# Patient Record
Sex: Female | Born: 1990 | Race: Black or African American | Hispanic: No | Marital: Single | Smoking: Current some day smoker
Health system: Southern US, Community
[De-identification: ages and names within clinical notes are randomized; demographics above are authoritative.]

## PROBLEM LIST (undated history)

## (undated) ENCOUNTER — Inpatient Hospital Stay (HOSPITAL_COMMUNITY): Payer: Self-pay

## (undated) DIAGNOSIS — N186 End stage renal disease: Secondary | ICD-10-CM

## (undated) DIAGNOSIS — E109 Type 1 diabetes mellitus without complications: Secondary | ICD-10-CM

## (undated) DIAGNOSIS — E7800 Pure hypercholesterolemia, unspecified: Secondary | ICD-10-CM

## (undated) DIAGNOSIS — B009 Herpesviral infection, unspecified: Secondary | ICD-10-CM

## (undated) DIAGNOSIS — G629 Polyneuropathy, unspecified: Secondary | ICD-10-CM

## (undated) DIAGNOSIS — O1490 Unspecified pre-eclampsia, unspecified trimester: Secondary | ICD-10-CM

## (undated) DIAGNOSIS — A64 Unspecified sexually transmitted disease: Secondary | ICD-10-CM

## (undated) HISTORY — DX: Herpesviral infection, unspecified: B00.9

## (undated) HISTORY — PX: WISDOM TOOTH EXTRACTION: SHX21

---
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
## 2010-12-20 ENCOUNTER — Encounter: Payer: Self-pay | Admitting: Emergency Medicine

## 2010-12-20 ENCOUNTER — Emergency Department (HOSPITAL_BASED_OUTPATIENT_CLINIC_OR_DEPARTMENT_OTHER)
Admission: EM | Admit: 2010-12-20 | Discharge: 2010-12-20 | Disposition: A | Discharge status: Other Acute Inpatient Hospital | Payer: Medicaid Other | Attending: Emergency Medicine | Admitting: Emergency Medicine

## 2010-12-20 ENCOUNTER — Inpatient Hospital Stay (HOSPITAL_COMMUNITY)
Admission: AD | Admit: 2010-12-20 | Discharge: 2010-12-22 | DRG: 639 | Disposition: A | Discharge status: Home / Self Care | Payer: Medicaid Other | Source: Other Acute Inpatient Hospital | Attending: Internal Medicine | Admitting: Internal Medicine

## 2010-12-20 DIAGNOSIS — Z5987 Material hardship due to limited financial resources, not elsewhere classified: Secondary | ICD-10-CM

## 2010-12-20 DIAGNOSIS — Z9119 Patient's noncompliance with other medical treatment and regimen: Secondary | ICD-10-CM

## 2010-12-20 DIAGNOSIS — E1010 Type 1 diabetes mellitus with ketoacidosis without coma: Principal | ICD-10-CM | POA: Diagnosis present

## 2010-12-20 DIAGNOSIS — Z91199 Patient's noncompliance with other medical treatment and regimen due to unspecified reason: Secondary | ICD-10-CM

## 2010-12-20 DIAGNOSIS — Z7901 Long term (current) use of anticoagulants: Secondary | ICD-10-CM

## 2010-12-20 DIAGNOSIS — R7401 Elevation of levels of liver transaminase levels: Secondary | ICD-10-CM | POA: Diagnosis present

## 2010-12-20 DIAGNOSIS — R110 Nausea: Secondary | ICD-10-CM | POA: Insufficient documentation

## 2010-12-20 DIAGNOSIS — E876 Hypokalemia: Secondary | ICD-10-CM | POA: Diagnosis not present

## 2010-12-20 DIAGNOSIS — E1110 Type 2 diabetes mellitus with ketoacidosis without coma: Secondary | ICD-10-CM | POA: Insufficient documentation

## 2010-12-20 DIAGNOSIS — R197 Diarrhea, unspecified: Secondary | ICD-10-CM | POA: Insufficient documentation

## 2010-12-20 DIAGNOSIS — Z598 Other problems related to housing and economic circumstances: Secondary | ICD-10-CM

## 2010-12-20 DIAGNOSIS — R7402 Elevation of levels of lactic acid dehydrogenase (LDH): Secondary | ICD-10-CM | POA: Diagnosis present

## 2010-12-20 DIAGNOSIS — R109 Unspecified abdominal pain: Secondary | ICD-10-CM | POA: Insufficient documentation

## 2010-12-20 HISTORY — DX: Type 1 diabetes mellitus without complications: E10.9

## 2010-12-20 LAB — URINE MICROSCOPIC-ADD ON

## 2010-12-20 LAB — BASIC METABOLIC PANEL
BUN: 18 mg/dL (ref 6–23)
BUN: 15 mg/dL (ref 6–23)
CO2: 15 mEq/L — ABNORMAL LOW (ref 19–32)
Calcium: 10.2 mg/dL (ref 8.4–10.5)
Calcium: 9.5 mg/dL (ref 8.4–10.5)
Calcium: 9 mg/dL (ref 8.4–10.5)
Creatinine, Ser: 0.9 mg/dL (ref 0.50–1.10)
GFR calc Af Amer: >60 mL/min (ref >60)
GFR calc Af Amer: >60 mL/min (ref >60)
GFR calc non Af Amer: >60 mL/min (ref >60)
GFR calc non Af Amer: >60 mL/min (ref >60)
GFR calc non Af Amer: >60 mL/min (ref >60)
Glucose, Bld: 137 mg/dL — ABNORMAL HIGH (ref 70–99)
Glucose, Bld: 214 mg/dL — ABNORMAL HIGH (ref 70–99)
Glucose, Bld: 183 mg/dL — ABNORMAL HIGH (ref 70–99)
Potassium: 4.3 mEq/L (ref 3.5–5.1)
Potassium: 3.5 mEq/L (ref 3.5–5.1)
Sodium: 132 mEq/L — ABNORMAL LOW (ref 135–145)
Sodium: 131 mEq/L — ABNORMAL LOW (ref 135–145)

## 2010-12-20 LAB — COMPREHENSIVE METABOLIC PANEL
ALT: 56 U/L — ABNORMAL HIGH (ref 0–35)
AST: 108 U/L — ABNORMAL HIGH (ref 0–37)
Albumin: 4.2 g/dL (ref 3.5–5.2)
CO2: 14 mEq/L — ABNORMAL LOW (ref 19–32)
Calcium: 10.8 mg/dL — ABNORMAL HIGH (ref 8.4–10.5)
Chloride: 92 mEq/L — ABNORMAL LOW (ref 96–112)
Creatinine, Ser: 1.1 mg/dL (ref 0.50–1.10)
GFR calc non Af Amer: >60 mL/min (ref >60)
Sodium: 134 mEq/L — ABNORMAL LOW (ref 135–145)

## 2010-12-20 LAB — URINALYSIS, ROUTINE W REFLEX MICROSCOPIC
Glucose, UA: >1000 mg/dL — AB
Ketones, ur: >80 mg/dL — AB
Nitrite: NEGATIVE
Specific Gravity, Urine: 1.029 (ref 1.005–1.030)
pH: 5 (ref 5.0–8.0)

## 2010-12-20 LAB — CBC
HCT: 45.9 % (ref 36.0–46.0)
MCH: 32.1 pg (ref 26.0–34.0)
MCV: 87.3 fL (ref 78.0–100.0)
Platelets: 344 10*3/uL (ref 150–400)
RBC: 5.26 MIL/uL — ABNORMAL HIGH (ref 3.87–5.11)
WBC: 7.9 10*3/uL (ref 4.0–10.5)

## 2010-12-20 LAB — MAGNESIUM: Magnesium: 1.7 mg/dL (ref 1.5–2.5)

## 2010-12-20 LAB — PHOSPHORUS
Phosphorus: 3 mg/dL (ref 2.3–4.6)
Phosphorus: 2.4 mg/dL (ref 2.3–4.6)

## 2010-12-20 LAB — GLUCOSE, CAPILLARY
Glucose-Capillary: 216 mg/dL — ABNORMAL HIGH (ref 70–99)
Glucose-Capillary: 196 mg/dL — ABNORMAL HIGH (ref 70–99)

## 2010-12-20 MED ORDER — SODIUM CHLORIDE 0.9 % IV BOLUS (SEPSIS)
1000.0000 mL | Freq: Once | INTRAVENOUS | Status: AC
  Administered 2010-12-20: 1000 mL via INTRAVENOUS

## 2010-12-20 MED ORDER — INSULIN REGULAR HUMAN 100 UNIT/ML IJ SOLN
INTRAMUSCULAR | Status: DC
  Filled 2010-12-20: qty 3

## 2010-12-20 NOTE — H&P (Signed)
Ann Robertson, Ann Robertson             ACCOUNT NO.:  [id number]  MEDICAL RECORD NO.:  [id number]  LOCATION:  [id number]                         FACILITY:  [facility]  PHYSICIAN:  Duncan Dull, MD     DATE OF BIRTH:  February 21, 1991  DATE OF ADMISSION:  12/20/2010 DATE OF DISCHARGE:                             HISTORY & PHYSICAL   CHIEF COMPLAINT:  Diabetic ketoacidosis.  HISTORY OF PRESENT ILLNESS:  This is a 20 year old African American woman with a history of diabetes diagnosed 12 years ago who presents today with elevated glucose and acidosis.  She says that due to financial issues, she is unable to consistently afford her insulin so she does not take it exactly as prescribed and her last dose had been at 2 in the afternoon yesterday, at which point she had not eaten anything afterwards.  She did miss her basal dose at night.  She woke up this morning with 10/10 pain in her stomach, feeling very sleepy.  She checked her blood sugar at that time, it was 599.  She is not sure what it tends to run because she does not check it very often.  She took 20 units of insulin and it came down to 445.  At the urging of her family member, she came into the hospital for further evaluation.  She does report a history of hospitalizations for DKA in the past although she said generally she is not this ill.  She says that she does think she might have a yeast infection, but that otherwise she has been feeling well prior to yesterday and denies any sick contacts.  When she came into the emergency room at [facility], the patient was started on a glucose stabilizer and given 2 L of normal saline.  Her tachycardia did improve and blood sugar came down in the less than 200 range.  The glucose stabilizer was stopped before she was transferred over.  It has risen up to about 257 on admission.  REVIEW OF SYSTEMS:  System review otherwise is negative except for that it hurts her stomach when she breathes  deeply.  PAST MEDICAL HISTORY:  Type 1 diabetes.  A C-section.  SOCIAL HISTORY:  The patient denies any drug or alcohol or tobacco use. She lives with her son.  She is not currently working.  FAMILY HISTORY:  Her mother has hypertension, diabetes, high cholesterol and has suffered an MI, stroke and aneurysm.  She has a brother with type 1 diabetes.  ALLERGIES:  She is allergic to some pain medication, that she cannot remember the name of, that gives her hives.  MEDICATIONS:  40 units of Lantus at bedtime, 12 units of Humalog at breakfast, 15 at lunch, and 15 at dinner, and as needed she takes a sliding scale of 0 and 7.  PHYSICAL EXAM:  VITAL SIGNS:  Not recorded at the outside hospital.  On presentation, she had a temperature of 98.5, pulse 146, blood pressure 138/94, respiratory rate 20, saturating 100% on room air. GENERAL:  This is a thin but well-nourished, well-developed, overall well-appearing young woman in no acute distress. HEENT:  Pupils equal, round, reactive to light.  Extraocular movements are intact.  She  has no conjunctivitis or scleral icterus.  She has moist mucous membranes.  No thrush. LUNGS:  Clear to auscultation bilaterally with only moderate respiratory effort. CARDIOVASCULAR:  Regular rate and rhythm, mildly tachycardic.  No murmurs, rubs or gallops. ABDOMEN:  Soft, nontender, nondistended.  Normal bowel sounds. EXTREMITIES:  Warm, well-perfused.  No cyanosis, clubbing or edema.  She has a bilateral calf tenderness which she says is chronic. NEUROLOGIC:  Grossly intact.  No focal deficits. SKIN:  No rash or skin breakdown.  LABORATORY DATA:  Most recent sodium 134, potassium 4.3, glucose 137, bicarb 19, BUN 18, creatinine 0.9 and calcium 10.2.  When she was admitted values of note were glucose 190, creatinine 1.1, and a calcium of 10.8.  CBC reveals white count 7.9, hemoglobin 16.9, hematocrit 45.9, platelets 344, phosphorus is 3.  UA was notable for  greater than a 1000 urine glucose, greater than 80 ketones, otherwise unremarkable so was her microscopic exam.  She was not pregnant.  Lipase was normal.  ASSESSMENT:  This is a 20 year old woman with a history of type 1 diabetes who presents with diabetic ketoacidosis in the context of noncompliance with her insulin regimen.  PLAN:  The patient will be back on a glucose stabilizer.  She still has an acidosis based on her bicarb of 19 and her glucose continues to rise off of the insulin pump.  We will check hemoglobin A1c to evaluate how her glucose management has been recently.  We will also check a VBG to make sure she is not profoundly acidotic.  She will continue on IV fluids.  Given that she is hungry and looks well and is not vomiting, she will be allowed to eat a carbohydrate-restricted diet.  We will arrange for her to meet with Social Work and the Diabetes Coordinator and hopefully something can be worked out so that she can afford to take her medication as she needs to as an outpatient.     Duncan Dull, MD     NB/MEDQ  D:  12/20/2010  T:  12/20/2010  Job:  [id number]  Electronically Signed by Duncan Dull M.D. on 12/20/2010 11:10:08 PM

## 2010-12-20 NOTE — ED Notes (Signed)
Pt states she has high blood sugar, 500's this am.  Pt is non-compliant with insulin regimen and diet.  Pt states she is trying to do better, but states she doesn't like taking her insulin.

## 2010-12-20 NOTE — ED Provider Notes (Signed)
History     Chief Complaint  Patient presents with  . Hyperglycemia  . Abdominal Pain  . Nausea   Patient is a 20 y.o. female presenting with abdominal pain. The history is provided by the patient.  Abdominal Pain The primary symptoms of the illness include abdominal pain and diarrhea. The primary symptoms of the illness do not include fever, shortness of breath, vomiting, dysuria, vaginal discharge or vaginal bleeding. The current episode started yesterday. The problem has not changed since onset. Symptoms associated with the illness do not include back pain. Significant associated medical issues include diabetes.  pt states hx iddm. In past day few diarrhea stools and occasional epigastric and mid abd cramping. No constant/focal abd pain. No fever or chills. States her blood sugars have been high. Did not take insulin yesterday. No vomiting. No cough or uri c/o. No headache. No dysuria or flank pain. No vaginal discharge or bleeding.   Past Medical History  Diagnosis Date  . Diabetes mellitus   . Juvenile diabetes mellitus   . Juvenile diabetes 2000    No past surgical history on file.  No family history on file.  History  Substance Use Topics  . Smoking status: Not on file  . Smokeless tobacco: Not on file  . Alcohol Use:     OB History    Grav Para Term Preterm Abortions TAB SAB Ect Mult Living                  Review of Systems  Constitutional: Negative for fever.  HENT: Negative for neck pain.   Eyes: Negative for redness.  Respiratory: Negative for cough and shortness of breath.   Cardiovascular: Negative for chest pain.  Gastrointestinal: Positive for abdominal pain and diarrhea. Negative for vomiting.  Genitourinary: Negative for dysuria, flank pain, vaginal bleeding and vaginal discharge.  Musculoskeletal: Negative for back pain.  Skin: Negative for rash.  Neurological: Negative for headaches.  Hematological: Does not bruise/bleed easily.    Psychiatric/Behavioral: Negative for behavioral problems.    Physical Exam  BP 138/94  Pulse 146  Temp(Src) 98.5 F (36.9 C) (Oral)  Resp 20  SpO2 100%  Physical Exam  Nursing note and vitals reviewed. Constitutional: She is oriented to person, place, and time.  HENT:  Head: Atraumatic.  Nose: Nose normal.  Mouth/Throat: Oropharynx is clear and moist.  Eyes: Conjunctivae are normal. Pupils are equal, round, and reactive to light. No scleral icterus.  Neck: Neck supple. No tracheal deviation present.       No stiffness or rigidiity  Cardiovascular: Intact distal pulses.  Exam reveals no gallop and no friction rub.   No murmur heard.      tachycardic  Pulmonary/Chest: Effort normal and breath sounds normal. No respiratory distress. She has no rales.  Abdominal: Soft. Normal appearance and bowel sounds are normal. She exhibits no distension and no mass. There is no tenderness. There is no rebound and no guarding.  Musculoskeletal: She exhibits no edema and no tenderness.  Neurological: She is alert and oriented to person, place, and time.  Skin: Skin is warm and dry. No rash noted. She is not diaphoretic.  Psychiatric: She has a normal mood and affect.    ED Course  Procedures  MDM Iv ns bolus. Labs.  Additional ivf. hco3 14. Glucose stabilizer/insulin gtt.  Recheck pt improved.  Discussed admit w pt. Pt agreeable, offered [facility]. Pt states lives in [place] and prefers [facility]. Discussed w [facility] at [facility] (  pts pcp w [facility], so unassigned in [place] system) - dr Deon Pilling accepts in transfer, requests step down bed.      Mirna Mires, MD 12/20/10 1245

## 2010-12-21 LAB — GLUCOSE, CAPILLARY
Glucose-Capillary: 324 mg/dL — ABNORMAL HIGH (ref 70–99)
Glucose-Capillary: 171 mg/dL — ABNORMAL HIGH (ref 70–99)
Glucose-Capillary: 179 mg/dL — ABNORMAL HIGH (ref 70–99)
Glucose-Capillary: 168 mg/dL — ABNORMAL HIGH (ref 70–99)
Glucose-Capillary: 148 mg/dL — ABNORMAL HIGH (ref 70–99)
Glucose-Capillary: 228 mg/dL — ABNORMAL HIGH (ref 70–99)
Glucose-Capillary: 114 mg/dL — ABNORMAL HIGH (ref 70–99)

## 2010-12-21 LAB — BASIC METABOLIC PANEL
BUN: 16 mg/dL (ref 6–23)
Chloride: 99 mEq/L (ref 96–112)
GFR calc Af Amer: >60 mL/min (ref >60)
GFR calc non Af Amer: >60 mL/min (ref >60)
Glucose, Bld: 124 mg/dL — ABNORMAL HIGH (ref 70–99)
Potassium: 3.6 mEq/L (ref 3.5–5.1)
Sodium: 132 mEq/L — ABNORMAL LOW (ref 135–145)

## 2010-12-22 LAB — CBC
HCT: 35.7 % — ABNORMAL LOW (ref 36.0–46.0)
Hemoglobin: 12.2 g/dL (ref 12.0–15.0)
MCH: 31.6 pg (ref 26.0–34.0)
MCHC: 34.2 g/dL (ref 30.0–36.0)
MCV: 92.5 fL (ref 78.0–100.0)
RBC: 3.86 MIL/uL — ABNORMAL LOW (ref 3.87–5.11)

## 2010-12-22 LAB — GLUCOSE, CAPILLARY
Glucose-Capillary: 45 mg/dL — ABNORMAL LOW (ref 70–99)
Glucose-Capillary: 195 mg/dL — ABNORMAL HIGH (ref 70–99)

## 2010-12-22 LAB — BASIC METABOLIC PANEL
BUN: 15 mg/dL (ref 6–23)
CO2: 23 mEq/L (ref 19–32)
Calcium: 8.6 mg/dL (ref 8.4–10.5)
Glucose, Bld: 118 mg/dL — ABNORMAL HIGH (ref 70–99)
Sodium: 138 mEq/L (ref 135–145)

## 2011-01-15 NOTE — Discharge Summary (Signed)
Ann Robertson, Ann Robertson NO.:  [id number]  MEDICAL RECORD NO.:  [id number]  LOCATION:  [id number]                         FACILITY:  [facility]  PHYSICIAN:  Julieta Bellini, MDDATE OF BIRTH:  June 09, 1990  DATE OF ADMISSION:  12/20/2010 DATE OF DISCHARGE:  12/22/2010                              DISCHARGE SUMMARY   ENDOCRINOLOGIST:  Dr. Peri Jefferson.  DISCHARGE DIAGNOSES: 1. Diabetes ketoacidosis with poor control, type 1 diabetes,     hemoglobin A1c 12.6. 2. Hypokalemia. 3. History of cesarean section. 4. Transaminitis.  DISCHARGE MEDICATIONS: 1. Multivitamins 1 tablet by mouth daily. 2. Humalog to use 15 three times a day as a meal coverage plus a     sliding scale insulin q.a.c. q.h.s., moderate same, no units if the     blood sugar is 7 to 120, 2 units if the blood sugar is 121 to 150,     3 units 151 to 200, 5 units 201 to 250, 8 units 251 to 300, 11     units 301 to 350 and 15 units  351 to 400 and above 400, she will     need to get in touch with Dr. Ronnald Ramp in order to receive further     recommendation and adjustment. 3. Lantus 35 units subcutaneously daily at bedtime.  DISPOSITION AND FOLLOWUP:  The patient had been discharged in stable and improved condition.  Currently not having any nausea, vomiting or abdominal pain.  She is tolerating p.o.'s pretty well and her blood sugars have been now stable.  The patient received instructions to follow a low carbohydrates and to take her medication as prescribed. She also had been now switched to [facility] in order to try to avoid not being able to get her medication due to financial issues and will follow with Dr. Ronnald Ramp on Thursday, July 26, at 8:40 a.m.  It will be important at that time to follow the patient's electrolytes to make sure that the potassium specifically continued to be in normal range after been repleted throughout this hospitalization and to have her blood sugar check and her insulin adjusted.   No procedures were performed during this hospitalization.  No consultations were made.  BRIEF HISTORY OF PRESENT ILLNESS:  The patient is a 20 year old African- American female with a history of diabetes type 1, diagnosed when she was 20 years old.  The patient came into the hospital with an elevated blood sugar acidosis and having some abdominal pain and nausea.  She reports 2 episodes of vomiting the day prior to admission and endorses that she had not been able to take her insulin due to financial problems.  For more details, please refer to dictation done by Dr. Duncan Dull on December 20, 2010.  LABORATORY DATA:  Pertinent laboratory data throughout this hospitalization include a CBC with differential on admission showing white blood cells of 7.9, hemoglobin 16.9, platelets 344,000.  A comprehensive metabolic panel with a sodium of 134, potassium 4.2, chloride 92, bicarb was 14.  Blood sugar was above 500 prior to being placed on glucose stabilizer.  Creatinine was 1.1, BUN 20.  Liver function tests demonstrated mild elevation of AST and ALT at 108 and  56, respectively.  Bilirubin 0.9, calcium 10.8, albumin 4.2, total protein 9.1.  Lipase was 11, phosphorus 3.0.  Urinary pregnancy test was negative.  Urinalysis and urine microscopy negative for infections. Magnesium 1.7.  MRSA PCR screening was negative.  Hemoglobin A1c 12.6. Multiple basic metabolic panels were done throughout her hospitalization while she was on a glucose stabilizer in order to adjust her blood sugar and electrolytes at discharge.  BMET demonstrated a sodium of 138, potassium 3.7, chloride 103, bicarb 23, glucose 118, BUN 15, creatinine 0.72, calcium 8.6.  CBC, white blood cells 4.0, hemoglobin 12.2, platelets 216,000.  HOSPITAL COURSE BY PROBLEM: 1. DKA due to uncontrolled diabetes secondary to noncompliance.  The     patient was admitted, placed on DKA protocol, following glucose     stabilizer and then  switched to long-acting insulin sliding scale     plus meal coverage.  Now that her blood sugar is better controlled,     she is going to be discharged with instructions to be compliant     with her medications with switch in pharmacy to try to help with     the financial issues, specifically her co-pay for the medications.     She had received a month supply of her insulin and she is going to     follow with Dr. Ronnald Ramp, endocrinologist, for further adjustment as     an outpatient.  She is no longer having any nausea, any vomiting.     She is tolerating p.o.'s without problem. 2. Hypokalemia secondary to the regimen of her DKA and also treatment.     She received repletion throughout this hospitalization and at     discharge, potassium was in the normal range. 3. Transaminitis.  Mild elevation of her liver function tests, most     likely also secondary to the DKA process.  The patient received     fluid resuscitation, not having any signs or symptoms that will     suggest any liver disease.  She will require LFTs to be checked up     as an outpatient. 4. Financial difficulties.  Avoiding medication compliance.  Case     manager/social worker got together with the patient and has switch     her to [facility] that can waive co-pay in order to try to make her     medication compliance better.  The patient has Medicaid and also     the prescription plan from Medicaid which made her not illegible     for indigent funds at the hospital.  At discharge, temperature     demonstrated to be 97.6, heart rate 84, respiratory rate 20, blood     pressure 120/79, oxygen saturation 99% on room air.  In general,     the patient in no acute distress.  Respiratory system is clear to     auscultation bilaterally.  Heart is regular rate and rhythm.  No     murmurs, gallops or rubs.  Abdomen is soft, nontender with positive     bowel sounds.  No distention.  Extremities without edema.  There is     a tattoo on  her right lower extremity.  Neurologic exam was     nonfocal.     Julieta Bellini, MD     CEM/MEDQ  D:  12/22/2010  T:  12/22/2010  Job:  [id number]  cc:   Dr. Peri Jefferson  Electronically Signed by Barton Dubois MD on 01/15/2011 05:42:55 PM

## 2011-05-20 ENCOUNTER — Encounter (HOSPITAL_BASED_OUTPATIENT_CLINIC_OR_DEPARTMENT_OTHER): Payer: Self-pay

## 2011-05-20 ENCOUNTER — Emergency Department (HOSPITAL_BASED_OUTPATIENT_CLINIC_OR_DEPARTMENT_OTHER)
Admission: EM | Admit: 2011-05-20 | Discharge: 2011-05-20 | Disposition: A | Discharge status: Home / Self Care | Payer: Medicaid Other | Attending: Emergency Medicine | Admitting: Emergency Medicine

## 2011-05-20 DIAGNOSIS — R21 Rash and other nonspecific skin eruption: Secondary | ICD-10-CM | POA: Insufficient documentation

## 2011-05-20 DIAGNOSIS — E119 Type 2 diabetes mellitus without complications: Secondary | ICD-10-CM | POA: Insufficient documentation

## 2011-05-20 DIAGNOSIS — N644 Mastodynia: Secondary | ICD-10-CM

## 2011-05-20 MED ORDER — OXYCODONE-ACETAMINOPHEN 5-325 MG PO TABS: 2.0000 | ORAL_TABLET | ORAL | Status: AC | PRN

## 2011-05-20 NOTE — ED Provider Notes (Signed)
History     CSN: [id number]  Arrival date & time 05/20/11  1702   First MD Initiated Contact with Patient 05/20/11 1757      Chief Complaint  Patient presents with  . Rash    (Consider location/radiation/quality/duration/timing/severity/associated sxs/prior treatment) Patient is a 20 y.o. female presenting with rash. The history is provided by the patient.  Rash  This is a new problem.   patient has had breast pain for last 3-4 months. She states that her breasts are also larger. She states it is not associated with her period. She states she now has a rash below the right breast. She states there goes like there is a fullness in the right breast. She states she saw her primary care Dr. who she says blew her off. No fevers. No nipple discharge. No weight loss. No cough. She states she can't be pregnant because she is having periods.  Past Medical History  Diagnosis Date  . Diabetes mellitus   . Juvenile diabetes mellitus   . Juvenile diabetes 2000    Past Surgical History  Procedure Date  . Cesarean section     No family history on file.  History  Substance Use Topics  . Smoking status: Never Smoker   . Smokeless tobacco: Not on file  . Alcohol Use: No    OB History    Grav Para Term Preterm Abortions TAB SAB Ect Mult Living                  Review of Systems  Respiratory: Negative for shortness of breath.   Cardiovascular: Negative for chest pain.  Genitourinary: Negative for pelvic pain.  Skin: Positive for rash.    Allergies  Review of patient's allergies indicates no known allergies.  Home Medications   Current Outpatient Rx  Name Route Sig Dispense Refill  . ACETAMINOPHEN 500 MG PO TABS Oral Take 1,000 mg by mouth every 6 (six) hours as needed. For pain     . INSULIN GLARGINE 100 UNIT/ML [place] SOLN Subcutaneous Inject 40 Units into the skin at bedtime.     . INSULIN LISPRO (HUMAN) 100 UNIT/ML  SOLN Subcutaneous Inject 3-13 Units into the skin 3  (three) times daily before meals. According to sliding scale: if blood sugar is 70-90, 3 units; 91-130, 5 units; 131-150, 6 units; 151-200, 7 units; 201-300, 9 units; 301-350, 10 units; 351-400, 11 units; 401-450, 12 units; greater than 450, 13 units.    . OXYCODONE-ACETAMINOPHEN 5-325 MG PO TABS Oral Take 2 tablets by mouth every 4 (four) hours as needed for pain. 15 tablet 0    BP 130/85  Pulse 83  Temp(Src) 99.4 F (37.4 C) (Oral)  Resp 16  Ht 5\' 5"  (1.651 m)  Wt 133 lb (60.328 kg)  BMI 22.13 kg/m2  SpO2 100%  LMP 05/19/2011  Physical Exam  Constitutional: She appears well-developed and well-nourished.  HENT:  Head: Normocephalic.  Eyes: Pupils are equal, round, and reactive to light.  Cardiovascular: Normal rate and regular rhythm.   Pulmonary/Chest: Effort normal and breath sounds normal.       Bilateral breast tenderness. Skin the left breast appears normal. No clear masses, although there is somewhat dense breast tissue. No nipple discharge. Skin on the right breast shows a couple small approximately 3 mm red areas inferiorly. There is fullness in the breast, worse on the right side. Mild tenderness in the right axilla, but no lymphadenopathy palpated.    ED Course  Procedures (including  critical care time)   Labs Reviewed  PREGNANCY, URINE   No results found.   1. Breast pain in female       MDM  Patient had breast pain for last 3-4 months. She states it hurts on both sides. There is somewhat of a fullness in the right breast. Little rash inferiorly. She states she saw her primary care doctor but wouldn't do anything and she was someone different. She's given information for the doctors in this building. She'll be discharged home for them to arrange for mammogram. She was given pain medications        Jasper Riling. Alvino Chapel, MD 05/20/11 [phone]

## 2011-05-20 NOTE — ED Notes (Signed)
Breast pain, lump to right breast x 3-4 months-rash to area today

## 2011-06-21 ENCOUNTER — Encounter (HOSPITAL_BASED_OUTPATIENT_CLINIC_OR_DEPARTMENT_OTHER): Payer: Self-pay | Admitting: Family Medicine

## 2011-06-21 ENCOUNTER — Emergency Department (HOSPITAL_BASED_OUTPATIENT_CLINIC_OR_DEPARTMENT_OTHER)
Admission: EM | Admit: 2011-06-21 | Discharge: 2011-06-21 | Disposition: A | Discharge status: Home / Self Care | Payer: Medicaid Other | Attending: Emergency Medicine | Admitting: Emergency Medicine

## 2011-06-21 DIAGNOSIS — F3289 Other specified depressive episodes: Secondary | ICD-10-CM | POA: Insufficient documentation

## 2011-06-21 DIAGNOSIS — E7800 Pure hypercholesterolemia, unspecified: Secondary | ICD-10-CM | POA: Insufficient documentation

## 2011-06-21 DIAGNOSIS — F329 Major depressive disorder, single episode, unspecified: Secondary | ICD-10-CM

## 2011-06-21 DIAGNOSIS — E119 Type 2 diabetes mellitus without complications: Secondary | ICD-10-CM | POA: Insufficient documentation

## 2011-06-21 HISTORY — DX: Pure hypercholesterolemia, unspecified: E78.00

## 2011-06-21 HISTORY — DX: Polyneuropathy, unspecified: G62.9

## 2011-06-21 LAB — CBC
HCT: 37.1 % (ref 36.0–46.0)
MCH: 30.4 pg (ref 26.0–34.0)
MCHC: 34.8 g/dL (ref 30.0–36.0)
MCV: 87.3 fL (ref 78.0–100.0)
Platelets: 312 10*3/uL (ref 150–400)
RDW: 11.2 % — ABNORMAL LOW (ref 11.5–15.5)
WBC: 7.4 10*3/uL (ref 4.0–10.5)

## 2011-06-21 LAB — DIFFERENTIAL
Basophils Absolute: 0 10*3/uL (ref 0.0–0.1)
Basophils Relative: 0 % (ref 0–1)
Eosinophils Absolute: 0.3 10*3/uL (ref 0.0–0.7)
Eosinophils Relative: 4 % (ref 0–5)
Lymphocytes Relative: 31 % (ref 12–46)
Monocytes Absolute: 0.4 10*3/uL (ref 0.1–1.0)

## 2011-06-21 LAB — URINALYSIS, ROUTINE W REFLEX MICROSCOPIC
Hgb urine dipstick: NEGATIVE
Nitrite: NEGATIVE
Specific Gravity, Urine: 1.04 — ABNORMAL HIGH (ref 1.005–1.030)
Urobilinogen, UA: 1 mg/dL (ref 0.0–1.0)
pH: 6 (ref 5.0–8.0)

## 2011-06-21 LAB — RAPID URINE DRUG SCREEN, HOSP PERFORMED
Benzodiazepines: NOT DETECTED
Opiates: NOT DETECTED

## 2011-06-21 LAB — PREGNANCY, URINE: Preg Test, Ur: NEGATIVE

## 2011-06-21 LAB — COMPREHENSIVE METABOLIC PANEL
ALT: 6 U/L (ref 0–35)
AST: 11 U/L (ref 0–37)
CO2: 27 mEq/L (ref 19–32)
Calcium: 9.1 mg/dL (ref 8.4–10.5)
Creatinine, Ser: 0.7 mg/dL (ref 0.50–1.10)
GFR calc non Af Amer: >90 mL/min (ref >90)
Sodium: 135 mEq/L (ref 135–145)
Total Protein: 7.3 g/dL (ref 6.0–8.3)

## 2011-06-21 LAB — ETHANOL: Alcohol, Ethyl (B): <11 mg/dL (ref 0–11)

## 2011-06-21 LAB — URINE MICROSCOPIC-ADD ON

## 2011-06-21 MED ORDER — IBUPROFEN 800 MG PO TABS
800.0000 mg | ORAL_TABLET | Freq: Once | ORAL | Status: AC
  Administered 2011-06-21: 800 mg via ORAL
  Filled 2011-06-21: qty 1

## 2011-06-21 MED ORDER — FLUOXETINE HCL 20 MG PO TABS: 20.0000 mg | ORAL_TABLET | Freq: Every day | ORAL | Status: DC

## 2011-06-21 NOTE — ED Notes (Signed)
Pt tearful and sts she has been feeling depressed 2-3 wks. Pt denies SI, HI and denies h/o same.

## 2011-06-21 NOTE — ED Provider Notes (Signed)
History   This chart was scribed for Ezequiel Essex, MD by Malen Gauze. The patient was seen in room MH05/MH05 and the patient's care was started at 7:00PM.    CSN: [id number]  Arrival date & time 06/21/11  1831   First MD Initiated Contact with Patient 06/21/11 1854      Chief Complaint  Patient presents with  . Depression    (Consider location/radiation/quality/duration/timing/severity/associated sxs/prior treatment) HPI Ann Robertson is a 21 y.o. female who presents to the Emergency Department complaining of persistent, severe depression with an onset 3 weeks ago. Female guardian from pt's church brought her in with pt's consent. Guardian states that pt called her crying and saying she was hopeless and depressed. Today, when guardian suggested going to the ED, pt immediately said yes to the idea, as long as she was not admitted and could go home afterwards. Pt has a nml appetite but has not slept or perform in nml daily activities. However, there is no suicidal or homicidal ideation, alcohol abuse, or meds taken for depression. Pt has a HA from crying. Pt is also diabetic and takes insulin shots. She is on a new medication (gabapentin) which pt claims is making her more depressed. Blood sugar levels are nml. Pt also has neuropathy, but no other pertinent medical problems.  Past Medical History  Diagnosis Date  . Diabetes mellitus   . Juvenile diabetes mellitus   . Juvenile diabetes 2000  . Neuropathy   . High cholesterol     Past Surgical History  Procedure Date  . Cesarean section     No family history on file.  History  Substance Use Topics  . Smoking status: Never Smoker   . Smokeless tobacco: Not on file  . Alcohol Use: No    Pt has a son in pre-school.  OB History    Grav Para Term Preterm Abortions TAB SAB Ect Mult Living                  Review of Systems 10 Systems reviewed and are negative for acute change except as noted in the HPI.  Allergies    Review of patient's allergies indicates no known allergies.  Home Medications   Current Outpatient Rx  Name Route Sig Dispense Refill  . ACETAMINOPHEN 500 MG PO TABS Oral Take 1,000 mg by mouth every 6 (six) hours as needed. For pain     . GABAPENTIN 300 MG PO CAPS Oral Take 300 mg by mouth 3 (three) times daily.    . INSULIN GLARGINE 100 UNIT/ML [place] SOLN Subcutaneous Inject 19 Units into the skin daily.     . INSULIN LISPRO (HUMAN) 100 UNIT/ML [place] SOLN Subcutaneous Inject 3-13 Units into the skin 3 (three) times daily before meals. According to sliding scale: if blood sugar is 70-90, 3 units; 91-130, 5 units; 131-150, 6 units; 151-200, 7 units; 201-300, 9 units; 301-350, 10 units; 351-400, 11 units; 401-450, 12 units; greater than 450, 13 units.    Marland Kitchen PRAVASTATIN SODIUM 20 MG PO TABS Oral Take 20 mg by mouth at bedtime.      BP 118/80  Pulse 93  Temp(Src) 98 F (36.7 C) (Oral)  Resp 16  SpO2 100%  LMP 06/21/2011  Physical Exam  Constitutional: She is oriented to person, place, and time. She appears well-developed and well-nourished.  HENT:  Head: Normocephalic and atraumatic.  Right Ear: External ear normal.  Left Ear: External ear normal.  Eyes: Conjunctivae and EOM are normal. Pupils  are equal, round, and reactive to light. No scleral icterus.  Neck: Normal range of motion. Neck supple. No thyromegaly present.  Cardiovascular: Normal rate, regular rhythm and normal heart sounds.  Exam reveals no gallop and no friction rub.   No murmur heard. Pulmonary/Chest: Effort normal and breath sounds normal. No stridor. She has no wheezes. She has no rales. She exhibits no tenderness.  Abdominal: Soft. She exhibits no distension. There is no tenderness. There is no rebound.  Musculoskeletal: Normal range of motion. She exhibits no edema.  Lymphadenopathy:    She has no cervical adenopathy.  Neurological: She is alert and oriented to person, place, and time. Coordination normal.  Skin:  Skin is warm and dry. No rash noted. No erythema.  Psychiatric: She is withdrawn. She exhibits a depressed mood. She expresses no homicidal and no suicidal ideation. She expresses no suicidal plans and no homicidal plans.    ED Course  Procedures (including critical care time) DIAGNOSTIC STUDIES: Oxygen Saturation is 100% on room air, normal by my interpretation.    COORDINATION OF CARE:  9:08PM - recheck; checking on status of pt  Results for orders placed during the hospital encounter of 06/21/11  CBC      Component Value Range   WBC 7.4  4.0 - 10.5 (K/uL)   RBC 4.25  3.87 - 5.11 (MIL/uL)   Hemoglobin 12.9  12.0 - 15.0 (g/dL)   HCT 37.1  36.0 - 46.0 (%)   MCV 87.3  78.0 - 100.0 (fL)   MCH 30.4  26.0 - 34.0 (pg)   MCHC 34.8  30.0 - 36.0 (g/dL)   RDW 11.2 (*) 11.5 - 15.5 (%)   Platelets 312  150 - 400 (K/uL)  DIFFERENTIAL      Component Value Range   Neutrophils Relative 59  43 - 77 (%)   Neutro Abs 4.4  1.7 - 7.7 (K/uL)   Lymphocytes Relative 31  12 - 46 (%)   Lymphs Abs 2.3  0.7 - 4.0 (K/uL)   Monocytes Relative 5  3 - 12 (%)   Monocytes Absolute 0.4  0.1 - 1.0 (K/uL)   Eosinophils Relative 4  0 - 5 (%)   Eosinophils Absolute 0.3  0.0 - 0.7 (K/uL)   Basophils Relative 0  0 - 1 (%)   Basophils Absolute 0.0  0.0 - 0.1 (K/uL)  COMPREHENSIVE METABOLIC PANEL      Component Value Range   Sodium 135  135 - 145 (mEq/L)   Potassium 4.5  3.5 - 5.1 (mEq/L)   Chloride 100  96 - 112 (mEq/L)   CO2 27  19 - 32 (mEq/L)   Glucose, Bld 342 (*) 70 - 99 (mg/dL)   BUN 12  6 - 23 (mg/dL)   Creatinine, Ser 0.70  0.50 - 1.10 (mg/dL)   Calcium 9.1  8.4 - 10.5 (mg/dL)   Total Protein 7.3  6.0 - 8.3 (g/dL)   Albumin 3.8  3.5 - 5.2 (g/dL)   AST 11  0 - 37 (U/L)   ALT 6  0 - 35 (U/L)   Alkaline Phosphatase 78  39 - 117 (U/L)   Total Bilirubin 0.4  0.3 - 1.2 (mg/dL)   GFR calc non Af Amer >90  >90 (mL/min)   GFR calc Af Amer >90  >90 (mL/min)  URINALYSIS, ROUTINE W REFLEX MICROSCOPIC       Component Value Range   Color, Urine YELLOW  YELLOW    APPearance CLEAR  CLEAR  Specific Gravity, Urine 1.040 (*) 1.005 - 1.030    pH 6.0  5.0 - 8.0    Glucose, UA >1000 (*) NEGATIVE (mg/dL)   Hgb urine dipstick NEGATIVE  NEGATIVE    Bilirubin Urine NEGATIVE  NEGATIVE    Ketones, ur NEGATIVE  NEGATIVE (mg/dL)   Protein, ur NEGATIVE  NEGATIVE (mg/dL)   Urobilinogen, UA 1.0  0.0 - 1.0 (mg/dL)   Nitrite NEGATIVE  NEGATIVE    Leukocytes, UA NEGATIVE  NEGATIVE   PREGNANCY, URINE      Component Value Range   Preg Test, Ur NEGATIVE    ETHANOL      Component Value Range   Alcohol, Ethyl (B) <11  0 - 11 (mg/dL)  URINE RAPID DRUG SCREEN (HOSP PERFORMED)      Component Value Range   Opiates NONE DETECTED  NONE DETECTED    Cocaine NONE DETECTED  NONE DETECTED    Benzodiazepines NONE DETECTED  NONE DETECTED    Amphetamines NONE DETECTED  NONE DETECTED    Tetrahydrocannabinol NONE DETECTED  NONE DETECTED    Barbiturates NONE DETECTED  NONE DETECTED   URINE MICROSCOPIC-ADD ON      Component Value Range   Squamous Epithelial / LPF RARE  RARE    Bacteria, UA RARE  RARE       No results found.   No diagnosis found.    MDM  3 weeks of depression, tearfulness, hopelessness, decreased appetite and decreased sleep. Not currently treated for depression. Denies suicidal or homicidal ideation.  Gradual onset headache "from crying".  Patient seen and evaluated by mobile crisis team. She signed a safety contract and is safe to go home with her family. She has no suicidal thoughts at this time. We'll start her on an antidepressant. Resources given to family.  I personally performed the services described in this documentation, which was scribed in my presence.  The recorded information has been reviewed and considered.       Ezequiel Essex, MD 06/21/11 2131

## 2012-10-24 ENCOUNTER — Inpatient Hospital Stay (HOSPITAL_COMMUNITY)
Admission: AD | Admit: 2012-10-24 | Discharge: 2012-10-24 | Disposition: A | Discharge status: Home / Self Care | Payer: Medicaid Other | Source: Ambulatory Visit | Attending: Obstetrics & Gynecology | Admitting: Obstetrics & Gynecology

## 2012-10-24 ENCOUNTER — Inpatient Hospital Stay (HOSPITAL_COMMUNITY): Payer: Medicaid Other

## 2012-10-24 ENCOUNTER — Encounter (HOSPITAL_COMMUNITY): Payer: Self-pay | Admitting: *Deleted

## 2012-10-24 DIAGNOSIS — O209 Hemorrhage in early pregnancy, unspecified: Secondary | ICD-10-CM | POA: Insufficient documentation

## 2012-10-24 LAB — CBC
Hemoglobin: 12 g/dL (ref 12.0–15.0)
Platelets: 300 10*3/uL (ref 150–400)
RBC: 3.92 MIL/uL (ref 3.87–5.11)
WBC: 5.6 10*3/uL (ref 4.0–10.5)

## 2012-10-24 LAB — WET PREP, GENITAL
Trich, Wet Prep: NONE SEEN
Yeast Wet Prep HPF POC: NONE SEEN

## 2012-10-24 LAB — ABO/RH: ABO/RH(D): O POS

## 2012-10-24 NOTE — MAU Note (Signed)
Pt denies pain, pt states she had bleeding earlier today spotting when she wiped

## 2012-10-24 NOTE — MAU Note (Signed)
Pt tearful, stated if I lose this pregnancy, I am going to feel like I killed my baby.  Explained to pt that there is nothing she could do to make this happen or to save a pregnancy, it is completely beyond her control, happens 1 in 4 pregnancies.  Plan discussed.  Need for blood work explained.  People can have bleeding in a preg and still be pregnant.

## 2012-10-24 NOTE — MAU Provider Note (Signed)
History     CSN: [id number]  Arrival date and time: 10/24/12 1621   First Provider Initiated Contact with Patient 10/24/12 1853      Chief Complaint  Patient presents with  . Threatened Miscarriage   HPI Ann Robertson is a 22 y.o. G2P0 at [redacted]w[redacted]d who presents to MAU today with complaint of vaginal bleeding. The patient was seen at Urgent care earlier today after passing a "large clot" yesterday. She states that she had +UPT there and was told to come for further evaluation. The patient denies abdominal pain, vaginal discharge, N/V, UTI symptoms, fever or dizziness.   OB History   Grav Para Term Preterm Abortions TAB SAB Ect Mult Living   2               Past Medical History  Diagnosis Date  . Diabetes mellitus   . Juvenile diabetes mellitus   . Juvenile diabetes 2000  . Neuropathy   . High cholesterol     Past Surgical History  Procedure Laterality Date  . Cesarean section      Family History  Problem Relation Age of Onset  . Stroke Mother   . Hypertension Mother   . Heart disease Mother   . Kidney disease Mother   . Hyperlipidemia Father   . Diabetes Father     History  Substance Use Topics  . Smoking status: Never Smoker   . Smokeless tobacco: Not on file  . Alcohol Use: No    Allergies: No Known Allergies  No prescriptions prior to admission    Review of Systems  Constitutional: Negative for fever and malaise/fatigue.  Gastrointestinal: Negative for nausea, vomiting and abdominal pain.  Genitourinary: Negative for dysuria, urgency and frequency.       + vaginal bleeding Neg - vaginal discharge  Neurological: Negative for dizziness and loss of consciousness.   Physical Exam   Blood pressure 125/88, pulse 90, temperature 98.5 F (36.9 C), temperature source Oral, resp. rate 16, height 5\' 3"  (1.6 m), weight 135 lb (61.236 kg), last menstrual period 09/02/2012.  Physical Exam  Constitutional: She is oriented to person, place, and time. She  appears well-developed and well-nourished. No distress.  HENT:  Head: Normocephalic and atraumatic.  Cardiovascular: Normal rate.   Respiratory: Effort normal.  GI: Soft. She exhibits no distension and no mass. There is no tenderness. There is no rebound and no guarding.  Genitourinary: Uterus is enlarged. Uterus is not tender. Cervix exhibits no motion tenderness, no discharge and no friability. Right adnexum displays no mass and no tenderness. Left adnexum displays no mass and no tenderness. There is bleeding (dark brown bleeding in the vagina) around the vagina. Vaginal discharge (small amount of brown discharge noted) found.  Neurological: She is alert and oriented to person, place, and time.  Skin: Skin is warm and dry. No erythema.  Psychiatric: She has a normal mood and affect.   Results for orders placed during the hospital encounter of 10/24/12 (from the past 24 hour(s))  HCG, QUANTITATIVE, PREGNANCY     Status: Abnormal   Collection Time    10/24/12  5:10 PM      Result Value Range   hCG, Beta Chain, Quant, S 472 (*) <5 mIU/mL  CBC     Status: Abnormal   Collection Time    10/24/12  5:10 PM      Result Value Range   WBC 5.6  4.0 - 10.5 K/uL   RBC 3.92  3.87 -  5.11 MIL/uL   Hemoglobin 12.0  12.0 - 15.0 g/dL   HCT 34.4 (*) 36.0 - 46.0 %   MCV 87.8  78.0 - 100.0 fL   MCH 30.6  26.0 - 34.0 pg   MCHC 34.9  30.0 - 36.0 g/dL   RDW 11.9  11.5 - 15.5 %   Platelets 300  150 - 400 K/uL  ABO/RH     Status: None   Collection Time    10/24/12  5:10 PM      Result Value Range   ABO/RH(D) O POS    WET PREP, GENITAL     Status: Abnormal   Collection Time    10/24/12  6:55 PM      Result Value Range   Yeast Wet Prep HPF POC NONE SEEN  NONE SEEN   Trich, Wet Prep NONE SEEN  NONE SEEN   Clue Cells Wet Prep HPF POC NONE SEEN  NONE SEEN   WBC, Wet Prep HPF POC FEW (*) NONE SEEN   US Ob Comp Less 14 Wks  10/24/2012   *RADIOLOGY REPORT*  Clinical Data: Bleeding for 2 days.  Pregnant.   Beta HCG equal 472  OBSTETRIC <14 WK [place] AND TRANSVAGINAL OB US  Technique:  Both transabdominal and transvaginal ultrasound examinations were performed for complete evaluation of the gestation as well as the maternal uterus, adnexal regions, and pelvic cul-de-sac.  Transvaginal technique was performed to assess early pregnancy.  Comparison:  None.  Intrauterine gestational sac:  Not identified Yolk sac: Not identified Embryo: Not identified  Maternal uterus/adnexae: The ovaries and uterus are normal.  Small free fluid in the posterior cul-de-sac.  IMPRESSION:  1.  No intrauterine gestational sac identified.  2.  Small amount free fluid. 3.  Differential considerations include early intrauterine pregnancy , ectopic pregnancy, and spontaneous abortion.   Original Report Authenticated By: Suzy Bouchard, M.D.   US Ob Transvaginal  10/24/2012   *RADIOLOGY REPORT*  Clinical Data: Bleeding for 2 days.  Pregnant.  Beta HCG equal 472  OBSTETRIC <14 WK [place] AND TRANSVAGINAL OB US  Technique:  Both transabdominal and transvaginal ultrasound examinations were performed for complete evaluation of the gestation as well as the maternal uterus, adnexal regions, and pelvic cul-de-sac.  Transvaginal technique was performed to assess early pregnancy.  Comparison:  None.  Intrauterine gestational sac:  Not identified Yolk sac: Not identified Embryo: Not identified  Maternal uterus/adnexae: The ovaries and uterus are normal.  Small free fluid in the posterior cul-de-sac.  IMPRESSION:  1.  No intrauterine gestational sac identified.  2.  Small amount free fluid. 3.  Differential considerations include early intrauterine pregnancy , ectopic pregnancy, and spontaneous abortion.   Original Report Authenticated By: Suzy Bouchard, M.D.    MAU Course  Procedures None  MDM Wet prep, GC/Chlamydia, quant hCG, ABO/Rh, CBC and [place] today  Assessment and Plan  A: Positive pregnancy test Bleeding in early pregnancy  P: Discharge  home Patient to return to MAU in 48 hours for repeat quant hCG Bleeding precautions discussed Patient to return to MAU sooner as needed  Farris Has, PA-C  10/24/2012, 8:38 PM

## 2012-10-24 NOTE — MAU Note (Addendum)
Was at urgent care today, found out pregnant.  Passed a blood clot - kind of tissue like. (has it with her).  Has been bleeding the past 3 days.

## 2012-10-25 LAB — GC/CHLAMYDIA PROBE AMP: GC Probe RNA: NEGATIVE

## 2012-10-26 ENCOUNTER — Encounter (HOSPITAL_COMMUNITY): Payer: Self-pay

## 2012-10-26 ENCOUNTER — Inpatient Hospital Stay (HOSPITAL_COMMUNITY)
Admission: AD | Admit: 2012-10-26 | Discharge: 2012-10-26 | Disposition: A | Discharge status: Home / Self Care | Payer: Medicaid Other | Source: Ambulatory Visit | Attending: Family Medicine | Admitting: Family Medicine

## 2012-10-26 DIAGNOSIS — O209 Hemorrhage in early pregnancy, unspecified: Secondary | ICD-10-CM | POA: Insufficient documentation

## 2012-10-26 DIAGNOSIS — O039 Complete or unspecified spontaneous abortion without complication: Secondary | ICD-10-CM

## 2012-10-26 NOTE — MAU Provider Note (Signed)
Chart reviewed and agree with management and plan.  

## 2012-10-26 NOTE — MAU Note (Signed)
Patient to MAU for repeat BHCG. Patient denies any pain but has a small amount of dark blood today.

## 2012-10-26 NOTE — MAU Provider Note (Signed)
  History     CSN: [id number]  Arrival date and time: 10/26/12 1542   None     Chief Complaint  Patient presents with  . Follow-up   HPI Pt is here for f/u BetaHCG.  She was initially seen 10/24/2012 with bleeding in pregnancy.  Her ultrasound showed no gestation.  Her HCG was 472.  Pt has some continued bleeding today.  Her HCG has dropped to 191. Pt is O positive   Past Medical History  Diagnosis Date  . Diabetes mellitus   . Juvenile diabetes mellitus   . Juvenile diabetes 2000  . Neuropathy   . High cholesterol     Past Surgical History  Procedure Laterality Date  . Cesarean section      Family History  Problem Relation Age of Onset  . Stroke Mother   . Hypertension Mother   . Heart disease Mother   . Kidney disease Mother   . Hyperlipidemia Father   . Diabetes Father     History  Substance Use Topics  . Smoking status: Never Smoker   . Smokeless tobacco: Not on file  . Alcohol Use: No    Allergies: No Known Allergies  Prescriptions prior to admission  Medication Sig Dispense Refill  . insulin glargine (LANTUS) 100 UNIT/ML injection Inject 23 Units into the skin at bedtime.       . insulin lispro (HUMALOG) 100 UNIT/ML injection Inject 3-13 Units into the skin 3 (three) times daily before meals. According to sliding scale: Breakfast: if blood sugar is 70-90, 4 units; 91-130, 6 units; 131-150, 7 units; 151-200, 8 units; 201-300, 10 units; 301-350, 11 units; 351-400, 12 units; 401-450, 13 units; greater than 450, 14 units. Lunch: 1-9 units.  Supper: 4-15 units based on blood sugars.  Nighttime: 0-5 units.        Review of Systems  Gastrointestinal: Positive for abdominal pain. Negative for nausea, vomiting, diarrhea and constipation.       Mild intermittent abdominal pain, much less than previous   Physical Exam   Blood pressure 119/80, pulse 100, temperature 99.3 F (37.4 C), temperature source Oral, resp. rate 16, last menstrual period 09/02/2012, SpO2  100.00%.  Physical Exam  Vitals reviewed. Constitutional: She is oriented to person, place, and time. She appears well-developed and well-nourished.  HENT:  Head: Normocephalic.  Eyes: Pupils are equal, round, and reactive to light.  Neck: Normal range of motion. Neck supple.  Cardiovascular: Normal rate.   Respiratory: Effort normal.  GI: Soft.  Musculoskeletal: Normal range of motion.  Neurological: She is alert and oriented to person, place, and time.  Skin: Skin is warm and dry.  Psychiatric: She has a normal mood and affect.    MAU Course  Procedures Results for orders placed during the hospital encounter of 10/26/12 (from the past 24 hour(s))  HCG, QUANTITATIVE, PREGNANCY     Status: Abnormal   Collection Time    10/26/12  3:59 PM      Result Value Range   hCG, Beta Chain, Quant, S 191 (*) <5 mIU/mL    Assessment and Plan  Anembryonic pregnancy F/u in 1 week for repeat Beta HCG  Ann Robertson 10/26/2012, 5:38 PM

## 2012-12-18 ENCOUNTER — Inpatient Hospital Stay (HOSPITAL_COMMUNITY)
Admission: AD | Admit: 2012-12-18 | Discharge: 2012-12-18 | Disposition: A | Discharge status: Home / Self Care | Payer: Medicaid Other | Source: Ambulatory Visit | Attending: Obstetrics & Gynecology | Admitting: Obstetrics & Gynecology

## 2012-12-18 ENCOUNTER — Inpatient Hospital Stay (HOSPITAL_COMMUNITY): Payer: Medicaid Other

## 2012-12-18 ENCOUNTER — Encounter (HOSPITAL_COMMUNITY): Payer: Self-pay | Admitting: Medical

## 2012-12-18 DIAGNOSIS — Z3201 Encounter for pregnancy test, result positive: Secondary | ICD-10-CM | POA: Insufficient documentation

## 2012-12-18 DIAGNOSIS — Z3490 Encounter for supervision of normal pregnancy, unspecified, unspecified trimester: Secondary | ICD-10-CM

## 2012-12-18 MED ORDER — PRENATAL COMPLETE 14-0.4 MG PO TABS: 1.0000 | ORAL_TABLET | Freq: Every day | ORAL | Status: DC

## 2012-12-18 NOTE — MAU Provider Note (Signed)
[name] is a 22 y.o. G2P0 at Unknown who presents to MAU today for UPT. The patient was diagnosed with blighted ovum on 10/26/12 and did not follow-up as instructed to follow quant hCGs to normal. Patient was seen at PCP this week and told that her UPT was positive and that she had a quant hCG of 300 per patient.   BP 112/66  Pulse 96  Temp(Src) 97.2 F (36.2 C) (Oral)  Resp 16  Ht 5' 4.25" (1.632 m)  Wt 130 lb 4 oz (59.081 kg)  BMI 22.18 kg/m2 GENERAL: Well-developed, well-nourished female in no acute distress.  HEENT: Normocephalic, atraumatic.   LUNGS: Effort normal HEART: Regular rate  SKIN: Warm, dry and without erythema PSYCH: Normal mood and affect  Results for orders placed during the hospital encounter of 12/18/12 (from the past 24 hour(s))  POCT PREGNANCY, URINE     Status: Abnormal   Collection Time    12/18/12 12:40 PM      Result Value Range   Preg Test, Ur POSITIVE (*) NEGATIVE  HCG, QUANTITATIVE, PREGNANCY     Status: Abnormal   Collection Time    12/18/12  1:07 PM      Result Value Range   hCG, Beta Chain, Quant, S 35233 (*) <5 mIU/mL      A: IUP at 7w 1d with normal FHR  P: Discharge home Pregnancy confirmation letter given Patient referred to [facility] for HR OB clinic because of DM First trimester warning signs reviewed Patient may return to MAU as needed  Farris Has, PA-C 12/18/2012 2:38 PM

## 2012-12-18 NOTE — MAU Note (Signed)
Pt states was seen here and told she had blighted ovum. States was seen here 11/12/2012. RN told pt we have no records past 10/26/2012.

## 2012-12-18 NOTE — MAU Note (Signed)
Pt went to her primary MD office and was told she was still pregnant, and that her bhcg was in the 300's. Was seen at [facility] in [place]. Denies pain or bleeding. Is here to determine is this is same pregnancy or if this is a new pregnancy.

## 2012-12-21 LAB — OB RESULTS CONSOLE GC/CHLAMYDIA
Chlamydia: NEGATIVE
Gonorrhea: NEGATIVE

## 2012-12-21 LAB — OB RESULTS CONSOLE ANTIBODY SCREEN: Antibody Screen: NEGATIVE

## 2012-12-21 LAB — OB RESULTS CONSOLE HGB/HCT, BLOOD: Hemoglobin: 13.9 g/dL

## 2012-12-21 LAB — OB RESULTS CONSOLE RPR: RPR: NONREACTIVE

## 2012-12-29 ENCOUNTER — Encounter: Payer: Self-pay | Admitting: *Deleted

## 2012-12-29 DIAGNOSIS — O24919 Unspecified diabetes mellitus in pregnancy, unspecified trimester: Secondary | ICD-10-CM

## 2013-01-01 ENCOUNTER — Other Ambulatory Visit: Payer: Medicaid Other

## 2013-01-01 ENCOUNTER — Encounter: Payer: Medicaid Other | Attending: Obstetrics & Gynecology | Admitting: *Deleted

## 2013-01-01 ENCOUNTER — Encounter: Payer: Self-pay | Admitting: *Deleted

## 2013-01-01 DIAGNOSIS — Z713 Dietary counseling and surveillance: Secondary | ICD-10-CM | POA: Insufficient documentation

## 2013-01-01 DIAGNOSIS — E119 Type 2 diabetes mellitus without complications: Secondary | ICD-10-CM | POA: Insufficient documentation

## 2013-01-01 DIAGNOSIS — O24919 Unspecified diabetes mellitus in pregnancy, unspecified trimester: Secondary | ICD-10-CM | POA: Insufficient documentation

## 2013-01-01 NOTE — Progress Notes (Unsigned)
Patient with history of type I DM comes for insulin adjustment. Presently on Lantus and Novolog. Discontinued Lantus, provided new regimen of NPH and Novolog. New Regimen: 7uNPH in morning and HS. Insulin:carb ration 1:11, sensitivity factor 30. Correction initiated at  90-120 -1u, correction to be given fasting AM and 2hpp breakfast, lunch, dinner. Patient stated that she was knowledgable about carbohydrate counting and insulin action. However she is accustommed to eating at will with correction. She is hesitant to modify her eating behavior.

## 2013-01-02 ENCOUNTER — Encounter: Payer: Self-pay | Admitting: *Deleted

## 2013-01-08 ENCOUNTER — Ambulatory Visit (INDEPENDENT_AMBULATORY_CARE_PROVIDER_SITE_OTHER): Payer: Medicaid Other | Admitting: Obstetrics & Gynecology

## 2013-01-08 ENCOUNTER — Encounter: Payer: Self-pay | Admitting: *Deleted

## 2013-01-08 ENCOUNTER — Encounter: Payer: Medicaid Other | Admitting: Obstetrics & Gynecology

## 2013-01-08 ENCOUNTER — Encounter: Payer: Self-pay | Admitting: Obstetrics & Gynecology

## 2013-01-08 ENCOUNTER — Encounter: Payer: Medicaid Other | Admitting: *Deleted

## 2013-01-08 VITALS — BP 121/76 | Temp 97.2°F | Wt 131.5 lb

## 2013-01-08 DIAGNOSIS — O09891 Supervision of other high risk pregnancies, first trimester: Secondary | ICD-10-CM

## 2013-01-08 DIAGNOSIS — E1042 Type 1 diabetes mellitus with diabetic polyneuropathy: Secondary | ICD-10-CM

## 2013-01-08 DIAGNOSIS — O24919 Unspecified diabetes mellitus in pregnancy, unspecified trimester: Secondary | ICD-10-CM

## 2013-01-08 DIAGNOSIS — E1049 Type 1 diabetes mellitus with other diabetic neurological complication: Secondary | ICD-10-CM

## 2013-01-08 DIAGNOSIS — O09219 Supervision of pregnancy with history of pre-term labor, unspecified trimester: Secondary | ICD-10-CM

## 2013-01-08 DIAGNOSIS — G909 Disorder of the autonomic nervous system, unspecified: Secondary | ICD-10-CM

## 2013-01-08 LAB — POCT URINALYSIS DIP (DEVICE)
Glucose, UA: 100 mg/dL — AB
Hgb urine dipstick: NEGATIVE
Protein, ur: NEGATIVE mg/dL
Specific Gravity, Urine: 1.025 (ref 1.005–1.030)
Urobilinogen, UA: 1 mg/dL (ref 0.0–1.0)
pH: 6.5 (ref 5.0–8.0)

## 2013-01-08 LAB — COMPREHENSIVE METABOLIC PANEL
AST: 13 U/L (ref 0–37)
Alkaline Phosphatase: 54 U/L (ref 39–117)
BUN: 8 mg/dL (ref 6–23)
Creat: 0.6 mg/dL (ref 0.50–1.10)

## 2013-01-08 LAB — HEMOGLOBIN A1C: Hgb A1c MFr Bld: 10 % — ABNORMAL HIGH (ref <5.7)

## 2013-01-08 LAB — HIV ANTIBODY (ROUTINE TESTING W REFLEX): HIV: NONREACTIVE

## 2013-01-08 NOTE — Progress Notes (Signed)
Maternal Fetal Medicine appointment scheduled for 1st screen on 01/25/13 at 1pm.

## 2013-01-08 NOTE — Progress Notes (Signed)
   Subjective:    Ann Robertson is a [id number] [redacted]w[redacted]d being seen today for her first obstetrical visit.  Her obstetrical history is significant for type 1 diabetes, poorly controlled.. Patient does intend to breast feed. Pregnancy history fully reviewed.  Patient reports pain in abdomen and "booty".  Filed Vitals:   01/08/13 0814  BP: 121/76  Temp: 97.2 F (36.2 C)  Weight: 131 lb 8 oz (59.648 kg)    HISTORY: OB History   Grav Para Term Preterm Abortions TAB SAB Ect Mult Living   3 1 0 1 1 0 1 0 0 1      # Outc Date GA Lbr Len/2nd Wgt Sex Del Anes PTL Lv   1 PRE 11/07 [redacted]w[redacted]d 14:00 5lb14oz(2.665kg) M CS Spinal Yes Yes   Comments: failure to progess; type 1 diabetes   2 SAB 2014           3 CUR              Past Medical History  Diagnosis Date  . Diabetes mellitus   . Juvenile diabetes mellitus   . Juvenile diabetes 2000  . Neuropathy   . High cholesterol   . HSV-2 infection    Past Surgical History  Procedure Laterality Date  . Cesarean section     Family History  Problem Relation Age of Onset  . Stroke Mother   . Hypertension Mother   . Heart disease Mother   . Kidney disease Mother   . Hyperlipidemia Father   . Diabetes Father      Exam    Uterus:     Pelvic Exam:    Perineum: No Hemorrhoids   Vulva: normal   Vagina:  normal mucosa, normal discharge   pH: N/a    Cervix: no lesions   Adnexa: normal adnexa   Bony Pelvis: average  System: Breast:  normal appearance, no masses or tenderness   Skin: normal coloration and turgor, no rashes    Neurologic: oriented, normal mood   Extremities: normal strength, tone, and muscle mass   HEENT sclera clear, anicteric and oropharynx clear, no lesions   Mouth/Teeth mucous membranes moist, pharynx normal without lesions   Neck supple and no masses   Cardiovascular: regular rate and rhythm   Respiratory:  appears well, vitals normal, no respiratory distress, acyanotic, normal RR, chest clear, no wheezing,  crepitations, rhonchi, normal symmetric air entry   Abdomen: soft, NT, ND   Urinary: urethral meatus normal      Assessment:    Pregnancy: [id number] Patient Active Problem List   Diagnosis Date Noted  . Type 1 DM currently pregnant, antepartum 12/29/2012        Plan:     Initial labs drawn. Prenatal vitamins. Problem list reviewed and updated. Genetic Screening discussed First Screen: ordered.  Ultrasound discussed; fetal survey: requested.  Follow up in 1 weeks. 24 hour urine, TSH, prenatal labs Diabetic education Pt dropping at night, will chang NPH (Wee Anne's note) Need to get FH by [place] Need ROI for pap smear. History of preterm delivery--17P at 16 weeks.   LEGGETT,KELLY H. 01/08/2013

## 2013-01-08 NOTE — Progress Notes (Signed)
Reviewed cbg schedule and correction to be given for fasting am and 2 hrs post-prandial.  Pt states she did not have her NPH called in to her pharmacy, therefore pt decided not to take it until she could get her regular MD to call it in this past Friday.  Glucose values in the 200's and 300's until that time. (Encouraged pt to call clinic when she does not have what she needs, insulin, test strips, etc)  Husband recorded instructions regarding NPH and correction and meal coverage. Reviewed pt's cbg's per her meter, but the meter's time was incorrect. Husband able to reset. Pt has very short attention span, but husband seemed very attentive and exhibited understanding. Thank you, Rosita Kea, RN, Diabetes CNS [phone])

## 2013-01-08 NOTE — Progress Notes (Signed)
Pulse- 88 Vaginal discharge- white with odor Pt reports constipation New ob packet given

## 2013-01-09 LAB — OBSTETRIC PANEL
Eosinophils Absolute: 0.1 10*3/uL (ref 0.0–0.7)
Eosinophils Relative: 2 % (ref 0–5)
Lymphs Abs: 1.8 10*3/uL (ref 0.7–4.0)
MCH: 30.5 pg (ref 26.0–34.0)
MCHC: 34.1 g/dL (ref 30.0–36.0)
MCV: 89.6 fL (ref 78.0–100.0)
Platelets: 350 10*3/uL (ref 150–400)
RBC: 4.03 MIL/uL (ref 3.87–5.11)
Rh Type: POSITIVE

## 2013-01-09 LAB — GC/CHLAMYDIA PROBE AMP: GC Probe RNA: NEGATIVE

## 2013-01-10 ENCOUNTER — Encounter: Payer: Medicaid Other | Admitting: Family Medicine

## 2013-01-10 LAB — HEMOGLOBINOPATHY EVALUATION
Hemoglobin Other: 0 %
Hgb A: 97.4 % (ref 96.8–97.8)

## 2013-01-11 ENCOUNTER — Encounter: Payer: Self-pay | Admitting: Obstetrics & Gynecology

## 2013-01-11 ENCOUNTER — Telehealth: Payer: Self-pay | Admitting: Obstetrics and Gynecology

## 2013-01-11 DIAGNOSIS — A749 Chlamydial infection, unspecified: Secondary | ICD-10-CM

## 2013-01-11 DIAGNOSIS — O98811 Other maternal infectious and parasitic diseases complicating pregnancy, first trimester: Secondary | ICD-10-CM | POA: Insufficient documentation

## 2013-01-11 LAB — CULTURE, OB URINE: Colony Count: 40000

## 2013-01-11 MED ORDER — AZITHROMYCIN 500 MG PO TABS: 1000.0000 mg | ORAL_TABLET | Freq: Every day | ORAL | Status: DC

## 2013-01-11 NOTE — Telephone Encounter (Signed)
Pt returned call and I informed her that she is + for chlamydia and that a Rx has been sent to her [facility] [address].  Her and her partner(s) she get treated at the [facility].  Pt stated understanding and did not have any further questions.

## 2013-01-11 NOTE — Telephone Encounter (Signed)
Called patient to give result of positive chlamydia (from note sent by Dr. Gala Romney) and that Rx sent to pharmacy. NO ANSWER. Left message to call us back for important message. Please advice to refer partner to health department.

## 2013-01-14 ENCOUNTER — Other Ambulatory Visit: Payer: Self-pay | Admitting: Obstetrics & Gynecology

## 2013-01-14 DIAGNOSIS — O2341 Unspecified infection of urinary tract in pregnancy, first trimester: Secondary | ICD-10-CM | POA: Insufficient documentation

## 2013-01-14 MED ORDER — CEPHALEXIN 500 MG PO CAPS: 500.0000 mg | ORAL_CAPSULE | Freq: Four times a day (QID) | ORAL | Status: DC

## 2013-01-14 NOTE — Progress Notes (Signed)
Pt has klebsiella and e coli uti both sensitive to keflex.  Rx e prescribed and RN to call pt to inform her of infection and to pick up Rx.

## 2013-01-15 ENCOUNTER — Ambulatory Visit (INDEPENDENT_AMBULATORY_CARE_PROVIDER_SITE_OTHER): Payer: Medicaid Other | Admitting: *Deleted

## 2013-01-15 ENCOUNTER — Encounter: Payer: Medicaid Other | Admitting: Family Medicine

## 2013-01-15 DIAGNOSIS — O24919 Unspecified diabetes mellitus in pregnancy, unspecified trimester: Secondary | ICD-10-CM

## 2013-01-15 NOTE — Progress Notes (Signed)
Pt here today with log book revealing glucose in mid-upper 100's fasting and in 200's-400's after meals. Pt states she does not want her glucose to drop down to goal levels of 60-120 mg/dL as she states she feels terrible at these levels. If pt is less than 150 mg/dL, she feels she is continuing to drop when she checks and then will treat until she feels better, which by that time, she is back into the 300's to 400's. I advised this patient that she will eventually get used to normal glucose values and that the potential for fetal complications is higher with glucose out of control. However, the patient at first refused any change in her insulin doses, but then agreed to increase a little because the amount of correction needed seemed so high to her. I am at a complete loss as to how to make this pt understand the need for glucose control.  She stated her last HgbA1C was at 11.9% and then dropped to 11.7% which she felt was  Very good and states her private MD stated she was doing so much better. I reviewed her regimen with her as well as her meal plan, and she voiced understanding. This patient does not give me her full attention when we are talking/instructing.  She questions, then states she is agreeable, then again questions as to why, how etc. Pt is totally in denial of her glucose levels being out of control.  Asked her to try and follow the regimen and to return for evaluation. Thank you, Rosita Kea, RN, Diabetes CNS [phone])

## 2013-01-17 ENCOUNTER — Telehealth: Payer: Self-pay | Admitting: *Deleted

## 2013-01-17 ENCOUNTER — Emergency Department (HOSPITAL_BASED_OUTPATIENT_CLINIC_OR_DEPARTMENT_OTHER)
Admission: EM | Admit: 2013-01-17 | Discharge: 2013-01-17 | Disposition: A | Discharge status: Home / Self Care | Payer: Medicaid Other | Attending: Emergency Medicine | Admitting: Emergency Medicine

## 2013-01-17 ENCOUNTER — Encounter (HOSPITAL_BASED_OUTPATIENT_CLINIC_OR_DEPARTMENT_OTHER): Payer: Self-pay | Admitting: *Deleted

## 2013-01-17 DIAGNOSIS — Z8619 Personal history of other infectious and parasitic diseases: Secondary | ICD-10-CM | POA: Insufficient documentation

## 2013-01-17 DIAGNOSIS — Z8669 Personal history of other diseases of the nervous system and sense organs: Secondary | ICD-10-CM | POA: Insufficient documentation

## 2013-01-17 DIAGNOSIS — E109 Type 1 diabetes mellitus without complications: Secondary | ICD-10-CM | POA: Insufficient documentation

## 2013-01-17 DIAGNOSIS — A749 Chlamydial infection, unspecified: Secondary | ICD-10-CM | POA: Insufficient documentation

## 2013-01-17 DIAGNOSIS — Z862 Personal history of diseases of the blood and blood-forming organs and certain disorders involving the immune mechanism: Secondary | ICD-10-CM | POA: Insufficient documentation

## 2013-01-17 DIAGNOSIS — Z8639 Personal history of other endocrine, nutritional and metabolic disease: Secondary | ICD-10-CM | POA: Insufficient documentation

## 2013-01-17 DIAGNOSIS — Z794 Long term (current) use of insulin: Secondary | ICD-10-CM | POA: Insufficient documentation

## 2013-01-17 MED ORDER — AZITHROMYCIN 250 MG PO TABS
1000.0000 mg | ORAL_TABLET | Freq: Once | ORAL | Status: AC
  Administered 2013-01-17: 1000 mg via ORAL
  Filled 2013-01-17: qty 2

## 2013-01-17 MED ORDER — ONDANSETRON 4 MG PO TBDP
4.0000 mg | ORAL_TABLET | Freq: Once | ORAL | Status: AC
  Administered 2013-01-17: 4 mg via ORAL
  Filled 2013-01-17: qty 1

## 2013-01-17 NOTE — Progress Notes (Signed)
She said she forgot about it. She will collect it and bring to her appt on Monday if she still comes here. She is trying to transfer her care to her previous obgyn, She stated that she will call and let us know.

## 2013-01-17 NOTE — ED Notes (Signed)
Pt reports she dropped her meds given to here for  DX chlamydia x 1 day ago , pt here for prescriptions of med, pt is 11 weeks preg

## 2013-01-17 NOTE — Telephone Encounter (Signed)
Message copied by Mitchell Heir on Wed Jan 17, 2013 10:15 AM ------      Message from: Guss Bunde      Created: Sun Jan 14, 2013  1:58 PM       See progress note.  Pt needs to pick up antibiotics. ------

## 2013-01-17 NOTE — Telephone Encounter (Signed)
I spoke with patient and informed her that she had abx at pharmacy for her uti. Also asked her about the 24 hr urine. She states that she forgot it and will collect it if she comes back here. She is considering transferring her care and is in the process of finding out if her previous obgyn will take her on. She will call to let us know.

## 2013-01-17 NOTE — Telephone Encounter (Signed)
Message copied by Mitchell Heir on Wed Jan 17, 2013 10:16 AM ------      Message from: Guss Bunde      Created: Mon Jan 15, 2013  8:44 AM       Can you call pt and see what is going on with her 24 hour urine.                  ----- Message -----         From: SYSTEM         Sent: 01/13/2013  12:07 AM           To: Guss Bunde, MD                   ------

## 2013-01-17 NOTE — ED Provider Notes (Signed)
CSN: [id number]     Arrival date & time 01/17/13  1833 History     First MD Initiated Contact with Patient 01/17/13 1912     Chief Complaint  Patient presents with  . Exposure to STD   (Consider location/radiation/quality/duration/timing/severity/associated sxs/prior Treatment) Patient is a 22 y.o. female presenting with STD exposure. The history is provided by the patient. No language interpreter was used.  Exposure to STD This is a new problem. The current episode started 1 to 4 weeks ago. The problem occurs constantly. The problem has been unchanged. Pertinent negatives include no abdominal pain or fever. Nothing aggravates the symptoms. She has tried nothing for the symptoms. The treatment provided no relief.  Pt requesting zithromax for treatment of chlamydia.  Pt was given RX for but she dropped the medication.  Pt denies any other complaints.  Pt reports [facility] but they could not get her doctor  Past Medical History  Diagnosis Date  . Diabetes mellitus   . Juvenile diabetes mellitus   . Juvenile diabetes 2000  . Neuropathy   . High cholesterol   . HSV-2 infection    Past Surgical History  Procedure Laterality Date  . Cesarean section     Family History  Problem Relation Age of Onset  . Stroke Mother   . Hypertension Mother   . Heart disease Mother   . Kidney disease Mother   . Hyperlipidemia Father   . Diabetes Father    History  Substance Use Topics  . Smoking status: Never Smoker   . Smokeless tobacco: Not on file  . Alcohol Use: No   OB History   Grav Para Term Preterm Abortions TAB SAB Ect Mult Living   3 1 0 1 1 0 1 0 0 1      Review of Systems  Constitutional: Negative for fever.  Gastrointestinal: Negative for abdominal pain.  All other systems reviewed and are negative.    Allergies  Hydromorphone hcl  Home Medications   Current Outpatient Rx  Name  Route  Sig  Dispense  Refill  . azithromycin (ZITHROMAX) 500 MG tablet   Oral    Take 2 tablets (1,000 mg total) by mouth daily.   1 tablet   0     Dispense to equal 1Gram PO 1 dose to treat chlamyd ...   . cephALEXin (KEFLEX) 500 MG capsule   Oral   Take 1 capsule (500 mg total) by mouth 4 (four) times daily.   28 capsule   0   . insulin aspart (NOVOLOG) 100 UNIT/ML injection   Subcutaneous   Inject 1-10 Units into the skin 3 (three) times daily with meals. 1 unit per 10 grams carbohydrate  Breakfast- (15-30 grams)--1-3 units  Lunch and supper (45-60 grams)--4-6 units         . insulin lispro (HUMALOG) 100 UNIT/ML injection   Subcutaneous   Inject 2-14 Units into the skin 4 (four) times daily. Correction scale: fasting and 2 hrs after first bite of each meal: Fasting @ 8am and 2 hrs after the start of each meal 121-160--2 units 161-200--4 units 201-240--6 units 241-280--8 units 281-320--10 units 321-360--12 units 361-420--14 units (call MD if symptomatic-headache, frequent urination, excessive thirst         . insulin lispro (HUMALOG) 100 UNIT/ML injection   Subcutaneous   Inject into the skin 3 (three) times daily before meals.         . insulin NPH (HUMULIN N,NOVOLIN N) 100 UNIT/ML injection  Subcutaneous   Inject 8 Units into the skin 2 (two) times daily at 8 am and 10 pm.          . Prenatal Vit-Fe Fumarate-FA (PRENATAL COMPLETE) 14-0.4 MG TABS   Oral   Take 1 tablet by mouth daily.   60 each   2    BP 131/70  Pulse 100  Temp(Src) 98.2 F (36.8 C) (Oral)  Resp 16  Ht 5\' 4"  (1.626 m)  Wt 134 lb (60.782 kg)  BMI 22.99 kg/m2  SpO2 100%  LMP 10/28/2012 Physical Exam  Nursing note and vitals reviewed. Constitutional: She is oriented to person, place, and time. She appears well-developed and well-nourished.  HENT:  Head: Normocephalic.  Eyes: Pupils are equal, round, and reactive to light.  Cardiovascular: Normal rate.   Pulmonary/Chest: Effort normal.  Abdominal: Soft.  Musculoskeletal: Normal range of motion.   Neurological: She is alert and oriented to person, place, and time. She has normal reflexes.  Skin: Skin is warm.  Psychiatric: She has a normal mood and affect.    ED Course   Procedures (including critical care time)  Labs Reviewed - No data to display No results found. 1. Chlamydia     MDM  fht's 140.   Pt given zofran and zithromax  Fransico Meadow, PA-C 01/17/13 [place], [place] 01/17/13 1956

## 2013-01-18 ENCOUNTER — Encounter (HOSPITAL_COMMUNITY): Payer: Self-pay | Admitting: Obstetrics & Gynecology

## 2013-01-18 NOTE — ED Provider Notes (Signed)
History/physical exam/procedure(s) were performed by non-physician practitioner and as supervising physician I was immediately available for consultation/collaboration. I have reviewed all notes and am in agreement with care and plan.   Shaune Pollack, MD 01/18/13 [phone]

## 2013-01-22 ENCOUNTER — Encounter: Payer: Medicaid Other | Admitting: Family Medicine

## 2013-01-22 ENCOUNTER — Ambulatory Visit (INDEPENDENT_AMBULATORY_CARE_PROVIDER_SITE_OTHER): Payer: Medicaid Other | Admitting: Obstetrics & Gynecology

## 2013-01-22 VITALS — BP 108/76 | Temp 97.7°F | Wt 134.8 lb

## 2013-01-22 DIAGNOSIS — O24919 Unspecified diabetes mellitus in pregnancy, unspecified trimester: Secondary | ICD-10-CM

## 2013-01-22 DIAGNOSIS — O98319 Other infections with a predominantly sexual mode of transmission complicating pregnancy, unspecified trimester: Secondary | ICD-10-CM

## 2013-01-22 DIAGNOSIS — A749 Chlamydial infection, unspecified: Secondary | ICD-10-CM

## 2013-01-22 DIAGNOSIS — O24911 Unspecified diabetes mellitus in pregnancy, first trimester: Secondary | ICD-10-CM

## 2013-01-22 MED ORDER — AZITHROMYCIN 500 MG PO TABS: 1000.0000 mg | ORAL_TABLET | Freq: Every day | ORAL | Status: DC

## 2013-01-22 NOTE — Progress Notes (Signed)
Pulse- 102 Patient still has not received treatment for chlamydia, needs treatment today

## 2013-01-22 NOTE — Progress Notes (Signed)
Will not transfer care. Bring her [facility] records next. Appt [facility]  In 2 days

## 2013-01-22 NOTE — Patient Instructions (Addendum)
Vaginal Birth After Cesarean Delivery Vaginal birth after Cesarean delivery (VBAC) is giving birth vaginally after previously delivering a baby by a cesarean. In the past, if a woman had a Cesarean delivery, all births afterwards would be done by Cesarean delivery. This is no longer true. It can be safe for the mother to try a vaginal delivery after having a Cesarean. The final decision to have a VBAC or repeat Cesarean delivery should be between the patient and her caregiver. The risks and benefits can be discussed relative to the reason for, and the type of the previous Cesarean delivery. WOMEN WHO PLAN TO HAVE A VBAC SHOULD CHECK WITH THEIR DOCTOR TO BE SURE THAT:  The previous Cesarean was done with a low transverse uterine incision (not a vertical classical incision).  The birth canal is big enough for the baby.  There were no other operations on the uterus.  They will have an electronic fetal monitor (EFM) on at all times during labor.  An operating room would be available and ready in case an emergency Cesarean is needed.  A doctor and surgical nursing staff would be available at all times during labor to be ready to do an emergency Cesarean if necessary.  An anesthesiologist would be present in case an emergency Cesarean is needed.  The nursery is prepared and has adequate personnel and necessary equipment available to care for the baby in case of an emergency Cesarean. BENEFITS OF VBAC:  Shorter stay in the hospital.  Lower delivery, nursery and hospital costs.  Less blood loss and need for blood transfusions.  Less fever and discomfort from major surgery.  Lower risk of blood clots.  Lower risk of infection.  Shorter recovery after going home.  Lower risk of other surgical complications, such as opening of the incision or hernia in the incision.  Decreased risk of injury to other organs.  Decreased risk for having to remove the uterus (hysterectomy).  Decreased risk  for the placenta to completely or partially cover the opening of the uterus (placenta previa) with a future pregnancy.  Ability to have a larger family if desired. RISKS OF A VBAC:  Rupture of the uterus.  Having to remove the uterus (hysterectomy) if it ruptures.  All the complications of major surgery and/or injury to other organs.  Excessive bleeding, blood clots and infection.  Lower Apgar scores (method to evaluate the newborn based on appearance, pulse, grimace, activity, and respiration) and more risks to the baby.  There is a higher risk of uterine rupture if you induce or augment labor.  There is a higher risk of uterine rupture if you use medications to ripen the cervix. VBAC SHOULD NOT BE DONE IF:  The previous Cesarean was done with a vertical (classical) or T-shaped incision, or you do not know what kind of an incision was made.  You had a ruptured uterus.  You had surgery on your uterus.  You have medical or obstetrical problems.  There are problems with the baby.  There were two previous Cesarean deliveries and no vaginal deliveries. OTHER FACTS TO KNOW ABOUT VBAC:  It is safe to have an epidural anesthetic with VBAC.  It is safe to turn the baby from a breech position (attempt an external cephalic version).  It is safe to try a VBAC with twins.  Pregnancies later than 40 weeks have not been successful with VBAC.  There is an increased failure rate of a VBAC in obese pregnant women.  There is  an increased failure rate with VABC if the baby weighs 8.8 pounds (4000 grams) or more.  There is an increased failure rate if the time between the Cesarean and VBAC is less than 19 months.  There is an increased failure rate if pre-eclampsia is present (high blood pressure, protein in the urine and swelling of face and extremities).  VBAC is very successful if there was a previous vaginal birth.  VBAC is very successful when the labor starts spontaneously before  the due date.  Delivery of VBAC is similar to having a normal spontaneous vaginal delivery. It is important to discuss VBAC with your caregiver early in the pregnancy so you can understand the risks, benefits and options. It will give you time to decide what is best in your particular case relevant to the reason for your previous Cesarean delivery. It should be understood that medical changes in the mother or pregnancy may occur during the pregnancy, which make it necessary to change you or your caregiver's initial decision. The counseling, concerns and decisions should be documented in the medical record and signed by all parties. Document Released: 11/07/2006 Document Revised: 08/09/2011 Document Reviewed: 06/28/2008 [facility] Patient Information 2014 [place], [place]. Type 1 or Type 2 Diabetes Mellitus During Pregnancy Diabetes mellitus, often simply referred to as diabetes, is a long-term (chronic) disease. Type 1 diabetes occurs when the islet cells in the pancreas that make insulin (a hormone) are destroyed and can no longer make insulin. Type 2 diabetes occurs when the pancreas does not make enough insulin, the cells are less responsive to the insulin that is made (insulin resistance), or both. Insulin is needed to move sugars from food into the tissue cells. The tissue cells use the sugars for energy. The lack of insulin or the lack of normal response to insulin causes excess sugars to build up in the blood instead of going into the tissue cells. As a result, high blood sugar (hyperglycemia) develops.  If blood glucose levels are kept in the normal range both before and during pregnancy, women can have a healthy pregnancy. If your blood glucose levels are not well controlled, there may be risks to you, your unborn baby (fetus), your labor and delivery, or your newborn baby.  RISK FACTORS  You are predisposed to developing type 1 diabetes if someone in your family has diabetes and you are exposed  to certain environmental triggers.  You have an increased chance of developing type 2 diabetes if you have a family history of diabetes and also have one or more of the following risk factors:  Being overweight.  Having an inactive lifestyle.  Having a history of consistently eating high-calorie foods. SYMPTOMS The symptoms of diabetes include:  Increased thirst (polydipsia).  Increased urination (polyuria).  Increased urination during the night (nocturia).  Weight loss. This weight loss may be rapid.  Frequent, recurring infections.  Tiredness (fatigue).  Weakness.  Vision changes, such as blurred vision.  Fruity smell to your breath.  Abdominal pain.  Nausea or vomiting. DIAGNOSIS  Diabetes is diagnosed when blood glucose levels are increased. Your blood glucose level may be checked by one or more of the following blood tests:  A fasting blood glucose test. You will not be allowed to eat for at least 8 hours before a blood sample is taken.  A random blood glucose test. Your blood glucose is checked at any time of the day regardless of when you ate.  A hemoglobin A1c blood glucose test. A hemoglobin  A1c test provides information about blood glucose control over the previous 3 months.  An oral glucose tolerance test (OGTT). Your blood glucose is measured after you have not eaten (fasted) for 1 3 hours and then after you drink a glucose-containing beverage. An OGTT is usually performed during weeks 24 28 of your pregnancy. TREATMENT   You will need to take diabetes medicine or insulin daily to keep blood glucose levels in the desired range.  You will need to match insulin dosing with exercise and healthy food choices. The treatment goal is to maintain the before-meal (preprandial), bedtime, and overnight blood glucose level at 60 99 mg/dL during pregnancy. The treatment goal is to further maintain the peak after-meal blood sugar (postprandial glucose) level at 100  140 mg/dL.  HOME CARE INSTRUCTIONS   Have your hemoglobin A1c level checked twice a year.  Perform daily blood glucose monitoring as directed by your caregiver. It is common to perform frequent blood glucose monitoring.  Monitor urine ketones when you are ill and as directed by your caregiver.  Take your diabetes medicine and insulin as directed by your caregiver to maintain your blood glucose level in the desired range.  Never run out of diabetes medicine or insulin. It is needed every day.  Adjust insulin based on your intake of carbohydrates. Carbohydrates can raise blood glucose levels but need to be included in your diet. Carbohydrates provide vitamins, minerals, and fiber, which are an essential part of a healthy diet. Carbohydrates are found in fruits, vegetables, whole grains, dairy products, legumes, and foods containing added sugars.  Eat healthy foods. Alternate 3 meals with 3 snacks.  Maintain a healthy weight gain. The usual total expected weight gain varies according to your prepregnancy body mass index (BMI).  Carry a medical alert card or wear medical alert jewelry.  Carry a 15 gram carbohydrate snack with you at all times to treat low blood sugar (hypoglycemia). Some examples of 15 gram carbohydrate snacks include:  Glucose tablets, 3 or 4.  Glucose gel, 15 gram tube.  Raisins, 2 tablespoons (24 grams).  Jelly beans, 6.  Animal crackers, 8.  Fruit juice, regular soda, or low fat milk, 4 ounces (120 mL).  Gummy treats, 9.  Recognize hypoglycemia. Hypoglycemia during pregnancy occurs with blood glucose levels of 60 mg/dL and below. The risk for hypoglycemia increases when fasting or skipping meals, during or after intense exercise, and during sleep. Hypoglycemia symptoms can include:  Tremors or shakes.  Decreased ability to concentrate.  Sweating.  Increased heart rate.  Headache.  Dry mouth.  Hunger.  Irritability.  Anxiety.  Restless  sleep.  Altered speech or coordination.  Confusion.  Treat hypoglycemia promptly. If you are alert and able to safely swallow, follow the 15:15 rule:  Take 15 20 grams of rapid-acting glucose or carbohydrate. Rapid-acting options include glucose gel, glucose tablets, or 4 ounces (120 mL) of fruit juice, regular soda, or low-fat milk.  Check your blood glucose level 15 minutes after taking the glucose.  Take 15 20 grams more of glucose if the repeat blood glucose level is still 70 mg/dL or below.  Eat a meal or snack within 1 hour once blood glucose levels return to normal.  Engage in at least 30 minutes of physical activity a day or as directed by your caregiver. Ten minutes of physical activity timed 30 minutes after each meal is encouraged to control postprandial blood glucose levels.   Be alert to polyuria and polydipsia, which are early  signs of hyperglycemia. An early awareness of hyperglycemia allows for prompt treatment. Treat hyperglycemia as directed by your caregiver.  Adjust your insulin dosing and food intake as needed if you start a new exercise or sport.  Follow your sick day plan at any time you are unable to eat or drink as usual.  Avoid tobacco and alcohol use.  Follow up with your caregiver regularly.  Follow the advice of your caregiver regarding your prenatal and post-delivery (postpartum) appointments, meal planning, exercise, medicines, vitamins, blood tests, other medical tests, and physical activities.  Continue daily skin and foot care. Examine your skin and feet daily for cuts, bruises, redness, nail problems, bleeding, blisters, or sores. A foot exam by a caregiver should be done annually.  Brush your teeth and gums at least twice a day and floss at least once a day. Follow up with your dentist regularly.  Schedule an eye exam during the first trimester of your pregnancy or as directed by your caregiver.  Share your diabetes management plan with your  workplace or school.  Stay up-to-date with immunizations.  Learn to manage stress.  Obtain ongoing diabetes education and support as needed. SEEK MEDICAL CARE IF:   You are unable to eat food or drink fluids for more than 6 hours.  You have nausea and vomiting for more than 6 hours.  You have a blood glucose level of 200 mg/dL and you have ketones in your urine.  There is a change in mental status.  You develop vision problems.  You have a persistent headache.  You have upper abdominal pain or discomfort.  You develop an additional serious illness.  You have diarrhea for more than 6 hours.  You have been sick or have had a fever for a couple of days and are not getting better. SEEK IMMEDIATE MEDICAL CARE IF:  You have difficulty breathing.  You no longer feel the baby moving.  You are bleeding or have discharge from your vagina.  You start having premature contractions or labor. MAKE SURE YOU:  Understand these instructions.  Will watch your condition.  Will get help right away if you are not doing well or get worse. Document Released: 02/09/2012 Document Revised: 05/03/2012 Document Reviewed: 02/09/2012 [facility] Patient Information 2014 [place].

## 2013-01-23 ENCOUNTER — Other Ambulatory Visit: Payer: Self-pay | Admitting: Family Medicine

## 2013-01-23 DIAGNOSIS — Z3682 Encounter for antenatal screening for nuchal translucency: Secondary | ICD-10-CM

## 2013-01-25 ENCOUNTER — Other Ambulatory Visit: Payer: Self-pay

## 2013-01-25 ENCOUNTER — Encounter (HOSPITAL_COMMUNITY): Payer: Self-pay

## 2013-01-25 ENCOUNTER — Ambulatory Visit (HOSPITAL_COMMUNITY)
Admission: RE | Admit: 2013-01-25 | Discharge: 2013-01-25 | Disposition: A | Discharge status: Home / Self Care | Payer: Medicaid Other | Source: Ambulatory Visit | Attending: Obstetrics & Gynecology | Admitting: Obstetrics & Gynecology

## 2013-01-25 ENCOUNTER — Ambulatory Visit (HOSPITAL_COMMUNITY)
Admission: RE | Admit: 2013-01-25 | Discharge: 2013-01-25 | Disposition: A | Discharge status: Home / Self Care | Payer: Medicaid Other | Source: Ambulatory Visit | Attending: Family Medicine | Admitting: Family Medicine

## 2013-01-25 VITALS — BP 120/73 | HR 92 | Wt 135.5 lb

## 2013-01-25 DIAGNOSIS — O351XX0 Maternal care for (suspected) chromosomal abnormality in fetus, not applicable or unspecified: Secondary | ICD-10-CM | POA: Insufficient documentation

## 2013-01-25 DIAGNOSIS — O34219 Maternal care for unspecified type scar from previous cesarean delivery: Secondary | ICD-10-CM | POA: Insufficient documentation

## 2013-01-25 DIAGNOSIS — O24919 Unspecified diabetes mellitus in pregnancy, unspecified trimester: Secondary | ICD-10-CM | POA: Insufficient documentation

## 2013-01-25 DIAGNOSIS — O24912 Unspecified diabetes mellitus in pregnancy, second trimester: Secondary | ICD-10-CM

## 2013-01-25 DIAGNOSIS — Z3682 Encounter for antenatal screening for nuchal translucency: Secondary | ICD-10-CM

## 2013-01-25 DIAGNOSIS — Z3689 Encounter for other specified antenatal screening: Secondary | ICD-10-CM | POA: Insufficient documentation

## 2013-01-25 DIAGNOSIS — O3510X0 Maternal care for (suspected) chromosomal abnormality in fetus, unspecified, not applicable or unspecified: Secondary | ICD-10-CM | POA: Insufficient documentation

## 2013-01-25 NOTE — Progress Notes (Signed)
Ann Robertson  was seen today for an ultrasound appointment.  See full report in [facility].  Impression: Single IUP at 12 4/7 weeks Class C diabetes Normal NT (1.1 mm).  Nasal bone visualized. First trimester aneuploidy screen performed as noted above.   Recommendations: Please do not draw triple/quad screen, though patient should be offered MSAFP for neural tube defect screening.  Recommend follow up for detailed ultrasound at 18 weeks, fetal Echo at 20-22 weeks due to pregestational diabetes  Benjaman Lobe, MD

## 2013-01-31 ENCOUNTER — Encounter: Payer: Self-pay | Admitting: *Deleted

## 2013-01-31 ENCOUNTER — Telehealth (HOSPITAL_COMMUNITY): Payer: Self-pay

## 2013-01-31 DIAGNOSIS — O24919 Unspecified diabetes mellitus in pregnancy, unspecified trimester: Secondary | ICD-10-CM

## 2013-01-31 NOTE — Telephone Encounter (Signed)
Called Ms. Posthumus to review the results of her first trimester screen.  Patient identified by name and DOB.  Discussed that the results showed an increased risk for Down syndrome, 1 in 91 (1.1%).  Reviewed that her age related risk for Down syndrome is 1 in 42.  Discussed the option of genetic counseling to review the results and other available testing options in detail.  Patient agreed and requested appt for tomorrow.  She is scheduled for genetic counseling at 9:30 am.  All questions answered.  Sharyne Richters, MS Certified Genetic Counselor

## 2013-02-01 ENCOUNTER — Ambulatory Visit (HOSPITAL_COMMUNITY)
Admission: RE | Admit: 2013-02-01 | Discharge: 2013-02-01 | Disposition: A | Discharge status: Home / Self Care | Payer: Medicaid Other | Source: Ambulatory Visit | Attending: Obstetrics & Gynecology | Admitting: Obstetrics & Gynecology

## 2013-02-01 ENCOUNTER — Other Ambulatory Visit: Payer: Self-pay

## 2013-02-01 DIAGNOSIS — O3510X0 Maternal care for (suspected) chromosomal abnormality in fetus, unspecified, not applicable or unspecified: Secondary | ICD-10-CM | POA: Insufficient documentation

## 2013-02-01 DIAGNOSIS — O351XX0 Maternal care for (suspected) chromosomal abnormality in fetus, not applicable or unspecified: Secondary | ICD-10-CM | POA: Insufficient documentation

## 2013-02-01 DIAGNOSIS — IMO0002 Reserved for concepts with insufficient information to code with codable children: Secondary | ICD-10-CM | POA: Insufficient documentation

## 2013-02-01 NOTE — Progress Notes (Signed)
Genetic Counseling  High-Risk Gestation Note  Appointment Date:  02/01/2013 Referred By: Guss Bunde, MD Date of Birth:  02-20-1991 Partner:  Mason Jim     Pregnancy HistoryFR:[id number] Estimated Date of Delivery: 08/05/13 Estimated Gestational Age: [redacted]w[redacted]d Attending: Benjaman Lobe, MD   [name] and her partner, Mr. Mason Jim, were seen for genetic counseling because of an increased risk for fetal Down syndrome based on first trimester screening performed through [facility].  They were counseled regarding the First trimester screen result and the associated 1 in 91 risk for fetal Down syndrome.  We reviewed chromosomes, nondisjunction, and the common features and variable prognosis of Down syndrome.  In addition, we reviewed the screen adjusted reduction in risks for trisomy 18/13 (1 in 1,849 to < 1 in 10,000).  We also discussed other explanations for a screen positive result including: differences in maternal metabolism, and normal variation. They understand that this screening is not diagnostic for Down syndrome but provides a risk assessment. We specifically discussed that the level of one of the proteins analyzed on the screen, PAPP-A, was very low (0.23 MoM, which is the 1%tile).  This has been associated with an increased risk for growth restriction or poor pregnancy outcome later in pregnancy; therefore, we would recommend offering a follow up ultrasound for fetal growth in the third trimester.  We reviewed available screening options including noninvasive prenatal screening (NIPS)/cell free fetal DNA (cffDNA) testing and detailed ultrasound.  They were counseled that screening tests are used to modify a patient's a priori risk for aneuploidy, typically based on age. This estimate provides a pregnancy specific risk assessment. We reviewed the benefits and limitations of each option. Specifically, we discussed the conditions for which each test screens, the  detection rates, and false positive rates of each. They were also counseled regarding diagnostic testing via amniocentesis. We reviewed the approximate 1 in [id number] risk for complications for amniocentesis, including spontaneous pregnancy loss.   After consideration of all the options, she elected to proceed with NIPS ([facility]) at the time of today's visit.  The patient declined diagnostic testing at this time. Detailed ultrasound is scheduled for 03/08/13. They understand that screening tests cannot rule out all birth defects or genetic syndromes. The patient was advised of this limitation and states she still does not want additional testing at this time.   Ms. Trinnie Caruso was provided with written information regarding sickle cell anemia (SCA) including the carrier frequency and incidence in the African-American population, the availability of carrier testing and prenatal diagnosis if indicated.  In addition, we discussed that hemoglobinopathies are routinely screened for as part of the [facility] newborn screening panel.  Hemoglobin electrophoresis was previously performed through her OB provider, which revealed the presence of normal adult hemoglobin. Thus, the patient does not appear to have sickle cell trait or other hemoglobin variants.    Both family histories were reviewed and found to be noncontributory for birth defects, intellectual disability, recurrent pregnancy loss, or known genetic conditions. Without further information regarding the provided family history, an accurate genetic risk cannot be calculated. Further genetic counseling is warranted if more information is obtained.  Ms. Nyashia Hirschy denied exposure to environmental toxins or chemical agents. She denied the use of alcohol, tobacco or street drugs. She denied significant viral illnesses during the course of her pregnancy. Her medical and surgical histories were contributory for diabetes, for which she reported she takes insulin.  The patient reported that she is seen  regularly by an endocrinologist and has met with diabetes educator during the pregnancy. We reviewed the importance of monitoring and controlling blood glucose levels during a pregnancy complicated with maternal diabetes, as this condition has been associated with an increased risk for both fetal and maternal complications.  There appears to be an increased risk of congenital birth defects that is 2-5 times higher than the risk of the general population, including congenital heart defects and neural tube defects such as spina bifida.  However, the risk to the baby greatly depends on the diabetic control of the mother. We reviewed that given this history, detailed ultrasound and fetal echocardiogram will be available in the current pregnancy.   I counseled this couple for approximately 45 minutes regarding the above risks and available options.   Chipper Oman, MS,  Certified Genetic Counselor 02/01/2013

## 2013-02-05 ENCOUNTER — Ambulatory Visit (INDEPENDENT_AMBULATORY_CARE_PROVIDER_SITE_OTHER): Payer: Medicaid Other | Admitting: Family Medicine

## 2013-02-05 VITALS — BP 113/69 | Temp 97.2°F | Wt 138.0 lb

## 2013-02-05 DIAGNOSIS — A749 Chlamydial infection, unspecified: Secondary | ICD-10-CM

## 2013-02-05 DIAGNOSIS — O24919 Unspecified diabetes mellitus in pregnancy, unspecified trimester: Secondary | ICD-10-CM

## 2013-02-05 DIAGNOSIS — O98319 Other infections with a predominantly sexual mode of transmission complicating pregnancy, unspecified trimester: Secondary | ICD-10-CM

## 2013-02-05 DIAGNOSIS — A568 Sexually transmitted chlamydial infection of other sites: Secondary | ICD-10-CM

## 2013-02-05 DIAGNOSIS — O24912 Unspecified diabetes mellitus in pregnancy, second trimester: Secondary | ICD-10-CM

## 2013-02-05 DIAGNOSIS — O34219 Maternal care for unspecified type scar from previous cesarean delivery: Secondary | ICD-10-CM

## 2013-02-05 LAB — POCT URINALYSIS DIP (DEVICE)
Leukocytes, UA: NEGATIVE
Protein, ur: NEGATIVE mg/dL
Specific Gravity, Urine: 1.025 (ref 1.005–1.030)
Urobilinogen, UA: 0.2 mg/dL (ref 0.0–1.0)

## 2013-02-05 NOTE — Progress Notes (Signed)
Pulse 98 C/o of vaginal odor.  C/o intermittent feeling of deep pain in left side of chest.

## 2013-02-05 NOTE — Progress Notes (Signed)
BS are from 54-446.  Usually extreme highs or lows.-has endocrinology--consider pump Has vaginal discharge with odor. Check wet prep and GC/Chlam C/o chest pain left sided deep, no radiation, no SOB, no diaphoresis.-ask Endocrine about. Needs quad next visit

## 2013-02-05 NOTE — Patient Instructions (Signed)
Pregnancy - Second Trimester The second trimester of pregnancy (3 to 6 months) is a period of rapid growth for you and your baby. At the end of the sixth month, your baby is about 9 inches long and weighs 1 1/2 pounds. You will begin to feel the baby move between 18 and 20 weeks of the pregnancy. This is called quickening. Weight gain is faster. A clear fluid (colostrum) may leak out of your breasts. You may feel small contractions of the womb (uterus). This is known as false labor or Braxton-Hicks contractions. This is like a practice for labor when the baby is ready to be born. Usually, the problems with morning sickness have usually passed by the end of your first trimester. Some women develop small dark blotches (called cholasma, mask of pregnancy) on their face that usually goes away after the baby is born. Exposure to the sun makes the blotches worse. Acne may also develop in some pregnant women and pregnant women who have acne, may find that it goes away. PRENATAL EXAMS  Blood work may continue to be done during prenatal exams. These tests are done to check on your health and the probable health of your baby. Blood work is used to follow your blood levels (hemoglobin). Anemia (low hemoglobin) is common during pregnancy. Iron and vitamins are given to help prevent this. You will also be checked for diabetes between 24 and 28 weeks of the pregnancy. Some of the previous blood tests may be repeated.  The size of the uterus is measured during each visit. This is to make sure that the baby is continuing to grow properly according to the dates of the pregnancy.  Your blood pressure is checked every prenatal visit. This is to make sure you are not getting toxemia.  Your urine is checked to make sure you do not have an infection, diabetes or protein in the urine.  Your weight is checked often to make sure gains are happening at the suggested rate. This is to ensure that both you and your baby are  growing normally.  Sometimes, an ultrasound is performed to confirm the proper growth and development of the baby. This is a test which bounces harmless sound waves off the baby so your caregiver can more accurately determine due dates. Sometimes, a test is done on the amniotic fluid surrounding the baby. This test is called an amniocentesis. The amniotic fluid is obtained by sticking a needle into the belly (abdomen). This is done to check the chromosomes in instances where there is a concern about possible genetic problems with the baby. It is also sometimes done near the end of pregnancy if an early delivery is required. In this case, it is done to help make sure the baby's lungs are mature enough for the baby to live outside of the womb. CHANGES OCCURING IN THE SECOND TRIMESTER OF PREGNANCY Your body goes through many changes during pregnancy. They vary from person to person. Talk to your caregiver about changes you notice that you are concerned about.  During the second trimester, you will likely have an increase in your appetite. It is normal to have cravings for certain foods. This varies from person to person and pregnancy to pregnancy.  Your lower abdomen will begin to bulge.  You may have to urinate more often because the uterus and baby are pressing on your bladder. It is also common to get more bladder infections during pregnancy. You can help this by drinking lots of fluids   and emptying your bladder before and after intercourse.  You may begin to get stretch marks on your hips, abdomen, and breasts. These are normal changes in the body during pregnancy. There are no exercises or medicines to take that prevent this change.  You may begin to develop swollen and bulging veins (varicose veins) in your legs. Wearing support hose, elevating your feet for 15 minutes, 3 to 4 times a day and limiting salt in your diet helps lessen the problem.  Heartburn may develop as the uterus grows and  pushes up against the stomach. Antacids recommended by your caregiver helps with this problem. Also, eating smaller meals 4 to 5 times a day helps.  Constipation can be treated with a stool softener or adding bulk to your diet. Drinking lots of fluids, and eating vegetables, fruits, and whole grains are helpful.  Exercising is also helpful. If you have been very active up until your pregnancy, most of these activities can be continued during your pregnancy. If you have been less active, it is helpful to start an exercise program such as walking.  Hemorrhoids may develop at the end of the second trimester. Warm sitz baths and hemorrhoid cream recommended by your caregiver helps hemorrhoid problems.  Backaches may develop during this time of your pregnancy. Avoid heavy lifting, wear low heal shoes, and practice good posture to help with backache problems.  Some pregnant women develop tingling and numbness of their hand and fingers because of swelling and tightening of ligaments in the wrist (carpel tunnel syndrome). This goes away after the baby is born.  As your breasts enlarge, you may have to get a bigger bra. Get a comfortable, cotton, support bra. Do not get a nursing bra until the last month of the pregnancy if you will be nursing the baby.  You may get a dark line from your belly button to the pubic area called the linea nigra.  You may develop rosy cheeks because of increase blood flow to the face.  You may develop spider looking lines of the face, neck, arms, and chest. These go away after the baby is born. HOME CARE INSTRUCTIONS   It is extremely important to avoid all smoking, herbs, alcohol, and unprescribed drugs during your pregnancy. These chemicals affect the formation and growth of the baby. Avoid these chemicals throughout the pregnancy to ensure the delivery of a healthy infant.  Most of your home care instructions are the same as suggested for the first trimester of your  pregnancy. Keep your caregiver's appointments. Follow your caregiver's instructions regarding medicine use, exercise, and diet.  During pregnancy, you are providing food for you and your baby. Continue to eat regular, well-balanced meals. Choose foods such as meat, fish, milk and other low fat dairy products, vegetables, fruits, and whole-grain breads and cereals. Your caregiver will tell you of the ideal weight gain.  A physical sexual relationship may be continued up until near the end of pregnancy if there are no other problems. Problems could include early (premature) leaking of amniotic fluid from the membranes, vaginal bleeding, abdominal pain, or other medical or pregnancy problems.  Exercise regularly if there are no restrictions. Check with your caregiver if you are unsure of the safety of some of your exercises. The greatest weight gain will occur in the last 2 trimesters of pregnancy. Exercise will help you:  Control your weight.  Get you in shape for labor and delivery.  Lose weight after you have the baby.  Wear   a good support or jogging bra for breast tenderness during pregnancy. This may help if worn during sleep. Pads or tissues may be used in the bra if you are leaking colostrum.  Do not use hot tubs, steam rooms or saunas throughout the pregnancy.  Wear your seat belt at all times when driving. This protects you and your baby if you are in an accident.  Avoid raw meat, uncooked cheese, cat litter boxes, and soil used by cats. These carry germs that can cause birth defects in the baby.  The second trimester is also a good time to visit your dentist for your dental health if this has not been done yet. Getting your teeth cleaned is okay. Use a soft toothbrush. Brush gently during pregnancy.  It is easier to leak urine during pregnancy. Tightening up and strengthening the pelvic muscles will help with this problem. Practice stopping your urination while you are going to the  bathroom. These are the same muscles you need to strengthen. It is also the muscles you would use as if you were trying to stop from passing gas. You can practice tightening these muscles up 10 times a set and repeating this about 3 times per day. Once you know what muscles to tighten up, do not perform these exercises during urination. It is more likely to contribute to an infection by backing up the urine.  Ask for help if you have financial, counseling, or nutritional needs during pregnancy. Your caregiver will be able to offer counseling for these needs as well as refer you for other special needs.  Your skin may become oily. If so, wash your face with mild soap, use non-greasy moisturizer and oil or cream based makeup. MEDICINES AND DRUG USE IN PREGNANCY  Take prenatal vitamins as directed. The vitamin should contain 1 milligram of folic acid. Keep all vitamins out of reach of children. Only a couple vitamins or tablets containing iron may be fatal to a baby or young child when ingested.  Avoid use of all medicines, including herbs, over-the-counter medicines, not prescribed or suggested by your caregiver. Only take over-the-counter or prescription medicines for pain, discomfort, or fever as directed by your caregiver. Do not use aspirin.  Let your caregiver also know about herbs you may be using.  Alcohol is related to a number of birth defects. This includes fetal alcohol syndrome. All alcohol, in any form, should be avoided completely. Smoking will cause low birth rate and premature babies.  Street or illegal drugs are very harmful to the baby. They are absolutely forbidden. A baby born to an addicted mother will be addicted at birth. The baby will go through the same withdrawal an adult does. SEEK MEDICAL CARE IF:  You have any concerns or worries during your pregnancy. It is better to call with your questions if you feel they cannot wait, rather than worry about them. SEEK IMMEDIATE  MEDICAL CARE IF:   An unexplained oral temperature above 102 F (38.9 C) develops, or as your caregiver suggests.  You have leaking of fluid from the vagina (birth canal). If leaking membranes are suspected, take your temperature and tell your caregiver of this when you call.  There is vaginal spotting, bleeding, or passing clots. Tell your caregiver of the amount and how many pads are used. Light spotting in pregnancy is common, especially following intercourse.  You develop a bad smelling vaginal discharge with a change in the color from clear to white.  You continue to feel   sick to your stomach (nauseated) and have no relief from remedies suggested. You vomit blood or coffee ground-like materials.  You lose more than 2 pounds of weight or gain more than 2 pounds of weight over 1 week, or as suggested by your caregiver.  You notice swelling of your face, hands, feet, or legs.  You get exposed to [place] measles and have never had them.  You are exposed to fifth disease or chickenpox.  You develop belly (abdominal) pain. Round ligament discomfort is a common non-cancerous (benign) cause of abdominal pain in pregnancy. Your caregiver still must evaluate you.  You develop a bad headache that does not go away.  You develop fever, diarrhea, pain with urination, or shortness of breath.  You develop visual problems, blurry, or double vision.  You fall or are in a car accident or any kind of trauma.  There is mental or physical violence at home. Document Released: 05/11/2001 Document Revised: 02/09/2012 Document Reviewed: 11/13/2008 [facility] Patient Information 2014 [place].  Breastfeeding A change in hormones during your pregnancy causes growth of your breast tissue and an increase in number and size of milk ducts. The hormone prolactin allows proteins, sugars, and fats from your blood supply to make breast milk in your milk-producing glands. The hormone progesterone prevents  breast milk from being released before the birth of your baby. After the birth of your baby, your progesterone level decreases allowing breast milk to be released. Thoughts of your baby, as well as his or her sucking or crying, can stimulate the release of milk from the milk-producing glands. Deciding to breastfeed (nurse) is one of the best choices you can make for you and your baby. The information that follows gives a brief review of the benefits, as well as other important skills to know about breastfeeding. BENEFITS OF BREASTFEEDING For your baby  The first milk (colostrum) helps your baby's digestive system function better.   There are antibodies in your milk that help your baby fight off infections.   Your baby has a lower incidence of asthma, allergies, and sudden infant death syndrome (SIDS).   The nutrients in breast milk are better for your baby than infant formulas.  Breast milk improves your baby's brain development.   Your baby will have less gas, colic, and constipation.  Your baby is less likely to develop other conditions, such as childhood obesity, asthma, or diabetes mellitus. For you  Breastfeeding helps develop a very special bond between you and your baby.   Breastfeeding is convenient, always available at the correct temperature, and costs nothing.   Breastfeeding helps to burn calories and helps you lose the weight gained during pregnancy.   Breastfeeding makes your uterus contract back down to normal size faster and slows bleeding following delivery.   Breastfeeding mothers have a lower risk of developing osteoporosis or breast or ovarian cancer later in life.  BREASTFEEDING FREQUENCY  A healthy, full-term baby may breastfeed as often as every hour or space his or her feedings to every 3 hours. Breastfeeding frequency will vary from baby to baby.   Newborns should be fed no less than every 2 3 hours during the day and every 4 5 hours during the  night. You should breastfeed a minimum of 8 feedings in a 24 hour period.  Awaken your baby to breastfeed if it has been 3 4 hours since the last feeding.  Breastfeed when you feel the need to reduce the fullness of your breasts or when  your newborn shows signs of hunger. Signs that your baby may be hungry include:  Increased alertness or activity.  Stretching.  Movement of the head from side to side.  Movement of the head and opening of the mouth when the corner of the mouth or cheek is stroked (rooting).  Increased sucking sounds, smacking lips, cooing, sighing, or squeaking.  Hand-to-mouth movements.  Increased sucking of fingers or hands.  Fussing.  Intermittent crying.  Signs of extreme hunger will require calming and consoling before you try to feed your baby. Signs of extreme hunger may include:  Restlessness.  A loud, strong cry.  Screaming.  Frequent feeding will help you make more milk and will help prevent problems, such as sore nipples and engorgement of the breasts.  BREASTFEEDING   Whether lying down or sitting, be sure that the baby's abdomen is facing your abdomen.   Support your breast with 4 fingers under your breast and your thumb above your nipple. Make sure your fingers are well away from your nipple and your baby's mouth.   Stroke your baby's lips gently with your finger or nipple.   When your baby's mouth is open wide enough, place all of your nipple and as much of the colored area around your nipple (areola) as possible into your baby's mouth.  More areola should be visible above his or her upper lip than below his or her lower lip.  Your baby's tongue should be between his or her lower gum and your breast.  Ensure that your baby's mouth is correctly positioned around the nipple (latched). Your baby's lips should create a seal on your breast.  Signs that your baby has effectively latched onto your nipple include:  Tugging or sucking  without pain.  Swallowing heard between sucks.  Absent click or smacking sound.  Muscle movement above and in front of his or her ears with sucking.  Your baby must suck about 2 3 minutes in order to get your milk. Allow your baby to feed on each breast as long as he or she wants. Nurse your baby until he or she unlatches or falls asleep at the first breast, then offer the second breast.  Signs that your baby is full and satisfied include:  A gradual decrease in the number of sucks or complete cessation of sucking.  Falling asleep.  Extension or relaxation of his or her body.  Retention of a small amount of milk in his or her mouth.  Letting go of your breast by himself or herself.  Signs of effective breastfeeding in you include:  Breasts that have increased firmness, weight, and size prior to feeding.  Breasts that are softer after nursing.  Increased milk volume, as well as a change in milk consistency and color by the 5th day of breastfeeding.  Breast fullness relieved by breastfeeding.  Nipples are not sore, cracked, or bleeding.  If needed, break the suction by putting your finger into the corner of your baby's mouth and sliding your finger between his or her gums. Then, remove your breast from his or her mouth.  It is common for babies to spit up a small amount after a feeding.  Babies often swallow air during feeding. This can make babies fussy. Burping your baby between breasts can help with this.  Vitamin D supplements are recommended for babies who get only breast milk.  Avoid using a pacifier during your baby's first 4 6 weeks.  Avoid supplemental feedings of water, formula, or  juice in place of breastfeeding. Breast milk is all the food your baby needs. It is not necessary for your baby to have water or formula. Your breasts will make more milk if supplemental feedings are avoided during the early weeks. HOW TO TELL WHETHER YOUR BABY IS GETTING ENOUGH BREAST  MILK Wondering whether or not your baby is getting enough milk is a common concern among mothers. You can be assured that your baby is getting enough milk if:   Your baby is actively sucking and you hear swallowing.   Your baby seems relaxed and satisfied after a feeding.   Your baby nurses at least 8 12 times in a 24 hour time period.  During the first 56 35 days of age:  Your baby is wetting at least 3 5 diapers in a 24 hour period. The urine should be clear and pale yellow.  Your baby is having at least 3 4 stools in a 24 hour period. The stool should be soft and yellow.  At 80 109 days of age, your baby is having at least 3 6 stools in a 24 hour period. The stool should be seedy and yellow by 21 days of age.  Your baby has a weight loss less than 7 10% during the first 37 days of age.  Your baby does not lose weight after 7 26 days of age.  Your baby gains 4 7 ounces each week after he or she is 56 days of age.  Your baby gains weight by 91 days of age and is back to birth weight within 2 weeks. ENGORGEMENT In the first week after your baby is born, you may experience extremely full breasts (engorgement). When engorged, your breasts may feel heavy, warm, or tender to the touch. Engorgement peaks within 24 48 hours after delivery of your baby.  Engorgement may be reduced by:  Continuing to breastfeed.  Increasing the frequency of breastfeeding.  Taking warm showers or applying warm, moist heat to your breasts just before each feeding. This increases circulation and helps the milk flow.   Gently massaging your breast before and during the feedings. With your fingertips, massage from your chest wall towards your nipple in a circular motion.   Ensuring that your baby empties at least one breast at every feeding. It also helps to start the next feeding on the opposite breast.   Expressing breast milk by hand or by using a breast pump to empty the breasts if your baby is sleepy, or  not nursing well. You may also want to express milk if you are returning to work oryou feel you are getting engorged.  Ensuring your baby is latched on and positioned properly while breastfeeding. If you follow these suggestions, your engorgement should improve in 24 48 hours. If you are still experiencing difficulty, call your lactation consultant or caregiver.  CARING FOR YOURSELF Take care of your breasts.  Bathe or shower daily.   Avoid using soap on your nipples.   Wear a supportive bra. Avoid wearing underwire style bras.  Air dry your nipples for a 3 28minutes after each feeding.   Use only cotton bra pads to absorb breast milk leakage. Leaking of breast milk between feedings is normal.   Use only pure lanolin on your nipples after nursing. You do not need to wash it off before feeding your baby again. Another option is to express a few drops of breast milk and gently massage that milk into your nipples.  Continue  breast self-awareness checks. Take care of yourself.  Eat healthy foods. Alternate 3 meals with 3 snacks.  Avoid foods that you notice affect your baby in a bad way.  Drink milk, fruit juice, and water to satisfy your thirst (about 8 glasses a day).   Rest often, relax, and take your prenatal vitamins to prevent fatigue, stress, and anemia.  Avoid chewing and smoking tobacco.  Avoid alcohol and drug use.  Take over-the-counter and prescribed medicine only as directed by your caregiver or pharmacist. You should always check with your caregiver or pharmacist before taking any new medicine, vitamin, or herbal supplement.  Know that pregnancy is possible while breastfeeding. If desired, talk to your caregiver about family planning and safe birth control methods that may be used while breastfeeding. SEEK MEDICAL CARE IF:   You feel like you want to stop breastfeeding or have become frustrated with breastfeeding.  You have painful breasts or nipples.  Your  nipples are cracked or bleeding.  Your breasts are red, tender, or warm.  You have a swollen area on either breast.  You have a fever or chills.  You have nausea or vomiting.  You have drainage from your nipples.  Your breasts do not become full before feedings by the 5th day after delivery.  You feel sad and depressed.  Your baby is too sleepy to eat well.  Your baby is having trouble sleeping.   Your baby is wetting less than 3 diapers in a 24 hour period.  Your baby has less than 3 stools in a 24 hour period.  Your baby's skin or the white part of his or her eyes becomes more yellow.   Your baby is not gaining weight by 58 days of age. MAKE SURE YOU:   Understand these instructions.  Will watch your condition.  Will get help right away if you are not doing well or get worse. Document Released: 05/17/2005 Document Revised: 02/09/2012 Document Reviewed: 12/22/2011 [facility] Patient Information 2014 [place].

## 2013-02-06 LAB — GC/CHLAMYDIA PROBE AMP: GC Probe RNA: NEGATIVE

## 2013-02-06 LAB — WET PREP, GENITAL
Clue Cells Wet Prep HPF POC: NONE SEEN
WBC, Wet Prep HPF POC: NONE SEEN
Yeast Wet Prep HPF POC: NONE SEEN

## 2013-02-07 ENCOUNTER — Telehealth (HOSPITAL_COMMUNITY): Payer: Self-pay | Admitting: MS"

## 2013-02-07 ENCOUNTER — Encounter: Payer: Self-pay | Admitting: *Deleted

## 2013-02-07 NOTE — Telephone Encounter (Signed)
Called Ann Robertson to discuss her cell free fetal DNA test results.  [name] had Panorama testing through [facility].  Testing was offered because of positive Down syndrome risk from first trimester screening.   The patient was identified by name and DOB.  We reviewed that these are within normal limits, showing a less than 1 in 10,000 risk for trisomies 21, 18 and 13, and monosomy X (Turner syndrome).  In addition, the risk for triploidy/vanishing twin and sex chromosome trisomies (47,XXX and 47,XXY) was also low risk.  We reviewed that this testing identifies > 99% of pregnancies with trisomy 61, trisomy 32, trisomy 72, sex chromosome trisomies (47,XXX and 47,XXY), and triploidy.  The detection rate for monosomy X is ~92%.  The false positive rate is <0.1% for all conditions. Testing was also consistent with female gender, which was disclosed to the patient and her partner, per their request.  She understands that this testing does not identify all genetic conditions.  All questions were answered to her satisfaction, she was encouraged to call with additional questions or concerns.  Chipper Oman, MS Certified Genetic Counselor 02/07/2013 4:43 PM

## 2013-02-07 NOTE — Telephone Encounter (Signed)
Left message for patient to return call.   Santiago Glad Mare Ludtke 02/07/2013 2:10 PM

## 2013-02-08 ENCOUNTER — Telehealth: Payer: Self-pay

## 2013-02-08 MED ORDER — AZITHROMYCIN 500 MG PO TABS: 1000.0000 mg | ORAL_TABLET | Freq: Once | ORAL | Status: DC

## 2013-02-08 NOTE — Telephone Encounter (Signed)
Called pt and informed pt of results and informed her that we would send an Rx for tx to her [place] off [address].  I advised pt to make sure that she has her partner(s) treated as well.  And we will do a TOC in three weeks. Pt stated understanding and did not have any other questions.

## 2013-02-08 NOTE — Telephone Encounter (Signed)
Message copied by Michel Harrow on Thu Feb 08, 2013  1:05 PM ------      Message from: Donnamae Jude      Created: Tue Feb 06, 2013  1:53 PM       POs for chlam---needs rx with Zithromax 1 gm po x 1.  Partner needs treatment.  Needs TOC 3 wks. ------

## 2013-02-13 ENCOUNTER — Encounter: Payer: Self-pay | Admitting: *Deleted

## 2013-02-13 DIAGNOSIS — O24919 Unspecified diabetes mellitus in pregnancy, unspecified trimester: Secondary | ICD-10-CM

## 2013-02-16 ENCOUNTER — Emergency Department (HOSPITAL_BASED_OUTPATIENT_CLINIC_OR_DEPARTMENT_OTHER)
Admission: EM | Admit: 2013-02-16 | Discharge: 2013-02-16 | Disposition: A | Discharge status: Home / Self Care | Payer: Medicaid Other | Attending: Emergency Medicine | Admitting: Emergency Medicine

## 2013-02-16 ENCOUNTER — Encounter (HOSPITAL_BASED_OUTPATIENT_CLINIC_OR_DEPARTMENT_OTHER): Payer: Self-pay

## 2013-02-16 DIAGNOSIS — Z8669 Personal history of other diseases of the nervous system and sense organs: Secondary | ICD-10-CM | POA: Insufficient documentation

## 2013-02-16 DIAGNOSIS — E109 Type 1 diabetes mellitus without complications: Secondary | ICD-10-CM | POA: Insufficient documentation

## 2013-02-16 DIAGNOSIS — Z794 Long term (current) use of insulin: Secondary | ICD-10-CM | POA: Insufficient documentation

## 2013-02-16 DIAGNOSIS — Z79899 Other long term (current) drug therapy: Secondary | ICD-10-CM | POA: Insufficient documentation

## 2013-02-16 DIAGNOSIS — A749 Chlamydial infection, unspecified: Secondary | ICD-10-CM | POA: Insufficient documentation

## 2013-02-16 HISTORY — DX: Unspecified sexually transmitted disease: A64

## 2013-02-16 LAB — URINALYSIS, ROUTINE W REFLEX MICROSCOPIC
Glucose, UA: 500 mg/dL — AB
Leukocytes, UA: NEGATIVE
Protein, ur: NEGATIVE mg/dL
Specific Gravity, Urine: 1.025 (ref 1.005–1.030)
Urobilinogen, UA: 1 mg/dL (ref 0.0–1.0)

## 2013-02-16 MED ORDER — LIDOCAINE HCL (PF) 1 % IJ SOLN
INTRAMUSCULAR | Status: AC
  Administered 2013-02-16: 5 mL
  Filled 2013-02-16: qty 5

## 2013-02-16 MED ORDER — AZITHROMYCIN 250 MG PO TABS
1000.0000 mg | ORAL_TABLET | Freq: Once | ORAL | Status: AC
  Administered 2013-02-16: 1000 mg via ORAL
  Filled 2013-02-16: qty 4

## 2013-02-16 MED ORDER — CEFTRIAXONE SODIUM 250 MG IJ SOLR
250.0000 mg | Freq: Once | INTRAMUSCULAR | Status: AC
  Administered 2013-02-16: 250 mg via INTRAMUSCULAR
  Filled 2013-02-16: qty 250

## 2013-02-16 NOTE — ED Provider Notes (Signed)
CSN: [id number]     Arrival date & time 02/16/13  1047 History   First MD Initiated Contact with Patient 02/16/13 1132     Chief Complaint  Patient presents with  . Exposure to STD   (Consider location/radiation/quality/duration/timing/severity/associated sxs/prior Treatment) HPI Patient was treated for an STD one month ago. Her culture came back positive for Chlamydia. Patient states that her symptoms of vaginal discharge had initially improved. She then had unprotected sex with a partner who had not  been treated. She states her vaginal discharge has returned. She complains of no pain, fevers chills, vaginal bleeding, urinary symptoms. She is requesting repeat dosing of her antibiotics. Past Medical History  Diagnosis Date  . Diabetes mellitus   . Juvenile diabetes mellitus   . Juvenile diabetes 2000  . Neuropathy   . High cholesterol   . HSV-2 infection   . STD (sexually transmitted disease)    Past Surgical History  Procedure Laterality Date  . Cesarean section     Family History  Problem Relation Age of Onset  . Stroke Mother   . Hypertension Mother   . Heart disease Mother   . Kidney disease Mother   . Hyperlipidemia Father   . Diabetes Father    History  Substance Use Topics  . Smoking status: Never Smoker   . Smokeless tobacco: Not on file  . Alcohol Use: No   OB History   Grav Para Term Preterm Abortions TAB SAB Ect Mult Living   3 1 0 1 1 0 1 0 0 1      Review of Systems  Constitutional: Negative for fever and chills.  Gastrointestinal: Negative for nausea, vomiting and abdominal pain.  Genitourinary: Positive for vaginal discharge. Negative for dysuria, urgency, frequency, hematuria, flank pain, vaginal bleeding, difficulty urinating, vaginal pain and pelvic pain.  Musculoskeletal: Negative for back pain.  All other systems reviewed and are negative.    Allergies  Hydromorphone hcl  Home Medications   Current Outpatient Rx  Name  Route  Sig   Dispense  Refill  . azithromycin (ZITHROMAX) 500 MG tablet   Oral   Take 2 tablets (1,000 mg total) by mouth once.   2 tablet   0   . insulin lispro (HUMALOG) 100 UNIT/ML injection   Subcutaneous   Inject 2-14 Units into the skin 4 (four) times daily. Correction scale: fasting and 2 hrs after first bite of each meal: Fasting @ 8am and 2 hrs after the start of each meal 121-160--2 units 161-200--4 units 201-240--6 units 241-280--8 units 281-320--10 units 321-360--12 units 361-420--14 units (call MD if symptomatic-headache, frequent urination, excessive thirst         . insulin lispro (HUMALOG) 100 UNIT/ML injection   Subcutaneous   Inject into the skin 3 (three) times daily before meals.         . insulin NPH (HUMULIN N,NOVOLIN N) 100 UNIT/ML injection   Subcutaneous   Inject 8 Units into the skin 2 (two) times daily at 8 am and 10 pm.          . Prenatal Vit-Fe Fumarate-FA (PRENATAL COMPLETE) 14-0.4 MG TABS   Oral   Take 1 tablet by mouth daily.   60 each   2    BP 125/83  Pulse 99  Temp(Src) 98 F (36.7 C) (Oral)  Resp 18  SpO2 99%  LMP 10/28/2012 Physical Exam  Nursing note and vitals reviewed. Constitutional: She is oriented to person, place, and time. She appears well-developed and  well-nourished. No distress.  HENT:  Head: Normocephalic and atraumatic.  Mouth/Throat: Oropharynx is clear and moist.  Eyes: EOM are normal. Pupils are equal, round, and reactive to light.  Neck: Normal range of motion. Neck supple.  Cardiovascular: Normal rate and regular rhythm.   Pulmonary/Chest: Effort normal and breath sounds normal. No respiratory distress. She has no wheezes. She has no rales.  Abdominal: Soft. Bowel sounds are normal. She exhibits no distension and no mass. There is no tenderness. There is no rebound and no guarding.  Genitourinary:  Pelvic exam deferred due to recent exam and patient preference  Musculoskeletal: Normal range of motion. She exhibits  no edema and no tenderness.  Neurological: She is alert and oriented to person, place, and time.  Skin: Skin is warm and dry. No rash noted. No erythema.  Psychiatric: She has a normal mood and affect. Her behavior is normal.    ED Course  Procedures (including critical care time) Labs Review Labs Reviewed  URINALYSIS, ROUTINE W REFLEX MICROSCOPIC - Abnormal; Notable for the following:    APPearance CLOUDY (*)    Glucose, UA 500 (*)    All other components within normal limits   Imaging Review No results found.  MDM   1. Chlamydia    Patient was treated in the emergency department. She is advised to have all her sexual partners treated. Patient urged to return to the emergency apartment for abdominal pain, fevers, chills, nausea, vomiting, vaginal bleeding or any concerns.    Julianne Rice, MD 02/16/13 [id number]

## 2013-02-16 NOTE — ED Notes (Signed)
Pt here for STD treatment.  Pt was seen 1 month ago for same but did not have RX filled.

## 2013-02-20 ENCOUNTER — Telehealth: Payer: Self-pay | Admitting: *Deleted

## 2013-02-20 ENCOUNTER — Encounter: Payer: Self-pay | Admitting: *Deleted

## 2013-02-20 NOTE — Telephone Encounter (Signed)
Pt left message requesting information as to when she will receive her shot. She stated that she is 16 wks now.  I returned pt's call and left message that we will complete a form to obtain her medication. She will need to come to the clinic to sign the form. She may come in today by 2pm or any time tomorrow from 0800-1600. We should have her medication by next week. We will schedule Ob fu appt on 9/29.   **Note:  appt scheduled 02/26/13 @ 1015     Pt's mother called back at 79 and stated that Ann Robertson will come to the clinic today.

## 2013-02-26 ENCOUNTER — Encounter: Payer: Self-pay | Admitting: Obstetrics and Gynecology

## 2013-02-26 ENCOUNTER — Ambulatory Visit (INDEPENDENT_AMBULATORY_CARE_PROVIDER_SITE_OTHER): Payer: Medicaid Other | Admitting: Obstetrics and Gynecology

## 2013-02-26 VITALS — BP 127/76 | Temp 97.1°F | Wt 142.0 lb

## 2013-02-26 DIAGNOSIS — O34219 Maternal care for unspecified type scar from previous cesarean delivery: Secondary | ICD-10-CM

## 2013-02-26 DIAGNOSIS — E1042 Type 1 diabetes mellitus with diabetic polyneuropathy: Secondary | ICD-10-CM

## 2013-02-26 DIAGNOSIS — A568 Sexually transmitted chlamydial infection of other sites: Secondary | ICD-10-CM

## 2013-02-26 DIAGNOSIS — O98319 Other infections with a predominantly sexual mode of transmission complicating pregnancy, unspecified trimester: Secondary | ICD-10-CM

## 2013-02-26 DIAGNOSIS — G909 Disorder of the autonomic nervous system, unspecified: Secondary | ICD-10-CM

## 2013-02-26 DIAGNOSIS — E1049 Type 1 diabetes mellitus with other diabetic neurological complication: Secondary | ICD-10-CM

## 2013-02-26 DIAGNOSIS — O24912 Unspecified diabetes mellitus in pregnancy, second trimester: Secondary | ICD-10-CM

## 2013-02-26 DIAGNOSIS — A749 Chlamydial infection, unspecified: Secondary | ICD-10-CM

## 2013-02-26 DIAGNOSIS — O24919 Unspecified diabetes mellitus in pregnancy, unspecified trimester: Secondary | ICD-10-CM

## 2013-02-26 DIAGNOSIS — O09219 Supervision of pregnancy with history of pre-term labor, unspecified trimester: Secondary | ICD-10-CM

## 2013-02-26 LAB — POCT URINALYSIS DIP (DEVICE)
Bilirubin Urine: NEGATIVE
Glucose, UA: 500 mg/dL — AB
Hgb urine dipstick: NEGATIVE
Specific Gravity, Urine: 1.025 (ref 1.005–1.030)
Urobilinogen, UA: 0.2 mg/dL (ref 0.0–1.0)
pH: 7 (ref 5.0–8.0)

## 2013-02-26 MED ORDER — HYDROXYPROGESTERONE CAPROATE 250 MG/ML IM OIL
250.0000 mg | TOPICAL_OIL | INTRAMUSCULAR | Status: DC
  Administered 2013-02-26 – 2013-07-02 (×14): 250 mg via INTRAMUSCULAR

## 2013-02-26 NOTE — Progress Notes (Signed)
Patient doing well without complaints. Patient did not bring CBG log but reports high values in the 400 range. Patient missed her endocrinology appointment 3 days ago and plans to reschedule. Reviewed importance of maintaining good glycemic control during pregnancy to avoid complications such as fetal death. Patient verbalized understanding. Patient declined to have AFP drawn today and desires to have it done at next visit.

## 2013-02-26 NOTE — Addendum Note (Signed)
Addended by: Christiana Pellant A on: 02/26/2013 11:38 AM   Modules accepted: Orders

## 2013-02-26 NOTE — Progress Notes (Signed)
P-90 

## 2013-03-01 ENCOUNTER — Encounter: Payer: Self-pay | Admitting: *Deleted

## 2013-03-01 DIAGNOSIS — O24919 Unspecified diabetes mellitus in pregnancy, unspecified trimester: Secondary | ICD-10-CM

## 2013-03-05 ENCOUNTER — Ambulatory Visit (INDEPENDENT_AMBULATORY_CARE_PROVIDER_SITE_OTHER): Payer: Medicaid Other | Admitting: *Deleted

## 2013-03-05 VITALS — Wt 142.0 lb

## 2013-03-05 DIAGNOSIS — O24919 Unspecified diabetes mellitus in pregnancy, unspecified trimester: Secondary | ICD-10-CM

## 2013-03-05 DIAGNOSIS — O24912 Unspecified diabetes mellitus in pregnancy, second trimester: Secondary | ICD-10-CM

## 2013-03-06 LAB — AFP, QUAD SCREEN
AFP: 30.9 IU/mL
Age Alone: 1:1060 {titer}
Curr Gest Age: 18.1 wks.days
Down Syndrome Scr Risk Est: 1:3390 {titer}
HCG, Total: 32677 m[IU]/mL
Interpretation-AFP: NEGATIVE
MoM for AFP: 0.85
MoM for INH: 0.8
MoM for hCG: 1.65
Open Spina bifida: NEGATIVE
Tri 18 Scr Risk Est: NEGATIVE
uE3 Mom: 0.92

## 2013-03-08 ENCOUNTER — Ambulatory Visit (HOSPITAL_COMMUNITY)
Admission: RE | Admit: 2013-03-08 | Discharge: 2013-03-08 | Disposition: A | Discharge status: Home / Self Care | Payer: Medicaid Other | Source: Ambulatory Visit | Attending: Obstetrics and Gynecology | Admitting: Obstetrics and Gynecology

## 2013-03-08 ENCOUNTER — Encounter: Payer: Self-pay | Admitting: Obstetrics and Gynecology

## 2013-03-08 VITALS — BP 120/73 | HR 106 | Wt 144.0 lb

## 2013-03-08 DIAGNOSIS — O34219 Maternal care for unspecified type scar from previous cesarean delivery: Secondary | ICD-10-CM | POA: Insufficient documentation

## 2013-03-08 DIAGNOSIS — O289 Unspecified abnormal findings on antenatal screening of mother: Secondary | ICD-10-CM | POA: Insufficient documentation

## 2013-03-08 DIAGNOSIS — O24912 Unspecified diabetes mellitus in pregnancy, second trimester: Secondary | ICD-10-CM

## 2013-03-08 DIAGNOSIS — O24919 Unspecified diabetes mellitus in pregnancy, unspecified trimester: Secondary | ICD-10-CM | POA: Insufficient documentation

## 2013-03-08 NOTE — Progress Notes (Signed)
Ann Robertson  was seen today for an ultrasound appointment.  See full report in [facility].  Impression: Single IUP at 18 4/7 weeks Pregestational diabetes (Class C) - followed by endocrinologist First trimester screen with 1:91 DSR, normal NIPS (low PAPP-A) Normal detailed fetal anatomy; somewhat limited views of the fetal heart obtained No markers associated with aneuploidy noted Normal amniotic fluid volume  Recommendations: Fetal echo (scheduled) Recommend follow up in 4 weeks for interval growth and anatomy.  Benjaman Lobe, MD

## 2013-03-12 ENCOUNTER — Ambulatory Visit (INDEPENDENT_AMBULATORY_CARE_PROVIDER_SITE_OTHER): Payer: Medicaid Other | Admitting: Obstetrics and Gynecology

## 2013-03-12 VITALS — BP 118/83 | Temp 97.0°F | Wt 145.4 lb

## 2013-03-12 DIAGNOSIS — O09219 Supervision of pregnancy with history of pre-term labor, unspecified trimester: Secondary | ICD-10-CM

## 2013-03-12 DIAGNOSIS — O24919 Unspecified diabetes mellitus in pregnancy, unspecified trimester: Secondary | ICD-10-CM

## 2013-03-12 DIAGNOSIS — IMO0002 Reserved for concepts with insufficient information to code with codable children: Secondary | ICD-10-CM

## 2013-03-12 LAB — POCT URINALYSIS DIP (DEVICE)
Hgb urine dipstick: NEGATIVE
Ketones, ur: NEGATIVE mg/dL
Protein, ur: NEGATIVE mg/dL
Specific Gravity, Urine: 1.025 (ref 1.005–1.030)
Urobilinogen, UA: 0.2 mg/dL (ref 0.0–1.0)
pH: 7 (ref 5.0–8.0)

## 2013-03-12 NOTE — Progress Notes (Signed)
P= 104 Pt. C/o of lower abdominal/pelvis pressure.  Refused flu vaccine today; states maybe next time.

## 2013-03-12 NOTE — Patient Instructions (Signed)

## 2013-03-12 NOTE — Progress Notes (Signed)
Labile Type 1 Class C. Saw endocrinologist (who adjusts insulin) last week. No log book but reports A1c down to 9 from 14 and highest CBG 400, with lows down to 37 @4  am. Has appointment with endocrine in 3 wks. Declines to see DM counselor or get CBG here today. 10/9 had [place] with MFM and fetal echo scheduled. Early genetic screen and AFP normal.  Pelvic and groin pressure with walking. For 17-P today and weekly. Flu shot today> decliined.

## 2013-03-12 NOTE — Progress Notes (Signed)
Patient had an anatomy U/S with MFM on 03/08/13. They scheduled a Feta Echo with [facility] on 03/29/13 at 10 am. Patient will make an appointment with her own opthamologist for a diabetic eye exam. Fax and Tel. # given to patient to have them send her report.

## 2013-03-19 ENCOUNTER — Ambulatory Visit (INDEPENDENT_AMBULATORY_CARE_PROVIDER_SITE_OTHER): Payer: Medicaid Other | Admitting: Obstetrics and Gynecology

## 2013-03-19 VITALS — BP 125/83 | HR 89

## 2013-03-19 DIAGNOSIS — O09212 Supervision of pregnancy with history of pre-term labor, second trimester: Secondary | ICD-10-CM

## 2013-03-19 DIAGNOSIS — O09219 Supervision of pregnancy with history of pre-term labor, unspecified trimester: Secondary | ICD-10-CM

## 2013-03-26 ENCOUNTER — Ambulatory Visit (INDEPENDENT_AMBULATORY_CARE_PROVIDER_SITE_OTHER): Payer: Medicaid Other

## 2013-03-26 VITALS — BP 135/78 | HR 97 | Wt 150.6 lb

## 2013-03-26 DIAGNOSIS — O09219 Supervision of pregnancy with history of pre-term labor, unspecified trimester: Secondary | ICD-10-CM

## 2013-04-02 ENCOUNTER — Ambulatory Visit (INDEPENDENT_AMBULATORY_CARE_PROVIDER_SITE_OTHER): Payer: Medicaid Other | Admitting: Obstetrics & Gynecology

## 2013-04-02 VITALS — BP 138/82 | Wt 153.0 lb

## 2013-04-02 DIAGNOSIS — O24919 Unspecified diabetes mellitus in pregnancy, unspecified trimester: Secondary | ICD-10-CM

## 2013-04-02 DIAGNOSIS — O34219 Maternal care for unspecified type scar from previous cesarean delivery: Secondary | ICD-10-CM

## 2013-04-02 DIAGNOSIS — E1042 Type 1 diabetes mellitus with diabetic polyneuropathy: Secondary | ICD-10-CM

## 2013-04-02 DIAGNOSIS — G909 Disorder of the autonomic nervous system, unspecified: Secondary | ICD-10-CM

## 2013-04-02 DIAGNOSIS — O9989 Other specified diseases and conditions complicating pregnancy, childbirth and the puerperium: Secondary | ICD-10-CM

## 2013-04-02 DIAGNOSIS — E1049 Type 1 diabetes mellitus with other diabetic neurological complication: Secondary | ICD-10-CM

## 2013-04-02 DIAGNOSIS — O09219 Supervision of pregnancy with history of pre-term labor, unspecified trimester: Secondary | ICD-10-CM

## 2013-04-02 DIAGNOSIS — O24912 Unspecified diabetes mellitus in pregnancy, second trimester: Secondary | ICD-10-CM

## 2013-04-02 DIAGNOSIS — N898 Other specified noninflammatory disorders of vagina: Secondary | ICD-10-CM

## 2013-04-02 LAB — POCT URINALYSIS DIP (DEVICE)
Glucose, UA: 500 mg/dL — AB
Hgb urine dipstick: NEGATIVE
Nitrite: NEGATIVE
Protein, ur: NEGATIVE mg/dL
Urobilinogen, UA: 0.2 mg/dL (ref 0.0–1.0)
pH: 7 (ref 5.0–8.0)

## 2013-04-02 NOTE — Progress Notes (Signed)
Pulse:  99 Has a discharge that has odor.

## 2013-04-02 NOTE — Progress Notes (Signed)
Reports that ECHO was normal, will await records. Follow up scan next week.  CBGs are haphazard from 70s to 300s, recommended admission but patient declines saying that her endocrinologist will manage her sugars. Very resistant to our involvement in her DM care. I asked if her endocrinologist was considering insulin pump, she said " We talked about it".  Discussed increased maternal-fetal morbidity and mortality with inadequate glycemic control.   Continue weekly 17P.  No other complaints or concerns.  Routine obstetric precautions reviewed.

## 2013-04-02 NOTE — Patient Instructions (Signed)
Return to clinic for any obstetric concerns or go to [facility] for evaluation  

## 2013-04-03 LAB — WET PREP, GENITAL: Yeast Wet Prep HPF POC: NONE SEEN

## 2013-04-09 ENCOUNTER — Ambulatory Visit (INDEPENDENT_AMBULATORY_CARE_PROVIDER_SITE_OTHER): Payer: Medicaid Other

## 2013-04-09 DIAGNOSIS — O09219 Supervision of pregnancy with history of pre-term labor, unspecified trimester: Secondary | ICD-10-CM

## 2013-04-11 ENCOUNTER — Ambulatory Visit (HOSPITAL_COMMUNITY)
Admission: RE | Admit: 2013-04-11 | Discharge: 2013-04-11 | Disposition: A | Discharge status: Home / Self Care | Payer: Medicaid Other | Source: Ambulatory Visit | Attending: Obstetrics & Gynecology | Admitting: Obstetrics & Gynecology

## 2013-04-11 ENCOUNTER — Encounter (HOSPITAL_COMMUNITY): Payer: Self-pay

## 2013-04-11 VITALS — BP 118/74 | HR 95 | Wt 154.0 lb

## 2013-04-11 DIAGNOSIS — O289 Unspecified abnormal findings on antenatal screening of mother: Secondary | ICD-10-CM | POA: Insufficient documentation

## 2013-04-11 DIAGNOSIS — O2341 Unspecified infection of urinary tract in pregnancy, first trimester: Secondary | ICD-10-CM

## 2013-04-11 DIAGNOSIS — O24919 Unspecified diabetes mellitus in pregnancy, unspecified trimester: Secondary | ICD-10-CM | POA: Insufficient documentation

## 2013-04-11 DIAGNOSIS — O34219 Maternal care for unspecified type scar from previous cesarean delivery: Secondary | ICD-10-CM | POA: Insufficient documentation

## 2013-04-11 DIAGNOSIS — O24912 Unspecified diabetes mellitus in pregnancy, second trimester: Secondary | ICD-10-CM

## 2013-04-11 DIAGNOSIS — A749 Chlamydial infection, unspecified: Secondary | ICD-10-CM

## 2013-04-11 DIAGNOSIS — E1042 Type 1 diabetes mellitus with diabetic polyneuropathy: Secondary | ICD-10-CM

## 2013-04-16 ENCOUNTER — Ambulatory Visit (INDEPENDENT_AMBULATORY_CARE_PROVIDER_SITE_OTHER): Payer: Medicaid Other | Admitting: General Practice

## 2013-04-16 VITALS — BP 118/83 | HR 104 | Temp 98.1°F | Ht 64.0 in | Wt 156.4 lb

## 2013-04-16 DIAGNOSIS — O09219 Supervision of pregnancy with history of pre-term labor, unspecified trimester: Secondary | ICD-10-CM

## 2013-04-23 ENCOUNTER — Ambulatory Visit (INDEPENDENT_AMBULATORY_CARE_PROVIDER_SITE_OTHER): Payer: Medicaid Other | Admitting: Obstetrics & Gynecology

## 2013-04-23 VITALS — BP 124/85 | Temp 97.9°F | Wt 157.6 lb

## 2013-04-23 DIAGNOSIS — E1042 Type 1 diabetes mellitus with diabetic polyneuropathy: Secondary | ICD-10-CM

## 2013-04-23 DIAGNOSIS — O09219 Supervision of pregnancy with history of pre-term labor, unspecified trimester: Secondary | ICD-10-CM

## 2013-04-23 DIAGNOSIS — O34219 Maternal care for unspecified type scar from previous cesarean delivery: Secondary | ICD-10-CM

## 2013-04-23 DIAGNOSIS — G909 Disorder of the autonomic nervous system, unspecified: Secondary | ICD-10-CM

## 2013-04-23 DIAGNOSIS — E1049 Type 1 diabetes mellitus with other diabetic neurological complication: Secondary | ICD-10-CM

## 2013-04-23 DIAGNOSIS — O24912 Unspecified diabetes mellitus in pregnancy, second trimester: Secondary | ICD-10-CM

## 2013-04-23 DIAGNOSIS — O24919 Unspecified diabetes mellitus in pregnancy, unspecified trimester: Secondary | ICD-10-CM

## 2013-04-23 LAB — POCT URINALYSIS DIP (DEVICE)
Hgb urine dipstick: NEGATIVE
Protein, ur: 30 mg/dL — AB
Specific Gravity, Urine: >=1.03 (ref 1.005–1.030)
Urobilinogen, UA: 0.2 mg/dL (ref 0.0–1.0)
pH: 5.5 (ref 5.0–8.0)

## 2013-04-23 NOTE — Patient Instructions (Signed)
Return to clinic for any obstetric concerns or go to [facility] for evaluation Tetanus, Diphtheria, Pertussis (Tdap) Vaccine What You Need to Know WHY GET VACCINATED? Tetanus, diphtheria and pertussis can be very serious diseases, even for adolescents and adults. Tdap vaccine can protect [place] from these diseases. TETANUS (Lockjaw) causes painful muscle tightening and stiffness, usually all over the body.  It can lead to tightening of muscles in the head and neck so you can't open your mouth, swallow, or sometimes even breathe. Tetanus kills about 1 out of 5 people who are infected. DIPHTHERIA can cause a thick coating to form in the back of the throat.  It can lead to breathing problems, paralysis, heart failure, and death. PERTUSSIS (Whooping Cough) causes severe coughing spells, which can cause difficulty breathing, vomiting and disturbed sleep.  It can also lead to weight loss, incontinence, and rib fractures. Up to 2 in 100 adolescents and 5 in 100 adults with pertussis are hospitalized or have complications, which could include pneumonia and death. These diseases are caused by bacteria. Diphtheria and pertussis are spread from person to person through coughing or sneezing. Tetanus enters the body through cuts, scratches, or wounds. Before vaccines, the [place] saw as many as 200,000 cases a year of diphtheria and pertussis, and hundreds of cases of tetanus. Since vaccination began, tetanus and diphtheria have dropped by about 99% and pertussis by about 80%. TDAP VACCINE Tdap vaccine can protect adolescents and adults from tetanus, diphtheria, and pertussis. One dose of Tdap is routinely given at age 11 or 41. People who did not get Tdap at that age should get it as soon as possible. Tdap is especially important for health care professionals and anyone having close contact with a baby younger than 12 months. Pregnant women should get a dose of Tdap during every pregnancy, to protect the newborn  from pertussis. Infants are most at risk for severe, life-threatening complications from pertussis. A similar vaccine, called Td, protects from tetanus and diphtheria, but not pertussis. A Td booster should be given every 10 years. Tdap may be given as one of these boosters if you have not already gotten a dose. Tdap may also be given after a severe cut or burn to prevent tetanus infection. Your doctor can give you more information. Tdap may safely be given at the same time as other vaccines. SOME PEOPLE SHOULD NOT GET THIS VACCINE  If you ever had a life-threatening allergic reaction after a dose of any tetanus, diphtheria, or pertussis containing vaccine, OR if you have a severe allergy to any part of this vaccine, you should not get Tdap. Tell your doctor if you have any severe allergies.  If you had a coma, or long or multiple seizures within 7 days after a childhood dose of DTP or DTaP, you should not get Tdap, unless a cause other than the vaccine was found. You can still get Td.  Talk to your doctor if you:  have epilepsy or another nervous system problem,  had severe pain or swelling after any vaccine containing diphtheria, tetanus or pertussis,  ever had Guillain-Barr Syndrome (GBS),  aren't feeling well on the day the shot is scheduled. RISKS OF A VACCINE REACTION With any medicine, including vaccines, there is a chance of side effects. These are usually mild and go away on their own, but serious reactions are also possible. Brief fainting spells can follow a vaccination, leading to injuries from falling. Sitting or lying down for about 15 minutes can  help prevent these. Tell your doctor if you feel dizzy or light-headed, or have vision changes or ringing in the ears. Mild problems following Tdap (Did not interfere with activities)  Pain where the shot was given (about 3 in 4 adolescents or 2 in 3 adults)  Redness or swelling where the shot was given (about 1 person in 5)  Mild  fever of at least 100.84F (up to about 1 in 25 adolescents or 1 in 100 adults)  Headache (about 3 or 4 people in 10)  Tiredness (about 1 person in 3 or 4)  Nausea, vomiting, diarrhea, stomach ache (up to 1 in 4 adolescents or 1 in 10 adults)  Chills, body aches, sore joints, rash, swollen glands (uncommon) Moderate problems following Tdap (Interfered with activities, but did not require medical attention)  Pain where the shot was given (about 1 in 5 adolescents or 1 in 100 adults)  Redness or swelling where the shot was given (up to about 1 in 16 adolescents or 1 in 25 adults)  Fever over 102F (about 1 in 100 adolescents or 1 in 250 adults)  Headache (about 3 in 20 adolescents or 1 in 10 adults)  Nausea, vomiting, diarrhea, stomach ache (up to 1 or 3 people in 100)  Swelling of the entire arm where the shot was given (up to about 3 in 100). Severe problems following Tdap (Unable to perform usual activities, required medical attention)  Swelling, severe pain, bleeding and redness in the arm where the shot was given (rare). A severe allergic reaction could occur after any vaccine (estimated less than 1 in a million doses). WHAT IF THERE IS A SERIOUS REACTION? What should I look for?  Look for anything that concerns you, such as signs of a severe allergic reaction, very high fever, or behavior changes. Signs of a severe allergic reaction can include hives, swelling of the face and throat, difficulty breathing, a fast heartbeat, dizziness, and weakness. These would start a few minutes to a few hours after the vaccination. What should I do?  If you think it is a severe allergic reaction or other emergency that can't wait, call 9-1-1 or get the person to the nearest hospital. Otherwise, call your doctor.  Afterward, the reaction should be reported to the "Vaccine Adverse Event Reporting System" (VAERS). Your doctor might file this report, or you can do it yourself through the VAERS web  site at www.vaers.SamedayNews.es, or by calling 224 765 9380. VAERS is only for reporting reactions. They do not give medical advice.  THE NATIONAL VACCINE INJURY COMPENSATION PROGRAM The National Vaccine Injury Compensation Program (VICP) is a federal program that was created to compensate people who may have been injured by certain vaccines. Persons who believe they may have been injured by a vaccine can learn about the program and about filing a claim by calling 801 192 4950 or visiting the [place] website at GoldCloset.com.ee. HOW CAN I LEARN MORE?  Ask your doctor.  Call your local or state health department.  Contact the Centers for Disease Control and Prevention (CDC):  Call 214-002-7700 or visit CDC's website at http://hunter.com/. CDC Tdap Vaccine VIS (10/07/11) Document Released: 11/16/2011 Document Revised: 09/11/2012 Document Reviewed: 09/06/2012 [facility] Patient Information 2014 [place], [place].

## 2013-04-23 NOTE — Progress Notes (Signed)
Endocrinologist managing blood sugars, printout of blood sugars evaluated.Several blood sugars are in 200-300 range, some in 400 range, even fasting ones!  Recommended tighter control; recommended admission given the level of abnormalities but patient refuses.  She reports that her "doctor feels that her sugars are much better!".  She was asked what the normal range should be be in pregnancy; she said <120 for all values.  She was told her values are way above this limit, patient maintained that we do not manage her blood sugars, her endocrinologist does.  Her MFM consult appointment is on 05/09/13; will hope MFM has better luck in getting through to her.  Emphasized increased maternal-fetal morbidity with uncontrolled DM.  Continue weekly 17P.  No other complaints or concerns.  Fetal movement and labor precautions reviewed.

## 2013-04-23 NOTE — Progress Notes (Signed)
Pulse- 91 Patient reports right sided pain, is interested in abd binder; reports intense stomach pain last night after eating

## 2013-04-30 ENCOUNTER — Ambulatory Visit: Payer: Medicaid Other

## 2013-05-07 ENCOUNTER — Ambulatory Visit (INDEPENDENT_AMBULATORY_CARE_PROVIDER_SITE_OTHER): Payer: Medicaid Other | Admitting: Family Medicine

## 2013-05-07 ENCOUNTER — Encounter: Payer: Self-pay | Admitting: Family Medicine

## 2013-05-07 VITALS — BP 124/85 | Temp 98.1°F | Wt 158.3 lb

## 2013-05-07 DIAGNOSIS — G909 Disorder of the autonomic nervous system, unspecified: Secondary | ICD-10-CM

## 2013-05-07 DIAGNOSIS — E1049 Type 1 diabetes mellitus with other diabetic neurological complication: Secondary | ICD-10-CM

## 2013-05-07 DIAGNOSIS — Z23 Encounter for immunization: Secondary | ICD-10-CM

## 2013-05-07 DIAGNOSIS — E1042 Type 1 diabetes mellitus with diabetic polyneuropathy: Secondary | ICD-10-CM

## 2013-05-07 DIAGNOSIS — O24913 Unspecified diabetes mellitus in pregnancy, third trimester: Secondary | ICD-10-CM

## 2013-05-07 DIAGNOSIS — O34219 Maternal care for unspecified type scar from previous cesarean delivery: Secondary | ICD-10-CM

## 2013-05-07 DIAGNOSIS — O24919 Unspecified diabetes mellitus in pregnancy, unspecified trimester: Secondary | ICD-10-CM

## 2013-05-07 DIAGNOSIS — O09219 Supervision of pregnancy with history of pre-term labor, unspecified trimester: Secondary | ICD-10-CM

## 2013-05-07 LAB — CBC
MCH: 30.8 pg (ref 26.0–34.0)
MCHC: 34.8 g/dL (ref 30.0–36.0)
MCV: 88.6 fL (ref 78.0–100.0)
Platelets: 270 10*3/uL (ref 150–400)
RBC: 3.96 MIL/uL (ref 3.87–5.11)
RDW: 12.7 % (ref 11.5–15.5)

## 2013-05-07 LAB — POCT URINALYSIS DIP (DEVICE)
Glucose, UA: 500 mg/dL — AB
Hgb urine dipstick: NEGATIVE
Nitrite: NEGATIVE
Protein, ur: NEGATIVE mg/dL
Specific Gravity, Urine: >=1.03 (ref 1.005–1.030)
Urobilinogen, UA: 0.2 mg/dL (ref 0.0–1.0)
pH: 6.5 (ref 5.0–8.0)

## 2013-05-07 LAB — HEMOGLOBIN A1C
Hgb A1c MFr Bld: 8.7 % — ABNORMAL HIGH (ref <5.7)
Mean Plasma Glucose: 203 mg/dL — ABNORMAL HIGH (ref <117)

## 2013-05-07 MED ORDER — TETANUS-DIPHTH-ACELL PERTUSSIS 5-2.5-18.5 LF-MCG/0.5 IM SUSP: 0.5000 mL | Freq: Once | INTRAMUSCULAR | Status: DC

## 2013-05-07 NOTE — Patient Instructions (Addendum)
Having a circumcision done in the hospital costs approximately $510.  This will have to be paid in full prior to circumcision being performed.  There are places to have circumcision done as an outpatient which are cheaper.    Circumcisions      Provider   Phone    Price     ------------------------------------------------------------------------------   [facility]  [phone]  $510 by 4 wks     [facility]   [phone]  $317.20 by 4 wks     [facility]   [phone]  $175 by 2 wks    [facility]   [id number]  $250 by 7 days  Gestational Diabetes Mellitus Gestational diabetes mellitus, often simply referred to as gestational diabetes, is a type of diabetes that some women develop during pregnancy. In gestational diabetes, the pancreas does not make enough insulin (a hormone), the cells are less responsive to the insulin that is made (insulin resistance), or both.Normally, insulin moves sugars from food into the tissue cells. The tissue cells use the sugars for energy. The lack of insulin or the lack of normal response to insulin causes excess sugars to build up in the blood instead of going into the tissue cells. As a result, high blood sugar (hyperglycemia) develops. The effect of high sugar (glucose) levels can cause many complications.  RISK FACTORS You have an increased chance of developing gestational diabetes if you have a family history of diabetes and also have one or more of the following risk factors:  A body mass index over 30 (obesity).  A previous pregnancy with gestational diabetes.  An older age at the time of pregnancy. If blood glucose levels are kept in the normal range during pregnancy, women can have a healthy pregnancy. If your blood glucose levels are not well controlled, there may be risks to you, your unborn baby (fetus), your labor and delivery, or your newborn baby.  SYMPTOMS  If symptoms are experienced, they are much like symptoms you would normally expect during pregnancy. The  symptoms of gestational diabetes include:   Increased thirst (polydipsia).  Increased urination (polyuria).  Increased urination during the night (nocturia).  Weight loss. This weight loss may be rapid.  Frequent, recurring infections.  Tiredness (fatigue).  Weakness.  Vision changes, such as blurred vision.  Fruity smell to your breath.  Abdominal pain. DIAGNOSIS Diabetes is diagnosed when blood glucose levels are increased. Your blood glucose level may be checked by one or more of the following blood tests:  A fasting blood glucose test. You will not be allowed to eat for at least 8 hours before a blood sample is taken.  A random blood glucose test. Your blood glucose is checked at any time of the day regardless of when you ate.  A hemoglobin A1c blood glucose test. A hemoglobin A1c test provides information about blood glucose control over the previous 3 months.  An oral glucose tolerance test (OGTT). Your blood glucose is measured after you have not eaten (fasted) for 1 3 hours and then after you drink a glucose-containing beverage. Since the hormones that cause insulin resistance are highest at about 24 28 weeks of a pregnancy, an OGTT is usually performed during that time. If you have risk factors for gestational diabetes, your caregiver may test you for gestational diabetes earlier than 24 weeks of pregnancy. TREATMENT   You will need to take diabetes medicine or insulin daily to keep blood glucose levels in the desired range.  You will need to match  insulin dosing with exercise and healthy food choices. The treatment goal is to maintain the before meal (preprandial), bedtime, and overnight blood glucose level at 60 99 mg/dL during pregnancy. The treatment goal is to further maintain peak after meal blood sugar (postprandial glucose) level at 100 140 mg/dL. HOME CARE INSTRUCTIONS   Have your hemoglobin A1c level checked twice a year.  Perform daily blood glucose  monitoring as directed by your caregiver. It is common to perform frequent blood glucose monitoring.  Monitor urine ketones when you are ill and as directed by your caregiver.  Take your diabetes medicine and insulin as directed by your caregiver to maintain your blood glucose level in the desired range.  Never run out of diabetes medicine or insulin. It is needed every day.  Adjust insulin based on your intake of carbohydrates. Carbohydrates can raise blood glucose levels but need to be included in your diet. Carbohydrates provide vitamins, minerals, and fiber which are an essential part of a healthy diet. Carbohydrates are found in fruits, vegetables, whole grains, dairy products, legumes, and foods containing added sugars.    Eat healthy foods. Alternate 3 meals with 3 snacks.  Maintain a healthy weight gain. The usual total expected weight gain varies according to your prepregnancy body mass index (BMI).  Carry a medical alert card or wear your medical alert jewelry.  Carry a 15 gram carbohydrate snack with you at all times to treat low blood glucose (hypoglycemia). Some examples of 15 gram carbohydrate snacks include:  Glucose tablets, 3 or 4   Glucose gel, 15 gram tube  Raisins, 2 tablespoons (24 g)  Jelly beans, 6  Animal crackers, 8  Fruit juice, regular soda, or low fat milk, 4 ounces (120 mL)  Gummy treats, 9    Recognize hypoglycemia. Hypoglycemia during pregnancy occurs with blood glucose levels of 60 mg/dL and below. The risk for hypoglycemia increases when fasting or skipping meals, during or after intense exercise, and during sleep. Hypoglycemia symptoms can include:  Tremors or shakes.  Decreased ability to concentrate.  Sweating.  Increased heart rate.  Headache.  Dry mouth.  Hunger.  Irritability.  Anxiety.  Restless sleep.  Altered speech or coordination.  Confusion.  Treat hypoglycemia promptly. If you are alert and able to safely  swallow, follow the 15:15 rule:  Take 15 20 grams of rapid-acting glucose or carbohydrate. Rapid-acting options include glucose gel, glucose tablets, or 4 ounces (120 mL) of fruit juice, regular soda, or low fat milk.  Check your blood glucose level 15 minutes after taking the glucose.   Take 15 20 grams more of glucose if the repeat blood glucose level is still 70 mg/dL or below.  Eat a meal or snack within 1 hour once blood glucose levels return to normal.  Be alert to polyuria and polydipsia which are early signs of hyperglycemia. An early awareness of hyperglycemia allows for prompt treatment. Treat hyperglycemia as directed by your caregiver.  Engage in at least 30 minutes of physical activity a day or as directed by your caregiver. Ten minutes of physical activity timed 30 minutes after each meal is encouraged to control postprandial blood glucose levels.  Adjust your insulin dosing and food intake as needed if you start a new exercise or sport.  Follow your sick day plan at any time you are unable to eat or drink as usual.  Avoid tobacco and alcohol use.  Follow up with your caregiver regularly.  Follow the advice of your caregiver regarding  your prenatal and post-delivery (postpartum) appointments, meal planning, exercise, medicines, vitamins, blood tests, other medical tests, and physical activities.  Perform daily skin and foot care. Examine your skin and feet daily for cuts, bruises, redness, nail problems, bleeding, blisters, or sores.  Brush your teeth and gums at least twice a day and floss at least once a day. Follow up with your dentist regularly.  Schedule an eye exam during the first trimester of your pregnancy or as directed by your caregiver.  Share your diabetes management plan with your workplace or school.  Stay up-to-date with immunizations.  Learn to manage stress.  Obtain ongoing diabetes education and support as needed. SEEK MEDICAL CARE IF:   You  are unable to eat food or drink fluids for more than 6 hours.  You have nausea and vomiting for more than 6 hours.  You have a blood glucose level of 200 mg/dL and you have ketones in your urine.  There is a change in mental status.  You develop vision problems.  You have a persistent headache.  You have upper abdominal pain or discomfort.  You develop an additional serious illness.  You have diarrhea for more than 6 hours.  You have been sick or have had a fever for a couple of days and are not getting better. SEEK IMMEDIATE MEDICAL CARE IF:   You have difficulty breathing.  You no longer feel the baby moving.  You are bleeding or have discharge from your vagina.  You start having premature contractions or labor. MAKE SURE YOU:  Understand these instructions.  Will watch your condition.  Will get help right away if you are not doing well or get worse. Document Released: 08/23/2000 Document Revised: 09/11/2012 Document Reviewed: 12/14/2011 [facility] Patient Information 2014 [place], [place].  Third Trimester of Pregnancy The third trimester is from week 29 through week 42, months 7 through 9. The third trimester is a time when the fetus is growing rapidly. At the end of the ninth month, the fetus is about 20 inches in length and weighs 6 10 pounds.  BODY CHANGES Your body goes through many changes during pregnancy. The changes vary from woman to woman.   Your weight will continue to increase. You can expect to gain 25 35 pounds (11 16 kg) by the end of the pregnancy.  You may begin to get stretch marks on your hips, abdomen, and breasts.  You may urinate more often because the fetus is moving lower into your pelvis and pressing on your bladder.  You may develop or continue to have heartburn as a result of your pregnancy.  You may develop constipation because certain hormones are causing the muscles that push waste through your intestines to slow down.  You may  develop hemorrhoids or swollen, bulging veins (varicose veins).  You may have pelvic pain because of the weight gain and pregnancy hormones relaxing your joints between the bones in your pelvis. Back aches may result from over exertion of the muscles supporting your posture.  Your breasts will continue to grow and be tender. A yellow discharge may leak from your breasts called colostrum.  Your belly button may stick out.  You may feel short of breath because of your expanding uterus.  You may notice the fetus "dropping," or moving lower in your abdomen.  You may have a bloody mucus discharge. This usually occurs a few days to a week before labor begins.  Your cervix becomes thin and soft (effaced) near your due  date. WHAT TO EXPECT AT [place] will have prenatal exams every 2 weeks until week 36. Then, you will have weekly prenatal exams. During a routine prenatal visit:  You will be weighed to make sure you and the fetus are growing normally.  Your blood pressure is taken.  Your abdomen will be measured to track your baby's growth.  The fetal heartbeat will be listened to.  Any test results from the previous visit will be discussed.  You may have a cervical check near your due date to see if you have effaced. At around 36 weeks, your caregiver will check your cervix. At the same time, your caregiver will also perform a test on the secretions of the vaginal tissue. This test is to determine if a type of bacteria, Group B streptococcus, is present. Your caregiver will explain this further. Your caregiver may ask you:  What your birth plan is.  How you are feeling.  If you are feeling the baby move.  If you have had any abnormal symptoms, such as leaking fluid, bleeding, severe headaches, or abdominal cramping.  If you have any questions. Other tests or screenings that may be performed during your third trimester include:  Blood tests that check for low iron  levels (anemia).  Fetal testing to check the health, activity level, and growth of the fetus. Testing is done if you have certain medical conditions or if there are problems during the pregnancy. FALSE LABOR You may feel small, irregular contractions that eventually go away. These are called Braxton Hicks contractions, or false labor. Contractions may last for hours, days, or even weeks before true labor sets in. If contractions come at regular intervals, intensify, or become painful, it is best to be seen by your caregiver.  SIGNS OF LABOR   Menstrual-like cramps.  Contractions that are 5 minutes apart or less.  Contractions that start on the top of the uterus and spread down to the lower abdomen and back.  A sense of increased pelvic pressure or back pain.  A watery or bloody mucus discharge that comes from the vagina. If you have any of these signs before the 37th week of pregnancy, call your caregiver right away. You need to go to the hospital to get checked immediately. HOME CARE INSTRUCTIONS   Avoid all smoking, herbs, alcohol, and unprescribed drugs. These chemicals affect the formation and growth of the baby.  Follow your caregiver's instructions regarding medicine use. There are medicines that are either safe or unsafe to take during pregnancy.  Exercise only as directed by your caregiver. Experiencing uterine cramps is a good sign to stop exercising.  Continue to eat regular, healthy meals.  Wear a good support bra for breast tenderness.  Do not use hot tubs, steam rooms, or saunas.  Wear your seat belt at all times when driving.  Avoid raw meat, uncooked cheese, cat litter boxes, and soil used by cats. These carry germs that can cause birth defects in the baby.  Take your prenatal vitamins.  Try taking a stool softener (if your caregiver approves) if you develop constipation. Eat more high-fiber foods, such as fresh vegetables or fruit and whole grains. Drink plenty of  fluids to keep your urine clear or pale yellow.  Take warm sitz baths to soothe any pain or discomfort caused by hemorrhoids. Use hemorrhoid cream if your caregiver approves.  If you develop varicose veins, wear support hose. Elevate your feet for 15 minutes, 3 4 times  a day. Limit salt in your diet.  Avoid heavy lifting, wear low heal shoes, and practice good posture.  Rest a lot with your legs elevated if you have leg cramps or low back pain.  Visit your dentist if you have not gone during your pregnancy. Use a soft toothbrush to brush your teeth and be gentle when you floss.  A sexual relationship may be continued unless your caregiver directs you otherwise.  Do not travel far distances unless it is absolutely necessary and only with the approval of your caregiver.  Take prenatal classes to understand, practice, and ask questions about the labor and delivery.  Make a trial run to the hospital.  Pack your hospital bag.  Prepare the baby's nursery.  Continue to go to all your prenatal visits as directed by your caregiver. SEEK MEDICAL CARE IF:  You are unsure if you are in labor or if your water has broken.  You have dizziness.  You have mild pelvic cramps, pelvic pressure, or nagging pain in your abdominal area.  You have persistent nausea, vomiting, or diarrhea.  You have a bad smelling vaginal discharge.  You have pain with urination. SEEK IMMEDIATE MEDICAL CARE IF:   You have a fever.  You are leaking fluid from your vagina.  You have spotting or bleeding from your vagina.  You have severe abdominal cramping or pain.  You have rapid weight loss or gain.  You have shortness of breath with chest pain.  You notice sudden or extreme swelling of your face, hands, ankles, feet, or legs.  You have not felt your baby move in over an hour.  You have severe headaches that do not go away with medicine.  You have vision changes. Document Released: 05/11/2001  Document Revised: 01/17/2013 Document Reviewed: 07/18/2012 [facility] Patient Information 2014 [place].  Breastfeeding Deciding to breastfeed is one of the best choices you can make for you and your baby. A change in hormones during pregnancy causes your breast tissue to grow and increases the number and size of your milk ducts. These hormones also allow proteins, sugars, and fats from your blood supply to make breast milk in your milk-producing glands. Hormones prevent breast milk from being released before your baby is born as well as prompt milk flow after birth. Once breastfeeding has begun, thoughts of your baby, as well as his or her sucking or crying, can stimulate the release of milk from your milk-producing glands.  BENEFITS OF BREASTFEEDING For Your Baby  Your first milk (colostrum) helps your baby's digestive system function better.   There are antibodies in your milk that help your baby fight off infections.   Your baby has a lower incidence of asthma, allergies, and sudden infant death syndrome.   The nutrients in breast milk are better for your baby than infant formulas and are designed uniquely for your baby's needs.   Breast milk improves your baby's brain development.   Your baby is less likely to develop other conditions, such as childhood obesity, asthma, or type 2 diabetes mellitus.  For You   Breastfeeding helps to create a very special bond between you and your baby.   Breastfeeding is convenient. Breast milk is always available at the correct temperature and costs nothing.   Breastfeeding helps to burn calories and helps you lose the weight gained during pregnancy.   Breastfeeding makes your uterus contract to its prepregnancy size faster and slows bleeding (lochia) after you give birth.   Breastfeeding helps  to lower your risk of developing type 2 diabetes mellitus, osteoporosis, and breast or ovarian cancer later in life. SIGNS THAT YOUR BABY IS  HUNGRY Early Signs of Hunger  Increased alertness or activity.  Stretching.  Movement of the head from side to side.  Movement of the head and opening of the mouth when the corner of the mouth or cheek is stroked (rooting).  Increased sucking sounds, smacking lips, cooing, sighing, or squeaking.  Hand-to-mouth movements.  Increased sucking of fingers or hands. Late Signs of Hunger  Fussing.  Intermittent crying. Extreme Signs of Hunger Signs of extreme hunger will require calming and consoling before your baby will be able to breastfeed successfully. Do not wait for the following signs of extreme hunger to occur before you initiate breastfeeding:   Restlessness.  A loud, strong cry.   Screaming. BREASTFEEDING BASICS Breastfeeding Initiation  Find a comfortable place to sit or lie down, with your neck and back well supported.  Place a pillow or rolled up blanket under your baby to bring him or her to the level of your breast (if you are seated). Nursing pillows are specially designed to help support your arms and your baby while you breastfeed.  Make sure that your baby's abdomen is facing your abdomen.   Gently massage your breast. With your fingertips, massage from your chest wall toward your nipple in a circular motion. This encourages milk flow. You may need to continue this action during the feeding if your milk flows slowly.  Support your breast with 4 fingers underneath and your thumb above your nipple. Make sure your fingers are well away from your nipple and your baby's mouth.   Stroke your baby's lips gently with your finger or nipple.   When your baby's mouth is open wide enough, quickly bring your baby to your breast, placing your entire nipple and as much of the colored area around your nipple (areola) as possible into your baby's mouth.   More areola should be visible above your baby's upper lip than below the lower lip.   Your baby's tongue should  be between his or her lower gum and your breast.   Ensure that your baby's mouth is correctly positioned around your nipple (latched). Your baby's lips should create a seal on your breast and be turned out (everted).  It is common for your baby to suck about 2 3 minutes in order to start the flow of breast milk. Latching Teaching your baby how to latch on to your breast properly is very important. An improper latch can cause nipple pain and decreased milk supply for you and poor weight gain in your baby. Also, if your baby is not latched onto your nipple properly, he or she may swallow some air during feeding. This can make your baby fussy. Burping your baby when you switch breasts during the feeding can help to get rid of the air. However, teaching your baby to latch on properly is still the best way to prevent fussiness from swallowing air while breastfeeding. Signs that your baby has successfully latched on to your nipple:    Silent tugging or silent sucking, without causing you pain.   Swallowing heard between every 3 4 sucks.    Muscle movement above and in front of his or her ears while sucking.  Signs that your baby has not successfully latched on to nipple:   Sucking sounds or smacking sounds from your baby while breastfeeding.  Nipple pain. If you think  your baby has not latched on correctly, slip your finger into the corner of your baby's mouth to break the suction and place it between your baby's gums. Attempt breastfeeding initiation again. Signs of Successful Breastfeeding Signs from your baby:   A gradual decrease in the number of sucks or complete cessation of sucking.   Falling asleep.   Relaxation of his or her body.   Retention of a small amount of milk in his or her mouth.   Letting go of your breast by himself or herself. Signs from you:  Breasts that have increased in firmness, weight, and size 1 3 hours after feeding.   Breasts that are softer  immediately after breastfeeding.  Increased milk volume, as well as a change in milk consistency and color by the 5th day of breastfeeding.   Nipples that are not sore, cracked, or bleeding. Signs That Your [employer] is Getting Enough Milk  Wetting at least 3 diapers in a 24-hour period. The urine should be clear and pale yellow by age 38 days.  At least 3 stools in a 24-hour period by age 38 days. The stool should be soft and yellow.  At least 3 stools in a 24-hour period by age 53 days. The stool should be seedy and yellow.  No loss of weight greater than 10% of birth weight during the first 36 days of age.  Average weight gain of 4 7 ounces (120 210 mL) per week after age 61 days.  Consistent daily weight gain by age 381 days, without weight loss after the age of 2 weeks. After a feeding, your baby may spit up a small amount. This is common. BREASTFEEDING FREQUENCY AND DURATION Frequent feeding will help you make more milk and can prevent sore nipples and breast engorgement. Breastfeed when you feel the need to reduce the fullness of your breasts or when your baby shows signs of hunger. This is called "breastfeeding on demand." Avoid introducing a pacifier to your baby while you are working to establish breastfeeding (the first 4 6 weeks after your baby is born). After this time you may choose to use a pacifier. Research has shown that pacifier use during the first year of a baby's life decreases the risk of sudden infant death syndrome (SIDS). Allow your baby to feed on each breast as long as he or she wants. Breastfeed until your baby is finished feeding. When your baby unlatches or falls asleep while feeding from the first breast, offer the second breast. Because newborns are often sleepy in the first few weeks of life, you may need to awaken your baby to get him or her to feed. Breastfeeding times will vary from baby to baby. However, the following rules can serve as a guide to help you ensure that  your baby is properly fed:  Newborns (babies 30 weeks of age or younger) may breastfeed every 1 3 hours.  Newborns should not go longer than 3 hours during the day or 5 hours during the night without breastfeeding.  You should breastfeed your baby a minimum of 8 times in a 24-hour period until you begin to introduce solid foods to your baby at around 65 months of age. BREAST MILK PUMPING Pumping and storing breast milk allows you to ensure that your baby is exclusively fed your breast milk, even at times when you are unable to breastfeed. This is especially important if you are going back to work while you are still breastfeeding or when you are  not able to be present during feedings. Your lactation consultant can give you guidelines on how long it is safe to store breast milk.  A breast pump is a machine that allows you to pump milk from your breast into a sterile bottle. The pumped breast milk can then be stored in a refrigerator or freezer. Some breast pumps are operated by hand, while others use electricity. Ask your lactation consultant which type will work best for you. Breast pumps can be purchased, but some hospitals and breastfeeding support groups lease breast pumps on a monthly basis. A lactation consultant can teach you how to hand express breast milk, if you prefer not to use a pump.  CARING FOR YOUR BREASTS WHILE YOU BREASTFEED Nipples can become dry, cracked, and sore while breastfeeding. The following recommendations can help keep your breasts moisturized and healthy:  Avoid using soap on your nipples.   Wear a supportive bra. Although not required, special nursing bras and tank tops are designed to allow access to your breasts for breastfeeding without taking off your entire bra or top. Avoid wearing underwire style bras or extremely tight bras.  Air dry your nipples for 3 67minutes after each feeding.   Use only cotton bra pads to absorb leaked breast milk. Leaking of breast milk  between feedings is normal.   Use lanolin on your nipples after breastfeeding. Lanolin helps to maintain your skin's normal moisture barrier. If you use pure lanolin you do not need to wash it off before feeding your baby again. Pure lanolin is not toxic to your baby. You may also hand express a few drops of breast milk and gently massage that milk into your nipples and allow the milk to air dry. In the first few weeks after giving birth, some women experience extremely full breasts (engorgement). Engorgement can make your breasts feel heavy, warm, and tender to the touch. Engorgement peaks within 3 5 days after you give birth. The following recommendations can help ease engorgement:  Completely empty your breasts while breastfeeding or pumping. You may want to start by applying warm, moist heat (in the shower or with warm water-soaked hand towels) just before feeding or pumping. This increases circulation and helps the milk flow. If your baby does not completely empty your breasts while breastfeeding, pump any extra milk after he or she is finished.  Wear a snug bra (nursing or regular) or tank top for 1 2 days to signal your body to slightly decrease milk production.  Apply ice packs to your breasts, unless this is too uncomfortable for you.  Make sure that your baby is latched on and positioned properly while breastfeeding. If engorgement persists after 48 hours of following these recommendations, contact your health care provider or a Science writer. OVERALL HEALTH CARE RECOMMENDATIONS WHILE BREASTFEEDING  Eat healthy foods. Alternate between meals and snacks, eating 3 of each per day. Because what you eat affects your breast milk, some of the foods may make your baby more irritable than usual. Avoid eating these foods if you are sure that they are negatively affecting your baby.  Drink milk, fruit juice, and water to satisfy your thirst (about 10 glasses a day).   Rest often, relax,  and continue to take your prenatal vitamins to prevent fatigue, stress, and anemia.  Continue breast self-awareness checks.  Avoid chewing and smoking tobacco.  Avoid alcohol and drug use. Some medicines that may be harmful to your baby can pass through breast milk. It is important  to ask your health care provider before taking any medicine, including all over-the-counter and prescription medicine as well as vitamin and herbal supplements. It is possible to become pregnant while breastfeeding. If birth control is desired, ask your health care provider about options that will be safe for your baby. SEEK MEDICAL CARE IF:   You feel like you want to stop breastfeeding or have become frustrated with breastfeeding.  You have painful breasts or nipples.  Your nipples are cracked or bleeding.  Your breasts are red, tender, or warm.  You have a swollen area on either breast.  You have a fever or chills.  You have nausea or vomiting.  You have drainage other than breast milk from your nipples.  Your breasts do not become full before feedings by the 5th day after you give birth.  You feel sad and depressed.  Your baby is too sleepy to eat well.  Your baby is having trouble sleeping.   Your baby is wetting less than 3 diapers in a 24-hour period.  Your baby has less than 3 stools in a 24-hour period.  Your baby's skin or the white part of his or her eyes becomes yellow.   Your baby is not gaining weight by 37 days of age. SEEK IMMEDIATE MEDICAL CARE IF:   Your baby is overly tired (lethargic) and does not want to wake up and feed.  Your baby develops an unexplained fever. Document Released: 05/17/2005 Document Revised: 01/17/2013 Document Reviewed: 11/08/2012 [facility] Patient Information 2014 [place].

## 2013-05-07 NOTE — Progress Notes (Signed)
FBS [id number]-- non in range 2 hour pp [phone]--4 of 15 in range--most out. Pt understands risks of hyperglycemia and declines admission again.  She continues to state that her BS are improving because her HgbA1C was last at 8.  Discussed need for OB to manage BS during pregnancy, goals for pregnancy, benefits of insulin pump, risk of fetal demise, DM # 1 cause of still birth, potential long-term sequelae of poor insulin control.  Diabetes is # 1 cause of blindness, amputation and dialysis.  Pt. Continued to verbalize that "God would take care of her" and that she does not respond well, when people tell her her baby might die.  Had diabetes last pregnancy and all went well. Is seeing Endocrine who manage her insulin q 2 wks.  She reports having optho exam in last year and that it was ok.  She has not provided records.  Declines flu shot because "she might have had one at my "real" doctor's office. Will accept TDaP today. 28 wk labs Desires RCS. Continue 17P

## 2013-05-07 NOTE — Progress Notes (Signed)
P=   Requests tests for GC/Chlam because of partner's symptoms.

## 2013-05-07 NOTE — Addendum Note (Signed)
Addended by: Ernie Avena on: 05/07/2013 04:02 PM   Modules accepted: Orders

## 2013-05-08 ENCOUNTER — Other Ambulatory Visit: Payer: Self-pay | Admitting: Advanced Practice Midwife

## 2013-05-08 DIAGNOSIS — O24019 Pre-existing diabetes mellitus, type 1, in pregnancy, unspecified trimester: Secondary | ICD-10-CM

## 2013-05-08 LAB — RPR

## 2013-05-09 ENCOUNTER — Encounter: Payer: Self-pay | Admitting: Advanced Practice Midwife

## 2013-05-09 ENCOUNTER — Ambulatory Visit (HOSPITAL_COMMUNITY)
Admission: RE | Admit: 2013-05-09 | Discharge: 2013-05-09 | Disposition: A | Discharge status: Home / Self Care | Payer: Medicaid Other | Source: Ambulatory Visit | Attending: Advanced Practice Midwife | Admitting: Advanced Practice Midwife

## 2013-05-09 VITALS — BP 132/86 | HR 100 | Wt 160.0 lb

## 2013-05-09 DIAGNOSIS — O289 Unspecified abnormal findings on antenatal screening of mother: Secondary | ICD-10-CM | POA: Insufficient documentation

## 2013-05-09 DIAGNOSIS — O24019 Pre-existing diabetes mellitus, type 1, in pregnancy, unspecified trimester: Secondary | ICD-10-CM

## 2013-05-09 DIAGNOSIS — Z8751 Personal history of pre-term labor: Secondary | ICD-10-CM | POA: Insufficient documentation

## 2013-05-09 DIAGNOSIS — O09899 Supervision of other high risk pregnancies, unspecified trimester: Secondary | ICD-10-CM | POA: Insufficient documentation

## 2013-05-09 DIAGNOSIS — O2341 Unspecified infection of urinary tract in pregnancy, first trimester: Secondary | ICD-10-CM

## 2013-05-09 DIAGNOSIS — O285 Abnormal chromosomal and genetic finding on antenatal screening of mother: Secondary | ICD-10-CM | POA: Insufficient documentation

## 2013-05-09 DIAGNOSIS — E1042 Type 1 diabetes mellitus with diabetic polyneuropathy: Secondary | ICD-10-CM

## 2013-05-09 DIAGNOSIS — A749 Chlamydial infection, unspecified: Secondary | ICD-10-CM

## 2013-05-09 DIAGNOSIS — O24919 Unspecified diabetes mellitus in pregnancy, unspecified trimester: Secondary | ICD-10-CM | POA: Insufficient documentation

## 2013-05-09 DIAGNOSIS — O34219 Maternal care for unspecified type scar from previous cesarean delivery: Secondary | ICD-10-CM | POA: Insufficient documentation

## 2013-05-09 LAB — CULTURE, OB URINE

## 2013-05-09 NOTE — ED Notes (Signed)
Patient states that she is having fundal pressure and pain. This is relieved with pressure and moving uterus.

## 2013-05-14 ENCOUNTER — Ambulatory Visit (INDEPENDENT_AMBULATORY_CARE_PROVIDER_SITE_OTHER): Payer: Medicaid Other | Admitting: Advanced Practice Midwife

## 2013-05-14 VITALS — BP 118/81 | Temp 98.6°F | Wt 159.4 lb

## 2013-05-14 DIAGNOSIS — O98319 Other infections with a predominantly sexual mode of transmission complicating pregnancy, unspecified trimester: Secondary | ICD-10-CM

## 2013-05-14 DIAGNOSIS — O09219 Supervision of pregnancy with history of pre-term labor, unspecified trimester: Secondary | ICD-10-CM

## 2013-05-14 DIAGNOSIS — O09892 Supervision of other high risk pregnancies, second trimester: Secondary | ICD-10-CM

## 2013-05-14 DIAGNOSIS — A749 Chlamydial infection, unspecified: Secondary | ICD-10-CM

## 2013-05-14 DIAGNOSIS — A568 Sexually transmitted chlamydial infection of other sites: Secondary | ICD-10-CM

## 2013-05-14 DIAGNOSIS — Z8619 Personal history of other infectious and parasitic diseases: Secondary | ICD-10-CM

## 2013-05-14 LAB — POCT URINALYSIS DIP (DEVICE)
Bilirubin Urine: NEGATIVE
Glucose, UA: 500 mg/dL — AB
Ketones, ur: NEGATIVE mg/dL
Leukocytes, UA: NEGATIVE
Nitrite: NEGATIVE
Protein, ur: 30 mg/dL — AB
Urobilinogen, UA: 0.2 mg/dL (ref 0.0–1.0)

## 2013-05-14 NOTE — Progress Notes (Signed)
p=103

## 2013-05-14 NOTE — Progress Notes (Signed)
Does not have CBG print-out from endocrinologist because she missed endocrynology appt. Has no idea what CBGs have been. Encouraged to keep appts and write down CBGs. Refuses. Asking what Chlamydia testing result was from last visit. Neg 11/3. States she thought it was retested last week. Will send GC/CT urine today. Practice safe sex.

## 2013-05-14 NOTE — Patient Instructions (Signed)
Third Trimester of Pregnancy °The third trimester is from week 29 through week 42, months 7 through 9. The third trimester is a time when the fetus is growing rapidly. At the end of the ninth month, the fetus is about 20 inches in length and weighs 6 10 pounds.  °BODY CHANGES °Your body goes through many changes during pregnancy. The changes vary from woman to woman.  °· Your weight will continue to increase. You can expect to gain 25 35 pounds (11 16 kg) by the end of the pregnancy. °· You may begin to get stretch marks on your hips, abdomen, and breasts. °· You may urinate more often because the fetus is moving lower into your pelvis and pressing on your bladder. °· You may develop or continue to have heartburn as a result of your pregnancy. °· You may develop constipation because certain hormones are causing the muscles that push waste through your intestines to slow down. °· You may develop hemorrhoids or swollen, bulging veins (varicose veins). °· You may have pelvic pain because of the weight gain and pregnancy hormones relaxing your joints between the bones in your pelvis. Back aches may result from over exertion of the muscles supporting your posture. °· Your breasts will continue to grow and be tender. A yellow discharge may leak from your breasts called colostrum. °· Your belly button may stick out. °· You may feel short of breath because of your expanding uterus. °· You may notice the fetus "dropping," or moving lower in your abdomen. °· You may have a bloody mucus discharge. This usually occurs a few days to a week before labor begins. °· Your cervix becomes thin and soft (effaced) near your due date. °WHAT TO EXPECT AT YOUR PRENATAL EXAMS  °You will have prenatal exams every 2 weeks until week 36. Then, you will have weekly prenatal exams. During a routine prenatal visit: °· You will be weighed to make sure you and the fetus are growing normally. °· Your blood pressure is taken. °· Your abdomen will be  measured to track your baby's growth. °· The fetal heartbeat will be listened to. °· Any test results from the previous visit will be discussed. °· You may have a cervical check near your due date to see if you have effaced. °At around 36 weeks, your caregiver will check your cervix. At the same time, your caregiver will also perform a test on the secretions of the vaginal tissue. This test is to determine if a type of bacteria, Group B streptococcus, is present. Your caregiver will explain this further. °Your caregiver may ask you: °· What your birth plan is. °· How you are feeling. °· If you are feeling the baby move. °· If you have had any abnormal symptoms, such as leaking fluid, bleeding, severe headaches, or abdominal cramping. °· If you have any questions. °Other tests or screenings that may be performed during your third trimester include: °· Blood tests that check for low iron levels (anemia). °· Fetal testing to check the health, activity level, and growth of the fetus. Testing is done if you have certain medical conditions or if there are problems during the pregnancy. °FALSE LABOR °You may feel small, irregular contractions that eventually go away. These are called Braxton Hicks contractions, or false labor. Contractions may last for hours, days, or even weeks before true labor sets in. If contractions come at regular intervals, intensify, or become painful, it is best to be seen by your caregiver.  °  SIGNS OF LABOR  °· Menstrual-like cramps. °· Contractions that are 5 minutes apart or less. °· Contractions that start on the top of the uterus and spread down to the lower abdomen and back. °· A sense of increased pelvic pressure or back pain. °· A watery or bloody mucus discharge that comes from the vagina. °If you have any of these signs before the 37th week of pregnancy, call your caregiver right away. You need to go to the hospital to get checked immediately. °HOME CARE INSTRUCTIONS  °· Avoid all  smoking, herbs, alcohol, and unprescribed drugs. These chemicals affect the formation and growth of the baby. °· Follow your caregiver's instructions regarding medicine use. There are medicines that are either safe or unsafe to take during pregnancy. °· Exercise only as directed by your caregiver. Experiencing uterine cramps is a good sign to stop exercising. °· Continue to eat regular, healthy meals. °· Wear a good support bra for breast tenderness. °· Do not use hot tubs, steam rooms, or saunas. °· Wear your seat belt at all times when driving. °· Avoid raw meat, uncooked cheese, cat litter boxes, and soil used by cats. These carry germs that can cause birth defects in the baby. °· Take your prenatal vitamins. °· Try taking a stool softener (if your caregiver approves) if you develop constipation. Eat more high-fiber foods, such as fresh vegetables or fruit and whole grains. Drink plenty of fluids to keep your urine clear or pale yellow. °· Take warm sitz baths to soothe any pain or discomfort caused by hemorrhoids. Use hemorrhoid cream if your caregiver approves. °· If you develop varicose veins, wear support hose. Elevate your feet for 15 minutes, 3 4 times a day. Limit salt in your diet. °· Avoid heavy lifting, wear low heal shoes, and practice good posture. °· Rest a lot with your legs elevated if you have leg cramps or low back pain. °· Visit your dentist if you have not gone during your pregnancy. Use a soft toothbrush to brush your teeth and be gentle when you floss. °· A sexual relationship may be continued unless your caregiver directs you otherwise. °· Do not travel far distances unless it is absolutely necessary and only with the approval of your caregiver. °· Take prenatal classes to understand, practice, and ask questions about the labor and delivery. °· Make a trial run to the hospital. °· Pack your hospital bag. °· Prepare the baby's nursery. °· Continue to go to all your prenatal visits as directed  by your caregiver. °SEEK MEDICAL CARE IF: °· You are unsure if you are in labor or if your water has broken. °· You have dizziness. °· You have mild pelvic cramps, pelvic pressure, or nagging pain in your abdominal area. °· You have persistent nausea, vomiting, or diarrhea. °· You have a bad smelling vaginal discharge. °· You have pain with urination. °SEEK IMMEDIATE MEDICAL CARE IF:  °· You have a fever. °· You are leaking fluid from your vagina. °· You have spotting or bleeding from your vagina. °· You have severe abdominal cramping or pain. °· You have rapid weight loss or gain. °· You have shortness of breath with chest pain. °· You notice sudden or extreme swelling of your face, hands, ankles, feet, or legs. °· You have not felt your baby move in over an hour. °· You have severe headaches that do not go away with medicine. °· You have vision changes. °Document Released: 05/11/2001 Document Revised: 01/17/2013 Document Reviewed:   07/18/2012 °[employer]® Patient Information ©2014 [employer]. °Fetal Movement Counts °Patient Name: __________________________________________________ Patient Due Date: ____________________ °Performing a fetal movement count is highly recommended in high-risk pregnancies, but it is good for every pregnant woman to do. Your caregiver may ask you to start counting fetal movements at 28 weeks of the pregnancy. Fetal movements often increase: °· After eating a full meal. °· After physical activity. °· After eating or drinking something sweet or cold. °· At rest. °Pay attention to when you feel the baby is most active. This will help you notice a pattern of your baby's sleep and wake cycles and what factors contribute to an increase in fetal movement. It is important to perform a fetal movement count at the same time each day when your baby is normally most active.  °HOW TO COUNT FETAL MOVEMENTS °1. Find a quiet and comfortable area to sit or lie down on your left side. Lying on your left  side provides the best blood and oxygen circulation to your baby. °2. Write down the day and time on a sheet of paper or in a journal. °3. Start counting kicks, flutters, swishes, rolls, or jabs in a 2 hour period. You should feel at least 10 movements within 2 hours. °4. If you do not feel 10 movements in 2 hours, wait 2 3 hours and count again. Look for a change in the pattern or not enough counts in 2 hours. °SEEK MEDICAL CARE IF: °· You feel less than 10 counts in 2 hours, tried twice. °· There is no movement in over an hour. °· The pattern is changing or taking longer each day to reach 10 counts in 2 hours. °· You feel the baby is not moving as he or she usually does. °Date: ____________ Movements: ____________ Start time: ____________ Finish time: ____________  °Date: ____________ Movements: ____________ Start time: ____________ Finish time: ____________ °Date: ____________ Movements: ____________ Start time: ____________ Finish time: ____________ °Date: ____________ Movements: ____________ Start time: ____________ Finish time: ____________ °Date: ____________ Movements: ____________ Start time: ____________ Finish time: ____________ °Date: ____________ Movements: ____________ Start time: ____________ Finish time: ____________ °Date: ____________ Movements: ____________ Start time: ____________ Finish time: ____________ °Date: ____________ Movements: ____________ Start time: ____________ Finish time: ____________  °Date: ____________ Movements: ____________ Start time: ____________ Finish time: ____________ °Date: ____________ Movements: ____________ Start time: ____________ Finish time: ____________ °Date: ____________ Movements: ____________ Start time: ____________ Finish time: ____________ °Date: ____________ Movements: ____________ Start time: ____________ Finish time: ____________ °Date: ____________ Movements: ____________ Start time: ____________ Finish time: ____________ °Date: ____________ Movements:  ____________ Start time: ____________ Finish time: ____________ °Date: ____________ Movements: ____________ Start time: ____________ Finish time: ____________  °Date: ____________ Movements: ____________ Start time: ____________ Finish time: ____________ °Date: ____________ Movements: ____________ Start time: ____________ Finish time: ____________ °Date: ____________ Movements: ____________ Start time: ____________ Finish time: ____________ °Date: ____________ Movements: ____________ Start time: ____________ Finish time: ____________ °Date: ____________ Movements: ____________ Start time: ____________ Finish time: ____________ °Date: ____________ Movements: ____________ Start time: ____________ Finish time: ____________ °Date: ____________ Movements: ____________ Start time: ____________ Finish time: ____________  °Date: ____________ Movements: ____________ Start time: ____________ Finish time: ____________ °Date: ____________ Movements: ____________ Start time: ____________ Finish time: ____________ °Date: ____________ Movements: ____________ Start time: ____________ Finish time: ____________ °Date: ____________ Movements: ____________ Start time: ____________ Finish time: ____________ °Date: ____________ Movements: ____________ Start time: ____________ Finish time: ____________ °Date: ____________ Movements: ____________ Start time: ____________ Finish time: ____________ °Date: ____________ Movements: ____________ Start time: ____________ Finish time: ____________  °Date:   ____________ Movements: ____________ Start time: ____________ Finish time: ____________ °Date: ____________ Movements: ____________ Start time: ____________ Finish time: ____________ °Date: ____________ Movements: ____________ Start time: ____________ Finish time: ____________ °Date: ____________ Movements: ____________ Start time: ____________ Finish time: ____________ °Date: ____________ Movements: ____________ Start time: ____________ Finish  time: ____________ °Date: ____________ Movements: ____________ Start time: ____________ Finish time: ____________ °Date: ____________ Movements: ____________ Start time: ____________ Finish time: ____________  °Date: ____________ Movements: ____________ Start time: ____________ Finish time: ____________ °Date: ____________ Movements: ____________ Start time: ____________ Finish time: ____________ °Date: ____________ Movements: ____________ Start time: ____________ Finish time: ____________ °Date: ____________ Movements: ____________ Start time: ____________ Finish time: ____________ °Date: ____________ Movements: ____________ Start time: ____________ Finish time: ____________ °Date: ____________ Movements: ____________ Start time: ____________ Finish time: ____________ °Date: ____________ Movements: ____________ Start time: ____________ Finish time: ____________  °Date: ____________ Movements: ____________ Start time: ____________ Finish time: ____________ °Date: ____________ Movements: ____________ Start time: ____________ Finish time: ____________ °Date: ____________ Movements: ____________ Start time: ____________ Finish time: ____________ °Date: ____________ Movements: ____________ Start time: ____________ Finish time: ____________ °Date: ____________ Movements: ____________ Start time: ____________ Finish time: ____________ °Date: ____________ Movements: ____________ Start time: ____________ Finish time: ____________ °Date: ____________ Movements: ____________ Start time: ____________ Finish time: ____________  °Date: ____________ Movements: ____________ Start time: ____________ Finish time: ____________ °Date: ____________ Movements: ____________ Start time: ____________ Finish time: ____________ °Date: ____________ Movements: ____________ Start time: ____________ Finish time: ____________ °Date: ____________ Movements: ____________ Start time: ____________ Finish time: ____________ °Date: ____________  Movements: ____________ Start time: ____________ Finish time: ____________ °Date: ____________ Movements: ____________ Start time: ____________ Finish time: ____________ °Document Released: 06/16/2006 Document Revised: 05/03/2012 Document Reviewed: 03/13/2012 °[employer]® Patient Information ©2014 [employer]. ° °

## 2013-05-15 LAB — GC/CHLAMYDIA PROBE AMP
CT Probe RNA: POSITIVE — AB
GC Probe RNA: NEGATIVE

## 2013-05-16 ENCOUNTER — Telehealth: Payer: Self-pay | Admitting: *Deleted

## 2013-05-16 NOTE — Telephone Encounter (Addendum)
Message copied by Langston Reusing on Wed May 16, 2013  8:28 AM ------      Message from: [place], [place]      Created: Wed May 16, 2013  2:32 AM       CT pos. Please notify pt. Tx w/ Azithromycin and report per protocol. Partner needs Tx.   ------  Called pt and left message to call back and indicate whether we may leave detailed information on her voice mail.

## 2013-05-16 NOTE — Telephone Encounter (Signed)
Pt returned call and stated that we can leave detailed information on her VM.  I left message indicating that she has treatment for positive chlamydia at her [place] off [address] to please pick and take as instructed and to make sure that she has her partner or partners treated as well. STD card faxed

## 2013-05-18 ENCOUNTER — Encounter (HOSPITAL_COMMUNITY): Payer: Self-pay | Admitting: *Deleted

## 2013-05-19 ENCOUNTER — Inpatient Hospital Stay (HOSPITAL_COMMUNITY)
Admission: AD | Admit: 2013-05-19 | Discharge: 2013-05-21 | DRG: 781 | Disposition: A | Discharge status: Home / Self Care | Payer: Medicaid Other | Source: Ambulatory Visit | Attending: Family Medicine | Admitting: Family Medicine

## 2013-05-19 ENCOUNTER — Inpatient Hospital Stay (HOSPITAL_COMMUNITY): Payer: Medicaid Other

## 2013-05-19 ENCOUNTER — Encounter (HOSPITAL_COMMUNITY): Payer: Self-pay | Admitting: Family

## 2013-05-19 DIAGNOSIS — J189 Pneumonia, unspecified organism: Secondary | ICD-10-CM | POA: Diagnosis present

## 2013-05-19 DIAGNOSIS — O285 Abnormal chromosomal and genetic finding on antenatal screening of mother: Secondary | ICD-10-CM

## 2013-05-19 DIAGNOSIS — E1042 Type 1 diabetes mellitus with diabetic polyneuropathy: Secondary | ICD-10-CM

## 2013-05-19 DIAGNOSIS — O34219 Maternal care for unspecified type scar from previous cesarean delivery: Secondary | ICD-10-CM | POA: Diagnosis present

## 2013-05-19 DIAGNOSIS — E109 Type 1 diabetes mellitus without complications: Secondary | ICD-10-CM | POA: Diagnosis present

## 2013-05-19 DIAGNOSIS — J1100 Influenza due to unidentified influenza virus with unspecified type of pneumonia: Secondary | ICD-10-CM

## 2013-05-19 DIAGNOSIS — A749 Chlamydial infection, unspecified: Secondary | ICD-10-CM

## 2013-05-19 DIAGNOSIS — O368390 Maternal care for abnormalities of the fetal heart rate or rhythm, unspecified trimester, not applicable or unspecified: Secondary | ICD-10-CM | POA: Diagnosis present

## 2013-05-19 DIAGNOSIS — Z794 Long term (current) use of insulin: Secondary | ICD-10-CM

## 2013-05-19 DIAGNOSIS — O2341 Unspecified infection of urinary tract in pregnancy, first trimester: Secondary | ICD-10-CM

## 2013-05-19 DIAGNOSIS — O99891 Other specified diseases and conditions complicating pregnancy: Secondary | ICD-10-CM | POA: Diagnosis present

## 2013-05-19 DIAGNOSIS — O212 Late vomiting of pregnancy: Secondary | ICD-10-CM | POA: Diagnosis present

## 2013-05-19 DIAGNOSIS — O24919 Unspecified diabetes mellitus in pregnancy, unspecified trimester: Secondary | ICD-10-CM

## 2013-05-19 LAB — URINALYSIS, ROUTINE W REFLEX MICROSCOPIC
Bilirubin Urine: NEGATIVE
Ketones, ur: NEGATIVE mg/dL
Leukocytes, UA: NEGATIVE
Nitrite: NEGATIVE
Protein, ur: 30 mg/dL — AB
Specific Gravity, Urine: 1.02 (ref 1.005–1.030)
Urobilinogen, UA: 1 mg/dL (ref 0.0–1.0)
pH: 8.5 — ABNORMAL HIGH (ref 5.0–8.0)

## 2013-05-19 LAB — INFLUENZA PANEL BY PCR (TYPE A & B)
H1N1 flu by pcr: NOT DETECTED
Influenza A By PCR: POSITIVE — AB

## 2013-05-19 LAB — CBC
HCT: 33.4 % — ABNORMAL LOW (ref 36.0–46.0)
MCHC: 34.4 g/dL (ref 30.0–36.0)
MCV: 90 fL (ref 78.0–100.0)
RDW: 12 % (ref 11.5–15.5)
WBC: 6.9 10*3/uL (ref 4.0–10.5)

## 2013-05-19 LAB — TYPE AND SCREEN
ABO/RH(D): O POS
Antibody Screen: NEGATIVE

## 2013-05-19 LAB — GLUCOSE, CAPILLARY
Glucose-Capillary: 160 mg/dL — ABNORMAL HIGH (ref 70–99)
Glucose-Capillary: 98 mg/dL (ref 70–99)

## 2013-05-19 LAB — URINE MICROSCOPIC-ADD ON

## 2013-05-19 MED ORDER — INSULIN ASPART 100 UNIT/ML ~~LOC~~ SOLN
1.0000 [IU] | Freq: Three times a day (TID) | SUBCUTANEOUS | Status: DC
  Administered 2013-05-19: 12 [IU] via SUBCUTANEOUS
  Administered 2013-05-20: 13 [IU] via SUBCUTANEOUS
  Administered 2013-05-20: 12 [IU] via SUBCUTANEOUS
  Administered 2013-05-20: 4 [IU] via SUBCUTANEOUS
  Administered 2013-05-21: 14 [IU] via SUBCUTANEOUS
  Administered 2013-05-21: 7 [IU] via SUBCUTANEOUS

## 2013-05-19 MED ORDER — DOCUSATE SODIUM 100 MG PO CAPS
100.0000 mg | ORAL_CAPSULE | Freq: Every day | ORAL | Status: DC
  Administered 2013-05-20 – 2013-05-21 (×2): 100 mg via ORAL
  Filled 2013-05-19 (×4): qty 1

## 2013-05-19 MED ORDER — INSULIN NPH (HUMAN) (ISOPHANE) 100 UNIT/ML ~~LOC~~ SUSP
10.0000 [IU] | Freq: Two times a day (BID) | SUBCUTANEOUS | Status: DC
  Administered 2013-05-19: 10 [IU] via SUBCUTANEOUS
  Filled 2013-05-19: qty 10

## 2013-05-19 MED ORDER — SALINE SPRAY 0.65 % NA SOLN
1.0000 | NASAL | Status: DC | PRN
  Administered 2013-05-19: 1 via NASAL
  Filled 2013-05-19: qty 44

## 2013-05-19 MED ORDER — HYDROXYPROGESTERONE CAPROATE 250 MG/ML IM OIL
250.0000 mg | TOPICAL_OIL | INTRAMUSCULAR | Status: DC
  Administered 2013-05-21: 250 mg via INTRAMUSCULAR
  Filled 2013-05-19: qty 1

## 2013-05-19 MED ORDER — OSELTAMIVIR PHOSPHATE 75 MG PO CAPS
75.0000 mg | ORAL_CAPSULE | Freq: Two times a day (BID) | ORAL | Status: DC
  Administered 2013-05-19 – 2013-05-21 (×4): 75 mg via ORAL
  Filled 2013-05-19 (×6): qty 1

## 2013-05-19 MED ORDER — DEXTROSE 5 % IV SOLN
1.0000 g | Freq: Two times a day (BID) | INTRAVENOUS | Status: DC
  Administered 2013-05-19 – 2013-05-21 (×4): 1 g via INTRAVENOUS
  Filled 2013-05-19 (×6): qty 10

## 2013-05-19 MED ORDER — GUAIFENESIN 100 MG/5ML PO SOLN
200.0000 mg | Freq: Once | ORAL | Status: AC
  Administered 2013-05-19: 200 mg via ORAL
  Filled 2013-05-19: qty 10

## 2013-05-19 MED ORDER — ACETAMINOPHEN 500 MG PO TABS
1000.0000 mg | ORAL_TABLET | Freq: Once | ORAL | Status: AC
  Administered 2013-05-19: 1000 mg via ORAL
  Filled 2013-05-19: qty 2

## 2013-05-19 MED ORDER — ACETAMINOPHEN 325 MG PO TABS
650.0000 mg | ORAL_TABLET | ORAL | Status: DC | PRN
  Administered 2013-05-19: 650 mg via ORAL
  Filled 2013-05-19: qty 2

## 2013-05-19 MED ORDER — DEXTROSE 5 % IV SOLN
500.0000 mg | INTRAVENOUS | Status: DC
  Administered 2013-05-19 – 2013-05-20 (×2): 500 mg via INTRAVENOUS
  Filled 2013-05-19 (×3): qty 500

## 2013-05-19 MED ORDER — PRENATAL MULTIVITAMIN CH
1.0000 | ORAL_TABLET | Freq: Every day | ORAL | Status: DC
  Filled 2013-05-19 (×4): qty 1

## 2013-05-19 MED ORDER — INSULIN ASPART 100 UNIT/ML ~~LOC~~ SOLN
2.0000 [IU] | Freq: Three times a day (TID) | SUBCUTANEOUS | Status: DC
  Administered 2013-05-19 – 2013-05-21 (×2): 2 [IU] via SUBCUTANEOUS

## 2013-05-19 MED ORDER — ZOLPIDEM TARTRATE 5 MG PO TABS: 5.0000 mg | ORAL_TABLET | Freq: Every evening | ORAL | Status: DC | PRN

## 2013-05-19 MED ORDER — OXYCODONE-ACETAMINOPHEN 5-325 MG PO TABS
1.0000 | ORAL_TABLET | ORAL | Status: DC | PRN
  Administered 2013-05-19 – 2013-05-20 (×4): 1 via ORAL
  Filled 2013-05-19 (×4): qty 1

## 2013-05-19 MED ORDER — LACTATED RINGERS IV SOLN
INTRAVENOUS | Status: DC
  Administered 2013-05-19: 15:00:00 via INTRAVENOUS

## 2013-05-19 MED ORDER — SODIUM CHLORIDE 0.9 % IV SOLN
INTRAVENOUS | Status: DC
  Administered 2013-05-19 – 2013-05-21 (×5): via INTRAVENOUS

## 2013-05-19 MED ORDER — ONDANSETRON HCL 4 MG/2ML IJ SOLN
4.0000 mg | Freq: Once | INTRAMUSCULAR | Status: AC
  Administered 2013-05-19: 4 mg via INTRAVENOUS
  Filled 2013-05-19: qty 2

## 2013-05-19 MED ORDER — CALCIUM CARBONATE ANTACID 500 MG PO CHEW
2.0000 | CHEWABLE_TABLET | ORAL | Status: DC | PRN
  Filled 2013-05-19: qty 2

## 2013-05-19 MED ORDER — LACTATED RINGERS IV BOLUS (SEPSIS)
1000.0000 mL | Freq: Once | INTRAVENOUS | Status: AC
  Administered 2013-05-19: 1000 mL via INTRAVENOUS

## 2013-05-19 NOTE — [facility] Note (Signed)
22 yo, G3P1 at [redacted]w[redacted]d, presents to [facility] via EMS for productive cough since Monday, reports HA, nasal congestion, chills and body aches for 2-3 days; no flu shot.  Patient is T1DM - waiting on insulin pump (endocronologist Peri Jefferson, MD at [facility]). Last insulin today at 0700.  Repoorts +fm. Denies VB, contractions.

## 2013-05-19 NOTE — MAU Provider Note (Signed)
History     CSN: [id number]  Arrival date and time: 05/19/13 1042   None     Chief Complaint  Patient presents with  . Cough  . Nasal Congestion  . Generalized Body Aches   HPI  Ann Robertson is a 22 y.o. female [phone] at [redacted]w[redacted]d who presents via EMS with cough, headache, nasal congestion, generalized body aches, and hemoptysis. She has been coughing since Monday, symptoms became worse last night. Patient has been vomiting several times in the last 24 hour hours; at times when she coughs hard she notices blood in her sputum. Pt has a history of a preterm delivery at 34 weeks and juvenile diabetes.   OB History   Grav Para Term Preterm Abortions TAB SAB Ect Mult Living   3 1 0 1 1 0 1 0 0 1       Past Medical History  Diagnosis Date  . Diabetes mellitus   . Juvenile diabetes mellitus   . Juvenile diabetes 2000  . Neuropathy   . High cholesterol   . HSV-2 infection   . STD (sexually transmitted disease)     Past Surgical History  Procedure Laterality Date  . Cesarean section      Family History  Problem Relation Age of Onset  . Stroke Mother   . Hypertension Mother   . Heart disease Mother   . Kidney disease Mother   . Hyperlipidemia Father   . Diabetes Father     History  Substance Use Topics  . Smoking status: Never Smoker   . Smokeless tobacco: Not on file  . Alcohol Use: No    Allergies:  Allergies  Allergen Reactions  . Hydromorphone Hcl Rash    Facility-administered medications prior to admission  Medication Dose Route Frequency Provider Last Rate Last Dose  . hydroxyprogesterone caproate (DELALUTIN) 250 mg/mL injection 250 mg  250 mg Intramuscular Weekly Peggy Constant, MD   250 mg at 05/14/13 1157   Prescriptions prior to admission  Medication Sig Dispense Refill  . acetaminophen (TYLENOL) 500 MG tablet Take 1,000 mg by mouth.      . hydroxyprogesterone caproate (DELALUTIN) 250 mg/mL OIL injection Inject 250 mg into the muscle once a  week. First dose was 02/26/13. For 19 doses.      Marland Kitchen insulin lispro (HUMALOG) 100 UNIT/ML injection Inject 2-14 Units into the skin 4 (four) times daily. Correction scale: fasting and 2 hrs after first bite of each meal: Fasting @ 8am and 2 hrs after the start of each meal 121-160--2 units 161-200--4 units 201-240--6 units 241-280--8 units 281-320--10 units 321-360--12 units 361-420--14 units (call MD if symptomatic-headache, frequent urination, excessive thirst      . insulin NPH (HUMULIN N,NOVOLIN N) 100 UNIT/ML injection Inject 10 Units into the skin 2 (two) times daily at 8 am and 10 pm.       . Prenatal Vit-Fe Fumarate-FA (PRENATAL MULTIVITAMIN) TABS tablet Take 1 tablet by mouth daily at 12 noon.       Results for orders placed during the hospital encounter of 05/19/13 (from the past 24 hour(s))  CBC     Status: Abnormal   Collection Time    05/19/13 11:19 AM      Result Value Range   WBC 6.9  4.0 - 10.5 K/uL   RBC 3.71 (*) 3.87 - 5.11 MIL/uL   Hemoglobin 11.5 (*) 12.0 - 15.0 g/dL   HCT 33.4 (*) 36.0 - 46.0 %   MCV 90.0  78.0 -  100.0 fL   MCH 31.0  26.0 - 34.0 pg   MCHC 34.4  30.0 - 36.0 g/dL   RDW 12.0  11.5 - 15.5 %   Platelets 252  150 - 400 K/uL  URINALYSIS, ROUTINE W REFLEX MICROSCOPIC     Status: Abnormal   Collection Time    05/19/13 12:08 PM      Result Value Range   Color, Urine YELLOW  YELLOW   APPearance CLEAR  CLEAR   Specific Gravity, Urine 1.020  1.005 - 1.030   pH 8.5 (*) 5.0 - 8.0   Glucose, UA NEGATIVE  NEGATIVE mg/dL   Hgb urine dipstick NEGATIVE  NEGATIVE   Bilirubin Urine NEGATIVE  NEGATIVE   Ketones, ur NEGATIVE  NEGATIVE mg/dL   Protein, ur 30 (*) NEGATIVE mg/dL   Urobilinogen, UA 1.0  0.0 - 1.0 mg/dL   Nitrite NEGATIVE  NEGATIVE   Leukocytes, UA NEGATIVE  NEGATIVE  URINE MICROSCOPIC-ADD ON     Status: Abnormal   Collection Time    05/19/13 12:08 PM      Result Value Range   Squamous Epithelial / LPF FEW (*) RARE   RBC / HPF 0-2  <3 RBC/hpf    Bacteria, UA RARE  RARE   Urine-Other MUCOUS PRESENT    GLUCOSE, CAPILLARY     Status: Abnormal   Collection Time    05/19/13 12:34 PM      Result Value Range   Glucose-Capillary 113 (*) 70 - 99 mg/dL   Dg Chest 2 View  05/19/2013   CLINICAL DATA:  Cough and chest congestion.  EXAM: CHEST  2 VIEW  COMPARISON:  None.  FINDINGS: There are coarse lung markings in the right lower lobe posteriorly. A few coarse lung markings in the left lower lobe posteriorly are suspected as well. The cardio pericardial silhouette is normal in size. The pulmonary vascularity is not engorged. The mediastinum is normal in width. The observed portions of the bony thorax appear normal.  IMPRESSION: There is atelectasis versus early pneumonia in the right lower lobe. There may be similar involvement of the left lower lobe. There is no pleural effusion or evidence of CHF.   Electronically Signed   By: David  Martinique   On: 05/19/2013 13:28    Review of Systems  Constitutional: Positive for fever, chills, malaise/fatigue and diaphoresis.  HENT: Positive for congestion and sore throat.   Respiratory: Positive for cough, hemoptysis and sputum production. Negative for shortness of breath and wheezing.   Cardiovascular: Negative for chest pain.  Gastrointestinal: Positive for nausea and vomiting. Negative for abdominal pain, diarrhea and constipation.  Genitourinary: Negative for dysuria, urgency, frequency and hematuria.  Musculoskeletal: Positive for back pain.  Neurological: Positive for headaches.   Physical Exam   Blood pressure 134/68, pulse 120, temperature 99.4 F (37.4 C), temperature source Oral, resp. rate 18, SpO2 98.00%.  Physical Exam  Constitutional: She is oriented to person, place, and time. She is cooperative.  Non-toxic appearance. She has a sickly appearance. She appears ill. No distress.  HENT:  Head: Normocephalic.  Mouth/Throat: Mucous membranes are dry.  Eyes: Lids are normal.   Cardiovascular: Tachycardia present.   Respiratory: Effort normal. No respiratory distress. She has decreased breath sounds in the right lower field and the left lower field. She has rhonchi in the right upper field, the right lower field, the left upper field and the left lower field.  GI: Soft.  Neurological: She is alert and oriented to person,  place, and time.    Fetal Tracing: Baseline: 145 bpm Variability: Moderate  Accelerations: 10x10 Decelerations: variables Toco: UI  MAU Course  Procedures None  MDM Chest xray CBC CBG LR bolus Zofran Robitussin  Tylenol  Consulted with Dr.Pratt; plan to admit patient for pneumonia.   Assessment and Plan   Pneumonia in pregnancy  Fever Influenza swab pending results  Admit to Antenatal per Dr. Jesse Fall, NP 05/19/2013, 2:19 PM

## 2013-05-19 NOTE — Progress Notes (Signed)
Pt. Refused fetal monitoring at this time because her stomach hurts

## 2013-05-19 NOTE — H&P (Signed)
Chief Complaint   Patient presents with   .  Cough   .  Nasal Congestion   .  Generalized Body Aches    HPI [name] is a 22 y.o. female [phone] at [redacted]w[redacted]d who presents via EMS with cough, headache, nasal congestion, generalized body aches, and hemoptysis. She has been coughing since Monday, symptoms became worse last night. Patient has been vomiting several times in the last 24 hour hours; at times when she coughs hard she notices blood in her sputum. Pt has a history of a preterm delivery at 34 weeks is on 17 P, followed in [facility] and juvenile diabetes, class C. The patient has been receiving 17P, but is non-complaint with DM.  BS range between 30-300.  She has been unwilling to try insulin pump in the past, but states she is now ready to pursue. Has sick contacts.  Has positive influenza A on test.  Declined flu shot previously.    OB History     Grav  Para  Term  Preterm  Abortions  TAB  SAB  Ect  Mult  Living         3      1   0   1     1          0       1       0     0        1      Past Medical History   Diagnosis  Date   .  Diabetes mellitus     .  Juvenile diabetes mellitus     .  Juvenile diabetes  2000   .  Neuropathy     .  High cholesterol     .  HSV-2 infection     .  STD (sexually transmitted disease)       Past Surgical History   Procedure  Laterality  Date   .  Cesarean section         Family History   Problem  Relation  Age of Onset   .  Stroke  Mother     .  Hypertension  Mother     .  Heart disease  Mother     .  Kidney disease  Mother     .  Hyperlipidemia  Father     .  Diabetes  Father       History   Substance Use Topics   .  Smoking status:  Never Smoker    .  Smokeless tobacco:  Not on file   .  Alcohol Use:  No    Allergies:    Allergen     Reactions   .  Hydromorphone Hcl   Rash    Medications:   Facility-administered medications prior to admission   Medication  Dose  Route  Frequency  Provider  Last Rate  Last Dose   .   hydroxyprogesterone caproate (DELALUTIN) 250 mg/mL injection 250 mg   250 mg  Intramuscular  Weekly  Peggy Constant, MD     250 mg at 05/14/13 1157     Prescriptions prior to admission   Medication    Sig   .  acetaminophen (TYLENOL) 500 MG tablet  Take 1,000 mg by mouth.         .  hydroxyprogesterone caproate (DELALUTIN) 250 mg/mL OIL injection  Inject 250 mg into the muscle once a week. First  dose was 02/26/13. For 19 doses.         Marland Kitchen  insulin lispro (HUMALOG) 100 UNIT/ML injection  Inject 2-14 Units into the skin 4 (four) times daily. Correction scale: fasting and 2 hrs after first bite of each meal: Fasting @ 8am and 2 hrs after the start of each meal 121-160--2 units 161-200--4 units 201-240--6 units 241-280--8 units 281-320--10 units 321-360--12 units 361-420--14 units (call MD if symptomatic-headache, frequent urination, excessive thirst         .  insulin NPH (HUMULIN N,NOVOLIN N) 100 UNIT/ML injection  Inject 10 Units into the skin 2 (two) times daily at 8 am and 10 pm.          .  Prenatal Vit-Fe Fumarate-FA (PRENATAL MULTIVITAMIN) TABS tablet  Take 1 tablet by mouth daily at 12 noon.          Review of Systems  Constitutional: Positive for fever, chills, malaise/fatigue and diaphoresis.  HENT: Positive for congestion and sore throat.   Respiratory: Positive for cough, hemoptysis and sputum production. Negative for shortness of breath and wheezing.   Cardiovascular: Negative for chest pain.  Gastrointestinal: Positive for nausea and vomiting. Negative for abdominal pain, diarrhea and constipation.  Genitourinary: Negative for dysuria, urgency, frequency and hematuria.  Musculoskeletal: Positive for back pain.  Neurological: Positive for headaches.   Physical Exam   Blood pressure 134/68, pulse 120, temperature 99.4 F (37.4 C), temperature source Oral, resp. rate 18, SpO2 98.00%. Constitutional: She is oriented to person, place, and time. She is cooperative.  Non-toxic  appearance. She has a sickly appearance. She appears ill. No distress.  HENT:   Head: Normocephalic.   Mouth/Throat: Mucous membranes are dry.  Eyes: Lids are normal.  Cardiovascular: Tachycardia present.   Respiratory: Effort normal. No respiratory distress. She has decreased breath sounds in the right lower field and the left lower field. She has rhonchi in the right upper field, the right lower field, the left upper field and the left lower field. There is dullness to percussion in both bases. GI: Soft, gravid, NT Neurological: She is alert and oriented to person, place, and time.    Fetal Tracing: Baseline: 145 bpm Variability: Moderate   Accelerations: 10x10 Decelerations: variables Toco: UI     Results CBC     Status: Abnormal     Collection Time      05/19/13 11:19 AM       Result  Value  Range     WBC  6.9   4.0 - 10.5 K/uL     RBC  3.71 (*)  3.87 - 5.11 MIL/uL     Hemoglobin  11.5 (*)  12.0 - 15.0 g/dL     HCT  33.4 (*)  36.0 - 46.0 %     MCV  90.0   78.0 - 100.0 fL     MCH  31.0   26.0 - 34.0 pg     MCHC  34.4   30.0 - 36.0 g/dL     RDW  12.0   11.5 - 15.5 %     Platelets  252   150 - 400 K/uL    URINALYSIS, ROUTINE W REFLEX MICROSCOPIC     Status: Abnormal     Collection Time      05/19/13 12:08 PM       Result  Value     Range     Color, Urine  YELLOW     YELLOW     APPearance  CLEAR     CLEAR     Specific Gravity, Urine  1.020    1.005 - 1.030     pH  8.5 (*)  5.0 - 8.0     Glucose, UA  NEGATIVE   NEGATIVE mg/dL     Hgb urine dipstick  NEGATIVE    NEGATIVE     Bilirubin Urine  NEGATIVE     NEGATIVE     Ketones, ur  NEGATIVE    NEGATIVE mg/dL     Protein, ur  30 (*)     NEGATIVE mg/dL     Urobilinogen, UA  1.0     0.0 - 1.0 mg/dL     Nitrite  NEGATIVE      NEGATIVE     Leukocytes, UA  NEGATIVE    NEGATIVE   URINE MICROSCOPIC-ADD ON         Squamous Epithelial / LPF  FEW (*)  RARE     RBC / HPF  0-2   <3 RBC/hpf     Bacteria, UA  RARE   RARE      Urine-Other  MUCOUS PRESENT       GLUCOSE, CAPILLARY       Collection Time      05/19/13 12:34 PM        Result   Value    Range     Glucose-Capillary   113 (*)   70 - 99 mg/dL    Dg Chest 2 View 05/19/2013   CLINICAL DATA:  Cough and chest congestion.  EXAM: CHEST  2 VIEW  COMPARISON:  None.  FINDINGS: There are coarse lung markings in the right lower lobe posteriorly. A few coarse lung markings in the left lower lobe posteriorly are suspected as well. The cardio pericardial silhouette is normal in size. The pulmonary vascularity is not engorged. The mediastinum is normal in width. The observed portions of the bony thorax appear normal.  IMPRESSION: There is atelectasis versus early pneumonia in the right lower lobe. There may be similar involvement of the left lower lobe. There is no pleural effusion or evidence of CHF.   Electronically Signed   By: David  Martinique   On: 05/19/2013 13:28    Rapid Flu + for Influenza A    Assessment:  Patient Active Problem List   Diagnosis Date Noted  . Type 1 DM currently pregnant, antepartum 12/29/2012    Priority: High  . Previous cesarean delivery, antepartum condition or complication [id number]    Priority: Medium  . UTI (urinary tract infection) in pregnancy in first trimester 01/14/2013    Priority: Medium  . History of preterm delivery, currently pregnant in first trimester 01/08/2013    Priority: Medium  . Diabetic peripheral neuropathy associated with type 1 diabetes mellitus 01/08/2013    Priority: Medium  . Pneumonia 05/19/2013  . Influenza with pneumonia 05/19/2013  . Abnormal genetic test in pregnancy 05/09/2013  . Supervision of other high-risk pregnancy 05/09/2013  . Chlamydia infection complicating pregnancy in first trimester 01/11/2013   Influenza A with superimposed pneumonia to be treated for bacterial pneumonia and will start Tamiflu Strict BS control.

## 2013-05-20 DIAGNOSIS — J189 Pneumonia, unspecified organism: Secondary | ICD-10-CM

## 2013-05-20 LAB — GLUCOSE, CAPILLARY
Glucose-Capillary: 114 mg/dL — ABNORMAL HIGH (ref 70–99)
Glucose-Capillary: 83 mg/dL (ref 70–99)

## 2013-05-20 MED ORDER — GUAIFENESIN 100 MG/5ML PO SYRP
200.0000 mg | ORAL_SOLUTION | ORAL | Status: DC | PRN
  Administered 2013-05-20 – 2013-05-21 (×6): 200 mg via ORAL
  Administered 2013-05-21: 11:00:00 via ORAL
  Filled 2013-05-20 (×8): qty 10

## 2013-05-20 MED ORDER — INSULIN NPH (HUMAN) (ISOPHANE) 100 UNIT/ML ~~LOC~~ SUSP
12.0000 [IU] | Freq: Every day | SUBCUTANEOUS | Status: DC
  Administered 2013-05-20 – 2013-05-21 (×2): 12 [IU] via SUBCUTANEOUS

## 2013-05-20 MED ORDER — INSULIN NPH (HUMAN) (ISOPHANE) 100 UNIT/ML ~~LOC~~ SUSP
10.0000 [IU] | Freq: Every day | SUBCUTANEOUS | Status: DC
  Administered 2013-05-20: 10 [IU] via SUBCUTANEOUS

## 2013-05-20 NOTE — Progress Notes (Signed)
Patient refused fetal monitoring

## 2013-05-20 NOTE — Progress Notes (Signed)
Patient refused to wear monitors. She is refusing NST at this time.

## 2013-05-20 NOTE — Progress Notes (Signed)
Patient ID: Ann Robertson, female   DOB: 14-Jun-1990, 22 y.o.   MRN: [id number] [place]) NOTE  Ann Robertson is a 22 y.o. [phone] at [redacted]w[redacted]d by early ultrasound who is admitted for flu with pneumonia.   Fetal presentation is unsure. Length of Stay:  1  Days  Subjective: Feels bad.  Is achy. Has headache Patient reports the fetal movement as active. Patient reports uterine contraction  activity as none. Patient reports  vaginal bleeding as none. Patient describes fluid per vagina as None.  Vitals:  Blood pressure 125/83, pulse 115, temperature 98.5 F (36.9 C), temperature source Oral, resp. rate 24, height 5\' 4"  (1.626 m), weight 160 lb (72.576 kg), SpO2 97.00%. Physical Examination:  General appearance - alert, ill appearing, and in no distress Chest - rales noted bases bilaterally, decreased air entry noted at bases, dullness to percussion at bases Abdomen - soft, nontender, nondistended, gravid Extremities: extremities normal, atraumatic, no cyanosis or edema  Fetal Monitoring:  Baseline: 130 bpm, Variability: Good {> 6 bpm), Accelerations: Reactive and Decelerations: Absent  Labs:  Results for orders placed during the hospital encounter of 05/19/13 (from the past 24 hour(s))  INFLUENZA PANEL BY PCR   Collection Time    05/19/13 11:10 AM      Result Value Range   Influenza A By PCR POSITIVE (*) NEGATIVE   Influenza B By PCR NEGATIVE  NEGATIVE   H1N1 flu by pcr NOT DETECTED  NOT DETECTED  CBC   Collection Time    05/19/13 11:19 AM      Result Value Range   WBC 6.9  4.0 - 10.5 K/uL   RBC 3.71 (*) 3.87 - 5.11 MIL/uL   Hemoglobin 11.5 (*) 12.0 - 15.0 g/dL   HCT 33.4 (*) 36.0 - 46.0 %   MCV 90.0  78.0 - 100.0 fL   MCH 31.0  26.0 - 34.0 pg   MCHC 34.4  30.0 - 36.0 g/dL   RDW 12.0  11.5 - 15.5 %   Platelets 252  150 - 400 K/uL  URINALYSIS, ROUTINE W REFLEX MICROSCOPIC   Collection Time    05/19/13 12:08 PM      Result Value Range   Color,  Urine YELLOW  YELLOW   APPearance CLEAR  CLEAR   Specific Gravity, Urine 1.020  1.005 - 1.030   pH 8.5 (*) 5.0 - 8.0   Glucose, UA NEGATIVE  NEGATIVE mg/dL   Hgb urine dipstick NEGATIVE  NEGATIVE   Bilirubin Urine NEGATIVE  NEGATIVE   Ketones, ur NEGATIVE  NEGATIVE mg/dL   Protein, ur 30 (*) NEGATIVE mg/dL   Urobilinogen, UA 1.0  0.0 - 1.0 mg/dL   Nitrite NEGATIVE  NEGATIVE   Leukocytes, UA NEGATIVE  NEGATIVE  URINE MICROSCOPIC-ADD ON   Collection Time    05/19/13 12:08 PM      Result Value Range   Squamous Epithelial / LPF FEW (*) RARE   RBC / HPF 0-2  <3 RBC/hpf   Bacteria, UA RARE  RARE   Urine-Other MUCOUS PRESENT    GLUCOSE, CAPILLARY   Collection Time    05/19/13 12:34 PM      Result Value Range   Glucose-Capillary 113 (*) 70 - 99 mg/dL  GLUCOSE, CAPILLARY   Collection Time    05/19/13  4:15 PM      Result Value Range   Glucose-Capillary 156 (*) 70 - 99 mg/dL  TYPE AND SCREEN   Collection Time    05/19/13  6:35  PM      Result Value Range   ABO/RH(D) O POS     Antibody Screen NEG     Sample Expiration 05/22/2013    GLUCOSE, CAPILLARY   Collection Time    05/19/13  7:33 PM      Result Value Range   Glucose-Capillary 160 (*) 70 - 99 mg/dL  GLUCOSE, CAPILLARY   Collection Time    05/19/13  9:13 PM      Result Value Range   Glucose-Capillary 98  70 - 99 mg/dL     Medications:  Scheduled . azithromycin  500 mg Intravenous Q24H  . cefTRIAXone (ROCEPHIN)  IV  1 g Intravenous Q12H  . docusate sodium  100 mg Oral Daily  . [START ON 05/21/2013] hydroxyprogesterone caproate  250 mg Intramuscular Weekly  . insulin aspart  1-10 Units Subcutaneous TID WC  . insulin aspart  2-14 Units Subcutaneous TID BM  . insulin NPH  10 Units Subcutaneous BID AC & HS  . oseltamivir  75 mg Oral BID  . prenatal multivitamin  1 tablet Oral Q1200   I have reviewed the patient's current medications.  ASSESSMENT: Patient Active Problem List   Diagnosis Date Noted  . Type 1 DM  currently pregnant, antepartum 12/29/2012    Priority: High  . Previous cesarean delivery, antepartum condition or complication [id number]    Priority: Medium  . UTI (urinary tract infection) in pregnancy in first trimester 01/14/2013    Priority: Medium  . History of preterm delivery, currently pregnant in first trimester 01/08/2013    Priority: Medium  . Diabetic peripheral neuropathy associated with type 1 diabetes mellitus 01/08/2013    Priority: Medium  . Pneumonia 05/19/2013  . Influenza with pneumonia 05/19/2013  . Abnormal genetic test in pregnancy 05/09/2013  . Supervision of other high-risk pregnancy 05/09/2013  . Chlamydia infection complicating pregnancy in first trimester 01/11/2013    PLAN: Inpt. Treatment for now.  Closely monitor CBG's. Continue Rocephin and Zithromax, continue Tamiflu.  Ann Robertson S 05/20/2013,7:57 AM

## 2013-05-21 ENCOUNTER — Ambulatory Visit: Payer: Medicaid Other

## 2013-05-21 ENCOUNTER — Encounter: Payer: Self-pay | Admitting: *Deleted

## 2013-05-21 LAB — GLUCOSE, CAPILLARY
Glucose-Capillary: 154 mg/dL — ABNORMAL HIGH (ref 70–99)
Glucose-Capillary: 176 mg/dL — ABNORMAL HIGH (ref 70–99)
Glucose-Capillary: 34 mg/dL — CL (ref 70–99)
Glucose-Capillary: 75 mg/dL (ref 70–99)

## 2013-05-21 MED ORDER — AZITHROMYCIN 250 MG PO TABS: ORAL_TABLET | ORAL | Status: DC

## 2013-05-21 MED ORDER — OSELTAMIVIR PHOSPHATE 75 MG PO CAPS: 75.0000 mg | ORAL_CAPSULE | Freq: Two times a day (BID) | ORAL | Status: DC

## 2013-05-21 MED ORDER — OXYCODONE-ACETAMINOPHEN 5-325 MG PO TABS: 1.0000 | ORAL_TABLET | ORAL | Status: DC | PRN

## 2013-05-21 MED ORDER — INSULIN ASPART 100 UNIT/ML ~~LOC~~ SOLN: 1.0000 [IU] | Freq: Three times a day (TID) | SUBCUTANEOUS | Status: DC

## 2013-05-21 MED ORDER — GUAIFENESIN 100 MG/5ML PO SYRP: 200.0000 mg | ORAL_SOLUTION | ORAL | Status: DC | PRN

## 2013-05-21 NOTE — Discharge Instructions (Signed)
Influenza A (H1N1) in Pregnancy H1N1 formerly called "swine flu" is a new influenza virus causing sickness in people. The H1N1 virus is different from seasonal influenza viruses. However, the H1N1 symptoms are similar to seasonal influenza, and it is easily spread from person to person.  Pregnancy weakens the immune system, making it easier to catch infections and the H1N1 virus. Also, as the baby grows, the mother has less lung function. When there is less lung function, the mother is more likely to suffer from pneumonia, have kidney failure or a blood clot to the lung if they catch the flu. Currently, a vaccine is being produced to protect people from getting the H1N1 flu. It is safe for the mother and fetus. Pregnant women should not take the nasal mist vaccine because the spray contains the live virus, even though it's strength is weakened (attenuated). If you are pregnant and think you have H1N1, call your caregiver right away. The CDC and the [facility] are following reported cases around the world.  CAUSES  The flu is thought to spread mainly person-to-person through coughing or sneezing of infected people.  A person may become infected by touching something with the virus on it and then touching their mouth or nose. SYMPTOMS   Fever.  Headache.  Tiredness.  Cough.  Sore throat.  Runny or stuffy nose.  Body aches.  Diarrhea and vomiting These symptoms are referred to as "flu-like symptoms." A lot of different illnesses, including the common cold, may have similar symptoms. DIAGNOSIS   There are tests that can tell if you have the H1N1 virus.  Confirmed cases of H1N1 will be reported to the state or local health department.  A doctor's exam may be needed to tell whether you have an infection that is a complication of the flu. TREATMENT   Start treatment as soon as possible when symptoms occur.  Get a lot of sleep and rest.  Drink a lot of fluids.  Eat a  balanced diet.  Take your vitamins and mineral supplements as recommended.  Only take over-the-counter or prescription medicines as directed by your caregiver.  Take medication, Tamiflu or Relenza that help fight the flu with the permission of your caregiver. They are safe to take when pregnant. PREVENTION   Cover your nose and mouth with a tissue or your arm when you cough or sneeze. Throw the tissue away.  Wash your hands often with soap and warm water, especially after you cough or sneeze. Alcohol-based cleaners are also effective against germs.  Avoid touching your eyes, nose or mouth. This is one way germs spread.  Try to avoid contact with sick people. Follow public health advice regarding school closures. Avoid crowds.  Stay home if you get sick. Limit contact with others to keep from infecting them. People infected with the H1N1 virus may be able to infect others anywhere from 1 day before feeling sick to 5-7 days after getting flu symptoms.  An H1N1 vaccine is available to help protect against the virus. In addition to the H1N1 vaccine, you will need to be vaccinated for seasonal influenza. The H1N1 and seasonal vaccines may be given on the same day. FACEMASKS In community and home settings, the use of facemasks and N95 respirators are not normally recommended. In certain circumstances, a facemask or N95 respirator may be used for persons at increased risk of severe illness from influenza. Your caregiver can give additional recommendations for facemask use. [place]   Stay  informed. Visit the [facility] website for current recommendations. Visit [employer]. You may also call 1-800-CDC-INFO 470 329 3679).  If you are pregnant, talk to your caregiver as soon as you develop flu-like symptoms.  If you get the flu, get plenty of rest, drink enough water and fluids to keep your urine clear or pale yellow, and avoid using alcohol or tobacco.  You may take  over-the-counter medicine to relieve the symptoms of the flu if your caregiver approves.  Avoid mingling in large crowds and crowed places.  Wash your hands with soap and warm water especially after coughing or sneezing.  Cover you face when coughing or sneezing.  Wear a mask and stay at a good distance if someone in your family has the swine flu.  Avoid touching your eyes, nose and mouth.  Avoid people who are sick.  Stay home from work or school if you are getting sick. SEEK MEDICAL CARE IF:   You feel like you are getting the flu, see the symptoms stated above.  If you develop a temperature a fever with or without chills.  You think someone in your family is getting the flu.  You have come in contact with someone who has the swine flu.  You want advice on what medications are safe to take or you need a prescription for medication. SEEK IMMEDIATE MEDICAL CARE IF:   Your flu-like symptoms improve but return with fever and worse cough.  You develop a temperature of 102 F (38.9 C) or higher.  You are short of breath or have a hard time breathing.  You have vaginal bleeding.  You do not feel the baby moving or the baby is moving less than usual.  You develop uterine contractions.  You have leaking or a gush of fluid from the vagina.  You develop severe or persistent vomiting.  You feel dizzy, confused or turn bluish in color.  You have pain or pressure in the chest or abdomen. Document Released: 03/19/2008 Document Revised: 08/09/2011 Document Reviewed: 03/19/2008 [facility] Patient Information 2014 [place], [place].

## 2013-05-21 NOTE — Progress Notes (Signed)
CBG 34. Snack given. Will recheck in 30 min.

## 2013-05-21 NOTE — Discharge Summary (Signed)
Physician Discharge Summary  Patient ID: Ann Robertson MRN: [id number] DOB/AGE: [birth year] 24 y.o.  Admit date: 05/19/2013 Discharge date: 05/21/2013  Admission Diagnoses:Pregnancy 29 weeks with flu, pneumonia. Class C diabetes  Discharge Diagnoses: Same Principal Problem:   Pneumonia Active Problems:   Type 1 DM currently pregnant, antepartum   History of preterm delivery, currently pregnant in first trimester   Previous cesarean delivery, antepartum condition or complication   Influenza with pneumonia   Presented 05/19/13 with cough, fever, flu symptoms [redacted] weeks gestation, h/o class C diabetes and preterm delivery. Discharged Condition: fair  Hospital Course: treated with IV zithromax and rocephin and Tamiflu, improved  Consults: None  Significant Diagnostic Studies: labs: influenza a positive  Treatments: IV hydration, antibiotics: ceftriaxone, azithromycin and Tamiflu and analgesia: Percocet  Discharge Exam: Blood pressure 108/62, pulse 98, temperature 98.4 F (36.9 C), temperature source Oral, resp. rate 18, height 5\' 4"  (1.626 m), weight 160 lb (72.576 kg), SpO2 95.00%. General appearance: alert, cooperative and mild distress Resp: rales posterior - left Extremities: extremities normal, atraumatic, no cyanosis or edema CBC    Component Value Date/Time   WBC 6.9 05/19/2013 1119   RBC 3.71* 05/19/2013 1119   HGB 11.5* 05/19/2013 1119   HGB 13.9 12/21/2012   HCT 33.4* 05/19/2013 1119   HCT 42 12/21/2012   PLT 252 05/19/2013 1119   PLT 360 12/21/2012   MCV 90.0 05/19/2013 1119   MCH 31.0 05/19/2013 1119   MCHC 34.4 05/19/2013 1119   RDW 12.0 05/19/2013 1119   LYMPHSABS 1.8 01/08/2013 1013   MONOABS 0.3 01/08/2013 1013   EOSABS 0.1 01/08/2013 1013   BASOSABS 0.0 01/08/2013 1013     Disposition: 01-Home or Self Care   Future Appointments Provider Department Dept Phone   05/21/2013 10:30 AM [facility] [phone]   05/28/2013 10:30  AM Mora Bellman, MD [facility] [phone]   06/04/2013 10:30 AM [facility] [place] [phone]       Medication List    STOP taking these medications       acetaminophen 500 MG tablet  Commonly known as:  TYLENOL      TAKE these medications       azithromycin 250 MG tablet  Commonly known as:  ZITHROMAX  1 po daily 3 days to finish 5 day course     guaifenesin 100 MG/5ML syrup  Commonly known as:  ROBITUSSIN  Take 10 mLs (200 mg total) by mouth every 4 (four) hours as needed for congestion.     hydroxyprogesterone caproate 250 mg/mL Oil injection  Commonly known as:  DELALUTIN  Inject 250 mg into the muscle once a week. First dose was 02/26/13. For 19 doses.     insulin aspart 100 UNIT/ML injection  Commonly known as:  novoLOG  Inject 1-10 Units into the skin 3 (three) times daily with meals.     insulin lispro 100 UNIT/ML injection  Commonly known as:  HUMALOG  - Inject 2-14 Units into the skin 4 (four) times daily. Correction scale: fasting and 2 hrs after first bite of each meal:  - Fasting @ 8am and 2 hrs after the start of each meal  - 121-160--2 units  - 161-200--4 units  - 201-240--6 units  - 241-280--8 units  - 281-320--10 units  - 321-360--12 units  - 361-420--14 units (call MD if symptomatic-headache, frequent urination, excessive thirst     insulin NPH 100 UNIT/ML injection  Commonly known as:  HUMULIN N,NOVOLIN N  Inject 10 Units into the skin 2 (two) times daily at 8 am and 10 pm.     oseltamivir 75 MG capsule  Commonly known as:  TAMIFLU  Take 1 capsule (75 mg total) by mouth 2 (two) times daily.     oxyCODONE-acetaminophen 5-325 MG per tablet  Commonly known as:  PERCOCET/ROXICET  Take 1-2 tablets by mouth every 4 (four) hours as needed for severe pain.     prenatal multivitamin Tabs tablet  Take 1 tablet by mouth daily at 12 noon.           Follow-up Information   Follow up  with [facility] In 1 week.      Signed: Daveyon Kitchings 05/21/2013, 8:01 AM

## 2013-05-24 ENCOUNTER — Encounter: Payer: Self-pay | Admitting: Advanced Practice Midwife

## 2013-05-28 ENCOUNTER — Encounter: Payer: Medicaid Other | Admitting: Obstetrics and Gynecology

## 2013-06-01 ENCOUNTER — Other Ambulatory Visit: Payer: Self-pay | Admitting: Obstetrics and Gynecology

## 2013-06-01 DIAGNOSIS — O24019 Pre-existing diabetes mellitus, type 1, in pregnancy, unspecified trimester: Secondary | ICD-10-CM

## 2013-06-04 ENCOUNTER — Ambulatory Visit (INDEPENDENT_AMBULATORY_CARE_PROVIDER_SITE_OTHER): Payer: Medicaid Other | Admitting: *Deleted

## 2013-06-04 ENCOUNTER — Ambulatory Visit (HOSPITAL_COMMUNITY)
Admission: RE | Admit: 2013-06-04 | Discharge: 2013-06-04 | Disposition: A | Discharge status: Home / Self Care | Payer: Medicaid Other | Source: Ambulatory Visit | Attending: Obstetrics and Gynecology | Admitting: Obstetrics and Gynecology

## 2013-06-04 VITALS — BP 129/88 | HR 102 | Temp 97.5°F | Wt 160.5 lb

## 2013-06-04 DIAGNOSIS — Z8751 Personal history of pre-term labor: Secondary | ICD-10-CM | POA: Insufficient documentation

## 2013-06-04 DIAGNOSIS — O34219 Maternal care for unspecified type scar from previous cesarean delivery: Secondary | ICD-10-CM | POA: Insufficient documentation

## 2013-06-04 DIAGNOSIS — O24019 Pre-existing diabetes mellitus, type 1, in pregnancy, unspecified trimester: Secondary | ICD-10-CM

## 2013-06-04 DIAGNOSIS — O09891 Supervision of other high risk pregnancies, first trimester: Secondary | ICD-10-CM

## 2013-06-04 DIAGNOSIS — O289 Unspecified abnormal findings on antenatal screening of mother: Secondary | ICD-10-CM | POA: Insufficient documentation

## 2013-06-04 DIAGNOSIS — O09219 Supervision of pregnancy with history of pre-term labor, unspecified trimester: Secondary | ICD-10-CM

## 2013-06-04 DIAGNOSIS — O09211 Supervision of pregnancy with history of pre-term labor, first trimester: Principal | ICD-10-CM

## 2013-06-04 DIAGNOSIS — O24919 Unspecified diabetes mellitus in pregnancy, unspecified trimester: Secondary | ICD-10-CM | POA: Insufficient documentation

## 2013-06-11 ENCOUNTER — Encounter: Payer: Self-pay | Admitting: Obstetrics and Gynecology

## 2013-06-11 ENCOUNTER — Ambulatory Visit (INDEPENDENT_AMBULATORY_CARE_PROVIDER_SITE_OTHER): Payer: Medicaid Other | Admitting: Obstetrics and Gynecology

## 2013-06-11 VITALS — BP 122/83 | Temp 98.6°F | Wt 162.3 lb

## 2013-06-11 DIAGNOSIS — O98319 Other infections with a predominantly sexual mode of transmission complicating pregnancy, unspecified trimester: Secondary | ICD-10-CM

## 2013-06-11 DIAGNOSIS — O34219 Maternal care for unspecified type scar from previous cesarean delivery: Secondary | ICD-10-CM

## 2013-06-11 DIAGNOSIS — O289 Unspecified abnormal findings on antenatal screening of mother: Secondary | ICD-10-CM

## 2013-06-11 DIAGNOSIS — O285 Abnormal chromosomal and genetic finding on antenatal screening of mother: Secondary | ICD-10-CM

## 2013-06-11 DIAGNOSIS — Z23 Encounter for immunization: Secondary | ICD-10-CM

## 2013-06-11 DIAGNOSIS — E1042 Type 1 diabetes mellitus with diabetic polyneuropathy: Secondary | ICD-10-CM

## 2013-06-11 DIAGNOSIS — O24913 Unspecified diabetes mellitus in pregnancy, third trimester: Secondary | ICD-10-CM

## 2013-06-11 DIAGNOSIS — O98811 Other maternal infectious and parasitic diseases complicating pregnancy, first trimester: Secondary | ICD-10-CM

## 2013-06-11 DIAGNOSIS — O09893 Supervision of other high risk pregnancies, third trimester: Secondary | ICD-10-CM

## 2013-06-11 DIAGNOSIS — O24919 Unspecified diabetes mellitus in pregnancy, unspecified trimester: Secondary | ICD-10-CM

## 2013-06-11 DIAGNOSIS — A749 Chlamydial infection, unspecified: Secondary | ICD-10-CM

## 2013-06-11 DIAGNOSIS — E1049 Type 1 diabetes mellitus with other diabetic neurological complication: Secondary | ICD-10-CM

## 2013-06-11 DIAGNOSIS — A568 Sexually transmitted chlamydial infection of other sites: Secondary | ICD-10-CM

## 2013-06-11 DIAGNOSIS — G909 Disorder of the autonomic nervous system, unspecified: Secondary | ICD-10-CM

## 2013-06-11 DIAGNOSIS — O09899 Supervision of other high risk pregnancies, unspecified trimester: Secondary | ICD-10-CM

## 2013-06-11 LAB — POCT URINALYSIS DIP (DEVICE)
Bilirubin Urine: NEGATIVE
Glucose, UA: NEGATIVE mg/dL
Hgb urine dipstick: NEGATIVE
Ketones, ur: NEGATIVE mg/dL
Nitrite: NEGATIVE
Protein, ur: 100 mg/dL — AB
Specific Gravity, Urine: 1.025 (ref 1.005–1.030)
Urobilinogen, UA: 1 mg/dL (ref 0.0–1.0)
pH: 7 (ref 5.0–8.0)

## 2013-06-11 LAB — US OB FOLLOW UP

## 2013-06-11 NOTE — Progress Notes (Signed)
Patient is doing well, describing third trimester discomfort. EFW 1976 gm (74%tile) at 31 weeks. FM/PTL precautions reviewed. Patient strongly desires repeat cesarean section and would like to pick the date. Informed patient that due to her diabetes she will be delivered at 39 weeks and not prior (unless spontaneous onset of labor or PROM). Also explained to patient that 39 weeks falls on a Sunday and most likely delivery date will be on Monday at [redacted]w[redacted]d. Patient became very upset and desires the delivery to be at [redacted]w[redacted]d. Explained again to the patient reasoning behind 39 week delivery plan- will try to schedule on Sunday. CBGs all out of range with highest fasting in 200 range and highest pp 400 range. Discussed with patient glycemic goal during pregnancy and need to increase Insulin. Patient refused and states that insulin was increased on Thursday by endocrinologist and she is afraid of running too low. She is scheduled to see endocrinologist this week and is still waiting for insulin pump. Discussed fetal impact of poorly controlled diabetes and patient responded with "I have had diabetes since the age of 13 and did not have any complications with my son. [facility] and things are getting too complicated" Patient request Rx percocet refill for lower back pain and headaches as it was given to her while she was being treated for flu and pneumonia. Discussed that we do not prescribe percocet in pregnancy for 3rd trimester discomfort and she may benefit with a referral to headache specialist. Patient declined referral and states she will get her percocet from her PCP. Discussed risks of NAS with narcotic use in pregnancy.  Patient concerned about preterm delivery and desires cervical check given her low back pain- cervix FT/thick/long/post. Reassurance provided. Stretching exercises recommended Continue weekly 17-P  NST reviewed and reactive

## 2013-06-11 NOTE — Progress Notes (Signed)
Pulse- 100 Patient reports pelvic, abdominal, and back pain/pressure

## 2013-06-15 ENCOUNTER — Ambulatory Visit (INDEPENDENT_AMBULATORY_CARE_PROVIDER_SITE_OTHER): Payer: Medicaid Other | Admitting: General Practice

## 2013-06-15 VITALS — BP 124/82 | Wt 159.9 lb

## 2013-06-15 DIAGNOSIS — O24919 Unspecified diabetes mellitus in pregnancy, unspecified trimester: Secondary | ICD-10-CM

## 2013-06-15 DIAGNOSIS — E119 Type 2 diabetes mellitus without complications: Secondary | ICD-10-CM

## 2013-06-15 NOTE — Progress Notes (Signed)
NST performed today was reviewed and was found to be reactive.  Continue recommended antenatal testing and prenatal care.  

## 2013-06-15 NOTE — Progress Notes (Signed)
Pulse: 99 

## 2013-06-18 ENCOUNTER — Other Ambulatory Visit: Payer: Medicaid Other

## 2013-06-19 ENCOUNTER — Other Ambulatory Visit: Payer: Medicaid Other

## 2013-06-20 ENCOUNTER — Encounter: Payer: Self-pay | Admitting: *Deleted

## 2013-06-22 ENCOUNTER — Ambulatory Visit (INDEPENDENT_AMBULATORY_CARE_PROVIDER_SITE_OTHER): Payer: Medicaid Other | Admitting: *Deleted

## 2013-06-22 VITALS — BP 118/80

## 2013-06-22 DIAGNOSIS — O24919 Unspecified diabetes mellitus in pregnancy, unspecified trimester: Secondary | ICD-10-CM

## 2013-06-22 NOTE — Progress Notes (Signed)
P = 98

## 2013-06-23 NOTE — Progress Notes (Signed)
NST 06/22/13 reactive

## 2013-06-25 ENCOUNTER — Inpatient Hospital Stay (HOSPITAL_COMMUNITY)
Admission: RE | Admit: 2013-06-25 | Discharge: 2013-06-25 | Disposition: A | Discharge status: Home / Self Care | Payer: Medicaid Other | Source: Ambulatory Visit | Attending: Obstetrics & Gynecology | Admitting: Obstetrics & Gynecology

## 2013-06-25 ENCOUNTER — Encounter (HOSPITAL_COMMUNITY): Payer: Self-pay | Admitting: *Deleted

## 2013-06-25 ENCOUNTER — Ambulatory Visit (INDEPENDENT_AMBULATORY_CARE_PROVIDER_SITE_OTHER): Payer: Medicaid Other | Admitting: Obstetrics & Gynecology

## 2013-06-25 VITALS — BP 136/88 | Wt 161.1 lb

## 2013-06-25 DIAGNOSIS — O34219 Maternal care for unspecified type scar from previous cesarean delivery: Secondary | ICD-10-CM

## 2013-06-25 DIAGNOSIS — O09219 Supervision of pregnancy with history of pre-term labor, unspecified trimester: Secondary | ICD-10-CM

## 2013-06-25 DIAGNOSIS — E1142 Type 2 diabetes mellitus with diabetic polyneuropathy: Secondary | ICD-10-CM

## 2013-06-25 DIAGNOSIS — O24919 Unspecified diabetes mellitus in pregnancy, unspecified trimester: Secondary | ICD-10-CM

## 2013-06-25 DIAGNOSIS — O98811 Other maternal infectious and parasitic diseases complicating pregnancy, first trimester: Secondary | ICD-10-CM

## 2013-06-25 DIAGNOSIS — O09891 Supervision of other high risk pregnancies, first trimester: Secondary | ICD-10-CM

## 2013-06-25 DIAGNOSIS — N898 Other specified noninflammatory disorders of vagina: Secondary | ICD-10-CM

## 2013-06-25 DIAGNOSIS — E1042 Type 1 diabetes mellitus with diabetic polyneuropathy: Secondary | ICD-10-CM

## 2013-06-25 DIAGNOSIS — A749 Chlamydial infection, unspecified: Secondary | ICD-10-CM

## 2013-06-25 DIAGNOSIS — O09899 Supervision of other high risk pregnancies, unspecified trimester: Secondary | ICD-10-CM

## 2013-06-25 DIAGNOSIS — E1049 Type 1 diabetes mellitus with other diabetic neurological complication: Secondary | ICD-10-CM

## 2013-06-25 DIAGNOSIS — O99891 Other specified diseases and conditions complicating pregnancy: Secondary | ICD-10-CM | POA: Insufficient documentation

## 2013-06-25 DIAGNOSIS — O2341 Unspecified infection of urinary tract in pregnancy, first trimester: Secondary | ICD-10-CM

## 2013-06-25 DIAGNOSIS — O289 Unspecified abnormal findings on antenatal screening of mother: Secondary | ICD-10-CM

## 2013-06-25 DIAGNOSIS — O09211 Supervision of pregnancy with history of pre-term labor, first trimester: Secondary | ICD-10-CM

## 2013-06-25 DIAGNOSIS — Z87891 Personal history of nicotine dependence: Secondary | ICD-10-CM | POA: Insufficient documentation

## 2013-06-25 DIAGNOSIS — O285 Abnormal chromosomal and genetic finding on antenatal screening of mother: Secondary | ICD-10-CM

## 2013-06-25 DIAGNOSIS — O26899 Other specified pregnancy related conditions, unspecified trimester: Secondary | ICD-10-CM

## 2013-06-25 DIAGNOSIS — O98819 Other maternal infectious and parasitic diseases complicating pregnancy, unspecified trimester: Secondary | ICD-10-CM

## 2013-06-25 DIAGNOSIS — O9989 Other specified diseases and conditions complicating pregnancy, childbirth and the puerperium: Principal | ICD-10-CM

## 2013-06-25 LAB — US OB FOLLOW UP

## 2013-06-25 LAB — AMNISURE RUPTURE OF MEMBRANE (ROM) NOT AT ARMC: AMNISURE: NEGATIVE

## 2013-06-25 LAB — POCT URINALYSIS DIP (DEVICE)
Bilirubin Urine: NEGATIVE
GLUCOSE, UA: 250 mg/dL — AB
Ketones, ur: NEGATIVE mg/dL
Leukocytes, UA: NEGATIVE
NITRITE: NEGATIVE
Protein, ur: 100 mg/dL — AB
SPECIFIC GRAVITY, URINE: 1.025 (ref 1.005–1.030)
UROBILINOGEN UA: 1 mg/dL (ref 0.0–1.0)
pH: 7 (ref 5.0–8.0)

## 2013-06-25 LAB — WET PREP, GENITAL
Clue Cells Wet Prep HPF POC: NONE SEEN
Trich, Wet Prep: NONE SEEN
YEAST WET PREP: NONE SEEN

## 2013-06-25 NOTE — Progress Notes (Signed)
Pulse: 107

## 2013-06-25 NOTE — Patient Instructions (Signed)
Return to clinic for any obstetric concerns or go to [facility] for evaluation  

## 2013-06-25 NOTE — Progress Notes (Signed)
NST performed today was reviewed and was found to be reactive. AFI normal at 16.6 cm.  Continue recommended antenatal testing and prenatal care. No other complaints or concerns.  Fetal movement and labor precautions reviewed.

## 2013-06-25 NOTE — Discharge Instructions (Signed)
Premature Rupture and Preterm Premature Rupture of Membranes Premature rupture of membranes (PROM) is when the membranes (amniotic sac) break open before contractions or labor starts. Rupture of membranes is commonly referred to as your water breaking. If PROM occurs before 37 weeks of pregnancy, it is called preterm premature rupture of membranes (PPROM). The amniotic sac holds the fetus, keeps infection out, and performs other important functions. Having the amniotic sac rupture before 37 weeks of pregnancy can lead to serious problems and requires immediate attention by your health care provider. CAUSES  PROM near the end of the pregnancy may be caused by natural weakening of the membranes. PPROM is often due to an infection. Other factors that may be associated with PROM include:  Stretching of the amniotic sac because of carrying multiples or having too much amniotic fluid.  Trauma.  Smoking during pregnancy.  Poor nutrition.  Previous preterm birth.  Vaginal bleeding.  Little to no prenatal care.  Problems with the placenta, such as placenta previa or placental abruption. RISKS OF PROM AND PPROM  Delivering a premature baby.  Getting a serious infection of the placental tissues (chorioamnionitis).  Early detachment of the placenta from the uterus (placental abruption).  Compression of the umbilical cord.  Needing a cesarean birth.  Developing a serious infection after delivery. SIGNS OF PROM OR PPROM   A sudden gush or slow leaking of fluid from the vagina.  Constant wet underwear. Sometimes, women mistake the leaking or wetness for urine, especially if the leak is slow and not a gush of fluid. If there is constant leaking or your underwear continues to get wet, your membranes have likely ruptured. WHAT TO DO IF YOU THINK YOUR MEMBRANES HAVE RUPTURED Call your health care provider right away. You will need to go to the hospital to get checked immediately. WHAT HAPPENS  IF YOU ARE DIAGNOSED WITH PROM OR PPROM? Once you arrive at the hospital, you will have tests done. A cervical exam will be performed to check if the cervix has softened or started to open (dilate). If you are diagnosed with PROM, you may be induced within 24 hours if you are not having contractions. If you are diagnosed with PPROM and are not having contractions, you may be induced depending on your trimester.  If you have PPROM, you:  And your baby will be monitored closely for signs of infection or other complications.  May be given an antibiotic medicine to lower the chances of an infection developing.  May be given a steroid medicine to help mature the baby's lungs faster.  May be given a medicine to stop preterm labor.  May be ordered to be on bed rest at home or in the hospital.  May be induced if complications arise for you or the baby. Your treatment will depend on many factors, such as how far along you are, the development of the baby, and other complications that may arise. Document Released: 05/17/2005 Document Revised: 03/07/2013 Document Reviewed: 09/05/2012 [facility] Patient Information 2014 [place].

## 2013-06-25 NOTE — MAU Note (Signed)
Has been having leaking starting around 2 pm.  Braxton hicks contractions on and off

## 2013-06-25 NOTE — MAU Provider Note (Signed)
History     CSN: [id number]  Arrival date and time: 06/25/13 2027   First Provider Initiated Contact with Patient 06/25/13 2108      Chief Complaint  Patient presents with  . Rupture of Membranes   HPI Pt is a 23 y.o. [id number] at [redacted]w[redacted]d who presents with leaking fluids starting around 2pm. She reports she was lying in bed when she noticed that her underwear were wet, but there was no wet spot in the bed or anywhere else. She then took a bath and laid back down and her underwear again became very wet. She endorses some increase in her contractions today, but they have still not been very regular. She denies bleeding and reports normal fetal movement. 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  OB History   Grav Para Term Preterm Abortions TAB SAB Ect Mult Living   3 1 0 1 1 0 1 0 0 1       Past Medical History  Diagnosis Date  . Diabetes mellitus   . Juvenile diabetes mellitus   . Juvenile diabetes 2000  . Neuropathy   . High cholesterol   . HSV-2 infection   . STD (sexually transmitted disease)     Past Surgical History  Procedure Laterality Date  . Cesarean section      Family History  Problem Relation Age of Onset  . Stroke Mother   . Hypertension Mother   . Heart disease Mother   . Kidney disease Mother   . Hyperlipidemia Father   . Diabetes Father     History  Substance Use Topics  . Smoking status: Former Research scientist (life sciences)  . Smokeless tobacco: Not on file  . Alcohol Use: No    Allergies:  Allergies  Allergen Reactions  . Hydromorphone Hcl Rash    Facility-administered medications prior to admission  Medication Dose Route Frequency Provider Last Rate Last Dose  . hydroxyprogesterone caproate (DELALUTIN) 250 mg/mL injection 250 mg  250 mg Intramuscular Weekly Peggy Constant, MD   250 mg at 06/11/13 1154   Prescriptions prior to admission  Medication Sig Dispense Refill  .  hydroxyprogesterone caproate (DELALUTIN) 250 mg/mL OIL injection Inject 250 mg into the muscle once a week. First dose was 02/26/13. For 19 doses.      Marland Kitchen insulin lispro (HUMALOG) 100 UNIT/ML injection Inject 2-14 Units into the skin 4 (four) times daily. Correction scale: fasting and 2 hrs after first bite of each meal: Fasting @ 8am and 2 hrs after the start of each meal 121-160--2 units 161-200--4 units 201-240--6 units 241-280--8 units 281-320--10 units 321-360--12 units 361-420--14 units (call MD if symptomatic-headache, frequent urination, excessive thirst      . insulin NPH (HUMULIN N,NOVOLIN N) 100 UNIT/ML injection Inject 12 Units into the skin 2 (two) times daily at 8 am and 10 pm.       . Prenatal Vit-Fe Fumarate-FA (PRENATAL MULTIVITAMIN) TABS tablet Take 1 tablet by mouth daily at 12 noon.        ROS Physical Exam   Blood pressure 125/87, pulse 108, temperature 98.2 F (36.8 C), temperature source Oral, resp. rate 18, height 5\' 4"  (1.626 m), weight 73.029 kg (161 lb).  Physical Exam  Nursing note and vitals reviewed. Constitutional: She is oriented to person, place, and time. She appears well-developed and well-nourished. No distress.  HENT:  Head: Normocephalic and atraumatic.  Eyes: Conjunctivae are normal. Right eye  exhibits no discharge. Left eye exhibits no discharge. No scleral icterus.  Neck: Normal range of motion.  Cardiovascular: Normal rate.   Respiratory: Effort normal.  GI: Soft. There is no tenderness.  Genitourinary: Uterus normal. Vaginal discharge found.  Musculoskeletal: Normal range of motion. She exhibits no edema and no tenderness.  Neurological: She is alert and oriented to person, place, and time.  Skin: Skin is warm and dry. No rash noted. She is not diaphoretic.  Psychiatric: She has a normal mood and affect. Her behavior is normal.    MAU Course  Procedures  MDM Toto: contracting every 5-10 minutes FWB: bl 130/mod var/+accels/-  decels  Assessment and Plan  Pt is a 23 y.o. [id number] at [redacted]w[redacted]d who presents with leaking fluids. Ferning and pooling negative. Amnisure negative. Wet prep negative. Likely normal pregnancy-related discharge vs. Slight urinary incontinence. Discharge home with instructions to return for more fluid soaking through clothes, regular contractions, decreased fetal movement, fever.  Ann Robertson 06/25/2013, 9:28 PM   Fetal status reassuring and reactive but ended with a return to baseline vs variable, consistent with moderate variability. Baseline fluctuates while on monitor from 150s to 130s baseline.  I spoke with and examined patient and agree with resident's note and plan of care.  Fredrik Rigger, MD OB Fellow 06/25/2013 10:11 PM

## 2013-06-28 ENCOUNTER — Other Ambulatory Visit: Payer: Self-pay | Admitting: Family Medicine

## 2013-06-28 DIAGNOSIS — E109 Type 1 diabetes mellitus without complications: Secondary | ICD-10-CM

## 2013-06-29 ENCOUNTER — Ambulatory Visit (HOSPITAL_COMMUNITY)
Admission: RE | Admit: 2013-06-29 | Discharge: 2013-06-29 | Disposition: A | Discharge status: Home / Self Care | Payer: Medicaid Other | Source: Ambulatory Visit | Attending: Obstetrics & Gynecology | Admitting: Obstetrics & Gynecology

## 2013-06-29 ENCOUNTER — Ambulatory Visit (INDEPENDENT_AMBULATORY_CARE_PROVIDER_SITE_OTHER): Payer: Medicaid Other | Admitting: *Deleted

## 2013-06-29 VITALS — BP 122/79

## 2013-06-29 DIAGNOSIS — O24919 Unspecified diabetes mellitus in pregnancy, unspecified trimester: Secondary | ICD-10-CM | POA: Insufficient documentation

## 2013-06-29 DIAGNOSIS — O34219 Maternal care for unspecified type scar from previous cesarean delivery: Secondary | ICD-10-CM | POA: Insufficient documentation

## 2013-06-29 DIAGNOSIS — Z8751 Personal history of pre-term labor: Secondary | ICD-10-CM | POA: Insufficient documentation

## 2013-06-29 NOTE — Progress Notes (Signed)
P = 102    [place] for growth scheduled on 2/2

## 2013-06-29 NOTE — Progress Notes (Signed)
NST on 06/29/13--difficult to determine baseline.  Sent for BPP which is 8/8.

## 2013-07-02 ENCOUNTER — Ambulatory Visit (INDEPENDENT_AMBULATORY_CARE_PROVIDER_SITE_OTHER): Payer: Medicaid Other | Admitting: Obstetrics & Gynecology

## 2013-07-02 ENCOUNTER — Ambulatory Visit (HOSPITAL_COMMUNITY)
Admission: RE | Admit: 2013-07-02 | Discharge: 2013-07-02 | Disposition: A | Discharge status: Home / Self Care | Payer: Medicaid Other | Source: Ambulatory Visit | Attending: Obstetrics & Gynecology | Admitting: Obstetrics & Gynecology

## 2013-07-02 VITALS — BP 118/79 | Wt 165.2 lb

## 2013-07-02 DIAGNOSIS — O24919 Unspecified diabetes mellitus in pregnancy, unspecified trimester: Secondary | ICD-10-CM | POA: Insufficient documentation

## 2013-07-02 DIAGNOSIS — O98812 Other maternal infectious and parasitic diseases complicating pregnancy, second trimester: Secondary | ICD-10-CM

## 2013-07-02 DIAGNOSIS — O289 Unspecified abnormal findings on antenatal screening of mother: Secondary | ICD-10-CM | POA: Insufficient documentation

## 2013-07-02 DIAGNOSIS — O09213 Supervision of pregnancy with history of pre-term labor, third trimester: Secondary | ICD-10-CM

## 2013-07-02 DIAGNOSIS — O09893 Supervision of other high risk pregnancies, third trimester: Secondary | ICD-10-CM

## 2013-07-02 DIAGNOSIS — O34219 Maternal care for unspecified type scar from previous cesarean delivery: Secondary | ICD-10-CM | POA: Insufficient documentation

## 2013-07-02 DIAGNOSIS — O3660X0 Maternal care for excessive fetal growth, unspecified trimester, not applicable or unspecified: Secondary | ICD-10-CM

## 2013-07-02 DIAGNOSIS — A749 Chlamydial infection, unspecified: Secondary | ICD-10-CM

## 2013-07-02 DIAGNOSIS — O3663X0 Maternal care for excessive fetal growth, third trimester, not applicable or unspecified: Secondary | ICD-10-CM | POA: Insufficient documentation

## 2013-07-02 DIAGNOSIS — Z8751 Personal history of pre-term labor: Secondary | ICD-10-CM | POA: Insufficient documentation

## 2013-07-02 DIAGNOSIS — E109 Type 1 diabetes mellitus without complications: Secondary | ICD-10-CM

## 2013-07-02 DIAGNOSIS — O2390 Unspecified genitourinary tract infection in pregnancy, unspecified trimester: Secondary | ICD-10-CM

## 2013-07-02 DIAGNOSIS — N390 Urinary tract infection, site not specified: Secondary | ICD-10-CM

## 2013-07-02 DIAGNOSIS — O09219 Supervision of pregnancy with history of pre-term labor, unspecified trimester: Secondary | ICD-10-CM

## 2013-07-02 DIAGNOSIS — O98819 Other maternal infectious and parasitic diseases complicating pregnancy, unspecified trimester: Secondary | ICD-10-CM

## 2013-07-02 DIAGNOSIS — O2341 Unspecified infection of urinary tract in pregnancy, first trimester: Secondary | ICD-10-CM

## 2013-07-02 LAB — POCT URINALYSIS DIP (DEVICE)
Bilirubin Urine: NEGATIVE
Glucose, UA: NEGATIVE mg/dL
Hgb urine dipstick: NEGATIVE
Ketones, ur: NEGATIVE mg/dL
Nitrite: NEGATIVE
PROTEIN: 30 mg/dL — AB
Specific Gravity, Urine: 1.02 (ref 1.005–1.030)
Urobilinogen, UA: 1 mg/dL (ref 0.0–1.0)
pH: 7 (ref 5.0–8.0)

## 2013-07-02 LAB — FETAL NONSTRESS TEST

## 2013-07-02 NOTE — Progress Notes (Signed)
P = 93     [place] for growth @ MFM today

## 2013-07-02 NOTE — Progress Notes (Signed)
NST reactive in beginning then hard to tell baseline for next 20 minutes.  Pt getting grwoth [place] today so will add on a BPP.  Pt did not bring sugar log.  She is being managaed by endocrinologist.  She is well aware that we think her control should be tighter for maternal and child well being.  Pt refuses our intervention again today.  17-P today.

## 2013-07-03 LAB — GC/CHLAMYDIA PROBE AMP
CT PROBE, AMP APTIMA: NEGATIVE
GC Probe RNA: NEGATIVE

## 2013-07-05 ENCOUNTER — Encounter (HOSPITAL_COMMUNITY): Payer: Self-pay | Admitting: *Deleted

## 2013-07-05 ENCOUNTER — Ambulatory Visit (HOSPITAL_COMMUNITY)
Admission: RE | Admit: 2013-07-05 | Discharge: 2013-07-05 | Disposition: A | Discharge status: Home / Self Care | Payer: Medicaid Other | Source: Ambulatory Visit | Attending: Family Medicine | Admitting: Family Medicine

## 2013-07-05 ENCOUNTER — Inpatient Hospital Stay (HOSPITAL_COMMUNITY): Payer: Medicaid Other | Admitting: Anesthesiology

## 2013-07-05 ENCOUNTER — Inpatient Hospital Stay (HOSPITAL_COMMUNITY)
Admission: AD | Admit: 2013-07-05 | Discharge: 2013-07-08 | DRG: 765 | Disposition: A | Discharge status: Home / Self Care | Payer: Medicaid Other | Source: Ambulatory Visit | Attending: Obstetrics & Gynecology | Admitting: Obstetrics & Gynecology

## 2013-07-05 ENCOUNTER — Other Ambulatory Visit: Payer: Self-pay | Admitting: Family Medicine

## 2013-07-05 ENCOUNTER — Encounter (HOSPITAL_COMMUNITY)
Admission: AD | Disposition: A | Discharge status: Home / Self Care | Payer: Self-pay | Source: Ambulatory Visit | Attending: Obstetrics & Gynecology

## 2013-07-05 ENCOUNTER — Encounter (HOSPITAL_COMMUNITY): Payer: Medicaid Other | Admitting: Anesthesiology

## 2013-07-05 ENCOUNTER — Ambulatory Visit (INDEPENDENT_AMBULATORY_CARE_PROVIDER_SITE_OTHER): Payer: Medicaid Other | Admitting: *Deleted

## 2013-07-05 VITALS — BP 123/89 | Wt 164.8 lb

## 2013-07-05 DIAGNOSIS — A749 Chlamydial infection, unspecified: Secondary | ICD-10-CM

## 2013-07-05 DIAGNOSIS — O09899 Supervision of other high risk pregnancies, unspecified trimester: Secondary | ICD-10-CM

## 2013-07-05 DIAGNOSIS — O34219 Maternal care for unspecified type scar from previous cesarean delivery: Secondary | ICD-10-CM

## 2013-07-05 DIAGNOSIS — E1042 Type 1 diabetes mellitus with diabetic polyneuropathy: Secondary | ICD-10-CM

## 2013-07-05 DIAGNOSIS — E109 Type 1 diabetes mellitus without complications: Secondary | ICD-10-CM

## 2013-07-05 DIAGNOSIS — O24919 Unspecified diabetes mellitus in pregnancy, unspecified trimester: Secondary | ICD-10-CM

## 2013-07-05 DIAGNOSIS — Z87891 Personal history of nicotine dependence: Secondary | ICD-10-CM

## 2013-07-05 DIAGNOSIS — O289 Unspecified abnormal findings on antenatal screening of mother: Secondary | ICD-10-CM

## 2013-07-05 DIAGNOSIS — O285 Abnormal chromosomal and genetic finding on antenatal screening of mother: Secondary | ICD-10-CM

## 2013-07-05 DIAGNOSIS — E108 Type 1 diabetes mellitus with unspecified complications: Secondary | ICD-10-CM

## 2013-07-05 DIAGNOSIS — E1065 Type 1 diabetes mellitus with hyperglycemia: Secondary | ICD-10-CM | POA: Diagnosis present

## 2013-07-05 DIAGNOSIS — O3663X0 Maternal care for excessive fetal growth, third trimester, not applicable or unspecified: Secondary | ICD-10-CM

## 2013-07-05 DIAGNOSIS — O09219 Supervision of pregnancy with history of pre-term labor, unspecified trimester: Secondary | ICD-10-CM

## 2013-07-05 DIAGNOSIS — O99810 Abnormal glucose complicating pregnancy: Secondary | ICD-10-CM

## 2013-07-05 DIAGNOSIS — IMO0002 Reserved for concepts with insufficient information to code with codable children: Secondary | ICD-10-CM | POA: Diagnosis present

## 2013-07-05 DIAGNOSIS — Z9889 Other specified postprocedural states: Secondary | ICD-10-CM

## 2013-07-05 DIAGNOSIS — O09211 Supervision of pregnancy with history of pre-term labor, first trimester: Secondary | ICD-10-CM

## 2013-07-05 DIAGNOSIS — O98811 Other maternal infectious and parasitic diseases complicating pregnancy, first trimester: Secondary | ICD-10-CM

## 2013-07-05 DIAGNOSIS — Z794 Long term (current) use of insulin: Secondary | ICD-10-CM

## 2013-07-05 DIAGNOSIS — O24419 Gestational diabetes mellitus in pregnancy, unspecified control: Secondary | ICD-10-CM

## 2013-07-05 DIAGNOSIS — O09891 Supervision of other high risk pregnancies, first trimester: Secondary | ICD-10-CM

## 2013-07-05 DIAGNOSIS — O288 Other abnormal findings on antenatal screening of mother: Secondary | ICD-10-CM

## 2013-07-05 DIAGNOSIS — O2341 Unspecified infection of urinary tract in pregnancy, first trimester: Secondary | ICD-10-CM

## 2013-07-05 LAB — GLUCOSE, CAPILLARY
GLUCOSE-CAPILLARY: 301 mg/dL — AB (ref 70–99)
GLUCOSE-CAPILLARY: 259 mg/dL — AB (ref 70–99)

## 2013-07-05 LAB — URINALYSIS, ROUTINE W REFLEX MICROSCOPIC
Bilirubin Urine: NEGATIVE
Glucose, UA: 250 mg/dL — AB
HGB URINE DIPSTICK: NEGATIVE
Leukocytes, UA: NEGATIVE
Nitrite: NEGATIVE
PROTEIN: 30 mg/dL — AB
Specific Gravity, Urine: 1.02 (ref 1.005–1.030)
UROBILINOGEN UA: 1 mg/dL (ref 0.0–1.0)
pH: 6 (ref 5.0–8.0)

## 2013-07-05 LAB — TYPE AND SCREEN
ABO/RH(D): O POS
Antibody Screen: NEGATIVE

## 2013-07-05 LAB — URINE MICROSCOPIC-ADD ON

## 2013-07-05 LAB — CBC
HCT: 39 % (ref 36.0–46.0)
HEMOGLOBIN: 13.8 g/dL (ref 12.0–15.0)
MCH: 31.7 pg (ref 26.0–34.0)
MCHC: 35.4 g/dL (ref 30.0–36.0)
MCV: 89.4 fL (ref 78.0–100.0)
Platelets: 207 10*3/uL (ref 150–400)
RBC: 4.36 MIL/uL (ref 3.87–5.11)
RDW: 13 % (ref 11.5–15.5)
WBC: 11 10*3/uL — ABNORMAL HIGH (ref 4.0–10.5)

## 2013-07-05 LAB — RPR: RPR: NONREACTIVE

## 2013-07-05 SURGERY — Surgical Case
Anesthesia: Spinal | Site: Abdomen

## 2013-07-05 MED ORDER — BUPIVACAINE IN DEXTROSE 0.75-8.25 % IT SOLN
INTRATHECAL | Status: DC | PRN
  Administered 2013-07-05: 1.5 mL via INTRATHECAL

## 2013-07-05 MED ORDER — MORPHINE SULFATE (PF) 0.5 MG/ML IJ SOLN
INTRAMUSCULAR | Status: DC | PRN
  Administered 2013-07-05: .15 mg via INTRATHECAL

## 2013-07-05 MED ORDER — INSULIN ASPART 100 UNIT/ML ~~LOC~~ SOLN: 0.0000 [IU] | Freq: Three times a day (TID) | SUBCUTANEOUS | Status: DC

## 2013-07-05 MED ORDER — OXYCODONE-ACETAMINOPHEN 5-325 MG PO TABS
1.0000 | ORAL_TABLET | ORAL | Status: DC | PRN
  Administered 2013-07-06 – 2013-07-07 (×5): 1 via ORAL
  Administered 2013-07-07: 2 via ORAL
  Administered 2013-07-07 (×2): 1 via ORAL
  Administered 2013-07-08 (×3): 2 via ORAL
  Filled 2013-07-05 (×4): qty 1
  Filled 2013-07-05 (×3): qty 2
  Filled 2013-07-05 (×2): qty 1
  Filled 2013-07-05: qty 2
  Filled 2013-07-05: qty 1

## 2013-07-05 MED ORDER — IBUPROFEN 600 MG PO TABS: 600.0000 mg | ORAL_TABLET | Freq: Four times a day (QID) | ORAL | Status: DC | PRN

## 2013-07-05 MED ORDER — NALOXONE HCL 1 MG/ML IJ SOLN: 1.0000 ug/kg/h | INTRAVENOUS | Status: DC | PRN

## 2013-07-05 MED ORDER — ONDANSETRON HCL 4 MG/2ML IJ SOLN
INTRAMUSCULAR | Status: DC | PRN
  Administered 2013-07-05: 4 mg via INTRAVENOUS

## 2013-07-05 MED ORDER — LACTATED RINGERS IV SOLN
INTRAVENOUS | Status: DC | PRN
  Administered 2013-07-05 (×2): via INTRAVENOUS

## 2013-07-05 MED ORDER — DIPHENHYDRAMINE HCL 25 MG PO CAPS
25.0000 mg | ORAL_CAPSULE | ORAL | Status: DC | PRN
  Administered 2013-07-06: 25 mg via ORAL
  Filled 2013-07-05: qty 1

## 2013-07-05 MED ORDER — PRENATAL MULTIVITAMIN CH
1.0000 | ORAL_TABLET | Freq: Every day | ORAL | Status: DC
  Administered 2013-07-06 – 2013-07-07 (×2): 1 via ORAL
  Filled 2013-07-05 (×2): qty 1

## 2013-07-05 MED ORDER — KETOROLAC TROMETHAMINE 30 MG/ML IJ SOLN
INTRAMUSCULAR | Status: AC
  Administered 2013-07-05: 30 mg via INTRAMUSCULAR
  Filled 2013-07-05: qty 1

## 2013-07-05 MED ORDER — INSULIN NPH (HUMAN) (ISOPHANE) 100 UNIT/ML ~~LOC~~ SUSP
6.0000 [IU] | Freq: Two times a day (BID) | SUBCUTANEOUS | Status: DC
  Administered 2013-07-05: 6 [IU] via SUBCUTANEOUS

## 2013-07-05 MED ORDER — MENTHOL 3 MG MT LOZG: 1.0000 | LOZENGE | OROMUCOSAL | Status: DC | PRN

## 2013-07-05 MED ORDER — INSULIN ASPART 100 UNIT/ML ~~LOC~~ SOLN: 4.0000 [IU] | Freq: Three times a day (TID) | SUBCUTANEOUS | Status: DC

## 2013-07-05 MED ORDER — SENNOSIDES-DOCUSATE SODIUM 8.6-50 MG PO TABS
2.0000 | ORAL_TABLET | ORAL | Status: DC
  Administered 2013-07-05 – 2013-07-07 (×3): 2 via ORAL
  Filled 2013-07-05 (×3): qty 2

## 2013-07-05 MED ORDER — FENTANYL CITRATE 0.05 MG/ML IJ SOLN
INTRAMUSCULAR | Status: AC
  Filled 2013-07-05: qty 2

## 2013-07-05 MED ORDER — FENTANYL CITRATE 0.05 MG/ML IJ SOLN
INTRAMUSCULAR | Status: DC | PRN
  Administered 2013-07-05: 25 ug via INTRATHECAL

## 2013-07-05 MED ORDER — LACTATED RINGERS IV SOLN
INTRAVENOUS | Status: DC
  Administered 2013-07-05: 15:00:00 via INTRAVENOUS

## 2013-07-05 MED ORDER — PROMETHAZINE HCL 25 MG/ML IJ SOLN: 6.2500 mg | INTRAMUSCULAR | Status: DC | PRN

## 2013-07-05 MED ORDER — ONDANSETRON HCL 4 MG/2ML IJ SOLN: 4.0000 mg | Freq: Three times a day (TID) | INTRAMUSCULAR | Status: DC | PRN

## 2013-07-05 MED ORDER — PNEUMOCOCCAL VAC POLYVALENT 25 MCG/0.5ML IJ INJ
0.5000 mL | INJECTION | INTRAMUSCULAR | Status: DC
  Filled 2013-07-05: qty 0.5

## 2013-07-05 MED ORDER — MORPHINE SULFATE 0.5 MG/ML IJ SOLN
INTRAMUSCULAR | Status: AC
  Filled 2013-07-05: qty 10

## 2013-07-05 MED ORDER — KETOROLAC TROMETHAMINE 30 MG/ML IJ SOLN
30.0000 mg | Freq: Four times a day (QID) | INTRAMUSCULAR | Status: AC | PRN
  Administered 2013-07-05: 30 mg via INTRAMUSCULAR

## 2013-07-05 MED ORDER — SODIUM CHLORIDE 0.9 % IJ SOLN: 3.0000 mL | INTRAMUSCULAR | Status: DC | PRN

## 2013-07-05 MED ORDER — SCOPOLAMINE 1 MG/3DAYS TD PT72
MEDICATED_PATCH | TRANSDERMAL | Status: AC
  Filled 2013-07-05: qty 1

## 2013-07-05 MED ORDER — SIMETHICONE 80 MG PO CHEW
80.0000 mg | CHEWABLE_TABLET | Freq: Three times a day (TID) | ORAL | Status: DC
  Administered 2013-07-06 – 2013-07-08 (×4): 80 mg via ORAL
  Filled 2013-07-05 (×6): qty 1

## 2013-07-05 MED ORDER — ONDANSETRON HCL 4 MG/2ML IJ SOLN
INTRAMUSCULAR | Status: AC
  Filled 2013-07-05: qty 2

## 2013-07-05 MED ORDER — NALBUPHINE HCL 10 MG/ML IJ SOLN
5.0000 mg | INTRAMUSCULAR | Status: DC | PRN
  Administered 2013-07-05: 10 mg via SUBCUTANEOUS
  Administered 2013-07-06: 5 mg via SUBCUTANEOUS

## 2013-07-05 MED ORDER — SIMETHICONE 80 MG PO CHEW
80.0000 mg | CHEWABLE_TABLET | ORAL | Status: DC | PRN
  Administered 2013-07-07: 80 mg via ORAL

## 2013-07-05 MED ORDER — SIMETHICONE 80 MG PO CHEW
80.0000 mg | CHEWABLE_TABLET | ORAL | Status: DC
Start: 2013-07-06 — End: 2013-07-08
  Administered 2013-07-05 – 2013-07-07 (×3): 80 mg via ORAL
  Filled 2013-07-05 (×3): qty 1

## 2013-07-05 MED ORDER — LIDOCAINE HCL (PF) 1 % IJ SOLN
INTRAMUSCULAR | Status: AC
  Filled 2013-07-05: qty 5

## 2013-07-05 MED ORDER — INSULIN ASPART 100 UNIT/ML ~~LOC~~ SOLN
4.0000 [IU] | Freq: Three times a day (TID) | SUBCUTANEOUS | Status: DC
  Filled 2013-07-05: qty 0.04

## 2013-07-05 MED ORDER — ZOLPIDEM TARTRATE 5 MG PO TABS: 5.0000 mg | ORAL_TABLET | Freq: Every evening | ORAL | Status: DC | PRN

## 2013-07-05 MED ORDER — BUPIVACAINE HCL (PF) 0.5 % IJ SOLN
INTRAMUSCULAR | Status: DC | PRN
  Administered 2013-07-05: 30 mL

## 2013-07-05 MED ORDER — KETOROLAC TROMETHAMINE 30 MG/ML IJ SOLN: 30.0000 mg | Freq: Four times a day (QID) | INTRAMUSCULAR | Status: AC | PRN

## 2013-07-05 MED ORDER — OXYTOCIN 40 UNITS IN LACTATED RINGERS INFUSION - SIMPLE MED: 62.5000 mL/h | INTRAVENOUS | Status: AC

## 2013-07-05 MED ORDER — SCOPOLAMINE 1 MG/3DAYS TD PT72
1.0000 | MEDICATED_PATCH | Freq: Once | TRANSDERMAL | Status: AC
  Administered 2013-07-05: 1.5 mg via TRANSDERMAL

## 2013-07-05 MED ORDER — PHENYLEPHRINE 40 MCG/ML (10ML) SYRINGE FOR IV PUSH (FOR BLOOD PRESSURE SUPPORT)
PREFILLED_SYRINGE | INTRAVENOUS | Status: AC
  Filled 2013-07-05: qty 5

## 2013-07-05 MED ORDER — IBUPROFEN 600 MG PO TABS
600.0000 mg | ORAL_TABLET | Freq: Four times a day (QID) | ORAL | Status: DC
  Administered 2013-07-05 – 2013-07-08 (×11): 600 mg via ORAL
  Filled 2013-07-05 (×12): qty 1

## 2013-07-05 MED ORDER — DIPHENHYDRAMINE HCL 50 MG/ML IJ SOLN: 25.0000 mg | INTRAMUSCULAR | Status: DC | PRN

## 2013-07-05 MED ORDER — ONDANSETRON HCL 4 MG/2ML IJ SOLN: 4.0000 mg | INTRAMUSCULAR | Status: DC | PRN

## 2013-07-05 MED ORDER — TETANUS-DIPHTH-ACELL PERTUSSIS 5-2.5-18.5 LF-MCG/0.5 IM SUSP: 0.5000 mL | Freq: Once | INTRAMUSCULAR | Status: DC

## 2013-07-05 MED ORDER — MIDAZOLAM HCL 2 MG/2ML IJ SOLN: 0.5000 mg | Freq: Once | INTRAMUSCULAR | Status: DC | PRN

## 2013-07-05 MED ORDER — INSULIN NPH (HUMAN) (ISOPHANE) 100 UNIT/ML ~~LOC~~ SUSP
8.0000 [IU] | Freq: Two times a day (BID) | SUBCUTANEOUS | Status: DC
  Filled 2013-07-05: qty 10

## 2013-07-05 MED ORDER — OXYTOCIN 40 UNITS IN LACTATED RINGERS INFUSION - SIMPLE MED
INTRAVENOUS | Status: DC | PRN
  Administered 2013-07-05: 40 [IU] via INTRAVENOUS

## 2013-07-05 MED ORDER — PHENYLEPHRINE 8 MG IN D5W 100 ML (0.08MG/ML) PREMIX OPTIME
INJECTION | INTRAVENOUS | Status: AC
  Filled 2013-07-05: qty 100

## 2013-07-05 MED ORDER — NALBUPHINE HCL 10 MG/ML IJ SOLN
5.0000 mg | INTRAMUSCULAR | Status: DC | PRN
Start: 2013-07-05 — End: 2013-07-08
  Filled 2013-07-05: qty 1

## 2013-07-05 MED ORDER — LANOLIN HYDROUS EX OINT: 1.0000 "application " | TOPICAL_OINTMENT | CUTANEOUS | Status: DC | PRN

## 2013-07-05 MED ORDER — NALBUPHINE HCL 10 MG/ML IJ SOLN
INTRAMUSCULAR | Status: AC
  Administered 2013-07-05: 10 mg via SUBCUTANEOUS
  Filled 2013-07-05: qty 1

## 2013-07-05 MED ORDER — MEPERIDINE HCL 25 MG/ML IJ SOLN: 6.2500 mg | INTRAMUSCULAR | Status: DC | PRN

## 2013-07-05 MED ORDER — INSULIN ASPART 100 UNIT/ML ~~LOC~~ SOLN
0.0000 [IU] | Freq: Three times a day (TID) | SUBCUTANEOUS | Status: DC
  Administered 2013-07-05: 5 [IU] via SUBCUTANEOUS
  Administered 2013-07-06: 8.9 [IU] via SUBCUTANEOUS
  Administered 2013-07-06: 5 [IU] via SUBCUTANEOUS
  Administered 2013-07-07: 2 [IU] via SUBCUTANEOUS
  Administered 2013-07-07: 1 [IU] via SUBCUTANEOUS

## 2013-07-05 MED ORDER — INSULIN NPH (HUMAN) (ISOPHANE) 100 UNIT/ML ~~LOC~~ SUSP: 6.0000 [IU] | Freq: Two times a day (BID) | SUBCUTANEOUS | Status: DC

## 2013-07-05 MED ORDER — ONDANSETRON HCL 4 MG PO TABS: 4.0000 mg | ORAL_TABLET | ORAL | Status: DC | PRN

## 2013-07-05 MED ORDER — FAMOTIDINE IN NACL 20-0.9 MG/50ML-% IV SOLN
20.0000 mg | Freq: Once | INTRAVENOUS | Status: AC
  Administered 2013-07-05 (×2): 20 mg via INTRAVENOUS
  Filled 2013-07-05: qty 50

## 2013-07-05 MED ORDER — LACTATED RINGERS IV SOLN
INTRAVENOUS | Status: DC
  Administered 2013-07-06: 02:00:00 via INTRAVENOUS

## 2013-07-05 MED ORDER — WITCH HAZEL-GLYCERIN EX PADS: 1.0000 "application " | MEDICATED_PAD | CUTANEOUS | Status: DC | PRN

## 2013-07-05 MED ORDER — BUPIVACAINE HCL (PF) 0.5 % IJ SOLN
INTRAMUSCULAR | Status: AC
  Filled 2013-07-05: qty 30

## 2013-07-05 MED ORDER — TERBUTALINE SULFATE 1 MG/ML IJ SOLN
0.2500 mg | Freq: Once | INTRAMUSCULAR | Status: AC
  Administered 2013-07-05: 0.25 mg via SUBCUTANEOUS
  Filled 2013-07-05: qty 1

## 2013-07-05 MED ORDER — METOCLOPRAMIDE HCL 5 MG/ML IJ SOLN: 10.0000 mg | Freq: Three times a day (TID) | INTRAMUSCULAR | Status: DC | PRN

## 2013-07-05 MED ORDER — DIPHENHYDRAMINE HCL 50 MG/ML IJ SOLN: 12.5000 mg | INTRAMUSCULAR | Status: DC | PRN

## 2013-07-05 MED ORDER — CITRIC ACID-SODIUM CITRATE 334-500 MG/5ML PO SOLN
30.0000 mL | Freq: Once | ORAL | Status: AC
  Administered 2013-07-05: 30 mL via ORAL
  Filled 2013-07-05: qty 15

## 2013-07-05 MED ORDER — INSULIN ASPART 100 UNIT/ML ~~LOC~~ SOLN
SUBCUTANEOUS | Status: AC
  Administered 2013-07-05: 4 [IU] via SUBCUTANEOUS
  Filled 2013-07-05: qty 1

## 2013-07-05 MED ORDER — OXYTOCIN 10 UNIT/ML IJ SOLN
INTRAMUSCULAR | Status: AC
  Filled 2013-07-05: qty 4

## 2013-07-05 MED ORDER — DIBUCAINE 1 % RE OINT: 1.0000 "application " | TOPICAL_OINTMENT | RECTAL | Status: DC | PRN

## 2013-07-05 MED ORDER — PHENYLEPHRINE 8 MG IN D5W 100 ML (0.08MG/ML) PREMIX OPTIME
INJECTION | INTRAVENOUS | Status: DC | PRN
  Administered 2013-07-05: 60 ug/min via INTRAVENOUS

## 2013-07-05 MED ORDER — MEPERIDINE HCL 25 MG/ML IJ SOLN
6.2500 mg | INTRAMUSCULAR | Status: DC | PRN
Start: 2013-07-05 — End: 2013-07-05

## 2013-07-05 MED ORDER — INSULIN ASPART 100 UNIT/ML ~~LOC~~ SOLN
4.0000 [IU] | Freq: Once | SUBCUTANEOUS | Status: AC
  Administered 2013-07-05: 4 [IU] via SUBCUTANEOUS

## 2013-07-05 MED ORDER — NALOXONE HCL 0.4 MG/ML IJ SOLN: 0.4000 mg | INTRAMUSCULAR | Status: DC | PRN

## 2013-07-05 MED ORDER — INSULIN ASPART PROT & ASPART (70-30 MIX) 100 UNIT/ML ~~LOC~~ SUSP: 4.0000 [IU] | Freq: Once | SUBCUTANEOUS | Status: DC

## 2013-07-05 MED ORDER — ACETAMINOPHEN 500 MG PO TABS
1000.0000 mg | ORAL_TABLET | Freq: Four times a day (QID) | ORAL | Status: AC
  Administered 2013-07-05 – 2013-07-06 (×2): 1000 mg via ORAL
  Filled 2013-07-05 (×2): qty 2

## 2013-07-05 MED ORDER — CEFAZOLIN SODIUM-DEXTROSE 2-3 GM-% IV SOLR
2.0000 g | INTRAVENOUS | Status: AC
  Administered 2013-07-05: 2 g via INTRAVENOUS
  Filled 2013-07-05: qty 50

## 2013-07-05 MED ORDER — FENTANYL CITRATE 0.05 MG/ML IJ SOLN: 25.0000 ug | INTRAMUSCULAR | Status: DC | PRN

## 2013-07-05 MED ORDER — DIPHENHYDRAMINE HCL 25 MG PO CAPS
25.0000 mg | ORAL_CAPSULE | Freq: Four times a day (QID) | ORAL | Status: DC | PRN
  Administered 2013-07-06 (×2): 25 mg via ORAL
  Filled 2013-07-05 (×2): qty 1

## 2013-07-05 SURGICAL SUPPLY — 41 items
APL SKNCLS STERI-STRIP NONHPOA (GAUZE/BANDAGES/DRESSINGS) ×1
BARRIER ADHS 3X4 INTERCEED (GAUZE/BANDAGES/DRESSINGS) IMPLANT
BENZOIN TINCTURE PRP APPL 2/3 (GAUZE/BANDAGES/DRESSINGS) ×2 IMPLANT
BRR ADH 4X3 ABS CNTRL BYND (GAUZE/BANDAGES/DRESSINGS)
CLAMP CORD UMBIL (MISCELLANEOUS) IMPLANT
CLOSURE WOUND 1/2 X4 (GAUZE/BANDAGES/DRESSINGS) ×1
CLOTH BEACON ORANGE TIMEOUT ST (SAFETY) ×3 IMPLANT
CONTAINER PREFILL 10% NBF 15ML (MISCELLANEOUS) IMPLANT
DRAPE LG THREE QUARTER DISP (DRAPES) IMPLANT
DRSG OPSITE POSTOP 4X10 (GAUZE/BANDAGES/DRESSINGS) ×3 IMPLANT
DURAPREP 26ML APPLICATOR (WOUND CARE) ×3 IMPLANT
ELECT REM PT RETURN 9FT ADLT (ELECTROSURGICAL) ×3
ELECTRODE REM PT RTRN 9FT ADLT (ELECTROSURGICAL) ×1 IMPLANT
EXTRACTOR VACUUM KIWI (MISCELLANEOUS) IMPLANT
GLOVE BIO SURGEON STRL SZ 6.5 (GLOVE) ×2 IMPLANT
GLOVE BIO SURGEONS STRL SZ 6.5 (GLOVE) ×1
GOWN STRL REUS W/TWL LRG LVL3 (GOWN DISPOSABLE) ×6 IMPLANT
KIT ABG SYR 3ML LUER SLIP (SYRINGE) IMPLANT
NDL HYPO 25X5/8 SAFETYGLIDE (NEEDLE) IMPLANT
NDL SPNL 18GX3.5 QUINCKE PK (NEEDLE) ×1 IMPLANT
NEEDLE HYPO 25X5/8 SAFETYGLIDE (NEEDLE) IMPLANT
NEEDLE SPNL 18GX3.5 QUINCKE PK (NEEDLE) ×3 IMPLANT
NS IRRIG 1000ML POUR BTL (IV SOLUTION) ×3 IMPLANT
PACK C SECTION WH (CUSTOM PROCEDURE TRAY) ×3 IMPLANT
PAD OB MATERNITY 4.3X12.25 (PERSONAL CARE ITEMS) ×3 IMPLANT
STRIP CLOSURE SKIN 1/2X4 (GAUZE/BANDAGES/DRESSINGS) ×1 IMPLANT
SUT PDS AB 0 CTX 60 (SUTURE) IMPLANT
SUT VIC AB 0 CT1 27 (SUTURE)
SUT VIC AB 0 CT1 27XBRD ANBCTR (SUTURE) IMPLANT
SUT VIC AB 0 CT1 36 (SUTURE) IMPLANT
SUT VIC AB 2-0 CT1 27 (SUTURE) ×3
SUT VIC AB 2-0 CT1 TAPERPNT 27 (SUTURE) ×1 IMPLANT
SUT VIC AB 2-0 CTX 36 (SUTURE) ×6 IMPLANT
SUT VIC AB 3-0 CT1 27 (SUTURE) ×3
SUT VIC AB 3-0 CT1 TAPERPNT 27 (SUTURE) ×1 IMPLANT
SUT VIC AB 3-0 SH 27 (SUTURE)
SUT VIC AB 3-0 SH 27X BRD (SUTURE) IMPLANT
SYR 30ML LL (SYRINGE) ×3 IMPLANT
TOWEL OR 17X24 6PK STRL BLUE (TOWEL DISPOSABLE) ×3 IMPLANT
TRAY FOLEY CATH 14FR (SET/KITS/TRAYS/PACK) ×3 IMPLANT
WATER STERILE IRR 1000ML POUR (IV SOLUTION) ×1 IMPLANT

## 2013-07-05 NOTE — Anesthesia Preprocedure Evaluation (Signed)
Anesthesia Evaluation  Patient identified by MRN, date of birth, ID band Patient awake    Reviewed: Allergy & Precautions, H&P , NPO status , Patient's Chart, lab work & pertinent test results  Airway Mallampati: II      Dental   Pulmonary former smoker,  breath sounds clear to auscultation        Cardiovascular Exercise Tolerance: Good Rhythm:regular Rate:Normal     Neuro/Psych  Neuromuscular disease    GI/Hepatic   Endo/Other  diabetes  Renal/GU      Musculoskeletal   Abdominal   Peds  Hematology   Anesthesia Other Findings   Reproductive/Obstetrics (+) Pregnancy                           Anesthesia Physical Anesthesia Plan  ASA: III and emergent  Anesthesia Plan: Spinal   Post-op Pain Management:    Induction:   Airway Management Planned:   Additional Equipment:   Intra-op Plan:   Post-operative Plan:   Informed Consent: I have reviewed the patients History and Physical, chart, labs and discussed the procedure including the risks, benefits and alternatives for the proposed anesthesia with the patient or authorized representative who has indicated his/her understanding and acceptance.     Plan Discussed with: Anesthesiologist, CRNA and Surgeon  Anesthesia Plan Comments:         Anesthesia Quick Evaluation

## 2013-07-05 NOTE — Progress Notes (Signed)
It would be in the best interest of the patient to proceed with the repeat cesearan section even if the type and screen is not yet available.

## 2013-07-05 NOTE — MAU Note (Signed)
Patient states she is having contractions every 6-7 minutes. Denies bleeding or leaking. Reports good fetal movement. Patient states she is scheduled for a repeat cesarean section on 3-2.

## 2013-07-05 NOTE — Transfer of Care (Signed)
Immediate Anesthesia Transfer of Care Note  Patient: Ann Robertson  Procedure(s) Performed: Procedure(s): CESAREAN SECTION (N/A)  Patient Location: PACU  Anesthesia Type:Spinal  Level of Consciousness: awake, alert  and oriented  Airway & Oxygen Therapy: Patient Spontanous Breathing  Post-op Assessment: Report given to PACU RN and Post -op Vital signs reviewed and stable  Post vital signs: Reviewed and stable  Complications: No apparent anesthesia complications

## 2013-07-05 NOTE — Progress Notes (Signed)
P=101  Pt reports pressure since last pm, is not sure if they are contractions.

## 2013-07-05 NOTE — Lactation Note (Signed)
This note was copied from the chart of [place]. Lactation Consultation Note Initial visit at 6 hours of age. 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  Mom breast fed baby a few times prior to admit.  Mom is anxious with itching, wanting to see baby and being tired and hungry.  Offered emotional support.  Discussed need to use DEBP, set up and began pumping.  Mom to pump every 3 hours for 15 minutes.  NICU LC to follow tomorrow.  Mom is currently on [facility].   Patient Name: Ann Robertson [id number] Date: 07/05/2013 Reason for consult: Initial assessment   Maternal Data Formula Feeding for Exclusion: Yes Reason for exclusion: Mother's choice to formula and breast feed on admission Has patient been taught Hand Expression?: Yes Does the patient have breastfeeding experience prior to this delivery?: Yes  Feeding Feeding Type: Formula Length of feed: 10 min  LATCH Score/Interventions Latch: Repeated attempts needed to sustain latch, nipple held in mouth throughout feeding, stimulation needed to elicit sucking reflex. Intervention(s): Adjust position;Assist with latch;Breast massage;Breast compression  Audible Swallowing: None Intervention(s): Skin to skin;Hand expression  Type of Nipple: Everted at rest and after stimulation  Comfort (Breast/Nipple): Soft / non-tender     Hold (Positioning): No assistance needed to correctly position infant at breast. Intervention(s): Breastfeeding basics reviewed;Support Pillows;Position options;Skin to skin  LATCH Score: 7  Lactation Tools Discussed/Used Pump Review: Setup, frequency, and cleaning Initiated by:: js Date initiated:: 07/06/13   Consult Status Consult Status: Follow-up Date: 07/06/13 Follow-up type: In-patient    Ann Robertson, Justine Null 07/05/2013, 9:45 PM

## 2013-07-05 NOTE — H&P (Signed)
Ann Robertson is a 23 y.o. female [phone] at [redacted]w[redacted]d presenting for Regular painful Uterine contractions that started last night. Since that time pt reports they have become more intense and closer together. Pt had an emergency c-section with her first child and desires repeat. On exam this morning she was closed, and on repeat she was dialated to 2 cm.   Normal fetal movement, no LOF, no vb.  Maternal Medical History:  Reason for admission: Contractions.   Contractions: Frequency: regular.   Duration is approximately 50 seconds.   Perceived severity is strong.    Fetal activity: Perceived fetal activity is normal.    Prenatal complications: Preterm labor.     OB History   Grav Para Term Preterm Abortions TAB SAB Ect Mult Living   3 1 0 1 1 0 1 0 0 1      Past Medical History  Diagnosis Date  . Diabetes mellitus   . Juvenile diabetes mellitus   . Juvenile diabetes 2000  . Neuropathy   . High cholesterol   . HSV-2 infection   . STD (sexually transmitted disease)    Past Surgical History  Procedure Laterality Date  . Cesarean section    . Wisdom tooth extraction     Family History: family history includes Diabetes in her father; Heart disease in her mother; Hyperlipidemia in her father; Hypertension in her mother; Kidney disease in her mother; Stroke in her mother. Social History:  reports that she has quit smoking. She does not have any smokeless tobacco history on file. She reports that she does not drink alcohol or use illicit drugs.   Prenatal Transfer Tool  Maternal Diabetes: Yes:  Diabetes Type:  Pre-pregnancy, Insulin/Medication controlled Genetic Screening: Normal Maternal Ultrasounds/Referrals: Normal Fetal Ultrasounds or other Referrals:  None Maternal Substance Abuse:  No Significant Maternal Medications:  None Significant Maternal Lab Results:  None Other Comments:  Preterm labor  Review of Systems  Constitutional: Negative.   Eyes: Negative.    Respiratory: Negative.   Cardiovascular: Negative.   Gastrointestinal: Positive for abdominal pain.  Genitourinary: Negative.     Blood pressure 139/91, pulse 79, temperature 98.3 F (36.8 C), temperature source Oral, resp. rate 20, height 5\' 5"  (1.651 m), weight 74.39 kg (164 lb). Maternal Exam:  Uterine Assessment: Contraction strength is moderate.  Contraction duration is 50 seconds. Contraction frequency is regular.   Abdomen: not evaluated.  Introitus: not evaluated.     Physical Exam  Constitutional: She appears well-developed and well-nourished.  Cardiovascular: Normal heart sounds.   Respiratory: Breath sounds normal.  Gravid, leopold 2800g No c/c/e   Dilation: 2.5 Effacement (%): 80 Station: -3 Exam by:: Dr. Sherril Cong  FHT: 150s, mod varibility, multiple accels, decels x2 early placed. Toco q2-60min  Prenatal labs: ABO, Rh: --/--/O POS (12/20 1835) Antibody: NEG (12/20 1835) Rubella: 1.62 (08/11 1013) RPR: NON REAC (12/08 1120)  HBsAg: NEGATIVE (08/11 1013)  HIV: NON REACTIVE (12/08 1120)  GBS:      Assessment/Plan: Ann Robertson is a 23 y.o. [id number] at [redacted]w[redacted]d admitted for preterm labor, pt is for a repeat Caesarean Section #Labor:Pt is in early labor with cervical change, pt is >34 wks, and desires a repeat c-section. Reviewed risks and benefits of RLTCS and pt desires. Pt consented. Will give 2g ancef, and move to section. #FWB: Cat II, 8/8 BPP today, 83%tile - 2942g #MOF: Breast #MOC: Condoms #Diabetes: Poorly controlled-  current regimen with SSI with novolin 12u bid. Will continue after section. FSG now.  This procedure has been fully reviewed with the patient and written informed consent has been obtained.  Fredrik Rigger, MD OB Fellow

## 2013-07-05 NOTE — Progress Notes (Signed)
Inpatient Diabetes Program Recommendations  AACE/ADA: New Consensus Statement on Inpatient Glycemic Control (2013)  Target Ranges:  Prepandial:   less than 140 mg/dL      Peak postprandial:   less than 180 mg/dL (1-2 hours)      Critically ill patients:  140 - 180 mg/dL   Reason for Assessment: Received a page from [employer], RN, to review glycemic control orders  Diabetes history: Type 1 Outpatient Diabetes medications: NPH 10-12 units bid and Humalog 2-14 units tidwc and hs for correction Current orders for Inpatient glycemic control: NPH 6 units bid and Novolog 2-14 units tidwc  Paged at 8 pm tonight regarding 23 year old Type 1 DM [redacted]w[redacted]d weeks gestation s/p c-section. CBG of 301 at 1610 in PACU and given 4 units Novolog. At approx 2000 pt c/o feeling faint and felt like her blood sugar was dropping. Per RN, blood sugar was checked (209 mg/dL) and pt was given orange juice.  Immediately pt felt better. MD has ordered Diabetic Pregnant Patient correction tidwc. Recommend correction insulin be changed to the Glycemic Control order set for Non-Pregnant Adults  Inpatient Diabetes Program Recommendations Insulin - Basal: NPH 6 units bid Correction (SSI): Novolog sensitive tidwc and hs Insulin - Meal Coverage: Add Novolog 4 units tidwc if pt eats >50% meal Diet: When diet advanced, CHO mod medium  Note: Only 1 CBG in CHL which is 301 mg/dL. Requested RN to dock meter to have accurate blood sugars in doc flowsheets. RN to call Teaching Service MD with above recommendations.  Will f/u in am. Thank you. Lorenda Peck, RD, LDN, CDE Inpatient Diabetes Coordinator [phone]

## 2013-07-05 NOTE — Progress Notes (Signed)
Variable decels down to 40-60 after spinal in OR.  Strip viewed by Dr. Hulan Fray.

## 2013-07-05 NOTE — Op Note (Signed)
Ann Robertson PROCEDURE DATE: 07/05/2013  PREOPERATIVE DIAGNOSES: Intrauterine pregnancy at  [redacted]w[redacted]d weeks gestation; previous uterine incision kronig with preterm labor  POSTOPERATIVE DIAGNOSES: The same  PROCEDURE: Repeat Low Transverse Cesarean Section  SURGEON:  Dr. Hulan Fray  ASSISTANT:  Dr. Leslie Andrea  INDICATIONS: Ann Robertson is a 23 y.o. [phone] at [redacted]w[redacted]d here for cesarean section secondary to the indications listed under preoperative diagnoses; please see preoperative note for further details.  The risks of cesarean section were discussed with the patient including but were not limited to: bleeding which may require transfusion or reoperation; infection which may require antibiotics; injury to bowel, bladder, ureters or other surrounding organs; injury to the fetus; need for additional procedures including hysterectomy in the event of a life-threatening hemorrhage; placental abnormalities wth subsequent pregnancies, incisional problems, thromboembolic phenomenon and other postoperative/anesthesia complications.   The patient concurred with the proposed plan, giving informed written consent for the procedure.    FINDINGS:  Viable female infant in cephalic presentation.  Apgars 8 and 9.  Clear amniotic fluid.  Intact placenta, three vessel cord.  Normal uterus, fallopian tubes and ovaries bilaterally.  ANESTHESIA: Spinal INTRAVENOUS FLUIDS: 1000 ml ESTIMATED BLOOD LOSS: 700 ml URINE OUTPUT:  100 ml SPECIMENS: Placenta sent to pathology COMPLICATIONS: None immediate  PROCEDURE IN DETAIL:  The patient preoperatively received intravenous antibiotics and had sequential compression devices applied to her lower extremities.  She was then taken to the operating room where spinal anesthesia was administered and was found to be adequate. She was then placed in a dorsal supine position with a leftward tilt, and prepped and draped in a sterile manner.  A foley catheter was placed into her bladder and  attached to constant gravity.  After an adequate timeout was performed, 30 ml of 0.5% Marcaine was injected subcutaneously around the prior scar, and a Pfannenstiel skin incision was made with scalpel and carried through to the underlying layer of fascia. The fascia was incised in the midline, and this incision was extended bilaterally using the Mayo scissors.  The rectus muscles were transected with bovie and the peritoneum was entered bluntly. Attention was turned to the lower uterine segment where a low transverse hysterotomy was made with a scalpel and extended bilaterally bluntly.  The infant was successfully delivered, the cord was clamped and cut and the infant was handed over to awaiting neonatology team. Uterine massage was then administered, and the placenta delivered intact with a three-vessel cord. The uterus was then cleared of clot and debris.  The hysterotomy was closed with 0 Vicryl in a running locked fashion, and an imbricating layer was also placed with 0 Vicryl. The pelvis was cleared of all clot and debris. Hemostasis was confirmed on all surfaces. Adenexa evaluated and appeared normal. The fascia was then closed using 0 PDS in a running fashion. The subcutaneous layer was irrigated. The skin was closed with a 4-0 Vicryl subcuticular stitch. The patient tolerated the procedure well. Sponge, lap, instrument and needle counts were correct x 2.  She was taken to the recovery room in stable condition.   Fredrik Rigger, MD OB Fellow

## 2013-07-05 NOTE — Progress Notes (Signed)
Came to see pt regarding visitation issue, she is quite pale and says "I feel faint". Very pale CBG = 209. Very shaky. Oriented x 4. Within 5-7 mins of sipping juice, pt's skin color improved and she is more appropriate in affect. Had been falling asleep during talk. Dr. (resident at [phone]) called.

## 2013-07-05 NOTE — Anesthesia Postprocedure Evaluation (Signed)
  Anesthesia Post Note  Patient: Ann Robertson  Procedure(s) Performed: Procedure(s) (LRB): CESAREAN SECTION (N/A)  Anesthesia type: Spinal  Patient location: PACU  Post pain: Pain level controlled  Post assessment: Post-op Vital signs reviewed  Last Vitals:  Filed Vitals:   07/05/13 1227  BP: 139/91  Pulse: 79  Temp: 36.8 C  Resp: 20    Post vital signs: Reviewed  Level of consciousness: awake  Complications: No apparent anesthesia complications

## 2013-07-05 NOTE — Anesthesia Procedure Notes (Signed)
Spinal  Patient location during procedure: OR Start time: 07/05/2013 3:05 PM Staffing Anesthesiologist: Rudean Curt Performed by: anesthesiologist  Preanesthetic Checklist Completed: patient identified, site marked, surgical consent, pre-op evaluation, timeout performed, IV checked, risks and benefits discussed and monitors and equipment checked Spinal Block Patient position: sitting Prep: DuraPrep Patient monitoring: heart rate, cardiac monitor, continuous pulse ox and blood pressure Approach: midline Location: L3-4 Injection technique: single-shot Needle Needle type: Sprotte  Needle gauge: 24 G Needle length: 9 cm Assessment Sensory level: T4 Additional Notes Patient identified.  Risk benefits discussed including failed block, incomplete pain control, headache, nerve damage, paralysis, blood pressure changes, nausea, vomiting, reactions to medication both toxic or allergic, and postpartum back pain.  Patient expressed understanding and wished to proceed.  All questions were answered.  Sterile technique used throughout procedure.  CSF was clear.  No parasthesia or other complications.  Please see nursing notes for vital signs.

## 2013-07-06 ENCOUNTER — Encounter (HOSPITAL_COMMUNITY): Payer: Self-pay | Admitting: Obstetrics & Gynecology

## 2013-07-06 LAB — CBC
HEMATOCRIT: 30.1 % — AB (ref 36.0–46.0)
Hemoglobin: 10.6 g/dL — ABNORMAL LOW (ref 12.0–15.0)
MCH: 31.4 pg (ref 26.0–34.0)
MCHC: 35.2 g/dL (ref 30.0–36.0)
MCV: 89.1 fL (ref 78.0–100.0)
Platelets: 156 10*3/uL (ref 150–400)
RBC: 3.38 MIL/uL — ABNORMAL LOW (ref 3.87–5.11)
RDW: 13 % (ref 11.5–15.5)
WBC: 11.8 10*3/uL — ABNORMAL HIGH (ref 4.0–10.5)

## 2013-07-06 LAB — GLUCOSE, CAPILLARY
GLUCOSE-CAPILLARY: 275 mg/dL — AB (ref 70–99)
GLUCOSE-CAPILLARY: 105 mg/dL — AB (ref 70–99)
GLUCOSE-CAPILLARY: 112 mg/dL — AB (ref 70–99)
Glucose-Capillary: 209 mg/dL — ABNORMAL HIGH (ref 70–99)
Glucose-Capillary: 69 mg/dL — ABNORMAL LOW (ref 70–99)
Glucose-Capillary: 61 mg/dL — ABNORMAL LOW (ref 70–99)
Glucose-Capillary: 71 mg/dL (ref 70–99)

## 2013-07-06 MED ORDER — INSULIN ASPART 100 UNIT/ML ~~LOC~~ SOLN
1.0000 [IU] | Freq: Three times a day (TID) | SUBCUTANEOUS | Status: DC
  Administered 2013-07-06: 7 [IU] via SUBCUTANEOUS
  Administered 2013-07-07: 5 [IU] via SUBCUTANEOUS

## 2013-07-06 MED ORDER — INSULIN NPH (HUMAN) (ISOPHANE) 100 UNIT/ML ~~LOC~~ SUSP
12.0000 [IU] | Freq: Two times a day (BID) | SUBCUTANEOUS | Status: DC
  Administered 2013-07-06: 12 [IU] via SUBCUTANEOUS
  Filled 2013-07-06: qty 10

## 2013-07-06 MED ORDER — INSULIN GLARGINE 100 UNIT/ML ~~LOC~~ SOLN
20.0000 [IU] | Freq: Every day | SUBCUTANEOUS | Status: DC
  Administered 2013-07-06 – 2013-07-07 (×2): 20 [IU] via SUBCUTANEOUS
  Filled 2013-07-06 (×3): qty 0.2

## 2013-07-06 NOTE — Progress Notes (Signed)
Spoke with diabetic coordinator regarding Pt insulin orders. Notified MD on recommendations for new insulin orders. MD to follow up. MD also notified about patient honeycomb dressing being saturated with minimum to moderate amount of new drainage. Drainage over Marked areas x3. MD to come see patient with plan to do a pressure dressing.

## 2013-07-06 NOTE — Progress Notes (Signed)
Reviewed diabetic nurse note. Will change tx to lanuts 20 qhs with 1u/10g carbs with SSI. Suspect this will be inadequate and may require adjustment as patient equilibrates to PP insulin needs. Agree with Diabetic education PP. Fredrik Rigger, MD OB Fellow

## 2013-07-06 NOTE — Progress Notes (Signed)
Inpatient Diabetes Program Recommendations  AACE/ADA: New Consensus Statement on Inpatient Glycemic Control (2013)  Target Ranges:  Prepandial:   less than 140 mg/dL      Peak postprandial:   less than 180 mg/dL (1-2 hours)      Critically ill patients:  140 - 180 mg/dL   Reason for Assessment: Type 1 DM   Spoke to pt briefly on phone regarding her home meds prior to pregnancy. States she was taking Lantus 22 units QHS and Humalog S/S. States blood sugars usually ran between 300-400s and occasionally in the 500s. Did state she also had some lows at night.   Results for ARNECIA, COLONE (MRN [id number]) as of 07/06/2013 12:10  Ref. Range 07/05/2013 16:10 07/05/2013 19:43 07/05/2013 22:08 07/06/2013 08:35  Glucose-Capillary Latest Range: 70-99 mg/dL 301 (H) 209 (H) 259 (H) 275 (H)  Results for GALYN, SCHULTZE (MRN [id number]) as of 07/06/2013 12:10  Ref. Range 05/07/2013 11:20  Hemoglobin A1C Latest Range: <5.7 % 8.7 (H)   Needs continued diabetes education regarding importance of keeping blood sugars <180 mg/dL and improving HgbA1C to prevent long-term complications. At this point, I would recommend the following:  D/C NPH and begin Lantus 20 units QHS. While inpatient, Novolog sensitive tidwc and hs. For CHO coverage - Novolog 1 unit for every 10 grams of CHO. Strongly recommend OP Diabetes Education for uncontrolled blood sugars.  Thank you. Lorenda Peck, RD, LDN, CDE Inpatient Diabetes Coordinator [phone]

## 2013-07-06 NOTE — Progress Notes (Signed)
Pt ate 75% of dinner and 1/2 cup apple juice, CBG 15 min recheck done with result of 71.

## 2013-07-06 NOTE — Progress Notes (Signed)
Pt checked Blood sugar with her own machine and result was 75. Pt already eating before I could check with our machine. Result was 69. Pt wasn't having any symptoms at the time and stated " It don't feel low". Notified Diabetic coordinator and was told not to give dinner sliding scale nor carb coverage but ok to give Lantus at HS. Angela Adam the Resident on call notified also and agreed with Diabetic coordinator suggestion. Also want to recheck Blood sugar with in 15-30 minutes. Pt encouraged to continue eating and also given apple juice. Will recheck blood sugar. No need to activate hypoglycemic protocol at this time unless next blood sugar is still less than 70 per Angela Adam the Resident on call.

## 2013-07-06 NOTE — Progress Notes (Signed)
UR completed 

## 2013-07-06 NOTE — Progress Notes (Signed)
Patient ID: Ann Robertson, female   DOB: [birth year], 23 y.o.   MRN: [id number] Subjective: Postpartum Day 1: Cesarean Delivery Pt is uncomfortable with itching, reaction to hydromorphone Hcl. Received benadryl Patient reports incisional pain, tolerating PO and + flatus.  Moderate bleeding Hasn't voided yet(just had catheter removed) No bowel movt.  Objective: Vital signs in last 24 hours: Temp:  [97.2 F (36.2 C)-98.8 F (37.1 C)] 98.1 F (36.7 C) (02/06 0625) Pulse Rate:  [76-117] 76 (02/06 0625) Resp:  [15-21] 18 (02/06 0625) BP: (78-145)/(44-91) 113/77 mmHg (02/06 0625) SpO2:  [95 %-100 %] 97 % (02/06 0625) Weight:  [74.39 kg (164 lb)-74.753 kg (164 lb 12.8 oz)] 74.39 kg (164 lb) (02/05 1227)  Physical Exam:  General: alert, cooperative and no distress Lochia: appropriate Uterine Fundus: firm Incision: no significant drainage, no significant erythema DVT Evaluation: No evidence of DVT seen on physical exam. Negative Homan's sign.   Recent Labs  07/05/13 1350 07/06/13 0646  HGB 13.8 10.6*  HCT 39.0 30.1*    Assessment/Plan: Status post Cesarean section. Doing well postoperatively.  Blood glucose levels have been elevated, Insulin dosing adjusted. Now 12NPH BID with carb counting and SSI for meals Continue current care.  Cherlyn Roberts 07/06/2013, 8:54 AM  I spoke with and examined patient and agree with medical student's note and plan of care.  Fredrik Rigger, MD OB Fellow 07/06/2013 11:50 AM

## 2013-07-06 NOTE — Lactation Note (Signed)
This note was copied from the chart of [facility]. Lactation Consultation Note    Follow up consutl with this mom of a NICU baby, 23 hours post partum. He is 35 4/7 weeks corredted gestation, with hypoglycemia. I briefly reviewed with mom how to set the premie setting, and for her to pump every 3 hours , 8 times a day, to protect her milk supply. Mom responded by saying she was trying to rest, so I did not stay to do more teaching at this time. I will follow this baby in the NICU. Mom does not have a DEP at home, so she will probably have to either rent or loan, if she is active with [facility].  Patient Name: Ann Robertson [id number] Date: 07/06/2013     Maternal Data    Feeding Feeding Type: Formula Length of feed: 30 min  LATCH Score/Interventions                      Lactation Tools Discussed/Used     Consult Status      Tonna Corner 07/06/2013, 7:50 PM

## 2013-07-06 NOTE — Progress Notes (Signed)
Recheck BP with result of 61. Pt did not eat or drink like encouraged to. Pt in restroom at time. Strongly encouraged her to eat her dinner and drink juice. Support person in room and states he will help her to eat and drink. Still no symptoms at this time. Angela Adam the Resident notified. Hypoglycemic protocol activated. Will recheck Blood sugar in 15 minutes.

## 2013-07-06 NOTE — Progress Notes (Signed)
NICU RN called to inform me that patient felt her "blood sugar was low".  Asked RN to have FOB bring patient downstairs so we could check her CBG.  Patient stated she was not leaving her baby.  Went to NICU and brought patient down to Rm 130.  CBG was 105.  Instructed patient to call for lunch while I called MD to get Insulin order.  She stated "I'm going back to my baby.  I don't care if I die, I need to make sure he is taken care of".  I reassured her that the RN's in NICU were taking great care of him and that I needed to take care of her.  Patient is very noncompliant with her food orders and counting her carbohydrates.  Will continue to monitor patient's CBG's and follow Insulin orders of Dr. Leslie Andrea. Cox, Pablo Mathurin M

## 2013-07-07 LAB — GLUCOSE, CAPILLARY
GLUCOSE-CAPILLARY: 74 mg/dL (ref 70–99)
GLUCOSE-CAPILLARY: 195 mg/dL — AB (ref 70–99)
GLUCOSE-CAPILLARY: 134 mg/dL — AB (ref 70–99)
GLUCOSE-CAPILLARY: 147 mg/dL — AB (ref 70–99)
Glucose-Capillary: 89 mg/dL (ref 70–99)
Glucose-Capillary: 48 mg/dL — ABNORMAL LOW (ref 70–99)

## 2013-07-07 MED ORDER — INSULIN ASPART 100 UNIT/ML ~~LOC~~ SOLN
1.0000 [IU] | Freq: Three times a day (TID) | SUBCUTANEOUS | Status: DC
  Administered 2013-07-07: 2 [IU] via SUBCUTANEOUS

## 2013-07-07 NOTE — Progress Notes (Signed)
Post Partum Day 2, repeat cesarean section Subjective: up ad lib, voiding, tolerating PO and + flatus.   Low blood sugar overnight, but patient reports she did not want to drink juice to correct it and has not been drinking much because it hurts to get up out of bed and she does not want to have to get up to urinate.    Denies HA, Dizziness, lightheadedness, blurry vision. VB is decreasing.   Objective: Blood pressure 146/83, pulse 73, temperature 98.2 F (36.8 C), temperature source Oral, resp. rate 18, height 5\' 5"  (1.651 m), weight 74.39 kg (164 lb), SpO2 97.00%, unknown if currently breastfeeding.  Physical Exam:  General: alert, cooperative, appears stated age and no distress Uterine Fundus: Not palpated secondary to abd distension and tenderness.  Abd: Soft, appropriately tender.  Incision: no significant drainage, no significant erythema DVT Evaluation: No evidence of DVT seen on physical exam. No cords or calf tenderness.   Recent Labs  07/05/13 1350 07/06/13 0646  HGB 13.8 10.6*  HCT 39.0 30.1*    Assessment/Plan: Plan for discharge tomorrow. Baby in NICU.  # Preterm Delivery, Repeat C-section - Doing well - Good UOP,+flatus  # DM type 1 - Hypoglycemic overnight - continue Lantus 20. d/c novolog ss and carb counting per DM RN recs. - Continue CBGs  - Recommend outpatient DM education.  # MOF - breast, pumping # MOC - none   LOS: 2 days   Wilnette Kales 07/07/2013, 7:30 AM

## 2013-07-07 NOTE — Progress Notes (Signed)
Given additional 15 carbs now due to 5 units of Novolog given. Pt. Took additional 15 carbs with her to see infant in NICU.

## 2013-07-07 NOTE — Anesthesia Postprocedure Evaluation (Signed)
Anesthesia Post Note  Patient: Ann Robertson  Procedure(s) Performed: Procedure(s) (LRB): CESAREAN SECTION (N/A)  Anesthesia type: Spinal  Patient location: Mother/Baby  Post pain: Pain level controlled  Post assessment: Post-op Vital signs reviewed  Last Vitals:  Filed Vitals:   07/07/13 0613  BP: 146/83  Pulse: 73  Temp: 36.8 C  Resp: 18    Post vital signs: Reviewed  Level of consciousness: awake  Complications: No apparent anesthesia complications

## 2013-07-07 NOTE — Progress Notes (Signed)
I have seen and examined this patient and I agree with the above. Serita Grammes 8:12 AM 07/07/2013

## 2013-07-07 NOTE — Progress Notes (Signed)
Clinical Social Work Department PSYCHOSOCIAL ASSESSMENT - MATERNAL/CHILD 07/07/2013  Patient:  Ann Robertson, Ann Robertson  Account Number:  [id number]  [place] Date:  07/05/2013  Ardine Eng Name:   Ann Robertson    Clinical Social Worker:  Kariah Loredo, LCSW   Date/Time:  07/07/2013 11:00 AM  Date Referred:  07/06/2013      Referred reason  Psychosocial assessment   Other referral source:    I:  FAMILY / [place] legal guardian:  PARENT  Guardian - Name Guardian - Age Guardian - Address  [place] [place]. Eliane Decree, [place]  Glennon Mac, [place]     Other household support members/support persons Other support:    II  PSYCHOSOCIAL DATA Information Source:    Occupational hygienist Employment:   Museum/gallery curator resources:  [employer] If [place] / Grade:   Maternity Care Coordinator / Child Services Coordination / Early Interventions:  Cultural issues impacting care:    III  STRENGTHS Strengths  Home prepared for Child (including basic supplies)   Strength comment:    IV  RISK FACTORS AND CURRENT PROBLEMS Current Problem:       V  SOCIAL WORK ASSESSMENT Acknowledged order for Social Work consult regarding mother's hx of non-compliance with managing her diabetes. Met with mother who was pleasant and receptive to social work intervention.  She is a single parents with one other dependent age 25.   Mother seems worried about having to be discharged and newborn not being able to go home with her. Mother states that she is compliant with managing her diabetes.  She has an Musician working with her in the community.  Mother spoke about how important her children are to her.   Mother notes that she does not have a lot of support at home, but feels confident in her ability to adequately provide for her children.  She denies any hx of substance abuse.  She reports hx of being treated  for depression about 3 years ago.  Informed that she took medication for a few months, but then stop taking them because she was unsure of whether it made a difference. She denies any current symptoms of depression or anxiety. Discussed signs/symptoms of PP depression.  Mother was very receptive to the information.    Provided her with information and treatment resources if needed.  She is reportedly well prepared for newborn at home.   She reports possible transportation needs but states that she is reaching out to friends and family to assist with this. Informed her of CSW availability.      VI SOCIAL WORK PLAN  Type of pt/family education:   If child protective services report - county:   If child protective services report - date:   Information/referral to community resources comment:   Other social work plan:   Pediatrician:  Reports plans to use [employer] [facility]

## 2013-07-07 NOTE — Progress Notes (Signed)
Rechecked patients CBG @ 1147. Pt. Stated she didn't consume extra carbs given to her to take in NICU. CBG 48. Given apple juice & crackers equalling 26 carbs. Recheck @ 1115- 30. Lunch was due to arrive at any time.

## 2013-07-08 LAB — GLUCOSE, CAPILLARY
GLUCOSE-CAPILLARY: 99 mg/dL (ref 70–99)
Glucose-Capillary: 68 mg/dL — ABNORMAL LOW (ref 70–99)
Glucose-Capillary: 97 mg/dL (ref 70–99)
Glucose-Capillary: 100 mg/dL — ABNORMAL HIGH (ref 70–99)
Glucose-Capillary: 81 mg/dL (ref 70–99)

## 2013-07-08 MED ORDER — OXYCODONE-ACETAMINOPHEN 5-325 MG PO TABS: 1.0000 | ORAL_TABLET | ORAL | Status: DC | PRN

## 2013-07-08 NOTE — Discharge Summary (Signed)
Obstetric Discharge Summary Reason for Admission: onset of labor and cesarean section, PTL Prenatal Procedures: none Intrapartum Procedures: cesarean: low cervical, transverse Postpartum Procedures: none Complications-Operative and Postpartum: uncontrolled blood glucose/ hypoglycemia Hemoglobin  Date Value Range Status  07/06/2013 10.6* 12.0 - 15.0 g/dL Final     REPEATED TO VERIFY     DELTA CHECK NOTED  12/21/2012 13.9   Final     HCT  Date Value Range Status  07/06/2013 30.1* 36.0 - 46.0 % Final  12/21/2012 42   Final    Physical Exam:  Filed Vitals:   07/07/13 1913  BP: 130/90  Pulse: 98  Temp: 97.9 F (36.6 C)  Resp: 18   CBG (last 3)   Recent Labs  07/08/13 0539 07/08/13 0842 07/08/13 1410  GLUCAP 100* 81 99     General: alert, cooperative, appears stated age and no distress Lochia: appropriate Uterine Fundus: firm Incision: healing well, no significant drainage, no dehiscence, no significant erythema DVT Evaluation: No evidence of DVT seen on physical exam. Negative Homan's sign. No cords or calf tenderness. No significant calf/ankle edema.  Discharge Diagnoses: Term Pregnancy-delivered, status post C-section  Discharge Information: Date: 07/08/2013 Activity: pelvic rest Diet: routine Medications: PNV and Percocet Condition: stable Instructions: refer to practice specific booklet Discharge to: home Follow-up Information   Follow up with [facility]. Schedule an appointment as soon as possible for a visit in 2 weeks.   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  FWB Category II tracing.  Patient underwent cesarean section. Baby to NICU.  Patient's blood glucose has been difficult to control inpatient and postpartum.  Currently stable. Asymptomatic and doing well.   Newborn  Data: Live born female  Birth Weight: 6 lb 7.9 oz (2945 g) APGAR: 8, 9 Baby will remain in NICU.  Wilnette Kales 07/08/2013, 9:11 AM  I examined pt and agree with documentation above and resident plan of care. [facility]

## 2013-07-08 NOTE — Discharge Instructions (Signed)
Cesarean Delivery Care After Refer to this sheet in the next few weeks. These instructions provide you with information on caring for yourself after your procedure. Your health care provider may also give you specific instructions. Your treatment has been planned according to current medical practices, but problems sometimes occur. Call your health care provider if you have any problems or questions after you go home. HOME CARE INSTRUCTIONS  Only take over-the-counter or prescription medications as directed by your health care provider.  Do not drink alcohol, especially if you are breastfeeding or taking medication to relieve pain.  Do not chew or smoke tobacco.  Continue to use good perineal care. Good perineal care includes:  Wiping your perineum from front to back.  Keeping your perineum clean.  Check your surgical cut (incision) daily for increased redness, drainage, swelling, or separation of skin.  Clean your incision gently with soap and water every day, and then pat it dry. If your health care provider says it is OK, leave the incision uncovered. Use a bandage (dressing) if the incision is draining fluid or appears irritated. If the adhesive strips across the incision do not fall off within 7 days, carefully peel them off.  Hug a pillow when coughing or sneezing until your incision is healed. This helps to relieve pain.  Do not use tampons or douche until your health care provider says it is okay.  Shower, wash your hair, and take tub baths as directed by your health care provider.  Wear a well-fitting bra that provides breast support.  Limit wearing support panties or control-top hose.  Drink enough fluids to keep your urine clear or pale yellow.  Eat high-fiber foods such as whole grain cereals and breads, brown rice, beans, and fresh fruits and vegetables every day. These foods may help prevent or relieve constipation.  Resume activities such as climbing stairs,  driving, lifting, exercising, or traveling as directed by your health care provider.  Talk to your health care provider about resuming sexual activities. This is dependent upon your risk of infection, your rate of healing, and your comfort and desire to resume sexual activity.  Try to have someone help you with your household activities and your newborn for at least a few days after you leave the hospital.  Rest as much as possible. Try to rest or take a nap when your newborn is sleeping.  Increase your activities gradually.  Keep all of your scheduled postpartum appointments. It is very important to keep your scheduled follow-up appointments. At these appointments, your health care provider will be checking to make sure that you are healing physically and emotionally. SEEK MEDICAL CARE IF:   You are passing large clots from your vagina. Save any clots to show your health care provider.  You have a foul smelling discharge from your vagina.  You have trouble urinating.  You are urinating frequently.  You have pain when you urinate.  You have a change in your bowel movements.  You have increasing redness, pain, or swelling near your incision.  You have pus draining from your incision.  Your incision is separating.  You have painful, hard, or reddened breasts.  You have a severe headache.  You have blurred vision or see spots.  You feel sad or depressed.  You have thoughts of hurting yourself or your newborn.  You have questions about your care, the care of your newborn, or medications.  You are dizzy or lightheaded.  You have a rash.  You  have pain, redness, or swelling at the site of the removed intravenous access (IV) tube.  You have nausea or vomiting.  You stopped breastfeeding and have not had a menstrual period within 12 weeks of stopping.  You are not breastfeeding and have not had a menstrual period within 12 weeks of delivery.  You have a fever. SEEK  IMMEDIATE MEDICAL CARE IF:  You have persistent pain.  You have chest pain.  You have shortness of breath.  You faint.  You have leg pain.  You have stomach pain.  Your vaginal bleeding saturates 2 or more sanitary pads in 1 hour. MAKE SURE YOU:   Understand these instructions.  Will watch your condition.  Will get help right away if you are not doing well or get worse. Document Released: 02/06/2002 Document Revised: 01/17/2013 Document Reviewed: 01/12/2012 [facility] Patient Information 2014 [place].

## 2013-07-09 ENCOUNTER — Other Ambulatory Visit: Payer: Medicaid Other

## 2013-07-09 ENCOUNTER — Ambulatory Visit: Payer: Self-pay

## 2013-07-09 ENCOUNTER — Ambulatory Visit (HOSPITAL_COMMUNITY): Payer: Medicaid Other

## 2013-07-09 NOTE — Lactation Note (Signed)
This note was copied from the chart of [place]. Lactation Consultation Note    Follow up consult with this mom of a NICU baby, now almost 60 days old, and 36 1/7 weeks corrected gestation. The baby is being discharged to home with parents today, and they are rooming in with  baby at the present. Mom has been pumping, but not being very compliant. I asked her what her plan was, and she told me her milk "just came in yesterday" . Mom did not have the caps for the DEP, and did not want to purchase new ones, so was given a a hand pump to use. She did not pump at all last night. I reviewed with her supply and demand, and tried to show her how to use the hand pump, but mom was not paying attention. I explained to dad how the pump worked, gave mom the parts she needed for the DEP, and suggested she pump while she  can with DEP. Mom is active with WIC, but has not yet gone for a pump. I advised mom to do so, since she will need to supplement her baby with a bottle, and can use EBM.   Dad was bottle feeding formula when I was with them. I reviewed how a late pre term baby is normally very sleepy, and if  Breast fed,will need to be supplemented with a bottle after each breast feeding. I also told mom that in order to maintain a milk supply, she would need to pump every 3 hours, 8 times a day. I told mom and dad to call for any questions/concerns,prior to their discharge to home today.  Patient Name: Ann Robertson [id number] Date: 07/09/2013 Reason for consult: Follow-up assessment   Maternal Data    Feeding Feeding Type: Formula Nipple Type: Slow - flow  LATCH Score/Interventions                      Lactation Tools Discussed/Used     Consult Status Consult Status: Complete Follow-up type: Call as needed    Tonna Corner 07/09/2013, 10:20 AM

## 2013-07-10 ENCOUNTER — Telehealth: Payer: Self-pay | Admitting: *Deleted

## 2013-07-10 NOTE — Telephone Encounter (Signed)
Called pt and left message that I was checking on her due to missed clinic appt yesterday. Her next scheduled appt is 2/12 @ 1030. She may call our office if she has questions or problems or go to MAU if after clinic hours and she is having an urgent medical need.

## 2013-07-12 ENCOUNTER — Other Ambulatory Visit: Payer: Medicaid Other

## 2013-07-13 ENCOUNTER — Encounter: Payer: Self-pay | Admitting: Nurse Practitioner

## 2013-07-20 ENCOUNTER — Inpatient Hospital Stay (HOSPITAL_COMMUNITY)
Admission: AD | Admit: 2013-07-20 | Discharge: 2013-07-20 | Disposition: A | Discharge status: Home / Self Care | Payer: Medicaid Other | Source: Ambulatory Visit | Attending: Family Medicine | Admitting: Family Medicine

## 2013-07-20 ENCOUNTER — Telehealth: Payer: Self-pay | Admitting: *Deleted

## 2013-07-20 ENCOUNTER — Encounter (HOSPITAL_COMMUNITY): Payer: Self-pay | Admitting: General Practice

## 2013-07-20 DIAGNOSIS — E109 Type 1 diabetes mellitus without complications: Secondary | ICD-10-CM | POA: Insufficient documentation

## 2013-07-20 DIAGNOSIS — Z87891 Personal history of nicotine dependence: Secondary | ICD-10-CM | POA: Insufficient documentation

## 2013-07-20 DIAGNOSIS — O99893 Other specified diseases and conditions complicating puerperium: Secondary | ICD-10-CM | POA: Insufficient documentation

## 2013-07-20 DIAGNOSIS — O9989 Other specified diseases and conditions complicating pregnancy, childbirth and the puerperium: Principal | ICD-10-CM

## 2013-07-20 DIAGNOSIS — R109 Unspecified abdominal pain: Secondary | ICD-10-CM | POA: Insufficient documentation

## 2013-07-20 DIAGNOSIS — G8918 Other acute postprocedural pain: Secondary | ICD-10-CM

## 2013-07-20 DIAGNOSIS — Z794 Long term (current) use of insulin: Secondary | ICD-10-CM | POA: Insufficient documentation

## 2013-07-20 DIAGNOSIS — R42 Dizziness and giddiness: Secondary | ICD-10-CM | POA: Insufficient documentation

## 2013-07-20 LAB — CBC
HCT: 34 % — ABNORMAL LOW (ref 36.0–46.0)
Hemoglobin: 11.7 g/dL — ABNORMAL LOW (ref 12.0–15.0)
MCH: 30.6 pg (ref 26.0–34.0)
MCHC: 34.4 g/dL (ref 30.0–36.0)
MCV: 89 fL (ref 78.0–100.0)
PLATELETS: 402 10*3/uL — AB (ref 150–400)
RBC: 3.82 MIL/uL — AB (ref 3.87–5.11)
RDW: 12.2 % (ref 11.5–15.5)
WBC: 10.2 10*3/uL (ref 4.0–10.5)

## 2013-07-20 LAB — GLUCOSE, CAPILLARY
GLUCOSE-CAPILLARY: 101 mg/dL — AB (ref 70–99)
Glucose-Capillary: 58 mg/dL — ABNORMAL LOW (ref 70–99)

## 2013-07-20 MED ORDER — KETOROLAC TROMETHAMINE 10 MG PO TABS: 10.0000 mg | ORAL_TABLET | Freq: Four times a day (QID) | ORAL | Status: DC | PRN

## 2013-07-20 MED ORDER — METHYLERGONOVINE MALEATE 0.2 MG PO TABS: 0.2000 mg | ORAL_TABLET | Freq: Three times a day (TID) | ORAL | Status: DC

## 2013-07-20 MED ORDER — IBUPROFEN 400 MG PO TABS
400.0000 mg | ORAL_TABLET | ORAL | Status: DC | PRN
  Administered 2013-07-20: 400 mg via ORAL
  Filled 2013-07-20: qty 1

## 2013-07-20 MED ORDER — OXYCODONE-ACETAMINOPHEN 5-325 MG PO TABS
2.0000 | ORAL_TABLET | Freq: Once | ORAL | Status: AC
  Administered 2013-07-20: 2 via ORAL
  Filled 2013-07-20: qty 2

## 2013-07-20 NOTE — MAU Provider Note (Signed)
History     CSN: [id number]  Arrival date and time: 07/20/13 1311   None     Chief Complaint  Patient presents with  . Abdominal Pain  . Vaginal Bleeding   HPI Comments: Complains of continued bleeding and abdominal pain since C-section on 07/05/13. She reports that bleeding stopped Wednesday, but then started again Thursday after being active with doing errands. Passing some small clots, and using ~ 2 pads in last 24hrs. She also reports feeling lightheaded and dizziness starting this morning, but admits to not eating breakfast after taking her insulin. She rates her abdominal pain as 8/10, and "feeling like her stomach is empty." She had been taking Percocet, but reports running out this morning (Husband says she ran out 2 days ago). She denies any fevers, chills, N/V/D, or dysuria.    Past Medical History  Diagnosis Date  . Diabetes mellitus   . Juvenile diabetes mellitus   . Juvenile diabetes 2000  . Neuropathy   . High cholesterol   . HSV-2 infection   . STD (sexually transmitted disease)     Past Surgical History  Procedure Laterality Date  . Cesarean section    . Wisdom tooth extraction    . Cesarean section N/A 07/05/2013    Procedure: CESAREAN SECTION;  Surgeon: Emily Filbert, MD;  Location: [facility];  Service: Obstetrics;  Laterality: N/A;    Family History  Problem Relation Age of Onset  . Stroke Mother   . Hypertension Mother   . Heart disease Mother   . Kidney disease Mother   . Hyperlipidemia Father   . Diabetes Father     History  Substance Use Topics  . Smoking status: Former Research scientist (life sciences)  . Smokeless tobacco: Not on file  . Alcohol Use: No    Allergies:  Allergies  Allergen Reactions  . Hydromorphone Hcl Rash    Prescriptions prior to admission  Medication Sig Dispense Refill  . insulin lispro (HUMALOG) 100 UNIT/ML injection Inject 2-14 Units into the skin 4 (four) times daily. Correction scale: fasting and 2 hrs after first bite of each meal: Fasting  @ 8am and 2 hrs after the start of each meal 121-160--2 units 161-200--4 units 201-240--6 units 241-280--8 units 281-320--10 units 321-360--12 units 361-420--14 units (call MD if symptomatic-headache, frequent urination, excessive thirst      . insulin NPH Human (HUMULIN N,NOVOLIN N) 100 UNIT/ML injection Inject 12 Units into the skin 2 (two) times daily.      Marland Kitchen oxyCODONE-acetaminophen (PERCOCET/ROXICET) 5-325 MG per tablet Take 1-2 tablets by mouth every 4 (four) hours as needed for severe pain (moderate - severe pain).  20 tablet  0    Review of Systems  Constitutional: Negative for fever and chills.  Eyes: Negative for blurred vision.  Respiratory: Negative for cough.   Cardiovascular: Negative for chest pain.  Gastrointestinal: Positive for abdominal pain. Negative for nausea, vomiting, diarrhea and constipation.  Genitourinary: Negative for dysuria, frequency and flank pain.  Skin: Negative for rash.  Neurological: Positive for dizziness. Negative for headaches.   Physical Exam   Blood pressure 128/85, pulse 139, temperature 97.9 F (36.6 C), temperature source Oral, resp. rate 18, height 5' 4.5" (1.638 m), weight 63.957 kg (141 lb), unknown if currently breastfeeding.  Physical Exam  Constitutional: She appears well-developed and well-nourished.  Eyes: EOM are normal. Pupils are equal, round, and reactive to light.  Cardiovascular: Regular rhythm.   No murmur heard. Tachycardic   Respiratory: Effort normal and breath sounds  normal. No respiratory distress.  GI: Soft. There is no rebound and no guarding.  Mild Lower abdominal tenderness. Uterus: firm; Low transverse incision is clean, approximated and healing well.   Musculoskeletal: She exhibits no edema.    MAU Course  Procedures CBC, Orthostatic CBGs Assessment and Plan   # Abdominal pain & bleeding - Csection incision healing well - Hgb 11.7, Increased from 10.6 2 wks prior - Orthostatic Neg - No sign of  infection: WBC wnl, Afebrile - Given Percocet while in MAU - Rx for Toradol for pain; Methergine for bleeding   # Hypoglycemia: Resolved  - likely due to insulin administration w/o eating - improved with eating  Discussed pain with Dr Hulan Fray who evaluated pt and agreed with plan   Phill Myron 07/20/2013, 2:29 PM   I have seen the patient with the resident/student and agree with the above.  Mathis Bud

## 2013-07-20 NOTE — Discharge Instructions (Signed)
Postpartum Care After Cesarean Delivery °After you deliver your newborn (postpartum period), the usual stay in the hospital is 24 72 hours. If there were problems with your labor or delivery, or if you have other medical problems, you might be in the hospital longer.  °While you are in the hospital, you will receive help and instructions on how to care for yourself and your newborn during the postpartum period.  °While you are in the hospital: °· It is normal for you to have pain or discomfort from the incision in your abdomen. Be sure to tell your nurses when you are having pain, where the pain is located, and what makes the pain worse. °· If you are breastfeeding, you may feel uncomfortable contractions of your uterus for a couple of weeks. This is normal. The contractions help your uterus get back to normal size. °· It is normal to have some bleeding after delivery. °· For the first 1 3 days after delivery, the flow is red and the amount may be similar to a period. °· It is common for the flow to start and stop. °· In the first few days, you may pass some small clots. Let your nurses know if you begin to pass large clots or your flow increases. °· Do not  flush blood clots down the toilet before having the nurse look at them. °· During the next 3 10 days after delivery, your flow should become more watery and pink or brown-tinged in color. °· Ten to fourteen days after delivery, your flow should be a small amount of yellowish-white discharge. °· The amount of your flow will decrease over the first few weeks after delivery. Your flow may stop in 6 8 weeks. Most women have had their flow stop by 12 weeks after delivery. °· You should change your sanitary pads frequently. °· Wash your hands thoroughly with soap and water for at least 20 seconds after changing pads, using the toilet, or before holding or feeding your newborn. °· Your intravenous (IV) tubing will be removed when you are drinking enough fluids. °· The  urine drainage tube (urinary catheter) that was inserted before delivery may be removed within 6 8 hours after delivery or when feeling returns to your legs. You should feel like you need to empty your bladder within the first 6 8 hours after the catheter has been removed. °· In case you become weak, lightheaded, or faint, call your nurse before you get out of bed for the first time and before you take a shower for the first time. °· Within the first few days after delivery, your breasts may begin to feel tender and full. This is called engorgement. Breast tenderness usually goes away within 48 72 hours after engorgement occurs. You may also notice milk leaking from your breasts. If you are not breastfeeding, do not stimulate your breasts. Breast stimulation can make your breasts produce more milk. °· Spending as much time as possible with your newborn is very important. During this time, you and your newborn can feel close and get to know each other. Having your newborn stay in your room (rooming in) will help to strengthen the bond with your newborn. It will give you time to get to know your newborn and become comfortable caring for your newborn. °· Your hormones change after delivery. Sometimes the hormone changes can temporarily cause you to feel sad or tearful. These feelings should not last more than a few days. If these feelings last longer   than that, you should talk to your caregiver. °· If desired, talk to your caregiver about methods of family planning or contraception. °· Talk to your caregiver about immunizations. Your caregiver may want you to have the following immunizations before leaving the hospital: °· Tetanus, diphtheria, and pertussis (Tdap) or tetanus and diphtheria (Td) immunization. It is very important that you and your family (including grandparents) or others caring for your newborn are up-to-date with the Tdap or Td immunizations. The Tdap or Td immunization can help protect your newborn  from getting ill. °· Rubella immunization. °· Varicella (chickenpox) immunization. °· Influenza immunization. You should receive this annual immunization if you did not receive the immunization during your pregnancy. °Document Released: 02/09/2012 Document Reviewed: 02/09/2012 °[employer]® Patient Information ©2014 [employer]. ° °

## 2013-07-20 NOTE — Telephone Encounter (Signed)
Patient left a message that she needs more meds. She states that it is okay to leave a detailed message.

## 2013-07-20 NOTE — MAU Note (Signed)
Pt presents to MAU with c/o abdominal pain and vaginal bleeding. Pt describes her feeling as feeling empty in her stomach and feeling faint. She is requesting further percocet for pain.

## 2013-07-23 NOTE — Telephone Encounter (Signed)
Patient went to MAU over the weekend for pain/bleeding and was given prescriptions. Called patient, no answer- left message stating we are trying to return your phone call, if you are still needing assistance please call us back at the clinics

## 2013-07-30 ENCOUNTER — Inpatient Hospital Stay (HOSPITAL_COMMUNITY): Admit: 2013-07-30 | Payer: Medicaid Other | Admitting: Obstetrics & Gynecology

## 2013-07-30 ENCOUNTER — Encounter (HOSPITAL_COMMUNITY): Payer: Self-pay

## 2013-07-30 SURGERY — Surgical Case
Anesthesia: Regional | Site: Abdomen

## 2013-08-09 ENCOUNTER — Other Ambulatory Visit (HOSPITAL_COMMUNITY): Payer: Self-pay | Admitting: Advanced Practice Midwife

## 2013-08-10 ENCOUNTER — Ambulatory Visit: Payer: Medicaid Other | Admitting: Nurse Practitioner

## 2013-08-12 ENCOUNTER — Encounter: Payer: Self-pay | Admitting: *Deleted

## 2013-08-13 ENCOUNTER — Encounter: Payer: Self-pay | Admitting: Obstetrics & Gynecology

## 2013-08-13 ENCOUNTER — Ambulatory Visit (INDEPENDENT_AMBULATORY_CARE_PROVIDER_SITE_OTHER): Payer: Medicaid Other | Admitting: Obstetrics & Gynecology

## 2013-08-13 MED ORDER — INSULIN LISPRO 100 UNIT/ML ~~LOC~~ SOLN: 2.0000 [IU] | Freq: Four times a day (QID) | SUBCUTANEOUS | Status: DC

## 2013-08-13 MED ORDER — "INSULIN SYRINGE-NEEDLE U-100 31G X 5/16"" 1 ML MISC": Status: DC

## 2013-08-13 MED ORDER — INSULIN GLARGINE 100 UNIT/ML ~~LOC~~ SOLN: 20.0000 [IU] | Freq: Every day | SUBCUTANEOUS | Status: DC

## 2013-08-13 NOTE — Progress Notes (Signed)
  Subjective:     Ann Robertson is a 23 y.o. female who presents for a postpartum visit. She is 6 weeks postpartum following a low cervical transverse Cesarean section. I have fully reviewed the prenatal and intrapartum course. The delivery was at [place] gestational weeks. Outcome: primary cesarean section, low transverse incision. Anesthesia: spinal. Postpartum course has been uncomplicated. Baby's course has been uncomplicated. Baby is feeding by bottle - Similac with Iron. Bleeding no bleeding. Bowel function is some constipation. Bladder function is normal. Patient is not sexually active. Contraception method is none. Postpartum depression screening: negative.     Review of Systems A comprehensive review of systems was negative.   Objective:    BP 116/80  Pulse 116  Temp(Src) 98.8 F (37.1 C) (Oral)  Ht 5\' 4"  (1.626 m)  Wt 137 lb 3.2 oz (62.234 kg)  BMI 23.54 kg/m2  Breastfeeding? No  General:  alert, cooperative and no distress   Breasts:  inspection negative, no nipple discharge or bleeding, no masses or nodularity palpable  Lungs: clear to auscultation bilaterally  Heart:  regular rate and rhythm  Abdomen: soft, non tender, decreased bowel sounds, non distended. incision healed completely, 2 mm scab on right aspect of incision   Vulva:  normal  Vagina: normal vagina, no discharge, exudate, lesion, or erythema  Cervix:  multiparous appearance  Corpus: normal size, contour, position, consistency, mobility, non-tender  Adnexa:  no mass, fullness, tenderness  Rectal Exam: Not performed.        Assessment:     Normal postpartum exam. Pap smear not done at today's visit.   Plan:    1. Contraception: condoms 2. Advised patient to restart pre-pregnancy insulin regimen of Lantus as she is still using NPH and reports CBG in 300-400 range. lantus was re-ordered Advised patient to follow up with endocrinologist to ensure adequate glycemic treatment and to evaluate her for possible  gastroparesis. Discussed the need to decrease using percocet as it may cause constipation and worsen her abdominal pain. Patient seems to be in denial regarding the severity of her poorly controlled diabetes. She becomes easily argumentative when elevated CBGs are discussed 3. Follow up in: July 2015  for pap smear or as needed.

## 2013-08-20 NOTE — Progress Notes (Signed)
NST reviewed and reactive.  

## 2013-12-04 ENCOUNTER — Encounter: Payer: Self-pay | Admitting: Endocrinology

## 2013-12-04 ENCOUNTER — Ambulatory Visit (INDEPENDENT_AMBULATORY_CARE_PROVIDER_SITE_OTHER): Payer: Medicaid Other | Admitting: Endocrinology

## 2013-12-04 VITALS — BP 120/82 | HR 105 | Temp 98.2°F | Ht 64.0 in | Wt 136.0 lb

## 2013-12-04 DIAGNOSIS — E1049 Type 1 diabetes mellitus with other diabetic neurological complication: Secondary | ICD-10-CM

## 2013-12-04 MED ORDER — INSULIN LISPRO PROT & LISPRO (75-25 MIX) 100 UNIT/ML ~~LOC~~ SUSP: SUBCUTANEOUS | Status: DC

## 2013-12-04 NOTE — Patient Instructions (Addendum)
good diet and exercise habits significanly improve the control of your diabetes.  please let me know if you wish to be referred to a dietician.  high blood sugar is very risky to your health.  you should see an eye doctor and dentist every year.  You are at higher than average risk for pneumonia and hepatitis-B.  You should be vaccinated against both.    controlling your blood pressure and cholesterol drastically reduces the damage diabetes does to your body.  this also applies to quitting smoking.  please discuss these with your doctor.   check your blood sugar 4 times a day: before the 3 meals, and at bedtime.  also check if you have symptoms of your blood sugar being too high or too low.  please keep a record of the readings and bring it to your next appointment here.  You can write it on any piece of paper.  please call us sooner if your blood sugar goes below 70, or if you have a lot of readings over 200.   For now, please change both insulins to humalog 75/25 (see below).  Please come back for a follow-up appointment in 2 weeks.

## 2013-12-04 NOTE — Progress Notes (Signed)
Subjective:    Patient ID: Ann Robertson, female    DOB: [birth year], 23 y.o.   MRN: [id number]  HPI pt states DM was dx'ed in 1999; she has mild neuropathy of the lower extremities; she is unaware of any associated chronic complications.  he has been on insulin since dx.  She took pump rx only during the 2014-2015 pregnancy, and she does not wish to resume now.  pt says her diet and exercise are "fair." she has never had pancreatitis; her last episode of severe hypoglycemia was approx 2012; last episode of DKA was in 2008, and was mild.  She says cbg's are variable.  She takes NPH bid, and  qac humalog (averages units 0-14 units per meal, according to cbg + 1 unit/6 grams CHO).  She says she forgets 1-2 injections per day.   Past Medical History  Diagnosis Date  . Diabetes mellitus   . Juvenile diabetes mellitus   . Juvenile diabetes 2000  . Neuropathy   . High cholesterol   . HSV-2 infection   . STD (sexually transmitted disease)     Past Surgical History  Procedure Laterality Date  . Cesarean section    . Wisdom tooth extraction    . Cesarean section N/A 07/05/2013    Procedure: CESAREAN SECTION;  Surgeon: Emily Filbert, MD;  Location: [facility];  Service: Obstetrics;  Laterality: N/A;    History   Social History  . Marital Status: Single    Spouse Name: N/A    Number of Children: N/A  . Years of Education: N/A   Occupational History  . Not on file.   Social History Main Topics  . Smoking status: Former Research scientist (life sciences)  . Smokeless tobacco: Not on file  . Alcohol Use: No  . Drug Use: No  . Sexual Activity: Yes    Birth Control/ Protection: None   Other Topics Concern  . Not on file   Social History Narrative  . No narrative on file    Current Outpatient Prescriptions on File Prior to Visit  Medication Sig Dispense Refill  . Insulin Syringe-Needle U-100 (ADVOCATE INSULIN SYRINGE) 31G X 5/16" 1 ML MISC Use to administer insulin  100 each  0  . oxyCODONE-acetaminophen  (PERCOCET/ROXICET) 5-325 MG per tablet Take 1-2 tablets by mouth every 4 (four) hours as needed for severe pain (moderate - severe pain).  20 tablet  0   No current facility-administered medications on file prior to visit.    Allergies  Allergen Reactions  . Hydromorphone Hcl Rash    Family History  Problem Relation Age of Onset  . Stroke Mother   . Hypertension Mother   . Heart disease Mother   . Kidney disease Mother   . Hyperlipidemia Father   . Diabetes Father     BP 120/82  Pulse 105  Temp(Src) 98.2 F (36.8 C) (Oral)  Ht 5\' 4"  (1.626 m)  Wt 136 lb (61.689 kg)  BMI 23.33 kg/m2  SpO2 98%     Review of Systems denies weight loss, blurry vision, headache, chest pain, sob, n/v, muscle cramps, excessive diaphoresis, memory loss, depression, cold intolerance, and rhinorrhea.  She has polyuria and easy bruising.      Objective:   Physical Exam VS: see vs page GEN: no distress HEAD: head: no deformity eyes: no periorbital swelling, no proptosis.   external nose and ears are normal.   mouth: no lesion seen NECK: supple, thyroid is slightly and diffusely enlarged.   CHEST  WALL: no deformity LUNGS:  Clear to auscultation.  CV: reg rate and rhythm, no murmur ABD: abdomen is soft, nontender.  no hepatosplenomegaly.  not distended.  no hernia MUSCULOSKELETAL: muscle bulk and strength are grossly normal.  no obvious joint swelling.  gait is normal and steady EXTEMITIES: no deformity.  no ulcer on the feet.  feet are of normal color and temp.  no edema PULSES: dorsalis pedis intact bilat.  no carotid bruit NEURO:  cn 2-12 grossly intact.   readily moves all 4's.  sensation is intact to touch on the feet SKIN:  Normal texture and temperature.  No rash or suspicious lesion is visible.   NODES:  None palpable at the neck PSYCH: alert, well-oriented.  Does not appear anxious nor depressed.  Lab Results  Component Value Date   HGBA1C 8.7* 05/07/2013  i have reviewed the  following outside records: office notes  i reviewed electrocardiogram from 2012 (rhythm strips only)    Assessment & Plan:  DM: moderate exacerbation.   Noncompliance with cbg recording and insulin dosing, new to me: pt says she wants to take a simpler regimen. Side-effect of treatment (severe hypoglycemia).  She halst has h/o DKA.  In this context, she is unlikely to safely achieve an a1c of 7%.      Patient is advised the following: Patient Instructions  good diet and exercise habits significanly improve the control of your diabetes.  please let me know if you wish to be referred to a dietician.  high blood sugar is very risky to your health.  you should see an eye doctor and dentist every year.  You are at higher than average risk for pneumonia and hepatitis-B.  You should be vaccinated against both.    controlling your blood pressure and cholesterol drastically reduces the damage diabetes does to your body.  this also applies to quitting smoking.  please discuss these with your doctor.   check your blood sugar 4 times a day: before the 3 meals, and at bedtime.  also check if you have symptoms of your blood sugar being too high or too low.  please keep a record of the readings and bring it to your next appointment here.  You can write it on any piece of paper.  please call us sooner if your blood sugar goes below 70, or if you have a lot of readings over 200.   For now, please change both insulins to humalog 75/25 (see below).  Please come back for a follow-up appointment in 2 weeks.

## 2013-12-06 DIAGNOSIS — E1049 Type 1 diabetes mellitus with other diabetic neurological complication: Secondary | ICD-10-CM | POA: Insufficient documentation

## 2013-12-18 ENCOUNTER — Ambulatory Visit (INDEPENDENT_AMBULATORY_CARE_PROVIDER_SITE_OTHER): Payer: Medicaid Other | Admitting: Endocrinology

## 2013-12-18 ENCOUNTER — Encounter: Payer: Self-pay | Admitting: Endocrinology

## 2013-12-18 VITALS — BP 126/84 | HR 101 | Temp 98.4°F | Ht 64.0 in | Wt 138.0 lb

## 2013-12-18 DIAGNOSIS — E1049 Type 1 diabetes mellitus with other diabetic neurological complication: Secondary | ICD-10-CM

## 2013-12-18 NOTE — Progress Notes (Signed)
Subjective:    Patient ID: Ann Robertson, female    DOB: May 28, 1991, 23 y.o.   MRN: [id number]  HPI pt returns for f/u of type 1 DM (dx'ed 1999; she has mild neuropathy of the lower extremities; she is unaware of any associated chronic complications; she has been on insulin since dx; she took pump rx only during the 2014-2015 pregnancy, and she does not wish to resume now; she has never had pancreatitis; her last episode of severe hypoglycemia was approx 2012; last episode of DKA was in 2008, and was mild). no cbg record, but states cbg's are much better.  It varies from 34-341.  It was lowest after she takes insulin in am, but does not eat breakfast.  It is mildly low in the middle of the night.  Past Medical History  Diagnosis Date  . Diabetes mellitus   . Juvenile diabetes mellitus   . Juvenile diabetes 2000  . Neuropathy   . High cholesterol   . HSV-2 infection   . STD (sexually transmitted disease)     Past Surgical History  Procedure Laterality Date  . Cesarean section    . Wisdom tooth extraction    . Cesarean section N/A 07/05/2013    Procedure: CESAREAN SECTION;  Surgeon: Emily Filbert, MD;  Location: [facility];  Service: Obstetrics;  Laterality: N/A;    History   Social History  . Marital Status: Single    Spouse Name: N/A    Number of Children: N/A  . Years of Education: N/A   Occupational History  . Not on file.   Social History Main Topics  . Smoking status: Former Research scientist (life sciences)  . Smokeless tobacco: Not on file  . Alcohol Use: No  . Drug Use: No  . Sexual Activity: Yes    Birth Control/ Protection: None   Other Topics Concern  . Not on file   Social History Narrative  . No narrative on file    Current Outpatient Prescriptions on File Prior to Visit  Medication Sig Dispense Refill  . Insulin Syringe-Needle U-100 (ADVOCATE INSULIN SYRINGE) 31G X 5/16" 1 ML MISC Use to administer insulin  100 each  0  . oxyCODONE-acetaminophen (PERCOCET/ROXICET) 5-325 MG per  tablet Take 1-2 tablets by mouth every 4 (four) hours as needed for severe pain (moderate - severe pain).  20 tablet  0   No current facility-administered medications on file prior to visit.    Allergies  Allergen Reactions  . Hydromorphone Hcl Rash    Family History  Problem Relation Age of Onset  . Stroke Mother   . Hypertension Mother   . Heart disease Mother   . Kidney disease Mother   . Hyperlipidemia Father   . Diabetes Father     BP 126/84  Pulse 101  Temp(Src) 98.4 F (36.9 C) (Oral)  Ht 5\' 4"  (1.626 m)  Wt 138 lb (62.596 kg)  BMI 23.68 kg/m2  SpO2 98%  Review of Systems Denies LOC.  She has gained a few lbs.      Objective:   Physical Exam VITAL SIGNS:  See vs page GENERAL: no distress. PSYCH: Alert and well-oriented.  Does not appear anxious nor depressed.     Lab Results  Component Value Date   HGBA1C 8.7* 05/07/2013      Assessment & Plan:  DM: mild exacerbation Noncompliance with cbg recording: I'll work around this as best I can.  She may need further simplification of her insulin regimen, but we'll  continue bid for now.   Patient is advised the following: Patient Instructions  check your blood sugar 4 times a day: before the 3 meals, and at bedtime.  also check if you have symptoms of your blood sugar being too high or too low.  please keep a record of the readings and bring it to your next appointment here.  You can write it on any piece of paper.  please call us sooner if your blood sugar goes below 70, or if you have a lot of readings over 200.   Please reduce the same insulin to 35 units with breakfast, and 18 units with the evening meal.   Please come back for a follow-up appointment in 4 weeks.   It is important to take this insulin right before you eat, and only when you eat.

## 2013-12-18 NOTE — Patient Instructions (Addendum)
check your blood sugar 4 times a day: before the 3 meals, and at bedtime.  also check if you have symptoms of your blood sugar being too high or too low.  please keep a record of the readings and bring it to your next appointment here.  You can write it on any piece of paper.  please call us sooner if your blood sugar goes below 70, or if you have a lot of readings over 200.   Please reduce the same insulin to 35 units with breakfast, and 18 units with the evening meal.   Please come back for a follow-up appointment in 4 weeks.   It is important to take this insulin right before you eat, and only when you eat.

## 2014-01-22 ENCOUNTER — Ambulatory Visit: Payer: Medicaid Other | Admitting: Endocrinology

## 2014-04-03 ENCOUNTER — Ambulatory Visit: Payer: Medicaid Other | Admitting: Endocrinology

## 2014-04-05 ENCOUNTER — Ambulatory Visit: Payer: Medicaid Other | Admitting: Endocrinology

## 2014-04-11 ENCOUNTER — Encounter: Payer: Self-pay | Admitting: Endocrinology

## 2014-04-19 ENCOUNTER — Ambulatory Visit: Payer: Medicaid Other | Admitting: Endocrinology

## 2014-04-19 LAB — PREGNANCY, URINE: Preg Test, Ur: POSITIVE

## 2014-04-22 ENCOUNTER — Encounter (HOSPITAL_COMMUNITY): Payer: Self-pay | Admitting: *Deleted

## 2014-04-22 ENCOUNTER — Inpatient Hospital Stay (HOSPITAL_COMMUNITY)
Admission: AD | Admit: 2014-04-22 | Discharge: 2014-04-22 | Disposition: A | Discharge status: Home / Self Care | Payer: Medicaid Other | Source: Ambulatory Visit | Attending: Family Medicine | Admitting: Family Medicine

## 2014-04-22 DIAGNOSIS — O09892 Supervision of other high risk pregnancies, second trimester: Secondary | ICD-10-CM

## 2014-04-22 DIAGNOSIS — N912 Amenorrhea, unspecified: Secondary | ICD-10-CM

## 2014-04-22 DIAGNOSIS — Z3201 Encounter for pregnancy test, result positive: Secondary | ICD-10-CM

## 2014-04-22 LAB — URINE MICROSCOPIC-ADD ON

## 2014-04-22 LAB — URINALYSIS, ROUTINE W REFLEX MICROSCOPIC
BILIRUBIN URINE: NEGATIVE
Glucose, UA: >1000 mg/dL — AB
KETONES UR: NEGATIVE mg/dL
Leukocytes, UA: NEGATIVE
Nitrite: NEGATIVE
PH: 6 (ref 5.0–8.0)
PROTEIN: 30 mg/dL — AB
Specific Gravity, Urine: 1.025 (ref 1.005–1.030)
Urobilinogen, UA: 0.2 mg/dL (ref 0.0–1.0)

## 2014-04-22 NOTE — MAU Provider Note (Signed)
Subjective:  [name] is a 23 y.o. female [phone] of unknown gestation who presents to MAU for pregnancy verification. She had a baby in February and does not recall when her LMP was. She has starting feeling fetal kicks; she has not started prenatal care. She desires to go back to the clinic downstairs for care; they delivered her last baby.  Unsure of last menstrual cycle.    Objective: GENERAL: Well-developed, well-nourished female in no acute distress.  HEENT: Normocephalic, atraumatic.   LUNGS: Effort normal SKIN: Warm, dry and without erythema ABDOMEN: Soft, non tender. Fundus just below umbilicus likely [id number] weeks gestation.  PSYCH: Normal mood and affect  Filed Vitals:   04/22/14 0954  BP: 124/82  Pulse: 97  Temp: 98.8 F (37.1 C)  Resp: 16    MDM: + fetal heart tones    Assessment: Positive pregnancy test Unknown LMP  Plan: Discharge home in stable condition Follow up with [facility]; referral sent Out patient [place] for dating; they will call to schedule Warning signs discussed Return to MAU for emergencies only Prenatal vitamins daily  Darrelyn Hillock Rasch, NP 04/22/2014 10:21 AM

## 2014-04-22 NOTE — MAU Note (Signed)
Patient presents to determine whether or not she is pregnant and with complaint of abdominal pain X 2 weeks.

## 2014-04-23 ENCOUNTER — Ambulatory Visit (HOSPITAL_COMMUNITY)
Admission: RE | Admit: 2014-04-23 | Discharge: 2014-04-23 | Disposition: A | Discharge status: Home / Self Care | Payer: Medicaid Other | Source: Ambulatory Visit | Attending: Obstetrics and Gynecology | Admitting: Obstetrics and Gynecology

## 2014-04-23 DIAGNOSIS — Z1389 Encounter for screening for other disorder: Secondary | ICD-10-CM | POA: Insufficient documentation

## 2014-04-23 DIAGNOSIS — Z3A22 22 weeks gestation of pregnancy: Secondary | ICD-10-CM | POA: Diagnosis not present

## 2014-04-23 DIAGNOSIS — O0930 Supervision of pregnancy with insufficient antenatal care, unspecified trimester: Secondary | ICD-10-CM | POA: Diagnosis present

## 2014-04-23 DIAGNOSIS — N912 Amenorrhea, unspecified: Secondary | ICD-10-CM

## 2014-04-23 DIAGNOSIS — O3421 Maternal care for scar from previous cesarean delivery: Secondary | ICD-10-CM | POA: Diagnosis present

## 2014-04-23 DIAGNOSIS — O24012 Pre-existing diabetes mellitus, type 1, in pregnancy, second trimester: Secondary | ICD-10-CM | POA: Insufficient documentation

## 2014-04-23 DIAGNOSIS — Z3201 Encounter for pregnancy test, result positive: Secondary | ICD-10-CM

## 2014-04-23 DIAGNOSIS — O26849 Uterine size-date discrepancy, unspecified trimester: Secondary | ICD-10-CM | POA: Diagnosis present

## 2014-04-23 DIAGNOSIS — O24019 Pre-existing diabetes mellitus, type 1, in pregnancy, unspecified trimester: Secondary | ICD-10-CM | POA: Insufficient documentation

## 2014-04-23 DIAGNOSIS — Z8751 Personal history of pre-term labor: Secondary | ICD-10-CM | POA: Diagnosis not present

## 2014-04-23 DIAGNOSIS — Z36 Encounter for antenatal screening of mother: Secondary | ICD-10-CM | POA: Diagnosis not present

## 2014-04-23 DIAGNOSIS — O09892 Supervision of other high risk pregnancies, second trimester: Secondary | ICD-10-CM

## 2014-04-25 ENCOUNTER — Encounter (HOSPITAL_COMMUNITY): Payer: Self-pay | Admitting: Obstetrics and Gynecology

## 2014-04-29 ENCOUNTER — Ambulatory Visit: Payer: Medicaid Other | Admitting: Family Medicine

## 2014-04-29 ENCOUNTER — Encounter: Payer: Medicaid Other | Attending: Obstetrics & Gynecology | Admitting: *Deleted

## 2014-04-29 VITALS — Wt 147.0 lb

## 2014-04-29 DIAGNOSIS — O24012 Pre-existing diabetes mellitus, type 1, in pregnancy, second trimester: Secondary | ICD-10-CM | POA: Insufficient documentation

## 2014-04-29 DIAGNOSIS — Z794 Long term (current) use of insulin: Secondary | ICD-10-CM | POA: Diagnosis not present

## 2014-04-29 DIAGNOSIS — Z713 Dietary counseling and surveillance: Secondary | ICD-10-CM | POA: Diagnosis not present

## 2014-04-29 MED ORDER — PRENATAL VITAMINS PLUS 27-1 MG PO TABS: 1.0000 | ORAL_TABLET | Freq: Every day | ORAL | Status: DC

## 2014-04-29 NOTE — Progress Notes (Signed)
T1DM since the age of 23yo. She was treated here with her last pregnancy. She States that she Had an appointment with Endocrinologist  Dr. Renato Shin last week with but missed it  due to appt conflict.  Shamyiah states that due to Medicaid she would prefer to have her Endocrinologist manage her diabetes rather than [place]. I made her aware that some of the Endocrinologists choose not to manage DM during pregnancy. She is presently using 75/25 TID at varying doses. Her FBS runs approximately 200mg /dl and her pp between 100-400mg /dl. I emphasized the need for good glucose control in relationship to the health of the baby. Her dietary intake is at will. She will meet with the dietitian today. I have encouraged Elain to contact Dr. Cordelia Pen office ASAP for an appointment. She is presently using the Accu-Chek Nano. I advised her that Dr. Loanne Drilling can order the testing supplies and they are covered by Medicaid.

## 2014-04-29 NOTE — Progress Notes (Signed)
Nutrition note: DM diet education Pt has Type 1 DM. Pt reports her fasting BS are in the 200s. Pt reports eating 3-4x/d. Pt is not taking a PNV yet. Pt reports no N/V or heartburn. NKFA. Pt reports walking most days. Pt received verbal & written education on DM diet during pregnancy. Discussed importance of tight BS control for pregnancy as well as BF. Encouraged PNV (nurse to send Rx to pharmacy). Discussed wt gain goals of ~1#/wk. Pt agrees to try to follow suggested eating plan with 3 meals & 1-3 snacks/d with proper CHO/ protein combination. Pt does not have [place] yet. Pt plans to BF. F/u in 2-4 wks Vladimir Faster, MS, RD, LDN, [facility]

## 2014-05-01 ENCOUNTER — Encounter: Payer: Self-pay | Admitting: *Deleted

## 2014-05-02 ENCOUNTER — Encounter: Payer: Self-pay | Admitting: Obstetrics & Gynecology

## 2014-05-06 ENCOUNTER — Encounter: Payer: Self-pay | Admitting: Obstetrics and Gynecology

## 2014-05-06 ENCOUNTER — Other Ambulatory Visit: Payer: Self-pay | Admitting: Obstetrics and Gynecology

## 2014-05-06 ENCOUNTER — Ambulatory Visit (INDEPENDENT_AMBULATORY_CARE_PROVIDER_SITE_OTHER): Payer: Medicaid Other | Admitting: Obstetrics and Gynecology

## 2014-05-06 ENCOUNTER — Other Ambulatory Visit (HOSPITAL_COMMUNITY)
Admission: RE | Admit: 2014-05-06 | Discharge: 2014-05-06 | Disposition: A | Discharge status: Home / Self Care | Payer: Medicaid Other | Source: Ambulatory Visit | Attending: Obstetrics and Gynecology | Admitting: Obstetrics and Gynecology

## 2014-05-06 VITALS — BP 128/79 | HR 88 | Wt 150.5 lb

## 2014-05-06 DIAGNOSIS — O09219 Supervision of pregnancy with history of pre-term labor, unspecified trimester: Secondary | ICD-10-CM

## 2014-05-06 DIAGNOSIS — O0992 Supervision of high risk pregnancy, unspecified, second trimester: Secondary | ICD-10-CM

## 2014-05-06 DIAGNOSIS — O0990 Supervision of high risk pregnancy, unspecified, unspecified trimester: Secondary | ICD-10-CM | POA: Insufficient documentation

## 2014-05-06 DIAGNOSIS — O0930 Supervision of pregnancy with insufficient antenatal care, unspecified trimester: Secondary | ICD-10-CM | POA: Insufficient documentation

## 2014-05-06 DIAGNOSIS — O0932 Supervision of pregnancy with insufficient antenatal care, second trimester: Secondary | ICD-10-CM

## 2014-05-06 DIAGNOSIS — Z124 Encounter for screening for malignant neoplasm of cervix: Secondary | ICD-10-CM

## 2014-05-06 DIAGNOSIS — O09892 Supervision of other high risk pregnancies, second trimester: Secondary | ICD-10-CM

## 2014-05-06 DIAGNOSIS — O3421 Maternal care for scar from previous cesarean delivery: Secondary | ICD-10-CM

## 2014-05-06 DIAGNOSIS — Z113 Encounter for screening for infections with a predominantly sexual mode of transmission: Secondary | ICD-10-CM

## 2014-05-06 DIAGNOSIS — Z1151 Encounter for screening for human papillomavirus (HPV): Secondary | ICD-10-CM

## 2014-05-06 DIAGNOSIS — O09899 Supervision of other high risk pregnancies, unspecified trimester: Secondary | ICD-10-CM | POA: Insufficient documentation

## 2014-05-06 DIAGNOSIS — O34219 Maternal care for unspecified type scar from previous cesarean delivery: Secondary | ICD-10-CM | POA: Insufficient documentation

## 2014-05-06 DIAGNOSIS — Z01419 Encounter for gynecological examination (general) (routine) without abnormal findings: Secondary | ICD-10-CM | POA: Diagnosis not present

## 2014-05-06 DIAGNOSIS — O09212 Supervision of pregnancy with history of pre-term labor, second trimester: Secondary | ICD-10-CM

## 2014-05-06 DIAGNOSIS — Z118 Encounter for screening for other infectious and parasitic diseases: Secondary | ICD-10-CM

## 2014-05-06 DIAGNOSIS — E1040 Type 1 diabetes mellitus with diabetic neuropathy, unspecified: Secondary | ICD-10-CM

## 2014-05-06 DIAGNOSIS — O24012 Pre-existing diabetes mellitus, type 1, in pregnancy, second trimester: Secondary | ICD-10-CM

## 2014-05-06 LAB — POCT URINALYSIS DIP (DEVICE)
Bilirubin Urine: NEGATIVE
KETONES UR: NEGATIVE mg/dL
Leukocytes, UA: NEGATIVE
Nitrite: NEGATIVE
Protein, ur: 30 mg/dL — AB
SPECIFIC GRAVITY, URINE: 1.02 (ref 1.005–1.030)
UROBILINOGEN UA: 0.2 mg/dL (ref 0.0–1.0)
pH: 7 (ref 5.0–8.0)

## 2014-05-06 LAB — HEMOGLOBIN A1C
Hgb A1c MFr Bld: 11.6 % — ABNORMAL HIGH
Mean Plasma Glucose: 286 mg/dL — ABNORMAL HIGH

## 2014-05-06 NOTE — Progress Notes (Signed)
Last pap was July 2014

## 2014-05-06 NOTE — Progress Notes (Signed)
Fetal echo with Dr. Filbert Schilder 06/03/14 @ 830a.  Growth U/S with Radiology 06/03/14 @ 1045a.

## 2014-05-06 NOTE — Patient Instructions (Signed)
Second Trimester of Pregnancy The second trimester is from week 13 through week 28, months 4 through 6. The second trimester is often a time when you feel your best. Your body has also adjusted to being pregnant, and you begin to feel better physically. Usually, morning sickness has lessened or quit completely, you may have more energy, and you may have an increase in appetite. The second trimester is also a time when the fetus is growing rapidly. At the end of the sixth month, the fetus is about 9 inches long and weighs about 1 pounds. You will likely begin to feel the baby move (quickening) between 18 and 20 weeks of the pregnancy. BODY CHANGES Your body goes through many changes during pregnancy. The changes vary from woman to woman.   Your weight will continue to increase. You will notice your lower abdomen bulging out.  You may begin to get stretch marks on your hips, abdomen, and breasts.  You may develop headaches that can be relieved by medicines approved by your health care provider.  You may urinate more often because the fetus is pressing on your bladder.  You may develop or continue to have heartburn as a result of your pregnancy.  You may develop constipation because certain hormones are causing the muscles that push waste through your intestines to slow down.  You may develop hemorrhoids or swollen, bulging veins (varicose veins).  You may have back pain because of the weight gain and pregnancy hormones relaxing your joints between the bones in your pelvis and as a result of a shift in weight and the muscles that support your balance.  Your breasts will continue to grow and be tender.  Your gums may bleed and may be sensitive to brushing and flossing.  Dark spots or blotches (chloasma, mask of pregnancy) may develop on your face. This will likely fade after the baby is born.  A dark line from your belly button to the pubic area (linea nigra) may appear. This will likely  fade after the baby is born.  You may have changes in your hair. These can include thickening of your hair, rapid growth, and changes in texture. Some women also have hair loss during or after pregnancy, or hair that feels dry or thin. Your hair will most likely return to normal after your baby is born. WHAT TO EXPECT AT YOUR PRENATAL VISITS During a routine prenatal visit:  You will be weighed to make sure you and the fetus are growing normally.  Your blood pressure will be taken.  Your abdomen will be measured to track your baby's growth.  The fetal heartbeat will be listened to.  Any test results from the previous visit will be discussed. Your health care provider may ask you:  How you are feeling.  If you are feeling the baby move.  If you have had any abnormal symptoms, such as leaking fluid, bleeding, severe headaches, or abdominal cramping.  If you have any questions. Other tests that may be performed during your second trimester include:  Blood tests that check for:  Low iron levels (anemia).  Gestational diabetes (between 24 and 28 weeks).  Rh antibodies.  Urine tests to check for infections, diabetes, or protein in the urine.  An ultrasound to confirm the proper growth and development of the baby.  An amniocentesis to check for possible genetic problems.  Fetal screens for spina bifida and Down syndrome. HOME CARE INSTRUCTIONS   Avoid all smoking, herbs, alcohol, and unprescribed   drugs. These chemicals affect the formation and growth of the baby.  Follow your health care provider's instructions regarding medicine use. There are medicines that are either safe or unsafe to take during pregnancy.  Exercise only as directed by your health care provider. Experiencing uterine cramps is a good sign to stop exercising.  Continue to eat regular, healthy meals.  Wear a good support bra for breast tenderness.  Do not use hot tubs, steam rooms, or saunas.  Wear  your seat belt at all times when driving.  Avoid raw meat, uncooked cheese, cat litter boxes, and soil used by cats. These carry germs that can cause birth defects in the baby.  Take your prenatal vitamins.  Try taking a stool softener (if your health care provider approves) if you develop constipation. Eat more high-fiber foods, such as fresh vegetables or fruit and whole grains. Drink plenty of fluids to keep your urine clear or pale yellow.  Take warm sitz baths to soothe any pain or discomfort caused by hemorrhoids. Use hemorrhoid cream if your health care provider approves.  If you develop varicose veins, wear support hose. Elevate your feet for 15 minutes, 3-4 times a day. Limit salt in your diet.  Avoid heavy lifting, wear low heel shoes, and practice good posture.  Rest with your legs elevated if you have leg cramps or low back pain.  Visit your dentist if you have not gone yet during your pregnancy. Use a soft toothbrush to brush your teeth and be gentle when you floss.  A sexual relationship may be continued unless your health care provider directs you otherwise.  Continue to go to all your prenatal visits as directed by your health care provider. SEEK MEDICAL CARE IF:   You have dizziness.  You have mild pelvic cramps, pelvic pressure, or nagging pain in the abdominal area.  You have persistent nausea, vomiting, or diarrhea.  You have a bad smelling vaginal discharge.  You have pain with urination. SEEK IMMEDIATE MEDICAL CARE IF:   You have a fever.  You are leaking fluid from your vagina.  You have spotting or bleeding from your vagina.  You have severe abdominal cramping or pain.  You have rapid weight gain or loss.  You have shortness of breath with chest pain.  You notice sudden or extreme swelling of your face, hands, ankles, feet, or legs.  You have not felt your baby move in over an hour.  You have severe headaches that do not go away with  medicine.  You have vision changes. Document Released: 05/11/2001 Document Revised: 05/22/2013 Document Reviewed: 07/18/2012 [facility] Patient Information 2015 [place], [place]. This information is not intended to replace advice given to you by your health care provider. Make sure you discuss any questions you have with your health care provider.  Contraception Choices Contraception (birth control) is the use of any methods or devices to prevent pregnancy. Below are some methods to help avoid pregnancy. HORMONAL METHODS   Contraceptive implant. This is a thin, plastic tube containing progesterone hormone. It does not contain estrogen hormone. Your health care provider inserts the tube in the inner part of the upper arm. The tube can remain in place for up to 3 years. After 3 years, the implant must be removed. The implant prevents the ovaries from releasing an egg (ovulation), thickens the cervical mucus to prevent sperm from entering the uterus, and thins the lining of the inside of the uterus.  Progesterone-only injections. These injections are given  every 3 months by your health care provider to prevent pregnancy. This synthetic progesterone hormone stops the ovaries from releasing eggs. It also thickens cervical mucus and changes the uterine lining. This makes it harder for sperm to survive in the uterus.  Birth control pills. These pills contain estrogen and progesterone hormone. They work by preventing the ovaries from releasing eggs (ovulation). They also cause the cervical mucus to thicken, preventing the sperm from entering the uterus. Birth control pills are prescribed by a health care provider.Birth control pills can also be used to treat heavy periods.  Minipill. This type of birth control pill contains only the progesterone hormone. They are taken every day of each month and must be prescribed by your health care provider.  Birth control patch. The patch contains hormones similar to  those in birth control pills. It must be changed once a week and is prescribed by a health care provider.  Vaginal ring. The ring contains hormones similar to those in birth control pills. It is left in the vagina for 3 weeks, removed for 1 week, and then a new one is put back in place. The patient must be comfortable inserting and removing the ring from the vagina.A health care provider's prescription is necessary.  Emergency contraception. Emergency contraceptives prevent pregnancy after unprotected sexual intercourse. This pill can be taken right after sex or up to 5 days after unprotected sex. It is most effective the sooner you take the pills after having sexual intercourse. Most emergency contraceptive pills are available without a prescription. Check with your pharmacist. Do not use emergency contraception as your only form of birth control. BARRIER METHODS   Female condom. This is a thin sheath (latex or rubber) that is worn over the penis during sexual intercourse. It can be used with spermicide to increase effectiveness.  Female condom. This is a soft, loose-fitting sheath that is put into the vagina before sexual intercourse.  Diaphragm. This is a soft, latex, dome-shaped barrier that must be fitted by a health care provider. It is inserted into the vagina, along with a spermicidal jelly. It is inserted before intercourse. The diaphragm should be left in the vagina for 6 to 8 hours after intercourse.  Cervical cap. This is a round, soft, latex or plastic cup that fits over the cervix and must be fitted by a health care provider. The cap can be left in place for up to 48 hours after intercourse.  Sponge. This is a soft, circular piece of polyurethane foam. The sponge has spermicide in it. It is inserted into the vagina after wetting it and before sexual intercourse.  Spermicides. These are chemicals that kill or block sperm from entering the cervix and uterus. They come in the form of  creams, jellies, suppositories, foam, or tablets. They do not require a prescription. They are inserted into the vagina with an applicator before having sexual intercourse. The process must be repeated every time you have sexual intercourse. INTRAUTERINE CONTRACEPTION  Intrauterine device (IUD). This is a T-shaped device that is put in a woman's uterus during a menstrual period to prevent pregnancy. There are 2 types:  Copper IUD. This type of IUD is wrapped in copper wire and is placed inside the uterus. Copper makes the uterus and fallopian tubes produce a fluid that kills sperm. It can stay in place for 10 years.  Hormone IUD. This type of IUD contains the hormone progestin (synthetic progesterone). The hormone thickens the cervical mucus and prevents sperm from  entering the uterus, and it also thins the uterine lining to prevent implantation of a fertilized egg. The hormone can weaken or kill the sperm that get into the uterus. It can stay in place for 3-5 years, depending on which type of IUD is used. PERMANENT METHODS OF CONTRACEPTION  Female tubal ligation. This is when the woman's fallopian tubes are surgically sealed, tied, or blocked to prevent the egg from traveling to the uterus.  Hysteroscopic sterilization. This involves placing a small coil or insert into each fallopian tube. Your doctor uses a technique called hysteroscopy to do the procedure. The device causes scar tissue to form. This results in permanent blockage of the fallopian tubes, so the sperm cannot fertilize the egg. It takes about 3 months after the procedure for the tubes to become blocked. You must use another form of birth control for these 3 months.  Female sterilization. This is when the female has the tubes that carry sperm tied off (vasectomy).This blocks sperm from entering the vagina during sexual intercourse. After the procedure, the man can still ejaculate fluid (semen). NATURAL PLANNING METHODS  Natural family  planning. This is not having sexual intercourse or using a barrier method (condom, diaphragm, cervical cap) on days the woman could become pregnant.  Calendar method. This is keeping track of the length of each menstrual cycle and identifying when you are fertile.  Ovulation method. This is avoiding sexual intercourse during ovulation.  Symptothermal method. This is avoiding sexual intercourse during ovulation, using a thermometer and ovulation symptoms.  Post-ovulation method. This is timing sexual intercourse after you have ovulated. Regardless of which type or method of contraception you choose, it is important that you use condoms to protect against the transmission of sexually transmitted infections (STIs). Talk with your health care provider about which form of contraception is most appropriate for you. Document Released: 05/17/2005 Document Revised: 05/22/2013 Document Reviewed: 11/09/2012 [facility] Patient Information 2015 [place], [place]. This information is not intended to replace advice given to you by your health care provider. Make sure you discuss any questions you have with your health care provider.  Breastfeeding Deciding to breastfeed is one of the best choices you can make for you and your baby. A change in hormones during pregnancy causes your breast tissue to grow and increases the number and size of your milk ducts. These hormones also allow proteins, sugars, and fats from your blood supply to make breast milk in your milk-producing glands. Hormones prevent breast milk from being released before your baby is born as well as prompt milk flow after birth. Once breastfeeding has begun, thoughts of your baby, as well as his or her sucking or crying, can stimulate the release of milk from your milk-producing glands.  BENEFITS OF BREASTFEEDING For Your Baby  Your first milk (colostrum) helps your baby's digestive system function better.   There are antibodies in your milk that  help your baby fight off infections.   Your baby has a lower incidence of asthma, allergies, and sudden infant death syndrome.   The nutrients in breast milk are better for your baby than infant formulas and are designed uniquely for your baby's needs.   Breast milk improves your baby's brain development.   Your baby is less likely to develop other conditions, such as childhood obesity, asthma, or type 2 diabetes mellitus.  For You   Breastfeeding helps to create a very special bond between you and your baby.   Breastfeeding is convenient. Breast milk is  always available at the correct temperature and costs nothing.   Breastfeeding helps to burn calories and helps you lose the weight gained during pregnancy.   Breastfeeding makes your uterus contract to its prepregnancy size faster and slows bleeding (lochia) after you give birth.   Breastfeeding helps to lower your risk of developing type 2 diabetes mellitus, osteoporosis, and breast or ovarian cancer later in life. SIGNS THAT YOUR BABY IS HUNGRY Early Signs of Hunger  Increased alertness or activity.  Stretching.  Movement of the head from side to side.  Movement of the head and opening of the mouth when the corner of the mouth or cheek is stroked (rooting).  Increased sucking sounds, smacking lips, cooing, sighing, or squeaking.  Hand-to-mouth movements.  Increased sucking of fingers or hands. Late Signs of Hunger  Fussing.  Intermittent crying. Extreme Signs of Hunger Signs of extreme hunger will require calming and consoling before your baby will be able to breastfeed successfully. Do not wait for the following signs of extreme hunger to occur before you initiate breastfeeding:   Restlessness.  A loud, strong cry.   Screaming. BREASTFEEDING BASICS Breastfeeding Initiation  Find a comfortable place to sit or lie down, with your neck and back well supported.  Place a pillow or rolled up blanket  under your baby to bring him or her to the level of your breast (if you are seated). Nursing pillows are specially designed to help support your arms and your baby while you breastfeed.  Make sure that your baby's abdomen is facing your abdomen.   Gently massage your breast. With your fingertips, massage from your chest wall toward your nipple in a circular motion. This encourages milk flow. You may need to continue this action during the feeding if your milk flows slowly.  Support your breast with 4 fingers underneath and your thumb above your nipple. Make sure your fingers are well away from your nipple and your baby's mouth.   Stroke your baby's lips gently with your finger or nipple.   When your baby's mouth is open wide enough, quickly bring your baby to your breast, placing your entire nipple and as much of the colored area around your nipple (areola) as possible into your baby's mouth.   More areola should be visible above your baby's upper lip than below the lower lip.   Your baby's tongue should be between his or her lower gum and your breast.   Ensure that your baby's mouth is correctly positioned around your nipple (latched). Your baby's lips should create a seal on your breast and be turned out (everted).  It is common for your baby to suck about 2-3 minutes in order to start the flow of breast milk. Latching Teaching your baby how to latch on to your breast properly is very important. An improper latch can cause nipple pain and decreased milk supply for you and poor weight gain in your baby. Also, if your baby is not latched onto your nipple properly, he or she may swallow some air during feeding. This can make your baby fussy. Burping your baby when you switch breasts during the feeding can help to get rid of the air. However, teaching your baby to latch on properly is still the best way to prevent fussiness from swallowing air while breastfeeding. Signs that your baby has  successfully latched on to your nipple:    Silent tugging or silent sucking, without causing you pain.   Swallowing heard between every 3-4  sucks.    Muscle movement above and in front of his or her ears while sucking.  Signs that your baby has not successfully latched on to nipple:   Sucking sounds or smacking sounds from your baby while breastfeeding.  Nipple pain. If you think your baby has not latched on correctly, slip your finger into the corner of your baby's mouth to break the suction and place it between your baby's gums. Attempt breastfeeding initiation again. Signs of Successful Breastfeeding Signs from your baby:   A gradual decrease in the number of sucks or complete cessation of sucking.   Falling asleep.   Relaxation of his or her body.   Retention of a small amount of milk in his or her mouth.   Letting go of your breast by himself or herself. Signs from you:  Breasts that have increased in firmness, weight, and size 1-3 hours after feeding.   Breasts that are softer immediately after breastfeeding.  Increased milk volume, as well as a change in milk consistency and color by the fifth day of breastfeeding.   Nipples that are not sore, cracked, or bleeding. Signs That Your [employer] is Getting Enough Milk  Wetting at least 3 diapers in a 24-hour period. The urine should be clear and pale yellow by age 63 days.  At least 3 stools in a 24-hour period by age 63 days. The stool should be soft and yellow.  At least 3 stools in a 24-hour period by age 34 days. The stool should be seedy and yellow.  No loss of weight greater than 10% of birth weight during the first 12 days of age.  Average weight gain of 4-7 ounces (113-198 g) per week after age 83 days.  Consistent daily weight gain by age 1 days, without weight loss after the age of 2 weeks. After a feeding, your baby may spit up a small amount. This is common. BREASTFEEDING FREQUENCY AND DURATION Frequent  feeding will help you make more milk and can prevent sore nipples and breast engorgement. Breastfeed when you feel the need to reduce the fullness of your breasts or when your baby shows signs of hunger. This is called "breastfeeding on demand." Avoid introducing a pacifier to your baby while you are working to establish breastfeeding (the first 4-6 weeks after your baby is born). After this time you may choose to use a pacifier. Research has shown that pacifier use during the first year of a baby's life decreases the risk of sudden infant death syndrome (SIDS). Allow your baby to feed on each breast as long as he or she wants. Breastfeed until your baby is finished feeding. When your baby unlatches or falls asleep while feeding from the first breast, offer the second breast. Because newborns are often sleepy in the first few weeks of life, you may need to awaken your baby to get him or her to feed. Breastfeeding times will vary from baby to baby. However, the following rules can serve as a guide to help you ensure that your baby is properly fed:  Newborns (babies 27 weeks of age or younger) may breastfeed every 1-3 hours.  Newborns should not go longer than 3 hours during the day or 5 hours during the night without breastfeeding.  You should breastfeed your baby a minimum of 8 times in a 24-hour period until you begin to introduce solid foods to your baby at around 61 months of age. BREAST MILK PUMPING Pumping and storing breast milk allows  you to ensure that your baby is exclusively fed your breast milk, even at times when you are unable to breastfeed. This is especially important if you are going back to work while you are still breastfeeding or when you are not able to be present during feedings. Your lactation consultant can give you guidelines on how long it is safe to store breast milk.  A breast pump is a machine that allows you to pump milk from your breast into a sterile bottle. The pumped breast  milk can then be stored in a refrigerator or freezer. Some breast pumps are operated by hand, while others use electricity. Ask your lactation consultant which type will work best for you. Breast pumps can be purchased, but some hospitals and breastfeeding support groups lease breast pumps on a monthly basis. A lactation consultant can teach you how to hand express breast milk, if you prefer not to use a pump.  CARING FOR YOUR BREASTS WHILE YOU BREASTFEED Nipples can become dry, cracked, and sore while breastfeeding. The following recommendations can help keep your breasts moisturized and healthy:  Avoid using soap on your nipples.   Wear a supportive bra. Although not required, special nursing bras and tank tops are designed to allow access to your breasts for breastfeeding without taking off your entire bra or top. Avoid wearing underwire-style bras or extremely tight bras.  Air dry your nipples for 3-56minutes after each feeding.   Use only cotton bra pads to absorb leaked breast milk. Leaking of breast milk between feedings is normal.   Use lanolin on your nipples after breastfeeding. Lanolin helps to maintain your skin's normal moisture barrier. If you use pure lanolin, you do not need to wash it off before feeding your baby again. Pure lanolin is not toxic to your baby. You may also hand express a few drops of breast milk and gently massage that milk into your nipples and allow the milk to air dry. In the first few weeks after giving birth, some women experience extremely full breasts (engorgement). Engorgement can make your breasts feel heavy, warm, and tender to the touch. Engorgement peaks within 3-5 days after you give birth. The following recommendations can help ease engorgement:  Completely empty your breasts while breastfeeding or pumping. You may want to start by applying warm, moist heat (in the shower or with warm water-soaked hand towels) just before feeding or pumping. This  increases circulation and helps the milk flow. If your baby does not completely empty your breasts while breastfeeding, pump any extra milk after he or she is finished.  Wear a snug bra (nursing or regular) or tank top for 1-2 days to signal your body to slightly decrease milk production.  Apply ice packs to your breasts, unless this is too uncomfortable for you.  Make sure that your baby is latched on and positioned properly while breastfeeding. If engorgement persists after 48 hours of following these recommendations, contact your health care provider or a Science writer. OVERALL HEALTH CARE RECOMMENDATIONS WHILE BREASTFEEDING  Eat healthy foods. Alternate between meals and snacks, eating 3 of each per day. Because what you eat affects your breast milk, some of the foods may make your baby more irritable than usual. Avoid eating these foods if you are sure that they are negatively affecting your baby.  Drink milk, fruit juice, and water to satisfy your thirst (about 10 glasses a day).   Rest often, relax, and continue to take your prenatal vitamins to prevent fatigue,  stress, and anemia.  Continue breast self-awareness checks.  Avoid chewing and smoking tobacco.  Avoid alcohol and drug use. Some medicines that may be harmful to your baby can pass through breast milk. It is important to ask your health care provider before taking any medicine, including all over-the-counter and prescription medicine as well as vitamin and herbal supplements. It is possible to become pregnant while breastfeeding. If birth control is desired, ask your health care provider about options that will be safe for your baby. SEEK MEDICAL CARE IF:   You feel like you want to stop breastfeeding or have become frustrated with breastfeeding.  You have painful breasts or nipples.  Your nipples are cracked or bleeding.  Your breasts are red, tender, or warm.  You have a swollen area on either breast.  You  have a fever or chills.  You have nausea or vomiting.  You have drainage other than breast milk from your nipples.  Your breasts do not become full before feedings by the fifth day after you give birth.  You feel sad and depressed.  Your baby is too sleepy to eat well.  Your baby is having trouble sleeping.   Your baby is wetting less than 3 diapers in a 24-hour period.  Your baby has less than 3 stools in a 24-hour period.  Your baby's skin or the white part of his or her eyes becomes yellow.   Your baby is not gaining weight by 58 days of age. SEEK IMMEDIATE MEDICAL CARE IF:   Your baby is overly tired (lethargic) and does not want to wake up and feed.  Your baby develops an unexplained fever. Document Released: 05/17/2005 Document Revised: 05/22/2013 Document Reviewed: 11/08/2012 [facility] Patient Information 2015 [place], [place]. This information is not intended to replace advice given to you by your health care provider. Make sure you discuss any questions you have with your health care provider.

## 2014-05-06 NOTE — Progress Notes (Signed)
Subjective:    Ann Robertson is a [id number] [redacted]w[redacted]d being seen today for her first obstetrical visit.  Her obstetrical history is significant for history of preterm deliver, history of 2 previous cesarean section, late onset to prenatal care, type 1 DM, short interval between pregnancy (RCS in 07/2013). Patient does not intend to breast feed. Pregnancy history fully reviewed.  Patient reports no complaints.  Filed Vitals:   05/06/14 1001  BP: 128/79  Pulse: 88  Weight: 150 lb 8 oz (68.266 kg)    HISTORY: OB History  Gravida Para Term Preterm AB SAB TAB Ectopic Multiple Living  4 2 0 2 1 1 0 0 0 2     # Outcome Date GA Lbr Len/2nd Weight Sex Delivery Anes PTL Lv  4 Current           3 Preterm 07/05/13 [redacted]w[redacted]d  6 lb 7.9 oz (2.945 kg) M CS-Vac Spinal  Y  2 SAB 2014          1 Preterm 04/17/06 [redacted]w[redacted]d 14:00 5 lb 14 oz (2.665 kg) M CS-Unspec Spinal Y Y     Comments: failure to progess; type 1 diabetes     Past Medical History  Diagnosis Date  . Diabetes mellitus   . Juvenile diabetes mellitus   . Juvenile diabetes 2000  . Neuropathy   . High cholesterol   . HSV-2 infection   . STD (sexually transmitted disease)    Past Surgical History  Procedure Laterality Date  . Cesarean section    . Wisdom tooth extraction    . Cesarean section N/A 07/05/2013    Procedure: CESAREAN SECTION;  Surgeon: Emily Filbert, MD;  Location: [facility];  Service: Obstetrics;  Laterality: N/A;   Family History  Problem Relation Age of Onset  . Stroke Mother   . Hypertension Mother   . Heart disease Mother   . Kidney disease Mother   . Hyperlipidemia Father   . Diabetes Father      Exam    Uterus:     Pelvic Exam:    Perineum: No Hemorrhoids   Vulva: normal   Vagina:  normal mucosa, normal discharge   pH:    Cervix: multiparous appearance   Adnexa: not evaluated   Bony Pelvis: gynecoid  System: Breast:  normal appearance, no masses or tenderness   Skin: normal coloration and turgor, no  rashes    Neurologic: oriented, no focal deficits   Extremities: normal strength, tone, and muscle mass   HEENT extra ocular movement intact   Mouth/Teeth mucous membranes moist, pharynx normal without lesions and dental hygiene good   Neck supple and no masses   Cardiovascular: regular rate and rhythm   Respiratory:  chest clear, no wheezing, crepitations, rhonchi, normal symmetric air entry   Abdomen: soft, gravid, NT   Urinary:       Assessment:    Pregnancy: [id number] Patient Active Problem List   Diagnosis Date Noted  . Supervision of high risk pregnancy, antepartum 05/06/2014    Priority: Medium  . Short interval between pregnancies affecting pregnancy, antepartum 05/06/2014    Priority: Medium  . Previous cesarean section complicating pregnancy, antepartum condition or complication [id number]    Priority: Medium  . History of preterm delivery, currently pregnant 05/06/2014    Priority: Medium  . Late prenatal care 05/06/2014    Priority: Medium  . Type 1 diabetes mellitus complicating pregnancy, antepartum     Priority: Medium  . Encounter for routine screening for  malformation using ultrasonics   . [redacted] weeks gestation of pregnancy   . Type 1 diabetes mellitus with neurological manifestations 12/06/2013  . Post-operative state 07/05/2013  . BORDERLINE macrosomia affecting management of mother in third trimester 07/02/2013  . Pneumonia 05/19/2013  . Influenza with pneumonia 05/19/2013  . Abnormal genetic test in pregnancy 05/09/2013  . Supervision of other high-risk pregnancy 05/09/2013  . Previous cesarean delivery, antepartum condition or complication [id number]  . UTI (urinary tract infection) in pregnancy in first trimester 01/14/2013  . Chlamydia infection complicating pregnancy in first trimester 01/11/2013  . History of preterm delivery, currently pregnant in first trimester 01/08/2013  . Diabetic peripheral neuropathy associated with type 1 diabetes mellitus  01/08/2013        Plan:     Initial labs drawn. Prenatal vitamins. Problem list reviewed and updated. Genetic Screening discussed Quad Screen: ordered.  Ultrasound discussed; fetal survey: results reviewed. Discussed initiation of 17-P- Patient desires Will schedule fetal echo Will obtain baseline labs Patient undecided on contraception- considering depo-provera Patient is scheduled to see endocrinologist tomorrow. She has not been checking CBGs. Reminded patient of glycemic goals during pregnancy. Patient to bring meter at every visit  Follow up in 1 weeks. 50% of 30 min visit spent on counseling and coordination of care.     Ryer Asato 05/06/2014

## 2014-05-07 ENCOUNTER — Encounter: Payer: Self-pay | Admitting: Obstetrics and Gynecology

## 2014-05-07 LAB — PRENATAL PROFILE (SOLSTAS)
Antibody Screen: NEGATIVE
BASOS ABS: 0 10*3/uL (ref 0.0–0.1)
BASOS PCT: 0 % (ref 0–1)
Eosinophils Absolute: 0.2 10*3/uL (ref 0.0–0.7)
Eosinophils Relative: 2 % (ref 0–5)
HEMATOCRIT: 34.3 % — AB (ref 36.0–46.0)
HEP B S AG: NEGATIVE
HIV: NONREACTIVE
Hemoglobin: 12.2 g/dL (ref 12.0–15.0)
Lymphocytes Relative: 24 % (ref 12–46)
Lymphs Abs: 1.9 10*3/uL (ref 0.7–4.0)
MCH: 31.1 pg (ref 26.0–34.0)
MCHC: 35.6 g/dL (ref 30.0–36.0)
MCV: 87.5 fL (ref 78.0–100.0)
MPV: 10 fL (ref 9.4–12.4)
Monocytes Absolute: 0.5 10*3/uL (ref 0.1–1.0)
Monocytes Relative: 6 % (ref 3–12)
NEUTROS ABS: 5.4 10*3/uL (ref 1.7–7.7)
Neutrophils Relative %: 68 % (ref 43–77)
PLATELETS: 294 10*3/uL (ref 150–400)
RBC: 3.92 MIL/uL (ref 3.87–5.11)
RDW: 13.2 % (ref 11.5–15.5)
Rh Type: POSITIVE
Rubella: 1.51 Index — ABNORMAL HIGH (ref <0.90)
WBC: 7.9 10*3/uL (ref 4.0–10.5)

## 2014-05-08 LAB — PRESCRIPTION MONITORING PROFILE (19 PANEL)
AMPHETAMINE/METH: NEGATIVE ng/mL
BARBITURATE SCREEN, URINE: NEGATIVE ng/mL
BUPRENORPHINE, URINE: NEGATIVE ng/mL
Benzodiazepine Screen, Urine: NEGATIVE ng/mL
Cannabinoid Scrn, Ur: NEGATIVE ng/mL
Carisoprodol, Urine: NEGATIVE ng/mL
Cocaine Metabolites: NEGATIVE ng/mL
Creatinine, Urine: 99.91 mg/dL (ref >20.0)
Fentanyl, Ur: NEGATIVE ng/mL
MDMA URINE: NEGATIVE ng/mL
MEPERIDINE UR: NEGATIVE ng/mL
Methadone Screen, Urine: NEGATIVE ng/mL
Methaqualone: NEGATIVE ng/mL
NITRITES URINE, INITIAL: NEGATIVE ug/mL
OPIATE SCREEN, URINE: NEGATIVE ng/mL
Oxycodone Screen, Ur: NEGATIVE ng/mL
PH URINE, INITIAL: 7.2 pH (ref 4.5–8.9)
PROPOXYPHENE: NEGATIVE ng/mL
Phencyclidine, Ur: NEGATIVE ng/mL
TAPENTADOLUR: NEGATIVE ng/mL
TRAMADOL UR: NEGATIVE ng/mL
ZOLPIDEM, URINE: NEGATIVE ng/mL

## 2014-05-08 LAB — COMPREHENSIVE METABOLIC PANEL
ALT: 8 U/L (ref 0–35)
AST: 13 U/L (ref 0–37)
Albumin: 3.2 g/dL — ABNORMAL LOW (ref 3.5–5.2)
Alkaline Phosphatase: 55 U/L (ref 39–117)
BILIRUBIN TOTAL: 0.5 mg/dL (ref 0.2–1.2)
BUN: 6 mg/dL (ref 6–23)
CO2: 19 meq/L (ref 19–32)
Calcium: 8.4 mg/dL (ref 8.4–10.5)
Chloride: 101 mEq/L (ref 96–112)
Creat: 0.66 mg/dL (ref 0.50–1.10)
Glucose, Bld: 155 mg/dL — ABNORMAL HIGH (ref 70–99)
Potassium: 3.9 mEq/L (ref 3.5–5.3)
SODIUM: 134 meq/L — AB (ref 135–145)
TOTAL PROTEIN: 6.4 g/dL (ref 6.0–8.3)

## 2014-05-08 LAB — CYTOLOGY - PAP

## 2014-05-08 LAB — CULTURE, OB URINE
COLONY COUNT: NO GROWTH
ORGANISM ID, BACTERIA: NO GROWTH

## 2014-05-09 ENCOUNTER — Ambulatory Visit: Payer: Medicaid Other | Admitting: Endocrinology

## 2014-05-09 LAB — PROTEIN, URINE, 24 HOUR
PROTEIN 24H UR: 658 mg/d — AB (ref <150)
PROTEIN, URINE: 47 mg/dL — AB (ref 5–24)

## 2014-05-13 ENCOUNTER — Encounter: Payer: Self-pay | Admitting: *Deleted

## 2014-05-13 ENCOUNTER — Ambulatory Visit (INDEPENDENT_AMBULATORY_CARE_PROVIDER_SITE_OTHER): Payer: Medicaid Other | Admitting: Family Medicine

## 2014-05-13 ENCOUNTER — Telehealth: Payer: Self-pay | Admitting: *Deleted

## 2014-05-13 ENCOUNTER — Encounter: Payer: Medicaid Other | Admitting: Obstetrics & Gynecology

## 2014-05-13 ENCOUNTER — Telehealth: Payer: Self-pay

## 2014-05-13 ENCOUNTER — Encounter: Payer: Self-pay | Admitting: Family Medicine

## 2014-05-13 VITALS — BP 124/86 | HR 100 | Temp 98.3°F | Wt 151.7 lb

## 2014-05-13 DIAGNOSIS — O09212 Supervision of pregnancy with history of pre-term labor, second trimester: Secondary | ICD-10-CM

## 2014-05-13 DIAGNOSIS — O09892 Supervision of other high risk pregnancies, second trimester: Secondary | ICD-10-CM

## 2014-05-13 DIAGNOSIS — O24012 Pre-existing diabetes mellitus, type 1, in pregnancy, second trimester: Secondary | ICD-10-CM

## 2014-05-13 DIAGNOSIS — O09219 Supervision of pregnancy with history of pre-term labor, unspecified trimester: Secondary | ICD-10-CM

## 2014-05-13 DIAGNOSIS — E1021 Type 1 diabetes mellitus with diabetic nephropathy: Secondary | ICD-10-CM | POA: Insufficient documentation

## 2014-05-13 DIAGNOSIS — O24019 Pre-existing diabetes mellitus, type 1, in pregnancy, unspecified trimester: Secondary | ICD-10-CM

## 2014-05-13 DIAGNOSIS — O0992 Supervision of high risk pregnancy, unspecified, second trimester: Secondary | ICD-10-CM

## 2014-05-13 DIAGNOSIS — O3421 Maternal care for scar from previous cesarean delivery: Secondary | ICD-10-CM

## 2014-05-13 LAB — POCT URINALYSIS DIP (DEVICE)
BILIRUBIN URINE: NEGATIVE
Glucose, UA: 500 mg/dL — AB
Ketones, ur: NEGATIVE mg/dL
Leukocytes, UA: NEGATIVE
Nitrite: NEGATIVE
Protein, ur: NEGATIVE mg/dL
Specific Gravity, Urine: 1.01 (ref 1.005–1.030)
Urobilinogen, UA: 0.2 mg/dL (ref 0.0–1.0)
pH: 7 (ref 5.0–8.0)

## 2014-05-13 MED ORDER — ASPIRIN 81 MG PO CHEW: 81.0000 mg | CHEWABLE_TABLET | Freq: Every day | ORAL | Status: DC

## 2014-05-13 NOTE — Patient Instructions (Addendum)
Breastfeeding Deciding to breastfeed is one of the best choices you can make for you and your baby. A change in hormones during pregnancy causes your breast tissue to grow and increases the number and size of your milk ducts. These hormones also allow proteins, sugars, and fats from your blood supply to make breast milk in your milk-producing glands. Hormones prevent breast milk from being released before your baby is born as well as prompt milk flow after birth. Once breastfeeding has begun, thoughts of your baby, as well as his or her sucking or crying, can stimulate the release of milk from your milk-producing glands.  BENEFITS OF BREASTFEEDING For Your Baby  Your first milk (colostrum) helps your baby's digestive system function better.   There are antibodies in your milk that help your baby fight off infections.   Your baby has a lower incidence of asthma, allergies, and sudden infant death syndrome.   The nutrients in breast milk are better for your baby than infant formulas and are designed uniquely for your baby's needs.   Breast milk improves your baby's brain development.   Your baby is less likely to develop other conditions, such as childhood obesity, asthma, or type 2 diabetes mellitus.  For You   Breastfeeding helps to create a very special bond between you and your baby.   Breastfeeding is convenient. Breast milk is always available at the correct temperature and costs nothing.   Breastfeeding helps to burn calories and helps you lose the weight gained during pregnancy.   Breastfeeding makes your uterus contract to its prepregnancy size faster and slows bleeding (lochia) after you give birth.   Breastfeeding helps to lower your risk of developing type 2 diabetes mellitus, osteoporosis, and breast or ovarian cancer later in life. SIGNS THAT YOUR BABY IS HUNGRY Early Signs of Hunger  Increased alertness or activity.  Stretching.  Movement of the head from  side to side.  Movement of the head and opening of the mouth when the corner of the mouth or cheek is stroked (rooting).  Increased sucking sounds, smacking lips, cooing, sighing, or squeaking.  Hand-to-mouth movements.  Increased sucking of fingers or hands. Late Signs of Hunger  Fussing.  Intermittent crying. Extreme Signs of Hunger Signs of extreme hunger will require calming and consoling before your baby will be able to breastfeed successfully. Do not wait for the following signs of extreme hunger to occur before you initiate breastfeeding:   Restlessness.  A loud, strong cry.   Screaming. BREASTFEEDING BASICS Breastfeeding Initiation  Find a comfortable place to sit or lie down, with your neck and back well supported.  Place a pillow or rolled up blanket under your baby to bring him or her to the level of your breast (if you are seated). Nursing pillows are specially designed to help support your arms and your baby while you breastfeed.  Make sure that your baby's abdomen is facing your abdomen.   Gently massage your breast. With your fingertips, massage from your chest wall toward your nipple in a circular motion. This encourages milk flow. You may need to continue this action during the feeding if your milk flows slowly.  Support your breast with 4 fingers underneath and your thumb above your nipple. Make sure your fingers are well away from your nipple and your baby's mouth.   Stroke your baby's lips gently with your finger or nipple.   When your baby's mouth is open wide enough, quickly bring your baby to your  breast, placing your entire nipple and as much of the colored area around your nipple (areola) as possible into your baby's mouth.   More areola should be visible above your baby's upper lip than below the lower lip.   Your baby's tongue should be between his or her lower gum and your breast.   Ensure that your baby's mouth is correctly positioned  around your nipple (latched). Your baby's lips should create a seal on your breast and be turned out (everted).  It is common for your baby to suck about 2-3 minutes in order to start the flow of breast milk. Latching Teaching your baby how to latch on to your breast properly is very important. An improper latch can cause nipple pain and decreased milk supply for you and poor weight gain in your baby. Also, if your baby is not latched onto your nipple properly, he or she may swallow some air during feeding. This can make your baby fussy. Burping your baby when you switch breasts during the feeding can help to get rid of the air. However, teaching your baby to latch on properly is still the best way to prevent fussiness from swallowing air while breastfeeding. Signs that your baby has successfully latched on to your nipple:    Silent tugging or silent sucking, without causing you pain.   Swallowing heard between every 3-4 sucks.    Muscle movement above and in front of his or her ears while sucking.  Signs that your baby has not successfully latched on to nipple:   Sucking sounds or smacking sounds from your baby while breastfeeding.  Nipple pain. If you think your baby has not latched on correctly, slip your finger into the corner of your baby's mouth to break the suction and place it between your baby's gums. Attempt breastfeeding initiation again. Signs of Successful Breastfeeding Signs from your baby:   A gradual decrease in the number of sucks or complete cessation of sucking.   Falling asleep.   Relaxation of his or her body.   Retention of a small amount of milk in his or her mouth.   Letting go of your breast by himself or herself. Signs from you:  Breasts that have increased in firmness, weight, and size 1-3 hours after feeding.   Breasts that are softer immediately after breastfeeding.  Increased milk volume, as well as a change in milk consistency and color by  the fifth day of breastfeeding.   Nipples that are not sore, cracked, or bleeding. Signs That Your [employer] is Getting Enough Milk  Wetting at least 3 diapers in a 24-hour period. The urine should be clear and pale yellow by age 11 days.  At least 3 stools in a 24-hour period by age 11 days. The stool should be soft and yellow.  At least 3 stools in a 24-hour period by age 18 days. The stool should be seedy and yellow.  No loss of weight greater than 10% of birth weight during the first 55 days of age.  Average weight gain of 4-7 ounces (113-198 g) per week after age 36 days.  Consistent daily weight gain by age 9 days, without weight loss after the age of 2 weeks. After a feeding, your baby may spit up a small amount. This is common. BREASTFEEDING FREQUENCY AND DURATION Frequent feeding will help you make more milk and can prevent sore nipples and breast engorgement. Breastfeed when you feel the need to reduce the fullness of your breasts  or when your baby shows signs of hunger. This is called "breastfeeding on demand." Avoid introducing a pacifier to your baby while you are working to establish breastfeeding (the first 4-6 weeks after your baby is born). After this time you may choose to use a pacifier. Research has shown that pacifier use during the first year of a baby's life decreases the risk of sudden infant death syndrome (SIDS). Allow your baby to feed on each breast as long as he or she wants. Breastfeed until your baby is finished feeding. When your baby unlatches or falls asleep while feeding from the first breast, offer the second breast. Because newborns are often sleepy in the first few weeks of life, you may need to awaken your baby to get him or her to feed. Breastfeeding times will vary from baby to baby. However, the following rules can serve as a guide to help you ensure that your baby is properly fed:  Newborns (babies 5 weeks of age or younger) may breastfeed every 1-3  hours.  Newborns should not go longer than 3 hours during the day or 5 hours during the night without breastfeeding.  You should breastfeed your baby a minimum of 8 times in a 24-hour period until you begin to introduce solid foods to your baby at around 54 months of age. BREAST MILK PUMPING Pumping and storing breast milk allows you to ensure that your baby is exclusively fed your breast milk, even at times when you are unable to breastfeed. This is especially important if you are going back to work while you are still breastfeeding or when you are not able to be present during feedings. Your lactation consultant can give you guidelines on how long it is safe to store breast milk.  A breast pump is a machine that allows you to pump milk from your breast into a sterile bottle. The pumped breast milk can then be stored in a refrigerator or freezer. Some breast pumps are operated by hand, while others use electricity. Ask your lactation consultant which type will work best for you. Breast pumps can be purchased, but some hospitals and breastfeeding support groups lease breast pumps on a monthly basis. A lactation consultant can teach you how to hand express breast milk, if you prefer not to use a pump.  CARING FOR YOUR BREASTS WHILE YOU BREASTFEED Nipples can become dry, cracked, and sore while breastfeeding. The following recommendations can help keep your breasts moisturized and healthy:  Avoid using soap on your nipples.   Wear a supportive bra. Although not required, special nursing bras and tank tops are designed to allow access to your breasts for breastfeeding without taking off your entire bra or top. Avoid wearing underwire-style bras or extremely tight bras.  Air dry your nipples for 3-98minutes after each feeding.   Use only cotton bra pads to absorb leaked breast milk. Leaking of breast milk between feedings is normal.   Use lanolin on your nipples after breastfeeding. Lanolin helps to  maintain your skin's normal moisture barrier. If you use pure lanolin, you do not need to wash it off before feeding your baby again. Pure lanolin is not toxic to your baby. You may also hand express a few drops of breast milk and gently massage that milk into your nipples and allow the milk to air dry. In the first few weeks after giving birth, some women experience extremely full breasts (engorgement). Engorgement can make your breasts feel heavy, warm, and tender to the  touch. Engorgement peaks within 3-5 days after you give birth. The following recommendations can help ease engorgement:  Completely empty your breasts while breastfeeding or pumping. You may want to start by applying warm, moist heat (in the shower or with warm water-soaked hand towels) just before feeding or pumping. This increases circulation and helps the milk flow. If your baby does not completely empty your breasts while breastfeeding, pump any extra milk after he or she is finished.  Wear a snug bra (nursing or regular) or tank top for 1-2 days to signal your body to slightly decrease milk production.  Apply ice packs to your breasts, unless this is too uncomfortable for you.  Make sure that your baby is latched on and positioned properly while breastfeeding. If engorgement persists after 48 hours of following these recommendations, contact your health care provider or a Science writer. OVERALL HEALTH CARE RECOMMENDATIONS WHILE BREASTFEEDING  Eat healthy foods. Alternate between meals and snacks, eating 3 of each per day. Because what you eat affects your breast milk, some of the foods may make your baby more irritable than usual. Avoid eating these foods if you are sure that they are negatively affecting your baby.  Drink milk, fruit juice, and water to satisfy your thirst (about 10 glasses a day).   Rest often, relax, and continue to take your prenatal vitamins to prevent fatigue, stress, and anemia.  Continue  breast self-awareness checks.  Avoid chewing and smoking tobacco.  Avoid alcohol and drug use. Some medicines that may be harmful to your baby can pass through breast milk. It is important to ask your health care provider before taking any medicine, including all over-the-counter and prescription medicine as well as vitamin and herbal supplements. It is possible to become pregnant while breastfeeding. If birth control is desired, ask your health care provider about options that will be safe for your baby. SEEK MEDICAL CARE IF:   You feel like you want to stop breastfeeding or have become frustrated with breastfeeding.  You have painful breasts or nipples.  Your nipples are cracked or bleeding.  Your breasts are red, tender, or warm.  You have a swollen area on either breast.  You have a fever or chills.  You have nausea or vomiting.  You have drainage other than breast milk from your nipples.  Your breasts do not become full before feedings by the fifth day after you give birth.  You feel sad and depressed.  Your baby is too sleepy to eat well.  Your baby is having trouble sleeping.   Your baby is wetting less than 3 diapers in a 24-hour period.  Your baby has less than 3 stools in a 24-hour period.  Your baby's skin or the white part of his or her eyes becomes yellow.   Your baby is not gaining weight by 58 days of age. SEEK IMMEDIATE MEDICAL CARE IF:   Your baby is overly tired (lethargic) and does not want to wake up and feed.  Your baby develops an unexplained fever. Document Released: 05/17/2005 Document Revised: 05/22/2013 Document Reviewed: 11/08/2012 [facility] Patient Information 2015 [place], [place]. This information is not intended to replace advice given to you by your health care provider. Make sure you discuss any questions you have with your health care provider.  Gestational Diabetes Mellitus Gestational diabetes mellitus, often simply referred to as  gestational diabetes, is a type of diabetes that some women develop during pregnancy. In gestational diabetes, the pancreas does not make enough  insulin (a hormone), the cells are less responsive to the insulin that is made (insulin resistance), or both.Normally, insulin moves sugars from food into the tissue cells. The tissue cells use the sugars for energy. The lack of insulin or the lack of normal response to insulin causes excess sugars to build up in the blood instead of going into the tissue cells. As a result, high blood sugar (hyperglycemia) develops. The effect of high sugar (glucose) levels can cause many problems.  RISK FACTORS You have an increased chance of developing gestational diabetes if you have a family history of diabetes and also have one or more of the following risk factors:  A body mass index over 30 (obesity).  A previous pregnancy with gestational diabetes.  An older age at the time of pregnancy. If blood glucose levels are kept in the normal range during pregnancy, women can have a healthy pregnancy. If your blood glucose levels are not well controlled, there may be risks to you, your unborn baby (fetus), your labor and delivery, or your newborn baby.  SYMPTOMS  If symptoms are experienced, they are much like symptoms you would normally expect during pregnancy. The symptoms of gestational diabetes include:   Increased thirst (polydipsia).  Increased urination (polyuria).  Increased urination during the night (nocturia).  Weight loss. This weight loss may be rapid.  Frequent, recurring infections.  Tiredness (fatigue).  Weakness.  Vision changes, such as blurred vision.  Fruity smell to your breath.  Abdominal pain. DIAGNOSIS Diabetes is diagnosed when blood glucose levels are increased. Your blood glucose level may be checked by one or more of the following blood tests:  A fasting blood glucose test. You will not be allowed to eat for at least 8 hours  before a blood sample is taken.  A random blood glucose test. Your blood glucose is checked at any time of the day regardless of when you ate.  A hemoglobin A1c blood glucose test. A hemoglobin A1c test provides information about blood glucose control over the previous 3 months.  An oral glucose tolerance test (OGTT). Your blood glucose is measured after you have not eaten (fasted) for 1-3 hours and then after you drink a glucose-containing beverage. Since the hormones that cause insulin resistance are highest at about 24-28 weeks of a pregnancy, an OGTT is usually performed during that time. If you have risk factors for gestational diabetes, your health care provider may test you for gestational diabetes earlier than 24 weeks of pregnancy. TREATMENT   You will need to take diabetes medicine or insulin daily to keep blood glucose levels in the desired range.  You will need to match insulin dosing with exercise and healthy food choices. The treatment goal is to maintain the before-meal (preprandial), bedtime, and overnight blood glucose level at 60-99 mg/dL during pregnancy. The treatment goal is to further maintain peak after-meal blood sugar (postprandial glucose) level at 100-140 mg/dL. HOME CARE INSTRUCTIONS   Have your hemoglobin A1c level checked twice a year.  Perform daily blood glucose monitoring as directed by your health care provider. It is common to perform frequent blood glucose monitoring.  Monitor urine ketones when you are ill and as directed by your health care provider.  Take your diabetes medicine and insulin as directed by your health care provider to maintain your blood glucose level in the desired range.  Never run out of diabetes medicine or insulin. It is needed every day.  Adjust insulin based on your intake of carbohydrates.  Carbohydrates can raise blood glucose levels but need to be included in your diet. Carbohydrates provide vitamins, minerals, and fiber which  are an essential part of a healthy diet. Carbohydrates are found in fruits, vegetables, whole grains, dairy products, legumes, and foods containing added sugars.  Eat healthy foods. Alternate 3 meals with 3 snacks.  Maintain a healthy weight gain. The usual total expected weight gain varies according to your prepregnancy body mass index (BMI).  Carry a medical alert card or wear your medical alert jewelry.  Carry a 15-gram carbohydrate snack with you at all times to treat low blood glucose (hypoglycemia). Some examples of 15-gram carbohydrate snacks include:  Glucose tablets, 3 or 4.  Glucose gel, 15-gram tube.  Raisins, 2 tablespoons (24 g).  Jelly beans, 6.  Animal crackers, 8.  Fruit juice, regular soda, or low-fat milk, 4 ounces (120 mL).  Gummy treats, 9.  Recognize hypoglycemia. Hypoglycemia during pregnancy occurs with blood glucose levels of 60 mg/dL and below. The risk for hypoglycemia increases when fasting or skipping meals, during or after intense exercise, and during sleep. Hypoglycemia symptoms can include:  Tremors or shakes.  Decreased ability to concentrate.  Sweating.  Increased heart rate.  Headache.  Dry mouth.  Hunger.  Irritability.  Anxiety.  Restless sleep.  Altered speech or coordination.  Confusion.  Treat hypoglycemia promptly. If you are alert and able to safely swallow, follow the 15:15 rule:  Take 15-20 grams of rapid-acting glucose or carbohydrate. Rapid-acting options include glucose gel, glucose tablets, or 4 ounces (120 mL) of fruit juice, regular soda, or low-fat milk.  Check your blood glucose level 15 minutes after taking the glucose.  Take 15-20 grams more of glucose if the repeat blood glucose level is still 70 mg/dL or below.  Eat a meal or snack within 1 hour once blood glucose levels return to normal.  Be alert to polyuria (excess urination) and polydipsia (excess thirst) which are early signs of hyperglycemia. An  early awareness of hyperglycemia allows for prompt treatment. Treat hyperglycemia as directed by your health care provider.  Engage in at least 30 minutes of physical activity a day or as directed by your health care provider. Ten minutes of physical activity timed 30 minutes after each meal is encouraged to control postprandial blood glucose levels.  Adjust your insulin dosing and food intake as needed if you start a new exercise or sport.  Follow your sick-day plan at any time you are unable to eat or drink as usual.  Avoid tobacco and alcohol use.  Keep all follow-up visits as directed by your health care provider.  Follow the advice of your health care provider regarding your prenatal and post-delivery (postpartum) appointments, meal planning, exercise, medicines, vitamins, blood tests, other medical tests, and physical activities.  Perform daily skin and foot care. Examine your skin and feet daily for cuts, bruises, redness, nail problems, bleeding, blisters, or sores.  Brush your teeth and gums at least twice a day and floss at least once a day. Follow up with your dentist regularly.  Schedule an eye exam during the first trimester of your pregnancy or as directed by your health care provider.  Share your diabetes management plan with your workplace or school.  Stay up-to-date with immunizations.  Learn to manage stress.  Obtain ongoing diabetes education and support as needed.  Learn about and consider breastfeeding your baby.  You should have your blood sugar level checked 6-12 weeks after delivery. This is done with  an oral glucose tolerance test (OGTT). SEEK MEDICAL CARE IF:   You are unable to eat food or drink fluids for more than 6 hours.  You have nausea and vomiting for more than 6 hours.  You have a blood glucose level of 200 mg/dL and you have ketones in your urine.  There is a change in mental status.  You develop vision problems.  You have a persistent  headache.  You have upper abdominal pain or discomfort.  You develop an additional serious illness.  You have diarrhea for more than 6 hours.  You have been sick or have had a fever for a couple of days and are not getting better. SEEK IMMEDIATE MEDICAL CARE IF:   You have difficulty breathing.  You no longer feel the baby moving.  You are bleeding or have discharge from your vagina.  You start having premature contractions or labor. MAKE SURE YOU:  Understand these instructions.  Will watch your condition.  Will get help right away if you are not doing well or get worse. Document Released: 08/23/2000 Document Revised: 10/01/2013 Document Reviewed: 12/14/2011 [facility] Patient Information 2015 [place], [place]. This information is not intended to replace advice given to you by your health care provider. Make sure you discuss any questions you have with your health care provider.  Second Trimester of Pregnancy The second trimester is from week 13 through week 28, months 4 through 6. The second trimester is often a time when you feel your best. Your body has also adjusted to being pregnant, and you begin to feel better physically. Usually, morning sickness has lessened or quit completely, you may have more energy, and you may have an increase in appetite. The second trimester is also a time when the fetus is growing rapidly. At the end of the sixth month, the fetus is about 9 inches long and weighs about 1 pounds. You will likely begin to feel the baby move (quickening) between 18 and 20 weeks of the pregnancy. BODY CHANGES Your body goes through many changes during pregnancy. The changes vary from woman to woman.   Your weight will continue to increase. You will notice your lower abdomen bulging out.  You may begin to get stretch marks on your hips, abdomen, and breasts.  You may develop headaches that can be relieved by medicines approved by your health care provider.  You may  urinate more often because the fetus is pressing on your bladder.  You may develop or continue to have heartburn as a result of your pregnancy.  You may develop constipation because certain hormones are causing the muscles that push waste through your intestines to slow down.  You may develop hemorrhoids or swollen, bulging veins (varicose veins).  You may have back pain because of the weight gain and pregnancy hormones relaxing your joints between the bones in your pelvis and as a result of a shift in weight and the muscles that support your balance.  Your breasts will continue to grow and be tender.  Your gums may bleed and may be sensitive to brushing and flossing.  Dark spots or blotches (chloasma, mask of pregnancy) may develop on your face. This will likely fade after the baby is born.  A dark line from your belly button to the pubic area (linea nigra) may appear. This will likely fade after the baby is born.  You may have changes in your hair. These can include thickening of your hair, rapid growth, and changes in texture.  Some women also have hair loss during or after pregnancy, or hair that feels dry or thin. Your hair will most likely return to normal after your baby is born. WHAT TO EXPECT AT YOUR PRENATAL VISITS During a routine prenatal visit:  You will be weighed to make sure you and the fetus are growing normally.  Your blood pressure will be taken.  Your abdomen will be measured to track your baby's growth.  The fetal heartbeat will be listened to.  Any test results from the previous visit will be discussed. Your health care provider may ask you:  How you are feeling.  If you are feeling the baby move.  If you have had any abnormal symptoms, such as leaking fluid, bleeding, severe headaches, or abdominal cramping.  If you have any questions. Other tests that may be performed during your second trimester include:  Blood tests that check for:  Low iron levels  (anemia).  Gestational diabetes (between 24 and 28 weeks).  Rh antibodies.  Urine tests to check for infections, diabetes, or protein in the urine.  An ultrasound to confirm the proper growth and development of the baby.  An amniocentesis to check for possible genetic problems.  Fetal screens for spina bifida and Down syndrome. HOME CARE INSTRUCTIONS   Avoid all smoking, herbs, alcohol, and unprescribed drugs. These chemicals affect the formation and growth of the baby.  Follow your health care provider's instructions regarding medicine use. There are medicines that are either safe or unsafe to take during pregnancy.  Exercise only as directed by your health care provider. Experiencing uterine cramps is a good sign to stop exercising.  Continue to eat regular, healthy meals.  Wear a good support bra for breast tenderness.  Do not use hot tubs, steam rooms, or saunas.  Wear your seat belt at all times when driving.  Avoid raw meat, uncooked cheese, cat litter boxes, and soil used by cats. These carry germs that can cause birth defects in the baby.  Take your prenatal vitamins.  Try taking a stool softener (if your health care provider approves) if you develop constipation. Eat more high-fiber foods, such as fresh vegetables or fruit and whole grains. Drink plenty of fluids to keep your urine clear or pale yellow.  Take warm sitz baths to soothe any pain or discomfort caused by hemorrhoids. Use hemorrhoid cream if your health care provider approves.  If you develop varicose veins, wear support hose. Elevate your feet for 15 minutes, 3-4 times a day. Limit salt in your diet.  Avoid heavy lifting, wear low heel shoes, and practice good posture.  Rest with your legs elevated if you have leg cramps or low back pain.  Visit your dentist if you have not gone yet during your pregnancy. Use a soft toothbrush to brush your teeth and be gentle when you floss.  A sexual relationship  may be continued unless your health care provider directs you otherwise.  Continue to go to all your prenatal visits as directed by your health care provider. SEEK MEDICAL CARE IF:   You have dizziness.  You have mild pelvic cramps, pelvic pressure, or nagging pain in the abdominal area.  You have persistent nausea, vomiting, or diarrhea.  You have a bad smelling vaginal discharge.  You have pain with urination. SEEK IMMEDIATE MEDICAL CARE IF:   You have a fever.  You are leaking fluid from your vagina.  You have spotting or bleeding from your vagina.  You have severe  abdominal cramping or pain.  You have rapid weight gain or loss.  You have shortness of breath with chest pain.  You notice sudden or extreme swelling of your face, hands, ankles, feet, or legs.  You have not felt your baby move in over an hour.  You have severe headaches that do not go away with medicine.  You have vision changes. Document Released: 05/11/2001 Document Revised: 05/22/2013 Document Reviewed: 07/18/2012 [facility] Patient Information 2015 [place], [place]. This information is not intended to replace advice given to you by your health care provider. Make sure you discuss any questions you have with your health care provider.

## 2014-05-13 NOTE — Progress Notes (Signed)
States she has already had flu shot this year--declines flu shot today---She did something similar last year and was subsequently admitted with pneumonia and flu.  Reports continuing to take insulin as directed by Endocrinology on 75/25--has appointment with new Endocrinology this week.  Reports a mix up in last appointment.--typically most diabetic patients are managed by OB's during pregnancy, but she will not allow this. We prefer NPH and Novolog for tighter control during pregnancy--goals are FBS < 90, 2 hour pp < 120--during last pregnancy, she refused this switch and was followed by [facility] Endocrinology and would only allow them to adjust insulin but never had good control and told us that her A1C was good because it was 9 and had been 14.  That is not anywhere near where her glycemic control should be in pregnancy. Advised and offered admission which she refused. Reviewed her labs, discussed A1C of > 11 and her proteinuria--which is new, although she never completed a 24 hour urine last pregnancy. We reviewed carb counting and pre-prandial BS to assess need for meal coverage, she is not doing this. She has no book today.  Again reviewed long term risks of diabetes, poor control, damage to small vessels, including kidney, eyes, nerves, heart. Also discussed fetal risks, she has previously not had a significantly bad outcome related to her DM.  She is very upset that she is being given different due dates, states her papers said 3/30 and we have 3/29--I have reviewed u/s [facility] [place] and other results and printed out result for patient.  Needs to start 17P--it has been ordered--offered her to come tomorrow to begin shots as she is already 25 wks, she states she has no transportation until next week.  Seems very concerned about her fetus during the visit because of her diabetes--Reviewed glycemic control and risk of still birth.  Needs fetal echo and optho  Serial U/S for growth  Start baby ASA to  prevent pre-eclampsia.

## 2014-05-13 NOTE — Progress Notes (Signed)
Fetal Echo 06/03/14 @ 830a.  U/S with Radiology 05/20/14 @ 1245p.  Info given for [facility], will f/u to schedule appt.

## 2014-05-13 NOTE — Telephone Encounter (Signed)
Patient here today for OBFU and 17P. Medication not yet received. [place] and spoke to Jefferson who directed me to call [facility] at [phone]. Called pharmacy and spoke to Sheldon who transferred me to [facility]. [place] informed me they have not yet received the RX but stated she could take a verbal over the phone. 17P called into her. She states she will get it shipped out today and we should receive it in office tomorrow.

## 2014-05-13 NOTE — Progress Notes (Signed)
States was taking percocet and ibuprofen earlier in pregnancy until she found out she was pregnant. Has not taken ibuprofen since 04/23/14.

## 2014-05-13 NOTE — Telephone Encounter (Signed)
Opened in error

## 2014-05-14 ENCOUNTER — Ambulatory Visit (INDEPENDENT_AMBULATORY_CARE_PROVIDER_SITE_OTHER): Payer: Medicaid Other | Admitting: Endocrinology

## 2014-05-14 ENCOUNTER — Telehealth: Payer: Self-pay

## 2014-05-14 ENCOUNTER — Encounter: Payer: Self-pay | Admitting: Endocrinology

## 2014-05-14 ENCOUNTER — Encounter: Payer: Medicaid Other | Attending: Obstetrics & Gynecology | Admitting: Nutrition

## 2014-05-14 VITALS — BP 130/90 | HR 96 | Temp 98.3°F | Ht 65.0 in | Wt 150.0 lb

## 2014-05-14 DIAGNOSIS — Z713 Dietary counseling and surveillance: Secondary | ICD-10-CM | POA: Diagnosis not present

## 2014-05-14 DIAGNOSIS — O24012 Pre-existing diabetes mellitus, type 1, in pregnancy, second trimester: Secondary | ICD-10-CM | POA: Insufficient documentation

## 2014-05-14 DIAGNOSIS — Z794 Long term (current) use of insulin: Secondary | ICD-10-CM | POA: Diagnosis not present

## 2014-05-14 DIAGNOSIS — E1021 Type 1 diabetes mellitus with diabetic nephropathy: Secondary | ICD-10-CM

## 2014-05-14 LAB — TSH: TSH: 1.581 u[IU]/mL (ref 0.350–4.500)

## 2014-05-14 MED ORDER — V-GO 20 KIT: PACK | Status: DC

## 2014-05-14 MED ORDER — INSULIN ASPART 100 UNIT/ML ~~LOC~~ SOLN: SUBCUTANEOUS | Status: DC

## 2014-05-14 MED ORDER — V-GO 20 KIT: 1.0000 | PACK | Freq: Every day | Status: DC

## 2014-05-14 NOTE — Progress Notes (Signed)
Subjective:    Patient ID: Ann Robertson, female    DOB: 06-08-1990, 23 y.o.   MRN: [id number]  HPI  Pt returns for f/u of diabetes mellitus: DM type: 1 Dx'ed: [id number] Complications: polyneuropathy and nephropathy. Therapy: insulin since dx DKA: last episode was in 2008 Severe hypoglycemia: last episode was approx 2012.   Pancreatitis: never.   Other: she took pump rx only during the 2014-2015 pregnancy Interval history: she is overdue for f/u.  Pt is now [redacted] weeks pregnant.  Pt says she never misses the insulin injections.  no cbg record, but states cbg's vary from 98-400.  It is in general higher as the day goes on.  pt states she feels well in general.   Past Medical History  Diagnosis Date  . Diabetes mellitus   . Juvenile diabetes mellitus   . Juvenile diabetes 2000  . Neuropathy   . High cholesterol   . HSV-2 infection   . STD (sexually transmitted disease)     Past Surgical History  Procedure Laterality Date  . Cesarean section    . Wisdom tooth extraction    . Cesarean section N/A 07/05/2013    Procedure: CESAREAN SECTION;  Surgeon: Ann Filbert, MD;  Location: [facility];  Service: Obstetrics;  Laterality: N/A;    History   Social History  . Marital Status: Single    Spouse Name: N/A    Number of Children: N/A  . Years of Education: N/A   Occupational History  . Not on file.   Social History Main Topics  . Smoking status: Former Research scientist (life sciences)  . Smokeless tobacco: Never Used  . Alcohol Use: No  . Drug Use: No  . Sexual Activity: Yes    Birth Control/ Protection: None   Other Topics Concern  . Not on file   Social History Narrative    Current Outpatient Prescriptions on File Prior to Visit  Medication Sig Dispense Refill  . aspirin 81 MG chewable tablet Chew 1 tablet (81 mg total) by mouth daily. 60 tablet 3  . ibuprofen (ADVIL,MOTRIN) 800 MG tablet Take 800 mg by mouth 3 (three) times daily as needed.  0  . Insulin Syringe-Needle U-100 (ADVOCATE INSULIN  SYRINGE) 31G X 5/16" 1 ML MISC Use to administer insulin 100 each 0  . Prenatal Vit-Fe Fumarate-FA (PRENATAL VITAMINS PLUS) 27-1 MG TABS Take 1 tablet by mouth daily. 30 tablet 0   No current facility-administered medications on file prior to visit.    Allergies  Allergen Reactions  . Hydromorphone Hcl Rash    Family History  Problem Relation Age of Onset  . Stroke Mother   . Hypertension Mother   . Heart disease Mother   . Kidney disease Mother   . Hyperlipidemia Father   . Diabetes Father     BP 130/90 mmHg  Pulse 96  Temp(Src) 98.3 F (36.8 C) (Oral)  Ht 5\' 5"  (1.651 m)  Wt 150 lb (68.04 kg)  BMI 24.96 kg/m2  SpO2 95%  LMP  (LMP Unknown)    Review of Systems She denies hypoglycemia. she has gained 20 lbs over the past 5 months.      Objective:   Physical Exam VITAL SIGNS:  See vs page GENERAL: no distress Pulses: dorsalis pedis intact bilat.   Feet: no deformity.  no edema Skin:  no ulcer on the feet.  normal color and temp. Neuro: sensation is intact to touch on the feet   Lab Results  Component Value Date  HGBA1C 11.6* 05/06/2014   i have reviewed the following old records: Office note from Dr Kennon Rounds: pt was noted to have severely elevated a1c and proteinuria.       Assessment & Plan:  DM: severe exacerbation.  We discussed options.  i also discussed with Leonia Reader, RN. Pregnancy: new to me.  Patient is advised the following: Patient Instructions  check your blood sugar 6 times a day.  vary the time of day when you check, between before the 3 meals, after meals on a rotating basis, and at bedtime.  also check if you have symptoms of your blood sugar being too high or too low.  please keep a record of the readings and bring it to your next appointment here.  You can write it on any piece of paper.  please call us sooner if your blood sugar goes below 70, or if you have a lot of readings over 200. Please start taking the V-GO-20.  This gives you 20  units per day basal.  Also please give yourself 2 units 3 times a day (just before each meal), to start. Please come back for a follow-up appointment in 2 days.

## 2014-05-14 NOTE — Patient Instructions (Signed)
check your blood sugar 6 times a day.  vary the time of day when you check, between before the 3 meals, after meals on a rotating basis, and at bedtime.  also check if you have symptoms of your blood sugar being too high or too low.  please keep a record of the readings and bring it to your next appointment here.  You can write it on any piece of paper.  please call us sooner if your blood sugar goes below 70, or if you have a lot of readings over 200. Please start taking the V-GO-20.  This gives you 20 units per day basal.  Also please give yourself 2 units 3 times a day (just before each meal), to start. Please come back for a follow-up appointment in 2 days.

## 2014-05-14 NOTE — Progress Notes (Signed)
This patient and her husband were her to learn about the V-go  She was shown this, and wanted to try this. She then talked to [facility] customer care to start the paperwork needed to get this through Medicaid. She took her 70/30 insulin this AM, and again right before coming here (11AM) She was told that she will no longer take this insulin and use on novolog insulin.  She reported good understanding of this.   We reviewed how to fill apply and use the V-go She was instructed to take 1 set of button presses for every meal, and 1 extra button press when pre-meal blood sugars are over 200, and 2 extra  Button presses when blood sugars are over 300.  She reported good understanding of this. She has been on an insulin pump, but said she did not like it.  She is familiar with carb servings, and counting carbs.  She said that she did not wish more information on this. She was told to test blood sugars before meals, and at bedtime.  She said that she had no strips, and was given some strips to use in her Nano meter. They had no final questions.

## 2014-05-14 NOTE — Patient Instructions (Signed)
Take no other insulin except the V-go Test blood sugars before meals and at bedtime. Call [facility] customer care if questions about how to use the V-go

## 2014-05-14 NOTE — Telephone Encounter (Signed)
Received paper work for pump. Medical supply company is requesting medical necessity letter. Letter needs to state  the pt is pregnant and that the VGO pump would aid in lowering her blood sugar.   Please advise, Thanks!

## 2014-05-14 NOTE — Telephone Encounter (Signed)
i printed 

## 2014-05-15 ENCOUNTER — Telehealth: Payer: Self-pay | Admitting: Nutrition

## 2014-05-15 ENCOUNTER — Telehealth: Payer: Self-pay

## 2014-05-15 NOTE — Telephone Encounter (Signed)
Copy of medical necessity letter, demographic sheet and Rx for V-GO pump sent to [place]. Waiting on response.

## 2014-05-15 NOTE — Telephone Encounter (Signed)
Error

## 2014-05-15 NOTE — Telephone Encounter (Signed)
Please continue the same settings for now. Ov tomorrow am as scheduled.  Please bring cbg record.

## 2014-05-15 NOTE — Telephone Encounter (Signed)
please call patient: Please call us in 1-2 days, to tell us how your blood sugar is doing.

## 2014-05-15 NOTE — Telephone Encounter (Signed)
Pt. Said that blood sugar last night at HS was 237.  She woke up at 11 PM and ate something without bolusing, and blood sugar was 308 at 12:30AM this morning.  She took 1 click (2u) and FBS today was 107.   2 hr. pcB blood sugar was 399.  She took 1 click and  2hr. Later: 288.,  AcL: [id number] (took 2 clicks),  2hr. PcL: 241.

## 2014-05-15 NOTE — Telephone Encounter (Signed)
Contacted pt and advised of note below. Pt stated that she wanted to be taken off the schedule because she said she had something come up. Pt was offered an appointment at a earlier time or a later time. Pt was advised that Dr. Loanne Drilling would be out of town for 2 weeks. Pt declined to make appointment. Pt removed from schedule.

## 2014-05-16 ENCOUNTER — Telehealth: Payer: Self-pay | Admitting: Endocrinology

## 2014-05-16 ENCOUNTER — Ambulatory Visit: Payer: Medicaid Other | Admitting: Endocrinology

## 2014-05-16 NOTE — Telephone Encounter (Signed)
Today's blood sugar is 730 am 88, 1030 am 270, 204 PM 336  between  730 and 1030 she ate an egg, sausage and cake.  [id number] she has had 2 bowls of cinnamon toast crunch  She has taken 6 clicks so far today  Please advise

## 2014-05-16 NOTE — Telephone Encounter (Signed)
See below and please advise during Dr. Cordelia Pen absence Pt has just recently started her VGO pump. Thanks!

## 2014-05-16 NOTE — Telephone Encounter (Signed)
Patient states that you do not need to tell her what to do, Dr. Loanne Drilling is her Dr. And she just wanted to know whether he is going to up her from the V-go 20 to V-go 30. Patient was very irate that another Dr. Lynnda Child tell her what to do.

## 2014-05-16 NOTE — Progress Notes (Signed)
Optho appt with [facility] 06/11/14 @ 1030a.  U/S with Radiology r/s 05/20/14 @ 1130a.

## 2014-05-16 NOTE — Telephone Encounter (Signed)
Pt advised of note below and she stated she will contact our office with her blood sugar readings.

## 2014-05-16 NOTE — Telephone Encounter (Signed)
She needs to stop eating cereals completely She will need to take at least additional 2 more clicks for her meals compared to what she is doing now.  Need to confirm what dose she is taking before eating

## 2014-05-17 NOTE — Telephone Encounter (Signed)
Called pt and advised that we would have to wait until Monday to discuss With [name] about changing to the V-GO 30. PT voiced understanding. Explained to pt that Dr. Loanne Drilling is out of the office and since she did not want to comply with note below we would have to wait until Monday. Pt stated she had enough of the V-GO 20 sample to last her through the weekend.

## 2014-05-17 NOTE — Telephone Encounter (Signed)
[name] called pt to see what she needed. Patient is just asking if Dr. Loanne Drilling is going to change her vgo from 20 to 43. Please advise her. [name] please be the one to call her back. I have forwarded this to [facility] as well she says [name] may be aware of this.

## 2014-05-17 NOTE — Telephone Encounter (Signed)
Pt stated medicaid had called and advised her the V-Go would be covered through her insurance.

## 2014-05-17 NOTE — Telephone Encounter (Signed)
please call patient: I am out of town, and available on an intermittent basis only.  Please let the other drs help you. The V-GO 20 ca give up to a total of 56 units per day (20 units of basal + 18 clicks).  If you need more, we will increase the the 30 or 40.  For now, please increase the clicks to 4 per meal.  Call us Monday to report progress.  Please call sooner if necessary over the weekend. i hope you feel well.

## 2014-05-17 NOTE — Telephone Encounter (Signed)
Pt advised of note below and she states she will call on Monday to report progress.

## 2014-05-17 NOTE — Telephone Encounter (Signed)
Called pt and advised that we would have to wait until Monday to discuss With Ann Robertson about changing to the V-GO 30. PT voiced understanding. Explained to pt that Dr. Loanne Drilling is out of the office and since she did not want to comply with note below we would have to wait until Monday. Pt stated she had enough of the V-GO 20 sample to last her through the weekend.

## 2014-05-20 ENCOUNTER — Ambulatory Visit (HOSPITAL_COMMUNITY)
Admission: RE | Admit: 2014-05-20 | Discharge: 2014-05-20 | Disposition: A | Discharge status: Home / Self Care | Payer: Medicaid Other | Source: Ambulatory Visit | Attending: Family Medicine | Admitting: Family Medicine

## 2014-05-20 ENCOUNTER — Other Ambulatory Visit: Payer: Self-pay | Admitting: Obstetrics and Gynecology

## 2014-05-20 ENCOUNTER — Ambulatory Visit (INDEPENDENT_AMBULATORY_CARE_PROVIDER_SITE_OTHER): Payer: Medicaid Other | Admitting: Obstetrics and Gynecology

## 2014-05-20 ENCOUNTER — Telehealth: Payer: Self-pay

## 2014-05-20 VITALS — BP 132/79 | HR 104 | Wt 153.2 lb

## 2014-05-20 DIAGNOSIS — O24012 Pre-existing diabetes mellitus, type 1, in pregnancy, second trimester: Secondary | ICD-10-CM

## 2014-05-20 DIAGNOSIS — Z3A25 25 weeks gestation of pregnancy: Secondary | ICD-10-CM | POA: Insufficient documentation

## 2014-05-20 DIAGNOSIS — E1021 Type 1 diabetes mellitus with diabetic nephropathy: Secondary | ICD-10-CM

## 2014-05-20 DIAGNOSIS — O3421 Maternal care for scar from previous cesarean delivery: Secondary | ICD-10-CM | POA: Diagnosis present

## 2014-05-20 DIAGNOSIS — O09892 Supervision of other high risk pregnancies, second trimester: Secondary | ICD-10-CM

## 2014-05-20 DIAGNOSIS — E109 Type 1 diabetes mellitus without complications: Secondary | ICD-10-CM | POA: Diagnosis not present

## 2014-05-20 DIAGNOSIS — O09292 Supervision of pregnancy with other poor reproductive or obstetric history, second trimester: Secondary | ICD-10-CM | POA: Insufficient documentation

## 2014-05-20 DIAGNOSIS — O09899 Supervision of other high risk pregnancies, unspecified trimester: Secondary | ICD-10-CM

## 2014-05-20 DIAGNOSIS — O09212 Supervision of pregnancy with history of pre-term labor, second trimester: Secondary | ICD-10-CM

## 2014-05-20 DIAGNOSIS — O0992 Supervision of high risk pregnancy, unspecified, second trimester: Secondary | ICD-10-CM

## 2014-05-20 DIAGNOSIS — O34219 Maternal care for unspecified type scar from previous cesarean delivery: Secondary | ICD-10-CM

## 2014-05-20 DIAGNOSIS — N898 Other specified noninflammatory disorders of vagina: Secondary | ICD-10-CM

## 2014-05-20 MED ORDER — VALACYCLOVIR HCL 500 MG PO TABS: 500.0000 mg | ORAL_TABLET | Freq: Two times a day (BID) | ORAL | Status: DC

## 2014-05-20 MED ORDER — HYDROXYPROGESTERONE CAPROATE 250 MG/ML IM OIL
250.0000 mg | TOPICAL_OIL | INTRAMUSCULAR | Status: DC
  Administered 2014-05-20 – 2014-07-23 (×8): 250 mg via INTRAMUSCULAR

## 2014-05-20 NOTE — Telephone Encounter (Signed)
Received a phone call from the [facility]. They have received the pt's information and will be shipping V-GO 20 to the pt today or tomorrow.

## 2014-05-20 NOTE — Progress Notes (Signed)
23 y.o. [id number] at [redacted]w[redacted]d. Painful vaginal rash today.  1. Type I Diabetes with nephropathy. Continues to be followed by Endocrinology and is not interested in [place] managing her diabetes in pregnancy. Blood sugars have been above goal, between 100-300. Medications changed last week and blood sugars improved. Reviewed risks including still birth.  2. History of preterm birth. 17P today.  3. Herpes outbreak. Rx valtrex 500 mg BID x 3 days.  4. Routine PNC. FM/PTL precautions reviewed.

## 2014-05-20 NOTE — Progress Notes (Signed)
Pt states that she has some bumps in her vaginal area that hurt.

## 2014-05-20 NOTE — Addendum Note (Signed)
Addended by: Rutherford Nail E on: 05/20/2014 11:04 AM   Modules accepted: Orders

## 2014-05-20 NOTE — Telephone Encounter (Signed)
Pt came to office at 345pm stating she needed another V-Go sample. Pt stated she had used her last sample earlier this morning. Pt has not heard anything from the medical supplying company about the V-Go. Pt was given another V-Go 20 sample. I contacted [place] about V-Go. Was advised that I would be receiving a call back from Darnelle Bos on the order. Waiting on call back.

## 2014-05-20 NOTE — Addendum Note (Signed)
Addended by: Shelbie Hutching A on: 05/20/2014 11:12 AM   Modules accepted: Orders

## 2014-05-21 ENCOUNTER — Ambulatory Visit (HOSPITAL_COMMUNITY): Payer: Medicaid Other

## 2014-05-21 DIAGNOSIS — Z3A25 25 weeks gestation of pregnancy: Secondary | ICD-10-CM | POA: Insufficient documentation

## 2014-05-27 ENCOUNTER — Ambulatory Visit (INDEPENDENT_AMBULATORY_CARE_PROVIDER_SITE_OTHER): Payer: Medicaid Other | Admitting: Family Medicine

## 2014-05-27 VITALS — BP 122/81 | HR 101 | Temp 98.5°F | Wt 154.5 lb

## 2014-05-27 DIAGNOSIS — O09892 Supervision of other high risk pregnancies, second trimester: Secondary | ICD-10-CM

## 2014-05-27 DIAGNOSIS — O09212 Supervision of pregnancy with history of pre-term labor, second trimester: Secondary | ICD-10-CM

## 2014-05-27 DIAGNOSIS — O24012 Pre-existing diabetes mellitus, type 1, in pregnancy, second trimester: Secondary | ICD-10-CM

## 2014-05-27 LAB — GC/CHLAMYDIA PROBE AMP
CT PROBE, AMP APTIMA: NEGATIVE
GC PROBE AMP APTIMA: NEGATIVE

## 2014-05-27 NOTE — Progress Notes (Signed)
Patient is 23 y.o. [id number] [redacted]w[redacted]d.  +FM, denies LOF, VB, contractions, vaginal discharge - FSB: 79-338, does not have log, nor does she check glucose regularly, few FBS on monitor  => declines admission for mgmt of insulin, declines any changes to medication, requests 3D ultrasound

## 2014-06-03 ENCOUNTER — Ambulatory Visit (HOSPITAL_COMMUNITY): Payer: Medicaid Other

## 2014-06-03 ENCOUNTER — Ambulatory Visit (INDEPENDENT_AMBULATORY_CARE_PROVIDER_SITE_OTHER): Payer: Medicaid Other | Admitting: Obstetrics and Gynecology

## 2014-06-03 VITALS — BP 120/75 | HR 96 | Temp 97.8°F | Wt 155.0 lb

## 2014-06-03 DIAGNOSIS — O24013 Pre-existing diabetes mellitus, type 1, in pregnancy, third trimester: Secondary | ICD-10-CM

## 2014-06-03 LAB — POCT URINALYSIS DIP (DEVICE)
Bilirubin Urine: NEGATIVE
Glucose, UA: 500 mg/dL — AB
KETONES UR: NEGATIVE mg/dL
Leukocytes, UA: NEGATIVE
Nitrite: NEGATIVE
PH: 6 (ref 5.0–8.0)
Protein, ur: 100 mg/dL — AB
Specific Gravity, Urine: 1.025 (ref 1.005–1.030)
Urobilinogen, UA: 0.2 mg/dL (ref 0.0–1.0)

## 2014-06-03 NOTE — Progress Notes (Signed)
Follow up to Endocrinologist- Dr. Loanne Drilling as ordered were unsuccessful. Patient could not agree with any of the time available. She opted to call herself to see if there are any cancellations for the times she wanted. Advised patient of importance and she agrees.

## 2014-06-03 NOTE — Progress Notes (Signed)
  Patient is 23 y.o. [id number] [redacted]w[redacted]d. +FM, denies LOF, VB, contractions, vaginal discharge  - FSB: Does not have blood sugars with her today, does not have meter either. Does not have log, nor does she check glucose regularly. States that over the past week her sugars have been "great." States last night sugar was 421, but she says it is because where she put in her pump. She states majority has been 130 - 160, one or 2 > 230.  => declines admission for mgmt of insulin, declines any changes to medication because she has endocrinologist (last saw before holidays, she will call to schedule appt), requests 3D ultrasound  - Received fetal echo this AM  - give 17 p today.

## 2014-06-11 ENCOUNTER — Encounter: Payer: Medicaid Other | Admitting: Advanced Practice Midwife

## 2014-06-11 ENCOUNTER — Telehealth: Payer: Self-pay | Admitting: Advanced Practice Midwife

## 2014-06-11 ENCOUNTER — Encounter: Payer: Self-pay | Admitting: Advanced Practice Midwife

## 2014-06-11 NOTE — Telephone Encounter (Signed)
Called patient left message for her to return call to clinic. Mailing certified letter per missed OB follow appointment.

## 2014-06-12 ENCOUNTER — Ambulatory Visit: Payer: Medicaid Other | Admitting: Endocrinology

## 2014-06-17 ENCOUNTER — Ambulatory Visit (INDEPENDENT_AMBULATORY_CARE_PROVIDER_SITE_OTHER): Payer: Medicaid Other | Admitting: Obstetrics and Gynecology

## 2014-06-17 ENCOUNTER — Ambulatory Visit (HOSPITAL_COMMUNITY)
Admission: RE | Admit: 2014-06-17 | Discharge: 2014-06-17 | Disposition: A | Discharge status: Home / Self Care | Payer: Medicaid Other | Source: Ambulatory Visit | Attending: Obstetrics and Gynecology | Admitting: Obstetrics and Gynecology

## 2014-06-17 VITALS — BP 128/79 | HR 98 | Temp 98.0°F | Wt 158.2 lb

## 2014-06-17 DIAGNOSIS — Z3A29 29 weeks gestation of pregnancy: Secondary | ICD-10-CM | POA: Insufficient documentation

## 2014-06-17 DIAGNOSIS — E109 Type 1 diabetes mellitus without complications: Secondary | ICD-10-CM | POA: Diagnosis not present

## 2014-06-17 DIAGNOSIS — O09213 Supervision of pregnancy with history of pre-term labor, third trimester: Secondary | ICD-10-CM | POA: Insufficient documentation

## 2014-06-17 DIAGNOSIS — Z23 Encounter for immunization: Secondary | ICD-10-CM

## 2014-06-17 DIAGNOSIS — O24013 Pre-existing diabetes mellitus, type 1, in pregnancy, third trimester: Secondary | ICD-10-CM

## 2014-06-17 DIAGNOSIS — O0993 Supervision of high risk pregnancy, unspecified, third trimester: Secondary | ICD-10-CM

## 2014-06-17 DIAGNOSIS — O3421 Maternal care for scar from previous cesarean delivery: Secondary | ICD-10-CM | POA: Insufficient documentation

## 2014-06-17 DIAGNOSIS — O0992 Supervision of high risk pregnancy, unspecified, second trimester: Secondary | ICD-10-CM

## 2014-06-17 LAB — CBC
HCT: 35 % — ABNORMAL LOW (ref 36.0–46.0)
Hemoglobin: 11.9 g/dL — ABNORMAL LOW (ref 12.0–15.0)
MCH: 30.7 pg (ref 26.0–34.0)
MCHC: 34 g/dL (ref 30.0–36.0)
MCV: 90.2 fL (ref 78.0–100.0)
MPV: 10.2 fL (ref 8.6–12.4)
PLATELETS: 250 10*3/uL (ref 150–400)
RBC: 3.88 MIL/uL (ref 3.87–5.11)
RDW: 13.3 % (ref 11.5–15.5)
WBC: 7.4 10*3/uL (ref 4.0–10.5)

## 2014-06-17 MED ORDER — TETANUS-DIPHTH-ACELL PERTUSSIS 5-2.5-18.5 LF-MCG/0.5 IM SUSP
0.5000 mL | Freq: Once | INTRAMUSCULAR | Status: AC
  Administered 2014-06-17: 0.5 mL via INTRAMUSCULAR

## 2014-06-17 NOTE — Progress Notes (Signed)
C/o of lower back pain.  C/o of white vaginal discharge causing itching.  CBC, HIV, RPR, Tdap and 17P today.

## 2014-06-17 NOTE — Progress Notes (Signed)
Patient is 24 y.o. [id number] [redacted]w[redacted]d +FM, denies LOF, VB, contractions, vaginal discharge Does endorse some back pains   - FSB: Still has not met with Dr. ELoanne Drilling Last visit could not get in with scheduler. Does not have blood sugars with her today. Does not have log, nor does she check glucose regularly.  Does bring her meter, but states they have been "awful." Has been out of town, but did not have pump supplies over the past week. Now has a "vigo" pump, but has not been wearing for past week.  - Fasting: 122, 221 - 2 hr pp: 316, 203, 447, 451, 219, 239, 376, 119, 120, 209, 423  For past week, sugars grossly uncontrolled, in 400s.   => Offered admission and declined admission for mgmt of insulin, declines any changes to medication because she has endocrinologist (last saw before holidays, she will call to schedule appt)   - Received fetal echo, stated all WNL, but do not have results - got eye exam, states no findings of DM in eye - Got growth UKoreathis AM, pending.   - give 17 p and Tdap today. - Will get CBC, HIV, RPR   RTC 3-4 days for NST + HROB appt given Uncontrolled type I DM Sent back to aid with scheduling appt with endocrine as pt states she has no phone to help schedule.

## 2014-06-18 ENCOUNTER — Encounter: Payer: Self-pay | Admitting: General Practice

## 2014-06-18 LAB — WET PREP, GENITAL
CLUE CELLS WET PREP: NONE SEEN
TRICH WET PREP: NONE SEEN
Yeast Wet Prep HPF POC: NONE SEEN

## 2014-06-18 LAB — HIV ANTIBODY (ROUTINE TESTING W REFLEX): HIV 1&2 Ab, 4th Generation: NONREACTIVE

## 2014-06-18 LAB — RPR

## 2014-06-20 ENCOUNTER — Ambulatory Visit (INDEPENDENT_AMBULATORY_CARE_PROVIDER_SITE_OTHER): Payer: Medicaid Other | Admitting: Family Medicine

## 2014-06-20 VITALS — BP 132/83 | HR 86

## 2014-06-20 DIAGNOSIS — O24013 Pre-existing diabetes mellitus, type 1, in pregnancy, third trimester: Secondary | ICD-10-CM

## 2014-06-20 NOTE — Progress Notes (Signed)
Per fetal testing guideline pt will have BPP on 1/25 and 2/1, then begin 2x/wk NST and weekly AFI on 2/8.

## 2014-06-20 NOTE — Progress Notes (Signed)
Did not bring meter or log book.  NST - had 1 brief variable decel, but 25 minutes of reactive strip afterwards with at least 5 accels. F/u monday

## 2014-06-24 ENCOUNTER — Ambulatory Visit (HOSPITAL_COMMUNITY): Admission: RE | Admit: 2014-06-24 | Payer: Medicaid Other | Source: Ambulatory Visit

## 2014-06-24 ENCOUNTER — Encounter: Payer: Medicaid Other | Admitting: Obstetrics and Gynecology

## 2014-06-24 ENCOUNTER — Other Ambulatory Visit: Payer: Medicaid Other | Admitting: Family Medicine

## 2014-06-25 ENCOUNTER — Ambulatory Visit: Payer: Medicaid Other | Admitting: Endocrinology

## 2014-06-26 ENCOUNTER — Telehealth: Payer: Self-pay | Admitting: Endocrinology

## 2014-06-26 NOTE — Telephone Encounter (Signed)
[facility] requesting pt call back and schedule appointment. No show letter sent.

## 2014-06-26 NOTE — Telephone Encounter (Signed)
Follow up advised. Contact patient and schedule visit in 2 weeks. 

## 2014-06-26 NOTE — Telephone Encounter (Signed)
Patient no showed today's appt. Please advise on how to follow up. °A. No follow up necessary. °B. Follow up urgent. Contact patient immediately. °C. Follow up necessary. Contact patient and schedule visit in ___ days. °D. Follow up advised. Contact patient and schedule visit in ____weeks. ° °

## 2014-06-27 ENCOUNTER — Other Ambulatory Visit: Payer: Medicaid Other

## 2014-07-01 ENCOUNTER — Ambulatory Visit (HOSPITAL_COMMUNITY)
Admission: RE | Admit: 2014-07-01 | Discharge: 2014-07-01 | Disposition: A | Discharge status: Home / Self Care | Payer: Medicaid Other | Source: Ambulatory Visit | Attending: Family Medicine | Admitting: Family Medicine

## 2014-07-01 ENCOUNTER — Ambulatory Visit (INDEPENDENT_AMBULATORY_CARE_PROVIDER_SITE_OTHER): Payer: Medicaid Other | Admitting: Family Medicine

## 2014-07-01 VITALS — BP 117/73 | HR 92 | Temp 98.4°F | Wt 160.9 lb

## 2014-07-01 DIAGNOSIS — Z36 Encounter for antenatal screening of mother: Secondary | ICD-10-CM | POA: Insufficient documentation

## 2014-07-01 DIAGNOSIS — O0933 Supervision of pregnancy with insufficient antenatal care, third trimester: Secondary | ICD-10-CM | POA: Insufficient documentation

## 2014-07-01 DIAGNOSIS — O09213 Supervision of pregnancy with history of pre-term labor, third trimester: Secondary | ICD-10-CM | POA: Diagnosis not present

## 2014-07-01 DIAGNOSIS — O24013 Pre-existing diabetes mellitus, type 1, in pregnancy, third trimester: Secondary | ICD-10-CM

## 2014-07-01 DIAGNOSIS — O34219 Maternal care for unspecified type scar from previous cesarean delivery: Secondary | ICD-10-CM

## 2014-07-01 DIAGNOSIS — O3421 Maternal care for scar from previous cesarean delivery: Secondary | ICD-10-CM

## 2014-07-01 DIAGNOSIS — Z3A31 31 weeks gestation of pregnancy: Secondary | ICD-10-CM | POA: Insufficient documentation

## 2014-07-01 DIAGNOSIS — O409XX0 Polyhydramnios, unspecified trimester, not applicable or unspecified: Secondary | ICD-10-CM | POA: Insufficient documentation

## 2014-07-01 DIAGNOSIS — O0993 Supervision of high risk pregnancy, unspecified, third trimester: Secondary | ICD-10-CM

## 2014-07-01 DIAGNOSIS — O24012 Pre-existing diabetes mellitus, type 1, in pregnancy, second trimester: Secondary | ICD-10-CM

## 2014-07-01 LAB — POCT URINALYSIS DIP (DEVICE)
Bilirubin Urine: NEGATIVE
Glucose, UA: NEGATIVE mg/dL
Ketones, ur: NEGATIVE mg/dL
Nitrite: NEGATIVE
PH: 6.5 (ref 5.0–8.0)
Protein, ur: 100 mg/dL — AB
Specific Gravity, Urine: 1.025 (ref 1.005–1.030)
UROBILINOGEN UA: 1 mg/dL (ref 0.0–1.0)

## 2014-07-01 MED ORDER — PRENATAL VITAMINS PLUS 27-1 MG PO TABS: 1.0000 | ORAL_TABLET | Freq: Every day | ORAL | Status: DC

## 2014-07-01 NOTE — Progress Notes (Addendum)
Blood sugars remain out of control - range from 86 - 488.  Has appt with endocrine tomorrow. Still using insulin pump.  Patient insistent upon management of blood sugars by endocrine BPP today. Labor and fetal movement precautions given.

## 2014-07-02 ENCOUNTER — Ambulatory Visit (INDEPENDENT_AMBULATORY_CARE_PROVIDER_SITE_OTHER): Payer: Medicaid Other | Admitting: Endocrinology

## 2014-07-02 ENCOUNTER — Encounter: Payer: Medicaid Other | Attending: Obstetrics & Gynecology | Admitting: Nutrition

## 2014-07-02 VITALS — BP 126/82 | HR 97 | Temp 98.2°F | Ht 65.0 in | Wt 160.0 lb

## 2014-07-02 DIAGNOSIS — O24012 Pre-existing diabetes mellitus, type 1, in pregnancy, second trimester: Secondary | ICD-10-CM | POA: Diagnosis present

## 2014-07-02 DIAGNOSIS — Z713 Dietary counseling and surveillance: Secondary | ICD-10-CM | POA: Diagnosis not present

## 2014-07-02 DIAGNOSIS — E1021 Type 1 diabetes mellitus with diabetic nephropathy: Secondary | ICD-10-CM

## 2014-07-02 DIAGNOSIS — Z794 Long term (current) use of insulin: Secondary | ICD-10-CM | POA: Diagnosis not present

## 2014-07-02 DIAGNOSIS — O24013 Pre-existing diabetes mellitus, type 1, in pregnancy, third trimester: Secondary | ICD-10-CM

## 2014-07-02 NOTE — Patient Instructions (Signed)
check your blood sugar 6 times a day.  vary the time of day when you check, between before the 3 meals, after meals on a rotating basis, and at bedtime.  also check if you have symptoms of your blood sugar being too high or too low.  please keep a record of the readings and bring it to your next appointment here.  You can write it on any piece of paper.  please call us sooner if your blood sugar goes below 70, or if you have a lot of readings over 200. Please start taking the V-GO-20.  This gives you 20 units per day basal.  Also please give yourself 2 clicks (6 units) 3 times a day (just before each meal). Also, take 1 extra click for any blood sugar over 150, and 2 if over 250. Please come back for a follow-up appointment in 1-2 weeks.

## 2014-07-02 NOTE — Progress Notes (Signed)
Subjective:    Patient ID: Ann Robertson, female    DOB: [birth year], 24 y.o.   MRN: [id number]  HPI Pt returns for f/u of diabetes mellitus: DM type: 1 Dx'ed: 4098 Complications: polyneuropathy and nephropathy. Therapy: insulin since dx DKA: last episode was in 2008 Severe hypoglycemia: last episode was approx 2012.   Pancreatitis: never.   Other: she took pump rx only during the 2014-2015 pregnancy Interval history: she is again overdue for f/u.  Pt is now [redacted] weeks pregnant.  cbg meter is downloaded, and scanned into the record.   Past Medical History  Diagnosis Date  . Diabetes mellitus   . Juvenile diabetes mellitus   . Juvenile diabetes 2000  . Neuropathy   . High cholesterol   . HSV-2 infection   . STD (sexually transmitted disease)     Past Surgical History  Procedure Laterality Date  . Cesarean section    . Wisdom tooth extraction    . Cesarean section N/A 07/05/2013    Procedure: CESAREAN SECTION;  Surgeon: Emily Filbert, MD;  Location: [facility];  Service: Obstetrics;  Laterality: N/A;    History   Social History  . Marital Status: Single    Spouse Name: N/A    Number of Children: N/A  . Years of Education: N/A   Occupational History  . Not on file.   Social History Main Topics  . Smoking status: Former Research scientist (life sciences)  . Smokeless tobacco: Never Used  . Alcohol Use: No  . Drug Use: No  . Sexual Activity: Yes    Birth Control/ Protection: None   Other Topics Concern  . Not on file   Social History Narrative    Current Outpatient Prescriptions on File Prior to Visit  Medication Sig Dispense Refill  . aspirin 81 MG chewable tablet Chew 1 tablet (81 mg total) by mouth daily. 60 tablet 3  . insulin aspart (NOVOLOG) 100 UNIT/ML injection For use in pump, total of 50 units per day 2 vial PRN  . Insulin Disposable Pump (V-GO 20) KIT 1 Device per day (30 units per month) DX CODE: O24.012 30 kit 12  . Insulin Syringe-Needle U-100 (ADVOCATE INSULIN SYRINGE) 31G X  5/16" 1 ML MISC Use to administer insulin 100 each 0  . Prenatal Vit-Fe Fumarate-FA (PRENATAL VITAMINS PLUS) 27-1 MG TABS Take 1 tablet by mouth daily. 30 tablet 5  . valACYclovir (VALTREX) 500 MG tablet Take 1 tablet (500 mg total) by mouth 2 (two) times daily. 6 tablet 0   Current Facility-Administered Medications on File Prior to Visit  Medication Dose Route Frequency Provider Last Rate Last Dose  . hydroxyprogesterone caproate (DELALUTIN) 250 mg/mL injection 250 mg  250 mg Intramuscular Weekly Shelbie Hutching, MD   250 mg at 07/01/14 1109    Allergies  Allergen Reactions  . Hydromorphone Hcl Rash    Family History  Problem Relation Age of Onset  . Stroke Mother   . Hypertension Mother   . Heart disease Mother   . Kidney disease Mother   . Hyperlipidemia Father   . Diabetes Father     BP 126/82 mmHg  Pulse 97  Temp(Src) 98.2 F (36.8 C) (Oral)  Ht 5' 5"  (1.651 m)  Wt 160 lb (72.576 kg)  BMI 26.63 kg/m2  SpO2 98%  LMP  (LMP Unknown)    Review of Systems Denies LOC    Objective:   Physical Exam VITAL SIGNS:  See vs page GENERAL: no distress PSYCH: Alert and well-oriented.  Does not appear anxious nor depressed.       Assessment & Plan:  DM: poor control, apparently to difficulty using V-GO device.   Patient is advised the following: Patient Instructions  check your blood sugar 6 times a day.  vary the time of day when you check, between before the 3 meals, after meals on a rotating basis, and at bedtime.  also check if you have symptoms of your blood sugar being too high or too low.  please keep a record of the readings and bring it to your next appointment here.  You can write it on any piece of paper.  please call us sooner if your blood sugar goes below 70, or if you have a lot of readings over 200. Please start taking the V-GO-20.  This gives you 20 units per day basal.  Also please give yourself 2 clicks (6 units) 3 times a day (just before each  meal). Also, take 1 extra click for any blood sugar over 150, and 2 if over 250. Please come back for a follow-up appointment in 1-2 weeks.

## 2014-07-03 NOTE — Progress Notes (Signed)
Patient reports that she is "really not bolusing for the meals eaten, but just to get the blood sugars down.  Reviewed the importance of this, and the need to take the premeal boluses to prevent the blood sugars from going high, and not to correct them after they are already high.  She agreed to this, and said she will take 3 clicks at breakfast (she eats 45-70 carbs), and 1 extra if blood sugar is over 150.  She will take 1 click at lunch time, even if not eating, and 3 clicks if eating lunch plus the correction.  Supper is also 3 clicks with a correction if needed.  She promised to call me on Monday with blood sugar readings.    She did not think the pump was giving her insulin, because she had some remaining in the v-go at the endo of the day.  She was told that it holds 28 extra units that she can use to bolus her meals each day.  If she does not use them, she must throw it away.  She reported good understanding of this.

## 2014-07-03 NOTE — Patient Instructions (Signed)
Attach 1 V-go every day at the same time Give 3 clicks if eating a meal, and 1 extra click if blood sugar is over [id number], and 2 extra clicks if blood sugar is over 250. If not eating lunch, give 1 click

## 2014-07-08 ENCOUNTER — Ambulatory Visit (INDEPENDENT_AMBULATORY_CARE_PROVIDER_SITE_OTHER): Payer: Medicaid Other | Admitting: Obstetrics and Gynecology

## 2014-07-08 ENCOUNTER — Ambulatory Visit (HOSPITAL_COMMUNITY)
Admission: RE | Admit: 2014-07-08 | Discharge: 2014-07-08 | Disposition: A | Discharge status: Home / Self Care | Payer: Medicaid Other | Source: Ambulatory Visit | Attending: Obstetrics and Gynecology | Admitting: Obstetrics and Gynecology

## 2014-07-08 VITALS — BP 126/77 | HR 93 | Temp 97.8°F | Wt 164.5 lb

## 2014-07-08 DIAGNOSIS — O24012 Pre-existing diabetes mellitus, type 1, in pregnancy, second trimester: Secondary | ICD-10-CM | POA: Insufficient documentation

## 2014-07-08 DIAGNOSIS — O403XX0 Polyhydramnios, third trimester, not applicable or unspecified: Secondary | ICD-10-CM

## 2014-07-08 DIAGNOSIS — O09893 Supervision of other high risk pregnancies, third trimester: Secondary | ICD-10-CM

## 2014-07-08 DIAGNOSIS — O0993 Supervision of high risk pregnancy, unspecified, third trimester: Secondary | ICD-10-CM

## 2014-07-08 DIAGNOSIS — O0933 Supervision of pregnancy with insufficient antenatal care, third trimester: Secondary | ICD-10-CM | POA: Insufficient documentation

## 2014-07-08 DIAGNOSIS — Z3A34 34 weeks gestation of pregnancy: Secondary | ICD-10-CM | POA: Insufficient documentation

## 2014-07-08 DIAGNOSIS — O34219 Maternal care for unspecified type scar from previous cesarean delivery: Secondary | ICD-10-CM

## 2014-07-08 DIAGNOSIS — O3421 Maternal care for scar from previous cesarean delivery: Secondary | ICD-10-CM | POA: Insufficient documentation

## 2014-07-08 DIAGNOSIS — O24013 Pre-existing diabetes mellitus, type 1, in pregnancy, third trimester: Secondary | ICD-10-CM

## 2014-07-08 DIAGNOSIS — O09213 Supervision of pregnancy with history of pre-term labor, third trimester: Secondary | ICD-10-CM

## 2014-07-08 DIAGNOSIS — O09899 Supervision of other high risk pregnancies, unspecified trimester: Secondary | ICD-10-CM

## 2014-07-08 NOTE — Progress Notes (Signed)
Growth U/S 08/05/14 @ 930a with Sims.

## 2014-07-08 NOTE — Addendum Note (Signed)
Addended by: Shelbie Hutching A on: 07/08/2014 11:10 AM   Modules accepted: Orders

## 2014-07-08 NOTE — Progress Notes (Signed)
Doing well today.  1. Type I DM. Blood sugars are out of range, although improved per patient report. She continues to follow with endocrine and prefers that they manage her diabetes. Using insulin pump. BPP 8/8 today. EFW 2587 gm (87%ile). Start twice weekly NST starting on Thursday with weekly AFI.  2. History of Preterm delivery. 17 P today.  3. Mild polyhydramnios. Continue twice weekly NST with weekly AFI as above.  4. Routine prenatal care. FM/VB/PTL precautions given.

## 2014-07-08 NOTE — Addendum Note (Signed)
Addended by: Langston Reusing on: 07/08/2014 10:36 AM   Modules accepted: Level of Service

## 2014-07-08 NOTE — Progress Notes (Signed)
17P injection/ OBF

## 2014-07-12 ENCOUNTER — Ambulatory Visit (INDEPENDENT_AMBULATORY_CARE_PROVIDER_SITE_OTHER): Payer: Medicaid Other | Admitting: *Deleted

## 2014-07-12 VITALS — BP 134/86 | HR 112

## 2014-07-12 DIAGNOSIS — O24013 Pre-existing diabetes mellitus, type 1, in pregnancy, third trimester: Secondary | ICD-10-CM

## 2014-07-12 NOTE — Progress Notes (Addendum)
Reactive NST 

## 2014-07-15 ENCOUNTER — Other Ambulatory Visit: Payer: Medicaid Other

## 2014-07-16 ENCOUNTER — Ambulatory Visit (INDEPENDENT_AMBULATORY_CARE_PROVIDER_SITE_OTHER): Payer: Medicaid Other

## 2014-07-16 VITALS — BP 123/97 | HR 105 | Temp 98.3°F | Wt 165.0 lb

## 2014-07-16 DIAGNOSIS — O09893 Supervision of other high risk pregnancies, third trimester: Secondary | ICD-10-CM

## 2014-07-16 DIAGNOSIS — O09213 Supervision of pregnancy with history of pre-term labor, third trimester: Secondary | ICD-10-CM

## 2014-07-16 NOTE — Progress Notes (Signed)
Patient here today for weekly makena injection. 68ml 17P administered into RUO quadrant of buttocks. Patient tolerated well. C/o of gum discomfort and intermittent pain. Does not see dentist. Gave patient [facility] information [phone]) and advised she call if she have a problem getting an appointment. Patient verbalized understanding and gratitude. No questions or concerns.

## 2014-07-18 ENCOUNTER — Ambulatory Visit (INDEPENDENT_AMBULATORY_CARE_PROVIDER_SITE_OTHER): Payer: Medicaid Other | Admitting: *Deleted

## 2014-07-18 VITALS — BP 129/81 | HR 100

## 2014-07-18 DIAGNOSIS — O24013 Pre-existing diabetes mellitus, type 1, in pregnancy, third trimester: Secondary | ICD-10-CM

## 2014-07-18 DIAGNOSIS — O403XX1 Polyhydramnios, third trimester, fetus 1: Secondary | ICD-10-CM

## 2014-07-18 DIAGNOSIS — O403XX0 Polyhydramnios, third trimester, not applicable or unspecified: Secondary | ICD-10-CM

## 2014-07-18 NOTE — Progress Notes (Signed)
NST reviewed and reactive.  Ambriel Gorelick L. Harraway-Smith, M.D., FACOG    

## 2014-07-19 ENCOUNTER — Other Ambulatory Visit: Payer: Medicaid Other

## 2014-07-23 ENCOUNTER — Encounter: Payer: Self-pay | Admitting: Advanced Practice Midwife

## 2014-07-23 ENCOUNTER — Ambulatory Visit (INDEPENDENT_AMBULATORY_CARE_PROVIDER_SITE_OTHER): Payer: Medicaid Other | Admitting: Advanced Practice Midwife

## 2014-07-23 ENCOUNTER — Inpatient Hospital Stay (HOSPITAL_COMMUNITY)
Admission: AD | Admit: 2014-07-23 | Discharge: 2014-07-23 | Disposition: A | Discharge status: Home / Self Care | Payer: Medicaid Other | Source: Ambulatory Visit | Attending: Family Medicine | Admitting: Family Medicine

## 2014-07-23 ENCOUNTER — Encounter (HOSPITAL_COMMUNITY): Payer: Self-pay | Admitting: *Deleted

## 2014-07-23 VITALS — BP 147/91 | HR 92 | Wt 169.7 lb

## 2014-07-23 DIAGNOSIS — Z87891 Personal history of nicotine dependence: Secondary | ICD-10-CM | POA: Diagnosis not present

## 2014-07-23 DIAGNOSIS — O09893 Supervision of other high risk pregnancies, third trimester: Secondary | ICD-10-CM

## 2014-07-23 DIAGNOSIS — O1213 Gestational proteinuria, third trimester: Secondary | ICD-10-CM

## 2014-07-23 DIAGNOSIS — O133 Gestational [pregnancy-induced] hypertension without significant proteinuria, third trimester: Secondary | ICD-10-CM | POA: Diagnosis not present

## 2014-07-23 DIAGNOSIS — Z794 Long term (current) use of insulin: Secondary | ICD-10-CM | POA: Diagnosis not present

## 2014-07-23 DIAGNOSIS — O24113 Pre-existing diabetes mellitus, type 2, in pregnancy, third trimester: Secondary | ICD-10-CM | POA: Diagnosis not present

## 2014-07-23 DIAGNOSIS — O403XX1 Polyhydramnios, third trimester, fetus 1: Secondary | ICD-10-CM

## 2014-07-23 DIAGNOSIS — E119 Type 2 diabetes mellitus without complications: Secondary | ICD-10-CM | POA: Insufficient documentation

## 2014-07-23 DIAGNOSIS — O24013 Pre-existing diabetes mellitus, type 1, in pregnancy, third trimester: Secondary | ICD-10-CM

## 2014-07-23 DIAGNOSIS — R030 Elevated blood-pressure reading, without diagnosis of hypertension: Secondary | ICD-10-CM | POA: Diagnosis present

## 2014-07-23 DIAGNOSIS — Z3A35 35 weeks gestation of pregnancy: Secondary | ICD-10-CM | POA: Diagnosis not present

## 2014-07-23 DIAGNOSIS — O163 Unspecified maternal hypertension, third trimester: Secondary | ICD-10-CM

## 2014-07-23 DIAGNOSIS — O1414 Severe pre-eclampsia complicating childbirth: Secondary | ICD-10-CM | POA: Insufficient documentation

## 2014-07-23 DIAGNOSIS — O09213 Supervision of pregnancy with history of pre-term labor, third trimester: Secondary | ICD-10-CM

## 2014-07-23 LAB — COMPREHENSIVE METABOLIC PANEL
ALBUMIN: 2.7 g/dL — AB (ref 3.5–5.2)
ALK PHOS: 58 U/L (ref 39–117)
ALT: 11 U/L (ref 0–35)
AST: 19 U/L (ref 0–37)
Anion gap: 3 — ABNORMAL LOW (ref 5–15)
BUN: 10 mg/dL (ref 6–23)
CO2: 23 mmol/L (ref 19–32)
Calcium: 8.7 mg/dL (ref 8.4–10.5)
Chloride: 108 mmol/L (ref 96–112)
Creatinine, Ser: 0.66 mg/dL (ref 0.50–1.10)
GFR calc Af Amer: >90 mL/min (ref >90)
GFR calc non Af Amer: >90 mL/min (ref >90)
Glucose, Bld: 134 mg/dL — ABNORMAL HIGH (ref 70–99)
Potassium: 4 mmol/L (ref 3.5–5.1)
Sodium: 134 mmol/L — ABNORMAL LOW (ref 135–145)
TOTAL PROTEIN: 7.2 g/dL (ref 6.0–8.3)
Total Bilirubin: 0.5 mg/dL (ref 0.3–1.2)

## 2014-07-23 LAB — PROTEIN / CREATININE RATIO, URINE
Creatinine, Urine: 203 mg/dL
Protein Creatinine Ratio: 0.73 — ABNORMAL HIGH (ref 0.00–0.15)
TOTAL PROTEIN, URINE: 148 mg/dL

## 2014-07-23 LAB — POCT URINALYSIS DIP (DEVICE)
BILIRUBIN URINE: NEGATIVE
Ketones, ur: NEGATIVE mg/dL
Leukocytes, UA: NEGATIVE
Nitrite: NEGATIVE
Protein, ur: 100 mg/dL — AB
SPECIFIC GRAVITY, URINE: 1.025 (ref 1.005–1.030)
Urobilinogen, UA: 4 mg/dL — ABNORMAL HIGH (ref 0.0–1.0)
pH: 6 (ref 5.0–8.0)

## 2014-07-23 LAB — CBC
HCT: 35.5 % — ABNORMAL LOW (ref 36.0–46.0)
Hemoglobin: 12 g/dL (ref 12.0–15.0)
MCH: 30.2 pg (ref 26.0–34.0)
MCHC: 33.8 g/dL (ref 30.0–36.0)
MCV: 89.4 fL (ref 78.0–100.0)
PLATELETS: 233 10*3/uL (ref 150–400)
RBC: 3.97 MIL/uL (ref 3.87–5.11)
RDW: 12.8 % (ref 11.5–15.5)
WBC: 7.9 10*3/uL (ref 4.0–10.5)

## 2014-07-23 LAB — URINALYSIS, ROUTINE W REFLEX MICROSCOPIC
BILIRUBIN URINE: NEGATIVE
Glucose, UA: >1000 mg/dL — AB
Ketones, ur: NEGATIVE mg/dL
Leukocytes, UA: NEGATIVE
Nitrite: NEGATIVE
Protein, ur: 100 mg/dL — AB
Specific Gravity, Urine: >1.03 — ABNORMAL HIGH (ref 1.005–1.030)
UROBILINOGEN UA: 2 mg/dL — AB (ref 0.0–1.0)
pH: 5.5 (ref 5.0–8.0)

## 2014-07-23 LAB — URINE MICROSCOPIC-ADD ON

## 2014-07-23 LAB — US OB FOLLOW UP

## 2014-07-23 LAB — GLUCOSE, CAPILLARY: Glucose-Capillary: 123 mg/dL — ABNORMAL HIGH (ref 70–99)

## 2014-07-23 MED ORDER — VALACYCLOVIR HCL 500 MG PO TABS: 500.0000 mg | ORAL_TABLET | Freq: Two times a day (BID) | ORAL | Status: DC

## 2014-07-23 MED ORDER — VALACYCLOVIR HCL 500 MG PO TABS
500.0000 mg | ORAL_TABLET | Freq: Two times a day (BID) | ORAL | Status: DC
Start: 2014-07-23 — End: 2015-10-23

## 2014-07-23 NOTE — Patient Instructions (Signed)

## 2014-07-23 NOTE — Progress Notes (Signed)
Discussed signs and sxs of preeclampsia. Hand out and instructions given. Given supplies for 24 hour urine. Discussed procedure for collection.

## 2014-07-23 NOTE — Discharge Instructions (Signed)
Hypertension During Pregnancy Hypertension is also called high blood pressure. Blood pressure moves blood in your body. Sometimes, the force that moves the blood becomes too strong. When you are pregnant, this condition should be watched carefully. It can cause problems for you and your baby. HOME CARE   Make and keep all of your doctor visits.  Take medicine as told by your doctor. Tell your doctor about all medicines you take.  Eat very little salt.  Exercise regularly.  Do not drink alcohol.  Do not smoke.  Do not have drinks with caffeine.  Lie on your left side when resting.  Your health care provider may ask you to take one low-dose aspirin (81mg ) each day. GET HELP RIGHT AWAY IF:  You have bad belly (abdominal) pain.  You have sudden puffiness (swelling) in the hands, ankles, or face.  You gain 4 pounds (1.8 kilograms) or more in 1 week.  You throw up (vomit) repeatedly.  You have bleeding from the vagina.  You do not feel the baby moving as much.  You have a headache.  You have blurred or double vision.  You have muscle twitching or spasms.  You have shortness of breath.  You have blue fingernails and lips.  You have blood in your pee (urine). MAKE SURE YOU:  Understand these instructions.  Will watch your condition.  Will get help right away if you are not doing well or get worse. Document Released: 06/19/2010 Document Revised: 10/01/2013 Document Reviewed: 12/14/2012 [facility] Patient Information 2015 [place], [place]. This information is not intended to replace advice given to you by your health care provider. Make sure you discuss any questions you have with your health care provider.   Preeclampsia and Eclampsia Preeclampsia is a serious condition that develops only during pregnancy. It is also called toxemia of pregnancy. This condition causes high blood pressure along with other symptoms, such as swelling and headaches. These may develop as the  condition gets worse. Preeclampsia may occur 20 weeks or later into your pregnancy.  Diagnosing and treating preeclampsia early is very important. If not treated early, it can cause serious problems for you and your baby. One problem it can lead to is eclampsia, which is a condition that causes muscle jerking or shaking (convulsions) in the mother. Delivering your baby is the best treatment for preeclampsia or eclampsia.  RISK FACTORS The cause of preeclampsia is not known. You may be more likely to develop preeclampsia if you have certain risk factors. These include:   Being pregnant for the first time.  Having preeclampsia in a past pregnancy.  Having a family history of preeclampsia.  Having high blood pressure.  Being pregnant with twins or triplets.  Being 56 or older.  Being African American.  Having kidney disease or diabetes.  Having medical conditions such as lupus or blood diseases.  Being very overweight (obese). SIGNS AND SYMPTOMS  The earliest signs of preeclampsia are:  High blood pressure.  Increased protein in your urine. Your health care provider will check for this at every prenatal visit. Other symptoms that can develop include:   Severe headaches.  Sudden weight gain.  Swelling of your hands, face, legs, and feet.  Feeling sick to your stomach (nauseous) and throwing up (vomiting).  Vision problems (blurred or double vision).  Numbness in your face, arms, legs, and feet.  Dizziness.  Slurred speech.  Sensitivity to bright lights.  Abdominal pain. DIAGNOSIS  There are no screening tests for preeclampsia. Your health  care provider will ask you about symptoms and check for signs of preeclampsia during your prenatal visits. You may also have tests, including:  Urine testing.  Blood testing.  Checking your baby's heart rate.  Checking the health of your baby and your placenta using images created with sound waves (ultrasound). TREATMENT    You can work out the best treatment approach together with your health care provider. It is very important to keep all prenatal appointments. If you have an increased risk of preeclampsia, you may need more frequent prenatal exams.  Your health care provider may prescribe bed rest.  You may have to eat as little salt as possible.  You may need to take medicine to lower your blood pressure if the condition does not respond to more conservative measures.  You may need to stay in the hospital if your condition is severe. There, treatment will focus on controlling your blood pressure and fluid retention. You may also need to take medicine to prevent seizures.  If the condition gets worse, your baby may need to be delivered early to protect you and the baby. You may have your labor started with medicine (be induced), or you may have a cesarean delivery.  Preeclampsia usually goes away after the baby is born. HOME CARE INSTRUCTIONS   Only take over-the-counter or prescription medicines as directed by your health care provider.  Lie on your left side while resting. This keeps pressure off your baby.  Elevate your feet while resting.  Get regular exercise. Ask your health care provider what type of exercise is safe for you.  Avoid caffeine and alcohol.  Do not smoke.  Drink 6-8 glasses of water every day.  Eat a balanced diet that is low in salt. Do not add salt to your food.  Avoid stressful situations as much as possible.  Get plenty of rest and sleep.  Keep all prenatal appointments and tests as scheduled. SEEK MEDICAL CARE IF:  You are gaining more weight than expected.  You have any headaches, abdominal pain, or nausea.  You are bruising more than usual.  You feel dizzy or light-headed. SEEK IMMEDIATE MEDICAL CARE IF:   You develop sudden or severe swelling anywhere in your body. This usually happens in the legs.  You gain 5 lb (2.3 kg) or more in a week.  You have a  severe headache, dizziness, problems with your vision, or confusion.  You have severe abdominal pain.  You have lasting nausea or vomiting.  You have a seizure.  You have trouble moving any part of your body.  You develop numbness in your body.  You have trouble speaking.  You have any abnormal bleeding.  You develop a stiff neck.  You pass out. MAKE SURE YOU:   Understand these instructions.  Will watch your condition.  Will get help right away if you are not doing well or get worse. Document Released: 05/14/2000 Document Revised: 05/22/2013 Document Reviewed: 03/09/2013 [facility] Patient Information 2015 [place], [place]. This information is not intended to replace advice given to you by your health care provider. Make sure you discuss any questions you have with your health care provider.

## 2014-07-23 NOTE — Progress Notes (Signed)
Pt states she ran out of ASA but will obtain more today. She also did not fill Rx for Valtrex and needs new Rx - provided. Pt states has been having mucous d/c x3 days. Weight gain of 5 lb since 2/8 noted.  Pt has not had timely visit w/PCP for blood sugar follow up- states she had no minutes on her phone to call and schedule appt. Pt was offered to have appt scheduled for her while she was here and declined stating she will call for appt later today. Pt taken to MAU for evaluation due to elevated BP.

## 2014-07-23 NOTE — MAU Note (Signed)
Urine in lab 

## 2014-07-23 NOTE — Progress Notes (Signed)
Seen during NST.  NST reactive and irregular contractions seen. States has had some brown mucous for a "while".  Not sure if she feels contractions.  New Onset Hypertension today with proteinuria, mild edema and headache. Will send to MAU for Preeclampsia evaluation Diabetes:  Missed last appt with Dr Loanne Drilling. Is on pump, but admits to several days last week where she "forgot to get my pump restarted and didn't have my insulin with me, so maybe it was off for a couple of hours" States does not write down sugars. States Fastings are "all good, under 200"  States had one value of 50, but most are "under 150" .  Other sugars during the day "are pretty good" but would not tell me values.  Will add CBG and A1C to labs today.  Has not gotten Valtrex filled yet.

## 2014-07-23 NOTE — Progress Notes (Signed)
Dr Nehemiah Settle notified of pt's lab results

## 2014-07-23 NOTE — MAU Note (Signed)
Sent from clinic for further evaluation of elevated BP

## 2014-07-23 NOTE — MAU Provider Note (Signed)
History     CSN: [id number]  Arrival date and time: 07/23/14 1219   First Provider Initiated Contact with Patient 07/23/14 1316      Chief Complaint  Patient presents with  . Hypertension   HPI Mrs. Mckneely is a 24yo female presenting today from clinic for possible Pre-eclampsia. Denies history of pre-eclampsia with previous pregnancies. Notes back pain. Complains of one contraction, but denies any regular contractions. Denies loss of fluid. Continues to note fetal movement. Denies vaginal bleeding. Denies headache, blurred vision, dizziness. Denies any other complaints.   OB History    Gravida Para Term Preterm AB TAB SAB Ectopic Multiple Living   _0      Past Medical History  Diagnosis Date  . Diabetes mellitus   . Juvenile diabetes mellitus   . Juvenile diabetes 2000  . Neuropathy   . High cholesterol   . HSV-2 infection   . STD (sexually transmitted disease)     Past Surgical History  Procedure Laterality Date  . Cesarean section    . Wisdom tooth extraction    . Cesarean section N/A 07/05/2013    Procedure: CESAREAN SECTION;  Surgeon: Emily Filbert, MD;  Location: [facility];  Service: Obstetrics;  Laterality: N/A;    Family History  Problem Relation Age of Onset  . Stroke Mother   . Hypertension Mother   . Heart disease Mother   . Kidney disease Mother   . Hyperlipidemia Father   . Diabetes Father     History  Substance Use Topics  . Smoking status: Former Research scientist (life sciences)  . Smokeless tobacco: Never Used  . Alcohol Use: No    Allergies:  Allergies  Allergen Reactions  . Hydromorphone Hcl Rash    Facility-administered medications prior to admission  Medication Dose Route Frequency Provider Last Rate Last Dose  . hydroxyprogesterone caproate (DELALUTIN) 250 mg/mL injection 250 mg  250 mg Intramuscular Weekly Shelbie Hutching, MD   250 mg at 07/23/14 1211   Prescriptions prior to admission  Medication Sig Dispense Refill Last Dose  . aspirin  81 MG chewable tablet Chew 1 tablet (81 mg total) by mouth daily. (Patient not taking: Reported on 07/23/2014) 60 tablet 3 Not Taking  . insulin aspart (NOVOLOG) 100 UNIT/ML injection For use in pump, total of 50 units per day 2 vial PRN Taking  . Insulin Disposable Pump (V-GO 20) KIT 1 Device per day (30 units per month) DX CODE: O24.012 30 kit 12 Taking  . Insulin Syringe-Needle U-100 (ADVOCATE INSULIN SYRINGE) 31G X 5/16" 1 ML MISC Use to administer insulin 100 each 0 Taking  . Prenatal Vit-Fe Fumarate-FA (PRENATAL VITAMINS PLUS) 27-1 MG TABS Take 1 tablet by mouth daily. (Patient not taking: Reported on 07/23/2014) 30 tablet 5 Not Taking  . valACYclovir (VALTREX) 500 MG tablet Take 1 tablet (500 mg total) by mouth 2 (two) times daily. 6 tablet 0     ROS Physical Exam   Blood pressure 138/93, pulse 93, not currently breastfeeding.  Physical Exam  Constitutional: She is oriented to person, place, and time. She appears well-developed and well-nourished.  Cardiovascular: Normal rate.   Respiratory: Effort normal.  GI: Soft. Bowel sounds are normal. She exhibits no distension and no mass. There is no tenderness. There is no rebound and no guarding.  Neurological: She is alert and oriented to person, place, and time.  Skin: Skin is warm and dry. No rash noted. No erythema. No pallor.  Psychiatric: She has a normal mood and affect. Her behavior is normal. Judgment and thought content normal.   Fetal Monitor: Baseline 135 bpm, reactive  Results for orders placed or performed during the hospital encounter of 07/23/14 (from the past 24 hour(s))  Protein / creatinine ratio, urine     Status: Abnormal   Collection Time: 07/23/14 12:30 PM  Result Value Ref Range   Creatinine, Urine 203.00 mg/dL   Total Protein, Urine 148 mg/dL   Protein Creatinine Ratio 0.73 (H) 0.00 - 0.15  Urinalysis, Routine w reflex microscopic     Status: Abnormal   Collection Time: 07/23/14 12:30 PM  Result Value Ref  Range   Color, Urine YELLOW YELLOW   APPearance CLEAR CLEAR   Specific Gravity, Urine >1.030 (H) 1.005 - 1.030   pH 5.5 5.0 - 8.0   Glucose, UA >1000 (A) NEGATIVE mg/dL   Hgb urine dipstick SMALL (A) NEGATIVE   Bilirubin Urine NEGATIVE NEGATIVE   Ketones, ur NEGATIVE NEGATIVE mg/dL   Protein, ur 100 (A) NEGATIVE mg/dL   Urobilinogen, UA 2.0 (H) 0.0 - 1.0 mg/dL   Nitrite NEGATIVE NEGATIVE   Leukocytes, UA NEGATIVE NEGATIVE  Urine microscopic-add on     Status: Abnormal   Collection Time: 07/23/14 12:30 PM  Result Value Ref Range   Squamous Epithelial / LPF FEW (A) RARE   WBC, UA 7-10 <3 WBC/hpf   RBC / HPF 7-10 <3 RBC/hpf   Bacteria, UA FEW (A) RARE   Urine-Other MUCOUS PRESENT   CBC     Status: Abnormal   Collection Time: 07/23/14 12:40 PM  Result Value Ref Range   WBC 7.9 4.0 - 10.5 K/uL   RBC 3.97 3.87 - 5.11 MIL/uL   Hemoglobin 12.0 12.0 - 15.0 g/dL   HCT 35.5 (L) 36.0 - 46.0 %   MCV 89.4 78.0 - 100.0 fL   MCH 30.2 26.0 - 34.0 pg   MCHC 33.8 30.0 - 36.0 g/dL   RDW 12.8 11.5 - 15.5 %   Platelets 233 150 - 400 K/uL  Comprehensive metabolic panel     Status: Abnormal   Collection Time: 07/23/14 12:40 PM  Result Value Ref Range   Sodium 134 (L) 135 - 145 mmol/L   Potassium 4.0 3.5 - 5.1 mmol/L   Chloride 108 96 - 112 mmol/L   CO2 23 19 - 32 mmol/L   Glucose, Bld 134 (H) 70 - 99 mg/dL   BUN 10 6 - 23 mg/dL   Creatinine, Ser 0.66 0.50 - 1.10 mg/dL   Calcium 8.7 8.4 - 10.5 mg/dL   Total Protein 7.2 6.0 - 8.3 g/dL   Albumin 2.7 (L) 3.5 - 5.2 g/dL   AST 19 0 - 37 U/L   ALT 11 0 - 35 U/L   Alkaline Phosphatase 58 39 - 117 U/L   Total Bilirubin 0.5 0.3 - 1.2 mg/dL   GFR calc non Af Amer >90 >90 mL/min   GFR calc Af Amer >90 >90 mL/min   Anion gap 3 (L) 5 - 15  Glucose, capillary     Status: Abnormal   Collection Time: 07/23/14 12:43 PM  Result Value Ref Range   Glucose-Capillary 123 (H) 70 - 99 mg/dL    MAU Course  Procedures  MDM Patient preformed 24 hr urine  for protein 05/08/14 showing >615m of protein.  Patient asymptomatic for severe features - no headache, vision changes, etc.     Assessment and Plan   Problem List Items Addressed  This Visit    Hypertension in pregnancy, antepartum - Primary   Relevant Orders   Discharge patient     1.  Hypertension in pregnancy. 2.  Reactive NST    Will obtain 24 hr urine.  As the P:C ratio today does not suggest worsening proteinuria and without severe feature, I am okay with this being managed outpatient.  Preeclampsia signs and symptoms discussed and patient instructed to return with any headache, vision changes, abdominal pain, or nausea.  Additionally, labor and fetal movement precautions given.  Truett Mainland, DO 07/23/2014 2:50 PM

## 2014-07-24 LAB — HEMOGLOBIN A1C
Hgb A1c MFr Bld: 9.6 % — ABNORMAL HIGH (ref 4.8–5.6)
MEAN PLASMA GLUCOSE: 229 mg/dL

## 2014-07-25 ENCOUNTER — Inpatient Hospital Stay (HOSPITAL_COMMUNITY)
Admission: AD | Admit: 2014-07-25 | Discharge: 2014-07-28 | DRG: 765 | Disposition: A | Discharge status: Home / Self Care | Payer: Medicaid Other | Source: Ambulatory Visit | Attending: Family Medicine | Admitting: Family Medicine

## 2014-07-25 ENCOUNTER — Telehealth: Payer: Self-pay | Admitting: Endocrinology

## 2014-07-25 ENCOUNTER — Encounter (HOSPITAL_COMMUNITY): Payer: Self-pay | Admitting: General Practice

## 2014-07-25 ENCOUNTER — Encounter (HOSPITAL_COMMUNITY): Payer: Self-pay | Admitting: Anesthesiology

## 2014-07-25 ENCOUNTER — Encounter (HOSPITAL_COMMUNITY)
Admission: AD | Disposition: A | Discharge status: Home / Self Care | Payer: Self-pay | Source: Ambulatory Visit | Attending: Family Medicine

## 2014-07-25 ENCOUNTER — Inpatient Hospital Stay (HOSPITAL_COMMUNITY): Payer: Medicaid Other | Admitting: Anesthesiology

## 2014-07-25 DIAGNOSIS — A6000 Herpesviral infection of urogenital system, unspecified: Secondary | ICD-10-CM | POA: Diagnosis present

## 2014-07-25 DIAGNOSIS — O0933 Supervision of pregnancy with insufficient antenatal care, third trimester: Secondary | ICD-10-CM

## 2014-07-25 DIAGNOSIS — Z794 Long term (current) use of insulin: Secondary | ICD-10-CM

## 2014-07-25 DIAGNOSIS — O09893 Supervision of other high risk pregnancies, third trimester: Secondary | ICD-10-CM

## 2014-07-25 DIAGNOSIS — N858 Other specified noninflammatory disorders of uterus: Secondary | ICD-10-CM | POA: Diagnosis present

## 2014-07-25 DIAGNOSIS — O34219 Maternal care for unspecified type scar from previous cesarean delivery: Secondary | ICD-10-CM

## 2014-07-25 DIAGNOSIS — Z3A35 35 weeks gestation of pregnancy: Secondary | ICD-10-CM | POA: Diagnosis present

## 2014-07-25 DIAGNOSIS — O9832 Other infections with a predominantly sexual mode of transmission complicating childbirth: Secondary | ICD-10-CM | POA: Diagnosis present

## 2014-07-25 DIAGNOSIS — O409XX0 Polyhydramnios, unspecified trimester, not applicable or unspecified: Secondary | ICD-10-CM | POA: Diagnosis present

## 2014-07-25 DIAGNOSIS — Z885 Allergy status to narcotic agent status: Secondary | ICD-10-CM | POA: Diagnosis not present

## 2014-07-25 DIAGNOSIS — O3421 Maternal care for scar from previous cesarean delivery: Secondary | ICD-10-CM | POA: Diagnosis present

## 2014-07-25 DIAGNOSIS — O09899 Supervision of other high risk pregnancies, unspecified trimester: Secondary | ICD-10-CM

## 2014-07-25 DIAGNOSIS — O24013 Pre-existing diabetes mellitus, type 1, in pregnancy, third trimester: Secondary | ICD-10-CM

## 2014-07-25 DIAGNOSIS — O09213 Supervision of pregnancy with history of pre-term labor, third trimester: Secondary | ICD-10-CM

## 2014-07-25 DIAGNOSIS — Z7982 Long term (current) use of aspirin: Secondary | ICD-10-CM

## 2014-07-25 DIAGNOSIS — O0993 Supervision of high risk pregnancy, unspecified, third trimester: Secondary | ICD-10-CM

## 2014-07-25 DIAGNOSIS — O2402 Pre-existing diabetes mellitus, type 1, in childbirth: Secondary | ICD-10-CM | POA: Diagnosis present

## 2014-07-25 DIAGNOSIS — O1413 Severe pre-eclampsia, third trimester: Secondary | ICD-10-CM | POA: Diagnosis present

## 2014-07-25 DIAGNOSIS — O42913 Preterm premature rupture of membranes, unspecified as to length of time between rupture and onset of labor, third trimester: Principal | ICD-10-CM | POA: Diagnosis present

## 2014-07-25 DIAGNOSIS — O1403 Mild to moderate pre-eclampsia, third trimester: Secondary | ICD-10-CM | POA: Diagnosis not present

## 2014-07-25 DIAGNOSIS — O42919 Preterm premature rupture of membranes, unspecified as to length of time between rupture and onset of labor, unspecified trimester: Secondary | ICD-10-CM | POA: Diagnosis present

## 2014-07-25 DIAGNOSIS — Z87891 Personal history of nicotine dependence: Secondary | ICD-10-CM

## 2014-07-25 DIAGNOSIS — E109 Type 1 diabetes mellitus without complications: Secondary | ICD-10-CM | POA: Diagnosis present

## 2014-07-25 DIAGNOSIS — O163 Unspecified maternal hypertension, third trimester: Secondary | ICD-10-CM

## 2014-07-25 DIAGNOSIS — O24019 Pre-existing diabetes mellitus, type 1, in pregnancy, unspecified trimester: Secondary | ICD-10-CM | POA: Diagnosis present

## 2014-07-25 DIAGNOSIS — O1414 Severe pre-eclampsia complicating childbirth: Secondary | ICD-10-CM | POA: Diagnosis present

## 2014-07-25 DIAGNOSIS — O4290 Premature rupture of membranes, unspecified as to length of time between rupture and onset of labor, unspecified weeks of gestation: Secondary | ICD-10-CM | POA: Diagnosis present

## 2014-07-25 LAB — COMPREHENSIVE METABOLIC PANEL
ALBUMIN: 2.8 g/dL — AB (ref 3.5–5.2)
ALT: 10 U/L (ref 0–35)
ANION GAP: 4 — AB (ref 5–15)
AST: 18 U/L (ref 0–37)
Alkaline Phosphatase: 57 U/L (ref 39–117)
BUN: 9 mg/dL (ref 6–23)
CO2: 25 mmol/L (ref 19–32)
Calcium: 9.1 mg/dL (ref 8.4–10.5)
Chloride: 104 mmol/L (ref 96–112)
Creatinine, Ser: 0.69 mg/dL (ref 0.50–1.10)
GFR calc non Af Amer: >90 mL/min (ref >90)
GLUCOSE: 109 mg/dL — AB (ref 70–99)
Potassium: 3.9 mmol/L (ref 3.5–5.1)
Sodium: 133 mmol/L — ABNORMAL LOW (ref 135–145)
TOTAL PROTEIN: 7.6 g/dL (ref 6.0–8.3)
Total Bilirubin: 0.6 mg/dL (ref 0.3–1.2)

## 2014-07-25 LAB — GLUCOSE, CAPILLARY
GLUCOSE-CAPILLARY: 70 mg/dL (ref 70–99)
GLUCOSE-CAPILLARY: 85 mg/dL (ref 70–99)
GLUCOSE-CAPILLARY: 180 mg/dL — AB (ref 70–99)
Glucose-Capillary: 82 mg/dL (ref 70–99)
Glucose-Capillary: 310 mg/dL — ABNORMAL HIGH (ref 70–99)
Glucose-Capillary: 173 mg/dL — ABNORMAL HIGH (ref 70–99)
Glucose-Capillary: 164 mg/dL — ABNORMAL HIGH (ref 70–99)
Glucose-Capillary: 152 mg/dL — ABNORMAL HIGH (ref 70–99)
Glucose-Capillary: 124 mg/dL — ABNORMAL HIGH (ref 70–99)
Glucose-Capillary: 90 mg/dL (ref 70–99)

## 2014-07-25 LAB — TYPE AND SCREEN
ABO/RH(D): O POS
ANTIBODY SCREEN: NEGATIVE

## 2014-07-25 LAB — CBC
HCT: 37 % (ref 36.0–46.0)
Hemoglobin: 12.6 g/dL (ref 12.0–15.0)
MCH: 30.3 pg (ref 26.0–34.0)
MCHC: 34.1 g/dL (ref 30.0–36.0)
MCV: 88.9 fL (ref 78.0–100.0)
PLATELETS: 260 10*3/uL (ref 150–400)
RBC: 4.16 MIL/uL (ref 3.87–5.11)
RDW: 12.8 % (ref 11.5–15.5)
WBC: 8.2 10*3/uL (ref 4.0–10.5)

## 2014-07-25 LAB — GROUP B STREP BY PCR: Group B strep by PCR: NEGATIVE

## 2014-07-25 SURGERY — Surgical Case
Anesthesia: Spinal | Site: Abdomen

## 2014-07-25 MED ORDER — FENTANYL CITRATE 0.05 MG/ML IJ SOLN
25.0000 ug | INTRAMUSCULAR | Status: DC | PRN
  Administered 2014-07-25 (×2): 25 ug via INTRAVENOUS

## 2014-07-25 MED ORDER — OXYTOCIN 40 UNITS IN LACTATED RINGERS INFUSION - SIMPLE MED: 62.5000 mL/h | INTRAVENOUS | Status: AC

## 2014-07-25 MED ORDER — SIMETHICONE 80 MG PO CHEW
80.0000 mg | CHEWABLE_TABLET | Freq: Three times a day (TID) | ORAL | Status: DC
  Administered 2014-07-26 – 2014-07-27 (×7): 80 mg via ORAL

## 2014-07-25 MED ORDER — SCOPOLAMINE 1 MG/3DAYS TD PT72: 1.0000 | MEDICATED_PATCH | Freq: Once | TRANSDERMAL | Status: DC

## 2014-07-25 MED ORDER — IBUPROFEN 600 MG PO TABS
600.0000 mg | ORAL_TABLET | Freq: Four times a day (QID) | ORAL | Status: DC
  Administered 2014-07-25 – 2014-07-28 (×11): 600 mg via ORAL
  Filled 2014-07-25 (×11): qty 1

## 2014-07-25 MED ORDER — VALACYCLOVIR HCL 500 MG PO TABS
500.0000 mg | ORAL_TABLET | Freq: Two times a day (BID) | ORAL | Status: DC
  Administered 2014-07-25 – 2014-07-27 (×5): 500 mg via ORAL
  Filled 2014-07-25 (×8): qty 1

## 2014-07-25 MED ORDER — CEFAZOLIN SODIUM-DEXTROSE 2-3 GM-% IV SOLR
INTRAVENOUS | Status: AC
  Filled 2014-07-25: qty 50

## 2014-07-25 MED ORDER — MEPERIDINE HCL 25 MG/ML IJ SOLN: 6.2500 mg | INTRAMUSCULAR | Status: DC | PRN

## 2014-07-25 MED ORDER — SODIUM CHLORIDE 0.9 % IV SOLN
INTRAVENOUS | Status: DC
  Administered 2014-07-25: 1 [IU]/h via INTRAVENOUS
  Administered 2014-07-25: 0.9 [IU]/h via INTRAVENOUS
  Administered 2014-07-26: 09:00:00 via INTRAVENOUS
  Filled 2014-07-25: qty 2.5

## 2014-07-25 MED ORDER — SENNOSIDES-DOCUSATE SODIUM 8.6-50 MG PO TABS
2.0000 | ORAL_TABLET | ORAL | Status: DC
  Administered 2014-07-25 – 2014-07-27 (×3): 2 via ORAL
  Filled 2014-07-25 (×3): qty 2

## 2014-07-25 MED ORDER — PHENYLEPHRINE 8 MG IN D5W 100 ML (0.08MG/ML) PREMIX OPTIME
INJECTION | INTRAVENOUS | Status: DC | PRN
  Administered 2014-07-25: 60 ug/min via INTRAVENOUS

## 2014-07-25 MED ORDER — DEXTROSE 5 % IV SOLN
INTRAVENOUS | Status: DC | PRN
  Administered 2014-07-25: 15:00:00 via INTRAVENOUS

## 2014-07-25 MED ORDER — NALOXONE HCL 1 MG/ML IJ SOLN: 1.0000 ug/kg/h | INTRAVENOUS | Status: DC | PRN

## 2014-07-25 MED ORDER — ONDANSETRON HCL 4 MG/2ML IJ SOLN
INTRAMUSCULAR | Status: DC | PRN
  Administered 2014-07-25: 4 mg via INTRAVENOUS

## 2014-07-25 MED ORDER — ACETAMINOPHEN 325 MG PO TABS: 325.0000 mg | ORAL_TABLET | ORAL | Status: DC | PRN

## 2014-07-25 MED ORDER — MEASLES, MUMPS & RUBELLA VAC ~~LOC~~ INJ: 0.5000 mL | INJECTION | Freq: Once | SUBCUTANEOUS | Status: DC

## 2014-07-25 MED ORDER — ONDANSETRON HCL 4 MG/2ML IJ SOLN
4.0000 mg | Freq: Three times a day (TID) | INTRAMUSCULAR | Status: DC | PRN
Start: 2014-07-25 — End: 2014-07-29

## 2014-07-25 MED ORDER — PENICILLIN G POTASSIUM 5000000 UNITS IJ SOLR: 2.5000 10*6.[IU] | INTRAVENOUS | Status: DC

## 2014-07-25 MED ORDER — OXYCODONE-ACETAMINOPHEN 5-325 MG PO TABS
2.0000 | ORAL_TABLET | ORAL | Status: DC | PRN
  Administered 2014-07-27: 2 via ORAL
  Filled 2014-07-25: qty 2

## 2014-07-25 MED ORDER — BUPIVACAINE HCL (PF) 0.25 % IJ SOLN
INTRAMUSCULAR | Status: DC | PRN
  Administered 2014-07-25: 30 mL

## 2014-07-25 MED ORDER — SIMETHICONE 80 MG PO CHEW
80.0000 mg | CHEWABLE_TABLET | ORAL | Status: DC
  Administered 2014-07-25 – 2014-07-27 (×2): 80 mg via ORAL
  Filled 2014-07-25 (×4): qty 1

## 2014-07-25 MED ORDER — DIPHENHYDRAMINE HCL 25 MG PO CAPS
25.0000 mg | ORAL_CAPSULE | ORAL | Status: DC | PRN
  Administered 2014-07-26 (×2): 25 mg via ORAL
  Filled 2014-07-25: qty 1

## 2014-07-25 MED ORDER — IBUPROFEN 600 MG PO TABS
600.0000 mg | ORAL_TABLET | Freq: Four times a day (QID) | ORAL | Status: DC | PRN
Start: 2014-07-25 — End: 2014-07-25

## 2014-07-25 MED ORDER — FENTANYL CITRATE 0.05 MG/ML IJ SOLN
INTRAMUSCULAR | Status: AC
  Filled 2014-07-25: qty 2

## 2014-07-25 MED ORDER — ONDANSETRON HCL 4 MG PO TABS: 4.0000 mg | ORAL_TABLET | ORAL | Status: DC | PRN

## 2014-07-25 MED ORDER — NALOXONE HCL 0.4 MG/ML IJ SOLN: 0.4000 mg | INTRAMUSCULAR | Status: DC | PRN

## 2014-07-25 MED ORDER — MAGNESIUM SULFATE BOLUS VIA INFUSION
4.0000 g | Freq: Once | INTRAVENOUS | Status: AC
  Administered 2014-07-25: 4 g via INTRAVENOUS
  Filled 2014-07-25: qty 500

## 2014-07-25 MED ORDER — PHENYLEPHRINE 8 MG IN D5W 100 ML (0.08MG/ML) PREMIX OPTIME
INJECTION | INTRAVENOUS | Status: AC
  Filled 2014-07-25: qty 100

## 2014-07-25 MED ORDER — FENTANYL CITRATE 0.05 MG/ML IJ SOLN
INTRAMUSCULAR | Status: DC | PRN
  Administered 2014-07-25: 50 ug via INTRAVENOUS
  Administered 2014-07-25: 25 ug via INTRATHECAL

## 2014-07-25 MED ORDER — DEXTROSE 50 % IV SOLN: 25.0000 mL | INTRAVENOUS | Status: DC | PRN

## 2014-07-25 MED ORDER — WITCH HAZEL-GLYCERIN EX PADS: 1.0000 "application " | MEDICATED_PAD | CUTANEOUS | Status: DC | PRN

## 2014-07-25 MED ORDER — NALBUPHINE HCL 10 MG/ML IJ SOLN: 5.0000 mg | Freq: Once | INTRAMUSCULAR | Status: AC | PRN

## 2014-07-25 MED ORDER — DIPHENHYDRAMINE HCL 50 MG/ML IJ SOLN
INTRAMUSCULAR | Status: DC | PRN
  Administered 2014-07-25: 25 mg via INTRAVENOUS

## 2014-07-25 MED ORDER — ACETAMINOPHEN 160 MG/5ML PO SOLN: 325.0000 mg | ORAL | Status: DC | PRN

## 2014-07-25 MED ORDER — BUPIVACAINE IN DEXTROSE 0.75-8.25 % IT SOLN
INTRATHECAL | Status: DC | PRN
  Administered 2014-07-25: 12 mg via INTRATHECAL

## 2014-07-25 MED ORDER — DIBUCAINE 1 % RE OINT: 1.0000 "application " | TOPICAL_OINTMENT | RECTAL | Status: DC | PRN

## 2014-07-25 MED ORDER — MAGNESIUM SULFATE 40 G IN LACTATED RINGERS - SIMPLE
2.0000 g/h | INTRAVENOUS | Status: AC
  Administered 2014-07-25 – 2014-07-26 (×2): 2 g/h via INTRAVENOUS
  Filled 2014-07-25 (×2): qty 500

## 2014-07-25 MED ORDER — MENTHOL 3 MG MT LOZG: 1.0000 | LOZENGE | OROMUCOSAL | Status: DC | PRN

## 2014-07-25 MED ORDER — NALBUPHINE HCL 10 MG/ML IJ SOLN
5.0000 mg | INTRAMUSCULAR | Status: DC | PRN
  Administered 2014-07-25 – 2014-07-26 (×3): 5 mg via INTRAVENOUS
  Filled 2014-07-25 (×4): qty 1

## 2014-07-25 MED ORDER — OXYTOCIN 10 UNIT/ML IJ SOLN
INTRAMUSCULAR | Status: AC
  Filled 2014-07-25: qty 4

## 2014-07-25 MED ORDER — NALBUPHINE HCL 10 MG/ML IJ SOLN
INTRAMUSCULAR | Status: AC
  Filled 2014-07-25: qty 1

## 2014-07-25 MED ORDER — SODIUM CHLORIDE 0.9 % IJ SOLN: 3.0000 mL | INTRAMUSCULAR | Status: DC | PRN

## 2014-07-25 MED ORDER — LANOLIN HYDROUS EX OINT: 1.0000 "application " | TOPICAL_OINTMENT | CUTANEOUS | Status: DC | PRN

## 2014-07-25 MED ORDER — MIDAZOLAM HCL 2 MG/2ML IJ SOLN: 0.5000 mg | Freq: Once | INTRAMUSCULAR | Status: DC | PRN

## 2014-07-25 MED ORDER — DIPHENHYDRAMINE HCL 25 MG PO CAPS
25.0000 mg | ORAL_CAPSULE | Freq: Four times a day (QID) | ORAL | Status: DC | PRN
  Administered 2014-07-27: 25 mg via ORAL
  Filled 2014-07-25 (×3): qty 1

## 2014-07-25 MED ORDER — KETOROLAC TROMETHAMINE 30 MG/ML IJ SOLN
30.0000 mg | Freq: Four times a day (QID) | INTRAMUSCULAR | Status: DC | PRN
Start: 2014-07-25 — End: 2014-07-25

## 2014-07-25 MED ORDER — LACTATED RINGERS IV SOLN
INTRAVENOUS | Status: DC
  Administered 2014-07-25 (×2): via INTRAVENOUS

## 2014-07-25 MED ORDER — PROMETHAZINE HCL 25 MG/ML IJ SOLN: 6.2500 mg | INTRAMUSCULAR | Status: DC | PRN

## 2014-07-25 MED ORDER — ACETAMINOPHEN 500 MG PO TABS
1000.0000 mg | ORAL_TABLET | Freq: Four times a day (QID) | ORAL | Status: AC
  Administered 2014-07-25 – 2014-07-26 (×2): 1000 mg via ORAL
  Filled 2014-07-25 (×2): qty 2

## 2014-07-25 MED ORDER — KETOROLAC TROMETHAMINE 30 MG/ML IJ SOLN: 30.0000 mg | Freq: Once | INTRAMUSCULAR | Status: DC | PRN

## 2014-07-25 MED ORDER — OXYCODONE-ACETAMINOPHEN 5-325 MG PO TABS
1.0000 | ORAL_TABLET | ORAL | Status: DC | PRN
  Administered 2014-07-26 – 2014-07-28 (×6): 1 via ORAL
  Filled 2014-07-25 (×7): qty 1

## 2014-07-25 MED ORDER — TETANUS-DIPHTH-ACELL PERTUSSIS 5-2.5-18.5 LF-MCG/0.5 IM SUSP: 0.5000 mL | Freq: Once | INTRAMUSCULAR | Status: DC

## 2014-07-25 MED ORDER — CEFAZOLIN SODIUM-DEXTROSE 2-3 GM-% IV SOLR
2.0000 g | Freq: Once | INTRAVENOUS | Status: AC
  Administered 2014-07-25: 2 g via INTRAVENOUS

## 2014-07-25 MED ORDER — ZOLPIDEM TARTRATE 5 MG PO TABS: 5.0000 mg | ORAL_TABLET | Freq: Every evening | ORAL | Status: DC | PRN

## 2014-07-25 MED ORDER — ONDANSETRON HCL 4 MG/2ML IJ SOLN
INTRAMUSCULAR | Status: AC
  Filled 2014-07-25: qty 2

## 2014-07-25 MED ORDER — OXYTOCIN 10 UNIT/ML IJ SOLN
40.0000 [IU] | INTRAVENOUS | Status: DC | PRN
  Administered 2014-07-25: 40 [IU] via INTRAVENOUS

## 2014-07-25 MED ORDER — NALBUPHINE HCL 10 MG/ML IJ SOLN
5.0000 mg | INTRAMUSCULAR | Status: DC | PRN
  Administered 2014-07-25: 5 mg via SUBCUTANEOUS

## 2014-07-25 MED ORDER — MORPHINE SULFATE (PF) 0.5 MG/ML IJ SOLN
INTRAMUSCULAR | Status: DC | PRN
  Administered 2014-07-25: .15 mg via INTRATHECAL

## 2014-07-25 MED ORDER — DIPHENHYDRAMINE HCL 50 MG/ML IJ SOLN
INTRAMUSCULAR | Status: AC
  Filled 2014-07-25: qty 1

## 2014-07-25 MED ORDER — PRENATAL MULTIVITAMIN CH
1.0000 | ORAL_TABLET | Freq: Every day | ORAL | Status: DC
  Administered 2014-07-26 – 2014-07-27 (×2): 1 via ORAL
  Filled 2014-07-25 (×2): qty 1

## 2014-07-25 MED ORDER — 0.9 % SODIUM CHLORIDE (POUR BTL) OPTIME
TOPICAL | Status: DC | PRN
  Administered 2014-07-25: 1000 mL

## 2014-07-25 MED ORDER — MORPHINE SULFATE 0.5 MG/ML IJ SOLN
INTRAMUSCULAR | Status: AC
Start: 2014-07-25 — End: 2014-07-25
  Filled 2014-07-25: qty 10

## 2014-07-25 MED ORDER — KETOROLAC TROMETHAMINE 30 MG/ML IJ SOLN: 30.0000 mg | Freq: Four times a day (QID) | INTRAMUSCULAR | Status: DC | PRN

## 2014-07-25 MED ORDER — ONDANSETRON HCL 4 MG/2ML IJ SOLN: 4.0000 mg | INTRAMUSCULAR | Status: DC | PRN

## 2014-07-25 MED ORDER — BUPIVACAINE HCL (PF) 0.25 % IJ SOLN
INTRAMUSCULAR | Status: AC
  Filled 2014-07-25: qty 30

## 2014-07-25 MED ORDER — SODIUM CHLORIDE 0.9 % IV SOLN
INTRAVENOUS | Status: DC
  Administered 2014-07-25 – 2014-07-26 (×2): via INTRAVENOUS

## 2014-07-25 MED ORDER — DIPHENHYDRAMINE HCL 50 MG/ML IJ SOLN
12.5000 mg | INTRAMUSCULAR | Status: DC | PRN
  Administered 2014-07-25: 12.5 mg via INTRAVENOUS
  Filled 2014-07-25: qty 1

## 2014-07-25 MED ORDER — SIMETHICONE 80 MG PO CHEW
80.0000 mg | CHEWABLE_TABLET | ORAL | Status: DC | PRN
  Filled 2014-07-25 (×5): qty 1

## 2014-07-25 MED ORDER — TETANUS-DIPHTH-ACELL PERTUSSIS 5-2.5-18.5 LF-MCG/0.5 IM SUSP
0.5000 mL | Freq: Once | INTRAMUSCULAR | Status: DC
Start: 2014-07-26 — End: 2014-07-28

## 2014-07-25 MED ORDER — INSULIN REGULAR BOLUS VIA INFUSION
0.0000 [IU] | Freq: Three times a day (TID) | INTRAVENOUS | Status: DC
  Administered 2014-07-26: 7 [IU] via INTRAVENOUS
  Administered 2014-07-26: 6 [IU] via INTRAVENOUS
  Filled 2014-07-25: qty 10

## 2014-07-25 MED ORDER — PENICILLIN G POTASSIUM 5000000 UNITS IJ SOLR
5.0000 10*6.[IU] | Freq: Once | INTRAVENOUS | Status: DC
  Filled 2014-07-25: qty 5

## 2014-07-25 SURGICAL SUPPLY — 34 items
APL SKNCLS STERI-STRIP NONHPOA (GAUZE/BANDAGES/DRESSINGS) ×1
BENZOIN TINCTURE PRP APPL 2/3 (GAUZE/BANDAGES/DRESSINGS) ×2 IMPLANT
CLAMP CORD UMBIL (MISCELLANEOUS) IMPLANT
CLOSURE WOUND 1/2 X4 (GAUZE/BANDAGES/DRESSINGS) ×1
CLOTH BEACON ORANGE TIMEOUT ST (SAFETY) ×3 IMPLANT
DRAPE SHEET LG 3/4 BI-LAMINATE (DRAPES) IMPLANT
DRSG OPSITE POSTOP 4X10 (GAUZE/BANDAGES/DRESSINGS) ×3 IMPLANT
DURAPREP 26ML APPLICATOR (WOUND CARE) ×3 IMPLANT
ELECT REM PT RETURN 9FT ADLT (ELECTROSURGICAL) ×3
ELECTRODE REM PT RTRN 9FT ADLT (ELECTROSURGICAL) ×1 IMPLANT
EXTRACTOR VACUUM M CUP 4 TUBE (SUCTIONS) IMPLANT
EXTRACTOR VACUUM M CUP 4' TUBE (SUCTIONS)
GLOVE BIOGEL PI IND STRL 7.0 (GLOVE) ×1 IMPLANT
GLOVE BIOGEL PI INDICATOR 7.0 (GLOVE) ×2
GLOVE ECLIPSE 7.0 STRL STRAW (GLOVE) ×6 IMPLANT
GOWN STRL REUS W/TWL LRG LVL3 (GOWN DISPOSABLE) ×6 IMPLANT
KIT ABG SYR 3ML LUER SLIP (SYRINGE) IMPLANT
NDL HYPO 25X5/8 SAFETYGLIDE (NEEDLE) IMPLANT
NEEDLE HYPO 22GX1.5 SAFETY (NEEDLE) ×3 IMPLANT
NEEDLE HYPO 25X5/8 SAFETYGLIDE (NEEDLE) IMPLANT
NS IRRIG 1000ML POUR BTL (IV SOLUTION) ×3 IMPLANT
PACK C SECTION WH (CUSTOM PROCEDURE TRAY) ×3 IMPLANT
PAD ABD 8X7 1/2 STERILE (GAUZE/BANDAGES/DRESSINGS) ×2 IMPLANT
PAD OB MATERNITY 4.3X12.25 (PERSONAL CARE ITEMS) ×3 IMPLANT
RTRCTR C-SECT PINK 25CM LRG (MISCELLANEOUS) ×3 IMPLANT
SPONGE GAUZE 4X4 12PLY STER LF (GAUZE/BANDAGES/DRESSINGS) ×4 IMPLANT
STAPLER VISISTAT 35W (STAPLE) IMPLANT
STRIP CLOSURE SKIN 1/2X4 (GAUZE/BANDAGES/DRESSINGS) ×1 IMPLANT
SUT VIC AB 0 CTX 36 (SUTURE) ×9
SUT VIC AB 0 CTX36XBRD ANBCTRL (SUTURE) ×3 IMPLANT
SUT VIC AB 4-0 KS 27 (SUTURE) ×4 IMPLANT
SYR 30ML LL (SYRINGE) ×3 IMPLANT
TOWEL OR 17X24 6PK STRL BLUE (TOWEL DISPOSABLE) ×3 IMPLANT
TRAY FOLEY CATH 14FR (SET/KITS/TRAYS/PACK) ×3 IMPLANT

## 2014-07-25 NOTE — Anesthesia Postprocedure Evaluation (Signed)
  Anesthesia Post Note  Patient: Ann Robertson  Procedure(s) Performed: Procedure(s) (LRB): CESAREAN SECTION (N/A)  Anesthesia type: Spinal  Patient location: PACU  Post pain: Pain level controlled  Post assessment: Post-op Vital signs reviewed  Last Vitals:  Filed Vitals:   07/25/14 1405  BP: 137/96  Pulse: 102  Temp:   Resp:     Post vital signs: Reviewed  Level of consciousness: awake  Complications: No apparent anesthesia complications

## 2014-07-25 NOTE — Transfer of Care (Signed)
Immediate Anesthesia Transfer of Care Note  Patient: Ann Robertson  Procedure(s) Performed: Procedure(s): CESAREAN SECTION (N/A)  Patient Location: PACU  Anesthesia Type:Spinal  Level of Consciousness: awake, alert  and oriented  Airway & Oxygen Therapy: Patient Spontanous Breathing  Post-op Assessment: Report given to RN and Post -op Vital signs reviewed and stable  Post vital signs: Reviewed and stable  Last Vitals:  Filed Vitals:   07/25/14 1405  BP: 137/96  Pulse: 102  Temp:   Resp:     Complications: No apparent anesthesia complications

## 2014-07-25 NOTE — Progress Notes (Addendum)
Inpatient Diabetes Program Recommendations  AACE/ADA: New Consensus Statement on Inpatient Glycemic Control (2013)  Target Ranges:  Prepandial:   less than 140 mg/dL      Peak postprandial:   less than 180 mg/dL (1-2 hours)      Critically ill patients:  140 - 180 mg/dL     Results for AHMIYA, BAUDER (MRN [id number]) as of 07/25/2014 16:47  Ref. Range 07/23/2014 12:43 07/25/2014 13:47 07/25/2014 14:31 07/25/2014 14:44 07/25/2014 15:54  Glucose-Capillary Latest Range: 70-99 mg/dL 123 (H) 82 70 310 (H) 173 (H)     Chief Complaint: C-section this afternoon  History: Type 1 Diabetes diagnosed 1999  Home DM Meds: V-Go 20 insulin pump (20 units basal per 24 hour period, each "click" delivers 2 unit bolus- pt was using 1 click if no meal eaten, 3 clicks if meal eaten + 1 extra click if CBG [id number] mg/dl or 2 extra clicks if CBG [id number] mg/dl)  Current DM Orders: IV insulin drip    **Paged by RN several times about this patient.  Per RN, patient underwent C-Section today and normally uses insulin pump at home prior to delivery.  **Note Dr. Loanne Drilling spoke with Dr. Kennon Rounds today via phone and recommended we start Lantus 20 units daily + Novolog 5 units tidwc once patient is out of the OR from her C-section.  **CBG at 1444 was 310 mg/dl- Per RN patient was given glucose in the OR for CBG of 70 mg/dl and that is likely the reason glucose rose to 310 mg/dl.  CBG down to 173 mg/dl by 1554pm.  **RN noted that plan now is to manage patient on IV insulin drip b/c patient told OBGYN MD that she does not plan to eat tonight.  Asked RN to please call pharmacy to make sure lactated ringers IVF contains glucose.  Patient will need glucose in IVF while she is being managed on the IV insulin drip.  If lactated ringers does not contain glucose, advised RN to call MD to switch IVF to D5 or D5 1/2 NS at 125 cc/hour.  **I assume the plan is to manage the patient on the IV insulin drip tonight and try to transition patient  to SQ insulin tomorrow AM (02/27).  When patient is ready to transition off the IV insulin drip, she will need basal insulin + Novolog correction scale (SSI).  Note that Dr. Loanne Drilling recommended 20 units Lantus and Novolog 5 units tidwc.  Not sure if patient will need that much basal insulin as patients are usually very sensitive to insulin after delivery and require lower doses of basal insulin after delivery (patient was getting 20 units basal insulin on her V-Go pump prior to delivery).  **Please have OBGYN MD contact Dr. Loanne Drilling for further recommendations if needed.  DM Coordinator will also be glad to assist in blood glucose management post-partum as well.    Will follow Wyn Quaker RN, MSN, CDE Diabetes Coordinator Inpatient Diabetes Program Team Pager: [phone] (8a-10p)

## 2014-07-25 NOTE — Telephone Encounter (Signed)
Could you see note below and please advise, Thanks!

## 2014-07-25 NOTE — H&P (Signed)
LABOR ADMISSION HISTORY AND PHYSICAL  Ann Robertson is a 24 y.o. female [phone] with IUP at 63w2dby second trimester UKoreapresenting for rupture of membranes. She reports +FMs, no VB, no blurry vision, headaches. Unsure if having contractions, but reports abdominal pressure, "pinching" in vaginal area, and RUQ pain.  States at 10:00am today she experienced a small trickle of fluid and at first thought it was urine; when the feeling continued, she decided to come to the hospital to get further evaluation.   Was recently seen on 2/23 for evaluation of possible pre-eclampsia and was sent home for 24hr urine. Reports history of proteinurea prior to pregnancy associated with history of diabetes. Has history of two previous C-sections. Last meal consisted of a salad at 10am. She plans on bottle feeding. She requests Depo-provera for birth control at this time, but is still unsure.  Prenatal History/Complications:  Past Medical History: Past Medical History  Diagnosis Date  . Diabetes mellitus   . Juvenile diabetes mellitus   . Juvenile diabetes 2000  . Neuropathy   . High cholesterol   . HSV-2 infection   . STD (sexually transmitted disease)     Past Surgical History: Past Surgical History  Procedure Laterality Date  . Cesarean section    . Wisdom tooth extraction    . Cesarean section N/A 07/05/2013    Procedure: CESAREAN SECTION;  Surgeon: MEmily Filbert MD;  [facility];  Service: Obstetrics;  Laterality: N/A;    Obstetrical History: OB History    Gravida Para Term Preterm AB TAB SAB Ectopic Multiple Living   4 2 0 2 1 0 1 0 0 2       Social History: History   Social History  . Marital Status: Single    Spouse Name: N/A  . Number of Children: N/A  . Years of Education: N/A   Social History Main Topics  . Smoking status: Former SResearch scientist (life sciences) . Smokeless tobacco: Never Used  . Alcohol Use: No  . Drug Use: No  . Sexual Activity: Yes    Birth Control/ Protection: None   Other  Topics Concern  . None   Social History Narrative    Family History: Family History  Problem Relation Age of Onset  . Stroke Mother   . Hypertension Mother   . Heart disease Mother   . Kidney disease Mother   . Hyperlipidemia Father   . Diabetes Father     Allergies: Allergies  Allergen Reactions  . Hydromorphone Hcl Rash    Facility-administered medications prior to admission  Medication Dose Route Frequency Provider Last Rate Last Dose  . hydroxyprogesterone caproate (DELALUTIN) 250 mg/mL injection 250 mg  250 mg Intramuscular Weekly MShelbie Hutching MD   250 mg at 07/23/14 1211   Prescriptions prior to admission  Medication Sig Dispense Refill Last Dose  . aspirin 81 MG chewable tablet Chew 1 tablet (81 mg total) by mouth daily. (Patient not taking: Reported on 07/23/2014) 60 tablet 3 Not Taking  . insulin aspart (NOVOLOG) 100 UNIT/ML injection For use in pump, total of 50 units per day 2 vial PRN Taking  . Insulin Disposable Pump (V-GO 20) KIT 1 Device per day (30 units per month) DX CODE: O24.012 30 kit 12 Taking  . Insulin Syringe-Needle U-100 (ADVOCATE INSULIN SYRINGE) 31G X 5/16" 1 ML MISC Use to administer insulin 100 each 0 Taking  . Prenatal Vit-Fe Fumarate-FA (PRENATAL VITAMINS PLUS) 27-1 MG TABS Take 1 tablet by mouth daily. (Patient not  taking: Reported on 07/23/2014) 30 tablet 5 Not Taking  . valACYclovir (VALTREX) 500 MG tablet Take 1 tablet (500 mg total) by mouth 2 (two) times daily. 6 tablet 0    Review of Systems   All systems reviewed and negative except as stated in HPI  Blood pressure 141/98, pulse 100, temperature 98 F (36.7 C), temperature source Oral, resp. rate 18, height 5' 5"  (1.651 m), weight 76.975 kg (169 lb 11.2 oz), not currently breastfeeding. General appearance: alert, cooperative and no distress Lungs: clear to auscultation bilaterally Heart: regular rate and rhythm Abdomen: soft, non-tender; bowel sounds normal Extremities: Homans  sign is negative, no sign of DVT Presentation: cephalic Fetal monitoringBaseline: 140 bpm, Variability: Good {> 6 bpm), Accelerations: Reactive and Decelerations: Absent Uterine activityNone Dilation: 2.5 Effacement (%): 90 Station: -2, Ballotable Exam by:: cheryl motte, rn   Prenatal labs: ABO, Rh: O/POS/-- (12/07 1147) Antibody: NEG (12/07 1147) Rubella:   RPR: NON REAC (01/18 1555)  HBsAg: NEGATIVE (12/07 1147)  HIV: NONREACTIVE (01/18 1555)  GBS:   Unknown 1 hr Glucola: History of Type 1 Diabetes Genetic screening: Too Late Anatomy US: Normal Female  Clinic  [facility] Prenatal Labs  Dating  second trimester ultrasound Blood type: --/--/O POS (02/05 1350)  Genetic Screen Quad:  Too late   NIPS: Antibody:NEG (02/05 1350)  Anatomic [place]  Normal - girl Rubella:    GTT  type I RPR: NON REACTIVE (02/05 1350)   TDaP vaccine 06/17/14 HBsAg:   Neg  Flu vaccine  HIV: NON REACTIVE (12/08 1120)   GBS  GBS:   Contraception  depo-provera? Pap:  Baby Food  bottle   Circumcision girl   Pediatrician    Support Person      No results found for this or any previous visit (from the past 24 hour(s)).  Patient Active Problem List   Diagnosis Date Noted  . Hypertension in pregnancy, antepartum 07/23/2014  . [redacted] weeks gestation of pregnancy   . Polyhydramnios   . [redacted] weeks gestation of pregnancy   . Type 1 diabetes mellitus affecting pregnancy in third trimester, antepartum   . [redacted] weeks gestation of pregnancy   . 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 Supervision of high risk pregnancy, antepartum 05/06/2014  . Short interval between pregnancies affecting pregnancy, antepartum 05/06/2014  . Previous cesarean section complicating pregnancy, antepartum condition or complication 38/75/6433  . History of preterm delivery, currently pregnant 05/06/2014  . Late prenatal care 05/06/2014  . Type 1 diabetes mellitus complicating pregnancy, antepartum   . Type 1 diabetes  mellitus with neurological manifestations 12/06/2013   Assessment: Ann Robertson is a 24 y.o. [id number] at 80w2dhere for ROM. - Schedule C-section. - NPO - GBS Unknown. PCN. - Follow up Pre-eclampsia labs - Monitor blood sugar. Has insulin pump. - Circumcision not indicated - Plans to bottle feed - Plans for Depo after discharge, but unsure  RLorna Few2/25/2016, 11:51 AM

## 2014-07-25 NOTE — Telephone Encounter (Signed)
Pt has been admitted and will be having her baby today-Ann Robertson Rounds is needing to know what to do about the pt;s pump and the dosing etc. Please call asap [phone] tanya pratt

## 2014-07-25 NOTE — Telephone Encounter (Signed)
i called Dr Kennon Rounds, and we discussed.  I advised d/c V-GO and start with lantus 20 units qd, and novolog 5 units 3 times a day (just before each meal).  Call if questions.

## 2014-07-25 NOTE — Op Note (Signed)
Preoperative Diagnosis:  IUP @ [redacted]w[redacted]d, PPROM, Class F DM, pre-eclampsia with severe features, previous C-section x 2  Postoperative Diagnosis:  Same  Procedure: repeat low transverse cesarean section  Surgeon: Darron Doom, M.D.  Assistant: None  Findings: Viable female infant, APGAR (1 MIN): 8   APGAR (5 MINS): 8   APGAR (10 MINS): 9, vertex presentation, OP position  Estimated blood loss: 123XX123 cc  Complications: None known  Specimens: Placenta to labor and delivery  Reason for procedure: Briefly, the patient is a 24 y.o. [place]:[id number] [redacted]w[redacted]d who presents for PPROM with other above diagnoses.  Found to be pre-eclamptic with severe features based on BP. Actively laboring and brought for repeat C-section.  Procedure: Patient is a to the OR where spinal analgesia was administered. She was then placed in a supine position with left lateral tilt. She received 2 g of Ancef and SCDs were in place. A timeout was performed. She was prepped and draped in the usual sterile fashion. A Foley catheter was placed in the bladder. A knife was then used to make a Pfannenstiel incision through an old scar. This incision was carried out to underlying fascia which was divided in the midline with the electrocautery. The incision was extended laterally, sharply.  The superior edge of the fascia was dissected off underlying rectus with the electrocautery. The rectus was divided in the midline bilaterally with electrocautery.  The peritoneal cavity was entered sharply. A portion of omentum and uterus were attached the anterior abdominal wall and were taken down with the electrocautery. Alexis retractor was placed inside the incision.  A knife was used to make a low transverse incision on the uterus. This incision was carried down to the amniotic cavity was entered. Fetus was in LOP position and was brought up out of the incision without difficulty. Delayed cord  Clamping x 1 minute and then was clamped x 2 and cut. Infant  taken to waiting pediatrician.  Cord blood was obtained. Placenta was delivered from the uterus.  Uterus was cleaned with dry lap pads. Uterine incision closed with 0 Vicryl suture in a locked running fashion. A second layer of 0 Vicryl in an imbricating fashion was used to achieve hemostasis.Pressure and electrocautery were used to aide in hemostasis from the bladder edges and around uterine scar. Alexis retractor was removed from the abdomen. Peritoneal closure was done with 0 Vicryl suture. Rectus re-approximated due to small bleeders on edges. Fascia is closed with 0 Vicryl suture in a running fashion. Subcutaneous tissue infused with 30cc 0.25% Marcaine. Skin closed using 3-0 Vicryl on a Keith needle.  Steri strips applied, followed by pressure dressing.  All instrument, needle and lap counts were correct x 2.  Patient was awake and taken to PACU stable.  Infant to NICU, stable.   PRATT,TANYA SMD 07/25/2014 3:41 PM

## 2014-07-25 NOTE — Anesthesia Preprocedure Evaluation (Signed)
Anesthesia Evaluation  Patient identified by MRN, date of birth, ID band Patient awake    Reviewed: Allergy & Precautions, H&P , NPO status , Patient's Chart, lab work & pertinent test results  Airway Mallampati: II       Dental   Pulmonary former smoker,  breath sounds clear to auscultation        Cardiovascular Exercise Tolerance: Good hypertension, Rhythm:regular Rate:Normal     Neuro/Psych    GI/Hepatic   Endo/Other  diabetes  Renal/GU      Musculoskeletal   Abdominal   Peds  Hematology   Anesthesia Other Findings   Reproductive/Obstetrics (+) Pregnancy                             Anesthesia Physical  Anesthesia Plan  ASA: III and emergent  Anesthesia Plan: Spinal   Post-op Pain Management:    Induction:   Airway Management Planned:   Additional Equipment:   Intra-op Plan:   Post-operative Plan:   Informed Consent: I have reviewed the patients History and Physical, chart, labs and discussed the procedure including the risks, benefits and alternatives for the proposed anesthesia with the patient or authorized representative who has indicated his/her understanding and acceptance.     Plan Discussed with: Anesthesiologist, CRNA and Surgeon  Anesthesia Plan Comments:         Anesthesia Quick Evaluation

## 2014-07-25 NOTE — Anesthesia Procedure Notes (Signed)
Spinal Patient location during procedure: OR Start time: 07/25/2014 2:29 PM Staffing Anesthesiologist: Rudean Curt Performed by: anesthesiologist  Preanesthetic Checklist Completed: patient identified, site marked, surgical consent, pre-op evaluation, timeout performed, IV checked, risks and benefits discussed and monitors and equipment checked Spinal Block Patient position: sitting Prep: DuraPrep Patient monitoring: heart rate, cardiac monitor, continuous pulse ox and blood pressure Approach: midline Location: L3-4 Injection technique: single-shot Needle Needle type: Sprotte  Needle gauge: 24 G Needle length: 9 cm Assessment Sensory level: T4 Additional Notes Patient identified.  Risk benefits discussed including failed block, incomplete pain control, headache, nerve damage, paralysis, blood pressure changes, nausea, vomiting, reactions to medication both toxic or allergic, and postpartum back pain.  Patient expressed understanding and wished to proceed.  All questions were answered.  Sterile technique used throughout procedure.  CSF was clear.  No parasthesia or other complications.  Please see nursing notes for vital signs.

## 2014-07-25 NOTE — MAU Note (Signed)
Pt reprots she thinks she is leaking. Noticed at 1030 wetness in her panties. Denies any ctx or cramping. Has has 2 preterm babies 76, 37 weeks.

## 2014-07-25 NOTE — Anesthesia Preprocedure Evaluation (Signed)
Anesthesia Evaluation  Patient identified by MRN, date of birth, ID band Patient awake    Reviewed: Allergy & Precautions, H&P , NPO status , Patient's Chart, lab work & pertinent test results  Airway Mallampati: II       Dental   Pulmonary former smoker,  breath sounds clear to auscultation        Cardiovascular Exercise Tolerance: Good hypertension, Rhythm:regular Rate:Normal     Neuro/Psych    GI/Hepatic   Endo/Other  diabetes  Renal/GU      Musculoskeletal   Abdominal   Peds  Hematology   Anesthesia Other Findings   Reproductive/Obstetrics (+) Pregnancy                             Anesthesia Physical Anesthesia Plan  ASA: III and emergent  Anesthesia Plan: Spinal   Post-op Pain Management:    Induction:   Airway Management Planned:   Additional Equipment:   Intra-op Plan:   Post-operative Plan:   Informed Consent: I have reviewed the patients History and Physical, chart, labs and discussed the procedure including the risks, benefits and alternatives for the proposed anesthesia with the patient or authorized representative who has indicated his/her understanding and acceptance.     Plan Discussed with: Anesthesiologist, CRNA and Surgeon  Anesthesia Plan Comments:         Anesthesia Quick Evaluation

## 2014-07-26 ENCOUNTER — Encounter (HOSPITAL_COMMUNITY): Payer: Self-pay | Admitting: Family Medicine

## 2014-07-26 ENCOUNTER — Other Ambulatory Visit: Payer: Medicaid Other

## 2014-07-26 LAB — COMPREHENSIVE METABOLIC PANEL
ALBUMIN: 2.3 g/dL — AB (ref 3.5–5.2)
ALT: 9 U/L (ref 0–35)
ANION GAP: 1 — AB (ref 5–15)
AST: 26 U/L (ref 0–37)
Alkaline Phosphatase: 55 U/L (ref 39–117)
BILIRUBIN TOTAL: 0.5 mg/dL (ref 0.3–1.2)
BUN: 8 mg/dL (ref 6–23)
CHLORIDE: 105 mmol/L (ref 96–112)
CO2: 29 mmol/L (ref 19–32)
Calcium: 7.4 mg/dL — ABNORMAL LOW (ref 8.4–10.5)
Creatinine, Ser: 0.76 mg/dL (ref 0.50–1.10)
GFR calc non Af Amer: >90 mL/min (ref >90)
Glucose, Bld: 107 mg/dL — ABNORMAL HIGH (ref 70–99)
Potassium: 4 mmol/L (ref 3.5–5.1)
SODIUM: 135 mmol/L (ref 135–145)
Total Protein: 6 g/dL (ref 6.0–8.3)

## 2014-07-26 LAB — GLUCOSE, CAPILLARY
GLUCOSE-CAPILLARY: 165 mg/dL — AB (ref 70–99)
GLUCOSE-CAPILLARY: 163 mg/dL — AB (ref 70–99)
GLUCOSE-CAPILLARY: 131 mg/dL — AB (ref 70–99)
GLUCOSE-CAPILLARY: 172 mg/dL — AB (ref 70–99)
GLUCOSE-CAPILLARY: 238 mg/dL — AB (ref 70–99)
GLUCOSE-CAPILLARY: 100 mg/dL — AB (ref 70–99)
Glucose-Capillary: 113 mg/dL — ABNORMAL HIGH (ref 70–99)
Glucose-Capillary: 114 mg/dL — ABNORMAL HIGH (ref 70–99)
Glucose-Capillary: 149 mg/dL — ABNORMAL HIGH (ref 70–99)
Glucose-Capillary: 162 mg/dL — ABNORMAL HIGH (ref 70–99)
Glucose-Capillary: 164 mg/dL — ABNORMAL HIGH (ref 70–99)
Glucose-Capillary: 222 mg/dL — ABNORMAL HIGH (ref 70–99)
Glucose-Capillary: 228 mg/dL — ABNORMAL HIGH (ref 70–99)
Glucose-Capillary: 235 mg/dL — ABNORMAL HIGH (ref 70–99)
Glucose-Capillary: 259 mg/dL — ABNORMAL HIGH (ref 70–99)
Glucose-Capillary: 270 mg/dL — ABNORMAL HIGH (ref 70–99)

## 2014-07-26 LAB — CBC
HCT: 32.1 % — ABNORMAL LOW (ref 36.0–46.0)
Hemoglobin: 10.9 g/dL — ABNORMAL LOW (ref 12.0–15.0)
MCH: 30 pg (ref 26.0–34.0)
MCHC: 34 g/dL (ref 30.0–36.0)
MCV: 88.4 fL (ref 78.0–100.0)
PLATELETS: 232 10*3/uL (ref 150–400)
RBC: 3.63 MIL/uL — AB (ref 3.87–5.11)
RDW: 12.6 % (ref 11.5–15.5)
WBC: 13.4 10*3/uL — ABNORMAL HIGH (ref 4.0–10.5)

## 2014-07-26 MED ORDER — INSULIN GLARGINE 100 UNIT/ML ~~LOC~~ SOLN
10.0000 [IU] | Freq: Every day | SUBCUTANEOUS | Status: DC
  Administered 2014-07-26: 10 [IU] via SUBCUTANEOUS
  Filled 2014-07-26 (×2): qty 0.1

## 2014-07-26 MED ORDER — INSULIN ASPART 100 UNIT/ML ~~LOC~~ SOLN
0.0000 [IU] | Freq: Three times a day (TID) | SUBCUTANEOUS | Status: DC
  Administered 2014-07-26 (×2): 5 [IU] via SUBCUTANEOUS

## 2014-07-26 MED ORDER — INSULIN ASPART 100 UNIT/ML ~~LOC~~ SOLN: 0.0000 [IU] | Freq: Three times a day (TID) | SUBCUTANEOUS | Status: DC

## 2014-07-26 MED ORDER — LACTATED RINGERS IV SOLN
INTRAVENOUS | Status: DC
  Administered 2014-07-26: via INTRAVENOUS

## 2014-07-26 MED ORDER — INSULIN ASPART 100 UNIT/ML ~~LOC~~ SOLN: 0.0000 [IU] | Freq: Every day | SUBCUTANEOUS | Status: DC

## 2014-07-26 MED ORDER — INSULIN ASPART 100 UNIT/ML ~~LOC~~ SOLN
5.0000 [IU] | Freq: Three times a day (TID) | SUBCUTANEOUS | Status: DC
  Administered 2014-07-26: 5 [IU] via SUBCUTANEOUS

## 2014-07-26 NOTE — Progress Notes (Signed)
UR chart review completed.  

## 2014-07-26 NOTE — Progress Notes (Signed)
Subjective: Postpartum Day 1: Cesarean Delivery Patient reports tolerating PO.  She reports adequate pain control.  She was able to go to the nursery last night.  She reports that she is able to connect her insulin pump but, it is at home and she will have someone to bring it in. Glucose well controlled overnight.  Pt currently on glucose stabilizer. Pt denies HA or visual changes. Objective: Vital signs in last 24 hours: Temp:  [97.6 F (36.4 C)-97.8 F (36.6 C)] 97.6 F (36.4 C) (02/26 1120) Pulse Rate:  [81-109] 104 (02/26 0900) Resp:  [10-20] 20 (02/26 1125) BP: (98-160)/(57-120) 136/94 mmHg (02/26 0727) SpO2:  [93 %-100 %] 100 % (02/26 0708) Weight:  [150 lb 9.6 oz (68.312 kg)] 150 lb 9.6 oz (68.312 kg) (02/26 0501)  Physical Exam:  General: alert and no distress Lochia: appropriate Uterine Fundus: firm Incision: dressing dry DVT Evaluation: No evidence of DVT seen on physical exam.   Recent Labs  07/25/14 1315 07/26/14 0517  HGB 12.6 10.9*  HCT 37.0 32.1*   I/O last 3 completed shifts: In: 4979.8 [P.O.:720; I.V.:4259.8] Out: [id number] [Urine:5450; Blood:700] Total I/O In: 474.7 [I.V.:474.7] Out: 300 [Urine:300]   Assessment/Plan: Status post Cesarean section. Doing well postoperatively.  Discontinue glucose stabilizer.   Per endocrinology will begin Lantus 10units daily and Novalog 5 units with meals. Pt to have her insulin pump brought and will have the nurse call the physician at the time so we can coordinate her pump starting with the current insulin being stopped.  Stop Magnesium sulfate after 24 hours  HARRAWAY-SMITH, Colter Magowan 07/26/2014, 11:51 AM

## 2014-07-26 NOTE — Progress Notes (Signed)
Inpatient Diabetes Program Recommendations  AACE/ADA: New Consensus Statement on Inpatient Glycemic Control (2013)  Target Ranges:  Prepandial:   less than 140 mg/dL      Peak postprandial:   less than 180 mg/dL (1-2 hours)      Critically ill patients:  140 - 180 mg/dL   Results for Ann Robertson, Ann Robertson (MRN [id number]) as of 07/26/2014 08:21  Ref. Range 07/25/2014 20:39 07/25/2014 21:35 07/25/2014 22:51 07/25/2014 23:55 07/26/2014 00:56 07/26/2014 01:59 07/26/2014 03:02 07/26/2014 04:03 07/26/2014 04:59 07/26/2014 06:01 07/26/2014 07:01  Glucose-Capillary Latest Range: 70-99 mg/dL 90 85 180 (H) 164 (H) 235 (H) 228 (H) 165 (H) 163 (H) 113 (H) 114 (H) 131 (H)   Current orders for Inpatient glycemic control: Novolin R insulin drip pr GlucoStabilizer  Inpatient Diabetes Program Recommendations Insulin - Basal: At time of transition off IV insulin drip, please consider ordering Lantus 10 units daily at 8am (starting now). Correction (SSI): At time of transition off IV insulin drip, please consider ordering CBGs with Novolog correction ACHS (using regular Glycemic Control order set). Insulin - Meal Coverage: At time of transition off IV insulin drip, please consider ordering Novolog 4 units TID with meals for meal coverage.  Thanks, Barnie Alderman, RN, MSN, CCRN, CDE Diabetes Coordinator Inpatient Diabetes Program [phone] (Team Pager) [phone] ([facility] office) [phone] [facility] office)

## 2014-07-26 NOTE — Lactation Note (Signed)
This note was copied from the chart of Ann Robertson. Lactation Consultation Note  Patient Name: Ann Robertson [id number] Date: 07/26/2014 Reason for consult: Initial assessment   With this mom of a NICU baby, 35 3/7 weeks CGA, and 28 hours old. Mom is in [place], and was shown how to pump in premie setting, with DEP, and to follow with hand expression. Tiny drop of colostrum expressed from left breast only. Mom is on magnesium and insulin drip. i reviewed lactation services with mom and the NICU booklet on providing EBm for your baby. Mom is eager to breast feed this baby. i told her that her milk will no transition in for 2-3 days, but to keep pumping despite this, to stimulat her production. Mom knows to call for questions/concerns.    Maternal Data Formula Feeding for Exclusion: Yes (mom in [place] and baby in NICU) Reason for exclusion: Admission to Intensive Care Unit (ICU) post-partum  Feeding    LATCH Score/Interventions                      Lactation Tools Discussed/Used WIC Program: Yes (fax sent to [facility] for DEP) Pump Review: Setup, frequency, and cleaning;Milk Storage;Other (comment) (hand expression, premie setting) Initiated by:: clee RN LC at 20 hours po9st partum Date initiated:: 07/26/14   Consult Status Consult Status: Follow-up Date: 07/27/14 Follow-up type: In-patient    Tonna Corner 07/26/2014, 1:21 PM

## 2014-07-27 LAB — GLUCOSE, CAPILLARY
GLUCOSE-CAPILLARY: 120 mg/dL — AB (ref 70–99)
GLUCOSE-CAPILLARY: 61 mg/dL — AB (ref 70–99)
GLUCOSE-CAPILLARY: 51 mg/dL — AB (ref 70–99)
GLUCOSE-CAPILLARY: 76 mg/dL (ref 70–99)
Glucose-Capillary: 249 mg/dL — ABNORMAL HIGH (ref 70–99)
Glucose-Capillary: 92 mg/dL (ref 70–99)
Glucose-Capillary: 114 mg/dL — ABNORMAL HIGH (ref 70–99)
Glucose-Capillary: 60 mg/dL — ABNORMAL LOW (ref 70–99)

## 2014-07-27 MED FILL — Insulin Aspart Inj 100 Unit/ML: SUBCUTANEOUS | Qty: 10 | Status: AC

## 2014-07-27 NOTE — Progress Notes (Signed)
Hypoglycemic Event  CBG: 60  Treatment : 235 ml spite.21 g peanut butter,14 g graham crackers Symptoms : NONE  Follow-up CBG: Time: 2200 CBG Result: 51, ate cheese sandwich , 235 ml sprite     Patient was in NICU and was asked to come down for CBG check     CBG =  76 @ 2315   Possible Reasons for Event : In adequate meal intake  Comments/MD notified:  Will continue to monitor.     Sedalia Muta Arell  Remember to initiate Hypoglycemia Order Set & complete

## 2014-07-27 NOTE — Lactation Note (Signed)
This note was copied from the chart of Ann Lanyah Partington. Lactation Consultation Note  Mom has been trying to latch baby but baby isn't latching.  I reviewed late preterm behavior with her and gave encouragement.  Mom is also discouraged because she is not seeing any milk yet.  I explained to her the timetable for milk production.  We reviewed hand expression but did not see any colostrum.  I encouraged her to continue pumping per protocol and that production would increase.  She has increased vascularity to the area and feels like her breasts are changing.  Follow-up planned. Patient Name: Ann Robertson [id number] Date: 07/27/2014     Maternal Data    Feeding Feeding Type: Formula Nipple Type: Slow - flow Length of feed: 10 min  LATCH Score/Interventions                      Lactation Tools Discussed/Used     Consult Status      Van Clines 07/27/2014, 5:26 PM

## 2014-07-27 NOTE — Progress Notes (Signed)
Subjective: Postpartum Day 2: Cesarean Delivery Patient reports incisional pain, tolerating PO, + flatus and no problems voiding.  She reports adequate pain control. Glucose well controlled overnight. Pt denies HA or visual changes.  Objective: Vital signs in last 24 hours: Temp:  [97.4 F (36.3 C)-98.3 F (36.8 C)] 98.3 F (36.8 C) (02/27 0626) Pulse Rate:  [83-104] 83 (02/27 0626) Resp:  [18-20] 18 (02/27 0626) BP: (121-133)/(75-93) 132/75 mmHg (02/27 0626) SpO2:  [98 %-100 %] 98 % (02/27 0626)  Physical Exam:  General: alert and no distress Lochia: appropriate Uterine Fundus: firm Incision: dressing dry DVT Evaluation: No evidence of DVT seen on physical exam.   Recent Labs  07/25/14 1315 07/26/14 0517  HGB 12.6 10.9*  HCT 37.0 32.1*   I/O last 3 completed shifts: In: 4964.6 [P.O.:1920; I.V.:3044.6] Out: [id number] [Urine:6875]    Assessment/Plan: Status post Cesarean section. Doing well postoperatively.  Restart insulin pump today, stop other insulin orders. Continue glucose checks qachs. Discharge tomorrow given baby in NICU.  Beverlyn Roux 07/27/2014, 7:41 AM  Pt seen; chart reviewed.  Glc yesterday all >230's.  Pt placed pump on this am.  The pump allow pt to SELF ADMINISTER her Novalog insulin.  She gives herself coverage for meals and has a schedule for coverage based on her FSG results.  Dr. Kennon Rounds spoke with pts endocrinologist and confirmed this.    I have explained to pt that her glc levels now are too hight for her post op state.  She is worried about falling too low.  After a discussion pts nurse will contact me with her next glc level and what the pt is eating  and we will decide if she needs added coverage.  I suspect that pt will need more instruction on carb counting and a carb modified diet at some point.      No other post op concerns.  She is interested in an abd binder.  Jennae Hakeem L. Harraway-Smith, M.D., Cherlynn June

## 2014-07-27 NOTE — Progress Notes (Signed)
BS-107 @ 1300. Called MD and LM with OR for Dr. Ihor Dow with BS results. No new orders given. Will continue to monitor.

## 2014-07-27 NOTE — Progress Notes (Signed)
Pt applied independently her V-go Insulin kit/syringe  to her right anterior upper arm. AC BS 249 per pt she will give herself Novolog 2 units and additional 2 units to cover her meal. Will continue to monitor.

## 2014-07-27 NOTE — Progress Notes (Signed)
Called to pt room c/o weakness and shaky. States my BS is low. BS 61. Pt requesting graham crackers/peanut butter (1 pkg) and a regular sprite 7.5 oz). Will continue to monitor.

## 2014-07-27 NOTE — Anesthesia Postprocedure Evaluation (Signed)
Anesthesia Post Note  Patient: Ann Robertson  Procedure(s) Performed: * No procedures listed *  Anesthesia type: Spinal  Patient location: Mother/Baby  Post pain: Pain level controlled  Post assessment: Post-op Vital signs reviewed  Last Vitals:  Filed Vitals:   07/27/14 0626  BP: 132/75  Pulse: 83  Temp: 36.8 C  Resp: 18    Post vital signs: Reviewed  Level of consciousness: awake  Complications: No apparent anesthesia complications

## 2014-07-27 NOTE — Progress Notes (Signed)
BS 92. Pt reports feeling better. Encouraged pt to order dinner. Will continue to monitor.

## 2014-07-27 NOTE — Progress Notes (Signed)
Called by NICU to pick up PT in NICU c/o weakness and hot flashes. Transported back to room via [place], oriented x4, pale and states she feels very weak able to stand and pivot back to bed. Rates pain 2 out 10. VS checked see flow sheet. BS 120. Pt states she will order her lunch. Will continue to monitor.

## 2014-07-28 LAB — GLUCOSE, CAPILLARY
GLUCOSE-CAPILLARY: 69 mg/dL — AB (ref 70–99)
GLUCOSE-CAPILLARY: 66 mg/dL — AB (ref 70–99)
Glucose-Capillary: 60 mg/dL — ABNORMAL LOW (ref 70–99)
Glucose-Capillary: 76 mg/dL (ref 70–99)
Glucose-Capillary: 93 mg/dL (ref 70–99)

## 2014-07-28 MED ORDER — OXYCODONE-ACETAMINOPHEN 5-325 MG PO TABS: 1.0000 | ORAL_TABLET | ORAL | Status: DC | PRN

## 2014-07-28 MED ORDER — IBUPROFEN 600 MG PO TABS: 600.0000 mg | ORAL_TABLET | Freq: Four times a day (QID) | ORAL | Status: DC

## 2014-07-28 NOTE — Progress Notes (Signed)

## 2014-07-28 NOTE — Discharge Summary (Addendum)
Obstetric Discharge Summary Reason for Admission: onset of labor Prenatal Procedures: NST Intrapartum Procedures: cesarean: low cervical, transverse Postpartum Procedures: none Complications-Operative and Postpartum: none HEMOGLOBIN  Date Value Ref Range Status  07/26/2014 10.9* 12.0 - 15.0 g/dL Final  12/21/2012 13.9 g/dL Final   HCT  Date Value Ref Range Status  07/26/2014 32.1* 36.0 - 46.0 % Final  12/21/2012 42 % Final   Pt with no complaints today.  She is tolerating a regular diet and passed flatus and had a BM.  She denies N/V.  She connected her insulin pump and has been managing it with her prev insulin regimen. Her glc levels have all been between 69-120's.  She adequate pain control.  She is interested in using Depo Provera for contraception.  She is voiding without difficulty.  Physical Exam:  General: alert and no distress Lochia: appropriate Uterine Fundus: firm Incision: dressing clean and dry  DVT Evaluation: No evidence of DVT seen on physical exam.  Discharge Diagnoses: Term Pregnancy-delivered and post op cesarean section  Discharge Information: Date: 07/28/2014 Activity: pelvic rest Diet: Carb modified diet Medications: Ibuprofen, Percocet and Insulin pump Condition: stable Instructions: refer to practice specific booklet Discharge to: home Follow-up Information    Follow up with [facility] In 2 weeks.   Specialty:  Obstetrics and Gynecology   Contact information:   [id number] [place] [phone]    Pt to make f/u appt with endocrinologist.  She was also asked to call him to notify of current glc reading upon discharge   Newborn Data: Live born female  Birth Weight: 7 lb 3.7 oz (3280 g) APGAR: 8, 8  Infant in [place] 07/28/2014, 8:01 AM

## 2014-07-28 NOTE — Progress Notes (Signed)
Hypoglycemic Event  CBG: 60  Treatment: 2 graham crackers with peanut butter + sprite 200cc  Symptoms: None  Follow-up CBG: Time: 0520 CBG Result: 76  Possible Reasons for Event: unknown  Comments/MD notified: will continue to monitor    Ann Robertson  Remember to initiate Hypoglycemia Order Set & complete

## 2014-07-28 NOTE — Discharge Instructions (Signed)

## 2014-07-28 NOTE — Progress Notes (Signed)
Clinical Social Work Department PSYCHOSOCIAL ASSESSMENT - MATERNAL/CHILD 07/28/2014  Patient:  Ann Robertson, Ann Robertson  Account Number:  [id number]  [place] Date:  07/25/2014  Ardine Eng Name:   Sherral Hammers    Clinical Social Worker:  Kelsei Defino, LCSW   Date/Time:  07/28/2014 08:45 AM  Date Referred:  07/27/2014   Referral source  NICU     Referred reason  NICU   Other referral source:    I:  FAMILY / [place] legal guardian:  PARENT  Guardian - Name Guardian - Age [place] [address]  [place], [place] [address]  [place]     Other household support members/support persons Other support:    II  PSYCHOSOCIAL DATA Information Source:    Occupational hygienist Employment:   FOB is employed   Museum/gallery curator resources:  [employer] [place]:   Other  [place] / Grade:   Maternity Care Coordinator / Child Services Coordination / Early Interventions:  Cultural issues impacting care:    III  STRENGTHS Strengths  Supportive family/friends  Home prepared for Child (including basic supplies)  Adequate Resources  Understanding of illness   Strength comment:    IV  RISK FACTORS AND CURRENT PROBLEMS Current Problem:       V  SOCIAL WORK ASSESSMENT Met with mother who was pleasant and receptive to social work intervention.  She is a single parent with 2 other dependents ages 66 and 33.  She reportedly lives alone with her children.  FOB is reportedly employed and very supportive.   Mother reports hx of depression and anxiety. Informed that she has not taken any psychiatric medication for the past 2-3 years.  Informed that she stop taking the medication because it was no longer effective.   Mother states that she started [facility] late because she did not become aware of the pregnancy until she was almost 6 months pregnant.  Mother communicate hopes that newborn will be  discharged today.  Informed that she is diabetic and newborn was admitted to NICU for Hypoglycemia.  Informed that newborn is doing well and she hopes she will be discharged from NICU today.  She denies any hx of substance abuse.  Mother reports no transportation issues.  No acute social concerns related at this time.  CSW will follow PRN.      VI SOCIAL WORK PLAN Social Work Plan  No Further Intervention Required / No Barriers to Discharge   Type of pt/family education:   PP Depression information and resources   If child protective services report - county:   If child protective services report - date:   Information/referral to community resources comment:   Other social work plan:

## 2014-07-29 ENCOUNTER — Other Ambulatory Visit: Payer: Medicaid Other

## 2014-07-29 LAB — GLUCOSE, CAPILLARY: Glucose-Capillary: 107 mg/dL — ABNORMAL HIGH (ref 70–99)

## 2014-07-30 ENCOUNTER — Encounter (HOSPITAL_COMMUNITY): Payer: Self-pay | Admitting: General Practice

## 2014-07-30 ENCOUNTER — Inpatient Hospital Stay (HOSPITAL_COMMUNITY)
Admission: AD | Admit: 2014-07-30 | Discharge: 2014-07-30 | Disposition: A | Discharge status: Home / Self Care | Payer: Medicaid Other | Source: Ambulatory Visit | Attending: Family Medicine | Admitting: Family Medicine

## 2014-07-30 DIAGNOSIS — O9989 Other specified diseases and conditions complicating pregnancy, childbirth and the puerperium: Secondary | ICD-10-CM

## 2014-07-30 DIAGNOSIS — L299 Pruritus, unspecified: Secondary | ICD-10-CM | POA: Insufficient documentation

## 2014-07-30 DIAGNOSIS — Z87891 Personal history of nicotine dependence: Secondary | ICD-10-CM | POA: Insufficient documentation

## 2014-07-30 DIAGNOSIS — Z3A35 35 weeks gestation of pregnancy: Secondary | ICD-10-CM

## 2014-07-30 DIAGNOSIS — O9089 Other complications of the puerperium, not elsewhere classified: Secondary | ICD-10-CM | POA: Diagnosis not present

## 2014-07-30 MED ORDER — HYDROXYZINE PAMOATE 25 MG PO CAPS: 25.0000 mg | ORAL_CAPSULE | Freq: Three times a day (TID) | ORAL | Status: DC | PRN

## 2014-07-30 MED ORDER — HYDROXYZINE HCL 25 MG PO TABS
25.0000 mg | ORAL_TABLET | Freq: Once | ORAL | Status: AC
  Administered 2014-07-30: 25 mg via ORAL
  Filled 2014-07-30: qty 1

## 2014-07-30 NOTE — MAU Note (Signed)
Pt presents to MAU with c/o incisional itching and wound check to make sure her incision does not become infected. She states extreme itching and wants medicine to help stop the itching.

## 2014-07-30 NOTE — MAU Note (Signed)
Pt had C/S on 2/25, baby in NICU.  Pt C/O extreme itching @C /S incision.  Pt states she is afraid incision will become infected because she is very tempted to scratch.  Abdominal dressing in place.

## 2014-07-30 NOTE — MAU Provider Note (Signed)
History     CSN: [id number]  Arrival date and time: 07/30/14 0810   None     Chief Complaint  Patient presents with  . Wound Check  . Pruritis   HPI  45 [id number].[id number] 4 days postpartum  Pt presents to MAU with c/o incisional itching and wound check to make sure her incision does not become infected. She states extreme itching and wants medicine to help stop the itching.             Past Medical History  Diagnosis Date  . Diabetes mellitus   . Juvenile diabetes mellitus   . Juvenile diabetes 2000  . Neuropathy   . High cholesterol   . HSV-2 infection   . STD (sexually transmitted disease)     Past Surgical History  Procedure Laterality Date  . Cesarean section    . Wisdom tooth extraction    . Cesarean section N/A 07/05/2013    Procedure: CESAREAN SECTION;  Surgeon: Emily Filbert, MD;  Location: [facility];  Service: Obstetrics;  Laterality: N/A;  . Cesarean section N/A 07/25/2014    Procedure: CESAREAN SECTION;  Surgeon: Donnamae Jude, MD;  Location: [facility];  Service: Obstetrics;  Laterality: N/A;    Family History  Problem Relation Age of Onset  . Stroke Mother   . Hypertension Mother   . Heart disease Mother   . Kidney disease Mother   . Hyperlipidemia Father   . Diabetes Father     History  Substance Use Topics  . Smoking status: Former Research scientist (life sciences)  . Smokeless tobacco: Never Used  . Alcohol Use: No    Allergies:  Allergies  Allergen Reactions  . Hydromorphone Hcl Itching and Rash    Prescriptions prior to admission  Medication Sig Dispense Refill Last Dose  . ibuprofen (ADVIL,MOTRIN) 600 MG tablet Take 1 tablet (600 mg total) by mouth every 6 (six) hours. 30 tablet 0   . insulin aspart (NOVOLOG) 100 UNIT/ML injection For use in pump, total of 50 units per day 2 vial PRN in pump  . Insulin Syringe-Needle U-100 (ADVOCATE INSULIN SYRINGE) 31G X 5/16" 1 ML MISC Use to administer insulin 100 each 0 Taking  . oxyCODONE-acetaminophen (PERCOCET/ROXICET) 5-325  MG per tablet Take 1 tablet by mouth every 4 (four) hours as needed (for pain scale less than 7). 30 tablet 0   . Prenatal Vit-Fe Fumarate-FA (PRENATAL VITAMINS PLUS) 27-1 MG TABS Take 1 tablet by mouth daily. (Patient not taking: Reported on 07/23/2014) 30 tablet 5 Not Taking  . valACYclovir (VALTREX) 500 MG tablet Take 1 tablet (500 mg total) by mouth 2 (two) times daily. (Patient not taking: Reported on 07/25/2014) 6 tablet 0     Review of Systems  Constitutional: Negative for fever.  Eyes: Negative for blurred vision and double vision.  Respiratory: Negative for cough and shortness of breath.   Cardiovascular: Negative for chest pain and leg swelling.  Gastrointestinal: Negative for nausea, vomiting and abdominal pain.  Genitourinary: Negative for dysuria, urgency and frequency.  Skin: Positive for itching. Negative for rash.  Neurological: Negative.  Negative for headaches.  Endo/Heme/Allergies: Negative.   Psychiatric/Behavioral: Negative.    Physical Exam   Blood pressure 142/95, pulse 102, temperature 98.7 F (37.1 C), temperature source Oral, resp. rate 20, unknown if currently breastfeeding.  Physical Exam  Nursing note and vitals reviewed. Constitutional: She is oriented to person, place, and time. She appears well-developed and well-nourished.  HENT:  Head: Normocephalic and atraumatic.  Neck:  Normal range of motion.  Cardiovascular: Normal rate.   Respiratory: Effort normal. No respiratory distress.  GI: Soft. She exhibits no distension and no mass. There is tenderness. There is no rebound and no guarding.  Incision slightly tender; Honeycomb dressing intact. No redness. Edema drainage noted  Musculoskeletal: Normal range of motion.  Neurological: She is oriented to person, place, and time.  Skin: Skin is warm and dry.  Psychiatric: She has a normal mood and affect. Her behavior is normal. Judgment and thought content normal.    MAU Course  Procedures  Vistaril 25  mg po   Assessment and Plan  Pruritis  Vistaril 25mg  PO RX Benadryl OTC prn RTC for scheduled incision check  Willona Phariss Grissett 07/30/2014, 8:57 AM

## 2014-07-30 NOTE — Discharge Instructions (Signed)
Incision Care °An incision is when a surgeon cuts into your body tissues. After surgery, the incision needs to be cared for properly to prevent infection.  °HOME CARE INSTRUCTIONS  °· Take all medicine as directed by your caregiver. Only take over-the-counter or prescription medicines for pain, discomfort, or fever as directed by your caregiver. °· Do not remove your bandage (dressing) or get your incision wet until your surgeon gives you permission. In the event that your dressing becomes wet, dirty, or starts to smell, change the dressing and call your surgeon for instructions as soon as possible. °· Take showers. Do not take tub baths, swim, or do anything that may soak the wound until it is healed. °· Resume your normal diet and activities as directed or allowed. °· Avoid lifting any weight until you are instructed otherwise. °· Use anti-itch antihistamine medicine as directed by your caregiver. The wound may itch when it is healing. Do not pick or scratch at the wound. °· Follow up with your caregiver for stitch (suture) or staple removal as directed. °· Drink enough fluids to keep your urine clear or pale yellow. °SEEK MEDICAL CARE IF:  °· You have redness, swelling, or increasing pain in the wound that is not controlled with medicine. °· You have drainage, blood, or pus coming from the wound that lasts longer than 1 day. °· You develop muscle aches, chills, or a general ill feeling. °· You notice a bad smell coming from the wound or dressing. °· Your wound edges separate after the sutures, staples, or skin adhesive strips have been removed. °· You develop persistent nausea or vomiting. °SEEK IMMEDIATE MEDICAL CARE IF:  °· You have a fever. °· You develop a rash. °· You develop dizzy episodes or faint while standing. °· You have difficulty breathing. °· You develop any reaction or side effects to medicine given. °MAKE SURE YOU:  °· Understand these instructions. °· Will watch your condition. °· Will get help  right away if you are not doing well or get worse. °Document Released: 12/04/2004 Document Revised: 08/09/2011 Document Reviewed: 07/11/2013 °ExitCare® Patient Information ©2015 [employer]. This information is not intended to replace advice given to you by your health care provider. Make sure you discuss any questions you have with your health care provider. ° ° °

## 2014-08-02 ENCOUNTER — Encounter: Payer: Self-pay | Admitting: General Practice

## 2014-08-05 ENCOUNTER — Other Ambulatory Visit: Payer: Medicaid Other

## 2014-08-05 ENCOUNTER — Ambulatory Visit (HOSPITAL_COMMUNITY): Payer: Medicaid Other

## 2014-08-13 ENCOUNTER — Ambulatory Visit: Payer: Medicaid Other | Admitting: Endocrinology

## 2014-09-05 ENCOUNTER — Ambulatory Visit: Payer: Medicaid Other | Admitting: Physician Assistant

## 2014-09-05 ENCOUNTER — Ambulatory Visit: Payer: Medicaid Other | Admitting: Endocrinology

## 2014-09-06 ENCOUNTER — Encounter: Payer: Self-pay | Admitting: Obstetrics & Gynecology

## 2014-09-06 ENCOUNTER — Ambulatory Visit (INDEPENDENT_AMBULATORY_CARE_PROVIDER_SITE_OTHER): Payer: Medicaid Other | Admitting: Medical

## 2014-09-06 VITALS — BP 119/89 | HR 104 | Temp 99.1°F | Ht 65.0 in | Wt 146.3 lb

## 2014-09-06 DIAGNOSIS — O3421 Maternal care for scar from previous cesarean delivery: Secondary | ICD-10-CM | POA: Diagnosis not present

## 2014-09-06 DIAGNOSIS — E108 Type 1 diabetes mellitus with unspecified complications: Secondary | ICD-10-CM

## 2014-09-06 DIAGNOSIS — Z8751 Personal history of pre-term labor: Secondary | ICD-10-CM

## 2014-09-06 DIAGNOSIS — G8918 Other acute postprocedural pain: Secondary | ICD-10-CM

## 2014-09-06 DIAGNOSIS — Z8759 Personal history of other complications of pregnancy, childbirth and the puerperium: Secondary | ICD-10-CM

## 2014-09-06 DIAGNOSIS — O34219 Maternal care for unspecified type scar from previous cesarean delivery: Secondary | ICD-10-CM | POA: Insufficient documentation

## 2014-09-06 MED ORDER — BLOOD GLUC METER DISP-STRIPS DEVI: 1.0000 | Freq: Four times a day (QID) | Status: DC

## 2014-09-06 MED ORDER — OXYCODONE-ACETAMINOPHEN 5-325 MG PO TABS: 1.0000 | ORAL_TABLET | Freq: Four times a day (QID) | ORAL | Status: DC | PRN

## 2014-09-06 NOTE — Progress Notes (Signed)
Here for postpartum visit. C/o incision still hurting- wants percocet prescription. States got birth control pills prescribed by her pcp, but hasn't started  Yet .

## 2014-09-06 NOTE — Progress Notes (Signed)
Patient ID: Ann Robertson, female   DOB: November 26, 1990, 24 y.o.   MRN: [id number] Subjective:     Ann Robertson is a 24 y.o. female who presents for a postpartum visit. She is 6 weeks postpartum following a low cervical transverse Cesarean section. I have fully reviewed the prenatal and intrapartum course. The delivery was at 35.2 gestational weeks. Outcome: repeat cesarean section, low transverse incision. Anesthesia: spinal. Postpartum course has been normal, only complicated by incisional pain. Baby's course has been normal aside from thrush, which is resolving. Patient states that pediatrician told her to get Rx for antibiotics for breast infection because baby had thrush. Baby is feeding by bottle - Similac stage 1. Bleeding staining only. Bowel function is normal. Bladder function is normal. Patient is not sexually active. Contraception method is OCP (estrogen/progesterone). Postpartum depression screening: negative.  The following portions of the patient's history were reviewed and updated as appropriate: allergies, current medications, past family history, past medical history, past social history, past surgical history and problem list.  Review of Systems Pertinent items are noted in HPI.   Objective:    BP 119/89 mmHg  Pulse 104  Temp(Src) 99.1 F (37.3 C)  Ht 5\' 5"  (1.651 m)  Wt 146 lb 4.8 oz (66.361 kg)  BMI 24.35 kg/m2  Breastfeeding? No  General:  alert and cooperative   Breasts:  inspection negative, no nipple discharge or bleeding, no masses or nodularity palpable  Lungs: normal effort  Heart:  regular rate and rhythm, S1, S2 normal, no murmur, click, rub or gallop  Abdomen: soft, moderate tenderness to palpation peri-incisionally, incision is healing well without edema or surrounding erythema   Vulva:  not evaluated  Vagina: not evaluated  Cervix:  not evaluated  Corpus: not examined  Adnexa:  not examined  Rectal Exam: Not performed.        Assessment:     Normal  postpartum exam. Pap smear not done at today's visit.  Last pap smear was 04/2014 and negative  Plan:    1. Contraception: OCP (estrogen/progesterone) patient given Rx by PCP, plans to pick up today 2. Rx for Percocet given for incision pain. Patient advised that from now on she will not receive Rx for pain from [facility] and will need to follow-up with PCP 3. Patient encouraged to follow-up with endocrinology as scheduled for management of DM type 1. Refill for glucometer strips sent to patient's pharmacy at patient's request.  4. Patient given instructions for Nystatin OTC to be used for breast itching. 5. Follow up in: 1 year for annual exam or as needed.

## 2014-09-06 NOTE — Patient Instructions (Signed)
Nystatin cream is available over-the-counter and can be used on the breast twice daily as needed for itching  Oral Contraception Information Oral contraceptive pills (OCPs) are medicines taken to prevent pregnancy. OCPs work by preventing the ovaries from releasing eggs. The hormones in OCPs also cause the cervical mucus to thicken, preventing the sperm from entering the uterus. The hormones also cause the uterine lining to become thin, not allowing a fertilized egg to attach to the inside of the uterus. OCPs are highly effective when taken exactly as prescribed. However, OCPs do not prevent sexually transmitted diseases (STDs). Safe sex practices, such as using condoms along with the pill, can help prevent STDs.  Before taking the pill, you may have a physical exam and Pap test. Your health care provider may order blood tests. The health care provider will make sure you are a good candidate for oral contraception. Discuss with your health care provider the possible side effects of the OCP you may be prescribed. When starting an OCP, it can take 2 to 3 months for the body to adjust to the changes in hormone levels in your body.  TYPES OF ORAL CONTRACEPTION  The combination pill--This pill contains estrogen and progestin (synthetic progesterone) hormones. The combination pill comes in 21-day, 28-day, or 91-day packs. Some types of combination pills are meant to be taken continuously (365-day pills). With 21-day packs, you do not take pills for 7 days after the last pill. With 28-day packs, the pill is taken every day. The last 7 pills are without hormones. Certain types of pills have more than 21 hormone-containing pills. With 91-day packs, the first 84 pills contain both hormones, and the last 7 pills contain no hormones or contain estrogen only.  The minipill--This pill contains the progesterone hormone only. The pill is taken every day continuously. It is very important to take the pill at the same time  each day. The minipill comes in packs of 28 pills. All 28 pills contain the hormone.  ADVANTAGES OF ORAL CONTRACEPTIVE PILLS  Decreases premenstrual symptoms.   Treats menstrual period cramps.   Regulates the menstrual cycle.   Decreases a heavy menstrual flow.   May treatacne, depending on the type of pill.   Treats abnormal uterine bleeding.   Treats polycystic ovarian syndrome.   Treats endometriosis.   Can be used as emergency contraception.  THINGS THAT CAN MAKE ORAL CONTRACEPTIVE PILLS LESS EFFECTIVE OCPs can be less effective if:   You forget to take the pill at the same time every day.   You have a stomach or intestinal disease that lessens the absorption of the pill.   You take OCPs with other medicines that make OCPs less effective, such as antibiotics, certain HIV medicines, and some seizure medicines.   You take expired OCPs.   You forget to restart the pill on day 7, when using the packs of 21 pills.  RISKS ASSOCIATED WITH ORAL CONTRACEPTIVE PILLS  Oral contraceptive pills can sometimes cause side effects, such as:  Headache.  Nausea.  Breast tenderness.  Irregular bleeding or spotting. Combination pills are also associated with a small increased risk of:  Blood clots.  Heart attack.  Stroke. Document Released: 08/07/2002 Document Revised: 03/07/2013 Document Reviewed: 11/05/2012 [facility] Patient Information 2015 [place], [place]. This information is not intended to replace advice given to you by your health care provider. Make sure you discuss any questions you have with your health care provider. Cesarean Delivery, Care After Refer to this sheet in  the next few weeks. These instructions provide you with information on caring for yourself after your procedure. Your health care provider may also give you specific instructions. Your treatment has been planned according to current medical practices, but problems sometimes occur. Call your  health care provider if you have any problems or questions after you go home. HOME CARE INSTRUCTIONS  Only take over-the-counter or prescription medications as directed by your health care provider.  Do not drink alcohol, especially if you are breastfeeding or taking medication to relieve pain.  Do not chew or smoke tobacco.  Continue to use good perineal care. Good perineal care includes:  Wiping your perineum from front to back.  Keeping your perineum clean.  Check your surgical cut (incision) daily for increased redness, drainage, swelling, or separation of skin.  Clean your incision gently with soap and water every day, and then pat it dry. If your health care provider says it is okay, leave the incision uncovered. Use a bandage (dressing) if the incision is draining fluid or appears irritated. If the adhesive strips across the incision do not fall off within 7 days, carefully peel them off.  Hug a pillow when coughing or sneezing until your incision is healed. This helps to relieve pain.  Do not use tampons or douche until your health care provider says it is okay.  Shower, wash your hair, and take tub baths as directed by your health care provider.  Wear a well-fitting bra that provides breast support.  Limit wearing support panties or control-top hose.  Drink enough fluids to keep your urine clear or pale yellow.  Eat high-fiber foods such as whole grain cereals and breads, brown rice, beans, and fresh fruits and vegetables every day. These foods may help prevent or relieve constipation.  Resume activities such as climbing stairs, driving, lifting, exercising, or traveling as directed by your health care provider.  Talk to your health care provider about resuming sexual activities. This is dependent upon your risk of infection, your rate of healing, and your comfort and desire to resume sexual activity.  Try to have someone help you with your household activities and your  newborn for at least a few days after you leave the hospital.  Rest as much as possible. Try to rest or take a nap when your newborn is sleeping.  Increase your activities gradually.  Keep all of your scheduled postpartum appointments. It is very important to keep your scheduled follow-up appointments. At these appointments, your health care provider will be checking to make sure that you are healing physically and emotionally. SEEK MEDICAL CARE IF:   You are passing large clots from your vagina. Save any clots to show your health care provider.  You have a foul smelling discharge from your vagina.  You have trouble urinating.  You are urinating frequently.  You have pain when you urinate.  You have a change in your bowel movements.  You have increasing redness, pain, or swelling near your incision.  You have pus draining from your incision.  Your incision is separating.  You have painful, hard, or reddened breasts.  You have a severe headache.  You have blurred vision or see spots.  You feel sad or depressed.  You have thoughts of hurting yourself or your newborn.  You have questions about your care, the care of your newborn, or medications.  You are dizzy or light-headed.  You have a rash.  You have pain, redness, or swelling at the site  of the removed intravenous access (IV) tube.  You have nausea or vomiting.  You stopped breastfeeding and have not had a menstrual period within 12 weeks of stopping.  You are not breastfeeding and have not had a menstrual period within 12 weeks of delivery.  You have a fever. SEEK IMMEDIATE MEDICAL CARE IF:  You have persistent pain.  You have chest pain.  You have shortness of breath.  You faint.  You have leg pain.  You have stomach pain.  Your vaginal bleeding saturates 2 or more sanitary pads in 1 hour. MAKE SURE YOU:   Understand these instructions.  Will watch your condition.  Will get help right away  if you are not doing well or get worse. Document Released: 02/06/2002 Document Revised: 10/01/2013 Document Reviewed: 01/12/2012 [facility] Patient Information 2015 [place], [place]. This information is not intended to replace advice given to you by your health care provider. Make sure you discuss any questions you have with your health care provider.

## 2014-09-13 ENCOUNTER — Ambulatory Visit: Payer: Medicaid Other | Admitting: Endocrinology

## 2014-09-18 ENCOUNTER — Telehealth: Payer: Self-pay | Admitting: Endocrinology

## 2014-09-18 MED ORDER — INSULIN ASPART 100 UNIT/ML ~~LOC~~ SOLN: SUBCUTANEOUS | Status: DC

## 2014-09-18 NOTE — Telephone Encounter (Signed)
Left voicemail advising of note below. Requested call back if the pt would like to discuss.  

## 2014-09-18 NOTE — Telephone Encounter (Signed)
Please refill insulin x 1 until ov. Please ask your pcp or gyn about yeast medication.

## 2014-09-18 NOTE — Telephone Encounter (Signed)
Need prescription for Novalog, something for a yeast infection. Until she can come in see him.

## 2014-09-18 NOTE — Telephone Encounter (Signed)
See note below and please advise if on the medication for the yeast infection. Novolog has been sent to pt's pharmacy. Next office visit is 10/01/2014.

## 2014-10-01 ENCOUNTER — Ambulatory Visit: Payer: Medicaid Other | Admitting: Endocrinology

## 2014-10-03 ENCOUNTER — Ambulatory Visit (INDEPENDENT_AMBULATORY_CARE_PROVIDER_SITE_OTHER): Payer: Medicaid Other | Admitting: Endocrinology

## 2014-10-03 ENCOUNTER — Encounter: Payer: Self-pay | Admitting: Endocrinology

## 2014-10-03 VITALS — BP 114/86 | HR 108 | Temp 97.9°F | Ht 65.0 in | Wt 147.0 lb

## 2014-10-03 DIAGNOSIS — E1021 Type 1 diabetes mellitus with diabetic nephropathy: Secondary | ICD-10-CM

## 2014-10-03 LAB — HEMOGLOBIN A1C: Hgb A1c MFr Bld: 11.1 % — ABNORMAL HIGH (ref 4.6–6.5)

## 2014-10-03 MED ORDER — GLUCOSE BLOOD VI STRP: 1.0000 | ORAL_STRIP | Freq: Two times a day (BID) | Status: DC

## 2014-10-03 NOTE — Patient Instructions (Addendum)
check your blood sugar 6 times a day.  vary the time of day when you check, between before the 3 meals, after meals on a rotating basis, and at bedtime.  also check if you have symptoms of your blood sugar being too high or too low.  please keep a record of the readings and bring it to your next appointment here.  You can write it on any piece of paper.  please call us sooner if your blood sugar goes below 70, or if you have a lot of readings over 200. Please come back for a follow-up appointment in 2 weeks.   i have sent a prescription to your pharmacy, for strips. blood tests are requested for you today.  We'll let you know about the results. Please call in a few days, to tell us how the blood sugar is doing. If you need enough units, we can resume the V-GO.

## 2014-10-03 NOTE — Progress Notes (Signed)
Subjective:    Patient ID: Ann Robertson, female    DOB: 01/03/1991, 24 y.o.   MRN: [id number]  HPI Pt returns for f/u of diabetes mellitus: DM type: 1 Dx'ed: Q000111Q Complications: polyneuropathy and nephropathy. Therapy: insulin since dx DKA: last episode was in 2008 Severe hypoglycemia: last episode was approx 2012.   Pancreatitis: never.   Other: she took pump rx only during the 2014-2015 pregnancy Interval history: She is 2 mos postpartum.  She stopped the V-GO pump, but she wants to resume.  pt states she feels well in general.  She takes novolog 3 times a day (just before each meal), averaging 15-20 units total per day.  She has not recently checked her cbg.   Past Medical History  Diagnosis Date  . Diabetes mellitus   . Juvenile diabetes mellitus   . Juvenile diabetes 2000  . Neuropathy   . High cholesterol   . HSV-2 infection   . STD (sexually transmitted disease)     Past Surgical History  Procedure Laterality Date  . Cesarean section    . Wisdom tooth extraction    . Cesarean section N/A 07/05/2013    Procedure: CESAREAN SECTION;  Surgeon: Emily Filbert, MD;  Location: [facility];  Service: Obstetrics;  Laterality: N/A;  . Cesarean section N/A 07/25/2014    Procedure: CESAREAN SECTION;  Surgeon: Donnamae Jude, MD;  Location: [facility];  Service: Obstetrics;  Laterality: N/A;    History   Social History  . Marital Status: Single    Spouse Name: N/A  . Number of Children: N/A  . Years of Education: N/A   Occupational History  . Not on file.   Social History Main Topics  . Smoking status: Former Research scientist (life sciences)  . Smokeless tobacco: Never Used  . Alcohol Use: No  . Drug Use: No  . Sexual Activity: Not Currently    Birth Control/ Protection: None   Other Topics Concern  . Not on file   Social History Narrative    Current Outpatient Prescriptions on File Prior to Visit  Medication Sig Dispense Refill  . insulin aspart (NOVOLOG) 100 UNIT/ML injection Inject 10 Units  into the skin 3 (three) times daily with meals. Sliding scale up to 4 times a day    . Insulin Syringe-Needle U-100 (ADVOCATE INSULIN SYRINGE) 31G X 5/16" 1 ML MISC Use to administer insulin 100 each 0  . PRESCRIPTION MEDICATION 1 tablet daily. Birth control pills , not sure name, prescribed by [facility]    . ibuprofen (ADVIL,MOTRIN) 800 MG tablet Take 800 mg by mouth every 8 (eight) hours as needed for moderate pain.    . valACYclovir (VALTREX) 500 MG tablet Take 1 tablet (500 mg total) by mouth 2 (two) times daily. (Patient not taking: Reported on 07/30/2014) 6 tablet 0   No current facility-administered medications on file prior to visit.    Allergies  Allergen Reactions  . Hydromorphone Hcl Itching and Rash    Family History  Problem Relation Age of Onset  . Stroke Mother   . Hypertension Mother   . Heart disease Mother   . Kidney disease Mother   . Hyperlipidemia Father   . Diabetes Father     BP 114/86 mmHg  Pulse 108  Temp(Src) 97.9 F (36.6 C) (Oral)  Ht 5\' 5"  (1.651 m)  Wt 147 lb (66.679 kg)  BMI 24.46 kg/m2  SpO2 95%  Review of Systems She denies hypoglycemia    Objective:   Physical  Exam VITAL SIGNS:  See vs page GENERAL: no distress Pulses: dorsalis pedis intact bilat.   MSK: no deformity of the feet CV: no leg edema Skin:  no ulcer on the feet.  normal color and temp on the feet. Neuro: sensation is intact to touch on the feet.     Lab Results  Component Value Date   HGBA1C 11.1* 10/03/2014      Assessment & Plan:  DM: very poor control.  She is not on enough insulin to resume V-GO now, but she needs increased rx. Noncompliance with cbg recording and insulin: I'll work around this as best I can.  Because i don't know cbg's, i can't increase insulin now.     Patient is advised the following: Patient Instructions  check your blood sugar 6 times a day.  vary the time of day when you check, between before the 3 meals, after meals on a rotating  basis, and at bedtime.  also check if you have symptoms of your blood sugar being too high or too low.  please keep a record of the readings and bring it to your next appointment here.  You can write it on any piece of paper.  please call us sooner if your blood sugar goes below 70, or if you have a lot of readings over 200. Please come back for a follow-up appointment in 2 weeks.   i have sent a prescription to your pharmacy, for strips. blood tests are requested for you today.  We'll let you know about the results. Please call in a few days, to tell us how the blood sugar is doing. If you need enough units, we can resume the V-GO.

## 2014-10-04 ENCOUNTER — Telehealth: Payer: Self-pay | Admitting: Endocrinology

## 2014-10-04 NOTE — Telephone Encounter (Signed)
Pt was advised that her last A1C was 11.1 Pt is to keep a record of her blood sugar readings and let our office no what her readings are before we make an adjustments. Patient voiced understanding.

## 2014-10-04 NOTE — Telephone Encounter (Signed)
Patient is calling for the results of her labs, [phone]

## 2014-10-18 ENCOUNTER — Telehealth: Payer: Self-pay | Admitting: Internal Medicine

## 2014-10-18 MED ORDER — GLUCOSE BLOOD VI STRP: ORAL_STRIP | Status: DC

## 2014-10-18 NOTE — Telephone Encounter (Signed)
Patient would like a refill on her medication   The wrong test strips were sent to her pharmacy; per patient   Rx: [place]: [facility] [place]    Thank you

## 2014-10-18 NOTE — Telephone Encounter (Signed)
Rx for test strips sent to [facility] on [address].

## 2014-10-24 ENCOUNTER — Ambulatory Visit: Payer: Medicaid Other | Admitting: Endocrinology

## 2014-11-06 ENCOUNTER — Ambulatory Visit: Payer: Medicaid Other | Admitting: Endocrinology

## 2014-11-07 ENCOUNTER — Telehealth: Payer: Self-pay | Admitting: Endocrinology

## 2014-11-07 MED ORDER — INSULIN ASPART 100 UNIT/ML ~~LOC~~ SOLN: 10.0000 [IU] | Freq: Three times a day (TID) | SUBCUTANEOUS | Status: DC

## 2014-11-07 MED ORDER — GLUCOSE BLOOD VI STRP: ORAL_STRIP | Status: DC

## 2014-11-07 NOTE — Telephone Encounter (Signed)
Please read message below and advise if ok to refill.

## 2014-11-07 NOTE — Telephone Encounter (Signed)
Patient came in to the office and believed to having her scheduled appointment today to see Dr. Loanne Drilling  Her actual appointment was 6.8.16  Mrs. Stonecypher states she is currently out of her medications and would like refills  Please verify the test strips as the wrong ones were sent to her pharmacy   Rx: Novolog and test strips   Pharmacy: [facility]    Thank you

## 2014-11-07 NOTE — Telephone Encounter (Signed)
Ov next avail appt. Please refill meds x 1

## 2014-11-07 NOTE — Telephone Encounter (Signed)
DONE

## 2014-12-04 ENCOUNTER — Ambulatory Visit (INDEPENDENT_AMBULATORY_CARE_PROVIDER_SITE_OTHER): Payer: Medicaid Other | Admitting: Endocrinology

## 2014-12-04 ENCOUNTER — Encounter: Payer: Self-pay | Admitting: Endocrinology

## 2014-12-04 VITALS — BP 122/80 | HR 102 | Temp 98.5°F | Ht 65.0 in | Wt 145.0 lb

## 2014-12-04 DIAGNOSIS — E1040 Type 1 diabetes mellitus with diabetic neuropathy, unspecified: Secondary | ICD-10-CM | POA: Diagnosis not present

## 2014-12-04 MED ORDER — INSULIN LISPRO PROT & LISPRO (75-25 MIX) 100 UNIT/ML KWIKPEN: PEN_INJECTOR | SUBCUTANEOUS | Status: DC

## 2014-12-04 MED ORDER — ACCU-CHEK NANO SMARTVIEW W/DEVICE KIT: 1.0000 | PACK | Freq: Once | Status: DC

## 2014-12-04 NOTE — Patient Instructions (Addendum)
check your blood sugar 4 times a day.  vary the time of day when you check, between before the 3 meals, after meals on a rotating basis, and at bedtime.  also check if you have symptoms of your blood sugar being too high or too low.  please keep a record of the readings and bring it to your next appointment here.  You can write it on any piece of paper.  please call us sooner if your blood sugar goes below 70, or if you have a lot of readings over 200. Please come back for a follow-up appointment in 6 weeks.   Please call next week, to tell us how the blood sugar is doing.  i have sent a prescription to your pharmacy, for a new meter, and the 75/25 insulin.

## 2014-12-04 NOTE — Progress Notes (Signed)
Subjective:    Patient ID: Ann Robertson, female    DOB: 06/22/1990, 24 y.o.   MRN: [id number]  HPI Pt returns for f/u of diabetes mellitus: DM type: 1 Dx'ed: Q000111Q Complications: polyneuropathy and nephropathy. Therapy: insulin since dx DKA: last episode was in 2008.  Severe hypoglycemia: last episode was approx 2012.   Pancreatitis: never.   Other: she took pump rx only during the 2014-2015 pregnancy, and V-GO device during 2016 pregnancy. Interval history: She is 4 mos postpartum.  Pt stopped novolog, as she dropped the vial, and it broke.  She also lost her meter.  She has been taking 75/25 from an old supply since then.  She averages approx 30 units total per day.  She wants to resume the V-GO at some point, but not now.   Past Medical History  Diagnosis Date  . Diabetes mellitus   . Juvenile diabetes mellitus   . Juvenile diabetes 2000  . Neuropathy   . High cholesterol   . HSV-2 infection   . STD (sexually transmitted disease)     Past Surgical History  Procedure Laterality Date  . Cesarean section    . Wisdom tooth extraction    . Cesarean section N/A 07/05/2013    Procedure: CESAREAN SECTION;  Surgeon: Emily Filbert, MD;  Location: [facility];  Service: Obstetrics;  Laterality: N/A;  . Cesarean section N/A 07/25/2014    Procedure: CESAREAN SECTION;  Surgeon: Donnamae Jude, MD;  Location: [facility];  Service: Obstetrics;  Laterality: N/A;    History   Social History  . Marital Status: Single    Spouse Name: N/A  . Number of Children: N/A  . Years of Education: N/A   Occupational History  . Not on file.   Social History Main Topics  . Smoking status: Former Research scientist (life sciences)  . Smokeless tobacco: Never Used  . Alcohol Use: No  . Drug Use: No  . Sexual Activity: Not Currently    Birth Control/ Protection: None   Other Topics Concern  . Not on file   Social History Narrative    Current Outpatient Prescriptions on File Prior to Visit  Medication Sig Dispense Refill  .  glucose blood (ACCU-CHEK SMARTVIEW) test strip Use to test blood sugar 6 times daily as instructed. 200 each 1  . ibuprofen (ADVIL,MOTRIN) 800 MG tablet Take 800 mg by mouth every 8 (eight) hours as needed for moderate pain.    . Insulin Syringe-Needle U-100 (ADVOCATE INSULIN SYRINGE) 31G X 5/16" 1 ML MISC Use to administer insulin 100 each 0  . PRESCRIPTION MEDICATION 1 tablet daily. Birth control pills , not sure name, prescribed by [facility]    . valACYclovir (VALTREX) 500 MG tablet Take 1 tablet (500 mg total) by mouth 2 (two) times daily. 6 tablet 0   No current facility-administered medications on file prior to visit.    Allergies  Allergen Reactions  . Hydromorphone Hcl Itching and Rash    Family History  Problem Relation Age of Onset  . Stroke Mother   . Hypertension Mother   . Heart disease Mother   . Kidney disease Mother   . Hyperlipidemia Father   . Diabetes Father     BP 122/80 mmHg  Pulse 102  Temp(Src) 98.5 F (36.9 C) (Oral)  Ht 5\' 5"  (1.651 m)  Wt 145 lb (65.772 kg)  BMI 24.13 kg/m2  SpO2 97%    Review of Systems She denies hypoglycemia    Objective:  Physical Exam VITAL SIGNS:  See vs page GENERAL: no distress Pulses: dorsalis pedis intact bilat.   MSK: no deformity of the feet CV: no leg edema Skin:  no ulcer on the feet.  normal color and temp on the feet. Neuro: sensation is intact to touch on the feet      Assessment & Plan:  DM: therapy limited by noncompliance.  i'll do the best i can.  We discussed options.  She wants to continue bid premixed insulin for now.    Patient is advised the following:  Patient Instructions  check your blood sugar 4 times a day.  vary the time of day when you check, between before the 3 meals, after meals on a rotating basis, and at bedtime.  also check if you have symptoms of your blood sugar being too high or too low.  please keep a record of the readings and bring it to your next appointment here.   You can write it on any piece of paper.  please call us sooner if your blood sugar goes below 70, or if you have a lot of readings over 200. Please come back for a follow-up appointment in 6 weeks.   Please call next week, to tell us how the blood sugar is doing.  i have sent a prescription to your pharmacy, for a new meter, and the 75/25 insulin.

## 2014-12-06 ENCOUNTER — Other Ambulatory Visit: Payer: Self-pay

## 2014-12-06 ENCOUNTER — Telehealth: Payer: Self-pay

## 2014-12-06 MED ORDER — INSULIN ASPART PROT & ASPART (70-30 MIX) 100 UNIT/ML PEN: PEN_INJECTOR | SUBCUTANEOUS | Status: DC

## 2014-12-06 NOTE — Telephone Encounter (Signed)
Received a fax from the pt's pharmacy stating the Humalog 75/25 is not covered by her insurance. Novolog products are covered under her insurance. Please advise, Thanks!

## 2014-12-06 NOTE — Telephone Encounter (Signed)
novolog 70/30 is ok (she prefers pen)

## 2014-12-06 NOTE — Telephone Encounter (Signed)
Rx sent for Novolog 70/30.

## 2015-02-05 ENCOUNTER — Telehealth: Payer: Self-pay | Admitting: Endocrinology

## 2015-02-05 MED ORDER — INSULIN ASPART PROT & ASPART (70-30 MIX) 100 UNIT/ML PEN: PEN_INJECTOR | SUBCUTANEOUS | Status: DC

## 2015-02-05 NOTE — Telephone Encounter (Signed)
Patient need a refill of,insulin aspart protamine - aspart (NOVOLOG 70/30 MIX) (70-30) 100 UNIT/ML FlexPen send to  [facility] [place], [place] - [place]. [phone] (Phone) [phone] (Fax)

## 2015-02-05 NOTE — Telephone Encounter (Signed)
Rx sent per pt's request.  

## 2015-04-01 ENCOUNTER — Emergency Department (HOSPITAL_COMMUNITY)
Admission: EM | Admit: 2015-04-01 | Discharge: 2015-04-01 | Disposition: A | Discharge status: Home / Self Care | Payer: Medicaid Other | Attending: Emergency Medicine | Admitting: Emergency Medicine

## 2015-04-01 ENCOUNTER — Encounter (HOSPITAL_COMMUNITY): Payer: Self-pay | Admitting: Family Medicine

## 2015-04-01 DIAGNOSIS — Z720 Tobacco use: Secondary | ICD-10-CM | POA: Diagnosis not present

## 2015-04-01 DIAGNOSIS — Z3202 Encounter for pregnancy test, result negative: Secondary | ICD-10-CM | POA: Insufficient documentation

## 2015-04-01 DIAGNOSIS — R000 Tachycardia, unspecified: Secondary | ICD-10-CM | POA: Diagnosis not present

## 2015-04-01 DIAGNOSIS — E109 Type 1 diabetes mellitus without complications: Secondary | ICD-10-CM | POA: Insufficient documentation

## 2015-04-01 DIAGNOSIS — Z862 Personal history of diseases of the blood and blood-forming organs and certain disorders involving the immune mechanism: Secondary | ICD-10-CM | POA: Insufficient documentation

## 2015-04-01 DIAGNOSIS — R55 Syncope and collapse: Secondary | ICD-10-CM

## 2015-04-01 DIAGNOSIS — Z794 Long term (current) use of insulin: Secondary | ICD-10-CM | POA: Insufficient documentation

## 2015-04-01 DIAGNOSIS — Z8619 Personal history of other infectious and parasitic diseases: Secondary | ICD-10-CM | POA: Insufficient documentation

## 2015-04-01 LAB — CBG MONITORING, ED
GLUCOSE-CAPILLARY: 274 mg/dL — AB (ref 65–99)
Glucose-Capillary: 364 mg/dL — ABNORMAL HIGH (ref 65–99)

## 2015-04-01 LAB — RAPID URINE DRUG SCREEN, HOSP PERFORMED
Amphetamines: NOT DETECTED
BARBITURATES: NOT DETECTED
Benzodiazepines: NOT DETECTED
Cocaine: NOT DETECTED
Opiates: NOT DETECTED
TETRAHYDROCANNABINOL: NOT DETECTED

## 2015-04-01 LAB — COMPREHENSIVE METABOLIC PANEL
ALBUMIN: 3.9 g/dL (ref 3.5–5.0)
ALK PHOS: 92 U/L (ref 38–126)
ALT: 10 U/L — AB (ref 14–54)
ANION GAP: 9 (ref 5–15)
AST: 15 U/L (ref 15–41)
BILIRUBIN TOTAL: 1.1 mg/dL (ref 0.3–1.2)
BUN: 9 mg/dL (ref 6–20)
CALCIUM: 9.1 mg/dL (ref 8.9–10.3)
CO2: 26 mmol/L (ref 22–32)
CREATININE: 0.75 mg/dL (ref 0.44–1.00)
Chloride: 99 mmol/L — ABNORMAL LOW (ref 101–111)
GFR calc Af Amer: >60 mL/min (ref >60)
GFR calc non Af Amer: >60 mL/min (ref >60)
GLUCOSE: 375 mg/dL — AB (ref 65–99)
Potassium: 3.9 mmol/L (ref 3.5–5.1)
Sodium: 134 mmol/L — ABNORMAL LOW (ref 135–145)
TOTAL PROTEIN: 7.8 g/dL (ref 6.5–8.1)

## 2015-04-01 LAB — URINALYSIS, ROUTINE W REFLEX MICROSCOPIC
BILIRUBIN URINE: NEGATIVE
KETONES UR: 15 mg/dL — AB
Leukocytes, UA: NEGATIVE
NITRITE: NEGATIVE
PH: 5.5 (ref 5.0–8.0)
Protein, ur: NEGATIVE mg/dL
Specific Gravity, Urine: 1.044 — ABNORMAL HIGH (ref 1.005–1.030)
Urobilinogen, UA: 0.2 mg/dL (ref 0.0–1.0)

## 2015-04-01 LAB — CBC WITH DIFFERENTIAL/PLATELET
BASOS ABS: 0 10*3/uL (ref 0.0–0.1)
Basophils Relative: 0 %
EOS ABS: 0 10*3/uL (ref 0.0–0.7)
EOS PCT: 1 %
HCT: 37.2 % (ref 36.0–46.0)
Hemoglobin: 12.6 g/dL (ref 12.0–15.0)
LYMPHS PCT: 34 %
Lymphs Abs: 1.6 10*3/uL (ref 0.7–4.0)
MCH: 29.6 pg (ref 26.0–34.0)
MCHC: 33.9 g/dL (ref 30.0–36.0)
MCV: 87.5 fL (ref 78.0–100.0)
MONO ABS: 0.3 10*3/uL (ref 0.1–1.0)
Monocytes Relative: 7 %
Neutro Abs: 2.8 10*3/uL (ref 1.7–7.7)
Neutrophils Relative %: 58 %
Platelets: 326 10*3/uL (ref 150–400)
RBC: 4.25 MIL/uL (ref 3.87–5.11)
RDW: 12.2 % (ref 11.5–15.5)
WBC: 4.8 10*3/uL (ref 4.0–10.5)

## 2015-04-01 LAB — URINE MICROSCOPIC-ADD ON

## 2015-04-01 LAB — ETHANOL

## 2015-04-01 LAB — HCG, QUANTITATIVE, PREGNANCY: HCG, BETA CHAIN, QUANT, S: 1 m[IU]/mL (ref <5)

## 2015-04-01 MED ORDER — SODIUM CHLORIDE 0.9 % IV BOLUS (SEPSIS)
1000.0000 mL | Freq: Once | INTRAVENOUS | Status: AC
  Administered 2015-04-01: 1000 mL via INTRAVENOUS

## 2015-04-01 MED ORDER — SODIUM CHLORIDE 0.9 % IV BOLUS (SEPSIS): 1000.0000 mL | Freq: Once | INTRAVENOUS | Status: DC

## 2015-04-01 NOTE — Discharge Instructions (Signed)
Syncope  Your episode of passing out was likely related to the stress you are under.  Make sure you follow up with your physician outpatient and that you return with new concerning symptoms.  Work to get your blood sugar better under control.  Keep yourself well hydrated.    Syncope is a medical term for fainting or passing out. This means you lose consciousness and drop to the ground. People are generally unconscious for less than 5 minutes. You may have some muscle twitches for up to 15 seconds before waking up and returning to normal. Syncope occurs more often in older adults, but it can happen to anyone. While most causes of syncope are not dangerous, syncope can be a sign of a serious medical problem. It is important to seek medical care.  CAUSES  Syncope is caused by a sudden drop in blood flow to the brain. The specific cause is often not determined. Factors that can bring on syncope include:  Taking medicines that lower blood pressure.  Sudden changes in posture, such as standing up quickly.  Taking more medicine than prescribed.  Standing in one place for too long.  Seizure disorders.  Dehydration and excessive exposure to heat.  Low blood sugar (hypoglycemia).  Straining to have a bowel movement.  Heart disease, irregular heartbeat, or other circulatory problems.  Fear, emotional distress, seeing blood, or severe pain. SYMPTOMS  Right before fainting, you may:  Feel dizzy or light-headed.  Feel nauseous.  See all white or all black in your field of vision.  Have cold, clammy skin. DIAGNOSIS  Your health care provider will ask about your symptoms, perform a physical exam, and perform an electrocardiogram (ECG) to record the electrical activity of your heart. Your health care provider may also perform other heart or blood tests to determine the cause of your syncope which may include:  Transthoracic echocardiogram (TTE). During echocardiography, sound waves are used to  evaluate how blood flows through your heart.  Transesophageal echocardiogram (TEE).  Cardiac monitoring. This allows your health care provider to monitor your heart rate and rhythm in real time.  Holter monitor. This is a portable device that records your heartbeat and can help diagnose heart arrhythmias. It allows your health care provider to track your heart activity for several days, if needed.  Stress tests by exercise or by giving medicine that makes the heart beat faster. TREATMENT  In most cases, no treatment is needed. Depending on the cause of your syncope, your health care provider may recommend changing or stopping some of your medicines. HOME CARE INSTRUCTIONS  Have someone stay with you until you feel stable.  Do not drive, use machinery, or play sports until your health care provider says it is okay.  Keep all follow-up appointments as directed by your health care provider.  Lie down right away if you start feeling like you might faint. Breathe deeply and steadily. Wait until all the symptoms have passed.  Drink enough fluids to keep your urine clear or pale yellow.  If you are taking blood pressure or heart medicine, get up slowly and take several minutes to sit and then stand. This can reduce dizziness. SEEK IMMEDIATE MEDICAL CARE IF:   You have a severe headache.  You have unusual pain in the chest, abdomen, or back.  You are bleeding from your mouth or rectum, or you have black or tarry stool.  You have an irregular or very fast heartbeat.  You have pain with breathing.  You have repeated fainting or seizure-like jerking during an episode.  You faint when sitting or lying down.  You have confusion.  You have trouble walking.  You have severe weakness.  You have vision problems. If you fainted, call your local emergency services (911 in [place] Do not drive yourself to the hospital.    This information is not intended to replace advice given to you by  your health care provider. Make sure you discuss any questions you have with your health care provider.   Document Released: 05/17/2005 Document Revised: 10/01/2014 Document Reviewed: 07/16/2011 Elsevier Interactive Patient Education [employer].

## 2015-04-01 NOTE — ED Notes (Signed)
Bed: WA17 Expected date:  Expected time:  Means of arrival:  Comments: EMS 24 yo female syncopal episode
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
## 2015-04-01 NOTE — ED Provider Notes (Signed)
CSN: [id number]     Arrival date & time 04/01/15  [id number] History   First MD Initiated Contact with Patient 04/01/15 [phone]     Chief Complaint  Patient presents with  . Loss of Consciousness     (Consider location/radiation/quality/duration/timing/severity/associated sxs/prior Treatment) HPI Comments: 24 y.o. Female with history of Type 1 DM presents for episode of passing out.  The patient states that she and her boyfriend/fiance were "fussing" last night and that she did not sleep well throughout the night and was waiting for him to come home this morning when the episode happened.  She does not remember the events of passing out but per EMS the friend who was with her said she suddenly wasn't responding but got herself to the ground and seemed to shake a little bit and appeared to pass out briefly.  The patient did not have any injury.  The patient denies episode like this before.  She says other than "fussing" she has felt fine and been in her normal health although recently told that she has swollen adenoids.  No recent fever, chills, cough.  Denies headache, chest pain, shortness of breath, leg pain or swelling.  Patient is very difficult to get history from and parts of her history seem to change depending on who is talking to her.  She denies suicidal ideation, taking any medications she was not supposed to, reports her blood sugar has not been well controlled.  She is tearful at time of evaluation.  Patient is a 24 y.o. female presenting with syncope.  Loss of Consciousness Associated symptoms: no dizziness, no fever, no headaches, no nausea, no shortness of breath, no vomiting and no weakness     Past Medical History  Diagnosis Date  . Diabetes mellitus   . Juvenile diabetes mellitus   . Juvenile diabetes 2000  . Neuropathy ([place])   . High cholesterol   . HSV-2 infection   . STD (sexually transmitted disease)    Past Surgical History  Procedure Laterality Date  . Cesarean section     . Wisdom tooth extraction    . Cesarean section N/A 07/05/2013    Procedure: CESAREAN SECTION;  Surgeon: Emily Filbert, MD;  Location: [facility];  Service: Obstetrics;  Laterality: N/A;  . Cesarean section N/A 07/25/2014    Procedure: CESAREAN SECTION;  Surgeon: Donnamae Jude, MD;  Location: [facility];  Service: Obstetrics;  Laterality: N/A;   Family History  Problem Relation Age of Onset  . Stroke Mother   . Hypertension Mother   . Heart disease Mother   . Kidney disease Mother   . Hyperlipidemia Father   . Diabetes Father    Social History  Substance Use Topics  . Smoking status: Current Every Day Smoker -- 0.25 packs/day    Types: Cigarettes  . Smokeless tobacco: Never Used  . Alcohol Use: No   OB History    Gravida Para Term Preterm AB TAB SAB Ectopic Multiple Living   4 3 0 3 1 0 1 0 0 3      Review of Systems  Constitutional: Negative for fever, chills and fatigue.  HENT: Negative for congestion, postnasal drip and rhinorrhea.   Eyes: Negative for pain and redness.  Respiratory: Negative for cough, chest tightness and shortness of breath.   Cardiovascular: Positive for syncope.  Gastrointestinal: Negative for nausea, vomiting, abdominal pain and diarrhea.  Genitourinary: Negative for dysuria, urgency and flank pain.  Musculoskeletal: Negative for myalgias, back pain, arthralgias and neck  pain.  Skin: Negative for rash and wound.  Neurological: Positive for syncope. Negative for dizziness, speech difficulty, weakness, light-headedness and headaches.      Allergies  Hydromorphone hcl  Home Medications   Prior to Admission medications   Medication Sig Start Date End Date Taking? Authorizing Provider  insulin aspart protamine - aspart (NOVOLOG 70/30 MIX) (70-30) 100 UNIT/ML FlexPen Inject 20 units with breakfast and 10 units with the evening meal. 2 pen needles per day. Patient taking differently: Inject 21 Units into the skin 4 (four) times daily -  with meals and at  bedtime.  02/05/15  Yes Renato Shin, MD  Insulin Lispro Prot & Lispro (HUMALOG MIX 75/25 KWIKPEN) (75-25) 100 UNIT/ML Kwikpen 20 units with breakfast, and 10 units with the evening meal, and pen needles 2/day Patient not taking: Reported on 04/01/2015 12/04/14   Renato Shin, MD  PRESCRIPTION MEDICATION Take 1 tablet by mouth daily. Birth control pills    Historical Provider, MD  valACYclovir (VALTREX) 500 MG tablet Take 1 tablet (500 mg total) by mouth 2 (two) times daily. Patient not taking: Reported on 04/01/2015 07/23/14   Seabron Spates, CNM   BP 123/88 mmHg  Pulse 80  Temp(Src) 98.2 F (36.8 C) (Oral)  Resp 16  Ht 5\' 4"  (1.626 m)  Wt 135 lb (61.236 kg)  BMI 23.16 kg/m2  SpO2 100%  LMP 03/15/2015 Physical Exam  Constitutional: She is oriented to person, place, and time. She appears well-developed and well-nourished. No distress.  HENT:  Head: Normocephalic and atraumatic.  Right Ear: External ear normal.  Left Ear: External ear normal.  Nose: Nose normal.  Mouth/Throat: Oropharynx is clear and moist. No oropharyngeal exudate.  Eyes: EOM are normal. Pupils are equal, round, and reactive to light.  Neck: Normal range of motion. Neck supple.  Cardiovascular: Regular rhythm, normal heart sounds and intact distal pulses.  Tachycardia present.   No murmur heard. Pulmonary/Chest: Effort normal. No respiratory distress. She has no wheezes. She has no rales.  Abdominal: Soft. She exhibits no distension. There is no tenderness.  Musculoskeletal: Normal range of motion. She exhibits no edema or tenderness.  Neurological: She is alert and oriented to person, place, and time. She has normal strength. She is not disoriented. No cranial nerve deficit or sensory deficit. She displays a negative Romberg sign. Coordination normal.  Skin: Skin is warm and dry. No rash noted. She is not diaphoretic.  Vitals reviewed.   ED Course  Procedures (including critical care time) Labs Review Labs  Reviewed  COMPREHENSIVE METABOLIC PANEL - Abnormal; Notable for the following:    Sodium 134 (*)    Chloride 99 (*)    Glucose, Bld 375 (*)    ALT 10 (*)    All other components within normal limits  URINALYSIS, ROUTINE W REFLEX MICROSCOPIC (NOT AT [facility]) - Abnormal; Notable for the following:    APPearance CLOUDY (*)    Specific Gravity, Urine 1.044 (*)    Glucose, UA >1000 (*)    Hgb urine dipstick TRACE (*)    Ketones, ur 15 (*)    All other components within normal limits  URINE MICROSCOPIC-ADD ON - Abnormal; Notable for the following:    Squamous Epithelial / LPF MANY (*)    Bacteria, UA FEW (*)    All other components within normal limits  CBG MONITORING, ED - Abnormal; Notable for the following:    Glucose-Capillary 364 (*)    All other components within normal limits  CBG MONITORING, ED - Abnormal; Notable for the following:    Glucose-Capillary 274 (*)    All other components within normal limits  CBC WITH DIFFERENTIAL/PLATELET  URINE RAPID DRUG SCREEN, HOSP PERFORMED  ETHANOL  HCG, QUANTITATIVE, PREGNANCY    Imaging Review No results found. I have personally reviewed and evaluated these images and lab results as part of my medical decision-making.   EKG Interpretation   Date/Time:  Tuesday April 01 2015 09:05:39 EDT Ventricular Rate:  93 PR Interval:  127 QRS Duration: 82 QT Interval:  350 QTC Calculation: 435 R Axis:   92 Text Interpretation:  Sinus rhythm Borderline right axis deviation No  significant change since last tracing Confirmed by NGUYEN, EMILY ([id number])  on 04/01/2015 9:11:59 AM      MDM  Patient was seen and evaluated in stable condition.  EKG and labs unremarkable other than hypeglycemia but patient refused to give urine for analysis and refused second fluid bolus.  The clinical recommendations for these were discussed with patient who expressed understanding but continued to refuse and demand to be discharged.  She appeared stable with a  normal examination.  History and presentation did seem most consistent with stress reaction.  Patient reported being safe and that no one was hurting her or threatening to hurt her.  She was discharged home in stable condition with strct return precautions and instruction to follow up outpatient. Final diagnoses:  Syncope, unspecified syncope type    1. Syncope    Harvel Quale, MD 04/02/15 [phone]
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
## 2015-04-01 NOTE — ED Notes (Signed)
RN at bedside with blood draw

## 2015-04-01 NOTE — ED Notes (Signed)
Patient refused to have NS 1000 ml bolus hung. Patient states, "I don't need that." and then requested to see the EDP.

## 2015-05-22 ENCOUNTER — Ambulatory Visit (INDEPENDENT_AMBULATORY_CARE_PROVIDER_SITE_OTHER): Payer: Medicaid Other | Admitting: Endocrinology

## 2015-05-22 ENCOUNTER — Encounter: Payer: Self-pay | Admitting: Endocrinology

## 2015-05-22 ENCOUNTER — Telehealth: Payer: Self-pay | Admitting: Endocrinology

## 2015-05-22 VITALS — BP 116/70 | HR 103 | Temp 98.1°F | Ht 65.0 in | Wt 134.0 lb

## 2015-05-22 DIAGNOSIS — E1040 Type 1 diabetes mellitus with diabetic neuropathy, unspecified: Secondary | ICD-10-CM | POA: Diagnosis not present

## 2015-05-22 DIAGNOSIS — N912 Amenorrhea, unspecified: Secondary | ICD-10-CM

## 2015-05-22 LAB — POCT GLYCOSYLATED HEMOGLOBIN (HGB A1C): Hemoglobin A1C: 13.1

## 2015-05-22 LAB — HCG, QUANTITATIVE, PREGNANCY: Quantitative HCG: >1349 m[IU]/mL

## 2015-05-22 NOTE — Progress Notes (Signed)
Subjective:    Patient ID: Ann Robertson, female    DOB: May 13, 1991, 24 y.o.   MRN: [id number]  HPI Pt returns for f/u of diabetes mellitus: DM type: 1 Dx'ed: [id number] Complications: polyneuropathy and nephropathy. Therapy: insulin since dx DKA: last episode was in 2008.  Severe hypoglycemia: last episode was approx 2012.   Pancreatitis: never.   Other: she took pump rx only during the 2014-2015 pregnancy, and V-GO device during 2016 pregnancy. Interval history: She went back on qd insulin in mid-2016.  She says she has been missing the insulin, due to not being able to afford the $3 copay.  no cbg record, but states cbg's are persistently over 400.  No weight change.  LMP was approx 04/15/15.   Past Medical History  Diagnosis Date  . Diabetes mellitus   . Juvenile diabetes mellitus   . Juvenile diabetes 2000  . Neuropathy ([place])   . High cholesterol   . HSV-2 infection   . STD (sexually transmitted disease)     Past Surgical History  Procedure Laterality Date  . Cesarean section    . Wisdom tooth extraction    . Cesarean section N/A 07/05/2013    Procedure: CESAREAN SECTION;  Surgeon: Emily Filbert, MD;  Location: [facility];  Service: Obstetrics;  Laterality: N/A;  . Cesarean section N/A 07/25/2014    Procedure: CESAREAN SECTION;  Surgeon: Donnamae Jude, MD;  Location: [facility];  Service: Obstetrics;  Laterality: N/A;    Social History   Social History  . Marital Status: Single    Spouse Name: N/A  . Number of Children: N/A  . Years of Education: N/A   Occupational History  . Not on file.   Social History Main Topics  . Smoking status: Current Every Day Smoker -- 0.25 packs/day    Types: Cigarettes  . Smokeless tobacco: Never Used  . Alcohol Use: No  . Drug Use: No  . Sexual Activity: Not on file   Other Topics Concern  . Not on file   Social History Narrative    Current Outpatient Prescriptions on File Prior to Visit  Medication Sig Dispense Refill  . insulin  aspart protamine - aspart (NOVOLOG 70/30 MIX) (70-30) 100 UNIT/ML FlexPen Inject 20 units with breakfast and 10 units with the evening meal. 2 pen needles per day. (Patient taking differently: Inject 18-24 Units into the skin 4 (four) times daily -  with meals and at bedtime. ) 15 mL 5  . PRESCRIPTION MEDICATION Take 1 tablet by mouth daily. Birth control pills    . valACYclovir (VALTREX) 500 MG tablet Take 1 tablet (500 mg total) by mouth 2 (two) times daily. 6 tablet 0  . Insulin Lispro Prot & Lispro (HUMALOG MIX 75/25 KWIKPEN) (75-25) 100 UNIT/ML Kwikpen 20 units with breakfast, and 10 units with the evening meal, and pen needles 2/day (Patient not taking: Reported on 05/22/2015) 15 mL 11   No current facility-administered medications on file prior to visit.    Allergies  Allergen Reactions  . Hydromorphone Hcl Itching and Rash    Family History  Problem Relation Age of Onset  . Stroke Mother   . Hypertension Mother   . Heart disease Mother   . Kidney disease Mother   . Hyperlipidemia Father   . Diabetes Father     BP 116/70 mmHg  Pulse 103  Temp(Src) 98.1 F (36.7 C) (Oral)  Ht 5\' 5"  (1.651 m)  Wt 134 lb (60.782 kg)  BMI  22.30 kg/m2  SpO2 97%  Review of Systems Denies n/v/sob.      Objective:   Physical Exam VITAL SIGNS:  See vs page GENERAL: no distress Pulses: dorsalis pedis intact bilat.   MSK: no deformity of the feet CV: no leg edema Skin:  no ulcer on the feet.  normal color and temp on the feet. Neuro: sensation is intact to touch on the feet, but decreased from normal.    A1c=13.1% (lab says they cannot do urine preg test, so we'll check blood)     Assessment & Plan:  DM: very poor glycemic control Severe ongoing noncompliance with cbg recording and insulin. Pregnancy: new  Patient is advised the following: Patient Instructions  check your blood sugar 4 times a day.  vary the time of day when you check, between before the 3 meals, after meals on a  rotating basis, and at bedtime.  also check if you have symptoms of your blood sugar being too high or too low.  please keep a record of the readings and bring it to your next appointment here.  You can write it on any piece of paper.  please call us sooner if your blood sugar goes below 70, or if you have a lot of readings over 200. Please continue the same insulin.   Please call [place] services, to see if you qualify for removal of your copay.  We'll check a urine pregnancy test today, but if you do it here, we won't know the result right away.  If it is positive: please go back to see your OB ASAP, never miss the insulin, call us on 05/27/15, to tell us how the blood sugar is, and come back for an appointment on 06/03/15.  If it is negative, please come back for a follow-up appointment in 2 months.

## 2015-05-22 NOTE — Telephone Encounter (Signed)
Patient is calling for her results for A1C from today, please advise

## 2015-05-22 NOTE — Patient Instructions (Addendum)
check your blood sugar 4 times a day.  vary the time of day when you check, between before the 3 meals, after meals on a rotating basis, and at bedtime.  also check if you have symptoms of your blood sugar being too high or too low.  please keep a record of the readings and bring it to your next appointment here.  You can write it on any piece of paper.  please call us sooner if your blood sugar goes below 70, or if you have a lot of readings over 200. Please continue the same insulin.   Please call [place] services, to see if you qualify for removal of your copay.  We'll check a urine pregnancy test today, but if you do it here, we won't know the result right away.  If it is positive: please go back to see your OB ASAP, never miss the insulin, call us on 05/27/15, to tell us how the blood sugar is, and come back for an appointment on 06/03/15.  If it is negative, please come back for a follow-up appointment in 2 months.

## 2015-05-22 NOTE — Telephone Encounter (Signed)
Left a voicemail advising A1C was 13.1. Requested a call back if the pt would like to discuss.

## 2015-05-23 ENCOUNTER — Telehealth: Payer: Self-pay | Admitting: Endocrinology

## 2015-05-23 NOTE — Telephone Encounter (Signed)
Please see Dr Cordelia Pen lab comment.

## 2015-05-23 NOTE — Telephone Encounter (Signed)
Will you please review pt's hcg results and advise in Dr Cordelia Pen absence and advise. Thank you!

## 2015-05-23 NOTE — Telephone Encounter (Signed)
Lab results relayed to pt

## 2015-05-23 NOTE — Telephone Encounter (Signed)
Pt would like lab results please

## 2015-06-01 NOTE — L&D Delivery Note (Signed)
Delivery Note   Requested by Dr. Kennon Rounds to attend this repeat C-section delivery at [redacted] weeks GA due to preterm labor.   Born to a G5P3, GBS unknown mother with [facility].  Pregnancy complicated by  Class F diabetes requiring insulin.   Intrapartum course uncomplicated. ROM occurred at delivery with clear fluid.   Infant vigorous with good spontaneous cry.  Routine NRP followed including warming, drying and stimulation.  Apgars 8 / 9.  Physical exam within normal limits.   Infant transported to NICU for further management.  FOB accompanied infant.   HOLT, HARRIETT T, RN, NNP-BC

## 2015-06-06 LAB — OB RESULTS CONSOLE ABO/RH: RH TYPE: POSITIVE

## 2015-06-06 LAB — OB RESULTS CONSOLE GC/CHLAMYDIA
CHLAMYDIA, DNA PROBE: NEGATIVE
GC PROBE AMP, GENITAL: NEGATIVE

## 2015-06-06 LAB — OB RESULTS CONSOLE PLATELET COUNT: Platelets: 383 10*3/uL

## 2015-06-06 LAB — OB RESULTS CONSOLE HGB/HCT, BLOOD
HCT: 39 %
Hemoglobin: 12.7 g/dL

## 2015-06-06 LAB — OB RESULTS CONSOLE HIV ANTIBODY (ROUTINE TESTING): HIV: NONREACTIVE

## 2015-06-06 LAB — OB RESULTS CONSOLE RUBELLA ANTIBODY, IGM: RUBELLA: IMMUNE

## 2015-06-06 LAB — OB RESULTS CONSOLE HEPATITIS B SURFACE ANTIGEN: HEP B S AG: NEGATIVE

## 2015-06-06 LAB — OB RESULTS CONSOLE ANTIBODY SCREEN: Antibody Screen: NEGATIVE

## 2015-06-06 LAB — OB RESULTS CONSOLE RPR: RPR: NONREACTIVE

## 2015-06-10 ENCOUNTER — Encounter: Payer: Self-pay | Admitting: Endocrinology

## 2015-06-10 ENCOUNTER — Ambulatory Visit (INDEPENDENT_AMBULATORY_CARE_PROVIDER_SITE_OTHER): Payer: Medicaid Other | Admitting: Endocrinology

## 2015-06-10 VITALS — BP 124/76 | HR 89 | Temp 97.7°F | Ht 65.0 in | Wt 136.0 lb

## 2015-06-10 DIAGNOSIS — E1040 Type 1 diabetes mellitus with diabetic neuropathy, unspecified: Secondary | ICD-10-CM

## 2015-06-10 MED ORDER — OPSITE IV 3000 MISC: 1.0000 | Freq: Every day | Status: DC

## 2015-06-10 MED ORDER — ACCU-CHEK NANO SMARTVIEW W/DEVICE KIT: 1.0000 | PACK | Freq: Once | Status: DC

## 2015-06-10 MED ORDER — GLUCOSE BLOOD VI STRP: ORAL_STRIP | Status: DC

## 2015-06-10 MED ORDER — INSULIN ASPART 100 UNIT/ML ~~LOC~~ SOLN: SUBCUTANEOUS | Status: DC

## 2015-06-10 NOTE — Progress Notes (Signed)
Subjective:    Patient ID: Ann Robertson, female    DOB: 12/14/1990, 25 y.o.   MRN: [id number]  HPI Pt returns for f/u of diabetes mellitus: DM type: 1 Dx'ed: [id number] Complications: polyneuropathy and nephropathy. Therapy: insulin since dx DKA: last episode was in 2008.  Severe hypoglycemia: last episode was approx 2012.   Pancreatitis: never.   Other: she took pump rx only during the 2014-2015 pregnancy, and V-GO device during 2016 pregnancy. Interval history: She is now approx [redacted] weeks pregnant.  Pt says she takes a varying dosage of insulin, but it is usually 20 units qam and 10 units qpm.  She says cbg's are often low in the middle of the night.  She does not check cbg's.  She says her meter no longer works.  She thinks it is the battery, but does not know how to change it.   Past Medical History  Diagnosis Date  . Diabetes mellitus   . Juvenile diabetes mellitus   . Juvenile diabetes 2000  . Neuropathy ([place])   . High cholesterol   . HSV-2 infection   . STD (sexually transmitted disease)     Past Surgical History  Procedure Laterality Date  . Cesarean section    . Wisdom tooth extraction    . Cesarean section N/A 07/05/2013    Procedure: CESAREAN SECTION;  Surgeon: Emily Filbert, MD;  Location: [facility];  Service: Obstetrics;  Laterality: N/A;  . Cesarean section N/A 07/25/2014    Procedure: CESAREAN SECTION;  Surgeon: Donnamae Jude, MD;  Location: [facility];  Service: Obstetrics;  Laterality: N/A;    Social History   Social History  . Marital Status: Single    Spouse Name: N/A  . Number of Children: N/A  . Years of Education: N/A   Occupational History  . Not on file.   Social History Main Topics  . Smoking status: Current Every Day Smoker -- 0.25 packs/day    Types: Cigarettes  . Smokeless tobacco: Never Used  . Alcohol Use: No  . Drug Use: No  . Sexual Activity: Not on file   Other Topics Concern  . Not on file   Social History Narrative    Current Outpatient  Prescriptions on File Prior to Visit  Medication Sig Dispense Refill  . PRESCRIPTION MEDICATION Take 1 tablet by mouth daily. Birth control pills    . valACYclovir (VALTREX) 500 MG tablet Take 1 tablet (500 mg total) by mouth 2 (two) times daily. 6 tablet 0   No current facility-administered medications on file prior to visit.    Allergies  Allergen Reactions  . Hydromorphone Hcl Itching and Rash    Family History  Problem Relation Age of Onset  . Stroke Mother   . Hypertension Mother   . Heart disease Mother   . Kidney disease Mother   . Hyperlipidemia Father   . Diabetes Father     BP 124/76 mmHg  Pulse 89  Temp(Src) 97.7 F (36.5 C) (Oral)  Ht 5\' 5"  (1.651 m)  Wt 136 lb (61.689 kg)  BMI 22.63 kg/m2  SpO2 99%  Review of Systems Denies LOC.      Objective:   Physical Exam VITAL SIGNS:  See vs page GENERAL: no distress Pulses: dorsalis pedis intact bilat.   MSK: no deformity of the feet CV: no leg edema Skin:  no ulcer on the feet.  normal color and temp on the feet. Neuro: sensation is intact to touch on the feet  (  i discussed with Leonia Reader, DM educator) Lab Results  Component Value Date   HGBA1C 13.1 05/22/2015      Assessment & Plan:  Type 1 DM/pregnancy: she urgently needs more aggressive glycemic control.   Patient is advised the following: check your blood sugar 5 times a day: before the 3 meals, after meals on a rotating basis, and at bedtime.  also check if you have symptoms of your blood sugar being too high or too low.  please keep a record of the readings and bring it to your next appointment here.  You can write it on any piece of paper.  please call us sooner if your blood sugar goes below 70, or if you have a lot of readings over 200. Please resume the V-GO-20 pump.  For now, do not use any clicks--just what it gives you without clicking.   I have sent a prescription to your pharmacy, for the insulin for the pump--it is different from what  you take now.  please call us in 2-3 days to tell us how the blood sugar is.  If it is negative, please come back for a follow-up appointment in 1 week.

## 2015-06-10 NOTE — Patient Instructions (Addendum)
check your blood sugar 5 times a day: before the 3 meals, after meals on a rotating basis, and at bedtime.  also check if you have symptoms of your blood sugar being too high or too low.  please keep a record of the readings and bring it to your next appointment here.  You can write it on any piece of paper.  please call us sooner if your blood sugar goes below 70, or if you have a lot of readings over 200. Please resume the V-GO-20 pump.  For now, do not use any clicks--just what it gives you without clicking.   I have sent a prescription to your pharmacy, for the insulin for the pump--it is different from what you take now. We'll check a urine pregnancy test today, but if you do it here, we won't know the result right away.  If it is positive: please call us in 2-3 days to tell us how the blood sugar is.  If it is negative, please come back for a follow-up appointment in 1 week.

## 2015-06-12 ENCOUNTER — Telehealth: Payer: Self-pay | Admitting: Endocrinology

## 2015-06-12 MED ORDER — ACCU-CHEK AVIVA PLUS W/DEVICE KIT: PACK | Status: DC

## 2015-06-12 MED ORDER — GLUCOSE BLOOD VI STRP: ORAL_STRIP | Status: DC

## 2015-06-12 NOTE — Telephone Encounter (Signed)
Pt states the meter and the test strips are not covered by her insurance anymore, the pharmacy did not tell her what was going to be covered and she is not going try to find out. Asked pt what the pharmacy was and it is [facility] on [address], she did not know the phone number. Ann Robertson called [facility] to see if we can get the substitute it is accu check aviva plus

## 2015-06-12 NOTE — Telephone Encounter (Signed)
Rx submitted for accu-chek aviva plus test strips, lancets and meter.

## 2015-06-16 ENCOUNTER — Telehealth: Payer: Self-pay | Admitting: Endocrinology

## 2015-06-16 NOTE — Telephone Encounter (Signed)
Pt needs sterile pads for the v go to not bother her skin please call into [facility], may need to see if [employer] will cover them

## 2015-06-16 NOTE — Telephone Encounter (Signed)
rx was sent on 06/10/15.  i have resent today.

## 2015-06-16 NOTE — Telephone Encounter (Signed)
See note below and please advise what should be ordered.  Thanks!

## 2015-06-17 NOTE — Telephone Encounter (Signed)
Noted  

## 2015-06-18 ENCOUNTER — Ambulatory Visit: Payer: Medicaid Other | Admitting: Endocrinology

## 2015-06-23 ENCOUNTER — Telehealth: Payer: Self-pay | Admitting: *Deleted

## 2015-06-23 DIAGNOSIS — Z3201 Encounter for pregnancy test, result positive: Secondary | ICD-10-CM

## 2015-06-23 NOTE — Telephone Encounter (Signed)
Pt pregnant with uncertain dates. Ultrasound scheduled for Friday Jan 27 at 9:45. Left patient message informing her of ultrasound appointment.

## 2015-06-27 ENCOUNTER — Ambulatory Visit (HOSPITAL_COMMUNITY)
Admission: RE | Admit: 2015-06-27 | Discharge: 2015-06-27 | Disposition: A | Discharge status: Home / Self Care | Payer: Medicaid Other | Source: Ambulatory Visit | Attending: Family Medicine | Admitting: Family Medicine

## 2015-06-27 ENCOUNTER — Encounter (HOSPITAL_COMMUNITY): Payer: Self-pay

## 2015-06-27 DIAGNOSIS — O3481 Maternal care for other abnormalities of pelvic organs, first trimester: Secondary | ICD-10-CM | POA: Insufficient documentation

## 2015-06-27 DIAGNOSIS — Z3201 Encounter for pregnancy test, result positive: Secondary | ICD-10-CM

## 2015-06-27 DIAGNOSIS — E119 Type 2 diabetes mellitus without complications: Secondary | ICD-10-CM | POA: Insufficient documentation

## 2015-06-27 DIAGNOSIS — N8311 Corpus luteum cyst of right ovary: Secondary | ICD-10-CM | POA: Insufficient documentation

## 2015-06-27 DIAGNOSIS — Z3A10 10 weeks gestation of pregnancy: Secondary | ICD-10-CM | POA: Diagnosis not present

## 2015-07-14 ENCOUNTER — Encounter: Payer: Self-pay | Admitting: Obstetrics and Gynecology

## 2015-07-14 ENCOUNTER — Ambulatory Visit (INDEPENDENT_AMBULATORY_CARE_PROVIDER_SITE_OTHER): Payer: Medicaid Other | Admitting: Obstetrics and Gynecology

## 2015-07-14 VITALS — BP 129/78 | HR 89 | Wt 140.9 lb

## 2015-07-14 DIAGNOSIS — O0992 Supervision of high risk pregnancy, unspecified, second trimester: Secondary | ICD-10-CM

## 2015-07-14 DIAGNOSIS — O09899 Supervision of other high risk pregnancies, unspecified trimester: Secondary | ICD-10-CM | POA: Insufficient documentation

## 2015-07-14 DIAGNOSIS — O0990 Supervision of high risk pregnancy, unspecified, unspecified trimester: Secondary | ICD-10-CM | POA: Insufficient documentation

## 2015-07-14 DIAGNOSIS — O09292 Supervision of pregnancy with other poor reproductive or obstetric history, second trimester: Secondary | ICD-10-CM

## 2015-07-14 DIAGNOSIS — O09219 Supervision of pregnancy with history of pre-term labor, unspecified trimester: Secondary | ICD-10-CM

## 2015-07-14 DIAGNOSIS — O09299 Supervision of pregnancy with other poor reproductive or obstetric history, unspecified trimester: Secondary | ICD-10-CM | POA: Insufficient documentation

## 2015-07-14 DIAGNOSIS — O24019 Pre-existing diabetes mellitus, type 1, in pregnancy, unspecified trimester: Secondary | ICD-10-CM

## 2015-07-14 DIAGNOSIS — O09892 Supervision of other high risk pregnancies, second trimester: Secondary | ICD-10-CM

## 2015-07-14 DIAGNOSIS — O24012 Pre-existing diabetes mellitus, type 1, in pregnancy, second trimester: Secondary | ICD-10-CM | POA: Diagnosis not present

## 2015-07-14 DIAGNOSIS — O09212 Supervision of pregnancy with history of pre-term labor, second trimester: Secondary | ICD-10-CM

## 2015-07-14 DIAGNOSIS — O34219 Maternal care for unspecified type scar from previous cesarean delivery: Secondary | ICD-10-CM

## 2015-07-14 HISTORY — DX: Supervision of other high risk pregnancies, unspecified trimester: O09.899

## 2015-07-14 HISTORY — DX: Supervision of pregnancy with other poor reproductive or obstetric history, unspecified trimester: O09.299

## 2015-07-14 MED ORDER — ASPIRIN EC 81 MG PO TBEC: 81.0000 mg | DELAYED_RELEASE_TABLET | Freq: Every day | ORAL | Status: DC

## 2015-07-14 NOTE — Progress Notes (Addendum)
Pain- ligament area, lower back New ob packet given  Pt reports requesting continuing to see her Endocrinologist, Dr. Loanne Drilling, for management of diabetes.  She has refused to see Izora Gala, Diabetes Educator today due to her wanting to continue care with Dr.Ellison.  Pt states that she has taken herself off the pump and started injecting herself with insulin with providers instruction.  Pt stated that she had received labs from [facility] but unsure what.  Contacted [facility], prenatal labs were drawn.  Faxed request for prenatal labs to [facility].   [facility] application faxed.   BTS Consent signed  First Trimester Screen scheduled for 07/21/15 @ 1100

## 2015-07-14 NOTE — Progress Notes (Signed)
First trimester screen scheduled for 07/21/2015 @ 11:00AM Optho referral faxed to [facility], will call to schedule appointment.

## 2015-07-14 NOTE — Progress Notes (Signed)
Home Medicaid Form Completed  

## 2015-07-14 NOTE — Patient Instructions (Signed)
Second Trimester of Pregnancy The second trimester is from week 13 through week 28, months 4 through 6. The second trimester is often a time when you feel your best. Your body has also adjusted to being pregnant, and you begin to feel better physically. Usually, morning sickness has lessened or quit completely, you may have more energy, and you may have an increase in appetite. The second trimester is also a time when the fetus is growing rapidly. At the end of the sixth month, the fetus is about 9 inches long and weighs about 1 pounds. You will likely begin to feel the baby move (quickening) between 18 and 20 weeks of the pregnancy. BODY CHANGES Your body goes through many changes during pregnancy. The changes vary from woman to woman.   Your weight will continue to increase. You will notice your lower abdomen bulging out.  You may begin to get stretch marks on your hips, abdomen, and breasts.  You may develop headaches that can be relieved by medicines approved by your health care provider.  You may urinate more often because the fetus is pressing on your bladder.  You may develop or continue to have heartburn as a result of your pregnancy.  You may develop constipation because certain hormones are causing the muscles that push waste through your intestines to slow down.  You may develop hemorrhoids or swollen, bulging veins (varicose veins).  You may have back pain because of the weight gain and pregnancy hormones relaxing your joints between the bones in your pelvis and as a result of a shift in weight and the muscles that support your balance.  Your breasts will continue to grow and be tender.  Your gums may bleed and may be sensitive to brushing and flossing.  Dark spots or blotches (chloasma, mask of pregnancy) may develop on your face. This will likely fade after the baby is born.  A dark line from your belly button to the pubic area (linea nigra) may appear. This will likely  fade after the baby is born.  You may have changes in your hair. These can include thickening of your hair, rapid growth, and changes in texture. Some women also have hair loss during or after pregnancy, or hair that feels dry or thin. Your hair will most likely return to normal after your baby is born. WHAT TO EXPECT AT YOUR PRENATAL VISITS During a routine prenatal visit:  You will be weighed to make sure you and the fetus are growing normally.  Your blood pressure will be taken.  Your abdomen will be measured to track your baby's growth.  The fetal heartbeat will be listened to.  Any test results from the previous visit will be discussed. Your health care provider may ask you:  How you are feeling.  If you are feeling the baby move.  If you have had any abnormal symptoms, such as leaking fluid, bleeding, severe headaches, or abdominal cramping.  If you are using any tobacco products, including cigarettes, chewing tobacco, and electronic cigarettes.  If you have any questions. Other tests that may be performed during your second trimester include:  Blood tests that check for:  Low iron levels (anemia).  Gestational diabetes (between 24 and 28 weeks).  Rh antibodies.  Urine tests to check for infections, diabetes, or protein in the urine.  An ultrasound to confirm the proper growth and development of the baby.  An amniocentesis to check for possible genetic problems.  Fetal screens for spina bifida  and Down syndrome.  HIV (human immunodeficiency virus) testing. Routine prenatal testing includes screening for HIV, unless you choose not to have this test. HOME CARE INSTRUCTIONS   Avoid all smoking, herbs, alcohol, and unprescribed drugs. These chemicals affect the formation and growth of the baby.  Do not use any tobacco products, including cigarettes, chewing tobacco, and electronic cigarettes. If you need help quitting, ask your health care provider. You may receive  counseling support and other resources to help you quit.  Follow your health care provider's instructions regarding medicine use. There are medicines that are either safe or unsafe to take during pregnancy.  Exercise only as directed by your health care provider. Experiencing uterine cramps is a good sign to stop exercising.  Continue to eat regular, healthy meals.  Wear a good support bra for breast tenderness.  Do not use hot tubs, steam rooms, or saunas.  Wear your seat belt at all times when driving.  Avoid raw meat, uncooked cheese, cat litter boxes, and soil used by cats. These carry germs that can cause birth defects in the baby.  Take your prenatal vitamins.  Take 1500-2000 mg of calcium daily starting at the 20th week of pregnancy until you deliver your baby.  Try taking a stool softener (if your health care provider approves) if you develop constipation. Eat more high-fiber foods, such as fresh vegetables or fruit and whole grains. Drink plenty of fluids to keep your urine clear or pale yellow.  Take warm sitz baths to soothe any pain or discomfort caused by hemorrhoids. Use hemorrhoid cream if your health care provider approves.  If you develop varicose veins, wear support hose. Elevate your feet for 15 minutes, 3-4 times a day. Limit salt in your diet.  Avoid heavy lifting, wear low heel shoes, and practice good posture.  Rest with your legs elevated if you have leg cramps or low back pain.  Visit your dentist if you have not gone yet during your pregnancy. Use a soft toothbrush to brush your teeth and be gentle when you floss.  A sexual relationship may be continued unless your health care provider directs you otherwise.  Continue to go to all your prenatal visits as directed by your health care provider. SEEK MEDICAL CARE IF:   You have dizziness.  You have mild pelvic cramps, pelvic pressure, or nagging pain in the abdominal area.  You have persistent nausea,  vomiting, or diarrhea.  You have a bad smelling vaginal discharge.  You have pain with urination. SEEK IMMEDIATE MEDICAL CARE IF:   You have a fever.  You are leaking fluid from your vagina.  You have spotting or bleeding from your vagina.  You have severe abdominal cramping or pain.  You have rapid weight gain or loss.  You have shortness of breath with chest pain.  You notice sudden or extreme swelling of your face, hands, ankles, feet, or legs.  You have not felt your baby move in over an hour.  You have severe headaches that do not go away with medicine.  You have vision changes.   This information is not intended to replace advice given to you by your health care provider. Make sure you discuss any questions you have with your health care provider.   Document Released: 05/11/2001 Document Revised: 06/07/2014 Document Reviewed: 07/18/2012 Elsevier Interactive Patient Education [employer].  Contraception Choices Contraception (birth control) is the use of any methods or devices to prevent pregnancy. Below are some methods to  help avoid pregnancy. HORMONAL METHODS   Contraceptive implant. This is a thin, plastic tube containing progesterone hormone. It does not contain estrogen hormone. Your health care provider inserts the tube in the inner part of the upper arm. The tube can remain in place for up to 3 years. After 3 years, the implant must be removed. The implant prevents the ovaries from releasing an egg (ovulation), thickens the cervical mucus to prevent sperm from entering the uterus, and thins the lining of the inside of the uterus.  Progesterone-only injections. These injections are given every 3 months by your health care provider to prevent pregnancy. This synthetic progesterone hormone stops the ovaries from releasing eggs. It also thickens cervical mucus and changes the uterine lining. This makes it harder for sperm to survive in the uterus.  Birth  control pills. These pills contain estrogen and progesterone hormone. They work by preventing the ovaries from releasing eggs (ovulation). They also cause the cervical mucus to thicken, preventing the sperm from entering the uterus. Birth control pills are prescribed by a health care provider.Birth control pills can also be used to treat heavy periods.  Minipill. This type of birth control pill contains only the progesterone hormone. They are taken every day of each month and must be prescribed by your health care provider.  Birth control patch. The patch contains hormones similar to those in birth control pills. It must be changed once a week and is prescribed by a health care provider.  Vaginal ring. The ring contains hormones similar to those in birth control pills. It is left in the vagina for 3 weeks, removed for 1 week, and then a new one is put back in place. The patient must be comfortable inserting and removing the ring from the vagina.A health care provider's prescription is necessary.  Emergency contraception. Emergency contraceptives prevent pregnancy after unprotected sexual intercourse. This pill can be taken right after sex or up to 5 days after unprotected sex. It is most effective the sooner you take the pills after having sexual intercourse. Most emergency contraceptive pills are available without a prescription. Check with your pharmacist. Do not use emergency contraception as your only form of birth control. BARRIER METHODS   Female condom. This is a thin sheath (latex or rubber) that is worn over the penis during sexual intercourse. It can be used with spermicide to increase effectiveness.  Female condom. This is a soft, loose-fitting sheath that is put into the vagina before sexual intercourse.  Diaphragm. This is a soft, latex, dome-shaped barrier that must be fitted by a health care provider. It is inserted into the vagina, along with a spermicidal jelly. It is inserted before  intercourse. The diaphragm should be left in the vagina for 6 to 8 hours after intercourse.  Cervical cap. This is a round, soft, latex or plastic cup that fits over the cervix and must be fitted by a health care provider. The cap can be left in place for up to 48 hours after intercourse.  Sponge. This is a soft, circular piece of polyurethane foam. The sponge has spermicide in it. It is inserted into the vagina after wetting it and before sexual intercourse.  Spermicides. These are chemicals that kill or block sperm from entering the cervix and uterus. They come in the form of creams, jellies, suppositories, foam, or tablets. They do not require a prescription. They are inserted into the vagina with an applicator before having sexual intercourse. The process must be repeated every  time you have sexual intercourse. INTRAUTERINE CONTRACEPTION  Intrauterine device (IUD). This is a T-shaped device that is put in a woman's uterus during a menstrual period to prevent pregnancy. There are 2 types:  Copper IUD. This type of IUD is wrapped in copper wire and is placed inside the uterus. Copper makes the uterus and fallopian tubes produce a fluid that kills sperm. It can stay in place for 10 years.  Hormone IUD. This type of IUD contains the hormone progestin (synthetic progesterone). The hormone thickens the cervical mucus and prevents sperm from entering the uterus, and it also thins the uterine lining to prevent implantation of a fertilized egg. The hormone can weaken or kill the sperm that get into the uterus. It can stay in place for 3-5 years, depending on which type of IUD is used. PERMANENT METHODS OF CONTRACEPTION  Female tubal ligation. This is when the woman's fallopian tubes are surgically sealed, tied, or blocked to prevent the egg from traveling to the uterus.  Hysteroscopic sterilization. This involves placing a small coil or insert into each fallopian tube. Your doctor uses a technique  called hysteroscopy to do the procedure. The device causes scar tissue to form. This results in permanent blockage of the fallopian tubes, so the sperm cannot fertilize the egg. It takes about 3 months after the procedure for the tubes to become blocked. You must use another form of birth control for these 3 months.  Female sterilization. This is when the female has the tubes that carry sperm tied off (vasectomy).This blocks sperm from entering the vagina during sexual intercourse. After the procedure, the man can still ejaculate fluid (semen). NATURAL PLANNING METHODS  Natural family planning. This is not having sexual intercourse or using a barrier method (condom, diaphragm, cervical cap) on days the woman could become pregnant.  Calendar method. This is keeping track of the length of each menstrual cycle and identifying when you are fertile.  Ovulation method. This is avoiding sexual intercourse during ovulation.  Symptothermal method. This is avoiding sexual intercourse during ovulation, using a thermometer and ovulation symptoms.  Post-ovulation method. This is timing sexual intercourse after you have ovulated. Regardless of which type or method of contraception you choose, it is important that you use condoms to protect against the transmission of sexually transmitted infections (STIs). Talk with your health care provider about which form of contraception is most appropriate for you.   This information is not intended to replace advice given to you by your health care provider. Make sure you discuss any questions you have with your health care provider.   Document Released: 05/17/2005 Document Revised: 05/22/2013 Document Reviewed: 11/09/2012 Elsevier Interactive Patient Education [employer].  Breastfeeding Deciding to breastfeed is one of the best choices you can make for you and your baby. A change in hormones during pregnancy causes your breast tissue to grow and increases the  number and size of your milk ducts. These hormones also allow proteins, sugars, and fats from your blood supply to make breast milk in your milk-producing glands. Hormones prevent breast milk from being released before your baby is born as well as prompt milk flow after birth. Once breastfeeding has begun, thoughts of your baby, as well as his or her sucking or crying, can stimulate the release of milk from your milk-producing glands.  BENEFITS OF BREASTFEEDING For Your Baby  Your first milk (colostrum) helps your baby's digestive system function better.  There are antibodies in your milk that help  your baby fight off infections.  Your baby has a lower incidence of asthma, allergies, and sudden infant death syndrome.  The nutrients in breast milk are better for your baby than infant formulas and are designed uniquely for your baby's needs.  Breast milk improves your baby's brain development.  Your baby is less likely to develop other conditions, such as childhood obesity, asthma, or type 2 diabetes mellitus. For You  Breastfeeding helps to create a very special bond between you and your baby.  Breastfeeding is convenient. Breast milk is always available at the correct temperature and costs nothing.  Breastfeeding helps to burn calories and helps you lose the weight gained during pregnancy.  Breastfeeding makes your uterus contract to its prepregnancy size faster and slows bleeding (lochia) after you give birth.   Breastfeeding helps to lower your risk of developing type 2 diabetes mellitus, osteoporosis, and breast or ovarian cancer later in life. SIGNS THAT YOUR BABY IS HUNGRY Early Signs of Hunger  Increased alertness or activity.  Stretching.  Movement of the head from side to side.  Movement of the head and opening of the mouth when the corner of the mouth or cheek is stroked (rooting).  Increased sucking sounds, smacking lips, cooing, sighing, or squeaking.  Hand-to-mouth  movements.  Increased sucking of fingers or hands. Late Signs of Hunger  Fussing.  Intermittent crying. Extreme Signs of Hunger Signs of extreme hunger will require calming and consoling before your baby will be able to breastfeed successfully. Do not wait for the following signs of extreme hunger to occur before you initiate breastfeeding:  Restlessness.  A loud, strong cry.  Screaming. BREASTFEEDING BASICS Breastfeeding Initiation  Find a comfortable place to sit or lie down, with your neck and back well supported.  Place a pillow or rolled up blanket under your baby to bring him or her to the level of your breast (if you are seated). Nursing pillows are specially designed to help support your arms and your baby while you breastfeed.  Make sure that your baby's abdomen is facing your abdomen.  Gently massage your breast. With your fingertips, massage from your chest wall toward your nipple in a circular motion. This encourages milk flow. You may need to continue this action during the feeding if your milk flows slowly.  Support your breast with 4 fingers underneath and your thumb above your nipple. Make sure your fingers are well away from your nipple and your baby's mouth.  Stroke your baby's lips gently with your finger or nipple.  When your baby's mouth is open wide enough, quickly bring your baby to your breast, placing your entire nipple and as much of the colored area around your nipple (areola) as possible into your baby's mouth.  More areola should be visible above your baby's upper lip than below the lower lip.  Your baby's tongue should be between his or her lower gum and your breast.  Ensure that your baby's mouth is correctly positioned around your nipple (latched). Your baby's lips should create a seal on your breast and be turned out (everted).  It is common for your baby to suck about 2-3 minutes in order to start the flow of breast milk. Latching Teaching  your baby how to latch on to your breast properly is very important. An improper latch can cause nipple pain and decreased milk supply for you and poor weight gain in your baby. Also, if your baby is not latched onto your nipple properly, he  or she may swallow some air during feeding. This can make your baby fussy. Burping your baby when you switch breasts during the feeding can help to get rid of the air. However, teaching your baby to latch on properly is still the best way to prevent fussiness from swallowing air while breastfeeding. Signs that your baby has successfully latched on to your nipple:  Silent tugging or silent sucking, without causing you pain.  Swallowing heard between every 3-4 sucks.  Muscle movement above and in front of his or her ears while sucking. Signs that your baby has not successfully latched on to nipple:  Sucking sounds or smacking sounds from your baby while breastfeeding.  Nipple pain. If you think your baby has not latched on correctly, slip your finger into the corner of your baby's mouth to break the suction and place it between your baby's gums. Attempt breastfeeding initiation again. Signs of Successful Breastfeeding Signs from your baby:  A gradual decrease in the number of sucks or complete cessation of sucking.  Falling asleep.  Relaxation of his or her body.  Retention of a small amount of milk in his or her mouth.  Letting go of your breast by himself or herself. Signs from you:  Breasts that have increased in firmness, weight, and size 1-3 hours after feeding.  Breasts that are softer immediately after breastfeeding.  Increased milk volume, as well as a change in milk consistency and color by the fifth day of breastfeeding.  Nipples that are not sore, cracked, or bleeding. Signs That Your [employer] Books is Getting Enough Milk  Wetting at least 3 diapers in a 24-hour period. The urine should be clear and pale yellow by age 677 days.  At least 3  stools in a 24-hour period by age 677 days. The stool should be soft and yellow.  At least 3 stools in a 24-hour period by age 16 days. The stool should be seedy and yellow.  No loss of weight greater than 10% of birth weight during the first 15 days of age.  Average weight gain of 4-7 ounces (113-198 g) per week after age 59 days.  Consistent daily weight gain by age 38 days, without weight loss after the age of 2 weeks. After a feeding, your baby may spit up a small amount. This is common. BREASTFEEDING FREQUENCY AND DURATION Frequent feeding will help you make more milk and can prevent sore nipples and breast engorgement. Breastfeed when you feel the need to reduce the fullness of your breasts or when your baby shows signs of hunger. This is called "breastfeeding on demand." Avoid introducing a pacifier to your baby while you are working to establish breastfeeding (the first 4-6 weeks after your baby is born). After this time you may choose to use a pacifier. Research has shown that pacifier use during the first year of a baby's life decreases the risk of sudden infant death syndrome (SIDS). Allow your baby to feed on each breast as long as he or she wants. Breastfeed until your baby is finished feeding. When your baby unlatches or falls asleep while feeding from the first breast, offer the second breast. Because newborns are often sleepy in the first few weeks of life, you may need to awaken your baby to get him or her to feed. Breastfeeding times will vary from baby to baby. However, the following rules can serve as a guide to help you ensure that your baby is properly fed:  Newborns (babies 4 weeks  of age or younger) may breastfeed every 1-3 hours.  Newborns should not go longer than 3 hours during the day or 5 hours during the night without breastfeeding.  You should breastfeed your baby a minimum of 8 times in a 24-hour period until you begin to introduce solid foods to your baby at around 67  months of age. BREAST MILK PUMPING Pumping and storing breast milk allows you to ensure that your baby is exclusively fed your breast milk, even at times when you are unable to breastfeed. This is especially important if you are going back to work while you are still breastfeeding or when you are not able to be present during feedings. Your lactation consultant can give you guidelines on how long it is safe to store breast milk. A breast pump is a machine that allows you to pump milk from your breast into a sterile bottle. The pumped breast milk can then be stored in a refrigerator or freezer. Some breast pumps are operated by hand, while others use electricity. Ask your lactation consultant which type will work best for you. Breast pumps can be purchased, but some hospitals and breastfeeding support groups lease breast pumps on a monthly basis. A lactation consultant can teach you how to hand express breast milk, if you prefer not to use a pump. CARING FOR YOUR BREASTS WHILE YOU BREASTFEED Nipples can become dry, cracked, and sore while breastfeeding. The following recommendations can help keep your breasts moisturized and healthy:  Avoid using soap on your nipples.  Wear a supportive bra. Although not required, special nursing bras and tank tops are designed to allow access to your breasts for breastfeeding without taking off your entire bra or top. Avoid wearing underwire-style bras or extremely tight bras.  Air dry your nipples for 3-79minutes after each feeding.  Use only cotton bra pads to absorb leaked breast milk. Leaking of breast milk between feedings is normal.  Use lanolin on your nipples after breastfeeding. Lanolin helps to maintain your skin's normal moisture barrier. If you use pure lanolin, you do not need to wash it off before feeding your baby again. Pure lanolin is not toxic to your baby. You may also hand express a few drops of breast milk and gently massage that milk into your  nipples and allow the milk to air dry. In the first few weeks after giving birth, some women experience extremely full breasts (engorgement). Engorgement can make your breasts feel heavy, warm, and tender to the touch. Engorgement peaks within 3-5 days after you give birth. The following recommendations can help ease engorgement:  Completely empty your breasts while breastfeeding or pumping. You may want to start by applying warm, moist heat (in the shower or with warm water-soaked hand towels) just before feeding or pumping. This increases circulation and helps the milk flow. If your baby does not completely empty your breasts while breastfeeding, pump any extra milk after he or she is finished.  Wear a snug bra (nursing or regular) or tank top for 1-2 days to signal your body to slightly decrease milk production.  Apply ice packs to your breasts, unless this is too uncomfortable for you.  Make sure that your baby is latched on and positioned properly while breastfeeding. If engorgement persists after 48 hours of following these recommendations, contact your health care provider or a Science writer. OVERALL HEALTH CARE RECOMMENDATIONS WHILE BREASTFEEDING  Eat healthy foods. Alternate between meals and snacks, eating 3 of each per day. Because what  you eat affects your breast milk, some of the foods may make your baby more irritable than usual. Avoid eating these foods if you are sure that they are negatively affecting your baby.  Drink milk, fruit juice, and water to satisfy your thirst (about 10 glasses a day).  Rest often, relax, and continue to take your prenatal vitamins to prevent fatigue, stress, and anemia.  Continue breast self-awareness checks.  Avoid chewing and smoking tobacco. Chemicals from cigarettes that pass into breast milk and exposure to secondhand smoke may harm your baby.  Avoid alcohol and drug use, including marijuana. Some medicines that may be harmful to your  baby can pass through breast milk. It is important to ask your health care provider before taking any medicine, including all over-the-counter and prescription medicine as well as vitamin and herbal supplements. It is possible to become pregnant while breastfeeding. If birth control is desired, ask your health care provider about options that will be safe for your baby. SEEK MEDICAL CARE IF:  You feel like you want to stop breastfeeding or have become frustrated with breastfeeding.  You have painful breasts or nipples.  Your nipples are cracked or bleeding.  Your breasts are red, tender, or warm.  You have a swollen area on either breast.  You have a fever or chills.  You have nausea or vomiting.  You have drainage other than breast milk from your nipples.  Your breasts do not become full before feedings by the fifth day after you give birth.  You feel sad and depressed.  Your baby is too sleepy to eat well.  Your baby is having trouble sleeping.   Your baby is wetting less than 3 diapers in a 24-hour period.  Your baby has less than 3 stools in a 24-hour period.  Your baby's skin or the white part of his or her eyes becomes yellow.   Your baby is not gaining weight by 37 days of age. SEEK IMMEDIATE MEDICAL CARE IF:  Your baby is overly tired (lethargic) and does not want to wake up and feed.  Your baby develops an unexplained fever.   This information is not intended to replace advice given to you by your health care provider. Make sure you discuss any questions you have with your health care provider.   Document Released: 05/17/2005 Document Revised: 02/05/2015 Document Reviewed: 11/08/2012 Elsevier Interactive Patient Education [employer].

## 2015-07-14 NOTE — Progress Notes (Signed)
Subjective:    Ann Robertson is a [id number] [redacted]w[redacted]d being seen today for her first obstetrical visit.  Her obstetrical history is significant for type I DM class F self- discontinued her insulin pump, h/o pre-eclampsia, previous cesarean section x 3, h/o preterm labor. Patient does intend to breast feed. Pregnancy history fully reviewed.  Patient reports no complaints.  Filed Vitals:   07/14/15 0909  BP: 129/78  Pulse: 89  Weight: 140 lb 14.4 oz (63.912 kg)    HISTORY: OB History  Gravida Para Term Preterm AB SAB TAB Ectopic Multiple Living  5 3 0 3 1 1 0 0 0 3     # Outcome Date GA Lbr Len/2nd Weight Sex Delivery Anes PTL Lv  5 Current           4 Preterm 07/25/14 [redacted]w[redacted]d  7 lb 3.7 oz (3.28 kg) F CS-LTranv Spinal  Y  3 Preterm 07/05/13 [redacted]w[redacted]d  6 lb 7.9 oz (2.945 kg) M CS-Vac Spinal  Y  2 SAB 2014          1 Preterm 04/17/06 [redacted]w[redacted]d 14:00 5 lb 14 oz (2.665 kg) M CS-Unspec Spinal Y Y     Comments: failure to progess; type 1 diabetes     Past Medical History  Diagnosis Date  . Diabetes mellitus   . Juvenile diabetes mellitus   . Juvenile diabetes 2000  . Neuropathy ([place])   . High cholesterol   . HSV-2 infection   . STD (sexually transmitted disease)    Past Surgical History  Procedure Laterality Date  . Cesarean section    . Wisdom tooth extraction    . Cesarean section N/A 07/05/2013    Procedure: CESAREAN SECTION;  Surgeon: Emily Filbert, MD;  Location: [facility];  Service: Obstetrics;  Laterality: N/A;  . Cesarean section N/A 07/25/2014    Procedure: CESAREAN SECTION;  Surgeon: Donnamae Jude, MD;  Location: [facility];  Service: Obstetrics;  Laterality: N/A;   Family History  Problem Relation Age of Onset  . Stroke Mother   . Hypertension Mother   . Heart disease Mother   . Kidney disease Mother   . Hyperlipidemia Father   . Diabetes Father      Exam    Uterus:     Pelvic Exam:    Perineum: Normal Perineum   Vulva: normal   Vagina:  normal mucosa, normal discharge   pH:    Cervix: Closed and long   Adnexa: normal adnexa and no mass, fullness, tenderness   Bony Pelvis: gynecoid  System: Breast:  normal appearance, no masses or tenderness   Skin: normal coloration and turgor, no rashes    Neurologic: oriented, no focal deficits   Extremities: normal strength, tone, and muscle mass   HEENT extra ocular movement intact   Mouth/Teeth mucous membranes moist, pharynx normal without lesions and dental hygiene good   Neck supple and no masses   Cardiovascular: regular rate and rhythm   Respiratory:  chest clear, no wheezing, crepitations, rhonchi, normal symmetric air entry   Abdomen: soft, non-tender; bowel sounds normal; no masses,  no organomegaly   Urinary:       Assessment:    Pregnancy: [id number] Patient Active Problem List   Diagnosis Date Noted  . Type 1 diabetes mellitus complicating pregnancy, antepartum 07/14/2015    Priority: Medium  . Previous cesarean section complicating pregnancy [id number]    Priority: Medium  . Short interval between pregnancies complicating pregnancy, antepartum 07/14/2015  Priority: Medium  . History of preterm delivery, currently pregnant 07/14/2015    Priority: Medium  . Supervision of high risk pregnancy, antepartum 07/14/2015    Priority: Medium  . History of pre-eclampsia in prior pregnancy, currently pregnant 07/14/2015  . Amenorrhea 05/22/2015  . History of preterm delivery 09/06/2014  . Previous cesarean delivery, delivered 09/06/2014  . History of polyhydramnios 09/06/2014  . Postpartum care following cesarean delivery 07/25/2014  . Pre-eclampsia, severe, delivered 07/23/2014  . 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 Prenatal vitamins. Problem list reviewed and updated. Genetic Screening discussed First Screen: ordered.  Ultrasound discussed;  fetal survey: requested. Patient self discontinued pump. She reports CBGs in 400-500 range. She declined nutrition and diabetic counseling. She desires her DM to be managed by her endocrinologist Dr. Loanne Drilling. She is aware that these values are not adequate even outside of pregnancy Patient has had prenatal labs done at [facility]- will obtain records 24 hr urine protein ordered Ophthalmology referral provided Patient is contemplating BTL  Follow up in 3 weeks. 50% of 30 min visit spent on counseling and coordination of care.     Ann Robertson 07/14/2015

## 2015-07-15 ENCOUNTER — Telehealth: Payer: Self-pay | Admitting: Endocrinology

## 2015-07-15 MED ORDER — INSULIN ASPART 100 UNIT/ML ~~LOC~~ SOLN: SUBCUTANEOUS | Status: DC

## 2015-07-15 NOTE — Telephone Encounter (Signed)
Patient need a refill of Novalog, send to  [facility] [place], [place]. [phone] (Phone) [phone] (Fax)

## 2015-07-15 NOTE — Telephone Encounter (Signed)
Rx submitted per pt's request.  

## 2015-07-18 ENCOUNTER — Encounter: Payer: Self-pay | Admitting: *Deleted

## 2015-07-21 ENCOUNTER — Encounter (HOSPITAL_COMMUNITY): Payer: Self-pay

## 2015-07-21 ENCOUNTER — Ambulatory Visit (HOSPITAL_COMMUNITY)
Admission: RE | Admit: 2015-07-21 | Discharge: 2015-07-21 | Disposition: A | Discharge status: Home / Self Care | Payer: Medicaid Other | Source: Ambulatory Visit | Attending: Obstetrics and Gynecology | Admitting: Obstetrics and Gynecology

## 2015-07-21 VITALS — BP 132/89 | HR 95 | Wt 142.6 lb

## 2015-07-21 DIAGNOSIS — Z3A13 13 weeks gestation of pregnancy: Secondary | ICD-10-CM | POA: Insufficient documentation

## 2015-07-21 DIAGNOSIS — O09291 Supervision of pregnancy with other poor reproductive or obstetric history, first trimester: Secondary | ICD-10-CM | POA: Diagnosis present

## 2015-07-21 DIAGNOSIS — O0992 Supervision of high risk pregnancy, unspecified, second trimester: Secondary | ICD-10-CM

## 2015-07-21 DIAGNOSIS — O99331 Smoking (tobacco) complicating pregnancy, first trimester: Secondary | ICD-10-CM | POA: Diagnosis not present

## 2015-07-21 DIAGNOSIS — O24011 Pre-existing diabetes mellitus, type 1, in pregnancy, first trimester: Secondary | ICD-10-CM | POA: Diagnosis not present

## 2015-07-21 DIAGNOSIS — O24012 Pre-existing diabetes mellitus, type 1, in pregnancy, second trimester: Secondary | ICD-10-CM

## 2015-07-21 DIAGNOSIS — O09211 Supervision of pregnancy with history of pre-term labor, first trimester: Secondary | ICD-10-CM | POA: Diagnosis not present

## 2015-07-21 NOTE — ED Notes (Signed)
Pt left before being seen by MFM doctor.  No first trimester screen drawn.

## 2015-07-23 ENCOUNTER — Ambulatory Visit: Payer: Medicaid Other | Admitting: Endocrinology

## 2015-07-23 ENCOUNTER — Telehealth: Payer: Self-pay | Admitting: Endocrinology

## 2015-07-23 NOTE — Telephone Encounter (Signed)
I contacted the pt. She stated she did not have any numbers to report me because she has note been checking her blood sugar readings. Pt was advised to begin taking her blood sugar readings and bring the readings in on 07/26/2015 for Dr. Loanne Drilling to review.

## 2015-07-23 NOTE — Telephone Encounter (Signed)
Pt called and had to reschedule her appt for today because of car trouble but did want to inform you of what has been going on with her sugar levels. CB [id number]

## 2015-08-04 ENCOUNTER — Encounter: Payer: Medicaid Other | Admitting: Family Medicine

## 2015-08-04 ENCOUNTER — Encounter: Payer: Self-pay | Admitting: Family Medicine

## 2015-08-05 ENCOUNTER — Telehealth: Payer: Self-pay | Admitting: Endocrinology

## 2015-08-05 ENCOUNTER — Ambulatory Visit: Payer: Medicaid Other | Admitting: Endocrinology

## 2015-08-05 NOTE — Telephone Encounter (Signed)
I contacted the pt and she stated she had just woke up and was not ready to talk about her insulin at this time Pt stated she would call our office back.

## 2015-08-05 NOTE — Telephone Encounter (Signed)
Pt called and cancelled appt due to not having a ride but she does want to talk to you about her medications and that she is no longer using VGo

## 2015-08-11 ENCOUNTER — Telehealth: Payer: Self-pay

## 2015-08-11 DIAGNOSIS — O24012 Pre-existing diabetes mellitus, type 1, in pregnancy, second trimester: Secondary | ICD-10-CM

## 2015-08-11 NOTE — Telephone Encounter (Signed)
Patient calling wanting to schedule an anatomy u/s, stated she missed her last appointment and did not have a ride due to lack of a car. She wants to schedule an u/s but does not want to go to a doctor's appointment.  I told the patient that she needs to come to regular prenatal checkups and an u/s would be scheduled at that time. Patient became angry and asked to speak to Dr Elly Modena. I told her Dr Elly Modena was not in clinic this afternoon. Then she wanted to talk to any doctor. After putting her on hold and speaking to a colleague, it was determined that an u/s could be scheduled along with a prenatal visit beforehand. I told the patient and scheduled her u/s for 3/23@1pm . The patient was not happy with this and stated she was 18 weeks now and would be more than 19 weeks then. I explained to her that she was only [redacted]w[redacted]d. She began arguing with me about the due date and demanded that a doctor call her and discuss her due date. I told her the front desk would call to schedule her visit and she could speak with the doctor at that time. Understanding voiced.

## 2015-08-21 ENCOUNTER — Ambulatory Visit (INDEPENDENT_AMBULATORY_CARE_PROVIDER_SITE_OTHER): Payer: Medicaid Other | Admitting: Advanced Practice Midwife

## 2015-08-21 ENCOUNTER — Ambulatory Visit (HOSPITAL_COMMUNITY)
Admission: RE | Admit: 2015-08-21 | Discharge: 2015-08-21 | Disposition: A | Discharge status: Home / Self Care | Payer: Medicaid Other | Source: Ambulatory Visit | Attending: Family Medicine | Admitting: Family Medicine

## 2015-08-21 ENCOUNTER — Other Ambulatory Visit: Payer: Self-pay | Admitting: Family Medicine

## 2015-08-21 ENCOUNTER — Encounter: Payer: Medicaid Other | Admitting: Advanced Practice Midwife

## 2015-08-21 ENCOUNTER — Encounter (HOSPITAL_COMMUNITY): Payer: Self-pay

## 2015-08-21 VITALS — BP 142/90 | HR 97 | Temp 97.7°F | Wt 141.9 lb

## 2015-08-21 VITALS — BP 129/85 | HR 103 | Wt 144.0 lb

## 2015-08-21 DIAGNOSIS — Z3A18 18 weeks gestation of pregnancy: Secondary | ICD-10-CM

## 2015-08-21 DIAGNOSIS — O09212 Supervision of pregnancy with history of pre-term labor, second trimester: Secondary | ICD-10-CM | POA: Diagnosis not present

## 2015-08-21 DIAGNOSIS — O09292 Supervision of pregnancy with other poor reproductive or obstetric history, second trimester: Secondary | ICD-10-CM

## 2015-08-21 DIAGNOSIS — O34219 Maternal care for unspecified type scar from previous cesarean delivery: Secondary | ICD-10-CM

## 2015-08-21 DIAGNOSIS — O99332 Smoking (tobacco) complicating pregnancy, second trimester: Secondary | ICD-10-CM

## 2015-08-21 DIAGNOSIS — O24012 Pre-existing diabetes mellitus, type 1, in pregnancy, second trimester: Secondary | ICD-10-CM

## 2015-08-21 DIAGNOSIS — O0992 Supervision of high risk pregnancy, unspecified, second trimester: Secondary | ICD-10-CM

## 2015-08-21 DIAGNOSIS — Z36 Encounter for antenatal screening of mother: Secondary | ICD-10-CM | POA: Diagnosis not present

## 2015-08-21 DIAGNOSIS — Z3689 Encounter for other specified antenatal screening: Secondary | ICD-10-CM

## 2015-08-21 NOTE — Progress Notes (Signed)
Not on pump due to low blood sugars. Went back to insulin injections. Unable to tell me her exact values, but knows that they are high (200s). Plans to follow-up with her endocrinologist Dr. Loanne Drilling to adjust pump settings, but has not been able to get to an appointment with him because her car broke down. Lengthy conversation with patient about importance of excellent blood sugar control in pregnancy. Patient unconcerned about fetal macrosomia because she is planning repeat C-section. Explained that there is still risk of damage to mother and baby in C-section delivery due to fetal macrosomia at there is also increased risk of stillbirth and fetal lung immaturity with uncontrolled blood sugars.  Subjective:  Ann Robertson is a 25 y.o. [id number] at [redacted]w[redacted]d being seen today for ongoing prenatal care.  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  Patient reports no complaints.  Contractions: Not present. Vag. Bleeding: None.  Movement: Present. Denies leaking of fluid.   The following portions of the patient's history were reviewed and updated as appropriate: allergies, current medications, past family history, past medical history, past social history, past surgical history and problem list. Problem list updated.  Objective:   Filed Vitals:   08/21/15 1213  BP: 142/90  Pulse: 97  Temp: 97.7 F (36.5 C)   Weight: 141 lb 14.4 oz (64.365 kg)    Fetal Status: Fetal Heart Rate (bpm): 145 Fundal Height: 18 cm Movement: Present     General:  Alert, oriented and cooperative. Patient is in no acute distress.  Skin: Skin is warm and dry. No rash noted.   Cardiovascular: Normal heart rate noted  Respiratory: Normal respiratory effort, no problems with respiration noted  Abdomen: Soft, gravid, appropriate for gestational age. Pain/Pressure: Present     Pelvic: Vag. Bleeding: None     Cervical exam deferred        Extremities: Normal range of motion.  Edema: None  Mental Status: Normal mood and affect. Normal behavior. Normal judgment and thought content.   Urinalysis:     did not leave specimen  Assessment and Plan:  Pregnancy: [id number] at [redacted]w[redacted]d  1. Type 1 diabetes mellitus complicating pregnancy, antepartum, second trimester - Strongly urged patient to follow-up as soon as possible with endocrinologist and informed her that clinic will need to receive blood sugars from her pump.  Preterm labor symptoms and general obstetric precautions including but not limited to vaginal bleeding, contractions, leaking of fluid and fetal movement were reviewed in detail with the patient. Please refer to After Visit Summary for other counseling recommendations.  Return in about 2 weeks (around 09/04/2015) for [place].   Manya Silvas, CNM

## 2015-08-21 NOTE — Patient Instructions (Addendum)

## 2015-08-21 NOTE — Progress Notes (Signed)
Patient c/o constipation, straining at stools

## 2015-08-24 MED ORDER — PRENATAL VITAMINS 0.8 MG PO TABS: 1.0000 | ORAL_TABLET | Freq: Every day | ORAL | Status: DC

## 2015-09-03 ENCOUNTER — Encounter: Payer: Medicaid Other | Admitting: Advanced Practice Midwife

## 2015-09-08 ENCOUNTER — Telehealth: Payer: Self-pay | Admitting: General Practice

## 2015-09-08 NOTE — Telephone Encounter (Signed)
Patient has been schedule for opthalmology appt on 10/02/2015 at 9:15. I called to give the patient the information she stated she would make her own appointment when she is ready to see her provider.  I have canceled the patient appointment at this time.

## 2015-09-08 NOTE — Telephone Encounter (Signed)
Per Manya Silvas patient needs opthalmology appt due to type 1 diabetes in pregnancy. Patient has already had fetal echo. [facility] and left message for them to call us back to set up an appt.

## 2015-09-15 ENCOUNTER — Encounter: Payer: Medicaid Other | Admitting: Obstetrics and Gynecology

## 2015-09-16 ENCOUNTER — Other Ambulatory Visit (HOSPITAL_COMMUNITY): Payer: Self-pay | Admitting: *Deleted

## 2015-09-16 DIAGNOSIS — O24019 Pre-existing diabetes mellitus, type 1, in pregnancy, unspecified trimester: Secondary | ICD-10-CM

## 2015-09-18 ENCOUNTER — Encounter: Payer: Medicaid Other | Admitting: Obstetrics & Gynecology

## 2015-09-18 ENCOUNTER — Ambulatory Visit (HOSPITAL_COMMUNITY): Admission: RE | Admit: 2015-09-18 | Payer: Medicaid Other | Source: Ambulatory Visit

## 2015-09-20 ENCOUNTER — Encounter (HOSPITAL_BASED_OUTPATIENT_CLINIC_OR_DEPARTMENT_OTHER): Payer: Self-pay | Admitting: Emergency Medicine

## 2015-09-20 ENCOUNTER — Emergency Department (HOSPITAL_BASED_OUTPATIENT_CLINIC_OR_DEPARTMENT_OTHER)
Admission: EM | Admit: 2015-09-20 | Discharge: 2015-09-20 | Disposition: A | Discharge status: Home / Self Care | Payer: Medicaid Other | Attending: Emergency Medicine | Admitting: Emergency Medicine

## 2015-09-20 DIAGNOSIS — H6091 Unspecified otitis externa, right ear: Secondary | ICD-10-CM

## 2015-09-20 DIAGNOSIS — Z79899 Other long term (current) drug therapy: Secondary | ICD-10-CM | POA: Insufficient documentation

## 2015-09-20 DIAGNOSIS — Z7982 Long term (current) use of aspirin: Secondary | ICD-10-CM | POA: Diagnosis not present

## 2015-09-20 DIAGNOSIS — Z794 Long term (current) use of insulin: Secondary | ICD-10-CM | POA: Insufficient documentation

## 2015-09-20 DIAGNOSIS — Z7952 Long term (current) use of systemic steroids: Secondary | ICD-10-CM | POA: Diagnosis not present

## 2015-09-20 DIAGNOSIS — Z8619 Personal history of other infectious and parasitic diseases: Secondary | ICD-10-CM | POA: Insufficient documentation

## 2015-09-20 DIAGNOSIS — H9201 Otalgia, right ear: Secondary | ICD-10-CM | POA: Diagnosis present

## 2015-09-20 DIAGNOSIS — E109 Type 1 diabetes mellitus without complications: Secondary | ICD-10-CM | POA: Insufficient documentation

## 2015-09-20 DIAGNOSIS — F17210 Nicotine dependence, cigarettes, uncomplicated: Secondary | ICD-10-CM | POA: Diagnosis not present

## 2015-09-20 MED ORDER — NEOMYCIN-POLYMYXIN-HC 3.5-10000-1 OT SUSP: 4.0000 [drp] | Freq: Three times a day (TID) | OTIC | Status: DC

## 2015-09-20 NOTE — Discharge Instructions (Signed)

## 2015-09-20 NOTE — ED Notes (Signed)
Per patient blood sugar has been wnl, however she doesn't recall cbg from this am. Pt is in nad sitting in triage room with 4 children.

## 2015-09-20 NOTE — ED Notes (Signed)
Pt with right side ear pain x 2 days, no congestion or cough per patient,

## 2015-09-20 NOTE — ED Provider Notes (Signed)
CSN: [id number]     Arrival date & time 09/20/15  1615 History  By signing my name below, I, Rowan Blase, attest that this documentation has been prepared under the direction and in the presence of non-physician practitioner, Okey Regal, PA-C, . Electronically Signed: Rowan Blase, Scribe. 09/20/2015. 5:09 PM.   Chief Complaint  Patient presents with  . Otalgia   The history is provided by the patient. No language interpreter was used.   HPI Comments:  Ann Robertson is a 25 y.o. female with PMHx of DM who presents to the Emergency Department complaining of stabbing right ear pain and pressure for the past 2-3 days. Pt reports associated pain when pulling down on ear. She has treated with hydrogen peroxide without relief. She notes pain feels similar to past ear infections. Pt is currently 5 months pregnant. Denies fever, congestion, rhinorrhea or sore throat.   G5 P0 313 at approximately 22 weeks. Receiving routine prenatal care with no complaints associated with the pregnancy.  Past Medical History  Diagnosis Date  . Diabetes mellitus   . Juvenile diabetes mellitus   . Juvenile diabetes 2000  . Neuropathy ([place])   . High cholesterol   . HSV-2 infection   . STD (sexually transmitted disease)    Past Surgical History  Procedure Laterality Date  . Cesarean section    . Wisdom tooth extraction    . Cesarean section N/A 07/05/2013    Procedure: CESAREAN SECTION;  Surgeon: Emily Filbert, MD;  Location: [facility];  Service: Obstetrics;  Laterality: N/A;  . Cesarean section N/A 07/25/2014    Procedure: CESAREAN SECTION;  Surgeon: Donnamae Jude, MD;  Location: [facility];  Service: Obstetrics;  Laterality: N/A;   Family History  Problem Relation Age of Onset  . Stroke Mother   . Hypertension Mother   . Heart disease Mother   . Kidney disease Mother   . Hyperlipidemia Father   . Diabetes Father    Social History  Substance Use Topics  . Smoking status: Current Every Day Smoker --  0.25 packs/day    Types: Cigarettes  . Smokeless tobacco: Never Used  . Alcohol Use: No   OB History    Gravida Para Term Preterm AB TAB SAB Ectopic Multiple Living   5 3 0 3 1 0 1 0 0 3      Review of Systems 10 systems reviewed and all are negative for acute change except as noted in the HPI.  Allergies  Hydromorphone hcl  Home Medications   Prior to Admission medications   Medication Sig Start Date End Date Taking? Authorizing Provider  aspirin EC 81 MG tablet Take 1 tablet (81 mg total) by mouth daily. 07/14/15   Peggy Constant, MD  Blood Glucose Monitoring Suppl (ACCU-CHEK AVIVA PLUS) w/Device KIT Use to check blood sugar 5 times per day. 06/12/15   Renato Shin, MD  glucose blood (ACCU-CHEK AVIVA PLUS) test strip Use to check blood sugar 5 times per day. And Lancets 5/day. 06/12/15   Renato Shin, MD  insulin aspart (NOVOLOG) 100 UNIT/ML injection For use in pump, total of 30 units per day 07/15/15   Renato Shin, MD  Insulin Disposable Pump (V-GO 20) KIT 1 Device by Does not apply route daily.    Historical Provider, MD  neomycin-polymyxin-hydrocortisone (CORTISPORIN) 3.5-10000-1 otic suspension Place 4 drops into the right ear 3 (three) times daily. 09/20/15   Okey Regal, PA-C  Prenatal Multivit-Min-Fe-FA (PRENATAL VITAMINS) 0.8 MG tablet Take 1 tablet by  mouth daily. 08/24/15   Manya Silvas, CNM  PRESCRIPTION MEDICATION Take 1 tablet by mouth daily. Reported on 07/14/2015    Historical Provider, MD  Transparent Dressings (OPSITE IV 3000) MISC 1 Device by Does not apply route daily. 06/10/15   Renato Shin, MD  valACYclovir (VALTREX) 500 MG tablet Take 1 tablet (500 mg total) by mouth 2 (two) times daily. Patient not taking: Reported on 07/14/2015 07/23/14   Seabron Spates, CNM   BP 109/86 mmHg  Pulse 106  Temp(Src) 98.3 F (36.8 C) (Oral)  Resp 18  Ht 5' 5"  (1.651 m)  Wt 65.772 kg  BMI 24.13 kg/m2  SpO2 98%  LMP 03/15/2015   Physical Exam  Constitutional: She is  oriented to person, place, and time. She appears well-developed and well-nourished.  HENT:  Head: Normocephalic.  Small amount of erythema to the right external auditory canal, this appears to be a forming papule with no signs of pustule, no surrounding erythema, edema. No tenderness to palpation of the surrounding soft tissue, no mastoid tenderness. No signs of otitis media  Eyes: EOM are normal.  Neck: Normal range of motion.  Pulmonary/Chest: Effort normal.  Abdominal: She exhibits no distension.  Musculoskeletal: Normal range of motion.  Neurological: She is alert and oriented to person, place, and time.  Psychiatric: She has a normal mood and affect.  Nursing note and vitals reviewed.  ED Course  Procedures  DIAGNOSTIC STUDIES:  Oxygen Saturation is 100% on RA, normal by my interpretation.    COORDINATION OF CARE:  5:05 PM Recommended warm compress. Will discharge with topical antibiotic. Discussed treatment plan with pt at bedside and pt agreed to plan.  Labs Review Labs Reviewed - No data to display  Imaging Review No results found. I have personally reviewed and evaluated these images and lab results as part of my medical decision-making.   EKG Interpretation None      MDM   Final diagnoses:  Otitis externa, right    Labs:   Imaging:  Consults:  Therapeutics:  Discharge Meds:   Assessment/Plan: Patient's presentation is most consistent with papule in the external auditory canal. She has no signs of surrounding infection. Patient has been using Q-tips on this which is cause significant inflammation and minor amount of superficial skin trauma. She will be placed on treatment for otitis externa, encouraged follow-up with her primary care provider if symptoms persist, return immediately if they worsen. She was pregnant, she denies any complaints of the pregnancy.   I personally performed the services described in this documentation, which was scribed in my  presence. The recorded information has been reviewed and is accurate.   Okey Regal, PA-C 09/20/15 1735  Leonard Schwartz, MD 09/21/15 1310

## 2015-09-25 ENCOUNTER — Telehealth: Payer: Self-pay | Admitting: Endocrinology

## 2015-09-25 ENCOUNTER — Ambulatory Visit (INDEPENDENT_AMBULATORY_CARE_PROVIDER_SITE_OTHER): Payer: Medicaid Other | Admitting: Family Medicine

## 2015-09-25 VITALS — BP 139/91 | HR 84 | Wt 144.0 lb

## 2015-09-25 DIAGNOSIS — O09212 Supervision of pregnancy with history of pre-term labor, second trimester: Secondary | ICD-10-CM

## 2015-09-25 DIAGNOSIS — O24012 Pre-existing diabetes mellitus, type 1, in pregnancy, second trimester: Secondary | ICD-10-CM | POA: Diagnosis not present

## 2015-09-25 DIAGNOSIS — O0992 Supervision of high risk pregnancy, unspecified, second trimester: Secondary | ICD-10-CM

## 2015-09-25 DIAGNOSIS — Z8751 Personal history of pre-term labor: Secondary | ICD-10-CM

## 2015-09-25 LAB — POCT URINALYSIS DIP (DEVICE)
BILIRUBIN URINE: NEGATIVE
Glucose, UA: 500 mg/dL — AB
KETONES UR: NEGATIVE mg/dL
LEUKOCYTES UA: NEGATIVE
NITRITE: NEGATIVE
Protein, ur: >=300 mg/dL — AB
Specific Gravity, Urine: 1.025 (ref 1.005–1.030)
Urobilinogen, UA: 0.2 mg/dL (ref 0.0–1.0)
pH: 7 (ref 5.0–8.0)

## 2015-09-25 NOTE — Telephone Encounter (Signed)
please call patient: Please continue the same medication Ov next available.

## 2015-09-25 NOTE — Telephone Encounter (Signed)
I contacted the pt. She wanted to report the blood sugar reading.  07/28/2015: 140 pm: 345 550 am: 361 07/26/2015: Fasting: 515 10:45 am: 215 09/22/2015 7:30 am: 401 140 pm: 191 09/21/2015: 1230 am: 101 630 am: 332   Pt stated she is not using the insulin pump at this time and has been taking between 10-12 units of novolog in the morning and evening. Could you please advise and send response to [facility] (I am leaving due to Over time this afternoon)

## 2015-09-25 NOTE — Telephone Encounter (Signed)
Pt calling needs a call back to discuss the info she got from her md today and a possible drug adjustment

## 2015-09-25 NOTE — Patient Instructions (Signed)

## 2015-09-25 NOTE — Progress Notes (Signed)
Subjective:  Ann Robertson is a 25 y.o. [id number] at [redacted]w[redacted]d being seen today for ongoing prenatal care.  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  Patient reports no complaints.  Contractions: Not present. Vag. Bleeding: None.  Movement: Present. Denies leaking of fluid.   Patient reports blood sugars as "high, like 400-500 range".  She takes: Novolog 70/30 - 10-12 units qAM and Novolog 70/30 - 10 units in afternoon.  She reports some lows, but never checks, just eats something to bring her blood sugars up.  Has not been to endocrinologist in 2 months due to transportation problems, but wants them to manage her blood sugars.    The following portions of the patient's history were reviewed and updated as appropriate: allergies, current medications, past family history, past medical history, past social history, past surgical history and problem list. Problem list updated.  Objective:   Filed Vitals:   09/25/15 0923  BP: 139/91  Pulse: 84  Weight: 144 lb (65.318 kg)    Fetal Status: Fetal Heart Rate (bpm): 140   Movement: Present     General:  Alert, oriented and cooperative. Patient is in no acute distress.  Skin: Skin is warm and dry. No rash noted.   Cardiovascular: Normal heart rate noted  Respiratory: Normal respiratory effort, no problems with respiration noted  Abdomen: Soft, gravid, appropriate for  gestational age. Pain/Pressure: Present     Pelvic: Vag. Bleeding: None     Cervical exam deferred        Extremities: Normal range of motion.  Edema: None  Mental Status: Normal mood and affect. Normal behavior. Normal judgment and thought content.   Urinalysis:      Assessment and Plan:  Pregnancy: [id number] at [redacted]w[redacted]d  1. Supervision of high risk pregnancy, antepartum, second trimester FHT and FH normal  2. Type 1 diabetes mellitus complicating pregnancy, antepartum, second trimester I discussed with the patient risks of elevated blood sugar, including still birth.  Patient unwilling to be hospitalized for CBG control.  She will arrange appt with endocrine.  Will follow up in 2 weeks - if she hasn't seen endorine, she will bring CBGs here to have [place] adjust her meds.  F/u US tomorrow.  3. History of preterm delivery    Preterm labor symptoms and general obstetric precautions including but not limited to vaginal bleeding, contractions, leaking of fluid and fetal movement were reviewed in detail with the patient. Please refer to After Visit Summary for other counseling recommendations.  No Follow-up on file.   Truett Mainland, DO

## 2015-09-26 ENCOUNTER — Ambulatory Visit (HOSPITAL_COMMUNITY)
Admission: RE | Admit: 2015-09-26 | Discharge: 2015-09-26 | Disposition: A | Discharge status: Home / Self Care | Payer: Medicaid Other | Source: Ambulatory Visit | Attending: Family Medicine | Admitting: Family Medicine

## 2015-09-26 ENCOUNTER — Encounter (HOSPITAL_COMMUNITY): Payer: Self-pay

## 2015-09-26 VITALS — BP 130/89 | HR 98 | Wt 143.2 lb

## 2015-09-26 DIAGNOSIS — O24012 Pre-existing diabetes mellitus, type 1, in pregnancy, second trimester: Secondary | ICD-10-CM | POA: Diagnosis present

## 2015-09-26 DIAGNOSIS — Z3A23 23 weeks gestation of pregnancy: Secondary | ICD-10-CM | POA: Diagnosis not present

## 2015-09-26 DIAGNOSIS — O09212 Supervision of pregnancy with history of pre-term labor, second trimester: Secondary | ICD-10-CM | POA: Diagnosis not present

## 2015-09-26 DIAGNOSIS — O24019 Pre-existing diabetes mellitus, type 1, in pregnancy, unspecified trimester: Secondary | ICD-10-CM

## 2015-09-26 DIAGNOSIS — O99332 Smoking (tobacco) complicating pregnancy, second trimester: Secondary | ICD-10-CM | POA: Diagnosis not present

## 2015-09-26 DIAGNOSIS — O09292 Supervision of pregnancy with other poor reproductive or obstetric history, second trimester: Secondary | ICD-10-CM | POA: Insufficient documentation

## 2015-09-26 DIAGNOSIS — Z8751 Personal history of pre-term labor: Secondary | ICD-10-CM

## 2015-09-26 DIAGNOSIS — O0992 Supervision of high risk pregnancy, unspecified, second trimester: Secondary | ICD-10-CM

## 2015-09-26 DIAGNOSIS — O34219 Maternal care for unspecified type scar from previous cesarean delivery: Secondary | ICD-10-CM

## 2015-09-26 LAB — PROTEIN / CREATININE RATIO, URINE
Creatinine, Urine: 109 mg/dL (ref 20–320)
Protein Creatinine Ratio: 1853 mg/g creat — ABNORMAL HIGH (ref 21–161)
TOTAL PROTEIN, URINE: 202 mg/dL — AB (ref 5–24)

## 2015-09-26 LAB — GLUCOSE, CAPILLARY: Glucose-Capillary: 149 mg/dL — ABNORMAL HIGH (ref 65–99)

## 2015-09-26 NOTE — Telephone Encounter (Signed)
I contacted the pt and advised of note below. Pt scheduled for 10/01/2015 at 945 am.

## 2015-09-29 ENCOUNTER — Encounter (HOSPITAL_COMMUNITY): Payer: Self-pay | Admitting: *Deleted

## 2015-09-29 ENCOUNTER — Other Ambulatory Visit (HOSPITAL_COMMUNITY): Payer: Self-pay | Admitting: *Deleted

## 2015-09-29 ENCOUNTER — Inpatient Hospital Stay (HOSPITAL_COMMUNITY)
Admission: AD | Admit: 2015-09-29 | Discharge: 2015-09-30 | Disposition: A | Discharge status: Home / Self Care | Payer: Medicaid Other | Source: Ambulatory Visit | Attending: Family Medicine | Admitting: Family Medicine

## 2015-09-29 DIAGNOSIS — Z9119 Patient's noncompliance with other medical treatment and regimen: Secondary | ICD-10-CM | POA: Insufficient documentation

## 2015-09-29 DIAGNOSIS — E109 Type 1 diabetes mellitus without complications: Secondary | ICD-10-CM | POA: Insufficient documentation

## 2015-09-29 DIAGNOSIS — Z3A23 23 weeks gestation of pregnancy: Secondary | ICD-10-CM | POA: Insufficient documentation

## 2015-09-29 DIAGNOSIS — K625 Hemorrhage of anus and rectum: Secondary | ICD-10-CM | POA: Diagnosis not present

## 2015-09-29 DIAGNOSIS — A09 Infectious gastroenteritis and colitis, unspecified: Secondary | ICD-10-CM | POA: Diagnosis not present

## 2015-09-29 DIAGNOSIS — F17210 Nicotine dependence, cigarettes, uncomplicated: Secondary | ICD-10-CM | POA: Diagnosis not present

## 2015-09-29 DIAGNOSIS — O24319 Unspecified pre-existing diabetes mellitus in pregnancy, unspecified trimester: Secondary | ICD-10-CM

## 2015-09-29 DIAGNOSIS — O24012 Pre-existing diabetes mellitus, type 1, in pregnancy, second trimester: Secondary | ICD-10-CM | POA: Diagnosis not present

## 2015-09-29 DIAGNOSIS — O99332 Smoking (tobacco) complicating pregnancy, second trimester: Secondary | ICD-10-CM | POA: Diagnosis not present

## 2015-09-29 DIAGNOSIS — O26892 Other specified pregnancy related conditions, second trimester: Secondary | ICD-10-CM | POA: Diagnosis not present

## 2015-09-29 DIAGNOSIS — Z794 Long term (current) use of insulin: Secondary | ICD-10-CM | POA: Insufficient documentation

## 2015-09-29 DIAGNOSIS — K529 Noninfective gastroenteritis and colitis, unspecified: Secondary | ICD-10-CM

## 2015-09-29 DIAGNOSIS — O0992 Supervision of high risk pregnancy, unspecified, second trimester: Secondary | ICD-10-CM

## 2015-09-29 DIAGNOSIS — O99612 Diseases of the digestive system complicating pregnancy, second trimester: Secondary | ICD-10-CM | POA: Diagnosis not present

## 2015-09-29 LAB — COMPREHENSIVE METABOLIC PANEL
ALT: 10 U/L — ABNORMAL LOW (ref 14–54)
AST: 19 U/L (ref 15–41)
Albumin: 2.6 g/dL — ABNORMAL LOW (ref 3.5–5.0)
Alkaline Phosphatase: 60 U/L (ref 38–126)
Anion gap: 8 (ref 5–15)
BUN: 11 mg/dL (ref 6–20)
CO2: 23 mmol/L (ref 22–32)
Calcium: 8.9 mg/dL (ref 8.9–10.3)
Chloride: 101 mmol/L (ref 101–111)
Creatinine, Ser: 0.65 mg/dL (ref 0.44–1.00)
GFR calc Af Amer: >60 mL/min (ref >60)
GFR calc non Af Amer: >60 mL/min (ref >60)
Glucose, Bld: 246 mg/dL — ABNORMAL HIGH (ref 65–99)
Potassium: 4.6 mmol/L (ref 3.5–5.1)
Sodium: 132 mmol/L — ABNORMAL LOW (ref 135–145)
Total Bilirubin: 0.6 mg/dL (ref 0.3–1.2)
Total Protein: 6.2 g/dL — ABNORMAL LOW (ref 6.5–8.1)

## 2015-09-29 LAB — URINALYSIS, ROUTINE W REFLEX MICROSCOPIC
Bilirubin Urine: NEGATIVE
Glucose, UA: >1000 mg/dL — AB
Ketones, ur: 15 mg/dL — AB
Leukocytes, UA: NEGATIVE
Nitrite: NEGATIVE
Protein, ur: 100 mg/dL — AB
Specific Gravity, Urine: 1.025 (ref 1.005–1.030)
pH: 5.5 (ref 5.0–8.0)

## 2015-09-29 LAB — CBC
HCT: 29.5 % — ABNORMAL LOW (ref 36.0–46.0)
Hemoglobin: 10.3 g/dL — ABNORMAL LOW (ref 12.0–15.0)
MCH: 31.3 pg (ref 26.0–34.0)
MCHC: 34.9 g/dL (ref 30.0–36.0)
MCV: 89.7 fL (ref 78.0–100.0)
Platelets: 278 10*3/uL (ref 150–400)
RBC: 3.29 MIL/uL — ABNORMAL LOW (ref 3.87–5.11)
RDW: 12.3 % (ref 11.5–15.5)
WBC: 10.6 10*3/uL — ABNORMAL HIGH (ref 4.0–10.5)

## 2015-09-29 LAB — URINE MICROSCOPIC-ADD ON

## 2015-09-29 NOTE — MAU Note (Signed)
Pt. States that she is having rectal bleeding  that started at 10:30 this morning. Pt. States that she has been abdominal pain, she doesn't think it is contractions, but is unsure.

## 2015-09-29 NOTE — MAU Provider Note (Signed)
History     CSN: [id number]  Arrival date and time: 09/29/15 2053   First Provider Initiated Contact with Patient 09/29/15 2243      No chief complaint on file.  HPI  Ann Robertson 25 y.o. [id number] @ 51w5dpresents for rectal bleeding and diarrhea x 4 times today starting at 10 am. She also is having abdominal pain. She has been able to eat and hold down food and she is not nauseated.   Past Medical History  Diagnosis Date  . Diabetes mellitus   . Juvenile diabetes mellitus   . Juvenile diabetes 2000  . Neuropathy ([place]   . High cholesterol   . HSV-2 infection   . STD (sexually transmitted disease)     Past Surgical History  Procedure Laterality Date  . Cesarean section    . Wisdom tooth extraction    . Cesarean section N/A 07/05/2013    Procedure: CESAREAN SECTION;  Surgeon: MEmily Filbert MD;  [facility];  Service: Obstetrics;  Laterality: N/A;  . Cesarean section N/A 07/25/2014    Procedure: CESAREAN SECTION;  Surgeon: TDonnamae Jude MD;  [facility];  Service: Obstetrics;  Laterality: N/A;    Family History  Problem Relation Age of Onset  . Stroke Mother   . Hypertension Mother   . Heart disease Mother   . Kidney disease Mother   . Hyperlipidemia Father   . Diabetes Father     Social History  Substance Use Topics  . Smoking status: Current Every Day Smoker -- 0.25 packs/day    Types: Cigarettes  . Smokeless tobacco: Never Used  . Alcohol Use: No    Allergies:  Allergies  Allergen Reactions  . Hydromorphone Hcl Itching and Rash    Prescriptions prior to admission  Medication Sig Dispense Refill Last Dose  . aspirin EC 81 MG tablet Take 1 tablet (81 mg total) by mouth daily. (Patient not taking: Reported on 09/26/2015) 30 tablet 6 Not Taking  . Blood Glucose Monitoring Suppl (ACCU-CHEK AVIVA PLUS) w/Device KIT Use to check blood sugar 5 times per day. 1 kit 2 Taking  . glucose blood (ACCU-CHEK AVIVA PLUS) test strip Use to check blood sugar 5  times per day. And Lancets 5/day. 200 each 11 Taking  . insulin aspart (NOVOLOG) 100 UNIT/ML injection For use in pump, total of 30 units per day 1 vial 5 Taking  . neomycin-polymyxin-hydrocortisone (CORTISPORIN) 3.5-10000-1 otic suspension Place 4 drops into the right ear 3 (three) times daily. 10 mL 0   . Prenatal Multivit-Min-Fe-FA (PRENATAL VITAMINS) 0.8 MG tablet Take 1 tablet by mouth daily. 30 tablet 12 Taking  . valACYclovir (VALTREX) 500 MG tablet Take 1 tablet (500 mg total) by mouth 2 (two) times daily. (Patient not taking: Reported on 07/14/2015) 6 tablet 0 Not Taking    Review of Systems  Constitutional: Negative for fever.  Gastrointestinal: Positive for abdominal pain, diarrhea and blood in stool.  All other systems reviewed and are negative.  Physical Exam   Blood pressure 128/86, pulse 107, temperature 98.1 F (36.7 C), temperature source Oral, last menstrual period 03/15/2015, not currently breastfeeding.  Physical Exam  Nursing note and vitals reviewed. Constitutional: She is oriented to person, place, and time. She appears well-developed and well-nourished. No distress.  HENT:  Head: Normocephalic and atraumatic.  Cardiovascular: Normal rate.   Respiratory: Effort normal. No respiratory distress.  GI: Soft. She exhibits no distension. There is no tenderness.  Genitourinary: Vagina normal and uterus normal.  Musculoskeletal: Normal range of motion.  Neurological: She is alert and oriented to person, place, and time. She has normal reflexes.  Skin: Skin is warm and dry.  Psychiatric: She has a normal mood and affect. Her behavior is normal. Judgment and thought content normal.   Results for orders placed or performed during the hospital encounter of 09/29/15 (from the past 24 hour(s))  CBC     Status: Abnormal   Collection Time: 09/29/15 10:21 PM  Result Value Ref Range   WBC 10.6 (H) 4.0 - 10.5 K/uL   RBC 3.29 (L) 3.87 - 5.11 MIL/uL   Hemoglobin 10.3 (L) 12.0 -  15.0 g/dL   HCT 29.5 (L) 36.0 - 46.0 %   MCV 89.7 78.0 - 100.0 fL   MCH 31.3 26.0 - 34.0 pg   MCHC 34.9 30.0 - 36.0 g/dL   RDW 12.3 11.5 - 15.5 %   Platelets 278 150 - 400 K/uL   Results for orders placed or performed during the hospital encounter of 09/29/15 (from the past 24 hour(s))  CBC     Status: Abnormal   Collection Time: 09/29/15 10:21 PM  Result Value Ref Range   WBC 10.6 (H) 4.0 - 10.5 K/uL   RBC 3.29 (L) 3.87 - 5.11 MIL/uL   Hemoglobin 10.3 (L) 12.0 - 15.0 g/dL   HCT 29.5 (L) 36.0 - 46.0 %   MCV 89.7 78.0 - 100.0 fL   MCH 31.3 26.0 - 34.0 pg   MCHC 34.9 30.0 - 36.0 g/dL   RDW 12.3 11.5 - 15.5 %   Platelets 278 150 - 400 K/uL  Comprehensive metabolic panel     Status: Abnormal   Collection Time: 09/29/15 10:22 PM  Result Value Ref Range   Sodium 132 (L) 135 - 145 mmol/L   Potassium 4.6 3.5 - 5.1 mmol/L   Chloride 101 101 - 111 mmol/L   CO2 23 22 - 32 mmol/L   Glucose, Bld 246 (H) 65 - 99 mg/dL   BUN 11 6 - 20 mg/dL   Creatinine, Ser 0.65 0.44 - 1.00 mg/dL   Calcium 8.9 8.9 - 10.3 mg/dL   Total Protein 6.2 (L) 6.5 - 8.1 g/dL   Albumin 2.6 (L) 3.5 - 5.0 g/dL   AST 19 15 - 41 U/L   ALT 10 (L) 14 - 54 U/L   Alkaline Phosphatase 60 38 - 126 U/L   Total Bilirubin 0.6 0.3 - 1.2 mg/dL   GFR calc non Af Amer >60 >60 mL/min   GFR calc Af Amer >60 >60 mL/min   Anion gap 8 5 - 15  Urinalysis, Routine w reflex microscopic (not at [facility])     Status: Abnormal   Collection Time: 09/29/15 10:30 PM  Result Value Ref Range   Color, Urine YELLOW YELLOW   APPearance CLEAR CLEAR   Specific Gravity, Urine 1.025 1.005 - 1.030   pH 5.5 5.0 - 8.0   Glucose, UA >1000 (A) NEGATIVE mg/dL   Hgb urine dipstick SMALL (A) NEGATIVE   Bilirubin Urine NEGATIVE NEGATIVE   Ketones, ur 15 (A) NEGATIVE mg/dL   Protein, ur 100 (A) NEGATIVE mg/dL   Nitrite NEGATIVE NEGATIVE   Leukocytes, UA NEGATIVE NEGATIVE  Urine microscopic-add on     Status: Abnormal   Collection Time: 09/29/15 10:30 PM   Result Value Ref Range   Squamous Epithelial / LPF 0-5 (A) NONE SEEN   WBC, UA 0-5 0 - 5 WBC/hpf   RBC / HPF 0-5 0 -  5 RBC/hpf   Bacteria, UA RARE (A) NONE SEEN   Casts HYALINE CASTS (A) NEGATIVE  Positive fetal heart tones MAU Course  Procedures  MDM This patient presents with blood glucose levels that are high but she is noncompliant and states her blood glucose stays high 250-300's most of the time. After consulting with Dr. Nehemiah Settle, I will contact on call GI service ([place]) for further recommendation regarding the blood in her stool. Called answering service at 1121pm. Spoke with Dr. Hilarie Fredrickson and he reccommended ordering a GI Pathogen Panel tonight. If the patient is discharged she can follow up at the office for further studies.  Assessment and Plan  Rectal Bleeding in pregnancy  Follow up with [facility] Dr Hilarie Fredrickson Drink plenty of fluids Return to MAU if unable to hold down fluids or prn Discharge to home  [facility] 09/29/2015, 10:52 PM

## 2015-09-30 ENCOUNTER — Telehealth: Payer: Self-pay

## 2015-09-30 DIAGNOSIS — A09 Infectious gastroenteritis and colitis, unspecified: Secondary | ICD-10-CM | POA: Diagnosis not present

## 2015-09-30 DIAGNOSIS — O99612 Diseases of the digestive system complicating pregnancy, second trimester: Secondary | ICD-10-CM

## 2015-09-30 LAB — GLUCOSE, CAPILLARY: Glucose-Capillary: 218 mg/dL — ABNORMAL HIGH (ref 65–99)

## 2015-09-30 NOTE — Discharge Instructions (Signed)
Viral Gastroenteritis  Viral gastroenteritis is also called stomach flu. This illness is caused by a certain type of germ (virus). It can cause sudden watery poop (diarrhea) and throwing up (vomiting). This can cause you to lose body fluids (dehydration). This illness usually lasts for 3 to 8 days. It usually goes away on its own.  HOME CARE   · Drink enough fluids to keep your pee (urine) clear or pale yellow. Drink small amounts of fluids often.  · Ask your doctor how to replace body fluid losses (rehydration).  · Avoid:    Foods high in sugar.    Alcohol.    Bubbly (carbonated) drinks.    Tobacco.    Juice.    Caffeine drinks.    Very hot or cold fluids.    Fatty, greasy foods.    Eating too much at one time.    Dairy products until 24 to 48 hours after your watery poop stops.  · You may eat foods with active cultures (probiotics). They can be found in some yogurts and supplements.  · Wash your hands well to avoid spreading the illness.  · Only take medicines as told by your doctor. Do not give aspirin to children. Do not take medicines for watery poop (antidiarrheals).  · Ask your doctor if you should keep taking your regular medicines.  · Keep all doctor visits as told.  GET HELP RIGHT AWAY IF:   · You cannot keep fluids down.  · You do not pee at least once every 6 to 8 hours.  · You are short of breath.  · You see blood in your poop or throw up. This may look like coffee grounds.  · You have belly (abdominal) pain that gets worse or is just in one small spot (localized).  · You keep throwing up or having watery poop.  · You have a fever.  · The patient is a child younger than 3 months, and he or she has a fever.  · The patient is a child older than 3 months, and he or she has a fever and problems that do not go away.  · The patient is a child older than 3 months, and he or she has a fever and problems that suddenly get worse.  · The patient is a baby, and he or she has no tears when crying.  MAKE SURE YOU:      · Understand these instructions.  · Will watch your condition.  · Will get help right away if you are not doing well or get worse.     This information is not intended to replace advice given to you by your health care provider. Make sure you discuss any questions you have with your health care provider.     Document Released: 11/03/2007 Document Revised: 08/09/2011 Document Reviewed: 03/03/2011  Elsevier Interactive Patient Education ©2016 [employer]

## 2015-09-30 NOTE — Telephone Encounter (Signed)
-----   Message from Jerene Bears, MD sent at 09/30/2015  8:04 AM EDT ----- Regarding: New pt Called by midwife at [facility] last night Pt there in urgent care, 24 weeks preg, with rectal bleeding and diarrhea They are requesting an appt for her. This can be with APP or any available provider Thanks JMP

## 2015-09-30 NOTE — Telephone Encounter (Signed)
Pt scheduled to see Nicoletta Ba PA 10/07/15@11am . Pt aware of appt.

## 2015-10-01 ENCOUNTER — Ambulatory Visit: Payer: Medicaid Other | Admitting: Endocrinology

## 2015-10-01 DIAGNOSIS — Z0289 Encounter for other administrative examinations: Secondary | ICD-10-CM

## 2015-10-07 ENCOUNTER — Ambulatory Visit: Payer: Medicaid Other | Admitting: Physician Assistant

## 2015-10-09 ENCOUNTER — Encounter: Payer: Medicaid Other | Admitting: Family

## 2015-10-22 ENCOUNTER — Emergency Department (HOSPITAL_BASED_OUTPATIENT_CLINIC_OR_DEPARTMENT_OTHER)
Admission: EM | Admit: 2015-10-22 | Discharge: 2015-10-23 | Discharge status: Against Medical Advice | Payer: Medicaid Other | Attending: Emergency Medicine | Admitting: Emergency Medicine

## 2015-10-22 ENCOUNTER — Telehealth: Payer: Self-pay | Admitting: *Deleted

## 2015-10-22 ENCOUNTER — Encounter (HOSPITAL_BASED_OUTPATIENT_CLINIC_OR_DEPARTMENT_OTHER): Payer: Self-pay

## 2015-10-22 DIAGNOSIS — E109 Type 1 diabetes mellitus without complications: Secondary | ICD-10-CM | POA: Insufficient documentation

## 2015-10-22 DIAGNOSIS — R519 Headache, unspecified: Secondary | ICD-10-CM

## 2015-10-22 DIAGNOSIS — Z3A26 26 weeks gestation of pregnancy: Secondary | ICD-10-CM | POA: Insufficient documentation

## 2015-10-22 DIAGNOSIS — Z8759 Personal history of other complications of pregnancy, childbirth and the puerperium: Secondary | ICD-10-CM | POA: Diagnosis not present

## 2015-10-22 DIAGNOSIS — O26892 Other specified pregnancy related conditions, second trimester: Secondary | ICD-10-CM | POA: Insufficient documentation

## 2015-10-22 DIAGNOSIS — R51 Headache: Secondary | ICD-10-CM | POA: Insufficient documentation

## 2015-10-22 DIAGNOSIS — O24012 Pre-existing diabetes mellitus, type 1, in pregnancy, second trimester: Secondary | ICD-10-CM | POA: Insufficient documentation

## 2015-10-22 DIAGNOSIS — F17210 Nicotine dependence, cigarettes, uncomplicated: Secondary | ICD-10-CM | POA: Insufficient documentation

## 2015-10-22 DIAGNOSIS — O99332 Smoking (tobacco) complicating pregnancy, second trimester: Secondary | ICD-10-CM | POA: Insufficient documentation

## 2015-10-22 HISTORY — DX: Unspecified pre-eclampsia, unspecified trimester: O14.90

## 2015-10-22 LAB — COMPREHENSIVE METABOLIC PANEL
ALT: 9 U/L — ABNORMAL LOW (ref 14–54)
AST: 16 U/L (ref 15–41)
Albumin: 2.6 g/dL — ABNORMAL LOW (ref 3.5–5.0)
Alkaline Phosphatase: 61 U/L (ref 38–126)
Anion gap: 8 (ref 5–15)
BUN: 9 mg/dL (ref 6–20)
CO2: 23 mmol/L (ref 22–32)
Calcium: 8.1 mg/dL — ABNORMAL LOW (ref 8.9–10.3)
Chloride: 100 mmol/L — ABNORMAL LOW (ref 101–111)
Creatinine, Ser: 1 mg/dL (ref 0.44–1.00)
GFR calc Af Amer: >60 mL/min (ref >60)
GFR calc non Af Amer: >60 mL/min (ref >60)
Glucose, Bld: 265 mg/dL — ABNORMAL HIGH (ref 65–99)
Potassium: 4 mmol/L (ref 3.5–5.1)
Sodium: 131 mmol/L — ABNORMAL LOW (ref 135–145)
Total Bilirubin: 0.7 mg/dL (ref 0.3–1.2)
Total Protein: 6.3 g/dL — ABNORMAL LOW (ref 6.5–8.1)

## 2015-10-22 LAB — URINALYSIS, ROUTINE W REFLEX MICROSCOPIC
Bilirubin Urine: NEGATIVE
Glucose, UA: >1000 mg/dL — AB
Hgb urine dipstick: NEGATIVE
Ketones, ur: 15 mg/dL — AB
Leukocytes, UA: NEGATIVE
Nitrite: NEGATIVE
Protein, ur: >300 mg/dL — AB
Specific Gravity, Urine: 1.023 (ref 1.005–1.030)
pH: 7.5 (ref 5.0–8.0)

## 2015-10-22 LAB — URINE MICROSCOPIC-ADD ON

## 2015-10-22 LAB — CBC WITH DIFFERENTIAL/PLATELET
Basophils Absolute: 0 10*3/uL (ref 0.0–0.1)
Basophils Relative: 0 %
Eosinophils Absolute: 0.1 10*3/uL (ref 0.0–0.7)
Eosinophils Relative: 1 %
HCT: 30.4 % — ABNORMAL LOW (ref 36.0–46.0)
Hemoglobin: 10.5 g/dL — ABNORMAL LOW (ref 12.0–15.0)
Lymphocytes Relative: 14 %
Lymphs Abs: 1.4 10*3/uL (ref 0.7–4.0)
MCH: 31.6 pg (ref 26.0–34.0)
MCHC: 34.5 g/dL (ref 30.0–36.0)
MCV: 91.6 fL (ref 78.0–100.0)
Monocytes Absolute: 0.7 10*3/uL (ref 0.1–1.0)
Monocytes Relative: 7 %
Neutro Abs: 8.2 10*3/uL — ABNORMAL HIGH (ref 1.7–7.7)
Neutrophils Relative %: 79 %
Platelets: 309 10*3/uL (ref 150–400)
RBC: 3.32 MIL/uL — ABNORMAL LOW (ref 3.87–5.11)
RDW: 11.4 % — ABNORMAL LOW (ref 11.5–15.5)
WBC: 10.4 10*3/uL (ref 4.0–10.5)

## 2015-10-22 MED ORDER — DIPHENHYDRAMINE HCL 25 MG PO CAPS
25.0000 mg | ORAL_CAPSULE | Freq: Once | ORAL | Status: AC
  Administered 2015-10-22: 25 mg via ORAL
  Filled 2015-10-22: qty 1

## 2015-10-22 MED ORDER — ACETAMINOPHEN 325 MG PO TABS
650.0000 mg | ORAL_TABLET | Freq: Once | ORAL | Status: AC
  Administered 2015-10-22: 650 mg via ORAL
  Filled 2015-10-22: qty 2

## 2015-10-22 NOTE — ED Notes (Addendum)
HA-since yesterday-[redacted] weeks pregnant with hx of preeclampsia-last dose tylenol 10am-states she advised OB but did not have ride to office-denies abd pain, vaginal d/c or bleeding

## 2015-10-22 NOTE — Progress Notes (Signed)
Pt to transfer to [facility] for MRI. Discussed with Dr. Elonda Husky. OK to d/c fetal monitoring at this time.

## 2015-10-22 NOTE — Telephone Encounter (Signed)
Patient called and stated that she has had a horrible headache for the last 2 days. She has tried Tylenol ES 2 tabs every 4 hours but it hasn't touched it. She wants to know what else she can do and if she should go to MAU. She denied visual disturbances or epigastric pain. Hx preeclampsia and GDM. Discussed with Janann Colonel, CNM. Patient needs to come in for nurse visit for BP check and then can get with provider to address the headache. Patient is agreeable and will try to find a ride to come in after lunch this afternoon.

## 2015-10-22 NOTE — ED Provider Notes (Signed)
CSN: [id number]     Arrival date & time 10/22/15  1818 History   First MD Initiated Contact with Patient 10/22/15 1909     Chief Complaint  Patient presents with  . Headache   HPI   25 year old female presents today with complaints of headache. Patient is [redacted] week pregnant being followed at the women[facility] clinic. Patient does not have a specific OB/GYN. Patient notes that she woke up yesterday and started having a headache, she describes this as throbbing and diffuse. Patient notes a history of headaches that usually resolve with Tylenol. Patient notes that this morning she took 1000 mg of Tylenol which took the pain away, but she reports 4 hours later the pain returned and was severe in nature. Patient notes is abnormal for her headaches. Patient denies any neck stiffness, neurological deficits, fever, chills. Patient has history of preeclampsia and her last pregnancy. Patient denies any abdominal pain, vaginal discharge or bleeding. No trauma to the head.  Past Medical History  Diagnosis Date  . Diabetes mellitus   . Juvenile diabetes mellitus   . Juvenile diabetes 2000  . Neuropathy ([place])   . High cholesterol   . HSV-2 infection   . STD (sexually transmitted disease)   . Preeclampsia    Past Surgical History  Procedure Laterality Date  . Cesarean section    . Wisdom tooth extraction    . Cesarean section N/A 07/05/2013    Procedure: CESAREAN SECTION;  Surgeon: Emily Filbert, MD;  Location: [facility];  Service: Obstetrics;  Laterality: N/A;  . Cesarean section N/A 07/25/2014    Procedure: CESAREAN SECTION;  Surgeon: Donnamae Jude, MD;  Location: [facility];  Service: Obstetrics;  Laterality: N/A;   Family History  Problem Relation Age of Onset  . Stroke Mother   . Hypertension Mother   . Heart disease Mother   . Kidney disease Mother   . Hyperlipidemia Father   . Diabetes Father    Social History  Substance Use Topics  . Smoking status: Current Every Day Smoker -- 0.25 packs/day    Types:  Cigarettes  . Smokeless tobacco: Never Used  . Alcohol Use: No   OB History    Gravida Para Term Preterm AB TAB SAB Ectopic Multiple Living   _0     Review of Systems  All other systems reviewed and are negative.   Allergies  Hydromorphone hcl  Home Medications   Prior to Admission medications   Medication Sig Start Date End Date Taking? Authorizing Provider  aspirin EC 81 MG tablet Take 1 tablet (81 mg total) by mouth daily. Patient not taking: Reported on 09/26/2015 07/14/15   Mora Bellman, MD  Blood Glucose Monitoring Suppl (ACCU-CHEK AVIVA PLUS) w/Device KIT Use to check blood sugar 5 times per day. 06/12/15   Renato Shin, MD  glucose blood (ACCU-CHEK AVIVA PLUS) test strip Use to check blood sugar 5 times per day. And Lancets 5/day. 06/12/15   Renato Shin, MD  insulin aspart (NOVOLOG) 100 UNIT/ML injection For use in pump, total of 30 units per day 07/15/15   Renato Shin, MD  neomycin-polymyxin-hydrocortisone (CORTISPORIN) 3.5-10000-1 otic suspension Place 4 drops into the right ear 3 (three) times daily. 09/20/15   Okey Regal, PA-C  Prenatal Multivit-Min-Fe-FA (PRENATAL VITAMINS) 0.8 MG tablet Take 1 tablet by mouth daily. 08/24/15   Manya Silvas, CNM  valACYclovir (VALTREX) 500 MG tablet Take 1 tablet (500 mg total) by mouth  2 (two) times daily. Patient not taking: Reported on 07/14/2015 07/23/14   Seabron Spates, CNM   BP 118/91 mmHg  Pulse 123  Temp(Src) 98.4 F (36.9 C) (Oral)  Resp 18  Ht _0  (1.626 m)  Wt 64.592 kg  BMI 24.43 kg/m2  SpO2 99%  LMP 03/15/2015   Physical Exam  Constitutional: She is oriented to person, place, and time. She appears well-developed and well-nourished. No distress.  HENT:  Head: Normocephalic and atraumatic.  Eyes: Conjunctivae and EOM are normal. Pupils are equal, round, and reactive to light. Right eye exhibits no discharge. Left eye exhibits no discharge. No scleral icterus.  Neck: Normal range of motion.  Neck supple. No JVD present. No tracheal deviation present.  Pulmonary/Chest: Effort normal. No stridor.  Abdominal:  Gravid abdomen appropriate for gestational age  Musculoskeletal: Normal range of motion. She exhibits no edema or tenderness.  No significant lower extremity edema  Lymphadenopathy:    She has no cervical adenopathy.  Neurological: She is alert and oriented to person, place, and time. She has normal strength. She displays no atrophy and no tremor. No cranial nerve deficit or sensory deficit. She exhibits normal muscle tone. She displays a negative Romberg sign. She displays no seizure activity. Coordination and gait normal. GCS eye subscore is 4. GCS verbal subscore is 5. GCS motor subscore is 6.  Reflex Scores:      Patellar reflexes are 2+ on the right side and 2+ on the left side. Skin: She is not diaphoretic.  Psychiatric: She has a normal mood and affect. Her behavior is normal. Judgment and thought content normal.  Nursing note and vitals reviewed.  Fetal heart rate ranged from 135-140  ED Course  Procedures (including critical care time) Labs Review Labs Reviewed  URINALYSIS, ROUTINE W REFLEX MICROSCOPIC (NOT AT [facility]) - Abnormal; Notable for the following:    Glucose, UA >1000 (*)    Ketones, ur 15 (*)    Protein, ur >300 (*)    All other components within normal limits  CBC WITH DIFFERENTIAL/PLATELET - Abnormal; Notable for the following:    RBC 3.32 (*)    Hemoglobin 10.5 (*)    HCT 30.4 (*)    RDW 11.4 (*)    Neutro Abs 8.2 (*)    All other components within normal limits  COMPREHENSIVE METABOLIC PANEL - Abnormal; Notable for the following:    Sodium 131 (*)    Chloride 100 (*)    Glucose, Bld 265 (*)    Calcium 8.1 (*)    Total Protein 6.3 (*)    Albumin 2.6 (*)    ALT 9 (*)    All other components within normal limits  URINE MICROSCOPIC-ADD ON - Abnormal; Notable for the following:    Squamous Epithelial / LPF 0-5 (*)    Bacteria, UA FEW (*)     All other components within normal limits    Imaging Review No results found. I have personally reviewed and evaluated these images and lab results as part of my medical decision-making.   EKG Interpretation None      MDM   Final diagnoses:  Acute nonintractable headache, unspecified headache type    Labs: Urinalysis, CBC, CMP- protein greater than 300  Imaging:  Consults: OB/GYN  Therapeutics: Tylenol, Benadryl  Discharge Meds:   Assessment/Plan: 25 year old female presents today with headache. Patient insisting that symptoms are more severe than previous, and is concerned that they will immediately return after the Tylenol wears  off. She has a history of aneurysm in the family and is acutely concerned for this. Patient was noted to have a blood pressure 132/89 with protein in her urine, she has a history of preeclampsia. Patient's blood pressure returned to normal readings at 118/91, patient has previous visits with protein in her urine and even higher elevations in her blood pressure. She has no changes in LFTs. I consult the OB/GYN to verify that this was appropriate per patient and no further evaluation was necessary other than that for the headache. They agreed with my plan. Patient will need MRI study to further evaluate the headache and rule out aneurysm, thrombosis, or any other significant intracranial abnormality. At time of transfer patient's headache has resolved, she is stable and ready for transport. Likely if patient's MRI is negative she will be safely discharged home with OB/GYN follow-up. Patient verbalized understanding and agreement today's plan and had no further questions or concerns.   Fetal heart rate between 135 and 40 throughout ED stay.      Okey Regal, PA-C 10/22/15 2221  Alfonzo Beers, MD 10/22/15 2227

## 2015-10-22 NOTE — ED Notes (Signed)
Pt placed on toca and fetal heart rate monitor and rapid response at womens notified and monitoring patient at this time. Pt continues to deny abdominal pain or discharge, continues to c/o of headache x 2 days

## 2015-10-22 NOTE — ED Notes (Signed)
Family brought back to room per patient's request

## 2015-10-23 ENCOUNTER — Emergency Department (HOSPITAL_COMMUNITY): Payer: Medicaid Other

## 2015-10-23 LAB — CBG MONITORING, ED
Glucose-Capillary: 275 mg/dL — ABNORMAL HIGH (ref 65–99)
Glucose-Capillary: 396 mg/dL — ABNORMAL HIGH (ref 65–99)

## 2015-10-23 MED ORDER — ACETAMINOPHEN 325 MG PO TABS
650.0000 mg | ORAL_TABLET | Freq: Once | ORAL | Status: AC
  Administered 2015-10-23: 650 mg via ORAL
  Filled 2015-10-23: qty 2

## 2015-10-23 MED ORDER — DIPHENHYDRAMINE HCL 25 MG PO TABS: 25.0000 mg | ORAL_TABLET | Freq: Four times a day (QID) | ORAL | Status: DC

## 2015-10-23 MED ORDER — DIPHENHYDRAMINE HCL 25 MG PO CAPS
25.0000 mg | ORAL_CAPSULE | Freq: Once | ORAL | Status: AC
  Administered 2015-10-23: 25 mg via ORAL
  Filled 2015-10-23: qty 1

## 2015-10-23 MED ORDER — INSULIN ASPART 100 UNIT/ML ~~LOC~~ SOLN
12.0000 [IU] | Freq: Once | SUBCUTANEOUS | Status: AC
  Administered 2015-10-23: 12 [IU] via SUBCUTANEOUS
  Filled 2015-10-23: qty 1

## 2015-10-23 MED ORDER — ACETAMINOPHEN 500 MG PO TABS: 500.0000 mg | ORAL_TABLET | Freq: Four times a day (QID) | ORAL | Status: DC | PRN

## 2015-10-23 NOTE — ED Provider Notes (Signed)
I spoke with Dr. Nicole [facility]red- he wants MR angiogram brain wo contrast and MRI brain wo contrast.  0020 [facility] to r/o anuerysm, thrombosis or any other significant intracranial abnormality. Her headache has returned, she states she is a diabetic, has not eaten and feels like her glucose is dropping. Requests insulin Novolog 12 units and sandwich. Also requests Tylenol and Benadryl again for headache. MRI;s have been ordered.  0200 Patient unable to tolerate MRI. She says she did not know she was claustrophobic but opened her eyes and realized she was. She is refusing any anxiety/sedation medication and said shew ill only do it if we put her to sleep. She has a normal neuro exam and normal vitals signs, low risk. Advised we are unable to put her to sleep for this test and she has elected to sign out AMA. Discussed risks of not having scan and a diagnosis. She voices her understanding in front of Seymour Bars, Therapist, sports. Patient was to go home. Benadryl and Tylenol OTC. Advised to return to [facility] ED (where we have MRI) if she changes her mind,.  Filed Vitals:   10/22/15 1949 10/23/15 0022  BP: 118/91 135/100  Pulse:  94  Temp:  98.4 F (36.9 C)  Resp:  [address], PA-C 10/23/15 [id number]  Everlene Balls, MD 10/23/15 [phone]

## 2015-10-23 NOTE — ED Notes (Signed)
Pt would like to speak to [employer]. When pt was transferred, she was under the impression that she would be taken straight to MRI instead of having to come into ED first

## 2015-10-23 NOTE — ED Notes (Signed)
MRI called, pt unable to complete scan. Pt will be brought back to ED.

## 2015-10-23 NOTE — ED Notes (Signed)
Research officer, trade union at bedside

## 2015-10-23 NOTE — Discharge Instructions (Signed)

## 2015-10-23 NOTE — ED Notes (Signed)
Called MRI stated that they would be coming to get her shortly. Spoke with EDP in regards to pt wanting to eat even though CBG 396. Pt still persistent about getting food even though we advised against it.

## 2015-10-23 NOTE — ED Notes (Signed)
Pt called this RN into room, pt had her sister on the phone wanting to talk to me. She wants to know why the pt was not taken directly back to MRI because that is what [facility] told pt what would happen. Told sister that they were misinformed and explained to her the entire process of what happens once pt is here. Sister wanting to know how much longer until pt is DC. Informed sister that it would be several hours before we know what the scans show (which is what the EDP already told the pt) sister was also told that the pt already knew this information about times. Sandwich was given to pt and she told that the EDP was strongly advising against it.

## 2015-10-23 NOTE — ED Notes (Signed)
Pt to MRI

## 2015-10-23 NOTE — ED Notes (Signed)
Attempted to call MRI for ETA with no answer. Explained that patient will have to wait for MRI testing. She acknowledges, declines meat from sandwich at this time after reporting cbg to Tiffany, PA-C with insulin coverage.

## 2015-10-23 NOTE — ED Notes (Signed)
EDP and this RN at bedside talking with pt, explained the benefits of getting scan, pt states that she needs to be put completely "asleep" to get scan done. EDP informed pt that cant put her completely asleep but we would be able to give pt medicine for anxiety. Pt declined this and states she wants to go home. EDP explained to pt that she would be leaving AMA, explained the benefits of staying and risks of leaving. Pt verbalized understanding.

## 2015-10-24 ENCOUNTER — Ambulatory Visit (HOSPITAL_COMMUNITY)
Admission: RE | Admit: 2015-10-24 | Discharge: 2015-10-24 | Disposition: A | Discharge status: Home / Self Care | Payer: Medicaid Other | Source: Ambulatory Visit | Attending: Obstetrics and Gynecology | Admitting: Obstetrics and Gynecology

## 2015-10-24 ENCOUNTER — Encounter (HOSPITAL_COMMUNITY): Payer: Self-pay

## 2015-10-24 DIAGNOSIS — O09212 Supervision of pregnancy with history of pre-term labor, second trimester: Secondary | ICD-10-CM | POA: Insufficient documentation

## 2015-10-24 DIAGNOSIS — O24012 Pre-existing diabetes mellitus, type 1, in pregnancy, second trimester: Secondary | ICD-10-CM | POA: Insufficient documentation

## 2015-10-24 DIAGNOSIS — O09292 Supervision of pregnancy with other poor reproductive or obstetric history, second trimester: Secondary | ICD-10-CM | POA: Diagnosis not present

## 2015-10-24 DIAGNOSIS — O0992 Supervision of high risk pregnancy, unspecified, second trimester: Secondary | ICD-10-CM

## 2015-10-24 DIAGNOSIS — O34219 Maternal care for unspecified type scar from previous cesarean delivery: Secondary | ICD-10-CM | POA: Insufficient documentation

## 2015-10-24 DIAGNOSIS — O24319 Unspecified pre-existing diabetes mellitus in pregnancy, unspecified trimester: Secondary | ICD-10-CM

## 2015-10-24 DIAGNOSIS — Z3A27 27 weeks gestation of pregnancy: Secondary | ICD-10-CM | POA: Diagnosis not present

## 2015-10-24 DIAGNOSIS — O99332 Smoking (tobacco) complicating pregnancy, second trimester: Secondary | ICD-10-CM | POA: Insufficient documentation

## 2015-10-24 DIAGNOSIS — O24019 Pre-existing diabetes mellitus, type 1, in pregnancy, unspecified trimester: Secondary | ICD-10-CM

## 2015-10-24 NOTE — ED Notes (Signed)
Pt did not bring a copy of her glucose log today.

## 2015-11-06 ENCOUNTER — Ambulatory Visit (INDEPENDENT_AMBULATORY_CARE_PROVIDER_SITE_OTHER): Payer: Medicaid Other | Admitting: Obstetrics and Gynecology

## 2015-11-06 ENCOUNTER — Encounter: Payer: Self-pay | Admitting: Obstetrics and Gynecology

## 2015-11-06 VITALS — BP 145/90 | HR 103 | Wt 144.4 lb

## 2015-11-06 DIAGNOSIS — O34219 Maternal care for unspecified type scar from previous cesarean delivery: Secondary | ICD-10-CM

## 2015-11-06 DIAGNOSIS — O10919 Unspecified pre-existing hypertension complicating pregnancy, unspecified trimester: Secondary | ICD-10-CM | POA: Insufficient documentation

## 2015-11-06 DIAGNOSIS — O10913 Unspecified pre-existing hypertension complicating pregnancy, third trimester: Secondary | ICD-10-CM

## 2015-11-06 DIAGNOSIS — O0993 Supervision of high risk pregnancy, unspecified, third trimester: Secondary | ICD-10-CM

## 2015-11-06 DIAGNOSIS — O24013 Pre-existing diabetes mellitus, type 1, in pregnancy, third trimester: Secondary | ICD-10-CM

## 2015-11-06 DIAGNOSIS — O09893 Supervision of other high risk pregnancies, third trimester: Secondary | ICD-10-CM

## 2015-11-06 DIAGNOSIS — O09213 Supervision of pregnancy with history of pre-term labor, third trimester: Secondary | ICD-10-CM | POA: Diagnosis not present

## 2015-11-06 DIAGNOSIS — Z23 Encounter for immunization: Secondary | ICD-10-CM

## 2015-11-06 LAB — COMPREHENSIVE METABOLIC PANEL
ALT: 6 U/L (ref 6–29)
AST: 13 U/L (ref 10–30)
Albumin: 2.8 g/dL — ABNORMAL LOW (ref 3.6–5.1)
Alkaline Phosphatase: 60 U/L (ref 33–115)
BILIRUBIN TOTAL: 0.3 mg/dL (ref 0.2–1.2)
BUN: 7 mg/dL (ref 7–25)
CHLORIDE: 100 mmol/L (ref 98–110)
CO2: 21 mmol/L (ref 20–31)
CREATININE: 0.64 mg/dL (ref 0.50–1.10)
Calcium: 8.1 mg/dL — ABNORMAL LOW (ref 8.6–10.2)
Glucose, Bld: 133 mg/dL — ABNORMAL HIGH (ref 65–99)
Potassium: 4 mmol/L (ref 3.5–5.3)
SODIUM: 134 mmol/L — AB (ref 135–146)
TOTAL PROTEIN: 5.7 g/dL — AB (ref 6.1–8.1)

## 2015-11-06 LAB — POCT URINALYSIS DIP (DEVICE)
BILIRUBIN URINE: NEGATIVE
GLUCOSE, UA: 500 mg/dL — AB
KETONES UR: 15 mg/dL — AB
LEUKOCYTES UA: NEGATIVE
Nitrite: NEGATIVE
Specific Gravity, Urine: 1.015 (ref 1.005–1.030)
Urobilinogen, UA: 0.2 mg/dL (ref 0.0–1.0)
pH: 7 (ref 5.0–8.0)

## 2015-11-06 LAB — CBC
HCT: 34.3 % — ABNORMAL LOW (ref 35.0–45.0)
Hemoglobin: 11.5 g/dL — ABNORMAL LOW (ref 11.7–15.5)
MCH: 31.1 pg (ref 27.0–33.0)
MCHC: 33.5 g/dL (ref 32.0–36.0)
MCV: 92.7 fL (ref 80.0–100.0)
MPV: 10.4 fL (ref 7.5–12.5)
PLATELETS: 258 10*3/uL (ref 140–400)
RBC: 3.7 MIL/uL — AB (ref 3.80–5.10)
RDW: 13.3 % (ref 11.0–15.0)
WBC: 7.1 10*3/uL (ref 3.8–10.8)

## 2015-11-06 MED ORDER — TETANUS-DIPHTH-ACELL PERTUSSIS 5-2.5-18.5 LF-MCG/0.5 IM SUSP
0.5000 mL | Freq: Once | INTRAMUSCULAR | Status: AC
  Administered 2015-11-06: 0.5 mL via INTRAMUSCULAR

## 2015-11-06 NOTE — Progress Notes (Signed)
28 wk labs today  tdap vaccine given  Pt reports not being able to make any appts for endocrinologist and OB visits

## 2015-11-06 NOTE — Progress Notes (Signed)
Subjective:  Ann Robertson is a 25 y.o. [id number] at [redacted]w[redacted]d being seen today for ongoing prenatal care.  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  Patient reports vaginal irritation.  Contractions: Not present. Vag. Bleeding: None.  Movement: Present. Denies leaking of fluid.   The following portions of the patient's history were reviewed and updated as appropriate: allergies, current medications, past family history, past medical history, past social history, past surgical history and problem list. Problem list updated.  Objective:   Filed Vitals:   11/06/15 1045  BP: 145/90  Pulse: 103  Weight: 144 lb 6.4 oz (65.499 kg)    Fetal Status: Fetal Heart Rate (bpm): 134   Movement: Present     General:  Alert, oriented and cooperative. Patient is in no acute distress.  Skin: Skin is warm and dry. No rash noted.   Cardiovascular: Normal heart rate noted  Respiratory: Normal respiratory effort, no problems with respiration noted  Abdomen: Soft, gravid, appropriate for gestational age. Pain/Pressure: Present     Pelvic: Vag. Bleeding: None     Cervical exam deferred        Extremities: Normal range of motion.  Edema: None  Mental Status: Normal mood and affect. Normal behavior. Normal judgment and thought content.   Urinalysis: Urine Protein: 4+ Urine Glucose: 3+  Assessment and Plan:  Pregnancy: [id number] at  [redacted]w[redacted]d  1. History of preterm delivery, currently pregnant, third trimester Reviewed si/sx of preterm labor - HIV antibody (with reflex) - RPR - CBC  2. Type 1 diabetes mellitus complicating pregnancy, antepartum, third trimester CBGs reviewed on meter and all values are in the 300-544 range. Patient reports taking her insulin but not always because it brings her glucose levels too low. Patient refused admission for glycemic control. Patient has not kept her endocrinology appointments due to lack of transportation. She is aware of medicaid transportation but doesn't like to use it AND often forgets to schedule it on time. Offered to schedule transportation for her as we schedule her follow up appointment but patient declined stating "don't treat me like a child"  3. Previous cesarean section complicating pregnancy Will need repeat with BTL  4. Short interval between pregnancies complicating pregnancy, antepartum, third trimester   5. Supervision of high risk pregnancy, antepartum, third trimester Elevated BP today and on previous visits. Likely CHTN as it was present before 20 weeks. Labs today Patient has not been taking ASA and is not planning on starting - Wet prep, genital - Comprehensive metabolic panel - Protein / creatinine ratio, urine  Preterm labor symptoms and general obstetric precautions including but not limited to vaginal bleeding, contractions, leaking of fluid and fetal movement were reviewed in detail with the patient. Please refer to After Visit Summary for other counseling recommendations.  Return in about 2 days (around 11/08/2015).   Mora Bellman, MD

## 2015-11-07 LAB — HIV ANTIBODY (ROUTINE TESTING W REFLEX): HIV: NONREACTIVE

## 2015-11-07 LAB — WET PREP, GENITAL
Trich, Wet Prep: NONE SEEN
Yeast Wet Prep HPF POC: NONE SEEN

## 2015-11-07 LAB — RPR

## 2015-11-09 MED ORDER — METRONIDAZOLE 500 MG PO TABS: 500.0000 mg | ORAL_TABLET | Freq: Two times a day (BID) | ORAL | Status: DC

## 2015-11-09 NOTE — Addendum Note (Signed)
Addended by: Mora Bellman on: 11/09/2015 09:03 AM   Modules accepted: Orders

## 2015-11-11 ENCOUNTER — Telehealth: Payer: Self-pay | Admitting: General Practice

## 2015-11-11 NOTE — Telephone Encounter (Signed)
Left message for patient to call back regarding surgery.

## 2015-11-11 NOTE — Telephone Encounter (Signed)
Patient has BV and flagyl has been sent to pharmacy. Called patient, no answer- phone rang several times then disconnected.

## 2015-11-11 NOTE — Telephone Encounter (Signed)
Pt returned call received from [place] earlier today. I advised her of +BV and that treatment prescription has been sent to her pharmacy. Pt voiced understanding of information given.

## 2015-11-12 ENCOUNTER — Telehealth: Payer: Self-pay | Admitting: Endocrinology

## 2015-11-12 LAB — PROTEIN / CREATININE RATIO, URINE

## 2015-11-12 NOTE — Telephone Encounter (Signed)
Pt has only been seen here for DM, and she is not disabled for that reason

## 2015-11-12 NOTE — Telephone Encounter (Signed)
Pt is asking for letter to let work know she cannot work anymore due to her condition

## 2015-11-12 NOTE — Telephone Encounter (Signed)
Left message for patient to call back regarding results. Pt has been advised of results and not surgery

## 2015-11-13 NOTE — Telephone Encounter (Signed)
I contacted the pt and advised of note below. Pt stated she did not need a letter, pt stated she has a form from the department of social services and would like for Dr. Loanne Drilling to review and possibly sign. Fax number provided to the pt.

## 2015-11-14 ENCOUNTER — Inpatient Hospital Stay (HOSPITAL_COMMUNITY)
Admission: AD | Admit: 2015-11-14 | Discharge: 2015-11-14 | Disposition: A | Discharge status: Home / Self Care | Payer: Medicaid Other | Source: Ambulatory Visit | Attending: Obstetrics & Gynecology | Admitting: Obstetrics & Gynecology

## 2015-11-14 ENCOUNTER — Encounter (HOSPITAL_COMMUNITY): Payer: Self-pay | Admitting: *Deleted

## 2015-11-14 DIAGNOSIS — T1490 Injury, unspecified: Secondary | ICD-10-CM

## 2015-11-14 DIAGNOSIS — O9A213 Injury, poisoning and certain other consequences of external causes complicating pregnancy, third trimester: Secondary | ICD-10-CM | POA: Diagnosis not present

## 2015-11-14 DIAGNOSIS — O26893 Other specified pregnancy related conditions, third trimester: Secondary | ICD-10-CM | POA: Diagnosis present

## 2015-11-14 DIAGNOSIS — O99333 Smoking (tobacco) complicating pregnancy, third trimester: Secondary | ICD-10-CM | POA: Insufficient documentation

## 2015-11-14 DIAGNOSIS — F17210 Nicotine dependence, cigarettes, uncomplicated: Secondary | ICD-10-CM | POA: Diagnosis not present

## 2015-11-14 DIAGNOSIS — O10013 Pre-existing essential hypertension complicating pregnancy, third trimester: Secondary | ICD-10-CM | POA: Insufficient documentation

## 2015-11-14 DIAGNOSIS — O24013 Pre-existing diabetes mellitus, type 1, in pregnancy, third trimester: Secondary | ICD-10-CM | POA: Insufficient documentation

## 2015-11-14 DIAGNOSIS — W19XXXA Unspecified fall, initial encounter: Secondary | ICD-10-CM

## 2015-11-14 DIAGNOSIS — Z794 Long term (current) use of insulin: Secondary | ICD-10-CM | POA: Insufficient documentation

## 2015-11-14 DIAGNOSIS — O10913 Unspecified pre-existing hypertension complicating pregnancy, third trimester: Secondary | ICD-10-CM

## 2015-11-14 DIAGNOSIS — Z3A30 30 weeks gestation of pregnancy: Secondary | ICD-10-CM

## 2015-11-14 DIAGNOSIS — E109 Type 1 diabetes mellitus without complications: Secondary | ICD-10-CM | POA: Insufficient documentation

## 2015-11-14 MED ORDER — ACETAMINOPHEN 500 MG PO TABS
1000.0000 mg | ORAL_TABLET | Freq: Once | ORAL | Status: AC
  Administered 2015-11-14: 1000 mg via ORAL
  Filled 2015-11-14: qty 2

## 2015-11-14 MED ORDER — ACETAMINOPHEN 500 MG PO TABS: 1000.0000 mg | ORAL_TABLET | Freq: Four times a day (QID) | ORAL | Status: DC | PRN

## 2015-11-14 NOTE — MAU Note (Signed)
Pt left AMA did not wait to sign papers

## 2015-11-14 NOTE — MAU Note (Signed)
Pt was wanting to leave before 4 hr monitoring was finished. Having some difficulty getting a ride at this time and  Pt wanted to get some info on a restraint order. Call out to SANE nurse. Pt agreed to stay on monitor while waiting for her ride.
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
## 2015-11-14 NOTE — Discharge Instructions (Signed)
What Do I Need to Know About Injuries During Pregnancy? Injuries can happen during pregnancy. Minor falls and accidents usually do not harm you or your baby. However, any injury should be reported to your doctor. WHAT CAN I DO TO PROTECT MYSELF FROM INJURIES?  Remove rugs and loose objects on the floor.  Wear comfortable shoes that have a good grip. Do not wear high-heeled shoes.  Always wear your seat belt. The lap belt should be below your belly. Always practice safe driving.  Do not ride on a motorcycle.  Do not participate in high-impact activities or sports.  Avoid:  Walking on wet or slippery floors.  Fires.  Starting fires.  Lifting heavy pots of boiling or hot liquids.  Fixing electrical problems.  Only take medicine as told by your doctor.  Know your blood type and the blood type of the baby's father.  Call your local emergency services (911 in the [place] if you are a victim of domestic violence or assault. For help and support, contact the [employer]. WHEN SHOULD I GET HELP RIGHT AWAY?  You fall on your belly or have any high-impact accident or injury.  You have been a victim of domestic violence or any kind of violence.  You have been in a car accident.  You have bleeding from your vagina.  Fluid is leaking from your vagina.  You start to have belly cramping (contractions) or pain.  You feel weak or pass out (faint).  You start to throw up (vomit) after an injury.  You have been burned.  You have a stiff neck or neck pain.  You get a headache or have vision problems after an injury.  You do not feel the baby move or the baby is not moving as much as normal.   This information is not intended to replace advice given to you by your health care provider. Make sure you discuss any questions you have with your health care provider.   Document Released: 06/19/2010 Document Revised: 06/07/2014 Document Reviewed:  02/21/2013 Elsevier Interactive Patient Education 2016 [employer].   Placental Abruption Your placenta is the organ that nourishes your unborn baby (fetus). Your baby gets his or her blood supply and nutrients through your placenta. It is your baby's life support system. It is attached to the inside of your uterus until after your baby is born.  Placental abruption is when the placenta partly or completely separates from the uterus before your baby is born. This is rare, but it can happen any time after 20 weeks of pregnancy. A small separation may not cause problems, but a large separation may be dangerous for you and your baby. CAUSES  Most of the time the cause of a placental abruption is unknown. Though it is rare, a placental abruption can be caused by:   An abdominal injury.   The baby turning from a buttocks-first position (breech presentation) or a sideways position (transverse) to a headfirst position (cephalic).   Delivering the first of multiple babies (twins, triplets, or more).   Sudden loss of amniotic fluid (premature rupture of the membranes).   An abnormally short umbilical cord. RISK FACTORS Some risk factors make a placental abruption more likely, including:  History of placental abruption.  High blood pressure (hypertension).  Smoking.  Alcohol intake.  Blood clotting problems.  Too much amniotic fluid.  Having had multiples (twins or triplets or more).  Seizures and convulsions.  Diabetes mellitus.  Having had more than  four children.  Age 20 years or older.  Illegal drug use.  Injury to your abdomen. SIGNS AND SYMPTOMS  A small placental abruption may not cause symptoms. If you do have symptoms, they may include:  Mild abdominal pain.  Slight vaginal bleeding. Symptoms of severe placental abruption depend on the size of the separation and the stage of pregnancy. Symptoms may include:   Sudden pain in your uterus.  Abdominal  pain.  Vaginal bleeding.  Tender uterus.  Severe abdominal pain with tenderness.  Continual contractions of your uterus.  Back pain.  Weakness, light-headedness. DIAGNOSIS  Placental abruption is suspected when a pregnant woman develops sudden pain in her uterus. The health care provider will check whether the uterus is very tender, hard, and enlarging and whether the baby has an abnormal heart rate or rhythm. Ultrasonography (commonly called an ultrasound) will be done. Blood work will also be done to make sure that there are enough healthy red blood cells and that there are no clotting problems or signs of too much blood loss. TREATMENT  Placental abruption is usually an emergency. It requires treatment right away. Your treatment will depend on:   The amount of bleeding.  Whether you or you baby are in distress.  The stage of your pregnancy.  The maturity of the baby. Treatment for partial separation of the placenta is bed rest and close observation. You also may need a blood transfusion or to receive fluids through an IV tube. Treatment for complete placental separation is delivery of your baby. You may have a cesarean delivery if your baby is in distress. HOME CARE INSTRUCTIONS   Only take medicines as directed by your health care provider.  Arrange for help at home before and after you deliver the baby, especially if you had a cesarean delivery or lost a lot of blood.  Get plenty of rest and sleep.  Do not have sexual intercourse until your health care provider says it is okay.  Do not use tampons or douche unless your health care provider says it is okay. SEEK MEDICAL CARE IF:  You have light vaginal bleeding or spotting.  You have any type of trauma, such as a fall or jolt during an accident.  You are having trouble avoiding drugs, alcohol, or smoking. SEEK IMMEDIATE MEDICAL CARE IF:  You have vaginal bleeding.  You have abdominal pain.  You have continuous  uterine contractions.  You have a hard, tender uterus.  You do not feel the baby move, or the baby moves very little. MAKE SURE YOU:  Understand these instructions.  Will watch your condition.  Will get help right away if you are not doing well or get worse.   This information is not intended to replace advice given to you by your health care provider. Make sure you discuss any questions you have with your health care provider.   Document Released: 05/17/2005 Document Revised: 05/22/2013 Document Reviewed: 03/09/2013 Elsevier Interactive Patient Education [employer].

## 2015-11-15 DIAGNOSIS — Z9119 Patient's noncompliance with other medical treatment and regimen: Secondary | ICD-10-CM | POA: Insufficient documentation

## 2015-11-15 DIAGNOSIS — Z91199 Patient's noncompliance with other medical treatment and regimen due to unspecified reason: Secondary | ICD-10-CM | POA: Insufficient documentation

## 2015-11-16 ENCOUNTER — Inpatient Hospital Stay (HOSPITAL_COMMUNITY)
Admission: AD | Admit: 2015-11-16 | Discharge: 2015-11-16 | DRG: 774 | Disposition: A | Discharge status: Other Acute Inpatient Hospital | Payer: Medicaid Other | Source: Ambulatory Visit | Attending: Obstetrics and Gynecology | Admitting: Obstetrics and Gynecology

## 2015-11-16 ENCOUNTER — Encounter (HOSPITAL_COMMUNITY): Payer: Self-pay

## 2015-11-16 DIAGNOSIS — O24013 Pre-existing diabetes mellitus, type 1, in pregnancy, third trimester: Secondary | ICD-10-CM | POA: Diagnosis not present

## 2015-11-16 DIAGNOSIS — O99333 Smoking (tobacco) complicating pregnancy, third trimester: Secondary | ICD-10-CM | POA: Diagnosis not present

## 2015-11-16 DIAGNOSIS — Z841 Family history of disorders of kidney and ureter: Secondary | ICD-10-CM

## 2015-11-16 DIAGNOSIS — F17210 Nicotine dependence, cigarettes, uncomplicated: Secondary | ICD-10-CM | POA: Diagnosis not present

## 2015-11-16 DIAGNOSIS — Z8249 Family history of ischemic heart disease and other diseases of the circulatory system: Secondary | ICD-10-CM

## 2015-11-16 DIAGNOSIS — Z823 Family history of stroke: Secondary | ICD-10-CM | POA: Diagnosis not present

## 2015-11-16 DIAGNOSIS — O10013 Pre-existing essential hypertension complicating pregnancy, third trimester: Secondary | ICD-10-CM | POA: Diagnosis present

## 2015-11-16 DIAGNOSIS — O10913 Unspecified pre-existing hypertension complicating pregnancy, third trimester: Secondary | ICD-10-CM

## 2015-11-16 DIAGNOSIS — O34211 Maternal care for low transverse scar from previous cesarean delivery: Secondary | ICD-10-CM | POA: Diagnosis present

## 2015-11-16 DIAGNOSIS — Z3A30 30 weeks gestation of pregnancy: Secondary | ICD-10-CM

## 2015-11-16 DIAGNOSIS — Z833 Family history of diabetes mellitus: Secondary | ICD-10-CM | POA: Diagnosis not present

## 2015-11-16 DIAGNOSIS — E1065 Type 1 diabetes mellitus with hyperglycemia: Secondary | ICD-10-CM | POA: Diagnosis not present

## 2015-11-16 DIAGNOSIS — Z794 Long term (current) use of insulin: Secondary | ICD-10-CM

## 2015-11-16 DIAGNOSIS — R109 Unspecified abdominal pain: Secondary | ICD-10-CM | POA: Diagnosis present

## 2015-11-16 LAB — RPR: RPR: NONREACTIVE

## 2015-11-16 LAB — URINALYSIS, ROUTINE W REFLEX MICROSCOPIC
BILIRUBIN URINE: NEGATIVE
KETONES UR: 15 mg/dL — AB
LEUKOCYTES UA: NEGATIVE
NITRITE: NEGATIVE
PROTEIN: 100 mg/dL — AB
Specific Gravity, Urine: 1.01 (ref 1.005–1.030)
pH: 6.5 (ref 5.0–8.0)

## 2015-11-16 LAB — GLUCOSE, CAPILLARY
GLUCOSE-CAPILLARY: 339 mg/dL — AB (ref 65–99)
GLUCOSE-CAPILLARY: 249 mg/dL — AB (ref 65–99)
GLUCOSE-CAPILLARY: 119 mg/dL — AB (ref 65–99)
Glucose-Capillary: 397 mg/dL — ABNORMAL HIGH (ref 65–99)
Glucose-Capillary: 387 mg/dL — ABNORMAL HIGH (ref 65–99)
Glucose-Capillary: 143 mg/dL — ABNORMAL HIGH (ref 65–99)

## 2015-11-16 LAB — COMPREHENSIVE METABOLIC PANEL
ALT: 9 U/L — AB (ref 14–54)
AST: 19 U/L (ref 15–41)
Albumin: 2.7 g/dL — ABNORMAL LOW (ref 3.5–5.0)
Alkaline Phosphatase: 68 U/L (ref 38–126)
Anion gap: 9 (ref 5–15)
BUN: 9 mg/dL (ref 6–20)
CHLORIDE: 98 mmol/L — AB (ref 101–111)
CO2: 23 mmol/L (ref 22–32)
CREATININE: 0.7 mg/dL (ref 0.44–1.00)
Calcium: 8.6 mg/dL — ABNORMAL LOW (ref 8.9–10.3)
GFR calc Af Amer: >60 mL/min (ref >60)
Glucose, Bld: 363 mg/dL — ABNORMAL HIGH (ref 65–99)
Potassium: 4.9 mmol/L (ref 3.5–5.1)
Sodium: 130 mmol/L — ABNORMAL LOW (ref 135–145)
Total Bilirubin: 1.2 mg/dL (ref 0.3–1.2)
Total Protein: 6.5 g/dL (ref 6.5–8.1)

## 2015-11-16 LAB — CBC
HCT: 34.4 % — ABNORMAL LOW (ref 36.0–46.0)
HEMOGLOBIN: 11.8 g/dL — AB (ref 12.0–15.0)
MCH: 31.1 pg (ref 26.0–34.0)
MCHC: 34.3 g/dL (ref 30.0–36.0)
MCV: 90.8 fL (ref 78.0–100.0)
PLATELETS: 323 10*3/uL (ref 150–400)
RBC: 3.79 MIL/uL — AB (ref 3.87–5.11)
RDW: 12.9 % (ref 11.5–15.5)
WBC: 9.8 10*3/uL (ref 4.0–10.5)

## 2015-11-16 LAB — URINE MICROSCOPIC-ADD ON

## 2015-11-16 LAB — PROTEIN / CREATININE RATIO, URINE
Creatinine, Urine: 31 mg/dL
Protein Creatinine Ratio: 4.39 mg/mg{Cre} — ABNORMAL HIGH (ref 0.00–0.15)
Total Protein, Urine: 136 mg/dL

## 2015-11-16 LAB — FETAL FIBRONECTIN: FETAL FIBRONECTIN: NEGATIVE

## 2015-11-16 LAB — RAPID URINE DRUG SCREEN, HOSP PERFORMED
AMPHETAMINES: NOT DETECTED
Barbiturates: NOT DETECTED
Benzodiazepines: NOT DETECTED
Cocaine: NOT DETECTED
Opiates: NOT DETECTED
Tetrahydrocannabinol: NOT DETECTED

## 2015-11-16 LAB — TYPE AND SCREEN
ABO/RH(D): O POS
ANTIBODY SCREEN: NEGATIVE

## 2015-11-16 MED ORDER — ACETAMINOPHEN 325 MG PO TABS
650.0000 mg | ORAL_TABLET | ORAL | Status: DC | PRN
Start: 2015-11-16 — End: 2015-11-16

## 2015-11-16 MED ORDER — OXYTOCIN BOLUS FROM INFUSION: 500.0000 mL | INTRAVENOUS | Status: DC

## 2015-11-16 MED ORDER — TERBUTALINE SULFATE 1 MG/ML IJ SOLN
0.2500 mg | Freq: Once | INTRAMUSCULAR | Status: AC
Start: 2015-11-16 — End: 2015-11-16
  Administered 2015-11-16: 0.25 mg via SUBCUTANEOUS

## 2015-11-16 MED ORDER — DEXTROSE-NACL 5-0.45 % IV SOLN
INTRAVENOUS | Status: DC
  Administered 2015-11-16: 09:00:00 via INTRAVENOUS

## 2015-11-16 MED ORDER — INSULIN REGULAR BOLUS VIA INFUSION
0.0000 [IU] | Freq: Three times a day (TID) | INTRAVENOUS | Status: DC
  Filled 2015-11-16: qty 10

## 2015-11-16 MED ORDER — TERBUTALINE SULFATE 1 MG/ML IJ SOLN
INTRAMUSCULAR | Status: AC
  Filled 2015-11-16: qty 1

## 2015-11-16 MED ORDER — SODIUM CHLORIDE 0.9 % IV SOLN
2.0000 g | Freq: Once | INTRAVENOUS | Status: AC
  Administered 2015-11-16: 2 g via INTRAVENOUS
  Filled 2015-11-16: qty 2000

## 2015-11-16 MED ORDER — NIFEDIPINE 10 MG PO CAPS
10.0000 mg | ORAL_CAPSULE | Freq: Two times a day (BID) | ORAL | Status: DC
  Filled 2015-11-16: qty 1

## 2015-11-16 MED ORDER — SODIUM CHLORIDE 0.9 % IV SOLN
INTRAVENOUS | Status: DC
  Administered 2015-11-16: 06:00:00 via INTRAVENOUS

## 2015-11-16 MED ORDER — SODIUM CHLORIDE 0.9 % IV BOLUS (SEPSIS): 1500.0000 mL | Freq: Once | INTRAVENOUS | Status: DC

## 2015-11-16 MED ORDER — MAGNESIUM SULFATE 50 % IJ SOLN
2.0000 g/h | INTRAVENOUS | Status: DC
  Administered 2015-11-16: 2 g/h via INTRAVENOUS
  Filled 2015-11-16: qty 80

## 2015-11-16 MED ORDER — OXYTOCIN 40 UNITS IN LACTATED RINGERS INFUSION - SIMPLE MED: 2.5000 [IU]/h | INTRAVENOUS | Status: DC

## 2015-11-16 MED ORDER — INSULIN REGULAR HUMAN 100 UNIT/ML IJ SOLN
10.0000 [IU] | Freq: Once | INTRAMUSCULAR | Status: AC
  Administered 2015-11-16: 10 [IU] via SUBCUTANEOUS
  Filled 2015-11-16: qty 1

## 2015-11-16 MED ORDER — NIFEDIPINE 10 MG PO CAPS: 10.0000 mg | ORAL_CAPSULE | Freq: Four times a day (QID) | ORAL | Status: DC

## 2015-11-16 MED ORDER — DEXTROSE 50 % IV SOLN: 25.0000 mL | INTRAVENOUS | Status: DC | PRN

## 2015-11-16 MED ORDER — SOD CITRATE-CITRIC ACID 500-334 MG/5ML PO SOLN: 30.0000 mL | ORAL | Status: DC | PRN

## 2015-11-16 MED ORDER — BETAMETHASONE SOD PHOS & ACET 6 (3-3) MG/ML IJ SUSP
12.0000 mg | Freq: Once | INTRAMUSCULAR | Status: AC
  Administered 2015-11-16: 12 mg via INTRAMUSCULAR
  Filled 2015-11-16: qty 2

## 2015-11-16 MED ORDER — ONDANSETRON HCL 4 MG/2ML IJ SOLN: 4.0000 mg | Freq: Four times a day (QID) | INTRAMUSCULAR | Status: DC | PRN

## 2015-11-16 MED ORDER — NIFEDIPINE 10 MG PO CAPS
10.0000 mg | ORAL_CAPSULE | ORAL | Status: DC | PRN
  Administered 2015-11-16: 10 mg via ORAL

## 2015-11-16 MED ORDER — SODIUM CHLORIDE 0.9 % IV SOLN
INTRAVENOUS | Status: DC
  Administered 2015-11-16: 3.4 [IU]/h via INTRAVENOUS
  Filled 2015-11-16: qty 2.5

## 2015-11-16 MED ORDER — LACTATED RINGERS IV BOLUS (SEPSIS)
1000.0000 mL | Freq: Once | INTRAVENOUS | Status: AC
  Administered 2015-11-16: 1000 mL via INTRAVENOUS

## 2015-11-16 MED ORDER — MAGNESIUM SULFATE BOLUS VIA INFUSION
4.0000 g | Freq: Once | INTRAVENOUS | Status: AC
  Administered 2015-11-16: 4 g via INTRAVENOUS
  Filled 2015-11-16: qty 500

## 2015-11-16 MED ORDER — LIDOCAINE HCL (PF) 1 % IJ SOLN: 30.0000 mL | INTRAMUSCULAR | Status: DC | PRN

## 2015-11-16 NOTE — MAU Note (Addendum)
Contractions since 4 am . No leaking . No bleeding. Baby moving well. Came in by EMS

## 2015-11-16 NOTE — Progress Notes (Signed)
Pt given 3 doses of Procardia 10 mg at 0720, 0740,0800 am.  Unable to document in med rec
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
## 2015-11-16 NOTE — Progress Notes (Signed)
Ann Robertson is a 25 y.o. [id number] at [redacted]w[redacted]d  admitted for Preterm labor, DM type 1, noncompliant, CHTN, PRIOR CESAREAN X 3, DESIRES TUBAL.   Subjective: the patient has reduced discomfort since terbutalene and Mag Sulfate were initiated, and repeat cervical exam by same provider is showing unchanged cervical exam.  Pt desires permanent sterilization and alleges that she has signed tubal requests. Records will be checked for a copy of consent.    The NICU is full here, so transfer is necessary. And Dr Gus Height [phone]  at [facility] has been contacted, and bed availability confirmed, and pt will be accepted in transfer to L & D at [facility].   Pt informed of transfer plans and agrees to transfer.   Objective: BP 147/91 mmHg  Pulse 110  Temp(Src) 97.6 F (36.4 C) (Oral)  Resp 18  Ht 5\' 4"  (1.626 m)  Wt 65.318 kg (144 lb)  BMI 24.71 kg/m2  LMP 03/15/2015   Total I/O In: 443.8 [P.O.:100; I.V.:293.8; IV Piggyback:50] Out: 700 [Urine:700]  FHT:  FHR: 145 bpm, variability: moderate,  accelerations:  Present,  decelerations:  Absent UC:   irregular, every 3-5 minutes SVE:   Dilation: 3 Effacement (%): 60 Exam by:: Ernestina Patches, MD -3 station or higher. Labs: Lab Results  Component Value Date   WBC 9.8 11/16/2015   HGB 11.8* 11/16/2015   HCT 34.4* 11/16/2015   MCV 90.8 11/16/2015   PLT 323 11/16/2015    Assessment / Plan: preterm labor 30w 4 d, Type I DM with poor control,  now on glucose stabilizer, prior cesarean x 3 for repeat cesarean and tubal ligation. Chronic HTN ,   Labor: preterm labor persists, diminished intensity, without cervical change since admit, Preeclampsia:  on Mag for tocolysis and neuroprotection Fetal Wellbeing:  Category I Pain Control:   I/D:   Anticipated MOD:  repeat cesarean and tubal ligation   will complete transfer arrangements, and pt to be transported to [facility].  [name] 11/16/2015, 9:42 AM

## 2015-11-16 NOTE — Progress Notes (Signed)
Patient ID: Layli Swilley, female   DOB: 01-Aug-1990, 25 y.o.   MRN: [id number]  Labor Progress Note Cecile Macadangdang is a 25 y.o. [id number] at [redacted]w[redacted]d presented for preterm labor   S: improved contractions, though still having but less intense.   O:  BP 134/83 mmHg  Pulse 116  Temp(Src) 97.6 F (36.4 C) (Oral)  Resp 20  Ht 5\' 4"  (1.626 m)  Wt 144 lb (65.318 kg)  BMI 24.71 kg/m2  LMP 03/15/2015 EFM: 120/mod/+accels, no decels  CVE: Dilation: 3 Effacement (%): 60 Cervical Position: Posterior Exam by:: Ernestina Patches, MD Same as prior  A&P: 25 y.o. [id number] [redacted]w[redacted]d here with preterm  #Preterm labor: no cervical change in > 2 hours. Magnesium started for ICH prophylaxsis and tocolysis.  #GBS unk- Ampicillin  Caren Macadam, MD 9:17 AM

## 2015-11-16 NOTE — MAU Provider Note (Signed)
History     CSN: [id number]  Arrival date and time: 11/16/15 [id number]   First Provider Initiated Contact with Patient 11/16/15 [phone]        Chief Complaint  Patient presents with  . Contractions   HPI [name] is a 25 y.o. [id number] at [redacted]w[redacted]d who presents to MAU today by EMS with complaint of contractions. The patient denies vaginal bleeding or LOF. She states frequent increasingly painful contractions, although she has not been timing them. Contractions started last night. She is a type 1 DM and has CHTN both uncontrolled. She is a patient of the [facility], but appears to have only had 2 visits for prenatal care since March. At her last visit it was noted that CBGs from home range from 300-544 on average and patient refused admission for glycemic control at that time. She has had 3 previous C/S and all preterm deliveries.   OB History    Gravida Para Term Preterm AB TAB SAB Ectopic Multiple Living   5 3 0 3 1 0 1 0 0 3       Past Medical History  Diagnosis Date  . Diabetes mellitus   . Juvenile diabetes mellitus   . Juvenile diabetes 2000  . Neuropathy ([place])   . High cholesterol   . HSV-2 infection   . STD (sexually transmitted disease)   . Preeclampsia     Past Surgical History  Procedure Laterality Date  . Cesarean section    . Wisdom tooth extraction    . Cesarean section N/A 07/05/2013    Procedure: CESAREAN SECTION;  Surgeon: Emily Filbert, MD;  Location: [facility];  Service: Obstetrics;  Laterality: N/A;  . Cesarean section N/A 07/25/2014    Procedure: CESAREAN SECTION;  Surgeon: Donnamae Jude, MD;  Location: [facility];  Service: Obstetrics;  Laterality: N/A;    Family History  Problem Relation Age of Onset  . Stroke Mother   . Hypertension Mother   . Heart disease Mother   . Kidney disease Mother   . Hyperlipidemia Father   . Diabetes Father     Social History  Substance Use Topics  . Smoking status: Current Every Day Smoker -- 0.25 packs/day    Types: Cigarettes   . Smokeless tobacco: Never Used  . Alcohol Use: No    Allergies:  Allergies  Allergen Reactions  . Hydromorphone Hcl Itching and Rash    Prescriptions prior to admission  Medication Sig Dispense Refill Last Dose  . insulin aspart (NOVOLOG) 100 UNIT/ML injection Inject 12 Units into the skin 2 (two) times daily.   11/15/2015 at Unknown time  . acetaminophen (TYLENOL) 500 MG tablet Take 2 tablets (1,000 mg total) by mouth every 6 (six) hours as needed for moderate pain or headache. 30 tablet 0   . diphenhydrAMINE (BENADRYL) 25 MG tablet Take 1 tablet (25 mg total) by mouth every 6 (six) hours. (Patient taking differently: Take 25 mg by mouth every 4 (four) hours as needed (pain). ) 20 tablet 0 Past Month at Unknown time  . metroNIDAZOLE (FLAGYL) 500 MG tablet Take 1 tablet (500 mg total) by mouth 2 (two) times daily. (Patient not taking: Reported on 11/14/2015) 14 tablet 0     Review of Systems  Constitutional: Negative for fever and malaise/fatigue.  Gastrointestinal: Positive for abdominal pain.  Genitourinary:       Neg -vaginal bleeding, discharge, LOF   Physical Exam   Blood pressure 147/103, pulse 98, temperature 98.1 F (36.7 C),  temperature source Oral, resp. rate 20, last menstrual period 03/15/2015, not currently breastfeeding.  Physical Exam  Nursing note and vitals reviewed. Constitutional: She is oriented to person, place, and time. She appears well-developed and well-nourished. No distress.  HENT:  Head: Normocephalic and atraumatic.  Cardiovascular: Normal rate.   Respiratory: Effort normal.  GI: Soft. She exhibits no distension and no mass. There is no tenderness. There is no rebound and no guarding.  Neurological: She is alert and oriented to person, place, and time.  Skin: Skin is warm and dry. No erythema.  Psychiatric: She has a normal mood and affect.    Results for orders placed or performed during the hospital encounter of 11/16/15 (from the past 24  hour(s))  Urinalysis, Routine w reflex microscopic (not at [facility])     Status: Abnormal   Collection Time: 11/16/15  6:08 AM  Result Value Ref Range   Color, Urine YELLOW YELLOW   APPearance CLEAR CLEAR   Specific Gravity, Urine 1.010 1.005 - 1.030   pH 6.5 5.0 - 8.0   Glucose, UA >1000 (A) NEGATIVE mg/dL   Hgb urine dipstick SMALL (A) NEGATIVE   Bilirubin Urine NEGATIVE NEGATIVE   Ketones, ur 15 (A) NEGATIVE mg/dL   Protein, ur 100 (A) NEGATIVE mg/dL   Nitrite NEGATIVE NEGATIVE   Leukocytes, UA NEGATIVE NEGATIVE  Urine rapid drug screen (hosp performed)     Status: None   Collection Time: 11/16/15  6:08 AM  Result Value Ref Range   Opiates NONE DETECTED NONE DETECTED   Cocaine NONE DETECTED NONE DETECTED   Benzodiazepines NONE DETECTED NONE DETECTED   Amphetamines NONE DETECTED NONE DETECTED   Tetrahydrocannabinol NONE DETECTED NONE DETECTED   Barbiturates NONE DETECTED NONE DETECTED  Protein / creatinine ratio, urine     Status: Abnormal   Collection Time: 11/16/15  6:08 AM  Result Value Ref Range   Creatinine, Urine 31.00 mg/dL   Total Protein, Urine 136 mg/dL   Protein Creatinine Ratio 4.39 (H) 0.00 - 0.15 mg/mg[Cre]  Urine microscopic-add on     Status: Abnormal   Collection Time: 11/16/15  6:08 AM  Result Value Ref Range   Squamous Epithelial / LPF 6-30 (A) NONE SEEN   WBC, UA 6-30 0 - 5 WBC/hpf   RBC / HPF 0-5 0 - 5 RBC/hpf   Bacteria, UA FEW (A) NONE SEEN  Glucose, capillary     Status: Abnormal   Collection Time: 11/16/15  6:15 AM  Result Value Ref Range   Glucose-Capillary 339 (H) 65 - 99 mg/dL  CBC     Status: Abnormal   Collection Time: 11/16/15  6:45 AM  Result Value Ref Range   WBC 9.8 4.0 - 10.5 K/uL   RBC 3.79 (L) 3.87 - 5.11 MIL/uL   Hemoglobin 11.8 (L) 12.0 - 15.0 g/dL   HCT 34.4 (L) 36.0 - 46.0 %   MCV 90.8 78.0 - 100.0 fL   MCH 31.1 26.0 - 34.0 pg   MCHC 34.3 30.0 - 36.0 g/dL   RDW 12.9 11.5 - 15.5 %   Platelets 323 150 - 400 K/uL   Fetal  Monitoring: Baseline: 120 bpm Variability: moderate Accelerations: 15 x 15 Decelerations: variables Contractions: q 2-4 minutes  Patient Vitals for the past 24 hrs:  BP Temp Temp src Pulse Resp  11/16/15 0609 (!) 147/103 mmHg - - 98 -  11/16/15 0559 (!) 150/110 mmHg 98.1 F (36.7 C) Oral 102 20    MAU Course  Procedures None  MDM CBG, CBC, CMP, UA, UDS, Urine protein/creatinine ratio today  Collected FFN and attempted to check cervix as patient appeared very uncomfortable with frequent contractions. Patient did not tolerate exam well and posterior cervix was not reached for adequate exam.  Discussed patient with Dr. Ilda Basset. He recommends 0.25 mg SQ Terbutaline for tocolysis to attempt better pain control and re-attempt cervical exam.  0.25 mg Terbutaline given SQ. Report given to Dr. Ernestina Patches who will resume care and attempt cervical exam.   Luvenia Redden, PA-C  11/16/2015, 7:02 AM  Assessment and Plan  A: SIUP at [redacted]w[redacted]d Type 1 DM, uncontrolled CHTN, uncontrolled Preterm contractions, third trimester

## 2015-11-16 NOTE — H&P (Signed)
Obstetrics Admission History & Physical  11/16/2015 - 7:47 AM Primary OBGYN: [facility]  Chief Complaint: abdominal pain History of Present Illness  25 y.o. [id number] @ [redacted]w[redacted]d, with the above CC. Pregnancy complicated by: poorly controlled and non compliant DM1, cHTN, h/o c-section x 3, close pregnancy 07/2014, h/o PTB x 3  0700 SVE 3cm. She has been seeing Dr. Loanne Drilling but has no show'ed to visits with the last one in Jan 2017  Review of Systems: her 12 point review of systems is negative or as noted in the History of Present Illness.  PMHx:  Past Medical History  Diagnosis Date  . Diabetes mellitus   . Juvenile diabetes mellitus   . Juvenile diabetes 2000  . Neuropathy ([place])   . High cholesterol   . HSV-2 infection   . STD (sexually transmitted disease)   . Preeclampsia    PSHx:  Past Surgical History  Procedure Laterality Date  . Cesarean section    . Wisdom tooth extraction    . Cesarean section N/A 07/05/2013    Procedure: CESAREAN SECTION;  Surgeon: Emily Filbert, MD;  Location: [facility];  Service: Obstetrics;  Laterality: N/A;  . Cesarean section N/A 07/25/2014    Procedure: CESAREAN SECTION;  Surgeon: Donnamae Jude, MD;  Location: [facility];  Service: Obstetrics;  Laterality: N/A;   Medications: Pt is unsure of what she takes bc v-go pump but is unsure how much it gives her daily and when she doesn't wear it she gives herself short acting 12 units bid.    Allergies: is allergic to hydromorphone hcl. OBHx:  OB History  Gravida Para Term Preterm AB SAB TAB Ectopic Multiple Living  5 3 0 3 1 1 0 0 0 3     # Outcome Date GA Lbr Len/2nd Weight Sex Delivery Anes PTL Lv  5 Current           4 Preterm 07/25/14 [redacted]w[redacted]d  7 lb 3.7 oz (3.28 kg) F CS-LTranv Spinal  Y  3 Preterm 07/05/13 [redacted]w[redacted]d  6 lb 7.9 oz (2.945 kg) M CS-Vac Spinal  Y  2 SAB 2014          1 Preterm 04/17/06 [redacted]w[redacted]d 14:00 5 lb 14 oz (2.665 kg) M CS-Unspec Spinal Y Y     Comments: failure to progess; type 1 diabetes         FHx:  Family History  Problem Relation Age of Onset  . Stroke Mother   . Hypertension Mother   . Heart disease Mother   . Kidney disease Mother   . Hyperlipidemia Father   . Diabetes Father    Soc Hx:  Social History   Social History  . Marital Status: Single    Spouse Name: N/A  . Number of Children: N/A  . Years of Education: N/A   Occupational History  . Not on file.   Social History Main Topics  . Smoking status: Current Every Day Smoker -- 0.25 packs/day    Types: Cigarettes  . Smokeless tobacco: Never Used  . Alcohol Use: No  . Drug Use: No  . Sexual Activity: Not on file   Other Topics Concern  . Not on file   Social History Narrative    Objective   Filed Vitals:   11/16/15 0656 11/16/15 0715  BP: 150/97 133/94  Pulse: 93 105  Temp:    Resp:      Current Vital Signs 24h Vital Sign Ranges  T 98.1 F (36.7 C) Temp  Avg: 98.1 F (36.7 C)  Min: 98.1 F (36.7 C)  Max: 98.1 F (36.7 C)  BP 133/94 mmHg BP  Min: 133/94  Max: 150/97  HR 105 Pulse  Avg: 99.8  Min: 93  Max: 105  RR 20 Resp  Avg: 20  Min: 20  Max: 20  SaO2     No Data Recorded       24 Hour I/O Current Shift I/O  Time Ins Outs       EFM: 125 baseline,  +accels, ?variables vs decel vs maternal artificat, mod var Toco: q45m initially and now irregular UCs, +irritability  General: Well nourished, well developed female in no acute distress.  Skin:  Warm and dry.  Cardiovascular: S1, S2 normal, no murmur, rub or gallop, regular rate and rhythm Respiratory:  Clear to auscultation bilateral. Normal respiratory effort Abdomen: gravid, nttp Neuro/Psych:  Normal mood and affect.   3/60 @ 0700  Labs  Negative: UDS   Recent Labs Lab 11/16/15 0645  WBC 9.8  HGB 11.8*  HCT 34.4*  PLT 323     Recent Labs Lab 11/16/15 0645  NA 130*  K 4.9  CL 98*  CO2 23  BUN 9  CREATININE 0.70  CALCIUM 8.6*  PROT 6.5  BILITOT 1.2  ALKPHOS 68  ALT 9*  AST 19  GLUCOSE 363*   Results  for Ann, Robertson (MRN [id number]) as of 11/16/2015 07:57  Ref. Range 11/16/2015 06:08  Appearance Latest Ref Range: CLEAR  CLEAR  Bacteria, UA Latest Ref Range: NONE SEEN  FEW (A)  Bilirubin Urine Latest Ref Range: NEGATIVE  NEGATIVE  Color, Urine Latest Ref Range: YELLOW  YELLOW  Glucose Latest Ref Range: NEGATIVE mg/dL >1000 (A)  Hgb urine dipstick Latest Ref Range: NEGATIVE  SMALL (A)  Ketones, ur Latest Ref Range: NEGATIVE mg/dL 15 (A)  Leukocytes, UA Latest Ref Range: NEGATIVE  NEGATIVE  Nitrite Latest Ref Range: NEGATIVE  NEGATIVE  pH Latest Ref Range: 5.0-8.0  6.5  Protein Latest Ref Range: NEGATIVE mg/dL 100 (A)  RBC / HPF Latest Ref Range: 0-5 RBC/hpf 0-5  Specific Gravity, Urine Latest Ref Range: 1.005-1.030  1.010  Squamous Epithelial / LPF Latest Ref Range: NONE SEEN  6-30 (A)  WBC, UA Latest Ref Range: 0-5 WBC/hpf 6-30   Radiology none  Perinatal info   ABO, Rh: O/Positive/-- (01/06 0000) Antibody: Negative (01/06 0000) Rubella: Immune (01/06 0000) RPR: NON REAC (06/08 1125)  HBsAg: Negative (01/06 0000)  HIV: NONREACTIVE (06/08 1125)  GBS: unknown  Assessment & Plan   25 y.o. [id number] @ [redacted]w[redacted]d with possible PTL and elevated BS *IUP: category I currently. *PTL: pt states UCs have spaced out and less uncomfortable after dose of terbutaline, procardia PO and just started Mg. D/w pt that could be due to poorly controlled sugars (no e/o UTI) so will see how she progresses with BS under control and tocolysis. NICU is close to full so may need to transfer  -BMZ ordered *DM1: IV lines started, NS bolus and doesn't appear to be in DKA and start on insulin gtt after 10units regular ordered.  [facility] consulted and they agree and will see her later in the day *h/o c-section x 3: will need repeat if changes *FEN: NPO, NS bolus and MIVF *GBS: unknown. Started on amp *cHTN: no e/o pre-eclampsia. Labs negative except for expected baseline proteinuria.  *Analgesia:  no needs *Dispo: will need stabilization before consideration of transfer. If laboring, will need section. If  not laboring and BS not controlled, can't transfer but if both are controlled then can consider transfer vs keeping in house.   Durene Romans MD Attending Center for [facility])

## 2015-11-16 NOTE — Progress Notes (Signed)
Patient ID: Ann Robertson, female   DOB: [birth year], 25 y.o.   MRN: [id number] Please refer to info in progress note of 9:42 am re this pt.  Pt accepts transfer.  Dating u/s was in January,  06/27/15  CLINICAL DATA: Uncertain dates. Diabetes mellitus. Quantitative beta HCG on 05/22/2015 was 1,346. Gravida 5 para 3 AB 1.  EXAM: OBSTETRIC <14 WK ULTRASOUND  TECHNIQUE: Transabdominal ultrasound was performed for evaluation of the gestation as well as the maternal uterus and adnexal regions.  COMPARISON: None applicable  FINDINGS: Intrauterine gestational sac: Visualized/normal in shape.  Yolk sac: Present  Embryo: Present  Cardiac Activity: Present  Heart Rate: 164 bpm  CRL: 33.8 Mm 10 w 2 d [place] EDC: 01/21/2016  Subchorionic hemorrhage: None  Maternal uterus/adnexae: Right corpus luteum cyst is 1.9 cm. Left ovary has normal appearance. No free pelvic fluid.  IMPRESSION: 1. Single living intrauterine embryo measuring 10 weeks 2 days. 2. By today's exam, EDC is 01/21/2016.   Electronically Signed  By: Nolon Nations M.D.  On: 06/27/2015 10:28    Pt will be transferred to [facility] for continued tocolysis, betamethasone, glucose stabilization, and , if necessary delivery.

## 2015-11-18 ENCOUNTER — Ambulatory Visit: Payer: Medicaid Other | Admitting: Endocrinology

## 2015-11-18 DIAGNOSIS — Z0289 Encounter for other administrative examinations: Secondary | ICD-10-CM

## 2015-11-19 ENCOUNTER — Telehealth: Payer: Self-pay

## 2015-11-19 ENCOUNTER — Encounter (HOSPITAL_COMMUNITY): Payer: Self-pay

## 2015-11-19 ENCOUNTER — Telehealth: Payer: Self-pay | Admitting: Endocrinology

## 2015-11-19 ENCOUNTER — Inpatient Hospital Stay (EMERGENCY_DEPARTMENT_HOSPITAL)
Admission: AD | Admit: 2015-11-19 | Discharge: 2015-11-20 | Disposition: A | Discharge status: Home / Self Care | Payer: Medicaid Other | Source: Ambulatory Visit | Attending: Obstetrics & Gynecology | Admitting: Obstetrics & Gynecology

## 2015-11-19 DIAGNOSIS — O479 False labor, unspecified: Secondary | ICD-10-CM

## 2015-11-19 LAB — URINALYSIS, ROUTINE W REFLEX MICROSCOPIC
BILIRUBIN URINE: NEGATIVE
Glucose, UA: NEGATIVE mg/dL
KETONES UR: NEGATIVE mg/dL
NITRITE: NEGATIVE
Protein, ur: 30 mg/dL — AB
Specific Gravity, Urine: 1.015 (ref 1.005–1.030)
pH: 6 (ref 5.0–8.0)

## 2015-11-19 LAB — URINE MICROSCOPIC-ADD ON

## 2015-11-19 MED ORDER — NIFEDIPINE 10 MG PO CAPS
10.0000 mg | ORAL_CAPSULE | ORAL | Status: DC | PRN
  Administered 2015-11-19 – 2015-11-20 (×3): 10 mg via ORAL
  Filled 2015-11-19 (×3): qty 1

## 2015-11-19 NOTE — Telephone Encounter (Signed)
Patient asked have you received a fax Form for [employer] yet, she stated she need it filled out and sent in by July 1st, please advised

## 2015-11-19 NOTE — Telephone Encounter (Signed)
I contacted the pt and advised we have received the forms. Pt was advised Dr. Loanne Drilling is currently out of town and will not return until July 3rd. Pt was advised we would have to wait until Dr. Loanne Drilling returned to complete the paper work. Pt stated she would contact her case manager to see what else could be done because she cannot wait until Dr. Loanne Drilling returns to have the paper work completed.

## 2015-11-19 NOTE — Telephone Encounter (Signed)
FMLA Paper has been completed and feel out.

## 2015-11-19 NOTE — MAU Note (Signed)
Pt c/o contractions all day. Denies LOF. Having some bloody show. Was 4cm on Sunday. +FM

## 2015-11-20 ENCOUNTER — Telehealth: Payer: Self-pay

## 2015-11-20 ENCOUNTER — Encounter: Payer: Medicaid Other | Admitting: Obstetrics & Gynecology

## 2015-11-20 DIAGNOSIS — Z3A31 31 weeks gestation of pregnancy: Secondary | ICD-10-CM | POA: Diagnosis not present

## 2015-11-20 DIAGNOSIS — O4703 False labor before 37 completed weeks of gestation, third trimester: Secondary | ICD-10-CM | POA: Diagnosis not present

## 2015-11-20 MED ORDER — INSULIN ASPART 100 UNIT/ML ~~LOC~~ SOLN: SUBCUTANEOUS | Status: DC

## 2015-11-20 MED ORDER — NIFEDIPINE ER OSMOTIC RELEASE 30 MG PO TB24: 30.0000 mg | ORAL_TABLET | Freq: Two times a day (BID) | ORAL | Status: DC

## 2015-11-20 NOTE — Telephone Encounter (Signed)
Rx submitted and pt notified.

## 2015-11-20 NOTE — Telephone Encounter (Signed)
The V-go pump has a 56 units capacity.  Please send the prescription accordingly

## 2015-11-20 NOTE — Telephone Encounter (Signed)
Pt called stating she is completely out of her insulin ( novolog) Pt was recently discharged from the hospital and was put on the v-go 20. Pt could not advise me how may units she is using a day. She stated she use about a whole vial of novolog per day with [id number] extra clicks per day. Pt stated since she is using the extra clicks she is running out of her insulin faster and the pharmacy will not refill without a updated prescription. Please advise during Dr. Cordelia Pen absence. Thanks!

## 2015-11-20 NOTE — MAU Provider Note (Signed)
History     CSN: [id number]  Arrival date and time: 11/19/15 2248   None     Chief Complaint  Patient presents with  . Contractions   HPI Ann Robertson s a 25yo [id number] @ 31.1wks who presents via EMS for eval of contractions that were keeping her from sleeping. Reports sm bloody show since the 18th; denies leaking. Was seen on the 18th in MAU and then tx to [facility] for admission due to preterm labor as our NICU was full. She left AMA on the 19th. States the ctx this evening are not as uncomfortable as the ones she had over the weekend. She is a pt of [facility] but has sporadic care; her preg has been remarkable for 1) Type 1 DM 2) cHTN 3) prev C/S x 3 of all preterm births.  OB History    Gravida Para Term Preterm AB TAB SAB Ectopic Multiple Living   5 3 0 3 1 0 1 0 0 3       Past Medical History  Diagnosis Date  . Diabetes mellitus   . Juvenile diabetes mellitus   . Juvenile diabetes 2000  . Neuropathy ([place])   . High cholesterol   . HSV-2 infection   . STD (sexually transmitted disease)   . Preeclampsia     Past Surgical History  Procedure Laterality Date  . Cesarean section    . Wisdom tooth extraction    . Cesarean section N/A 07/05/2013    Procedure: CESAREAN SECTION;  Surgeon: Emily Filbert, MD;  Location: [facility];  Service: Obstetrics;  Laterality: N/A;  . Cesarean section N/A 07/25/2014    Procedure: CESAREAN SECTION;  Surgeon: Donnamae Jude, MD;  Location: [facility];  Service: Obstetrics;  Laterality: N/A;    Family History  Problem Relation Age of Onset  . Stroke Mother   . Hypertension Mother   . Heart disease Mother   . Kidney disease Mother   . Hyperlipidemia Father   . Diabetes Father     Social History  Substance Use Topics  . Smoking status: Current Every Day Smoker -- 0.25 packs/day    Types: Cigarettes  . Smokeless tobacco: Never Used  . Alcohol Use: No    Allergies:  Allergies  Allergen Reactions  . Hydromorphone Hcl Itching and Rash    Prescriptions  prior to admission  Medication Sig Dispense Refill Last Dose  . acetaminophen (TYLENOL) 500 MG tablet Take 2 tablets (1,000 mg total) by mouth every 6 (six) hours as needed for moderate pain or headache. (Patient not taking: Reported on 11/16/2015) 30 tablet 0   . diphenhydrAMINE (BENADRYL) 25 MG tablet Take 1 tablet (25 mg total) by mouth every 6 (six) hours. (Patient not taking: Reported on 11/16/2015) 20 tablet 0 Past Month at Unknown time  . insulin aspart (NOVOLOG) 100 UNIT/ML injection Inject 12 Units into the skin 2 (two) times daily.   11/15/2015 at Unknown time  . metroNIDAZOLE (FLAGYL) 500 MG tablet Take 1 tablet (500 mg total) by mouth 2 (two) times daily. (Patient not taking: Reported on 11/14/2015) 14 tablet 0     ROS No other pertinents other than what is in HPI Physical Exam   Blood pressure 135/89, pulse 106, temperature 97.9 F (36.6 C), temperature source Oral, resp. rate 18, last menstrual period 03/15/2015, not currently breastfeeding. BPs: 129/87, 138/94, 128/76, 160/96  Physical Exam  Constitutional: She is oriented to person, place, and time. She appears well-developed.  HENT:  Head: Normocephalic.  Neck: Normal range of motion.  Cardiovascular:  Sl tachycardic  Respiratory: Effort normal.  GI:  EFM 130s, +accels, no decels Ctx irreg, but after Procardia only occ irritability  Genitourinary: Vagina normal.  Cx 3/80/-2, unchanged on exams x 2  Musculoskeletal: Normal range of motion.  Neurological: She is alert and oriented to person, place, and time.  Skin: Skin is warm and dry.  Psychiatric: She has a normal mood and affect. Her behavior is normal. Thought content normal.   Urinalysis    Component Value Date/Time   COLORURINE YELLOW 11/19/2015 [place] 11/19/2015 2254   LABSPEC 1.015 11/19/2015 2254   PHURINE 6.0 11/19/2015 2254   GLUCOSEU NEGATIVE 11/19/2015 2254   HGBUR MODERATE* 11/19/2015 2254   BILIRUBINUR NEGATIVE 11/19/2015 2254    KETONESUR NEGATIVE 11/19/2015 2254   PROTEINUR 30* 11/19/2015 2254   UROBILINOGEN 0.2 11/06/2015 1011   NITRITE NEGATIVE 11/19/2015 2254   LEUKOCYTESUR TRACE* 11/19/2015 2254     MAU Course  Procedures  MDM UA ordered Procardia 10mg  x 3 given  Assessment and Plan  IUP@31 .1wks Preterm ctx without cx change Type 1 DM cHTN  D/C home with preterm labor precautions Rx Procardia XL 30mg  BID Has clinic appt in AM which she cannot make; instructed to call clinic in the morning to cancel and reschedule for within 1 week  SHAW, [place] 11/20/2015, 12:39 AM

## 2015-11-20 NOTE — Discharge Instructions (Signed)

## 2015-11-21 ENCOUNTER — Encounter (HOSPITAL_COMMUNITY)
Admission: AD | Disposition: A | Discharge status: Home / Self Care | Payer: Self-pay | Source: Ambulatory Visit | Attending: Family Medicine

## 2015-11-21 ENCOUNTER — Encounter (HOSPITAL_COMMUNITY): Payer: Self-pay | Admitting: *Deleted

## 2015-11-21 ENCOUNTER — Inpatient Hospital Stay (HOSPITAL_COMMUNITY): Payer: Medicaid Other | Admitting: Certified Registered Nurse Anesthetist

## 2015-11-21 ENCOUNTER — Inpatient Hospital Stay (HOSPITAL_COMMUNITY)
Admission: AD | Admit: 2015-11-21 | Discharge: 2015-11-23 | DRG: 765 | Disposition: A | Discharge status: Home / Self Care | Payer: Medicaid Other | Source: Ambulatory Visit | Attending: Family Medicine | Admitting: Family Medicine

## 2015-11-21 DIAGNOSIS — O09299 Supervision of pregnancy with other poor reproductive or obstetric history, unspecified trimester: Secondary | ICD-10-CM

## 2015-11-21 DIAGNOSIS — O09219 Supervision of pregnancy with history of pre-term labor, unspecified trimester: Secondary | ICD-10-CM

## 2015-11-21 DIAGNOSIS — Z8249 Family history of ischemic heart disease and other diseases of the circulatory system: Secondary | ICD-10-CM

## 2015-11-21 DIAGNOSIS — O10913 Unspecified pre-existing hypertension complicating pregnancy, third trimester: Secondary | ICD-10-CM

## 2015-11-21 DIAGNOSIS — Z833 Family history of diabetes mellitus: Secondary | ICD-10-CM

## 2015-11-21 DIAGNOSIS — O34219 Maternal care for unspecified type scar from previous cesarean delivery: Secondary | ICD-10-CM | POA: Diagnosis present

## 2015-11-21 DIAGNOSIS — Z3A31 31 weeks gestation of pregnancy: Secondary | ICD-10-CM

## 2015-11-21 DIAGNOSIS — K660 Peritoneal adhesions (postprocedural) (postinfection): Secondary | ICD-10-CM | POA: Diagnosis present

## 2015-11-21 DIAGNOSIS — Z9889 Other specified postprocedural states: Secondary | ICD-10-CM

## 2015-11-21 DIAGNOSIS — O34211 Maternal care for low transverse scar from previous cesarean delivery: Secondary | ICD-10-CM | POA: Diagnosis present

## 2015-11-21 DIAGNOSIS — O2402 Pre-existing diabetes mellitus, type 1, in childbirth: Principal | ICD-10-CM | POA: Diagnosis present

## 2015-11-21 DIAGNOSIS — O1092 Unspecified pre-existing hypertension complicating childbirth: Secondary | ICD-10-CM | POA: Diagnosis present

## 2015-11-21 DIAGNOSIS — Z302 Encounter for sterilization: Secondary | ICD-10-CM

## 2015-11-21 DIAGNOSIS — Z823 Family history of stroke: Secondary | ICD-10-CM

## 2015-11-21 DIAGNOSIS — O99334 Smoking (tobacco) complicating childbirth: Secondary | ICD-10-CM | POA: Diagnosis present

## 2015-11-21 DIAGNOSIS — Z794 Long term (current) use of insulin: Secondary | ICD-10-CM

## 2015-11-21 DIAGNOSIS — E109 Type 1 diabetes mellitus without complications: Secondary | ICD-10-CM

## 2015-11-21 DIAGNOSIS — F17210 Nicotine dependence, cigarettes, uncomplicated: Secondary | ICD-10-CM | POA: Diagnosis present

## 2015-11-21 DIAGNOSIS — O09899 Supervision of other high risk pregnancies, unspecified trimester: Secondary | ICD-10-CM

## 2015-11-21 DIAGNOSIS — Z79899 Other long term (current) drug therapy: Secondary | ICD-10-CM

## 2015-11-21 DIAGNOSIS — Z841 Family history of disorders of kidney and ureter: Secondary | ICD-10-CM | POA: Diagnosis not present

## 2015-11-21 DIAGNOSIS — Z9119 Patient's noncompliance with other medical treatment and regimen: Secondary | ICD-10-CM | POA: Diagnosis not present

## 2015-11-21 DIAGNOSIS — O24019 Pre-existing diabetes mellitus, type 1, in pregnancy, unspecified trimester: Secondary | ICD-10-CM

## 2015-11-21 DIAGNOSIS — Z885 Allergy status to narcotic agent status: Secondary | ICD-10-CM

## 2015-11-21 DIAGNOSIS — O4703 False labor before 37 completed weeks of gestation, third trimester: Secondary | ICD-10-CM | POA: Diagnosis not present

## 2015-11-21 LAB — GLUCOSE, CAPILLARY
GLUCOSE-CAPILLARY: 119 mg/dL — AB (ref 65–99)
GLUCOSE-CAPILLARY: 128 mg/dL — AB (ref 65–99)
GLUCOSE-CAPILLARY: 158 mg/dL — AB (ref 65–99)
GLUCOSE-CAPILLARY: 104 mg/dL — AB (ref 65–99)
GLUCOSE-CAPILLARY: 75 mg/dL (ref 65–99)
Glucose-Capillary: 73 mg/dL (ref 65–99)
Glucose-Capillary: 73 mg/dL (ref 65–99)
Glucose-Capillary: 106 mg/dL — ABNORMAL HIGH (ref 65–99)
Glucose-Capillary: 107 mg/dL — ABNORMAL HIGH (ref 65–99)
Glucose-Capillary: 163 mg/dL — ABNORMAL HIGH (ref 65–99)
Glucose-Capillary: 89 mg/dL (ref 65–99)
Glucose-Capillary: 100 mg/dL — ABNORMAL HIGH (ref 65–99)

## 2015-11-21 LAB — TYPE AND SCREEN
ABO/RH(D): O POS
ANTIBODY SCREEN: NEGATIVE

## 2015-11-21 LAB — COMPREHENSIVE METABOLIC PANEL
ALK PHOS: 82 U/L (ref 38–126)
ALT: 11 U/L — ABNORMAL LOW (ref 14–54)
ANION GAP: 13 (ref 5–15)
AST: 20 U/L (ref 15–41)
Albumin: 2.6 g/dL — ABNORMAL LOW (ref 3.5–5.0)
BILIRUBIN TOTAL: 0.4 mg/dL (ref 0.3–1.2)
BUN: 11 mg/dL (ref 6–20)
CALCIUM: 8.4 mg/dL — AB (ref 8.9–10.3)
CO2: 21 mmol/L — ABNORMAL LOW (ref 22–32)
Chloride: 99 mmol/L — ABNORMAL LOW (ref 101–111)
Creatinine, Ser: 0.71 mg/dL (ref 0.44–1.00)
Glucose, Bld: 130 mg/dL — ABNORMAL HIGH (ref 65–99)
Potassium: 3.6 mmol/L (ref 3.5–5.1)
SODIUM: 133 mmol/L — AB (ref 135–145)
TOTAL PROTEIN: 6.5 g/dL (ref 6.5–8.1)

## 2015-11-21 LAB — PROTEIN / CREATININE RATIO, URINE
Creatinine, Urine: 54 mg/dL
PROTEIN CREATININE RATIO: 4.13 mg/mg{creat} — AB (ref 0.00–0.15)
TOTAL PROTEIN, URINE: 223 mg/dL

## 2015-11-21 LAB — CBC
HCT: 34.9 % — ABNORMAL LOW (ref 36.0–46.0)
HEMOGLOBIN: 12.1 g/dL (ref 12.0–15.0)
MCH: 31.3 pg (ref 26.0–34.0)
MCHC: 34.7 g/dL (ref 30.0–36.0)
MCV: 90.2 fL (ref 78.0–100.0)
PLATELETS: 334 10*3/uL (ref 150–400)
RBC: 3.87 MIL/uL (ref 3.87–5.11)
RDW: 12.8 % (ref 11.5–15.5)
WBC: 15.7 10*3/uL — ABNORMAL HIGH (ref 4.0–10.5)

## 2015-11-21 SURGERY — Surgical Case
Anesthesia: Spinal

## 2015-11-21 MED ORDER — PHENYLEPHRINE HCL 10 MG/ML IJ SOLN
INTRAMUSCULAR | Status: DC | PRN
  Administered 2015-11-21 (×2): 40 ug via INTRAVENOUS

## 2015-11-21 MED ORDER — MAGNESIUM SULFATE BOLUS VIA INFUSION
6.0000 g | Freq: Once | INTRAVENOUS | Status: AC
  Administered 2015-11-21: 6 g via INTRAVENOUS
  Filled 2015-11-21: qty 500

## 2015-11-21 MED ORDER — SCOPOLAMINE 1 MG/3DAYS TD PT72: 1.0000 | MEDICATED_PATCH | Freq: Once | TRANSDERMAL | Status: DC

## 2015-11-21 MED ORDER — OXYTOCIN 40 UNITS IN LACTATED RINGERS INFUSION - SIMPLE MED: 2.5000 [IU]/h | INTRAVENOUS | Status: DC

## 2015-11-21 MED ORDER — SODIUM CHLORIDE 0.9 % IV SOLN: INTRAVENOUS | Status: DC

## 2015-11-21 MED ORDER — FENTANYL CITRATE (PF) 100 MCG/2ML IJ SOLN
INTRAMUSCULAR | Status: AC
  Filled 2015-11-21: qty 2

## 2015-11-21 MED ORDER — OXYTOCIN 10 UNIT/ML IJ SOLN
40.0000 [IU] | INTRAMUSCULAR | Status: DC | PRN
  Administered 2015-11-21: 40 [IU] via INTRAVENOUS

## 2015-11-21 MED ORDER — SODIUM CHLORIDE 0.9 % IV SOLN
INTRAVENOUS | Status: DC
  Administered 2015-11-21: 1 [IU]/h via INTRAVENOUS
  Administered 2015-11-21: 13:00:00 via INTRAVENOUS
  Filled 2015-11-21: qty 2.5

## 2015-11-21 MED ORDER — ZOLPIDEM TARTRATE 5 MG PO TABS: 5.0000 mg | ORAL_TABLET | Freq: Every evening | ORAL | Status: DC | PRN

## 2015-11-21 MED ORDER — INSULIN ASPART 100 UNIT/ML ~~LOC~~ SOLN: 5.0000 [IU] | Freq: Three times a day (TID) | SUBCUTANEOUS | Status: DC

## 2015-11-21 MED ORDER — LACTATED RINGERS IV SOLN: 500.0000 mL | INTRAVENOUS | Status: DC | PRN

## 2015-11-21 MED ORDER — DIBUCAINE 1 % RE OINT: 1.0000 "application " | TOPICAL_OINTMENT | RECTAL | Status: DC | PRN

## 2015-11-21 MED ORDER — PRENATAL MULTIVITAMIN CH
1.0000 | ORAL_TABLET | Freq: Every day | ORAL | Status: DC
  Administered 2015-11-22: 1 via ORAL
  Filled 2015-11-21: qty 1

## 2015-11-21 MED ORDER — TETANUS-DIPHTH-ACELL PERTUSSIS 5-2.5-18.5 LF-MCG/0.5 IM SUSP: 0.5000 mL | Freq: Once | INTRAMUSCULAR | Status: DC

## 2015-11-21 MED ORDER — DIPHENHYDRAMINE HCL 50 MG/ML IJ SOLN: 12.5000 mg | INTRAMUSCULAR | Status: DC | PRN

## 2015-11-21 MED ORDER — LACTATED RINGERS IV SOLN
INTRAVENOUS | Status: DC | PRN
  Administered 2015-11-21 (×2): via INTRAVENOUS

## 2015-11-21 MED ORDER — OXYTOCIN 40 UNITS IN LACTATED RINGERS INFUSION - SIMPLE MED: 2.5000 [IU]/h | INTRAVENOUS | Status: AC

## 2015-11-21 MED ORDER — ONDANSETRON HCL 4 MG/2ML IJ SOLN
INTRAMUSCULAR | Status: DC | PRN
  Administered 2015-11-21: 4 mg via INTRAVENOUS

## 2015-11-21 MED ORDER — SIMETHICONE 80 MG PO CHEW
80.0000 mg | CHEWABLE_TABLET | Freq: Three times a day (TID) | ORAL | Status: DC
  Administered 2015-11-21 – 2015-11-23 (×6): 80 mg via ORAL
  Filled 2015-11-21 (×7): qty 1

## 2015-11-21 MED ORDER — PHENYLEPHRINE 8 MG IN D5W 100 ML (0.08MG/ML) PREMIX OPTIME
INJECTION | INTRAVENOUS | Status: AC
  Filled 2015-11-21: qty 100

## 2015-11-21 MED ORDER — MEASLES, MUMPS & RUBELLA VAC ~~LOC~~ INJ
0.5000 mL | INJECTION | Freq: Once | SUBCUTANEOUS | Status: DC
  Filled 2015-11-21: qty 0.5

## 2015-11-21 MED ORDER — LIDOCAINE HCL (PF) 1 % IJ SOLN: 30.0000 mL | INTRAMUSCULAR | Status: DC | PRN

## 2015-11-21 MED ORDER — INSULIN GLARGINE 100 UNIT/ML ~~LOC~~ SOLN
10.0000 [IU] | Freq: Every day | SUBCUTANEOUS | Status: DC
  Administered 2015-11-21 – 2015-11-22 (×2): 10 [IU] via SUBCUTANEOUS
  Filled 2015-11-21 (×3): qty 0.1

## 2015-11-21 MED ORDER — NALBUPHINE HCL 10 MG/ML IJ SOLN: 5.0000 mg | Freq: Once | INTRAMUSCULAR | Status: DC | PRN

## 2015-11-21 MED ORDER — FENTANYL CITRATE (PF) 100 MCG/2ML IJ SOLN
100.0000 ug | Freq: Once | INTRAMUSCULAR | Status: AC
  Administered 2015-11-21: 100 ug via INTRAVENOUS

## 2015-11-21 MED ORDER — FENTANYL CITRATE (PF) 100 MCG/2ML IJ SOLN
25.0000 ug | INTRAMUSCULAR | Status: DC | PRN
  Administered 2015-11-21: 50 ug via INTRAVENOUS

## 2015-11-21 MED ORDER — MORPHINE SULFATE (PF) 0.5 MG/ML IJ SOLN
INTRAMUSCULAR | Status: DC | PRN
  Administered 2015-11-21: .1 mg via EPIDURAL

## 2015-11-21 MED ORDER — MENTHOL 3 MG MT LOZG: 1.0000 | LOZENGE | OROMUCOSAL | Status: DC | PRN

## 2015-11-21 MED ORDER — LACTATED RINGERS IV SOLN
INTRAVENOUS | Status: DC | PRN
  Administered 2015-11-21: 15:00:00 via INTRAVENOUS

## 2015-11-21 MED ORDER — OXYTOCIN 10 UNIT/ML IJ SOLN
INTRAMUSCULAR | Status: AC
  Filled 2015-11-21: qty 4

## 2015-11-21 MED ORDER — COCONUT OIL OIL: 1.0000 "application " | TOPICAL_OIL | Status: DC | PRN

## 2015-11-21 MED ORDER — BUPIVACAINE HCL (PF) 0.25 % IJ SOLN
INTRAMUSCULAR | Status: DC | PRN
  Administered 2015-11-21: 30 mL

## 2015-11-21 MED ORDER — MEPERIDINE HCL 25 MG/ML IJ SOLN: 6.2500 mg | INTRAMUSCULAR | Status: DC | PRN

## 2015-11-21 MED ORDER — NALOXONE HCL 2 MG/2ML IJ SOSY
1.0000 ug/kg/h | PREFILLED_SYRINGE | INTRAVENOUS | Status: DC | PRN
  Filled 2015-11-21: qty 2

## 2015-11-21 MED ORDER — DEXTROSE IN LACTATED RINGERS 5 % IV SOLN: INTRAVENOUS | Status: DC

## 2015-11-21 MED ORDER — ACETAMINOPHEN 325 MG PO TABS: 650.0000 mg | ORAL_TABLET | ORAL | Status: DC | PRN

## 2015-11-21 MED ORDER — SODIUM CHLORIDE 0.9 % IV SOLN
2.0000 g | Freq: Once | INTRAVENOUS | Status: DC
  Filled 2015-11-21: qty 2000

## 2015-11-21 MED ORDER — LACTATED RINGERS IV SOLN: INTRAVENOUS | Status: DC

## 2015-11-21 MED ORDER — WITCH HAZEL-GLYCERIN EX PADS: 1.0000 "application " | MEDICATED_PAD | CUTANEOUS | Status: DC | PRN

## 2015-11-21 MED ORDER — PHENYLEPHRINE 40 MCG/ML (10ML) SYRINGE FOR IV PUSH (FOR BLOOD PRESSURE SUPPORT)
PREFILLED_SYRINGE | INTRAVENOUS | Status: AC
  Filled 2015-11-21: qty 10

## 2015-11-21 MED ORDER — PENICILLIN G POTASSIUM 5000000 UNITS IJ SOLR
2.5000 10*6.[IU] | INTRAVENOUS | Status: DC
  Filled 2015-11-21 (×3): qty 2.5

## 2015-11-21 MED ORDER — FENTANYL CITRATE (PF) 100 MCG/2ML IJ SOLN
INTRAMUSCULAR | Status: DC | PRN
  Administered 2015-11-21: 50 ug via INTRAVENOUS

## 2015-11-21 MED ORDER — ONDANSETRON HCL 4 MG/2ML IJ SOLN
INTRAMUSCULAR | Status: AC
  Filled 2015-11-21: qty 2

## 2015-11-21 MED ORDER — NALOXONE HCL 0.4 MG/ML IJ SOLN: 0.4000 mg | INTRAMUSCULAR | Status: DC | PRN

## 2015-11-21 MED ORDER — SENNOSIDES-DOCUSATE SODIUM 8.6-50 MG PO TABS
2.0000 | ORAL_TABLET | ORAL | Status: DC
  Administered 2015-11-21 – 2015-11-22 (×2): 2 via ORAL
  Filled 2015-11-21 (×2): qty 2

## 2015-11-21 MED ORDER — PHENYLEPHRINE 8 MG IN D5W 100 ML (0.08MG/ML) PREMIX OPTIME
INJECTION | INTRAVENOUS | Status: DC | PRN
  Administered 2015-11-21: 50 ug/min via INTRAVENOUS

## 2015-11-21 MED ORDER — NALBUPHINE HCL 10 MG/ML IJ SOLN
5.0000 mg | INTRAMUSCULAR | Status: DC | PRN
  Administered 2015-11-21 – 2015-11-22 (×2): 5 mg via INTRAVENOUS
  Filled 2015-11-21 (×2): qty 1

## 2015-11-21 MED ORDER — DEXTROSE IN LACTATED RINGERS 5 % IV SOLN
INTRAVENOUS | Status: DC
  Administered 2015-11-21: 11:00:00 via INTRAVENOUS

## 2015-11-21 MED ORDER — BUPIVACAINE IN DEXTROSE 0.75-8.25 % IT SOLN
INTRATHECAL | Status: DC | PRN
  Administered 2015-11-21: 1.4 mL via INTRATHECAL

## 2015-11-21 MED ORDER — SODIUM CHLORIDE 0.9% FLUSH: 3.0000 mL | INTRAVENOUS | Status: DC | PRN

## 2015-11-21 MED ORDER — ONDANSETRON HCL 4 MG/2ML IJ SOLN: 4.0000 mg | Freq: Four times a day (QID) | INTRAMUSCULAR | Status: DC | PRN

## 2015-11-21 MED ORDER — DEXTROSE-NACL 5-0.45 % IV SOLN
INTRAVENOUS | Status: DC
  Administered 2015-11-21: 12:00:00 via INTRAVENOUS

## 2015-11-21 MED ORDER — SIMETHICONE 80 MG PO CHEW: 80.0000 mg | CHEWABLE_TABLET | ORAL | Status: DC | PRN

## 2015-11-21 MED ORDER — ONDANSETRON HCL 4 MG/2ML IJ SOLN: 4.0000 mg | Freq: Three times a day (TID) | INTRAMUSCULAR | Status: DC | PRN

## 2015-11-21 MED ORDER — INSULIN GLARGINE 100 UNIT/ML ~~LOC~~ SOLN
10.0000 [IU] | Freq: Every day | SUBCUTANEOUS | Status: DC
  Filled 2015-11-21: qty 0.1

## 2015-11-21 MED ORDER — NALBUPHINE HCL 10 MG/ML IJ SOLN: 5.0000 mg | INTRAMUSCULAR | Status: DC | PRN

## 2015-11-21 MED ORDER — CEFAZOLIN SODIUM-DEXTROSE 2-3 GM-% IV SOLR
INTRAVENOUS | Status: DC | PRN
  Administered 2015-11-21: 2 g via INTRAVENOUS

## 2015-11-21 MED ORDER — SIMETHICONE 80 MG PO CHEW
80.0000 mg | CHEWABLE_TABLET | ORAL | Status: DC
  Administered 2015-11-21 – 2015-11-22 (×2): 80 mg via ORAL
  Filled 2015-11-21 (×2): qty 1

## 2015-11-21 MED ORDER — MORPHINE SULFATE (PF) 0.5 MG/ML IJ SOLN
INTRAMUSCULAR | Status: AC
  Filled 2015-11-21: qty 10

## 2015-11-21 MED ORDER — PENICILLIN G POTASSIUM 5000000 UNITS IJ SOLR
5.0000 10*6.[IU] | Freq: Once | INTRAVENOUS | Status: AC
  Administered 2015-11-21: 5 10*6.[IU] via INTRAVENOUS
  Filled 2015-11-21: qty 5

## 2015-11-21 MED ORDER — DIPHENHYDRAMINE HCL 25 MG PO CAPS
25.0000 mg | ORAL_CAPSULE | ORAL | Status: DC | PRN
  Filled 2015-11-21: qty 1

## 2015-11-21 MED ORDER — ACETAMINOPHEN 500 MG PO TABS: 1000.0000 mg | ORAL_TABLET | Freq: Four times a day (QID) | ORAL | Status: DC

## 2015-11-21 MED ORDER — SOD CITRATE-CITRIC ACID 500-334 MG/5ML PO SOLN
30.0000 mL | ORAL | Status: DC | PRN
  Administered 2015-11-21: 30 mL via ORAL
  Filled 2015-11-21: qty 15

## 2015-11-21 MED ORDER — BUPIVACAINE HCL (PF) 0.25 % IJ SOLN
INTRAMUSCULAR | Status: AC
  Filled 2015-11-21: qty 30

## 2015-11-21 MED ORDER — IBUPROFEN 600 MG PO TABS
600.0000 mg | ORAL_TABLET | Freq: Four times a day (QID) | ORAL | Status: DC
  Administered 2015-11-21 – 2015-11-23 (×7): 600 mg via ORAL
  Filled 2015-11-21 (×7): qty 1

## 2015-11-21 MED ORDER — OXYTOCIN BOLUS FROM INFUSION: 500.0000 mL | INTRAVENOUS | Status: DC

## 2015-11-21 MED ORDER — DIPHENHYDRAMINE HCL 25 MG PO CAPS
25.0000 mg | ORAL_CAPSULE | Freq: Four times a day (QID) | ORAL | Status: DC | PRN
  Administered 2015-11-22 – 2015-11-23 (×3): 25 mg via ORAL
  Filled 2015-11-21 (×4): qty 1

## 2015-11-21 MED ORDER — MAGNESIUM SULFATE 50 % IJ SOLN
2.0000 g/h | INTRAVENOUS | Status: DC
  Filled 2015-11-21: qty 80

## 2015-11-21 SURGICAL SUPPLY — 31 items
APL SKNCLS STERI-STRIP NONHPOA (GAUZE/BANDAGES/DRESSINGS) ×1
BENZOIN TINCTURE PRP APPL 2/3 (GAUZE/BANDAGES/DRESSINGS) ×2 IMPLANT
CLAMP CORD UMBIL (MISCELLANEOUS) IMPLANT
CLIP FILSHIE TUBAL LIGA STRL (Clip) ×2 IMPLANT
CLOTH BEACON ORANGE TIMEOUT ST (SAFETY) ×3 IMPLANT
DRSG OPSITE POSTOP 4X10 (GAUZE/BANDAGES/DRESSINGS) ×3 IMPLANT
DURAPREP 26ML APPLICATOR (WOUND CARE) ×3 IMPLANT
ELECT REM PT RETURN 9FT ADLT (ELECTROSURGICAL) ×3
ELECTRODE REM PT RTRN 9FT ADLT (ELECTROSURGICAL) ×1 IMPLANT
EXTRACTOR VACUUM M CUP 4 TUBE (SUCTIONS) IMPLANT
EXTRACTOR VACUUM M CUP 4' TUBE (SUCTIONS)
GLOVE BIOGEL PI IND STRL 7.0 (GLOVE) ×2 IMPLANT
GLOVE BIOGEL PI INDICATOR 7.0 (GLOVE) ×4
GLOVE ECLIPSE 7.0 STRL STRAW (GLOVE) ×6 IMPLANT
GOWN STRL REUS W/TWL LRG LVL3 (GOWN DISPOSABLE) ×6 IMPLANT
KIT ABG SYR 3ML LUER SLIP (SYRINGE) IMPLANT
NDL HYPO 25X5/8 SAFETYGLIDE (NEEDLE) IMPLANT
NEEDLE HYPO 22GX1.5 SAFETY (NEEDLE) ×3 IMPLANT
NEEDLE HYPO 25X5/8 SAFETYGLIDE (NEEDLE) IMPLANT
NS IRRIG 1000ML POUR BTL (IV SOLUTION) ×3 IMPLANT
PACK C SECTION WH (CUSTOM PROCEDURE TRAY) ×3 IMPLANT
PAD ABD 7.5X8 STRL (GAUZE/BANDAGES/DRESSINGS) ×2 IMPLANT
PAD OB MATERNITY 4.3X12.25 (PERSONAL CARE ITEMS) ×3 IMPLANT
PENCIL SMOKE EVAC W/HOLSTER (ELECTROSURGICAL) ×3 IMPLANT
RTRCTR C-SECT PINK 25CM LRG (MISCELLANEOUS) ×3 IMPLANT
SUT VIC AB 0 CTX 36 (SUTURE) ×12
SUT VIC AB 0 CTX36XBRD ANBCTRL (SUTURE) ×3 IMPLANT
SUT VIC AB 4-0 KS 27 (SUTURE) ×3 IMPLANT
SYR 30ML LL (SYRINGE) ×3 IMPLANT
TOWEL OR 17X24 6PK STRL BLUE (TOWEL DISPOSABLE) ×3 IMPLANT
TRAY FOLEY CATH SILVER 14FR (SET/KITS/TRAYS/PACK) ×3 IMPLANT

## 2015-11-21 NOTE — Anesthesia Procedure Notes (Addendum)
Spinal Patient location during procedure: OR Preanesthetic Checklist Completed: patient identified, site marked, surgical consent, pre-op evaluation, timeout performed, IV checked, risks and benefits discussed and monitors and equipment checked Spinal Block Patient position: sitting Prep: DuraPrep Patient monitoring: heart rate, cardiac monitor, continuous pulse ox and blood pressure Approach: midline Location: L3-4 Injection technique: single-shot Needle Needle type: Sprotte  Needle gauge: 24 G Needle length: 9 cm Assessment Sensory level: T4 Additional Notes Spinal Dosage in OR  Bupivicaine ml       1.4 [id number]   mcg        100

## 2015-11-21 NOTE — H&P (Signed)
LABOR ADMISSION HISTORY AND PHYSICAL  Ann Robertson is a 25 y.o. female [phone] with IUP at [redacted]w[redacted]d by 10w [place], pregnancy complicated by 0000000, cHTN, hx of C-section x3,  presenting for strong frequent contractions since last night. She was brought in by EMS this morning. She denies vaginal bleeding, gush of fluid. Fetal movement present and at baseline. Denies fluid leak or gush, vaginal bleeding,  headaches, blurry vision, RUQ pain or peripheral edema.   Off note, patient was seen here for contraction on 6/18. At that time showed 3 cm dilated with 60% effacement. She was given Procardia, terbutaline, magnesium sulfate and first dose of betamethasone. Repeat cervical exam in 2 hours by the same examiner was unchanged. Since NICU was full at that time she was transferred to [facility], where she was admitted. Per the admission note from [facility], cervical exam changed from 3/60/-3 to 4/75/-3. She was started on Magnesium, Procardia, and Ampicillin. Overnight patient stopped contracting. SVE remained stable. On HD2, she received her 2nd BMZ shot.  Plan notes from outside hospital, patient left AMA although her blood glucose was elevated in high 100's.   Patient was seen in MAU 2 nights ago for contraction. At that time she was 3 cm dilated. She was given Procardia 30 mg XL to be taken twice a day. She reports feeling that prescription the same night and taking the medication as prescribed. He last dose is this morning.   Dating: By 59 w [place] --->  Estimated Date of Delivery: 01/21/16  Sono:    @[redacted]w[redacted]d , CWD, normal anatomy,  950 g, 36% EFW   Prenatal History/Complications: Clinic  [facility] Prenatal Labs  Dating  10 week ultrasound Blood type: --/--/O POS (06/18 0645)   Genetic Screen Quad:     NIPS: Antibody:NEG (06/18 0645)  Anatomic [place]  normal Rubella: Immune (01/06 0000) immune  GTT  type 1  RPR: Non Reactive (06/18 0645) NR  Flu vaccine  declined HBsAg: Negative (01/06 0000) Neg  TDaP vaccine  11/06/2015                                              Rhogam: HIV: NONREACTIVE (06/08 1125) NR  Baby Food  bottle                                             GBS: (For PCN allergy, check sensitivities)  Contraception  undecided, maybe BTL Pap: Normal (05/06/2014)  Circumcision    Pediatrician    Support Person     Past Medical History: Past Medical History  Diagnosis Date  . Diabetes mellitus   . Juvenile diabetes mellitus   . Juvenile diabetes 2000  . Neuropathy ([place])   . High cholesterol   . HSV-2 infection   . STD (sexually transmitted disease)   . Preeclampsia     Past Surgical History: Past Surgical History  Procedure Laterality Date  . Cesarean section    . Wisdom tooth extraction    . Cesarean section N/A 07/05/2013    Procedure: CESAREAN SECTION;  Surgeon: Emily Filbert, MD;  Location: [place] [facility];  Service: Obstetrics;  Laterality: N/A;  . Cesarean section N/A 07/25/2014    Procedure: CESAREAN SECTION;  Surgeon: Donnamae Jude, MD;  Location: [facility]  [facility];  Service: Obstetrics;  Laterality: N/A;    Obstetrical History: OB History    Gravida Para Term Preterm AB TAB SAB Ectopic Multiple Living   5 3 0 3 1 0 1 0 0 3       Social History: Social History   Social History  . Marital Status: Single    Spouse Name: N/A  . Number of Children: N/A  . Years of Education: N/A   Social History Main Topics  . Smoking status: Current Every Day Smoker -- 0.25 packs/day    Types: Cigarettes  . Smokeless tobacco: Never Used  . Alcohol Use: No  . Drug Use: No  . Sexual Activity: Not Asked   Other Topics Concern  . None   Social History Narrative    Family History: Family History  Problem Relation Age of Onset  . Stroke Mother   . Hypertension Mother   . Heart disease Mother   . Kidney disease Mother   . Hyperlipidemia Father   . Diabetes Father     Allergies: Allergies  Allergen Reactions  . Hydromorphone Hcl Itching and Rash    Prescriptions prior to admission  Medication Sig  Dispense Refill Last Dose  . insulin aspart (NOVOLOG) 100 UNIT/ML injection 56 units daily per v-go pump. 20 mL 2   . metroNIDAZOLE (FLAGYL) 500 MG tablet Take 1 tablet (500 mg total) by mouth 2 (two) times daily. (Patient not taking: Reported on 11/14/2015) 14 tablet 0   . NIFEdipine (PROCARDIA XL) 30 MG 24 hr tablet Take 1 tablet (30 mg total) by mouth 2 (two) times daily. 60 tablet 2      Review of Systems  Blood pressure 156/103, pulse 109, resp. rate 20, last menstrual period 03/15/2015, not currently breastfeeding. GEN: appearance: alert, appears stated age and moderate distress from contraction RESP: clear to auscultation bilaterally, no increased WOB CVS:: regular rate and rhythm, no murmurs, no sign of DVT, +2 DP GI: soft, non-tender; bowel sounds normal MSK: WWP, Homans sign is negative,   Pelvic Exam: Cervical exam: Dilation: 6 Effacement (%): 100 Station: -1 Exam by:: Dr Kennon Rounds Presentation: cephalic Uterine activityDate/time of onset: strong and frequent contractions since last night. Has some contractions for days  Fetal monitoringBaseline: 130 bpm, Variability: Good {> 6 bpm), Accelerations: Reactive and Decelerations: Absent  Prenatal labs: ABO, Rh: --/--/O POS (06/18 0645) Antibody: NEG (06/18 0645) Rubella: immune RPR: Non Reactive (06/18 0645)  HBsAg: Negative (01/06 0000)  HIV: NONREACTIVE (06/08 1125)  GBS:   unknown 1 hr Glucola Type 1 Diabete Genetic screening  Not done Anatomy US normal  Prenatal Transfer Tool  Maternal Diabetes: Yes:  Diabetes Type:  Pre-pregnancy Genetic Screening: not done Maternal Ultrasounds/Referrals: Normal Fetal Ultrasounds or other Referrals:  None Maternal Substance Abuse:  Yes:  Type: Smoker Significant Maternal Medications:  Meds include: Other: procardia and insulin Significant Maternal Lab Results: Lab values include: Other: intermittently high blood glucose  Results for orders placed or performed during the hospital  encounter of 11/21/15 (from the past 24 hour(s))  CBC   Collection Time: 11/21/15 10:30 AM  Result Value Ref Range   WBC 15.7 (H) 4.0 - 10.5 K/uL   RBC 3.87 3.87 - 5.11 MIL/uL   Hemoglobin 12.1 12.0 - 15.0 g/dL   HCT 34.9 (L) 36.0 - 46.0 %   MCV 90.2 78.0 - 100.0 fL   MCH 31.3 26.0 - 34.0 pg   MCHC 34.7 30.0 - 36.0 g/dL   RDW 12.8 11.5 -  15.5 %   Platelets 334 150 - 400 K/uL    Patient Active Problem List   Diagnosis Date Noted  . Preterm labor 11/16/2015  . Noncompliance with diabetes treatment 11/15/2015  . Traumatic injury during pregnancy in third trimester 11/14/2015  . Maternal chronic hypertension 11/06/2015  . Type 1 diabetes mellitus complicating pregnancy, antepartum 07/14/2015  . Previous cesarean section complicating pregnancy [id number]  . Short interval between pregnancies complicating pregnancy, antepartum 07/14/2015  . History of preterm delivery, currently pregnant 07/14/2015  . History of pre-eclampsia in prior pregnancy, currently pregnant 07/14/2015  . Supervision of high risk pregnancy, antepartum 07/14/2015  . History of preterm delivery 09/06/2014  . History of polyhydramnios 09/06/2014  . 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 [id number] at 110w2d here for preterm labor actively contracting.   #Preterm Labor: actively contracting. Wants to have repeat c/s. Mag for neuro protection. S/p BMZ x2. #Pain: fentanyl as needed #FWB: CAT-1 #ID: GBS unknown. PCN #MOF: bottle #MOC: BTL (papers signed) #Type 1 DM: glucose stablizer (insulin drip). CBG every 4 hours  Taye T Gonfa 11/21/2015, 11:21 AM

## 2015-11-21 NOTE — MAU Note (Signed)
Pt presents to MAU via EMS with complaints of contractions that started yesterday. Reports previous  C/S x 3.

## 2015-11-21 NOTE — Progress Notes (Signed)
Ann Robertson was tearful as she was brought to labor and delivery.  She was alone and her mother and SO had not yet arrived.  She was worried about her baby, [name], and was overwhelmed by how quickly things were happening.    I stayed with her to offer emotional support until her mother arrived about 45 minutes later.  Her mother reported that her other 3 children were with pt's sister and that they were fine.  [place], [place] Pager, [phone] 1:31 PM

## 2015-11-21 NOTE — Transfer of Care (Signed)
Immediate Anesthesia Transfer of Care Note  Patient: Ann Robertson  Procedure(s) Performed: Procedure(s): CESAREAN SECTION (N/A)  Patient Location: PACU  Anesthesia Type:Spinal  Level of Consciousness: awake, alert  and oriented  Airway & Oxygen Therapy: Patient Spontanous Breathing  Post-op Assessment: Report given to RN and Post -op Vital signs reviewed and stable  Post vital signs: Reviewed and stable  Last Vitals:  Filed Vitals:   11/21/15 1430 11/21/15 1608  BP: 146/96 132/104  Pulse: 114   Temp:    Resp:      Last Pain:  Filed Vitals:   11/21/15 1610  PainSc: 8          Complications: No apparent anesthesia complications

## 2015-11-21 NOTE — Progress Notes (Signed)
When pt arrived to dept at 1743, pt was in good spirits but anxious overall, Scratching at tape, IV lines, etc. I worked with CNM to get order for IV insulin restarted and when I went back into her room I was asking her if she wanted something to drink, and she stated that she just wanted to go see her baby because she was told in recovery that she could go "as soon as she got up here." I explained that I needed to assess her and could give her some benadryl for her itching then I would help her up since she can move and feel her legs enough to get into wheelchair. Pt agreed and then was helped to wheelchair by NT. NT then helped pt back to bed after using ammonia because pt stated that she thought she was going to pass out. I did not see her until she was back in bed and alert. It is unclear if pt actually passed out. NT states that as soon as they opened ammonia cap pt was very alert. Pt back in bed and more comfortable bc I changed the tape that was bothering her and she is ok with not seeing her baby yet. Report was given to night shift RN. Pt stable, alert and oriented, sitting up talking to SO and her children at this time. Marry Guan

## 2015-11-21 NOTE — Progress Notes (Signed)
Per patient's nurse--she has progressed her cervix. We will move her to C-section. OR notified.

## 2015-11-21 NOTE — Anesthesia Preprocedure Evaluation (Addendum)
Anesthesia Evaluation  Patient identified by MRN, date of birth, ID band Patient awake    Reviewed: Allergy & Precautions, H&P , Patient's Chart, lab work & pertinent test results  Airway Mallampati: II  TM Distance: >3 FB Neck ROM: full    Dental  (+) Teeth Intact   Pulmonary Current Smoker,    breath sounds clear to auscultation       Cardiovascular hypertension,  Rhythm:regular Rate:Normal     Neuro/Psych    GI/Hepatic   Endo/Other  diabetes, Type 1, Insulin Dependent  Renal/GU      Musculoskeletal   Abdominal   Peds  Hematology   Anesthesia Other Findings       Reproductive/Obstetrics (+) Pregnancy                            Anesthesia Physical Anesthesia Plan  ASA: II and emergent  Anesthesia Plan: Spinal   Post-op Pain Management:    Induction:   Airway Management Planned:   Additional Equipment:   Intra-op Plan:   Post-operative Plan:   Informed Consent: I have reviewed the patients History and Physical, chart, labs and discussed the procedure including the risks, benefits and alternatives for the proposed anesthesia with the patient or authorized representative who has indicated his/her understanding and acceptance.   Dental Advisory Given  Plan Discussed with: CRNA  Anesthesia Plan Comments: (Lab work confirmed with CRNA in room. Platelets okay. Discussed spinal anesthetic, and patient consents to the procedure:  included risk of possible headache,backache, failed block, allergic reaction, and nerve injury. This patient was asked if she had any questions or concerns before the procedure started. )        Anesthesia Quick Evaluation

## 2015-11-21 NOTE — Op Note (Signed)
Preoperative Diagnosis:  IUP @ [redacted]w[redacted]d, Prior C-section x 3, advanced preterm labor, Undesired fertility  Postoperative Diagnosis:  Same  Procedure: Repeat low transverse cesarean section, Bilateral Tubal Ligation with Filshie clips  Surgeon: Darron Doom, M.D.  Assistant: None  Anesthesia: spinal with Lyndle Herrlich Findings: Viable female infant, APGAR (1 MIN): 8   APGAR (5 MINS): 9   Normal tubes and ovaries  Estimated blood loss: 123XX123 cc  Complications: None known  Specimens: Placenta to labor and delivery  Reason for procedure: Briefly, the patient is a 25 y.o. [id number] at [redacted]w[redacted]d with h/o previous C-section who desires permanent sterility.  Patient counseled, r.e. Risks benefits of BTL, including permanency of procedure, risk of failure(1:100), increased risk of ectopic.  Patient verbalized understanding and desires to proceed   Procedure: Patient is a to the OR where spinal analgesia was administered. She was then placed in a supine position with left lateral tilt. She received 2 g of Ancef and SCDs were in place. A Foley catheter was placed in the bladder. She was prepped and draped in the usual sterile fashion. A timeout was performed. A knife was then used to make a Pfannenstiel incision. This incision was carried out to underlying fascia which was divided in the midline with the Bovie. The incision was extended laterally, sharply. The fascia was dissected of the underlying rectus superiorly and inferiorly.  The rectus was divided in the midline.  The peritoneal cavity was entered bluntly.  The right rectus was divided to allow room. Omental adhesions taken down. Bladder dissected off uterus. Alexis retractor was placed inside the incision.  A knife was used to make a low transverse incision on the uterus. This incision was carried down to the amniotic cavity was entered. Fetus was in LOA position and was brought up out of the incision with some difficulty.the arm had to be replaced in the  uterus. Delayed cord clamping occurred x 1 min. Cord was clamped x 2 and cut. Infant taken to waiting pediatrician.  Cord blood was obtained. Placenta was delivered from the uterus.  Uterus was cleaned with dry lap pads. Uterine incision closed with 0 Vicryl suture in a locked running fashion. A figure of eight was needed at the  Attention was turned to the pt's left tube which was grasped with a Babcock clamp and followed to its fimbriated end.  A Filshie clip was placed across the tube 1.5 cm from the cornu.  Attention was turned to the pt's right tube which was grasped with a Babcock clamp and followed to its fimbriated end.  A Filshie clip was placed across the tube 1.5 cm from the cornu. A 1cc of 0.25% Marcaine was injected into the surrounding tubes bilaterally.  Alexis retractor was removed from the abdomen. Peritoneal closure was done with 0 Vicryl suture.  There was bleeding from the rectus and electrocautery and suture was used to obtain hemostasis.  Fascia is closed with 0 Vicryl suture in a running fashion. Subcutaneous tissue infused with 30cc 0.25% Marcaine.  Skin closed using 3-0 Vicryl on a Keith needle.  Steri strips applied, followed by pressure dressing.  All instrument, needle and lap counts were correct x 2.  Patient was awake and taken to PACU stable.  Infant to NICU, stable.

## 2015-11-22 LAB — GLUCOSE, CAPILLARY
GLUCOSE-CAPILLARY: 231 mg/dL — AB (ref 65–99)
GLUCOSE-CAPILLARY: 113 mg/dL — AB (ref 65–99)
GLUCOSE-CAPILLARY: 248 mg/dL — AB (ref 65–99)
Glucose-Capillary: 139 mg/dL — ABNORMAL HIGH (ref 65–99)
Glucose-Capillary: 72 mg/dL (ref 65–99)

## 2015-11-22 LAB — CBC
HCT: 27.2 % — ABNORMAL LOW (ref 36.0–46.0)
HEMOGLOBIN: 9.3 g/dL — AB (ref 12.0–15.0)
MCH: 30.8 pg (ref 26.0–34.0)
MCHC: 34.2 g/dL (ref 30.0–36.0)
MCV: 90.1 fL (ref 78.0–100.0)
Platelets: 253 10*3/uL (ref 150–400)
RBC: 3.02 MIL/uL — ABNORMAL LOW (ref 3.87–5.11)
RDW: 13 % (ref 11.5–15.5)
WBC: 11.4 10*3/uL — ABNORMAL HIGH (ref 4.0–10.5)

## 2015-11-22 LAB — RPR: RPR Ser Ql: NONREACTIVE

## 2015-11-22 MED ORDER — OXYCODONE-ACETAMINOPHEN 5-325 MG PO TABS
1.0000 | ORAL_TABLET | ORAL | Status: DC | PRN
  Administered 2015-11-22 (×2): 1 via ORAL
  Administered 2015-11-22 – 2015-11-23 (×3): 2 via ORAL
  Administered 2015-11-23: 1 via ORAL
  Filled 2015-11-22 (×2): qty 2
  Filled 2015-11-22 (×2): qty 1
  Filled 2015-11-22 (×2): qty 2

## 2015-11-22 MED ORDER — INSULIN ASPART 100 UNIT/ML ~~LOC~~ SOLN
5.0000 [IU] | Freq: Three times a day (TID) | SUBCUTANEOUS | Status: DC
  Administered 2015-11-22: 5 [IU] via SUBCUTANEOUS

## 2015-11-22 MED ORDER — INSULIN ASPART 100 UNIT/ML ~~LOC~~ SOLN
9.0000 [IU] | Freq: Three times a day (TID) | SUBCUTANEOUS | Status: DC
  Administered 2015-11-22: 9 [IU] via SUBCUTANEOUS

## 2015-11-22 NOTE — Progress Notes (Signed)
Patient was accompanied to NICU by NT. She visited with her baby and returned to the unit safely. No concern voiced. We will continue to monitor.

## 2015-11-22 NOTE — Progress Notes (Signed)
Patient went off the Unit with other family members including her two babies to visit her new born in NICU.

## 2015-11-22 NOTE — Progress Notes (Signed)
Subjective: Postpartum Day 1: Cesarean Delivery Patient reports incisional pain and no problems voiding.    Objective: Vital signs in last 24 hours: Temp:  [96.9 F (36.1 C)-98.8 F (37.1 C)] 98.2 F (36.8 C) (06/24 0610) Pulse Rate:  [89-120] 91 (06/24 0610) Resp:  [13-23] 16 (06/24 0610) BP: (123-164)/(80-104) 130/88 mmHg (06/24 0610) SpO2:  [93 %-100 %] 99 % (06/24 0610)  Physical Exam:  General: alert, cooperative and appears stated age 25: appropriate Uterine Fundus: firm Incision: no significant drainage DVT Evaluation: No evidence of DVT seen on physical exam.   Recent Labs  11/21/15 1030 11/22/15 0631  HGB 12.1 9.3*  HCT 34.9* 27.2*   CBG (last 3)   Recent Labs  11/21/15 1947 11/21/15 2057 11/21/15 2204  GLUCAP 75 89 100*     Assessment/Plan: Status post Cesarean section. Doing well postoperatively.  Continue current care. Continue current insulin doses  Darya Bigler S 11/22/2015, 7:32 AM

## 2015-11-22 NOTE — Anesthesia Postprocedure Evaluation (Signed)
Anesthesia Post Note  Patient: Ann Robertson  Procedure(s) Performed: Procedure(s) (LRB): CESAREAN SECTION (N/A)  Patient location during evaluation: [facility] Anesthesia Type: Spinal Level of consciousness: oriented and awake and alert Pain management: pain level controlled Vital Signs Assessment: post-procedure vital signs reviewed and stable Respiratory status: spontaneous breathing and nonlabored ventilation Cardiovascular status: stable Postop Assessment: spinal receding, patient able to bend at knees, no signs of nausea or vomiting and adequate PO intake     Last Vitals:  Filed Vitals:   11/22/15 0152 11/22/15 0610  BP: 130/87 130/88  Pulse: 89 91  Temp: 37.1 C 36.8 C  Resp: 18 16    Last Pain:  Filed Vitals:   11/22/15 0705  PainSc: 0-No pain   Pain Goal:                 [employer]

## 2015-11-22 NOTE — Progress Notes (Signed)
Pt requested to see her baby multiple times but each time when we offered to take her she refused stating " give me some more time". We encouraged her to let us know whenever she is ready we will be happy to take her.  Pt was unable to go with FOB because she has two babies asleep in her room i.e. a 27 year old and a 27 year old. We continue to listen and provide as much support as possible.

## 2015-11-22 NOTE — Progress Notes (Signed)
Utilization review completed.  L. J. Flora Ratz RN, BSN, CM 

## 2015-11-22 NOTE — Lactation Note (Signed)
This note was copied from a baby's chart. Lactation Consultation Note  Patient Name: Ann Robertson [id number] Date: 11/22/2015 Reason for consult: Initial assessment;NICU baby   Initial consult with mom of 107 hour old NICU infant. Mom has not begun pumping and states that she "hates pumping". She has has 3 previous children in NICU and says she pumped for 2 weeks with each one. Showed mom how to hand express, she was able to return demonstration.   Mom is a [facility] client and agreed for me to send [facility] referral to [facility] office. [facility] referral sent to [facility]. Mom did not want pump set up at this time as she was going to NICU to see infant. Pump equipment placed in room and Coralyn Mark, RN was asked to set up when mom ready. Discussed supply and demand and enc mom to begin pumping every 2-3 hours for 15 minutes and follow with hand expression.   BF Resources Handout, Hand Expression Handout, and Providing Milk for Your Baby in NICU Booklet, colostrum collection containers and # stickers given. Reviewed Breast milk storage, pumping, milk coming to volume with mom.   Follow up tomorrow and prn.    Maternal Data Has patient been taught Hand Expression?: Yes Does the patient have breastfeeding experience prior to this delivery?: Yes  Feeding    LATCH Score/Interventions                      Lactation Tools Discussed/Used WIC Program: Yes   Consult Status Consult Status: Follow-up Date: 11/23/15 Follow-up type: In-patient    Debby Freiberg Bracha Frankowski 11/22/2015, 12:19 PM

## 2015-11-22 NOTE — Plan of Care (Signed)
Problem: Bowel/Gastric: Goal: Gastrointestinal status will improve Outcome: Completed/Met Date Met:  11/22/15 Schedule Senna

## 2015-11-22 NOTE — Plan of Care (Signed)
Problem: Respiratory: Goal: Ability to maintain adequate ventilation will improve Outcome: Completed/Met Date Met:  11/22/15 Encouraging IS

## 2015-11-23 LAB — GLUCOSE, CAPILLARY
GLUCOSE-CAPILLARY: 65 mg/dL (ref 65–99)
GLUCOSE-CAPILLARY: 82 mg/dL (ref 65–99)
Glucose-Capillary: 214 mg/dL — ABNORMAL HIGH (ref 65–99)
Glucose-Capillary: 136 mg/dL — ABNORMAL HIGH (ref 65–99)

## 2015-11-23 MED ORDER — INSULIN ASPART 100 UNIT/ML ~~LOC~~ SOLN
4.0000 [IU] | Freq: Once | SUBCUTANEOUS | Status: AC
  Administered 2015-11-23: 4 [IU] via SUBCUTANEOUS

## 2015-11-23 MED ORDER — INSULIN ASPART 100 UNIT/ML ~~LOC~~ SOLN
5.0000 [IU] | Freq: Once | SUBCUTANEOUS | Status: AC
  Administered 2015-11-23: 5 [IU] via SUBCUTANEOUS

## 2015-11-23 MED ORDER — IBUPROFEN 600 MG PO TABS: 600.0000 mg | ORAL_TABLET | Freq: Four times a day (QID) | ORAL | Status: DC

## 2015-11-23 MED ORDER — OXYCODONE-ACETAMINOPHEN 5-325 MG PO TABS: 1.0000 | ORAL_TABLET | ORAL | Status: DC | PRN

## 2015-11-23 NOTE — Discharge Instructions (Signed)
Cesarean Delivery, Care After  Refer to this sheet in the next few weeks. These instructions provide you with information on caring for yourself after your procedure. Your health care provider may also give you specific instructions. Your treatment has been planned according to current medical practices, but problems sometimes occur. Call your health care provider if you have any problems or questions after you go home.  HOME CARE INSTRUCTIONS   Only take over-the-counter or prescription medications as directed by your health care provider.   Do not drink alcohol, especially if you are breastfeeding or taking medication to relieve pain.   Do not chew or smoke tobacco.   Continue to use good perineal care. Good perineal care includes:    Wiping your perineum from front to back.    Keeping your perineum clean.   Check your surgical cut (incision) daily for increased redness, drainage, swelling, or separation of skin.   Clean your incision gently with soap and water every day, and then pat it dry. If your health care provider says it is okay, leave the incision uncovered. Use a bandage (dressing) if the incision is draining fluid or appears irritated. If the adhesive strips across the incision do not fall off within 7 days, carefully peel them off.   Hug a pillow when coughing or sneezing until your incision is healed. This helps to relieve pain.   Do not use tampons or douche until your health care provider says it is okay.   Shower, wash your hair, and take tub baths as directed by your health care provider.   Wear a well-fitting bra that provides breast support.   Limit wearing support panties or control-top hose.   Drink enough fluids to keep your urine clear or pale yellow.   Eat high-fiber foods such as whole grain cereals and breads, brown rice, beans, and fresh fruits and vegetables every day. These foods may help prevent or relieve constipation.   Resume activities such as climbing stairs,  driving, lifting, exercising, or traveling as directed by your health care provider.   Talk to your health care provider about resuming sexual activities. This is dependent upon your risk of infection, your rate of healing, and your comfort and desire to resume sexual activity.   Try to have someone help you with your household activities and your newborn for at least a few days after you leave the hospital.   Rest as much as possible. Try to rest or take a nap when your newborn is sleeping.   Increase your activities gradually.   Keep all of your scheduled postpartum appointments. It is very important to keep your scheduled follow-up appointments. At these appointments, your health care provider will be checking to make sure that you are healing physically and emotionally.  SEEK MEDICAL CARE IF:    You are passing large clots from your vagina. Save any clots to show your health care provider.   You have a foul smelling discharge from your vagina.   You have trouble urinating.   You are urinating frequently.   You have pain when you urinate.   You have a change in your bowel movements.   You have increasing redness, pain, or swelling near your incision.   You have pus draining from your incision.   Your incision is separating.   You have painful, hard, or reddened breasts.   You have a severe headache.   You have blurred vision or see spots.   You feel sad   or depressed.   You have thoughts of hurting yourself or your newborn.   You have questions about your care, the care of your newborn, or medications.   You are dizzy or light-headed.   You have a rash.   You have pain, redness, or swelling at the site of the removed intravenous access (IV) tube.   You have nausea or vomiting.   You stopped breastfeeding and have not had a menstrual period within 12 weeks of stopping.   You are not breastfeeding and have not had a menstrual period within 12 weeks of delivery.   You have a fever.  SEEK  IMMEDIATE MEDICAL CARE IF:   You have persistent pain.   You have chest pain.   You have shortness of breath.   You faint.   You have leg pain.   You have stomach pain.   Your vaginal bleeding saturates 2 or more sanitary pads in 1 hour.  MAKE SURE YOU:    Understand these instructions.   Will watch your condition.   Will get help right away if you are not doing well or get worse.     This information is not intended to replace advice given to you by your health care provider. Make sure you discuss any questions you have with your health care provider.     Document Released: 02/06/2002 Document Revised: 06/07/2014 Document Reviewed: 01/12/2012  Elsevier Interactive Patient Education 2016 Elsevier Inc.

## 2015-11-23 NOTE — Progress Notes (Signed)
CSW spoke with pt re: her transportation concerns.  She states that she is not able to afford bus fare to the hospital to visit her son in the NICU.  She further states that she is limited as to who could provide her with transportation to/from the hospital.  CSW will provide transportation for pt to get home today and a bus pass to come back in the am.  Unit CSW has been notified and will pt in the NICU.  Creta Levin, LCSW Weekend Coverage [id number]

## 2015-11-23 NOTE — Progress Notes (Signed)

## 2015-11-23 NOTE — Discharge Summary (Signed)
OB Discharge Summary     Patient Name: Ann Robertson DOB: 10/26/1990 MRN: [id number]  Date of admission: 11/21/2015 Delivering MD: Donnamae Jude   Date of discharge: 11/23/2015  Admitting diagnosis: 31WKS,CTX Intrauterine pregnancy: [redacted]w[redacted]d     Secondary diagnosis:  Active Problems:   Preterm labor   Postpartum care following cesarean delivery  Additional problems: Class F GDM; short pregnancy interval; prev cesarean section     Discharge diagnosis: Preterm Pregnancy Delivered                                                                                                Post partum procedures:postpartum tubal ligation   Augmentation: n/a  Complications: None  Hospital course:  Onset of Labor With Unplanned C/S  25 y.o. yo [id number] at [redacted]w[redacted]d was admitted in Active Labor on 11/21/2015. Patient had a labor course significant for preterm labor. Membrane Rupture Time/Date: 3:18 PM ,11/21/2015  Pt presented in active labor.  Had cervical change and subsequently underwent repeat c-section with BTL. See op note for full details.  The patient went for cesarean section due to Prior Uterine Surgery, and delivered a Viable infant,11/21/2015  Details of operation can be found in separate operative note. Patient had an uncomplicated postpartum course.  She is ambulating,tolerating a regular diet, passing flatus, and urinating well.  Patient is discharged home in stable condition 11/23/2015.  Physical exam  Filed Vitals:   11/22/15 1730 11/22/15 2206 11/23/15 0250 11/23/15 0539  BP: 133/93 138/84 135/96 132/88  Pulse: 106 106 119 100  Temp: 98.4 F (36.9 C) 98.8 F (37.1 C) 98.5 F (36.9 C) 98.5 F (36.9 C)  TempSrc: Oral Oral Oral Oral  Resp: 18 20 18 18   SpO2: 99% 97% 98% 98%   General: alert and no distress Lochia: appropriate Uterine Fundus: firm Incision: Healing well with no significant drainage, Dressing is clean, dry, and intact DVT Evaluation: No evidence of DVT seen on  physical exam. Labs: Lab Results  Component Value Date   WBC 11.4* 11/22/2015   HGB 9.3* 11/22/2015   HCT 27.2* 11/22/2015   MCV 90.1 11/22/2015   PLT 253 11/22/2015   CMP Latest Ref Rng 11/21/2015  Glucose 65 - 99 mg/dL 130(H)  BUN 6 - 20 mg/dL 11  Creatinine 0.44 - 1.00 mg/dL 0.71  Sodium 135 - 145 mmol/L 133(L)  Potassium 3.5 - 5.1 mmol/L 3.6  Chloride 101 - 111 mmol/L 99(L)  CO2 22 - 32 mmol/L 21(L)  Calcium 8.9 - 10.3 mg/dL 8.4(L)  Total Protein 6.5 - 8.1 g/dL 6.5  Total Bilirubin 0.3 - 1.2 mg/dL 0.4  Alkaline Phos 38 - 126 U/L 82  AST 15 - 41 U/L 20  ALT 14 - 54 U/L 11(L)    Discharge instruction: per After Visit Summary and "Baby and Me Booklet".  After visit meds:    Medication List    STOP taking these medications        metroNIDAZOLE 500 MG tablet  Commonly known as:  FLAGYL     NIFEdipine 30 MG 24 hr tablet  Commonly known as:  PROCARDIA XL      TAKE these medications        ibuprofen 600 MG tablet  Commonly known as:  ADVIL,MOTRIN  Take 1 tablet (600 mg total) by mouth every 6 (six) hours.     insulin aspart 100 UNIT/ML injection  Commonly known as:  novoLOG  56 units daily per v-go pump.     oxyCODONE-acetaminophen 5-325 MG tablet  Commonly known as:  PERCOCET/ROXICET  Take 1-2 tablets by mouth every 4 (four) hours as needed for moderate pain.       NOTE: pt has insulin pump.  Reports that her insulin is at the pharmacy and that she just needs to pick it up to restart her pump.  Diet: carb modified diet  Activity: Advance as tolerated. Pelvic rest for 6 weeks.   Outpatient follow up:2 weeks Follow up Appt:No future appointments. Follow up Visit:No Follow-up on file.  Postpartum contraception: Tubal Ligation  Newborn Data: Live born female  Birth Weight: 4 lb 1.3 oz (1850 g) APGAR: 8, 9  Baby Feeding: Bottle Disposition:NICU   11/23/2015 Lavonia Drafts, MD

## 2015-11-23 NOTE — Progress Notes (Signed)
Notified Dr Elly Modena that patient called out to have her glucose checked.CBG at 2200 was 72.Glucose was 214.Patient insisted that her sugar had to be covered because it was just going to keep going up.Order was given to give the patient a now dose of Novolog  4 units sq.Will repeat CBG in an hour.

## 2015-11-24 ENCOUNTER — Encounter: Payer: Self-pay | Admitting: *Deleted

## 2015-11-24 ENCOUNTER — Encounter (HOSPITAL_COMMUNITY): Payer: Self-pay | Admitting: Family Medicine

## 2015-11-25 ENCOUNTER — Ambulatory Visit (HOSPITAL_COMMUNITY): Payer: Medicaid Other

## 2015-11-27 ENCOUNTER — Encounter: Payer: Medicaid Other | Admitting: Obstetrics and Gynecology

## 2015-11-28 ENCOUNTER — Encounter (HOSPITAL_COMMUNITY): Payer: Self-pay | Admitting: *Deleted

## 2015-11-28 ENCOUNTER — Inpatient Hospital Stay (HOSPITAL_COMMUNITY)
Admission: AD | Admit: 2015-11-28 | Discharge: 2015-11-28 | Discharge status: Against Medical Advice | Payer: Medicaid Other | Source: Ambulatory Visit | Attending: Obstetrics & Gynecology | Admitting: Obstetrics & Gynecology

## 2015-11-28 DIAGNOSIS — E109 Type 1 diabetes mellitus without complications: Secondary | ICD-10-CM | POA: Diagnosis not present

## 2015-11-28 DIAGNOSIS — E7800 Pure hypercholesterolemia, unspecified: Secondary | ICD-10-CM | POA: Diagnosis not present

## 2015-11-28 DIAGNOSIS — R21 Rash and other nonspecific skin eruption: Secondary | ICD-10-CM | POA: Diagnosis not present

## 2015-11-28 DIAGNOSIS — O165 Unspecified maternal hypertension, complicating the puerperium: Secondary | ICD-10-CM

## 2015-11-28 DIAGNOSIS — L302 Cutaneous autosensitization: Secondary | ICD-10-CM

## 2015-11-28 DIAGNOSIS — O135 Gestational [pregnancy-induced] hypertension without significant proteinuria, complicating the puerperium: Secondary | ICD-10-CM

## 2015-11-28 DIAGNOSIS — O9089 Other complications of the puerperium, not elsewhere classified: Secondary | ICD-10-CM | POA: Diagnosis not present

## 2015-11-28 MED ORDER — LORATADINE 10 MG PO TABS
10.0000 mg | ORAL_TABLET | Freq: Once | ORAL | Status: AC
  Administered 2015-11-28: 10 mg via ORAL
  Filled 2015-11-28: qty 1

## 2015-11-28 MED ORDER — DEXAMETHASONE SODIUM PHOSPHATE 10 MG/ML IJ SOLN: 10.0000 mg | Freq: Once | INTRAMUSCULAR | Status: DC

## 2015-11-28 MED ORDER — DEXAMETHASONE SODIUM PHOSPHATE 10 MG/ML IJ SOLN
10.0000 mg | Freq: Once | INTRAMUSCULAR | Status: AC
  Administered 2015-11-28: 10 mg via INTRAMUSCULAR
  Filled 2015-11-28: qty 1

## 2015-11-28 NOTE — MAU Note (Signed)
Rash noted allover body.  Itches horribly.  First noted yesterday.  Raised red area. Otherwise she is doing great, baby is in NICU

## 2015-11-28 NOTE — MAU Note (Signed)
Unable to void at this time.

## 2015-11-28 NOTE — MAU Note (Signed)
Meds given as ordered. Dressing removed. Incision healing well. Steri strips intact.

## 2015-11-28 NOTE — MAU Note (Signed)
Patient refuses to have further assessment of elevated blood pressure, stating that is not what she came here for. Risks discussed at length by M. Jimmye Norman, CNM and RN. Patient continues to refuse further evaluation, signed AMA form and left in stable condition.

## 2015-11-28 NOTE — MAU Provider Note (Signed)
Chief Complaint:  Rash   First Provider Initiated Contact with Patient 11/28/15 1031      Ann Robertson is a 25 y.o. [id number] presents to maternity admissions reporting large area of rash with terrible itching.  Has been itching since surgery, but developed this rash over the past week. Rash is over abdomen, back and legs. She reports good fetal movement, denies LOF, vaginal bleeding, vaginal itching/burning, urinary symptoms, h/a, dizziness, n/v, diarrhea, constipation or fever/chills.  She denies headache, visual changes or RUQ abdominal pain.  C/S on 11/21/15 for preterm infant at 65 weeks   History is remarkable for Type I DM (Class F) with noncompliance. Also has had some hypertension but has refused workups.   Other This is a new problem. The current episode started in the past 7 days. The problem occurs constantly. The problem has been unchanged. Associated symptoms include a rash. Pertinent negatives include no abdominal pain, chills, fever, myalgias, nausea or vomiting. Associated symptoms comments: Skin rash with itching. Nothing aggravates the symptoms. She has tried nothing for the symptoms.   Past Medical History: Past Medical History  Diagnosis Date  . Diabetes mellitus   . Juvenile diabetes mellitus   . Juvenile diabetes 2000  . Neuropathy ([place])   . High cholesterol   . HSV-2 infection   . STD (sexually transmitted disease)   . Preeclampsia     Past obstetric history: OB History  Gravida Para Term Preterm AB SAB TAB Ectopic Multiple Living  5 4 0 4 1 1 0 0 0 4     # Outcome Date GA Lbr Len/2nd Weight Sex Delivery Anes PTL Lv  5 Preterm 11/21/15 [redacted]w[redacted]d   M CS-LTranv Spinal  Y     Comments: No gross anomalies seen.  4 Preterm 07/25/14 [redacted]w[redacted]d  7 lb 3.7 oz (3.28 kg) F CS-LTranv Spinal  Y  3 Preterm 07/05/13 [redacted]w[redacted]d  6 lb 7.9 oz (2.945 kg) M CS-Vac Spinal  Y  2 SAB 2014          1 Preterm 04/17/06 [redacted]w[redacted]d 14:00 5 lb 14 oz (2.665 kg) M CS-Unspec Spinal Y Y     Comments:  failure to progess; type 1 diabetes      Past Surgical History: Past Surgical History  Procedure Laterality Date  . Cesarean section    . Wisdom tooth extraction    . Cesarean section N/A 07/05/2013    Procedure: CESAREAN SECTION;  Surgeon: Emily Filbert, MD;  Location: [facility];  Service: Obstetrics;  Laterality: N/A;  . Cesarean section N/A 07/25/2014    Procedure: CESAREAN SECTION;  Surgeon: Donnamae Jude, MD;  Location: [facility];  Service: Obstetrics;  Laterality: N/A;  . Cesarean section N/A 11/21/2015    Procedure: CESAREAN SECTION;  Surgeon: Donnamae Jude, MD;  Location: [facility];  Service: Obstetrics;  Laterality: N/A;    Family History: Family History  Problem Relation Age of Onset  . Stroke Mother   . Hypertension Mother   . Heart disease Mother   . Kidney disease Mother   . Hyperlipidemia Father   . Diabetes Father     Social History: Social History  Substance Use Topics  . Smoking status: Current Every Day Smoker -- 0.25 packs/day for 9 years    Types: Cigarettes  . Smokeless tobacco: Never Used  . Alcohol Use: No    Allergies:  Allergies  Allergen Reactions  . Hydromorphone Hcl Itching and Rash    Meds:  Prescriptions prior to admission  Medication Sig Dispense Refill Last Dose  . ibuprofen (ADVIL,MOTRIN) 600 MG tablet Take 1 tablet (600 mg total) by mouth every 6 (six) hours. 30 tablet 0 11/27/2015 at Unknown time  . oxyCODONE-acetaminophen (PERCOCET/ROXICET) 5-325 MG tablet Take 1-2 tablets by mouth every 4 (four) hours as needed for moderate pain. 30 tablet 0 11/27/2015 at Unknown time  . insulin aspart (NOVOLOG) 100 UNIT/ML injection 56 units daily per v-go pump. 20 mL 2     I have reviewed patient's Past Medical Hx, Surgical Hx, Family Hx, Social Hx, medications and allergies.   ROS:  Review of Systems  Constitutional: Negative for fever and chills.  Gastrointestinal: Negative for nausea, vomiting, abdominal pain, diarrhea and constipation.   Genitourinary: Negative for vaginal bleeding and vaginal pain.  Musculoskeletal: Negative for myalgias.  Skin: Positive for rash.       itching   Other systems negative  Physical Exam  Patient Vitals for the past 24 hrs:  BP Temp Temp src Resp  11/28/15 1056 138/96 mmHg - - -  11/28/15 1016 151/97 mmHg 98.1 F (36.7 C) Oral 18   Constitutional: Well-developed, well-nourished female in no acute distress.  Cardiovascular: normal rate and rhythm Respiratory: normal effort, clear to auscultation bilaterally GI: Abd soft, non-tender, gravid appropriate for gestational age.   No rebound or guarding. MS: Extremities nontender, no edema, normal ROM Neurologic: Alert and oriented x 4. DTRs slightly brisk GU: Neg CVAT. SKIN:   There is a fine papular rash on abdomen, back, legs    Labs: No results found for this or any previous visit (from the past 24 hour(s)). --/--/O POS (06/23 1030)  Imaging:  No results found.  MAU Course/MDM  Consult Dr Harolyn Rutherford with presentation, exam findings and test results.   Discussed treatment of steroid injection,  But pt is a diabetic.  MD advises going ahead and giving shot.   Reviewed elevated BP, offered PIH eval to patient and warned of her risk for stroke and/or seizure if preeclamptic.  Refuses blood work or urine testing. Accepts risks. Wants to leave AMA after shot. Treatments in MAU included Steroid (Decadron) injection.  Dressing removed  Assessment: Post cesarean section x 1week Probable ID reaction  Plan: Signed out AMA due to refusing Preeclampsia workup  Follow up in Office for prenatal visits and recheck    Medication List    ASK your doctor about these medications        ibuprofen 600 MG tablet  Commonly known as:  ADVIL,MOTRIN  Take 1 tablet (600 mg total) by mouth every 6 (six) hours.     insulin aspart 100 UNIT/ML injection  Commonly known as:  novoLOG  56 units daily per v-go pump.     oxyCODONE-acetaminophen 5-325 MG  tablet  Commonly known as:  PERCOCET/ROXICET  Take 1-2 tablets by mouth every 4 (four) hours as needed for moderate pain.       Pt stable at time of discharge. Encouraged to return here or to other Urgent Care/ED if she develops worsening of symptoms, increase in pain, fever, or other concerning symptoms.      Hansel Feinstein CNM, MSN Certified Nurse-Midwife 11/28/2015 11:30 AM

## 2015-12-15 ENCOUNTER — Ambulatory Visit: Payer: Medicaid Other | Admitting: Obstetrics and Gynecology

## 2015-12-15 DIAGNOSIS — T814XXA Infection following a procedure, initial encounter: Principal | ICD-10-CM

## 2015-12-15 DIAGNOSIS — IMO0001 Reserved for inherently not codable concepts without codable children: Secondary | ICD-10-CM

## 2015-12-15 MED ORDER — SULFAMETHOXAZOLE-TRIMETHOPRIM 800-160 MG PO TABS: 1.0000 | ORAL_TABLET | Freq: Two times a day (BID) | ORAL | Status: AC

## 2015-12-15 NOTE — Discharge Summary (Signed)
Antenatal Physician Discharge Summary  Patient ID: Ann Robertson MRN: [id number] DOB/AGE: Oct 18, 1990 25 y.o.  Admit date: 11/16/2015 Discharge date: 11/16/2015  Admission Diagnoses: Pregnancy 30 weeks 4 days, preterm labor, poorly controlled class B diabetes Same, not delivered Discharge Diagnoses: Same, not delivered  Antenatal Unit Procedures: Betamethasone and magnesium sulfate    Treatments: steroids: Betamethasone and magnesium sulfate  Hospital Course:  This is a 25 y.o. [id number] with IUP admitted for preterm labor poorly controlled diabetes. She was admitted with contractions, noted to have a cervical exam of 3 cm.  No leaking of fluid and no bleeding.  She was initially started on  magnesium sulfate for tocolysis and neuroprotection and  also received betamethasone x one doses.   H She was she was transferred to [facility] due to lack of bed space. Our hospital and they ere she stayed without progress in her labor.   She was deemed stable for discharge from their facility the following day, she signed out AMA actually with her blood sugars still moderately elevated   She was subsequently discharge to home with outpatient follow up 1 week. [facility]  Discharge Exam: BP 118/70 mmHg  Pulse 127  Temp(Src) 98.2 F (36.8 C) (Oral)  Resp 18  Ht 5\' 4"  (1.626 m)  Wt 144 lb (65.318 kg)  BMI 24.71 kg/m2  LMP 03/15/2015 She was stable with cervix 3 cm contracting minimally, in response to magnesium tocolysis and was transferred via air care length to [facility]  Discharge Condition: Stable  Disposition: 07-Left Against Medical Advice or Left Without Being Seen     Medication List    Notice    You have not been prescribed any medications.       SignedJonnie Kind M.D. 12/15/2015, 7:13 PM

## 2015-12-15 NOTE — Progress Notes (Signed)
Patient ID: Ann Robertson, female   DOB: Mar 10, 1991, 25 y.o.   MRN: [id number] 25 y/o G5P5 3.5 wks post partum from LTCS. Pt reports for 1 week she has had increase redness and increasing pain. She has had discharge on her pad for the past several days. She denies fevers or chills. She does have type 1 DM. She is on insulin pump. Her last sugar was 124 this AM.   Physical Exam  Constitutional: She is well-developed, well-nourished, and in no distress.  Cardiovascular: Normal rate and normal heart sounds.   Pulmonary/Chest: Effort normal and breath sounds normal.  Abdominal: Soft. She exhibits no distension. There is no tenderness. There is no rebound.  Skin:  LTCS well healed on the left. Right edge of the wound open and indurated draining purulent. No erythema or warmth.     Review of Systems  Constitutional: Negative for fever, chills, weight loss and malaise/fatigue.  HENT: Negative for hearing loss and tinnitus.   Eyes: Negative for blurred vision, double vision and photophobia.  Cardiovascular: Negative for palpitations and orthopnea.  Gastrointestinal: Positive for nausea. Negative for vomiting.  Genitourinary: Negative for dysuria, urgency and frequency.  Neurological: Negative for headaches.   A/P Postoperative wound infection, initial encounter - Plan: sulfamethoxazole-trimethoprim (BACTRIM DS,SEPTRA DS) 800-160 MG tablet  Will have pt follow up in 48 hours to check for improvement.   Procedure note: 5 cc of 1% lidociane where used infiltrated above and below the incision. A small amount of purulent fluid was expressed from the wound and culture was collected. A 15 blade was used to extend the opening and the most lateral edge of the wound. No signficant purulent fluid was expressed after the procedure. Pt tolerated procedure. Bandage was placed over the wound prior to pt leaving the clinic.

## 2015-12-15 NOTE — Patient Instructions (Signed)
Wound Infection °A wound infection happens when a type of germ (bacteria) starts growing in the wound. In some cases, this can cause the wound to break open. If cared for properly, the infected wound will heal from the inside to the outside. Wound infections need treatment. °CAUSES °An infection is caused by bacteria growing in the wound.  °SYMPTOMS  °· Increase in redness, swelling, or pain at the wound site. °· Increase in drainage at the wound site. °· Wound or bandage (dressing) starts to smell bad. °· Fever. °· Feeling tired or fatigued. °· Pus draining from the wound. °TREATMENT  °Your health care provider will prescribe antibiotic medicine. The wound infection should improve within 24 to 48 hours. Any redness around the wound should stop spreading and the wound should be less painful.  °HOME CARE INSTRUCTIONS  °· Only take over-the-counter or prescription medicines for pain, discomfort, or fever as directed by your health care provider. °· Take your antibiotics as directed. Finish them even if you start to feel better. °· Gently wash the area with mild soap and water 2 times a day, or as directed. Rinse off the soap. Pat the area dry with a clean towel. Do not rub the wound. This may cause bleeding. °· Follow your health care provider's instructions for how often you need to change the dressing. °· Apply ointment and a dressing to the wound as directed. °· If the dressing sticks, moisten it with soapy water and gently remove it. °· Change the bandage right away if it becomes wet, dirty, or develops a bad smell. °· Take showers. Do not take tub baths, swim, or do anything that may soak the wound until it is healed. °· Avoid exercises that make you sweat heavily. °· Use anti-itch medicine as directed by your health care provider. The wound may itch when it is healing. Do not pick or scratch at the wound. °· Follow up with your health care provider to get your wound rechecked as directed. °SEEK MEDICAL CARE  IF: °· You have an increase in swelling, pain, or redness around the wound. °· You have an increase in the amount of pus coming from the wound. °· There is a bad smell coming from the wound. °· More of the wound breaks open. °· You have a fever. °MAKE SURE YOU:  °· Understand these instructions. °· Will watch your condition. °· Will get help right away if you are not doing well or get worse. °  °This information is not intended to replace advice given to you by your health care provider. Make sure you discuss any questions you have with your health care provider. °  °Document Released: 02/13/2003 Document Revised: 05/22/2013 Document Reviewed: 11/04/2014 °Elsevier Interactive Patient Education ©2016 Elsevier Inc. ° °

## 2015-12-17 ENCOUNTER — Encounter: Payer: Self-pay | Admitting: Family

## 2015-12-17 ENCOUNTER — Ambulatory Visit (INDEPENDENT_AMBULATORY_CARE_PROVIDER_SITE_OTHER): Payer: Medicaid Other | Admitting: Family

## 2015-12-17 ENCOUNTER — Other Ambulatory Visit: Payer: Self-pay | Admitting: Student

## 2015-12-17 ENCOUNTER — Other Ambulatory Visit (HOSPITAL_COMMUNITY): Payer: Self-pay | Admitting: Advanced Practice Midwife

## 2015-12-17 VITALS — BP 110/63 | HR 103 | Temp 98.1°F | Wt 130.8 lb

## 2015-12-17 DIAGNOSIS — IMO0001 Reserved for inherently not codable concepts without codable children: Secondary | ICD-10-CM

## 2015-12-17 DIAGNOSIS — O860 Infection of obstetric surgical wound: Secondary | ICD-10-CM

## 2015-12-17 DIAGNOSIS — T814XXD Infection following a procedure, subsequent encounter: Principal | ICD-10-CM

## 2015-12-17 MED ORDER — OXYCODONE-ACETAMINOPHEN 5-325 MG PO TABS: 1.0000 | ORAL_TABLET | ORAL | Status: DC | PRN

## 2015-12-17 MED ORDER — OXYCODONE-ACETAMINOPHEN 5-325 MG PO TABS: 1.0000 | ORAL_TABLET | Freq: Four times a day (QID) | ORAL | Status: DC | PRN

## 2015-12-17 NOTE — Progress Notes (Signed)
Pt states pharmacy told her Dr Baron Sane is not in Medicaid system yet  Jorje Guild and I both tried to print new Rxes but printers would not print  Will write one on a Rx pad   Seabron Spates, CNM

## 2015-12-17 NOTE — Patient Instructions (Signed)
Wound Infection °A wound infection happens when a type of germ (bacteria) starts growing in the wound. In some cases, this can cause the wound to break open. If cared for properly, the infected wound will heal from the inside to the outside. Wound infections need treatment. °CAUSES °An infection is caused by bacteria growing in the wound.  °SYMPTOMS  °· Increase in redness, swelling, or pain at the wound site. °· Increase in drainage at the wound site. °· Wound or bandage (dressing) starts to smell bad. °· Fever. °· Feeling tired or fatigued. °· Pus draining from the wound. °TREATMENT  °Your health care provider will prescribe antibiotic medicine. The wound infection should improve within 24 to 48 hours. Any redness around the wound should stop spreading and the wound should be less painful.  °HOME CARE INSTRUCTIONS  °· Only take over-the-counter or prescription medicines for pain, discomfort, or fever as directed by your health care provider. °· Take your antibiotics as directed. Finish them even if you start to feel better. °· Gently wash the area with mild soap and water 2 times a day, or as directed. Rinse off the soap. Pat the area dry with a clean towel. Do not rub the wound. This may cause bleeding. °· Follow your health care provider's instructions for how often you need to change the dressing. °· Apply ointment and a dressing to the wound as directed. °· If the dressing sticks, moisten it with soapy water and gently remove it. °· Change the bandage right away if it becomes wet, dirty, or develops a bad smell. °· Take showers. Do not take tub baths, swim, or do anything that may soak the wound until it is healed. °· Avoid exercises that make you sweat heavily. °· Use anti-itch medicine as directed by your health care provider. The wound may itch when it is healing. Do not pick or scratch at the wound. °· Follow up with your health care provider to get your wound rechecked as directed. °SEEK MEDICAL CARE  IF: °· You have an increase in swelling, pain, or redness around the wound. °· You have an increase in the amount of pus coming from the wound. °· There is a bad smell coming from the wound. °· More of the wound breaks open. °· You have a fever. °MAKE SURE YOU:  °· Understand these instructions. °· Will watch your condition. °· Will get help right away if you are not doing well or get worse. °  °This information is not intended to replace advice given to you by your health care provider. Make sure you discuss any questions you have with your health care provider. °  °Document Released: 02/13/2003 Document Revised: 05/22/2013 Document Reviewed: 11/04/2014 °Elsevier Interactive Patient Education ©2016 Elsevier Inc. ° °

## 2015-12-17 NOTE — Progress Notes (Addendum)
Patient ID: Ann Robertson, female   DOB: Jul 21, 1990, 25 y.o.   MRN: [id number] 25 y/o G5P5 3.5 wks post partum from LTCS. She is here for a 48 hour recheck of her wound today. She reports minimal imporvment but continued pain. She has taken 4 doses of bactrim. Denies fever chills nausea or vommitting.   Physical Exam  Constitutional: She is well-developed, well-nourished, and in no distress.  Cardiovascular: Normal rate and normal heart sounds.   Pulmonary/Chest: Effort normal and breath sounds normal.  Abdominal: Soft. She exhibits no distension. There is no tenderness. There is no rebound.  Skin:  LTCS well healed on the left. Right edge of the wound open minimal drainage at this point. Improved tenderness. No induration, no erythema.     Review of Systems  Constitutional: Negative for fever, chills, weight loss and malaise/fatigue.  HENT: Negative for hearing loss and tinnitus.   Eyes: Negative for blurred vision, double vision and photophobia.  Cardiovascular: Negative for palpitations and orthopnea.  Gastrointestinal: Positive for nausea. Negative for vomiting.  Genitourinary: Negative for dysuria, urgency and frequency.  Neurological: Negative for headaches.   A/P Wound infection after surgery, subsequent encounter - Plan: oxyCODONE-acetaminophen (PERCOCET/ROXICET) 5-325 MG tablet  Pt should follow up on Monday for final wound check. Cultures are still pending. Will provide percocet for pain relief.   Pt should continue bactrim for full 10 day course.

## 2015-12-18 LAB — WOUND CULTURE: Gram Stain: NONE SEEN

## 2015-12-22 ENCOUNTER — Ambulatory Visit: Payer: Medicaid Other

## 2015-12-22 DIAGNOSIS — Z5189 Encounter for other specified aftercare: Secondary | ICD-10-CM

## 2015-12-22 NOTE — Progress Notes (Signed)
Patient presented for a wound check today. Patient recently had a c-section and it become infected. The wound looks good today. No drainage at this time. Patient was advised to continued taking medication for infection. Patient verbalizes understanding and will follow up in two weeks.

## 2016-01-09 ENCOUNTER — Telehealth: Payer: Self-pay | Admitting: Endocrinology

## 2016-01-09 MED ORDER — FLUCONAZOLE 150 MG PO TABS: 150.0000 mg | ORAL_TABLET | Freq: Once | ORAL | 1 refills | Status: AC

## 2016-01-09 MED ORDER — INSULIN ASPART 100 UNIT/ML ~~LOC~~ SOLN: SUBCUTANEOUS | 0 refills | Status: DC

## 2016-01-09 NOTE — Telephone Encounter (Signed)
Rx for novolog submitted. Dr. Cruzita Lederer could you review and please advise if we could send a rx in for the pt for her yeast infection during Dr. Ahmed Prima absence? Thanks!

## 2016-01-09 NOTE — Telephone Encounter (Signed)
Rx submitted and pt advised.

## 2016-01-09 NOTE — Telephone Encounter (Signed)
OK to send Diflucan 150 mg #1 with 1 refill.

## 2016-01-09 NOTE — Telephone Encounter (Signed)
Patient need refill of medication Novalog and something for a yeast infection. She ask if she can get today, she is feeling bad today. [place], [place]. [phone] (Phone) [phone] (Fax)   She ask if you would Please give her a call.

## 2016-01-16 ENCOUNTER — Ambulatory Visit: Payer: Medicaid Other | Admitting: Family Medicine

## 2016-01-16 ENCOUNTER — Ambulatory Visit: Payer: Medicaid Other | Admitting: Endocrinology

## 2016-01-16 DIAGNOSIS — Z0289 Encounter for other administrative examinations: Secondary | ICD-10-CM

## 2016-02-24 ENCOUNTER — Encounter: Payer: Self-pay | Admitting: *Deleted

## 2016-02-26 ENCOUNTER — Encounter: Payer: Self-pay | Admitting: Endocrinology

## 2016-02-26 ENCOUNTER — Ambulatory Visit (INDEPENDENT_AMBULATORY_CARE_PROVIDER_SITE_OTHER): Payer: Medicaid Other | Admitting: Endocrinology

## 2016-02-26 VITALS — BP 114/70 | HR 98 | Ht 64.0 in | Wt 126.0 lb

## 2016-02-26 DIAGNOSIS — O24013 Pre-existing diabetes mellitus, type 1, in pregnancy, third trimester: Secondary | ICD-10-CM

## 2016-02-26 LAB — POCT GLYCOSYLATED HEMOGLOBIN (HGB A1C): Hemoglobin A1C: 11.7

## 2016-02-26 LAB — GLUCOSE, POCT (MANUAL RESULT ENTRY): POC Glucose: 544 mg/dl — AB (ref 70–99)

## 2016-02-26 MED ORDER — INSULIN GLARGINE 100 UNIT/ML SOLOSTAR PEN: 15.0000 [IU] | PEN_INJECTOR | SUBCUTANEOUS | 99 refills | Status: DC
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
## 2016-02-26 NOTE — Patient Instructions (Addendum)
check your blood sugar twice a day: before the 3 meals, after meals on a rotating basis, and at bedtime.  also check if you have symptoms of your blood sugar being too high or too low.  please keep a record of the readings and bring it to your next appointment here.  You can write it on any piece of paper.  please call us sooner if your blood sugar goes below 70, or if you have a lot of readings over 200. I have sent a prescription to your pharmacy, to change the insulin to lantus, once a day.  On this type of insulin schedule, you should eat meals on a regular schedule.  If a meal is missed or significantly delayed, your blood sugar could go low. In view of your medical condition, you should avoid pregnancy until we have decided it is safe.   If it is negative, please come back for a follow-up appointment in 2 weeks.

## 2016-03-09 ENCOUNTER — Telehealth: Payer: Self-pay | Admitting: Endocrinology

## 2016-03-09 MED ORDER — FLUCONAZOLE 150 MG PO TABS: 150.0000 mg | ORAL_TABLET | Freq: Once | ORAL | 0 refills | Status: DC

## 2016-03-09 NOTE — Telephone Encounter (Signed)
Patient stated the reason she is in need for the yeast infection is due to her sugar running high and being excreted in her urine. Please advise, Thanks!

## 2016-03-09 NOTE — Telephone Encounter (Signed)
Pt is asking if we could rx her a pill for a yeast infection please

## 2016-03-09 NOTE — Telephone Encounter (Signed)
See message and please advise, Thanks!  

## 2016-03-09 NOTE — Telephone Encounter (Signed)
Please refer request to PCP 

## 2016-03-09 NOTE — Telephone Encounter (Signed)
Please increase lantus to 18 units qam I have sent a prescription to your pharmacy, for the yeast infection. Ov < 2 weeks.

## 2016-03-10 MED ORDER — GLUCOSE BLOOD VI STRP: ORAL_STRIP | 12 refills | Status: DC

## 2016-03-10 NOTE — Telephone Encounter (Signed)
I contacted the patient and advised of message. Patient voiced understanding and stated she would call back to reschedule her appointment.

## 2016-03-15 ENCOUNTER — Other Ambulatory Visit: Payer: Self-pay

## 2016-03-15 MED ORDER — GLUCOSE BLOOD VI STRP: ORAL_STRIP | 2 refills | Status: DC

## 2016-03-15 MED ORDER — ACCU-CHEK FASTCLIX LANCETS MISC: 2 refills | Status: DC

## 2016-03-15 MED ORDER — ACCU-CHEK AVIVA PLUS W/DEVICE KIT: PACK | 2 refills | Status: DC
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
## 2016-04-26 ENCOUNTER — Telehealth: Payer: Self-pay | Admitting: Endocrinology

## 2016-04-26 MED ORDER — INSULIN GLARGINE 100 UNIT/ML ~~LOC~~ SOLN: 18.0000 [IU] | Freq: Every day | SUBCUTANEOUS | 2 refills | Status: DC

## 2016-04-26 MED ORDER — "INSULIN SYRINGE 31G X 5/16"" 1 ML MISC": 2 refills | Status: DC

## 2016-04-26 MED ORDER — TRUETRACK BLOOD GLUCOSE W/DEVICE KIT: PACK | 2 refills | Status: DC

## 2016-04-26 MED ORDER — GLUCAGON (RDNA) 1 MG IJ KIT: 1.0000 mg | PACK | Freq: Once | INTRAMUSCULAR | 3 refills | Status: DC | PRN

## 2016-04-26 NOTE — Telephone Encounter (Signed)
I contacted the patient and advised we have submitted the refill the meter. Patient wanted to know if we could also send a refill in for a glucagon emergency kit? I did not locate this on her current med list. Ok to refill?

## 2016-04-26 NOTE — Telephone Encounter (Signed)
This is the patient who lost her meter ans want to know can she get another one.

## 2016-04-26 NOTE — Telephone Encounter (Signed)
I have sent a prescription to your pharmacy  

## 2016-04-27 ENCOUNTER — Ambulatory Visit: Payer: Medicaid Other | Admitting: Endocrinology

## 2016-04-27 NOTE — Telephone Encounter (Signed)
I contacted the patient and advised via voicemail we have submitted the prescription.

## 2016-05-19 NOTE — Telephone Encounter (Signed)
Pt needs refills on fluconazole called into [facility] please

## 2016-05-20 MED ORDER — FLUCONAZOLE 150 MG PO TABS: 150.0000 mg | ORAL_TABLET | Freq: Once | ORAL | 1 refills | Status: DC

## 2016-05-20 NOTE — Telephone Encounter (Signed)
See message and please advise during Dr. Ellison's absence. Thanks!  

## 2016-05-20 NOTE — Telephone Encounter (Signed)
OK 

## 2016-05-20 NOTE — Telephone Encounter (Signed)
Refill submitted. 

## 2016-06-28 ENCOUNTER — Telehealth: Payer: Self-pay | Admitting: Endocrinology

## 2016-06-28 NOTE — Telephone Encounter (Signed)
I contacted the patient and advised of message. Patient scheduled for 07/08/2016.

## 2016-06-28 NOTE — Telephone Encounter (Signed)
Patient stated that her b/s are high and al over the place, please advise on what to do.

## 2016-06-28 NOTE — Telephone Encounter (Signed)
Ov next available 

## 2016-06-28 NOTE — Telephone Encounter (Signed)
See message and please advise, Thanks!  

## 2016-07-08 ENCOUNTER — Ambulatory Visit: Payer: Medicaid Other | Admitting: Endocrinology

## 2016-10-19 ENCOUNTER — Ambulatory Visit: Payer: Medicaid Other | Admitting: Endocrinology

## 2016-10-20 ENCOUNTER — Encounter: Payer: Self-pay | Admitting: Endocrinology

## 2016-10-20 ENCOUNTER — Ambulatory Visit (INDEPENDENT_AMBULATORY_CARE_PROVIDER_SITE_OTHER): Payer: Medicaid Other | Admitting: Endocrinology

## 2016-10-20 VITALS — BP 122/72 | HR 91 | Ht 64.0 in | Wt 124.0 lb

## 2016-10-20 DIAGNOSIS — O24019 Pre-existing diabetes mellitus, type 1, in pregnancy, unspecified trimester: Secondary | ICD-10-CM

## 2016-10-20 LAB — POCT GLYCOSYLATED HEMOGLOBIN (HGB A1C): HEMOGLOBIN A1C: 14.5

## 2016-10-20 MED ORDER — FLUCONAZOLE 150 MG PO TABS: 150.0000 mg | ORAL_TABLET | Freq: Once | ORAL | 1 refills | Status: AC

## 2016-10-20 MED ORDER — INSULIN GLARGINE 100 UNIT/ML ~~LOC~~ SOLN: 18.0000 [IU] | Freq: Every day | SUBCUTANEOUS | 11 refills | Status: DC

## 2016-10-20 NOTE — Patient Instructions (Addendum)
check your blood sugar twice a day: before the 3 meals, after meals on a rotating basis, and at bedtime.  also check if you have symptoms of your blood sugar being too high or too low.  please keep a record of the readings and bring it to your next appointment here.  You can write it on any piece of paper.  please call us sooner if your blood sugar goes below 70, or if you have a lot of readings over 200. I have sent a prescription to your pharmacy, to refill the lantus.  On this type of insulin schedule, you should eat meals on a regular schedule.  If a meal is missed or significantly delayed, your blood sugar could go low. In view of your medical condition, you should avoid pregnancy until we have decided it is safe.   I have also sent a prescription to your pharmacy, for the fluconazole.  If it is negative, please come back for a follow-up appointment in 2 months.

## 2016-10-20 NOTE — Progress Notes (Signed)
Subjective:    Patient ID: Ann Robertson, female    DOB: Oct 11, 1990, 26 y.o.   MRN: [id number]  HPI Pt returns for f/u of diabetes mellitus: DM type: 1 Dx'ed: 6387 Complications: polyneuropathy and nephropathy. Therapy: insulin since dx DKA: last episode was in 2008.  Severe hypoglycemia: last episode was approx 2012.   Pancreatitis: never.   Other: she took pump rx only during the 2014-2015 pregnancy, and V-GO device during 2016 pregnancy; she takes qd insulin, after poor results with multiple daily injections.   Interval history: She takes insulin intermittently, due to lack of insulin.  She says despite insurance, she cannot afford $3 copay until she started her new job 3 weeks ago. pt states she feels well in general, except for vaginal itching.   Past Medical History:  Diagnosis Date  . Diabetes mellitus   . High cholesterol   . HSV-2 infection   . Juvenile diabetes 2000  . Juvenile diabetes mellitus   . Neuropathy   . Preeclampsia   . STD (sexually transmitted disease)     Past Surgical History:  Procedure Laterality Date  . CESAREAN SECTION    . CESAREAN SECTION N/A 07/05/2013   Procedure: CESAREAN SECTION;  Surgeon: Emily Filbert, MD;  Location: [facility];  Service: Obstetrics;  Laterality: N/A;  . CESAREAN SECTION N/A 07/25/2014   Procedure: CESAREAN SECTION;  Surgeon: Donnamae Jude, MD;  Location: [facility];  Service: Obstetrics;  Laterality: N/A;  . CESAREAN SECTION N/A 11/21/2015   Procedure: CESAREAN SECTION;  Surgeon: Donnamae Jude, MD;  Location: [facility];  Service: Obstetrics;  Laterality: N/A;  . WISDOM TOOTH EXTRACTION      Social History   Social History  . Marital status: Single    Spouse name: N/A  . Number of children: N/A  . Years of education: N/A   Occupational History  . Not on file.   Social History Main Topics  . Smoking status: Current Every Day Smoker    Packs/day: 0.25    Years: 9.00    Types: Cigarettes  . Smokeless tobacco:  Never Used  . Alcohol use No  . Drug use: No  . Sexual activity: Not Currently    Birth control/ protection: None   Other Topics Concern  . Not on file   Social History Narrative  . No narrative on file    Current Outpatient Prescriptions on File Prior to Visit  Medication Sig Dispense Refill  . ACCU-CHEK FASTCLIX LANCETS MISC Use to check blood sugar 2 times per day. 102 each 2  . Blood Glucose Monitoring Suppl (ACCU-CHEK AVIVA PLUS) w/Device KIT Use to check blood sugar 2 times per day. 1 kit 2  . Blood Glucose Monitoring Suppl (TRUETRACK BLOOD GLUCOSE) w/Device KIT Use to check blood sugar 2 times per day. 1 kit 2  . glucose blood (ACCU-CHEK AVIVA PLUS) test strip Use to check blood sugar 2 times per day. 200 each 2  . glucose blood (TRUETRACK TEST) test strip Use to check blood sugar 2 times per day. 250 each 12  . Insulin Syringe-Needle U-100 (INSULIN SYRINGE 1CC/31GX5/16") 31G X 5/16" 1 ML MISC Use to check blood sugar 1 time per day. 100 each 2  . glucagon (GLUCAGON EMERGENCY) 1 MG injection Inject 1 mg into the muscle once as needed. (Patient not taking: Reported on 10/20/2016) 1 each 3   No current facility-administered medications on file prior to visit.     Allergies  Allergen Reactions  .  Hydromorphone Hcl Itching and Rash    Family History  Problem Relation Age of Onset  . Stroke Mother   . Hypertension Mother   . Heart disease Mother   . Kidney disease Mother   . Hyperlipidemia Father   . Diabetes Father     BP 122/72   Pulse 91   Ht _0  (1.626 m)   Wt 124 lb (56.2 kg)   SpO2 96%   BMI 21.28 kg/m   Review of Systems She has lost a few lbs.     Objective:   Physical Exam VITAL SIGNS:  See vs page GENERAL: no distress Pulses: dorsalis pedis intact bilat.   MSK: no deformity of the feet CV: no leg edema Skin:  no ulcer on the feet.  normal color and temp on the feet. Neuro: sensation is intact to touch on the feet  Lab Results  Component  Value Date   HGBA1C 14.5 10/20/2016      Assessment & Plan:  Type 1 DM, with polyneuropathy: worse Vaginitis, prob due to severe hyperglycemia Noncompliance with cbg recording and insulin.  Pt says economic reasons.  We discussed risks  Patient Instructions  check your blood sugar twice a day: before the 3 meals, after meals on a rotating basis, and at bedtime.  also check if you have symptoms of your blood sugar being too high or too low.  please keep a record of the readings and bring it to your next appointment here.  You can write it on any piece of paper.  please call us sooner if your blood sugar goes below 70, or if you have a lot of readings over 200. I have sent a prescription to your pharmacy, to refill the lantus.  On this type of insulin schedule, you should eat meals on a regular schedule.  If a meal is missed or significantly delayed, your blood sugar could go low. In view of your medical condition, you should avoid pregnancy until we have decided it is safe.   I have also sent a prescription to your pharmacy, for the fluconazole.  If it is negative, please come back for a follow-up appointment in 2 months.

## 2017-02-09 ENCOUNTER — Telehealth: Payer: Self-pay | Admitting: Endocrinology

## 2017-02-09 MED ORDER — BASAGLAR KWIKPEN 100 UNIT/ML ~~LOC~~ SOPN
18.0000 [IU] | PEN_INJECTOR | Freq: Every day | SUBCUTANEOUS | 2 refills | Status: DC
Start: 2017-02-09 — End: 2017-02-16

## 2017-02-09 NOTE — Telephone Encounter (Signed)
Patient would like a call back RE medication refill.  Ty,  -LL

## 2017-02-09 NOTE — Telephone Encounter (Signed)
Spoke to pt, said she is at the pharmacy right now and needs refill on her Lantus and pharmacy said it is not covered. Spoke to pharmacist asked him if Nancee Liter is covered? Pharmacist said I do not know you will need to send new Rx. Told him okay will send new Rx over right away for pt. Rx for Basaglar 100 unit/ml with same directions sent to pharmacy.

## 2017-02-15 ENCOUNTER — Other Ambulatory Visit: Payer: Self-pay

## 2017-02-16 ENCOUNTER — Telehealth: Payer: Self-pay | Admitting: Endocrinology

## 2017-02-16 ENCOUNTER — Other Ambulatory Visit: Payer: Self-pay

## 2017-02-16 MED ORDER — BASAGLAR KWIKPEN 100 UNIT/ML ~~LOC~~ SOPN: 18.0000 [IU] | PEN_INJECTOR | Freq: Every day | SUBCUTANEOUS | 1 refills | Status: DC

## 2017-02-16 NOTE — Telephone Encounter (Signed)
Please refill x 1 Ov is due  

## 2017-02-16 NOTE — Telephone Encounter (Signed)
MEDICATION: Insulin Glargine (BASAGLAR KWIKPEN) 100 UNIT/ML SOPN  PHARMACY:   [place], [place]. [phone] (Phone) [phone] (Fax)     IS THIS A 90 DAY SUPPLY : N/A  IS PATIENT OUT OF MEDICATION: Y  IF NOT; HOW MUCH IS LEFT:   LAST APPOINTMENT DATE: 10/20/16  NEXT APPOINTMENT DATE: N/A  OTHER COMMENTS: Patient stated Rx was originally sent to wrong pharmacy.    **Let patient know to contact pharmacy at the end of the day to make sure medication is ready. **  ** Please notify patient to allow 48-72 hours to process**  **Encourage patient to contact the pharmacy for refills or they can request refills through [facility]**

## 2017-02-17 ENCOUNTER — Other Ambulatory Visit: Payer: Self-pay

## 2017-02-17 MED ORDER — BASAGLAR KWIKPEN 100 UNIT/ML ~~LOC~~ SOPN: 18.0000 [IU] | PEN_INJECTOR | Freq: Every day | SUBCUTANEOUS | 1 refills | Status: DC

## 2017-02-17 MED ORDER — INSULIN PEN NEEDLE 31G X 5 MM MISC: 0 refills | Status: DC

## 2017-02-17 MED ORDER — GLUCOSE BLOOD VI STRP: ORAL_STRIP | 2 refills | Status: DC

## 2017-02-17 NOTE — Telephone Encounter (Signed)
Spoke with patient & sent in prescription for basaglar, test strips as well as pen needles. Switched to basaglar because of insurance.

## 2017-02-21 ENCOUNTER — Other Ambulatory Visit: Payer: Self-pay

## 2017-02-21 ENCOUNTER — Telehealth: Payer: Self-pay | Admitting: Endocrinology

## 2017-02-21 ENCOUNTER — Telehealth: Payer: Self-pay

## 2017-02-21 NOTE — Telephone Encounter (Signed)
Called pateint back after calling [facility]. Patient only has medicaid family planning, so that's why Lantus isn't covered. She said that she would investigate what was going on with medicaid because she stated she is supposed to have regular medicaid. She will still be picking up some Lantus samples until things are figured out.

## 2017-02-21 NOTE — Telephone Encounter (Signed)
Patient states that she is unable to get her medication filled because the dx code is missing and  that she has been without insulin for 4 days.Patient states that she in unable to test her blood sugar but can tell that it is high. Please call: [place], [place]. [phone] (Phone) [phone] (Fax)

## 2017-02-21 NOTE — Telephone Encounter (Signed)
Called patient & notified her that I would have to submit PA to [facility] for Lantus insulin. She is coming today to pick up some samples until I can get PA approved.

## 2017-02-21 NOTE — Telephone Encounter (Signed)
Called patient and she is coming in office today to pick up some samples of Lantus until she can get her medicaid straight.

## 2017-02-25 ENCOUNTER — Other Ambulatory Visit: Payer: Self-pay

## 2017-02-25 MED ORDER — INSULIN GLARGINE 100 UNIT/ML SOLOSTAR PEN: 18.0000 [IU] | PEN_INJECTOR | Freq: Every day | SUBCUTANEOUS | 99 refills | Status: DC

## 2017-02-28 ENCOUNTER — Telehealth: Payer: Self-pay | Admitting: *Deleted

## 2017-02-28 ENCOUNTER — Other Ambulatory Visit: Payer: Self-pay

## 2017-02-28 MED ORDER — GLUCOSE BLOOD VI STRP: ORAL_STRIP | 2 refills | Status: DC

## 2017-02-28 MED ORDER — INSULIN GLARGINE 100 UNIT/ML SOLOSTAR PEN: 18.0000 [IU] | PEN_INJECTOR | Freq: Every day | SUBCUTANEOUS | 99 refills | Status: DC

## 2017-02-28 NOTE — Telephone Encounter (Signed)
Ov is due.  Let's discuss then.

## 2017-02-28 NOTE — Telephone Encounter (Signed)
Called patient but she needs prescription for fluconazole. Patient stated that she is very uncomfortable & has to work this way. If she could be called in this prescription she would greatly appreciate it. I also sent in lantus & test strips to pharmacy for her.

## 2017-02-28 NOTE — Telephone Encounter (Signed)
Called patient & she stated she would most likely go into an urgent care.

## 2017-02-28 NOTE — Telephone Encounter (Signed)
Patient called and states she needs a refill of her Lantus and her Glucogone . [facility] pharmacy is [facility] [address]. Please call patient to clarify the medciation she needs refill of. Her contact number is [phone]  Please advise.

## 2017-03-09 ENCOUNTER — Other Ambulatory Visit: Payer: Self-pay

## 2017-03-09 MED ORDER — GLUCOSE BLOOD VI STRP: ORAL_STRIP | 2 refills | Status: DC

## 2017-07-06 ENCOUNTER — Ambulatory Visit: Payer: Self-pay | Admitting: Endocrinology

## 2017-08-08 ENCOUNTER — Encounter: Payer: Self-pay | Admitting: *Deleted

## 2017-10-04 ENCOUNTER — Telehealth: Payer: Self-pay | Admitting: Endocrinology

## 2017-10-04 NOTE — Telephone Encounter (Signed)
I called and let patient know that f/u was overdue, but that from a DM standpoint that patient wasn't disabled.

## 2017-10-04 NOTE — Telephone Encounter (Signed)
Pt called about wanting to be on disability and needs paperwork stating that pt illness and that it could possible lead to death and how long it will last. Please call pt for more information and the she was dx.  Please advise?  Call pt @ [phone]. Thank you!

## 2017-10-04 NOTE — Telephone Encounter (Signed)
I spoke with patient & she was wanting a letter stating that she was a type 1 diabetic. She needed it to state that this debilitating for her & could lead to eventual death. I advised patient that she just needed to come in for her visit on 5/16 because she hasn't been seen in a year. Please advise?

## 2017-10-04 NOTE — Telephone Encounter (Signed)
F/u ov is due I only see pt for DM, and she is not disabled from that standpoint.

## 2017-10-13 ENCOUNTER — Encounter: Payer: Self-pay | Admitting: Endocrinology

## 2017-10-13 ENCOUNTER — Ambulatory Visit (INDEPENDENT_AMBULATORY_CARE_PROVIDER_SITE_OTHER): Payer: Medicaid Other | Admitting: Endocrinology

## 2017-10-13 VITALS — BP 112/70 | HR 90 | Ht 64.0 in | Wt 118.0 lb

## 2017-10-13 DIAGNOSIS — O24019 Pre-existing diabetes mellitus, type 1, in pregnancy, unspecified trimester: Secondary | ICD-10-CM | POA: Diagnosis not present

## 2017-10-13 LAB — POCT GLYCOSYLATED HEMOGLOBIN (HGB A1C)

## 2017-10-13 MED ORDER — INSULIN GLARGINE 100 UNIT/ML SOLOSTAR PEN
18.0000 [IU] | PEN_INJECTOR | SUBCUTANEOUS | 99 refills | Status: DC
Start: 2017-10-13 — End: 2017-10-17

## 2017-10-13 MED ORDER — ACCU-CHEK AVIVA PLUS W/DEVICE KIT: 1.0000 | PACK | Freq: Once | 0 refills | Status: AC

## 2017-10-13 MED ORDER — GLUCOSE BLOOD VI STRP: 1.0000 | ORAL_STRIP | Freq: Two times a day (BID) | 12 refills | Status: DC

## 2017-10-13 NOTE — Progress Notes (Signed)
Subjective:    Patient ID: Ann Robertson, female    DOB: Dec 12, 1990, 27 y.o.   MRN: [id number]  HPI Pt returns for f/u of diabetes mellitus: DM type: 1 Dx'ed: 2353 Complications: polyneuropathy and nephropathy. Therapy: insulin since dx DKA: last episode was in 2008.  Severe hypoglycemia: last episode was approx 2012.   Pancreatitis: never.   Other: she took pump rx only during the 2014-2015 pregnancy, and V-GO device during 2016 pregnancy; she takes qd insulin, after poor results with multiple daily injections.   Interval history: She does not check cbg's.  She takes insulin intermittently.  Main symptom is back pain.   Past Medical History:  Diagnosis Date  . Diabetes mellitus   . High cholesterol   . HSV-2 infection   . Juvenile diabetes 2000  . Juvenile diabetes mellitus   . Neuropathy   . Preeclampsia   . STD (sexually transmitted disease)     Past Surgical History:  Procedure Laterality Date  . CESAREAN SECTION    . CESAREAN SECTION N/A 07/05/2013   Procedure: CESAREAN SECTION;  Surgeon: Emily Filbert, MD;  Location: [facility];  Service: Obstetrics;  Laterality: N/A;  . CESAREAN SECTION N/A 07/25/2014   Procedure: CESAREAN SECTION;  Surgeon: Donnamae Jude, MD;  Location: [facility];  Service: Obstetrics;  Laterality: N/A;  . CESAREAN SECTION N/A 11/21/2015   Procedure: CESAREAN SECTION;  Surgeon: Donnamae Jude, MD;  Location: [place] [facility];  Service: Obstetrics;  Laterality: N/A;  . WISDOM TOOTH EXTRACTION      Social History   Socioeconomic History  . Marital status: Single    Spouse name: Not on file  . Number of children: Not on file  . Years of education: Not on file  . Highest education level: Not on file  Occupational History  . Not on file  Social Needs  . Financial resource strain: Not on file  . Food insecurity:    Worry: Not on file    Inability: Not on file  . Transportation needs:    Medical: Not on file    Non-medical: Not on file  Tobacco Use    . Smoking status: Current Every Day Smoker    Packs/day: 0.25    Years: 9.00    Pack years: 2.25    Types: Cigarettes  . Smokeless tobacco: Never Used  Substance and Sexual Activity  . Alcohol use: No  . Drug use: No  . Sexual activity: Not Currently    Birth control/protection: None  Lifestyle  . Physical activity:    Days per week: Not on file    Minutes per session: Not on file  . Stress: Not on file  Relationships  . Social connections:    Talks on phone: Not on file    Gets together: Not on file    Attends religious service: Not on file    Active member of club or organization: Not on file    Attends meetings of clubs or organizations: Not on file    Relationship status: Not on file  . Intimate partner violence:    Fear of current or ex partner: Not on file    Emotionally abused: Not on file    Physically abused: Not on file    Forced sexual activity: Not on file  Other Topics Concern  . Not on file  Social History Narrative  . Not on file    No current outpatient medications on file prior to visit.  No current facility-administered medications on file prior to visit.     Allergies  Allergen Reactions  . Hydromorphone Hcl Itching and Rash    Family History  Problem Relation Age of Onset  . Stroke Mother   . Hypertension Mother   . Heart disease Mother   . Kidney disease Mother   . Hyperlipidemia Father   . Diabetes Father     BP 112/70   Pulse 90   Ht 5\' 4"  (1.626 m)   Wt 118 lb (53.5 kg)   SpO2 95%   BMI 20.25 kg/m   Review of Systems Denies n/v/sob.      Objective:   Physical Exam VITAL SIGNS:  See vs page GENERAL: no distress.  Pulses: foot pulses are intact bilaterally.   MSK: no deformity of the feet or ankles.  CV: no edema of the legs or ankles Skin:  no ulcer on the feet or ankles.  normal color and temp on the feet and ankles Neuro: sensation is intact to touch on the feet and ankles.     A1c> 15%  Lab Results  Component  Value Date   CREATININE 0.71 11/21/2015   BUN 11 11/21/2015   NA 133 (L) 11/21/2015   K 3.6 11/21/2015   CL 99 (L) 11/21/2015   CO2 21 (L) 11/21/2015       Assessment & Plan:  Type 1 DM, with nephropathy: worse Noncompliance with cbg monitoring and insulin.  We discussed.   Patient Instructions  check your blood sugar twice a day: before the 3 meals, after meals on a rotating basis, and at bedtime.  also check if you have symptoms of your blood sugar being too high or too low.  please keep a record of the readings and bring it to your next appointment here.  You can write it on any piece of paper.  please call us sooner if your blood sugar goes below 70, or if you have a lot of readings over 200. I have sent prescriptions to your pharmacy: to refill the lantus, for a meter, and strips.  On this type of insulin schedule, you should eat meals on a regular schedule.  If a meal is missed or significantly.  delayed, your blood sugar could go low. In view of your medical condition, you should avoid pregnancy until we have decided it is safe.   If it is negative, please come back for a follow-up appointment in 2 weeks.

## 2017-10-13 NOTE — Patient Instructions (Addendum)
check your blood sugar twice a day: before the 3 meals, after meals on a rotating basis, and at bedtime.  also check if you have symptoms of your blood sugar being too high or too low.  please keep a record of the readings and bring it to your next appointment here.  You can write it on any piece of paper.  please call us sooner if your blood sugar goes below 70, or if you have a lot of readings over 200. I have sent prescriptions to your pharmacy: to refill the lantus, for a meter, and strips.  On this type of insulin schedule, you should eat meals on a regular schedule.  If a meal is missed or significantly.  delayed, your blood sugar could go low. In view of your medical condition, you should avoid pregnancy until we have decided it is safe.   If it is negative, please come back for a follow-up appointment in 2 weeks.

## 2017-10-17 ENCOUNTER — Other Ambulatory Visit: Payer: Self-pay

## 2017-10-17 ENCOUNTER — Telehealth: Payer: Self-pay | Admitting: Endocrinology

## 2017-10-17 MED ORDER — "INSULIN SYRINGE-NEEDLE U-100 30G X 5/16"" 0.3 ML MISC": 11 refills | Status: DC

## 2017-10-17 MED ORDER — INSULIN GLARGINE 100 UNIT/ML ~~LOC~~ SOLN: SUBCUTANEOUS | 11 refills | Status: DC

## 2017-10-17 MED ORDER — ACCU-CHEK SOFTCLIX LANCETS MISC: 12 refills | Status: DC

## 2017-10-17 NOTE — Telephone Encounter (Signed)
Patient stated the wrong prescription was sent to her pharmacy for her lancets. Please advise

## 2017-10-17 NOTE — Telephone Encounter (Signed)
I have sent accu-chek soft clix lancets at patient's request.

## 2017-11-28 ENCOUNTER — Telehealth: Payer: Self-pay | Admitting: Endocrinology

## 2017-11-28 ENCOUNTER — Ambulatory Visit: Payer: Medicaid Other | Admitting: Endocrinology

## 2017-11-28 ENCOUNTER — Other Ambulatory Visit: Payer: Self-pay

## 2017-11-28 MED ORDER — INSULIN GLARGINE 100 UNIT/ML ~~LOC~~ SOLN: SUBCUTANEOUS | 11 refills | Status: DC

## 2017-11-28 NOTE — Telephone Encounter (Signed)
insulin glargine (LANTUS) 100 UNIT/ML injection   Patient is needing her insulin sent into the pharmacy    [place], [place].

## 2017-11-28 NOTE — Telephone Encounter (Signed)
I have sent to patient;'s pharmacy.  

## 2017-12-21 ENCOUNTER — Ambulatory Visit: Payer: Medicaid Other | Admitting: Endocrinology

## 2017-12-21 DIAGNOSIS — Z0289 Encounter for other administrative examinations: Secondary | ICD-10-CM

## 2018-02-09 ENCOUNTER — Ambulatory Visit: Payer: Medicaid Other | Admitting: Endocrinology

## 2018-02-16 ENCOUNTER — Ambulatory Visit: Payer: Medicaid Other | Admitting: Endocrinology

## 2018-02-16 DIAGNOSIS — Z0289 Encounter for other administrative examinations: Secondary | ICD-10-CM

## 2018-03-03 ENCOUNTER — Telehealth: Payer: Self-pay

## 2018-03-03 MED ORDER — FLUCONAZOLE 150 MG PO TABS: 150.0000 mg | ORAL_TABLET | Freq: Once | ORAL | 1 refills | Status: AC

## 2018-03-03 MED ORDER — INSULIN GLARGINE 100 UNIT/ML ~~LOC~~ SOLN: SUBCUTANEOUS | 11 refills | Status: DC

## 2018-03-03 NOTE — Telephone Encounter (Signed)
Patient requesting something to be called in for a yeast infection she would also like a call back from MD to let her know when this has been done

## 2018-03-03 NOTE — Telephone Encounter (Signed)
Please advise 

## 2018-03-03 NOTE — Telephone Encounter (Signed)
Okay to call in Diflucan 150 mg #1 with 1 refill.

## 2018-03-03 NOTE — Telephone Encounter (Signed)
RX sent

## 2018-03-03 NOTE — Telephone Encounter (Signed)
Patient notified

## 2018-03-06 ENCOUNTER — Ambulatory Visit: Payer: Medicaid Other | Admitting: Endocrinology

## 2018-03-06 DIAGNOSIS — Z0289 Encounter for other administrative examinations: Secondary | ICD-10-CM

## 2018-04-05 ENCOUNTER — Encounter: Payer: Self-pay | Admitting: Endocrinology

## 2018-04-05 ENCOUNTER — Ambulatory Visit (INDEPENDENT_AMBULATORY_CARE_PROVIDER_SITE_OTHER): Payer: Medicaid Other | Admitting: Endocrinology

## 2018-04-05 VITALS — BP 120/80 | HR 95 | Ht 64.0 in | Wt 114.4 lb

## 2018-04-05 DIAGNOSIS — M79671 Pain in right foot: Secondary | ICD-10-CM | POA: Insufficient documentation

## 2018-04-05 DIAGNOSIS — O24019 Pre-existing diabetes mellitus, type 1, in pregnancy, unspecified trimester: Secondary | ICD-10-CM | POA: Diagnosis not present

## 2018-04-05 LAB — POCT GLYCOSYLATED HEMOGLOBIN (HGB A1C): HEMOGLOBIN A1C: 14.7 % — AB (ref 4.0–5.6)

## 2018-04-05 MED ORDER — GLUCOSE BLOOD VI STRP: 1.0000 | ORAL_STRIP | Freq: Two times a day (BID) | 12 refills | Status: DC

## 2018-04-05 MED ORDER — FLUCONAZOLE 150 MG PO TABS: 150.0000 mg | ORAL_TABLET | Freq: Once | ORAL | 0 refills | Status: AC

## 2018-04-05 NOTE — Patient Instructions (Addendum)
Please see a foot specialist.  you will receive a phone call, about a day and time for an appointment It is really important to never miss the insulin.  To help you remember it, put it next to something you use each morning, such as your toothbrush.  It does not need to be refrigerated.   I have sent a prescription to your pharmacy, for the fluconazole.   check your blood sugar twice a day.  vary the time of day when you check, between before the 3 meals, and at bedtime.  also check if you have symptoms of your blood sugar being too high or too low.  please keep a record of the readings and bring it to your next appointment here (or you can bring the meter itself).  You can write it on any piece of paper.  please call us sooner if your blood sugar goes below 70, or if you have a lot of readings over 200.   Please come back for a follow-up appointment in 3 months.

## 2018-04-05 NOTE — Progress Notes (Signed)
Subjective:    Patient ID: Ann Robertson, female    DOB: [birth year], 27 y.o.   MRN: [id number]  HPI Pt returns for f/u of diabetes mellitus: DM type: 1 Dx'ed: 1287 Complications: polyneuropathy and nephropathy. Therapy: insulin since dx DKA: last episode was in 2008.  Severe hypoglycemia: last episode was approx 2012.   Pancreatitis: never.   Other: she took pump rx only during the 2014-2015 pregnancy, and V-GO device during 2016 pregnancy; she takes qd insulin, after poor results with multiple daily injections; she has had TL.   Interval history: pt walks in today, after multiple no-shows and cancellations.  She does not check cbg's.  She still takes insulin intermittently.  Main symptom is pain at the right foot.   Past Medical History:  Diagnosis Date  . Diabetes mellitus   . High cholesterol   . HSV-2 infection   . Juvenile diabetes 2000  . Juvenile diabetes mellitus   . Neuropathy   . Preeclampsia   . STD (sexually transmitted disease)     Past Surgical History:  Procedure Laterality Date  . CESAREAN SECTION    . CESAREAN SECTION N/A 07/05/2013   Procedure: CESAREAN SECTION;  Surgeon: Emily Filbert, MD;  Location: [facility];  Service: Obstetrics;  Laterality: N/A;  . CESAREAN SECTION N/A 07/25/2014   Procedure: CESAREAN SECTION;  Surgeon: Donnamae Jude, MD;  Location: [facility];  Service: Obstetrics;  Laterality: N/A;  . CESAREAN SECTION N/A 11/21/2015   Procedure: CESAREAN SECTION;  Surgeon: Donnamae Jude, MD;  Location: [facility];  Service: Obstetrics;  Laterality: N/A;  . WISDOM TOOTH EXTRACTION      Social History   Socioeconomic History  . Marital status: Single    Spouse name: Not on file  . Number of children: Not on file  . Years of education: Not on file  . Highest education level: Not on file  Occupational History  . Not on file  Social Needs  . Financial resource strain: Not on file  . Food insecurity:    Worry: Not on file    Inability: Not on  file  . Transportation needs:    Medical: Not on file    Non-medical: Not on file  Tobacco Use  . Smoking status: Current Every Day Smoker    Packs/day: 0.25    Years: 9.00    Pack years: 2.25    Types: Cigarettes  . Smokeless tobacco: Never Used  Substance and Sexual Activity  . Alcohol use: No  . Drug use: No  . Sexual activity: Not Currently    Birth control/protection: None  Lifestyle  . Physical activity:    Days per week: Not on file    Minutes per session: Not on file  . Stress: Not on file  Relationships  . Social connections:    Talks on phone: Not on file    Gets together: Not on file    Attends religious service: Not on file    Active member of club or organization: Not on file    Attends meetings of clubs or organizations: Not on file    Relationship status: Not on file  . Intimate partner violence:    Fear of current or ex partner: Not on file    Emotionally abused: Not on file    Physically abused: Not on file    Forced sexual activity: Not on file  Other Topics Concern  . Not on file  Social History Narrative  .  Not on file    Current Outpatient Medications on File Prior to Visit  Medication Sig Dispense Refill  . ACCU-CHEK SOFTCLIX LANCETS lancets Used to check blood sugars daily. 100 each 12  . insulin glargine (LANTUS) 100 UNIT/ML injection INJECT 18 UNITS INTO THE SKIN DAILY WITH BREAKFAST. 1 vial 11  . Insulin Syringe-Needle U-100 (RELION INSULIN SYR 0.3CC/30G) 30G X 5/16" 0.3 ML MISC USED TO INJECT INSULIN ONCE DAILY. 100 each 11   No current facility-administered medications on file prior to visit.     Allergies  Allergen Reactions  . Hydromorphone Hcl Itching and Rash    Family History  Problem Relation Age of Onset  . Stroke Mother   . Hypertension Mother   . Heart disease Mother   . Kidney disease Mother   . Hyperlipidemia Father   . Diabetes Father     BP 120/80 (BP Location: Left Arm, Patient Position: Sitting, Cuff Size:  Normal)   Pulse 95   Ht 5\' 4"  (1.626 m)   Wt 114 lb 6.4 oz (51.9 kg)   SpO2 95%   BMI 19.64 kg/m    Review of Systems She has lost a few more lbs.  She has vaginal itching    Objective:   Physical Exam VITAL SIGNS:  See vs page GENERAL: no distress Pulses: dorsalis pedis intact bilat.   MSK: no deformity of the feet CV: no leg edema Skin:  no ulcer on the feet.  normal color and temp on the feet. Neuro: sensation is intact to touch on the feet   Lab Results  Component Value Date   HGBA1C 14.7 (A) 04/05/2018       Assessment & Plan:  Type 1 DM, with nephropathy: very poor control Noncompliance: this limits rx of DM.  Vaginitis, due to severe hyperglycemia.  Foot pain, new, uncertain etiology  Patient Instructions  Please see a foot specialist.  you will receive a phone call, about a day and time for an appointment It is really important to never miss the insulin.  To help you remember it, put it next to something you use each morning, such as your toothbrush.  It does not need to be refrigerated.   I have sent a prescription to your pharmacy, for the fluconazole.   check your blood sugar twice a day.  vary the time of day when you check, between before the 3 meals, and at bedtime.  also check if you have symptoms of your blood sugar being too high or too low.  please keep a record of the readings and bring it to your next appointment here (or you can bring the meter itself).  You can write it on any piece of paper.  please call us sooner if your blood sugar goes below 70, or if you have a lot of readings over 200.   Please come back for a follow-up appointment in 3 months.

## 2018-04-06 ENCOUNTER — Ambulatory Visit: Payer: Medicaid Other | Admitting: Endocrinology

## 2018-04-19 ENCOUNTER — Ambulatory Visit: Payer: Medicaid Other | Admitting: Podiatry

## 2018-12-05 ENCOUNTER — Encounter: Payer: Self-pay | Admitting: Endocrinology

## 2018-12-05 ENCOUNTER — Ambulatory Visit (INDEPENDENT_AMBULATORY_CARE_PROVIDER_SITE_OTHER): Payer: Medicaid Other | Admitting: Endocrinology

## 2018-12-05 ENCOUNTER — Other Ambulatory Visit: Payer: Self-pay

## 2018-12-05 DIAGNOSIS — E1040 Type 1 diabetes mellitus with diabetic neuropathy, unspecified: Secondary | ICD-10-CM | POA: Diagnosis not present

## 2018-12-05 MED ORDER — ACCU-CHEK AVIVA PLUS W/DEVICE KIT: 1.0000 | PACK | Freq: Once | 0 refills | Status: AC

## 2018-12-05 MED ORDER — FLUCONAZOLE 100 MG PO TABS: 100.0000 mg | ORAL_TABLET | Freq: Every day | ORAL | 2 refills | Status: DC

## 2018-12-05 MED ORDER — INSULIN GLARGINE 100 UNIT/ML ~~LOC~~ SOLN: 18.0000 [IU] | SUBCUTANEOUS | 3 refills | Status: DC

## 2018-12-05 MED ORDER — ACCU-CHEK AVIVA VI STRP: 1.0000 | ORAL_STRIP | Freq: Four times a day (QID) | 3 refills | Status: DC

## 2018-12-05 NOTE — Progress Notes (Signed)
Subjective:    Patient ID: Ann Robertson, female    DOB: Oct 05, 1990, 28 y.o.   MRN: [id number]  HPI  telehealth visit today via doxy video visit.  Alternatives to telehealth are presented to this patient, and the patient agrees to the telehealth visit. Pt is advised of the cost of the visit, and agrees to this, also.   Patient is at home, and I am at the office.   Persons attending the telehealth visit: the patient and I Pt returns for f/u of diabetes mellitus: DM type: 1 Dx'ed: 6967 Complications: polyneuropathy and nephropathy.   Therapy: insulin since dx DKA: last episode was in 2008.  Severe hypoglycemia: last episode was approx 2012.   Pancreatitis: never.   Other: she took pump rx only during the 2014-2015 pregnancy, and V-GO device during 2016 pregnancy; she takes qd insulin, after poor results with multiple daily injections; she has had TL;  Pt has h/o multiple no-shows and cancellations; she prefers insulin from vials.   Interval history: She does not check cbg's.  She again says she takes insulin intermittently.  She has intermittent tremor of the hands, and assoc nausea.  Eating or drinking helps.   Past Medical History:  Diagnosis Date  . Diabetes mellitus   . High cholesterol   . HSV-2 infection   . Juvenile diabetes 2000  . Juvenile diabetes mellitus   . Neuropathy   . Preeclampsia   . STD (sexually transmitted disease)     Past Surgical History:  Procedure Laterality Date  . CESAREAN SECTION    . CESAREAN SECTION N/A 07/05/2013   Procedure: CESAREAN SECTION;  Surgeon: Emily Filbert, MD;  Location: [facility];  Service: Obstetrics;  Laterality: N/A;  . CESAREAN SECTION N/A 07/25/2014   Procedure: CESAREAN SECTION;  Surgeon: Donnamae Jude, MD;  Location: [facility];  Service: Obstetrics;  Laterality: N/A;  . CESAREAN SECTION N/A 11/21/2015   Procedure: CESAREAN SECTION;  Surgeon: Donnamae Jude, MD;  Location: [facility];  Service: Obstetrics;  Laterality: N/A;  .  WISDOM TOOTH EXTRACTION      Social History   Socioeconomic History  . Marital status: Single    Spouse name: Not on file  . Number of children: Not on file  . Years of education: Not on file  . Highest education level: Not on file  Occupational History  . Not on file  Social Needs  . Financial resource strain: Not on file  . Food insecurity    Worry: Not on file    Inability: Not on file  . Transportation needs    Medical: Not on file    Non-medical: Not on file  Tobacco Use  . Smoking status: Current Every Day Smoker    Packs/day: 0.25    Years: 9.00    Pack years: 2.25    Types: Cigarettes  . Smokeless tobacco: Never Used  Substance and Sexual Activity  . Alcohol use: No  . Drug use: No  . Sexual activity: Not Currently    Birth control/protection: None  Lifestyle  . Physical activity    Days per week: Not on file    Minutes per session: Not on file  . Stress: Not on file  Relationships  . Social Herbalist on phone: Not on file    Gets together: Not on file    Attends religious service: Not on file    Active member of club or organization: Not on file  Attends meetings of clubs or organizations: Not on file    Relationship status: Not on file  . Intimate partner violence    Fear of current or ex partner: Not on file    Emotionally abused: Not on file    Physically abused: Not on file    Forced sexual activity: Not on file  Other Topics Concern  . Not on file  Social History Narrative  . Not on file    Current Outpatient Medications on File Prior to Visit  Medication Sig Dispense Refill  . Insulin Syringe-Needle U-100 (RELION INSULIN SYR 0.3CC/30G) 30G X 5/16" 0.3 ML MISC USED TO INJECT INSULIN ONCE DAILY. 100 each 11   No current facility-administered medications on file prior to visit.     Allergies  Allergen Reactions  . Hydromorphone Hcl Itching and Rash    Family History  Problem Relation Age of Onset  . Stroke Mother   .  Hypertension Mother   . Heart disease Mother   . Kidney disease Mother   . Hyperlipidemia Father   . Diabetes Father     There were no vitals taken for this visit.  Review of Systems Denies LOC and weight change.  She has vaginal d/c.     Objective:   Physical Exam       Assessment & Plan:  Type 1 DM, with PN: uncertain glycemic control Noncompliance with cbg recording: this complicates the rx of DM Tremor: poss due to hypoglycemia Vag d/c: prob monilial   Patient Instructions  I have sent a prescription to your pharmacy, for insulin, fluconazole, a new meter, and strips.   Please call your primary care provider if this does not help.  check your blood sugar 4 times a day.  vary the time of day when you check, between before the 3 meals, and at bedtime.  also check if you have symptoms of your blood sugar being too high or too low.  please keep a record of the readings and bring it to your next appointment here (or you can bring the meter itself).  You can write it on any piece of paper.  please call us sooner if your blood sugar goes below 70, or if you have a lot of readings over 200.   Please come back for a follow-up appointment in 2 months.

## 2018-12-05 NOTE — Patient Instructions (Addendum)
I have sent a prescription to your pharmacy, for insulin, fluconazole, a new meter, and strips.   Please call your primary care provider if this does not help.  check your blood sugar 4 times a day.  vary the time of day when you check, between before the 3 meals, and at bedtime.  also check if you have symptoms of your blood sugar being too high or too low.  please keep a record of the readings and bring it to your next appointment here (or you can bring the meter itself).  You can write it on any piece of paper.  please call us sooner if your blood sugar goes below 70, or if you have a lot of readings over 200.   Please come back for a follow-up appointment in 2 months.

## 2019-02-18 ENCOUNTER — Emergency Department (HOSPITAL_BASED_OUTPATIENT_CLINIC_OR_DEPARTMENT_OTHER)
Admission: EM | Admit: 2019-02-18 | Discharge: 2019-02-18 | Disposition: A | Discharge status: Home / Self Care | Payer: Medicaid Other | Attending: Emergency Medicine | Admitting: Emergency Medicine

## 2019-02-18 ENCOUNTER — Other Ambulatory Visit: Payer: Self-pay

## 2019-02-18 ENCOUNTER — Encounter (HOSPITAL_BASED_OUTPATIENT_CLINIC_OR_DEPARTMENT_OTHER): Payer: Self-pay | Admitting: *Deleted

## 2019-02-18 DIAGNOSIS — F17210 Nicotine dependence, cigarettes, uncomplicated: Secondary | ICD-10-CM | POA: Diagnosis not present

## 2019-02-18 DIAGNOSIS — Z79899 Other long term (current) drug therapy: Secondary | ICD-10-CM | POA: Insufficient documentation

## 2019-02-18 DIAGNOSIS — E1065 Type 1 diabetes mellitus with hyperglycemia: Secondary | ICD-10-CM | POA: Insufficient documentation

## 2019-02-18 DIAGNOSIS — R112 Nausea with vomiting, unspecified: Secondary | ICD-10-CM | POA: Diagnosis present

## 2019-02-18 DIAGNOSIS — R739 Hyperglycemia, unspecified: Secondary | ICD-10-CM

## 2019-02-18 DIAGNOSIS — Z3202 Encounter for pregnancy test, result negative: Secondary | ICD-10-CM | POA: Diagnosis not present

## 2019-02-18 LAB — URINALYSIS, ROUTINE W REFLEX MICROSCOPIC
Bilirubin Urine: NEGATIVE
Glucose, UA: >=500 mg/dL — AB
Ketones, ur: 40 mg/dL — AB
Leukocytes,Ua: NEGATIVE
Nitrite: NEGATIVE
Protein, ur: NEGATIVE mg/dL
Specific Gravity, Urine: 1.015 (ref 1.005–1.030)
pH: 5.5 (ref 5.0–8.0)

## 2019-02-18 LAB — CBC WITH DIFFERENTIAL/PLATELET
Abs Immature Granulocytes: 0.01 10*3/uL (ref 0.00–0.07)
Basophils Absolute: 0 10*3/uL (ref 0.0–0.1)
Basophils Relative: 1 %
Eosinophils Absolute: 0 10*3/uL (ref 0.0–0.5)
Eosinophils Relative: 1 %
HCT: 35.6 % — ABNORMAL LOW (ref 36.0–46.0)
Hemoglobin: 11.7 g/dL — ABNORMAL LOW (ref 12.0–15.0)
Immature Granulocytes: 0 %
Lymphocytes Relative: 28 %
Lymphs Abs: 1 10*3/uL (ref 0.7–4.0)
MCH: 29 pg (ref 26.0–34.0)
MCHC: 32.9 g/dL (ref 30.0–36.0)
MCV: 88.1 fL (ref 80.0–100.0)
Monocytes Absolute: 0.4 10*3/uL (ref 0.1–1.0)
Monocytes Relative: 10 %
Neutro Abs: 2.1 10*3/uL (ref 1.7–7.7)
Neutrophils Relative %: 60 %
Platelets: 285 10*3/uL (ref 150–400)
RBC: 4.04 MIL/uL (ref 3.87–5.11)
RDW: 12.2 % (ref 11.5–15.5)
WBC: 3.5 10*3/uL — ABNORMAL LOW (ref 4.0–10.5)
nRBC: 0 % (ref 0.0–0.2)

## 2019-02-18 LAB — COMPREHENSIVE METABOLIC PANEL
ALT: 10 U/L (ref 0–44)
AST: 17 U/L (ref 15–41)
Albumin: 3.6 g/dL (ref 3.5–5.0)
Alkaline Phosphatase: 56 U/L (ref 38–126)
Anion gap: 14 (ref 5–15)
BUN: 23 mg/dL — ABNORMAL HIGH (ref 6–20)
CO2: 20 mmol/L — ABNORMAL LOW (ref 22–32)
Calcium: 8.4 mg/dL — ABNORMAL LOW (ref 8.9–10.3)
Chloride: 93 mmol/L — ABNORMAL LOW (ref 98–111)
Creatinine, Ser: 1.31 mg/dL — ABNORMAL HIGH (ref 0.44–1.00)
GFR calc Af Amer: >60 mL/min (ref >60)
GFR calc non Af Amer: 54 mL/min — ABNORMAL LOW (ref >60)
Glucose, Bld: 512 mg/dL (ref 70–99)
Potassium: 4.4 mmol/L (ref 3.5–5.1)
Sodium: 127 mmol/L — ABNORMAL LOW (ref 135–145)
Total Bilirubin: 1.2 mg/dL (ref 0.3–1.2)
Total Protein: 6.8 g/dL (ref 6.5–8.1)

## 2019-02-18 LAB — URINALYSIS, MICROSCOPIC (REFLEX)

## 2019-02-18 LAB — CBG MONITORING, ED
Glucose-Capillary: 445 mg/dL — ABNORMAL HIGH (ref 70–99)
Glucose-Capillary: 340 mg/dL — ABNORMAL HIGH (ref 70–99)

## 2019-02-18 LAB — PREGNANCY, URINE: Preg Test, Ur: NEGATIVE

## 2019-02-18 LAB — LIPASE, BLOOD: Lipase: 19 U/L (ref 11–51)

## 2019-02-18 MED ORDER — SODIUM CHLORIDE 0.9 % IV BOLUS
1000.0000 mL | Freq: Once | INTRAVENOUS | Status: AC
  Administered 2019-02-18: 19:00:00 1000 mL via INTRAVENOUS

## 2019-02-18 MED ORDER — ONDANSETRON HCL 4 MG/2ML IJ SOLN
4.0000 mg | Freq: Once | INTRAMUSCULAR | Status: AC
  Administered 2019-02-18: 4 mg via INTRAVENOUS
  Filled 2019-02-18: qty 2

## 2019-02-18 MED ORDER — INSULIN REGULAR HUMAN 100 UNIT/ML IJ SOLN
10.0000 [IU] | Freq: Once | INTRAMUSCULAR | Status: AC
  Administered 2019-02-18: 10 [IU] via SUBCUTANEOUS
  Filled 2019-02-18: qty 1

## 2019-02-18 MED ORDER — ONDANSETRON 4 MG PO TBDP: 4.0000 mg | ORAL_TABLET | Freq: Three times a day (TID) | ORAL | 0 refills | Status: DC | PRN

## 2019-02-18 MED ORDER — SODIUM CHLORIDE 0.9 % IV BOLUS
1000.0000 mL | Freq: Once | INTRAVENOUS | Status: AC
  Administered 2019-02-18: 1000 mL via INTRAVENOUS

## 2019-02-18 NOTE — ED Notes (Signed)
Glucose 512, results given to ED PA and Orlando Penner RN

## 2019-02-18 NOTE — Discharge Instructions (Signed)
Please read and follow all provided instructions.  Your diagnoses today include:  1. Hyperglycemia   2. Non-intractable vomiting with nausea, unspecified vomiting type     Tests performed today include:  Blood counts and electrolytes  Kidney function -show some signs of dehydration, your creatinine is slightly high today at 1.31  Urine test -no obvious infection  Vital signs. See below for your results today.   Medications prescribed:   Zofran (ondansetron) - for nausea and vomiting  Take any prescribed medications only as directed.  Home care instructions:  Follow any educational materials contained in this packet.  BE VERY CAREFUL not to take multiple medicines containing Tylenol (also called acetaminophen). Doing so can lead to an overdose which can damage your liver and cause liver failure and possibly death.   Follow-up instructions: Please follow-up with your primary care provider in the next 1-2 days for further evaluation of your symptoms.   Return instructions:   Please return to the Emergency Department if you experience worsening symptoms.   Return with persistent vomiting, fever, new symptoms or other concerns.  Please return if you have any other emergent concerns.  Additional Information:  Your vital signs today were: BP (!) 130/99 (BP Location: Right Arm)    Pulse 97    Temp 98.8 F (37.1 C) (Oral)    Resp 20    Ht 5\' 5"  (1.651 m)    Wt 48.8 kg    LMP 01/25/2019    SpO2 100%    Breastfeeding No    BMI 17.89 kg/m  If your blood pressure (BP) was elevated above 135/85 this visit, please have this repeated by your doctor within one month. --------------

## 2019-02-18 NOTE — ED Provider Notes (Signed)
[facility] DEPARTMENT Provider Note   CSN: [id number] Arrival date & time: 02/18/19  1700     History   Chief Complaint Chief Complaint  Patient presents with  . Nausea    HPI Ann Robertson is a 28 y.o. female.     Patient with history of type 1 diabetes presents the emergency department with complaint of nausea and "spitting up episodes".  Patient states that she has had very intense nausea ongoing over the past 2 to 3 days.  She has not been vomiting.  No abdominal pains.  No fevers, chest pain, shortness of breath or cough.  Patient denies any urinary symptoms.  She has been taking her insulin.  She has been eating some bland foods but states that this causes intense nausea.  She continues to have an appetite.  No treatments prior to arrival.  Onset of symptoms acute progressive constant.  Patient denies heavy NSAID or alcohol use.  No known sick contacts.  No diarrhea.     Past Medical History:  Diagnosis Date  . Diabetes mellitus   . High cholesterol   . HSV-2 infection   . Juvenile diabetes 2000  . Juvenile diabetes mellitus   . Neuropathy   . Preeclampsia   . STD (sexually transmitted disease)     Patient Active Problem List   Diagnosis Date Noted  . Foot pain, right 04/05/2018  . Postpartum care following cesarean delivery 11/21/2015  . Preterm labor 11/16/2015  . Noncompliance with diabetes treatment 11/15/2015  . Traumatic injury during pregnancy in third trimester 11/14/2015  . Maternal chronic hypertension 11/06/2015  . Previous cesarean section complicating pregnancy [id number]  . Short interval between pregnancies complicating pregnancy, antepartum 07/14/2015  . History of preterm delivery, currently pregnant 07/14/2015  . History of pre-eclampsia in prior pregnancy, currently pregnant 07/14/2015  . Supervision of high risk pregnancy, antepartum 07/14/2015  . History of preterm delivery 09/06/2014  . History of polyhydramnios  09/06/2014  . 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[place]) 12/06/2013    Past Surgical History:  Procedure Laterality Date  . CESAREAN SECTION    . CESAREAN SECTION N/A 07/05/2013   Procedure: CESAREAN SECTION;  Surgeon: Emily Filbert, MD;  Location: [facility];  Service: Obstetrics;  Laterality: N/A;  . CESAREAN SECTION N/A 07/25/2014   Procedure: CESAREAN SECTION;  Surgeon: Donnamae Jude, MD;  Location: [facility];  Service: Obstetrics;  Laterality: N/A;  . CESAREAN SECTION N/A 11/21/2015   Procedure: CESAREAN SECTION;  Surgeon: Donnamae Jude, MD;  Location: [facility];  Service: Obstetrics;  Laterality: N/A;  . WISDOM TOOTH EXTRACTION       OB History    Gravida  5   Para  4   Term  0   Preterm  4   AB  1   Living  4     SAB  1   TAB  0   Ectopic  0   Multiple  0   Live Births  4            Home Medications    Prior to Admission medications   Medication Sig Start Date End Date Taking? Authorizing Provider  fluconazole (DIFLUCAN) 100 MG tablet Take 1 tablet (100 mg total) by mouth daily. 12/05/18   Renato Shin, MD  glucose blood (ACCU-CHEK AVIVA) test strip 1 each by Other route 4 (four) times daily. And lancets  2/day 12/05/18   Renato Shin, MD  insulin glargine (LANTUS) 100 UNIT/ML injection Inject 0.18 mLs (18 Units total) into the skin every morning. And syringes 1/day 12/05/18   Renato Shin, MD  Insulin Syringe-Needle U-100 (RELION INSULIN SYR 0.3CC/30G) 30G X 5/16" 0.3 ML MISC USED TO INJECT INSULIN ONCE DAILY. 10/17/17   Renato Shin, MD    Family History Family History  Problem Relation Age of Onset  . Stroke Mother   . Hypertension Mother   . Heart disease Mother   . Kidney disease Mother   . Hyperlipidemia Father   . Diabetes Father     Social History Social History   Tobacco Use  . Smoking status: Current Every Day Smoker    Packs/day: 0.25     Years: 9.00    Pack years: 2.25    Types: Cigarettes  . Smokeless tobacco: Never Used  Substance Use Topics  . Alcohol use: Yes    Comment: occasional  . Drug use: No     Allergies   Hydromorphone hcl   Review of Systems Review of Systems  Constitutional: Negative for fever.  HENT: Negative for rhinorrhea and sore throat.   Eyes: Negative for redness.  Respiratory: Negative for cough.   Cardiovascular: Negative for chest pain.  Gastrointestinal: Positive for nausea. Negative for abdominal pain, diarrhea and vomiting.  Endocrine: Negative for polydipsia and polyuria.  Genitourinary: Negative for dysuria and hematuria.  Musculoskeletal: Negative for myalgias.  Skin: Negative for rash.  Neurological: Negative for headaches.     Physical Exam Updated Vital Signs BP (!) 121/91 (BP Location: Left Arm)   Pulse (!) 116   Temp 98.8 F (37.1 C) (Oral)   Resp 16   Ht 5\' 5"  (1.651 m)   Wt 48.8 kg   LMP 01/25/2019   SpO2 100%   Breastfeeding No   BMI 17.89 kg/m   Physical Exam Vitals signs and nursing note reviewed.  Constitutional:      Appearance: She is well-developed.  HENT:     Head: Normocephalic and atraumatic.  Eyes:     General:        Right eye: No discharge.        Left eye: No discharge.     Conjunctiva/sclera: Conjunctivae normal.  Neck:     Musculoskeletal: Normal range of motion and neck supple.  Cardiovascular:     Rate and Rhythm: Regular rhythm. Tachycardia present.     Heart sounds: Normal heart sounds.  Pulmonary:     Effort: Pulmonary effort is normal.     Breath sounds: Normal breath sounds.  Abdominal:     Palpations: Abdomen is soft.     Tenderness: There is no abdominal tenderness. There is no guarding or rebound.  Skin:    General: Skin is warm and dry.  Neurological:     Mental Status: She is alert.      ED Treatments / Results  Labs (all labs ordered are listed, but only abnormal results are displayed) Labs Reviewed  CBC  WITH DIFFERENTIAL/PLATELET - Abnormal; Notable for the following components:      Result Value   WBC 3.5 (*)    Hemoglobin 11.7 (*)    HCT 35.6 (*)    All other components within normal limits  COMPREHENSIVE METABOLIC PANEL - Abnormal; Notable for the following components:   Sodium 127 (*)    Chloride 93 (*)    CO2 20 (*)    Glucose, Bld 512 (*)  BUN 23 (*)    Creatinine, Ser 1.31 (*)    Calcium 8.4 (*)    GFR calc non Af Amer 54 (*)    All other components within normal limits  URINALYSIS, ROUTINE W REFLEX MICROSCOPIC - Abnormal; Notable for the following components:   Glucose, UA >=500 (*)    Hgb urine dipstick TRACE (*)    Ketones, ur 40 (*)    All other components within normal limits  URINALYSIS, MICROSCOPIC (REFLEX) - Abnormal; Notable for the following components:   Bacteria, UA FEW (*)    All other components within normal limits  CBG MONITORING, ED - Abnormal; Notable for the following components:   Glucose-Capillary 445 (*)    All other components within normal limits  CBG MONITORING, ED - Abnormal; Notable for the following components:   Glucose-Capillary 340 (*)    All other components within normal limits  LIPASE, BLOOD  PREGNANCY, URINE    EKG None  Radiology No results found.  Procedures Procedures (including critical care time)  Medications Ordered in ED Medications  sodium chloride 0.9 % bolus 1,000 mL ( Intravenous Stopped 02/18/19 1907)  sodium chloride 0.9 % bolus 1,000 mL (0 mLs Intravenous Stopped 02/18/19 2027)  insulin regular (NOVOLIN R) 100 units/mL injection 10 Units (10 Units Subcutaneous Given 02/18/19 1923)  ondansetron (ZOFRAN) injection 4 mg (4 mg Intravenous Given 02/18/19 2122)     Initial Impression / Assessment and Plan / ED Course  I have reviewed the triage vital signs and the nursing notes.  Pertinent labs & imaging results that were available during my care of the patient were reviewed by me and considered in my medical  decision making (see chart for details).        Patient seen and examined.  Patient is well-appearing.  Will check lab work, hydrate.  Patient in agreement with plan.  Initial blood sugar at arrival is in the 440s.  Vital signs reviewed and are as follows: BP (!) 121/91 (BP Location: Left Arm)   Pulse (!) 116   Temp 98.8 F (37.1 C) (Oral)   Resp 16   Ht 5\' 5"  (1.651 m)   Wt 48.8 kg   LMP 01/25/2019   SpO2 100%   Breastfeeding No   BMI 17.89 kg/m   7:05 PM on BMP, glucose 512.  We will continue hydration and give 10 units of subcutaneous insulin.  9:27 PM patient reassessed.  Blood sugar into the 300s after second liter of fluids and 10 units of subcutaneous insulin.  Patient states that she is feeling better.  We discussed her lab results tonight.  Overall labs are suggestive of an element of dehydration.  We discussed that her creatinine is elevated from her baseline back in 2017.  I do not know what her recent baseline is.  Encouraged her to have this rechecked by her doctor.  We will discharged home at this time with Zofran to control nausea.  Patient encouraged to continue good hydration at home and to follow-up with her doctor in the next 1 to 2 days for recheck of her symptoms and discussion of her insulin regimen.  The patient was urged to return to the Emergency Department immediately with worsening of current symptoms, worsening abdominal pain, persistent vomiting, blood noted in stools, fever, or any other concerns. The patient verbalized understanding.    Final Clinical Impressions(s) / ED Diagnoses   Final diagnoses:  Hyperglycemia  Non-intractable vomiting with nausea, unspecified vomiting type   Patient  with recent nausea and vomiting in setting of elevated blood sugars.  Patient is a type I diabetic.  Patient has elevated blood sugar tonight with a normal anion gap.  Her creatinine is slightly elevated from her baseline which suggests an element of dehydration.  Her  abdominal exam is reassuring.  She was treated with 2 L of IV normal saline in the emergency department with improvement.  She was also given 10 units of subcutaneous insulin.  Patient clinically improved with treatment here.  No indications for admission tonight.  Home with Zofran to control nausea.  Strongly encourage PCP follow-up in the next 1 to 2 days for recheck.  Return instructions as above.  Patient looks well at time of discharge.  ED Discharge Orders         Ordered    ondansetron (ZOFRAN ODT) 4 MG disintegrating tablet  Every 8 hours PRN     02/18/19 2108           Carlisle Cater, Hershal Coria 02/18/19 2131    Lajean Saver, MD 02/18/19 2322

## 2019-02-18 NOTE — ED Triage Notes (Signed)
Pt reports nausea since Friday. She has attempted to eat rice and noodles but still feels sick. Reports she hasn't been vomiting but has been "spitting". Denies pain

## 2019-02-19 MED FILL — Insulin Regular (Human) Inj 100 Unit/ML: INTRAMUSCULAR | Qty: 0.1 | Status: AC

## 2019-02-27 ENCOUNTER — Encounter: Payer: Self-pay | Admitting: Endocrinology

## 2019-02-28 ENCOUNTER — Other Ambulatory Visit: Payer: Self-pay

## 2019-02-28 ENCOUNTER — Ambulatory Visit (INDEPENDENT_AMBULATORY_CARE_PROVIDER_SITE_OTHER): Payer: Medicaid Other | Admitting: Endocrinology

## 2019-02-28 VITALS — Ht 65.0 in

## 2019-02-28 DIAGNOSIS — E1040 Type 1 diabetes mellitus with diabetic neuropathy, unspecified: Secondary | ICD-10-CM | POA: Diagnosis not present

## 2019-02-28 MED ORDER — INSULIN DETEMIR 100 UNIT/ML ~~LOC~~ SOLN: 20.0000 [IU] | SUBCUTANEOUS | 11 refills | Status: DC

## 2019-02-28 NOTE — Patient Instructions (Signed)
I have sent a prescription to your pharmacy, to change the Lantus to Levemir, 20 units each morning.  This is not a unit-for-unit change, so you need more of the Levemir. check your blood sugar 4 times a day: before the 3 meals, and at bedtime.  also check if you have symptoms of your blood sugar being too high or too low.  please keep a record of the readings and bring it to your next appointment here (or you can bring the meter itself).  You can write it on any piece of paper.  please call us sooner if your blood sugar goes below 70, or if you have a lot of readings over 200.   Please come back for a follow-up appointment in 1 month.

## 2019-02-28 NOTE — Progress Notes (Signed)
Subjective:    Patient ID: Ann Robertson, female    DOB: Jun 10, 1990, 28 y.o.   MRN: [id number]  HPI telehealth visit today via doxy video visit.  Alternatives to telehealth are presented to this patient, and the patient agrees to the telehealth visit. Pt is advised of the cost of the visit, and agrees to this, also.   Patient is at home, and I am at the office.   Persons attending the telehealth visit: the patient and I Pt returns for f/u of diabetes mellitus: DM type: 1 Dx'ed: [id number] Complications: polyneuropathy and nephropathy.   Therapy: insulin since dx DKA: last episode was in 2008.  Severe hypoglycemia: last episode was approx 2012.   Pancreatitis: never.   Other: she took pump rx only during the 2014-2015 pregnancy, and V-GO device during 2016 pregnancy; she takes qd insulin, after poor results with multiple daily injections; she has had TL;  Pt has h/o multiple no-shows and cancellations; she prefers insulin from vials.   Interval history: She does not check cbg's.  She still she takes insulin intermittently.  She was seen in ER, with severe hyperglycemia and mild mild DKA.  She feels better now.  She takes 12-16 units qam.  She had to reduce, due to nocturnal hypoglycemia.  She says cbg varies from [phone].  It is in general higher as the day goes on.  It is low fasting, despite eating a snack at HS.   Past Medical History:  Diagnosis Date   Diabetes mellitus    High cholesterol    HSV-2 infection    Juvenile diabetes 2000   Juvenile diabetes mellitus    Neuropathy    Preeclampsia    STD (sexually transmitted disease)     Past Surgical History:  Procedure Laterality Date   CESAREAN SECTION     CESAREAN SECTION N/A 07/05/2013   Procedure: CESAREAN SECTION;  Surgeon: Emily Filbert, MD;  Location: [facility];  Service: Obstetrics;  Laterality: N/A;   CESAREAN SECTION N/A 07/25/2014   Procedure: CESAREAN SECTION;  Surgeon: Donnamae Jude, MD;  Location: [facility];  Service:  Obstetrics;  Laterality: N/A;   CESAREAN SECTION N/A 11/21/2015   Procedure: CESAREAN SECTION;  Surgeon: Donnamae Jude, MD;  Location: [facility];  Service: Obstetrics;  Laterality: N/A;   WISDOM TOOTH EXTRACTION      Social History   Socioeconomic History   Marital status: Single    Spouse name: Not on file   Number of children: Not on file   Years of education: Not on file   Highest education level: Not on file  Occupational History   Not on file  Social Needs   Financial resource strain: Not on file   Food insecurity    Worry: Not on file    Inability: Not on file   Transportation needs    Medical: Not on file    Non-medical: Not on file  Tobacco Use   Smoking status: Current Every Day Smoker    Packs/day: 0.25    Years: 9.00    Pack years: 2.25    Types: Cigarettes   Smokeless tobacco: Never Used  Substance and Sexual Activity   Alcohol use: Yes    Comment: occasional   Drug use: No   Sexual activity: Not Currently    Birth control/protection: None  Lifestyle   Physical activity    Days per week: Not on file    Minutes per session: Not on file  Stress: Not on file  Relationships   Social connections    Talks on phone: Not on file    Gets together: Not on file    Attends religious service: Not on file    Active member of club or organization: Not on file    Attends meetings of clubs or organizations: Not on file    Relationship status: Not on file   Intimate partner violence    Fear of current or ex partner: Not on file    Emotionally abused: Not on file    Physically abused: Not on file    Forced sexual activity: Not on file  Other Topics Concern   Not on file  Social History Narrative   Not on file    Current Outpatient Medications on File Prior to Visit  Medication Sig Dispense Refill   fluconazole (DIFLUCAN) 100 MG tablet Take 1 tablet (100 mg total) by mouth daily. 3 tablet 2   glucose blood (ACCU-CHEK AVIVA) test  strip 1 each by Other route 4 (four) times daily. And lancets 2/day 360 each 3   Insulin Syringe-Needle U-100 (RELION INSULIN SYR 0.3CC/30G) 30G X 5/16" 0.3 ML MISC USED TO INJECT INSULIN ONCE DAILY. 100 each 11   ondansetron (ZOFRAN ODT) 4 MG disintegrating tablet Take 1 tablet (4 mg total) by mouth every 8 (eight) hours as needed for nausea or vomiting. 10 tablet 0   No current facility-administered medications on file prior to visit.     Allergies  Allergen Reactions   Hydromorphone Hcl Itching and Rash    Family History  Problem Relation Age of Onset   Stroke Mother    Hypertension Mother    Heart disease Mother    Kidney disease Mother    Hyperlipidemia Father    Diabetes Father     Ht 5\' 5"  (1.651 m)    BMI 17.89 kg/m    Review of Systems Denies LOC    Objective:   Physical Exam    Lab Results  Component Value Date   CREATININE 1.31 (H) 02/18/2019   BUN 23 (H) 02/18/2019   NA 127 (L) 02/18/2019   K 4.4 02/18/2019   CL 93 (L) 02/18/2019   CO2 20 (L) 02/18/2019      Assessment & Plan:  Type 1 DM: worse Noncompliance with cbg recording: I'll work around this as best I can Hypoglycemia: this limits aggressiveness of glycemic control   Patient Instructions  I have sent a prescription to your pharmacy, to change the Lantus to Levemir, 20 units each morning.  This is not a unit-for-unit change, so you need more of the Levemir. check your blood sugar 4 times a day: before the 3 meals, and at bedtime.  also check if you have symptoms of your blood sugar being too high or too low.  please keep a record of the readings and bring it to your next appointment here (or you can bring the meter itself).  You can write it on any piece of paper.  please call us sooner if your blood sugar goes below 70, or if you have a lot of readings over 200.   Please come back for a follow-up appointment in 1 month.

## 2019-04-23 ENCOUNTER — Telehealth: Payer: Self-pay

## 2019-04-23 NOTE — Telephone Encounter (Signed)
Please advise if pt's requested changes can be made without an appt

## 2019-04-23 NOTE — Telephone Encounter (Signed)
Please refer to Dr. Cordelia Pen response re: need for appt.

## 2019-04-23 NOTE — Telephone Encounter (Signed)
Please advise f/u ov--vv if pt prefers

## 2019-04-23 NOTE — Telephone Encounter (Signed)
LOV was in Sept and was advised to schedule f/u in 1 month. Pt will require an appt to discuss further. Please call pt to schedule appt.

## 2019-04-23 NOTE — Telephone Encounter (Signed)
patient will not make appt patient states Dr told her all she had to do was call and change her insulin she didn't need an appt to do that. She said she would make another follow up but she needs her insulin changed today      Please call patient and advise

## 2019-04-23 NOTE — Telephone Encounter (Signed)
Patient called in wanting to talk to Dr or Nurse about changing medication or adding fast acting insulin to her medications.     Please call and advise

## 2019-05-10 NOTE — Telephone Encounter (Signed)
Called pt and and attempted to schedule. Mailbox was full and unable to leave message.

## 2019-05-11 NOTE — Telephone Encounter (Signed)
Called pt and scheduled her for virtual visit next week. Pt was adamant about not coming into the office and was rather upset because Dr. Loanne Drilling refused to change her insulin without first seeing her. Pt did not want to cooperate and attempted to argue with each thing that was said.

## 2019-05-14 ENCOUNTER — Encounter: Payer: Self-pay | Admitting: Endocrinology

## 2019-05-14 ENCOUNTER — Other Ambulatory Visit: Payer: Self-pay

## 2019-05-14 ENCOUNTER — Telehealth: Payer: Self-pay

## 2019-05-14 ENCOUNTER — Ambulatory Visit (INDEPENDENT_AMBULATORY_CARE_PROVIDER_SITE_OTHER): Payer: Medicaid Other | Admitting: Endocrinology

## 2019-05-14 DIAGNOSIS — E1040 Type 1 diabetes mellitus with diabetic neuropathy, unspecified: Secondary | ICD-10-CM | POA: Diagnosis not present

## 2019-05-14 MED ORDER — ACCU-CHEK AVIVA PLUS VI STRP: 1.0000 | ORAL_STRIP | Freq: Four times a day (QID) | 3 refills | Status: DC

## 2019-05-14 MED ORDER — PEN NEEDLES 30G X 8 MM MISC: 1.0000 | Freq: Every day | 0 refills | Status: DC

## 2019-05-14 MED ORDER — NOVOLIN N FLEXPEN 100 UNIT/ML ~~LOC~~ SUPN: 18.0000 [IU] | PEN_INJECTOR | SUBCUTANEOUS | 11 refills | Status: DC

## 2019-05-14 NOTE — Telephone Encounter (Signed)
Patient called in stating that she does not have her pen needles for her vials and she also doesn't have 3 month supply for her test strips    MEDICATION:   glucose blood (ACCU-CHEK AVIVA PLUS) test strip   Insulin Syringe-Needle U-100 (RELION INSULIN SYR 0.3CC/30G) 30G X 5/16" 0.3 ML MISC   PHARMACY:  [facility], [place].  IS THIS A 90 DAY SUPPLY :   IS PATIENT OUT OF MEDICATION:   IF NOT; HOW MUCH IS LEFT:   LAST APPOINTMENT DATE: @12 /14/2020  NEXT APPOINTMENT DATE:@Visit  date not found  DO WE HAVE YOUR PERMISSION TO LEAVE A DETAILED MESSAGE:  OTHER COMMENTS: call patient please when sending so she will know to head to pharmacy    **Let patient know to contact pharmacy at the end of the day to make sure medication is ready. **  ** Please notify patient to allow 48-72 hours to process**  **Encourage patient to contact the pharmacy for refills or they can request refills through [facility]**

## 2019-05-14 NOTE — Progress Notes (Signed)
Subjective:    Patient ID: Ann Robertson, female    DOB: 05/10/1991, 28 y.o.   MRN: [id number]  HPI telehealth visit today via doxy video visit.  Alternatives to telehealth are presented to this patient, and the patient agrees to the telehealth visit. Pt is advised of the cost of the visit, and agrees to this, also.   Patient is at home, and I am at the office.   Persons attending the telehealth visit: the patient and I After talking by video x 10 minutes, we spoke by phone x 13 minutes.  Pt returns for f/u of diabetes mellitus: DM type: 1 Dx'ed: [id number] Complications: PN, renal insuff and DN.   Therapy: insulin since dx DKA: last episode was in 2008.  Severe hypoglycemia: last episode was approx 2012.   Pancreatitis: never.   Other: she took pump rx only during the 2014-2015 pregnancy, and V-GO device during 2016 pregnancy; she takes qd insulin, after poor results with multiple daily injections; she has had TL;  Pt has h/o multiple no-shows and cancellations; she prefers insulin from vials.   Interval history: She says cbg varies from 47-500.  It is in general higher as the day goes on.  She wants to take SS-insulin.   Past Medical History:  Diagnosis Date  . Diabetes mellitus   . High cholesterol   . HSV-2 infection   . Juvenile diabetes 2000  . Juvenile diabetes mellitus   . Neuropathy   . Preeclampsia   . STD (sexually transmitted disease)     Past Surgical History:  Procedure Laterality Date  . CESAREAN SECTION    . CESAREAN SECTION N/A 07/05/2013   Procedure: CESAREAN SECTION;  Surgeon: Emily Filbert, MD;  Location: [facility];  Service: Obstetrics;  Laterality: N/A;  . CESAREAN SECTION N/A 07/25/2014   Procedure: CESAREAN SECTION;  Surgeon: Donnamae Jude, MD;  Location: [facility];  Service: Obstetrics;  Laterality: N/A;  . CESAREAN SECTION N/A 11/21/2015   Procedure: CESAREAN SECTION;  Surgeon: Donnamae Jude, MD;  Location: [facility];  Service: Obstetrics;  Laterality:  N/A;  . WISDOM TOOTH EXTRACTION      Social History   Socioeconomic History  . Marital status: Single    Spouse name: Not on file  . Number of children: Not on file  . Years of education: Not on file  . Highest education level: Not on file  Occupational History  . Not on file  Tobacco Use  . Smoking status: Current Every Day Smoker    Packs/day: 0.25    Years: 9.00    Pack years: 2.25    Types: Cigarettes  . Smokeless tobacco: Never Used  Substance and Sexual Activity  . Alcohol use: Yes    Comment: occasional  . Drug use: No  . Sexual activity: Not Currently    Birth control/protection: None  Other Topics Concern  . Not on file  Social History Narrative  . Not on file   Social Determinants of Health   Financial Resource Strain:   . Difficulty of Paying Living Expenses: Not on file  Food Insecurity:   . Worried About Charity fundraiser in the Last Year: Not on file  . Ran Out of Food in the Last Year: Not on file  Transportation Needs:   . Lack of Transportation (Medical): Not on file  . Lack of Transportation (Non-Medical): Not on file  Physical Activity:   . Days of Exercise per Week: Not on  file  . Minutes of Exercise per Session: Not on file  Stress:   . Feeling of Stress : Not on file  Social Connections:   . Frequency of Communication with Friends and Family: Not on file  . Frequency of Social Gatherings with Friends and Family: Not on file  . Attends Religious Services: Not on file  . Active Member of Clubs or Organizations: Not on file  . Attends Archivist Meetings: Not on file  . Marital Status: Not on file  Intimate Partner Violence:   . Fear of Current or Ex-Partner: Not on file  . Emotionally Abused: Not on file  . Physically Abused: Not on file  . Sexually Abused: Not on file    Current Outpatient Medications on File Prior to Visit  Medication Sig Dispense Refill  . fluconazole (DIFLUCAN) 100 MG tablet Take 1 tablet (100 mg  total) by mouth daily. 3 tablet 2  . Insulin Syringe-Needle U-100 (RELION INSULIN SYR 0.3CC/30G) 30G X 5/16" 0.3 ML MISC USED TO INJECT INSULIN ONCE DAILY. 100 each 11  . ondansetron (ZOFRAN ODT) 4 MG disintegrating tablet Take 1 tablet (4 mg total) by mouth every 8 (eight) hours as needed for nausea or vomiting. 10 tablet 0   No current facility-administered medications on file prior to visit.    Allergies  Allergen Reactions  . Hydromorphone Hcl Itching and Rash    Family History  Problem Relation Age of Onset  . Stroke Mother   . Hypertension Mother   . Heart disease Mother   . Kidney disease Mother   . Hyperlipidemia Father   . Diabetes Father     There were no vitals taken for this visit.   Review of Systems Denies LOC    Objective:   Physical Exam   Lab Results  Component Value Date   CREATININE 1.31 (H) 02/18/2019   BUN 23 (H) 02/18/2019   NA 127 (L) 02/18/2019   K 4.4 02/18/2019   CL 93 (L) 02/18/2019   CO2 20 (L) 02/18/2019      Assessment & Plan:  Type 1 DM, with PN: she needs increased rx.  We discussed the need for anticipatory insulin dosing.  Hypoglycemia: this limits aggressiveness of glycemic control.  Renal insuff: in this setting, she needs a faster-acting qam insulin.  This is supported by pattern of cbg's.    Patient Instructions  I have sent a prescription to your pharmacy, to change the Levemir to NPH, 20 units each morning.    check your blood sugar 4 times a day: before the 3 meals, and at bedtime.  also check if you have symptoms of your blood sugar being too high or too low.  please keep a record of the readings and bring it to your next appointment here (or you can bring the meter itself).  You can write it on any piece of paper.  please call us sooner if your blood sugar goes below 70, or if you have a lot of readings over 200.   Please come back for a follow-up appointment in 1 month.

## 2019-05-14 NOTE — Telephone Encounter (Signed)
Dose: 1 each Route: Other Frequency: 4 times daily  Dispense Quantity: 400 each Refills: 3        Sig: 1 each by Other route 4 (four) times daily. And lancets 4/day       Start Date: 05/14/19 End Date: --  Written Date: 05/14/19 Expiration Date: 05/13/20  Providers  Authorizing Provider: Renato Shin, MD NPI: [id number]   DEA #: [id number]  Ordering User:  Renato Shin, [place], [place].  [address] [place] [address]  Phone:  [phone]  Fax:  [phone]  DEA #:  --   Above WAS submitted for a 90 day supply and was submitted today. Pt will need to discuss this issue with her pharmacy.

## 2019-05-14 NOTE — Patient Instructions (Addendum)
I have sent a prescription to your pharmacy, to change the Levemir to NPH, 20 units each morning.    check your blood sugar 4 times a day: before the 3 meals, and at bedtime.  also check if you have symptoms of your blood sugar being too high or too low.  please keep a record of the readings and bring it to your next appointment here (or you can bring the meter itself).  You can write it on any piece of paper.  please call us sooner if your blood sugar goes below 70, or if you have a lot of readings over 200.   Please come back for a follow-up appointment in 1 month.

## 2019-05-21 ENCOUNTER — Other Ambulatory Visit: Payer: Self-pay

## 2019-05-21 ENCOUNTER — Telehealth: Payer: Self-pay | Admitting: Endocrinology

## 2019-05-21 MED ORDER — FLUCONAZOLE 100 MG PO TABS: 100.0000 mg | ORAL_TABLET | Freq: Every day | ORAL | 99 refills | Status: DC

## 2019-05-21 NOTE — Telephone Encounter (Signed)
Please review and advise if you agree with requested refill

## 2019-05-21 NOTE — Telephone Encounter (Signed)
MEDICATION: fluconazole (DIFLUCAN) 100 MG tablet  PHARMACY:   [facility], [place]. Phone:  [phone]  Fax:  [phone]      IS THIS A 90 DAY SUPPLY : No  IS PATIENT OUT OF MEDICATION: Yes  IF NOT; HOW MUCH IS LEFT: 0  LAST APPOINTMENT DATE: @12 /14/2020  NEXT APPOINTMENT DATE:@Visit  date not found  DO WE HAVE YOUR PERMISSION TO LEAVE A DETAILED MESSAGE: Yes  OTHER COMMENTS:    **Let patient know to contact pharmacy at the end of the day to make sure medication is ready. **  ** Please notify patient to allow 48-72 hours to process**  **Encourage patient to contact the pharmacy for refills or they can request refills through MYCHART**=

## 2019-09-13 ENCOUNTER — Encounter: Payer: Self-pay | Admitting: *Deleted

## 2019-10-11 ENCOUNTER — Ambulatory Visit: Payer: Medicaid Other | Admitting: Endocrinology

## 2019-11-19 ENCOUNTER — Other Ambulatory Visit: Payer: Self-pay

## 2019-11-19 ENCOUNTER — Telehealth: Payer: Self-pay | Admitting: Endocrinology

## 2019-11-19 DIAGNOSIS — B379 Candidiasis, unspecified: Secondary | ICD-10-CM

## 2019-11-19 MED ORDER — FLUCONAZOLE 100 MG PO TABS: 100.0000 mg | ORAL_TABLET | Freq: Every day | ORAL | 99 refills | Status: DC

## 2019-11-19 NOTE — Telephone Encounter (Signed)
Patient re: Patient has very bad yeast infection and requests the following refill:  Medication Refill Request  Did you call your pharmacy and request this refill first? No-patient states Dr. Cordelia Pen Nurse told patient to call our office first. Patient states she needs the RX sent in asap-Urgent. . If patient has not contacted pharmacy first, instruct them to do so for future refills.  . Remind them that contacting the pharmacy for their refill is the quickest method to get the refill.  . Refill policy also stated that it will take anywhere between 24-72 hours to receive the refill.    Name of medication?  fluconazole (DIFLUCAN) 100 MG tablet  Is this a 90 day supply? No  Name and location of pharmacy?   [facility], [place]. Phone:  [phone]  Fax:  [phone]      . Is the request for diabetes test strips? No . If yes, what brand? N/A

## 2019-11-19 NOTE — Telephone Encounter (Signed)
Outpatient Medication Detail   Disp Refills Start End   fluconazole (DIFLUCAN) 100 MG tablet 1 tablet prn 11/19/2019    Sig - Route: Take 1 tablet (100 mg total) by mouth daily. - Oral   Sent to pharmacy as: fluconazole (DIFLUCAN) 100 MG tablet   E-Prescribing Status: Receipt confirmed by pharmacy (11/19/2019 12:42 PM EDT)

## 2019-11-19 NOTE — Telephone Encounter (Signed)
Please review and advise.

## 2019-11-19 NOTE — Telephone Encounter (Signed)
1.  Please schedule f/u appt 2.  Then please refill x 1, pending that appt.  

## 2019-12-11 ENCOUNTER — Ambulatory Visit: Payer: Medicaid Other | Admitting: Endocrinology

## 2019-12-11 ENCOUNTER — Other Ambulatory Visit: Payer: Self-pay

## 2020-05-15 ENCOUNTER — Telehealth: Payer: Self-pay | Admitting: Endocrinology

## 2020-05-15 ENCOUNTER — Ambulatory Visit: Payer: Medicaid Other | Admitting: Endocrinology

## 2020-05-15 NOTE — Telephone Encounter (Signed)
error 

## 2020-05-21 ENCOUNTER — Telehealth: Payer: Self-pay | Admitting: Endocrinology

## 2020-05-21 DIAGNOSIS — E1040 Type 1 diabetes mellitus with diabetic neuropathy, unspecified: Secondary | ICD-10-CM

## 2020-05-21 NOTE — Telephone Encounter (Signed)
1.  Please schedule f/u appt 2.  Then please refill the medicaid-covered meter brand, and strips x 2 mos, pending that appt.

## 2020-05-21 NOTE — Telephone Encounter (Signed)
Patient called stating she does not have a meter and has not had one in a while, she moved recently and her meter got lost. She states the medicaid does not cover the meter and is unsure what to do - do we possibly have one in office she could have? Ph# [phone]

## 2020-05-21 NOTE — Telephone Encounter (Signed)
Called and left a message for patient to call back. Appointment needs to be scheduled and we need to know which meter is covered by her insurance and we can send another Rx to the pharmacy.

## 2020-05-21 NOTE — Telephone Encounter (Signed)
Patient returned call. Patient is scheduled with Dr. Loanne Drilling on 06/10/20. Patient states her Pharmacist told her that Medicaid no longer covers meters. Patient requests that a RX for a Meter that is covered by Medicaid be sent to  [facility], [place]. Phone:  [phone]  Fax:  [phone]     (Patient states she will call her  insurance company to hopefully find out what Meter brand they cover)

## 2020-05-21 NOTE — Telephone Encounter (Signed)
Can we get her a sample meter?

## 2020-05-28 MED ORDER — ACCU-CHEK AVIVA PLUS W/DEVICE KIT: PACK | 0 refills | Status: DC

## 2020-05-28 NOTE — Addendum Note (Signed)
Addended by: Lauralyn Primes on: 05/28/2020 08:00 AM   Modules accepted: Orders

## 2020-05-28 NOTE — Telephone Encounter (Signed)
Rx for meter and supplies sent to preferred pharmacy.

## 2020-06-10 ENCOUNTER — Encounter: Payer: Self-pay | Admitting: Endocrinology

## 2020-06-10 ENCOUNTER — Ambulatory Visit (INDEPENDENT_AMBULATORY_CARE_PROVIDER_SITE_OTHER): Payer: Medicaid Other | Admitting: Endocrinology

## 2020-06-10 ENCOUNTER — Other Ambulatory Visit: Payer: Self-pay

## 2020-06-10 VITALS — BP 110/64 | HR 68 | Ht 65.0 in | Wt 105.0 lb

## 2020-06-10 DIAGNOSIS — E1040 Type 1 diabetes mellitus with diabetic neuropathy, unspecified: Secondary | ICD-10-CM

## 2020-06-10 DIAGNOSIS — R110 Nausea: Secondary | ICD-10-CM | POA: Diagnosis not present

## 2020-06-10 DIAGNOSIS — R112 Nausea with vomiting, unspecified: Secondary | ICD-10-CM | POA: Insufficient documentation

## 2020-06-10 LAB — HEPATIC FUNCTION PANEL
ALT: 7 U/L (ref 0–35)
AST: 14 U/L (ref 0–37)
Albumin: 3 g/dL — ABNORMAL LOW (ref 3.5–5.2)
Alkaline Phosphatase: 78 U/L (ref 39–117)
Bilirubin, Direct: 0.1 mg/dL (ref 0.0–0.3)
Total Bilirubin: 0.7 mg/dL (ref 0.2–1.2)
Total Protein: 6.4 g/dL (ref 6.0–8.3)

## 2020-06-10 LAB — BASIC METABOLIC PANEL
BUN: 13 mg/dL (ref 6–23)
CO2: 31 mEq/L (ref 19–32)
Calcium: 8.9 mg/dL (ref 8.4–10.5)
Chloride: 97 mEq/L (ref 96–112)
Creatinine, Ser: 1.24 mg/dL — ABNORMAL HIGH (ref 0.40–1.20)
GFR: 57.87 mL/min — ABNORMAL LOW (ref >60.00)
Glucose, Bld: 233 mg/dL — ABNORMAL HIGH (ref 70–99)
Potassium: 4.9 mEq/L (ref 3.5–5.1)
Sodium: 131 mEq/L — ABNORMAL LOW (ref 135–145)

## 2020-06-10 LAB — CBC WITH DIFFERENTIAL/PLATELET
Basophils Absolute: 0 10*3/uL (ref 0.0–0.1)
Basophils Relative: 0.7 % (ref 0.0–3.0)
Eosinophils Absolute: 0.1 10*3/uL (ref 0.0–0.7)
Eosinophils Relative: 2.1 % (ref 0.0–5.0)
HCT: 31.9 % — ABNORMAL LOW (ref 36.0–46.0)
Hemoglobin: 11 g/dL — ABNORMAL LOW (ref 12.0–15.0)
Lymphocytes Relative: 36 % (ref 12.0–46.0)
Lymphs Abs: 1.9 10*3/uL (ref 0.7–4.0)
MCHC: 34.4 g/dL (ref 30.0–36.0)
MCV: 86.2 fl (ref 78.0–100.0)
Monocytes Absolute: 0.4 10*3/uL (ref 0.1–1.0)
Monocytes Relative: 8 % (ref 3.0–12.0)
Neutro Abs: 2.8 10*3/uL (ref 1.4–7.7)
Neutrophils Relative %: 53.2 % (ref 43.0–77.0)
Platelets: 449 10*3/uL — ABNORMAL HIGH (ref 150.0–400.0)
RBC: 3.7 Mil/uL — ABNORMAL LOW (ref 3.87–5.11)
RDW: 13.1 % (ref 11.5–15.5)
WBC: 5.2 10*3/uL (ref 4.0–10.5)

## 2020-06-10 LAB — POCT GLYCOSYLATED HEMOGLOBIN (HGB A1C): Hemoglobin A1C: 11.9 % — AB (ref 4.0–5.6)

## 2020-06-10 LAB — AMYLASE: Amylase: 26 U/L — ABNORMAL LOW (ref 27–131)

## 2020-06-10 MED ORDER — ONDANSETRON HCL 4 MG PO TABS: 4.0000 mg | ORAL_TABLET | Freq: Three times a day (TID) | ORAL | 0 refills | Status: DC | PRN

## 2020-06-10 MED ORDER — NOVOLIN N FLEXPEN 100 UNIT/ML ~~LOC~~ SUPN: 20.0000 [IU] | PEN_INJECTOR | SUBCUTANEOUS | 11 refills | Status: DC

## 2020-06-10 NOTE — Progress Notes (Signed)
Subjective:    Patient ID: Ann Robertson, female    DOB: February 02, 1991, 30 y.o.   MRN: [id number]  HPI Pt returns for f/u of diabetes mellitus: DM type: 1 Dx'ed: 2542 Complications: PN, CRI and DN.   Therapy: insulin since dx DKA: last episode was in 2008.  Severe hypoglycemia: last episode was approx 2012.   Pancreatitis: never.   Other: she took pump rx only during the 2014-2015 pregnancy, and V-GO device during 2016 pregnancy; she takes qd insulin, after poor results with multiple daily injections; she has had TL;  Pt has h/o multiple no-shows and cancellations; she prefers insulin from vials.   Interval history: no cbg record, but states cbg varies from 48-500.  It is in general lowest in the middle of the night.  Pt says she sometimes misses the insulin.  She has chronic nausea.  She takes Lantus 20/d, and SS-NPH.   Past Medical History:  Diagnosis Date  . Diabetes mellitus   . High cholesterol   . HSV-2 infection   . Juvenile diabetes 2000  . Juvenile diabetes mellitus   . Neuropathy   . Preeclampsia   . STD (sexually transmitted disease)     Past Surgical History:  Procedure Laterality Date  . CESAREAN SECTION    . CESAREAN SECTION N/A 07/05/2013   Procedure: CESAREAN SECTION;  Surgeon: Emily Filbert, MD;  Location: [facility];  Service: Obstetrics;  Laterality: N/A;  . CESAREAN SECTION N/A 07/25/2014   Procedure: CESAREAN SECTION;  Surgeon: Donnamae Jude, MD;  Location: [facility];  Service: Obstetrics;  Laterality: N/A;  . CESAREAN SECTION N/A 11/21/2015   Procedure: CESAREAN SECTION;  Surgeon: Donnamae Jude, MD;  Location: [facility];  Service: Obstetrics;  Laterality: N/A;  . WISDOM TOOTH EXTRACTION      Social History   Socioeconomic History  . Marital status: Single    Spouse name: Not on file  . Number of children: Not on file  . Years of education: Not on file  . Highest education level: Not on file  Occupational History  . Not on file  Tobacco Use  .  Smoking status: Current Every Day Smoker    Packs/day: 0.25    Years: 9.00    Pack years: 2.25    Types: Cigarettes  . Smokeless tobacco: Never Used  Vaping Use  . Vaping Use: Not on file  Substance and Sexual Activity  . Alcohol use: Yes    Comment: occasional  . Drug use: No  . Sexual activity: Not Currently    Birth control/protection: None  Other Topics Concern  . Not on file  Social History Narrative  . Not on file   Social Determinants of Health   Financial Resource Strain: Not on file  Food Insecurity: Not on file  Transportation Needs: Not on file  Physical Activity: Not on file  Stress: Not on file  Social Connections: Not on file  Intimate Partner Violence: Not on file    Current Outpatient Medications on File Prior to Visit  Medication Sig Dispense Refill  . Blood Glucose Monitoring Suppl (ACCU-CHEK AVIVA PLUS) w/Device KIT Use to check sugar at least 4 times daily. 1 kit 0  . fluconazole (DIFLUCAN) 100 MG tablet Take 1 tablet (100 mg total) by mouth daily. 1 tablet prn  . glucose blood (ACCU-CHEK AVIVA PLUS) test strip 1 each by Other route 4 (four) times daily. And lancets 4/day 400 each 3  . Insulin Pen Needle (PEN  NEEDLES) 30G X 8 MM MISC 1 each by Does not apply route daily. E11.9 90 each 0  . Insulin Syringe-Needle U-100 (RELION INSULIN SYR 0.3CC/30G) 30G X 5/16" 0.3 ML MISC USED TO INJECT INSULIN ONCE DAILY. 100 each 11  . ondansetron (ZOFRAN ODT) 4 MG disintegrating tablet Take 1 tablet (4 mg total) by mouth every 8 (eight) hours as needed for nausea or vomiting. 10 tablet 0   No current facility-administered medications on file prior to visit.    Allergies  Allergen Reactions  . Hydromorphone Hcl Itching and Rash    Family History  Problem Relation Age of Onset  . Stroke Mother   . Hypertension Mother   . Heart disease Mother   . Kidney disease Mother   . Hyperlipidemia Father   . Diabetes Father     BP 110/64   Pulse 68   Ht 5' 5"  (1.651  m)   Wt 105 lb (47.6 kg)   SpO2 97%   BMI 17.47 kg/m    Review of Systems Denies LOC    Objective:   Physical Exam VITAL SIGNS:  See vs page GENERAL: tearful.   Pulses: dorsalis pedis intact bilat.   MSK: no deformity of the feet CV: no leg edema Skin:  no ulcer on the feet.  normal color and temp on the feet.   Neuro: sensation is intact to touch on the feet.     Lab Results  Component Value Date   HGBA1C 11.9 (A) 06/10/2020      Assessment & Plan:  Nausea, poss due to GP Type 1 DM, with CRI: poor glycemic control. The pattern of reported cbg's suggests she needs a faster-acting qam insulin.   Patient Instructions  I have sent a prescription to your pharmacy, to change the Lantus to NPH, 20 units each morning.  This will be your only insulin. Blood tests are requested for you today.  We'll let you know about the results.  Let's check a stomach x-ray.  you will receive a phone call, about a day and time for an appointment.   I have sent a prescription to your pharmacy, for the nausea.  check your blood sugar 4 times a day: before the 3 meals, and at bedtime.  also check if you have symptoms of your blood sugar being too high or too low.  please keep a record of the readings and bring it to your next appointment here (or you can bring the meter itself).  You can write it on any piece of paper.  please call us sooner if your blood sugar goes below 70, or if you have a lot of readings over 200.   Please come back for a follow-up appointment in 2 weeks.

## 2020-06-10 NOTE — Patient Instructions (Addendum)
I have sent a prescription to your pharmacy, to change the Lantus to NPH, 20 units each morning.  This will be your only insulin. Blood tests are requested for you today.  We'll let you know about the results.  Let's check a stomach x-ray.  you will receive a phone call, about a day and time for an appointment.   I have sent a prescription to your pharmacy, for the nausea.  check your blood sugar 4 times a day: before the 3 meals, and at bedtime.  also check if you have symptoms of your blood sugar being too high or too low.  please keep a record of the readings and bring it to your next appointment here (or you can bring the meter itself).  You can write it on any piece of paper.  please call us sooner if your blood sugar goes below 70, or if you have a lot of readings over 200.   Please come back for a follow-up appointment in 2 weeks.

## 2020-06-19 ENCOUNTER — Other Ambulatory Visit: Payer: Self-pay

## 2020-06-19 ENCOUNTER — Other Ambulatory Visit: Payer: Self-pay | Admitting: Endocrinology

## 2020-06-19 ENCOUNTER — Telehealth (INDEPENDENT_AMBULATORY_CARE_PROVIDER_SITE_OTHER): Payer: Medicaid Other | Admitting: Endocrinology

## 2020-06-19 DIAGNOSIS — E1040 Type 1 diabetes mellitus with diabetic neuropathy, unspecified: Secondary | ICD-10-CM

## 2020-06-19 MED ORDER — NOVOLIN 70/30 FLEXPEN (70-30) 100 UNIT/ML ~~LOC~~ SUPN: 20.0000 [IU] | PEN_INJECTOR | Freq: Every day | SUBCUTANEOUS | 11 refills | Status: DC

## 2020-06-19 MED ORDER — HUMULIN 70/30 KWIKPEN (70-30) 100 UNIT/ML ~~LOC~~ SUPN: 20.0000 [IU] | PEN_INJECTOR | Freq: Every day | SUBCUTANEOUS | 11 refills | Status: DC

## 2020-06-19 NOTE — Patient Instructions (Addendum)
I have sent a prescription to your pharmacy, to change the NPH to 70/30, 20 units each morning.  This will be your only insulin.  check your blood sugar 4 times a day: before the 3 meals, and at bedtime.  also check if you have symptoms of your blood sugar being too high or too low.  please keep a record of the readings and bring it to your next appointment here (or you can bring the meter itself).  You can write it on any piece of paper.  please call us sooner if your blood sugar goes below 70, or if you have a lot of readings over 200.   Please come back for a follow-up appointment in 3-4 weeks.

## 2020-06-19 NOTE — Progress Notes (Signed)
Subjective:    Patient ID: Ann Robertson, female    DOB: 25-Aug-1990, 30 y.o.   MRN: [id number]  HPI  telehealth visit today via telephone x 17 minutes.   Alternatives to telehealth are presented to this patient, and the patient agrees to the telehealth visit. Pt is advised of the cost of the visit, and agrees to this, also.   Patient is at home, and I am at the office.   Persons attending the telehealth visit: the patient and I Pt returns for f/u of diabetes mellitus:   DM type: 1 Dx'ed: 1999 Complications: PN, CRI and DN.   Therapy: insulin since dx DKA: last episode was in 2008.  Severe hypoglycemia: last episode was approx 2012.   Pancreatitis: never.   Other: she took pump rx only during the 2014-2015 pregnancy, and V-GO device during 2016 pregnancy; she takes qd insulin, after poor results with multiple daily injections; she has had TL;  Pt has h/o multiple no-shows and cancellations; she prefers insulin from vials.   Interval history: She takes NPH, 20 units qam.  She says cbg varies from 48-400.  It is in general lowest at midnight, and highest at other times of day.   Past Medical History:  Diagnosis Date  . Diabetes mellitus   . High cholesterol   . HSV-2 infection   . Juvenile diabetes 2000  . Juvenile diabetes mellitus   . Neuropathy   . Preeclampsia   . STD (sexually transmitted disease)     Past Surgical History:  Procedure Laterality Date  . CESAREAN SECTION    . CESAREAN SECTION N/A 07/05/2013   Procedure: CESAREAN SECTION;  Surgeon: Allie Bossier, MD;  Location: [facility];  Service: Obstetrics;  Laterality: N/A;  . CESAREAN SECTION N/A 07/25/2014   Procedure: CESAREAN SECTION;  Surgeon: Reva Bores, MD;  Location: [facility];  Service: Obstetrics;  Laterality: N/A;  . CESAREAN SECTION N/A 11/21/2015   Procedure: CESAREAN SECTION;  Surgeon: Reva Bores, MD;  Location: [facility];  Service: Obstetrics;  Laterality: N/A;  . WISDOM TOOTH EXTRACTION       Social History   Socioeconomic History  . Marital status: Single    Spouse name: Not on file  . Number of children: Not on file  . Years of education: Not on file  . Highest education level: Not on file  Occupational History  . Not on file  Tobacco Use  . Smoking status: Current Every Day Smoker    Packs/day: 0.25    Years: 9.00    Pack years: 2.25    Types: Cigarettes  . Smokeless tobacco: Never Used  Vaping Use  . Vaping Use: Not on file  Substance and Sexual Activity  . Alcohol use: Yes    Comment: occasional  . Drug use: No  . Sexual activity: Not Currently    Birth control/protection: None  Other Topics Concern  . Not on file  Social History Narrative  . Not on file   Social Determinants of Health   Financial Resource Strain: Not on file  Food Insecurity: Not on file  Transportation Needs: Not on file  Physical Activity: Not on file  Stress: Not on file  Social Connections: Not on file  Intimate Partner Violence: Not on file    Current Outpatient Medications on File Prior to Visit  Medication Sig Dispense Refill  . Blood Glucose Monitoring Suppl (ACCU-CHEK AVIVA PLUS) w/Device KIT Use to check sugar at least 4 times  daily. 1 kit 0  . fluconazole (DIFLUCAN) 100 MG tablet Take 1 tablet (100 mg total) by mouth daily. 1 tablet prn  . glucose blood (ACCU-CHEK AVIVA PLUS) test strip 1 each by Other route 4 (four) times daily. And lancets 4/day 400 each 3  . Insulin Pen Needle (PEN NEEDLES) 30G X 8 MM MISC 1 each by Does not apply route daily. E11.9 90 each 0  . Insulin Syringe-Needle U-100 (RELION INSULIN SYR 0.3CC/30G) 30G X 5/16" 0.3 ML MISC USED TO INJECT INSULIN ONCE DAILY. 100 each 11  . ondansetron (ZOFRAN ODT) 4 MG disintegrating tablet Take 1 tablet (4 mg total) by mouth every 8 (eight) hours as needed for nausea or vomiting. 10 tablet 0  . ondansetron (ZOFRAN) 4 MG tablet Take 1 tablet (4 mg total) by mouth every 8 (eight) hours as needed for nausea or  vomiting. 20 tablet 0   No current facility-administered medications on file prior to visit.    Allergies  Allergen Reactions  . Hydromorphone Hcl Itching and Rash    Family History  Problem Relation Age of Onset  . Stroke Mother   . Hypertension Mother   . Heart disease Mother   . Kidney disease Mother   . Hyperlipidemia Father   . Diabetes Father     There were no vitals taken for this visit.   Review of Systems     Objective:   Physical Exam      Assessment & Plan:  Type 1 DM: uncontrolled Hypoglycemia, due to insulin: based on the pattern of cbg's, she needs a faster-acting qam insulin.  Patient Instructions  I have sent a prescription to your pharmacy, to change the NPH to 70/30, 20 units each morning.  This will be your only insulin.  check your blood sugar 4 times a day: before the 3 meals, and at bedtime.  also check if you have symptoms of your blood sugar being too high or too low.  please keep a record of the readings and bring it to your next appointment here (or you can bring the meter itself).  You can write it on any piece of paper.  please call us sooner if your blood sugar goes below 70, or if you have a lot of readings over 200.   Please come back for a follow-up appointment in 3-4 weeks.

## 2020-06-20 ENCOUNTER — Telehealth: Payer: Self-pay | Admitting: Endocrinology

## 2020-06-20 NOTE — Telephone Encounter (Signed)
This is a question for [facility]

## 2020-06-20 NOTE — Telephone Encounter (Signed)
Pt stated --have problem referral available date 07/15/20. Ask the pt if they can change it but only available 07/14/20. Pt requesting another referral somewhere else. Please advise

## 2020-06-20 NOTE — Telephone Encounter (Signed)
Patient called and wants a new referral to a provider than can see her before 07/15/2020.  Please contact [phone]

## 2020-06-23 NOTE — Telephone Encounter (Signed)
Called [facility]-- change appt for the pt  06/27/20 @ 7:15 am. Notified pt with the new appt.

## 2020-06-25 ENCOUNTER — Ambulatory Visit (HOSPITAL_COMMUNITY)
Admission: RE | Admit: 2020-06-25 | Discharge: 2020-06-25 | Disposition: A | Discharge status: Home / Self Care | Payer: Medicaid Other | Source: Ambulatory Visit | Attending: Endocrinology | Admitting: Endocrinology

## 2020-06-25 ENCOUNTER — Other Ambulatory Visit: Payer: Self-pay

## 2020-06-25 DIAGNOSIS — R110 Nausea: Secondary | ICD-10-CM | POA: Insufficient documentation

## 2020-06-25 MED ORDER — TECHNETIUM TC 99M SULFUR COLLOID
2.0000 | Freq: Once | INTRAVENOUS | Status: AC | PRN
  Administered 2020-06-25: 2 via INTRAVENOUS

## 2020-06-26 ENCOUNTER — Other Ambulatory Visit: Payer: Self-pay | Admitting: Endocrinology

## 2020-06-26 DIAGNOSIS — R110 Nausea: Secondary | ICD-10-CM

## 2020-07-14 ENCOUNTER — Encounter (HOSPITAL_COMMUNITY): Payer: Medicaid Other

## 2020-07-15 ENCOUNTER — Encounter (HOSPITAL_COMMUNITY): Payer: Medicaid Other

## 2020-07-16 ENCOUNTER — Ambulatory Visit: Payer: Medicaid Other | Admitting: Gastroenterology

## 2020-08-11 ENCOUNTER — Other Ambulatory Visit: Payer: Self-pay

## 2020-08-11 ENCOUNTER — Telehealth: Payer: Self-pay | Admitting: Endocrinology

## 2020-08-11 ENCOUNTER — Telehealth: Payer: Self-pay

## 2020-08-11 ENCOUNTER — Other Ambulatory Visit: Payer: Self-pay | Admitting: Endocrinology

## 2020-08-11 MED ORDER — ACCU-CHEK GUIDE VI STRP: ORAL_STRIP | 12 refills | Status: DC

## 2020-08-11 NOTE — Telephone Encounter (Signed)
Patient states that she needs all her medications refilled.  Asked if she had called her pharmacy and she said no and that she does not do that.  Patient was told that refills can take 24-48 hours and her response was "no, not mine.  It is insulin."  Patient refused to give me the names of medications. States "she needs her medications like yesterday and this is not her 1st rodeo. She is not going give me the names and that the nurse must call her at [phone]"

## 2020-08-11 NOTE — Telephone Encounter (Signed)
To add: other questions are for primary care provider

## 2020-08-11 NOTE — Telephone Encounter (Signed)
Spoke with pt to let her know that her PA for Novolin 70/30 was denied. She stated that she needs something now bc she is out of her medication.  Also pt stated that she needs a refill on Gabapentin, and something for a yeast infection.  Please Advise

## 2020-08-11 NOTE — Telephone Encounter (Signed)
Epic says already changed to Humulin 70/30. 1.  Please schedule f/u appt 2.  Then please refill x 2 mos, pending that appt.

## 2020-08-11 NOTE — Telephone Encounter (Signed)
Are you going to send in gabapentin and something for the yeast infection

## 2020-08-12 NOTE — Telephone Encounter (Signed)
Spoke with pt in regards to getting a refill for gabapentin and something for a yeast infection and told her that Ann Robertson cannot prescribe these anymore and she will need to F/U with her primary care.

## 2020-08-12 NOTE — Telephone Encounter (Signed)
Dr.Ellison I have spoke with Ann Robertson in regards of you refilling her gabapentin and prescribing her something you a yeast infection and she stated that she does not see why you can not prescribe this for her bc you have filled it in the past bc the gabapentin is for the pain that she has with neuropathy with diabetes and then with her having yeast infections all the time.  She want to talk to you.

## 2020-08-12 NOTE — Telephone Encounter (Signed)
These are questions for primary care provider

## 2020-08-12 NOTE — Telephone Encounter (Signed)
These are outside the scope of my practice.

## 2020-09-10 ENCOUNTER — Ambulatory Visit (INDEPENDENT_AMBULATORY_CARE_PROVIDER_SITE_OTHER): Payer: Medicaid Other | Admitting: Endocrinology

## 2020-09-10 ENCOUNTER — Other Ambulatory Visit: Payer: Self-pay

## 2020-09-10 VITALS — BP 168/104 | HR 126 | Ht 65.0 in | Wt 110.4 lb

## 2020-09-10 DIAGNOSIS — E1040 Type 1 diabetes mellitus with diabetic neuropathy, unspecified: Secondary | ICD-10-CM

## 2020-09-10 LAB — BASIC METABOLIC PANEL
BUN: 18 mg/dL (ref 6–23)
CO2: 26 mEq/L (ref 19–32)
Calcium: 8.3 mg/dL — ABNORMAL LOW (ref 8.4–10.5)
Chloride: 99 mEq/L (ref 96–112)
Creatinine, Ser: 1.47 mg/dL — ABNORMAL HIGH (ref 0.40–1.20)
GFR: 47.1 mL/min — ABNORMAL LOW (ref >60.00)
Glucose, Bld: 455 mg/dL — ABNORMAL HIGH (ref 70–99)
Potassium: 5.8 mEq/L — ABNORMAL HIGH (ref 3.5–5.1)
Sodium: 129 mEq/L — ABNORMAL LOW (ref 135–145)

## 2020-09-10 LAB — TSH: TSH: 3.09 u[IU]/mL (ref 0.35–4.50)

## 2020-09-10 LAB — POCT GLYCOSYLATED HEMOGLOBIN (HGB A1C): HbA1c POC (<> result, manual entry): >15 % (ref 4.0–5.6)

## 2020-09-10 LAB — T4, FREE: Free T4: 0.76 ng/dL (ref 0.60–1.60)

## 2020-09-10 MED ORDER — LANTUS SOLOSTAR 100 UNIT/ML ~~LOC~~ SOPN: 12.0000 [IU] | PEN_INJECTOR | SUBCUTANEOUS | 3 refills | Status: DC

## 2020-09-10 NOTE — Patient Instructions (Addendum)
Your blood pressure is high today.  Please see your primary care provider soon, to have it rechecked.   I have sent a prescription to your pharmacy, for the Lantus, 12 units each morning.  This will be your only insulin.    check your blood sugar 4 times a day: before the 3 meals, and at bedtime.  also check if you have symptoms of your blood sugar being too high or too low.  please keep a record of the readings and bring it to your next appointment here (or you can bring the meter itself).  You can write it on any piece of paper.  please call us sooner if your blood sugar goes below 70, or if you have a lot of readings over 200.   Please call or message [place] next week, to tell us how the blood sugar is doing.   Please come back for a follow-up appointment in 6 weeks.

## 2020-09-10 NOTE — Progress Notes (Signed)
Subjective:    Patient ID: Ann Robertson, female    DOB: 04/29/1991, 30 y.o.   MRN: [id number]  HPI Pt returns for f/u of diabetes mellitus:  DM type: 1 Dx'ed: 2353 Complications: PPN, CRI and DN.   Therapy: insulin since dx DKA: last episode was in 2008.  Severe hypoglycemia: last episode was approx 2012.   Pancreatitis: never.   Other: she took pump rx only during the 2014-2015 pregnancy, and V-GO device during 2016 pregnancy; she takes qd insulin, after poor results with multiple daily injections; she has had TL;  Pt has h/o multiple no-shows and cancellations.   Interval history: no cbg record, but states cbg varies from 36-500.  It is in general lowest in the middle of the night.  She takes Lantus 10-15 units QAM, but she sometimes misses. She says medicaid does not cover 70/30.   Past Medical History:  Diagnosis Date  . Diabetes mellitus   . High cholesterol   . HSV-2 infection   . Juvenile diabetes 2000  . Juvenile diabetes mellitus   . Neuropathy   . Preeclampsia   . STD (sexually transmitted disease)     Past Surgical History:  Procedure Laterality Date  . CESAREAN SECTION    . CESAREAN SECTION N/A 07/05/2013   Procedure: CESAREAN SECTION;  Surgeon: Emily Filbert, MD;  Location: [facility];  Service: Obstetrics;  Laterality: N/A;  . CESAREAN SECTION N/A 07/25/2014   Procedure: CESAREAN SECTION;  Surgeon: Donnamae Jude, MD;  Location: [facility];  Service: Obstetrics;  Laterality: N/A;  . CESAREAN SECTION N/A 11/21/2015   Procedure: CESAREAN SECTION;  Surgeon: Donnamae Jude, MD;  Location: [facility];  Service: Obstetrics;  Laterality: N/A;  . WISDOM TOOTH EXTRACTION      Social History   Socioeconomic History  . Marital status: Single    Spouse name: Not on file  . Number of children: Not on file  . Years of education: Not on file  . Highest education level: Not on file  Occupational History  . Not on file  Tobacco Use  . Smoking status: Current Every Day  Smoker    Packs/day: 0.25    Years: 9.00    Pack years: 2.25    Types: Cigarettes  . Smokeless tobacco: Never Used  Vaping Use  . Vaping Use: Not on file  Substance and Sexual Activity  . Alcohol use: Yes    Comment: occasional  . Drug use: No  . Sexual activity: Not Currently    Birth control/protection: None  Other Topics Concern  . Not on file  Social History Narrative  . Not on file   Social Determinants of Health   Financial Resource Strain: Not on file  Food Insecurity: Not on file  Transportation Needs: Not on file  Physical Activity: Not on file  Stress: Not on file  Social Connections: Not on file  Intimate Partner Violence: Not on file    Current Outpatient Medications on File Prior to Visit  Medication Sig Dispense Refill  . Blood Glucose Monitoring Suppl (ACCU-CHEK AVIVA PLUS) w/Device KIT Use to check sugar at least 4 times daily. 1 kit 0  . fluconazole (DIFLUCAN) 100 MG tablet Take 1 tablet (100 mg total) by mouth daily. 1 tablet prn  . glucose blood (ACCU-CHEK GUIDE) test strip Use as instructed 100 each 12  . Insulin Pen Needle (PEN NEEDLES) 30G X 8 MM MISC 1 each by Does not apply route daily. E11.9 90  each 0  . ondansetron (ZOFRAN ODT) 4 MG disintegrating tablet Take 1 tablet (4 mg total) by mouth every 8 (eight) hours as needed for nausea or vomiting. 10 tablet 0  . ondansetron (ZOFRAN) 4 MG tablet Take 1 tablet (4 mg total) by mouth every 8 (eight) hours as needed for nausea or vomiting. 20 tablet 0   No current facility-administered medications on file prior to visit.    Allergies  Allergen Reactions  . Hydromorphone Hcl Itching and Rash    Family History  Problem Relation Age of Onset  . Stroke Mother   . Hypertension Mother   . Heart disease Mother   . Kidney disease Mother   . Hyperlipidemia Father   . Diabetes Father     BP (!) 168/104 (BP Location: Right Arm, Patient Position: Sitting, Cuff Size: Normal)   Pulse (!) 126   Ht 5' 5"   (1.651 m)   Wt 110 lb 6.4 oz (50.1 kg)   SpO2 97%   BMI 18.37 kg/m    Review of Systems Denies n/v/LOC    Objective:   Physical Exam VITAL SIGNS:  See vs page.  GENERAL: no distress Pulses: dorsalis pedis intact bilat.   MSK: no deformity of the feet CV: trace bilat leg edema.   Skin:  no ulcer on the feet.  normal color and temp on the feet. Neuro: sensation is intact to touch on the feet.    Lab Results  Component Value Date   HGBA1C >15 09/10/2020       Assessment & Plan:  Type 1 DM: very poor control, due to noncompliance.  Hypoglycemia, due to insulin.  HTN: is noted today  Patient Instructions  Your blood pressure is high today.  Please see your primary care provider soon, to have it rechecked.   I have sent a prescription to your pharmacy, for the Lantus, 12 units each morning.  This will be your only insulin.    check your blood sugar 4 times a day: before the 3 meals, and at bedtime.  also check if you have symptoms of your blood sugar being too high or too low.  please keep a record of the readings and bring it to your next appointment here (or you can bring the meter itself).  You can write it on any piece of paper.  please call us sooner if your blood sugar goes below 70, or if you have a lot of readings over 200.   Please call or message [place] next week, to tell us how the blood sugar is doing.   Please come back for a follow-up appointment in 6 weeks.

## 2020-09-12 ENCOUNTER — Encounter (HOSPITAL_BASED_OUTPATIENT_CLINIC_OR_DEPARTMENT_OTHER): Payer: Self-pay | Admitting: *Deleted

## 2020-09-12 ENCOUNTER — Other Ambulatory Visit: Payer: Self-pay

## 2020-09-12 ENCOUNTER — Emergency Department (HOSPITAL_BASED_OUTPATIENT_CLINIC_OR_DEPARTMENT_OTHER)
Admission: EM | Admit: 2020-09-12 | Discharge: 2020-09-12 | Disposition: A | Discharge status: Home / Self Care | Payer: Medicaid Other | Attending: Emergency Medicine | Admitting: Emergency Medicine

## 2020-09-12 DIAGNOSIS — N049 Nephrotic syndrome with unspecified morphologic changes: Secondary | ICD-10-CM | POA: Insufficient documentation

## 2020-09-12 DIAGNOSIS — E1040 Type 1 diabetes mellitus with diabetic neuropathy, unspecified: Secondary | ICD-10-CM | POA: Diagnosis not present

## 2020-09-12 DIAGNOSIS — F17210 Nicotine dependence, cigarettes, uncomplicated: Secondary | ICD-10-CM | POA: Diagnosis not present

## 2020-09-12 DIAGNOSIS — I10 Essential (primary) hypertension: Secondary | ICD-10-CM | POA: Insufficient documentation

## 2020-09-12 DIAGNOSIS — Z794 Long term (current) use of insulin: Secondary | ICD-10-CM | POA: Diagnosis not present

## 2020-09-12 LAB — CBC WITH DIFFERENTIAL/PLATELET
Abs Immature Granulocytes: 0.02 10*3/uL (ref 0.00–0.07)
Basophils Absolute: 0 10*3/uL (ref 0.0–0.1)
Basophils Relative: 0 %
Eosinophils Absolute: 0 10*3/uL (ref 0.0–0.5)
Eosinophils Relative: 0 %
HCT: 27.9 % — ABNORMAL LOW (ref 36.0–46.0)
Hemoglobin: 9.6 g/dL — ABNORMAL LOW (ref 12.0–15.0)
Immature Granulocytes: 0 %
Lymphocytes Relative: 27 %
Lymphs Abs: 1.8 10*3/uL (ref 0.7–4.0)
MCH: 30 pg (ref 26.0–34.0)
MCHC: 34.4 g/dL (ref 30.0–36.0)
MCV: 87.2 fL (ref 80.0–100.0)
Monocytes Absolute: 0.3 10*3/uL (ref 0.1–1.0)
Monocytes Relative: 5 %
Neutro Abs: 4.7 10*3/uL (ref 1.7–7.7)
Neutrophils Relative %: 68 %
Platelets: 335 10*3/uL (ref 150–400)
RBC: 3.2 MIL/uL — ABNORMAL LOW (ref 3.87–5.11)
RDW: 12.8 % (ref 11.5–15.5)
WBC: 6.8 10*3/uL (ref 4.0–10.5)
nRBC: 0 % (ref 0.0–0.2)

## 2020-09-12 LAB — COMPREHENSIVE METABOLIC PANEL
ALT: 15 U/L (ref 0–44)
AST: 18 U/L (ref 15–41)
Albumin: 2 g/dL — ABNORMAL LOW (ref 3.5–5.0)
Alkaline Phosphatase: 110 U/L (ref 38–126)
Anion gap: 6 (ref 5–15)
BUN: 20 mg/dL (ref 6–20)
CO2: 26 mmol/L (ref 22–32)
Calcium: 8.4 mg/dL — ABNORMAL LOW (ref 8.9–10.3)
Chloride: 97 mmol/L — ABNORMAL LOW (ref 98–111)
Creatinine, Ser: 1.41 mg/dL — ABNORMAL HIGH (ref 0.44–1.00)
GFR, Estimated: 51 mL/min — ABNORMAL LOW (ref >60)
Glucose, Bld: 590 mg/dL (ref 70–99)
Potassium: 5.7 mmol/L — ABNORMAL HIGH (ref 3.5–5.1)
Sodium: 129 mmol/L — ABNORMAL LOW (ref 135–145)
Total Bilirubin: 0.5 mg/dL (ref 0.3–1.2)
Total Protein: 5.8 g/dL — ABNORMAL LOW (ref 6.5–8.1)

## 2020-09-12 LAB — URINALYSIS, MICROSCOPIC (REFLEX): RBC / HPF: >50 RBC/hpf (ref 0–5)

## 2020-09-12 LAB — URINALYSIS, ROUTINE W REFLEX MICROSCOPIC
Bilirubin Urine: NEGATIVE
Glucose, UA: >=500 mg/dL — AB
Ketones, ur: NEGATIVE mg/dL
Leukocytes,Ua: NEGATIVE
Nitrite: NEGATIVE
Protein, ur: >300 mg/dL — AB
Specific Gravity, Urine: 1.02 (ref 1.005–1.030)
pH: 7.5 (ref 5.0–8.0)

## 2020-09-12 MED ORDER — CHLORTHALIDONE 25 MG PO TABS: 25.0000 mg | ORAL_TABLET | Freq: Once | ORAL | Status: DC

## 2020-09-12 MED ORDER — CHLORTHALIDONE 25 MG PO TABS: 25.0000 mg | ORAL_TABLET | Freq: Every day | ORAL | 0 refills | Status: DC

## 2020-09-12 NOTE — ED Notes (Signed)
Provider made aware of patients request for an update.

## 2020-09-12 NOTE — ED Notes (Signed)
Medication [place] not available at this location. EDP made aware and order discontinued.

## 2020-09-12 NOTE — Discharge Instructions (Addendum)
As we discussed, you likely have something called nephrotic syndrome which can cause your swelling and protein in your urine.  This could also affect your blood pressure.  We will start you on a diuretic and you should follow up with the kidney doctors next week (please call their office on Monday). If you develop chest pain, difficulty breathing, severe low back pain, new weakness on one side of the body or the other, or difficulty urinating, you should go to the Emergency Room right away.  Watch your sugars very closely and follow the instructions from your endocrinologist for your diabetes.

## 2020-09-12 NOTE — ED Triage Notes (Signed)
She was seen by her MD 2 days ago and her BP was elevated. Her friend took her BP today and it was elevated. No symptoms.

## 2020-09-12 NOTE — ED Notes (Signed)
Pt verbalizes understanding of discharge instructions. Opportunity for questioning and answers were provided. Armand removed by staff, pt discharged from ED ambulatory to Home with Significant Other.

## 2020-09-12 NOTE — ED Provider Notes (Signed)
[facility] EMERGENCY DEPARTMENT Provider Note   CSN: [id number] Arrival date & time: 09/12/20  1853     History Chief Complaint  Patient presents with  . Hypertension    Beth Dalporto is a 30 y.o. female.  Patient is a 30-year-old female with poorly controlled type 1 diabetes who presents with hypertension.  She reports that she was seen by her endocrinologist 2 days ago and was noted to have elevated blood pressure to 168/104.  She was also tachycardic at that time to 126.  She stated that she was told by her endocrinologist to follow-up with her PCP about this, but has not been able to see them yet.  States that she has been urinating normally, denies any chest pain or difficulty breathing.  States that she has noticed some swelling in her legs and around her eyes, which is initially why she presented to her endocrinologist 2 days ago.  Today, she was with her friend who had a blood pressure cuff with her and when she checked her blood pressure it was in the 180s over 110s.  She waited about 30 minutes, this increased to 190s over 110s.  She decided to come to the emergency room.  She does report a headache.  States that her vision is now more blurry with her glasses than without, which started today.  No nausea or vomiting.  She reports that her diabetes is not well controlled.  She states that she has been taking her insulin.        Past Medical History:  Diagnosis Date  . Diabetes mellitus   . High cholesterol   . HSV-2 infection   . Juvenile diabetes 2000  . Juvenile diabetes mellitus   . Neuropathy   . Preeclampsia   . STD (sexually transmitted disease)     Patient Active Problem List   Diagnosis Date Noted  . Nausea 06/10/2020  . Foot pain, right 04/05/2018  . Postpartum care following cesarean delivery 11/21/2015  . Preterm labor 11/16/2015  . Noncompliance with diabetes treatment 11/15/2015  . Traumatic injury during pregnancy in third trimester  11/14/2015  . Maternal chronic hypertension 11/06/2015  . Previous cesarean section complicating pregnancy 07/14/2015  . Short interval between pregnancies complicating pregnancy, antepartum 07/14/2015  . History of preterm delivery, currently pregnant 07/14/2015  . History of pre-eclampsia in prior pregnancy, currently pregnant 07/14/2015  . Supervision of high risk pregnancy, antepartum 07/14/2015  . History of preterm delivery 09/06/2014  . History of polyhydramnios 09/06/2014  . 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) 12/06/2013    Past Surgical History:  Procedure Laterality Date  . CESAREAN SECTION    . CESAREAN SECTION N/A 07/05/2013   Procedure: CESAREAN SECTION;  Surgeon: Myra C Dove, MD;  Location: [facility];  Service: Obstetrics;  Laterality: N/A;  . CESAREAN SECTION N/A 07/25/2014   Procedure: CESAREAN SECTION;  Surgeon: Tanya S Pratt, MD;  Location: [facility];  Service: Obstetrics;  Laterality: N/A;  . CESAREAN SECTION N/A 11/21/2015   Procedure: CESAREAN SECTION;  Surgeon: Tanya S Pratt, MD;  Location: [facility];  Service: Obstetrics;  Laterality: N/A;  . WISDOM TOOTH EXTRACTION       OB History    Gravida  5   Para  4   Term  0   Preterm  4   AB  1   Living  4     SAB    1   IAB  0   Ectopic  0   Multiple  0   Live Births  4           Family History  Problem Relation Age of Onset  . Stroke Mother   . Hypertension Mother   . Heart disease Mother   . Kidney disease Mother   . Hyperlipidemia Father   . Diabetes Father     Social History   Tobacco Use  . Smoking status: Current Every Day Smoker    Packs/day: 0.25    Years: 9.00    Pack years: 2.25    Types: Cigarettes  . Smokeless tobacco: Never Used  Substance Use Topics  . Alcohol use: Yes    Comment: occasional  . Drug use: No    Home Medications Prior to Admission medications    Medication Sig Start Date End Date Taking? Authorizing Provider  Blood Glucose Monitoring Suppl (ACCU-CHEK AVIVA PLUS) w/Device KIT Use to check sugar at least 4 times daily. 05/28/20   Renato Shin, MD  chlorthalidone (HYGROTON) 25 MG tablet Take 1 tablet (25 mg total) by mouth daily. 09/12/20   Tiamarie Furnari, Bernita Raisin, DO  fluconazole (DIFLUCAN) 100 MG tablet Take 1 tablet (100 mg total) by mouth daily. 11/19/19   Renato Shin, MD  glucose blood (ACCU-CHEK GUIDE) test strip Use as instructed 08/11/20   Renato Shin, MD  insulin glargine (LANTUS SOLOSTAR) 100 UNIT/ML Solostar Pen Inject 12 Units into the skin every morning. And pen needles 1/day 09/10/20   Renato Shin, MD  Insulin Pen Needle (PEN NEEDLES) 30G X 8 MM MISC 1 each by Does not apply route daily. E11.9 05/14/19   Renato Shin, MD  ondansetron (ZOFRAN ODT) 4 MG disintegrating tablet Take 1 tablet (4 mg total) by mouth every 8 (eight) hours as needed for nausea or vomiting. 02/18/19   Carlisle Cater, PA-C  ondansetron (ZOFRAN) 4 MG tablet Take 1 tablet (4 mg total) by mouth every 8 (eight) hours as needed for nausea or vomiting. 06/10/20   Renato Shin, MD    Allergies    Hydromorphone hcl  Review of Systems   Review of Systems  Constitutional: Negative for chills, fatigue and fever.  HENT: Negative for congestion and sore throat.   Eyes: Positive for visual disturbance ("my vision is now better without my glasses").       Eye swelling  Respiratory: Negative for cough, chest tightness and shortness of breath.   Cardiovascular: Positive for leg swelling. Negative for chest pain.  Gastrointestinal: Negative for abdominal pain, diarrhea, nausea and vomiting.  Genitourinary: Negative for decreased urine volume, difficulty urinating, dysuria, flank pain and hematuria.  Musculoskeletal: Negative for arthralgias.  Neurological: Positive for headaches. Negative for dizziness, syncope, facial asymmetry, weakness and numbness.     Physical Exam Updated Vital Signs BP (!) 193/124 (BP Location: Right Arm)   Pulse (!) 104   Temp 98.4 F (36.9 C) (Oral)   Resp 16   Ht 5' 5" (1.651 m)   Wt 51.1 kg   LMP 09/11/2020   SpO2 100%   BMI 18.74 kg/m   Physical Exam Constitutional:      General: She is not in acute distress.    Appearance: She is not ill-appearing.     Comments: thin  HENT:     Head: Normocephalic and atraumatic.     Nose: Nose normal.     Mouth/Throat:     Mouth: Mucous membranes are moist.  Eyes:  Extraocular Movements: Extraocular movements intact.     Pupils: Pupils are equal, round, and reactive to light.     Comments: Slight swelling of bilateral lower eyelids Full VF  Cardiovascular:     Rate and Rhythm: Regular rhythm. Tachycardia present.     Pulses: Normal pulses.     Heart sounds: No murmur heard. No friction rub. No gallop.   Pulmonary:     Effort: Pulmonary effort is normal.     Comments: Slightly decreased breath sounds throughout all lung fields Abdominal:     General: Abdomen is flat. There is no distension.     Palpations: Abdomen is soft.     Tenderness: There is no abdominal tenderness. There is no right CVA tenderness, left CVA tenderness, guarding or rebound.  Musculoskeletal:     Cervical back: Neck supple.     Right lower leg: Edema (trace) present.     Left lower leg: Edema (trace) present.  Lymphadenopathy:     Cervical: No cervical adenopathy.  Skin:    General: Skin is warm and dry.     Capillary Refill: Capillary refill takes less than 2 seconds.     Findings: No lesion or rash.  Neurological:     General: No focal deficit present.     Mental Status: She is alert and oriented to person, place, and time.     Cranial Nerves: No cranial nerve deficit.     Sensory: No sensory deficit.     Motor: No weakness.  Psychiatric:        Mood and Affect: Mood normal.        Behavior: Behavior normal.     ED Results / Procedures / Treatments    Labs (all labs ordered are listed, but only abnormal results are displayed) Labs Reviewed  COMPREHENSIVE METABOLIC PANEL - Abnormal; Notable for the following components:      Result Value   Sodium 129 (*)    Potassium 5.7 (*)    Chloride 97 (*)    Glucose, Bld 590 (*)    Creatinine, Ser 1.41 (*)    Calcium 8.4 (*)    Total Protein 5.8 (*)    Albumin 2.0 (*)    GFR, Estimated 51 (*)    All other components within normal limits  CBC WITH DIFFERENTIAL/PLATELET - Abnormal; Notable for the following components:   RBC 3.20 (*)    Hemoglobin 9.6 (*)    HCT 27.9 (*)    All other components within normal limits  URINALYSIS, ROUTINE W REFLEX MICROSCOPIC - Abnormal; Notable for the following components:   APPearance HAZY (*)    Glucose, UA >=500 (*)    Hgb urine dipstick LARGE (*)    Protein, ur >300 (*)    All other components within normal limits  URINALYSIS, MICROSCOPIC (REFLEX) - Abnormal; Notable for the following components:   Bacteria, UA RARE (*)    All other components within normal limits  PROTEIN / CREATININE RATIO, URINE  MICROALBUMIN / CREATININE URINE RATIO    EKG EKG Interpretation  Date/Time:  Friday September 12 2020 20:18:06 EDT Ventricular Rate:  114 PR Interval:  134 QRS Duration: 78 QT Interval:  327 QTC Calculation: 451 R Axis:   106 Text Interpretation: Sinus tachycardia Borderline right axis deviation Otherwise no significant change Confirmed by Floyd, Dan ([id number]) on 09/12/2020 8:32:05 PM   Radiology No results found.  Procedures Procedures   Medications Ordered in ED Medications - No data to display  ED Course    I have reviewed the triage vital signs and the nursing notes.  Pertinent labs & imaging results that were available during my care of the patient were reviewed by me and considered in my medical decision making (see chart for details).    MDM Rules/Calculators/A&P                          Patient is a 30 year old female with past  medical history of poorly controlled type 1 diabetes who presents with severe asymptomatic hypertension and swelling of eyes and legs.  On examination she is neurologically intact, she does have slightly decreased breath sounds throughout her lungs, but denies shortness of breath.  Will obtain chest x-ray to rule out pulmonary edema.  She does not have any chest pain or changes in urination to be concern for cardiac or renal failure from her hypertension.  She did have BMP on 4/13 that showed hyperkalemia to 5.8, pseudohyponatremia to 135 with hyperglycemia to 455.  Her creatinine was around her baseline at 1.47.  Will obtain EKG, CMP, CBC, UA to further assess.  No need for neuroimaging as she is neurologically intact on exam.  Suspicious for nephrotic syndrome, patient does note later in examination that she had diarrheal illness about a week ago.  With her swelling, this could be considered.  Given her lack of symptoms would not attempt to quickly lowered her blood pressure.  CMP with hypoalbuminemia to 2, K elevated at 5.7, glucose elevated to 290, no anion gap.  Cr is stable at 1.41.  EKG with sinus tachycardia, but no other changes.  No peaked T waves. Urine negative for ketones and with >300 protein.    Spoke with on-call nephrologist, Dr. Candiss Norse, who request add on urine albumin/creatinine and protein/creatinine ratio.  Advised can use chlorthalidone if patient appears fluid overloaded.  Recommends follow up in office next week.  Patient mildly fluid overloaded and with hypertension, will start chlorthalidone 3m daily.  K will improve with treatment of hyperglycemia.  No DKA, patient advised to monitor for signs of DKA and follow plan from endocrinologist, check sugars regularly.  Advised patient to monitor for signs of hypertensive emergency including focal weakness, difficulty urinating, flank tenderness, chest pain, difficulty breathing.  She was understanding.  Patient was discharged home in stable  condition.  She will monitor her blood sugars and f/u with nephrology as outpatient.  Final Clinical Impression(s) / ED Diagnoses Final diagnoses:  Nephrotic syndrome  Severe hypertension    Rx / DC Orders ED Discharge Orders         Ordered    chlorthalidone (HYGROTON) 25 MG tablet  Daily,   Status:  Discontinued        09/12/20 2152    chlorthalidone (HYGROTON) 25 MG tablet  Daily        09/12/20 2158           Gracelee Stemmler, BBernita Raisin DO 09/12/20 [place] D[place] DO 09/12/20 2243

## 2020-09-12 NOTE — ED Notes (Signed)
Patient requested PO intake. EDP made aware and stated it can be given despite elevated Blood Glucose level.

## 2020-09-13 LAB — PROTEIN / CREATININE RATIO, URINE
Creatinine, Urine: 34.09 mg/dL
Protein Creatinine Ratio: 11.41 mg/mg{Cre} — ABNORMAL HIGH (ref 0.00–0.15)
Total Protein, Urine: 389 mg/dL

## 2020-09-15 LAB — MICROALBUMIN / CREATININE URINE RATIO
Creatinine, Urine: 24.4 mg/dL
Microalb Creat Ratio: 6845 mg/g creat — ABNORMAL HIGH (ref 0–29)
Microalb, Ur: 1670.1 ug/mL — ABNORMAL HIGH

## 2020-10-17 ENCOUNTER — Other Ambulatory Visit: Payer: Self-pay | Admitting: Nephrology

## 2020-10-17 DIAGNOSIS — N1832 Chronic kidney disease, stage 3b: Secondary | ICD-10-CM

## 2020-10-29 ENCOUNTER — Other Ambulatory Visit: Payer: Self-pay

## 2020-10-29 ENCOUNTER — Encounter (HOSPITAL_BASED_OUTPATIENT_CLINIC_OR_DEPARTMENT_OTHER): Payer: Self-pay | Admitting: *Deleted

## 2020-10-29 ENCOUNTER — Emergency Department (HOSPITAL_BASED_OUTPATIENT_CLINIC_OR_DEPARTMENT_OTHER)
Admission: EM | Admit: 2020-10-29 | Discharge: 2020-10-29 | Disposition: A | Discharge status: Home / Self Care | Payer: Medicaid Other | Attending: Emergency Medicine | Admitting: Emergency Medicine

## 2020-10-29 DIAGNOSIS — F17210 Nicotine dependence, cigarettes, uncomplicated: Secondary | ICD-10-CM | POA: Insufficient documentation

## 2020-10-29 DIAGNOSIS — R739 Hyperglycemia, unspecified: Secondary | ICD-10-CM

## 2020-10-29 DIAGNOSIS — I10 Essential (primary) hypertension: Secondary | ICD-10-CM | POA: Insufficient documentation

## 2020-10-29 DIAGNOSIS — Z794 Long term (current) use of insulin: Secondary | ICD-10-CM | POA: Diagnosis not present

## 2020-10-29 DIAGNOSIS — E875 Hyperkalemia: Secondary | ICD-10-CM | POA: Diagnosis not present

## 2020-10-29 DIAGNOSIS — R000 Tachycardia, unspecified: Secondary | ICD-10-CM | POA: Diagnosis not present

## 2020-10-29 DIAGNOSIS — E1065 Type 1 diabetes mellitus with hyperglycemia: Secondary | ICD-10-CM | POA: Insufficient documentation

## 2020-10-29 DIAGNOSIS — R1013 Epigastric pain: Secondary | ICD-10-CM | POA: Diagnosis not present

## 2020-10-29 DIAGNOSIS — R5383 Other fatigue: Secondary | ICD-10-CM | POA: Diagnosis present

## 2020-10-29 LAB — BASIC METABOLIC PANEL
Anion gap: 6 (ref 5–15)
BUN: 23 mg/dL — ABNORMAL HIGH (ref 6–20)
CO2: 25 mmol/L (ref 22–32)
Calcium: 8.4 mg/dL — ABNORMAL LOW (ref 8.9–10.3)
Chloride: 97 mmol/L — ABNORMAL LOW (ref 98–111)
Creatinine, Ser: 1.5 mg/dL — ABNORMAL HIGH (ref 0.44–1.00)
GFR, Estimated: 47 mL/min — ABNORMAL LOW (ref >60)
Glucose, Bld: 504 mg/dL (ref 70–99)
Potassium: 4.9 mmol/L (ref 3.5–5.1)
Sodium: 128 mmol/L — ABNORMAL LOW (ref 135–145)

## 2020-10-29 LAB — CBC WITH DIFFERENTIAL/PLATELET
Abs Immature Granulocytes: 0.01 10*3/uL (ref 0.00–0.07)
Basophils Absolute: 0 10*3/uL (ref 0.0–0.1)
Basophils Relative: 1 %
Eosinophils Absolute: 0.1 10*3/uL (ref 0.0–0.5)
Eosinophils Relative: 2 %
HCT: 28 % — ABNORMAL LOW (ref 36.0–46.0)
Hemoglobin: 9.5 g/dL — ABNORMAL LOW (ref 12.0–15.0)
Immature Granulocytes: 0 %
Lymphocytes Relative: 32 %
Lymphs Abs: 1.6 10*3/uL (ref 0.7–4.0)
MCH: 29.1 pg (ref 26.0–34.0)
MCHC: 33.9 g/dL (ref 30.0–36.0)
MCV: 85.9 fL (ref 80.0–100.0)
Monocytes Absolute: 0.2 10*3/uL (ref 0.1–1.0)
Monocytes Relative: 5 %
Neutro Abs: 3 10*3/uL (ref 1.7–7.7)
Neutrophils Relative %: 60 %
Platelets: 391 10*3/uL (ref 150–400)
RBC: 3.26 MIL/uL — ABNORMAL LOW (ref 3.87–5.11)
RDW: 12.7 % (ref 11.5–15.5)
WBC: 5 10*3/uL (ref 4.0–10.5)
nRBC: 0 % (ref 0.0–0.2)

## 2020-10-29 LAB — CBG MONITORING, ED: Glucose-Capillary: 357 mg/dL — ABNORMAL HIGH (ref 70–99)

## 2020-10-29 MED ORDER — CHLORTHALIDONE 25 MG PO TABS: 25.0000 mg | ORAL_TABLET | Freq: Every day | ORAL | 0 refills | Status: DC

## 2020-10-29 MED ORDER — CHLORTHALIDONE 25 MG PO TABS: 25.0000 mg | ORAL_TABLET | Freq: Every day | ORAL | Status: DC

## 2020-10-29 MED ORDER — LACTATED RINGERS IV BOLUS
1000.0000 mL | Freq: Once | INTRAVENOUS | Status: AC
  Administered 2020-10-29: 1000 mL via INTRAVENOUS

## 2020-10-29 MED ORDER — METOPROLOL TARTRATE 5 MG/5ML IV SOLN
5.0000 mg | Freq: Once | INTRAVENOUS | Status: AC
  Administered 2020-10-29: 5 mg via INTRAVENOUS
  Filled 2020-10-29: qty 5

## 2020-10-29 NOTE — ED Notes (Signed)
Patient had food delivered to the room.  Provider made aware.  Request the patient hold off on eating for now.  Verbalized understanding.

## 2020-10-29 NOTE — ED Notes (Signed)
Patient refusing CBG. States she knows what her blood sugar is and doesn't need it checked.

## 2020-10-29 NOTE — ED Notes (Signed)
Pt did not want a CBG checked and RN Nira Conn was informed.

## 2020-10-29 NOTE — ED Provider Notes (Signed)
[facility] EMERGENCY DEPARTMENT Provider Note   CSN: [id number] Arrival date & time: 10/29/20  1549     History Chief Complaint  Patient presents with  . abnormal K    Ann Robertson is a 30 y.o. female a history of diabetes melitis (insulin-dependent ).  Presents with a chief complaint of hyperkalemia.  Patient reports that she was seen by her primary care provider yesterday and had blood drawn.  Patient was called today with results that showed potassium was 7.0 and stated "results repeated and confirmed."  Patient was sent to the emergency department to primary care provider for repeat testing.  Patient endorses is generalized fatigue.  Patient states that she has been having increased heart rate for multiple months.  Patient also reports that she has had epigastric abdominal pain for "multiple months."  Patient has had no change in her abdominal pain today.  Patient denies any fevers, chills, chest pain, shortness of breath, nausea, vomiting, diarrhea, facial asymmetry, slurred speech, numbness to extremities, weakness to extremities, tremors, dysuria, hematuria, vaginal pain, vaginal bleeding, vaginal discharge.  Denies illicit drug use or alcohol use.  Patient reports that she uses Tylenol frequently.  Patient denies any frequent ibuprofen use.  The history is provided by the patient.       Past Medical History:  Diagnosis Date  . Diabetes mellitus   . High cholesterol   . HSV-2 infection   . Juvenile diabetes 2000  . Juvenile diabetes mellitus   . Neuropathy   . Preeclampsia   . STD (sexually transmitted disease)     Patient Active Problem List   Diagnosis Date Noted  . Nausea 06/10/2020  . Foot pain, right 04/05/2018  . Postpartum care following cesarean delivery 11/21/2015  . Preterm labor 11/16/2015  . Noncompliance with diabetes treatment 11/15/2015  . Traumatic injury during pregnancy in third trimester 11/14/2015  . Maternal chronic hypertension  11/06/2015  . Previous cesarean section complicating pregnancy 81/44/8185  . Short interval between pregnancies complicating pregnancy, antepartum 07/14/2015  . History of preterm delivery, currently pregnant 07/14/2015  . History of pre-eclampsia in prior pregnancy, currently pregnant 07/14/2015  . Supervision of high risk pregnancy, antepartum 07/14/2015  . History of preterm delivery 09/06/2014  . History of polyhydramnios 09/06/2014  . 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 CESAREAN SECTION    . CESAREAN SECTION N/A 07/05/2013   Procedure: CESAREAN SECTION;  Surgeon: Emily Filbert, MD;  Location: [facility];  Service: Obstetrics;  Laterality: N/A;  . CESAREAN SECTION N/A 07/25/2014   Procedure: CESAREAN SECTION;  Surgeon: Donnamae Jude, MD;  Location: [facility];  Service: Obstetrics;  Laterality: N/A;  . CESAREAN SECTION N/A 11/21/2015   Procedure: CESAREAN SECTION;  Surgeon: Donnamae Jude, MD;  Location: [facility];  Service: Obstetrics;  Laterality: N/A;  . WISDOM TOOTH EXTRACTION       OB History    Gravida  5   Para  4   Term  0   Preterm  4   AB  1   Living  4     SAB  1   IAB  0   Ectopic  0   Multiple  0   Live Births  4           Family History  Problem Relation Age of Onset  .  Stroke Mother   . Hypertension Mother   . Heart disease Mother   . Kidney disease Mother   . Hyperlipidemia Father   . Diabetes Father     Social History   Tobacco Use  . Smoking status: Current Every Day Smoker    Packs/day: 0.25    Years: 9.00    Pack years: 2.25    Types: Cigarettes  . Smokeless tobacco: Never Used  Substance Use Topics  . Alcohol use: Yes    Comment: occasional  . Drug use: No    Home Medications Prior to Admission medications   Medication Sig Start Date End Date Taking?  Authorizing Provider  Blood Glucose Monitoring Suppl (ACCU-CHEK AVIVA PLUS) w/Device KIT Use to check sugar at least 4 times daily. 05/28/20   Renato Shin, MD  chlorthalidone (HYGROTON) 25 MG tablet Take 1 tablet (25 mg total) by mouth daily. 09/12/20   Meccariello, Bernita Raisin, DO  fluconazole (DIFLUCAN) 100 MG tablet Take 1 tablet (100 mg total) by mouth daily. 11/19/19   Renato Shin, MD  glucose blood (ACCU-CHEK GUIDE) test strip Use as instructed 08/11/20   Renato Shin, MD  insulin glargine (LANTUS SOLOSTAR) 100 UNIT/ML Solostar Pen Inject 12 Units into the skin every morning. And pen needles 1/day 09/10/20   Renato Shin, MD  Insulin Pen Needle (PEN NEEDLES) 30G X 8 MM MISC 1 each by Does not apply route daily. E11.9 05/14/19   Renato Shin, MD  ondansetron (ZOFRAN ODT) 4 MG disintegrating tablet Take 1 tablet (4 mg total) by mouth every 8 (eight) hours as needed for nausea or vomiting. 02/18/19   Carlisle Cater, PA-C  ondansetron (ZOFRAN) 4 MG tablet Take 1 tablet (4 mg total) by mouth every 8 (eight) hours as needed for nausea or vomiting. 06/10/20   Renato Shin, MD    Allergies    Hydromorphone hcl  Review of Systems   Review of Systems  Constitutional: Positive for fatigue. Negative for chills and fever.  Eyes: Negative for visual disturbance.  Respiratory: Negative for shortness of breath.   Cardiovascular: Negative for chest pain.  Gastrointestinal: Positive for abdominal pain. Negative for diarrhea, nausea and vomiting.  Genitourinary: Negative for difficulty urinating, dysuria, frequency, vaginal bleeding, vaginal discharge and vaginal pain.  Musculoskeletal: Negative for back pain and neck pain.  Skin: Negative for color change and rash.  Neurological: Negative for dizziness, tremors, seizures, syncope, facial asymmetry, speech difficulty, weakness, light-headedness, numbness and headaches.  Psychiatric/Behavioral: Negative for confusion.    Physical Exam Updated Vital  Signs BP (!) 151/113   Pulse (!) 114   Temp 98.4 F (36.9 C)   Resp 18   Ht 5' 4"  (1.626 m)   Wt 47.6 kg   LMP 10/12/2020   SpO2 100%   BMI 18.02 kg/m   Physical Exam Vitals and nursing note reviewed.  Constitutional:      General: She is not in acute distress.    Appearance: She is not ill-appearing, toxic-appearing or diaphoretic.  HENT:     Head: Normocephalic.  Eyes:     General: No scleral icterus.       Right eye: No discharge.        Left eye: No discharge.  Cardiovascular:     Rate and Rhythm: Tachycardia present.     Heart sounds: Normal heart sounds.     Comments: Tachycardic at rate of 114 Pulmonary:     Effort: Pulmonary effort is normal. No respiratory distress.  Breath sounds: Normal breath sounds. No stridor.  Abdominal:     General: Abdomen is flat. There is no distension. There are no signs of injury.     Palpations: Abdomen is soft. There is no mass or pulsatile mass.     Tenderness: There is no abdominal tenderness. There is no guarding or rebound. Negative signs include McBurney's sign.     Hernia: There is no hernia in the umbilical area or ventral area.     Comments: Mild tenderness to epigastric region  Musculoskeletal:     Cervical back: Neck supple.  Skin:    General: Skin is warm and dry.  Neurological:     General: No focal deficit present.     Mental Status: She is alert.     GCS: GCS eye subscore is 4. GCS verbal subscore is 5. GCS motor subscore is 6.  Psychiatric:        Behavior: Behavior is cooperative.     ED Results / Procedures / Treatments   Labs (all labs ordered are listed, but only abnormal results are displayed) Labs Reviewed  BASIC METABOLIC PANEL - Abnormal; Notable for the following components:      Result Value   Sodium 128 (*)    Chloride 97 (*)    Glucose, Bld 504 (*)    BUN 23 (*)    Creatinine, Ser 1.50 (*)    Calcium 8.4 (*)    GFR, Estimated 47 (*)    All other components within normal limits  CBC  WITH DIFFERENTIAL/PLATELET - Abnormal; Notable for the following components:   RBC 3.26 (*)    Hemoglobin 9.5 (*)    HCT 28.0 (*)    All other components within normal limits  CBG MONITORING, ED - Abnormal; Notable for the following components:   Glucose-Capillary 357 (*)    All other components within normal limits  CBG MONITORING, ED    EKG EKG Interpretation  Date/Time:  Wednesday October 29 2020 16:06:11 EDT Ventricular Rate:  108 PR Interval:  117 QRS Duration: 83 QT Interval:  327 QTC Calculation: 439 R Axis:   95 Text Interpretation: Sinus tachycardia Borderline right axis deviation No significant change since last tracing Confirmed by Isla Pence [phone]) on 10/29/2020 4:12:20 PM   Radiology No results found.  Procedures Procedures   Medications Ordered in ED Medications  chlorthalidone (HYGROTON) tablet 25 mg (25 mg Oral Not Given 10/29/20 1849)  lactated ringers bolus 1,000 mL (0 mLs Intravenous Stopped 10/29/20 1842)  metoprolol tartrate (LOPRESSOR) injection 5 mg (5 mg Intravenous Given 10/29/20 1840)    ED Course  I have reviewed the triage vital signs and the nursing notes.  Pertinent labs & imaging results that were available during my care of the patient were reviewed by me and considered in my medical decision making (see chart for details).    MDM Rules/Calculators/A&P                          Alert 30 year old female no acute distress, nontoxic-appearing.  Patient presents with chief complaint of abnormal lab work.  Patient had lab work performed yesterday and was contacted by her primary care provider today that potassium was elevated at 7.  Patient was advised to the emergency department for repeat testing.  Will obtain CBC, BMP, EKG and POC CBG.  EKG shows tachycardia without any peaked T waves.  CBC shows anemia with hemoglobin at 9.5 and hematocrit at 28.0 no  change from previous results which were obtained about prior which showed hemoglobin 9.6  and hematocrit at 28.  No obvious source of bleeding.  We will have patient follow-up with her primary care provider for repeat testing and management.    Patient refuses POC CBG.  Informed by RN that patient had fluid ordered to her room.  Advised nurse to have patient refrain from eating until lab results are obtained.  BMP shows potassium within normal range at 4.9.  Patient's abnormal potassium obtained yesterday likely due to hemolyzed sample.  Glucose elevated at 504.  Patient appears to have elevated blood pressure sugar at baseline.  She has decision-making with patient about hyperglycemia control in emergency department.  Patient is amenable to IV fluids however refuses insulin stating that her primary care provider is doing a good job of keeping her sugars controlled and that she does not want to have any change or added influence to that regimen.  We will give patient 1 L fluid bolus of LR and reassess blood sugar.  Patient noted to have increased BUN and creatinine.  Patient appears to be at her baseline. Patient noted to have hyponatremia and hypochloremia.  Patient appears to be at her baseline.  During hospital stay patient noted to become more hypertensive.  Patient reports that she takes chlorthalidone however has not taken this medication over the last month due to running out of the prescription.  Patient asymptomatic with his hypertension.  Attempted to give patient home dose of this medication in emergency department however was not available on formulary.  Patient received 5 mg labetalol with improvement in her blood pressure.  We will give patient 1 month refill of her medication.  Patient advised to follow-up with primary care provider for further management.  Patient discharged with instructions to follow-up with primary care provider.  Importance of maintaining proper diet and insulin regiment stressed to patient.  Patient given strict return precautions.  Patient expresses  understanding of all instructions and is agreeable with this plan.   Final Clinical Impression(s) / ED Diagnoses Final diagnoses:  Hyperglycemia  Hypertension, unspecified type    Rx / DC Orders ED Discharge Orders         Ordered    chlorthalidone (HYGROTON) 25 MG tablet  Daily        10/29/20 1914           Dyann Ruddle 10/29/20 2149    Isla Pence, MD 10/29/20 2312

## 2020-10-29 NOTE — Discharge Instructions (Signed)
You came to the emergency department today to be evaluated for possible high potassium.  On repeat examination your potassium was within normal limits.  You were found to have a high blood sugar and high blood pressure.  You received fluids in the emergency department which help bring your blood sugar back under control.  Please take your insulin as prescribed.  You see if medication to help reduce your blood pressure.  I have given you prescription of the medication you were previously taking.  Please take this as prescribed.  Is very important that you follow-up with your primary care provider about your high blood pressure and your hyperglycemia.  Please schedule appointment with them as soon as possible.  Get help right away if: Your blood glucose monitor reads "high" even when you are taking insulin. You have trouble breathing. You have a change in how you think, feel, or act (mental status). You have nausea or vomiting that does not go away Develop a severe headache or confusion. Have unusual weakness or numbness. Feel faint. Have severe pain in your chest or abdomen. Vomit repeatedly.

## 2020-10-29 NOTE — ED Triage Notes (Signed)
Sent here for K+ 7, also c/o palpitations

## 2020-10-30 ENCOUNTER — Other Ambulatory Visit: Payer: Medicaid Other

## 2020-10-30 ENCOUNTER — Observation Stay (HOSPITAL_COMMUNITY)
Admission: EM | Admit: 2020-10-30 | Discharge: 2020-10-31 | Disposition: A | Discharge status: Home / Self Care | Payer: Medicaid Other | Attending: Family Medicine | Admitting: Family Medicine

## 2020-10-30 ENCOUNTER — Inpatient Hospital Stay: Admission: RE | Admit: 2020-10-30 | Payer: Medicaid Other | Source: Ambulatory Visit

## 2020-10-30 ENCOUNTER — Encounter (HOSPITAL_COMMUNITY): Payer: Self-pay | Admitting: Emergency Medicine

## 2020-10-30 ENCOUNTER — Other Ambulatory Visit: Payer: Self-pay

## 2020-10-30 DIAGNOSIS — Z794 Long term (current) use of insulin: Secondary | ICD-10-CM | POA: Insufficient documentation

## 2020-10-30 DIAGNOSIS — N1831 Chronic kidney disease, stage 3a: Secondary | ICD-10-CM | POA: Insufficient documentation

## 2020-10-30 DIAGNOSIS — E1065 Type 1 diabetes mellitus with hyperglycemia: Secondary | ICD-10-CM | POA: Diagnosis not present

## 2020-10-30 DIAGNOSIS — F17210 Nicotine dependence, cigarettes, uncomplicated: Secondary | ICD-10-CM | POA: Diagnosis not present

## 2020-10-30 DIAGNOSIS — I129 Hypertensive chronic kidney disease with stage 1 through stage 4 chronic kidney disease, or unspecified chronic kidney disease: Secondary | ICD-10-CM | POA: Diagnosis not present

## 2020-10-30 DIAGNOSIS — R1033 Periumbilical pain: Principal | ICD-10-CM

## 2020-10-30 DIAGNOSIS — R1013 Epigastric pain: Secondary | ICD-10-CM | POA: Diagnosis present

## 2020-10-30 DIAGNOSIS — Z79899 Other long term (current) drug therapy: Secondary | ICD-10-CM | POA: Insufficient documentation

## 2020-10-30 DIAGNOSIS — R739 Hyperglycemia, unspecified: Secondary | ICD-10-CM

## 2020-10-30 DIAGNOSIS — Z20822 Contact with and (suspected) exposure to covid-19: Secondary | ICD-10-CM | POA: Insufficient documentation

## 2020-10-30 DIAGNOSIS — R109 Unspecified abdominal pain: Secondary | ICD-10-CM | POA: Diagnosis present

## 2020-10-30 LAB — COMPREHENSIVE METABOLIC PANEL
ALT: 11 U/L (ref 0–44)
AST: 15 U/L (ref 15–41)
Albumin: 2.5 g/dL — ABNORMAL LOW (ref 3.5–5.0)
Alkaline Phosphatase: 66 U/L (ref 38–126)
Anion gap: 15 (ref 5–15)
BUN: 21 mg/dL — ABNORMAL HIGH (ref 6–20)
CO2: 20 mmol/L — ABNORMAL LOW (ref 22–32)
Calcium: 8.8 mg/dL — ABNORMAL LOW (ref 8.9–10.3)
Chloride: 93 mmol/L — ABNORMAL LOW (ref 98–111)
Creatinine, Ser: 1.7 mg/dL — ABNORMAL HIGH (ref 0.44–1.00)
GFR, Estimated: 41 mL/min — ABNORMAL LOW (ref >60)
Glucose, Bld: 548 mg/dL (ref 70–99)
Potassium: 4.8 mmol/L (ref 3.5–5.1)
Sodium: 128 mmol/L — ABNORMAL LOW (ref 135–145)
Total Bilirubin: 1.5 mg/dL — ABNORMAL HIGH (ref 0.3–1.2)
Total Protein: 6.1 g/dL — ABNORMAL LOW (ref 6.5–8.1)

## 2020-10-30 LAB — URINALYSIS, ROUTINE W REFLEX MICROSCOPIC
Bilirubin Urine: NEGATIVE
Glucose, UA: >=500 mg/dL — AB
Ketones, ur: 20 mg/dL — AB
Leukocytes,Ua: NEGATIVE
Nitrite: NEGATIVE
Protein, ur: >=300 mg/dL — AB
Specific Gravity, Urine: 1.017 (ref 1.005–1.030)
pH: 5 (ref 5.0–8.0)

## 2020-10-30 LAB — CBC WITH DIFFERENTIAL/PLATELET
Abs Immature Granulocytes: 0.02 10*3/uL (ref 0.00–0.07)
Basophils Absolute: 0 10*3/uL (ref 0.0–0.1)
Basophils Relative: 1 %
Eosinophils Absolute: 0.1 10*3/uL (ref 0.0–0.5)
Eosinophils Relative: 1 %
HCT: 30.2 % — ABNORMAL LOW (ref 36.0–46.0)
Hemoglobin: 10.2 g/dL — ABNORMAL LOW (ref 12.0–15.0)
Immature Granulocytes: 0 %
Lymphocytes Relative: 33 %
Lymphs Abs: 1.8 10*3/uL (ref 0.7–4.0)
MCH: 29.1 pg (ref 26.0–34.0)
MCHC: 33.8 g/dL (ref 30.0–36.0)
MCV: 86 fL (ref 80.0–100.0)
Monocytes Absolute: 0.3 10*3/uL (ref 0.1–1.0)
Monocytes Relative: 5 %
Neutro Abs: 3.3 10*3/uL (ref 1.7–7.7)
Neutrophils Relative %: 60 %
Platelets: 373 10*3/uL (ref 150–400)
RBC: 3.51 MIL/uL — ABNORMAL LOW (ref 3.87–5.11)
RDW: 12.6 % (ref 11.5–15.5)
WBC: 5.4 10*3/uL (ref 4.0–10.5)
nRBC: 0 % (ref 0.0–0.2)

## 2020-10-30 LAB — I-STAT VENOUS BLOOD GAS, ED
Acid-base deficit: 3 mmol/L — ABNORMAL HIGH (ref 0.0–2.0)
Bicarbonate: 22.4 mmol/L (ref 20.0–28.0)
Calcium, Ion: 1.12 mmol/L — ABNORMAL LOW (ref 1.15–1.40)
HCT: 28 % — ABNORMAL LOW (ref 36.0–46.0)
Hemoglobin: 9.5 g/dL — ABNORMAL LOW (ref 12.0–15.0)
O2 Saturation: 29 %
Potassium: 5.5 mmol/L — ABNORMAL HIGH (ref 3.5–5.1)
Sodium: 128 mmol/L — ABNORMAL LOW (ref 135–145)
TCO2: 24 mmol/L (ref 22–32)
pCO2, Ven: 42 mmHg — ABNORMAL LOW (ref 44.0–60.0)
pH, Ven: 7.334 (ref 7.250–7.430)
pO2, Ven: 20 mmHg — CL (ref 32.0–45.0)

## 2020-10-30 LAB — RESP PANEL BY RT-PCR (FLU A&B, COVID) ARPGX2
Influenza A by PCR: NEGATIVE
Influenza B by PCR: NEGATIVE
SARS Coronavirus 2 by RT PCR: NEGATIVE

## 2020-10-30 LAB — CBG MONITORING, ED: Glucose-Capillary: 471 mg/dL — ABNORMAL HIGH (ref 70–99)

## 2020-10-30 LAB — LIPASE, BLOOD: Lipase: 26 U/L (ref 11–51)

## 2020-10-30 MED ORDER — ONDANSETRON 4 MG PO TBDP
4.0000 mg | ORAL_TABLET | Freq: Three times a day (TID) | ORAL | Status: DC | PRN
  Administered 2020-10-31: 4 mg via ORAL
  Filled 2020-10-30: qty 1

## 2020-10-30 MED ORDER — ENOXAPARIN SODIUM 30 MG/0.3ML IJ SOSY: 30.0000 mg | PREFILLED_SYRINGE | INTRAMUSCULAR | Status: DC

## 2020-10-30 MED ORDER — ACETAMINOPHEN 650 MG RE SUPP: 650.0000 mg | Freq: Four times a day (QID) | RECTAL | Status: DC | PRN

## 2020-10-30 MED ORDER — INSULIN ASPART 100 UNIT/ML IJ SOLN: 0.0000 [IU] | Freq: Three times a day (TID) | INTRAMUSCULAR | Status: DC

## 2020-10-30 MED ORDER — INSULIN GLARGINE 100 UNIT/ML ~~LOC~~ SOLN
7.0000 [IU] | Freq: Every day | SUBCUTANEOUS | Status: DC
  Filled 2020-10-30 (×2): qty 0.07

## 2020-10-30 MED ORDER — MORPHINE SULFATE (PF) 2 MG/ML IV SOLN
2.0000 mg | INTRAVENOUS | Status: DC | PRN
  Administered 2020-10-30 – 2020-10-31 (×2): 2 mg via INTRAVENOUS
  Filled 2020-10-30 (×2): qty 1

## 2020-10-30 MED ORDER — LACTATED RINGERS IV BOLUS: 1000.0000 mL | Freq: Once | INTRAVENOUS | Status: DC

## 2020-10-30 MED ORDER — MORPHINE SULFATE (PF) 4 MG/ML IV SOLN
4.0000 mg | Freq: Once | INTRAVENOUS | Status: AC
  Administered 2020-10-30: 4 mg via INTRAVENOUS
  Filled 2020-10-30: qty 1

## 2020-10-30 MED ORDER — NICOTINE 14 MG/24HR TD PT24
14.0000 mg | MEDICATED_PATCH | Freq: Every day | TRANSDERMAL | Status: DC
  Administered 2020-10-31 (×2): 14 mg via TRANSDERMAL
  Filled 2020-10-30 (×2): qty 1

## 2020-10-30 MED ORDER — ONDANSETRON HCL 4 MG/2ML IJ SOLN
4.0000 mg | Freq: Once | INTRAMUSCULAR | Status: AC
  Administered 2020-10-30: 4 mg via INTRAVENOUS
  Filled 2020-10-30: qty 2

## 2020-10-30 MED ORDER — GABAPENTIN 300 MG PO CAPS
300.0000 mg | ORAL_CAPSULE | Freq: Three times a day (TID) | ORAL | Status: DC
  Administered 2020-10-31 (×3): 300 mg via ORAL
  Filled 2020-10-30 (×5): qty 1

## 2020-10-30 MED ORDER — LACTATED RINGERS IV SOLN: INTRAVENOUS | Status: DC

## 2020-10-30 MED ORDER — ACETAMINOPHEN 325 MG PO TABS
650.0000 mg | ORAL_TABLET | Freq: Four times a day (QID) | ORAL | Status: DC | PRN
  Administered 2020-10-31: 650 mg via ORAL
  Filled 2020-10-30: qty 2

## 2020-10-30 MED ORDER — LACTATED RINGERS IV BOLUS
1000.0000 mL | Freq: Once | INTRAVENOUS | Status: AC
  Administered 2020-10-30: 1000 mL via INTRAVENOUS

## 2020-10-30 MED ORDER — INSULIN ASPART 100 UNIT/ML IJ SOLN
0.0000 [IU] | Freq: Three times a day (TID) | INTRAMUSCULAR | Status: DC
  Administered 2020-10-31 (×2): 3 [IU] via SUBCUTANEOUS

## 2020-10-30 MED ORDER — CHLORTHALIDONE 25 MG PO TABS
25.0000 mg | ORAL_TABLET | Freq: Every day | ORAL | Status: DC
  Administered 2020-10-31 (×2): 25 mg via ORAL
  Filled 2020-10-30 (×4): qty 1

## 2020-10-30 NOTE — ED Provider Notes (Signed)
[facility] EMERGENCY DEPARTMENT Provider Note   CSN: [id number] Arrival date & time: 10/30/20  1451     History Chief Complaint  Patient presents with  . Abdominal Pain    Ann Robertson is a 30 y.o. female.  Patient is a 30 year old female with a history of type 1 diabetes, CKD stage II and hypertension who is presenting today due to ongoing abdominal pain.  Patient reports that approximately 1 to 2 months ago she started having intermittent abdominal pain which she described in her epigastric region which would come and go but she never sought care for it until today when she woke up and had abdominal pain.  She reports the pain is grabbing is severe and cramping and in the middle of her abdomen but moves throughout her abdomen.  She has been nauseated several times today but has not vomited.  She has had no diarrhea or constipation.  Her abdomen has felt hot but she has no documented fever.  She went to her PCP at [facility] today and they sent her for a CAT scan.  The CAT scan which was done through the [facility] system without contrast reported that her appendix was equivocal at 6 mm with some mild surrounding stranding but no definitive appendicitis.  Patient denies any urinary symptoms, vaginal discharge and has had regular menses.  She denies any cough, congestion, chest pain or shortness of breath.  She has not taken anything for the pain.  The pain was there when she woke up this morning and it has not seemed to be worse when she had an ice pop.  She has not checked her sugar today.  The history is provided by the patient.  Abdominal Pain Pain location:  Epigastric and periumbilical Pain quality: aching, cramping and gnawing   Pain radiates to:  Does not radiate Pain severity:  Moderate Onset quality:  Gradual Duration:  1 day Timing:  Constant      Past Medical History:  Diagnosis Date  . Diabetes mellitus   . High cholesterol   . HSV-2 infection    . Juvenile diabetes 2000  . Juvenile diabetes mellitus   . Neuropathy   . Preeclampsia   . STD (sexually transmitted disease)     Patient Active Problem List   Diagnosis Date Noted  . Nausea 06/10/2020  . Foot pain, right 04/05/2018  . Postpartum care following cesarean delivery 11/21/2015  . Preterm labor 11/16/2015  . Noncompliance with diabetes treatment 11/15/2015  . Traumatic injury during pregnancy in third trimester 11/14/2015  . Maternal chronic hypertension 11/06/2015  . Previous cesarean section complicating pregnancy 95/63/8756  . Short interval between pregnancies complicating pregnancy, antepartum 07/14/2015  . History of preterm delivery, currently pregnant 07/14/2015  . History of pre-eclampsia in prior pregnancy, currently pregnant 07/14/2015  . Supervision of high risk pregnancy, antepartum 07/14/2015  . History of preterm delivery 09/06/2014  . History of polyhydramnios 09/06/2014  . 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[place]) 12/06/2013    Past Surgical History:  Procedure Laterality Date  . CESAREAN SECTION    . CESAREAN SECTION N/A 07/05/2013   Procedure: CESAREAN SECTION;  Surgeon: Emily Filbert, MD;  Location: [facility];  Service: Obstetrics;  Laterality: N/A;  . CESAREAN SECTION N/A 07/25/2014   Procedure: CESAREAN SECTION;  Surgeon: Donnamae Jude, MD;  Location: [facility];  Service: Obstetrics;  Laterality: N/A;  . CESAREAN  SECTION N/A 11/21/2015   Procedure: CESAREAN SECTION;  Surgeon: Donnamae Jude, MD;  Location: [facility];  Service: Obstetrics;  Laterality: N/A;  . WISDOM TOOTH EXTRACTION       OB History    Gravida  5   Para  4   Term  0   Preterm  4   AB  1   Living  4     SAB  1   IAB  0   Ectopic  0   Multiple  0   Live Births  4           Family History  Problem Relation Age of Onset  . Stroke Mother   . Hypertension Mother    . Heart disease Mother   . Kidney disease Mother   . Hyperlipidemia Father   . Diabetes Father     Social History   Tobacco Use  . Smoking status: Current Every Day Smoker    Packs/day: 0.25    Years: 9.00    Pack years: 2.25    Types: Cigarettes  . Smokeless tobacco: Never Used  Substance Use Topics  . Alcohol use: Yes    Comment: occasional  . Drug use: No    Home Medications Prior to Admission medications   Medication Sig Start Date End Date Taking? Authorizing Provider  Blood Glucose Monitoring Suppl (ACCU-CHEK AVIVA PLUS) w/Device KIT Use to check sugar at least 4 times daily. 05/28/20   Renato Shin, MD  chlorthalidone (HYGROTON) 25 MG tablet Take 1 tablet (25 mg total) by mouth daily. 10/29/20   Loni Beckwith, PA-C  fluconazole (DIFLUCAN) 100 MG tablet Take 1 tablet (100 mg total) by mouth daily. 11/19/19   Renato Shin, MD  glucose blood (ACCU-CHEK GUIDE) test strip Use as instructed 08/11/20   Renato Shin, MD  insulin glargine (LANTUS SOLOSTAR) 100 UNIT/ML Solostar Pen Inject 12 Units into the skin every morning. And pen needles 1/day 09/10/20   Renato Shin, MD  Insulin Pen Needle (PEN NEEDLES) 30G X 8 MM MISC 1 each by Does not apply route daily. E11.9 05/14/19   Renato Shin, MD  ondansetron (ZOFRAN ODT) 4 MG disintegrating tablet Take 1 tablet (4 mg total) by mouth every 8 (eight) hours as needed for nausea or vomiting. 02/18/19   Carlisle Cater, PA-C  ondansetron (ZOFRAN) 4 MG tablet Take 1 tablet (4 mg total) by mouth every 8 (eight) hours as needed for nausea or vomiting. 06/10/20   Renato Shin, MD    Allergies    Hydromorphone hcl  Review of Systems   Review of Systems  Gastrointestinal: Positive for abdominal pain.  All other systems reviewed and are negative.   Physical Exam Updated Vital Signs BP (!) 149/104   Pulse (!) 111   Temp 98.6 F (37 C)   Resp (!) 25   Ht $R'5\' 5"'pU$  (1.651 m)   Wt 47.6 kg   LMP 10/12/2020   SpO2 100%   BMI 17.47  kg/m   Physical Exam Vitals and nursing note reviewed.  Constitutional:      General: She is in acute distress.     Appearance: She is well-developed.     Comments: Appears uncomfortable  HENT:     Head: Normocephalic and atraumatic.     Mouth/Throat:     Mouth: Mucous membranes are dry.  Eyes:     Pupils: Pupils are equal, round, and reactive to light.  Cardiovascular:     Rate and Rhythm:  Regular rhythm. Tachycardia present.     Heart sounds: Normal heart sounds. No murmur heard. No friction rub.  Pulmonary:     Effort: Pulmonary effort is normal.     Breath sounds: Normal breath sounds. No wheezing or rales.  Abdominal:     General: Abdomen is flat. Bowel sounds are normal. There is no distension.     Palpations: Abdomen is soft.     Tenderness: There is abdominal tenderness in the epigastric area and periumbilical area. There is guarding. There is no right CVA tenderness, left CVA tenderness or rebound. Negative signs include Murphy's sign.  Musculoskeletal:        General: No tenderness. Normal range of motion.     Right lower leg: No edema.     Left lower leg: No edema.     Comments: No edema  Skin:    General: Skin is warm and dry.     Findings: No rash.  Neurological:     Mental Status: She is alert and oriented to person, place, and time. Mental status is at baseline.     Cranial Nerves: No cranial nerve deficit.  Psychiatric:        Mood and Affect: Mood normal.        Behavior: Behavior normal.      ED Results / Procedures / Treatments   Labs (all labs ordered are listed, but only abnormal results are displayed) Labs Reviewed  CBC WITH DIFFERENTIAL/PLATELET - Abnormal; Notable for the following components:      Result Value   RBC 3.51 (*)    Hemoglobin 10.2 (*)    HCT 30.2 (*)    All other components within normal limits  COMPREHENSIVE METABOLIC PANEL - Abnormal; Notable for the following components:   Sodium 128 (*)    Chloride 93 (*)    CO2 20 (*)     Glucose, Bld 548 (*)    BUN 21 (*)    Creatinine, Ser 1.70 (*)    Calcium 8.8 (*)    Total Protein 6.1 (*)    Albumin 2.5 (*)    Total Bilirubin 1.5 (*)    GFR, Estimated 41 (*)    All other components within normal limits  RESP PANEL BY RT-PCR (FLU A&B, COVID) ARPGX2  LIPASE, BLOOD  I-STAT VENOUS BLOOD GAS, ED    EKG None  Radiology No results found.  Procedures Procedures   Medications Ordered in ED Medications  morphine 4 MG/ML injection 4 mg (4 mg Intravenous Given 10/30/20 1654)  ondansetron (ZOFRAN) injection 4 mg (4 mg Intravenous Given 10/30/20 1654)  lactated ringers bolus 1,000 mL (1,000 mLs Intravenous New Bag/Given 10/30/20 1654)    ED Course  I have reviewed the triage vital signs and the nursing notes.  Pertinent labs & imaging results that were available during my care of the patient were reviewed by me and considered in my medical decision making (see chart for details).    MDM Rules/Calculators/A&P                          30 year old female presenting today with abdominal pain that is been present all day today.  She had a CT of the abdomen pelvis at an outside facility that was equivocal for appendicitis and was sent here for further evaluation.  Patient has not had anything to eat today has not taken her medications.  Blood sugar today is 548 with bump in creatinine to 1.7  from baseline of 1.4.  Total bilirubin is mildly elevated at 1.5 but LFTs are within normal limits.  Anion gap is 15.  CBC with a normal white count and COVID is negative.  Findings discussed with Dr. Harlow Asa who came and evaluated the patient and at this time feels that the patient needs further evaluation and repeat abdominal exams.  At this time he does not feel that this is clear-cut appendicitis.  She needs to be optimized with improved blood sugar, ongoing pain control, repeat abdominal exams and repeat CBC in the morning.  If she is still having significant pain they may repeat her  scan.  He did not want to give antibiotics at this time until there is more evidence that this could be appendicitis.  Patient given IV fluids and pain control.  VBG is pending.  Will admit for further care.  MDM Number of Diagnoses or Management Options   Amount and/or Complexity of Data Reviewed Clinical lab tests: ordered and reviewed Tests in the radiology section of CPT: reviewed Independent visualization of images, tracings, or specimens: yes  Risk of Complications, Morbidity, and/or Mortality Presenting problems: moderate Diagnostic procedures: moderate Management options: moderate  Patient Progress Patient progress: stable  Final Clinical Impression(s) / ED Diagnoses Final diagnoses:  Periumbilical abdominal pain  Hyperglycemia    Rx / DC Orders ED Discharge Orders    None       Blanchie Dessert, MD 10/30/20 1728

## 2020-10-30 NOTE — ED Provider Notes (Signed)
Emergency Medicine Provider Triage Evaluation Note  Ann Robertson , a 30 y.o. female  was evaluated in triage.  Pt complains of lower abdominal pain that began today.  States that she was unable to eat anything today but denies any vomiting, diarrhea, pelvic complaints.  She had a CT scan done today and was told to come to the ER for possible appendicitis.  Has not take any medications for pain.  Review of Systems  Positive: Abdominal pain Negative: Vomiting, diarrhea  Physical Exam  BP 125/82 (BP Location: Right Arm)   Pulse (!) 121   Temp 98.6 F (37 C)   Resp 17   LMP 10/12/2020   SpO2 100%  Gen:   Awake, no distress Resp:  Normal effort MSK:   Moves extremities without difficulty Other:  Right lower quadrant tenderness but otherwise has generalized tenderness as well  Medical Decision Making  Medically screening exam initiated at 3:05 PM.  Appropriate orders placed.  Lelia Lampinen was informed that the remainder of the evaluation will be completed by another provider, this initial triage assessment does not replace that evaluation, and the importance of remaining in the ED until their evaluation is complete.  I reviewed chart using care everywhere.  CT abdomen pelvis done today without IV contrast showed:  IMPRESSION:  1. Top normal caliber of a fluid-filled appendix measuring up to 6 mm with questionable surrounding focal stranding, which is difficult to evaluate for given the diffuse congestion in the abdomen in this patient with underlying nephrotic syndrome. Findings are equivocal for possible acute appendicitis given the patient's reported abdominal pain. Recommend surgical consultation for further evaluation.  2. Small nonspecific free fluid in the pelvis.  3. 2 cysts in the right adnexa measuring up to 1.5 cm.  4. Gallbladder sludge or cholelithiasis in a nondistended gallbladder with no adjacent stranding.  5. 2 adjacent 2 mm pulmonary nodules in the right lower  lobe. Please see recommendations below.    Will order blood work today but patient will need surgical evaluation based on the CT findings   Delia Heady, PA-C 10/30/20 1508    Pattricia Boss, MD 11/03/20 1356

## 2020-10-30 NOTE — Consult Note (Signed)
General Surgery [facility]  Reason for Consult: abdominal pain, rule out acute appendicitis  Referring Physician: Dr. Blanchie Dessert, [facility]  Ann Robertson is an 30 y.o. female.  HPI: patient is a 30 yo female with onset of acute abdominal pain today.  Mild nausea.  No emesis.  Normal BM's this week - goes twice a week.  Apparently evaluated by primary MD at [facility] and referred to [facility] for CT scan.  Scan report states prominent appendix at 6 mm with possible stranding around appendix - cannot rule out appendicitis.  WBC normal at 5.4 with a normal differential.  Afebrile.  Patient states she has had this abdominal pain for 2 months but it is worse and different today.  Asked to evaluate by ER MD.  Previous abdominal surgery - C-section X4.  Medical history notable for juvenile DM and significant hyperglycemia today - 548 mg/dl.  Past Medical History:  Diagnosis Date  . Diabetes mellitus   . High cholesterol   . HSV-2 infection   . Juvenile diabetes 2000  . Juvenile diabetes mellitus   . Neuropathy   . Preeclampsia   . STD (sexually transmitted disease)     Past Surgical History:  Procedure Laterality Date  . CESAREAN SECTION    . CESAREAN SECTION N/A 07/05/2013   Procedure: CESAREAN SECTION;  Surgeon: Emily Filbert, MD;  Location: [facility];  Service: Obstetrics;  Laterality: N/A;  . CESAREAN SECTION N/A 07/25/2014   Procedure: CESAREAN SECTION;  Surgeon: Donnamae Jude, MD;  Location: [facility];  Service: Obstetrics;  Laterality: N/A;  . CESAREAN SECTION N/A 11/21/2015   Procedure: CESAREAN SECTION;  Surgeon: Donnamae Jude, MD;  Location: [place];  Service: Obstetrics;  Laterality: N/A;  . WISDOM TOOTH EXTRACTION      Family History  Problem Relation Age of Onset  . Stroke Mother   . Hypertension Mother   . Heart disease Mother   . Kidney disease Mother   . Hyperlipidemia Father   . Diabetes Father     Social History:  reports  that she has been smoking cigarettes. She has a 2.25 pack-year smoking history. She has never used smokeless tobacco. She reports current alcohol use. She reports that she does not use drugs.  Allergies:  Allergies  Allergen Reactions  . Hydromorphone Hcl Itching and Rash    Medications: I have reviewed the patient's current medications.  Results for orders placed or performed during the hospital encounter of 10/30/20 (from the past 48 hour(s))  CBC with Differential     Status: Abnormal   Collection Time: 10/30/20  3:07 PM  Result Value Ref Range   WBC 5.4 4.0 - 10.5 K/uL   RBC 3.51 (L) 3.87 - 5.11 MIL/uL   Hemoglobin 10.2 (L) 12.0 - 15.0 g/dL   HCT 30.2 (L) 36.0 - 46.0 %   MCV 86.0 80.0 - 100.0 fL   MCH 29.1 26.0 - 34.0 pg   MCHC 33.8 30.0 - 36.0 g/dL   RDW 12.6 11.5 - 15.5 %   Platelets 373 150 - 400 K/uL   nRBC 0.0 0.0 - 0.2 %   Neutrophils Relative % 60 %   Neutro Abs 3.3 1.7 - 7.7 K/uL   Lymphocytes Relative 33 %   Lymphs Abs 1.8 0.7 - 4.0 K/uL   Monocytes Relative 5 %   Monocytes Absolute 0.3 0.1 - 1.0 K/uL   Eosinophils Relative 1 %   Eosinophils Absolute 0.1  0.0 - 0.5 K/uL   Basophils Relative 1 %   Basophils Absolute 0.0 0.0 - 0.1 K/uL   Immature Granulocytes 0 %   Abs Immature Granulocytes 0.02 0.00 - 0.07 K/uL    Comment: Performed at [facility], [place] [address] [place], [place] [address]  Comprehensive metabolic panel     Status: Abnormal   Collection Time: 10/30/20  3:10 PM  Result Value Ref Range   Sodium 128 (L) 135 - 145 mmol/L   Potassium 4.8 3.5 - 5.1 mmol/L   Chloride 93 (L) 98 - 111 mmol/L   CO2 20 (L) 22 - 32 mmol/L   Glucose, Bld 548 (HH) 70 - 99 mg/dL    Comment: Glucose reference range applies only to samples taken after fasting for at least 8 hours. CRITICAL RESULT CALLED TO, READ BACK BY AND VERIFIED WITH: H DOOLEY,RN 1607 10/30/2020 WBOND    BUN 21 (H) 6 - 20 mg/dL   Creatinine, Ser 1.70 (H) 0.44 - 1.00 mg/dL   Calcium 8.8 (L) 8.9  - 10.3 mg/dL   Total Protein 6.1 (L) 6.5 - 8.1 g/dL   Albumin 2.5 (L) 3.5 - 5.0 g/dL   AST 15 15 - 41 U/L   ALT 11 0 - 44 U/L   Alkaline Phosphatase 66 38 - 126 U/L   Total Bilirubin 1.5 (H) 0.3 - 1.2 mg/dL   GFR, Estimated 41 (L) >60 mL/min    Comment: (NOTE) Calculated using the CKD-EPI Creatinine Equation (2021)    Anion gap 15 5 - 15    Comment: Performed at [facility], [place] [address] [place],  [address]  Lipase, blood     Status: None   Collection Time: 10/30/20  3:10 PM  Result Value Ref Range   Lipase 26 11 - 51 U/L    Comment: Performed at [place] [address], [place],  [address]  Resp Panel by RT-PCR (Flu A&B, Covid) Nasopharyngeal Swab     Status: None   Collection Time: 10/30/20  3:17 PM   Specimen: Nasopharyngeal Swab; Nasopharyngeal(NP) swabs in vial transport medium  Result Value Ref Range   SARS Coronavirus 2 by RT PCR NEGATIVE NEGATIVE    Comment: (NOTE) SARS-CoV-2 target nucleic acids are NOT DETECTED.  The SARS-CoV-2 RNA is generally detectable in upper respiratory specimens during the acute phase of infection. The lowest concentration of SARS-CoV-2 viral copies this assay can detect is 138 copies/mL. A negative result does not preclude SARS-Cov-2 infection and should not be used as the sole basis for treatment or other patient management decisions. A negative result may occur with  improper specimen collection/handling, submission of specimen other than nasopharyngeal swab, presence of viral mutation(s) within the areas targeted by this assay, and inadequate number of viral copies(<138 copies/mL). A negative result must be combined with clinical observations, patient history, and epidemiological information. The expected result is Negative.  Fact Sheet for Patients:  EntrepreneurPulse.com.au  Fact Sheet for Healthcare Providers:  IncredibleEmployment.be  This test is no t yet  approved or cleared by the [place] FDA and  has been authorized for detection and/or diagnosis of SARS-CoV-2 by FDA under an Emergency Use Authorization (EUA). This EUA will remain  in effect (meaning this test can be used) for the duration of the COVID-19 declaration under Section 564(b)(1) of the Act, 21 U.S.C.section 360bbb-3(b)(1), unless the authorization is terminated  or revoked sooner.       Influenza A by PCR  NEGATIVE NEGATIVE   Influenza B by PCR NEGATIVE NEGATIVE    Comment: (NOTE) The Xpert Xpress SARS-CoV-2/FLU/RSV plus assay is intended as an aid in the diagnosis of influenza from Nasopharyngeal swab specimens and should not be used as a sole basis for treatment. Nasal washings and aspirates are unacceptable for Xpert Xpress SARS-CoV-2/FLU/RSV testing.  Fact Sheet for Patients: EntrepreneurPulse.com.au  Fact Sheet for Healthcare Providers: IncredibleEmployment.be  This test is not yet approved or cleared by the [place] FDA and has been authorized for detection and/or diagnosis of SARS-CoV-2 by FDA under an Emergency Use Authorization (EUA). This EUA will remain in effect (meaning this test can be used) for the duration of the COVID-19 declaration under Section 564(b)(1) of the Act, 21 U.S.C. section 360bbb-3(b)(1), unless the authorization is terminated or revoked.  Performed at [facility], [place] [address], [place], [place] [address]     No results found.  Review of Systems  Constitutional: Positive for appetite change.  HENT: Negative.   Eyes: Negative.   Respiratory: Negative.   Cardiovascular: Negative.   Gastrointestinal: Positive for abdominal pain and nausea. Negative for constipation, diarrhea and vomiting.  Genitourinary: Negative.   Musculoskeletal: Negative.   Skin: Negative.   Allergic/Immunologic: Negative.   Neurological: Negative.   Hematological: Negative.   Psychiatric/Behavioral:  Negative.     Physical Exam  Blood pressure (!) 149/104, pulse (!) 111, temperature 98.6 F (37 C), resp. rate (!) 25, height '5\' 5"'$  (1.651 m), weight 47.6 kg, last menstrual period 10/12/2020, SpO2 100 %.  CONSTITUTIONAL: no acute distress; conversant; no obvious deformities  EYES: conjunctiva moist; no lid lag; anicteric; pupils equal bilaterally  NECK: trachea midline; no thyroid nodularity  LUNGS: respiratory effort normal & unlabored; no wheeze; no rales; no tactile fremitus  CV: Mild tacchycardia, rhythm regular; no palpable thrills; no murmur; no edema bilat lower extremities  GI: abdomen is not distended, scaphoid; mild to moderate tenderness diffusely, max in epigastrium; no hepatosplenomegaly; no obvious hernia; no mass; well healed Pfannensteil incison  MSK: normal range of motion of extremities; no clubbing; no cyanosis  PSYCH: appropriate affect for situation; alert and oriented to person, place, & time  LYMPHATIC: no palpable cervical lymphadenopathy; no evidence lymphedema in extremities   Assessment/Plan:  Abdominal pain, rule out acute appendicitis Hyperglycemia, impending DKA Stage II CKD Juvenile DM, uncontrolled  Outside CT suggesting acute appendicitis.  Exam is not consistent, WBC is normal, and patient is afebrile.  I believe she requires admission for pain control, Rx nausea, IV hydration, and glucose control.  Surgery will follow and evaluate again with repeat CBC, physical exam, and possible repeat CT scan in AM 6/3.  Discussed with Dr. Maryan Rued in ER.  Armandina Gemma, MD [facility] Office: [place] 10/30/2020, 5:20 PM

## 2020-10-30 NOTE — ED Notes (Signed)
Pt states she cannot urinate at this time.

## 2020-10-30 NOTE — ED Notes (Signed)
RN attempted report x1.  

## 2020-10-30 NOTE — ED Notes (Signed)
Pt is very disrespectful to RN. Pt states RN "has been doing the bare minimum and she will handle this by calling the CNO" RN asked pt not to be disrespectful and that RN is doing the best she can to help her. RN has done everything pt has asked. Pt has refused all meds and is highly irritable. Pt threateded to leave to smoke a cigarette if RN does not get nicotine patch quickly. PT requested to speak to charge and RN went to get CN but was busy. CN will address pt when she can. Pt is verbally abusive towards staff. MD and CN notified.

## 2020-10-30 NOTE — ED Notes (Signed)
MD at bedside. 

## 2020-10-30 NOTE — H&P (Signed)
[facility] Admission History and Physical Service Pager: [phone]  Patient name: Ann Robertson Medical record number: [id number] Date of birth: 1990/12/14 Age: 30 y.o. Gender: female  Primary Care Provider: [facility] [place] Consultants: Gen Surg Code Status: Full Preferred Emergency Contact: [name] [phone]  Chief Complaint: Abdominal Pain  Assessment and Plan: [name] is a 30 y.o. female presenting with abdominal pain. PMH is significant for T1DM, nephrotic syndrome, stage 3a CKD, HTN, peripheral neuropathy, and anemia.  Abdominal Pain Patient presents with periumbilical abdominal pain since this morning. She had an outpatient CT performed today that was equivocal for possible appendicitis. In the ED, she is afebrile but hypertensive and mildly tachycardic. She has periumbilical tenderness on exam but no rebound, guarding, or McBurney's point tenderness. WBC wnl. Remainder of labs remarkable for glucose 548, CO2 20, anion gap 15, and creatinine 1.70, bilirubin 1.5. Lipase, AST/ALT normal. Etiology unclear-- favor possible early appendicitis. Less likely gastritis, no vomiting/diarrhea or sick contacts to suggest gastroenteritis, lipase wnl so do not suspect pancreatitis, and no evidence of DKA. -Admit to FPTS, med-surg, attending Dr. Erin Hearing -Vitals per unit routine -Gen surgery following, appreciate recommendations -NPO  -Tylenol 68m q6h prn for mild/moderate pain -Morphine 290mIV q4h prn for severe pain -Zofran 61m5m8h prn for nausea -Repeat CBC, BMP tomorrow am -Consider repeat CT abdomen/pelvis tomorrow am, or overnight if worsening pain/vitals -No antibiotics per surgery -LR at 150m52m  T1DM with Hyperglycemia  Peripheral Neuropathy Glucose 548 on admission with CO2 20 and anion gap 15. VBG showed normal pH (7.334), so not in DKA. UA pending. Patient's most recent A1c 12.1%. Home meds: Lantus 14u daily. -CBG  monitoring q6h while NPO -7u Lantus -sSSI -Continue home gabapentin 600mg20m  HTN BP significantly elevated on admission to 160s/100s. Home meds: Chlorthalidone 25mg 59my however patient has not taken this in several weeks. -Chlorthalidone 25mg d1m -Monitor BP  CKD Stage 3a  Nephrotic Syndrome Creatinine 1.70 on admission (baseline appears to be ~1.5). Per chart review patient has a history of nephrotic syndrome (on chlorthalidone for HTN and nephrotic syndrome). Appears dehydrated on exam and given osmotic diuresis due to hyperglycemia, will start IV fluids. -am BMP -Continue home Chlorthalidone 25mg da46m-LR at 150mL/hr 93mid nephrotoxic agents -Monitor UOP  Anemia Hgb 10.2 on admission. Baseline hemoglobin seems to be anywhere from 9.5-11. Unclear etiology but suspect multifactorial (iron deficiency and anemia of chronic disease) States she took iron in the past but did not tolerate due to constipation. -am CBC -Consider obtained  iron studies   Tobacco Use Smokes ~1/4 PPD.  -Nicotine patch 161mg dail33mFEN/GI: NPO Prophylaxis: Lovenox  Disposition: med-surg  History of Present Illness:  Ann Robertson.o. fe27le presenting with abdominal pain.  Patient woke up this morning with abdominal pain. States she has had intermittent epigastric pain over the past 1 month (occurring ~2x/week) which has been mild and not concerning to her. This morning her pain was different. She reports "I've never been in this much pain". It was located in the periumbilical region. Endorses decreased appetite and some nausea but no vomiting. She had CT of her abdomen as an outpatient. Called with results and told to come to ED for appendicitis. Reports she was still in pain on arrival and it was hard to talk and hard to breathe secondary to severe pain. Received morphine in the ED which helped significantly. Last BM 2-3 days ago. No diarrhea, no fever, no urinary  symptoms. No chest  pain, SOB, headache, or other complaints.   Review Of Systems: Per HPI with the following additions:  Review of Systems  All other systems reviewed and are negative.    Patient Active Problem List   Diagnosis Date Noted  . Abdominal pain, acute, epigastric 10/30/2020  . Nausea 06/10/2020  . Foot pain, right 04/05/2018  . Postpartum care following cesarean delivery 11/21/2015  . Preterm labor 11/16/2015  . Noncompliance with diabetes treatment 11/15/2015  . Traumatic injury during pregnancy in third trimester 11/14/2015  . Maternal chronic hypertension 11/06/2015  . Previous cesarean section complicating pregnancy 14/48/1856  . Short interval between pregnancies complicating pregnancy, antepartum 07/14/2015  . History of preterm delivery, currently pregnant 07/14/2015  . History of pre-eclampsia in prior pregnancy, currently pregnant 07/14/2015  . Supervision of high risk pregnancy, antepartum 07/14/2015  . History of preterm delivery 09/06/2014  . History of polyhydramnios 09/06/2014  . 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[place]) 12/06/2013    Past Medical History: Past Medical History:  Diagnosis Date  . Diabetes mellitus   . High cholesterol   . HSV-2 infection   . Juvenile diabetes 2000  . Juvenile diabetes mellitus   . Neuropathy   . Preeclampsia   . STD (sexually transmitted disease)     Past Surgical History: Past Surgical History:  Procedure Laterality Date  . CESAREAN SECTION    . CESAREAN SECTION N/A 07/05/2013   Procedure: CESAREAN SECTION;  Surgeon: Emily Filbert, MD;  Location: [facility];  Service: Obstetrics;  Laterality: N/A;  . CESAREAN SECTION N/A 07/25/2014   Procedure: CESAREAN SECTION;  Surgeon: Donnamae Jude, MD;  Location: [facility];  Service: Obstetrics;  Laterality: N/A;  . CESAREAN SECTION N/A 11/21/2015   Procedure: CESAREAN SECTION;  Surgeon: Donnamae Jude, MD;   Location: [facility];  Service: Obstetrics;  Laterality: N/A;  . WISDOM TOOTH EXTRACTION      Social History: Social History   Tobacco Use  . Smoking status: Current Every Day Smoker    Packs/day: 0.25    Years: 9.00    Pack years: 2.25    Types: Cigarettes  . Smokeless tobacco: Never Used  Substance Use Topics  . Alcohol use: Yes    Comment: occasional  . Drug use: No    Family History: Family History  Problem Relation Age of Onset  . Stroke Mother   . Hypertension Mother   . Heart disease Mother   . Kidney disease Mother   . Hyperlipidemia Father   . Diabetes Father     Allergies and Medications: Allergies  Allergen Reactions  . Hydromorphone Hcl Itching and Rash   No current facility-administered medications on file prior to encounter.   Current Outpatient Medications on File Prior to Encounter  Medication Sig Dispense Refill  . Blood Glucose Monitoring Suppl (ACCU-CHEK AVIVA PLUS) w/Device KIT Use to check sugar at least 4 times daily. 1 kit 0  . chlorthalidone (HYGROTON) 25 MG tablet Take 1 tablet (25 mg total) by mouth daily. 30 tablet 0  . fluconazole (DIFLUCAN) 100 MG tablet Take 1 tablet (100 mg total) by mouth daily. 1 tablet prn  . glucose blood (ACCU-CHEK GUIDE) test strip Use as instructed 100 each 12  . insulin glargine (LANTUS SOLOSTAR) 100 UNIT/ML Solostar Pen Inject 12 Units into the skin every morning. And pen needles 1/day 15 mL 3  . Insulin Pen Needle (  PEN NEEDLES) 30G X 8 MM MISC 1 each by Does not apply route daily. E11.9 90 each 0  . ondansetron (ZOFRAN ODT) 4 MG disintegrating tablet Take 1 tablet (4 mg total) by mouth every 8 (eight) hours as needed for nausea or vomiting. 10 tablet 0  . ondansetron (ZOFRAN) 4 MG tablet Take 1 tablet (4 mg total) by mouth every 8 (eight) hours as needed for nausea or vomiting. 20 tablet 0    Objective: BP (!) 149/104   Pulse (!) 111   Temp 98.6 F (37 C)   Resp (!) 25   Ht 5' 5"  (1.651 m)   Wt  47.6 kg   LMP 10/12/2020   SpO2 100%   BMI 17.47 kg/m  Exam: General: alert, well-appearing, resting comfortably in bed Eyes: PERRL ENTM: dry mucous membranes, oropharynx normal without erythema or edema Neck: supple, no cervical lymphadenopathy Cardiovascular: mildly tachycardic, regular rhythm, normal S1/S2 without m/r/g Respiratory: normal effort, lungs CTAB Gastrointestinal: normoactive bowel sounds, soft, nondistended, moderate periumbilical tenderness, mild epigastric tenderness, very slight RLQ tenderness to deep palpation. No rebound or guarding. Negative psoas sign. MSK: no peripheral edema, 5/5 strength in all extremities Derm: no rashes appreciated Neuro: grossly intact Psych: appropriate affect, normal speech  Labs and Imaging: CBC BMET  Recent Labs  Lab 10/30/20 1507  WBC 5.4  HGB 10.2*  HCT 30.2*  PLT 373   Recent Labs  Lab 10/30/20 1510  NA 128*  K 4.8  CL 93*  CO2 20*  BUN 21*  CREATININE 1.70*  GLUCOSE 548*  CALCIUM 8.8*      EKG: My own interpretation (not copied from electronic read): sinus tachycardia at 108bpm, otherwise normal  CT Abd/Pelvis WO IV Contrast (from Care Everywhere, [place]) IMPRESSION:  1.Top normal caliber of a fluid-filled appendix measuring up to 6 mm with questionable surrounding focal stranding, which is difficult to evaluate for given the diffuse congestion in the abdomen in this patient with underlying nephrotic syndrome. Findings are equivocal for possible acute appendicitis given the patient's reported abdominal pain. Recommend surgical consultation for further evaluation.  2. Small nonspecific free fluid in the pelvis.  3. 2 cysts in the right adnexa measuring up to 1.5 cm.  4. Gallbladder sludge or cholelithiasis in a nondistended gallbladder with no adjacent stranding.  5. 2 adjacent 2 mm pulmonary nodules in the right lower lobe. Please see recommendations below.   Alcus Dad, MD 10/30/2020, 5:25  PM PGY-1, [place] Intern pager: [phone], text pages welcome

## 2020-10-30 NOTE — ED Triage Notes (Signed)
Pt reports mid abd pain that started this morning. Denies n/v/d. Pt sent from doctors office for a CT scan showing appendicitis.

## 2020-10-30 NOTE — ED Notes (Signed)
Received verbal report from [place]. RN

## 2020-10-31 ENCOUNTER — Observation Stay (HOSPITAL_COMMUNITY): Payer: Medicaid Other

## 2020-10-31 DIAGNOSIS — R1033 Periumbilical pain: Principal | ICD-10-CM

## 2020-10-31 DIAGNOSIS — R739 Hyperglycemia, unspecified: Secondary | ICD-10-CM

## 2020-10-31 LAB — CBC
HCT: 25.7 % — ABNORMAL LOW (ref 36.0–46.0)
Hemoglobin: 8.7 g/dL — ABNORMAL LOW (ref 12.0–15.0)
MCH: 28.9 pg (ref 26.0–34.0)
MCHC: 33.9 g/dL (ref 30.0–36.0)
MCV: 85.4 fL (ref 80.0–100.0)
Platelets: 345 10*3/uL (ref 150–400)
RBC: 3.01 MIL/uL — ABNORMAL LOW (ref 3.87–5.11)
RDW: 12.5 % (ref 11.5–15.5)
WBC: 4.8 10*3/uL (ref 4.0–10.5)
nRBC: 0 % (ref 0.0–0.2)

## 2020-10-31 LAB — BASIC METABOLIC PANEL
Anion gap: 14 (ref 5–15)
BUN: 26 mg/dL — ABNORMAL HIGH (ref 6–20)
CO2: 19 mmol/L — ABNORMAL LOW (ref 22–32)
Calcium: 8.2 mg/dL — ABNORMAL LOW (ref 8.9–10.3)
Chloride: 94 mmol/L — ABNORMAL LOW (ref 98–111)
Creatinine, Ser: 2.03 mg/dL — ABNORMAL HIGH (ref 0.44–1.00)
GFR, Estimated: 33 mL/min — ABNORMAL LOW (ref >60)
Glucose, Bld: 611 mg/dL (ref 70–99)
Potassium: 4.9 mmol/L (ref 3.5–5.1)
Sodium: 127 mmol/L — ABNORMAL LOW (ref 135–145)

## 2020-10-31 LAB — HCG, SERUM, QUALITATIVE: Preg, Serum: NEGATIVE

## 2020-10-31 LAB — RAPID URINE DRUG SCREEN, HOSP PERFORMED
Amphetamines: NOT DETECTED
Barbiturates: NOT DETECTED
Benzodiazepines: NOT DETECTED
Cocaine: NOT DETECTED
Opiates: POSITIVE — AB
Tetrahydrocannabinol: NOT DETECTED

## 2020-10-31 LAB — GLUCOSE, CAPILLARY
Glucose-Capillary: 582 mg/dL (ref 70–99)
Glucose-Capillary: 445 mg/dL — ABNORMAL HIGH (ref 70–99)
Glucose-Capillary: 227 mg/dL — ABNORMAL HIGH (ref 70–99)
Glucose-Capillary: 61 mg/dL — ABNORMAL LOW (ref 70–99)
Glucose-Capillary: 66 mg/dL — ABNORMAL LOW (ref 70–99)
Glucose-Capillary: 92 mg/dL (ref 70–99)
Glucose-Capillary: 201 mg/dL — ABNORMAL HIGH (ref 70–99)

## 2020-10-31 LAB — HIV ANTIBODY (ROUTINE TESTING W REFLEX): HIV Screen 4th Generation wRfx: NONREACTIVE

## 2020-10-31 MED ORDER — FREESTYLE LIBRE 2 SENSOR MISC: 1.0000 | Freq: Once | 0 refills | Status: AC

## 2020-10-31 MED ORDER — TRAMADOL HCL 50 MG PO TABS: 50.0000 mg | ORAL_TABLET | Freq: Two times a day (BID) | ORAL | 0 refills | Status: AC

## 2020-10-31 MED ORDER — INSULIN ASPART 100 UNIT/ML IJ SOLN
5.0000 [IU] | Freq: Once | INTRAMUSCULAR | Status: AC
  Administered 2020-10-31: 5 [IU] via SUBCUTANEOUS

## 2020-10-31 MED ORDER — IOHEXOL 9 MG/ML PO SOLN
ORAL | Status: AC
  Filled 2020-10-31: qty 1000

## 2020-10-31 MED ORDER — INSULIN ASPART 100 UNIT/ML IJ SOLN
0.0000 [IU] | INTRAMUSCULAR | Status: DC
  Administered 2020-10-31: 3 [IU] via SUBCUTANEOUS
  Administered 2020-10-31: 9 [IU] via SUBCUTANEOUS

## 2020-10-31 MED ORDER — INSULIN GLARGINE 100 UNIT/ML ~~LOC~~ SOLN
7.0000 [IU] | Freq: Every day | SUBCUTANEOUS | Status: DC
  Filled 2020-10-31 (×2): qty 0.07

## 2020-10-31 MED ORDER — DIPHENHYDRAMINE HCL 25 MG PO CAPS
25.0000 mg | ORAL_CAPSULE | Freq: Four times a day (QID) | ORAL | Status: DC | PRN
  Administered 2020-10-31 (×2): 25 mg via ORAL
  Filled 2020-10-31 (×2): qty 1

## 2020-10-31 MED ORDER — LANTUS SOLOSTAR 100 UNIT/ML ~~LOC~~ SOPN: 12.0000 [IU] | PEN_INJECTOR | SUBCUTANEOUS | 3 refills | Status: DC

## 2020-10-31 MED ORDER — FREESTYLE LIBRE 2 READER DEVI: 1.0000 | Freq: Four times a day (QID) | 0 refills | Status: DC

## 2020-10-31 NOTE — Progress Notes (Addendum)
Inpatient Diabetes Program Recommendations  AACE/ADA: New Consensus Statement on Inpatient Glycemic Control   Target Ranges:  Prepandial:   less than 140 mg/dL      Peak postprandial:   less than 180 mg/dL (1-2 hours)      Critically ill patients:  140 - 180 mg/dL   Results for Ann Robertson, Ann Robertson (MRN [id number]) as of 10/31/2020 09:45  Ref. Range 10/29/2020 19:03 10/30/2020 23:00 10/31/2020 01:05 10/31/2020 03:48 10/31/2020 07:00  Glucose-Capillary Latest Ref Range: 70 - 99 mg/dL 357 (H) 471 (H) 582 (HH) 445 (H) 227 (H)  Results for Ann Robertson, Ann Robertson (MRN [id number]) as of 10/31/2020 09:45  Ref. Range 06/10/2020 09:27 09/10/2020 13:35  Hemoglobin A1C Latest Ref Range: 4.0 - 5.6 % 11.9 (A) >15   Review of Glycemic Control  Diabetes history: DM1 (makes NO insulin; requires basal, correction, and carb coverage insulin) Outpatient Diabetes medications: Lantus 14 units QAM Current orders for Inpatient glycemic control: Lantus 7 units daily, Novolog 0-9 units TID with meals, Novolog 0-5 units QHS  Inpatient Diabetes Program Recommendations:    Insulin: Please consider increasing Lantus to 12 units daily and when diet resumed adding Novolog 3 units TID with meals for meal coverage if patient eats at least 50% of meals.  NOTE: In reviewing chart, noted patient has Type 1 DM (therefore makes NO insulin) dx in 841 at 30 years old. Patient sees Dr. Loanne Drilling (Endocrinologist) and was last seen on 09/10/20 at which time A1C was >15% indicating average glucose is over 384 mg/dl. Given that patient makes no insulin and in order to optimize glycemic control, anticipate she needs to be taking basal, correction, and carb coverage insulin to cover carbohydrates consumed. Will plan to speak with patient today.  10/31/20'@13'$ :10-Spoke with patient at bedside. She confirms that she has DM79 dx at 29 years old. Patient states that she has been taking Lantus 14 units QAM. Patient reports that her glucose generally runs high 300-500's mg/dl  most of the time with rare hypoglycemia which coincides with A1C over 15%. Inquired about any short acting insulin for correction and meal coverage. Patient states that she use to use short acting insulin (has used both Novolog and Humalog in the past) and notes that she use to use 1 unit for every 15 grams of carbs and has used a correction scale but is not familiar with a sensitivity factor (how much 1 unit of insulin drops glucose). Patient states that her Endocrinologist only has her prescribed Lantus currently. Patient reports that she eats small amounts and usually eats one meal a day. Patient also notes that she drinks mostly Bright and Early orange juice. Discussed that since patients with DM1 do not make any insulin and require insulin to cover carbohydrates.  Patient states she would be willing to take short acting insulin with meals if prescribed. Patient states that she has never used an insulin pump or a CGM. Discussed CGMs and how they could provide more information for her and her providers to use to make insulin adjustments to get DM under better control.  Discussed FreeStyle Libre CGMs and how they work. Patient states she would be interested in using a CGM. Will ask attending providers if they would provide an order to give patients 2 FreeStyle Libre2 sensors. Informed patient that attending providers would have to give permission for me to give her samples. Given order from Dr. Rock Nephew to provide FreeStyle Libre2 sensors samples (2).  Instructed patient on how to insert and use FreeStyle Libre2. Asked  patient to download FreeStyle Libre 2 app to use with sensors. Provided patient with the 2 samples of FreeStyle Libre2 and asked patient to apply once she is discharged from the hospital. In talking with patient, she reports that she has never heard some of the things I told her about. Patient had stated that if she did not eat, she sometimes would not take the insulin due to fear of hypoglycemia.  Discussed Lantus insulin in detail and how it works (onset and duration); also discussed Novolog and how it works (onset and duration).  Patient reports that her brother passed away 3 weeks ago and she promised him she would do better with taking care of her diabetes. She also has 4 children and she wants to get more energy and feel better so she can play and interact with them more.  Patient seems very motivated to get DM under better control. Provided emotional support and encouraged patient to work on getting DM under control to decrease risk of further complications from uncontrolled DM. At time of discharge please provide patient with Rx for: Novolog Flexpens [phone]), insulin pen needles [phone]), and FreeStyle Libre2 sensors [phone]; 2 sensors per month).  Thanks, Barnie Alderman, RN, MSN, CDE Diabetes Coordinator Inpatient Diabetes Program [phone] (Team Pager from 8am to 5pm)

## 2020-10-31 NOTE — Progress Notes (Signed)
Family Medicine Teaching Service Daily Progress Note Intern Pager: [phone]  Patient name: Ann Robertson Medical record number: [id number] Date of birth: May 10, 1991 Age: 30 y.o. Gender: female  Primary Care Provider: [place] Consultants: Gen surg Code Status: Full  Pt Overview and Major Events to Date:  6/2: admitted, gen surg consulted  Assessment and Plan:  Ann Robertson is a 30 y.o. female presenting with abdominal pain. PMH is significant for T1DM, nephrotic syndrome, stage 3a CKD, HTN, peripheral neuropathy, and anemia.  Abdominal Pain, Possible Early Appendicitis Outpatient CT abdomen/pelvis performed yesterday was equivocal for appendicitis. Her pain is somewhat improved this morning. Was able to tolerate a sandwich last night. She remains afebrile without leukocytosis so appendicitis seems less likely at this time. Alternate etiology unclear- not c/w pancreatitis, cholecystitis, or gastroenteritis. -Gen surg following, appreciate recommendations -Repeat CT abdomen/pelvis this morning  -NPO for now -Tylenol '650mg'$  q6h prn for mild pain -Morphine '2mg'$  q4h prn for severe pain -Zofran ODT '4mg'$  q8h prn for nausea  T1DM with Hyperglycemia  Peripheral Neuropathy Glucose significantly elevated between 445-611 overnight, but no evidence of DKA. Patient refused her 7u lantus last night but has received 14u Aspart. Glucose improved this morning at 227. -CBG monitoring q6h while NPO -Lantus 7u daily -sSSI -Continue home gabapentin '600mg'$  TID for neuropathy  AKI on CKD Stage 3a  Nephrotic Syndrome Cr 2.03 this morning, from 1.7 on admission. Baseline appears to be ~1.5. UA with >300 protein. Appears euvolemic on exam. Urine spec gravity 1.017 yesterday evening.  -Continue LR at 150cc/hr for now -Repeat BMP this afternoon -If worsening kidney function, could consider nephrology consult due to hx of nephrotic syndrome  Normocytic Anemia Hgb 8.7 this morning from 10.2  on admission. Baseline appears to be 9.5-11. No concern for bleeding at this time. Suspect multifactorial from iron deficiency anemia and anemia of chronic disease. May also be slightly dilutional as patient is receiving IV fluids at 150cc/hr/ -Check iron studies -am CBC  Tachycardia Patient persistently tachycardic since admission. HR 110s-120s. Was on metoprolol in the past but could not tolerate due to vision side effects. Etiology of tachycardia possibly related to her anemia. Consider thyroid etiology (family hx of Graves). Does not appear volume depleted on exam. -Check TSH -Monitor HR  HTN BP range 129-176/88-116 since admission. Home meds: chlorthalidone '25mg'$  daily -Continue chlorthalidone '25mg'$  daily  Tobacco Use Smokes 1/4PPD. -Nicotine patch '14mg'$  daily -Discuss cessation resources   FEN/GI: NPO PPx: Lovenox   Status is: Observation The patient will require care spanning > 2 midnights and should be moved to inpatient because: Ongoing diagnostic testing needed not appropriate for outpatient work up  Dispo: The patient is from: Home              Anticipated d/c is to: Home              Patient currently is not medically stable to d/c.   Difficult to place patient No    Subjective:  Patient reports her abdominal pain is somewhat improved this morning although still present. Denies nausea or vomiting. Has not had bowel movement. Tolerated a sandwich last night without difficulty. Also reports "racing heart" which has reportedly been a longstanding issue for her. No other complaints at this time.  Objective: Temp:  [98 F (36.7 C)-98.6 F (37 C)] 98.4 F (36.9 C) (06/03 0844) Pulse Rate:  [109-124] 112 (06/03 0844) Resp:  [16-34] 18 (06/03 0844) BP: (125-176)/(82-116) 145/105 (06/03 0844) SpO2:  [98 %-100 %]  100 % (06/03 0844) Weight:  [47.6 kg] 47.6 kg (06/02 1514) Physical Exam: General: alert, well-appearing, NAD Cardiovascular: tachycardic, regular rhythm,  normal S1/S2 without m/r/g Respiratory: normal effort, lungs CTAB Abdomen: normoactive bowel sounds, soft, moderately tender in periumbilical region, RUQ and RLQ, no rebound or guarding, no skin changes Extremities: no peripheral edema  Laboratory: Recent Labs  Lab 10/29/20 1620 10/30/20 1507 10/30/20 1821 10/31/20 0151  WBC 5.0 5.4  --  4.8  HGB 9.5* 10.2* 9.5* 8.7*  HCT 28.0* 30.2* 28.0* 25.7*  PLT 391 373  --  345   Recent Labs  Lab 10/29/20 1620 10/30/20 1510 10/30/20 1821 10/31/20 0151  NA 128* 128* 128* 127*  K 4.9 4.8 5.5* 4.9  CL 97* 93*  --  94*  CO2 25 20*  --  19*  BUN 23* 21*  --  26*  CREATININE 1.50* 1.70*  --  2.03*  CALCIUM 8.4* 8.8*  --  8.2*  PROT  --  6.1*  --   --   BILITOT  --  1.5*  --   --   ALKPHOS  --  66  --   --   ALT  --  11  --   --   AST  --  15  --   --   GLUCOSE 504* 548*  --  611*     Imaging/Diagnostic Tests: No new imaging since admission.   Alcus Dad, MD 10/31/2020, 11:33 AM PGY-1, [place] Intern pager: [phone], text pages welcome

## 2020-10-31 NOTE — Plan of Care (Signed)

## 2020-10-31 NOTE — Progress Notes (Signed)
FPTS Interim Progress Note  Went to assess patients abdominal pain.  Patient reports she had not been seen by admitting physician since she had been moved to from first ED room to room 27.  Reports that she was not told that she was she needed to have IV fluids.  She was upset that she was previously told she was able to eat but RN had no order for diet.  She was also upset that her pain was not managed and had not had anything since she first came to hospital.  She endorsed that her abdominal pain had not gotten worse and had improved a little with the first pain medication.  Denies any fevers, nausea, vomiting, diarrhea or constipation.  No urinary symptoms.   Patient was extremely upset that sister was not allowed to visit.  Rounded with primary RN who had discussed with charge nurse.  Charge nurse to see patient but was currently in CODE.   Orders provided.  Appreciated nightly round.  Today's Vitals   10/30/20 2342 10/31/20 0025 10/31/20 0243 10/31/20 0411  BP: (!) 176/107  129/88 (!) 153/111  Pulse: (!) 116  (!) 122 (!) 117  Resp: '16  18 18  '$ Temp: 98 F (36.7 C)  98 F (36.7 C) 98.1 F (36.7 C)  TempSrc: Oral  Oral Oral  SpO2: 100%  100% 100%  Weight:      Height:      PainSc: 10-Worst pain ever 9        Lantus discontinued as patient refused. mSSI q 4h ordered Continue meal coverage Diet ordered until MN then NPO pending surgery recommendations Give LR bolus then 150/hr Continue Chlorthalidone and Gabapentin Morphine 2 mg IV Benadryl 25 mg po x1 for itching Repeat CBG 582, 9 units aspart ordered Repeat CBG 445, 5 units aspart ordered Recheck in 1 hr Lantus 7 units subcutaneous UDS positive for opioids, likely from Morphine given in ED Continue to monitor, patient will soon be transferred to floor.   Carollee Leitz, MD Family Medicine Residency

## 2020-10-31 NOTE — Progress Notes (Signed)
General Surgery Follow Up Note  Subjective:    Overnight Issues:   Objective:  Vital signs for last 24 hours: Temp:  [97.9 F (36.6 C)-98.4 F (36.9 C)] 97.9 F (36.6 C) (06/03 1700) Pulse Rate:  [101-124] 101 (06/03 1700) Resp:  [16-22] 20 (06/03 1700) BP: (129-178)/(88-115) 178/109 (06/03 1700) SpO2:  [98 %-100 %] 100 % (06/03 1700)  Hemodynamic parameters for last 24 hours:    Intake/Output from previous day: 06/02 0701 - 06/03 0700 In: 1000 [IV Piggyback:1000] Out: -   Intake/Output this shift: No intake/output data recorded.  Vent settings for last 24 hours:    Physical Exam:  Gen: comfortable, no distress Neuro: non-focal exam HEENT: PERRL Neck: supple CV: RRR Pulm: unlabored breathing Abd: soft, NT GU: clear yellow urine Extr: wwp, no edema   Results for orders placed or performed during the hospital encounter of 10/30/20 (from the past 24 hour(s))  CBG monitoring, ED     Status: Abnormal   Collection Time: 10/30/20 11:00 PM  Result Value Ref Range   Glucose-Capillary 471 (H) 70 - 99 mg/dL  Glucose, capillary     Status: Abnormal   Collection Time: 10/31/20  1:05 AM  Result Value Ref Range   Glucose-Capillary 582 (HH) 70 - 99 mg/dL   Comment 1 Notify RN   HIV Antibody (routine testing w rflx)     Status: None   Collection Time: 10/31/20  1:51 AM  Result Value Ref Range   HIV Screen 4th Generation wRfx Non Reactive Non Reactive  CBC     Status: Abnormal   Collection Time: 10/31/20  1:51 AM  Result Value Ref Range   WBC 4.8 4.0 - 10.5 K/uL   RBC 3.01 (L) 3.87 - 5.11 MIL/uL   Hemoglobin 8.7 (L) 12.0 - 15.0 g/dL   HCT 25.7 (L) 36.0 - 46.0 %   MCV 85.4 80.0 - 100.0 fL   MCH 28.9 26.0 - 34.0 pg   MCHC 33.9 30.0 - 36.0 g/dL   RDW 12.5 11.5 - 15.5 %   Platelets 345 150 - 400 K/uL   nRBC 0.0 0.0 - 0.2 %  Basic metabolic panel     Status: Abnormal   Collection Time: 10/31/20  1:51 AM  Result Value Ref Range   Sodium 127 (L) 135 - 145 mmol/L    Potassium 4.9 3.5 - 5.1 mmol/L   Chloride 94 (L) 98 - 111 mmol/L   CO2 19 (L) 22 - 32 mmol/L   Glucose, Bld 611 (HH) 70 - 99 mg/dL   BUN 26 (H) 6 - 20 mg/dL   Creatinine, Ser 2.03 (H) 0.44 - 1.00 mg/dL   Calcium 8.2 (L) 8.9 - 10.3 mg/dL   GFR, Estimated 33 (L) >60 mL/min   Anion gap 14 5 - 15  Glucose, capillary     Status: Abnormal   Collection Time: 10/31/20  3:48 AM  Result Value Ref Range   Glucose-Capillary 445 (H) 70 - 99 mg/dL  Glucose, capillary     Status: Abnormal   Collection Time: 10/31/20  7:00 AM  Result Value Ref Range   Glucose-Capillary 227 (H) 70 - 99 mg/dL  hCG, serum, qualitative     Status: None   Collection Time: 10/31/20  7:56 AM  Result Value Ref Range   Preg, Serum NEGATIVE NEGATIVE  Glucose, capillary     Status: Abnormal   Collection Time: 10/31/20 11:15 AM  Result Value Ref Range   Glucose-Capillary 61 (L) 70 -  99 mg/dL  Glucose, capillary     Status: Abnormal   Collection Time: 10/31/20 12:00 PM  Result Value Ref Range   Glucose-Capillary 66 (L) 70 - 99 mg/dL  Glucose, capillary     Status: None   Collection Time: 10/31/20 12:49 PM  Result Value Ref Range   Glucose-Capillary 92 70 - 99 mg/dL  Glucose, capillary     Status: Abnormal   Collection Time: 10/31/20  5:45 PM  Result Value Ref Range   Glucose-Capillary 201 (H) 70 - 99 mg/dL    Assessment & Plan:  Present on Admission: . Abdominal pain    LOS: 0 days   Additional comments:I reviewed the patient's new clinical lab test results.   and I reviewed the patients new imaging test results.    Abdominal pain, rule out acute appendicitis Hyperglycemia, impending DKA Stage II CKD Juvenile DM, uncontrolled  Outside CT suggesting acute appendicitis.  Exam is not consistent, WBC remains normal, patient remains afebrile. Repeat CT A/P today pending. Suspect no surgical findings present.    Jesusita Oka, MD Trauma & General Surgery Please use [email or web address] to contact on call  provider  10/31/2020  *Care during the described time interval was provided by me. I have reviewed this patient's available data, including medical history, events of note, physical examination and test results as part of my evaluation.    CT resulted. Negative for intra-abdominal process. Cleared from surgery standpoint for regular diet and discharge. Acknowledge hyperglycemia and other medical problems may preclude discharge. No surgical needs.   Jesusita Oka, MD General and [place] [facility]

## 2020-10-31 NOTE — Progress Notes (Signed)
Yellow MEWS due to patient HR. Paged team to discuss and patient ok to continue with d/c tonight.      10/31/20 2050  Vitals  Temp 98.4 F (36.9 C)  Temp Source Oral  BP (!) 148/111  MAP (mmHg) 121  BP Location Right Arm  BP Method Automatic  Patient Position (if appropriate) Lying  Pulse Rate (!) 120  Pulse Rate Source Dinamap  Resp 18  Level of Consciousness  Level of Consciousness Alert  MEWS COLOR  MEWS Score Color Yellow  Oxygen Therapy  SpO2 100 %  O2 Device Room Air  Pain Assessment  Pain Scale 0-10  Pain Score 7  Pain Type Acute pain  Pain Location Abdomen  Pain Orientation Mid  Pain Descriptors / Indicators Aching  Pain Frequency Intermittent  Pain Onset Gradual  Pain Intervention(s) Medication (See eMAR)  Multiple Pain Sites No  Complaints & Interventions  Complains of Other (Comment) (pain)  Interventions Medication (see MAR)  Glasgow Coma Scale  Eye Opening 4  Best Verbal Response (NON-intubated) 5  Best Motor Response 6  Glasgow Coma Scale Score 15  MEWS Score  MEWS Temp 0  MEWS Systolic 0  MEWS Pulse 2  MEWS RR 0  MEWS LOC 0  MEWS Score 2  Provider Notification  Provider Name/Title Maness, Intern  Date Provider Notified 10/31/20  Time Provider Notified 2057  Notification Type Page  Notification Reason Other (Comment) (yellow MEWS)  Provider response Other (Comment);No new orders (call back)  Date of Provider Response 10/31/20  Time of Provider Response 2101

## 2020-10-31 NOTE — Progress Notes (Signed)
CBG collected and critical value noted. Notified Family Med intern to discuss. Orders placed for sliding scale insulin. Will give per orders, see MAR.

## 2020-10-31 NOTE — Discharge Summary (Signed)
[facility] Discharge Summary  Patient name: Ann Robertson Medical record number: [id number] Date of birth: 23-Sep-1990 Age: 30 y.o. Gender: female Date of Admission: 10/30/2020  Date of Discharge: 10/31/2020 Admitting Physician: Leeanne Rio, MD  Primary Care Provider: [facility] [place] Consultants: General Surgery   Indication for Hospitalization: Abdominal Pain  Discharge Diagnoses/Problem List:  Abdominal Pain Hyperglycemia T1DM AKI on CKD Stage 3a Nephrotic Syndrome Normocytic Anemia Tachycardia HTN Peripheral Neuropathy Tobacco Use  Disposition: Home   Discharge Condition: Improved   Discharge Exam:  General: alert, well-appearing, NAD Cardiovascular: tachycardic, regular rhythm, normal S1/S2 without m/r/g Respiratory: normal effort, lungs CTAB Abdomen: normoactive bowel sounds, soft, moderately tender in periumbilical region, RUQ and RLQ, no rebound or guarding, no skin changes Extremities: no peripheral edema  Brief Hospital Course:   Ann Johnsonis a 30 y.o.femalewho presented with abdominal pain and possible appendicitis. PMH is significant forT1DM, nephrotic syndrome, stage 3a CKD,HTN,peripheral neuropathy, andanemia.  Abdominal Pain Patient admitted for abdominal pain after outpatient CT was concerning for possible early appendicitis. Patient with periumbilical pain and tenderness on admission but was afebrile with normal white count. Repeat CT the following morning demonstrated normal appendix and no abnormal findings. Her pain had improved and she was able to tolerate PO so patient was discharged home.   Hyperglycemia, T1DM Patient with significant hyperglycemia (CBGs in the 500s) on admission, but no evidence of DKA. She was treated with sliding scale insulin while admitted. Patient met with diabetes coordinator and was given sample of FreeStyle Libre CGM prior to discharge.   AKI on CKD Stage 3a Patient's  Cr elevated to 2.03. Baseline appears to be ~1.5. She was treated with IV fluids and recommended to have close follow-up with her primary nephrologist at [facility].  Normocytic Anemia Patient noted to have normocytic anemia. Hgb stable at 8.7 on day of discharge. Recommend iron studies as an outpatient.  Tachycardia Patient tachycardic in the 110s throughout admission. Etiology possibly related to her anemia. Less likely pain, anxiety, or hypovolemia as it persisted despite treatment of these. This has reportedly been an ongoing problem for her and she was on Metoprolol in the past (unable to tolerate due to vision problems). Recommend checking TSH.  Issues for Follow Up:  1. Monitor renal function 2. Recommend iron studies 3. Recommend TSH 4. Ongoing management of her diabetes  Significant Procedures: None  Significant Labs and Imaging:  Recent Labs  Lab 10/29/20 1620 10/30/20 1507 10/30/20 1821 10/31/20 0151  WBC 5.0 5.4  --  4.8  HGB 9.5* 10.2* 9.5* 8.7*  HCT 28.0* 30.2* 28.0* 25.7*  PLT 391 373  --  345   Recent Labs  Lab 10/29/20 1620 10/30/20 1510 10/30/20 1821 10/31/20 0151  NA 128* 128* 128* 127*  K 4.9 4.8 5.5* 4.9  CL 97* 93*  --  94*  CO2 25 20*  --  19*  GLUCOSE 504* 548*  --  611*  BUN 23* 21*  --  26*  CREATININE 1.50* 1.70*  --  2.03*  CALCIUM 8.4* 8.8*  --  8.2*  ALKPHOS  --  66  --   --   AST  --  15  --   --   ALT  --  11  --   --   ALBUMIN  --  2.5*  --   --     Results/Tests Pending at Time of Discharge: None  Discharge Medications:  Allergies as of 10/31/2020  Reactions   Hydromorphone Hcl Itching, Rash      Medication List    STOP taking these medications   Accu-Chek Aviva Plus w/Device Kit   Accu-Chek Guide test strip Generic drug: glucose blood   fluconazole 100 MG tablet Commonly known as: DIFLUCAN   ondansetron 4 MG disintegrating tablet Commonly known as: Zofran ODT     TAKE these medications   acetaminophen  500 MG tablet Commonly known as: TYLENOL Take 500-1,000 mg by mouth every 6 (six) hours as needed for moderate pain.   chlorthalidone 25 MG tablet Commonly known as: HYGROTON Take 1 tablet (25 mg total) by mouth daily.   FreeStyle Libre 2 Reader Stebbins 1 each by Does not apply route in the morning, at noon, in the evening, and at bedtime.   gabapentin 600 MG tablet Commonly known as: NEURONTIN Take 600 mg by mouth 3 (three) times daily.   Lantus SoloStar 100 UNIT/ML Solostar Pen Generic drug: insulin glargine Inject 12 Units into the skin every morning. And pen needles 1/day What changed: how much to take   ondansetron 4 MG tablet Commonly known as: ZOFRAN Take 1 tablet (4 mg total) by mouth every 8 (eight) hours as needed for nausea or vomiting.   Pen Needles 30G X 8 MM Misc 1 each by Does not apply route daily. E11.9   traMADol 50 MG tablet Commonly known as: Ultram Take 1 tablet (50 mg total) by mouth 2 (two) times daily for 3 days.     ASK your doctor about these medications   FreeStyle Libre 2 Sensor Misc 1 each by Does not apply route once for 1 dose. Ask about: Should I take this medication?       Discharge Instructions: Please refer to Patient Instructions section of EMR for full details.  Patient was counseled important signs and symptoms that should prompt return to medical care, changes in medications, dietary instructions, activity restrictions, and follow up appointments.   Follow-Up Appointments:  Advised to follow up with PCP  Alcus Dad, MD 11/01/2020, 2:15 PM PGY-1, [place]

## 2020-10-31 NOTE — Progress Notes (Signed)
Received patient from ED awake,alert/orientedx4 and able to verbalize needs. VSS; NAD noted; respirations easy/even on room air. Movement/sensation noted to all extremities. Education provided on NPO diet. Whiteboard updated. Oriented to room and floor. All safety measures in place and personal belongings within reach.

## 2020-10-31 NOTE — Hospital Course (Signed)
FreeStyle Libre CGM with samples  FreeStyle Libre2 sensors  Provided

## 2020-10-31 NOTE — Progress Notes (Signed)
Critical CBG value collected. Paged team to update. Awaiting call back/ orders at this time.

## 2020-10-31 NOTE — Progress Notes (Signed)
Patient received AVS packet and education for d/c. Patient verbalized understanding and all questions answered at this time. Patient has all personal belongings at bedside. Tech escorted patient to ride via wheelchair for d/c.

## 2020-10-31 NOTE — Progress Notes (Signed)
Seen earlier this morning by Dr. Bobbye Morton, note pending.  Repeat scan obtained.  Her WBC has remained normal and she is afebrile.  Her follow up scan does not show any evidence of appendicitis or any other acute findings.  No surgical plans or indications for this patient.  Her diet can be advanced as tolerated from our standpoint.  We will sign off at this time.  Ann Robertson 2:55 PM 10/31/2020

## 2020-10-31 NOTE — Progress Notes (Signed)
Yellow MEWS populated. Discussed with provider, no new orders at this time.     10/30/20 2342  Vitals  Temp 98 F (36.7 C)  Temp Source Oral  BP (!) 176/107  MAP (mmHg) 127  BP Location Right Arm  BP Method Automatic  Patient Position (if appropriate) Lying  Pulse Rate (!) 116  Pulse Rate Source Dinamap  Resp 16  Level of Consciousness  Level of Consciousness Alert  MEWS COLOR  MEWS Score Color Yellow  Oxygen Therapy  SpO2 100 %  O2 Device Room Air  Pain Assessment  Pain Scale 0-10  Pain Score 10  Pain Type Acute pain  Pain Location Abdomen  Pain Orientation Mid  Pain Descriptors / Indicators Burning;Cramping;Sharp  Pain Frequency Constant  Pain Onset On-going  Pain Intervention(s) Medication (See eMAR)  Multiple Pain Sites No  Complaints & Interventions  Complains of Other (Comment) (pain)  Interventions Medication (see MAR);Patient refused medication  Patients response to intervention Decreased  Glasgow Coma Scale  Eye Opening 4  Best Verbal Response (NON-intubated) 5  Best Motor Response 6  Glasgow Coma Scale Score 15  MEWS Score  MEWS Temp 0  MEWS Systolic 0  MEWS Pulse 2  MEWS RR 0  MEWS LOC 0  MEWS Score 2  Provider Notification  Provider Name/Title Maness, Intern  Date Provider Notified 10/31/20  Time Provider Notified [phone]  Notification Type Page  Notification Reason Other (Comment) (yellow MEWS)  Test performed and critical result N/A  Provider response See new orders  Date of Provider Response 10/31/20  Time of Provider Response [phone]

## 2020-11-02 ENCOUNTER — Other Ambulatory Visit (INDEPENDENT_AMBULATORY_CARE_PROVIDER_SITE_OTHER): Payer: Self-pay

## 2020-11-03 ENCOUNTER — Encounter: Payer: Self-pay | Admitting: Family Medicine

## 2020-11-13 ENCOUNTER — Other Ambulatory Visit: Payer: Medicaid Other

## 2020-11-14 NOTE — Progress Notes (Incomplete)
Cardiology Office Note:    Date:  11/14/2020   ID:  Carroll Kinds, DOB 1991/03/11, MRN [id number]  PCP:  [facility] Providers Cardiologist:  None { Click to update primary MD,subspecialty MD or APP then REFRESH:1}    Referring MD: Madelon Lips, MD   No chief complaint on file. CC: Tachycardia ***  History of Present Illness:    Ann Robertson is a 30 y.o. female with a hx of T1DM, nephrotic syndrome, CKD stage IIIa, hypertension, anemia who is referred by Dr. Hollie Salk for evaluation of tachycardia.  She was admitted to [facility] from 6/2 through 10/31/20 with abdominal pain.  Outpatient CT abdomen concerning for early appendicitis, but repeat CT on admission showed normal appendix.  Noted to have significant hyperglycemia up to 500s during admission.  Also noted to have creatinine elevated to 2.0, with baseline appearing to be 1.5.  She is noted to be tachycardic to 110s throughout admission.  She had previously been on metoprolol for unexplained tachycardia which was discontinued due to vision issues.  Today,  She denies any chest pain, shortness of breath, palpitations, or exertional symptoms. No headaches, lightheadedness, or syncope to report. Also has no lower extremity edema, orthopnea or PND.   Past Medical History:  Diagnosis Date   Diabetes mellitus    High cholesterol    HSV-2 infection    Juvenile diabetes 2000   Juvenile diabetes mellitus    Neuropathy    Preeclampsia    STD (sexually transmitted disease)     Past Surgical History:  Procedure Laterality Date   CESAREAN SECTION     CESAREAN SECTION N/A 07/05/2013   Procedure: CESAREAN SECTION;  Surgeon: Emily Filbert, MD;  Location: [facility];  Service: Obstetrics;  Laterality: N/A;   CESAREAN SECTION N/A 07/25/2014   Procedure: CESAREAN SECTION;  Surgeon: Donnamae Jude, MD;  Location: [facility];  Service: Obstetrics;  Laterality: N/A;   CESAREAN SECTION N/A 11/21/2015   Procedure: CESAREAN  SECTION;  Surgeon: Donnamae Jude, MD;  Location: [facility];  Service: Obstetrics;  Laterality: N/A;   WISDOM TOOTH EXTRACTION      Current Medications: No outpatient medications have been marked as taking for the 11/17/20 encounter (Appointment) with Donato Heinz, MD.     Allergies:   Hydromorphone hcl   Social History   Socioeconomic History   Marital status: Single    Spouse name: Not on file   Number of children: Not on file   Years of education: Not on file   Highest education level: Not on file  Occupational History   Not on file  Tobacco Use   Smoking status: Every Day    Packs/day: 0.25    Years: 9.00    Pack years: 2.25    Types: Cigarettes   Smokeless tobacco: Never  Vaping Use   Vaping Use: Not on file  Substance and Sexual Activity   Alcohol use: Yes    Comment: occasional   Drug use: No   Sexual activity: Not Currently    Birth control/protection: None  Other Topics Concern   Not on file  Social History Narrative   Not on file   Social Determinants of Health   Financial Resource Strain: Not on file  Food Insecurity: Not on file  Transportation Needs: Not on file  Physical Activity: Not on file  Stress: Not on file  Social Connections: Not on file     Family History: The patient's family  history includes Diabetes in her father; Heart disease in her mother; Hyperlipidemia in her father; Hypertension in her mother; Kidney disease in her mother; Stroke in her mother.  ROS:   Please see the history of present illness.    (+) All other systems reviewed and are negative.  EKGs/Labs/Other Studies Reviewed:    The following studies were reviewed today:   EKG:   11/17/2020: ***  Recent Labs: 09/10/2020: TSH 3.09 10/30/2020: ALT 11 10/31/2020: BUN 26; Creatinine, Ser 2.03; Hemoglobin 8.7; Platelets 345; Potassium 4.9; Sodium 127  Recent Lipid Panel No results found for: CHOL, TRIG, HDL, CHOLHDL, VLDL, LDLCALC, LDLDIRECT   Risk  Assessment/Calculations:   {Does this patient have ATRIAL FIBRILLATION?:[phone]}       Physical Exam:    VS:  There were no vitals taken for this visit.    Wt Readings from Last 3 Encounters:  10/30/20 105 lb (47.6 kg)  10/29/20 105 lb (47.6 kg)  09/12/20 112 lb 9.6 oz (51.1 kg)     GEN: Well nourished, well developed in no acute distress HEENT: Normal NECK: No JVD; No carotid bruits LYMPHATICS: No lymphadenopathy CARDIAC: RRR, no murmurs, rubs, gallops RESPIRATORY:  Clear to auscultation without rales, wheezing or rhonchi  ABDOMEN: Soft, non-tender, non-distended MUSCULOSKELETAL:  No edema; No deformity  SKIN: Warm and dry NEUROLOGIC:  Alert and oriented x 3 PSYCHIATRIC:  Normal affect   ASSESSMENT:    No diagnosis found. PLAN:    Tachycardia:  T1DM:  CKD stage III AAA: Creatinine 2.0 during recent admission, with baseline about 1.5.  Follows with nephrology.  Anemia: Hemoglobin 8.7 on day of discharge.  Needs iron studies.  RTC in***   Medication Adjustments/Labs and Tests Ordered: Current medicines are reviewed at length with the patient today.  Concerns regarding medicines are outlined above.  No orders of the defined types were placed in this encounter.  No orders of the defined types were placed in this encounter.   There are no Patient Instructions on file for this visit.   I,Mathew Stumpf,acting as a Education administrator for Donato Heinz, MD.,have documented all relevant documentation on the behalf of Donato Heinz, MD,as directed by  Donato Heinz, MD while in the presence of Donato Heinz, MD.  ***  Signed, Madelin Rear  11/14/2020 2:07 PM    [facility]

## 2020-11-17 ENCOUNTER — Ambulatory Visit: Payer: Medicaid Other | Admitting: Cardiology

## 2020-11-26 ENCOUNTER — Encounter: Payer: Self-pay | Admitting: Cardiology

## 2020-11-26 NOTE — Progress Notes (Deleted)
Cardiology Office Note   Date:  11/26/2020   ID:  Ann Robertson, DOB [birth year], MRN [id number]  PCP:  [facility]  Cardiologist:   None Referring:  ***  No chief complaint on file.     History of Present Illness: Ann Robertson is a 30 y.o. female who is referred by *** for evaluation of tachycardia.  ***   Past Medical History:  Diagnosis Date   Diabetes mellitus    High cholesterol    HSV-2 infection    Juvenile diabetes 2000   Juvenile diabetes mellitus    Neuropathy    Preeclampsia    STD (sexually transmitted disease)     Past Surgical History:  Procedure Laterality Date   CESAREAN SECTION     CESAREAN SECTION N/A 07/05/2013   Procedure: CESAREAN SECTION;  Surgeon: Emily Filbert, MD;  Location: [facility];  Service: Obstetrics;  Laterality: N/A;   CESAREAN SECTION N/A 07/25/2014   Procedure: CESAREAN SECTION;  Surgeon: Donnamae Jude, MD;  Location: [facility];  Service: Obstetrics;  Laterality: N/A;   CESAREAN SECTION N/A 11/21/2015   Procedure: CESAREAN SECTION;  Surgeon: Donnamae Jude, MD;  Location: [facility];  Service: Obstetrics;  Laterality: N/A;   WISDOM TOOTH EXTRACTION       Current Outpatient Medications  Medication Sig Dispense Refill   acetaminophen (TYLENOL) 500 MG tablet Take 500-1,000 mg by mouth every 6 (six) hours as needed for moderate pain.     chlorthalidone (HYGROTON) 25 MG tablet Take 1 tablet (25 mg total) by mouth daily. 30 tablet 0   Continuous Blood Gluc Receiver (FREESTYLE LIBRE 2 READER) DEVI 1 each by Does not apply route in the morning, at noon, in the evening, and at bedtime. 1 each 0   gabapentin (NEURONTIN) 600 MG tablet Take 600 mg by mouth 3 (three) times daily.     insulin glargine (LANTUS SOLOSTAR) 100 UNIT/ML Solostar Pen Inject 12 Units into the skin every morning. And pen needles 1/day 15 mL 3   Insulin Pen Needle (PEN NEEDLES) 30G X 8 MM MISC 1 each by Does not apply route daily. E11.9 90 each 0    ondansetron (ZOFRAN) 4 MG tablet Take 1 tablet (4 mg total) by mouth every 8 (eight) hours as needed for nausea or vomiting. 20 tablet 0   No current facility-administered medications for this visit.    Allergies:   Hydromorphone hcl    Social History:  The patient  reports that she has been smoking cigarettes. She has a 2.25 pack-year smoking history. She has never used smokeless tobacco. She reports current alcohol use. She reports that she does not use drugs.   Family History:  The patient's ***family history includes Diabetes in her father; Heart disease in her mother; Hyperlipidemia in her father; Hypertension in her mother; Kidney disease in her mother; Stroke in her mother.    ROS:  Please see the history of present illness.   Otherwise, review of systems are positive for {NONE DEFAULTED:18576}.   All other systems are reviewed and negative.    PHYSICAL EXAM: VS:  There were no vitals taken for this visit. , BMI There is no height or weight on file to calculate BMI. GENERAL:  Well appearing HEENT:  Pupils equal round and reactive, fundi not visualized, oral mucosa unremarkable NECK:  No jugular venous distention, waveform within normal limits, carotid upstroke brisk and symmetric, no bruits, no thyromegaly LYMPHATICS:  No cervical, inguinal adenopathy LUNGS:  Clear to auscultation bilaterally BACK:  No CVA tenderness CHEST:  Unremarkable HEART:  PMI not displaced or sustained,S1 and S2 within normal limits, no S3, no S4, no clicks, no rubs, *** murmurs ABD:  Flat, positive bowel sounds normal in frequency in pitch, no bruits, no rebound, no guarding, no midline pulsatile mass, no hepatomegaly, no splenomegaly EXT:  2 plus pulses throughout, no edema, no cyanosis no clubbing SKIN:  No rashes no nodules NEURO:  Cranial nerves II through XII grossly intact, motor grossly intact throughout PSYCH:  Cognitively intact, oriented to person place and time    EKG:  EKG {ACTION; IS/IS  VG:[id number] ordered today. The ekg ordered today demonstrates ***   Recent Labs: 09/10/2020: TSH 3.09 10/30/2020: ALT 11 10/31/2020: BUN 26; Creatinine, Ser 2.03; Hemoglobin 8.7; Platelets 345; Potassium 4.9; Sodium 127    Lipid Panel No results found for: CHOL, TRIG, HDL, CHOLHDL, VLDL, LDLCALC, LDLDIRECT    Wt Readings from Last 3 Encounters:  10/30/20 105 lb (47.6 kg)  10/29/20 105 lb (47.6 kg)  09/12/20 112 lb 9.6 oz (51.1 kg)      Other studies Reviewed: Additional studies/ records that were reviewed today include: ***. Review of the above records demonstrates:  Please see elsewhere in the note.  ***   ASSESSMENT AND PLAN:  TACHYCARDIA:  ***  DM:  ***  CKD IIIa:  ***  ANEMIA:  ***   Current medicines are reviewed at length with the patient today.  The patient {ACTIONS; HAS/DOES NOT HAVE:19233} concerns regarding medicines.  The following changes have been made:  {PLAN; NO CHANGE:13088:s}  Labs/ tests ordered today include: *** No orders of the defined types were placed in this encounter.    Disposition:   FU with ***    Signed, Minus Breeding, MD  11/26/2020 6:32 PM    [facility]

## 2020-11-27 ENCOUNTER — Ambulatory Visit: Payer: Medicaid Other | Admitting: Cardiology

## 2020-12-08 ENCOUNTER — Telehealth: Payer: Self-pay | Admitting: Nutrition

## 2020-12-08 NOTE — Telephone Encounter (Signed)
Message left on machine that I am needing to reschedule appointment on the 18th.  Times given for next available appointment with me on the 27th, and number given to call me to schedule this.

## 2020-12-16 ENCOUNTER — Ambulatory Visit: Payer: Medicaid Other | Admitting: Nutrition

## 2020-12-25 ENCOUNTER — Ambulatory Visit: Payer: Medicaid Other | Admitting: Nutrition

## 2020-12-29 ENCOUNTER — Ambulatory Visit: Payer: Medicaid Other | Admitting: Nutrition

## 2021-01-21 ENCOUNTER — Telehealth: Payer: Self-pay | Admitting: Nutrition

## 2021-01-21 NOTE — Telephone Encounter (Signed)
Appointment made for 9/6 to discuss insulin pump therapy and CGM.

## 2021-01-26 ENCOUNTER — Inpatient Hospital Stay (HOSPITAL_COMMUNITY)
Admission: EM | Admit: 2021-01-26 | Discharge: 2021-02-02 | DRG: 638 | Disposition: A | Discharge status: Home / Self Care | Payer: Medicaid Other | Attending: Internal Medicine | Admitting: Internal Medicine

## 2021-01-26 ENCOUNTER — Encounter (HOSPITAL_COMMUNITY): Payer: Self-pay | Admitting: Emergency Medicine

## 2021-01-26 ENCOUNTER — Emergency Department (HOSPITAL_COMMUNITY): Payer: Medicaid Other

## 2021-01-26 ENCOUNTER — Other Ambulatory Visit: Payer: Self-pay

## 2021-01-26 DIAGNOSIS — E1022 Type 1 diabetes mellitus with diabetic chronic kidney disease: Secondary | ICD-10-CM | POA: Diagnosis present

## 2021-01-26 DIAGNOSIS — E1042 Type 1 diabetes mellitus with diabetic polyneuropathy: Secondary | ICD-10-CM | POA: Diagnosis present

## 2021-01-26 DIAGNOSIS — G8929 Other chronic pain: Secondary | ICD-10-CM | POA: Diagnosis present

## 2021-01-26 DIAGNOSIS — D649 Anemia, unspecified: Secondary | ICD-10-CM | POA: Diagnosis present

## 2021-01-26 DIAGNOSIS — Z794 Long term (current) use of insulin: Secondary | ICD-10-CM

## 2021-01-26 DIAGNOSIS — I129 Hypertensive chronic kidney disease with stage 1 through stage 4 chronic kidney disease, or unspecified chronic kidney disease: Secondary | ICD-10-CM | POA: Diagnosis present

## 2021-01-26 DIAGNOSIS — Z91128 Patient's intentional underdosing of medication regimen for other reason: Secondary | ICD-10-CM | POA: Diagnosis not present

## 2021-01-26 DIAGNOSIS — Y92230 Patient room in hospital as the place of occurrence of the external cause: Secondary | ICD-10-CM | POA: Diagnosis not present

## 2021-01-26 DIAGNOSIS — I10 Essential (primary) hypertension: Secondary | ICD-10-CM

## 2021-01-26 DIAGNOSIS — N1831 Chronic kidney disease, stage 3a: Secondary | ICD-10-CM | POA: Diagnosis present

## 2021-01-26 DIAGNOSIS — E8809 Other disorders of plasma-protein metabolism, not elsewhere classified: Secondary | ICD-10-CM | POA: Diagnosis present

## 2021-01-26 DIAGNOSIS — I160 Hypertensive urgency: Secondary | ICD-10-CM | POA: Diagnosis not present

## 2021-01-26 DIAGNOSIS — Z79891 Long term (current) use of opiate analgesic: Secondary | ICD-10-CM

## 2021-01-26 DIAGNOSIS — I951 Orthostatic hypotension: Secondary | ICD-10-CM | POA: Diagnosis not present

## 2021-01-26 DIAGNOSIS — Z681 Body mass index (BMI) 19 or less, adult: Secondary | ICD-10-CM | POA: Diagnosis not present

## 2021-01-26 DIAGNOSIS — E1110 Type 2 diabetes mellitus with ketoacidosis without coma: Secondary | ICD-10-CM | POA: Diagnosis present

## 2021-01-26 DIAGNOSIS — Z98891 History of uterine scar from previous surgery: Secondary | ICD-10-CM | POA: Diagnosis not present

## 2021-01-26 DIAGNOSIS — R1013 Epigastric pain: Secondary | ICD-10-CM | POA: Diagnosis present

## 2021-01-26 DIAGNOSIS — Z20822 Contact with and (suspected) exposure to covid-19: Secondary | ICD-10-CM | POA: Diagnosis present

## 2021-01-26 DIAGNOSIS — E1040 Type 1 diabetes mellitus with diabetic neuropathy, unspecified: Secondary | ICD-10-CM

## 2021-01-26 DIAGNOSIS — F17210 Nicotine dependence, cigarettes, uncomplicated: Secondary | ICD-10-CM | POA: Diagnosis present

## 2021-01-26 DIAGNOSIS — Z9114 Patient's other noncompliance with medication regimen: Secondary | ICD-10-CM

## 2021-01-26 DIAGNOSIS — N179 Acute kidney failure, unspecified: Secondary | ICD-10-CM | POA: Insufficient documentation

## 2021-01-26 DIAGNOSIS — E1010 Type 1 diabetes mellitus with ketoacidosis without coma: Secondary | ICD-10-CM | POA: Diagnosis present

## 2021-01-26 DIAGNOSIS — E7800 Pure hypercholesterolemia, unspecified: Secondary | ICD-10-CM | POA: Diagnosis present

## 2021-01-26 DIAGNOSIS — T383X6A Underdosing of insulin and oral hypoglycemic [antidiabetic] drugs, initial encounter: Secondary | ICD-10-CM | POA: Diagnosis present

## 2021-01-26 DIAGNOSIS — T402X5A Adverse effect of other opioids, initial encounter: Secondary | ICD-10-CM | POA: Diagnosis not present

## 2021-01-26 DIAGNOSIS — R636 Underweight: Secondary | ICD-10-CM | POA: Diagnosis present

## 2021-01-26 DIAGNOSIS — G629 Polyneuropathy, unspecified: Secondary | ICD-10-CM

## 2021-01-26 DIAGNOSIS — Z8249 Family history of ischemic heart disease and other diseases of the circulatory system: Secondary | ICD-10-CM

## 2021-01-26 DIAGNOSIS — Z83438 Family history of other disorder of lipoprotein metabolism and other lipidemia: Secondary | ICD-10-CM

## 2021-01-26 DIAGNOSIS — Z841 Family history of disorders of kidney and ureter: Secondary | ICD-10-CM

## 2021-01-26 DIAGNOSIS — R601 Generalized edema: Secondary | ICD-10-CM

## 2021-01-26 DIAGNOSIS — Z833 Family history of diabetes mellitus: Secondary | ICD-10-CM

## 2021-01-26 DIAGNOSIS — N189 Chronic kidney disease, unspecified: Secondary | ICD-10-CM

## 2021-01-26 DIAGNOSIS — Z885 Allergy status to narcotic agent status: Secondary | ICD-10-CM

## 2021-01-26 DIAGNOSIS — E1021 Type 1 diabetes mellitus with diabetic nephropathy: Secondary | ICD-10-CM | POA: Diagnosis not present

## 2021-01-26 DIAGNOSIS — Z79899 Other long term (current) drug therapy: Secondary | ICD-10-CM

## 2021-01-26 DIAGNOSIS — R1900 Intra-abdominal and pelvic swelling, mass and lump, unspecified site: Secondary | ICD-10-CM

## 2021-01-26 LAB — CBC WITH DIFFERENTIAL/PLATELET
Abs Immature Granulocytes: 0.02 10*3/uL (ref 0.00–0.07)
Basophils Absolute: 0 10*3/uL (ref 0.0–0.1)
Basophils Relative: 0 %
Eosinophils Absolute: 0 10*3/uL (ref 0.0–0.5)
Eosinophils Relative: 0 %
HCT: 29.7 % — ABNORMAL LOW (ref 36.0–46.0)
Hemoglobin: 9.5 g/dL — ABNORMAL LOW (ref 12.0–15.0)
Immature Granulocytes: 0 %
Lymphocytes Relative: 27 %
Lymphs Abs: 1.6 10*3/uL (ref 0.7–4.0)
MCH: 29.3 pg (ref 26.0–34.0)
MCHC: 32 g/dL (ref 30.0–36.0)
MCV: 91.7 fL (ref 80.0–100.0)
Monocytes Absolute: 0.2 10*3/uL (ref 0.1–1.0)
Monocytes Relative: 3 %
Neutro Abs: 4.1 10*3/uL (ref 1.7–7.7)
Neutrophils Relative %: 70 %
Platelets: 331 10*3/uL (ref 150–400)
RBC: 3.24 MIL/uL — ABNORMAL LOW (ref 3.87–5.11)
RDW: 13.7 % (ref 11.5–15.5)
WBC: 6 10*3/uL (ref 4.0–10.5)
nRBC: 0 % (ref 0.0–0.2)

## 2021-01-26 LAB — GLUCOSE, CAPILLARY: Glucose-Capillary: 270 mg/dL — ABNORMAL HIGH (ref 70–99)

## 2021-01-26 LAB — TROPONIN I (HIGH SENSITIVITY)
Troponin I (High Sensitivity): 4 ng/L (ref <18)
Troponin I (High Sensitivity): 5 ng/L (ref <18)

## 2021-01-26 LAB — COMPREHENSIVE METABOLIC PANEL
ALT: 15 U/L (ref 0–44)
AST: 17 U/L (ref 15–41)
Albumin: 2.2 g/dL — ABNORMAL LOW (ref 3.5–5.0)
Alkaline Phosphatase: 87 U/L (ref 38–126)
Anion gap: 23 — ABNORMAL HIGH (ref 5–15)
BUN: 41 mg/dL — ABNORMAL HIGH (ref 6–20)
CO2: 11 mmol/L — ABNORMAL LOW (ref 22–32)
Calcium: 8.2 mg/dL — ABNORMAL LOW (ref 8.9–10.3)
Chloride: 95 mmol/L — ABNORMAL LOW (ref 98–111)
Creatinine, Ser: 2.35 mg/dL — ABNORMAL HIGH (ref 0.44–1.00)
GFR, Estimated: 28 mL/min — ABNORMAL LOW (ref >60)
Glucose, Bld: 774 mg/dL (ref 70–99)
Potassium: 4 mmol/L (ref 3.5–5.1)
Sodium: 129 mmol/L — ABNORMAL LOW (ref 135–145)
Total Bilirubin: 1.6 mg/dL — ABNORMAL HIGH (ref 0.3–1.2)
Total Protein: 5.8 g/dL — ABNORMAL LOW (ref 6.5–8.1)

## 2021-01-26 LAB — CBG MONITORING, ED
Glucose-Capillary: >600 mg/dL (ref 70–99)
Glucose-Capillary: >600 mg/dL (ref 70–99)
Glucose-Capillary: 561 mg/dL (ref 70–99)
Glucose-Capillary: 494 mg/dL — ABNORMAL HIGH (ref 70–99)
Glucose-Capillary: 451 mg/dL — ABNORMAL HIGH (ref 70–99)
Glucose-Capillary: 361 mg/dL — ABNORMAL HIGH (ref 70–99)

## 2021-01-26 LAB — BETA-HYDROXYBUTYRIC ACID
Beta-Hydroxybutyric Acid: >8 mmol/L — ABNORMAL HIGH (ref 0.05–0.27)
Beta-Hydroxybutyric Acid: 2.8 mmol/L — ABNORMAL HIGH (ref 0.05–0.27)

## 2021-01-26 LAB — I-STAT BETA HCG BLOOD, ED (MC, WL, AP ONLY): I-stat hCG, quantitative: 7.7 m[IU]/mL — ABNORMAL HIGH (ref <5)

## 2021-01-26 LAB — BLOOD GAS, VENOUS
Acid-base deficit: 14.8 mmol/L — ABNORMAL HIGH (ref 0.0–2.0)
Bicarbonate: 11.1 mmol/L — ABNORMAL LOW (ref 20.0–28.0)
O2 Saturation: 97.6 %
Patient temperature: 98.6
pCO2, Ven: 26.9 mmHg — ABNORMAL LOW (ref 44.0–60.0)
pH, Ven: 7.24 — ABNORMAL LOW (ref 7.250–7.430)
pO2, Ven: 110 mmHg — ABNORMAL HIGH (ref 32.0–45.0)

## 2021-01-26 LAB — HCG, QUANTITATIVE, PREGNANCY: hCG, Beta Chain, Quant, S: 2 m[IU]/mL (ref <5)

## 2021-01-26 LAB — URINALYSIS, ROUTINE W REFLEX MICROSCOPIC
Bilirubin Urine: NEGATIVE
Glucose, UA: >=500 mg/dL — AB
Ketones, ur: 80 mg/dL — AB
Leukocytes,Ua: NEGATIVE
Nitrite: NEGATIVE
Protein, ur: >=300 mg/dL — AB
Specific Gravity, Urine: 1.017 (ref 1.005–1.030)
pH: 5 (ref 5.0–8.0)

## 2021-01-26 LAB — BASIC METABOLIC PANEL
Anion gap: 11 (ref 5–15)
BUN: 35 mg/dL — ABNORMAL HIGH (ref 6–20)
CO2: 22 mmol/L (ref 22–32)
Calcium: 8.1 mg/dL — ABNORMAL LOW (ref 8.9–10.3)
Chloride: 104 mmol/L (ref 98–111)
Creatinine, Ser: 2 mg/dL — ABNORMAL HIGH (ref 0.44–1.00)
GFR, Estimated: 33 mL/min — ABNORMAL LOW (ref >60)
Glucose, Bld: 379 mg/dL — ABNORMAL HIGH (ref 70–99)
Potassium: 4 mmol/L (ref 3.5–5.1)
Sodium: 137 mmol/L (ref 135–145)

## 2021-01-26 LAB — RESP PANEL BY RT-PCR (FLU A&B, COVID) ARPGX2
Influenza A by PCR: NEGATIVE
Influenza B by PCR: NEGATIVE
SARS Coronavirus 2 by RT PCR: NEGATIVE

## 2021-01-26 LAB — LIPASE, BLOOD: Lipase: 25 U/L (ref 11–51)

## 2021-01-26 LAB — MAGNESIUM: Magnesium: 1.9 mg/dL (ref 1.7–2.4)

## 2021-01-26 MED ORDER — SODIUM CHLORIDE 0.9 % IV BOLUS
1000.0000 mL | Freq: Once | INTRAVENOUS | Status: AC
  Administered 2021-01-26: 1000 mL via INTRAVENOUS

## 2021-01-26 MED ORDER — FENTANYL CITRATE PF 50 MCG/ML IJ SOSY
50.0000 ug | PREFILLED_SYRINGE | INTRAMUSCULAR | Status: DC | PRN
Start: 2021-01-26 — End: 2021-01-26

## 2021-01-26 MED ORDER — CHLORHEXIDINE GLUCONATE CLOTH 2 % EX PADS
6.0000 | MEDICATED_PAD | Freq: Every day | CUTANEOUS | Status: DC
  Administered 2021-01-27 – 2021-02-01 (×4): 6 via TOPICAL

## 2021-01-26 MED ORDER — LACTATED RINGERS IV SOLN: INTRAVENOUS | Status: DC

## 2021-01-26 MED ORDER — ENOXAPARIN SODIUM 30 MG/0.3ML IJ SOSY: 30.0000 mg | PREFILLED_SYRINGE | INTRAMUSCULAR | Status: DC

## 2021-01-26 MED ORDER — FENTANYL CITRATE (PF) 100 MCG/2ML IJ SOLN
50.0000 ug | INTRAMUSCULAR | Status: DC | PRN
  Administered 2021-01-26 – 2021-01-27 (×4): 50 ug via INTRAVENOUS
  Filled 2021-01-26 (×4): qty 2

## 2021-01-26 MED ORDER — FENTANYL CITRATE PF 50 MCG/ML IJ SOSY: 25.0000 ug | PREFILLED_SYRINGE | INTRAMUSCULAR | Status: DC | PRN

## 2021-01-26 MED ORDER — FENTANYL CITRATE PF 50 MCG/ML IJ SOSY
50.0000 ug | PREFILLED_SYRINGE | Freq: Once | INTRAMUSCULAR | Status: AC
Start: 2021-01-26 — End: 2021-01-26
  Administered 2021-01-26: 50 ug via INTRAVENOUS
  Filled 2021-01-26: qty 1

## 2021-01-26 MED ORDER — MORPHINE SULFATE (PF) 2 MG/ML IV SOLN
2.0000 mg | Freq: Once | INTRAVENOUS | Status: AC
Start: 2021-01-26 — End: 2021-01-26
  Administered 2021-01-26: 2 mg via INTRAVENOUS
  Filled 2021-01-26: qty 1

## 2021-01-26 MED ORDER — FENTANYL CITRATE PF 50 MCG/ML IJ SOSY: 50.0000 ug | PREFILLED_SYRINGE | INTRAMUSCULAR | Status: DC | PRN

## 2021-01-26 MED ORDER — DEXTROSE 50 % IV SOLN
0.0000 mL | INTRAVENOUS | Status: DC | PRN
  Administered 2021-01-28: 50 mL via INTRAVENOUS
  Filled 2021-01-26: qty 50

## 2021-01-26 MED ORDER — POTASSIUM CHLORIDE 10 MEQ/100ML IV SOLN
10.0000 meq | INTRAVENOUS | Status: AC
  Administered 2021-01-26 (×2): 10 meq via INTRAVENOUS
  Filled 2021-01-26 (×2): qty 100

## 2021-01-26 MED ORDER — INSULIN REGULAR(HUMAN) IN NACL 100-0.9 UT/100ML-% IV SOLN
INTRAVENOUS | Status: DC
  Administered 2021-01-26: 4.2 [IU]/h via INTRAVENOUS
  Filled 2021-01-26: qty 100

## 2021-01-26 MED ORDER — FENTANYL CITRATE (PF) 100 MCG/2ML IJ SOLN: 25.0000 ug | INTRAMUSCULAR | Status: DC | PRN

## 2021-01-26 MED ORDER — ONDANSETRON HCL 4 MG/2ML IJ SOLN
4.0000 mg | Freq: Four times a day (QID) | INTRAMUSCULAR | Status: DC | PRN
  Administered 2021-01-27 – 2021-01-29 (×3): 4 mg via INTRAVENOUS
  Filled 2021-01-26 (×4): qty 2

## 2021-01-26 MED ORDER — FENTANYL CITRATE PF 50 MCG/ML IJ SOSY
25.0000 ug | PREFILLED_SYRINGE | INTRAMUSCULAR | Status: DC | PRN
Start: 2021-01-26 — End: 2021-01-26

## 2021-01-26 MED ORDER — FENTANYL CITRATE PF 50 MCG/ML IJ SOSY
25.0000 ug | PREFILLED_SYRINGE | Freq: Once | INTRAMUSCULAR | Status: AC
Start: 2021-01-26 — End: 2021-01-26
  Administered 2021-01-26: 25 ug via INTRAVENOUS
  Filled 2021-01-26: qty 1

## 2021-01-26 MED ORDER — ENOXAPARIN SODIUM 40 MG/0.4ML IJ SOSY: 40.0000 mg | PREFILLED_SYRINGE | INTRAMUSCULAR | Status: DC

## 2021-01-26 MED ORDER — DEXTROSE IN LACTATED RINGERS 5 % IV SOLN: INTRAVENOUS | Status: DC

## 2021-01-26 NOTE — ED Notes (Signed)
After administration of IV morphine, pt became tachypneic and unresponsive  to RN or PA when stimulated. Pt became responsive after a couple seconds of simulation and was a&o x 4. All vitals remained WNL during episode. PA at bedside and aware of episode.

## 2021-01-26 NOTE — ED Notes (Addendum)
RN entered room to assist pt to use the restroom per request. RN gave pt multiple options on how RN could assist pt to use the restroom including bed pan, purewick and BSC. Pt refused to allow RN to assist her to the restroom until her husband was in the room. RN brought husband to room. Pt again refused to allow RN to assit her to the restroom. Pt speaking rudely to staff, requesting a new nurse and recording the interactions on her phone. RN informed pt that recording medication peronel is not allowed, and kindly asked pt to stop. Pt refused to stop recording. Charge RN made aware.

## 2021-01-26 NOTE — ED Notes (Signed)
Pt RN has been notify about her BP is High.

## 2021-01-26 NOTE — ED Notes (Signed)
AC at bedside

## 2021-01-26 NOTE — ED Notes (Signed)
Pt refusing 2nd IV. RN educated pt on the importance of having a 2nd IV due to [place] having to administer multiple medications at once. Pt states she only wants to be straight stuck. PA called to bedside. Pt agreed to have 2nd IV placed after speaking with PA. Upon IV insertion, pt stated the IV hurt and that she wanted it to be removed. RN encouraged pt to have IV remain in arm to be able to provide pt with necessary medications. Pt stated "I'll just remove the IV from my arm myself". RN encouraged pt to not do that. PA made aware.

## 2021-01-26 NOTE — H&P (Addendum)
History and Physical    Jaliah Wrice [id number] DOB: 05/09/1991 DOA: 01/26/2021  PCP: [facility] [place]  Patient coming from: Home  I have personally briefly reviewed patient's old medical records in [place]  Chief Complaint: abdominal pain  HPI: Gertrudis Roe is a 30 y.o. female with medical history significant for type 1 diabetes with peripheral neuropathy, nephrotic syndrome, CKD stage IIIa, hypertension, and anemia who presents with abdominal pain.  Patient reports that she awoke this morning feeling short of breath with severe epigastric abdominal pain. Pain has been chronic for years and states she has been told by her PCP that it was due to her anemia. Pain comes and goes and is sharp, crampy and dull. Pain worse after she urinates but no dysuria. Feels nauseous but no vomiting. Has only been able to eat 3 grapes today. No diarrhea. No fever. Reports she is on a regimen of 7 Units of Lantus daily with sliding scale. Takes 7 units "whenever she feels like it" during the day. Glucometer often reads > 500 and sometimes in the 200-400s.  Hx of 4 C-sections in the past.  Reports tobacco use of 3-4 cigarettes per day, no alcohol or illicit drug use.  ED Course: He was afebrile, mildly tachycardic and hypertensive up to systolic of [id number].  No leukocytosis.  Hemoglobin of 9.5 around her baseline.  Sodium 129.  Blood glucose of 774, pH 7.24, bicarb of 11, gap of 23 Creatinine elevated 2.35 from baseline of about 1.4    Review of Systems: Constitutional: No Weight Change, No Fever ENT/Mouth: No sore throat, No Rhinorrhea Eyes: o Vision Changes Cardiovascular: No Chest Pain, no SOB Respiratory: No Cough, No Sputum Gastrointestinal: + Nausea, No Vomiting, No Diarrhea, No Constipation, + Pain Genitourinary: no Urinary Incontinence, No Urgency, No Flank Pain Musculoskeletal: No Arthralgias, No Myalgias Skin: No Skin Lesions, No Pruritus, Neuro: no Weakness, No  Numbness Psych: No Anxiety/Panic, No Depression, + decrease appetite Heme/Lymph: No Bruising, No Bleeding   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   Neuropathy    Preeclampsia    STD (sexually transmitted disease)     Past Surgical History:  Procedure Laterality Date   CESAREAN SECTION     CESAREAN SECTION N/A 07/05/2013   Procedure: CESAREAN SECTION;  Surgeon: Emily Filbert, MD;  Location: [facility];  Service: Obstetrics;  Laterality: N/A;   CESAREAN SECTION N/A 07/25/2014   Procedure: CESAREAN SECTION;  Surgeon: Donnamae Jude, MD;  Location: [facility];  Service: Obstetrics;  Laterality: N/A;   CESAREAN SECTION N/A 11/21/2015   Procedure: CESAREAN SECTION;  Surgeon: Donnamae Jude, MD;  Location: [facility];  Service: Obstetrics;  Laterality: N/A;   WISDOM TOOTH EXTRACTION       reports that she has been smoking cigarettes. She has a 2.25 pack-year smoking history. She has never used smokeless tobacco. She reports current alcohol use. She reports that she does not use drugs. Social History  Allergies  Allergen Reactions   Hydromorphone Hcl Itching and Rash    Family History  Problem Relation Age of Onset   Stroke Mother    Hypertension Mother    Heart disease Mother    Kidney disease Mother    Hyperlipidemia Father    Diabetes Father      Prior to Admission medications   Medication Sig Start Date End Date Taking? Authorizing Provider  acetaminophen (TYLENOL)  500 MG tablet Take 500-1,000 mg by mouth every 6 (six) hours as needed for moderate pain.    [provider]  chlorthalidone (HYGROTON) 25 MG tablet Take 1 tablet (25 mg total) by mouth daily. 10/29/20   Loni Beckwith, PA-C  Continuous Blood Gluc Receiver (FREESTYLE LIBRE 2 READER) DEVI 1 each by Does not apply route in the morning, at noon, in the evening, and at bedtime. 10/31/20   Brimage, Ronnette Juniper, DO  gabapentin (NEURONTIN) 600 MG  tablet Take 600 mg by mouth 3 (three) times daily. 10/28/20   [provider]  insulin glargine (LANTUS SOLOSTAR) 100 UNIT/ML Solostar Pen Inject 12 Units into the skin every morning. And pen needles 1/day 10/31/20   Brimage, Ronnette Juniper, DO  Insulin Pen Needle (PEN NEEDLES) 30G X 8 MM MISC 1 each by Does not apply route daily. E11.9 05/14/19   Renato Shin, MD  ondansetron (ZOFRAN) 4 MG tablet Take 1 tablet (4 mg total) by mouth every 8 (eight) hours as needed for nausea or vomiting. 06/10/20   Renato Shin, MD    Physical Exam: Vitals:   01/26/21 1700 01/26/21 1708 01/26/21 1730 01/26/21 1845  BP:  (!) 176/117 (!) 152/117 (!) 199/116  Pulse:  (!) 108 (!) 101 (!) 101  Resp: (!) '29 16 17 18  '$ Temp:      TempSrc:      SpO2:  100% 100% 94%  Weight:      Height:        Constitutional: NAD, thin young female laying at approximately 40 degree incline in bed in discomfort and or wincing due to abdominal pain at times. Vitals:   01/26/21 1700 01/26/21 1708 01/26/21 1730 01/26/21 1845  BP:  (!) 176/117 (!) 152/117 (!) 199/116  Pulse:  (!) 108 (!) 101 (!) 101  Resp: (!) '29 16 17 18  '$ Temp:      TempSrc:      SpO2:  100% 100% 94%  Weight:      Height:       Eyes: PERRL, lids and conjunctivae normal ENMT: Mucous membranes are moist.  Neck: normal, supple Respiratory: clear to auscultation bilaterally, no wheezing, no crackles. Normal respiratory effort. No accessory muscle use.  Cardiovascular: Tachycardia, no murmurs / rubs / gallops. No extremity edema. 2+ pedal pulses.  Abdomen: Soft with mild diffuse tenderness but no guarding, rebound tenderness or rigidity . Bowel sounds positive.  Musculoskeletal: no clubbing / cyanosis. No joint deformity upper and lower extremities. Good ROM, no contractures. Normal muscle tone.  Skin: no rashes, lesions, ulcers. No induration Neurologic: CN 2-12 grossly intact. Sensation intact, Strength 5/5 in all 4.  Psychiatric: Normal judgment and insight.  Alert and oriented x 3. Normal mood.     Labs on Admission: I have personally reviewed following labs and imaging studies  CBC: Recent Labs  Lab 01/26/21 1531  WBC 6.0  NEUTROABS 4.1  HGB 9.5*  HCT 29.7*  MCV 91.7  PLT [id number]   Basic Metabolic Panel: Recent Labs  Lab 01/26/21 1531  NA 129*  K 4.0  CL 95*  CO2 11*  GLUCOSE 774*  BUN 41*  CREATININE 2.35*  CALCIUM 8.2*  MG 1.9   GFR: Estimated Creatinine Clearance: 25.8 mL/min (A) (by C-G formula based on SCr of 2.35 mg/dL (H)). Liver Function Tests: Recent Labs  Lab 01/26/21 1531  AST 17  ALT 15  ALKPHOS 87  BILITOT 1.6*  PROT 5.8*  ALBUMIN 2.2*   Recent Labs  Lab  01/26/21 1531  LIPASE 25   No results for input(s): AMMONIA in the last 168 hours. Coagulation Profile: No results for input(s): INR, PROTIME in the last 168 hours. Cardiac Enzymes: No results for input(s): CKTOTAL, CKMB, CKMBINDEX, TROPONINI in the last 168 hours. BNP (last 3 results) No results for input(s): PROBNP in the last 8760 hours. HbA1C: No results for input(s): HGBA1C in the last 72 hours. CBG: Recent Labs  Lab 01/26/21 1501 01/26/21 1809 01/26/21 1844  GLUCAP >600* >600* 561*   Lipid Profile: No results for input(s): CHOL, HDL, LDLCALC, TRIG, CHOLHDL, LDLDIRECT in the last 72 hours. Thyroid Function Tests: No results for input(s): TSH, T4TOTAL, FREET4, T3FREE, THYROIDAB in the last 72 hours. Anemia Panel: No results for input(s): VITAMINB12, FOLATE, FERRITIN, TIBC, IRON, RETICCTPCT in the last 72 hours. Urine analysis:    Component Value Date/Time   COLORURINE STRAW (A) 01/26/2021 1707   APPEARANCEUR CLEAR 01/26/2021 1707   LABSPEC 1.017 01/26/2021 1707   PHURINE 5.0 01/26/2021 1707   GLUCOSEU >=500 (A) 01/26/2021 1707   HGBUR MODERATE (A) 01/26/2021 1707   BILIRUBINUR NEGATIVE 01/26/2021 1707   KETONESUR 80 (A) 01/26/2021 1707   PROTEINUR >=300 (A) 01/26/2021 1707   UROBILINOGEN 0.2 11/06/2015 1011   NITRITE  NEGATIVE 01/26/2021 1707   LEUKOCYTESUR NEGATIVE 01/26/2021 1707    Radiological Exams on Admission: DG Chest Portable 1 View  Result Date: 01/26/2021 CLINICAL DATA:  Nausea, weakness, mid chest pain EXAM: PORTABLE CHEST 1 VIEW COMPARISON:  Chest radiograph 05/19/2013 FINDINGS: The cardiomediastinal silhouette is normal. There is slight asymmetric elevation of the right hemidiaphragm, similar to 2014. There is no focal consolidation or pulmonary edema. There is no pleural effusion or pneumothorax. There is no acute osseous abnormality. IMPRESSION: No radiographic evidence of acute cardiopulmonary process. Electronically Signed   By: Valetta Mole M.D.   On: 01/26/2021 16:15      Assessment/Plan  DKA with history of uncontrolled type 1 diabetes - Last hemoglobin A1c about 7 months ago of 11.9 -pH of 7.24, BG 774, bicarb 11, anion gap of 23 -- replete potassium as needed - continue insulin gtt with goal of 140-180 and AG <12 - IV NS until BG <250, then switch to D5 1/2 NS  - BMP q4hr  - keep NPO-patient insisted that she be given chicken broth.  I discussed extensively that her blood is acidic and her sugar is significantly elevated.  Discussed with her that for her safety and well-being she would need to be n.p.o. overnight to allow for her treatment to be more effective.  Epigastric abdominal pain - Patient admitted for similar pain on June 3 and had no acute findings on CT abdomen and pelvis at that time.  Her abdomen today is soft and only mildly tender.  Suspect this could be due to gastroparesis and uncontrolled type 1 diabetes. -pt reportedly had vasovagal episode with morphine.  Will give IV fentanyl for pain.  AKI on CKD stage 3 -Creatinine of 2.34 on admission - Monitor with repeat labs in the morning following IV fluids - Avoid nephrotoxic agent  Anemia - Stable at her baseline with hemoglobin 9.5  Hypertension - continue chlorthaldone   Peripheral neuropathy - Continue  gabapentin  DVT prophylaxis:.Lovenox Code Status: Full Family Communication: Plan discussed with patient at bedside  disposition Plan: Home with at least 2 midnight stays  Consults called:  Admission status: inpatient  Level of care: Progressive  Status is: Inpatient  Remains inpatient appropriate because:Inpatient level of care  appropriate due to severity of illness  Dispo: The patient is from: Home              Anticipated d/c is to: Home              Patient currently is not medically stable to d/c.   Difficult to place patient No         Orene Desanctis DO [facility]   If 7PM-7AM, please contact night-coverage [email or web address]   01/26/2021, 7:31 PM

## 2021-01-26 NOTE — ED Notes (Signed)
NT informed RN that pt is refusing CBG check. RN informed pt that while she is on the insulin drip, we must monitor her glucose levels every 30 minutes, which was previously explained to the patient prior to initiating the insulin drip. Pt requesting RN to "proove" to the pt that the last CBG was taken 30 minutes ago. RN showed the pt the last CBG check on endo tool. Pt then began speaking rudely to staff, stating we are treating her like a child. RN informed pt we are not treating her in any negative way, we are doing what the pt requests. Pt agreed to having CBG checked. RN informed pt that refusing CBG checks causes a delay in changing the endotool which could alter her care and encouraged pt to cooperative moving forward.

## 2021-01-26 NOTE — ED Triage Notes (Signed)
Pt BIBA from home with c/o abdominal pain for last couple months. Multiple ER visits and work up negative. Pt also c/o nausea and weakness. Hx DM1  156/108 HR-112 CBG - over 600 RR-20 20 G left AC '4mg'$  zofran  100 mcg fentanyl

## 2021-01-26 NOTE — ED Notes (Signed)
Paged Tu MD per Charge Request for this patient to be moved to a step down bed due to insulin drip

## 2021-01-26 NOTE — ED Notes (Signed)
During attempt to obtain covid swab, pt would not allow RN to advance swab into her nose. Pt stated the nurse was "too rough" and she wanted someone else to do the swab. RN informed pt that another nurse would perform the swab the same way to collect a adequate sample. Pt again requested another nurse to obtain swab. Another nurse attempted to obtain swab and had the same issues. PA made aware.

## 2021-01-26 NOTE — ED Provider Notes (Signed)
[facility] DEPT Provider Note   CSN: [id number] Arrival date & time: 01/26/21  1416     History Chief Complaint  Patient presents with   Hyperglycemia   Abdominal Pain    Ann Robertson is a 30 y.o. female with history of type 1 diabetes who presents with concern for weakness and nausea that started today as well as abdominal pain for the last few months.  States she has been taking her Lantus but not as prescribed as she does not eat normally.  Patient tearful at time of my exam.  Denies any chest pain, shortness of breath or palpitations but does note lightheadedness with change in position.  I personally reviewed her medical records.  She is not on any anticoagulation. 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   Neuropathy    Preeclampsia    STD (sexually transmitted disease)     Patient Active Problem List   Diagnosis Date Noted   Abdominal pain, acute, epigastric 10/30/2020   Abdominal pain 10/30/2020   Nausea 06/10/2020   Foot pain, right 04/05/2018   Postpartum care following cesarean delivery 11/21/2015   Preterm labor 11/16/2015   Noncompliance with diabetes treatment 11/15/2015   Traumatic injury during pregnancy in third trimester 11/14/2015   Maternal chronic hypertension 11/06/2015   Previous cesarean section complicating pregnancy [id number]   Short interval between pregnancies complicating pregnancy, antepartum 07/14/2015   History of preterm delivery, currently pregnant 07/14/2015   History of pre-eclampsia in prior pregnancy, currently pregnant 07/14/2015   Supervision of high risk pregnancy, antepartum 07/14/2015   History of preterm delivery 09/06/2014   History of polyhydramnios 09/06/2014   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[place]) 12/06/2013     Past Surgical History:  Procedure Laterality Date   CESAREAN SECTION     CESAREAN SECTION N/A 07/05/2013   Procedure: CESAREAN SECTION;  Surgeon: Emily Filbert, MD;  Location: [facility];  Service: Obstetrics;  Laterality: N/A;   CESAREAN SECTION N/A 07/25/2014   Procedure: CESAREAN SECTION;  Surgeon: Donnamae Jude, MD;  Location: [facility];  Service: Obstetrics;  Laterality: N/A;   CESAREAN SECTION N/A 11/21/2015   Procedure: CESAREAN SECTION;  Surgeon: Donnamae Jude, MD;  Location: [facility];  Service: Obstetrics;  Laterality: N/A;   WISDOM TOOTH EXTRACTION       OB History     Gravida  5   Para  4   Term  0   Preterm  4   AB  1   Living  4      SAB  1   IAB  0   Ectopic  0   Multiple  0   Live Births  4           Family History  Problem Relation Age of Onset   Stroke Mother    Hypertension Mother    Heart disease Mother    Kidney disease Mother    Hyperlipidemia Father    Diabetes Father     Social History   Tobacco Use   Smoking status: Every Day    Packs/day: 0.25    Years: 9.00    Pack years: 2.25    Types: Cigarettes   Smokeless tobacco: Never  Substance Use Topics   Alcohol use: Yes  Comment: occasional   Drug use: No    Home Medications Prior to Admission medications   Medication Sig Start Date End Date Taking? Authorizing Provider  acetaminophen (TYLENOL) 500 MG tablet Take 500-1,000 mg by mouth every 6 (six) hours as needed for moderate pain.    [provider]  chlorthalidone (HYGROTON) 25 MG tablet Take 1 tablet (25 mg total) by mouth daily. 10/29/20   Loni Beckwith, PA-C  Continuous Blood Gluc Receiver (FREESTYLE LIBRE 2 READER) DEVI 1 each by Does not apply route in the morning, at noon, in the evening, and at bedtime. 10/31/20   Brimage, Ronnette Juniper, DO  gabapentin (NEURONTIN) 600 MG tablet Take 600 mg by mouth 3 (three) times daily. 10/28/20   [provider]  insulin glargine (LANTUS SOLOSTAR) 100 UNIT/ML  Solostar Pen Inject 12 Units into the skin every morning. And pen needles 1/day 10/31/20   Brimage, Ronnette Juniper, DO  Insulin Pen Needle (PEN NEEDLES) 30G X 8 MM MISC 1 each by Does not apply route daily. E11.9 05/14/19   Renato Shin, MD  ondansetron (ZOFRAN) 4 MG tablet Take 1 tablet (4 mg total) by mouth every 8 (eight) hours as needed for nausea or vomiting. 06/10/20   Renato Shin, MD    Allergies    Hydromorphone hcl  Review of Systems   Review of Systems  Constitutional:  Positive for activity change and fatigue. Negative for appetite change, chills and fever.  HENT: Negative.    Eyes: Negative.   Respiratory: Negative.    Cardiovascular: Negative.   Gastrointestinal:  Positive for abdominal pain and nausea. Negative for blood in stool, constipation, diarrhea and vomiting.  Genitourinary:  Positive for decreased urine volume. Negative for dyspareunia, dysuria, enuresis, flank pain, frequency, genital sores, hematuria, menstrual problem, pelvic pain, urgency, vaginal bleeding, vaginal discharge and vaginal pain.  Musculoskeletal: Negative.   Skin: Negative.   Neurological:  Positive for light-headedness and headaches. Negative for syncope.   Physical Exam Updated Vital Signs BP (!) 172/112   Pulse (!) 103   Temp 98 F (36.7 C) (Oral)   Resp 12   Ht '5\' 5"'$  (1.651 m)   Wt 47.6 kg   SpO2 100%   BMI 17.46 kg/m   Physical Exam Vitals and nursing note reviewed.  Constitutional:      Appearance: She is underweight. She is ill-appearing. She is not toxic-appearing.  HENT:     Head: Normocephalic and atraumatic.     Nose: Nose normal.     Mouth/Throat:     Mouth: Mucous membranes are dry.     Pharynx: Oropharynx is clear. Uvula midline. No oropharyngeal exudate or posterior oropharyngeal erythema.     Tonsils: No tonsillar exudate.  Eyes:     General: Lids are normal. Vision grossly intact.        Right eye: No discharge.        Left eye: No discharge.     Extraocular Movements:  Extraocular movements intact.     Conjunctiva/sclera: Conjunctivae normal.     Pupils: Pupils are equal, round, and reactive to light.  Neck:     Trachea: Trachea and phonation normal.  Cardiovascular:     Rate and Rhythm: Normal rate and regular rhythm.     Pulses: Normal pulses.     Heart sounds: Normal heart sounds. No murmur heard. Pulmonary:     Effort: Pulmonary effort is normal. No tachypnea, bradypnea, accessory muscle usage, prolonged expiration or respiratory distress.     Breath  sounds: Normal breath sounds. No wheezing or rales.  Chest:     Chest wall: No mass, lacerations, deformity, swelling, tenderness, crepitus or edema.  Abdominal:     General: Bowel sounds are normal. There is no distension.     Palpations: Abdomen is soft.     Tenderness: There is generalized abdominal tenderness and tenderness in the right upper quadrant, epigastric area and periumbilical area. There is no right CVA tenderness, left CVA tenderness, guarding or rebound.  Musculoskeletal:        General: No deformity.     Cervical back: Normal range of motion and neck supple. No edema, rigidity or crepitus. No pain with movement, spinous process tenderness or muscular tenderness.     Right lower leg: 1+ Edema present.     Left lower leg: 1+ Edema present.  Lymphadenopathy:     Cervical: No cervical adenopathy.  Skin:    General: Skin is warm and dry.     Findings: No rash.  Neurological:     Mental Status: She is alert. Mental status is at baseline.  Psychiatric:        Mood and Affect: Mood normal.    ED Results / Procedures / Treatments   Labs (all labs ordered are listed, but only abnormal results are displayed) Labs Reviewed  CBG MONITORING, ED - Abnormal; Notable for the following components:      Result Value   Glucose-Capillary >600 (*)    All other components within normal limits  RESP PANEL BY RT-PCR (FLU A&B, COVID) ARPGX2  CBC WITH DIFFERENTIAL/PLATELET  COMPREHENSIVE METABOLIC  PANEL  LIPASE, BLOOD  BETA-HYDROXYBUTYRIC ACID  OSMOLALITY  BLOOD GAS, VENOUS  URINALYSIS, ROUTINE W REFLEX MICROSCOPIC  MAGNESIUM  HCG, QUANTITATIVE, PREGNANCY  I-STAT BETA HCG BLOOD, ED ([facility], [facility], [facility] ONLY)    EKG EKG Interpretation  Date/Time:  Monday January 26 2021 14:52:01 EDT Ventricular Rate:  109 PR Interval:  124 QRS Duration: 82 QT Interval:  301 QTC Calculation: 406 R Axis:   71 Text Interpretation: Sinus tachycardia Nonspecific T abnormalities, lateral leads No significant change since prior 6/22 Confirmed by Aletta Edouard [phone]) on 01/26/2021 3:49:15 PM  Radiology DG Chest Portable 1 View  Result Date: 01/26/2021 CLINICAL DATA:  Nausea, weakness, mid chest pain EXAM: PORTABLE CHEST 1 VIEW COMPARISON:  Chest radiograph 05/19/2013 FINDINGS: The cardiomediastinal silhouette is normal. There is slight asymmetric elevation of the right hemidiaphragm, similar to 2014. There is no focal consolidation or pulmonary edema. There is no pleural effusion or pneumothorax. There is no acute osseous abnormality. IMPRESSION: No radiographic evidence of acute cardiopulmonary process. Electronically Signed   By: Valetta Mole M.D.   On: 01/26/2021 16:15    Procedures .Critical Care  Date/Time: 01/26/2021 6:49 PM Performed by: Emeline Darling, PA-C Authorized by: Emeline Darling, PA-C   Critical care provider statement:    Critical care time (minutes):  45   Critical care was necessary to treat or prevent imminent or life-threatening deterioration of the following conditions:  Metabolic crisis   Critical care was time spent personally by me on the following activities:  Discussions with consultants, evaluation of patient's response to treatment, examination of patient, ordering and performing treatments and interventions, ordering and review of laboratory studies, ordering and review of radiographic studies, pulse oximetry, re-evaluation of patient's condition, obtaining  history from patient or surrogate and review of old charts   Medications Ordered in ED Medications  fentaNYL (SUBLIMAZE) injection 50 mcg (50 mcg Intravenous Given  01/26/21 1616)    ED Course  I have reviewed the triage vital signs and the nursing notes.  Pertinent labs & imaging results that were available during my care of the patient were reviewed by me and considered in my medical decision making (see chart for details).  Clinical Course as of 01/26/21 Cira Rue Jan 26, 2021  1745 Patient administered 2 mg of morphine IV with subsequent basal like episode.  No respiratory arrest or depression, no decrease in blood pressure or elevation in heart rate.  Patient regained consciousness after approximately 60 seconds and was immediately coherent.  Stating that she is having some shortness of breath which did resolve again after another 60 seconds.  Prior to administration of morphine patient did confirm that she is tolerated morphine in the past though she does not tolerate hydromorphone well.  It does appear that she is sensitive to morphine as well, we will proceed with fentanyl for analgesia throughout the remainder of her ED stay. [RS]  8560 30 year old female type I diabetic with history of DKA here with abdominal pain elevated blood sugars.  Labs consistent with DKA.  Will need to go on insulin drip and admission to the hospital. [MB]  1845 Consult to hospitalist who is agreeable admitting this patient to his service.  Appreciate his collaboration in the care of this patient. [RS]    Clinical Course User Index [MB] Hayden Rasmussen, MD [RS] Jalien Weakland, Sharlene Dory   MDM Rules/Calculators/A&P                         30 year old female with weakness and nausea as well as abdominal pain.  CBG on intake greater than 600.  Differential diagnosis includes limited to HHS, DKA, metabolic derangement, dehydration, COVID-19 and influenza A/B, sepsis.  Hypertensive, mildly tachycardic to  104.  Cardiopulmonary exam is normal, abdominal exam with exquisite epigastric tenderness palpation and generalized abdominal tenderness.  Very mild bilateral lower extremity edema without pitting.  Oropharyngeal exam reveals dry mucous membranes, no posterior pharyngeal erythema or tonsillar edema or exudate.  Concern for hyperglycemic crisis, will proceed with appropriate laboratory studies.  Antiemetic and analgesia given in the ambulance.  Patient requesting more analgesia, provided.  CBC with anemia at patient's baseline with hemoglobin of 9.5 , CMP with hyponatremia of 129, however corrected sodium is 145 glycemia.  Potassium is 4.0 glucose on CMP is 774.  Patient with creatinine of 2.35 which is elevated from her baseline which is closer to 1.7.  VBG with pH of 7.24.  We will proceed with treatment for DKA.  Magnesium is normal, 1.9.  Beta hydroxybutyric acid greater than 8.  Quantitative hCG is negative, 2.  Lipase is normal, 25.  Chest x-ray negative for acute cardiopulmonary disease.  EKG with sinus tachycardia, no changes from prior.  Patient requesting more pain medication.  Was given's dose of 2 mg of morphine and had vasovagal like episode after administration of morphine.  Patient did not suffer a cardiac arrest, though she did become increasingly tachycardic.  Patient came to after approximately 60 seconds and vital signs remain normal.  Patient holding left side of her chest after this episode.  Stating that she feels short of breath, however this resolved after a few seconds.  No rhythm changes noted on the monitor.  Patient will require admission to the hospital for hyperglycemic crisis with DKA.  Consult to hospitalist pending.  Called to hospitalist as above, patient admitted  to his service.  Appreciate his collaboration care of this patient.  Zakariya and her father voiced understanding of her medical evaluation and treatment plan thus far.  Each of their questions was answered to  their expressed satisfaction.  She is amenable to plan for admission at this time.  This chart was dictated using voice recognition software, Dragon. Despite the best efforts of this provider to proofread and correct errors, errors may still occur which can change documentation meaning.   Final Clinical Impression(s) / ED Diagnoses Final diagnoses:  None    Rx / DC Orders ED Discharge Orders     None        Aura Dials 01/26/21 1849    Hayden Rasmussen, MD 01/27/21 1137

## 2021-01-26 NOTE — ED Notes (Addendum)
Charge RN at bedside. 

## 2021-01-26 NOTE — ED Notes (Signed)
See previous notes regarding delay in 1926 blood draw.

## 2021-01-27 ENCOUNTER — Encounter (HOSPITAL_COMMUNITY): Payer: Self-pay | Admitting: Family Medicine

## 2021-01-27 LAB — HEMOGLOBIN A1C
Hgb A1c MFr Bld: 12 % — ABNORMAL HIGH (ref 4.8–5.6)
Mean Plasma Glucose: 297.7 mg/dL

## 2021-01-27 LAB — BASIC METABOLIC PANEL
Anion gap: 4 — ABNORMAL LOW (ref 5–15)
Anion gap: 7 (ref 5–15)
Anion gap: 9 (ref 5–15)
BUN: 30 mg/dL — ABNORMAL HIGH (ref 6–20)
BUN: 33 mg/dL — ABNORMAL HIGH (ref 6–20)
BUN: 34 mg/dL — ABNORMAL HIGH (ref 6–20)
CO2: 23 mmol/L (ref 22–32)
CO2: 25 mmol/L (ref 22–32)
CO2: 27 mmol/L (ref 22–32)
Calcium: 7.9 mg/dL — ABNORMAL LOW (ref 8.9–10.3)
Calcium: 8 mg/dL — ABNORMAL LOW (ref 8.9–10.3)
Calcium: 8.2 mg/dL — ABNORMAL LOW (ref 8.9–10.3)
Chloride: 105 mmol/L (ref 98–111)
Chloride: 106 mmol/L (ref 98–111)
Chloride: 106 mmol/L (ref 98–111)
Creatinine, Ser: 1.73 mg/dL — ABNORMAL HIGH (ref 0.44–1.00)
Creatinine, Ser: 1.92 mg/dL — ABNORMAL HIGH (ref 0.44–1.00)
Creatinine, Ser: 2 mg/dL — ABNORMAL HIGH (ref 0.44–1.00)
GFR, Estimated: 33 mL/min — ABNORMAL LOW (ref >60)
GFR, Estimated: 35 mL/min — ABNORMAL LOW (ref >60)
GFR, Estimated: 40 mL/min — ABNORMAL LOW (ref >60)
Glucose, Bld: 139 mg/dL — ABNORMAL HIGH (ref 70–99)
Glucose, Bld: 163 mg/dL — ABNORMAL HIGH (ref 70–99)
Glucose, Bld: 277 mg/dL — ABNORMAL HIGH (ref 70–99)
Potassium: 3.7 mmol/L (ref 3.5–5.1)
Potassium: 4 mmol/L (ref 3.5–5.1)
Potassium: 4.3 mmol/L (ref 3.5–5.1)
Sodium: 137 mmol/L (ref 135–145)
Sodium: 137 mmol/L (ref 135–145)
Sodium: 138 mmol/L (ref 135–145)

## 2021-01-27 LAB — GLUCOSE, CAPILLARY
Glucose-Capillary: 100 mg/dL — ABNORMAL HIGH (ref 70–99)
Glucose-Capillary: 101 mg/dL — ABNORMAL HIGH (ref 70–99)
Glucose-Capillary: 122 mg/dL — ABNORMAL HIGH (ref 70–99)
Glucose-Capillary: 123 mg/dL — ABNORMAL HIGH (ref 70–99)
Glucose-Capillary: 127 mg/dL — ABNORMAL HIGH (ref 70–99)
Glucose-Capillary: 138 mg/dL — ABNORMAL HIGH (ref 70–99)
Glucose-Capillary: 141 mg/dL — ABNORMAL HIGH (ref 70–99)
Glucose-Capillary: 144 mg/dL — ABNORMAL HIGH (ref 70–99)
Glucose-Capillary: 146 mg/dL — ABNORMAL HIGH (ref 70–99)
Glucose-Capillary: 165 mg/dL — ABNORMAL HIGH (ref 70–99)
Glucose-Capillary: 202 mg/dL — ABNORMAL HIGH (ref 70–99)
Glucose-Capillary: 73 mg/dL (ref 70–99)
Glucose-Capillary: 79 mg/dL (ref 70–99)
Glucose-Capillary: 85 mg/dL (ref 70–99)
Glucose-Capillary: 86 mg/dL (ref 70–99)

## 2021-01-27 LAB — OSMOLALITY: Osmolality: 329 mOsm/kg (ref 275–295)

## 2021-01-27 LAB — BETA-HYDROXYBUTYRIC ACID: Beta-Hydroxybutyric Acid: 0.47 mmol/L — ABNORMAL HIGH (ref 0.05–0.27)

## 2021-01-27 LAB — MRSA NEXT GEN BY PCR, NASAL: MRSA by PCR Next Gen: NOT DETECTED

## 2021-01-27 MED ORDER — LACTATED RINGERS IV SOLN: INTRAVENOUS | Status: DC

## 2021-01-27 MED ORDER — ENOXAPARIN SODIUM 40 MG/0.4ML IJ SOSY
40.0000 mg | PREFILLED_SYRINGE | INTRAMUSCULAR | Status: DC
  Administered 2021-01-27 – 2021-02-01 (×6): 40 mg via SUBCUTANEOUS
  Filled 2021-01-27 (×6): qty 0.4

## 2021-01-27 MED ORDER — METOPROLOL TARTRATE 5 MG/5ML IV SOLN
5.0000 mg | Freq: Four times a day (QID) | INTRAVENOUS | Status: DC | PRN
  Administered 2021-01-27: 5 mg via INTRAVENOUS
  Filled 2021-01-27: qty 5

## 2021-01-27 MED ORDER — DEXTROSE 5 % IV SOLN: INTRAVENOUS | Status: DC

## 2021-01-27 MED ORDER — INSULIN ASPART 100 UNIT/ML IJ SOLN: 5.0000 [IU] | Freq: Three times a day (TID) | INTRAMUSCULAR | Status: DC

## 2021-01-27 MED ORDER — INSULIN DETEMIR 100 UNIT/ML ~~LOC~~ SOLN
5.0000 [IU] | Freq: Every day | SUBCUTANEOUS | Status: DC
  Administered 2021-01-27 – 2021-01-28 (×2): 5 [IU] via SUBCUTANEOUS
  Filled 2021-01-27 (×3): qty 0.05

## 2021-01-27 MED ORDER — HYDRALAZINE HCL 50 MG PO TABS
50.0000 mg | ORAL_TABLET | Freq: Three times a day (TID) | ORAL | Status: DC | PRN
  Administered 2021-01-27 – 2021-01-28 (×2): 50 mg via ORAL
  Filled 2021-01-27 (×3): qty 1

## 2021-01-27 MED ORDER — METOPROLOL SUCCINATE ER 50 MG PO TB24
50.0000 mg | ORAL_TABLET | Freq: Every day | ORAL | Status: DC
  Administered 2021-01-27 – 2021-01-29 (×3): 50 mg via ORAL
  Filled 2021-01-27: qty 2
  Filled 2021-01-27: qty 1
  Filled 2021-01-27: qty 2

## 2021-01-27 MED ORDER — INSULIN ASPART 100 UNIT/ML IJ SOLN: 0.0000 [IU] | Freq: Every day | INTRAMUSCULAR | Status: DC

## 2021-01-27 MED ORDER — OXYCODONE HCL 5 MG PO TABS
5.0000 mg | ORAL_TABLET | ORAL | Status: DC
  Administered 2021-01-27 – 2021-02-01 (×9): 5 mg via ORAL
  Filled 2021-01-27 (×12): qty 1

## 2021-01-27 MED ORDER — OXYCODONE-ACETAMINOPHEN 5-325 MG PO TABS
1.0000 | ORAL_TABLET | ORAL | Status: DC
Start: 2021-01-27 — End: 2021-02-02
  Administered 2021-01-27 – 2021-02-02 (×14): 1 via ORAL
  Filled 2021-01-27 (×17): qty 1

## 2021-01-27 MED ORDER — INSULIN ASPART 100 UNIT/ML IJ SOLN: 3.0000 [IU] | Freq: Three times a day (TID) | INTRAMUSCULAR | Status: DC

## 2021-01-27 MED ORDER — SODIUM CHLORIDE 0.9 % IV BOLUS
1000.0000 mL | Freq: Once | INTRAVENOUS | Status: AC
  Administered 2021-01-27: 1000 mL via INTRAVENOUS

## 2021-01-27 MED ORDER — OXYCODONE-ACETAMINOPHEN 10-325 MG PO TABS: 1.0000 | ORAL_TABLET | ORAL | Status: DC

## 2021-01-27 MED ORDER — HYDROXYZINE HCL 10 MG PO TABS
10.0000 mg | ORAL_TABLET | Freq: Four times a day (QID) | ORAL | Status: AC | PRN
Start: 2021-01-27 — End: 2021-01-29
  Administered 2021-01-27 – 2021-01-29 (×2): 10 mg via ORAL
  Filled 2021-01-27 (×4): qty 1

## 2021-01-27 MED ORDER — INSULIN ASPART 100 UNIT/ML IJ SOLN
0.0000 [IU] | Freq: Three times a day (TID) | INTRAMUSCULAR | Status: DC
Start: 2021-01-27 — End: 2021-02-02
  Administered 2021-01-27: 1 [IU] via SUBCUTANEOUS
  Administered 2021-01-28: 5 [IU] via SUBCUTANEOUS
  Administered 2021-01-28: 9 [IU] via SUBCUTANEOUS
  Administered 2021-01-29: 1 [IU] via SUBCUTANEOUS
  Administered 2021-01-30 – 2021-01-31 (×2): 2 [IU] via SUBCUTANEOUS
  Administered 2021-01-31: 3 [IU] via SUBCUTANEOUS
  Administered 2021-02-01: 1 [IU] via SUBCUTANEOUS
  Administered 2021-02-01: 2 [IU] via SUBCUTANEOUS
  Administered 2021-02-02: 5 [IU] via SUBCUTANEOUS
  Administered 2021-02-02: 3 [IU] via SUBCUTANEOUS

## 2021-01-27 MED ORDER — GABAPENTIN 300 MG PO CAPS
600.0000 mg | ORAL_CAPSULE | Freq: Three times a day (TID) | ORAL | Status: DC
  Administered 2021-01-27 – 2021-02-02 (×19): 600 mg via ORAL
  Filled 2021-01-27 (×19): qty 2

## 2021-01-27 MED ORDER — CHLORTHALIDONE 25 MG PO TABS
25.0000 mg | ORAL_TABLET | Freq: Every day | ORAL | Status: DC
  Filled 2021-01-27: qty 1

## 2021-01-27 MED ORDER — ACETAMINOPHEN 500 MG PO TABS: 500.0000 mg | ORAL_TABLET | Freq: Four times a day (QID) | ORAL | Status: DC | PRN

## 2021-01-27 NOTE — Progress Notes (Addendum)
Inpatient Diabetes Program Recommendations  AACE/ADA: New Consensus Statement on Inpatient Glycemic Control (2015)  Target Ranges:  Prepandial:   less than 140 mg/dL      Peak postprandial:   less than 180 mg/dL (1-2 hours)      Critically ill patients:  140 - 180 mg/dL   Lab Results  Component Value Date   GLUCAP 138 (H) 01/27/2021   HGBA1C 12.0 (H) 01/26/2021    Review of Glycemic Control  Diabetes history: DM1 Outpatient Diabetes medications: Lantus 12 units QAM, Humalog Current orders for Inpatient glycemic control: IV insulin per [employer] transitioning to Levemir 5 units QD, Novolog 0-9 units TID with meals and 0-5 HS  HgbA1C - 12.0% Will need meal coverage insulin since Type 1 and does not make any insulin  Inpatient Diabetes Program Recommendations:    Add Novolog 3 units TID with meals if eating > 50% meal  Will likely need more Levemir - 5 units BID  Will speak with pt this morning about HgbA1C of 12% and diabetes control at home.  Thank you. Lorenda Peck, RD, LDN, CDE Inpatient Diabetes Coordinator [phone]   Addendum: Spoke with pt at bedside about her diabetes and HgbA1C of 12%. Pt states she was tired and did not want to talk about diabetes. She did tell me that she doesn't always take her insulin and said she knew her HgbA1C was high. Turned her head away and closed her eyes.  Will attempt to speak again with her at another time.

## 2021-01-27 NOTE — Progress Notes (Signed)
   01/27/21 1240 01/27/21 1245 01/27/21 1250  Vitals  BP 113/69 (!) 81/55 112/83    01/27/21 1420  Vitals  BP 109/71   Dr. Maren Beach notified of above b/p with instances of dizziness while on commode;  also notified of BG to '73mg'$ /dl - '86mg'$ /dl (after x2 juice and 1/2 hamburger) prior to MD notification with Poor PO intake - see new orders.

## 2021-01-27 NOTE — Progress Notes (Addendum)
PROGRESS NOTE    Ann Robertson  [id number] DOB: 02-Feb-1991 DOA: 01/26/2021 PCP: [facility]   Chief Complaint  Patient presents with   Hyperglycemia   Abdominal Pain  Brief Narrative: Per HPI: 30 y.o. female with medical history significant for  T1DM W/ peripheral neuropathy, nephrotic syndrome, CKD stage IIIa, HTN, and anemia who presents with abdominal pain.  Patient reports that she awoke 8/29 AM feeling short of breath with severe epigastric abdominal pain. Pain has been chronic for years,has been told by her PCP that it was due to her anemia.Pain comes and goes and is sharp, crampy and dull. Pain worse after she urinates but no dysuria. Feels nauseous but no vomiting. Has only been able to eat 3 grapes,No diarrhea. No fever. Reports she is on a regimen of 7 Units of Lantus daily with sliding scale. Takes 7 units "whenever she feels like it" during the day. Glucometer often reads > 500 and sometimes in the 200-400s.  Hx of 4 C-sections in the past. Reports tobacco use of 3-4 cigarettes per day, no alcohol or illicit drug use.   ED Course: Afebrile, mildly tachycardic and hypertensive up to systolic of [id number].  No leukocytosis.  Hemoglobin of 9.5 around her baseline.  Sodium 129.  Blood glucose of 774, pH 7.24, bicarb of 11, gap of 23, Creatinine elevated 2.35 from baseline of about 1.4 Patient was admitted for DKA on insulin drip.  Subjective: Seen and examined this morning.  Denies nausea vomiting has abdomen pain some in the epigastrium. Wants to eat Afebrile overnight blood pressure high up to 204/115 Mildly tachycardic in 100s,Sugar down to 120s and gap closed last night Creat at 1.9.  Assessment & Plan:  DKA  T1DM with uncontrolled hyperglycemia Managed with IV fluids IV insulin.  Gap is closed last night. Sugar better in d5. Will start diet, transition to lantus 5 units and ssi achs and stop iv insulin 2 hrs after sq insulin.  Discussed about compliance has not  been taking Lantus at home. Recent Labs  Lab 01/27/21 0254 01/27/21 0357 01/27/21 0500 01/27/21 0629 01/27/21 0740  GLUCAP 146* 122* 141* 123* 127*     Abdominal pain, acute on chronic,epigastric: Continue pain control IV / oral opiates- resume p.o. home meds.  On admission lipase and LFTs were normal with total bili elevated 1.6.  Last CT abdomen for similar complaint on June 3 was no acute finding.  Question gastroparesis/DKA causing pain.  If recurrent consider CT abdomen  Orthostatic hypotension: BP went from 113/69 to 81/55 on the commode , was dizzy- advise bed rest, bolus with 1 liter NSS. Pt refusing bedpan, advised bed rest for now.  AKI on CKD IIIa: Baseline creatinine 1.4.  Improving continue IV fluid hydration.  age IIIa:  Recent Labs  Lab 01/26/21 1531 01/26/21 2210 01/26/21 2337 01/27/21 0302 01/27/21 0742  BUN 41* 35* 34* 33* 30*  CREATININE 2.35* 2.00* 2.00* 1.92* 1.73*    Anemia of chronic disease-stable. Recent Labs  Lab 01/26/21 1531  HGB 9.5*  HCT 29.7*    Hypertension urgency: Uncontrolled.   Refusing hydralazine.  Resume her metoprolol she is not on HCTZ anymore.  Peripheral neuropathy: Stable.  Continue her gabapentin.  Diet Order             Diet NPO time specified Except for: Sips with Meds, Ice Chips  Diet effective now  Patient's Body mass index is 18.49 kg/m.  DVT prophylaxis: enoxaparin (LOVENOX) injection 40 mg Start: 01/27/21 2200 Code Status:   Code Status: Full Code  Family Communication: plan of care discussed with patient at bedside. Status is: Inpatient  Remains inpatient appropriate because:IV treatments appropriate due to intensity of illness or inability to take PO and Inpatient level of care appropriate due to severity of illness  Dispo: The patient is from: Home              Anticipated d/c is to: Home              Patient currently is not medically stable to d/c.   Difficult to place  patient No       Unresulted Labs (From admission, onward)     Start     Ordered   01/26/21 [id number]  Basic metabolic panel  (Diabetes Ketoacidosis (DKA))  STAT Now then every 4 hours ,   STAT      01/26/21 1926   01/26/21 1926  Beta-hydroxybutyric acid  (Diabetes Ketoacidosis (DKA))  Now then every 8 hours,   R (with TIMED occurrences)      01/26/21 1926           Medications reviewed:  Scheduled Meds:  Chlorhexidine Gluconate Cloth  6 each Topical Daily   chlorthalidone  25 mg Oral Daily   enoxaparin (LOVENOX) injection  40 mg Subcutaneous Q24H   gabapentin  600 mg Oral TID   Continuous Infusions:  dextrose 5% lactated ringers 125 mL/hr at 01/27/21 0404   insulin 0.5 Units/hr (01/27/21 0404)   lactated ringers 125 mL/hr at 01/27/21 0000   Consultants:see note  Procedures:see note Antimicrobials: Anti-infectives (From admission, onward)    None      Culture/Microbiology No results found for: SDES, SPECREQUEST, CULT, REPTSTATUS  Other culture-see note  Objective: Vitals: Today's Vitals   01/27/21 0500 01/27/21 0508 01/27/21 0600 01/27/21 0630  BP:  (!) 169/108  (!) 161/103  Pulse:    (!) 107  Resp:  15  18  Temp: 97.9 F (36.6 C)     TempSrc: Oral     SpO2:    100%  Weight:      Height:      PainSc:  9  0-No pain     Intake/Output Summary (Last 24 hours) at 01/27/2021 0739 Last data filed at 01/27/2021 0404 Gross per 24 hour  Intake 2230.95 ml  Output --  Net 2230.95 ml   Filed Weights   01/26/21 1454 01/27/21 0012  Weight: 47.6 kg 50.4 kg   Weight change:   Intake/Output from previous day: 08/29 0701 - 08/30 0700 In: 2231 [I.V.:1131; IV Piggyback:1100] Out: -  Intake/Output this shift: No intake/output data recorded. Filed Weights   01/26/21 1454 01/27/21 0012  Weight: 47.6 kg 50.4 kg   Examination: General exam: AAO x3, thin frail HEENT:Oral mucosa moist, Ear/Nose WNL grossly,dentition normal. Respiratory system: bilaterally clear breath  sounds, no use of accessory muscle, non tender. Cardiovascular system: S1 & S2 +,No JVD. Gastrointestinal system: Abdomen soft, scaphoid, mild epigastric tenderness present no rebound tenderness, BS+. Nervous System:Alert, awake, moving extremities Extremities: no edema, distal peripheral pulses palpable.  Skin: No rashes,no icterus. MSK: Normal muscle bulk,tone, power Data Reviewed: I have personally reviewed following labs and imaging studies CBC: Recent Labs  Lab 01/26/21 1531  WBC 6.0  NEUTROABS 4.1  HGB 9.5*  HCT 29.7*  MCV 91.7  PLT [id number]   Basic Metabolic Panel: Recent  Labs  Lab 01/26/21 1531 01/26/21 2210 01/26/21 2337 01/27/21 0302  NA 129* 137 137 138  K 4.0 4.0 4.3 4.0  CL 95* 104 105 106  CO2 11* '22 23 25  '$ GLUCOSE 774* 379* 277* 163*  BUN 41* 35* 34* 33*  CREATININE 2.35* 2.00* 2.00* 1.92*  CALCIUM 8.2* 8.1* 8.2* 8.0*  MG 1.9  --   --   --    GFR: Estimated Creatinine Clearance: 33.5 mL/min (A) (by C-G formula based on SCr of 1.92 mg/dL (H)). Liver Function Tests: Recent Labs  Lab 01/26/21 1531  AST 17  ALT 15  ALKPHOS 87  BILITOT 1.6*  PROT 5.8*  ALBUMIN 2.2*   Recent Labs  Lab 01/26/21 1531  LIPASE 25   No results for input(s): AMMONIA in the last 168 hours. Coagulation Profile: No results for input(s): INR, PROTIME in the last 168 hours. Cardiac Enzymes: No results for input(s): CKTOTAL, CKMB, CKMBINDEX, TROPONINI in the last 168 hours. BNP (last 3 results) No results for input(s): PROBNP in the last 8760 hours. HbA1C: Recent Labs    01/26/21 2210  HGBA1C 12.0*   CBG: Recent Labs  Lab 01/27/21 0156 01/27/21 0254 01/27/21 0357 01/27/21 0500 01/27/21 0629  GLUCAP 165* 146* 122* 141* 123*   Lipid Profile: No results for input(s): CHOL, HDL, LDLCALC, TRIG, CHOLHDL, LDLDIRECT in the last 72 hours. Thyroid Function Tests: No results for input(s): TSH, T4TOTAL, FREET4, T3FREE, THYROIDAB in the last 72 hours. Anemia Panel: No  results for input(s): VITAMINB12, FOLATE, FERRITIN, TIBC, IRON, RETICCTPCT in the last 72 hours. Sepsis Labs: No results for input(s): PROCALCITON, LATICACIDVEN in the last 168 hours.  Recent Results (from the past 240 hour(s))  Resp Panel by RT-PCR (Flu A&B, Covid) Nasopharyngeal Swab     Status: None   Collection Time: 01/26/21  5:03 PM   Specimen: Nasopharyngeal Swab; Nasopharyngeal(NP) swabs in vial transport medium  Result Value Ref Range Status   SARS Coronavirus 2 by RT PCR NEGATIVE NEGATIVE Final    Comment: (NOTE) SARS-CoV-2 target nucleic acids are NOT DETECTED.  The SARS-CoV-2 RNA is generally detectable in upper respiratory specimens during the acute phase of infection. The lowest concentration of SARS-CoV-2 viral copies this assay can detect is 138 copies/mL. A negative result does not preclude SARS-Cov-2 infection and should not be used as the sole basis for treatment or other patient management decisions. A negative result may occur with  improper specimen collection/handling, submission of specimen other than nasopharyngeal swab, presence of viral mutation(s) within the areas targeted by this assay, and inadequate number of viral copies(<138 copies/mL). A negative result must be combined with clinical observations, patient history, and epidemiological information. The expected result is Negative.  Fact Sheet for Patients:  EntrepreneurPulse.com.au  Fact Sheet for Healthcare Providers:  IncredibleEmployment.be  This test is no t yet approved or cleared by the [place] FDA and  has been authorized for detection and/or diagnosis of SARS-CoV-2 by FDA under an Emergency Use Authorization (EUA). This EUA will remain  in effect (meaning this test can be used) for the duration of the COVID-19 declaration under Section 564(b)(1) of the Act, 21 U.S.C.section 360bbb-3(b)(1), unless the authorization is terminated  or revoked sooner.        Influenza A by PCR NEGATIVE NEGATIVE Final   Influenza B by PCR NEGATIVE NEGATIVE Final    Comment: (NOTE) The Xpert Xpress SARS-CoV-2/FLU/RSV plus assay is intended as an aid in the diagnosis of influenza from Nasopharyngeal swab specimens  and should not be used as a sole basis for treatment. Nasal washings and aspirates are unacceptable for Xpert Xpress SARS-CoV-2/FLU/RSV testing.  Fact Sheet for Patients: EntrepreneurPulse.com.au  Fact Sheet for Healthcare Providers: IncredibleEmployment.be  This test is not yet approved or cleared by the [place] FDA and has been authorized for detection and/or diagnosis of SARS-CoV-2 by FDA under an Emergency Use Authorization (EUA). This EUA will remain in effect (meaning this test can be used) for the duration of the COVID-19 declaration under Section 564(b)(1) of the Act, 21 U.S.C. section 360bbb-3(b)(1), unless the authorization is terminated or revoked.  Performed at [facility], [place] [address], [place], [place] [address]   MRSA Next Gen by PCR, Nasal     Status: None   Collection Time: 01/26/21 11:01 PM   Specimen: Nasal Mucosa; Nasal Swab  Result Value Ref Range Status   MRSA by PCR Next Gen NOT DETECTED NOT DETECTED Final    Comment: (NOTE) The GeneXpert MRSA Assay (FDA approved for NASAL specimens only), is one component of a comprehensive MRSA colonization surveillance program. It is not intended to diagnose MRSA infection nor to guide or monitor treatment for MRSA infections. Test performance is not FDA approved in patients less than 66 years old. Performed at [facility], [place] [address] [place], [place] [address]      Radiology Studies: DG Chest Portable 1 View  Result Date: 01/26/2021 CLINICAL DATA:  Nausea, weakness, mid chest pain EXAM: PORTABLE CHEST 1 VIEW COMPARISON:  Chest radiograph 05/19/2013 FINDINGS: The cardiomediastinal  silhouette is normal. There is slight asymmetric elevation of the right hemidiaphragm, similar to 2014. There is no focal consolidation or pulmonary edema. There is no pleural effusion or pneumothorax. There is no acute osseous abnormality. IMPRESSION: No radiographic evidence of acute cardiopulmonary process. Electronically Signed   By: Valetta Mole M.D.   On: 01/26/2021 16:15     LOS: 1 day   Antonieta Pert, MD [facility]  01/27/2021, 7:39 AM

## 2021-01-28 DIAGNOSIS — I951 Orthostatic hypotension: Secondary | ICD-10-CM

## 2021-01-28 DIAGNOSIS — I160 Hypertensive urgency: Secondary | ICD-10-CM

## 2021-01-28 LAB — GLUCOSE, CAPILLARY
Glucose-Capillary: 152 mg/dL — ABNORMAL HIGH (ref 70–99)
Glucose-Capillary: 271 mg/dL — ABNORMAL HIGH (ref 70–99)
Glucose-Capillary: 429 mg/dL — ABNORMAL HIGH (ref 70–99)
Glucose-Capillary: 442 mg/dL — ABNORMAL HIGH (ref 70–99)
Glucose-Capillary: 496 mg/dL — ABNORMAL HIGH (ref 70–99)
Glucose-Capillary: 56 mg/dL — ABNORMAL LOW (ref 70–99)
Glucose-Capillary: 59 mg/dL — ABNORMAL LOW (ref 70–99)

## 2021-01-28 LAB — BASIC METABOLIC PANEL
Anion gap: 7 (ref 5–15)
BUN: 27 mg/dL — ABNORMAL HIGH (ref 6–20)
CO2: 22 mmol/L (ref 22–32)
Calcium: 7.5 mg/dL — ABNORMAL LOW (ref 8.9–10.3)
Chloride: 102 mmol/L (ref 98–111)
Creatinine, Ser: 2.11 mg/dL — ABNORMAL HIGH (ref 0.44–1.00)
GFR, Estimated: 31 mL/min — ABNORMAL LOW (ref >60)
Glucose, Bld: 278 mg/dL — ABNORMAL HIGH (ref 70–99)
Potassium: 3.7 mmol/L (ref 3.5–5.1)
Sodium: 131 mmol/L — ABNORMAL LOW (ref 135–145)

## 2021-01-28 LAB — GLUCOSE, RANDOM: Glucose, Bld: 443 mg/dL — ABNORMAL HIGH (ref 70–99)

## 2021-01-28 MED ORDER — ACETAMINOPHEN 325 MG PO TABS
650.0000 mg | ORAL_TABLET | ORAL | Status: DC | PRN
  Administered 2021-01-29 – 2021-02-01 (×9): 650 mg via ORAL
  Filled 2021-01-28 (×9): qty 2

## 2021-01-28 MED ORDER — INSULIN ASPART 100 UNIT/ML IJ SOLN
10.0000 [IU] | Freq: Once | INTRAMUSCULAR | Status: AC
  Administered 2021-01-28: 10 [IU] via SUBCUTANEOUS

## 2021-01-28 NOTE — Assessment & Plan Note (Signed)
-   stable Hgb

## 2021-01-28 NOTE — Progress Notes (Deleted)
[place] NOTE  Patient Care Team: [facility] as PCP - General  CHIEF COMPLAINTS/PURPOSE OF CONSULTATION:  ***  ASSESSMENT & PLAN:  No problem-specific Assessment & Plan notes found for this encounter.  No orders of the defined types were placed in this encounter.    HISTORY OF PRESENTING ILLNESS:  Ann Robertson 30 y.o. female is here because of anemia of chronic kidney disease.  REVIEW OF SYSTEMS:   Constitutional: Denies fevers, chills or abnormal night sweats Eyes: Denies blurriness of vision, double vision or watery eyes Ears, nose, mouth, throat, and face: Denies mucositis or sore throat Respiratory: Denies cough, dyspnea or wheezes Cardiovascular: Denies palpitation, chest discomfort or lower extremity swelling Gastrointestinal:  Denies nausea, heartburn or change in bowel habits Skin: Denies abnormal skin rashes Lymphatics: Denies new lymphadenopathy or easy bruising Neurological:Denies numbness, tingling or new weaknesses Behavioral/Psych: Mood is stable, no new changes  All other systems were reviewed with the patient and are negative.  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   Neuropathy    Preeclampsia    STD (sexually transmitted disease)     SURGICAL HISTORY: Past Surgical History:  Procedure Laterality Date   CESAREAN SECTION     CESAREAN SECTION N/A 07/05/2013   Procedure: CESAREAN SECTION;  Surgeon: Emily Filbert, MD;  Location: [facility];  Service: Obstetrics;  Laterality: N/A;   CESAREAN SECTION N/A 07/25/2014   Procedure: CESAREAN SECTION;  Surgeon: Donnamae Jude, MD;  Location: [facility];  Service: Obstetrics;  Laterality: N/A;   CESAREAN SECTION N/A 11/21/2015   Procedure: CESAREAN SECTION;  Surgeon: Donnamae Jude, MD;  Location: [facility];  Service: Obstetrics;  Laterality: N/A;   WISDOM TOOTH EXTRACTION      SOCIAL  HISTORY: Social History   Socioeconomic History   Marital status: Single    Spouse name: Not on file   Number of children: Not on file   Years of education: Not on file   Highest education level: Not on file  Occupational History   Not on file  Tobacco Use   Smoking status: Every Day    Packs/day: 0.25    Years: 9.00    Pack years: 2.25    Types: Cigarettes   Smokeless tobacco: Never  Vaping Use   Vaping Use: Not on file  Substance and Sexual Activity   Alcohol use: Yes    Comment: occasional   Drug use: No   Sexual activity: Not Currently    Birth control/protection: None  Other Topics Concern   Not on file  Social History Narrative   Not on file   Social Determinants of Health   Financial Resource Strain: Not on file  Food Insecurity: Not on file  Transportation Needs: Not on file  Physical Activity: Not on file  Stress: Not on file  Social Connections: Not on file  Intimate Partner Violence: Not on file    FAMILY HISTORY: Family History  Problem Relation Age of Onset   Stroke Mother    Hypertension Mother    Heart disease Mother    Kidney disease Mother    Hyperlipidemia Father    Diabetes Father     ALLERGIES:  is allergic to hydromorphone hcl.  MEDICATIONS:  No current facility-administered medications for this visit.   No current outpatient medications on file.   Facility-Administered Medications Ordered in Other Visits  Medication Dose Route Frequency Provider Last Rate Last Admin   acetaminophen (TYLENOL) tablet 650 mg  650 mg Oral Q4H PRN Dwyane Dee, MD       Chlorhexidine Gluconate Cloth 2 % PADS 6 each  6 each Topical Daily Tu, Ching T, DO   6 each at 01/27/21 2041   dextrose 50 % solution 0-50 mL  0-50 mL Intravenous PRN Sponseller, Rebekah R, PA-C       enoxaparin (LOVENOX) injection 40 mg  40 mg Subcutaneous Q24H Tu, Ching T, DO   40 mg at 01/27/21 2041   gabapentin (NEURONTIN) capsule 600 mg  600 mg Oral TID Tu, Ching T, DO   600 mg  at 01/28/21 0915   hydrALAZINE (APRESOLINE) tablet 50 mg  50 mg Oral Q8H PRN Antonieta Pert, MD   50 mg at 01/28/21 0405   hydrOXYzine (ATARAX/VISTARIL) tablet 10 mg  10 mg Oral Q6H PRN Kathryne Eriksson, NP   10 mg at 01/27/21 0550   insulin aspart (novoLOG) injection 0-5 Units  0-5 Units Subcutaneous QHS Kc, Ramesh, MD       insulin aspart (novoLOG) injection 0-9 Units  0-9 Units Subcutaneous TID WC Kc, Ramesh, MD   9 Units at 01/28/21 1131   insulin detemir (LEVEMIR) injection 5 Units  5 Units Subcutaneous Daily Kc, Ramesh, MD   5 Units at 01/28/21 1140   metoprolol succinate (TOPROL-XL) 24 hr tablet 50 mg  50 mg Oral Daily Kc, Ramesh, MD   50 mg at 01/28/21 0916   metoprolol tartrate (LOPRESSOR) injection 5 mg  5 mg Intravenous Q6H PRN Kc, Maren Beach, MD   5 mg at 01/27/21 2041   ondansetron (ZOFRAN) injection 4 mg  4 mg Intravenous Q6H PRN Tu, Ching T, DO   4 mg at 01/27/21 0550   oxyCODONE-acetaminophen (PERCOCET/ROXICET) 5-325 MG per tablet 1 tablet  1 tablet Oral Q4H Kc, Maren Beach, MD   1 tablet at 01/28/21 1244   And   oxyCODONE (Oxy IR/ROXICODONE) immediate release tablet 5 mg  5 mg Oral Q4H Kc, Ramesh, MD   5 mg at 01/28/21 1244     PHYSICAL EXAMINATION: ECOG PERFORMANCE STATUS: {CHL ONC ECOG PS:[phone]}  There were no vitals filed for this visit. There were no vitals filed for this visit.  GENERAL:alert, no distress and comfortable SKIN: skin color, texture, turgor are normal, no rashes or significant lesions EYES: normal, conjunctiva are pink and non-injected, sclera clear OROPHARYNX:no exudate, no erythema and lips, buccal mucosa, and tongue normal  NECK: supple, thyroid normal size, non-tender, without nodularity LYMPH:  no palpable lymphadenopathy in the cervical, axillary or inguinal LUNGS: clear to auscultation and percussion with normal breathing effort HEART: regular rate & rhythm and no murmurs and no lower extremity edema ABDOMEN:abdomen soft, non-tender and normal bowel  sounds Musculoskeletal:no cyanosis of digits and no clubbing  PSYCH: alert & oriented x 3 with fluent speech NEURO: no focal motor/sensory deficits  LABORATORY DATA:  I have reviewed the data as listed Lab Results  Component Value Date   WBC 6.0 01/26/2021   HGB 9.5 (L) 01/26/2021   HCT 29.7 (L) 01/26/2021   MCV 91.7 01/26/2021   PLT 331 01/26/2021     Chemistry      Component Value Date/Time   NA 131 (L) 01/28/2021 0310   K 3.7 01/28/2021 0310   CL 102 01/28/2021 0310   CO2 22 01/28/2021 0310   BUN 27 (H) 01/28/2021 0310   CREATININE 2.11 (H) 01/28/2021 0310  CREATININE 0.64 11/06/2015 1125      Component Value Date/Time   CALCIUM 7.5 (L) 01/28/2021 0310   ALKPHOS 87 01/26/2021 1531   AST 17 01/26/2021 1531   ALT 15 01/26/2021 1531   BILITOT 1.6 (H) 01/26/2021 1531       RADIOGRAPHIC STUDIES: I have personally reviewed the radiological images as listed and agreed with the findings in the report. DG Chest Portable 1 View  Result Date: 01/26/2021 CLINICAL DATA:  Nausea, weakness, mid chest pain EXAM: PORTABLE CHEST 1 VIEW COMPARISON:  Chest radiograph 05/19/2013 FINDINGS: The cardiomediastinal silhouette is normal. There is slight asymmetric elevation of the right hemidiaphragm, similar to 2014. There is no focal consolidation or pulmonary edema. There is no pleural effusion or pneumothorax. There is no acute osseous abnormality. IMPRESSION: No radiographic evidence of acute cardiopulmonary process. Electronically Signed   By: Valetta Mole M.D.   On: 01/26/2021 16:15    All questions were answered. The patient knows to call the clinic with any problems, questions or concerns. I spent *** minutes in the care of this patient including H and P, review of records, counseling and coordination of care.     Benay Pike, MD 01/28/2021 1:14 PM

## 2021-01-28 NOTE — TOC Initial Note (Signed)
Transition of Care [facility]) - Initial/Assessment Note    Patient Details  Name: Ann Robertson MRN: [id number] Date of Birth: 1990-12-03  Transition of Care [facility]) CM/SW Contact:    Leeroy Cha, RN Phone Number: 01/28/2021, 8:39 AM  Clinical Narrative:                 30 y.o. female with medical history significant for  T1DM W/ peripheral neuropathy, nephrotic syndrome, CKD stage IIIa, HTN, and anemia who presents with abdominal pain.  Patient reports that she awoke 8/29 AM feeling short of breath with severe epigastric abdominal pain. Pain has been chronic for years,has been told by her PCP that it was due to her anemia.Pain comes and goes and is sharp, crampy and dull. Pain worse after she urinates but no dysuria. Feels nauseous but no vomiting. Has only been able to eat 3 grapes,No diarrhea. No fever. Reports she is on a regimen of 7 Units of Lantus daily with sliding scale. Takes 7 units "whenever she feels like it" during the day. Glucometer often reads > 500 and sometimes in the 200-400s.  Hx of 4 C-sections in the past. Reports tobacco use of 3-4 cigarettes per day, no alcohol or illicit drug use.   ED Course: Afebrile, mildly tachycardic and hypertensive up to systolic of [id number].  No leukocytosis.  Hemoglobin of 9.5 around her baseline.  Sodium 129.  Blood glucose of 774, pH 7.24, bicarb of 11, gap of 23, Creatinine elevated 2.35 from baseline of about 1.4 Patient was admitted for DKA on insulin drip. TOC PLAN OF CARE: following for toc needs of hhc and dme, following for progression-iv lr at 100cc/hr, has been seen by the diabetic coordinator. Expected Discharge Plan: Home/Self Care Barriers to Discharge: Continued Medical Work up   Patient Goals and CMS Choice        Expected Discharge Plan and Services Expected Discharge Plan: Home/Self Care   Discharge Planning Services: CM Consult   Living arrangements for the past 2 months: Single Family Home                                       Prior Living Arrangements/Services Living arrangements for the past 2 months: Single Family Home Lives with:: Self Patient language and need for interpreter reviewed:: Yes Do you feel safe going back to the place where you live?: Yes            Criminal Activity/Legal Involvement Pertinent to Current Situation/Hospitalization: No - Comment as needed  Activities of Daily Living Home Assistive Devices/Equipment: Other (Comment) (free style libre glucose monitoring system) ADL Screening (condition at time of admission) Patient's cognitive ability adequate to safely complete daily activities?: Yes Is the patient deaf or have difficulty hearing?: No Does the patient have difficulty seeing, even when wearing glasses/contacts?: No Does the patient have difficulty concentrating, remembering, or making decisions?: No Patient able to express need for assistance with ADLs?: Yes Does the patient have difficulty dressing or bathing?: No Independently performs ADLs?: Yes (appropriate for developmental age) Does the patient have difficulty walking or climbing stairs?: No Weakness of Legs: Both (peripheral neuropathy) Weakness of Arms/Hands: None  Permission Sought/Granted                  Emotional Assessment Appearance:: Appears stated age     Orientation: : Oriented to Self, Oriented to Place, Oriented to  Time, Oriented  to Situation Alcohol / Substance Use: Not Applicable Psych Involvement: No (comment)  Admission diagnosis:  DKA (diabetic ketoacidosis) ([place]) [E11.10] Patient Active Problem List   Diagnosis Date Noted   DKA (diabetic ketoacidosis) ([place]) 01/26/2021   Acute-on-chronic kidney injury ([place]) 01/26/2021   Anemia 01/26/2021   HTN (hypertension) 01/26/2021   Peripheral neuropathy 01/26/2021   Abdominal pain, acute, epigastric 10/30/2020   Abdominal pain 10/30/2020   Nausea 06/10/2020   Foot pain, right 04/05/2018   Postpartum care following  cesarean delivery 11/21/2015   Preterm labor 11/16/2015   Noncompliance with diabetes treatment 11/15/2015   Traumatic injury during pregnancy in third trimester 11/14/2015   Maternal chronic hypertension 11/06/2015   Previous cesarean section complicating pregnancy [id number]   Short interval between pregnancies complicating pregnancy, antepartum 07/14/2015   History of preterm delivery, currently pregnant 07/14/2015   History of pre-eclampsia in prior pregnancy, currently pregnant 07/14/2015   Supervision of high risk pregnancy, antepartum 07/14/2015   History of preterm delivery 09/06/2014   History of polyhydramnios 09/06/2014   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

## 2021-01-28 NOTE — Progress Notes (Signed)
Progress Note    Ann Robertson   [id number]  DOB: Mar 25, 1991  DOA: 01/26/2021     2  PCP: Ann Robertson  Initial CC: abd pain  Hospital Course: [name] is a 29 y.o. female with medical history significant for  T1DM W/ peripheral neuropathy, nephrotic syndrome, CKD stage IIIa, HTN, and anemia who presented with abdominal pain.  Patient reports that she awoke 8/29 AM feeling short of breath with severe epigastric abdominal pain. Pain has been chronic for years,has been told by her PCP that it was due to her anemia.Pain comes and goes and is sharp, crampy and dull. Pain worse after she urinates but no dysuria. Feels nauseous but no vomiting. No diarrhea. No fever. Reports she is on a regimen of 7 Units of Lantus daily with sliding scale. Takes 7 units "whenever she feels like it" during the day. Glucometer often reads > 500 and sometimes in the 200-400s.  Hx of 4 C-sections in the past. Reports tobacco use of 3-4 cigarettes per day, no alcohol or illicit drug use. Underwent work-up in the ER and was found to be in DKA and started on protocol. DKA resolved with treatment and she was transitioned to subcutaneous insulins.    Interval History:  Seen in her room this morning.  Was talking to her significant other on the phone.  After our conversation regarding stronger compliance with insulin regimen and monitoring of her glucose levels she became tearful and upset. Also provides further history of recently losing a loved one.  Offered her a chaplain for support but she declined.  Recommended she also discussed with her PCP about a referral outpatient if still needed.  ROS: Constitutional: negative for chills and fevers, Respiratory: negative for cough, Cardiovascular: negative for chest pain, and Gastrointestinal: positive for abdominal pain, negative for constipation  Assessment & Plan: * DKA (diabetic ketoacidosis) (HCC)-resolved as of 01/28/2021 - Likely due to  noncompliance with insulins at home.  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  Has been higher than this in the past.  Patient does not seem to have good understanding of the importance of maintaining consistent eating habits as well as insulin compliance - Continue basal insulin for now at current dose.  As appetite increases, will adjust regimen - Hold prandial insulin for now - Continue SSI and CBG monitoring - Stop all fluids  Peripheral neuropathy - Continue gabapentin  Acute renal failure superimposed on stage 3a chronic kidney disease ([place]) - patient has history of CKD3a. Baseline creat ~ 1.4 - 1.7, eGFR 47 - 51 - patient presents with increase in creat >0.3 mg/dL above baseline, creat increase >1.5x baseline presumed to have occurred within past 7 days PTA - improved with IVF - at risk for progressive CKD due to uncontrolled DM  HTN (hypertension) - continue current regimen   Anemia - stable Hgb  Abdominal pain, acute, epigastric - Chronic pain per patient.  Reports that she is on chronic opiates as well (percocet thru pain clinic) - Suspect there may be some underlying gastroparesis contributing given severely uncontrolled diabetes at baseline  Hypertensive urgency-resolved as of 01/28/2021 - Patient initially refused hydralazine - now controlled on current regimen  Orthostatic hypotension-resolved as of 01/28/2021 - Resolved with fluids    Old records reviewed in assessment of this patient  Antimicrobials:   DVT prophylaxis: enoxaparin (LOVENOX) injection 40  mg Start: 01/27/21 2200   Code Status:   Code Status: Full Code Family Communication: significant other  Disposition Plan: Status is: Inpatient  Remains inpatient appropriate because:Inpatient level of care appropriate due to severity of illness  Dispo: The patient  is from: Home              Anticipated d/c is to: Home              Patient currently is not medically stable to d/c.   Difficult to place patient No      Risk of unplanned readmission score: Unplanned Admission- Pilot do not use: 19.53   Objective: Blood pressure (!) 155/104, pulse (!) 111, temperature (!) 97.4 F (36.3 C), temperature source Oral, resp. rate (!) 21, height 5' 5"  (1.651 m), weight 50.4 kg, SpO2 100 %.  Examination: General appearance: alert, cooperative, and no distress Head: Normocephalic, without obvious abnormality, atraumatic Eyes:  EOMI Lungs: clear to auscultation bilaterally Heart: regular rate and rhythm and S1, S2 normal Abdomen: normal findings: bowel sounds normal and spleen non-palpable Extremities:  no edema Skin: mobility and turgor normal Neurologic: Grossly normal  Consultants:    Procedures:    Data Reviewed: I have personally reviewed following labs and imaging studies Results for orders placed or performed during the hospital encounter of 01/26/21 (from the past 24 hour(s))  Glucose, capillary     Status: None   Collection Time: 01/27/21  1:09 PM  Result Value Ref Range   Glucose-Capillary 79 70 - 99 mg/dL  Glucose, capillary     Status: None   Collection Time: 01/27/21  1:27 PM  Result Value Ref Range   Glucose-Capillary 73 70 - 99 mg/dL  Glucose, capillary     Status: None   Collection Time: 01/27/21  2:05 PM  Result Value Ref Range   Glucose-Capillary 85 70 - 99 mg/dL  Glucose, capillary     Status: None   Collection Time: 01/27/21  2:23 PM  Result Value Ref Range   Glucose-Capillary 86 70 - 99 mg/dL  Glucose, capillary     Status: Abnormal   Collection Time: 01/27/21  3:46 PM  Result Value Ref Range   Glucose-Capillary 101 (H) 70 - 99 mg/dL  Glucose, capillary     Status: Abnormal   Collection Time: 01/27/21  9:33 PM  Result Value Ref Range   Glucose-Capillary 100 (H) 70 - 99 mg/dL  Basic metabolic panel     Status:  Abnormal   Collection Time: 01/28/21  3:10 AM  Result Value Ref Range   Sodium 131 (L) 135 - 145 mmol/L   Potassium 3.7 3.5 - 5.1 mmol/L   Chloride 102 98 - 111 mmol/L   CO2 22 22 - 32 mmol/L   Glucose, Bld 278 (H) 70 - 99 mg/dL   BUN 27 (H) 6 - 20 mg/dL   Creatinine, Ser 2.11 (H) 0.44 - 1.00 mg/dL   Calcium 7.5 (L) 8.9 - 10.3 mg/dL   GFR, Estimated 31 (L) >60 mL/min   Anion gap 7 5 - 15  Glucose, capillary     Status: Abnormal   Collection Time: 01/28/21  7:33 AM  Result Value Ref Range   Glucose-Capillary 429 (H) 70 - 99 mg/dL   Comment 1 Notify RN    Comment 2 Document in Chart   Glucose, random     Status: Abnormal   Collection Time: 01/28/21  8:09 AM  Result Value Ref Range   Glucose, Bld 443 (H)  70 - 99 mg/dL  Glucose, capillary     Status: Abnormal   Collection Time: 01/28/21 10:59 AM  Result Value Ref Range   Glucose-Capillary 496 (H) 70 - 99 mg/dL   Comment 1 Notify RN    Comment 2 Document in Chart     Recent Results (from the past 240 hour(s))  Resp Panel by RT-PCR (Flu A&B, Covid) Nasopharyngeal Swab     Status: None   Collection Time: 01/26/21  5:03 PM   Specimen: Nasopharyngeal Swab; Nasopharyngeal(NP) swabs in vial transport medium  Result Value Ref Range Status   SARS Coronavirus 2 by RT PCR NEGATIVE NEGATIVE Final    Comment: (NOTE) SARS-CoV-2 target nucleic acids are NOT DETECTED.  The SARS-CoV-2 RNA is generally detectable in upper respiratory specimens during the acute phase of infection. The lowest concentration of SARS-CoV-2 viral copies this assay can detect is 138 copies/mL. A negative result does not preclude SARS-Cov-2 infection and should not be used as the sole basis for treatment or other patient management decisions. A negative result may occur with  improper specimen collection/handling, submission of specimen other than nasopharyngeal swab, presence of viral mutation(s) within the areas targeted by this assay, and inadequate number of  viral copies(<138 copies/mL). A negative result must be combined with clinical observations, patient history, and epidemiological information. The expected result is Negative.  Fact Sheet for Patients:  EntrepreneurPulse.com.au  Fact Sheet for Healthcare Providers:  IncredibleEmployment.be  This test is no t yet approved or cleared by the [place] FDA and  has been authorized for detection and/or diagnosis of SARS-CoV-2 by FDA under an Emergency Use Authorization (EUA). This EUA will remain  in effect (meaning this test can be used) for the duration of the COVID-19 declaration under Section 564(b)(1) of the Act, 21 U.S.C.section 360bbb-3(b)(1), unless the authorization is terminated  or revoked sooner.       Influenza A by PCR NEGATIVE NEGATIVE Final   Influenza B by PCR NEGATIVE NEGATIVE Final    Comment: (NOTE) The Xpert Xpress SARS-CoV-2/FLU/RSV plus assay is intended as an aid in the diagnosis of influenza from Nasopharyngeal swab specimens and should not be used as a sole basis for treatment. Nasal washings and aspirates are unacceptable for Xpert Xpress SARS-CoV-2/FLU/RSV testing.  Fact Sheet for Patients: EntrepreneurPulse.com.au  Fact Sheet for Healthcare Providers: IncredibleEmployment.be  This test is not yet approved or cleared by the [place] FDA and has been authorized for detection and/or diagnosis of SARS-CoV-2 by FDA under an Emergency Use Authorization (EUA). This EUA will remain in effect (meaning this test can be used) for the duration of the COVID-19 declaration under Section 564(b)(1) of the Act, 21 U.S.C. section 360bbb-3(b)(1), unless the authorization is terminated or revoked.  Performed at [facility], [place] [address], [place], [place] [address]   MRSA Next Gen by PCR, Nasal     Status: None   Collection Time: 01/26/21 11:01 PM   Specimen: Nasal  Mucosa; Nasal Swab  Result Value Ref Range Status   MRSA by PCR Next Gen NOT DETECTED NOT DETECTED Final    Comment: (NOTE) The GeneXpert MRSA Assay (FDA approved for NASAL specimens only), is one component of a comprehensive MRSA colonization surveillance program. It is not intended to diagnose MRSA infection nor to guide or monitor treatment for MRSA infections. Test performance is not FDA approved in patients less than 40 years old. Performed at [facility], [place] [address] [place], [place] [address]  Radiology Studies: DG Chest Portable 1 View  Result Date: 01/26/2021 CLINICAL DATA:  Nausea, weakness, mid chest pain EXAM: PORTABLE CHEST 1 VIEW COMPARISON:  Chest radiograph 05/19/2013 FINDINGS: The cardiomediastinal silhouette is normal. There is slight asymmetric elevation of the right hemidiaphragm, similar to 2014. There is no focal consolidation or pulmonary edema. There is no pleural effusion or pneumothorax. There is no acute osseous abnormality. IMPRESSION: No radiographic evidence of acute cardiopulmonary process. Electronically Signed   By: Valetta Mole M.D.   On: 01/26/2021 16:15   DG Chest Portable 1 View  Final Result      Scheduled Meds:  Chlorhexidine Gluconate Cloth  6 each Topical Daily   enoxaparin (LOVENOX) injection  40 mg Subcutaneous Q24H   gabapentin  600 mg Oral TID   insulin aspart  0-5 Units Subcutaneous QHS   insulin aspart  0-9 Units Subcutaneous TID WC   insulin detemir  5 Units Subcutaneous Daily   metoprolol succinate  50 mg Oral Daily   oxyCODONE-acetaminophen  1 tablet Oral Q4H   And   oxyCODONE  5 mg Oral Q4H   PRN Meds: acetaminophen, dextrose, hydrALAZINE, hydrOXYzine, metoprolol tartrate, ondansetron (ZOFRAN) IV Continuous Infusions:   LOS: 2 days  Time spent: Greater than 50% of the 35 minute visit was spent in counseling/coordination of care for the patient as laid out in the A&P.   Dwyane Dee, MD [facility] 01/28/2021, 12:45 PM

## 2021-01-28 NOTE — Assessment & Plan Note (Signed)
-  patient has history of CKD3a. Baseline creat ~ 1.4 - 1.7, eGFR 47 - 51 - patient presents with increase in creat >0.3 mg/dL above baseline, creat increase >1.5x baseline presumed to have occurred within past 7 days PTA - improved with IVF - at risk for progressive CKD due to uncontrolled DM

## 2021-01-28 NOTE — Progress Notes (Signed)
Report given to [facility] RN Larence Penning;  Pt aware of transfer orders and in agreement.  [facility] aware of transfer.  Pt to be transported by unit tech to room 1318 via w/c

## 2021-01-28 NOTE — Hospital Course (Addendum)
Ann Robertson is a 30 y.o. female with medical history significant for  T1DM W/ peripheral neuropathy, nephrotic syndrome, CKD stage IIIa, HTN, and anemia who presented with abdominal pain.  Patient reports that she awoke 8/29 AM feeling short of breath with severe epigastric abdominal pain. Pain has been chronic for years,has been told by her PCP that it was due to her anemia.Pain comes and goes and is sharp, crampy and dull. Pain worse after she urinates but no dysuria. Feels nauseous but no vomiting. No diarrhea. No fever. Reports she is on a regimen of 7 Units of Lantus daily with sliding scale. Takes 7 units "whenever she feels like it" during the day. Glucometer often reads > 500 and sometimes in the 200-400s.  Hx of 4 C-sections in the past. Reports tobacco use of 3-4 cigarettes per day, no alcohol or illicit drug use. Underwent work-up in the ER and was found to be in DKA and started on protocol. DKA resolved with treatment and she was transitioned to subcutaneous insulins.  See below for further A&P.

## 2021-01-28 NOTE — Assessment & Plan Note (Signed)
-   Patient initially refused hydralazine - now controlled on current regimen

## 2021-01-28 NOTE — Progress Notes (Signed)
Inpatient Diabetes Program Recommendations  AACE/ADA: New Consensus Statement on Inpatient Glycemic Control (2015)  Target Ranges:  Prepandial:   less than 140 mg/dL      Peak postprandial:   less than 180 mg/dL (1-2 hours)      Critically ill patients:  140 - 180 mg/dL   Lab Results  Component Value Date   GLUCAP 429 (H) 01/28/2021   HGBA1C 12.0 (H) 01/26/2021    Review of Glycemic Control  Diabetes history: DM1 Current orders for Inpatient glycemic control: Levemir 5 units QD, Novolog 0-9 units TID with meals and 0-5 HS + 3 units TID  Inpatient Diabetes Program Recommendations:    Increase Levemir to 7 units QD  Spoke with pt yesterday about her uncontrolled DM. Pt doesn't seem to understand the severity of uncontrolled DM. Back in April 2022, HgbA1C was > 15%.  Questions whether pt is able to take care of herself?  Continue to follow.  Thank you. Lorenda Peck, RD, LDN, CDE Inpatient Diabetes Coordinator [phone]

## 2021-01-28 NOTE — Assessment & Plan Note (Signed)
Resolved with fluids °

## 2021-01-28 NOTE — Assessment & Plan Note (Addendum)
-   s/p metoprolol (see below) - starting amlodipine due to uncontrolled pressures  - changed metoprolol to coreg on 9/2 - would benefit from lisinopril but awaiting renal function to further normalize; has had wosening proteinuria since April 2022

## 2021-01-28 NOTE — Assessment & Plan Note (Signed)
-   Likely due to noncompliance with insulins at home.  Patient has been counseled on importance of remaining compliant going forward - DKA resolved with treatment

## 2021-01-28 NOTE — Assessment & Plan Note (Signed)
-   Chronic pain per patient.  Reports that she is on chronic opiates as well (percocet thru pain clinic) - Suspect there may be some underlying gastroparesis contributing given severely uncontrolled diabetes at baseline

## 2021-01-28 NOTE — Assessment & Plan Note (Addendum)
-   A1c 12%.  Has been higher than this in the past.  Patient does not seem to have good understanding of the importance of maintaining consistent eating habits as well as insulin compliance - Continue basal insulin, adjusting as needed - patient requested regular diet ("eats what she wants at home"); have changed her diet to regular to better be able to dose her insulin as she is expected to not follow a diabetic diet at home unfortunately - resume prandial insulin and continue SSI - CBGs have elevated as expected as she has had her diet advanced and inevitably not following recommendations -Glucose levels somewhat labile depending on her food intake.  She is discharged with Lantus 5 units daily and instructed to increase by 2 units daily slowly if glucose levels begin to consistently uptrend (instructions provided on AVS)

## 2021-01-28 NOTE — Progress Notes (Signed)
Noted patient with several episodes where she  was noted to start crying. Also noted pt requested saline flushes because it made her feel good. Shared with patient that it could be considered a medication and I would not be giving her periodic flushes. Pt started crying and asked why did I not like her. Later patient noted she wants to be sen for depression as she has recently suffered a los.

## 2021-01-28 NOTE — Assessment & Plan Note (Signed)
Continue gabapentin.

## 2021-01-29 ENCOUNTER — Inpatient Hospital Stay: Payer: Medicaid Other

## 2021-01-29 ENCOUNTER — Telehealth: Payer: Self-pay | Admitting: Hematology and Oncology

## 2021-01-29 ENCOUNTER — Inpatient Hospital Stay: Payer: Medicaid Other | Admitting: Hematology and Oncology

## 2021-01-29 LAB — GLUCOSE, CAPILLARY
Glucose-Capillary: 135 mg/dL — ABNORMAL HIGH (ref 70–99)
Glucose-Capillary: 115 mg/dL — ABNORMAL HIGH (ref 70–99)
Glucose-Capillary: 79 mg/dL (ref 70–99)
Glucose-Capillary: 139 mg/dL — ABNORMAL HIGH (ref 70–99)

## 2021-01-29 MED ORDER — AMLODIPINE BESYLATE 5 MG PO TABS
5.0000 mg | ORAL_TABLET | Freq: Every day | ORAL | Status: DC
  Administered 2021-01-29: 5 mg via ORAL
  Filled 2021-01-29: qty 1

## 2021-01-29 MED ORDER — AMLODIPINE BESYLATE 10 MG PO TABS
10.0000 mg | ORAL_TABLET | Freq: Every day | ORAL | Status: DC
  Administered 2021-01-30 – 2021-02-02 (×4): 10 mg via ORAL
  Filled 2021-01-29 (×4): qty 1

## 2021-01-29 MED ORDER — INSULIN DETEMIR 100 UNIT/ML ~~LOC~~ SOLN
5.0000 [IU] | Freq: Every day | SUBCUTANEOUS | Status: DC
  Administered 2021-01-29 – 2021-01-30 (×2): 5 [IU] via SUBCUTANEOUS
  Filled 2021-01-29 (×2): qty 0.05

## 2021-01-29 MED ORDER — AMLODIPINE BESYLATE 5 MG PO TABS: 5.0000 mg | ORAL_TABLET | Freq: Once | ORAL | Status: AC

## 2021-01-29 NOTE — Progress Notes (Signed)
Progress Note    Ann Robertson   QQI:[id number]  DOB: 1990/10/19  DOA: 01/26/2021     3  PCP: [facility]  Initial CC: abd pain  Hospital Course: [name] is a 30 y.o. female with medical history significant for  T1DM W/ peripheral neuropathy, nephrotic syndrome, CKD stage IIIa, HTN, and anemia who presented with abdominal pain.  Patient reports that she awoke 8/29 AM feeling short of breath with severe epigastric abdominal pain. Pain has been chronic for years,has been told by her PCP that it was due to her anemia.Pain comes and goes and is sharp, crampy and dull. Pain worse after she urinates but no dysuria. Feels nauseous but no vomiting. No diarrhea. No fever. Reports she is on a regimen of 7 Units of Lantus daily with sliding scale. Takes 7 units "whenever she feels like it" during the day. Glucometer often reads > 500 and sometimes in the 200-400s.  Hx of 4 C-sections in the past. Reports tobacco use of 3-4 cigarettes per day, no alcohol or illicit drug use. Underwent work-up in the ER and was found to be in DKA and started on protocol. DKA resolved with treatment and she was transitioned to subcutaneous insulins.  Interval History:  No events overnight.  Tried discussing discharging home today but she was adamantly against this.  Insisted that she was "not feeling well" and did not have enough energy to go home.  Also stated that her body was "swelling". I examined her and told her there are no clinical signs of edema anywhere in her body that I could appreciate and she was in disagreement with me.  Regardless, since blood pressure was above goal this morning, her regimen has been adjusted and we will monitor changes overnight and target for discharge tomorrow.  ROS: Constitutional: negative for chills and fevers, Respiratory: negative for cough, Cardiovascular: negative for chest pain, and Gastrointestinal: positive for abdominal pain, negative for  constipation  Assessment & Plan: * DKA (diabetic ketoacidosis) (HCC)-resolved as of 01/28/2021 - Likely due to noncompliance with insulins at home.  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) - A1c 12%.  Has been higher than this in the past.  Patient does not seem to have good understanding of the importance of maintaining consistent eating habits as well as insulin compliance - Continue basal insulin for now at current dose.  As appetite increases, will adjust regimen - Hold prandial insulin for now - Continue SSI and CBG monitoring - Stop all fluids - liberated diet some to encourage better eating but still encouraged her to make good decisions on what she chooses  Peripheral neuropathy - Continue gabapentin  Acute renal failure superimposed on stage 3a chronic kidney disease ([place]) - patient has history of CKD3a. Baseline creat ~ 1.4 - 1.7, eGFR 47 - 51 - patient presents with increase in creat >0.3 mg/dL above baseline, creat increase >1.5x baseline presumed to have occurred within past 7 days PTA - improved with IVF - at risk for progressive CKD due to uncontrolled DM  HTN (hypertension) - continue metoprolol - starting amlodipine due to uncontrolled pressures   Anemia - stable Hgb  Abdominal pain, acute, epigastric - Chronic pain per patient.  Reports that she is on chronic opiates as well (percocet thru pain clinic) - Suspect there may be some underlying gastroparesis contributing given severely uncontrolled diabetes at baseline  Hypertensive  urgency-resolved as of 01/28/2021 - Patient initially refused hydralazine - now controlled on current regimen  Orthostatic hypotension-resolved as of 01/28/2021 - Resolved with fluids   Old records reviewed in assessment of this patient  Antimicrobials:   DVT prophylaxis: enoxaparin (LOVENOX) injection 40  mg Start: 01/27/21 2200   Code Status:   Code Status: Full Code Family Communication: significant other  Disposition Plan: Status is: Inpatient  Remains inpatient appropriate because:Inpatient level of care appropriate due to severity of illness  Dispo: The patient is from: Home              Anticipated d/c is to: Home              Patient currently is not medically stable to d/c.   Difficult to place patient No  Risk of unplanned readmission score: Unplanned Admission- Pilot do not use: 19.11   Objective: Blood pressure (!) 150/106, pulse 97, temperature 98 F (36.7 C), temperature source Oral, resp. rate 15, height _0  (1.651 m), weight 50.4 kg, SpO2 100 %.  Examination: General appearance: alert, cooperative, and no distress Head: Normocephalic, without obvious abnormality, atraumatic Eyes:  EOMI Lungs: clear to auscultation bilaterally Heart: regular rate and rhythm and S1, S2 normal Abdomen: normal findings: bowel sounds normal and spleen non-palpable Extremities:  No edema in any extremities Skin: mobility and turgor normal Neurologic: Grossly normal  Consultants:    Procedures:    Data Reviewed: I have personally reviewed following labs and imaging studies Results for orders placed or performed during the hospital encounter of 01/26/21 (from the past 24 hour(s))  Glucose, capillary     Status: Abnormal   Collection Time: 01/28/21  3:36 PM  Result Value Ref Range   Glucose-Capillary 271 (H) 70 - 99 mg/dL   Comment 1 Notify RN    Comment 2 Document in Chart   Glucose, capillary     Status: Abnormal   Collection Time: 01/28/21  9:34 PM  Result Value Ref Range   Glucose-Capillary 59 (L) 70 - 99 mg/dL  Glucose, capillary     Status: Abnormal   Collection Time: 01/28/21 10:10 PM  Result Value Ref Range   Glucose-Capillary 56 (L) 70 - 99 mg/dL  Glucose, capillary     Status: Abnormal   Collection Time: 01/28/21 11:33 PM  Result Value Ref Range    Glucose-Capillary 152 (H) 70 - 99 mg/dL  Glucose, capillary     Status: Abnormal   Collection Time: 01/29/21  7:40 AM  Result Value Ref Range   Glucose-Capillary 135 (H) 70 - 99 mg/dL  Glucose, capillary     Status: Abnormal   Collection Time: 01/29/21 11:38 AM  Result Value Ref Range   Glucose-Capillary 115 (H) 70 - 99 mg/dL    Recent Results (from the past 240 hour(s))  Resp Panel by RT-PCR (Flu A&B, Covid) Nasopharyngeal Swab     Status: None   Collection Time: 01/26/21  5:03 PM   Specimen: Nasopharyngeal Swab; Nasopharyngeal(NP) swabs in vial transport medium  Result Value Ref Range Status   SARS Coronavirus 2 by RT PCR NEGATIVE NEGATIVE Final    Comment: (NOTE) SARS-CoV-2 target nucleic acids are NOT DETECTED.  The SARS-CoV-2 RNA is generally detectable in upper respiratory specimens during the acute phase of infection. The lowest concentration of SARS-CoV-2 viral copies this assay can detect is 138 copies/mL. A negative result does not preclude SARS-Cov-2 infection and should not be used as the sole basis for treatment or other patient  management decisions. A negative result may occur with  improper specimen collection/handling, submission of specimen other than nasopharyngeal swab, presence of viral mutation(s) within the areas targeted by this assay, and inadequate number of viral copies(<138 copies/mL). A negative result must be combined with clinical observations, patient history, and epidemiological information. The expected result is Negative.  Fact Sheet for Patients:  EntrepreneurPulse.com.au  Fact Sheet for Healthcare Providers:  IncredibleEmployment.be  This test is no t yet approved or cleared by the [place] FDA and  has been authorized for detection and/or diagnosis of SARS-CoV-2 by FDA under an Emergency Use Authorization (EUA). This EUA will remain  in effect (meaning this test can be used) for the duration of  the COVID-19 declaration under Section 564(b)(1) of the Act, 21 U.S.C.section 360bbb-3(b)(1), unless the authorization is terminated  or revoked sooner.       Influenza A by PCR NEGATIVE NEGATIVE Final   Influenza B by PCR NEGATIVE NEGATIVE Final    Comment: (NOTE) The Xpert Xpress SARS-CoV-2/FLU/RSV plus assay is intended as an aid in the diagnosis of influenza from Nasopharyngeal swab specimens and should not be used as a sole basis for treatment. Nasal washings and aspirates are unacceptable for Xpert Xpress SARS-CoV-2/FLU/RSV testing.  Fact Sheet for Patients: EntrepreneurPulse.com.au  Fact Sheet for Healthcare Providers: IncredibleEmployment.be  This test is not yet approved or cleared by the [place] FDA and has been authorized for detection and/or diagnosis of SARS-CoV-2 by FDA under an Emergency Use Authorization (EUA). This EUA will remain in effect (meaning this test can be used) for the duration of the COVID-19 declaration under Section 564(b)(1) of the Act, 21 U.S.C. section 360bbb-3(b)(1), unless the authorization is terminated or revoked.  Performed at [facility], [place] [address] [place], [place] [address]   MRSA Next Gen by PCR, Nasal     Status: None   Collection Time: 01/26/21 11:01 PM   Specimen: Nasal Mucosa; Nasal Swab  Result Value Ref Range Status   MRSA by PCR Next Gen NOT DETECTED NOT DETECTED Final    Comment: (NOTE) The GeneXpert MRSA Assay (FDA approved for NASAL specimens only), is one component of a comprehensive MRSA colonization surveillance program. It is not intended to diagnose MRSA infection nor to guide or monitor treatment for MRSA infections. Test performance is not FDA approved in patients less than 81 years old. Performed at [facility], [place] [address], [place], [place] [address]      Radiology Studies: No results found. DG Chest Portable 1 View   Final Result      Scheduled Meds:  [START ON 01/30/2021] amLODipine  10 mg Oral Daily   amLODipine  5 mg Oral Once   Chlorhexidine Gluconate Cloth  6 each Topical Daily   enoxaparin (LOVENOX) injection  40 mg Subcutaneous Q24H   gabapentin  600 mg Oral TID   insulin aspart  0-5 Units Subcutaneous QHS   insulin aspart  0-9 Units Subcutaneous TID WC   insulin detemir  5 Units Subcutaneous Daily   metoprolol succinate  50 mg Oral Daily   oxyCODONE-acetaminophen  1 tablet Oral Q4H   And   oxyCODONE  5 mg Oral Q4H   PRN Meds: acetaminophen, dextrose, hydrALAZINE, hydrOXYzine, metoprolol tartrate, ondansetron (ZOFRAN) IV Continuous Infusions:   LOS: 3 days  Time spent: Greater than 50% of the 35 minute visit was spent in counseling/coordination of care for the patient as laid out in the A&P.   Dwyane Dee,  MD [facility] 01/29/2021, 1:51 PM

## 2021-01-29 NOTE — Telephone Encounter (Signed)
Scheduled per sch msg. Will get printout when discharged

## 2021-01-29 NOTE — Progress Notes (Signed)
Inpatient Diabetes Program Recommendations  AACE/ADA: New Consensus Statement on Inpatient Glycemic Control (2015)  Target Ranges:  Prepandial:   less than 140 mg/dL      Peak postprandial:   less than 180 mg/dL (1-2 hours)      Critically ill patients:  140 - 180 mg/dL   Lab Results  Component Value Date   GLUCAP 135 (H) 01/29/2021   HGBA1C 12.0 (H) 01/26/2021    Review of Glycemic Control  Diabetes history: DM1 Outpatient Diabetes medications: Lantus 12 units QAM, Humalog 2-12 units TID Current orders for Inpatient glycemic control: Levemir 5 units QD, Novolog 0-9 units TID with meals and 0-5 HS  Hypos yesterday at HS - 59, 56. Had received 10 units of Novolog and 3 hours later received 5 units of Novolog. Pt was not eating yesterday afternoon.  HgbA1C - 12.0%  Inpatient Diabetes Program Recommendations:    Decrease Novolog to 0-6 units TID with meals and 0-5 HS.  Will need meal coverage insulin when eating well - Novolog 3 units TID with meals.  Continue to follow.  Thank you. Lorenda Peck, RD, LDN, CDE Inpatient Diabetes Coordinator [phone]

## 2021-01-29 NOTE — Progress Notes (Signed)
Inpatient Rehab Admissions Coordinator Note:   Per PT recommendation patient was screened for CIR candidacy by Michel Santee, PT. At this time, pt appears to be a potential candidate for CIR. I will place an order for rehab consult for full assessment, per our protocol.  Please contact me any with questions.Shann Medal, PT, DPT [phone] 01/29/21 4:11 PM

## 2021-01-29 NOTE — Evaluation (Signed)
Physical Therapy Evaluation Patient Details Name: Ann Robertson MRN: [id number] DOB: 23-Jun-1990 Today's Date: 01/29/2021   History of Present Illness  Ann Robertson is a 30 y.o. female with medical history significant for type 1 diabetes with peripheral neuropathy, nephrotic syndrome, CKD stage IIIa, hypertension, and anemia who presents with abdominal pain.  Admitted for DKA.  Clinical Impression  Patient presents with significant edema and weakness in LE's.  She was symptomatic with mobility up to EOB and standing though orthostatics negative.  She has history of peripheral neuropathy and with the swelling she may have even further limited neuromuscular control of her legs.  Feel she may need short CIR stay prior to d/c home with family support.  She hopes to be able to return to taking care of herself and her children.  PT to follow up.     Follow Up Recommendations CIR    Equipment Recommendations  Rolling walker with 5" wheels;3in1 (PT)    Recommendations for Other Services Rehab consult     Precautions / Restrictions Precautions Precautions: Fall      Mobility  Bed Mobility Overal bed mobility: Needs Assistance Bed Mobility: Supine to Sit;Sit to Supine     Supine to sit: Min assist Sit to supine: Mod assist   General bed mobility comments: hand hold to come up to sit, assist for both legs to supine    Transfers Overall transfer level: Needs assistance Equipment used: Rolling walker (2 wheeled) Transfers: Sit to/from Stand;Lateral/Scoot Transfers Sit to Stand: From elevated surface;Mod assist        Lateral/Scoot Transfers: Mod assist General transfer comment: attempted x 2 from EOB at standard height, pt began to slid forward with LE's not supporting so blocked knees and elevated bed height and able to stand to RW with min to mod A.  Patient c/o severe abdominal pain standing for orthostatic measurement, so returned to sitting, scooted to [facility] and assist for legs  into bed.  Ambulation/Gait             General Gait Details: unable  Stairs            Wheelchair Mobility    Modified Rankin (Stroke Patients Only)       Balance Overall balance assessment: Needs assistance Sitting-balance support: Feet supported Sitting balance-Leahy Scale: Fair Sitting balance - Comments: sat and supported her in sitting even though when moved to get walker she could sit on her own   Standing balance support: Bilateral upper extremity supported Standing balance-Leahy Scale: Poor Standing balance comment: UE supported and min A for balance while performing BP measurement                             Pertinent Vitals/Pain Pain Assessment: Faces Faces Pain Scale: Hurts worst Pain Location: abdomen with mobility and swollen joints Pain Descriptors / Indicators: Aching;Grimacing;Guarding Pain Intervention(s): Repositioned;Monitored during session;Limited activity within patient's tolerance    Home Living Family/patient expects to be discharged to:: Private residence Living Arrangements: Spouse/significant other;Children Available Help at Discharge: Family Type of Home: House Home Access: Stairs to enter Entrance Stairs-Rails: Right Entrance Stairs-Number of Steps: 4 Home Layout: One level Home Equipment: None      Prior Function Level of Independence: Independent         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, newspaper        Extremity/Trunk Assessment   Upper Extremity Assessment Upper Extremity Assessment: Generalized weakness  Lower Extremity Assessment Lower Extremity Assessment: RLE deficits/detail;LLE deficits/detail RLE Deficits / Details: hip flexion 2-/5, knee extension 3+/5, ankle DF 4-/5 RLE Sensation: history of peripheral neuropathy (states she has whole body neuropathy) RLE Coordination: decreased gross motor LLE Deficits / Details: hip flexion 2-/5, knee extension 3+/5, ankle DF 4-/5 LLE Sensation:  history of peripheral neuropathy (states she has whole body neuropathy) LLE Coordination: decreased gross motor       Communication   Communication: No difficulties  Cognition Arousal/Alertness: Awake/alert Behavior During Therapy: WFL for tasks assessed/performed Overall Cognitive Status: Within Functional Limits for tasks assessed                                        General Comments General comments (skin integrity, edema, etc.): BP [id number] systolic in supine, [id number] standing with some symptoms of light headedness    Exercises     Assessment/Plan    PT Assessment Patient needs continued PT services  PT Problem List Decreased strength;Decreased mobility;Decreased activity tolerance;Impaired sensation;Decreased balance;Decreased knowledge of use of DME;Decreased coordination       PT Treatment Interventions DME instruction;Therapeutic activities;Therapeutic exercise;Patient/family education;Gait training;Stair training;Balance training;Functional mobility training;Neuromuscular re-education;Wheelchair mobility training    PT Goals (Current goals can be found in the Care Plan section)  Acute Rehab PT Goals Patient Stated Goal: to get stronger to take care of herself and kids PT Goal Formulation: With patient Time For Goal Achievement: 02/12/21 Potential to Achieve Goals: Good    Frequency Min 3X/week   Barriers to discharge        Co-evaluation               AM-PAC PT "6 Clicks" Mobility  Outcome Measure Help needed turning from your back to your side while in a flat bed without using bedrails?: A Little Help needed moving from lying on your back to sitting on the side of a flat bed without using bedrails?: A Little Help needed moving to and from a bed to a chair (including a wheelchair)?: A Lot Help needed standing up from a chair using your arms (e.g., wheelchair or bedside chair)?: A Lot Help needed to walk in hospital room?: Total Help needed  climbing 3-5 steps with a railing? : Total 6 Click Score: 12    End of Session Equipment Utilized During Treatment: Gait belt Activity Tolerance: Patient limited by pain Patient left: in bed;with call bell/phone within reach;with bed alarm set;with family/visitor present   PT Visit Diagnosis: Other abnormalities of gait and mobility (R26.89);Muscle weakness (generalized) (M62.81);Other symptoms and signs involving the nervous system (R29.898)    Time: [id number] PT Time Calculation (min) (ACUTE ONLY): 25 min   Charges:   PT Evaluation $PT Eval Moderate Complexity: 1 Mod PT Treatments $Therapeutic Activity: 8-22 mins        Magda Kiel, PT Acute Rehabilitation Services [id number] [phone] 01/29/2021   Reginia Naas 01/29/2021, 2:59 PM

## 2021-01-30 LAB — GLUCOSE, CAPILLARY
Glucose-Capillary: 422 mg/dL — ABNORMAL HIGH (ref 70–99)
Glucose-Capillary: 438 mg/dL — ABNORMAL HIGH (ref 70–99)
Glucose-Capillary: 156 mg/dL — ABNORMAL HIGH (ref 70–99)
Glucose-Capillary: 133 mg/dL — ABNORMAL HIGH (ref 70–99)

## 2021-01-30 LAB — GLUCOSE, RANDOM: Glucose, Bld: 475 mg/dL — ABNORMAL HIGH (ref 70–99)

## 2021-01-30 MED ORDER — LABETALOL HCL 5 MG/ML IV SOLN
10.0000 mg | INTRAVENOUS | Status: DC | PRN
  Filled 2021-01-30: qty 4

## 2021-01-30 MED ORDER — INSULIN DETEMIR 100 UNIT/ML ~~LOC~~ SOLN
8.0000 [IU] | Freq: Every day | SUBCUTANEOUS | Status: DC
  Administered 2021-01-31: 8 [IU] via SUBCUTANEOUS
  Filled 2021-01-30 (×2): qty 0.08

## 2021-01-30 MED ORDER — CARVEDILOL 12.5 MG PO TABS
12.5000 mg | ORAL_TABLET | Freq: Two times a day (BID) | ORAL | Status: DC
  Administered 2021-01-30 – 2021-02-02 (×7): 12.5 mg via ORAL
  Filled 2021-01-30 (×7): qty 1

## 2021-01-30 MED ORDER — HYDRALAZINE HCL 25 MG PO TABS
25.0000 mg | ORAL_TABLET | ORAL | Status: DC | PRN
  Administered 2021-01-30: 25 mg via ORAL
  Filled 2021-01-30: qty 1

## 2021-01-30 MED ORDER — INSULIN DETEMIR 100 UNIT/ML ~~LOC~~ SOLN
3.0000 [IU] | Freq: Once | SUBCUTANEOUS | Status: AC
  Administered 2021-01-30: 3 [IU] via SUBCUTANEOUS
  Filled 2021-01-30 (×2): qty 0.03

## 2021-01-30 MED ORDER — INSULIN ASPART 100 UNIT/ML IJ SOLN
11.0000 [IU] | Freq: Once | INTRAMUSCULAR | Status: AC
  Administered 2021-01-30: 11 [IU] via SUBCUTANEOUS

## 2021-01-30 MED ORDER — INSULIN ASPART 100 UNIT/ML IJ SOLN
4.0000 [IU] | Freq: Three times a day (TID) | INTRAMUSCULAR | Status: DC
  Administered 2021-01-30 – 2021-02-02 (×7): 4 [IU] via SUBCUTANEOUS

## 2021-01-30 MED ORDER — ALUM & MAG HYDROXIDE-SIMETH 200-200-20 MG/5ML PO SUSP
15.0000 mL | Freq: Four times a day (QID) | ORAL | Status: DC | PRN
  Administered 2021-01-30 – 2021-02-02 (×4): 15 mL via ORAL
  Filled 2021-01-30 (×4): qty 30

## 2021-01-30 NOTE — Progress Notes (Signed)
Patient's CBG was [phone] at 0821 and Dr. Sabino Gasser was notified.  Patient's CBG was 438 at 1135 and Dr. Sabino Gasser was notified.

## 2021-01-30 NOTE — Evaluation (Signed)
Occupational Therapy Evaluation Patient Details Name: Ann Robertson MRN: [id number] DOB: 1990/09/10 Today's Date: 01/30/2021    History of Present Illness Ann Robertson is a 30 y.o. female with medical history significant for type 1 diabetes with peripheral neuropathy, nephrotic syndrome, CKD stage IIIa, hypertension, and anemia who presents with abdominal pain.  Admitted for DKA.   Clinical Impression   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 On evaluation she was predominantly min guard for ambulation with walker and standing. Patinet able to to don slippers and perform toileting - standing without upper extremity support to manage clothing. Patient mod assist to get her legs back in to bed. Patient reports this is due to edema in legs and that she has edema throughout her body causing all of her bodies making her extremities heavy and weak. Patient noted to have swollen feet and edema in lower extremities and mild edema noted in her hands. Patient overall presents with generalized weakness and poor activity tolerance but grossly able to perform all ADLs with assistance of walker. Patient reports just needing to improve her walking and get rid of the edema. She reports not occupational therapy needs and reports wanting to go home if they can get rid of the swelling. Therapist recommends shower chair for discharge and at least intermittent supervision.    Follow Up Recommendations  No OT follow up    Equipment Recommendations  Tub/shower seat    Recommendations for Other Services       Precautions / Restrictions Precautions Precautions: Fall Restrictions Weight Bearing Restrictions: No      Mobility Bed Mobility Overal bed mobility: Needs Assistance Bed Mobility: Supine to Sit;Sit to Supine     Supine to sit: Supervision;HOB elevated Sit to supine: Mod assist   General bed mobility comments:  Mod assist for LEs to transfer back in to bed. Patient reports her legs are heavier than normal. Patient then began crying reporting on the edmea throughout her body compared to her normal. Reports the edema and heaviness of limbs is her biggest issue.    Transfers Overall transfer level: Needs assistance Equipment used: Rolling walker (2 wheeled) Transfers: Sit to/from Stand Sit to Stand: Min guard        Lateral/Scoot Transfers: Min assist;Supervision General transfer comment: Min guard to stand from bed and ambulate to bathroom. use of walker and grab bar for transfers.    Balance Overall balance assessment: Mild deficits observed, not formally tested   Sitting balance-Leahy Scale: Good     Standing balance support: Single extremity supported;Bilateral upper extremity supported Standing balance-Leahy Scale: Poor Standing balance comment: LE's weak, so UE support for balance                           ADL either performed or assessed with clinical judgement   ADL Overall ADL's : Modified independent                                       General ADL Comments: Able to perform all tasks. needs increased time and walker.     Vision Patient Visual Report: No change from baseline       Perception     Praxis      Pertinent Vitals/Pain Pain Assessment: Faces Faces Pain  Scale: Hurts little more Pain Location: generalized Pain Descriptors / Indicators: Aching;Tender;Sore Pain Intervention(s): Monitored during session     Hand Dominance Right   Extremity/Trunk Assessment Upper Extremity Assessment Upper Extremity Assessment: Overall WFL for tasks assessed   Lower Extremity Assessment Lower Extremity Assessment: Defer to PT evaluation   Cervical / Trunk Assessment Cervical / Trunk Assessment: Normal   Communication Communication Communication: No difficulties   Cognition Arousal/Alertness: Awake/alert Behavior During Therapy: WFL for  tasks assessed/performed Overall Cognitive Status: Within Functional Limits for tasks assessed                                     General Comments  114 HR, SpO2 100% with ambulation on RA    Exercises General Exercises - Lower Extremity Ankle Circles/Pumps: AROM;5 reps;Supine;Both Heel Slides: AROM;5 reps;Supine;Both Other Exercises Other Exercises: bridging in bed x 5 reps w/ 5 sec hold   Shoulder Instructions      Home Living Family/patient expects to be discharged to:: Private residence Living Arrangements: Spouse/significant other;Children Available Help at Discharge: Family Type of Home: House Home Access: Stairs to enter Technical brewer of Steps: 4 Entrance Stairs-Rails: Right Home Layout: One level     Bathroom Shower/Tub: Teacher, early years/pre: Standard     Home Equipment: None          Prior Functioning/Environment Level of Independence: Independent        Comments: was working as a Quarry manager two weeks ago. has 4children and 2 dogs.        OT Problem List: Decreased activity tolerance;Increased edema;Impaired balance (sitting and/or standing);Impaired sensation      OT Treatment/Interventions:      OT Goals(Current goals can be found in the care plan section) Acute Rehab OT Goals OT Goal Formulation: All assessment and education complete, DC therapy  OT Frequency:     Barriers to D/C:            Co-evaluation              AM-PAC OT "6 Clicks" Daily Activity     Outcome Measure Help from another person eating meals?: None Help from another person taking care of personal grooming?: None Help from another person toileting, which includes using toliet, bedpan, or urinal?: None Help from another person bathing (including washing, rinsing, drying)?: None Help from another person to put on and taking off regular upper body clothing?: None Help from another person to put on and taking off regular lower body  clothing?: None 6 Click Score: 24   End of Session Equipment Utilized During Treatment: Rolling walker Nurse Communication: Other (comment) (okay to see)  Activity Tolerance: Patient tolerated treatment well Patient left: in bed;with call bell/phone within reach  OT Visit Diagnosis: Unsteadiness on feet (R26.81);Muscle weakness (generalized) (M62.81);Pain                Time: 1547-1610 OT Time Calculation (min): 23 min Charges:  OT General Charges $OT Visit: 1 Visit OT Evaluation $OT Eval Low Complexity: 1 Low  Kallen Delatorre, OTR/L [place]  Office [phone] Pager: [place] 01/30/2021, 4:24 PM

## 2021-01-30 NOTE — Plan of Care (Signed)

## 2021-01-30 NOTE — Progress Notes (Signed)
Physical Therapy Treatment Patient Details Name: Ann Robertson MRN: [id number] DOB: [birth year] Today's Date: 01/30/2021    History of Present Illness Ann Robertson is a 30 y.o. female with medical history significant for type 1 diabetes with peripheral neuropathy, nephrotic syndrome, CKD stage IIIa, hypertension, and anemia who presents with abdominal pain.  Admitted for DKA.    PT Comments    Patient progressing to hallway ambulation this session. Feel she should be able to d/c home with fiance' and elder child assistance.  Feel she would greatly benefit from HHPT follow up as noted below.  She will need RW and 3:1 for home.  PT to follow if not d/c.    Follow Up Recommendations  Home health PT (if able to get charity care)     Equipment Recommendations  Rolling walker with 5" wheels;3in1 (PT)    Recommendations for Other Services       Precautions / Restrictions Precautions Precautions: Fall    Mobility  Bed Mobility               General bed mobility comments: up on EOB with NT getting to [facility]    Transfers Overall transfer level: Needs assistance Equipment used: Rolling walker (2 wheeled) Transfers: Sit to/from Stand          Lateral/Scoot Transfers: Min assist;Supervision General transfer comment: initially min A to stand from EOB with NT assisting with pivot to BSC no AD, with RW able to stand step to bed with min A then stood from recliner with close S cues for hand placement  Ambulation/Gait Ambulation/Gait assistance: Min guard;Min assist;+2 safety/equipment Gait Distance (Feet): 60 Feet (&60 & 120') Assistive device: Rolling walker (2 wheeled) Gait Pattern/deviations: Step-through pattern;Decreased stride length;Shuffle     General Gait Details: shuffling feet in her slippers, reliant on RW, sits quickly when fatigued so +2 to chair needed for safety   Stairs             Wheelchair Mobility    Modified Rankin (Stroke Patients Only)        Balance Overall balance assessment: Needs assistance   Sitting balance-Leahy Scale: Good     Standing balance support: Single extremity supported;Bilateral upper extremity supported Standing balance-Leahy Scale: Poor Standing balance comment: LE's weak, so UE support for balance                            Cognition Arousal/Alertness: Awake/alert Behavior During Therapy: WFL for tasks assessed/performed Overall Cognitive Status: Within Functional Limits for tasks assessed                                        Exercises General Exercises - Lower Extremity Ankle Circles/Pumps: AROM;5 reps;Supine;Both Heel Slides: AROM;5 reps;Supine;Both Other Exercises Other Exercises: bridging in bed x 5 reps w/ 5 sec hold    General Comments General comments (skin integrity, edema, etc.): 114 HR, SpO2 100% with ambulation on RA      Pertinent Vitals/Pain Pain Assessment: Faces Faces Pain Scale: Hurts little more Pain Location: generalized Pain Descriptors / Indicators: Aching;Tender Pain Intervention(s): Monitored during session    Home Living                      Prior Function            PT Goals (current goals can now  be found in the care plan section) Progress towards PT goals: Progressing toward goals    Frequency    Min 3X/week      PT Plan Discharge plan needs to be updated    Co-evaluation              AM-PAC PT "6 Clicks" Mobility   Outcome Measure  Help needed turning from your back to your side while in a flat bed without using bedrails?: A Little Help needed moving from lying on your back to sitting on the side of a flat bed without using bedrails?: A Little Help needed moving to and from a bed to a chair (including a wheelchair)?: A Little Help needed standing up from a chair using your arms (e.g., wheelchair or bedside chair)?: A Little Help needed to walk in hospital room?: A Little Help needed climbing  3-5 steps with a railing? : A Lot 6 Click Score: 17    End of Session   Activity Tolerance: Patient limited by fatigue Patient left: in bed;with call bell/phone within reach (seated EOB to eat lunch)   PT Visit Diagnosis: Other abnormalities of gait and mobility (R26.89);Muscle weakness (generalized) (M62.81);Other symptoms and signs involving the nervous system (R29.898)     Time: 1325-1405 (& [id number]) PT Time Calculation (min) (ACUTE ONLY): 40 min  Charges:  $Gait Training: 8-22 mins $Therapeutic Exercise: 8-22 mins $Therapeutic Activity: 8-22 mins                     Magda Kiel, PT Acute Rehabilitation Services Pager:[phone] Office:[phone] 01/30/2021    Ann Robertson 01/30/2021, 2:19 PM

## 2021-01-30 NOTE — Progress Notes (Signed)
Inpatient Diabetes Program Recommendations  AACE/ADA: New Consensus Statement on Inpatient Glycemic Control (2015)  Target Ranges:  Prepandial:   less than 140 mg/dL      Peak postprandial:   less than 180 mg/dL (1-2 hours)      Critically ill patients:  140 - 180 mg/dL   Lab Results  Component Value Date   GLUCAP 438 (H) 01/30/2021   HGBA1C 12.0 (H) 01/26/2021    Review of Glycemic Control  Diabetes history: DM1 Outpatient Diabetes medications: Lantus 12 units QAM, Humalog 2-12 units TID Current orders for Inpatient glycemic control: Levemir 8 units QD (starting 9/3) Levemir 3 units x 1 now, Novolog 0-9 units TID with meals and 0-5 HS + 4 units TID  HgbA1C - 12%  Inpatient Diabetes Program Recommendations:    Continue to follow closely. Uptitrate daily if needed.  Continue to follow.  Thank you. Lorenda Peck, RD, LDN, CDE Inpatient Diabetes Coordinator [phone]

## 2021-01-30 NOTE — Progress Notes (Signed)
Inpatient Rehab Admissions Coordinator:   CIR consult received. Pt. Currently with PT needs only. Pt. Will need to have needs with both PT and OT. OT has not evaluated yet. Requested OT orders from acute MD. I will evaluate for candidacy once seen by OT.   Clemens Catholic, [place], [place] Admissions Coordinator  [phone] ([place]) [phone] (office)

## 2021-01-30 NOTE — Progress Notes (Signed)
Progress Note    Ann Robertson   NOI:[id number]  DOB: [birth year]  DOA: 01/26/2021     4  PCP: [place]  Initial CC: abd pain  Hospital Course: Ann Robertson is a 30 y.o. female with medical history significant for  T1DM W/ peripheral neuropathy, nephrotic syndrome, CKD stage IIIa, HTN, and anemia who presented with abdominal pain.  Patient reports that she awoke 8/29 AM feeling short of breath with severe epigastric abdominal pain. Pain has been chronic for years,has been told by her PCP that it was due to her anemia.Pain comes and goes and is sharp, crampy and dull. Pain worse after she urinates but no dysuria. Feels nauseous but no vomiting. No diarrhea. No fever. Reports she is on a regimen of 7 Units of Lantus daily with sliding scale. Takes 7 units "whenever she feels like it" during the day. Glucometer often reads > 500 and sometimes in the 200-400s.  Hx of 4 C-sections in the past. Reports tobacco use of 3-4 cigarettes per day, no alcohol or illicit drug use. Underwent work-up in the ER and was found to be in DKA and started on protocol. DKA resolved with treatment and she was transitioned to subcutaneous insulins.  Interval History:  Patient noted to be eating french toast with significant amount of syrup on it this morning.  Subsequently her CBG had escalated up to the 400s which was not unexpected.  She asked for her diet to be liberated yesterday and was still recommended to make good choices on food however she does not appear to follow this and even states that at home she "eats what she wants to eat" and covers herself with insulin.  We are now awaiting CIR evaluation as well, as she is too weak for discharging home.  ROS: Constitutional: negative for chills and fevers, Respiratory: negative for cough, Cardiovascular: negative for chest pain, and Gastrointestinal: positive for abdominal pain, negative for constipation  Assessment & Plan: * DKA (diabetic  ketoacidosis) (HCC)-resolved as of 01/28/2021 - Likely due to noncompliance with insulins at home.  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  Has been higher than this in the past.  Patient does not seem to have good understanding of the importance of maintaining consistent eating habits as well as insulin compliance - Continue basal insulin, adjusting as needed - patient requested regular diet ("eats what she wants at home"); have changed her diet to regular to better be able to dose her insulin as she is expected to not follow a diabetic diet at home unfortunately - resume prandial insulin and continue SSI - CBGs have elevated as expected as she has had her diet advanced and inevitably not following recommendations  Peripheral neuropathy - Continue gabapentin  HTN (hypertension) - s/p metoprolol (see below) - starting amlodipine due to uncontrolled pressures  - changed metoprolol to coreg on 9/2 - would benefit from lisinopril but awaiting renal function to further normalize; has had wosening proteinuria since April 2022  Acute renal failure superimposed on stage 3a chronic kidney disease ([place]) - patient has history of CKD3a. Baseline creat ~ 1.4 - 1.7, eGFR 47 - 51 - patient presents with increase in creat >0.3 mg/dL above baseline, creat increase >1.5x baseline presumed to have occurred within past 7 days PTA - improved with IVF - at risk for progressive CKD due to uncontrolled DM  Anemia - stable Hgb  Abdominal pain, acute, epigastric - Chronic pain per patient.  Reports that she is on chronic opiates as well (percocet thru pain clinic) - Suspect there may be some underlying gastroparesis contributing given severely uncontrolled diabetes at baseline  Hypertensive urgency-resolved as of 01/28/2021 - Patient initially refused hydralazine - now  controlled on current regimen  Orthostatic hypotension-resolved as of 01/28/2021 - Resolved with fluids   Old records reviewed in assessment of this patient  Antimicrobials:   DVT prophylaxis: enoxaparin (LOVENOX) injection 40 mg Start: 01/27/21 2200   Code Status:   Code Status: Full Code Family Communication: significant other  Disposition Plan: Status is: Inpatient  Remains inpatient appropriate because:Inpatient level of care appropriate due to severity of illness  Dispo: The patient is from: Home              Anticipated d/c is to:  CIR versus SNF              Patient currently is medically stable to d/c.   Difficult to place patient No  Risk of unplanned readmission score: Unplanned Admission- Pilot do not use: 17.38   Objective: Blood pressure 112/76, pulse (!) 105, temperature 98.2 F (36.8 C), temperature source Oral, resp. rate 18, height $RemoveBe'5\' 5"'zesEhjHYt$  (1.651 m), weight 50.4 kg, SpO2 100 %.  Examination: General appearance: alert, cooperative, and no distress Head: Normocephalic, without obvious abnormality, atraumatic Eyes:  EOMI Lungs: clear to auscultation bilaterally Heart: regular rate and rhythm and S1, S2 normal Abdomen: normal findings: bowel sounds normal and spleen non-palpable Extremities:  No edema in any extremities Skin: mobility and turgor normal Neurologic: Grossly normal  Consultants:    Procedures:    Data Reviewed: I have personally reviewed following labs and imaging studies Results for orders placed or performed during the hospital encounter of 01/26/21 (from the past 24 hour(s))  Glucose, capillary     Status: None   Collection Time: 01/29/21  4:56 PM  Result Value Ref Range   Glucose-Capillary 79 70 - 99 mg/dL  Glucose, capillary     Status: Abnormal   Collection Time: 01/29/21  9:14 PM  Result Value Ref Range   Glucose-Capillary 139 (H) 70 - 99 mg/dL  Glucose, capillary     Status: Abnormal   Collection Time: 01/30/21  8:21 AM   Result Value Ref Range   Glucose-Capillary 422 (H) 70 - 99 mg/dL  Glucose, random     Status: Abnormal   Collection Time: 01/30/21  8:43 AM  Result Value Ref Range   Glucose, Bld 475 (H) 70 - 99 mg/dL  Glucose, capillary     Status: Abnormal   Collection Time: 01/30/21 11:35 AM  Result Value Ref Range   Glucose-Capillary 438 (H) 70 - 99 mg/dL    Recent Results (from the past 240 hour(s))  Resp Panel by RT-PCR (Flu A&B, Covid) Nasopharyngeal Swab     Status: None   Collection Time: 01/26/21  5:03 PM   Specimen: Nasopharyngeal Swab; Nasopharyngeal(NP) swabs in vial transport medium  Result Value Ref Range Status   SARS Coronavirus 2 by RT PCR NEGATIVE NEGATIVE Final    Comment: (NOTE) SARS-CoV-2 target nucleic acids are NOT DETECTED.  The SARS-CoV-2 RNA is generally detectable in upper respiratory specimens during the acute phase of infection. The lowest concentration of SARS-CoV-2 viral copies this assay can detect is 138 copies/mL. A negative result does not preclude SARS-Cov-2 infection and should not be used as the sole basis for treatment  or other patient management decisions. A negative result may occur with  improper specimen collection/handling, submission of specimen other than nasopharyngeal swab, presence of viral mutation(s) within the areas targeted by this assay, and inadequate number of viral copies(<138 copies/mL). A negative result must be combined with clinical observations, patient history, and epidemiological information. The expected result is Negative.  Fact Sheet for Patients:  EntrepreneurPulse.com.au  Fact Sheet for Healthcare Providers:  IncredibleEmployment.be  This test is no t yet approved or cleared by the [place] FDA and  has been authorized for detection and/or diagnosis of SARS-CoV-2 by FDA under an Emergency Use Authorization (EUA). This EUA will remain  in effect (meaning this test can be used) for  the duration of the COVID-19 declaration under Section 564(b)(1) of the Act, 21 U.S.C.section 360bbb-3(b)(1), unless the authorization is terminated  or revoked sooner.       Influenza A by PCR NEGATIVE NEGATIVE Final   Influenza B by PCR NEGATIVE NEGATIVE Final    Comment: (NOTE) The Xpert Xpress SARS-CoV-2/FLU/RSV plus assay is intended as an aid in the diagnosis of influenza from Nasopharyngeal swab specimens and should not be used as a sole basis for treatment. Nasal washings and aspirates are unacceptable for Xpert Xpress SARS-CoV-2/FLU/RSV testing.  Fact Sheet for Patients: EntrepreneurPulse.com.au  Fact Sheet for Healthcare Providers: IncredibleEmployment.be  This test is not yet approved or cleared by the [place] FDA and has been authorized for detection and/or diagnosis of SARS-CoV-2 by FDA under an Emergency Use Authorization (EUA). This EUA will remain in effect (meaning this test can be used) for the duration of the COVID-19 declaration under Section 564(b)(1) of the Act, 21 U.S.C. section 360bbb-3(b)(1), unless the authorization is terminated or revoked.  Performed at [facility], [place] [address] [place], [place] [address]   MRSA Next Gen by PCR, Nasal     Status: None   Collection Time: 01/26/21 11:01 PM   Specimen: Nasal Mucosa; Nasal Swab  Result Value Ref Range Status   MRSA by PCR Next Gen NOT DETECTED NOT DETECTED Final    Comment: (NOTE) The GeneXpert MRSA Assay (FDA approved for NASAL specimens only), is one component of a comprehensive MRSA colonization surveillance program. It is not intended to diagnose MRSA infection nor to guide or monitor treatment for MRSA infections. Test performance is not FDA approved in patients less than 89 years old. Performed at [facility], [place] [address] [place], [place] [address]      Radiology Studies: No results found. DG Chest  Portable 1 View  Final Result      Scheduled Meds:  amLODipine  10 mg Oral Daily   carvedilol  12.5 mg Oral BID WC   Chlorhexidine Gluconate Cloth  6 each Topical Daily   enoxaparin (LOVENOX) injection  40 mg Subcutaneous Q24H   gabapentin  600 mg Oral TID   insulin aspart  0-5 Units Subcutaneous QHS   insulin aspart  0-9 Units Subcutaneous TID WC   insulin aspart  4 Units Subcutaneous TID WC   [START ON 01/31/2021] insulin detemir  8 Units Subcutaneous Daily   oxyCODONE-acetaminophen  1 tablet Oral Q4H   And   oxyCODONE  5 mg Oral Q4H   PRN Meds: acetaminophen, dextrose, hydrALAZINE, labetalol, ondansetron (ZOFRAN) IV Continuous Infusions:   LOS: 4 days  Time spent: Greater than 50% of the 35 minute visit was spent in counseling/coordination of care for the patient as laid out in the A&P.  Dwyane Dee, MD [facility] 01/30/2021, 2:08 PM

## 2021-01-31 ENCOUNTER — Inpatient Hospital Stay (HOSPITAL_COMMUNITY): Payer: Medicaid Other

## 2021-01-31 DIAGNOSIS — R601 Generalized edema: Secondary | ICD-10-CM

## 2021-01-31 LAB — CBC WITH DIFFERENTIAL/PLATELET
Abs Immature Granulocytes: 0.03 10*3/uL (ref 0.00–0.07)
Basophils Absolute: 0 10*3/uL (ref 0.0–0.1)
Basophils Relative: 1 %
Eosinophils Absolute: 0.1 10*3/uL (ref 0.0–0.5)
Eosinophils Relative: 3 %
HCT: 25.1 % — ABNORMAL LOW (ref 36.0–46.0)
Hemoglobin: 8.5 g/dL — ABNORMAL LOW (ref 12.0–15.0)
Immature Granulocytes: 1 %
Lymphocytes Relative: 35 %
Lymphs Abs: 1.5 10*3/uL (ref 0.7–4.0)
MCH: 29.6 pg (ref 26.0–34.0)
MCHC: 33.9 g/dL (ref 30.0–36.0)
MCV: 87.5 fL (ref 80.0–100.0)
Monocytes Absolute: 0.2 10*3/uL (ref 0.1–1.0)
Monocytes Relative: 5 %
Neutro Abs: 2.4 10*3/uL (ref 1.7–7.7)
Neutrophils Relative %: 55 %
Platelets: 413 10*3/uL — ABNORMAL HIGH (ref 150–400)
RBC: 2.87 MIL/uL — ABNORMAL LOW (ref 3.87–5.11)
RDW: 13.8 % (ref 11.5–15.5)
WBC: 4.3 10*3/uL (ref 4.0–10.5)
nRBC: 0 % (ref 0.0–0.2)

## 2021-01-31 LAB — GLUCOSE, CAPILLARY
Glucose-Capillary: 125 mg/dL — ABNORMAL HIGH (ref 70–99)
Glucose-Capillary: 212 mg/dL — ABNORMAL HIGH (ref 70–99)
Glucose-Capillary: 189 mg/dL — ABNORMAL HIGH (ref 70–99)
Glucose-Capillary: 89 mg/dL (ref 70–99)
Glucose-Capillary: 49 mg/dL — ABNORMAL LOW (ref 70–99)
Glucose-Capillary: 71 mg/dL (ref 70–99)
Glucose-Capillary: 97 mg/dL (ref 70–99)

## 2021-01-31 LAB — BASIC METABOLIC PANEL
Anion gap: 4 — ABNORMAL LOW (ref 5–15)
BUN: 38 mg/dL — ABNORMAL HIGH (ref 6–20)
CO2: 25 mmol/L (ref 22–32)
Calcium: 8.2 mg/dL — ABNORMAL LOW (ref 8.9–10.3)
Chloride: 105 mmol/L (ref 98–111)
Creatinine, Ser: 2.09 mg/dL — ABNORMAL HIGH (ref 0.44–1.00)
GFR, Estimated: 32 mL/min — ABNORMAL LOW (ref >60)
Glucose, Bld: 237 mg/dL — ABNORMAL HIGH (ref 70–99)
Potassium: 4 mmol/L (ref 3.5–5.1)
Sodium: 134 mmol/L — ABNORMAL LOW (ref 135–145)

## 2021-01-31 LAB — MAGNESIUM: Magnesium: 1.7 mg/dL (ref 1.7–2.4)

## 2021-01-31 MED ORDER — FUROSEMIDE 40 MG PO TABS
40.0000 mg | ORAL_TABLET | Freq: Two times a day (BID) | ORAL | Status: AC
  Administered 2021-01-31 – 2021-02-01 (×2): 40 mg via ORAL
  Filled 2021-01-31 (×2): qty 1

## 2021-01-31 MED ORDER — FUROSEMIDE 10 MG/ML IJ SOLN: 40.0000 mg | Freq: Two times a day (BID) | INTRAMUSCULAR | Status: DC

## 2021-01-31 MED ORDER — FUROSEMIDE 40 MG PO TABS
60.0000 mg | ORAL_TABLET | Freq: Once | ORAL | Status: AC
  Administered 2021-01-31: 60 mg via ORAL
  Filled 2021-01-31: qty 1

## 2021-01-31 NOTE — Progress Notes (Signed)
Pt complained of feeling tight. Edema noted on BLE and sacral area. MD paged and ordered '60mg'$  po lasix , same administered. Patient not voiding since last administration of po lasix. MD notified again about no voiding. Waiting for orders. Pt's Blood glucose was 89 this pm, SSI omitted and offered orange juice.  She continues to complaint of abdominal pain. Refuses to take her Oxycodone or percocet. PRN tylenol given with minimal effect.

## 2021-01-31 NOTE — Progress Notes (Signed)
Order to bladder scan pt, 153m of urine recorded by scanner. MD made aware

## 2021-01-31 NOTE — Progress Notes (Signed)
Progress Note    Ann Robertson   BOF:[id number]  DOB: June 14, 1990  DOA: 01/26/2021     5  PCP: [facility] [place]  Initial CC: abd pain  Hospital Course: [name] is a 30 y.o. female with medical history significant for  T1DM W/ peripheral neuropathy, nephrotic syndrome, CKD stage IIIa, HTN, and anemia who presented with abdominal pain.  Patient reports that she awoke 8/29 AM feeling short of breath with severe epigastric abdominal pain. Pain has been chronic for years,has been told by her PCP that it was due to her anemia.Pain comes and goes and is sharp, crampy and dull. Pain worse after she urinates but no dysuria. Feels nauseous but no vomiting. No diarrhea. No fever. Reports she is on a regimen of 7 Units of Lantus daily with sliding scale. Takes 7 units "whenever she feels like it" during the day. Glucometer often reads > 500 and sometimes in the 200-400s.  Hx of 4 C-sections in the past. Reports tobacco use of 3-4 cigarettes per day, no alcohol or illicit drug use. Underwent work-up in the ER and was found to be in DKA and started on protocol. DKA resolved with treatment and she was transitioned to subcutaneous insulins.  Interval History:  No events overnight.  Evaluated by therapy and CIR yesterday.  Plan at this time is for patient to return home with possible home health.  She is okay with this plan and is tentatively planning for tomorrow discharging. She is concerned about the edema on her body after having received fluids on admission.  Discussed that we would order Lasix today and repeat blood work in a.m.  ROS: Constitutional: negative for chills and fevers, Respiratory: negative for cough, Cardiovascular: negative for chest pain, and Gastrointestinal: positive for abdominal pain, negative for constipation  Assessment & Plan: * DKA (diabetic ketoacidosis) (HCC)-resolved as of 01/28/2021 - Likely due to noncompliance with insulins at home.  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  Has been higher than this in the past.  Patient does not seem to have good understanding of the importance of maintaining consistent eating habits as well as insulin compliance - Continue basal insulin, adjusting as needed - patient requested regular diet ("eats what she wants at home"); have changed her diet to regular to better be able to dose her insulin as she is expected to not follow a diabetic diet at home unfortunately - resume prandial insulin and continue SSI - CBGs have elevated as expected as she has had her diet advanced and inevitably not following recommendations  Generalized edema - likely from volume resuscitation on admission with hypoalbuminemia and some third spacing - Lasix initiated today for trial - Check BMP in a.m.  Peripheral neuropathy - Continue gabapentin  HTN (hypertension) - s/p metoprolol (see below) - starting amlodipine due to uncontrolled pressures  - changed metoprolol to coreg on 9/2 - would benefit from lisinopril but awaiting renal function to further normalize; has had wosening proteinuria since April 2022  Acute renal failure superimposed on stage 3a chronic kidney disease ([place]) - patient has history of CKD3a. Baseline creat ~ 1.4 - 1.7, eGFR 47 - 51 - patient presents with increase in creat >0.3 mg/dL above baseline, creat increase >1.5x baseline presumed to have occurred within past 7 days PTA - improved with IVF - at risk for progressive CKD due to uncontrolled  DM  Anemia - stable Hgb  Abdominal pain, acute, epigastric - Chronic pain per patient.  Reports that she is on chronic opiates as well (percocet thru pain clinic) - Suspect there may be some underlying gastroparesis contributing given severely uncontrolled diabetes at baseline  Hypertensive urgency-resolved as of  01/28/2021 - Patient initially refused hydralazine - now controlled on current regimen  Orthostatic hypotension-resolved as of 01/28/2021 - Resolved with fluids   Old records reviewed in assessment of this patient  Antimicrobials:   DVT prophylaxis: enoxaparin (LOVENOX) injection 40 mg Start: 01/27/21 2200   Code Status:   Code Status: Full Code Family Communication: significant other  Disposition Plan: Status is: Inpatient  Remains inpatient appropriate because:Inpatient level of care appropriate due to severity of illness  Dispo: The patient is from: Home              Anticipated d/c is to: Home              Patient currently is medically stable to d/c.   Difficult to place patient No  Risk of unplanned readmission score: Unplanned Admission- Pilot do not use: 19.86   Objective: Blood pressure (!) 130/93, pulse (!) 108, temperature 98.9 F (37.2 C), temperature source Oral, resp. rate 18, height 5' 5"  (1.651 m), weight 56.2 kg, SpO2 100 %.  Examination: General appearance: alert, cooperative, and no distress Head: Normocephalic, without obvious abnormality, atraumatic Eyes:  EOMI Lungs: clear to auscultation bilaterally Heart: regular rate and rhythm and S1, S2 normal Abdomen: normal findings: bowel sounds normal and spleen non-palpable Extremities:  Generalized minimal pitting edema in extremities, abdomen, back Skin: mobility and turgor normal Neurologic: Grossly normal  Consultants:    Procedures:    Data Reviewed: I have personally reviewed following labs and imaging studies Results for orders placed or performed during the hospital encounter of 01/26/21 (from the past 24 hour(s))  Glucose, capillary     Status: Abnormal   Collection Time: 01/30/21  4:42 PM  Result Value Ref Range   Glucose-Capillary 156 (H) 70 - 99 mg/dL  Glucose, capillary     Status: Abnormal   Collection Time: 01/30/21 10:05 PM  Result Value Ref Range   Glucose-Capillary 133 (H) 70  - 99 mg/dL  Glucose, capillary     Status: Abnormal   Collection Time: 01/31/21  5:53 AM  Result Value Ref Range   Glucose-Capillary 125 (H) 70 - 99 mg/dL  Glucose, capillary     Status: Abnormal   Collection Time: 01/31/21  7:49 AM  Result Value Ref Range   Glucose-Capillary 212 (H) 70 - 99 mg/dL  Basic metabolic panel     Status: Abnormal   Collection Time: 01/31/21  8:00 AM  Result Value Ref Range   Sodium 134 (L) 135 - 145 mmol/L   Potassium 4.0 3.5 - 5.1 mmol/L   Chloride 105 98 - 111 mmol/L   CO2 25 22 - 32 mmol/L   Glucose, Bld 237 (H) 70 - 99 mg/dL   BUN 38 (H) 6 - 20 mg/dL   Creatinine, Ser 2.09 (H) 0.44 - 1.00 mg/dL   Calcium 8.2 (L) 8.9 - 10.3 mg/dL   GFR, Estimated 32 (L) >60 mL/min   Anion gap 4 (L) 5 - 15  CBC with Differential/Platelet     Status: Abnormal   Collection Time: 01/31/21  8:00 AM  Result Value Ref Range   WBC 4.3 4.0 - 10.5 K/uL   RBC 2.87 (L) 3.87 - 5.11 MIL/uL  Hemoglobin 8.5 (L) 12.0 - 15.0 g/dL   HCT 25.1 (L) 36.0 - 46.0 %   MCV 87.5 80.0 - 100.0 fL   MCH 29.6 26.0 - 34.0 pg   MCHC 33.9 30.0 - 36.0 g/dL   RDW 13.8 11.5 - 15.5 %   Platelets 413 (H) 150 - 400 K/uL   nRBC 0.0 0.0 - 0.2 %   Neutrophils Relative % 55 %   Neutro Abs 2.4 1.7 - 7.7 K/uL   Lymphocytes Relative 35 %   Lymphs Abs 1.5 0.7 - 4.0 K/uL   Monocytes Relative 5 %   Monocytes Absolute 0.2 0.1 - 1.0 K/uL   Eosinophils Relative 3 %   Eosinophils Absolute 0.1 0.0 - 0.5 K/uL   Basophils Relative 1 %   Basophils Absolute 0.0 0.0 - 0.1 K/uL   Immature Granulocytes 1 %   Abs Immature Granulocytes 0.03 0.00 - 0.07 K/uL  Magnesium     Status: None   Collection Time: 01/31/21  8:00 AM  Result Value Ref Range   Magnesium 1.7 1.7 - 2.4 mg/dL  Glucose, capillary     Status: Abnormal   Collection Time: 01/31/21 11:27 AM  Result Value Ref Range   Glucose-Capillary 189 (H) 70 - 99 mg/dL    Recent Results (from the past 240 hour(s))  Resp Panel by RT-PCR (Flu A&B, Covid)  Nasopharyngeal Swab     Status: None   Collection Time: 01/26/21  5:03 PM   Specimen: Nasopharyngeal Swab; Nasopharyngeal(NP) swabs in vial transport medium  Result Value Ref Range Status   SARS Coronavirus 2 by RT PCR NEGATIVE NEGATIVE Final    Comment: (NOTE) SARS-CoV-2 target nucleic acids are NOT DETECTED.  The SARS-CoV-2 RNA is generally detectable in upper respiratory specimens during the acute phase of infection. The lowest concentration of SARS-CoV-2 viral copies this assay can detect is 138 copies/mL. A negative result does not preclude SARS-Cov-2 infection and should not be used as the sole basis for treatment or other patient management decisions. A negative result may occur with  improper specimen collection/handling, submission of specimen other than nasopharyngeal swab, presence of viral mutation(s) within the areas targeted by this assay, and inadequate number of viral copies(<138 copies/mL). A negative result must be combined with clinical observations, patient history, and epidemiological information. The expected result is Negative.  Fact Sheet for Patients:  EntrepreneurPulse.com.au  Fact Sheet for Healthcare Providers:  IncredibleEmployment.be  This test is no t yet approved or cleared by the [place] FDA and  has been authorized for detection and/or diagnosis of SARS-CoV-2 by FDA under an Emergency Use Authorization (EUA). This EUA will remain  in effect (meaning this test can be used) for the duration of the COVID-19 declaration under Section 564(b)(1) of the Act, 21 U.S.C.section 360bbb-3(b)(1), unless the authorization is terminated  or revoked sooner.       Influenza A by PCR NEGATIVE NEGATIVE Final   Influenza B by PCR NEGATIVE NEGATIVE Final    Comment: (NOTE) The Xpert Xpress SARS-CoV-2/FLU/RSV plus assay is intended as an aid in the diagnosis of influenza from Nasopharyngeal swab specimens and should not be  used as a sole basis for treatment. Nasal washings and aspirates are unacceptable for Xpert Xpress SARS-CoV-2/FLU/RSV testing.  Fact Sheet for Patients: EntrepreneurPulse.com.au  Fact Sheet for Healthcare Providers: IncredibleEmployment.be  This test is not yet approved or cleared by the [place] FDA and has been authorized for detection and/or diagnosis of SARS-CoV-2 by FDA under an Emergency Use  Authorization (EUA). This EUA will remain in effect (meaning this test can be used) for the duration of the COVID-19 declaration under Section 564(b)(1) of the Act, 21 U.S.C. section 360bbb-3(b)(1), unless the authorization is terminated or revoked.  Performed at [facility], [place] [address] [place], [place] [address]   MRSA Next Gen by PCR, Nasal     Status: None   Collection Time: 01/26/21 11:01 PM   Specimen: Nasal Mucosa; Nasal Swab  Result Value Ref Range Status   MRSA by PCR Next Gen NOT DETECTED NOT DETECTED Final    Comment: (NOTE) The GeneXpert MRSA Assay (FDA approved for NASAL specimens only), is one component of a comprehensive MRSA colonization surveillance program. It is not intended to diagnose MRSA infection nor to guide or monitor treatment for MRSA infections. Test performance is not FDA approved in patients less than 69 years old. Performed at [facility], [place] [address], [place], [place] [address]      Radiology Studies: No results found. DG Chest Portable 1 View  Final Result      Scheduled Meds:  amLODipine  10 mg Oral Daily   carvedilol  12.5 mg Oral BID WC   Chlorhexidine Gluconate Cloth  6 each Topical Daily   enoxaparin (LOVENOX) injection  40 mg Subcutaneous Q24H   furosemide  40 mg Oral BID   furosemide  60 mg Oral Once   gabapentin  600 mg Oral TID   insulin aspart  0-5 Units Subcutaneous QHS   insulin aspart  0-9 Units Subcutaneous TID WC   insulin aspart  4  Units Subcutaneous TID WC   insulin detemir  8 Units Subcutaneous Daily   oxyCODONE-acetaminophen  1 tablet Oral Q4H   And   oxyCODONE  5 mg Oral Q4H   PRN Meds: acetaminophen, alum & mag hydroxide-simeth, dextrose, hydrALAZINE, labetalol, ondansetron (ZOFRAN) IV Continuous Infusions:   LOS: 5 days  Time spent: Greater than 50% of the 35 minute visit was spent in counseling/coordination of care for the patient as laid out in the A&P.   Dwyane Dee, MD [facility] 01/31/2021, 12:33 PM

## 2021-01-31 NOTE — Assessment & Plan Note (Addendum)
-   likely from volume resuscitation on admission with hypoalbuminemia and some third spacing -Minimal response to Lasix - Patient recommended to increase protein intake at discharge to aid with her underlying hypoalbuminemia contributing to third spacing.  Recommendations were given for foods rich in protein and diabetic friendly

## 2021-01-31 NOTE — Progress Notes (Signed)
Inpatient Rehab Admissions Coordinator:   PT. Is recommending [place] and OT is recommending no follow up. Pt. No longer demonstrates need for intensive rehab in 2 or more rehab disciplines so I will not pursue for CIR admit. CIR will sign off.   Clemens Catholic, [place], [place] Admissions Coordinator  [phone] ([place]) [phone] (office)

## 2021-02-01 LAB — CBC WITH DIFFERENTIAL/PLATELET
Abs Immature Granulocytes: 0.06 10*3/uL (ref 0.00–0.07)
Basophils Absolute: 0 10*3/uL (ref 0.0–0.1)
Basophils Relative: 1 %
Eosinophils Absolute: 0.1 10*3/uL (ref 0.0–0.5)
Eosinophils Relative: 2 %
HCT: 22.2 % — ABNORMAL LOW (ref 36.0–46.0)
Hemoglobin: 7.5 g/dL — ABNORMAL LOW (ref 12.0–15.0)
Immature Granulocytes: 1 %
Lymphocytes Relative: 37 %
Lymphs Abs: 1.7 10*3/uL (ref 0.7–4.0)
MCH: 29.8 pg (ref 26.0–34.0)
MCHC: 33.8 g/dL (ref 30.0–36.0)
MCV: 88.1 fL (ref 80.0–100.0)
Monocytes Absolute: 0.3 10*3/uL (ref 0.1–1.0)
Monocytes Relative: 6 %
Neutro Abs: 2.4 10*3/uL (ref 1.7–7.7)
Neutrophils Relative %: 53 %
Platelets: 503 10*3/uL — ABNORMAL HIGH (ref 150–400)
RBC: 2.52 MIL/uL — ABNORMAL LOW (ref 3.87–5.11)
RDW: 14.3 % (ref 11.5–15.5)
WBC: 4.5 10*3/uL (ref 4.0–10.5)
nRBC: 0 % (ref 0.0–0.2)

## 2021-02-01 LAB — VITAMIN B12: Vitamin B-12: 389 pg/mL (ref 180–914)

## 2021-02-01 LAB — IRON AND TIBC
Iron: 35 ug/dL (ref 28–170)
Saturation Ratios: 13 % (ref 10.4–31.8)
TIBC: 261 ug/dL (ref 250–450)
UIBC: 226 ug/dL

## 2021-02-01 LAB — BASIC METABOLIC PANEL
Anion gap: 7 (ref 5–15)
BUN: 40 mg/dL — ABNORMAL HIGH (ref 6–20)
CO2: 24 mmol/L (ref 22–32)
Calcium: 8.4 mg/dL — ABNORMAL LOW (ref 8.9–10.3)
Chloride: 107 mmol/L (ref 98–111)
Creatinine, Ser: 2.03 mg/dL — ABNORMAL HIGH (ref 0.44–1.00)
GFR, Estimated: 33 mL/min — ABNORMAL LOW (ref >60)
Glucose, Bld: 71 mg/dL (ref 70–99)
Potassium: 3.4 mmol/L — ABNORMAL LOW (ref 3.5–5.1)
Sodium: 138 mmol/L (ref 135–145)

## 2021-02-01 LAB — GLUCOSE, CAPILLARY
Glucose-Capillary: 132 mg/dL — ABNORMAL HIGH (ref 70–99)
Glucose-Capillary: 141 mg/dL — ABNORMAL HIGH (ref 70–99)
Glucose-Capillary: 158 mg/dL — ABNORMAL HIGH (ref 70–99)
Glucose-Capillary: 59 mg/dL — ABNORMAL LOW (ref 70–99)
Glucose-Capillary: 72 mg/dL (ref 70–99)
Glucose-Capillary: 78 mg/dL (ref 70–99)

## 2021-02-01 LAB — FOLATE: Folate: 6.3 ng/mL (ref >5.9)

## 2021-02-01 LAB — FERRITIN: Ferritin: 121 ng/mL (ref 11–307)

## 2021-02-01 LAB — MAGNESIUM: Magnesium: 1.8 mg/dL (ref 1.7–2.4)

## 2021-02-01 MED ORDER — PANTOPRAZOLE SODIUM 40 MG PO TBEC
40.0000 mg | DELAYED_RELEASE_TABLET | Freq: Every day | ORAL | Status: DC
  Administered 2021-02-01 – 2021-02-02 (×2): 40 mg via ORAL
  Filled 2021-02-01 (×2): qty 1

## 2021-02-01 MED ORDER — POTASSIUM CHLORIDE CRYS ER 20 MEQ PO TBCR
40.0000 meq | EXTENDED_RELEASE_TABLET | Freq: Once | ORAL | Status: AC
  Administered 2021-02-01: 40 meq via ORAL
  Filled 2021-02-01: qty 2

## 2021-02-01 MED ORDER — INSULIN DETEMIR 100 UNIT/ML ~~LOC~~ SOLN
5.0000 [IU] | Freq: Every day | SUBCUTANEOUS | Status: DC
  Administered 2021-02-01 – 2021-02-02 (×2): 5 [IU] via SUBCUTANEOUS
  Filled 2021-02-01 (×2): qty 0.05

## 2021-02-01 MED ORDER — FUROSEMIDE 10 MG/ML IJ SOLN
40.0000 mg | Freq: Every day | INTRAMUSCULAR | Status: DC
  Administered 2021-02-01 – 2021-02-02 (×2): 40 mg via INTRAVENOUS
  Filled 2021-02-01 (×2): qty 4

## 2021-02-01 NOTE — Progress Notes (Signed)
Hypoglycemic Event  CBG: 49  Treatment: 8 oz juice/soda  Symptoms: Hungry and drowsy  Follow-up CBG: Time:2019 CBG Result:71  Possible Reasons for Event: Inadequate meal intake  Comments/MD notified:yes, Dr. Druscilla Brownie Rheannon Cerney

## 2021-02-01 NOTE — Plan of Care (Signed)
  Problem: Education: Goal: Knowledge of General Education information will improve Description: Including pain rating scale, medication(s)/side effects and non-pharmacologic comfort measures Outcome: Progressing   Problem: Nutrition: Goal: Adequate nutrition will be maintained Outcome: Progressing   Problem: Coping: Goal: Level of anxiety will decrease Outcome: Progressing   Problem: Elimination: Goal: Will not experience complications related to bowel motility Outcome: Progressing Goal: Will not experience complications related to urinary retention Outcome: Progressing   Problem: Pain Managment: Goal: General experience of comfort will improve Outcome: Progressing   Problem: Safety: Goal: Ability to remain free from injury will improve Outcome: Progressing   

## 2021-02-01 NOTE — Progress Notes (Signed)
Progress Note    Ann Robertson   TWK:[id number]  DOB: Oct 08, 1990  DOA: 01/26/2021     6  PCP: [facility] [place]  Initial CC: abd pain  Hospital Course: Ann Robertson is a 30 y.o. female with medical history significant for  T1DM W/ peripheral neuropathy, nephrotic syndrome, CKD stage IIIa, HTN, and anemia who presented with abdominal pain.  Patient reports that she awoke 8/29 AM feeling short of breath with severe epigastric abdominal pain. Pain has been chronic for years,has been told by her PCP that it was due to her anemia.Pain comes and goes and is sharp, crampy and dull. Pain worse after she urinates but no dysuria. Feels nauseous but no vomiting. No diarrhea. No fever. Reports she is on a regimen of 7 Units of Lantus daily with sliding scale. Takes 7 units "whenever she feels like it" during the day. Glucometer often reads > 500 and sometimes in the 200-400s.  Hx of 4 C-sections in the past. Reports tobacco use of 3-4 cigarettes per day, no alcohol or illicit drug use. Underwent work-up in the ER and was found to be in DKA and started on protocol. DKA resolved with treatment and she was transitioned to subcutaneous insulins.  Interval History:  No events overnight.  Did not respond well to Lasix yesterday.  Still having swelling notably more in her abdomen.  Endorses adequate bowel movements.  We discussed placing an IV and trying IV Lasix today. She continues to not follow an appropriate diet despite recommendations.  She is going to inevitably go home and eat what ever she wants regardless.  ROS: Constitutional: negative for chills and fevers, Respiratory: negative for cough, Cardiovascular: negative for chest pain, and Gastrointestinal: positive for abdominal pain, negative for constipation  Assessment & Plan: * DKA (diabetic ketoacidosis) (HCC)-resolved as of 01/28/2021 - Likely due to noncompliance with insulins at home.  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  Has been higher than this in the past.  Patient does not seem to have good understanding of the importance of maintaining consistent eating habits as well as insulin compliance - Continue basal insulin, adjusting as needed - patient requested regular diet ("eats what she wants at home"); have changed her diet to regular to better be able to dose her insulin as she is expected to not follow a diabetic diet at home unfortunately - resume prandial insulin and continue SSI - CBGs have elevated as expected as she has had her diet advanced and inevitably not following recommendations  Generalized edema - likely from volume resuscitation on admission with hypoalbuminemia and some third spacing - Lasix initiated today for trial; did not respond to oral Lasix.  IV being placed today and will start her on IV Lasix and monitor response - Check BMP in a.m.  Peripheral neuropathy - Continue gabapentin  HTN (hypertension) - s/p metoprolol (see below) - starting amlodipine due to uncontrolled pressures  - changed metoprolol to coreg on 9/2 - would benefit from lisinopril but awaiting renal function to further normalize; has had wosening proteinuria since April 2022  Acute renal failure superimposed on stage 3a chronic kidney disease ([place]) - patient has history of CKD3a. Baseline creat ~ 1.4 - 1.7, eGFR 47 - 51 - patient presents with increase in creat >0.3 mg/dL above baseline, creat increase >1.5x baseline presumed to have occurred within past 7 days  PTA - improved with IVF - at risk for progressive CKD due to uncontrolled DM  Anemia - stable Hgb  Abdominal pain, acute, epigastric - Chronic pain per patient.  Reports that she is on chronic opiates as well (percocet thru pain clinic) - Suspect there may be some underlying gastroparesis contributing given severely  uncontrolled diabetes at baseline  Hypertensive urgency-resolved as of 01/28/2021 - Patient initially refused hydralazine - now controlled on current regimen  Orthostatic hypotension-resolved as of 01/28/2021 - Resolved with fluids   Old records reviewed in assessment of this patient  Antimicrobials:   DVT prophylaxis: enoxaparin (LOVENOX) injection 40 mg Start: 01/27/21 2200   Code Status:   Code Status: Full Code Family Communication: significant other  Disposition Plan: Status is: Inpatient  Remains inpatient appropriate because:Inpatient level of care appropriate due to severity of illness  Dispo: The patient is from: Home              Anticipated d/c is to: Home              Patient currently is medically stable to d/c.   Difficult to place patient No  Risk of unplanned readmission score: Unplanned Admission- Pilot do not use: 20.61   Objective: Blood pressure (!) 131/93, pulse 99, temperature 98.1 F (36.7 C), temperature source Oral, resp. rate 16, height 5' 5"  (1.651 m), weight 56.2 kg, last menstrual period 12/02/2020, SpO2 99 %.  Examination: General appearance: alert, cooperative, and no distress Head: Normocephalic, without obvious abnormality, atraumatic Eyes:  EOMI Lungs: clear to auscultation bilaterally Heart: regular rate and rhythm and S1, S2 normal Abdomen: normal findings: bowel sounds normal and spleen non-palpable Extremities:  Generalized minimal pitting edema in extremities, abdomen, back Skin: mobility and turgor normal Neurologic: Grossly normal  Consultants:    Procedures:    Data Reviewed: I have personally reviewed following labs and imaging studies Results for orders placed or performed during the hospital encounter of 01/26/21 (from the past 24 hour(s))  Glucose, capillary     Status: None   Collection Time: 01/31/21  4:27 PM  Result Value Ref Range   Glucose-Capillary 89 70 - 99 mg/dL  Glucose, capillary     Status: Abnormal    Collection Time: 01/31/21  7:51 PM  Result Value Ref Range   Glucose-Capillary 49 (L) 70 - 99 mg/dL  Glucose, capillary     Status: None   Collection Time: 01/31/21  8:19 PM  Result Value Ref Range   Glucose-Capillary 71 70 - 99 mg/dL  Glucose, capillary     Status: None   Collection Time: 01/31/21 10:33 PM  Result Value Ref Range   Glucose-Capillary 97 70 - 99 mg/dL  Glucose, capillary     Status: Abnormal   Collection Time: 02/01/21  7:24 AM  Result Value Ref Range   Glucose-Capillary 158 (H) 70 - 99 mg/dL  Glucose, capillary     Status: Abnormal   Collection Time: 02/01/21 11:49 AM  Result Value Ref Range   Glucose-Capillary 141 (H) 70 - 99 mg/dL    Recent Results (from the past 240 hour(s))  Resp Panel by RT-PCR (Flu A&B, Covid) Nasopharyngeal Swab     Status: None   Collection Time: 01/26/21  5:03 PM   Specimen: Nasopharyngeal Swab; Nasopharyngeal(NP) swabs in vial transport medium  Result Value Ref Range Status   SARS Coronavirus 2 by RT PCR NEGATIVE NEGATIVE Final    Comment: (NOTE) SARS-CoV-2 target nucleic acids are NOT DETECTED.  The SARS-CoV-2  RNA is generally detectable in upper respiratory specimens during the acute phase of infection. The lowest concentration of SARS-CoV-2 viral copies this assay can detect is 138 copies/mL. A negative result does not preclude SARS-Cov-2 infection and should not be used as the sole basis for treatment or other patient management decisions. A negative result may occur with  improper specimen collection/handling, submission of specimen other than nasopharyngeal swab, presence of viral mutation(s) within the areas targeted by this assay, and inadequate number of viral copies(<138 copies/mL). A negative result must be combined with clinical observations, patient history, and epidemiological information. The expected result is Negative.  Fact Sheet for Patients:  EntrepreneurPulse.com.au  Fact Sheet for  Healthcare Providers:  IncredibleEmployment.be  This test is no t yet approved or cleared by the [place] FDA and  has been authorized for detection and/or diagnosis of SARS-CoV-2 by FDA under an Emergency Use Authorization (EUA). This EUA will remain  in effect (meaning this test can be used) for the duration of the COVID-19 declaration under Section 564(b)(1) of the Act, 21 U.S.C.section 360bbb-3(b)(1), unless the authorization is terminated  or revoked sooner.       Influenza A by PCR NEGATIVE NEGATIVE Final   Influenza B by PCR NEGATIVE NEGATIVE Final    Comment: (NOTE) The Xpert Xpress SARS-CoV-2/FLU/RSV plus assay is intended as an aid in the diagnosis of influenza from Nasopharyngeal swab specimens and should not be used as a sole basis for treatment. Nasal washings and aspirates are unacceptable for Xpert Xpress SARS-CoV-2/FLU/RSV testing.  Fact Sheet for Patients: EntrepreneurPulse.com.au  Fact Sheet for Healthcare Providers: IncredibleEmployment.be  This test is not yet approved or cleared by the [place] FDA and has been authorized for detection and/or diagnosis of SARS-CoV-2 by FDA under an Emergency Use Authorization (EUA). This EUA will remain in effect (meaning this test can be used) for the duration of the COVID-19 declaration under Section 564(b)(1) of the Act, 21 U.S.C. section 360bbb-3(b)(1), unless the authorization is terminated or revoked.  Performed at [facility], [place] [address], [place], [place] [address]   MRSA Next Gen by PCR, Nasal     Status: None   Collection Time: 01/26/21 11:01 PM   Specimen: Nasal Mucosa; Nasal Swab  Result Value Ref Range Status   MRSA by PCR Next Gen NOT DETECTED NOT DETECTED Final    Comment: (NOTE) The GeneXpert MRSA Assay (FDA approved for NASAL specimens only), is one component of a comprehensive MRSA colonization  surveillance program. It is not intended to diagnose MRSA infection nor to guide or monitor treatment for MRSA infections. Test performance is not FDA approved in patients less than 60 years old. Performed at [facility], [place] [address], [place], [place] [address]      Radiology Studies: DG Abd Portable 1V  Result Date: 01/31/2021 CLINICAL DATA:  Generalized abdominal pain EXAM: PORTABLE ABDOMEN - 1 VIEW COMPARISON:  None. FINDINGS: Moderate stool burden throughout the colon. There is a non obstructive bowel gas pattern. No supine evidence of free air. No organomegaly or suspicious calcification. No acute bony abnormality. Bilateral tubal ligation clips noted in the pelvis. IMPRESSION: Moderate stool burden.  No acute findings. Electronically Signed   By: Rolm Baptise M.D.   On: 01/31/2021 19:49   DG Abd Portable 1V  Final Result    DG Chest Portable 1 View  Final Result      Scheduled Meds:  amLODipine  10 mg Oral Daily   carvedilol  12.5 mg Oral BID WC   Chlorhexidine Gluconate Cloth  6 each Topical Daily   enoxaparin (LOVENOX) injection  40 mg Subcutaneous Q24H   furosemide  40 mg Intravenous Daily   gabapentin  600 mg Oral TID   insulin aspart  0-5 Units Subcutaneous QHS   insulin aspart  0-9 Units Subcutaneous TID WC   insulin aspart  4 Units Subcutaneous TID WC   insulin detemir  5 Units Subcutaneous Daily   oxyCODONE-acetaminophen  1 tablet Oral Q4H   And   oxyCODONE  5 mg Oral Q4H   pantoprazole  40 mg Oral Daily   PRN Meds: acetaminophen, alum & mag hydroxide-simeth, dextrose, hydrALAZINE, labetalol, ondansetron (ZOFRAN) IV Continuous Infusions:   LOS: 6 days  Time spent: Greater than 50% of the 35 minute visit was spent in counseling/coordination of care for the patient as laid out in the A&P.   Dwyane Dee, MD [facility] 02/01/2021, 1:21 PM

## 2021-02-01 NOTE — Progress Notes (Addendum)
Hypoglycemic Event  CBG: 59  Treatment:   Symptoms: sleepy  Follow-up CBG: Time: 0645 CBG Result:72  Possible Reasons for Event: uncontrolled/incompliant with diet  Comments/MD notified:    Charlyne Petrin

## 2021-02-02 LAB — CBC WITH DIFFERENTIAL/PLATELET
Abs Immature Granulocytes: 0.07 10*3/uL (ref 0.00–0.07)
Basophils Absolute: 0 10*3/uL (ref 0.0–0.1)
Basophils Relative: 0 %
Eosinophils Absolute: 0.1 10*3/uL (ref 0.0–0.5)
Eosinophils Relative: 3 %
HCT: 23.2 % — ABNORMAL LOW (ref 36.0–46.0)
Hemoglobin: 7.6 g/dL — ABNORMAL LOW (ref 12.0–15.0)
Immature Granulocytes: 2 %
Lymphocytes Relative: 34 %
Lymphs Abs: 1.6 10*3/uL (ref 0.7–4.0)
MCH: 29.5 pg (ref 26.0–34.0)
MCHC: 32.8 g/dL (ref 30.0–36.0)
MCV: 89.9 fL (ref 80.0–100.0)
Monocytes Absolute: 0.4 10*3/uL (ref 0.1–1.0)
Monocytes Relative: 8 %
Neutro Abs: 2.6 10*3/uL (ref 1.7–7.7)
Neutrophils Relative %: 53 %
Platelets: 499 10*3/uL — ABNORMAL HIGH (ref 150–400)
RBC: 2.58 MIL/uL — ABNORMAL LOW (ref 3.87–5.11)
RDW: 14.6 % (ref 11.5–15.5)
WBC: 4.8 10*3/uL (ref 4.0–10.5)
nRBC: 0 % (ref 0.0–0.2)

## 2021-02-02 LAB — GLUCOSE, CAPILLARY
Glucose-Capillary: 261 mg/dL — ABNORMAL HIGH (ref 70–99)
Glucose-Capillary: 218 mg/dL — ABNORMAL HIGH (ref 70–99)

## 2021-02-02 LAB — HEPATIC FUNCTION PANEL
ALT: 40 U/L (ref 0–44)
AST: 88 U/L — ABNORMAL HIGH (ref 15–41)
Albumin: 1.8 g/dL — ABNORMAL LOW (ref 3.5–5.0)
Alkaline Phosphatase: 95 U/L (ref 38–126)
Bilirubin, Direct: 0.1 mg/dL (ref 0.0–0.2)
Indirect Bilirubin: 0.3 mg/dL (ref 0.3–0.9)
Total Bilirubin: 0.4 mg/dL (ref 0.3–1.2)
Total Protein: 5 g/dL — ABNORMAL LOW (ref 6.5–8.1)

## 2021-02-02 LAB — BASIC METABOLIC PANEL
Anion gap: 2 — ABNORMAL LOW (ref 5–15)
BUN: 43 mg/dL — ABNORMAL HIGH (ref 6–20)
CO2: 27 mmol/L (ref 22–32)
Calcium: 8 mg/dL — ABNORMAL LOW (ref 8.9–10.3)
Chloride: 105 mmol/L (ref 98–111)
Creatinine, Ser: 2.2 mg/dL — ABNORMAL HIGH (ref 0.44–1.00)
GFR, Estimated: 30 mL/min — ABNORMAL LOW (ref >60)
Glucose, Bld: 204 mg/dL — ABNORMAL HIGH (ref 70–99)
Potassium: 5.3 mmol/L — ABNORMAL HIGH (ref 3.5–5.1)
Sodium: 134 mmol/L — ABNORMAL LOW (ref 135–145)

## 2021-02-02 LAB — MAGNESIUM: Magnesium: 1.9 mg/dL (ref 1.7–2.4)

## 2021-02-02 MED ORDER — CARVEDILOL 12.5 MG PO TABS: 12.5000 mg | ORAL_TABLET | Freq: Two times a day (BID) | ORAL | 3 refills | Status: DC

## 2021-02-02 MED ORDER — LANTUS SOLOSTAR 100 UNIT/ML ~~LOC~~ SOPN: 5.0000 [IU] | PEN_INJECTOR | Freq: Every day | SUBCUTANEOUS | 3 refills | Status: DC

## 2021-02-02 MED ORDER — PANTOPRAZOLE SODIUM 40 MG PO TBEC
40.0000 mg | DELAYED_RELEASE_TABLET | Freq: Every day | ORAL | 3 refills | Status: DC
Start: 2021-02-03 — End: 2021-04-12

## 2021-02-02 MED ORDER — OXYCODONE-ACETAMINOPHEN 5-325 MG PO TABS: 1.0000 | ORAL_TABLET | Freq: Four times a day (QID) | ORAL | 0 refills | Status: AC | PRN

## 2021-02-02 MED ORDER — AMLODIPINE BESYLATE 10 MG PO TABS: 10.0000 mg | ORAL_TABLET | Freq: Every day | ORAL | 3 refills | Status: DC

## 2021-02-02 NOTE — Discharge Instructions (Signed)
Reduce Lantus to 5 units daily. If your glucose levels stay consistently above 100 then okay to increase Lantus to 7 units daily. If they continue to elevate above 200 consistently then increase Lantus to 9 units daily and call your doctor to discuss it further.   Try drinking Glucerna shakes (2 or 3 daily) to increase your protein intake. You can also try avocados, raw nuts, or yogurt

## 2021-02-02 NOTE — Plan of Care (Signed)

## 2021-02-02 NOTE — Discharge Summary (Signed)
Physician Discharge Summary   Ann Robertson HTD:[id number] DOB: 1990-10-27 DOA: 01/26/2021  PCP: [facility]  Admit date: 01/26/2021 Discharge date:  02/02/2021  Admitted From: home Disposition:  home Discharging physician: Dwyane Dee, MD  Recommendations for Outpatient Follow-up:  Consider initiation of lisinopril if renal function stable Adjust insulin regimen and follow-up Home glucose readings  Home Health:  Equipment/Devices: Rolling walker  Patient discharged to home in Discharge Condition: stable Risk of unplanned readmission score: Unplanned Admission- Pilot do not use: 20.85  CODE STATUS: Full Diet recommendation:  Diet Orders (From admission, onward)     Start     Ordered   02/02/21 0000  Diet Carb Modified        02/02/21 1008   01/29/21 1109  Diet regular Room service appropriate? Yes; Fluid consistency: Thin  Diet effective now       Question Answer Comment  Room service appropriate? Yes   Fluid consistency: Thin      01/29/21 1108            Hospital Course: [name] is a 29 y.o. female with medical history significant for  T1DM W/ peripheral neuropathy, nephrotic syndrome, CKD stage IIIa, HTN, and anemia who presented with abdominal pain.  Patient reports that she awoke 8/29 AM feeling short of breath with severe epigastric abdominal pain. Pain has been chronic for years,has been told by her PCP that it was due to her anemia.Pain comes and goes and is sharp, crampy and dull. Pain worse after she urinates but no dysuria. Feels nauseous but no vomiting. No diarrhea. No fever. Reports she is on a regimen of 7 Units of Lantus daily with sliding scale. Takes 7 units "whenever she feels like it" during the day. Glucometer often reads > 500 and sometimes in the 200-400s.  Hx of 4 C-sections in the past. Reports tobacco use of 3-4 cigarettes per day, no alcohol or illicit drug use. Underwent work-up in the ER and was found to be in DKA and started  on protocol. DKA resolved with treatment and she was transitioned to subcutaneous insulins.  See below for further A&P.   * DKA (diabetic ketoacidosis) (HCC)-resolved as of 01/28/2021 - Likely due to noncompliance with insulins at home.  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  Has been higher than this in the past.  Patient does not seem to have good understanding of the importance of maintaining consistent eating habits as well as insulin compliance - Continue basal insulin, adjusting as needed - patient requested regular diet ("eats what she wants at home"); have changed her diet to regular to better be able to dose her insulin as she is expected to not follow a diabetic diet at home unfortunately - resume prandial insulin and continue SSI - CBGs have elevated as expected as she has had her diet advanced and inevitably not following recommendations -Glucose levels somewhat labile depending on her food intake.  She is discharged with Lantus 5 units daily and instructed to increase by 2 units daily slowly if glucose levels begin to consistently uptrend (instructions provided on AVS)  Generalized edema - likely from volume resuscitation on admission with hypoalbuminemia and some third spacing -Minimal response to Lasix - Patient recommended to increase protein intake at discharge to aid with her underlying hypoalbuminemia contributing to third spacing.  Recommendations were given for foods rich in  protein and diabetic friendly  Peripheral neuropathy - Continue gabapentin  HTN (hypertension) - s/p metoprolol (see below) - starting amlodipine due to uncontrolled pressures  - changed metoprolol to coreg on 9/2 - would benefit from lisinopril but awaiting renal function to further normalize; has had wosening proteinuria since April 2022  Acute renal  failure superimposed on stage 3a chronic kidney disease ([place]) - patient has history of CKD3a. Baseline creat ~ 1.4 - 1.7, eGFR 47 - 51 - patient presents with increase in creat >0.3 mg/dL above baseline, creat increase >1.5x baseline presumed to have occurred within past 7 days PTA - improved with IVF - at risk for progressive CKD due to uncontrolled DM  Anemia - stable Hgb  Abdominal pain, acute, epigastric - Chronic pain per patient.  Reports that she is on chronic opiates as well (percocet thru pain clinic) - Suspect there may be some underlying gastroparesis contributing given severely uncontrolled diabetes at baseline  Hypertensive urgency-resolved as of 01/28/2021 - Patient initially refused hydralazine - now controlled on current regimen  Orthostatic hypotension-resolved as of 01/28/2021 - Resolved with fluids    The patient's chronic medical conditions were treated accordingly per the patient's home medication regimen except as noted.  On day of discharge, patient was felt deemed stable for discharge. Patient/family member advised to call PCP or come back to ER if needed.   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[place]) 05/13/2014    Priority: High   Generalized edema 01/31/2021    Priority: Medium   Acute renal failure superimposed on stage 3a chronic kidney disease ([place]) 01/26/2021    Priority: Medium   HTN (hypertension) 01/26/2021    Priority: Medium   Peripheral neuropathy 01/26/2021    Priority: Medium   Anemia 01/26/2021   Abdominal pain, acute, epigastric 10/30/2020    Resolved Hospital Problems   Diagnosis Date Noted Date Resolved   DKA (diabetic ketoacidosis) ([place]) 01/26/2021 01/28/2021    Priority: High   Orthostatic hypotension 01/28/2021 01/28/2021   Hypertensive urgency 01/28/2021 01/28/2021    Discharge  Instructions     Diet Carb Modified   Complete by: As directed    Increase activity slowly   Complete by: As directed       Allergies as of 02/02/2021       Reactions   Hydromorphone Hcl Itching, Rash        Medication List     STOP taking these medications    acetaminophen 500 MG tablet Commonly known as: TYLENOL   oxyCODONE-acetaminophen 10-325 MG tablet Commonly known as: PERCOCET Replaced by: oxyCODONE-acetaminophen 5-325 MG tablet       TAKE these medications    amLODipine 10 MG tablet Commonly known as: NORVASC Take 1 tablet (10 mg total) by mouth daily. Start taking on: February 03, 2021   carvedilol 12.5 MG tablet Commonly known as: COREG Take 1 tablet (12.5 mg total) by mouth 2 (two) times daily with a meal.   FreeStyle Libre 2 Reader Devi 1 each by Does not apply route in the morning, at noon, in the evening, and at bedtime.   gabapentin 600 MG tablet Commonly known as: NEURONTIN Take 600 mg by mouth 3 (three) times daily.   insulin lispro 100 UNIT/ML KwikPen Commonly known as: HUMALOG Inject 2-12 Units into the skin in the morning, at noon, and at bedtime. Per sliding scale  Lantus SoloStar 100 UNIT/ML Solostar Pen Generic drug: insulin glargine Inject 5 Units into the skin daily. And pen needles 1/day What changed:  how much to take when to take this   metoprolol succinate 50 MG 24 hr tablet Commonly known as: TOPROL-XL Take 50 mg by mouth daily. Take with or immediately following a meal.   ondansetron 4 MG tablet Commonly known as: ZOFRAN Take 1 tablet (4 mg total) by mouth every 8 (eight) hours as needed for nausea or vomiting.   oxyCODONE-acetaminophen 5-325 MG tablet Commonly known as: PERCOCET/ROXICET Take 1 tablet by mouth every 6 (six) hours as needed for up to 3 days for severe pain. Replaces: oxyCODONE-acetaminophen 10-325 MG tablet   pantoprazole 40 MG tablet Commonly known as: PROTONIX Take 1 tablet (40 mg total) by  mouth daily. Start taking on: February 03, 2021   Pen Needles 30G X 8 MM Misc 1 each by Does not apply route daily. E11.9   Vitamin D (Ergocalciferol) 1.25 MG (50000 UNIT) Caps capsule Commonly known as: DRISDOL Take 50,000 Units by mouth every Wednesday.               Durable Medical Equipment  (From admission, onward)           Start     Ordered   02/02/21 1005  For home use only DME Walker rolling  Once       Question Answer Comment  Walker: With 5 Inch Wheels   Patient needs a walker to treat with the following condition Generalized muscle weakness      02/02/21 [place]. Schedule an appointment as soon as possible for a visit in 1 week(s).   Contact information: [address] [place] [address] [phone]                Allergies  Allergen Reactions   Hydromorphone Hcl Itching and Rash    Consultations:   Discharge Exam: BP (!) 143/94 (BP Location: Right Arm)   Pulse (!) 109   Temp 98.4 F (36.9 C) (Oral)   Resp 16   Ht _0  (1.651 m)   Wt 56.2 kg   LMP 12/02/2020 (Approximate)   SpO2 99%   BMI 20.63 kg/m  General appearance: alert, cooperative, and no distress Head: Normocephalic, without obvious abnormality, atraumatic Eyes:  EOMI Lungs: clear to auscultation bilaterally Heart: regular rate and rhythm and S1, S2 normal Abdomen: normal findings: bowel sounds normal and spleen non-palpable Extremities:  Generalized minimal pitting edema in extremities, abdomen, back Skin: mobility and turgor normal Neurologic: Grossly normal  The results of significant diagnostics from this hospitalization (including imaging, microbiology, ancillary and laboratory) are listed below for reference.   Microbiology: Recent Results (from the past 240 hour(s))  Resp Panel by RT-PCR (Flu A&B, Covid) Nasopharyngeal Swab     Status: None   Collection Time: 01/26/21  5:03 PM    Specimen: Nasopharyngeal Swab; Nasopharyngeal(NP) swabs in vial transport medium  Result Value Ref Range Status   SARS Coronavirus 2 by RT PCR NEGATIVE NEGATIVE Final    Comment: (NOTE) SARS-CoV-2 target nucleic acids are NOT DETECTED.  The SARS-CoV-2 RNA is generally detectable in upper respiratory specimens during the acute phase of infection. The lowest concentration of SARS-CoV-2 viral copies this assay can detect is 138 copies/mL. A negative result does not preclude SARS-Cov-2 infection and should not be used  as the sole basis for treatment or other patient management decisions. A negative result may occur with  improper specimen collection/handling, submission of specimen other than nasopharyngeal swab, presence of viral mutation(s) within the areas targeted by this assay, and inadequate number of viral copies(<138 copies/mL). A negative result must be combined with clinical observations, patient history, and epidemiological information. The expected result is Negative.  Fact Sheet for Patients:  EntrepreneurPulse.com.au  Fact Sheet for Healthcare Providers:  IncredibleEmployment.be  This test is no t yet approved or cleared by the [place] FDA and  has been authorized for detection and/or diagnosis of SARS-CoV-2 by FDA under an Emergency Use Authorization (EUA). This EUA will remain  in effect (meaning this test can be used) for the duration of the COVID-19 declaration under Section 564(b)(1) of the Act, 21 U.S.C.section 360bbb-3(b)(1), unless the authorization is terminated  or revoked sooner.       Influenza A by PCR NEGATIVE NEGATIVE Final   Influenza B by PCR NEGATIVE NEGATIVE Final    Comment: (NOTE) The Xpert Xpress SARS-CoV-2/FLU/RSV plus assay is intended as an aid in the diagnosis of influenza from Nasopharyngeal swab specimens and should not be used as a sole basis for treatment. Nasal washings and aspirates are  unacceptable for Xpert Xpress SARS-CoV-2/FLU/RSV testing.  Fact Sheet for Patients: EntrepreneurPulse.com.au  Fact Sheet for Healthcare Providers: IncredibleEmployment.be  This test is not yet approved or cleared by the [place] FDA and has been authorized for detection and/or diagnosis of SARS-CoV-2 by FDA under an Emergency Use Authorization (EUA). This EUA will remain in effect (meaning this test can be used) for the duration of the COVID-19 declaration under Section 564(b)(1) of the Act, 21 U.S.C. section 360bbb-3(b)(1), unless the authorization is terminated or revoked.  Performed at [facility], [place] [address], [place], [place] [address]   MRSA Next Gen by PCR, Nasal     Status: None   Collection Time: 01/26/21 11:01 PM   Specimen: Nasal Mucosa; Nasal Swab  Result Value Ref Range Status   MRSA by PCR Next Gen NOT DETECTED NOT DETECTED Final    Comment: (NOTE) The GeneXpert MRSA Assay (FDA approved for NASAL specimens only), is one component of a comprehensive MRSA colonization surveillance program. It is not intended to diagnose MRSA infection nor to guide or monitor treatment for MRSA infections. Test performance is not FDA approved in patients less than 82 years old. Performed at [facility], [place] [address] [place], [place] [address]      Labs: BNP (last 3 results) No results for input(s): BNP in the last 8760 hours. Basic Metabolic Panel: Recent Labs  Lab 01/26/21 1531 01/26/21 2210 01/27/21 0742 01/28/21 0310 01/28/21 0809 01/30/21 0843 01/31/21 0800 02/01/21 1520 02/02/21 0402  NA 129*   < > 137 131*  --   --  134* 138 134*  K 4.0   < > 3.7 3.7  --   --  4.0 3.4* 5.3*  CL 95*   < > 106 102  --   --  105 107 105  CO2 11*   < > 27 22  --   --  _0 GLUCOSE 774*   < > 139* 278* 443* 475* 237* 71 204*  BUN 41*   < > 30* 27*  --   --  38* 40* 43*  CREATININE 2.35*   < >  1.73* 2.11*  --   --  2.09* 2.03* 2.20*  CALCIUM 8.2*   < > 7.9* 7.5*  --   --  8.2* 8.4* 8.0*  MG 1.9  --   --   --   --   --  1.7 1.8 1.9   < > = values in this interval not displayed.   Liver Function Tests: Recent Labs  Lab 01/26/21 1531 02/02/21 0402  AST 17 88*  ALT 15 40  ALKPHOS 87 95  BILITOT 1.6* 0.4  PROT 5.8* 5.0*  ALBUMIN 2.2* 1.8*   Recent Labs  Lab 01/26/21 1531  LIPASE 25   No results for input(s): AMMONIA in the last 168 hours. CBC: Recent Labs  Lab 01/26/21 1531 01/31/21 0800 02/01/21 1520 02/02/21 0402  WBC 6.0 4.3 4.5 4.8  NEUTROABS 4.1 2.4 2.4 2.6  HGB 9.5* 8.5* 7.5* 7.6*  HCT 29.7* 25.1* 22.2* 23.2*  MCV 91.7 87.5 88.1 89.9  PLT 331 413* 503* 499*   Cardiac Enzymes: No results for input(s): CKTOTAL, CKMB, CKMBINDEX, TROPONINI in the last 168 hours. BNP: Invalid input(s): POCBNP CBG: Recent Labs  Lab 02/01/21 1647 02/01/21 2015 02/01/21 2346 02/02/21 0744 02/02/21 1117  GLUCAP 72 78 132* 261* 218*   D-Dimer No results for input(s): DDIMER in the last 72 hours. Hgb A1c No results for input(s): HGBA1C in the last 72 hours. Lipid Profile No results for input(s): CHOL, HDL, LDLCALC, TRIG, CHOLHDL, LDLDIRECT in the last 72 hours. Thyroid function studies No results for input(s): TSH, T4TOTAL, T3FREE, THYROIDAB in the last 72 hours.  Invalid input(s): FREET3 Anemia work up Recent Labs    02/01/21 1519  VITAMINB12 389  FOLATE 6.3  FERRITIN 121  TIBC 261  IRON 35   Urinalysis    Component Value Date/Time   COLORURINE STRAW (A) 01/26/2021 1707   APPEARANCEUR CLEAR 01/26/2021 1707   LABSPEC 1.017 01/26/2021 1707   PHURINE 5.0 01/26/2021 1707   GLUCOSEU >=500 (A) 01/26/2021 1707   HGBUR MODERATE (A) 01/26/2021 1707   BILIRUBINUR NEGATIVE 01/26/2021 1707   KETONESUR 80 (A) 01/26/2021 1707   PROTEINUR >=300 (A) 01/26/2021 1707   UROBILINOGEN 0.2 11/06/2015 1011   NITRITE NEGATIVE 01/26/2021 1707   LEUKOCYTESUR NEGATIVE  01/26/2021 1707   Sepsis Labs Invalid input(s): PROCALCITONIN,  WBC,  LACTICIDVEN Microbiology Recent Results (from the past 240 hour(s))  Resp Panel by RT-PCR (Flu A&B, Covid) Nasopharyngeal Swab     Status: None   Collection Time: 01/26/21  5:03 PM   Specimen: Nasopharyngeal Swab; Nasopharyngeal(NP) swabs in vial transport medium  Result Value Ref Range Status   SARS Coronavirus 2 by RT PCR NEGATIVE NEGATIVE Final    Comment: (NOTE) SARS-CoV-2 target nucleic acids are NOT DETECTED.  The SARS-CoV-2 RNA is generally detectable in upper respiratory specimens during the acute phase of infection. The lowest concentration of SARS-CoV-2 viral copies this assay can detect is 138 copies/mL. A negative result does not preclude SARS-Cov-2 infection and should not be used as the sole basis for treatment or other patient management decisions. A negative result may occur with  improper specimen collection/handling, submission of specimen other than nasopharyngeal swab, presence of viral mutation(s) within the areas targeted by this assay, and inadequate number of viral copies(<138 copies/mL). A negative result must be combined with clinical observations, patient history, and epidemiological information. The expected result is Negative.  Fact Sheet for Patients:  EntrepreneurPulse.com.au  Fact Sheet for Healthcare Providers:  IncredibleEmployment.be  This test is no t yet approved or cleared by the [place] FDA and  has  been authorized for detection and/or diagnosis of SARS-CoV-2 by FDA under an Emergency Use Authorization (EUA). This EUA will remain  in effect (meaning this test can be used) for the duration of the COVID-19 declaration under Section 564(b)(1) of the Act, 21 U.S.C.section 360bbb-3(b)(1), unless the authorization is terminated  or revoked sooner.       Influenza A by PCR NEGATIVE NEGATIVE Final   Influenza B by PCR NEGATIVE  NEGATIVE Final    Comment: (NOTE) The Xpert Xpress SARS-CoV-2/FLU/RSV plus assay is intended as an aid in the diagnosis of influenza from Nasopharyngeal swab specimens and should not be used as a sole basis for treatment. Nasal washings and aspirates are unacceptable for Xpert Xpress SARS-CoV-2/FLU/RSV testing.  Fact Sheet for Patients: EntrepreneurPulse.com.au  Fact Sheet for Healthcare Providers: IncredibleEmployment.be  This test is not yet approved or cleared by the [place] FDA and has been authorized for detection and/or diagnosis of SARS-CoV-2 by FDA under an Emergency Use Authorization (EUA). This EUA will remain in effect (meaning this test can be used) for the duration of the COVID-19 declaration under Section 564(b)(1) of the Act, 21 U.S.C. section 360bbb-3(b)(1), unless the authorization is terminated or revoked.  Performed at [facility], [place] [address], [place], [place] [address]   MRSA Next Gen by PCR, Nasal     Status: None   Collection Time: 01/26/21 11:01 PM   Specimen: Nasal Mucosa; Nasal Swab  Result Value Ref Range Status   MRSA by PCR Next Gen NOT DETECTED NOT DETECTED Final    Comment: (NOTE) The GeneXpert MRSA Assay (FDA approved for NASAL specimens only), is one component of a comprehensive MRSA colonization surveillance program. It is not intended to diagnose MRSA infection nor to guide or monitor treatment for MRSA infections. Test performance is not FDA approved in patients less than 41 years old. Performed at [facility], [place] [address], [place], [place] [address]     Procedures/Studies: DG Chest Portable 1 View  Result Date: 01/26/2021 CLINICAL DATA:  Nausea, weakness, mid chest pain EXAM: PORTABLE CHEST 1 VIEW COMPARISON:  Chest radiograph 05/19/2013 FINDINGS: The cardiomediastinal silhouette is normal. There is slight asymmetric elevation of the right  hemidiaphragm, similar to 2014. There is no focal consolidation or pulmonary edema. There is no pleural effusion or pneumothorax. There is no acute osseous abnormality. IMPRESSION: No radiographic evidence of acute cardiopulmonary process. Electronically Signed   By: Valetta Mole M.D.   On: 01/26/2021 16:15   DG Abd Portable 1V  Result Date: 01/31/2021 CLINICAL DATA:  Generalized abdominal pain EXAM: PORTABLE ABDOMEN - 1 VIEW COMPARISON:  None. FINDINGS: Moderate stool burden throughout the colon. There is a non obstructive bowel gas pattern. No supine evidence of free air. No organomegaly or suspicious calcification. No acute bony abnormality. Bilateral tubal ligation clips noted in the pelvis. IMPRESSION: Moderate stool burden.  No acute findings. Electronically Signed   By: Rolm Baptise M.D.   On: 01/31/2021 19:49     Time coordinating discharge: Over 30 minutes    Dwyane Dee, MD  [facility] 02/02/2021, 11:57 AM

## 2021-02-02 NOTE — TOC Transition Note (Signed)
Transition of Care [facility]) - CM/SW Discharge Note  Patient Details  Name: Ann Robertson MRN: [id number] Date of Birth: 1990-11-21  [facility] of Care [facility]) CM/SW Contact:  Sherie Don, LCSW Phone Number: 02/02/2021, 10:14 AM  Clinical Narrative: Patient will discharge home today and will need a rolling walker. Patient is agreeable to a walker being delivered to her room. CSW made DME referral to [facility] with [employer]. Patient aware [employer] will deliver the walker to patient's room. TOC signing off.  Final next level of care: Home/Self Care Barriers to Discharge: Barriers Resolved  Patient Goals and CMS Choice CMS Medicare.gov Compare Post Acute Care list provided to:: Patient Choice offered to / list presented to : Patient  Discharge Plan and Services Discharge Planning Services: CM Consult       DME Arranged: Gilford Rile rolling DME Agency: [employer] Date DME Agency Contacted: 02/02/21 Time DME Agency Contacted: 1007 Representative spoke with at DME Agency: Brad  Readmission Risk Interventions No flowsheet data found.

## 2021-02-03 ENCOUNTER — Encounter: Payer: Medicaid Other | Attending: Family Medicine | Admitting: Nutrition

## 2021-02-03 ENCOUNTER — Encounter: Payer: Medicaid Other | Admitting: Nutrition

## 2021-02-11 ENCOUNTER — Inpatient Hospital Stay: Payer: Medicaid Other | Attending: Hematology and Oncology | Admitting: Hematology and Oncology

## 2021-02-11 ENCOUNTER — Inpatient Hospital Stay: Payer: Medicaid Other

## 2021-02-14 ENCOUNTER — Inpatient Hospital Stay (HOSPITAL_COMMUNITY)
Admission: EM | Admit: 2021-02-14 | Discharge: 2021-02-21 | DRG: 392 | Disposition: A | Discharge status: Home / Self Care | Payer: Medicaid Other | Attending: Internal Medicine | Admitting: Internal Medicine

## 2021-02-14 ENCOUNTER — Emergency Department (HOSPITAL_COMMUNITY): Payer: Medicaid Other

## 2021-02-14 ENCOUNTER — Encounter (HOSPITAL_COMMUNITY): Payer: Self-pay | Admitting: Emergency Medicine

## 2021-02-14 ENCOUNTER — Other Ambulatory Visit: Payer: Self-pay

## 2021-02-14 DIAGNOSIS — Z87441 Personal history of nephrotic syndrome: Secondary | ICD-10-CM

## 2021-02-14 DIAGNOSIS — R601 Generalized edema: Secondary | ICD-10-CM | POA: Diagnosis present

## 2021-02-14 DIAGNOSIS — N1832 Chronic kidney disease, stage 3b: Secondary | ICD-10-CM | POA: Diagnosis present

## 2021-02-14 DIAGNOSIS — E1121 Type 2 diabetes mellitus with diabetic nephropathy: Secondary | ICD-10-CM | POA: Diagnosis present

## 2021-02-14 DIAGNOSIS — Z83438 Family history of other disorder of lipoprotein metabolism and other lipidemia: Secondary | ICD-10-CM

## 2021-02-14 DIAGNOSIS — E1042 Type 1 diabetes mellitus with diabetic polyneuropathy: Secondary | ICD-10-CM | POA: Diagnosis present

## 2021-02-14 DIAGNOSIS — E1065 Type 1 diabetes mellitus with hyperglycemia: Secondary | ICD-10-CM | POA: Diagnosis present

## 2021-02-14 DIAGNOSIS — R000 Tachycardia, unspecified: Secondary | ICD-10-CM | POA: Diagnosis present

## 2021-02-14 DIAGNOSIS — F17210 Nicotine dependence, cigarettes, uncomplicated: Secondary | ICD-10-CM | POA: Diagnosis present

## 2021-02-14 DIAGNOSIS — Z885 Allergy status to narcotic agent status: Secondary | ICD-10-CM

## 2021-02-14 DIAGNOSIS — K5900 Constipation, unspecified: Principal | ICD-10-CM | POA: Diagnosis present

## 2021-02-14 DIAGNOSIS — E8809 Other disorders of plasma-protein metabolism, not elsewhere classified: Secondary | ICD-10-CM | POA: Diagnosis present

## 2021-02-14 DIAGNOSIS — E7800 Pure hypercholesterolemia, unspecified: Secondary | ICD-10-CM | POA: Diagnosis present

## 2021-02-14 DIAGNOSIS — K6289 Other specified diseases of anus and rectum: Secondary | ICD-10-CM | POA: Diagnosis present

## 2021-02-14 DIAGNOSIS — I129 Hypertensive chronic kidney disease with stage 1 through stage 4 chronic kidney disease, or unspecified chronic kidney disease: Secondary | ICD-10-CM | POA: Diagnosis present

## 2021-02-14 DIAGNOSIS — J90 Pleural effusion, not elsewhere classified: Secondary | ICD-10-CM | POA: Diagnosis present

## 2021-02-14 DIAGNOSIS — I160 Hypertensive urgency: Secondary | ICD-10-CM | POA: Diagnosis present

## 2021-02-14 DIAGNOSIS — E876 Hypokalemia: Secondary | ICD-10-CM | POA: Diagnosis present

## 2021-02-14 DIAGNOSIS — E1022 Type 1 diabetes mellitus with diabetic chronic kidney disease: Secondary | ICD-10-CM | POA: Diagnosis present

## 2021-02-14 DIAGNOSIS — Z79899 Other long term (current) drug therapy: Secondary | ICD-10-CM

## 2021-02-14 DIAGNOSIS — E871 Hypo-osmolality and hyponatremia: Secondary | ICD-10-CM | POA: Diagnosis present

## 2021-02-14 DIAGNOSIS — E8770 Fluid overload, unspecified: Secondary | ICD-10-CM | POA: Diagnosis present

## 2021-02-14 DIAGNOSIS — Z8249 Family history of ischemic heart disease and other diseases of the circulatory system: Secondary | ICD-10-CM

## 2021-02-14 DIAGNOSIS — R188 Other ascites: Secondary | ICD-10-CM | POA: Diagnosis present

## 2021-02-14 DIAGNOSIS — Z823 Family history of stroke: Secondary | ICD-10-CM

## 2021-02-14 DIAGNOSIS — T464X5A Adverse effect of angiotensin-converting-enzyme inhibitors, initial encounter: Secondary | ICD-10-CM | POA: Diagnosis present

## 2021-02-14 DIAGNOSIS — R109 Unspecified abdominal pain: Secondary | ICD-10-CM | POA: Diagnosis present

## 2021-02-14 DIAGNOSIS — E875 Hyperkalemia: Secondary | ICD-10-CM | POA: Diagnosis present

## 2021-02-14 DIAGNOSIS — Z20822 Contact with and (suspected) exposure to covid-19: Secondary | ICD-10-CM | POA: Diagnosis present

## 2021-02-14 DIAGNOSIS — B9689 Other specified bacterial agents as the cause of diseases classified elsewhere: Secondary | ICD-10-CM | POA: Diagnosis present

## 2021-02-14 DIAGNOSIS — E872 Acidosis: Secondary | ICD-10-CM | POA: Diagnosis present

## 2021-02-14 DIAGNOSIS — E1040 Type 1 diabetes mellitus with diabetic neuropathy, unspecified: Secondary | ICD-10-CM

## 2021-02-14 DIAGNOSIS — D631 Anemia in chronic kidney disease: Secondary | ICD-10-CM | POA: Diagnosis present

## 2021-02-14 DIAGNOSIS — N179 Acute kidney failure, unspecified: Secondary | ICD-10-CM

## 2021-02-14 DIAGNOSIS — D72829 Elevated white blood cell count, unspecified: Secondary | ICD-10-CM | POA: Diagnosis present

## 2021-02-14 DIAGNOSIS — I10 Essential (primary) hypertension: Secondary | ICD-10-CM | POA: Diagnosis present

## 2021-02-14 DIAGNOSIS — E1021 Type 1 diabetes mellitus with diabetic nephropathy: Secondary | ICD-10-CM | POA: Diagnosis present

## 2021-02-14 DIAGNOSIS — D649 Anemia, unspecified: Secondary | ICD-10-CM | POA: Diagnosis present

## 2021-02-14 DIAGNOSIS — Z833 Family history of diabetes mellitus: Secondary | ICD-10-CM

## 2021-02-14 DIAGNOSIS — G8929 Other chronic pain: Secondary | ICD-10-CM | POA: Diagnosis present

## 2021-02-14 DIAGNOSIS — Z841 Family history of disorders of kidney and ureter: Secondary | ICD-10-CM

## 2021-02-14 DIAGNOSIS — Z794 Long term (current) use of insulin: Secondary | ICD-10-CM

## 2021-02-14 LAB — CBC WITH DIFFERENTIAL/PLATELET
Abs Immature Granulocytes: 0.06 10*3/uL (ref 0.00–0.07)
Basophils Absolute: 0 10*3/uL (ref 0.0–0.1)
Basophils Relative: 0 %
Eosinophils Absolute: 0 10*3/uL (ref 0.0–0.5)
Eosinophils Relative: 0 %
HCT: 23.1 % — ABNORMAL LOW (ref 36.0–46.0)
Hemoglobin: 7.4 g/dL — ABNORMAL LOW (ref 12.0–15.0)
Immature Granulocytes: 1 %
Lymphocytes Relative: 9 %
Lymphs Abs: 1.1 10*3/uL (ref 0.7–4.0)
MCH: 29 pg (ref 26.0–34.0)
MCHC: 32 g/dL (ref 30.0–36.0)
MCV: 90.6 fL (ref 80.0–100.0)
Monocytes Absolute: 0.5 10*3/uL (ref 0.1–1.0)
Monocytes Relative: 4 %
Neutro Abs: 10.8 10*3/uL — ABNORMAL HIGH (ref 1.7–7.7)
Neutrophils Relative %: 86 %
Platelets: 338 10*3/uL (ref 150–400)
RBC: 2.55 MIL/uL — ABNORMAL LOW (ref 3.87–5.11)
RDW: 13.8 % (ref 11.5–15.5)
WBC: 12.5 10*3/uL — ABNORMAL HIGH (ref 4.0–10.5)
nRBC: 0 % (ref 0.0–0.2)

## 2021-02-14 LAB — COMPREHENSIVE METABOLIC PANEL
ALT: 24 U/L (ref 0–44)
AST: 20 U/L (ref 15–41)
Albumin: 1.8 g/dL — ABNORMAL LOW (ref 3.5–5.0)
Alkaline Phosphatase: 122 U/L (ref 38–126)
Anion gap: 9 (ref 5–15)
BUN: 43 mg/dL — ABNORMAL HIGH (ref 6–20)
CO2: 23 mmol/L (ref 22–32)
Calcium: 8.5 mg/dL — ABNORMAL LOW (ref 8.9–10.3)
Chloride: 104 mmol/L (ref 98–111)
Creatinine, Ser: 1.98 mg/dL — ABNORMAL HIGH (ref 0.44–1.00)
GFR, Estimated: 34 mL/min — ABNORMAL LOW (ref >60)
Glucose, Bld: 405 mg/dL — ABNORMAL HIGH (ref 70–99)
Potassium: 5.2 mmol/L — ABNORMAL HIGH (ref 3.5–5.1)
Sodium: 136 mmol/L (ref 135–145)
Total Bilirubin: 1.2 mg/dL (ref 0.3–1.2)
Total Protein: 5.7 g/dL — ABNORMAL LOW (ref 6.5–8.1)

## 2021-02-14 LAB — CBG MONITORING, ED: Glucose-Capillary: 349 mg/dL — ABNORMAL HIGH (ref 70–99)

## 2021-02-14 LAB — I-STAT BETA HCG BLOOD, ED (MC, WL, AP ONLY): I-stat hCG, quantitative: 5.7 m[IU]/mL — ABNORMAL HIGH (ref <5)

## 2021-02-14 LAB — LIPASE, BLOOD: Lipase: 19 U/L (ref 11–51)

## 2021-02-14 MED ORDER — MORPHINE SULFATE (PF) 4 MG/ML IV SOLN
4.0000 mg | Freq: Once | INTRAVENOUS | Status: AC
Start: 2021-02-14 — End: 2021-02-14
  Administered 2021-02-14: 4 mg via INTRAVENOUS
  Filled 2021-02-14: qty 1

## 2021-02-14 NOTE — ED Triage Notes (Signed)
Per EMS, patient from home, constipation x1 week. Seen by PCP for same. C/o continued abdominal pain and constipation. Reports trying both enemas and laxatives at home without relief. CBG 409.

## 2021-02-14 NOTE — ED Provider Notes (Signed)
Emergency Medicine Provider Triage Evaluation Note  Ann Robertson , a 30 y.o. female  was evaluated in triage.  Pt complains of constipation.  Review of Systems  Positive: Constipation, mild nausea, rectal discomfort, abd pain Negative: Fever, dysuria  Physical Exam  BP 133/85 (BP Location: Right Arm)   Pulse (!) 109   Temp 98.9 F (37.2 C) (Oral)   Resp 18   SpO2 100%  Gen:   Awake, uncomfortable Resp:  Normal effort  MSK:   Moves extremities without difficulty  Other:  Skin is pale, abdomen diffusely tender  Medical Decision Making  Medically screening exam initiated at 7:18 PM.  Appropriate orders placed.  Ann Robertson was informed that the remainder of the evaluation will be completed by another provider, this initial triage assessment does not replace that evaluation, and the importance of remaining in the ED until their evaluation is complete.  Hx of DM1, having been having persistent constipation x 1 week.  Has tried manual disimpaction and enema at home without relief.  Admits to taking opiate medication daily for abd pain x1 month.  Medication prescribed by PCP.    Domenic Moras, PA-C 02/14/21 1919    Luna Fuse, MD 02/14/21 2200

## 2021-02-15 ENCOUNTER — Encounter (HOSPITAL_COMMUNITY): Payer: Self-pay | Admitting: Internal Medicine

## 2021-02-15 DIAGNOSIS — E1021 Type 1 diabetes mellitus with diabetic nephropathy: Secondary | ICD-10-CM | POA: Diagnosis not present

## 2021-02-15 DIAGNOSIS — R000 Tachycardia, unspecified: Secondary | ICD-10-CM | POA: Diagnosis present

## 2021-02-15 DIAGNOSIS — E1121 Type 2 diabetes mellitus with diabetic nephropathy: Secondary | ICD-10-CM | POA: Diagnosis present

## 2021-02-15 DIAGNOSIS — E8809 Other disorders of plasma-protein metabolism, not elsewhere classified: Secondary | ICD-10-CM | POA: Diagnosis present

## 2021-02-15 DIAGNOSIS — E875 Hyperkalemia: Secondary | ICD-10-CM | POA: Diagnosis present

## 2021-02-15 DIAGNOSIS — B9689 Other specified bacterial agents as the cause of diseases classified elsewhere: Secondary | ICD-10-CM | POA: Diagnosis present

## 2021-02-15 DIAGNOSIS — R109 Unspecified abdominal pain: Secondary | ICD-10-CM

## 2021-02-15 DIAGNOSIS — N760 Acute vaginitis: Secondary | ICD-10-CM | POA: Diagnosis present

## 2021-02-15 DIAGNOSIS — K5900 Constipation, unspecified: Secondary | ICD-10-CM | POA: Diagnosis present

## 2021-02-15 DIAGNOSIS — R601 Generalized edema: Secondary | ICD-10-CM | POA: Diagnosis not present

## 2021-02-15 DIAGNOSIS — E876 Hypokalemia: Secondary | ICD-10-CM | POA: Diagnosis present

## 2021-02-15 DIAGNOSIS — D649 Anemia, unspecified: Secondary | ICD-10-CM

## 2021-02-15 LAB — WET PREP, GENITAL
Sperm: NONE SEEN
Trich, Wet Prep: NONE SEEN
Yeast Wet Prep HPF POC: NONE SEEN

## 2021-02-15 LAB — CBC
HCT: 22.4 % — ABNORMAL LOW (ref 36.0–46.0)
Hemoglobin: 7.3 g/dL — ABNORMAL LOW (ref 12.0–15.0)
MCH: 29.6 pg (ref 26.0–34.0)
MCHC: 32.6 g/dL (ref 30.0–36.0)
MCV: 90.7 fL (ref 80.0–100.0)
Platelets: 331 10*3/uL (ref 150–400)
RBC: 2.47 MIL/uL — ABNORMAL LOW (ref 3.87–5.11)
RDW: 13.4 % (ref 11.5–15.5)
WBC: 11.6 10*3/uL — ABNORMAL HIGH (ref 4.0–10.5)
nRBC: 0 % (ref 0.0–0.2)

## 2021-02-15 LAB — CBG MONITORING, ED
Glucose-Capillary: 110 mg/dL — ABNORMAL HIGH (ref 70–99)
Glucose-Capillary: 167 mg/dL — ABNORMAL HIGH (ref 70–99)
Glucose-Capillary: 363 mg/dL — ABNORMAL HIGH (ref 70–99)
Glucose-Capillary: 406 mg/dL — ABNORMAL HIGH (ref 70–99)
Glucose-Capillary: 461 mg/dL — ABNORMAL HIGH (ref 70–99)

## 2021-02-15 LAB — BASIC METABOLIC PANEL
Anion gap: 14 (ref 5–15)
BUN: 49 mg/dL — ABNORMAL HIGH (ref 6–20)
CO2: 20 mmol/L — ABNORMAL LOW (ref 22–32)
Calcium: 8.2 mg/dL — ABNORMAL LOW (ref 8.9–10.3)
Chloride: 100 mmol/L (ref 98–111)
Creatinine, Ser: 2.1 mg/dL — ABNORMAL HIGH (ref 0.44–1.00)
GFR, Estimated: 32 mL/min — ABNORMAL LOW (ref >60)
Glucose, Bld: 511 mg/dL (ref 70–99)
Potassium: 5 mmol/L (ref 3.5–5.1)
Sodium: 134 mmol/L — ABNORMAL LOW (ref 135–145)

## 2021-02-15 LAB — URINALYSIS, ROUTINE W REFLEX MICROSCOPIC
Bilirubin Urine: NEGATIVE
Glucose, UA: >=500 mg/dL — AB
Ketones, ur: 20 mg/dL — AB
Leukocytes,Ua: NEGATIVE
Nitrite: NEGATIVE
Protein, ur: >=300 mg/dL — AB
Specific Gravity, Urine: 1.02 (ref 1.005–1.030)
pH: 6 (ref 5.0–8.0)

## 2021-02-15 LAB — GLUCOSE, CAPILLARY
Glucose-Capillary: 105 mg/dL — ABNORMAL HIGH (ref 70–99)
Glucose-Capillary: 34 mg/dL — CL (ref 70–99)
Glucose-Capillary: 53 mg/dL — ABNORMAL LOW (ref 70–99)
Glucose-Capillary: 55 mg/dL — ABNORMAL LOW (ref 70–99)
Glucose-Capillary: 58 mg/dL — ABNORMAL LOW (ref 70–99)
Glucose-Capillary: 68 mg/dL — ABNORMAL LOW (ref 70–99)
Glucose-Capillary: 72 mg/dL (ref 70–99)

## 2021-02-15 LAB — RESP PANEL BY RT-PCR (FLU A&B, COVID) ARPGX2
Influenza A by PCR: NEGATIVE
Influenza B by PCR: NEGATIVE
SARS Coronavirus 2 by RT PCR: NEGATIVE

## 2021-02-15 LAB — BRAIN NATRIURETIC PEPTIDE: B Natriuretic Peptide: 122.7 pg/mL — ABNORMAL HIGH (ref 0.0–100.0)

## 2021-02-15 MED ORDER — ACETAMINOPHEN 325 MG PO TABS
650.0000 mg | ORAL_TABLET | Freq: Four times a day (QID) | ORAL | Status: DC | PRN
  Administered 2021-02-16 – 2021-02-20 (×3): 650 mg via ORAL
  Filled 2021-02-15 (×3): qty 2

## 2021-02-15 MED ORDER — INSULIN ASPART 100 UNIT/ML IJ SOLN
0.0000 [IU] | Freq: Three times a day (TID) | INTRAMUSCULAR | Status: DC
  Administered 2021-02-15 – 2021-02-16 (×3): 15 [IU] via SUBCUTANEOUS
  Administered 2021-02-16: 5 [IU] via SUBCUTANEOUS
  Administered 2021-02-17: 3 [IU] via SUBCUTANEOUS
  Administered 2021-02-17: 8 [IU] via SUBCUTANEOUS
  Filled 2021-02-15: qty 0.15

## 2021-02-15 MED ORDER — MORPHINE SULFATE (PF) 4 MG/ML IV SOLN
4.0000 mg | INTRAVENOUS | Status: AC | PRN
  Administered 2021-02-15 (×3): 4 mg via INTRAVENOUS
  Filled 2021-02-15 (×3): qty 1

## 2021-02-15 MED ORDER — ONDANSETRON HCL 4 MG/2ML IJ SOLN
4.0000 mg | Freq: Once | INTRAMUSCULAR | Status: AC
  Administered 2021-02-15: 4 mg via INTRAVENOUS

## 2021-02-15 MED ORDER — ONDANSETRON HCL 4 MG PO TABS: 4.0000 mg | ORAL_TABLET | Freq: Four times a day (QID) | ORAL | Status: DC | PRN

## 2021-02-15 MED ORDER — MORPHINE SULFATE (PF) 4 MG/ML IV SOLN
4.0000 mg | Freq: Once | INTRAVENOUS | Status: AC
  Administered 2021-02-15: 4 mg via INTRAVENOUS
  Filled 2021-02-15: qty 1

## 2021-02-15 MED ORDER — AMLODIPINE BESYLATE 10 MG PO TABS
10.0000 mg | ORAL_TABLET | Freq: Every day | ORAL | Status: DC
  Administered 2021-02-15 – 2021-02-17 (×3): 10 mg via ORAL
  Filled 2021-02-15: qty 2
  Filled 2021-02-15 (×2): qty 1

## 2021-02-15 MED ORDER — DEXTROSE 50 % IV SOLN
1.0000 | Freq: Once | INTRAVENOUS | Status: AC
  Administered 2021-02-16: 25 mL via INTRAVENOUS
  Filled 2021-02-15: qty 50

## 2021-02-15 MED ORDER — INSULIN ASPART 100 UNIT/ML IJ SOLN
15.0000 [IU] | Freq: Once | INTRAMUSCULAR | Status: DC
  Filled 2021-02-15: qty 0.15

## 2021-02-15 MED ORDER — MORPHINE SULFATE (PF) 4 MG/ML IV SOLN
4.0000 mg | INTRAVENOUS | Status: AC | PRN
  Administered 2021-02-16 (×2): 4 mg via INTRAVENOUS
  Filled 2021-02-15 (×2): qty 1

## 2021-02-15 MED ORDER — OXYCODONE HCL 5 MG PO TABS
5.0000 mg | ORAL_TABLET | Freq: Four times a day (QID) | ORAL | Status: DC | PRN
  Administered 2021-02-15 – 2021-02-21 (×13): 5 mg via ORAL
  Filled 2021-02-15 (×15): qty 1

## 2021-02-15 MED ORDER — ONDANSETRON HCL 4 MG/2ML IJ SOLN
4.0000 mg | Freq: Four times a day (QID) | INTRAMUSCULAR | Status: DC | PRN
  Administered 2021-02-15 – 2021-02-20 (×4): 4 mg via INTRAVENOUS
  Filled 2021-02-15 (×5): qty 2

## 2021-02-15 MED ORDER — FENTANYL CITRATE PF 50 MCG/ML IJ SOSY: 25.0000 ug | PREFILLED_SYRINGE | INTRAMUSCULAR | Status: DC | PRN

## 2021-02-15 MED ORDER — ACETAMINOPHEN 650 MG RE SUPP: 650.0000 mg | Freq: Four times a day (QID) | RECTAL | Status: DC | PRN

## 2021-02-15 MED ORDER — PANTOPRAZOLE SODIUM 40 MG PO TBEC
40.0000 mg | DELAYED_RELEASE_TABLET | Freq: Every day | ORAL | Status: DC
  Administered 2021-02-15 – 2021-02-21 (×7): 40 mg via ORAL
  Filled 2021-02-15 (×7): qty 1

## 2021-02-15 MED ORDER — INSULIN ASPART 100 UNIT/ML IJ SOLN: 0.0000 [IU] | Freq: Three times a day (TID) | INTRAMUSCULAR | Status: DC

## 2021-02-15 MED ORDER — OXYCODONE-ACETAMINOPHEN 5-325 MG PO TABS
1.0000 | ORAL_TABLET | Freq: Four times a day (QID) | ORAL | Status: DC | PRN
Start: 2021-02-15 — End: 2021-02-22
  Administered 2021-02-15 – 2021-02-21 (×14): 1 via ORAL
  Filled 2021-02-15 (×16): qty 1

## 2021-02-15 MED ORDER — GABAPENTIN 300 MG PO CAPS
600.0000 mg | ORAL_CAPSULE | Freq: Three times a day (TID) | ORAL | Status: DC | PRN
  Administered 2021-02-16 (×2): 600 mg via ORAL
  Filled 2021-02-15 (×3): qty 2

## 2021-02-15 MED ORDER — MILK AND MOLASSES ENEMA
1.0000 | Freq: Once | RECTAL | Status: AC
  Administered 2021-02-15: 240 mL via RECTAL
  Filled 2021-02-15 (×2): qty 240

## 2021-02-15 MED ORDER — FENTANYL CITRATE PF 50 MCG/ML IJ SOSY
50.0000 ug | PREFILLED_SYRINGE | INTRAMUSCULAR | Status: DC | PRN
  Administered 2021-02-15: 50 ug via INTRAVENOUS
  Filled 2021-02-15 (×2): qty 1

## 2021-02-15 MED ORDER — OXYCODONE-ACETAMINOPHEN 10-325 MG PO TABS: 1.0000 | ORAL_TABLET | Freq: Four times a day (QID) | ORAL | Status: DC | PRN

## 2021-02-15 MED ORDER — SENNOSIDES-DOCUSATE SODIUM 8.6-50 MG PO TABS
1.0000 | ORAL_TABLET | Freq: Two times a day (BID) | ORAL | Status: DC
  Administered 2021-02-15 – 2021-02-21 (×11): 1 via ORAL
  Filled 2021-02-15 (×11): qty 1

## 2021-02-15 MED ORDER — METRONIDAZOLE 500 MG/100ML IV SOLN
500.0000 mg | Freq: Two times a day (BID) | INTRAVENOUS | Status: DC
  Administered 2021-02-15: 500 mg via INTRAVENOUS
  Filled 2021-02-15: qty 100

## 2021-02-15 MED ORDER — FUROSEMIDE 10 MG/ML IJ SOLN
40.0000 mg | Freq: Two times a day (BID) | INTRAMUSCULAR | Status: DC
  Administered 2021-02-15 – 2021-02-16 (×2): 40 mg via INTRAVENOUS
  Filled 2021-02-15 (×2): qty 4

## 2021-02-15 MED ORDER — BISACODYL 5 MG PO TBEC
10.0000 mg | DELAYED_RELEASE_TABLET | Freq: Once | ORAL | Status: AC
  Administered 2021-02-15: 10 mg via ORAL
  Filled 2021-02-15: qty 2

## 2021-02-15 MED ORDER — GLUCOSE 40 % PO GEL
2.0000 | ORAL | Status: AC
  Administered 2021-02-15: 62 g via ORAL

## 2021-02-15 MED ORDER — INSULIN GLARGINE 100 UNIT/ML SOLOSTAR PEN: 5.0000 [IU] | PEN_INJECTOR | Freq: Every day | SUBCUTANEOUS | Status: DC

## 2021-02-15 MED ORDER — METRONIDAZOLE 500 MG PO TABS
500.0000 mg | ORAL_TABLET | Freq: Two times a day (BID) | ORAL | Status: DC
  Administered 2021-02-15: 500 mg via ORAL
  Filled 2021-02-15: qty 1

## 2021-02-15 MED ORDER — DEXTROSE 50 % IV SOLN
INTRAVENOUS | Status: AC
  Administered 2021-02-15: 50 mL
  Filled 2021-02-15: qty 50

## 2021-02-15 MED ORDER — INSULIN GLARGINE-YFGN 100 UNIT/ML ~~LOC~~ SOLN
5.0000 [IU] | Freq: Every day | SUBCUTANEOUS | Status: DC
  Administered 2021-02-15 – 2021-02-21 (×7): 5 [IU] via SUBCUTANEOUS
  Filled 2021-02-15 (×7): qty 0.05

## 2021-02-15 MED ORDER — CARVEDILOL 12.5 MG PO TABS
12.5000 mg | ORAL_TABLET | Freq: Two times a day (BID) | ORAL | Status: DC
  Administered 2021-02-15 – 2021-02-19 (×9): 12.5 mg via ORAL
  Filled 2021-02-15 (×9): qty 1

## 2021-02-15 MED ORDER — DEXTROSE 50 % IV SOLN: 25.0000 mL | Freq: Once | INTRAVENOUS | Status: DC

## 2021-02-15 MED ORDER — FUROSEMIDE 10 MG/ML IJ SOLN
20.0000 mg | Freq: Once | INTRAMUSCULAR | Status: AC
  Administered 2021-02-15: 20 mg via INTRAVENOUS
  Filled 2021-02-15: qty 4

## 2021-02-15 MED ORDER — MORPHINE SULFATE (PF) 2 MG/ML IV SOLN
2.0000 mg | INTRAVENOUS | Status: DC | PRN
Start: 2021-02-15 — End: 2021-02-15
  Administered 2021-02-15: 2 mg via INTRAVENOUS
  Filled 2021-02-15: qty 1

## 2021-02-15 NOTE — Progress Notes (Signed)
Informed of hypoglycemic event that is being managed per hypoglycemic standing orders. Bess Kinds, RN (bedside nurse) denies any other needs at this time. She will call back to RR RN if changes in condition or unable to improve hypoglycemia with prescribed interventions.

## 2021-02-15 NOTE — ED Provider Notes (Signed)
The patient was concerned about edema of her labia majora.  On pelvic exam the patient's labia majora are edematous but not erythematous, fluctuant, indurated or significantly tender.  There is some white vaginal discharge but the patient states she put some Monistat there earlier thinking she might have a yeast infection.  Wet prep and GC/chlamydia swabs sent to the lab.  Nursing notes and vitals signs, including pulse oximetry, reviewed.  Summary of this visit's results, reviewed by myself:  EKG:  EKG Interpretation  Date/Time:    Ventricular Rate:    PR Interval:    QRS Duration:   QT Interval:    QTC Calculation:   R Axis:     Text Interpretation:          Labs:  Results for orders placed or performed during the hospital encounter of 02/14/21 (from the past 24 hour(s))  CBG monitoring, ED     Status: Abnormal   Collection Time: 02/14/21  7:04 PM  Result Value Ref Range   Glucose-Capillary 349 (H) 70 - 99 mg/dL   Comment 1 Notify RN    Comment 2 Document in Chart   CBC with Differential     Status: Abnormal   Collection Time: 02/14/21  9:30 PM  Result Value Ref Range   WBC 12.5 (H) 4.0 - 10.5 K/uL   RBC 2.55 (L) 3.87 - 5.11 MIL/uL   Hemoglobin 7.4 (L) 12.0 - 15.0 g/dL   HCT 23.1 (L) 36.0 - 46.0 %   MCV 90.6 80.0 - 100.0 fL   MCH 29.0 26.0 - 34.0 pg   MCHC 32.0 30.0 - 36.0 g/dL   RDW 13.8 11.5 - 15.5 %   Platelets 338 150 - 400 K/uL   nRBC 0.0 0.0 - 0.2 %   Neutrophils Relative % 86 %   Neutro Abs 10.8 (H) 1.7 - 7.7 K/uL   Lymphocytes Relative 9 %   Lymphs Abs 1.1 0.7 - 4.0 K/uL   Monocytes Relative 4 %   Monocytes Absolute 0.5 0.1 - 1.0 K/uL   Eosinophils Relative 0 %   Eosinophils Absolute 0.0 0.0 - 0.5 K/uL   Basophils Relative 0 %   Basophils Absolute 0.0 0.0 - 0.1 K/uL   Immature Granulocytes 1 %   Abs Immature Granulocytes 0.06 0.00 - 0.07 K/uL  Comprehensive metabolic panel     Status: Abnormal   Collection Time: 02/14/21  9:30 PM  Result Value Ref  Range   Sodium 136 135 - 145 mmol/L   Potassium 5.2 (H) 3.5 - 5.1 mmol/L   Chloride 104 98 - 111 mmol/L   CO2 23 22 - 32 mmol/L   Glucose, Bld 405 (H) 70 - 99 mg/dL   BUN 43 (H) 6 - 20 mg/dL   Creatinine, Ser 1.98 (H) 0.44 - 1.00 mg/dL   Calcium 8.5 (L) 8.9 - 10.3 mg/dL   Total Protein 5.7 (L) 6.5 - 8.1 g/dL   Albumin 1.8 (L) 3.5 - 5.0 g/dL   AST 20 15 - 41 U/L   ALT 24 0 - 44 U/L   Alkaline Phosphatase 122 38 - 126 U/L   Total Bilirubin 1.2 0.3 - 1.2 mg/dL   GFR, Estimated 34 (L) >60 mL/min   Anion gap 9 5 - 15  Lipase, blood     Status: None   Collection Time: 02/14/21  9:30 PM  Result Value Ref Range   Lipase 19 11 - 51 U/L  Brain natriuretic peptide     Status:  Abnormal   Collection Time: 02/14/21  9:30 PM  Result Value Ref Range   B Natriuretic Peptide 122.7 (H) 0.0 - 100.0 pg/mL  POC beta hCG blood, ED     Status: Abnormal   Collection Time: 02/14/21  9:32 PM  Result Value Ref Range   I-stat hCG, quantitative 5.7 (H) <5 mIU/mL   Comment 3          Wet prep, genital     Status: Abnormal   Collection Time: 02/15/21 12:04 AM   Specimen: Vaginal; Genital  Result Value Ref Range   Yeast Wet Prep HPF POC NONE SEEN NONE SEEN   Trich, Wet Prep NONE SEEN NONE SEEN   Clue Cells Wet Prep HPF POC PRESENT (A) NONE SEEN   WBC, Wet Prep HPF POC MANY (A) NONE SEEN   Sperm NONE SEEN   CBG monitoring, ED     Status: Abnormal   Collection Time: 02/15/21 12:42 AM  Result Value Ref Range   Glucose-Capillary 406 (H) 70 - 99 mg/dL  Resp Panel by RT-PCR (Flu A&B, Covid) Nasopharyngeal Swab     Status: None   Collection Time: 02/15/21 12:58 AM   Specimen: Nasopharyngeal Swab; Nasopharyngeal(NP) swabs in vial transport medium  Result Value Ref Range   SARS Coronavirus 2 by RT PCR NEGATIVE NEGATIVE   Influenza A by PCR NEGATIVE NEGATIVE   Influenza B by PCR NEGATIVE NEGATIVE  CBG monitoring, ED     Status: Abnormal   Collection Time: 02/15/21  5:04 AM  Result Value Ref Range    Glucose-Capillary 461 (H) 70 - 99 mg/dL    Imaging Studies: CT ABDOMEN PELVIS WO CONTRAST  Result Date: 02/14/2021 CLINICAL DATA:  Acute abdominal pain, nonlocalized. Constipation for 1 week. Enemas without relief. EXAM: CT ABDOMEN AND PELVIS WITHOUT CONTRAST TECHNIQUE: Multidetector CT imaging of the abdomen and pelvis was performed following the standard protocol without IV contrast. COMPARISON:  10/31/2020 FINDINGS: Lower chest: Small to moderate bilateral pleural effusions with airspace disease in the lung bases, possibly pneumonia or edema. Diffuse soft tissue edema in the subcutaneous fat. Hepatobiliary: No focal liver abnormality is seen. No gallstones, gallbladder wall thickening, or biliary dilatation. Pancreas: Unremarkable. No pancreatic ductal dilatation or surrounding inflammatory changes. Spleen: Normal in size without focal abnormality. Adrenals/Urinary Tract: Adrenal glands are unremarkable. Kidneys are normal, without renal calculi, focal lesion, or hydronephrosis. Bladder is unremarkable. Stomach/Bowel: Stomach, small bowel, and colon are not abnormally distended. Stomach and small bowel are mostly decompressed. Scattered stool throughout the colon with prominent stool in the rectum. No wall thickening or inflammatory changes appreciated. Appendix is normal. Vascular/Lymphatic: Aortic atherosclerosis. No enlarged abdominal or pelvic lymph nodes. Reproductive: Uterus and ovaries are not enlarged. Surgical clips consistent with tubal ligations. Other: Moderate diffuse free fluid throughout the abdomen and pelvis, likely ascites. Diffuse edema throughout the subcutaneous fatty tissues. No free air. Abdominal wall musculature appears intact. Musculoskeletal: No acute or significant osseous findings. IMPRESSION: 1. Bilateral pleural effusions with airspace disease in the lung bases, possibly pneumonia or edema. 2. Diffuse edema throughout the subcutaneous fatty tissues. 3. Moderate free fluid  throughout the abdomen and pelvis, likely ascites. Effusions and edematous changes are new since the previous study. 4. Scattered stool throughout the colon. No evidence of bowel obstruction or inflammation. Electronically Signed   By: Lucienne Capers M.D.   On: 02/14/2021 23:52    The patient's anasarca, ascites and pleural effusions are new when compared to previous CT scan 10/30/2020.  We  will have her admitted for further work-up.     Roan Sawchuk, Jenny Reichmann, MD 02/15/21 [phone]

## 2021-02-15 NOTE — ED Notes (Signed)
Pt passed several chunks of 2inch brown compacted stool.

## 2021-02-15 NOTE — Progress Notes (Addendum)
Hypoglycemic Event  CBG: 34  Treatment: 12.5 ml dextrose   Symptoms: pale , hot flash , lethargic  Follow-up CBG: Time:2110 CBG Result:105  Possible Reasons for Event: unknown   Comments/MD notified: XX Blount NP informed , no new orders - standing orders were initiated, Spoke with [facility] - no new orders     [employer]

## 2021-02-15 NOTE — Progress Notes (Signed)
PROGRESS NOTE    Ann Robertson  [id number] DOB: [birth year] DOA: 02/14/2021 PCP: [facility]    Brief Narrative:  Mrs. Ann Robertson was admitted to the hospital with the working diagnosis of constipation.   30 year old female past medical history for type 1 diabetes mellitus, dyslipidemia, and diabetic neuropathy who presented with abdominal pain and constipation.  Multiple laxatives at home without improvement.  No nausea or vomiting.  On her initial physical examination temperature 98.9, heart rate 109, respiratory rate 18, blood pressure 133/85, oxygen saturation 100% on room air.  Ill-looking appearing, dry mucous membranes, decreased breath sounds at bases, heart S1-S2, present, tachycardic, positive abdominal wall edema, positive tenderness bilateral lower quadrants, no lower extremity edema.  Sodium 136, potassium 5.2, chloride 104, bicarb 23, glucose 405, BUN 43, creatinine 1.98, AST 20, ALT 24, total bilirubin is 1.2, BNP 122.7, white count 12.5, hemoglobin 7.4, hematocrit 23.1, platelets 338. SARS COVID-19 negative.  Urinalysis specific gravity 1.020, > 300 protein, 21-50 white cells, 11-20 red cells. >  500 glucose.  CT abdomen and pelvis with bilateral pleural effusions, diffuse edema through the subcutaneous fat tissues.  Moderate free fluid through the abdomen and pelvis.  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 Patient continue to have constipation despite enema, positive abdominal pain.  No nausea or vomiting.   Plan to add oral dulcolax 10 mg x1 and continue with bid miralax.  Out of bed to chair tid with meals and ambulate in the hallway as tolerated.   2. T1DM  uncontrolled hyperglycemia. Patient his am in the 500's this pm capillary glucose is 53 and 72. Continue insulin sliding scale for glucose cover and monitoring, continue with basal insulin 5 units.   3. CKD stage 3b, hyponatremia, hyperkalemia, anion gap metabolic acidosis.  Positive edema at the lower extremities and hypo-proteinemia. Positive proteinuria on urine analysis. Patient has a high risk for diabetic nephropathy and nephrotic syndrome.  Check urine protein creatinine ratio.   Start patient with furosemide 40 mg Iv q12 hrs Follow up renal function in am.   4. Chronic anemia. Hgb is 7,3 with hct at 22,4 platelets up to 331 they were as high as 503 in 09.04.22. Will check iron stores.  Positive leukocytosis likely reactive. Hold on antibiotic therapy for now, no clinical evidence of bacterial infection     Patient continue to be at high risk for worsening renal function   Status is: Observation  The patient remains OBS appropriate and will d/c before 2 midnights.  Dispo: The patient is from: Home              Anticipated d/c is to: Home              Patient currently is not medically stable to d/c.   Difficult to place patient No  DVT prophylaxis: Enoxaparin   Code Status:    full  Family Communication:   No family at the bedside       Subjective: Patient with no nausea or vomiting, positive edema lower extremities, very weak and deconditioned, continue to have abdominal pain and constipation.   Objective: Vitals:   02/15/21 1044 02/15/21 1300 02/15/21 1349 02/15/21 1434  BP:  121/85 121/83  (!) 125/93  Pulse: (!) 104 (!) 118  (!) 109  Resp: '18 17  18  '$ Temp:   98 F (36.7 C) 98.5 F (36.9 C)  TempSrc:   Oral Oral  SpO2: 100% 99%  98%  Weight:    67.7 kg  Height:    '5\' 4"'$  (1.626 m)   No intake or output data in the 24 hours ending 02/15/21 1555 Filed Weights   02/15/21 0716 02/15/21 1434  Weight: 60 kg 67.7 kg    Examination:   General: Not in pain  or dyspnea. Deconditioned and ill looking appearing  Neurology: Awake and alert, non focal  E ENT: positive pallor, no icterus, oral mucosa moist Cardiovascular: no JVD. S1-S2 present, rhythmic, no gallops, rubs, or murmurs. +++ no pitting bilateral lower extremity edema. Pulmonary: vesicular breath sounds bilaterally, adequate air movement, no wheezing, rhonchi or rales. Gastrointestinal. Abdomen mild distended but not tender to superficial palpation  Skin. No rashes Musculoskeletal: no joint deformities     Data Reviewed: I have personally reviewed following labs and imaging studies  CBC: Recent Labs  Lab 02/14/21 2130 02/15/21 0514  WBC 12.5* 11.6*  NEUTROABS 10.8*  --   HGB 7.4* 7.3*  HCT 23.1* 22.4*  MCV 90.6 90.7  PLT 338 [id number]   Basic Metabolic Panel: Recent Labs  Lab 02/14/21 2130 02/15/21 0514  NA 136 134*  K 5.2* 5.0  CL 104 100  CO2 23 20*  GLUCOSE 405* 511*  BUN 43* 49*  CREATININE 1.98* 2.10*  CALCIUM 8.5* 8.2*   GFR: Estimated Creatinine Clearance: 36.4 mL/min (A) (by C-G formula based on SCr of 2.1 mg/dL (H)). Liver Function Tests: Recent Labs  Lab 02/14/21 2130  AST 20  ALT 24  ALKPHOS 122  BILITOT 1.2  PROT 5.7*  ALBUMIN 1.8*   Recent Labs  Lab 02/14/21 2130  LIPASE 19   No results for input(s): AMMONIA in the last 168 hours. Coagulation Profile: No results for input(s): INR, PROTIME in the last 168 hours. Cardiac Enzymes: No results for input(s): CKTOTAL, CKMB, CKMBINDEX, TROPONINI in the last 168 hours. BNP (last 3 results) No results for input(s): PROBNP in the last 8760 hours. HbA1C: No results for input(s): HGBA1C in the last 72 hours. CBG: Recent Labs  Lab 02/15/21 0042 02/15/21 0504 02/15/21 0726 02/15/21 1055 02/15/21 1345  GLUCAP 406* 461* 363* 167* 110*   Lipid Profile: No results for input(s): CHOL, HDL, LDLCALC, TRIG, CHOLHDL, LDLDIRECT in the last 72 hours. Thyroid Function Tests: No results for input(s): TSH,  T4TOTAL, FREET4, T3FREE, THYROIDAB in the last 72 hours. Anemia Panel: No results for input(s): VITAMINB12, FOLATE, FERRITIN, TIBC, IRON, RETICCTPCT in the last 72 hours.    Radiology Studies: I have reviewed all of the imaging during this hospital visit personally     Scheduled Meds:  amLODipine  10 mg Oral Daily   bisacodyl  10 mg Oral Once   carvedilol  12.5 mg Oral BID WC   insulin aspart  0-15 Units Subcutaneous TID WC   insulin glargine-yfgn  5 Units Subcutaneous Daily   metroNIDAZOLE  500 mg Oral Q12H   pantoprazole  40 mg Oral Daily   senna-docusate  1 tablet Oral BID   Continuous Infusions:   LOS: 0 days        Camila Maita Gerome Apley, MD

## 2021-02-15 NOTE — ED Notes (Signed)
CBG resulted 406 mg/dL at 0045

## 2021-02-15 NOTE — H&P (Signed)
History and Physical    Ann Robertson [id number] DOB: February 02, 1991 DOA: 02/14/2021  PCP: [facility] [place]  Patient coming from: Home.  I have personally briefly reviewed patient's old medical records in [place]  Chief Complaint: Swelling, abdominal pain and constipation.  HPI: Ann Robertson is a 30 y.o. 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  She has tried enemas and laxatives at home without significant relief.  She denied emesis, melena or hematochezia.  No dysuria, frequency, flank pain or hematuria.  Denied fever, chills, sore throat, rhinorrhea, wheezing or hemoptysis.  She has had a significant pitting edema edema recently associated with mild dyspnea, but no chest pain, palpitations, diaphoresis, dizziness, PND, orthopnea.  No polyuria, polydipsia, polyphagia or blurred vision.  She saw her doctor recently, was prescribed furosemide and enalapril, but has not started them yet.  She takes max dose amlodipine which is probably making the peripheral pitting edema worse.  ED Course: Initial vital signs were temperature 98.9 F, pulse 109, respiration 18, BP 133/85 mmHg and O2 sat 100% on room air.  The patient received 4 mg of morphine and 4 mg of ondansetron.  I added fentanyl IVP and a milk/molasses enema.  Lab work: Her CBC showed a white count of 12.5 with 86% neutrophils, hemoglobin 7.4 g/dL platelets 338.  Lipase was normal.  BNP was 122.7 pg/mL.  CMP showed a potassium of 5.2 mmol/L.  All other electrolytes were normal for calcium corrected to albumin.  Glucose was 405, BUN 43 and creatinine 1.98 mg/dL.  Total protein 5.7 and albumin of 1.8 g/dL.  The rest of the hepatic functions were normal.  Wet prep was positive for  WBC and clue cells presence.  Imaging: CT abdomen/pelvis with contrast show bilateral pleural effusion with airspace disease in the lung bases possibly pneumonia or edema.  There was diffuse edema throughout the subcutaneous fatty tissues.  Moderate free fluid throughout the abdomen and pelvis likely ascites.  Scattered stool throughout the colon.  Please see images and full radiology report for further details.  Review of Systems: As per HPI otherwise all other systems reviewed and are negative.  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   Neuropathy    Preeclampsia    STD (sexually transmitted disease)    Past Surgical History:  Procedure Laterality Date   CESAREAN SECTION     CESAREAN SECTION N/A 07/05/2013   Procedure: CESAREAN SECTION;  Surgeon: Emily Filbert, MD;  Location: [facility];  Service: Obstetrics;  Laterality: N/A;   CESAREAN SECTION N/A 07/25/2014   Procedure: CESAREAN SECTION;  Surgeon: Donnamae Jude, MD;  Location: [facility];  Service: Obstetrics;  Laterality: N/A;   CESAREAN SECTION N/A 11/21/2015   Procedure: CESAREAN SECTION;  Surgeon: Donnamae Jude, MD;  Location: [facility];  Service: Obstetrics;  Laterality: N/A;   WISDOM TOOTH EXTRACTION     Social History  reports that she has been smoking cigarettes. She has a 2.25 pack-year smoking history. She has never used smokeless tobacco. She reports current alcohol use. She reports that she does not use drugs.  Allergies  Allergen Reactions   Hydromorphone Hcl Itching and Rash   Family History  Problem Relation Age  of Onset   Stroke Mother    Hypertension Mother    Heart disease Mother    Kidney disease Mother    Hyperlipidemia Father    Diabetes Father    Prior to Admission medications   Medication Sig Start Date End Date Taking? Authorizing Provider  amLODipine (NORVASC) 10 MG tablet Take 1 tablet (10 mg total) by mouth daily. 02/03/21  Yes  Dwyane Dee, MD  carvedilol (COREG) 12.5 MG tablet Take 1 tablet (12.5 mg total) by mouth 2 (two) times daily with a meal. 02/02/21  Yes Dwyane Dee, MD  furosemide (LASIX) 20 MG tablet Take 20 mg by mouth 2 (two) times daily.   Yes [provider]  gabapentin (NEURONTIN) 600 MG tablet Take 600 mg by mouth 3 (three) times daily as needed (pain). 10/28/20  Yes [provider]  insulin glargine (LANTUS SOLOSTAR) 100 UNIT/ML Solostar Pen Inject 5 Units into the skin daily. And pen needles 1/day 02/02/21  Yes Dwyane Dee, MD  insulin lispro (HUMALOG) 100 UNIT/ML KwikPen Inject 2-12 Units into the skin in the morning, at noon, and at bedtime. Per sliding scale   Yes [provider]  omeprazole (PRILOSEC) 20 MG capsule Take 20 mg by mouth daily.   Yes [provider]  ondansetron (ZOFRAN) 4 MG tablet Take 1 tablet (4 mg total) by mouth every 8 (eight) hours as needed for nausea or vomiting. 06/10/20  Yes Renato Shin, MD  oxyCODONE-acetaminophen (PERCOCET) 10-325 MG tablet Take 1 tablet by mouth every 4 (four) hours as needed for pain. 02/13/21  Yes [provider]  pantoprazole (PROTONIX) 40 MG tablet Take 1 tablet (40 mg total) by mouth daily. 02/03/21  Yes Dwyane Dee, MD  Vitamin D, Ergocalciferol, (DRISDOL) 1.25 MG (50000 UNIT) CAPS capsule Take 50,000 Units by mouth every Wednesday.   Yes [provider]  Continuous Blood Gluc Receiver (FREESTYLE LIBRE 2 READER) DEVI 1 each by Does not apply route in the morning, at noon, in the evening, and at bedtime. 10/31/20   Brimage, Ronnette Juniper, DO  enalapril (VASOTEC) 5 MG tablet Take by mouth. 02/14/21   [provider]  Insulin Pen Needle (PEN NEEDLES) 30G X 8 MM MISC 1 each by Does not apply route daily. E11.9 05/14/19   Renato Shin, MD   Physical Exam: Vitals:   02/15/21 0200 02/15/21 0215 02/15/21 0230 02/15/21 0300  BP: (!) 137/97  (!) 139/95 129/89  Pulse: (!) 105 (!) 106 (!) 108 (!) 103   Resp: '11 10 11 16  '$ Temp:      TempSrc:      SpO2: 99% 98% 97% 95%   Constitutional: Chronically ill-appearing.  NAD, calm, comfortable Eyes: PERRL, lids and conjunctivae normal ENMT: Mucous membranes are mildly dry.  Posterior pharynx clear of any exudate or lesions. Neck: normal, supple, no masses, no thyromegaly Respiratory: Decreased breath sounds in bases, otherwise clear to auscultation bilaterally, no wheezing, no crackles. Normal respiratory effort. No accessory muscle use.  Cardiovascular: Sinus tachycardia in the 110s, no murmurs / rubs / gallops. No extremity edema. 2+ pedal pulses. No carotid bruits.  Abdomen: No distention.  Positive abdominal wall edema.  Bowel sounds positive.  Soft.  Bilateral lower quadrant tenderness without guarding or rebound, no masses palpated. No hepatosplenomegaly. Genitourinary: Per Dr. Florina Ou: Nontender, edematous labia majora with residual Monistat cream from earlier application. Musculoskeletal: no clubbing / cyanosis. Good ROM, no contractures. Normal muscle tone.  Skin: no acute rashes, lesions, ulcers on very limited dermatological  examination. Neurologic: CN 2-12 grossly intact. Sensation intact, DTR normal. Strength 5/5 in all 4.  Psychiatric: Normal judgment and insight. Alert and oriented x 3. Normal mood.   Labs on Admission: I have personally reviewed following labs and imaging studies  CBC: Recent Labs  Lab 02/14/21 2130  WBC 12.5*  NEUTROABS 10.8*  HGB 7.4*  HCT 23.1*  MCV 90.6  PLT [id number]   Basic Metabolic Panel: Recent Labs  Lab 02/14/21 2130  NA 136  K 5.2*  CL 104  CO2 23  GLUCOSE 405*  BUN 43*  CREATININE 1.98*  CALCIUM 8.5*   GFR: CrCl cannot be calculated (Unknown ideal weight.).  Liver Function Tests: Recent Labs  Lab 02/14/21 2130  AST 20  ALT 24  ALKPHOS 122  BILITOT 1.2  PROT 5.7*  ALBUMIN 1.8*   Radiological Exams on Admission: CT ABDOMEN PELVIS WO CONTRAST  Result Date: 02/14/2021 CLINICAL  DATA:  Acute abdominal pain, nonlocalized. Constipation for 1 week. Enemas without relief. EXAM: CT ABDOMEN AND PELVIS WITHOUT CONTRAST TECHNIQUE: Multidetector CT imaging of the abdomen and pelvis was performed following the standard protocol without IV contrast. COMPARISON:  10/31/2020 FINDINGS: Lower chest: Small to moderate bilateral pleural effusions with airspace disease in the lung bases, possibly pneumonia or edema. Diffuse soft tissue edema in the subcutaneous fat. Hepatobiliary: No focal liver abnormality is seen. No gallstones, gallbladder wall thickening, or biliary dilatation. Pancreas: Unremarkable. No pancreatic ductal dilatation or surrounding inflammatory changes. Spleen: Normal in size without focal abnormality. Adrenals/Urinary Tract: Adrenal glands are unremarkable. Kidneys are normal, without renal calculi, focal lesion, or hydronephrosis. Bladder is unremarkable. Stomach/Bowel: Stomach, small bowel, and colon are not abnormally distended. Stomach and small bowel are mostly decompressed. Scattered stool throughout the colon with prominent stool in the rectum. No wall thickening or inflammatory changes appreciated. Appendix is normal. Vascular/Lymphatic: Aortic atherosclerosis. No enlarged abdominal or pelvic lymph nodes. Reproductive: Uterus and ovaries are not enlarged. Surgical clips consistent with tubal ligations. Other: Moderate diffuse free fluid throughout the abdomen and pelvis, likely ascites. Diffuse edema throughout the subcutaneous fatty tissues. No free air. Abdominal wall musculature appears intact. Musculoskeletal: No acute or significant osseous findings. IMPRESSION: 1. Bilateral pleural effusions with airspace disease in the lung bases, possibly pneumonia or edema. 2. Diffuse edema throughout the subcutaneous fatty tissues. 3. Moderate free fluid throughout the abdomen and pelvis, likely ascites. Effusions and edematous changes are new since the previous study. 4. Scattered  stool throughout the colon. No evidence of bowel obstruction or inflammation. Electronically Signed   By: Lucienne Capers M.D.   On: 02/14/2021 23:52    EKG: Independently reviewed.   Assessment/Plan Principal Problem:   Anasarca Due to:   Hypoalbuminemia In the setting of:   Diabetic nephropathy Observation/telemetry. BNP slightly elevated. Will obtain an EKG Check echocardiogram. Hold ACE inhibitor (hyperkalemia). Furosemide 20 mg IVP x1. Monitor daily weights. Monitor intake and output. Follow GFR and electrolytes.  Active Problems:   Constipation and abdominal pain Improved with enema. Continue analgesics as needed. Continue antiemetics as needed. Begin stool softeners.    Sinus tachycardia This is a chronic issue. As seen on multiple previous EKGs. Likely multifactorial in the setting of: Anemia, anasarca and physical deconditioning. Check echocardiogram.    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) Last hemoglobin A1c 12.0% 3 weeks ago. Has had some treatment compliance issues before. Resume carbohydrate modified diet. Continue long-acting insulin 5 units daily. CBG monitoring with RI SS.    Normocytic anemia  Monitor hematocrit and hemoglobin. Transfuse as needed.    HTN (hypertension) Continue amlodipine 10 mg p.o. daily. Amlodipine may be contributing to pitting edema. Continue carvedilol 12.5 mg p.o. twice daily.    Hyperkalemia Hold initiation of enalapril. Follow-up potassium level.   DVT prophylaxis: SCDs. Code Status:   Full code. Family Communication:  Her significant other was present in the room and provided HPI. Disposition Plan:   Patient is from:  Home.  Anticipated DC to:  Home.  Anticipated DC date:  02/15/2021 or 02/16/2021.  Anticipated DC barriers: Clinical status.  Consults called:   Admission status:  Observation/telemetry.  High severity after presenting with anasarca, abdominal pain, nausea, constipation with  rectal pain.  Severity of Illness:  Reubin Milan MD [facility]  How to contact the [facility] Attending or Consulting provider [place] or covering provider during after hours [place], for this patient?   Check the care team in [facility] and look for a) attending/consulting [facility] provider listed and b) the [facility] team listed Log into [email or web address] and use [place]'s universal password to access. If you do not have the password, please contact the hospital operator. Locate the [facility] provider you are looking for under [facility] and page to a number that you can be directly reached. If you still have difficulty reaching the provider, please page the [facility] (Director on Call) for the Hospitalists listed on amion for assistance.  02/15/2021, 4:33 AM   This document was prepared using Dragon voice recognition software and may contain some unintended transcription errors.

## 2021-02-15 NOTE — Progress Notes (Signed)
Hypoglycemic Event  CBG: 58  Treatment: 8 oz juice/soda  Symptoms: Pale  Follow-up CBG: Time: 1730 CBG Result: 72  Possible Reasons for Event: Unknown    Ann Robertson, Ann Robertson

## 2021-02-16 ENCOUNTER — Inpatient Hospital Stay (HOSPITAL_COMMUNITY): Payer: Medicaid Other

## 2021-02-16 DIAGNOSIS — R188 Other ascites: Secondary | ICD-10-CM | POA: Diagnosis present

## 2021-02-16 DIAGNOSIS — E8770 Fluid overload, unspecified: Secondary | ICD-10-CM | POA: Diagnosis present

## 2021-02-16 DIAGNOSIS — E7800 Pure hypercholesterolemia, unspecified: Secondary | ICD-10-CM | POA: Diagnosis present

## 2021-02-16 DIAGNOSIS — J90 Pleural effusion, not elsewhere classified: Secondary | ICD-10-CM | POA: Diagnosis present

## 2021-02-16 DIAGNOSIS — Z885 Allergy status to narcotic agent status: Secondary | ICD-10-CM | POA: Diagnosis not present

## 2021-02-16 DIAGNOSIS — E872 Acidosis: Secondary | ICD-10-CM | POA: Diagnosis present

## 2021-02-16 DIAGNOSIS — E875 Hyperkalemia: Secondary | ICD-10-CM | POA: Diagnosis present

## 2021-02-16 DIAGNOSIS — E8809 Other disorders of plasma-protein metabolism, not elsewhere classified: Secondary | ICD-10-CM | POA: Diagnosis present

## 2021-02-16 DIAGNOSIS — Z794 Long term (current) use of insulin: Secondary | ICD-10-CM | POA: Diagnosis not present

## 2021-02-16 DIAGNOSIS — E871 Hypo-osmolality and hyponatremia: Secondary | ICD-10-CM | POA: Diagnosis present

## 2021-02-16 DIAGNOSIS — F17210 Nicotine dependence, cigarettes, uncomplicated: Secondary | ICD-10-CM | POA: Diagnosis present

## 2021-02-16 DIAGNOSIS — I129 Hypertensive chronic kidney disease with stage 1 through stage 4 chronic kidney disease, or unspecified chronic kidney disease: Secondary | ICD-10-CM | POA: Diagnosis present

## 2021-02-16 DIAGNOSIS — E1042 Type 1 diabetes mellitus with diabetic polyneuropathy: Secondary | ICD-10-CM | POA: Diagnosis present

## 2021-02-16 DIAGNOSIS — Z20822 Contact with and (suspected) exposure to covid-19: Secondary | ICD-10-CM | POA: Diagnosis present

## 2021-02-16 DIAGNOSIS — I160 Hypertensive urgency: Secondary | ICD-10-CM | POA: Diagnosis present

## 2021-02-16 DIAGNOSIS — E1022 Type 1 diabetes mellitus with diabetic chronic kidney disease: Secondary | ICD-10-CM | POA: Diagnosis present

## 2021-02-16 DIAGNOSIS — Z79899 Other long term (current) drug therapy: Secondary | ICD-10-CM | POA: Diagnosis not present

## 2021-02-16 DIAGNOSIS — D631 Anemia in chronic kidney disease: Secondary | ICD-10-CM | POA: Diagnosis present

## 2021-02-16 DIAGNOSIS — R601 Generalized edema: Secondary | ICD-10-CM | POA: Diagnosis not present

## 2021-02-16 DIAGNOSIS — N1832 Chronic kidney disease, stage 3b: Secondary | ICD-10-CM | POA: Diagnosis present

## 2021-02-16 DIAGNOSIS — N179 Acute kidney failure, unspecified: Secondary | ICD-10-CM | POA: Diagnosis present

## 2021-02-16 DIAGNOSIS — E1065 Type 1 diabetes mellitus with hyperglycemia: Secondary | ICD-10-CM | POA: Diagnosis present

## 2021-02-16 DIAGNOSIS — G8929 Other chronic pain: Secondary | ICD-10-CM | POA: Diagnosis present

## 2021-02-16 DIAGNOSIS — D72829 Elevated white blood cell count, unspecified: Secondary | ICD-10-CM | POA: Diagnosis present

## 2021-02-16 DIAGNOSIS — K5900 Constipation, unspecified: Secondary | ICD-10-CM | POA: Diagnosis present

## 2021-02-16 LAB — CBC WITH DIFFERENTIAL/PLATELET
Abs Immature Granulocytes: 0.09 10*3/uL — ABNORMAL HIGH (ref 0.00–0.07)
Basophils Absolute: 0 10*3/uL (ref 0.0–0.1)
Basophils Relative: 0 %
Eosinophils Absolute: 0.1 10*3/uL (ref 0.0–0.5)
Eosinophils Relative: 1 %
HCT: 21.3 % — ABNORMAL LOW (ref 36.0–46.0)
Hemoglobin: 7.1 g/dL — ABNORMAL LOW (ref 12.0–15.0)
Immature Granulocytes: 1 %
Lymphocytes Relative: 13 %
Lymphs Abs: 1.3 10*3/uL (ref 0.7–4.0)
MCH: 29.2 pg (ref 26.0–34.0)
MCHC: 33.3 g/dL (ref 30.0–36.0)
MCV: 87.7 fL (ref 80.0–100.0)
Monocytes Absolute: 0.5 10*3/uL (ref 0.1–1.0)
Monocytes Relative: 5 %
Neutro Abs: 8.7 10*3/uL — ABNORMAL HIGH (ref 1.7–7.7)
Neutrophils Relative %: 80 %
Platelets: 379 10*3/uL (ref 150–400)
RBC: 2.43 MIL/uL — ABNORMAL LOW (ref 3.87–5.11)
RDW: 13.3 % (ref 11.5–15.5)
WBC: 10.7 10*3/uL — ABNORMAL HIGH (ref 4.0–10.5)
nRBC: 0 % (ref 0.0–0.2)

## 2021-02-16 LAB — GLUCOSE, CAPILLARY
Glucose-Capillary: 113 mg/dL — ABNORMAL HIGH (ref 70–99)
Glucose-Capillary: 213 mg/dL — ABNORMAL HIGH (ref 70–99)
Glucose-Capillary: 279 mg/dL — ABNORMAL HIGH (ref 70–99)
Glucose-Capillary: 397 mg/dL — ABNORMAL HIGH (ref 70–99)
Glucose-Capillary: 240 mg/dL — ABNORMAL HIGH (ref 70–99)
Glucose-Capillary: 94 mg/dL (ref 70–99)

## 2021-02-16 LAB — BASIC METABOLIC PANEL
Anion gap: 9 (ref 5–15)
BUN: 46 mg/dL — ABNORMAL HIGH (ref 6–20)
CO2: 25 mmol/L (ref 22–32)
Calcium: 8.4 mg/dL — ABNORMAL LOW (ref 8.9–10.3)
Chloride: 102 mmol/L (ref 98–111)
Creatinine, Ser: 2.22 mg/dL — ABNORMAL HIGH (ref 0.44–1.00)
GFR, Estimated: 29 mL/min — ABNORMAL LOW (ref >60)
Glucose, Bld: 168 mg/dL — ABNORMAL HIGH (ref 70–99)
Potassium: 4.8 mmol/L (ref 3.5–5.1)
Sodium: 136 mmol/L (ref 135–145)

## 2021-02-16 LAB — GC/CHLAMYDIA PROBE AMP (~~LOC~~) NOT AT ARMC
Chlamydia: NEGATIVE
Comment: NEGATIVE
Comment: NORMAL
Neisseria Gonorrhea: NEGATIVE

## 2021-02-16 MED ORDER — METRONIDAZOLE 500 MG PO TABS
500.0000 mg | ORAL_TABLET | Freq: Two times a day (BID) | ORAL | Status: DC
  Administered 2021-02-16 – 2021-02-21 (×11): 500 mg via ORAL
  Filled 2021-02-16 (×11): qty 1

## 2021-02-16 MED ORDER — LACTULOSE ENEMA
300.0000 mL | Freq: Once | ORAL | Status: AC
  Administered 2021-02-16: 300 mL via RECTAL
  Filled 2021-02-16: qty 300

## 2021-02-16 MED ORDER — DEXTROSE 10 % IV SOLN: INTRAVENOUS | Status: AC

## 2021-02-16 MED ORDER — SODIUM CHLORIDE 0.9 % IV SOLN: INTRAVENOUS | Status: DC

## 2021-02-16 MED ORDER — DEXTROSE 50 % IV SOLN: 25.0000 mL | Freq: Once | INTRAVENOUS | Status: AC

## 2021-02-16 MED ORDER — POLYETHYLENE GLYCOL 3350 17 G PO PACK
17.0000 g | PACK | Freq: Every day | ORAL | Status: DC | PRN
  Administered 2021-02-16: 17 g via ORAL
  Filled 2021-02-16: qty 1

## 2021-02-16 MED ORDER — BISACODYL 10 MG RE SUPP
10.0000 mg | Freq: Every day | RECTAL | Status: DC | PRN
  Administered 2021-02-16 – 2021-02-17 (×2): 10 mg via RECTAL
  Filled 2021-02-16 (×2): qty 1

## 2021-02-16 MED ORDER — HYDROCORTISONE (PERIANAL) 2.5 % EX CREA
TOPICAL_CREAM | Freq: Two times a day (BID) | CUTANEOUS | Status: DC
  Filled 2021-02-16: qty 28.35

## 2021-02-16 MED ORDER — DEXTROSE 50 % IV SOLN
12.5000 g | INTRAVENOUS | Status: AC
  Administered 2021-02-15: 12.5 g via INTRAVENOUS

## 2021-02-16 MED ORDER — MORPHINE SULFATE (PF) 2 MG/ML IV SOLN
2.0000 mg | INTRAVENOUS | Status: AC | PRN
  Administered 2021-02-16 (×2): 2 mg via INTRAVENOUS
  Filled 2021-02-16 (×2): qty 1

## 2021-02-16 MED ORDER — METRONIDAZOLE 500 MG PO TABS: 500.0000 mg | ORAL_TABLET | Freq: Two times a day (BID) | ORAL | Status: DC

## 2021-02-16 NOTE — Progress Notes (Signed)
Appro 2030 the tele sitter was initiate an eplaine to pt & her fiance as to the purpose of such monitoring. Pt is upset about this an when I entere the room at this time she has the blanket pulle up over her hea ( total boy covere) she I not respon when I calle her name or turne on lights.

## 2021-02-16 NOTE — Progress Notes (Signed)
Hypoglycemic Event  CBG: 55  Treatment: D50 25 mL (12.5 gm)  Symptoms: lethargic, pale  Follow-up CBG: Time:0245 CBG Result:113  Possible Reasons for Event: Inadequate meal intake, Unknown, and Other: disease process.  Comments/MD notified: new order for dextrose 10 continuous IV fluid.     Gladies Sofranko Leilani Nahdia Doucet

## 2021-02-16 NOTE — Progress Notes (Signed)
Called to patient's room, due to suspicious behavior. Patient noted to be taking medications from her bag from home. Patient very defensive of bag, so bag taken from room and given to family member to take home. Consulted with security and [facility] and all in agreement to add a telemonitor and ask the boyfriend to check all belongings at the secretary desk.

## 2021-02-16 NOTE — Progress Notes (Signed)
Hypoglycemic Event  CBG: 68  Treatment: 31m / 12.'5mg'$    Symptoms: lethargy  Follow-up CBG: Time:2344 CBG Result:55  Possible Reasons for Event: unknown  Comments/MD notified:X [place] NP     DLarence Penning

## 2021-02-16 NOTE — Progress Notes (Signed)
   02/16/21 1246  Assess: MEWS Score  Temp 98.9 F (37.2 C)  BP 122/87  Pulse Rate (!) 112  Resp 20  Level of Consciousness Alert  SpO2 99 %  O2 Device Room Air  Patient Activity (if Appropriate) In bed  Assess: MEWS Score  MEWS Temp 0  MEWS Systolic 0  MEWS Pulse 2  MEWS RR 0  MEWS LOC 0  MEWS Score 2  MEWS Score Color Yellow  Assess: if the MEWS score is Yellow or Red  Were vital signs taken at a resting state? Yes  Focused Assessment Change from prior assessment (see assessment flowsheet)  Does the patient meet 2 or more of the SIRS criteria? No  MEWS guidelines implemented *See Row Information* Yes  Treat  MEWS Interventions Administered prn meds/treatments  Take Vital Signs  Increase Vital Sign Frequency  Yellow: Q 2hr X 2 then Q 4hr X 2, if remains yellow, continue Q 4hrs  Escalate  MEWS: Escalate Yellow: discuss with charge nurse/RN and consider discussing with provider and RRT  Notify: Charge Nurse/RN  Name of Charge Nurse/RN Notified casey,RN  Date Charge Nurse/RN Notified 02/16/21  Time Charge Nurse/RN Notified 1253  Notify: Provider  Provider Name/Title Dahal MD  Date Provider Notified 02/16/21  Time Provider Notified 1257  Notification Type Page  Notification Reason Other (Comment) (HR 115)  Provider response No new orders  Date of Provider Response 02/16/21  Document  Patient Outcome Other (Comment) (stable remains on unit)  Assess: SIRS CRITERIA  SIRS Temperature  0  SIRS Pulse 1  SIRS Respirations  0  SIRS WBC 0  SIRS Score Sum  1  Pt is alert and oriented. Pt stable and started on yellow MEWS. Will continue monitor the pt.

## 2021-02-16 NOTE — Progress Notes (Signed)
PROGRESS NOTE  Ann Robertson  DOB: June 08, 1990  PCP: [facility] [place] [id number]  [place]: 02/14/2021  LOS: 0 days  Hospital Day: 3   Chief Complaint  Patient presents with   Constipation    Brief narrative: Ann Robertson is a 30 y.o. female with PMH significant for 30 year old female PMH of DM1, HLD, diabetic neuropathy. Patient presented to the ED on 9/17 with complaint of abdominal pain, constipation despite multiple laxatives at home.    In the ED, she was hemodynamically stable.   CT abdomen and pelvis showed scattered stool throughout the colon without evidence of bowel obstruction or inflammation.  She also had moderate free fluid throughout the abdomen pelvis likely ascites.  She had diffuse edema throughout the subcutaneous fatty tissue as well as bilateral pleural effusion. Urinalysis showed more than 300 proteins. Admitted to hospitalist service. See below for details  Subjective: Patient was seen and examined this morning. Pleasant young African-American female.  Looks pale. Complains of persistent abdominal pain.  Reports he had liquidy bowel movement in the ED and not since then.  She is asking for more morphine for abdominal pain.  States it hurts every time she has a bowel movement.  Assessment/Plan: Severe constipation -Presented with abdominal pain, constipation for several days not responding to laxatives at home.  Per note, she passed few chunks of hard stool while in the ED but no bowel movement since then.   -Patient states an enema was tried but did not help at all.   -I will give her a dose of Dulcolax as well as MiraLAX today.  If no bowel movement in next few hours, will try lactulose enema or GoLytely.  -For pain control, I would only do low-dose morphine as morphine can worsen intestinal ileus. -Encourage ambulation.  AKI on CKD 3B Anasarca Proteinuria Hypoalbuminemia -Baseline creatinine less than 2.  Creatinine has been worsening since  admission. -Also on admission, she was noted to have low albumin level, anasarca and proteinuria.   -Need to rule out nephrotic syndrome.  Obtain urine protein creatinine ratio and 24-hour urine protein. -She was started on IV Lasix 40 mg twice daily.  She got 3 doses so far.  Creatinine is rising now.  I will stop her Lasix at this time and will restart her on normal saline at 75 mill per hour. Recent Labs    01/26/21 2337 01/27/21 0302 01/27/21 0742 01/28/21 0310 01/31/21 0800 02/01/21 1520 02/02/21 0402 02/14/21 2130 02/15/21 0514 02/16/21 0434  BUN 34* 33* 30* 27* 38* 40* 43* 43* 49* 46*  CREATININE 2.00* 1.92* 1.73* 2.11* 2.09* 2.03* 2.20* 1.98* 2.10* 2.22*   Uncontrolled type 2 diabetes mellitus Hyperglycemia/hypoglycemia -A1c 12 on 01/26/2021.  Blood sugar level wildly fluctuating from over 400 down to 55 within a day. -Home meds include Lantus 5 units daily with sliding scale insulin.  Blood sugar level this morning was elevated to 279.  I decided to continue the home regimen for now. -Continue Accu-Cheks. -She may benefit from following up with an endocrinologist.  May qualify for an insulin pump. Recent Labs  Lab 02/15/21 2344 02/16/21 0245 02/16/21 0552 02/16/21 0800 02/16/21 1209  GLUCAP 55* 113* 213* 279* 397*   Anemia of chronic disease -Hemoglobin at baseline between 7 and 8.  7.1 today.  No evidence of active bleeding.  Continue to monitor. Recent Labs    02/01/21 1519 02/01/21 1520 02/02/21 0402 02/14/21 2130 02/15/21 0514 02/16/21 0926  HGB  --  7.5* 7.6* 7.4* 7.3*  7.1*  MCV  --  88.1 89.9 90.6 90.7 87.7  VITAMINB12 389  --   --   --   --   --   FOLATE 6.3  --   --   --   --   --   FERRITIN 121  --   --   --   --   --   TIBC 261  --   --   --   --   --   IRON 35  --   --   --   --   --    Sinus tachycardia -Heart rate has been running mostly over 100 for last 24 hours with stable blood pressure.  Probably related to pain.  Monitor.  Bacterial  vaginosis -Complain of vaginal discharge.  Clue cells present on wet mount prep.  I will start the patient on Flagyl 500 mg twice daily for a week.  Mobility: Encourage ambulation Code Status:   Code Status: Full Code  Nutritional status: Body mass index is 26.6 kg/m.     Diet:  Diet Order             Diet heart healthy/carb modified Room service appropriate? Yes; Fluid consistency: Thin  Diet effective now                  DVT prophylaxis:  SCDs Start: 02/15/21 0032   Antimicrobials: Flagyl twice daily Fluid: Normal saline at 75 mill per hour Consultants: None Family Communication: None at bedside  Status is: Inpatient  Remains inpatient appropriate because: Continues to have severe constipation.  Has anasarca, may have nephrotic syndrome.  Work-up in progress.  Dispo: The patient is from: Home              Anticipated d/c is to: Home in 2 to 3 days              Patient currently is not medically stable to d/c.   Difficult to place patient No     Infusions:   sodium chloride 75 mL/hr at 02/16/21 1218    Scheduled Meds:  amLODipine  10 mg Oral Daily   carvedilol  12.5 mg Oral BID WC   dextrose  2 Tube Oral STAT   hydrocortisone   Rectal BID   insulin aspart  0-15 Units Subcutaneous TID WC   insulin glargine-yfgn  5 Units Subcutaneous Daily   metroNIDAZOLE  500 mg Oral Q12H   pantoprazole  40 mg Oral Daily   senna-docusate  1 tablet Oral BID    Antimicrobials: Anti-infectives (From admission, onward)    Start     Dose/Rate Route Frequency Ordered Stop   02/16/21 1430  metroNIDAZOLE (FLAGYL) tablet 500 mg  Status:  Discontinued        500 mg Oral Every 12 hours 02/16/21 1330 02/16/21 1330   02/16/21 1430  metroNIDAZOLE (FLAGYL) tablet 500 mg        500 mg Oral Every 12 hours 02/16/21 1330 02/23/21 0959   02/15/21 1000  metroNIDAZOLE (FLAGYL) tablet 500 mg  Status:  Discontinued        500 mg Oral Every 12 hours 02/15/21 0421 02/15/21 1749   02/15/21  0230  metroNIDAZOLE (FLAGYL) IVPB 500 mg  Status:  Discontinued        500 mg 100 mL/hr over 60 Minutes Intravenous 2 times daily 02/15/21 0211 02/15/21 0421       PRN meds: acetaminophen **OR** acetaminophen, bisacodyl, gabapentin, morphine injection, ondansetron **OR**  ondansetron (ZOFRAN) IV, oxyCODONE-acetaminophen **AND** oxyCODONE, polyethylene glycol   Objective: Vitals:   02/16/21 0844 02/16/21 1246  BP: 124/89 122/87  Pulse: (!) 104 (!) 112  Resp:  20  Temp:  98.9 F (37.2 C)  SpO2:  99%    Intake/Output Summary (Last 24 hours) at 02/16/2021 1334 Last data filed at 02/16/2021 1225 Gross per 24 hour  Intake 330 ml  Output 700 ml  Net -370 ml   Filed Weights   02/15/21 0716 02/15/21 1434 02/16/21 0500  Weight: 60 kg 67.7 kg 70.3 kg   Weight change:  Body mass index is 26.6 kg/m.   Physical Exam: General exam: Pleasant, young African-American female. Skin: No rashes, lesions or ulcers. HEENT: Atraumatic, normocephalic, no obvious bleeding Lungs: Clear to auscultation bilaterally CVS: Tachycardic, no murmur GI/Abd soft, mild diffuse tenderness present, bowel sound present CNS: Alert, awake, oriented x3 Psychiatry: Mood appropriate Extremities: no calf tenderness  Data Review: I have personally reviewed the laboratory data and studies available.  Recent Labs  Lab 02/14/21 2130 02/15/21 0514 02/16/21 0926  WBC 12.5* 11.6* 10.7*  NEUTROABS 10.8*  --  8.7*  HGB 7.4* 7.3* 7.1*  HCT 23.1* 22.4* 21.3*  MCV 90.6 90.7 87.7  PLT 338 331 379   Recent Labs  Lab 02/14/21 2130 02/15/21 0514 02/16/21 0434  NA 136 134* 136  K 5.2* 5.0 4.8  CL 104 100 102  CO2 23 20* 25  GLUCOSE 405* 511* 168*  BUN 43* 49* 46*  CREATININE 1.98* 2.10* 2.22*  CALCIUM 8.5* 8.2* 8.4*    F/u labs ordered Unresulted Labs (From admission, onward)     Start     Ordered   02/17/21 [id number]  Basic metabolic panel  Daily,   R      02/16/21 0846   02/17/21 0500  CBC with  Differential/Platelet  Daily,   R      02/16/21 0846   02/17/21 0500  Magnesium  Tomorrow morning,   STAT        02/16/21 0846   02/17/21 0500  Phosphorus  Tomorrow morning,   R        02/16/21 0846   02/16/21 0848  Protein, urine, 24 hour  Once,   R        02/16/21 0847   02/16/21 0847  Protein / creatinine ratio, urine  Once,   R        02/16/21 0847   02/15/21 1750  Protein / creatinine ratio, urine  Once,   R        02/15/21 1749            Signed, Terrilee Croak, MD [facility] 02/16/2021

## 2021-02-16 NOTE — Progress Notes (Signed)
Pt was seen by the staff member taking the medicine from her purse and pt denied it. Charge aware and pt said her husband is going to pick her bag. Pt got upset of taking her bag and given to family member.pt agreed to add a telemonitor. MD aware.Will continue monitor the pt.

## 2021-02-17 ENCOUNTER — Inpatient Hospital Stay (HOSPITAL_COMMUNITY): Payer: Medicaid Other

## 2021-02-17 LAB — PROTEIN, URINE, 24 HOUR
Collection Interval-UPROT: 24 hours
Protein, 24H Urine: 5831 mg/d — ABNORMAL HIGH (ref 50–100)
Protein, Urine: 343 mg/dL
Urine Total Volume-UPROT: 1700 mL

## 2021-02-17 LAB — CBC WITH DIFFERENTIAL/PLATELET
Abs Immature Granulocytes: 0.07 10*3/uL (ref 0.00–0.07)
Basophils Absolute: 0 10*3/uL (ref 0.0–0.1)
Basophils Relative: 1 %
Eosinophils Absolute: 0.1 10*3/uL (ref 0.0–0.5)
Eosinophils Relative: 2 %
HCT: 20.8 % — ABNORMAL LOW (ref 36.0–46.0)
Hemoglobin: 6.9 g/dL — CL (ref 12.0–15.0)
Immature Granulocytes: 1 %
Lymphocytes Relative: 21 %
Lymphs Abs: 1.8 10*3/uL (ref 0.7–4.0)
MCH: 29.4 pg (ref 26.0–34.0)
MCHC: 33.2 g/dL (ref 30.0–36.0)
MCV: 88.5 fL (ref 80.0–100.0)
Monocytes Absolute: 0.5 10*3/uL (ref 0.1–1.0)
Monocytes Relative: 5 %
Neutro Abs: 6.4 10*3/uL (ref 1.7–7.7)
Neutrophils Relative %: 70 %
Platelets: 302 10*3/uL (ref 150–400)
RBC: 2.35 MIL/uL — ABNORMAL LOW (ref 3.87–5.11)
RDW: 13.2 % (ref 11.5–15.5)
WBC: 8.9 10*3/uL (ref 4.0–10.5)
nRBC: 0.2 % (ref 0.0–0.2)

## 2021-02-17 LAB — PREPARE RBC (CROSSMATCH)

## 2021-02-17 LAB — SODIUM, URINE, RANDOM: Sodium, Ur: 82 mmol/L

## 2021-02-17 LAB — GLUCOSE, CAPILLARY
Glucose-Capillary: 175 mg/dL — ABNORMAL HIGH (ref 70–99)
Glucose-Capillary: 259 mg/dL — ABNORMAL HIGH (ref 70–99)
Glucose-Capillary: 60 mg/dL — ABNORMAL LOW (ref 70–99)
Glucose-Capillary: 59 mg/dL — ABNORMAL LOW (ref 70–99)
Glucose-Capillary: 66 mg/dL — ABNORMAL LOW (ref 70–99)
Glucose-Capillary: 92 mg/dL (ref 70–99)
Glucose-Capillary: 78 mg/dL (ref 70–99)

## 2021-02-17 LAB — BASIC METABOLIC PANEL
Anion gap: 7 (ref 5–15)
BUN: 44 mg/dL — ABNORMAL HIGH (ref 6–20)
CO2: 24 mmol/L (ref 22–32)
Calcium: 7.7 mg/dL — ABNORMAL LOW (ref 8.9–10.3)
Chloride: 101 mmol/L (ref 98–111)
Creatinine, Ser: 2.26 mg/dL — ABNORMAL HIGH (ref 0.44–1.00)
GFR, Estimated: 29 mL/min — ABNORMAL LOW (ref >60)
Glucose, Bld: 160 mg/dL — ABNORMAL HIGH (ref 70–99)
Potassium: 4.5 mmol/L (ref 3.5–5.1)
Sodium: 132 mmol/L — ABNORMAL LOW (ref 135–145)

## 2021-02-17 LAB — HEMOGLOBIN AND HEMATOCRIT, BLOOD
HCT: 30 % — ABNORMAL LOW (ref 36.0–46.0)
Hemoglobin: 10 g/dL — ABNORMAL LOW (ref 12.0–15.0)

## 2021-02-17 LAB — PHOSPHORUS: Phosphorus: 4 mg/dL (ref 2.5–4.6)

## 2021-02-17 LAB — MAGNESIUM: Magnesium: 1.8 mg/dL (ref 1.7–2.4)

## 2021-02-17 LAB — CREATININE, URINE, RANDOM: Creatinine, Urine: 40.67 mg/dL

## 2021-02-17 MED ORDER — INSULIN ASPART 100 UNIT/ML IJ SOLN
2.0000 [IU] | Freq: Three times a day (TID) | INTRAMUSCULAR | Status: DC
  Administered 2021-02-18 – 2021-02-21 (×9): 2 [IU] via SUBCUTANEOUS

## 2021-02-17 MED ORDER — INSULIN ASPART 100 UNIT/ML IJ SOLN
0.0000 [IU] | Freq: Three times a day (TID) | INTRAMUSCULAR | Status: DC
  Administered 2021-02-18 – 2021-02-19 (×2): 1 [IU] via SUBCUTANEOUS
  Administered 2021-02-19 (×2): 7 [IU] via SUBCUTANEOUS
  Administered 2021-02-20: 2 [IU] via SUBCUTANEOUS
  Administered 2021-02-20: 1 [IU] via SUBCUTANEOUS
  Administered 2021-02-21 (×2): 2 [IU] via SUBCUTANEOUS
  Administered 2021-02-21: 3 [IU] via SUBCUTANEOUS

## 2021-02-17 MED ORDER — MORPHINE SULFATE (PF) 2 MG/ML IV SOLN
1.0000 mg | INTRAVENOUS | Status: AC | PRN
Start: 2021-02-17 — End: 2021-02-19
  Administered 2021-02-17 – 2021-02-19 (×8): 1 mg via INTRAVENOUS
  Filled 2021-02-17 (×8): qty 1

## 2021-02-17 MED ORDER — MORPHINE SULFATE (PF) 2 MG/ML IV SOLN
2.0000 mg | Freq: Once | INTRAVENOUS | Status: DC
Start: 2021-02-17 — End: 2021-02-22
  Filled 2021-02-17: qty 1

## 2021-02-17 MED ORDER — SODIUM CHLORIDE 0.9% IV SOLUTION: Freq: Once | INTRAVENOUS | Status: AC

## 2021-02-17 MED ORDER — INSULIN ASPART 100 UNIT/ML IJ SOLN: 0.0000 [IU] | Freq: Every day | INTRAMUSCULAR | Status: DC

## 2021-02-17 MED ORDER — FUROSEMIDE 10 MG/ML IJ SOLN
40.0000 mg | Freq: Two times a day (BID) | INTRAMUSCULAR | Status: DC
  Administered 2021-02-17 – 2021-02-18 (×2): 40 mg via INTRAVENOUS
  Filled 2021-02-17 (×2): qty 4

## 2021-02-17 NOTE — Progress Notes (Signed)
Pt was in pain and PRN Morphine IV was given. Foley was not done after pt voided 919m of urine, MD was notified.  Blood transfusion was given 1 unit, no allergic reaction was noted. Tele sitter was DC as well. Will continue to monitor pt.

## 2021-02-17 NOTE — Consult Note (Signed)
Renal Service Consult Note [facility]  Lynzee Lindquist 02/17/2021 Sol Blazing, MD Requesting Physician: Dr Pietro Cassis  Reason for Consult: Renal failure HPI: The patient is a 30 y.o. year-old w/ hx of HL, DM type 1, neuropathy presented 9/17 w/ constipation, abd pain for 1 week.  Also pitting edema in the legs. In ED BP 135/85, RR 18, WBC 12  creat 1.98, albumin 1.8. CT abd showed pleural effusions w/ basilar airspace disease. Pt rec'd IV lasix 20- then 40 mg. Creat 1.9 > 2.2 here. Asked to see for renal failure.   Pt seen in room, edema has been ongoing for several mos and is new. Has not been on any diuretics.  She has missed her last 2 CKD appts w/ her nephrologist Dr Hollie Salk. Main issues is swelling of the legs and arms and labial edema as well is bothering her. No SOB, cough or orthopnea.    Pt is getting here >>  po norvasc, coreg, lasix IV 20-29m, insulin, lacutlose, flagyl, Oxy IR prn, percocet dc'd, IV mso4 prn, protonix, laxatives, IVF"s 75/hr since yest (dc'd today)  Pt was admitted in jUne 2022 for 1 day for abdominal pain, high BS, high pulse rate, CKD f/b CKA. CT showed possible appendicitis, but clinically there was low suspicion. Repeat CT the next day showed normal appendix. Dc'd home.  Pt admitted aug 29- sept 5, 2022 for DKA, DM1, anasarca/ edema w/ nephrotic syndrome, CKD 3a. Mild AKI resolved w/ IVF. HTNsive urgency resolved.    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   Neuropathy    Preeclampsia    STD (sexually transmitted disease)    Past Surgical History  Past Surgical History:  Procedure Laterality Date   CESAREAN SECTION     CESAREAN SECTION N/A 07/05/2013   Procedure: CESAREAN SECTION;  Surgeon: MEmily Filbert MD;   [facility];  Service: Obstetrics;  Laterality: N/A;   CESAREAN SECTION N/A 07/25/2014   Procedure: CESAREAN SECTION;  Surgeon: TDonnamae Jude MD;  [facility] [facility];  Service: Obstetrics;  Laterality: N/A;   CESAREAN SECTION N/A 11/21/2015   Procedure: CESAREAN SECTION;  Surgeon: TDonnamae Jude MD;  [place] [facility]  Service: Obstetrics;  Laterality: N/A;   WISDOM TOOTH EXTRACTION     Family History  Family History  Problem Relation Age of Onset   Stroke Mother    Hypertension Mother    Heart disease Mother    Kidney disease Mother    Hyperlipidemia Father    Diabetes Father    Social History  reports that she has been smoking cigarettes. She has a 2.25 pack-year smoking history. She has never used smokeless tobacco. She reports current alcohol use. She reports that she does not use drugs. Allergies  Allergies  Allergen Reactions   Hydromorphone Hcl Itching and Rash   Home medications Prior to Admission medications   Medication Sig Start Date End Date Taking? Authorizing Provider  amLODipine (NORVASC) 10 MG tablet Take 1 tablet (10 mg total) by mouth daily. 02/03/21  Yes GDwyane Dee MD  carvedilol (COREG) 12.5 MG tablet Take 1 tablet (12.5 mg total) by mouth 2 (two) times daily with a meal. 02/02/21  Yes GDwyane Dee MD  furosemide (  LASIX) 20 MG tablet Take 20 mg by mouth 2 (two) times daily.   Yes [provider]  gabapentin (NEURONTIN) 600 MG tablet Take 600 mg by mouth 3 (three) times daily as needed (pain). 10/28/20  Yes [provider]  insulin glargine (LANTUS SOLOSTAR) 100 UNIT/ML Solostar Pen Inject 5 Units into the skin daily. And pen needles 1/day 02/02/21  Yes Dwyane Dee, MD  insulin lispro (HUMALOG) 100 UNIT/ML KwikPen Inject 2-12 Units into the skin in the morning, at noon, and at bedtime. Per sliding scale   Yes [provider]  omeprazole (PRILOSEC) 20 MG capsule Take 20 mg by mouth daily.   Yes [provider]   ondansetron (ZOFRAN) 4 MG tablet Take 1 tablet (4 mg total) by mouth every 8 (eight) hours as needed for nausea or vomiting. 06/10/20  Yes Renato Shin, MD  oxyCODONE-acetaminophen (PERCOCET) 10-325 MG tablet Take 1 tablet by mouth every 4 (four) hours as needed for pain. 02/13/21  Yes [provider]  pantoprazole (PROTONIX) 40 MG tablet Take 1 tablet (40 mg total) by mouth daily. 02/03/21  Yes Dwyane Dee, MD  Vitamin D, Ergocalciferol, (DRISDOL) 1.25 MG (50000 UNIT) CAPS capsule Take 50,000 Units by mouth every Wednesday.   Yes [provider]  Continuous Blood Gluc Receiver (FREESTYLE LIBRE 2 READER) DEVI 1 each by Does not apply route in the morning, at noon, in the evening, and at bedtime. 10/31/20   Brimage, Ronnette Juniper, DO  enalapril (VASOTEC) 5 MG tablet Take by mouth. 02/14/21   [provider]  Insulin Pen Needle (PEN NEEDLES) 30G X 8 MM MISC 1 each by Does not apply route daily. E11.9 05/14/19   Renato Shin, MD     Vitals:   02/17/21 9977 02/17/21 0843 02/17/21 1145 02/17/21 1202  BP: 104/80 124/82 (!) 127/91 131/89  Pulse: (!) 101 (!) 103 (!) 104 (!) 106  Resp: _0 Temp: 98.7 F (37.1 C) 97.9 F (36.6 C) 98.1 F (36.7 C) 98.8 F (37.1 C)  TempSrc: Oral Oral Oral Oral  SpO2: (!) 86%  97% 98%  Weight:      Height:       Exam Gen alert, no distress, frail and chron ill appearing No rash, cyanosis or gangrene Sclera anicteric, throat clear  No jvd or bruits Chest clear bilat to bases, no rales/ wheezing RRR no MRG Abd soft ntnd no mass or ascites +bs GU normal MS no joint effusions or deformity Ext diffuse pitting edema of LE's and UE's 2+,  no wounds or ulcers Neuro is alert, Ox 3 , nf         Date   Creat  eGFR   2012- 2017   0.66- 1.12   2020   1.30 Jun 2020  1.24      April- June 2022 1.41- 2.03 33- 51, stage 3b   August 2022  2.35 >> 1.73 AKI episode   Sept 9/3- 9/5  2.03- 2.20 30-32 ml/min, stage 3b    Sept  17  1.98  34    Sept 18  2.10    Sept 19  2.22    Sept 20  2.26  29    Home meds include - norvasc, coreg, lasix 20 bid, neurontin, lantus 5u qd, lispro tid ssi, prilosec, percocet prn, protonix, vasotec 68m qd, prn's   UA 9/18  - cloudy, 21-50 wbc, 11-20 rbc, many bact, >300 prot   Last UPC ratio in April  2022 = 11.4 gm    CT abd > Adrenals/Urinary Tract: Adrenal glands are unremarkable. Kidneys are normal, without renal calculi, focal lesion, or hydronephrosis. Bladder is unremarkable.     CT abd / pelv - IMPRESSION: 1. Bilateral pleural effusions with airspace disease in the lung bases, possibly pneumonia or edema. 2. Diffuse edema throughout the subcutaneous fatty tissues. 3. Moderate free fluid throughout the abdomen and pelvis, likely ascites. Effusions and edematous changes are new since the previous study.      Na 132  BUN 44  cr 2.26  CO2 24  K 4.5  WBC 8K Hb 7.1      24 hr urine protein 02/16/21 done yesterday  = 5.8 gms    BP 's wnl 105- 125/ 80-90   RR 16- 24   HR 106   temp afeb    Assessment/ Plan: AKI on CKD 3b - b/l creat 1.4- 2.0 from mid 2022, eGFR 33- 51. +nephrotic syndrome from type 1 DM.  Had 11 gm in April and 24 hr urine here shows 5.8gm/ 24 hrs. Was just recently started on acei (vasotec). Has missed last 2 appts w/ her renal MD.  Sig volume overload/ anasarca. Plan IV lasix 40 bid for now, titrate up as needed. Will plan torsemide po upon dc when edema is under better control. Hold acei during diuresis given creat bump already.   Constipation - admitting diagnosis, per pmd DM1 - on insulin, A1c 12 most recently Anemia - b/l Hb 7- 8 , down today and to get 1u prbc's HTN - getting coreg 12.5 bid, norvasc on hold d/t edema      Kelly Splinter  MD 02/17/2021, 12:56 PM  Recent Labs  Lab 02/16/21 0926 02/17/21 0517  WBC 10.7* 8.9  HGB 7.1* 6.9*   Recent Labs  Lab 02/16/21 0434 02/17/21 0517  K 4.8 4.5  BUN 46* 44*  CREATININE 2.22* 2.26*  CALCIUM 8.4* 7.7*   PHOS  --  4.0

## 2021-02-17 NOTE — Progress Notes (Signed)
Pt was offered pain medication before foley insertion but pt refused as she was able to void independently. MD was notified. She was hypoglycemic for dinner, snacks was offered. Will monitor her blood sugar. Pt was asymptomatic. Last BS was 66. MD was notified.

## 2021-02-17 NOTE — Plan of Care (Signed)

## 2021-02-17 NOTE — Progress Notes (Signed)
Inpatient Diabetes Program Recommendations  AACE/ADA: New Consensus Statement on Inpatient Glycemic Control (2015)  Target Ranges:  Prepandial:   less than 140 mg/dL      Peak postprandial:   less than 180 mg/dL (1-2 hours)      Critically ill patients:  140 - 180 mg/dL   Lab Results  Component Value Date   GLUCAP 259 (H) 02/17/2021   HGBA1C 12.0 (H) 01/26/2021    Review of Glycemic Control  Diabetes history: DM1 Outpatient Diabetes medications: Lantus 5 units QD, Humalog 2-12 units TID Current orders for Inpatient glycemic control: Semglee 5 QD, Novolog 0-15 units TID with meals  Inpatient Diabetes Program Recommendations:    Decrease Novolog to 0-9 units TID (Type 1 and sensitive to insulin) Add Novolog 2 units TID with meals if eating > 50% meal  Continue to follow.  Thank you. Lorenda Peck, RD, LDN, CDE Inpatient Diabetes Coordinator [phone]

## 2021-02-17 NOTE — Progress Notes (Signed)
Lab called to report a critical lab value --Hgb = 6.9. Secure chat to on call provider for [place]  1 UPRBCs ordered

## 2021-02-17 NOTE — Progress Notes (Signed)
PROGRESS NOTE  Ann Robertson  DOB: September 08, 1990  PCP: [facility] [place] [id number]  [place]: 02/14/2021  LOS: 1 day  Hospital Day: 4   Chief Complaint  Patient presents with   Constipation    Brief narrative: Ann Robertson is a 30 y.o. female with PMH significant for 30 year old female PMH of DM1, HLD, diabetic neuropathy. Patient presented to the ED on 9/17 with complaint of abdominal pain, constipation despite multiple laxatives at home.    In the ED, she was hemodynamically stable.   CT abdomen and pelvis showed scattered stool throughout the colon without evidence of bowel obstruction or inflammation.  She also had moderate free fluid throughout the abdomen pelvis likely ascites.  She had diffuse edema throughout the subcutaneous fatty tissue as well as bilateral pleural effusion. Urinalysis showed more than 300 proteins. Admitted to hospitalist service. See below for details  Subjective: Patient was seen and examined this morning. Lying on bed.  Complains of pain in the abdomen especially pain in her groin when she tries to pass urine or stool.  Reports that she had chunks of hard stool last night with a lot of pain.  Her fianc helped her pull those out. I did an examination of her perineum with RN Pamala Hurry at bedside.  She has swelling of labia majora on both sides.  I do not see any evidence of anal fissure or hemorrhoids or any wound or abscess  Assessment/Plan: Severe constipation -Presented with abdominal pain, constipation for several days not responding to laxatives at home.  -She has had few bowel movements with hard stool after Dulcolax was given yesterday.  She states that it was very painful.  -We will put her on scheduled stool softeners and MiraLAX daily. -Patient reports inadequate pain control with oral pain medicines.  She is specifically asking for morphine.  We discussed about poor clearance of more pain in case of acute kidney injury.  She is allergic  to Dilaudid.  I would only do low-dose morphine as morphine can worsen intestinal ileus and renal failure. -Encourage ambulation.  AKI on CKD 3B Rule out nephrotic syndrome - Anasarca, Proteinuria, Hypoalbuminemia -Baseline creatinine less than 2.  Creatinine has been worsening since admission. -Also on admission, she was noted to have low albumin level, anasarca and proteinuria.   -Need to rule out nephrotic syndrome.  I ordered for urine protein creatinine ratio and 24-hour urine protein yesterday. -On admission, she was started on IV Lasix 40 mg twice daily.  She got 3 doses of IV Lasix.  Because of worsening creatinine at 2.22, I stopped it yesterday morning and started on normal saline.  Creatinine slightly up again today to 2.26 with worsening swelling, I will stop IV fluid at this time.  -Nephrology consultation called. Recent Labs    01/27/21 0302 01/27/21 0742 01/28/21 0310 01/31/21 0800 02/01/21 1520 02/02/21 0402 02/14/21 2130 02/15/21 0514 02/16/21 0434 02/17/21 0517  BUN 33* 30* 27* 38* 40* 43* 43* 49* 46* 44*  CREATININE 1.92* 1.73* 2.11* 2.09* 2.03* 2.20* 1.98* 2.10* 2.22* 2.26*   Pain in the groin -Patient reports pain in the groin especially when she tries to pass urine or stool.  Reports that she had chunks of hard stool last night with a lot of pain.  Her fianc helped her pull those out. -This morning, I did an examination of her perineum with RN Pamala Hurry at bedside.  She has swelling of labia majora on both sides.  I do not see any evidence of  anal fissure or hemorrhoids or any wound or abscess. -I would put in a Foley catheter which will help to prevent urinary retention and also help monitor output. -Anusol cream ordered as well.  Uncontrolled type 2 diabetes mellitus Hyperglycemia/hypoglycemia -A1c 12 on 01/26/2021.  Blood sugar level wildly fluctuating.   -Currently on 5 units Lantus and sliding scale insulin.  Patient does not want comply with carb controlled  diet.  She wants to be in regular diet -Continue Accu-Cheks. -She may benefit from following up with an endocrinologist as an outpatient.  May qualify for an insulin pump. Recent Labs  Lab 02/16/21 1209 02/16/21 1644 02/16/21 2131 02/17/21 0735 02/17/21 1111  GLUCAP 397* 240* 94 175* 259*   Acute anemia  Anemia of chronic disease -Hemoglobin at baseline between 7 and 8.  Gradually dropping, 6.9 today.  Probably dilutional, no evidence of active blood loss.  Transfuse 1 unit PRBC today. Recent Labs    02/01/21 1519 02/01/21 1520 02/02/21 0402 02/14/21 2130 02/15/21 0514 02/16/21 0926 02/17/21 0517  HGB  --    < > 7.6* 7.4* 7.3* 7.1* 6.9*  MCV  --    < > 89.9 90.6 90.7 87.7 88.5  VITAMINB12 389  --   --   --   --   --   --   FOLATE 6.3  --   --   --   --   --   --   FERRITIN 121  --   --   --   --   --   --   TIBC 261  --   --   --   --   --   --   IRON 35  --   --   --   --   --   --    < > = values in this interval not displayed.   Sinus tachycardia Essential hypertension -Heart rate has been running mostly over 100 for last 48 hours with stable blood pressure.  Probably related to pain.  Blood pressure is stable on Coreg 12.5 mg twice daily and amlodipine 10 mg daily.  I do not think her swelling is related to amlodipine.  But I will stop it at this time to make some room to increase the dose of Coreg which would help with both heart rate and blood pressure.  Bacterial vaginosis -Complain of vaginal discharge.  Clue cells present on wet mount prep.  She has been initiated on Flagyl 500 mg twice daily for a week.  Mobility: Encourage ambulation Code Status:   Code Status: Full Code  Nutritional status: Body mass index is 29.9 kg/m.     Diet:  Diet Order             Diet regular Room service appropriate? Yes; Fluid consistency: Thin  Diet effective now                  DVT prophylaxis:  SCDs Start: 02/15/21 0032   Antimicrobials: Flagyl twice daily Fluid:  Stop IV fluid today Consultants: Nephrology Family Communication: None at bedside  Status is: Inpatient  Remains inpatient appropriate because: Continues to have severe constipation.  Has anasarca, may have nephrotic syndrome.  Work-up in progress.  Dispo: The patient is from: Home              Anticipated d/c is to: Home in 2 to 3 days              Patient currently  is not medically stable to d/c.   Difficult to place patient No     Infusions:     Scheduled Meds:  sodium chloride   Intravenous Once   carvedilol  12.5 mg Oral BID WC   hydrocortisone   Rectal BID   insulin aspart  0-15 Units Subcutaneous TID WC   insulin glargine-yfgn  5 Units Subcutaneous Daily   metroNIDAZOLE  500 mg Oral Q12H   pantoprazole  40 mg Oral Daily   senna-docusate  1 tablet Oral BID    Antimicrobials: Anti-infectives (From admission, onward)    Start     Dose/Rate Route Frequency Ordered Stop   02/16/21 1430  metroNIDAZOLE (FLAGYL) tablet 500 mg  Status:  Discontinued        500 mg Oral Every 12 hours 02/16/21 1330 02/16/21 1330   02/16/21 1430  metroNIDAZOLE (FLAGYL) tablet 500 mg        500 mg Oral Every 12 hours 02/16/21 1330 02/23/21 0959   02/15/21 1000  metroNIDAZOLE (FLAGYL) tablet 500 mg  Status:  Discontinued        500 mg Oral Every 12 hours 02/15/21 0421 02/15/21 1749   02/15/21 0230  metroNIDAZOLE (FLAGYL) IVPB 500 mg  Status:  Discontinued        500 mg 100 mL/hr over 60 Minutes Intravenous 2 times daily 02/15/21 0211 02/15/21 0421       PRN meds: acetaminophen **OR** acetaminophen, bisacodyl, gabapentin, morphine injection, ondansetron **OR** ondansetron (ZOFRAN) IV, oxyCODONE-acetaminophen **AND** oxyCODONE, polyethylene glycol   Objective: Vitals:   02/17/21 0517 02/17/21 0843  BP: 104/80 124/82  Pulse: (!) 101 (!) 103  Resp: 20 18  Temp: 98.7 F (37.1 C) 97.9 F (36.6 C)  SpO2: (!) 86%     Intake/Output Summary (Last 24 hours) at 02/17/2021 1132 Last data  filed at 02/17/2021 1124 Gross per 24 hour  Intake 542.41 ml  Output 1650 ml  Net -1107.59 ml   Filed Weights   02/15/21 1434 02/16/21 0500 02/17/21 0514  Weight: 67.7 kg 70.3 kg 79 kg   Weight change: 19 kg Body mass index is 29.9 kg/m.   Physical Exam: General exam: Pleasant, young African-American female. Skin: No rashes, lesions or ulcers. HEENT: Atraumatic, normocephalic, no obvious bleeding Lungs: Clear to auscultation bilaterally CVS: Tachycardic, no murmur GI/Abd soft, mild diffuse tenderness present, bowel sound present Groin: Swollen labia majora both sides.  No evidence of cellulitis, abscess or an open wound.  No evidence of hemorrhoids or anal fissure CNS: Alert, awake, oriented x3 Psychiatry: Tearful Extremities: no calf tenderness.  1+ bilateral pedal edema  Data Review: I have personally reviewed the laboratory data and studies available.  Recent Labs  Lab 02/14/21 2130 02/15/21 0514 02/16/21 0926 02/17/21 0517  WBC 12.5* 11.6* 10.7* 8.9  NEUTROABS 10.8*  --  8.7* 6.4  HGB 7.4* 7.3* 7.1* 6.9*  HCT 23.1* 22.4* 21.3* 20.8*  MCV 90.6 90.7 87.7 88.5  PLT 338 331 379 302   Recent Labs  Lab 02/14/21 2130 02/15/21 0514 02/16/21 0434 02/17/21 0517  NA 136 134* 136 132*  K 5.2* 5.0 4.8 4.5  CL 104 100 102 101  CO2 23 20* 25 24  GLUCOSE 405* 511* 168* 160*  BUN 43* 49* 46* 44*  CREATININE 1.98* 2.10* 2.22* 2.26*  CALCIUM 8.5* 8.2* 8.4* 7.7*  MG  --   --   --  1.8  PHOS  --   --   --  4.0    F/u  labs ordered Unresulted Labs (From admission, onward)     Start     Ordered   02/17/21 [id number]  Basic metabolic panel  Daily,   R      02/16/21 0846   02/17/21 0500  CBC with Differential/Platelet  Daily,   R      02/16/21 0846   02/16/21 0848  Protein, urine, 24 hour  Once,   R        02/16/21 0847   02/16/21 0847  Protein / creatinine ratio, urine  Once,   R        02/16/21 0847            Signed, Terrilee Croak, MD [facility] 02/17/2021

## 2021-02-18 LAB — CBC WITH DIFFERENTIAL/PLATELET
Abs Immature Granulocytes: 0.03 10*3/uL (ref 0.00–0.07)
Basophils Absolute: 0 10*3/uL (ref 0.0–0.1)
Basophils Relative: 0 %
Eosinophils Absolute: 0.2 10*3/uL (ref 0.0–0.5)
Eosinophils Relative: 2 %
HCT: 27 % — ABNORMAL LOW (ref 36.0–46.0)
Hemoglobin: 9.1 g/dL — ABNORMAL LOW (ref 12.0–15.0)
Immature Granulocytes: 0 %
Lymphocytes Relative: 23 %
Lymphs Abs: 1.6 10*3/uL (ref 0.7–4.0)
MCH: 30.1 pg (ref 26.0–34.0)
MCHC: 33.7 g/dL (ref 30.0–36.0)
MCV: 89.4 fL (ref 80.0–100.0)
Monocytes Absolute: 0.4 10*3/uL (ref 0.1–1.0)
Monocytes Relative: 6 %
Neutro Abs: 4.5 10*3/uL (ref 1.7–7.7)
Neutrophils Relative %: 69 %
Platelets: 316 10*3/uL (ref 150–400)
RBC: 3.02 MIL/uL — ABNORMAL LOW (ref 3.87–5.11)
RDW: 13.6 % (ref 11.5–15.5)
WBC: 6.7 10*3/uL (ref 4.0–10.5)
nRBC: 0 % (ref 0.0–0.2)

## 2021-02-18 LAB — BASIC METABOLIC PANEL
Anion gap: 6 (ref 5–15)
BUN: 42 mg/dL — ABNORMAL HIGH (ref 6–20)
CO2: 26 mmol/L (ref 22–32)
Calcium: 7.7 mg/dL — ABNORMAL LOW (ref 8.9–10.3)
Chloride: 103 mmol/L (ref 98–111)
Creatinine, Ser: 2.32 mg/dL — ABNORMAL HIGH (ref 0.44–1.00)
GFR, Estimated: 28 mL/min — ABNORMAL LOW (ref >60)
Glucose, Bld: 117 mg/dL — ABNORMAL HIGH (ref 70–99)
Potassium: 4.6 mmol/L (ref 3.5–5.1)
Sodium: 135 mmol/L (ref 135–145)

## 2021-02-18 LAB — TYPE AND SCREEN
ABO/RH(D): O POS
Antibody Screen: NEGATIVE
Unit division: 0

## 2021-02-18 LAB — BPAM RBC
Blood Product Expiration Date: 202210212359
ISSUE DATE / TIME: 202209201138
Unit Type and Rh: 5100

## 2021-02-18 LAB — GLUCOSE, CAPILLARY
Glucose-Capillary: 127 mg/dL — ABNORMAL HIGH (ref 70–99)
Glucose-Capillary: 94 mg/dL (ref 70–99)
Glucose-Capillary: 87 mg/dL (ref 70–99)
Glucose-Capillary: 131 mg/dL — ABNORMAL HIGH (ref 70–99)

## 2021-02-18 MED ORDER — CYCLOBENZAPRINE HCL 5 MG PO TABS
5.0000 mg | ORAL_TABLET | Freq: Once | ORAL | Status: AC
  Administered 2021-02-18: 5 mg via ORAL
  Filled 2021-02-18: qty 1

## 2021-02-18 MED ORDER — CHLORHEXIDINE GLUCONATE CLOTH 2 % EX PADS
6.0000 | MEDICATED_PAD | Freq: Every day | CUTANEOUS | Status: DC
  Administered 2021-02-18 – 2021-02-21 (×4): 6 via TOPICAL

## 2021-02-18 MED ORDER — FUROSEMIDE 10 MG/ML IJ SOLN
60.0000 mg | Freq: Two times a day (BID) | INTRAMUSCULAR | Status: DC
  Administered 2021-02-18 – 2021-02-20 (×4): 60 mg via INTRAVENOUS
  Filled 2021-02-18 (×4): qty 6

## 2021-02-18 NOTE — Progress Notes (Addendum)
Pt has been complaining of constant burning abdominal pain and vaginal pain worsen afte rthe foley was inserted last night. PRN morphine and Percocet/Oxy were given with no relief. MD was notified. Heat pad and pack provided, provided some comfort.

## 2021-02-18 NOTE — Progress Notes (Signed)
PROGRESS NOTE  Ann Robertson  DOB: [birth year]  PCP: [facility] [id number]  [place]: 02/14/2021  LOS: 2 days  Hospital Day: 5   Chief Complaint  Patient presents with   Constipation    Brief narrative: Ann Robertson is a 30 y.o. female with PMH significant for 30 year old female PMH of DM1, HLD, diabetic neuropathy. Patient presented to the ED on 9/17 with complaint of abdominal pain, constipation despite multiple laxatives at home.    In the ED, she was hemodynamically stable.   CT abdomen and pelvis showed scattered stool throughout the colon without evidence of bowel obstruction or inflammation.  She also had moderate free fluid throughout the abdomen pelvis likely ascites.  She had diffuse edema throughout the subcutaneous fatty tissue as well as bilateral pleural effusion. Urinalysis showed more than 300 proteins. Admitted to hospitalist service. See below for details  Subjective: Patient was seen and examined this morning. Edema improving with IV Lasix.  She continues to have pain in her groin due to swelling of the labia majora.  On examination, I do not see any evidence of cellulitis or abscess.  Assessment/Plan: AKI on CKD 3B Rule out nephrotic syndrome - Anasarca, Proteinuria, Hypoalbuminemia -Baseline creatinine less than 2.  Creatinine has been worsening since admission. -Also on admission, she was noted to have low albumin level, anasarca and proteinuria.   -Nephrology consulted.  Work-up in progress. -Currently Lasix IV 40 mg twice daily.  Noted that he has been increased to 60 mg twice daily today.  Continue to monitor urine output. Recent Labs    01/27/21 0742 01/28/21 0310 01/31/21 0800 02/01/21 1520 02/02/21 0402 02/14/21 2130 02/15/21 0514 02/16/21 0434 02/17/21 0517 02/18/21 0446  BUN 30* 27* 38* 40* 43* 43* 49* 46* 44* 42*  CREATININE 1.73* 2.11* 2.09* 2.03* 2.20* 1.98* 2.10* 2.22* 2.26* 2.32*    Pain in the groin Acute urinary  retention -Patient reports pain in the groin especially when she tries to pass urine or stool.  Foley catheter has been inserted because she was retaining close to 1000 mL urine twice yesterday.   -On examination I do not see any evidence of cellulitis or abscess.  CT abdomen and pelvis from 9/17 did not show any evidence of inflammation in the perineal area.  No fever, WBC count normal.  If she continues to have pain, may need to obtain imaging to rule out underlying infection/abscess.    Uncontrolled type 2 diabetes mellitus Hyperglycemia/hypoglycemia -A1c 12 on 01/26/2021.  Blood sugar level wildly fluctuating.   -Currently on 5 units Lantus and sliding scale insulin.  Patient does not want comply with carb controlled diet.  She wants to be in regular diet -Continue Accu-Cheks. -She may benefit from following up with an endocrinologist as an outpatient.  May qualify for an insulin pump. Recent Labs  Lab 02/17/21 1818 02/17/21 1904 02/17/21 2054 02/18/21 0714 02/18/21 1155  GLUCAP 66* 92 78 127* 94    Acute anemia  Anemia of chronic disease -Hemoglobin at baseline between 7 and 8.  No active bleeding but hemoglobin dropped to 6.9 on 9/20.  1 unit of PRBC was given.  Hemoglobin is at 9.1 today.  Continue to monitor Recent Labs    02/01/21 1519 02/01/21 1520 02/15/21 0514 02/16/21 0926 02/17/21 0517 02/17/21 1630 02/18/21 0446  HGB  --    < > 7.3* 7.1* 6.9* 10.0* 9.1*  MCV  --    < > 90.7 87.7 88.5  --  89.4  VITAMINB12  389  --   --   --   --   --   --   FOLATE 6.3  --   --   --   --   --   --   FERRITIN 121  --   --   --   --   --   --   TIBC 261  --   --   --   --   --   --   IRON 35  --   --   --   --   --   --    < > = values in this interval not displayed.    Sinus tachycardia Essential hypertension -Heart rate over 100.  Blood pressure controlled on Coreg and IV Lasix.  Severe constipation -Presented with abdominal pain, constipation for several days not responding  to laxatives at home.  -In hospital, she is getting regular bowel regimen.  Has had few episodes of hard stool.  Continue regular bowel regimen -Encourage ambulation.  Bacterial vaginosis -Complain of vaginal discharge.  Clue cells present on wet mount prep.  She has been initiated on Flagyl 500 mg twice daily for a week.  Mobility: Encourage ambulation Code Status:   Code Status: Full Code  Nutritional status: Body mass index is 28.08 kg/m.     Diet:  Diet Order             Diet regular Room service appropriate? Yes; Fluid consistency: Thin  Diet effective now                  DVT prophylaxis:  SCDs Start: 02/15/21 0032   Antimicrobials: Flagyl twice daily Fluid: Not on IV fluid Consultants: Nephrology Family Communication: None at bedside  Status is: Inpatient  Remains inpatient appropriate because: Work-up in progress for nephrotic syndrome.  Dispo: The patient is from: Home              Anticipated d/c is to: Home in 2 to 3 days              Patient currently is not medically stable to d/c.   Difficult to place patient No     Infusions:     Scheduled Meds:  carvedilol  12.5 mg Oral BID WC   Chlorhexidine Gluconate Cloth  6 each Topical Daily   furosemide  60 mg Intravenous BID   hydrocortisone   Rectal BID   insulin aspart  0-5 Units Subcutaneous QHS   insulin aspart  0-9 Units Subcutaneous TID WC   insulin aspart  2 Units Subcutaneous TID WC   insulin glargine-yfgn  5 Units Subcutaneous Daily   metroNIDAZOLE  500 mg Oral Q12H    morphine injection  2 mg Intravenous Once   pantoprazole  40 mg Oral Daily   senna-docusate  1 tablet Oral BID    Antimicrobials: Anti-infectives (From admission, onward)    Start     Dose/Rate Route Frequency Ordered Stop   02/16/21 1430  metroNIDAZOLE (FLAGYL) tablet 500 mg  Status:  Discontinued        500 mg Oral Every 12 hours 02/16/21 1330 02/16/21 1330   02/16/21 1430  metroNIDAZOLE (FLAGYL) tablet 500 mg         500 mg Oral Every 12 hours 02/16/21 1330 02/23/21 0959   02/15/21 1000  metroNIDAZOLE (FLAGYL) tablet 500 mg  Status:  Discontinued        500 mg Oral Every 12 hours 02/15/21 0421 02/15/21  1749   02/15/21 0230  metroNIDAZOLE (FLAGYL) IVPB 500 mg  Status:  Discontinued        500 mg 100 mL/hr over 60 Minutes Intravenous 2 times daily 02/15/21 0211 02/15/21 0421       PRN meds: acetaminophen **OR** acetaminophen, bisacodyl, gabapentin, morphine injection, ondansetron **OR** ondansetron (ZOFRAN) IV, oxyCODONE-acetaminophen **AND** oxyCODONE, polyethylene glycol   Objective: Vitals:   02/18/21 0723 02/18/21 1434  BP: (!) 140/97 (!) 138/93  Pulse: (!) 110 (!) 104  Resp:    Temp:  98.6 F (37 C)  SpO2: 96% 95%    Intake/Output Summary (Last 24 hours) at 02/18/2021 1508 Last data filed at 02/18/2021 1500 Gross per 24 hour  Intake 900 ml  Output 1600 ml  Net -700 ml    Filed Weights   02/16/21 0500 02/17/21 0514 02/18/21 0459  Weight: 70.3 kg 79 kg 74.2 kg   Weight change: -4.8 kg Body mass index is 28.08 kg/m.   Physical Exam: General exam: Pleasant, young African-American female. Skin: No rashes, lesions or ulcers. HEENT: Atraumatic, normocephalic, no obvious bleeding Lungs: Clear to auscultation bilaterally CVS: Tachycardic, no murmur GI/Abd soft, mild diffuse tenderness present, bowel sound present Groin: Continues to have swollen labia majora both sides.  No evidence of cellulitis, abscess or an open wound.  No evidence of hemorrhoids or anal fissure CNS: Alert, awake, oriented x3 Psychiatry: Tearful at times because of perineal pain. Extremities: no calf tenderness.  1+ bilateral pedal edema  Data Review: I have personally reviewed the laboratory data and studies available.  Recent Labs  Lab 02/14/21 2130 02/15/21 0514 02/16/21 0926 02/17/21 0517 02/17/21 1630 02/18/21 0446  WBC 12.5* 11.6* 10.7* 8.9  --  6.7  NEUTROABS 10.8*  --  8.7* 6.4  --  4.5  HGB  7.4* 7.3* 7.1* 6.9* 10.0* 9.1*  HCT 23.1* 22.4* 21.3* 20.8* 30.0* 27.0*  MCV 90.6 90.7 87.7 88.5  --  89.4  PLT 338 331 379 302  --  316    Recent Labs  Lab 02/14/21 2130 02/15/21 0514 02/16/21 0434 02/17/21 0517 02/18/21 0446  NA 136 134* 136 132* 135  K 5.2* 5.0 4.8 4.5 4.6  CL 104 100 102 101 103  CO2 23 20* '25 24 26  '$ GLUCOSE 405* 511* 168* 160* 117*  BUN 43* 49* 46* 44* 42*  CREATININE 1.98* 2.10* 2.22* 2.26* 2.32*  CALCIUM 8.5* 8.2* 8.4* 7.7* 7.7*  MG  --   --   --  1.8  --   PHOS  --   --   --  4.0  --      F/u labs ordered Unresulted Labs (From admission, onward)     Start     Ordered   02/17/21 [id number]  Basic metabolic panel  Daily,   R      02/16/21 0846   02/17/21 0500  CBC with Differential/Platelet  Daily,   R      02/16/21 0846            Signed, Terrilee Croak, MD [facility] 02/18/2021

## 2021-02-18 NOTE — Progress Notes (Signed)
[facility] Progress Note  Subjective: UOP 1.6 L yesterday, foley cath placed  Vitals:   02/18/21 0145 02/18/21 0329 02/18/21 0459 02/18/21 0723  BP: 128/89 137/87  (!) 140/97  Pulse: (!) 103 (!) 106  (!) 110  Resp: 16 16    Temp:  99.4 F (37.4 C)    TempSrc:  Oral    SpO2: 97% 94%  96%  Weight:   74.2 kg   Height:        Exam:  alert, nad frail and chronically ill appearing  no jvd  Chest cta bilat  Cor reg no RG  Abd soft ntnd no ascites   Ext diffuse 2+ pitting LE/ UE edema   Alert, NF, ox3        Date                           Creat               eGFR   2012- 2017                0.66- 1.12   2020                          1.30 Jun 2020                   1.24                                April- June 2022        1.41- 2.03        33- 51, stage 3b   August 2022              2.35 >> 1.73    AKI episode   Sept 9/3- 9/5             2.03- 2.20        30-32 ml/min, stage 3b              Sept 17                      1.98                 34    Sept 18                     2.10    Sept 19                     2.22    Sept 20                     2.26                 29      Home meds include - norvasc, coreg, lasix 20 bid, neurontin, lantus 5u qd, lispro tid ssi, prilosec, percocet prn, protonix, vasotec 14m qd, prn's    UA 9/18  - cloudy, 21-50 wbc, 11-20 rbc, many bact, >300 prot   Last UPC ratio in April 2022 = 11.4 gm    CT abd > Adrenals/Urinary Tract: Adrenal glands are unremarkable. Kidneys are normal, without renal calculi, focal lesion, or hydronephrosis. Bladder is unremarkable.     CT abd / pelv - IMPRESSION: 1. Bilateral  pleural effusions with airspace disease in the lung bases, possibly pneumonia or edema. 2. Diffuse edema throughout the subcutaneous fatty tissues. 3. Moderate free fluid throughout the abdomen and pelvis, likely ascites. Effusions and edematous changes are new since the previous study.     24 hr urine protein 02/16/21  = 5.8 gms        Assessment/ Plan: AKI on CKD 3b - b/l creat 1.4- 2.0 from mid 2022, eGFR 33- 51. +nephrotic syndrome from type 1 DM.  Had 11 gm protein in April and 5.8gm here on a 24hr urine. Has missed last 2 appts w/ her renal MD Dr Hollie Salk.  Sig volume overload/ anasarca for several mos now. Pt admitted for diuresis. Continue IV lasix, ^ to 60 bid. Will plan torsemide po upon dc. Holding acei during diuresis given creat bump. Will follow.  Constipation - admitting diagnosis, per pmd DM1 - on insulin, A1c 12 most recently Anemia - Hb 6- 8 here, sp 1u prbc's HTN - getting coreg 12.5 bid, norvasc on hold d/t edema    Ann Robertson 02/18/2021, 11:20 AM   Recent Labs  Lab 02/17/21 0517 02/17/21 1630 02/18/21 0446  K 4.5  --  4.6  BUN 44*  --  42*  CREATININE 2.26*  --  2.32*  CALCIUM 7.7*  --  7.7*  PHOS 4.0  --   --   HGB 6.9* 10.0* 9.1*   Inpatient medications:  carvedilol  12.5 mg Oral BID WC   Chlorhexidine Gluconate Cloth  6 each Topical Daily   furosemide  40 mg Intravenous Q12H   hydrocortisone   Rectal BID   insulin aspart  0-5 Units Subcutaneous QHS   insulin aspart  0-9 Units Subcutaneous TID WC   insulin aspart  2 Units Subcutaneous TID WC   insulin glargine-yfgn  5 Units Subcutaneous Daily   metroNIDAZOLE  500 mg Oral Q12H    morphine injection  2 mg Intravenous Once   pantoprazole  40 mg Oral Daily   senna-docusate  1 tablet Oral BID    acetaminophen **OR** acetaminophen, bisacodyl, gabapentin, morphine injection, ondansetron **OR** ondansetron (ZOFRAN) IV, oxyCODONE-acetaminophen **AND** oxyCODONE, polyethylene glycol

## 2021-02-19 LAB — BASIC METABOLIC PANEL
Anion gap: 7 (ref 5–15)
BUN: 38 mg/dL — ABNORMAL HIGH (ref 6–20)
CO2: 26 mmol/L (ref 22–32)
Calcium: 8.2 mg/dL — ABNORMAL LOW (ref 8.9–10.3)
Chloride: 102 mmol/L (ref 98–111)
Creatinine, Ser: 2.33 mg/dL — ABNORMAL HIGH (ref 0.44–1.00)
GFR, Estimated: 28 mL/min — ABNORMAL LOW (ref >60)
Glucose, Bld: 323 mg/dL — ABNORMAL HIGH (ref 70–99)
Potassium: 4.6 mmol/L (ref 3.5–5.1)
Sodium: 135 mmol/L (ref 135–145)

## 2021-02-19 LAB — SEDIMENTATION RATE: Sed Rate: 120 mm/hr — ABNORMAL HIGH (ref 0–22)

## 2021-02-19 LAB — GLUCOSE, CAPILLARY
Glucose-Capillary: 342 mg/dL — ABNORMAL HIGH (ref 70–99)
Glucose-Capillary: 320 mg/dL — ABNORMAL HIGH (ref 70–99)
Glucose-Capillary: 126 mg/dL — ABNORMAL HIGH (ref 70–99)
Glucose-Capillary: 42 mg/dL — CL (ref 70–99)
Glucose-Capillary: 91 mg/dL (ref 70–99)

## 2021-02-19 LAB — CBC WITH DIFFERENTIAL/PLATELET
Abs Immature Granulocytes: 0.05 10*3/uL (ref 0.00–0.07)
Basophils Absolute: 0 10*3/uL (ref 0.0–0.1)
Basophils Relative: 0 %
Eosinophils Absolute: 0.1 10*3/uL (ref 0.0–0.5)
Eosinophils Relative: 1 %
HCT: 30.4 % — ABNORMAL LOW (ref 36.0–46.0)
Hemoglobin: 10 g/dL — ABNORMAL LOW (ref 12.0–15.0)
Immature Granulocytes: 1 %
Lymphocytes Relative: 16 %
Lymphs Abs: 1.2 10*3/uL (ref 0.7–4.0)
MCH: 29.6 pg (ref 26.0–34.0)
MCHC: 32.9 g/dL (ref 30.0–36.0)
MCV: 89.9 fL (ref 80.0–100.0)
Monocytes Absolute: 0.4 10*3/uL (ref 0.1–1.0)
Monocytes Relative: 6 %
Neutro Abs: 5.4 10*3/uL (ref 1.7–7.7)
Neutrophils Relative %: 76 %
Platelets: 426 10*3/uL — ABNORMAL HIGH (ref 150–400)
RBC: 3.38 MIL/uL — ABNORMAL LOW (ref 3.87–5.11)
RDW: 13.3 % (ref 11.5–15.5)
WBC: 7.2 10*3/uL (ref 4.0–10.5)
nRBC: 0 % (ref 0.0–0.2)

## 2021-02-19 LAB — C-REACTIVE PROTEIN: CRP: 4.1 mg/dL — ABNORMAL HIGH (ref <1.0)

## 2021-02-19 MED ORDER — GLUCOSE 40 % PO GEL
1.0000 | Freq: Once | ORAL | Status: DC | PRN
  Filled 2021-02-19: qty 1.21

## 2021-02-19 MED ORDER — METHOCARBAMOL 500 MG PO TABS
500.0000 mg | ORAL_TABLET | Freq: Two times a day (BID) | ORAL | Status: DC | PRN
  Administered 2021-02-21: 500 mg via ORAL
  Filled 2021-02-19: qty 1

## 2021-02-19 MED ORDER — CARVEDILOL 25 MG PO TABS
25.0000 mg | ORAL_TABLET | Freq: Two times a day (BID) | ORAL | Status: DC
  Administered 2021-02-19 – 2021-02-21 (×5): 25 mg via ORAL
  Filled 2021-02-19 (×5): qty 1

## 2021-02-19 NOTE — Progress Notes (Signed)
At [facility] pt's blood glucose was 42. Received order for glucose gel and patient refused to take it. After drinking orange juice x3, pt's blood sugar went up to 91. Will continue to assess blood glucose throughout shift.

## 2021-02-19 NOTE — Progress Notes (Signed)
[facility] Progress Note  Subjective: UOP 3.2 L yesterday, creat stable 2.3, BP's normal to high.   Vitals:   02/19/21 0210 02/19/21 0326 02/19/21 0948 02/19/21 1148  BP:  (!) 152/72  (!) 147/102  Pulse:   (S) (!) 115 (!) 109  Resp:  _0 Temp:  97.8 F (36.6 C)  99 F (37.2 C)  TempSrc:  Oral  Oral  SpO2:  98% 99% 100%  Weight: 72.4 kg     Height:        Exam:  alert, nad frail and chronically ill appearing  no jvd  Chest cta bilat  Cor reg no RG  Abd soft ntnd no ascites   Ext diffuse 2+ pitting LE/ UE edema   Alert, NF, ox3        Date                           Creat               eGFR   2012- 2017                0.66- 1.12   2020                          1.30 Jun 2020                   1.24                                April- June 2022        1.41- 2.03        33- 51, stage 3b   August 2022              2.35 >> 1.73    AKI episode   Sept 9/3- 9/5             2.03- 2.20        30-32 ml/min, stage 3b              Sept 17                      1.98                 34    Sept 18                     2.10    Sept 19                     2.22    Sept 20                     2.26                 29      Home meds include - norvasc, coreg, lasix 20 bid, neurontin, lantus 5u qd, lispro tid ssi, prilosec, percocet prn, protonix, vasotec 69m qd, prn's    UA 9/18  - cloudy, 21-50 wbc, 11-20 rbc, many bact, >300 prot   Last UPC ratio in April 2022 = 11.4 gm    CT abd > Adrenals/Urinary Tract: Adrenal glands are unremarkable. Kidneys are normal, without renal calculi, focal lesion, or hydronephrosis. Bladder is unremarkable.  CT abd / pelv - IMPRESSION: 1. Bilateral pleural effusions with airspace disease in the lung bases, possibly pneumonia or edema. 2. Diffuse edema throughout the subcutaneous fatty tissues. 3. Moderate free fluid throughout the abdomen and pelvis, likely ascites. Effusions and edematous changes are new since the previous study.      24 hr urine protein 02/16/21  = 5.8 gms       Assessment/ Plan: AKI on CKD 3b - b/l creat 1.4- 2.0 from mid 2022, eGFR 33- 51. Pt has nephrotic syndrome, from type 1 DM most likely.  Had 11 gm protein in April and 5.8gm here on a 24hr urine. Has missed last 2 appts w/ her renal MD Dr Hollie Salk.  Sig volume overload/ anasarca for several mos now per the pt. Pt admitted for diuresis and IV lasix was started. Have ^'d to  60 bid and is diuresing well. Slight creat bump is expected, creat stable now. Holding acei during diuresis. Still w/ anasarca, will need another few days most likely. Will follow.   Constipation - admitting diagnosis, per pmd DM1 - on insulin, A1c 12 most recently Anemia - Hb 6- 8 here, sp 1u prbc's HTN - getting coreg 12.5 bid, norvasc on hold d/t edema. If needed would use hydralazine next, and/or ^coreg.     Rob Marteze Vecchio 02/19/2021, 12:29 PM   Recent Labs  Lab 02/17/21 0517 02/17/21 1630 02/18/21 0446 02/19/21 0515  K 4.5  --  4.6 4.6  BUN 44*  --  42* 38*  CREATININE 2.26*  --  2.32* 2.33*  CALCIUM 7.7*  --  7.7* 8.2*  PHOS 4.0  --   --   --   HGB 6.9*   < > 9.1* 10.0*   < > = values in this interval not displayed.    Inpatient medications:  carvedilol  25 mg Oral BID WC   Chlorhexidine Gluconate Cloth  6 each Topical Daily   furosemide  60 mg Intravenous BID   hydrocortisone   Rectal BID   insulin aspart  0-5 Units Subcutaneous QHS   insulin aspart  0-9 Units Subcutaneous TID WC   insulin aspart  2 Units Subcutaneous TID WC   insulin glargine-yfgn  5 Units Subcutaneous Daily   metroNIDAZOLE  500 mg Oral Q12H    morphine injection  2 mg Intravenous Once   pantoprazole  40 mg Oral Daily   senna-docusate  1 tablet Oral BID    acetaminophen **OR** acetaminophen, bisacodyl, gabapentin, methocarbamol, ondansetron **OR** ondansetron (ZOFRAN) IV, oxyCODONE-acetaminophen **AND** oxyCODONE, polyethylene glycol

## 2021-02-19 NOTE — Progress Notes (Signed)
PROGRESS NOTE  Ann Robertson  DOB: [birth year]  PCP: [facility] OZD:[id number]  [place]: 02/14/2021  LOS: 3 days  Hospital Day: 6   Chief Complaint  Patient presents with   Constipation    Brief narrative: Ann Robertson is a 30 y.o. female with PMH significant for 30 year old female PMH of DM1, HLD, diabetic neuropathy. Patient presented to the ED on 9/17 with complaint of abdominal pain, constipation despite multiple laxatives at home.    In the ED, she was hemodynamically stable.   CT abdomen and pelvis showed scattered stool throughout the colon without evidence of bowel obstruction or inflammation.  She also had moderate free fluid throughout the abdomen pelvis likely ascites.  She had diffuse edema throughout the subcutaneous fatty tissue as well as bilateral pleural effusion. Urinalysis showed more than 300 proteins. Admitted to hospitalist service. See below for details.  Subjective: Patient was seen and examined this morning. On Lasix IV 60 mg twice daily.  Anasarca gradually improving only.  Continues to have labial swelling and pain in the groin. Also seems to have significant tenderness on palpation of both flanks.     Assessment/Plan: AKI on CKD 3B Rule out nephrotic syndrome - Anasarca, Proteinuria, Hypoalbuminemia -Presented with AKI, anasarca, proteinuria, hypoalbuminemia  -Nephrology following.  Probably nephrotic syndrome -Currently Lasix IV 60 mg twice daily.  Creatinine stable today Recent Labs    01/28/21 0310 01/31/21 0800 02/01/21 1520 02/02/21 0402 02/14/21 2130 02/15/21 0514 02/16/21 0434 02/17/21 0517 02/18/21 0446 02/19/21 0515  BUN 27* 38* 40* 43* 43* 49* 46* 44* 42* 38*  CREATININE 2.11* 2.09* 2.03* 2.20* 1.98* 2.10* 2.22* 2.26* 2.32* 2.33*   Significant pain and tenderness in both flanks Groin pain Acute urinary retention -Patient continues to have simple tenderness on both flanks and complains of persistent pain in the  groin.  She has labial swelling without evidence of cellulitis or underlying abscess.   -Has a Foley catheter in place which is required because of urinary retention. -CT abdomen and pelvis from 9/17 did not show any evidence of inflammation in the perineal area.  Both kidneys were normal without calculi, focal lesion or hydronephrosis.   -No fever, WBC count normal.   -CRP slightly elevated to 4.1, ESR pending. -Felt some Relief with Flexeril Yesterday.  Start on Robaxin 20 mg twice daily as needed.  Uncontrolled type 2 diabetes mellitus Hyperglycemia/hypoglycemia -A1c 12 on 01/26/2021.  Blood sugar level wildly fluctuating.   -Currently on 5 units Lantus and sliding scale insulin.  Patient does not want comply with carb controlled diet.  She wants to be in regular diet -Blood sugar this morning was elevated to 342.  Continue Accu-Cheks. -She may benefit from following up with an endocrinologist as an outpatient.  May qualify for an insulin pump. Recent Labs  Lab 02/18/21 0714 02/18/21 1155 02/18/21 1700 02/18/21 2036 02/19/21 0801  GLUCAP 127* 94 87 131* 342*   Acute anemia  Anemia of chronic disease -Hemoglobin at baseline between 7 and 8.  No active bleeding but hemoglobin was at the lowest of 6.9 on 9/20.  1 unit of PRBC was given.  Hemoglobin is at 10 today.  Continue to monitor Recent Labs    02/01/21 1519 02/01/21 1520 02/16/21 0926 02/17/21 0517 02/17/21 1630 02/18/21 0446 02/19/21 0515  HGB  --    < > 7.1* 6.9* 10.0* 9.1* 10.0*  MCV  --    < > 87.7 88.5  --  89.4 89.9  VITAMINB12 389  --   --   --   --   --   --  FOLATE 6.3  --   --   --   --   --   --   FERRITIN 121  --   --   --   --   --   --   TIBC 261  --   --   --   --   --   --   IRON 35  --   --   --   --   --   --    < > = values in this interval not displayed.   Sinus tachycardia Essential hypertension -Currently on Coreg 12.5 mg twice daily and IV Lasix.  Continues to have tachycardia and elevated  blood pressure.  Currently secondary to pain.  I will up the dose of Coreg to 25 mg daily.  Continue to monitor.  Severe constipation -Presented with abdominal pain, constipation for several days not responding to laxatives at home.  -In hospital, she is getting regular bowel regimen.  Has had few episodes of hard stool.  Continue regular bowel regimen -Encourage ambulation.  Bacterial vaginosis -Complain of vaginal discharge.  Clue cells present on wet mount prep.  She has been initiated on Flagyl 500 mg twice daily for a week.  Mobility: Encourage ambulation Code Status:   Code Status: Full Code  Nutritional status: Body mass index is 27.4 kg/m.     Diet:  Diet Order             Diet regular Room service appropriate? Yes; Fluid consistency: Thin  Diet effective now                  DVT prophylaxis:  SCDs Start: 02/15/21 0032   Antimicrobials: Flagyl twice daily Fluid: Not on IV fluid Consultants: Nephrology Family Communication: None at bedside  Status is: Inpatient  Remains inpatient appropriate because: Work-up in progress for nephrotic syndrome.  Dispo: The patient is from: Home              Anticipated d/c is to: Home in 2 to 3 days              Patient currently is not medically stable to d/c.   Difficult to place patient No     Infusions:     Scheduled Meds:  carvedilol  25 mg Oral BID WC   Chlorhexidine Gluconate Cloth  6 each Topical Daily   furosemide  60 mg Intravenous BID   hydrocortisone   Rectal BID   insulin aspart  0-5 Units Subcutaneous QHS   insulin aspart  0-9 Units Subcutaneous TID WC   insulin aspart  2 Units Subcutaneous TID WC   insulin glargine-yfgn  5 Units Subcutaneous Daily   metroNIDAZOLE  500 mg Oral Q12H    morphine injection  2 mg Intravenous Once   pantoprazole  40 mg Oral Daily   senna-docusate  1 tablet Oral BID    Antimicrobials: Anti-infectives (From admission, onward)    Start     Dose/Rate Route Frequency  Ordered Stop   02/16/21 1430  metroNIDAZOLE (FLAGYL) tablet 500 mg  Status:  Discontinued        500 mg Oral Every 12 hours 02/16/21 1330 02/16/21 1330   02/16/21 1430  metroNIDAZOLE (FLAGYL) tablet 500 mg        500 mg Oral Every 12 hours 02/16/21 1330 02/23/21 0959   02/15/21 1000  metroNIDAZOLE (FLAGYL) tablet 500 mg  Status:  Discontinued        500 mg Oral  Every 12 hours 02/15/21 0421 02/15/21 1749   02/15/21 0230  metroNIDAZOLE (FLAGYL) IVPB 500 mg  Status:  Discontinued        500 mg 100 mL/hr over 60 Minutes Intravenous 2 times daily 02/15/21 0211 02/15/21 0421       PRN meds: acetaminophen **OR** acetaminophen, bisacodyl, gabapentin, methocarbamol, morphine injection, ondansetron **OR** ondansetron (ZOFRAN) IV, oxyCODONE-acetaminophen **AND** oxyCODONE, polyethylene glycol   Objective: Vitals:   02/19/21 0326 02/19/21 0948  BP: (!) 152/72   Pulse:  (!) 115  Resp: 20 20  Temp: 97.8 F (36.6 C)   SpO2: 98% 99%    Intake/Output Summary (Last 24 hours) at 02/19/2021 1007 Last data filed at 02/19/2021 0328 Gross per 24 hour  Intake 300 ml  Output 1550 ml  Net -1250 ml   Filed Weights   02/18/21 0459 02/18/21 2031 02/19/21 0210  Weight: 74.2 kg 72.4 kg 72.4 kg   Weight change: -1.8 kg Body mass index is 27.4 kg/m.   Physical Exam: General exam: Pleasant, young African-American female. Skin: No rashes, lesions or ulcers. HEENT: Atraumatic, normocephalic, no obvious bleeding Lungs: Clear to auscultation bilaterally CVS: Tachycardic, no murmur. GI/Abd soft, mild diffuse tenderness present, significant tenderness in both flanks, bowel sound present Groin: Continues to have swollen labia majora both sides.  No evidence of cellulitis, abscess or an open wound.  No evidence of hemorrhoids or anal fissure CNS: Alert, awake, oriented x3 Psychiatry: Tearful on abdominal palpation.   Extremities: no calf tenderness.  Continues to have 1+ bilateral pedal edema  Data  Review: I have personally reviewed the laboratory data and studies available.  Recent Labs  Lab 02/14/21 2130 02/15/21 0514 02/16/21 0926 02/17/21 0517 02/17/21 1630 02/18/21 0446 02/19/21 0515  WBC 12.5* 11.6* 10.7* 8.9  --  6.7 7.2  NEUTROABS 10.8*  --  8.7* 6.4  --  4.5 5.4  HGB 7.4* 7.3* 7.1* 6.9* 10.0* 9.1* 10.0*  HCT 23.1* 22.4* 21.3* 20.8* 30.0* 27.0* 30.4*  MCV 90.6 90.7 87.7 88.5  --  89.4 89.9  PLT 338 331 379 302  --  316 426*   Recent Labs  Lab 02/15/21 0514 02/16/21 0434 02/17/21 0517 02/18/21 0446 02/19/21 0515  NA 134* 136 132* 135 135  K 5.0 4.8 4.5 4.6 4.6  CL 100 102 101 103 102  CO2 20* _0 GLUCOSE 511* 168* 160* 117* 323*  BUN 49* 46* 44* 42* 38*  CREATININE 2.10* 2.22* 2.26* 2.32* 2.33*  CALCIUM 8.2* 8.4* 7.7* 7.7* 8.2*  MG  --   --  1.8  --   --   PHOS  --   --  4.0  --   --     F/u labs ordered Unresulted Labs (From admission, onward)     Start     Ordered   02/19/21 0851  Sedimentation rate  Add-on,   AD        02/19/21 0321            Signed, Terrilee Croak, MD [facility] 02/19/2021

## 2021-02-19 NOTE — Progress Notes (Signed)
   02/19/21 0948  Assess: MEWS Score  Pulse Rate (!) (S)  115  Resp 20  SpO2 99 %  O2 Device Room Air  Patient Activity (if Appropriate) In bed  Assess: MEWS Score  MEWS Temp 0  MEWS Systolic 0  MEWS Pulse 2  MEWS RR 0  MEWS LOC 0  MEWS Score 2  MEWS Score Color Yellow  Assess: if the MEWS score is Yellow or Red  Were vital signs taken at a resting state? Yes  Focused Assessment No change from prior assessment  Does the patient meet 2 or more of the SIRS criteria? No  MEWS guidelines implemented *See Row Information* Yes  Treat  MEWS Interventions Escalated (See documentation below)  Pain Scale 0-10  Pain Score 0  Take Vital Signs  Increase Vital Sign Frequency  Yellow: Q 2hr X 2 then Q 4hr X 2, if remains yellow, continue Q 4hrs  Escalate  MEWS: Escalate Yellow: discuss with charge nurse/RN and consider discussing with provider and RRT  Notify: Charge Nurse/RN  Name of Charge Nurse/RN Notified Jazlynne Milliner, RN  Date Charge Nurse/RN Notified 02/19/21  Time Charge Nurse/RN Notified 1144  Notify: Provider  Provider Name/Title Dahal, MD  Date Provider Notified 02/19/21  Time Provider Notified [phone]  Notification Type Page  Notification Reason Other (Comment) (Yellow MEWS)  Provider response No new orders  Document  Patient Outcome Other (Comment) (No new meds ordered. MD aware of yellow MEWS.)  Progress note created (see row info) Yes  Assess: SIRS CRITERIA  SIRS Temperature  0  SIRS Pulse 1  SIRS Respirations  0  SIRS WBC 0  SIRS Score Sum  1  Pt in yellow MEWS. Yellow MEWS protocol implemented. MD made aware. Pt Aox4.

## 2021-02-20 ENCOUNTER — Inpatient Hospital Stay (HOSPITAL_COMMUNITY): Payer: Medicaid Other

## 2021-02-20 LAB — GLUCOSE, CAPILLARY
Glucose-Capillary: 140 mg/dL — ABNORMAL HIGH (ref 70–99)
Glucose-Capillary: 152 mg/dL — ABNORMAL HIGH (ref 70–99)
Glucose-Capillary: 159 mg/dL — ABNORMAL HIGH (ref 70–99)
Glucose-Capillary: 194 mg/dL — ABNORMAL HIGH (ref 70–99)
Glucose-Capillary: 70 mg/dL (ref 70–99)
Glucose-Capillary: 84 mg/dL (ref 70–99)

## 2021-02-20 LAB — CBC WITH DIFFERENTIAL/PLATELET
Abs Immature Granulocytes: 0.04 10*3/uL (ref 0.00–0.07)
Basophils Absolute: 0 10*3/uL (ref 0.0–0.1)
Basophils Relative: 1 %
Eosinophils Absolute: 0.2 10*3/uL (ref 0.0–0.5)
Eosinophils Relative: 3 %
HCT: 30.7 % — ABNORMAL LOW (ref 36.0–46.0)
Hemoglobin: 10.1 g/dL — ABNORMAL LOW (ref 12.0–15.0)
Immature Granulocytes: 1 %
Lymphocytes Relative: 21 %
Lymphs Abs: 1.8 10*3/uL (ref 0.7–4.0)
MCH: 30.1 pg (ref 26.0–34.0)
MCHC: 32.9 g/dL (ref 30.0–36.0)
MCV: 91.4 fL (ref 80.0–100.0)
Monocytes Absolute: 0.5 10*3/uL (ref 0.1–1.0)
Monocytes Relative: 6 %
Neutro Abs: 5.6 10*3/uL (ref 1.7–7.7)
Neutrophils Relative %: 68 %
Platelets: 469 10*3/uL — ABNORMAL HIGH (ref 150–400)
RBC: 3.36 MIL/uL — ABNORMAL LOW (ref 3.87–5.11)
RDW: 13.3 % (ref 11.5–15.5)
WBC: 8.2 10*3/uL (ref 4.0–10.5)
nRBC: 0 % (ref 0.0–0.2)

## 2021-02-20 LAB — BASIC METABOLIC PANEL
Anion gap: 9 (ref 5–15)
BUN: 38 mg/dL — ABNORMAL HIGH (ref 6–20)
CO2: 27 mmol/L (ref 22–32)
Calcium: 8.1 mg/dL — ABNORMAL LOW (ref 8.9–10.3)
Chloride: 98 mmol/L (ref 98–111)
Creatinine, Ser: 1.96 mg/dL — ABNORMAL HIGH (ref 0.44–1.00)
GFR, Estimated: 34 mL/min — ABNORMAL LOW (ref >60)
Glucose, Bld: 150 mg/dL — ABNORMAL HIGH (ref 70–99)
Potassium: 4.2 mmol/L (ref 3.5–5.1)
Sodium: 134 mmol/L — ABNORMAL LOW (ref 135–145)

## 2021-02-20 MED ORDER — MORPHINE SULFATE (PF) 2 MG/ML IV SOLN
1.0000 mg | INTRAVENOUS | Status: DC | PRN
Start: 2021-02-20 — End: 2021-02-22
  Administered 2021-02-20: 1 mg via INTRAVENOUS
  Filled 2021-02-20: qty 1

## 2021-02-20 MED ORDER — SODIUM CHLORIDE 0.9 % IV SOLN
2.0000 g | INTRAVENOUS | Status: DC
  Administered 2021-02-20: 2 g via INTRAVENOUS
  Filled 2021-02-20 (×2): qty 20

## 2021-02-20 MED ORDER — IOHEXOL 9 MG/ML PO SOLN
500.0000 mL | ORAL | Status: AC
  Administered 2021-02-20: 500 mL via ORAL

## 2021-02-20 MED ORDER — TORSEMIDE 20 MG PO TABS
20.0000 mg | ORAL_TABLET | Freq: Two times a day (BID) | ORAL | Status: DC
  Administered 2021-02-21 (×2): 20 mg via ORAL
  Filled 2021-02-20 (×2): qty 1

## 2021-02-20 MED ORDER — FUROSEMIDE 10 MG/ML IJ SOLN
60.0000 mg | Freq: Two times a day (BID) | INTRAMUSCULAR | Status: AC
  Administered 2021-02-20: 60 mg via INTRAVENOUS
  Filled 2021-02-20: qty 6

## 2021-02-20 NOTE — Progress Notes (Addendum)
PROGRESS NOTE  Ann Robertson  DOB: April 20, 1991  PCP: [facility] [place] YTK:[id number]  [place]: 02/14/2021  LOS: 4 days  Hospital Day: 7   Chief Complaint  Patient presents with   Constipation    Brief narrative: Ann Robertson is a 30 y.o. female with PMH significant for 30 year old female PMH of DM1, HLD, diabetic neuropathy. Patient presented to the ED on 9/17 with complaint of abdominal pain, constipation despite multiple laxatives at home.    In the ED, she was hemodynamically stable.   CT abdomen and pelvis showed scattered stool throughout the colon without evidence of bowel obstruction or inflammation.  She also had moderate free fluid throughout the abdomen pelvis likely ascites.  She had diffuse edema throughout the subcutaneous fatty tissue as well as bilateral pleural effusion. Urinalysis showed more than 300 proteins. Admitted to hospitalist service. See below for details.  Subjective: Patient was seen and examined this morning. Feels better.  Had a good bowel movement yesterday.  Swelling improving.  Perineal pain improving.  Blood pressure and creatinine improving as well.  Assessment/Plan: AKI on CKD 3B Nephrotic syndrome  -Presented with AKI, anasarca, proteinuria, hypoalbuminemia  -Nephrology consult appreciated.  Diuresed with IV Lasix.  Clinically improving.  Creatinine started to improve as well. Recent Labs    01/31/21 0800 02/01/21 1520 02/02/21 0402 02/14/21 2130 02/15/21 0514 02/16/21 0434 02/17/21 0517 02/18/21 0446 02/19/21 0515 02/20/21 0449  BUN 38* 40* 43* 43* 49* 46* 44* 42* 38* 38*  CREATININE 2.09* 2.03* 2.20* 1.98* 2.10* 2.22* 2.26* 2.32* 2.33* 1.96*    Significant pain and tenderness in both flanks Groin pain Acute urinary retention -Patient continues to have simple tenderness on both flanks and complains of persistent pain in the groin.  She has labial swelling without evidence of cellulitis or underlying abscess.   -Has a  Foley catheter in place which is required because of urinary retention. -CT abdomen and pelvis from 9/17 did not show any evidence of inflammation in the perineal area.  Both kidneys were normal without calculi, focal lesion or hydronephrosis.   -No fever, WBC count normal.   -CRP and ESR elevated. -Pain control is better today.  Continue as needed morphine, oxycodone.  Encouraged to try Robaxin to minimize the use of narcotics.  Uncontrolled type 1 diabetes mellitus Hyperglycemia/hypoglycemia -A1c 12 on 01/26/2021.  Blood sugar level wildly fluctuating.  Last 24 hours, she had a low blood sugar level of 42.  She has a strong appetite and does not want calorie restriction. -Currently on 5 units of Lantus and sliding scale insulin with Accu-Cheks. -She may benefit from following up with an endocrinologist as an outpatient.  May qualify for an insulin pump. Recent Labs  Lab 02/19/21 1941 02/19/21 2016 02/20/21 0036 02/20/21 0428 02/20/21 0803  GLUCAP 42* 91 70 152* 140*    Acute anemia  Anemia of chronic disease -Hemoglobin at baseline between 7 and 8.  No active bleeding but hemoglobin was at the lowest of 6.9 on 9/20.  1 unit of PRBC was given.  Hemoglobin currently running over 10.  Continue to monitor.   Recent Labs    02/01/21 1519 02/01/21 1520 02/17/21 0517 02/17/21 1630 02/18/21 0446 02/19/21 0515 02/20/21 0449  HGB  --    < > 6.9* 10.0* 9.1* 10.0* 10.1*  MCV  --    < > 88.5  --  89.4 89.9 91.4  VITAMINB12 389  --   --   --   --   --   --  FOLATE 6.3  --   --   --   --   --   --   FERRITIN 121  --   --   --   --   --   --   TIBC 261  --   --   --   --   --   --   IRON 35  --   --   --   --   --   --    < > = values in this interval not displayed.    Sinus tachycardia Essential hypertension -Currently on Coreg 25 mg twice daily and IV Lasix.  Heart rate and blood pressure seem to be improving.  Severe constipation -Presented with abdominal pain, constipation for  several days not responding to laxatives at home.  -In hospital, she is getting regular bowel regimen.  Had large bowel movement yesterday.    Bacterial vaginosis -Complain of vaginal discharge.  Clue cells present on wet mount prep.  She has been initiated on Flagyl 500 mg twice daily for a week.  Mobility: Encourage ambulation Code Status:   Code Status: Full Code  Nutritional status: Body mass index is 24.07 kg/m.     Diet:  Diet Order             Diet regular Room service appropriate? Yes; Fluid consistency: Thin  Diet effective now                  DVT prophylaxis:  SCDs Start: 02/15/21 0032   Antimicrobials: Flagyl twice daily Fluid: Not on IV fluid Consultants: Nephrology Family Communication: None at bedside  Status is: Inpatient  Remains inpatient appropriate because: Work-up in progress for nephrotic syndrome.  Dispo: The patient is from: Home              Anticipated d/c is to: Home in 2 to 3 days              Patient currently is not medically stable to d/c.   Difficult to place patient No     Infusions:     Scheduled Meds:  carvedilol  25 mg Oral BID WC   Chlorhexidine Gluconate Cloth  6 each Topical Daily   furosemide  60 mg Intravenous BID   hydrocortisone   Rectal BID   insulin aspart  0-5 Units Subcutaneous QHS   insulin aspart  0-9 Units Subcutaneous TID WC   insulin aspart  2 Units Subcutaneous TID WC   insulin glargine-yfgn  5 Units Subcutaneous Daily   metroNIDAZOLE  500 mg Oral Q12H    morphine injection  2 mg Intravenous Once   pantoprazole  40 mg Oral Daily   senna-docusate  1 tablet Oral BID    Antimicrobials: Anti-infectives (From admission, onward)    Start     Dose/Rate Route Frequency Ordered Stop   02/16/21 1430  metroNIDAZOLE (FLAGYL) tablet 500 mg  Status:  Discontinued        500 mg Oral Every 12 hours 02/16/21 1330 02/16/21 1330   02/16/21 1430  metroNIDAZOLE (FLAGYL) tablet 500 mg        500 mg Oral Every 12  hours 02/16/21 1330 02/23/21 0959   02/15/21 1000  metroNIDAZOLE (FLAGYL) tablet 500 mg  Status:  Discontinued        500 mg Oral Every 12 hours 02/15/21 0421 02/15/21 1749   02/15/21 0230  metroNIDAZOLE (FLAGYL) IVPB 500 mg  Status:  Discontinued  500 mg 100 mL/hr over 60 Minutes Intravenous 2 times daily 02/15/21 0211 02/15/21 0421       PRN meds: acetaminophen **OR** acetaminophen, bisacodyl, dextrose, gabapentin, methocarbamol, ondansetron **OR** ondansetron (ZOFRAN) IV, oxyCODONE-acetaminophen **AND** oxyCODONE, polyethylene glycol   Objective: Vitals:   02/20/21 0438 02/20/21 0829  BP: (!) 151/103 (!) 152/104  Pulse: 100 (!) 101  Resp: (!) 23 19  Temp: 98.3 F (36.8 C) 97.8 F (36.6 C)  SpO2: 98% 98%    Intake/Output Summary (Last 24 hours) at 02/20/2021 1114 Last data filed at 02/20/2021 0600 Gross per 24 hour  Intake 1200 ml  Output 2875 ml  Net -1675 ml    Filed Weights   02/19/21 0210 02/19/21 1114 02/20/21 0451  Weight: 72.4 kg 63.6 kg 63.6 kg   Weight change: -8.806 kg Body mass index is 24.07 kg/m.   Physical Exam: General exam: Pleasant, young African-American female.  Less pain today. Skin: No rashes, lesions or ulcers. HEENT: Atraumatic, normocephalic, no obvious bleeding Lungs: Clear to auscultation bilaterally CVS: Slight tachycardia, no murmur. GI/Abd soft, mild diffuse tenderness present, significant tenderness in both flanks, bowel sound present Groin: Bilateral labial swelling improving.  No evidence of cellulitis or abscess on examination again today.   CNS: Alert, awake, oriented x3 Psychiatry: Tearful on abdominal palpation.   Extremities: no calf tenderness.  Pedal edema improving.  Data Review: I have personally reviewed the laboratory data and studies available.  Recent Labs  Lab 02/16/21 0926 02/17/21 0517 02/17/21 1630 02/18/21 0446 02/19/21 0515 02/20/21 0449  WBC 10.7* 8.9  --  6.7 7.2 8.2  NEUTROABS 8.7* 6.4  --  4.5  5.4 5.6  HGB 7.1* 6.9* 10.0* 9.1* 10.0* 10.1*  HCT 21.3* 20.8* 30.0* 27.0* 30.4* 30.7*  MCV 87.7 88.5  --  89.4 89.9 91.4  PLT 379 302  --  316 426* 469*    Recent Labs  Lab 02/16/21 0434 02/17/21 0517 02/18/21 0446 02/19/21 0515 02/20/21 0449  NA 136 132* 135 135 134*  K 4.8 4.5 4.6 4.6 4.2  CL 102 101 103 102 98  CO2 25 24 26 26 27   GLUCOSE 168* 160* 117* 323* 150*  BUN 46* 44* 42* 38* 38*  CREATININE 2.22* 2.26* 2.32* 2.33* 1.96*  CALCIUM 8.4* 7.7* 7.7* 8.2* 8.1*  MG  --  1.8  --   --   --   PHOS  --  4.0  --   --   --      F/u labs ordered Unresulted Labs (From admission, onward)     Start     Ordered   02/20/21 0500  CBC with Differential/Platelet  Daily,   R      02/19/21 1010   02/20/21 4034  Basic metabolic panel  Daily,   R      02/19/21 1010            Signed, Terrilee Croak, MD [facility] 02/20/2021

## 2021-02-20 NOTE — Progress Notes (Signed)
Pt deferred foley removal.

## 2021-02-20 NOTE — Progress Notes (Signed)
Patient complained to the RN about worsening pain and swelling in the labia bilaterally   seen and examined this afternoon again. I do not feel swelling is worse than the morning but it is definitely more tender now. For last few days she has bilateral labial swelling which I suspected to be part of the nephrotic syndrome. In the absence of leukocytosis, fever, lactic acidosis, I did not start on antibiotics. Despite overall improvement in anasarca, labial swelling has not improved and it is more tender this afternoon. I called and discussed the case with GYN on-call Dr. Elgie Congo. Clinical diagnosis of labial cellulitis  Recommended an imaging to rule out abscess.  We will obtain noncontrast CT abdomen pelvis.  Unable to use contrast because of AKI Also will start the patient on IV Rocephin empirically at this time.

## 2021-02-20 NOTE — Progress Notes (Signed)
Report received from [place], [place] at 2000 hrs. Patient declines PO contrast for CT scan. Lovey Newcomer, NP notified.

## 2021-02-20 NOTE — Progress Notes (Addendum)
[facility] Progress Note  Subjective: UOP good, swelling better, labial pain better, wants to try to get up and walk  Vitals:   02/20/21 0438 02/20/21 0451 02/20/21 0829 02/20/21 1215  BP: (!) 151/103  (!) 152/104 (!) 137/96  Pulse: 100  (!) 101 (!) 101  Resp: (!) 23  19   Temp: 98.3 F (36.8 C)  97.8 F (36.6 C)   TempSrc: Oral  Oral   SpO2: 98%  98% 100%  Weight:  63.6 kg    Height:        Exam:  alert, nad frail and chronically ill appearing  no jvd  Chest cta bilat  Cor reg no RG  Abd soft ntnd no ascites   Ext 1+ pitting LE/ UE edema, better   Alert, NF, ox3        Date                           Creat               eGFR   2012- 2017                0.66- 1.12   2020                          1.30 Jun 2020                   1.24                                April- June 2022        1.41- 2.03        33- 51, stage 3b   August 2022              2.35 >> 1.73    AKI episode   Sept 9/3- 9/5             2.03- 2.20        30-32 ml/min, stage 3b              Sept 17                      1.98                 34    Sept 18                     2.10    Sept 19                     2.22    Sept 20                     2.26                 29      Home meds include - norvasc, coreg, lasix 20 bid, neurontin, lantus 5u qd, lispro tid ssi, prilosec, percocet prn, protonix, vasotec $RemoveBeforeDEI'5mg'[id number]$  qd, prn's    UA 9/18  - cloudy, 21-50 wbc, 11-20 rbc, many bact, >300 prot   Last UPC ratio in April 2022 = 11.4 gm    CT abd > Adrenals/Urinary Tract: Adrenal glands are unremarkable. Kidneys are normal, without renal calculi, focal lesion, or hydronephrosis. Bladder is  unremarkable.     CT abd / pelv - IMPRESSION: 1. Bilateral pleural effusions with airspace disease in the lung bases, possibly pneumonia or edema. 2. Diffuse edema throughout the subcutaneous fatty tissues. 3. Moderate free fluid throughout the abdomen and pelvis, likely ascites. Effusions and edematous changes are new  since the previous study.     24 hr urine protein 02/16/21  = 5.8 gms       Assessment/ Plan: AKI on CKD 3b - b/l creat 1.4- 2.0 from mid 2022, eGFR 33- 51. Pt has nephrotic syndrome, from type 1 DM most likely.  Had 11 gm protein in April and 5.8gm here on a 24hr urine. Has missed last 2 appts w/ her renal MD Dr Hollie Salk.  Sig volume overload/ anasarca for several mos now per the pt. Pt admitted for diuresis and IV lasix was started 9/20 and edema has improved significantly.Creat bumped up then bumped down today.  Will plan 24 hrs more of IV lasix and then can dc tomorrow on po diuretics, demadex 20 bid (will need Rx). Has f/u appt at [facility] on Oct 18th, see dc section. Canceled bmet for tomorrow. Will sign off.   Constipation - admitting diagnosis, per pmd DM1 - on insulin, A1c 12 most recently Anemia - Hb 6- 8 here, sp 1u prbc's HTN - getting coreg 12.5 bid, norvasc on hold d/t edema. If needed would use hydralazine and/or ^coreg as additional medication.     Rob Alianys Chacko 02/20/2021, 2:16 PM   Recent Labs  Lab 02/17/21 0517 02/17/21 1630 02/19/21 0515 02/20/21 0449  K 4.5   < > 4.6 4.2  BUN 44*   < > 38* 38*  CREATININE 2.26*   < > 2.33* 1.96*  CALCIUM 7.7*   < > 8.2* 8.1*  PHOS 4.0  --   --   --   HGB 6.9*   < > 10.0* 10.1*   < > = values in this interval not displayed.    Inpatient medications:  carvedilol  25 mg Oral BID WC   Chlorhexidine Gluconate Cloth  6 each Topical Daily   furosemide  60 mg Intravenous BID   hydrocortisone   Rectal BID   insulin aspart  0-5 Units Subcutaneous QHS   insulin aspart  0-9 Units Subcutaneous TID WC   insulin aspart  2 Units Subcutaneous TID WC   insulin glargine-yfgn  5 Units Subcutaneous Daily   metroNIDAZOLE  500 mg Oral Q12H    morphine injection  2 mg Intravenous Once   pantoprazole  40 mg Oral Daily   senna-docusate  1 tablet Oral BID    acetaminophen **OR** acetaminophen, bisacodyl, dextrose, gabapentin, methocarbamol, morphine  injection, ondansetron **OR** ondansetron (ZOFRAN) IV, oxyCODONE-acetaminophen **AND** oxyCODONE, polyethylene glycol

## 2021-02-20 NOTE — Plan of Care (Signed)

## 2021-02-21 LAB — CBC WITH DIFFERENTIAL/PLATELET
Abs Immature Granulocytes: 0.05 10*3/uL (ref 0.00–0.07)
Basophils Absolute: 0 10*3/uL (ref 0.0–0.1)
Basophils Relative: 0 %
Eosinophils Absolute: 0.2 10*3/uL (ref 0.0–0.5)
Eosinophils Relative: 3 %
HCT: 32.1 % — ABNORMAL LOW (ref 36.0–46.0)
Hemoglobin: 10.5 g/dL — ABNORMAL LOW (ref 12.0–15.0)
Immature Granulocytes: 1 %
Lymphocytes Relative: 16 %
Lymphs Abs: 1 10*3/uL (ref 0.7–4.0)
MCH: 29.7 pg (ref 26.0–34.0)
MCHC: 32.7 g/dL (ref 30.0–36.0)
MCV: 90.7 fL (ref 80.0–100.0)
Monocytes Absolute: 0.4 10*3/uL (ref 0.1–1.0)
Monocytes Relative: 7 %
Neutro Abs: 4.6 10*3/uL (ref 1.7–7.7)
Neutrophils Relative %: 73 %
Platelets: 537 10*3/uL — ABNORMAL HIGH (ref 150–400)
RBC: 3.54 MIL/uL — ABNORMAL LOW (ref 3.87–5.11)
RDW: 13.1 % (ref 11.5–15.5)
WBC: 6.3 10*3/uL (ref 4.0–10.5)
nRBC: 0 % (ref 0.0–0.2)

## 2021-02-21 LAB — BASIC METABOLIC PANEL
Anion gap: 9 (ref 5–15)
BUN: 37 mg/dL — ABNORMAL HIGH (ref 6–20)
CO2: 30 mmol/L (ref 22–32)
Calcium: 8.2 mg/dL — ABNORMAL LOW (ref 8.9–10.3)
Chloride: 97 mmol/L — ABNORMAL LOW (ref 98–111)
Creatinine, Ser: 2.1 mg/dL — ABNORMAL HIGH (ref 0.44–1.00)
GFR, Estimated: 32 mL/min — ABNORMAL LOW (ref >60)
Glucose, Bld: 209 mg/dL — ABNORMAL HIGH (ref 70–99)
Potassium: 4.3 mmol/L (ref 3.5–5.1)
Sodium: 136 mmol/L (ref 135–145)

## 2021-02-21 LAB — GLUCOSE, CAPILLARY
Glucose-Capillary: 114 mg/dL — ABNORMAL HIGH (ref 70–99)
Glucose-Capillary: 173 mg/dL — ABNORMAL HIGH (ref 70–99)
Glucose-Capillary: 246 mg/dL — ABNORMAL HIGH (ref 70–99)
Glucose-Capillary: 160 mg/dL — ABNORMAL HIGH (ref 70–99)

## 2021-02-21 MED ORDER — METRONIDAZOLE 500 MG PO TABS: 500.0000 mg | ORAL_TABLET | Freq: Three times a day (TID) | ORAL | 0 refills | Status: AC

## 2021-02-21 MED ORDER — CARVEDILOL 25 MG PO TABS: 25.0000 mg | ORAL_TABLET | Freq: Two times a day (BID) | ORAL | 0 refills | Status: DC

## 2021-02-21 MED ORDER — CEPHALEXIN 500 MG PO CAPS: 500.0000 mg | ORAL_CAPSULE | Freq: Three times a day (TID) | ORAL | 0 refills | Status: AC

## 2021-02-21 MED ORDER — SENNOSIDES-DOCUSATE SODIUM 8.6-50 MG PO TABS: 1.0000 | ORAL_TABLET | Freq: Two times a day (BID) | ORAL | 2 refills | Status: DC

## 2021-02-21 MED ORDER — TORSEMIDE 20 MG PO TABS: 20.0000 mg | ORAL_TABLET | Freq: Two times a day (BID) | ORAL | 2 refills | Status: DC

## 2021-02-21 MED ORDER — BISACODYL 10 MG RE SUPP: 10.0000 mg | Freq: Every day | RECTAL | 0 refills | Status: DC | PRN

## 2021-02-21 MED ORDER — METHOCARBAMOL 500 MG PO TABS: 500.0000 mg | ORAL_TABLET | Freq: Three times a day (TID) | ORAL | 0 refills | Status: AC | PRN

## 2021-02-21 MED ORDER — SACCHAROMYCES BOULARDII 250 MG PO CAPS: 250.0000 mg | ORAL_CAPSULE | Freq: Two times a day (BID) | ORAL | 0 refills | Status: AC

## 2021-02-21 NOTE — Plan of Care (Signed)
Pt wanting to go home. Leaving this afternoon. Followup with PCP.

## 2021-02-21 NOTE — Progress Notes (Signed)
Per phlebotomist, patient refused AM labs. Phlebotomy to offer lab draw again later this morning.

## 2021-02-21 NOTE — Progress Notes (Signed)
Pt has voided X 2. She is ready to go home. Will leave this afternoon once her "significant other" gets here to pick her up. Discharge instructions given/explained with pt verbalizing understanding.  Pt aware to followup with PCP.

## 2021-02-21 NOTE — Discharge Summary (Signed)
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 female with PMH significant for 30 year old female PMH of DM1, HLD, diabetic neuropathy. Patient presented to the ED on 9/17 with complaint of abdominal pain, constipation despite multiple laxatives at home.    In the ED, she was hemodynamically stable.   CT abdomen and pelvis showed scattered stool throughout the colon without evidence of bowel obstruction or inflammation.  She also had moderate free fluid throughout the abdomen pelvis likely ascites.  She had diffuse edema throughout the subcutaneous fatty tissue as well as bilateral pleural effusion. Urinalysis showed more than 300 proteins. Admitted to hospitalist service. See below for details.  Subjective: Patient was seen and examined this morning. Feels better.  Had a good bowel movement yesterday.  Swelling improving.  Perineal pain improving.  Blood pressure and creatinine improving as well.  Hospital course: AKI on CKD 3B Nephrotic syndrome  -Presented with AKI, anasarca, proteinuria, hypoalbuminemia  -Pt has nephrotic syndrome, from type 1 DM most likely.  Had 11 gm protein in April and 5.8gm here on a 24hr urine. Has missed last 2 appts w/ her renal MD Dr Hollie Salk.  -Nephrology consultation was obtained.  Patient was diuresed with IV Lasix with significant  improvement in edema as well as creatinine.  -Per nephrology recommendation, will plan to discharge the patient today on Demadex 20 mg twice daily.  -She has f/u appt at [facility] on Oct 18th. Recent Labs    02/01/21 1520 02/02/21 0402 02/14/21 2130 02/15/21 0514 02/16/21 0434 02/17/21 0517 02/18/21 0446 02/19/21 0515 02/20/21 0449 02/21/21 0813  BUN 40* 43* 43* 49* 46* 44* 42* 38* 38* 37*  CREATININE 2.03* 2.20* 1.98* 2.10* 2.22* 2.26* 2.32* 2.33* 1.96* 2.10*   Severe constipation -Severe constipation was her primary problem.  She was given aggressive bowel regimen.  She had several episodes of hard stool even requiring manual disimpaction.  With regular use of bowel regimen, her stool is soft now.  I would recommend to continue bowel regimen at home.    Abdominal plain, flank pain, groin pain -Abdominal pain is primarily due to constipation and improved with the relief of constipation.  Patient however had longer than expected course of pain on both flanks and labia.  Flank pain improved with Robaxin.  Bilateral labial pain was mostly because of swelling related to nephrotic syndrome.  On examination every day, she did not have any cellulitis or evidence of abscess in the perineal area.  On 9/23, I discussed the case with GYN on-call and start the patient on empiric antibiotics with IV Rocephin.  CT abdomen pelvis was obtained which did not show any evidence of local collection in the groin and perineum.  We will discharge the patient on 4 more days of oral Keflex with probiotics..   Acute urinary retention -Because of constipation and bilateral labial pain and groin pain,  patient had acute urinary tension.  She ED required Foley catheterization.  Foley catheter removed today.  Voiding trial successful.    Uncontrolled type 1 diabetes mellitus Hyperglycemia/hypoglycemia -A1c 12 on 01/26/2021.  She has brittle diabetes and her blood sugar level wildly fluctuates.  Also she is noncompliant with  diabetic diet and has been eating regular food.   -In last 48 hours, her blood sugar level has remained stable on 5 units of Lantus and sliding scale insulin.  Discharged with the same regimen.   -She will benefit from following up with an endocrinologist as an outpatient.  May qualify for an insulin pump. Recent Labs  Lab 02/20/21 1146 02/20/21 1732 02/20/21 2029 02/21/21 0326 02/21/21 0747  GLUCAP 194* 84 159* 114* 173*   Acute anemia  Anemia of chronic disease -Hemoglobin at baseline between 7 and 8.  No active bleeding but hemoglobin was at the lowest of 6.9 on 9/20.  1 unit of PRBC was given.  Hemoglobin currently running more than 10. Recent Labs    02/01/21 1519 02/01/21 1520 02/17/21 1630 02/18/21 0446 02/19/21 0515 02/20/21 0449 02/21/21 0813  HGB  --    < > 10.0* 9.1* 10.0* 10.1* 10.5*  MCV  --    < >  --  89.4 89.9 91.4 90.7  VITAMINB12 389  --   --   --   --   --   --   FOLATE 6.3  --   --   --   --   --   --   FERRITIN 121  --   --   --   --   --   --   TIBC 261  --   --   --   --   --   --   IRON 35  --   --   --   --   --   --    < > = values in this interval not displayed.   Sinus tachycardia Essential hypertension -Currently with heart rate and blood pressure better on Coreg 25 mg twice daily and diuretics.    Bacterial vaginosis -Complained of vaginal discharge on admission.  Clue cells present on wet mount prep.  She has been initiated on Flagyl 500 mg twice daily for a week.  4 more days after discharge.   Allergies as of 02/21/2021       Reactions   Hydromorphone Hcl Itching, Rash        Medication List     STOP taking these medications    amLODipine 10 MG tablet Commonly known as: NORVASC   enalapril 5 MG tablet Commonly known as: VASOTEC   furosemide 20 MG tablet Commonly known as: LASIX       TAKE these medications    bisacodyl 10 MG suppository Commonly known as: DULCOLAX Place 1 suppository (10 mg total) rectally daily as  needed for moderate constipation.   carvedilol 25 MG tablet Commonly known as: COREG Take 1 tablet (25 mg total) by mouth 2 (two) times daily with a meal. What changed:  medication strength how much to take   cephALEXin 500 MG capsule Commonly known as: KEFLEX Take 1 capsule (500 mg total) by mouth 3 (three) times daily for 4 days.   FreeStyle Libre 2 Reader [place] 1 each by Does not apply route in the morning, at noon, in the evening, and at bedtime.   gabapentin 600 MG tablet Commonly known as: NEURONTIN Take 600 mg by mouth  3 (three) times daily as needed (pain).   insulin lispro 100 UNIT/ML KwikPen Commonly known as: HUMALOG Inject 2-12 Units into the skin in the morning, at noon, and at bedtime. Per sliding scale   Lantus SoloStar 100 UNIT/ML Solostar Pen Generic drug: insulin glargine Inject 5 Units into the skin daily. And pen needles 1/day   methocarbamol 500 MG tablet Commonly known as: ROBAXIN Take 1 tablet (500 mg total) by mouth every 8 (eight) hours as needed for up to 5 days for muscle spasms.   metroNIDAZOLE 500 MG tablet Commonly known as: Flagyl Take 1 tablet (500 mg total) by mouth 3 (three) times daily for 4 days.   omeprazole 20 MG capsule Commonly known as: PRILOSEC Take 20 mg by mouth daily.   ondansetron 4 MG tablet Commonly known as: ZOFRAN Take 1 tablet (4 mg total) by mouth every 8 (eight) hours as needed for nausea or vomiting.   oxyCODONE-acetaminophen 10-325 MG tablet Commonly known as: PERCOCET Take 1 tablet by mouth every 4 (four) hours as needed for pain.   pantoprazole 40 MG tablet Commonly known as: PROTONIX Take 1 tablet (40 mg total) by mouth daily.   Pen Needles 30G X 8 MM Misc 1 each by Does not apply route daily. E11.9   saccharomyces boulardii 250 MG capsule Commonly known as: FLORASTOR Take 1 capsule (250 mg total) by mouth 2 (two) times daily for 5 days.   senna-docusate 8.6-50 MG tablet Commonly known as:  Senokot-S Take 1 tablet by mouth 2 (two) times daily.   torsemide 20 MG tablet Commonly known as: Demadex Take 1 tablet (20 mg total) by mouth 2 (two) times daily.   Vitamin D (Ergocalciferol) 1.25 MG (50000 UNIT) Caps capsule Commonly known as: DRISDOL Take 50,000 Units by mouth every Wednesday.               Durable Medical Equipment  (From admission, onward)           Start     Ordered   02/21/21 1124  For home use only DME Bedside commode  Once       Question:  Patient needs a bedside commode to treat with the following condition  Answer:  Impaired mobility   02/21/21 1123            Discharge Instructions:  Diet Recommendation:  Discharge Diet Orders (From admission, onward)     Start     Ordered   02/21/21 0000  Diet - low sodium heart healthy        02/21/21 1123   02/21/21 0000  Diet Carb Modified        02/21/21 1123              Follow with Primary [facility] in 7 days   Get CBC/BMP checked in next visit within 1 week by PCP or SNF MD ( we routinely change or add medications that can affect your baseline labs and fluid status, therefore we recommend that you get the mentioned basic workup next visit with your PCP, your PCP may decide not to get them or add new tests based on their clinical decision)  On your next visit with your PCP, please Get Medicines reviewed and adjusted.  Please request your PCP  to go over all Hospital Tests and Procedure/Radiological results at the follow up, please get all Hospital records sent to your Prim MD by signing hospital release before you go home.  Activity: As tolerated  with Full fall precautions use walker/cane & assistance as needed  For Heart failure patients - Check your Weight same time everyday, if you gain over 2 pounds, or you develop in leg swelling, experience more shortness of breath or chest pain, call your Primary MD immediately. Follow Cardiac Low Salt Diet and 1.5 lit/day  fluid restriction.  If you have smoked or chewed Tobacco in the last 2 yrs please stop smoking, stop any regular Alcohol  and or any Recreational drug use.  If you experience worsening of your admission symptoms, develop shortness of breath, life threatening emergency, suicidal or homicidal thoughts you must seek medical attention immediately by calling 911 or calling your MD immediately  if symptoms less severe.  You Must read complete instructions/literature along with all the possible adverse reactions/side effects for all the Medicines you take and that have been prescribed to you. Take any new Medicines after you have completely understood and accpet all the possible adverse reactions/side effects.   Do not drive, operate heavy machinery, perform activities at heights, swimming or participation in water activities or provide baby sitting services if your were admitted for syncope or siezures until you have seen by Primary MD or a Neurologist and advised to do so again.  Do not drive when taking Pain medications.  Do not take more than prescribed Pain, Sleep and Anxiety Medications  Wear Seat belts while driving.   Please note You were cared for by a hospitalist during your hospital stay. If you have any questions about your discharge medications or the care you received while you were in the hospital after you are discharged, you can call the unit and asked to speak with the hospitalist on call if the hospitalist that took care of you is not available. Once you are discharged, your primary care physician will handle any further medical issues. Please note that NO REFILLS for any discharge medications will be authorized once you are discharged, as it is imperative that you return to your primary care physician (or establish a relationship with a primary care physician if you do not have one) for your aftercare needs so that they can reassess your need for medications and monitor your lab  values.    Follow ups:    Follow-up Information     Justin Mend, MD Follow up on 03/17/2021.   Specialty: Internal Medicine Why: appt w/ kidney doctor, Dr Johnney Ou (covering for Dr Hollie Salk on maternity leave), appt is at 11:00 on October 18th Contact information: [place] [address] [place], [place] Follow up.   Contact information: [place] [address] [phone]                 Wound care:   Incision 07/05/13 Abdomen Other (Comment) (Active)  Date First Assessed/Time First Assessed: 07/05/13 1413   Location: Abdomen  Location Orientation: Other (Comment)    Assessments 07/05/2013  3:54 PM 07/08/2013  8:40 AM  Dressing Type Honeycomb Honeycomb  Dressing Dry;Intact Clean;Dry;Intact  Site / Wound Assessment Dry --  Margins Attached edges (approximated) Attached edges (approximated)  Closure Sutures Sutures  Drainage Amount Scant None  Drainage Description Sanguineous --     No Linked orders to display     Incision 07/05/13 Perineum Other (Comment) (Active)  Date First Assessed/Time First Assessed: 07/05/13 1413   Location: Perineum  Location Orientation: Other (Comment)    No assessment data to display  No Linked orders to display     Incision (Closed) 07/25/14 Perineum Other (Comment) (Active)  Date First Assessed/Time First Assessed: 07/25/14 1508   Location: Perineum  Location Orientation: Other (Comment)    Assessments 07/25/2014  3:42 PM 07/26/2014  8:45 PM  Dressing Type Peripads Peripads  Dressing New drainage --  Dressing Change Frequency -- PRN  Site / Wound Assessment Bleeding --  Drainage Amount Minimal Minimal  Drainage Description Sanguineous Sanguineous     No Linked orders to display     Incision (Closed) 07/25/14 Abdomen Other (Comment) (Active)  Date First Assessed/Time First Assessed: 07/25/14 1516   Location: Abdomen  Location Orientation: Other (Comment)     Assessments 07/25/2014  3:42 PM 07/27/2014 11:00 PM  Dressing Type Pressure dressing Honeycomb;Abdominal binder  Dressing Clean;Dry;Intact Old drainage (marked)  Drainage Amount None --     No Linked orders to display     Incision (Closed) 11/21/15 Perineum Other (Comment) (Active)  Date First Assessed/Time First Assessed: 11/21/15 1513   Location: Perineum  Location Orientation: Other (Comment)    Assessments 11/21/2015  4:15 PM 11/22/2015  8:21 PM  Dressing Type Pressure dressing Peripads  Dressing -- Intact  Dressing Change Frequency -- PRN     No Linked orders to display     Incision (Closed) 11/21/15 Abdomen Other (Comment) (Active)  Date First Assessed/Time First Assessed: 11/21/15 1513   Location: Abdomen  Location Orientation: Other (Comment)    Assessments 11/21/2015  4:15 PM 11/22/2015  8:21 PM  Dressing Type Peripads Pressure dressing  Dressing -- Clean;Dry;Intact  Site / Wound Assessment -- Dressing in place / Unable to assess     No Linked orders to display    Discharge Exam:   Vitals:   02/21/21 0520 02/21/21 0526 02/21/21 0615 02/21/21 0744  BP: (!) 147/104 (!) 135/91  (!) 156/103  Pulse: 96 91  (!) 101  Resp:  '18 17 17  '$ Temp: 98.1 F (36.7 C) 98.3 F (36.8 C)  98.1 F (36.7 C)  TempSrc: Oral Oral  Oral  SpO2: 100% 98%  100%  Weight:  60.2 kg    Height:        Body mass index is 22.8 kg/m.  General exam: Pleasant young African-American female.  Not in distress Skin: No rashes, lesions or ulcers. HEENT: Atraumatic, normocephalic, no obvious bleeding Lungs: Clear to auscultation bilaterally CVS: Regular rate and rhythm, no murmur GI/Abd soft, mild diffuse tenderness.  Much better than before. Groin: Bilateral labial swelling persist but improving.  No evidence of cellulitis or underlying collection CNS: Alert, awake, oriented x3 Psychiatry: Mood appropriate Extremities: Improving bilateral pedal edema, no calf tenderness.  Time coordinating  discharge: 35 minutes   The results of significant diagnostics from this hospitalization (including imaging, microbiology, ancillary and laboratory) are listed below for reference.    Procedures and Diagnostic Studies:   CT ABDOMEN PELVIS WO CONTRAST  Result Date: 02/14/2021 CLINICAL DATA:  Acute abdominal pain, nonlocalized. Constipation for 1 week. Enemas without relief. EXAM: CT ABDOMEN AND PELVIS WITHOUT CONTRAST TECHNIQUE: Multidetector CT imaging of the abdomen and pelvis was performed following the standard protocol without IV contrast. COMPARISON:  10/31/2020 FINDINGS: Lower chest: Small to moderate bilateral pleural effusions with airspace disease in the lung bases, possibly pneumonia or edema. Diffuse soft tissue edema in the subcutaneous fat. Hepatobiliary: No focal liver abnormality is seen. No gallstones, gallbladder wall thickening, or biliary dilatation. Pancreas: Unremarkable. No pancreatic ductal dilatation or surrounding inflammatory changes. Spleen: Normal in  size without focal abnormality. Adrenals/Urinary Tract: Adrenal glands are unremarkable. Kidneys are normal, without renal calculi, focal lesion, or hydronephrosis. Bladder is unremarkable. Stomach/Bowel: Stomach, small bowel, and colon are not abnormally distended. Stomach and small bowel are mostly decompressed. Scattered stool throughout the colon with prominent stool in the rectum. No wall thickening or inflammatory changes appreciated. Appendix is normal. Vascular/Lymphatic: Aortic atherosclerosis. No enlarged abdominal or pelvic lymph nodes. Reproductive: Uterus and ovaries are not enlarged. Surgical clips consistent with tubal ligations. Other: Moderate diffuse free fluid throughout the abdomen and pelvis, likely ascites. Diffuse edema throughout the subcutaneous fatty tissues. No free air. Abdominal wall musculature appears intact. Musculoskeletal: No acute or significant osseous findings. IMPRESSION: 1. Bilateral pleural  effusions with airspace disease in the lung bases, possibly pneumonia or edema. 2. Diffuse edema throughout the subcutaneous fatty tissues. 3. Moderate free fluid throughout the abdomen and pelvis, likely ascites. Effusions and edematous changes are new since the previous study. 4. Scattered stool throughout the colon. No evidence of bowel obstruction or inflammation. Electronically Signed   By: Lucienne Capers M.D.   On: 02/14/2021 23:52     Labs:   Basic Metabolic Panel: Recent Labs  Lab 02/17/21 0517 02/18/21 0446 02/19/21 0515 02/20/21 0449 02/21/21 0813  NA 132* 135 135 134* 136  K 4.5 4.6 4.6 4.2 4.3  CL 101 103 102 98 97*  CO2 '24 26 26 27 30  '$ GLUCOSE 160* 117* 323* 150* 209*  BUN 44* 42* 38* 38* 37*  CREATININE 2.26* 2.32* 2.33* 1.96* 2.10*  CALCIUM 7.7* 7.7* 8.2* 8.1* 8.2*  MG 1.8  --   --   --   --   PHOS 4.0  --   --   --   --    GFR Estimated Creatinine Clearance: 33.2 mL/min (A) (by C-G formula based on SCr of 2.1 mg/dL (H)). Liver Function Tests: Recent Labs  Lab 02/14/21 2130  AST 20  ALT 24  ALKPHOS 122  BILITOT 1.2  PROT 5.7*  ALBUMIN 1.8*   Recent Labs  Lab 02/14/21 2130  LIPASE 19   No results for input(s): AMMONIA in the last 168 hours. Coagulation profile No results for input(s): INR, PROTIME in the last 168 hours.  CBC: Recent Labs  Lab 02/17/21 0517 02/17/21 1630 02/18/21 0446 02/19/21 0515 02/20/21 0449 02/21/21 0813  WBC 8.9  --  6.7 7.2 8.2 6.3  NEUTROABS 6.4  --  4.5 5.4 5.6 4.6  HGB 6.9* 10.0* 9.1* 10.0* 10.1* 10.5*  HCT 20.8* 30.0* 27.0* 30.4* 30.7* 32.1*  MCV 88.5  --  89.4 89.9 91.4 90.7  PLT 302  --  316 426* 469* 537*   Cardiac Enzymes: No results for input(s): CKTOTAL, CKMB, CKMBINDEX, TROPONINI in the last 168 hours. BNP: Invalid input(s): POCBNP CBG: Recent Labs  Lab 02/20/21 1146 02/20/21 1732 02/20/21 2029 02/21/21 0326 02/21/21 0747  GLUCAP 194* 84 159* 114* 173*   D-Dimer No results for input(s):  DDIMER in the last 72 hours. Hgb A1c No results for input(s): HGBA1C in the last 72 hours. Lipid Profile No results for input(s): CHOL, HDL, LDLCALC, TRIG, CHOLHDL, LDLDIRECT in the last 72 hours. Thyroid function studies No results for input(s): TSH, T4TOTAL, T3FREE, THYROIDAB in the last 72 hours.  Invalid input(s): FREET3 Anemia work up No results for input(s): VITAMINB12, FOLATE, FERRITIN, TIBC, IRON, RETICCTPCT in the last 72 hours. Microbiology Recent Results (from the past 240 hour(s))  Wet prep, genital     Status: Abnormal  Collection Time: 02/15/21 12:04 AM   Specimen: Vaginal; Genital  Result Value Ref Range Status   Yeast Wet Prep HPF POC NONE SEEN NONE SEEN Final   Trich, Wet Prep NONE SEEN NONE SEEN Final   Clue Cells Wet Prep HPF POC PRESENT (A) NONE SEEN Final   WBC, Wet Prep HPF POC MANY (A) NONE SEEN Final   Sperm NONE SEEN  Final    Comment: Performed at [facility], [place] [address], [place], [place] [address]  Resp Panel by RT-PCR (Flu A&B, Covid) Nasopharyngeal Swab     Status: None   Collection Time: 02/15/21 12:58 AM   Specimen: Nasopharyngeal Swab; Nasopharyngeal(NP) swabs in vial transport medium  Result Value Ref Range Status   SARS Coronavirus 2 by RT PCR NEGATIVE NEGATIVE Final    Comment: (NOTE) SARS-CoV-2 target nucleic acids are NOT DETECTED.  The SARS-CoV-2 RNA is generally detectable in upper respiratory specimens during the acute phase of infection. The lowest concentration of SARS-CoV-2 viral copies this assay can detect is 138 copies/mL. A negative result does not preclude SARS-Cov-2 infection and should not be used as the sole basis for treatment or other patient management decisions. A negative result may occur with  improper specimen collection/handling, submission of specimen other than nasopharyngeal swab, presence of viral mutation(s) within the areas targeted by this assay, and inadequate number of  viral copies(<138 copies/mL). A negative result must be combined with clinical observations, patient history, and epidemiological information. The expected result is Negative.  Fact Sheet for Patients:  EntrepreneurPulse.com.au  Fact Sheet for Healthcare Providers:  IncredibleEmployment.be  This test is no t yet approved or cleared by the [place] FDA and  has been authorized for detection and/or diagnosis of SARS-CoV-2 by FDA under an Emergency Use Authorization (EUA). This EUA will remain  in effect (meaning this test can be used) for the duration of the COVID-19 declaration under Section 564(b)(1) of the Act, 21 U.S.C.section 360bbb-3(b)(1), unless the authorization is terminated  or revoked sooner.       Influenza A by PCR NEGATIVE NEGATIVE Final   Influenza B by PCR NEGATIVE NEGATIVE Final    Comment: (NOTE) The Xpert Xpress SARS-CoV-2/FLU/RSV plus assay is intended as an aid in the diagnosis of influenza from Nasopharyngeal swab specimens and should not be used as a sole basis for treatment. Nasal washings and aspirates are unacceptable for Xpert Xpress SARS-CoV-2/FLU/RSV testing.  Fact Sheet for Patients: EntrepreneurPulse.com.au  Fact Sheet for Healthcare Providers: IncredibleEmployment.be  This test is not yet approved or cleared by the [place] FDA and has been authorized for detection and/or diagnosis of SARS-CoV-2 by FDA under an Emergency Use Authorization (EUA). This EUA will remain in effect (meaning this test can be used) for the duration of the COVID-19 declaration under Section 564(b)(1) of the Act, 21 U.S.C. section 360bbb-3(b)(1), unless the authorization is terminated or revoked.  Performed at [facility], [place] [address] [place],  [address]      Signed: Marlowe Aschoff Ann Robertson  [facility] 02/21/2021, 11:25 AM

## 2021-02-25 NOTE — ED Provider Notes (Signed)
[facility] Provider Note   CSN: [id number] Arrival date & time: 02/14/21  1854     History Chief Complaint  Patient presents with   Constipation    Ann Robertson is a 30 y.o. female.  Patient complains of weakness  The history is provided by the patient and medical records. No language interpreter was used.  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   Neuropathy    Preeclampsia    STD (sexually transmitted disease)     Patient Active Problem List   Diagnosis Date Noted   Anasarca 02/15/2021   Constipation 02/15/2021   Hypoalbuminemia 02/15/2021   Hyperkalemia 02/15/2021   Diabetic nephropathy ([place]) 02/15/2021   Bacterial vaginosis 02/15/2021   Sinus tachycardia 02/15/2021   Generalized edema 01/31/2021   Acute renal failure superimposed on stage 3a chronic kidney disease ([place]) 01/26/2021   Normocytic anemia 01/26/2021   HTN (hypertension) 01/26/2021   Peripheral neuropathy 01/26/2021   Abdominal pain, acute, epigastric 10/30/2020   Abdominal pain 10/30/2020   Nausea 06/10/2020   Foot pain, right 04/05/2018   Postpartum care following cesarean delivery 11/21/2015   Preterm labor 11/16/2015   Noncompliance with diabetes treatment 11/15/2015   Traumatic injury during pregnancy in third trimester 11/14/2015   Maternal chronic hypertension 11/06/2015   Previous cesarean section complicating pregnancy [id number]   Short interval between pregnancies complicating pregnancy, antepartum 07/14/2015   History of preterm delivery, currently pregnant  07/14/2015   History of pre-eclampsia in prior pregnancy, currently pregnant 07/14/2015   Supervision of high risk pregnancy, antepartum 07/14/2015   History of preterm delivery 09/06/2014   History of polyhydramnios 09/06/2014   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    Past Surgical History:  Procedure Laterality Date   CESAREAN SECTION     CESAREAN SECTION N/A 07/05/2013   Procedure: CESAREAN SECTION;  Surgeon: Emily Filbert, MD;  Location: [facility];  Service: Obstetrics;  Laterality: N/A;   CESAREAN SECTION N/A 07/25/2014   Procedure: CESAREAN SECTION;  Surgeon: Donnamae Jude, MD;  Location: [facility];  Service: Obstetrics;  Laterality: N/A;   CESAREAN SECTION N/A 11/21/2015   Procedure: CESAREAN SECTION;  Surgeon: Donnamae Jude, MD;  Location: [facility];  Service: Obstetrics;  Laterality: N/A;   WISDOM TOOTH EXTRACTION       OB History     Gravida  5   Para  4   Term  0   Preterm  4   AB  1   Living  4      SAB  1   IAB  0   Ectopic  0   Multiple  0   Live Births  4           Family History  Problem Relation Age of Onset   Stroke Mother    Hypertension Mother    Heart disease Mother    Kidney disease Mother    Hyperlipidemia Father    Diabetes Father  Social History   Tobacco Use   Smoking status: Every Day    Packs/day: 0.25    Years: 9.00    Pack years: 2.25    Types: Cigarettes   Smokeless tobacco: Never  Substance Use Topics   Alcohol use: Yes    Comment: occasional   Drug use: No    Home Medications Prior to Admission medications   Medication Sig Start Date End Date Taking? Authorizing Provider  cephALEXin (KEFLEX) 500 MG capsule Take 1 capsule (500 mg total) by mouth 3 (three) times daily for 4 days. 02/21/21 02/25/21 Yes Dahal, Marlowe Aschoff, MD  gabapentin (NEURONTIN) 600 MG tablet Take 600 mg by mouth 3 (three) times daily as  needed (pain). 10/28/20  Yes [provider]  insulin glargine (LANTUS SOLOSTAR) 100 UNIT/ML Solostar Pen Inject 5 Units into the skin daily. And pen needles 1/day 02/02/21  Yes Dwyane Dee, MD  insulin lispro (HUMALOG) 100 UNIT/ML KwikPen Inject 2-12 Units into the skin in the morning, at noon, and at bedtime. Per sliding scale   Yes [provider]  metroNIDAZOLE (FLAGYL) 500 MG tablet Take 1 tablet (500 mg total) by mouth 3 (three) times daily for 4 days. 02/21/21 02/25/21 Yes Dahal, Marlowe Aschoff, MD  omeprazole (PRILOSEC) 20 MG capsule Take 20 mg by mouth daily.   Yes [provider]  ondansetron (ZOFRAN) 4 MG tablet Take 1 tablet (4 mg total) by mouth every 8 (eight) hours as needed for nausea or vomiting. 06/10/20  Yes Renato Shin, MD  oxyCODONE-acetaminophen (PERCOCET) 10-325 MG tablet Take 1 tablet by mouth every 4 (four) hours as needed for pain. 02/13/21  Yes [provider]  pantoprazole (PROTONIX) 40 MG tablet Take 1 tablet (40 mg total) by mouth daily. 02/03/21  Yes Dwyane Dee, MD  saccharomyces boulardii (FLORASTOR) 250 MG capsule Take 1 capsule (250 mg total) by mouth 2 (two) times daily for 5 days. 02/21/21 02/26/21 Yes Dahal, Marlowe Aschoff, MD  torsemide (DEMADEX) 20 MG tablet Take 1 tablet (20 mg total) by mouth 2 (two) times daily. 02/21/21 05/22/21 Yes Dahal, Marlowe Aschoff, MD  Vitamin D, Ergocalciferol, (DRISDOL) 1.25 MG (50000 UNIT) CAPS capsule Take 50,000 Units by mouth every Wednesday.   Yes [provider]  bisacodyl (DULCOLAX) 10 MG suppository Place 1 suppository (10 mg total) rectally daily as needed for moderate constipation. 02/21/21   Terrilee Croak, MD  carvedilol (COREG) 25 MG tablet Take 1 tablet (25 mg total) by mouth 2 (two) times daily with a meal. 02/21/21 03/23/21  Dahal, Marlowe Aschoff, MD  Continuous Blood Gluc Receiver (FREESTYLE LIBRE 2 READER) DEVI 1 each by Does not apply route in the morning, at noon, in the evening, and at bedtime. 10/31/20    Brimage, Ronnette Juniper, DO  Insulin Pen Needle (PEN NEEDLES) 30G X 8 MM MISC 1 each by Does not apply route daily. E11.9 05/14/19   Renato Shin, MD  methocarbamol (ROBAXIN) 500 MG tablet Take 1 tablet (500 mg total) by mouth every 8 (eight) hours as needed for up to 5 days for muscle spasms. 02/21/21 02/26/21  Dahal, Marlowe Aschoff, MD  senna-docusate (SENOKOT-S) 8.6-50 MG tablet Take 1 tablet by mouth 2 (two) times daily. 02/21/21 05/22/21  Terrilee Croak, MD    Allergies    Hydromorphone hcl  Review of Systems   Review of Systems  Constitutional:  Negative for appetite change and fatigue.  HENT:  Negative for congestion, ear discharge and sinus pressure.   Eyes:  Negative for discharge.  Respiratory:  Negative for  cough.   Cardiovascular:  Negative for chest pain.  Gastrointestinal:  Negative for abdominal pain and diarrhea.  Genitourinary:  Negative for frequency and hematuria.  Musculoskeletal:  Negative for back pain.  Skin:  Negative for rash.  Neurological:  Positive for weakness. Negative for seizures and headaches.  Psychiatric/Behavioral:  Negative for hallucinations.    Physical Exam Updated Vital Signs BP (!) 148/100 (BP Location: Right Arm)   Pulse (!) 103   Temp 98.4 F (36.9 C) (Oral)   Resp 16   Ht '5\' 4"'$  (1.626 m)   Wt 60.2 kg   SpO2 99%   BMI 22.80 kg/m   Physical Exam Vitals and nursing note reviewed.  Constitutional:      Appearance: She is well-developed.  HENT:     Head: Normocephalic.     Nose: Nose normal.  Eyes:     General: No scleral icterus.    Conjunctiva/sclera: Conjunctivae normal.  Neck:     Thyroid: No thyromegaly.  Cardiovascular:     Rate and Rhythm: Normal rate and regular rhythm.     Heart sounds: No murmur heard.   No friction rub. No gallop.  Pulmonary:     Breath sounds: No stridor. No wheezing or rales.  Chest:     Chest wall: No tenderness.  Abdominal:     General: There is no distension.     Tenderness: There is no abdominal  tenderness. There is no rebound.  Musculoskeletal:        General: Normal range of motion.     Cervical back: Neck supple.     Comments: 3+ edema in leg  Lymphadenopathy:     Cervical: No cervical adenopathy.  Skin:    Findings: No erythema or rash.  Neurological:     Mental Status: She is alert and oriented to person, place, and time.     Motor: No abnormal muscle tone.     Coordination: Coordination normal.  Psychiatric:        Behavior: Behavior normal.    ED Results / Procedures / Treatments   Labs (all labs ordered are listed, but only abnormal results are displayed) Labs Reviewed  WET PREP, GENITAL - Abnormal; Notable for the following components:      Result Value   Clue Cells Wet Prep HPF POC PRESENT (*)    WBC, Wet Prep HPF POC MANY (*)    All other components within normal limits  CBC WITH DIFFERENTIAL/PLATELET - Abnormal; Notable for the following components:   WBC 12.5 (*)    RBC 2.55 (*)    Hemoglobin 7.4 (*)    HCT 23.1 (*)    Neutro Abs 10.8 (*)    All other components within normal limits  COMPREHENSIVE METABOLIC PANEL - Abnormal; Notable for the following components:   Potassium 5.2 (*)    Glucose, Bld 405 (*)    BUN 43 (*)    Creatinine, Ser 1.98 (*)    Calcium 8.5 (*)    Total Protein 5.7 (*)    Albumin 1.8 (*)    GFR, Estimated 34 (*)    All other components within normal limits  BRAIN NATRIURETIC PEPTIDE - Abnormal; Notable for the following components:   B Natriuretic Peptide 122.7 (*)    All other components within normal limits  BASIC METABOLIC PANEL - Abnormal; Notable for the following components:   Sodium 134 (*)    CO2 20 (*)    Glucose, Bld 511 (*)    BUN 49 (*)  Creatinine, Ser 2.10 (*)    Calcium 8.2 (*)    GFR, Estimated 32 (*)    All other components within normal limits  CBC - Abnormal; Notable for the following components:   WBC 11.6 (*)    RBC 2.47 (*)    Hemoglobin 7.3 (*)    HCT 22.4 (*)    All other components within  normal limits  URINALYSIS, ROUTINE W REFLEX MICROSCOPIC - Abnormal; Notable for the following components:   APPearance CLOUDY (*)    Glucose, UA >=500 (*)    Hgb urine dipstick MODERATE (*)    Ketones, ur 20 (*)    Protein, ur >=300 (*)    Bacteria, UA MANY (*)    All other components within normal limits  GLUCOSE, CAPILLARY - Abnormal; Notable for the following components:   Glucose-Capillary 53 (*)    All other components within normal limits  GLUCOSE, CAPILLARY - Abnormal; Notable for the following components:   Glucose-Capillary 58 (*)    All other components within normal limits  BASIC METABOLIC PANEL - Abnormal; Notable for the following components:   Glucose, Bld 168 (*)    BUN 46 (*)    Creatinine, Ser 2.22 (*)    Calcium 8.4 (*)    GFR, Estimated 29 (*)    All other components within normal limits  GLUCOSE, CAPILLARY - Abnormal; Notable for the following components:   Glucose-Capillary 34 (*)    All other components within normal limits  GLUCOSE, CAPILLARY - Abnormal; Notable for the following components:   Glucose-Capillary 105 (*)    All other components within normal limits  GLUCOSE, CAPILLARY - Abnormal; Notable for the following components:   Glucose-Capillary 68 (*)    All other components within normal limits  GLUCOSE, CAPILLARY - Abnormal; Notable for the following components:   Glucose-Capillary 55 (*)    All other components within normal limits  GLUCOSE, CAPILLARY - Abnormal; Notable for the following components:   Glucose-Capillary 113 (*)    All other components within normal limits  GLUCOSE, CAPILLARY - Abnormal; Notable for the following components:   Glucose-Capillary 213 (*)    All other components within normal limits  GLUCOSE, CAPILLARY - Abnormal; Notable for the following components:   Glucose-Capillary 279 (*)    All other components within normal limits  CBC WITH DIFFERENTIAL/PLATELET - Abnormal; Notable for the following components:   WBC  10.7 (*)    RBC 2.43 (*)    Hemoglobin 7.1 (*)    HCT 21.3 (*)    Neutro Abs 8.7 (*)    Abs Immature Granulocytes 0.09 (*)    All other components within normal limits  PROTEIN, URINE, 24 HOUR - Abnormal; Notable for the following components:   Protein, 24H Urine 5,831 (*)    All other components within normal limits  GLUCOSE, CAPILLARY - Abnormal; Notable for the following components:   Glucose-Capillary 397 (*)    All other components within normal limits  GLUCOSE, CAPILLARY - Abnormal; Notable for the following components:   Glucose-Capillary 240 (*)    All other components within normal limits  BASIC METABOLIC PANEL - Abnormal; Notable for the following components:   Sodium 132 (*)    Glucose, Bld 160 (*)    BUN 44 (*)    Creatinine, Ser 2.26 (*)    Calcium 7.7 (*)    GFR, Estimated 29 (*)    All other components within normal limits  CBC WITH DIFFERENTIAL/PLATELET - Abnormal; Notable for  the following components:   RBC 2.35 (*)    Hemoglobin 6.9 (*)    HCT 20.8 (*)    All other components within normal limits  GLUCOSE, CAPILLARY - Abnormal; Notable for the following components:   Glucose-Capillary 175 (*)    All other components within normal limits  GLUCOSE, CAPILLARY - Abnormal; Notable for the following components:   Glucose-Capillary 259 (*)    All other components within normal limits  HEMOGLOBIN AND HEMATOCRIT, BLOOD - Abnormal; Notable for the following components:   Hemoglobin 10.0 (*)    HCT 30.0 (*)    All other components within normal limits  GLUCOSE, CAPILLARY - Abnormal; Notable for the following components:   Glucose-Capillary 60 (*)    All other components within normal limits  BASIC METABOLIC PANEL - Abnormal; Notable for the following components:   Glucose, Bld 117 (*)    BUN 42 (*)    Creatinine, Ser 2.32 (*)    Calcium 7.7 (*)    GFR, Estimated 28 (*)    All other components within normal limits  CBC WITH DIFFERENTIAL/PLATELET - Abnormal;  Notable for the following components:   RBC 3.02 (*)    Hemoglobin 9.1 (*)    HCT 27.0 (*)    All other components within normal limits  GLUCOSE, CAPILLARY - Abnormal; Notable for the following components:   Glucose-Capillary 59 (*)    All other components within normal limits  GLUCOSE, CAPILLARY - Abnormal; Notable for the following components:   Glucose-Capillary 66 (*)    All other components within normal limits  GLUCOSE, CAPILLARY - Abnormal; Notable for the following components:   Glucose-Capillary 127 (*)    All other components within normal limits  BASIC METABOLIC PANEL - Abnormal; Notable for the following components:   Glucose, Bld 323 (*)    BUN 38 (*)    Creatinine, Ser 2.33 (*)    Calcium 8.2 (*)    GFR, Estimated 28 (*)    All other components within normal limits  CBC WITH DIFFERENTIAL/PLATELET - Abnormal; Notable for the following components:   RBC 3.38 (*)    Hemoglobin 10.0 (*)    HCT 30.4 (*)    Platelets 426 (*)    All other components within normal limits  GLUCOSE, CAPILLARY - Abnormal; Notable for the following components:   Glucose-Capillary 131 (*)    All other components within normal limits  GLUCOSE, CAPILLARY - Abnormal; Notable for the following components:   Glucose-Capillary 342 (*)    All other components within normal limits  SEDIMENTATION RATE - Abnormal; Notable for the following components:   Sed Rate 120 (*)    All other components within normal limits  C-REACTIVE PROTEIN - Abnormal; Notable for the following components:   CRP 4.1 (*)    All other components within normal limits  GLUCOSE, CAPILLARY - Abnormal; Notable for the following components:   Glucose-Capillary 320 (*)    All other components within normal limits  CBC WITH DIFFERENTIAL/PLATELET - Abnormal; Notable for the following components:   RBC 3.36 (*)    Hemoglobin 10.1 (*)    HCT 30.7 (*)    Platelets 469 (*)    All other components within normal limits  BASIC  METABOLIC PANEL - Abnormal; Notable for the following components:   Sodium 134 (*)    Glucose, Bld 150 (*)    BUN 38 (*)    Creatinine, Ser 1.96 (*)    Calcium 8.1 (*)  GFR, Estimated 34 (*)    All other components within normal limits  GLUCOSE, CAPILLARY - Abnormal; Notable for the following components:   Glucose-Capillary 126 (*)    All other components within normal limits  GLUCOSE, CAPILLARY - Abnormal; Notable for the following components:   Glucose-Capillary 42 (*)    All other components within normal limits  GLUCOSE, CAPILLARY - Abnormal; Notable for the following components:   Glucose-Capillary 152 (*)    All other components within normal limits  GLUCOSE, CAPILLARY - Abnormal; Notable for the following components:   Glucose-Capillary 140 (*)    All other components within normal limits  GLUCOSE, CAPILLARY - Abnormal; Notable for the following components:   Glucose-Capillary 194 (*)    All other components within normal limits  GLUCOSE, CAPILLARY - Abnormal; Notable for the following components:   Glucose-Capillary 159 (*)    All other components within normal limits  GLUCOSE, CAPILLARY - Abnormal; Notable for the following components:   Glucose-Capillary 114 (*)    All other components within normal limits  GLUCOSE, CAPILLARY - Abnormal; Notable for the following components:   Glucose-Capillary 173 (*)    All other components within normal limits  CBC WITH DIFFERENTIAL/PLATELET - Abnormal; Notable for the following components:   RBC 3.54 (*)    Hemoglobin 10.5 (*)    HCT 32.1 (*)    Platelets 537 (*)    All other components within normal limits  BASIC METABOLIC PANEL - Abnormal; Notable for the following components:   Chloride 97 (*)    Glucose, Bld 209 (*)    BUN 37 (*)    Creatinine, Ser 2.10 (*)    Calcium 8.2 (*)    GFR, Estimated 32 (*)    All other components within normal limits  GLUCOSE, CAPILLARY - Abnormal; Notable for the following components:    Glucose-Capillary 246 (*)    All other components within normal limits  GLUCOSE, CAPILLARY - Abnormal; Notable for the following components:   Glucose-Capillary 160 (*)    All other components within normal limits  CBG MONITORING, ED - Abnormal; Notable for the following components:   Glucose-Capillary 349 (*)    All other components within normal limits  I-STAT BETA HCG BLOOD, ED ([facility], [facility], [facility] ONLY) - Abnormal; Notable for the following components:   I-stat hCG, quantitative 5.7 (*)    All other components within normal limits  CBG MONITORING, ED - Abnormal; Notable for the following components:   Glucose-Capillary 406 (*)    All other components within normal limits  CBG MONITORING, ED - Abnormal; Notable for the following components:   Glucose-Capillary 461 (*)    All other components within normal limits  CBG MONITORING, ED - Abnormal; Notable for the following components:   Glucose-Capillary 363 (*)    All other components within normal limits  CBG MONITORING, ED - Abnormal; Notable for the following components:   Glucose-Capillary 167 (*)    All other components within normal limits  CBG MONITORING, ED - Abnormal; Notable for the following components:   Glucose-Capillary 110 (*)    All other components within normal limits  RESP PANEL BY RT-PCR (FLU A&B, COVID) ARPGX2  LIPASE, BLOOD  GLUCOSE, CAPILLARY  MAGNESIUM  PHOSPHORUS  GLUCOSE, CAPILLARY  CREATININE, URINE, RANDOM  SODIUM, URINE, RANDOM  GLUCOSE, CAPILLARY  GLUCOSE, CAPILLARY  GLUCOSE, CAPILLARY  GLUCOSE, CAPILLARY  GLUCOSE, CAPILLARY  GLUCOSE, CAPILLARY  GLUCOSE, CAPILLARY  TYPE AND SCREEN  PREPARE RBC (CROSSMATCH)  GC/CHLAMYDIA PROBE  AMP ([place]) NOT AT [facility]    EKG None  Radiology No results found.  Procedures Procedures   Medications Ordered in ED Medications  dextrose (GLUTOSE) 40 % oral gel 62 g (62 g Oral Not Given 02/15/21 2327)  dextrose 10 % infusion ( Intravenous New Bag/Given 02/16/21  0159)  morphine 2 MG/ML injection 1 mg (1 mg Intravenous Given 02/19/21 1114)  iohexol (OMNIPAQUE) 9 MG/ML oral solution 500 mL ( Oral Canceled Entry 02/20/21 2020)  morphine 4 MG/ML injection 4 mg (4 mg Intravenous Given 02/14/21 2208)  morphine 4 MG/ML injection 4 mg (4 mg Intravenous Given 02/15/21 0031)  milk and molasses enema (240 mLs Rectal Given 02/15/21 0336)  ondansetron (ZOFRAN) injection 4 mg (4 mg Intravenous Given 02/15/21 0239)  morphine 4 MG/ML injection 4 mg (4 mg Intravenous Given 02/15/21 1738)  furosemide (LASIX) injection 20 mg (20 mg Intravenous Given 02/15/21 0516)  bisacodyl (DULCOLAX) EC tablet 10 mg (10 mg Oral Given 02/15/21 1721)  dextrose 50 % solution 50 mL (25 mLs Intravenous Given 02/16/21 0001)  morphine 4 MG/ML injection 4 mg (4 mg Intravenous Given 02/16/21 0556)  dextrose 50 % solution 25 mL (50 mLs Intravenous Given 02/15/21 2055)  dextrose 50 % solution 12.5 g (12.5 g Intravenous Given 02/15/21 2215)  morphine 2 MG/ML injection 2 mg (2 mg Intravenous Given 02/16/21 2055)  lactulose (CHRONULAC) enema 200 gm (300 mLs Rectal Given 02/16/21 2057)  0.9 %  sodium chloride infusion (Manually program via Guardrails IV Fluids) (0 mLs Intravenous Stopped 02/17/21 1545)  cyclobenzaprine (FLEXERIL) tablet 5 mg (5 mg Oral Given 02/18/21 2111)  furosemide (LASIX) injection 60 mg (60 mg Intravenous Given 02/20/21 1820)    ED Course  I have reviewed the triage vital signs and the nursing notes.  Pertinent labs & imaging results that were available during my care of the patient were reviewed by me and considered in my medical decision making (see chart for details).    MDM Rules/Calculators/A&P                           Patient with anasarca.  She will be admitted to medicine Final Clinical Impression(s) / ED Diagnoses Final diagnoses:  Anasarca  Other ascites  Pleural effusion, bilateral  Chronic abdominal pain    Rx / DC Orders ED Discharge Orders          Ordered     bisacodyl (DULCOLAX) 10 MG suppository  Daily PRN        02/21/21 1123    carvedilol (COREG) 25 MG tablet  2 times daily with meals        02/21/21 1123    cephALEXin (KEFLEX) 500 MG capsule  3 times daily        02/21/21 1123    metroNIDAZOLE (FLAGYL) 500 MG tablet  3 times daily        02/21/21 1123    saccharomyces boulardii (FLORASTOR) 250 MG capsule  2 times daily        02/21/21 1123    torsemide (DEMADEX) 20 MG tablet  2 times daily        02/21/21 1123    methocarbamol (ROBAXIN) 500 MG tablet  Every 8 hours PRN        02/21/21 1123    senna-docusate (SENOKOT-S) 8.6-50 MG tablet  2 times daily        02/21/21 1123    Increase activity slowly  02/21/21 1123    Diet - low sodium heart healthy        02/21/21 1123    Diet Carb Modified        02/21/21 1123    Ambulatory referral to Ophthalmology       Comments: Diabetes mellitus type 1 routine eye exam   02/21/21 1124             Milton Ferguson, MD 02/25/21 2135

## 2021-03-04 DIAGNOSIS — E103553 Type 1 diabetes mellitus with stable proliferative diabetic retinopathy, bilateral: Secondary | ICD-10-CM | POA: Insufficient documentation

## 2021-03-09 DIAGNOSIS — E1065 Type 1 diabetes mellitus with hyperglycemia: Secondary | ICD-10-CM | POA: Insufficient documentation

## 2021-03-09 DIAGNOSIS — E782 Mixed hyperlipidemia: Secondary | ICD-10-CM | POA: Insufficient documentation

## 2021-03-09 DIAGNOSIS — E785 Hyperlipidemia, unspecified: Secondary | ICD-10-CM | POA: Insufficient documentation

## 2021-04-10 ENCOUNTER — Other Ambulatory Visit: Payer: Self-pay

## 2021-04-10 ENCOUNTER — Inpatient Hospital Stay (HOSPITAL_COMMUNITY)
Admission: EM | Admit: 2021-04-10 | Discharge: 2021-04-12 | DRG: 641 | Disposition: A | Discharge status: Home / Self Care | Payer: Medicaid Other | Source: Ambulatory Visit | Attending: Family Medicine | Admitting: Family Medicine

## 2021-04-10 DIAGNOSIS — I160 Hypertensive urgency: Secondary | ICD-10-CM | POA: Diagnosis present

## 2021-04-10 DIAGNOSIS — Z79899 Other long term (current) drug therapy: Secondary | ICD-10-CM

## 2021-04-10 DIAGNOSIS — Z8249 Family history of ischemic heart disease and other diseases of the circulatory system: Secondary | ICD-10-CM

## 2021-04-10 DIAGNOSIS — F17210 Nicotine dependence, cigarettes, uncomplicated: Secondary | ICD-10-CM | POA: Diagnosis present

## 2021-04-10 DIAGNOSIS — R9431 Abnormal electrocardiogram [ECG] [EKG]: Secondary | ICD-10-CM

## 2021-04-10 DIAGNOSIS — Z841 Family history of disorders of kidney and ureter: Secondary | ICD-10-CM

## 2021-04-10 DIAGNOSIS — D631 Anemia in chronic kidney disease: Secondary | ICD-10-CM | POA: Insufficient documentation

## 2021-04-10 DIAGNOSIS — D649 Anemia, unspecified: Secondary | ICD-10-CM | POA: Diagnosis not present

## 2021-04-10 DIAGNOSIS — Z794 Long term (current) use of insulin: Secondary | ICD-10-CM

## 2021-04-10 DIAGNOSIS — Z833 Family history of diabetes mellitus: Secondary | ICD-10-CM

## 2021-04-10 DIAGNOSIS — E8809 Other disorders of plasma-protein metabolism, not elsewhere classified: Secondary | ICD-10-CM | POA: Diagnosis present

## 2021-04-10 DIAGNOSIS — Z20822 Contact with and (suspected) exposure to covid-19: Secondary | ICD-10-CM | POA: Diagnosis present

## 2021-04-10 DIAGNOSIS — Z9114 Patient's other noncompliance with medication regimen: Secondary | ICD-10-CM

## 2021-04-10 DIAGNOSIS — Z862 Personal history of diseases of the blood and blood-forming organs and certain disorders involving the immune mechanism: Secondary | ICD-10-CM | POA: Insufficient documentation

## 2021-04-10 DIAGNOSIS — E876 Hypokalemia: Secondary | ICD-10-CM | POA: Diagnosis not present

## 2021-04-10 DIAGNOSIS — G8929 Other chronic pain: Secondary | ICD-10-CM | POA: Diagnosis not present

## 2021-04-10 DIAGNOSIS — E1021 Type 1 diabetes mellitus with diabetic nephropathy: Secondary | ICD-10-CM | POA: Diagnosis present

## 2021-04-10 DIAGNOSIS — R739 Hyperglycemia, unspecified: Secondary | ICD-10-CM

## 2021-04-10 DIAGNOSIS — N179 Acute kidney failure, unspecified: Secondary | ICD-10-CM | POA: Diagnosis present

## 2021-04-10 DIAGNOSIS — Z885 Allergy status to narcotic agent status: Secondary | ICD-10-CM

## 2021-04-10 DIAGNOSIS — N1832 Chronic kidney disease, stage 3b: Secondary | ICD-10-CM | POA: Diagnosis present

## 2021-04-10 DIAGNOSIS — E1022 Type 1 diabetes mellitus with diabetic chronic kidney disease: Secondary | ICD-10-CM | POA: Diagnosis present

## 2021-04-10 DIAGNOSIS — R197 Diarrhea, unspecified: Secondary | ICD-10-CM | POA: Diagnosis present

## 2021-04-10 DIAGNOSIS — E7800 Pure hypercholesterolemia, unspecified: Secondary | ICD-10-CM | POA: Diagnosis present

## 2021-04-10 DIAGNOSIS — I472 Ventricular tachycardia, unspecified: Secondary | ICD-10-CM | POA: Diagnosis present

## 2021-04-10 DIAGNOSIS — I129 Hypertensive chronic kidney disease with stage 1 through stage 4 chronic kidney disease, or unspecified chronic kidney disease: Secondary | ICD-10-CM | POA: Diagnosis present

## 2021-04-10 DIAGNOSIS — N183 Chronic kidney disease, stage 3 unspecified: Secondary | ICD-10-CM | POA: Diagnosis present

## 2021-04-10 DIAGNOSIS — N189 Chronic kidney disease, unspecified: Secondary | ICD-10-CM | POA: Insufficient documentation

## 2021-04-10 DIAGNOSIS — E1065 Type 1 diabetes mellitus with hyperglycemia: Secondary | ICD-10-CM | POA: Diagnosis present

## 2021-04-10 DIAGNOSIS — I493 Ventricular premature depolarization: Secondary | ICD-10-CM | POA: Diagnosis present

## 2021-04-10 DIAGNOSIS — E103599 Type 1 diabetes mellitus with proliferative diabetic retinopathy without macular edema, unspecified eye: Secondary | ICD-10-CM | POA: Diagnosis present

## 2021-04-10 LAB — COMPREHENSIVE METABOLIC PANEL
ALT: 10 U/L (ref 0–44)
AST: 17 U/L (ref 15–41)
Albumin: 1.5 g/dL — ABNORMAL LOW (ref 3.5–5.0)
Alkaline Phosphatase: 75 U/L (ref 38–126)
Anion gap: 8 (ref 5–15)
BUN: 20 mg/dL (ref 6–20)
CO2: 26 mmol/L (ref 22–32)
Calcium: 7.2 mg/dL — ABNORMAL LOW (ref 8.9–10.3)
Chloride: 100 mmol/L (ref 98–111)
Creatinine, Ser: 2.89 mg/dL — ABNORMAL HIGH (ref 0.44–1.00)
GFR, Estimated: 21 mL/min — ABNORMAL LOW (ref >60)
Glucose, Bld: 258 mg/dL — ABNORMAL HIGH (ref 70–99)
Potassium: <2 mmol/L — CL (ref 3.5–5.1)
Sodium: 134 mmol/L — ABNORMAL LOW (ref 135–145)
Total Bilirubin: 0.4 mg/dL (ref 0.3–1.2)
Total Protein: 4.8 g/dL — ABNORMAL LOW (ref 6.5–8.1)

## 2021-04-10 LAB — CBC WITH DIFFERENTIAL/PLATELET
Abs Immature Granulocytes: 0.02 10*3/uL (ref 0.00–0.07)
Basophils Absolute: 0 10*3/uL (ref 0.0–0.1)
Basophils Relative: 1 %
Eosinophils Absolute: 0.1 10*3/uL (ref 0.0–0.5)
Eosinophils Relative: 2 %
HCT: 21 % — ABNORMAL LOW (ref 36.0–46.0)
Hemoglobin: 7.4 g/dL — ABNORMAL LOW (ref 12.0–15.0)
Immature Granulocytes: 1 %
Lymphocytes Relative: 42 %
Lymphs Abs: 1.8 10*3/uL (ref 0.7–4.0)
MCH: 30 pg (ref 26.0–34.0)
MCHC: 35.2 g/dL (ref 30.0–36.0)
MCV: 85 fL (ref 80.0–100.0)
Monocytes Absolute: 0.2 10*3/uL (ref 0.1–1.0)
Monocytes Relative: 5 %
Neutro Abs: 2.2 10*3/uL (ref 1.7–7.7)
Neutrophils Relative %: 49 %
Platelets: 298 10*3/uL (ref 150–400)
RBC: 2.47 MIL/uL — ABNORMAL LOW (ref 3.87–5.11)
RDW: 14.6 % (ref 11.5–15.5)
WBC: 4.4 10*3/uL (ref 4.0–10.5)
nRBC: 0 % (ref 0.0–0.2)

## 2021-04-10 LAB — PREPARE RBC (CROSSMATCH)

## 2021-04-10 LAB — LIPASE, BLOOD: Lipase: 24 U/L (ref 11–51)

## 2021-04-10 MED ORDER — LABETALOL HCL 5 MG/ML IV SOLN
20.0000 mg | Freq: Once | INTRAVENOUS | Status: AC
  Administered 2021-04-10: 20 mg via INTRAVENOUS

## 2021-04-10 MED ORDER — MAGNESIUM SULFATE 2 GM/50ML IV SOLN
2.0000 g | Freq: Once | INTRAVENOUS | Status: AC
  Administered 2021-04-10: 2 g via INTRAVENOUS
  Filled 2021-04-10: qty 50

## 2021-04-10 MED ORDER — POTASSIUM CHLORIDE CRYS ER 20 MEQ PO TBCR
40.0000 meq | EXTENDED_RELEASE_TABLET | Freq: Once | ORAL | Status: AC
  Administered 2021-04-10: 40 meq via ORAL
  Filled 2021-04-10: qty 2

## 2021-04-10 MED ORDER — ONDANSETRON HCL 4 MG/2ML IJ SOLN
4.0000 mg | Freq: Once | INTRAMUSCULAR | Status: AC
  Administered 2021-04-10: 4 mg via INTRAVENOUS
  Filled 2021-04-10: qty 2

## 2021-04-10 MED ORDER — POTASSIUM CHLORIDE 10 MEQ/100ML IV SOLN
10.0000 meq | INTRAVENOUS | Status: AC
  Administered 2021-04-10: 10 meq via INTRAVENOUS
  Filled 2021-04-10: qty 100

## 2021-04-10 MED ORDER — FENTANYL CITRATE PF 50 MCG/ML IJ SOSY
50.0000 ug | PREFILLED_SYRINGE | Freq: Once | INTRAMUSCULAR | Status: AC
  Administered 2021-04-10: 50 ug via INTRAVENOUS
  Filled 2021-04-10: qty 1

## 2021-04-10 MED ORDER — SODIUM CHLORIDE 0.9 % IV BOLUS
1000.0000 mL | Freq: Once | INTRAVENOUS | Status: AC
Start: 2021-04-10 — End: 2021-04-10
  Administered 2021-04-10: 1000 mL via INTRAVENOUS

## 2021-04-10 MED ORDER — LABETALOL HCL 200 MG PO TABS: 100.0000 mg | ORAL_TABLET | Freq: Once | ORAL | Status: DC

## 2021-04-10 MED ORDER — LABETALOL HCL 5 MG/ML IV SOLN
20.0000 mg | Freq: Once | INTRAVENOUS | Status: DC
  Filled 2021-04-10: qty 4

## 2021-04-10 MED ORDER — SODIUM CHLORIDE 0.9 % IV SOLN
10.0000 mL/h | Freq: Once | INTRAVENOUS | Status: AC
  Administered 2021-04-10: 10 mL/h via INTRAVENOUS

## 2021-04-10 MED ORDER — FUROSEMIDE 10 MG/ML IJ SOLN
60.0000 mg | Freq: Once | INTRAMUSCULAR | Status: AC
  Administered 2021-04-10: 60 mg via INTRAVENOUS
  Filled 2021-04-10: qty 6

## 2021-04-10 MED ORDER — FENTANYL CITRATE PF 50 MCG/ML IJ SOSY
25.0000 ug | PREFILLED_SYRINGE | INTRAMUSCULAR | Status: DC | PRN
  Administered 2021-04-10: 50 ug via INTRAVENOUS
  Filled 2021-04-10: qty 1

## 2021-04-10 NOTE — ED Notes (Signed)
Pt requesting something for nausea . Provider sent a message

## 2021-04-10 NOTE — ED Notes (Signed)
Pt is refusing to have BP cuff on an arm that has an PIV in it. She is requesting that bp cuff be placed on her leg. Spoke with provider and made aware of same

## 2021-04-10 NOTE — ED Provider Notes (Signed)
Medical screening examination/treatment/procedure(s) were conducted as a shared visit with non-physician practitioner(s) and myself.  I personally evaluated the patient during the encounter.  Clinical Impression:   Final diagnoses:  Hypokalemia  Anemia, unspecified type  Hyperglycemia    Patient is a chronically ill-appearing 30 year old female with a known history of chronic renal failure with a creatinine baseline of around 2.3, she has a history of diabetes which is insulin-dependent and is poorly controlled, she states it is not unusual for her to have a glucose that runs around 400.  She took her Lantus and her Humalog today already.  She also reports having chronic anemia and was transfused several weeks ago  She was getting ready to have surgery here coming up and went in for a preoperative lab work today noted to be hypokalemic and anemic and thus it was sent to the emergency department.  The patient has a borderline tachycardia of 105, her abdomen is soft and nontender, she has 2+ pitting edema from the ankles and feet but nothing above that.  Her lungs are clear her mental status is clear, she does appear pale in the conjunctive a and the mucous membranes.  Will need IV fluids, 1 unit of packed red blood cells, magnesium and potassium.  K is too low to detect on last lab panel, getting meds, critically ill  D/w Dr. Myna Hidalgo who will admit  .Critical Care Performed by: Noemi Chapel, MD Authorized by: Noemi Chapel, MD   Critical care provider statement:    Critical care time (minutes):  35   Critical care time was exclusive of:  Separately billable procedures and treating other patients and teaching time   Critical care was necessary to treat or prevent imminent or life-threatening deterioration of the following conditions:  Endocrine crisis (severe anemia)   Critical care was time spent personally by me on the following activities:  Development of treatment plan with patient or  surrogate, discussions with consultants, evaluation of patient's response to treatment, examination of patient, ordering and review of laboratory studies, ordering and review of radiographic studies, ordering and performing treatments and interventions, pulse oximetry, re-evaluation of patient's condition and review of old charts   Care discussed with: admitting provider   Comments:          Noemi Chapel, MD 04/10/21 2252

## 2021-04-10 NOTE — ED Notes (Signed)
Admit provider at bedside at this time 

## 2021-04-10 NOTE — ED Notes (Signed)
Pt on bedside commode having BM at this time

## 2021-04-10 NOTE — ED Notes (Signed)
IV team just arrived

## 2021-04-10 NOTE — ED Notes (Signed)
Pt asking for pain meds. Provider sent a message reference same

## 2021-04-10 NOTE — ED Provider Notes (Addendum)
[facility] EM[facility]GENCY DEPARTMENT Provider Note   CSN: [id number] Arrival date & time: 04/10/21  1623     History Chief Complaint  Patient presents with   Abdominal Pain   Weakness    Ann Robertson is a 30 y.o. female with a past medical history of type 1 diabetes, brought into the emergency department by EMS complaining of generalized abdominal pain onset 3-4 days.  Patient reports that she was seen for her preop appointment this morning with labs drawn.  She was notified at that time that her potassium and hemoglobin were low.  Her glucose was elevated to 433.  Patient took her Lantus and Humalog this morning.  Patient took a dose of enalapril this morning, her last dose of labetalol was last night.  Patient reports she ran out of her labetalol last night.  Patient has associated generalized weakness, diarrhea, and headache.  She has tried prescription oxycodone prior to arrival.  She denies nausea, vomiting, chest pain, shortness of breath, lightheadedness, or dizziness.    The history is provided by the patient. No language interpreter was used.      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   Neuropathy    Preeclampsia    STD (sexually transmitted disease)     Patient Active Problem List   Diagnosis Date Noted   Prolonged QT interval 04/11/2021   Chronic pain 04/11/2021   Acute renal failure superimposed on stage 3 chronic kidney disease, unspecified acute renal failure type, unspecified whether stage 3a or 3b CKD ([place]) 04/10/2021   Anasarca 02/15/2021   Constipation 02/15/2021   Hypoalbuminemia 02/15/2021   Hypokalemia 02/15/2021   Diabetic nephropathy ([place]) 02/15/2021   Bacterial vaginosis 02/15/2021   Sinus tachycardia 02/15/2021   Generalized edema 01/31/2021   Hypertensive urgency 01/28/2021   Acute renal failure superimposed on stage 3a chronic kidney disease ([place]) 01/26/2021    Anemia 01/26/2021   HTN (hypertension) 01/26/2021   Peripheral neuropathy 01/26/2021   Abdominal pain, acute, epigastric 10/30/2020   Abdominal pain 10/30/2020   Nausea 06/10/2020   Foot pain, right 04/05/2018   Postpartum care following cesarean delivery 11/21/2015   Preterm labor 11/16/2015   Noncompliance with diabetes treatment 11/15/2015   Traumatic injury during pregnancy in third trimester 11/14/2015   Maternal chronic hypertension 11/06/2015   Previous cesarean section complicating pregnancy 23/76/2831   Short interval between pregnancies complicating pregnancy, antepartum 07/14/2015   History of preterm delivery, currently pregnant 07/14/2015   History of pre-eclampsia in prior pregnancy, currently pregnant 07/14/2015   Supervision of high risk pregnancy, antepartum 07/14/2015   History of preterm delivery 09/06/2014   History of polyhydramnios 09/06/2014   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[place]) 12/06/2013    Past Surgical History:  Procedure Laterality Date   CESAREAN SECTION     CESAREAN SECTION N/A 07/05/2013   Procedure: CESAREAN SECTION;  Surgeon: Emily Filbert, MD;  Location: [facility];  Service: Obstetrics;  Laterality: N/A;   CESAREAN SECTION N/A 07/25/2014   Procedure: CESAREAN SECTION;  Surgeon: Donnamae Jude, MD;  Location: [facility];  Service: Obstetrics;  Laterality: N/A;   CESAREAN SECTION N/A 11/21/2015   Procedure: CESAREAN SECTION;  Surgeon: Donnamae Jude, MD;  Location: [facility];  Service: Obstetrics;  Laterality: N/A;   WISDOM TOOTH EXTRACTION  OB History     Gravida  5   Para  4   Term  0   Preterm  4   AB  1   Living  4      SAB  1   IAB  0   Ectopic  0   Multiple  0   Live Births  4           Family History  Problem Relation Age of Onset   Stroke Mother    Hypertension Mother    Heart disease Mother    Kidney disease  Mother    Hyperlipidemia Father    Diabetes Father     Social History   Tobacco Use   Smoking status: Every Day    Packs/day: 0.25    Years: 9.00    Pack years: 2.25    Types: Cigarettes   Smokeless tobacco: Never  Substance Use Topics   Alcohol use: Yes    Comment: occasional   Drug use: No    Home Medications Prior to Admission medications   Medication Sig Start Date End Date Taking? Authorizing Provider  bisacodyl (DULCOLAX) 10 MG suppository Place 1 suppository (10 mg total) rectally daily as needed for moderate constipation. 02/21/21  Yes Dahal, Marlowe Aschoff, MD  Continuous Blood Gluc Sensor (DEXCOM G6 SENSOR) MISC Apply topically. 03/12/21  Yes [provider]  Continuous Blood Gluc Sensor (DEXCOM G6 SENSOR) MISC 1 (ONE) EACH AS DIRECTED 03/12/21  Yes [provider]  Continuous Blood Gluc Transmit (DEXCOM G6 TRANSMITTER) MISC USE AS DIRECTED EVERY 3 MONTHS 02/22/21  Yes [provider]  enalapril (VASOTEC) 5 MG tablet Take 5 mg by mouth 2 (two) times daily. 03/18/21  Yes [provider]  gabapentin (NEURONTIN) 600 MG tablet Take 600 mg by mouth 3 (three) times daily as needed (pain). 10/28/20  Yes [provider]  insulin glargine (LANTUS SOLOSTAR) 100 UNIT/ML Solostar Pen Inject 5 Units into the skin daily. And pen needles 1/day 02/02/21  Yes Dwyane Dee, MD  insulin lispro (HUMALOG) 100 UNIT/ML KwikPen Inject 2-12 Units into the skin in the morning, at noon, and at bedtime. Per sliding scale   Yes [provider]  Insulin Pen Needle (PEN NEEDLES) 30G X 8 MM MISC 1 each by Does not apply route daily. E11.9 05/14/19  Yes Renato Shin, MD  labetalol (NORMODYNE) 100 MG tablet Take 100 mg by mouth 2 (two) times daily. 03/26/21  Yes [provider]  methocarbamol (ROBAXIN) 750 MG tablet Take 750 mg by mouth every 8 (eight) hours as needed for muscle spasms. 03/16/21  Yes [provider]  omeprazole (PRILOSEC) 20 MG  capsule Take 20 mg by mouth daily.   Yes [provider]  ondansetron (ZOFRAN) 4 MG tablet Take 1 tablet (4 mg total) by mouth every 8 (eight) hours as needed for nausea or vomiting. 06/10/20  Yes Renato Shin, MD  oxyCODONE-acetaminophen (PERCOCET) 10-325 MG tablet Take 1 tablet by mouth every 4 (four) hours as needed for pain. 02/13/21  Yes [provider]  rosuvastatin (CRESTOR) 20 MG tablet Take 20 mg by mouth at bedtime. 03/16/21  Yes [provider]  torsemide (DEMADEX) 20 MG tablet Take 1 tablet (20 mg total) by mouth 2 (two) times daily. 02/21/21 05/22/21 Yes Dahal, Marlowe Aschoff, MD  Vitamin D, Ergocalciferol, (DRISDOL) 1.25 MG (50000 UNIT) CAPS capsule Take 50,000 Units by mouth every Tuesday.   Yes [provider]  carvedilol (COREG) 25 MG tablet Take 1  tablet (25 mg total) by mouth 2 (two) times daily with a meal. Patient not taking: Reported on 04/10/2021 02/21/21 03/23/21  Terrilee Croak, MD  Continuous Blood Gluc Receiver (FREESTYLE LIBRE 2 READER) DEVI 1 each by Does not apply route in the morning, at noon, in the evening, and at bedtime. Patient not taking: Reported on 04/10/2021 10/31/20   Lyndee Hensen, DO  pantoprazole (PROTONIX) 40 MG tablet Take 1 tablet (40 mg total) by mouth daily. Patient not taking: Reported on 04/10/2021 02/03/21   Dwyane Dee, MD  senna-docusate (SENOKOT-S) 8.6-50 MG tablet Take 1 tablet by mouth 2 (two) times daily. Patient not taking: No sig reported 02/21/21 05/22/21  Terrilee Croak, MD    Allergies    Hydromorphone hcl  Review of Systems   Review of Systems  Constitutional:  Negative for chills and fever.  Respiratory:  Negative for shortness of breath.   Cardiovascular:  Negative for chest pain.  Gastrointestinal:  Positive for abdominal pain and diarrhea. Negative for nausea and vomiting.  Skin:  Negative for rash.  Neurological:  Positive for headaches (resolved). Negative for dizziness and light-headedness.  All  other systems reviewed and are negative.  Physical Exam Updated Vital Signs BP (!) 194/129   Pulse (!) 105   Temp 97.7 F (36.5 C) (Oral)   Resp 18   SpO2 100%   Physical Exam Vitals and nursing note reviewed.  Constitutional:      General: She is not in acute distress.    Appearance: She is ill-appearing. She is not diaphoretic.     Comments: Chronically ill-appearing.  HENT:     Head: Normocephalic and atraumatic.     Right Ear: Tympanic membrane, ear canal and external ear normal.     Left Ear: Tympanic membrane, ear canal and external ear normal.     Mouth/Throat:     Pharynx: No oropharyngeal exudate.  Eyes:     General: No scleral icterus.    Conjunctiva/sclera: Conjunctivae normal.  Cardiovascular:     Rate and Rhythm: Regular rhythm. Tachycardia present.     Pulses: Normal pulses.     Heart sounds: Normal heart sounds.  Pulmonary:     Effort: Pulmonary effort is normal. No respiratory distress.     Breath sounds: Normal breath sounds. No wheezing.  Abdominal:     General: Bowel sounds are normal.     Palpations: Abdomen is soft. There is no mass.     Tenderness: There is generalized abdominal tenderness. There is no guarding or rebound.     Comments: Generalized abdominal tenderness to palpation.  Musculoskeletal:        General: Normal range of motion.     Cervical back: Normal range of motion and neck supple.     Comments: Strength and sensation intact to bilateral upper and lower extremities.  2+ pitting edema bilaterally to extremities to the level of the ankle. No C, T, L, S spinous tenderness to palpation.  No obvious step-offs or deformities.   Skin:    General: Skin is warm and dry.  Neurological:     Mental Status: She is alert and oriented to person, place, and time.     Cranial Nerves: No cranial nerve deficit.     Sensory: Sensation is intact. No sensory deficit.  Psychiatric:        Behavior: Behavior normal.    ED Results / Procedures /  Treatments   Labs (all labs ordered are listed, but only abnormal results are displayed)  Labs Reviewed  CBC WITH DIFFERENTIAL/PLATELET - Abnormal; Notable for the following components:      Result Value   RBC 2.47 (*)    Hemoglobin 7.4 (*)    HCT 21.0 (*)    All other components within normal limits  COMPREHENSIVE METABOLIC PANEL - Abnormal; Notable for the following components:   Sodium 134 (*)    Potassium <2.0 (*)    Glucose, Bld 258 (*)    Creatinine, Ser 2.89 (*)    Calcium 7.2 (*)    Total Protein 4.8 (*)    Albumin 1.5 (*)    GFR, Estimated 21 (*)    All other components within normal limits  RESP PANEL BY RT-PCR (FLU A&B, COVID) ARPGX2  LIPASE, BLOOD  PREGNANCY, URINE  URINALYSIS, ROUTINE W REFLEX MICROSCOPIC  RAPID URINE DRUG SCREEN, HOSP PERFORMED  OCCULT BLOOD X 1 CARD TO LAB, STOOL  SODIUM, URINE, RANDOM  CREATININE, URINE, RANDOM  UREA NITROGEN, URINE  HEMOGLOBIN P3A  BASIC METABOLIC PANEL  BASIC METABOLIC PANEL  MAGNESIUM  ALDOSTERONE + RENIN ACTIVITY W/ RATIO  CK  PREPARE RBC (CROSSMATCH)  TYPE AND SCREEN    EKG EKG Interpretation  Date/Time:  Friday April 10 2021 18:28:51 EST Ventricular Rate:  100 PR Interval:  152 QRS Duration: 71 QT Interval:  419 QTC Calculation: 541 R Axis:   99 Text Interpretation: Sinus tachycardia Borderline right axis deviation Low voltage, precordial leads Abnormal lateral Q waves Prolonged QT interval Confirmed by Noemi Chapel [phone]) on 04/10/2021 8:32:53 PM  Radiology No results found.  Procedures Procedures   Medications Ordered in ED Medications  potassium chloride 10 mEq in 100 mL IVPB (10 mEq Intravenous Not Given 04/11/21 0134)  labetalol (NORMODYNE) tablet 100 mg (has no administration in time range)  rosuvastatin (CRESTOR) tablet 20 mg (has no administration in time range)  insulin glargine-yfgn (SEMGLEE) injection 5 Units (has no administration in time range)  pantoprazole (PROTONIX) EC tablet 40  mg (has no administration in time range)  senna-docusate (Senokot-S) tablet 1 tablet (has no administration in time range)  gabapentin (NEURONTIN) tablet 600 mg (has no administration in time range)  methocarbamol (ROBAXIN) tablet 750 mg (has no administration in time range)  insulin aspart (novoLOG) injection 0-9 Units (has no administration in time range)  insulin aspart (novoLOG) injection 0-5 Units (has no administration in time range)  sodium chloride flush (NS) 0.9 % injection 3 mL (has no administration in time range)  acetaminophen (TYLENOL) tablet 650 mg (has no administration in time range)    Or  acetaminophen (TYLENOL) suppository 650 mg (has no administration in time range)  0.9 %  sodium chloride infusion (Manually program via Guardrails IV Fluids) (has no administration in time range)  hydrALAZINE (APRESOLINE) tablet 25 mg (has no administration in time range)  0.9 % NaCl with KCl 20 mEq/ L  infusion (has no administration in time range)  oxyCODONE-acetaminophen (PERCOCET/ROXICET) 5-325 MG per tablet 1 tablet (has no administration in time range)    And  oxyCODONE (Oxy IR/ROXICODONE) immediate release tablet 5 mg (has no administration in time range)  sodium chloride 0.9 % bolus 1,000 mL (0 mLs Intravenous Stopped 04/10/21 2205)  magnesium sulfate IVPB 2 g 50 mL (0 g Intravenous Stopped 04/10/21 2205)  0.9 %  sodium chloride infusion (0 mL/hr Intravenous Stopped 04/11/21 0133)  furosemide (LASIX) injection 60 mg (60 mg Intravenous Given 04/10/21 2035)  labetalol (NORMODYNE) injection 20 mg (20 mg Intravenous Given 04/10/21 2032)  ondansetron (ZOFRAN)  injection 4 mg (4 mg Intravenous Given 04/10/21 2032)  fentaNYL (SUBLIMAZE) injection 50 mcg (50 mcg Intravenous Given 04/10/21 2127)  potassium chloride 10 mEq in 100 mL IVPB (0 mEq Intravenous Stopped 04/11/21 0117)  potassium chloride SA (KLOR-CON) CR tablet 40 mEq (40 mEq Oral Given 04/10/21 2323)    ED Course  I have  reviewed the triage vital signs and the nursing notes.  Pertinent labs & imaging results that were available during my care of the patient were reviewed by me and considered in my medical decision making (see chart for details).   Clinical Course as of 04/11/21 0254  Ludwig Clarks Apr 10, 2021  2106 Patient requesting pain meds at this time.  Discussed with patient regarding menstrual status. Patient LMP was 6-8 months ago, she normally has regular cycles.  She is not on oral contraceptives.  Patient denies pregnancy at this time.  Patient has been taking oxycodone with her most recent dose today.  Will order pain medication.  [SB]  2214 Patient re-evaluated and resting comfortably on stretcher. Discussed with patient decreased potassium finding. Patient verbalizes understanding [SB]  2318 RN informed me that patient wanted to discuss admission.  Discussed with patient regarding critical potassium level and admission.  Discussed with patient regarding complications that come with decreased potassium levels including cardiac arrhythmia, cardiac arrest.  Patient reports that she would like a second opinion. [SB]  2323 Hospitalist at bedside. Pt agreeable to admission at this time. [SB]    Clinical Course User Index [SB] Gasper Hopes A, PA-C   MDM Rules/Calculators/A&P                          Patient presents to the ED with generalized abdominal pain x 3-4 days.  Patient also found to be hypokalemic at 2.9 and anemic at 7.1 at preop appointment today.  Patient with recent diarrhea and recent diuretic use during previous ED visit and admission in September, 2022. Patient has history of type 1 diabetes is compliant with her medications.  On exam patient with diffuse abdominal tenderness to palpation. Patient with 2+ pitting edema to the level of the ankles bilaterally.  Patient slightly tachycardic on exam.  EKG without acute ST/T changes.  Potassium found to be <2.0 in the ED.  Hemoglobin decreased 7.4.   Urinalysis, UDS, urine preg, ordered and pending. Patient given IV fluids, zofran, Lasix, labetalol, and fentanyl.  Patient with symptomatic anemia, given 1 unit of pRBCs. Potassium and magnesium repleted in the ED.   Patient with elevated blood pressure of 200/119 on arrival to the ED. Patient takes enalapril and labetalol. She last took enalapril this morning and her last dose of labetalol last night. Patient with resolved headache.  Patient slightly tachycardic and with diffuse abdominal tenderness.  No acute respiratory findings. Normal neurological exam.  Differential diagnosis includes Hypertensive urgency, essential HTN, SAH, CVA. Patient with bilateral lower extremity swelling to the level of the ankle.  Normal neurologic exam. Patient has missed doses of her diuretic. Patient given labetalol while in the ED. Repeat vital signs with blood pressure improved to 178/105. Patient resting comfortably on stretcher.    Consult placed to hospitalist due to hypokalemia and decreased hemoglobin levels.  Hospitalist recommends admission at this time.  Patient acknowledges and verbalizes understanding and is agreeable to plan.  Final Clinical Impression(s) / ED Diagnoses Final diagnoses:  Hypokalemia  Anemia, unspecified type  Hyperglycemia    Rx / DC Orders ED  Discharge Orders     None        Leo Weyandt A, PA-C 04/11/21 0024    Hristopher Missildine A, PA-C 04/11/21 0204    Adriauna Campton A, PA-C 04/11/21 0277    Noemi Chapel, MD 04/11/21 [phone]

## 2021-04-10 NOTE — ED Notes (Signed)
Pt placed on bedside commode to have a BM at this time

## 2021-04-10 NOTE — ED Notes (Signed)
Received verbal report at this time from [place] at this time

## 2021-04-10 NOTE — ED Triage Notes (Addendum)
Pt from home BIB EMS for c/o generalized weakness and generalized ABD pain x 3-4 days. Pt took prescribed oxycodone PTA. Denies any pain now. Pt has blood drawn recently and was sent here due to low K+ and Hgb. Pt unsure if stool color has changed due to vision issues. Pt took all of her medicines today except for her labetalol.   CBG 433 199/108 100% RA RR 18

## 2021-04-10 NOTE — ED Notes (Signed)
Pt currently refusing the covid test. Stated that she hasn't agreed to be admit at this time and wanted to speak with provider. Pt is also asking for more pain medication. Provider made aware of same and is at bedside at this time

## 2021-04-10 NOTE — ED Notes (Signed)
2 RNs attempted to start IV access and obtain bloodwork with no success. IV team consulted.

## 2021-04-11 ENCOUNTER — Encounter (HOSPITAL_COMMUNITY): Payer: Self-pay | Admitting: Family Medicine

## 2021-04-11 DIAGNOSIS — E103599 Type 1 diabetes mellitus with proliferative diabetic retinopathy without macular edema, unspecified eye: Secondary | ICD-10-CM | POA: Diagnosis present

## 2021-04-11 DIAGNOSIS — I129 Hypertensive chronic kidney disease with stage 1 through stage 4 chronic kidney disease, or unspecified chronic kidney disease: Secondary | ICD-10-CM | POA: Diagnosis present

## 2021-04-11 DIAGNOSIS — E1065 Type 1 diabetes mellitus with hyperglycemia: Secondary | ICD-10-CM | POA: Diagnosis present

## 2021-04-11 DIAGNOSIS — E1022 Type 1 diabetes mellitus with diabetic chronic kidney disease: Secondary | ICD-10-CM | POA: Diagnosis present

## 2021-04-11 DIAGNOSIS — N1832 Chronic kidney disease, stage 3b: Secondary | ICD-10-CM | POA: Diagnosis present

## 2021-04-11 DIAGNOSIS — I160 Hypertensive urgency: Secondary | ICD-10-CM | POA: Diagnosis present

## 2021-04-11 DIAGNOSIS — G8929 Other chronic pain: Secondary | ICD-10-CM | POA: Diagnosis present

## 2021-04-11 DIAGNOSIS — Z9114 Patient's other noncompliance with medication regimen: Secondary | ICD-10-CM | POA: Diagnosis not present

## 2021-04-11 DIAGNOSIS — D631 Anemia in chronic kidney disease: Secondary | ICD-10-CM | POA: Diagnosis present

## 2021-04-11 DIAGNOSIS — Z885 Allergy status to narcotic agent status: Secondary | ICD-10-CM | POA: Diagnosis not present

## 2021-04-11 DIAGNOSIS — Z8249 Family history of ischemic heart disease and other diseases of the circulatory system: Secondary | ICD-10-CM | POA: Diagnosis not present

## 2021-04-11 DIAGNOSIS — R197 Diarrhea, unspecified: Secondary | ICD-10-CM | POA: Diagnosis present

## 2021-04-11 DIAGNOSIS — Z794 Long term (current) use of insulin: Secondary | ICD-10-CM | POA: Diagnosis not present

## 2021-04-11 DIAGNOSIS — Z79899 Other long term (current) drug therapy: Secondary | ICD-10-CM | POA: Diagnosis not present

## 2021-04-11 DIAGNOSIS — Z841 Family history of disorders of kidney and ureter: Secondary | ICD-10-CM | POA: Diagnosis not present

## 2021-04-11 DIAGNOSIS — D649 Anemia, unspecified: Secondary | ICD-10-CM | POA: Diagnosis not present

## 2021-04-11 DIAGNOSIS — N179 Acute kidney failure, unspecified: Secondary | ICD-10-CM | POA: Diagnosis present

## 2021-04-11 DIAGNOSIS — E7800 Pure hypercholesterolemia, unspecified: Secondary | ICD-10-CM | POA: Diagnosis present

## 2021-04-11 DIAGNOSIS — I493 Ventricular premature depolarization: Secondary | ICD-10-CM | POA: Diagnosis present

## 2021-04-11 DIAGNOSIS — R9431 Abnormal electrocardiogram [ECG] [EKG]: Secondary | ICD-10-CM | POA: Diagnosis present

## 2021-04-11 DIAGNOSIS — Z20822 Contact with and (suspected) exposure to covid-19: Secondary | ICD-10-CM | POA: Diagnosis present

## 2021-04-11 DIAGNOSIS — Z833 Family history of diabetes mellitus: Secondary | ICD-10-CM | POA: Diagnosis not present

## 2021-04-11 DIAGNOSIS — E876 Hypokalemia: Secondary | ICD-10-CM | POA: Diagnosis present

## 2021-04-11 DIAGNOSIS — I472 Ventricular tachycardia, unspecified: Secondary | ICD-10-CM | POA: Diagnosis present

## 2021-04-11 DIAGNOSIS — F17210 Nicotine dependence, cigarettes, uncomplicated: Secondary | ICD-10-CM | POA: Diagnosis present

## 2021-04-11 DIAGNOSIS — E8809 Other disorders of plasma-protein metabolism, not elsewhere classified: Secondary | ICD-10-CM | POA: Diagnosis present

## 2021-04-11 LAB — BASIC METABOLIC PANEL
Anion gap: 6 (ref 5–15)
Anion gap: 8 (ref 5–15)
BUN: 18 mg/dL (ref 6–20)
BUN: 18 mg/dL (ref 6–20)
CO2: 25 mmol/L (ref 22–32)
CO2: 22 mmol/L (ref 22–32)
Calcium: 6.9 mg/dL — ABNORMAL LOW (ref 8.9–10.3)
Calcium: 7.1 mg/dL — ABNORMAL LOW (ref 8.9–10.3)
Chloride: 104 mmol/L (ref 98–111)
Chloride: 106 mmol/L (ref 98–111)
Creatinine, Ser: 2.8 mg/dL — ABNORMAL HIGH (ref 0.44–1.00)
Creatinine, Ser: 2.89 mg/dL — ABNORMAL HIGH (ref 0.44–1.00)
GFR, Estimated: 22 mL/min — ABNORMAL LOW (ref >60)
GFR, Estimated: 21 mL/min — ABNORMAL LOW (ref >60)
Glucose, Bld: 329 mg/dL — ABNORMAL HIGH (ref 70–99)
Glucose, Bld: 159 mg/dL — ABNORMAL HIGH (ref 70–99)
Potassium: 2.6 mmol/L — CL (ref 3.5–5.1)
Potassium: 3 mmol/L — ABNORMAL LOW (ref 3.5–5.1)
Sodium: 135 mmol/L (ref 135–145)
Sodium: 136 mmol/L (ref 135–145)

## 2021-04-11 LAB — CBC
HCT: 29.7 % — ABNORMAL LOW (ref 36.0–46.0)
Hemoglobin: 9.1 g/dL — ABNORMAL LOW (ref 12.0–15.0)
MCH: 27.3 pg (ref 26.0–34.0)
MCHC: 30.6 g/dL (ref 30.0–36.0)
MCV: 89.2 fL (ref 80.0–100.0)
Platelets: 480 10*3/uL — ABNORMAL HIGH (ref 150–400)
RBC: 3.33 MIL/uL — ABNORMAL LOW (ref 3.87–5.11)
RDW: 14.8 % (ref 11.5–15.5)
WBC: 11.3 10*3/uL — ABNORMAL HIGH (ref 4.0–10.5)
nRBC: 0 % (ref 0.0–0.2)

## 2021-04-11 LAB — CK: Total CK: 166 U/L (ref 38–234)

## 2021-04-11 LAB — GLUCOSE, CAPILLARY
Glucose-Capillary: 284 mg/dL — ABNORMAL HIGH (ref 70–99)
Glucose-Capillary: 310 mg/dL — ABNORMAL HIGH (ref 70–99)
Glucose-Capillary: 329 mg/dL — ABNORMAL HIGH (ref 70–99)
Glucose-Capillary: 159 mg/dL — ABNORMAL HIGH (ref 70–99)
Glucose-Capillary: 161 mg/dL — ABNORMAL HIGH (ref 70–99)

## 2021-04-11 LAB — RESP PANEL BY RT-PCR (FLU A&B, COVID) ARPGX2
Influenza A by PCR: NEGATIVE
Influenza B by PCR: NEGATIVE
SARS Coronavirus 2 by RT PCR: NEGATIVE

## 2021-04-11 LAB — TYPE AND SCREEN
ABO/RH(D): O POS
Antibody Screen: NEGATIVE
Unit division: 0

## 2021-04-11 LAB — URINALYSIS, ROUTINE W REFLEX MICROSCOPIC
Bilirubin Urine: NEGATIVE
Glucose, UA: 150 mg/dL — AB
Ketones, ur: NEGATIVE mg/dL
Leukocytes,Ua: NEGATIVE
Nitrite: NEGATIVE
Protein, ur: 100 mg/dL — AB
Specific Gravity, Urine: 1.008 (ref 1.005–1.030)
pH: 6 (ref 5.0–8.0)

## 2021-04-11 LAB — BPAM RBC
Blood Product Expiration Date: 202212052359
ISSUE DATE / TIME: 202211112321
Unit Type and Rh: 5100

## 2021-04-11 LAB — PREGNANCY, URINE: Preg Test, Ur: NEGATIVE

## 2021-04-11 LAB — RAPID URINE DRUG SCREEN, HOSP PERFORMED
Amphetamines: NOT DETECTED
Barbiturates: NOT DETECTED
Benzodiazepines: NOT DETECTED
Cocaine: NOT DETECTED
Opiates: NOT DETECTED
Tetrahydrocannabinol: NOT DETECTED

## 2021-04-11 LAB — CREATININE, URINE, RANDOM: Creatinine, Urine: 16.82 mg/dL

## 2021-04-11 LAB — SODIUM, URINE, RANDOM: Sodium, Ur: 110 mmol/L

## 2021-04-11 LAB — MAGNESIUM: Magnesium: 1.9 mg/dL (ref 1.7–2.4)

## 2021-04-11 MED ORDER — PANTOPRAZOLE SODIUM 40 MG PO TBEC
40.0000 mg | DELAYED_RELEASE_TABLET | Freq: Every day | ORAL | Status: DC
  Administered 2021-04-11 – 2021-04-12 (×2): 40 mg via ORAL
  Filled 2021-04-11 (×2): qty 1

## 2021-04-11 MED ORDER — METHOCARBAMOL 500 MG PO TABS: 750.0000 mg | ORAL_TABLET | Freq: Three times a day (TID) | ORAL | Status: DC | PRN

## 2021-04-11 MED ORDER — GABAPENTIN 600 MG PO TABS: 600.0000 mg | ORAL_TABLET | Freq: Two times a day (BID) | ORAL | Status: DC | PRN

## 2021-04-11 MED ORDER — INSULIN ASPART 100 UNIT/ML IJ SOLN: 0.0000 [IU] | Freq: Every day | INTRAMUSCULAR | Status: DC

## 2021-04-11 MED ORDER — OXYCODONE-ACETAMINOPHEN 5-325 MG PO TABS
1.0000 | ORAL_TABLET | ORAL | Status: DC | PRN
  Administered 2021-04-11 – 2021-04-12 (×4): 1 via ORAL
  Filled 2021-04-11 (×4): qty 1

## 2021-04-11 MED ORDER — OXYCODONE-ACETAMINOPHEN 10-325 MG PO TABS: 1.0000 | ORAL_TABLET | ORAL | Status: DC | PRN

## 2021-04-11 MED ORDER — POTASSIUM CHLORIDE CRYS ER 20 MEQ PO TBCR
40.0000 meq | EXTENDED_RELEASE_TABLET | ORAL | Status: AC
  Administered 2021-04-11 (×2): 40 meq via ORAL
  Filled 2021-04-11 (×2): qty 2

## 2021-04-11 MED ORDER — INSULIN ASPART 100 UNIT/ML IJ SOLN
4.0000 [IU] | Freq: Once | INTRAMUSCULAR | Status: AC
  Administered 2021-04-11: 4 [IU] via SUBCUTANEOUS

## 2021-04-11 MED ORDER — OXYCODONE HCL 5 MG PO TABS
5.0000 mg | ORAL_TABLET | ORAL | Status: DC | PRN
  Administered 2021-04-11 – 2021-04-12 (×5): 5 mg via ORAL
  Filled 2021-04-11 (×5): qty 1

## 2021-04-11 MED ORDER — LABETALOL HCL 200 MG PO TABS
100.0000 mg | ORAL_TABLET | Freq: Two times a day (BID) | ORAL | Status: DC
  Administered 2021-04-11 – 2021-04-12 (×4): 100 mg via ORAL
  Filled 2021-04-11 (×4): qty 1

## 2021-04-11 MED ORDER — AMLODIPINE BESYLATE 10 MG PO TABS
10.0000 mg | ORAL_TABLET | Freq: Every day | ORAL | Status: DC
  Administered 2021-04-11 – 2021-04-12 (×2): 10 mg via ORAL
  Filled 2021-04-11 (×2): qty 1

## 2021-04-11 MED ORDER — HYDRALAZINE HCL 25 MG PO TABS: 25.0000 mg | ORAL_TABLET | Freq: Four times a day (QID) | ORAL | Status: DC | PRN

## 2021-04-11 MED ORDER — LOPERAMIDE HCL 2 MG PO CAPS
2.0000 mg | ORAL_CAPSULE | Freq: Two times a day (BID) | ORAL | Status: DC
  Administered 2021-04-11 – 2021-04-12 (×3): 2 mg via ORAL
  Filled 2021-04-11 (×3): qty 1

## 2021-04-11 MED ORDER — SODIUM CHLORIDE 0.9% IV SOLUTION: Freq: Once | INTRAVENOUS | Status: DC

## 2021-04-11 MED ORDER — ACETAMINOPHEN 325 MG PO TABS: 650.0000 mg | ORAL_TABLET | Freq: Four times a day (QID) | ORAL | Status: DC | PRN

## 2021-04-11 MED ORDER — ROSUVASTATIN CALCIUM 20 MG PO TABS
20.0000 mg | ORAL_TABLET | Freq: Every day | ORAL | Status: DC
  Administered 2021-04-11 (×2): 20 mg via ORAL
  Filled 2021-04-11 (×2): qty 1

## 2021-04-11 MED ORDER — INSULIN GLARGINE-YFGN 100 UNIT/ML ~~LOC~~ SOLN
5.0000 [IU] | Freq: Every day | SUBCUTANEOUS | Status: DC
  Administered 2021-04-11 – 2021-04-12 (×2): 5 [IU] via SUBCUTANEOUS
  Filled 2021-04-11 (×2): qty 0.05

## 2021-04-11 MED ORDER — SENNOSIDES-DOCUSATE SODIUM 8.6-50 MG PO TABS
1.0000 | ORAL_TABLET | Freq: Two times a day (BID) | ORAL | Status: DC
  Filled 2021-04-11: qty 1

## 2021-04-11 MED ORDER — POTASSIUM CHLORIDE IN NACL 20-0.9 MEQ/L-% IV SOLN
INTRAVENOUS | Status: DC
  Filled 2021-04-11: qty 1000

## 2021-04-11 MED ORDER — INSULIN ASPART 100 UNIT/ML IJ SOLN: 0.0000 [IU] | Freq: Three times a day (TID) | INTRAMUSCULAR | Status: DC

## 2021-04-11 MED ORDER — ACETAMINOPHEN 650 MG RE SUPP: 650.0000 mg | Freq: Four times a day (QID) | RECTAL | Status: DC | PRN

## 2021-04-11 MED ORDER — MAGNESIUM SULFATE 2 GM/50ML IV SOLN
2.0000 g | Freq: Once | INTRAVENOUS | Status: AC
  Administered 2021-04-11: 2 g via INTRAVENOUS
  Filled 2021-04-11: qty 50

## 2021-04-11 MED ORDER — SODIUM CHLORIDE 0.9% FLUSH
3.0000 mL | Freq: Two times a day (BID) | INTRAVENOUS | Status: DC
  Administered 2021-04-11 – 2021-04-12 (×3): 3 mL via INTRAVENOUS

## 2021-04-11 MED ORDER — HYDRALAZINE HCL 25 MG PO TABS
25.0000 mg | ORAL_TABLET | Freq: Three times a day (TID) | ORAL | Status: DC
  Administered 2021-04-11 – 2021-04-12 (×3): 25 mg via ORAL
  Filled 2021-04-11 (×3): qty 1

## 2021-04-11 NOTE — Progress Notes (Signed)
PROGRESS NOTE  Ann Robertson  XTG:[id number] DOB: Mar 02, 1991 DOA: 04/10/2021 PCP: [facility] [place]  Outpatient Specialists: [facility] Brief Narrative: Ann Robertson is a 30 y.o. female with a history of uncontrolled T1DM, stage IIIb CKD, anemia of CKD, HTN, leg swelling on torsemide, and chronic pain who presented to the ED due to lab abnormalities. She has proliferative diabetic retinopathy with vitreous hemorrhage for which pars plana vitrectomy is scheduled 10/15. During a preoperative appointment, labs were drawn and later resulted showing hypokalemia (K 2.6) and anemia (hgb 7.1g/dl) for which she was referred to the ED. She'd also had diarrhea and felt weak for several days, though was taking medications including torsemide. She was afebrile, hypertensive (200/120) with hypokalemia confirmed (undetectable) and anemia stable at 7.4g/dl. Creatinine above baseline at 2.89, QTc 552msec. Normal saline bolus, potassium and magnesium supplementation were given, 1u PRBCs ordered for symptomatic anemia, and lasix 40mg  IV administered prior to admission. Continued electrolyte supplementation has continued with mild improvement, though patient remains symptomatically hypokalemic with prolonged QT interval and frequent PVCs for which continued potassium and magnesium supplementation are ordered. Hemoglobin has increased as expected and there has been no bleeding. Antihypertensive regimen is being augmented.   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  - Up to 2.6 with aggressive supplementation  thus far. Will repeat 61mEq po supplement and recheck in PM. Given ECG changes, will also stay on telemetry, supplement magnesium.   T1DM: Followed by endocrinology in [facility]. Poorly controlled (recent HbA1c 12%, was >15% April 2022, and most values >10% dating back to 2014). Hyperglycemic without acidosis or anion gap on arrival and has remained hyperglycemic, largely due to the patient not allowing larger doses of insulin due to fear of hypoglycemia.  - Will not make too big of a deal out of hyperglycemia acutely in absence of DKA, especially since average blood sugar appears to be ~300mg /dl. Will order 4u novolog as requested by patient instead of the higher dose recommended based on sliding scale.  - Strongly encourage continued endocrinology follow up with Dr. Janese Banks.   AKI on stage IIIb CKD: Likely due to diuresis in setting of diarrhea. Slightly improved since admission. UA with hyaline casts, otherwise bland. Of note, dipstick +heme with no RBCs, though CK wnl.  - Monitor serially, hope to be able to restart home medications soon.  HTN:  - Add norvasc and hydralazine to improve control while we hold enalapril and torsemide. Continue labetalol.   HLD:  - Continue rosuvastatin  Swelling: Most likely due to hypoalbuminemia-related third-spacing. I cannot find an echo report on Care Everywhere, though presumptive etiology is nephrotic syndrome.  - Optimize nutrition status  Diarrhea: Benign exam, struggling with this and constipation back and forth.  - Will give low dose loperamide to limit GI losses with hypokalemia.   Symptomatic anemia of chronic disease/CKD: Iron stores checked recently appear replete.  - Improved after 1u PRBCs. No active bleeding.  - Defer ESA to nephrology, follow up with [facility] recommended.   Proliferative diabetic retinopathy:  - Planning vitrectomy 11/15.  DVT prophylaxis: SCDs in the event that  there is bleeding with anemia Code Status: Full Family  Communication: None at bedside Disposition Plan:  Status is: Observation  The patient will require care spanning > 2 midnights and should be moved to inpatient because: Continues to be severely hypokalemic with ongoing GI losses and demonstrating cardiac irritability.  Consultants:  None  Procedures:  None  Antimicrobials: None   Subjective: Having upwards of 6 loose stools per day, if she tries to limit them, can go down to 2-3. Not liquid. Feels very cold despite room being warm. Feels diffusely weak as well, slightly improved from admission. No other complaints at this time.   Objective: Vitals:   04/11/21 0903 04/11/21 0914 04/11/21 1032 04/11/21 1109  BP: (!) 178/129 (!) 177/119 (!) 178/123 (!) 180/123  Pulse: 100 100 (!) 101 99  Resp: 12 18  11   Temp: 97.8 F (36.6 C)   98 F (36.7 C)  TempSrc: Oral   Oral  SpO2:  100% 100% 100%  Weight:      Height:        Intake/Output Summary (Last 24 hours) at 04/11/2021 1345 Last data filed at 04/11/2021 0845 Gross per 24 hour  Intake 2135.26 ml  Output 982 ml  Net 1153.26 ml   Filed Weights   04/10/21 2052 04/11/21 0225  Weight: 54.4 kg 53.7 kg    Gen: 30 y.o. female in no distress Pulm: Non-labored breathing room air. Clear to auscultation bilaterally.  CV: Regular rate and rhythm. No murmur, rub, or gallop. No JVD, 2+ diffuse pitting edema. GI: Abdomen soft, non-tender, non-distended, with normoactive bowel sounds. No organomegaly or masses felt. Ext: Warm, no deformities Skin: No rashes, lesions or ulcers on visualized skin. Neuro: Alert and oriented. Very diminished visual acuity with no CN deficits. No focal neurological deficits. Psych: Judgement and insight appear normal. Mood & affect appropriate.   Data Reviewed: I have personally reviewed following labs and imaging studies  CBC: Recent Labs  Lab 04/10/21 2017 04/11/21 0626  WBC 4.4 11.3*  NEUTROABS 2.2  --   HGB 7.4* 9.1*  HCT 21.0* 29.7*  MCV  85.0 89.2  PLT 298 937*   Basic Metabolic Panel: Recent Labs  Lab 04/10/21 2017 04/11/21 0626  NA 134* 135  K <2.0* 2.6*  CL 100 104  CO2 26 25  GLUCOSE 258* 329*  BUN 20 18  CREATININE 2.89* 2.80*  CALCIUM 7.2* 6.9*  MG  --  1.9   GFR: Estimated Creatinine Clearance: 24.5 mL/min (A) (by C-G formula based on SCr of 2.8 mg/dL (H)). Liver Function Tests: Recent Labs  Lab 04/10/21 2017  AST 17  ALT 10  ALKPHOS 75  BILITOT 0.4  PROT 4.8*  ALBUMIN 1.5*   Recent Labs  Lab 04/10/21 2017  LIPASE 24   No results for input(s): AMMONIA in the last 168 hours. Coagulation Profile: No results for input(s): INR, PROTIME in the last 168 hours. Cardiac Enzymes: Recent Labs  Lab 04/11/21 0626  CKTOTAL 166   BNP (last 3 results) No results for input(s): PROBNP in the last 8760 hours. HbA1C: No results for input(s): HGBA1C in the last 72 hours. CBG: Recent Labs  Lab 04/11/21 0219 04/11/21 0819 04/11/21 1148  GLUCAP 284* 310* 329*   Lipid Profile: No results for input(s): CHOL, HDL, LDLCALC, TRIG, CHOLHDL, LDLDIRECT in the last 72 hours. Thyroid Function Tests: No results for input(s): TSH, T4TOTAL, FREET4, T3FREE, THYROIDAB in the last 72 hours. Anemia Panel: No results for input(s): VITAMINB12, FOLATE, FERRITIN,  TIBC, IRON, RETICCTPCT in the last 72 hours. Urine analysis:    Component Value Date/Time   COLORURINE STRAW (A) 04/11/2021 0224   APPEARANCEUR CLEAR 04/11/2021 0224   LABSPEC 1.008 04/11/2021 0224   PHURINE 6.0 04/11/2021 0224   GLUCOSEU 150 (A) 04/11/2021 0224   HGBUR LARGE (A) 04/11/2021 0224   BILIRUBINUR NEGATIVE 04/11/2021 0224   KETONESUR NEGATIVE 04/11/2021 0224   PROTEINUR 100 (A) 04/11/2021 0224   UROBILINOGEN 0.2 11/06/2015 1011   NITRITE NEGATIVE 04/11/2021 0224   LEUKOCYTESUR NEGATIVE 04/11/2021 0224   Recent Results (from the past 240 hour(s))  Resp Panel by RT-PCR (Flu A&B, Covid) Nasopharyngeal Swab     Status: None   Collection  Time: 04/10/21 11:27 PM   Specimen: Nasopharyngeal Swab; Nasopharyngeal(NP) swabs in vial transport medium  Result Value Ref Range Status   SARS Coronavirus 2 by RT PCR NEGATIVE NEGATIVE Final    Comment: (NOTE) SARS-CoV-2 target nucleic acids are NOT DETECTED.  The SARS-CoV-2 RNA is generally detectable in upper respiratory specimens during the acute phase of infection. The lowest concentration of SARS-CoV-2 viral copies this assay can detect is 138 copies/mL. A negative result does not preclude SARS-Cov-2 infection and should not be used as the sole basis for treatment or other patient management decisions. A negative result may occur with  improper specimen collection/handling, submission of specimen other than nasopharyngeal swab, presence of viral mutation(s) within the areas targeted by this assay, and inadequate number of viral copies(<138 copies/mL). A negative result must be combined with clinical observations, patient history, and epidemiological information. The expected result is Negative.  Fact Sheet for Patients:  EntrepreneurPulse.com.au  Fact Sheet for Healthcare Providers:  IncredibleEmployment.be  This test is no t yet approved or cleared by the [place] FDA and  has been authorized for detection and/or diagnosis of SARS-CoV-2 by FDA under an Emergency Use Authorization (EUA). This EUA will remain  in effect (meaning this test can be used) for the duration of the COVID-19 declaration under Section 564(b)(1) of the Act, 21 U.S.C.section 360bbb-3(b)(1), unless the authorization is terminated  or revoked sooner.       Influenza A by PCR NEGATIVE NEGATIVE Final   Influenza B by PCR NEGATIVE NEGATIVE Final    Comment: (NOTE) The Xpert Xpress SARS-CoV-2/FLU/RSV plus assay is intended as an aid in the diagnosis of influenza from Nasopharyngeal swab specimens and should not be used as a sole basis for treatment. Nasal washings  and aspirates are unacceptable for Xpert Xpress SARS-CoV-2/FLU/RSV testing.  Fact Sheet for Patients: EntrepreneurPulse.com.au  Fact Sheet for Healthcare Providers: IncredibleEmployment.be  This test is not yet approved or cleared by the [place] FDA and has been authorized for detection and/or diagnosis of SARS-CoV-2 by FDA under an Emergency Use Authorization (EUA). This EUA will remain in effect (meaning this test can be used) for the duration of the COVID-19 declaration under Section 564(b)(1) of the Act, 21 U.S.C. section 360bbb-3(b)(1), unless the authorization is terminated or revoked.  Performed at [facility], [place] [address], [place], [place] [address]       Radiology Studies: No results found.  Scheduled Meds:  sodium chloride   Intravenous Once   amLODipine  10 mg Oral Daily   hydrALAZINE  25 mg Oral Q8H   insulin aspart  0-5 Units Subcutaneous QHS   insulin aspart  0-9 Units Subcutaneous TID WC   insulin glargine-yfgn  5 Units Subcutaneous Daily   labetalol  100 mg Oral BID  loperamide  2 mg Oral BID   pantoprazole  40 mg Oral Daily   potassium chloride  40 mEq Oral Q4H   rosuvastatin  20 mg Oral QHS   sodium chloride flush  3 mL Intravenous Q12H   Continuous Infusions:   LOS: 0 days   Time spent: 35 minutes.  Patrecia Pour, MD [facility] [email or web address] 04/11/2021, 1:45 PM

## 2021-04-11 NOTE — Progress Notes (Addendum)
Pt refused infusion of normal saline + 20 K .

## 2021-04-11 NOTE — H&P (Signed)
History and Physical    [name] HYQ:[id number] DOB: Jun 05, 1990 DOA: 04/10/2021  PCP: [facility] [place]   Patient coming from: Home   Chief Complaint: low potassium and hemoglobin   HPI: Ann Robertson is a pleasant 30 y.o. female with medical history significant for type 1 diabetes mellitus, chronic kidney disease stage IIIb, anemia, hypertension, and chronic pain, now presenting to the emergency department for evaluation of low hemoglobin and low potassium.  Patient reports that she had preoperative lab work performed today and was sent to the emergency department for evaluation and treatment of hypokalemia and anemia.  She denies any melena or hematochezia and reports that she does not have menstrual periods.  She reports that she has been taking her medications except for labetalol which she missed recently.  She does not take potassium supplements at home.  She denies any nausea or vomiting but has had some loose stools. Swelling is improved on diuretics.   ED Course: Upon arrival to the ED, patient is found to be afebrile, saturating mid to upper 90s on room air, and with blood pressure as high as 200/120.  EKG features sinus tachycardia with rate 100 and QTc 541 ms.  Chemistry panel concerning for potassium <2, albumin 1.5, and creatinine 2.89.  CBC features a hemoglobin of 7.4.  Patient was treated with 60 mg IV Lasix, IV labetalol, IV potassium, IV magnesium, 1 unit of RBC, and a liter of saline in the ED.  Review of Systems:  All other systems reviewed and apart from HPI, are negative.  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   Neuropathy    Preeclampsia    STD (sexually transmitted disease)     Past Surgical History:  Procedure Laterality Date   CESAREAN SECTION     CESAREAN SECTION N/A 07/05/2013   Procedure: CESAREAN SECTION;  Surgeon: Emily Filbert, MD;  Location: [facility];  Service: Obstetrics;   Laterality: N/A;   CESAREAN SECTION N/A 07/25/2014   Procedure: CESAREAN SECTION;  Surgeon: Donnamae Jude, MD;  Location: [facility];  Service: Obstetrics;  Laterality: N/A;   CESAREAN SECTION N/A 11/21/2015   Procedure: CESAREAN SECTION;  Surgeon: Donnamae Jude, MD;  Location: [facility];  Service: Obstetrics;  Laterality: N/A;   WISDOM TOOTH EXTRACTION      Social History:   reports that she has been smoking cigarettes. She has a 2.25 pack-year smoking history. She has never used smokeless tobacco. She reports current alcohol use. She reports that she does not use drugs.  Allergies  Allergen Reactions   Hydromorphone Hcl Itching and Rash    Family History  Problem Relation Age of Onset   Stroke Mother    Hypertension Mother    Heart disease Mother    Kidney disease Mother    Hyperlipidemia Father    Diabetes Father      Prior to Admission medications   Medication Sig Start Date End Date Taking? Authorizing Provider  bisacodyl (DULCOLAX) 10 MG suppository Place 1 suppository (10 mg total) rectally daily as needed for moderate constipation. 02/21/21  Yes Dahal, Marlowe Aschoff, MD  Continuous Blood Gluc Sensor (DEXCOM G6 SENSOR) MISC Apply topically. 03/12/21  Yes [provider]  Continuous Blood Gluc Sensor (DEXCOM G6 SENSOR) MISC 1 (ONE) EACH AS DIRECTED 03/12/21  Yes [provider]  Continuous Blood Gluc Transmit (DEXCOM G6 TRANSMITTER) MISC USE AS DIRECTED EVERY 3 MONTHS  02/22/21  Yes [provider]  enalapril (VASOTEC) 5 MG tablet Take 5 mg by mouth 2 (two) times daily. 03/18/21  Yes [provider]  gabapentin (NEURONTIN) 600 MG tablet Take 600 mg by mouth 3 (three) times daily as needed (pain). 10/28/20  Yes [provider]  insulin glargine (LANTUS SOLOSTAR) 100 UNIT/ML Solostar Pen Inject 5 Units into the skin daily. And pen needles 1/day 02/02/21  Yes Dwyane Dee, MD  insulin lispro (HUMALOG) 100 UNIT/ML KwikPen Inject 2-12 Units  into the skin in the morning, at noon, and at bedtime. Per sliding scale   Yes [provider]  Insulin Pen Needle (PEN NEEDLES) 30G X 8 MM MISC 1 each by Does not apply route daily. E11.9 05/14/19  Yes Renato Shin, MD  labetalol (NORMODYNE) 100 MG tablet Take 100 mg by mouth 2 (two) times daily. 03/26/21  Yes [provider]  methocarbamol (ROBAXIN) 750 MG tablet Take 750 mg by mouth every 8 (eight) hours as needed for muscle spasms. 03/16/21  Yes [provider]  omeprazole (PRILOSEC) 20 MG capsule Take 20 mg by mouth daily.   Yes [provider]  ondansetron (ZOFRAN) 4 MG tablet Take 1 tablet (4 mg total) by mouth every 8 (eight) hours as needed for nausea or vomiting. 06/10/20  Yes Renato Shin, MD  oxyCODONE-acetaminophen (PERCOCET) 10-325 MG tablet Take 1 tablet by mouth every 4 (four) hours as needed for pain. 02/13/21  Yes [provider]  rosuvastatin (CRESTOR) 20 MG tablet Take 20 mg by mouth at bedtime. 03/16/21  Yes [provider]  torsemide (DEMADEX) 20 MG tablet Take 1 tablet (20 mg total) by mouth 2 (two) times daily. 02/21/21 05/22/21 Yes Dahal, Marlowe Aschoff, MD  Vitamin D, Ergocalciferol, (DRISDOL) 1.25 MG (50000 UNIT) CAPS capsule Take 50,000 Units by mouth every Tuesday.   Yes [provider]  carvedilol (COREG) 25 MG tablet Take 1 tablet (25 mg total) by mouth 2 (two) times daily with a meal. Patient not taking: Reported on 04/10/2021 02/21/21 03/23/21  Terrilee Croak, MD  Continuous Blood Gluc Receiver (FREESTYLE LIBRE 2 READER) DEVI 1 each by Does not apply route in the morning, at noon, in the evening, and at bedtime. Patient not taking: Reported on 04/10/2021 10/31/20   Lyndee Hensen, DO  pantoprazole (PROTONIX) 40 MG tablet Take 1 tablet (40 mg total) by mouth daily. Patient not taking: Reported on 04/10/2021 02/03/21   Dwyane Dee, MD  senna-docusate (SENOKOT-S) 8.6-50 MG tablet Take 1 tablet by mouth 2 (two) times  daily. Patient not taking: No sig reported 02/21/21 05/22/21  Terrilee Croak, MD    Physical Exam: Vitals:   04/11/21 0000 04/11/21 0030 04/11/21 0100 04/11/21 0116  BP: (!) 204/118 (!) 208/116 (!) 178/110   Pulse: 97 92 91   Resp: 15 (!) 9 10   Temp:    98.1 F (36.7 C)  TempSrc:    Oral  SpO2: 100% 100% 100%   Weight:      Height:        Constitutional: NAD, calm  Eyes: PERTLA, lids and conjunctivae normal ENMT: Mucous membranes are moist. Posterior pharynx clear of any exudate or lesions.   Neck: supple, no masses  Respiratory:  no wheezing, no crackles. No accessory muscle use.  Cardiovascular: S1 & S2 heard, regular rate and rhythm. B/l pedal edema. Abdomen: No distension, no tenderness, soft. Bowel sounds active.  Musculoskeletal: no clubbing / cyanosis. No joint deformity upper and lower extremities.  Skin: no significant rashes, lesions, ulcers. Warm, dry, well-perfused. Neurologic: No aphasia, no facial asymmetry. Moving all extremities. Alert and oriented.  Psychiatric: Calm. Cooperative.    Labs and Imaging on Admission: I have personally reviewed following labs and imaging studies  CBC: Recent Labs  Lab 04/10/21 2017  WBC 4.4  NEUTROABS 2.2  HGB 7.4*  HCT 21.0*  MCV 85.0  PLT 644   Basic Metabolic Panel: Recent Labs  Lab 04/10/21 2017  NA 134*  K <2.0*  CL 100  CO2 26  GLUCOSE 258*  BUN 20  CREATININE 2.89*  CALCIUM 7.2*   GFR: Estimated Creatinine Clearance: 24 mL/min (A) (by C-G formula based on SCr of 2.89 mg/dL (H)). Liver Function Tests: Recent Labs  Lab 04/10/21 2017  AST 17  ALT 10  ALKPHOS 75  BILITOT 0.4  PROT 4.8*  ALBUMIN 1.5*   Recent Labs  Lab 04/10/21 2017  LIPASE 24   No results for input(s): AMMONIA in the last 168 hours. Coagulation Profile: No results for input(s): INR, PROTIME in the last 168 hours. Cardiac Enzymes: No results for input(s): CKTOTAL, CKMB, CKMBINDEX, TROPONINI in the last 168 hours. BNP (last  3 results) No results for input(s): PROBNP in the last 8760 hours. HbA1C: No results for input(s): HGBA1C in the last 72 hours. CBG: No results for input(s): GLUCAP in the last 168 hours. Lipid Profile: No results for input(s): CHOL, HDL, LDLCALC, TRIG, CHOLHDL, LDLDIRECT in the last 72 hours. Thyroid Function Tests: No results for input(s): TSH, T4TOTAL, FREET4, T3FREE, THYROIDAB in the last 72 hours. Anemia Panel: No results for input(s): VITAMINB12, FOLATE, FERRITIN, TIBC, IRON, RETICCTPCT in the last 72 hours. Urine analysis:    Component Value Date/Time   COLORURINE YELLOW 02/15/2021 0634   APPEARANCEUR CLOUDY (A) 02/15/2021 0634   LABSPEC 1.020 02/15/2021 0634   PHURINE 6.0 02/15/2021 0634   GLUCOSEU >=500 (A) 02/15/2021 0634   HGBUR MODERATE (A) 02/15/2021 0634   BILIRUBINUR NEGATIVE 02/15/2021 0634   KETONESUR 20 (A) 02/15/2021 0634   PROTEINUR >=300 (A) 02/15/2021 0634   UROBILINOGEN 0.2 11/06/2015 1011   NITRITE NEGATIVE 02/15/2021 0634   LEUKOCYTESUR NEGATIVE 02/15/2021 0634   Sepsis Labs: @LABRCNTIP (procalcitonin:4,lacticidven:4) ) Recent Results (from the past 240 hour(s))  Resp Panel by RT-PCR (Flu A&B, Covid) Nasopharyngeal Swab     Status: None   Collection Time: 04/10/21 11:27 PM   Specimen: Nasopharyngeal Swab; Nasopharyngeal(NP) swabs in vial transport medium  Result Value Ref Range Status   SARS Coronavirus 2 by RT PCR NEGATIVE NEGATIVE Final    Comment: (NOTE) SARS-CoV-2 target nucleic acids are NOT DETECTED.  The SARS-CoV-2 RNA is generally detectable in upper respiratory specimens during the acute phase of infection. The lowest concentration of SARS-CoV-2 viral copies this assay can detect is 138 copies/mL. A negative result does not preclude SARS-Cov-2 infection and should not be used as the sole basis for treatment or other patient management decisions. A negative result may occur with  improper specimen collection/handling, submission of  specimen other than nasopharyngeal swab, presence of viral mutation(s) within the areas targeted by this assay, and inadequate number of viral copies(<138 copies/mL). A negative result must be combined with clinical observations, patient history, and epidemiological information. The expected result is Negative.  Fact Sheet for Patients:  EntrepreneurPulse.com.au  Fact Sheet for Healthcare Providers:  IncredibleEmployment.be  This test is no t yet approved or cleared by the [place] FDA and  has been authorized for detection and/or diagnosis of SARS-CoV-2  by FDA under an Emergency Use Authorization (EUA). This EUA will remain  in effect (meaning this test can be used) for the duration of the COVID-19 declaration under Section 564(b)(1) of the Act, 21 U.S.C.section 360bbb-3(b)(1), unless the authorization is terminated  or revoked sooner.       Influenza A by PCR NEGATIVE NEGATIVE Final   Influenza B by PCR NEGATIVE NEGATIVE Final    Comment: (NOTE) The Xpert Xpress SARS-CoV-2/FLU/RSV plus assay is intended as an aid in the diagnosis of influenza from Nasopharyngeal swab specimens and should not be used as a sole basis for treatment. Nasal washings and aspirates are unacceptable for Xpert Xpress SARS-CoV-2/FLU/RSV testing.  Fact Sheet for Patients: EntrepreneurPulse.com.au  Fact Sheet for Healthcare Providers: IncredibleEmployment.be  This test is not yet approved or cleared by the [place] FDA and has been authorized for detection and/or diagnosis of SARS-CoV-2 by FDA under an Emergency Use Authorization (EUA). This EUA will remain in effect (meaning this test can be used) for the duration of the COVID-19 declaration under Section 564(b)(1) of the Act, 21 U.S.C. section 360bbb-3(b)(1), unless the authorization is terminated or revoked.  Performed at [facility], [place] [address],  [place], [place] [address]      Radiological Exams on Admission: No results found.  EKG: Independently reviewed. Sinus tachycardia, rate 100, QTc 541 ms.   Assessment/Plan   1. Hypokalemia  - Sent for hypokalemia on preop labs and found to have serum potassium <2.0 in ED  - She reports some loose stools and started torsemide in September 2022  - She was given 20 mEq IV potassium and 2 g IV mag in ED  - Hold diuretics for now, continue potassium replacement, repeat chem panel    2. Anemia  - Hgb 7.4 on admission, down from 10.5 in September without overt bleeding  - She is being transfused 1 unit RBC in ED  - Check FOBT, post-transfusion H&H    3. AKI superimposed on CKD IIIb - SCr is 2.89 on admission, up from 2.1 in September 2022  - Check UA, check CK, renally-dose medications, hold ACE and diuretic for now, hydrate with IVF, repeat chem panel    4. Type I DM  - A1c was 12.0% in August 2022  - Continue CBG checks and insulin    5. Hypertensive urgency  - BP as high as 200/120 in ED in setting of medication non-compliance  - She was given IV Lasix and IV labetalol in ED with improvement - Continue labetalol, check plasma renin and aldosterone in light of hypokalemia    6. Chronic pain  - Database reviewed   - Continue home medications but renally-dose gabapentin    DVT prophylaxis: SCDs  Code Status: Full  Level of Care: Level of care: Telemetry Medical Family Communication: none present  Disposition Plan:  Patient is from: home  Anticipated d/c is to: Home  Anticipated d/c date is: 11/12 or 04/12/21 Patient currently: pending potassium repletion, post-transfusion H&H  Consults called: none  Admission status: Observation     Vianne Bulls, MD [facility]  04/11/2021, 1:31 AM

## 2021-04-12 DIAGNOSIS — E876 Hypokalemia: Secondary | ICD-10-CM | POA: Diagnosis not present

## 2021-04-12 DIAGNOSIS — N1832 Chronic kidney disease, stage 3b: Secondary | ICD-10-CM | POA: Diagnosis not present

## 2021-04-12 DIAGNOSIS — D631 Anemia in chronic kidney disease: Secondary | ICD-10-CM

## 2021-04-12 DIAGNOSIS — N179 Acute kidney failure, unspecified: Secondary | ICD-10-CM | POA: Diagnosis not present

## 2021-04-12 DIAGNOSIS — G8929 Other chronic pain: Secondary | ICD-10-CM | POA: Diagnosis not present

## 2021-04-12 LAB — BASIC METABOLIC PANEL
Anion gap: 6 (ref 5–15)
BUN: 19 mg/dL (ref 6–20)
CO2: 22 mmol/L (ref 22–32)
Calcium: 6.7 mg/dL — ABNORMAL LOW (ref 8.9–10.3)
Chloride: 103 mmol/L (ref 98–111)
Creatinine, Ser: 3.13 mg/dL — ABNORMAL HIGH (ref 0.44–1.00)
GFR, Estimated: 20 mL/min — ABNORMAL LOW (ref >60)
Glucose, Bld: 190 mg/dL — ABNORMAL HIGH (ref 70–99)
Potassium: 3.6 mmol/L (ref 3.5–5.1)
Sodium: 131 mmol/L — ABNORMAL LOW (ref 135–145)

## 2021-04-12 LAB — CBC
HCT: 23.4 % — ABNORMAL LOW (ref 36.0–46.0)
Hemoglobin: 8.2 g/dL — ABNORMAL LOW (ref 12.0–15.0)
MCH: 30.3 pg (ref 26.0–34.0)
MCHC: 35 g/dL (ref 30.0–36.0)
MCV: 86.3 fL (ref 80.0–100.0)
Platelets: 232 10*3/uL (ref 150–400)
RBC: 2.71 MIL/uL — ABNORMAL LOW (ref 3.87–5.11)
RDW: 14.6 % (ref 11.5–15.5)
WBC: 5.2 10*3/uL (ref 4.0–10.5)
nRBC: 0 % (ref 0.0–0.2)

## 2021-04-12 LAB — GLUCOSE, CAPILLARY: Glucose-Capillary: 223 mg/dL — ABNORMAL HIGH (ref 70–99)

## 2021-04-12 LAB — UREA NITROGEN, URINE: Urea Nitrogen, Ur: 81 mg/dL

## 2021-04-12 LAB — MAGNESIUM: Magnesium: 2.3 mg/dL (ref 1.7–2.4)

## 2021-04-12 MED ORDER — POTASSIUM CHLORIDE CRYS ER 20 MEQ PO TBCR: 20.0000 meq | EXTENDED_RELEASE_TABLET | Freq: Two times a day (BID) | ORAL | 0 refills | Status: DC

## 2021-04-12 MED ORDER — AMLODIPINE BESYLATE 10 MG PO TABS: 10.0000 mg | ORAL_TABLET | Freq: Every day | ORAL | 0 refills | Status: DC

## 2021-04-12 MED ORDER — INSULIN ASPART 100 UNIT/ML IJ SOLN
2.0000 [IU] | Freq: Once | INTRAMUSCULAR | Status: AC
  Administered 2021-04-12: 2 [IU] via SUBCUTANEOUS

## 2021-04-12 MED ORDER — GABAPENTIN 600 MG PO TABS: 300.0000 mg | ORAL_TABLET | Freq: Three times a day (TID) | ORAL | Status: DC | PRN

## 2021-04-12 NOTE — Discharge Summary (Signed)
Physician Discharge Summary  Leanor Voris GEZ:[id number] DOB: [birth year] DOA: 04/10/2021  PCP: [facility]  Admit date: 04/10/2021 Discharge date: 04/12/2021  Admitted From: Home Disposition: Home   Recommendations for Outpatient Follow-up:  Follow up with PCP in 1-2 weeks with recheck renal function panel, blood counts, and blood pressure check.  Follow up with nephrology in the next 1 month.  Home Health: None Equipment/Devices: None Discharge Condition: Stable CODE STATUS: Full Diet recommendation: Renal, carb-modified  Brief/Interim Summary: Ann Robertson is a 30 y.o. female with a history of uncontrolled T1DM, stage IIIb CKD, anemia of CKD, HTN, leg swelling on torsemide, and chronic pain who presented to the ED due to lab abnormalities. She has proliferative diabetic retinopathy with vitreous hemorrhage for which pars plana vitrectomy is scheduled 10/15. During a preoperative appointment, labs were drawn and later resulted showing hypokalemia (K 2.6) and anemia (hgb 7.1g/dl) for which she was referred to the ED. She'd also had diarrhea and felt weak for several days, though was taking medications including torsemide. She was afebrile, hypertensive (200/120) with hypokalemia confirmed (undetectable) and anemia stable at 7.4g/dl. Creatinine above baseline at 2.89, QTc 546msec. Normal saline bolus, potassium and magnesium supplementation were given, 1u PRBCs ordered for symptomatic anemia, and lasix 40mg  IV administered prior to admission. Continued electrolyte supplementation was continued with eventual normalization. Initial prolonged QT interval and frequent PVCs have resolved. Hemoglobin has increased as expected and there has been no bleeding. Antihypertensive regimen was augmented and well tolerated.   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 This has resolved.  - Continue KDUR 73mEq daily for the next 2 weeks during which time she should have this rechecked.    T1DM: Followed by endocrinology in [facility]. Poorly controlled (recent HbA1c 12%, was >15% April 2022, and most values >10% dating back to 2014). Hyperglycemic without acidosis or anion gap. - Strongly encourage continued endocrinology follow up with Dr. Janese Banks.    AKI on stage IIIb CKD: Likely due to diuresis in setting of diarrhea. Roughly stable since admission. UA with hyaline casts, otherwise bland. Of note, dipstick +heme with no RBCs, though CK wnl.  - Discussed with nephrology, Dr. Jonnie Finner, who recommends restarting torsemide and ACEi for nephrotic syndrome and follow up at [facility].    HTN:  - Add norvasc to improve overall control (seems to have been elevated both here and as outpatient). Continue enalapril and torsemide. Continue labetalol.    HLD:  - Continue rosuvastatin   Swelling, nephrotic syndrome: Most likely due to hypoalbuminemia-related third-spacing. I cannot find an echo report on [facility]where, though presumptive etiology is nephrotic syndrome.  - Optimize nutrition status   Diarrhea: Benign exam, resolved at discharge - Anticipate improved metabolic profile with no further GI losses    Symptomatic anemia of chronic disease/CKD: Iron stores checked recently appear replete.  - Improved after 1u PRBCs. No active bleeding.  - Defer ESA to nephrology, follow up with [facility] recommended.    Proliferative diabetic retinopathy:  - Planning vitrectomy 11/15.  Discharge Instructions Discharge Instructions     Diet Carb  Modified   Complete  by: As directed    Discharge instructions   Complete by: As directed    You were admitted for low potassium and anemia. These have improved, and you are stable for discharge with the following recommendations:  - You will need to restart your home medications including torsemide twice daily, labetalol, enalapril. START taking norvasc 10mg  once daily to assist with high blood pressure. If you feel light headed or your blood pressure goes below 120/80, do not take this medication.  - To help keep potassium levels up, take potassium 55mEq once daily for the next 2 weeks. It is very important that you follow up with your doctor (either PCP or nephrology) within that time frame to repeat your labs. - You were given a unit of red blood cells with appropriate improvement in your anemia. You have no evidence of bleeding, but do have anemia of chronic kidney disease. This essentially is due to your kidneys not producing the appropriate stimulus for your bone marrow to make more red blood cells. There are treatments available, though these will need to be discussed with your kidney doctor. If you DO notice bleeding, seek medical attention right away.  - Due to your current kidney impairment, you should only take a maximum of 300mg  of gabapentin up to a maximum of 3 times per day as needed for pain.  - If your symptoms return, seek medical attention right away.   Increase activity slowly   Complete by: As directed       Allergies as of 04/12/2021       Reactions   Hydromorphone Hcl Itching, Rash        Medication List     STOP taking these medications    carvedilol 25 MG tablet Commonly known as: COREG   FreeStyle Libre 2 Reader Devi   pantoprazole 40 MG tablet Commonly known as: PROTONIX   senna-docusate 8.6-50 MG tablet Commonly known as: Senokot-S       TAKE these medications    amLODipine 10 MG tablet Commonly known as: NORVASC Take 1 tablet (10 mg total) by mouth daily. Start  taking on: April 13, 2021   bisacodyl 10 MG suppository Commonly known as: DULCOLAX Place 1 suppository (10 mg total) rectally daily as needed for moderate constipation.   Dexcom G6 Sensor Misc Apply topically.   Dexcom G6 Sensor Misc 1 (ONE) EACH AS DIRECTED   Dexcom G6 Transmitter Misc USE AS DIRECTED EVERY 3 MONTHS   enalapril 5 MG tablet Commonly known as: VASOTEC Take 5 mg by mouth 2 (two) times daily.   gabapentin 600 MG tablet Commonly known as: NEURONTIN Take 0.5 tablets (300 mg total) by mouth 3 (three) times daily as needed (pain). What changed: how much to take   insulin lispro 100 UNIT/ML KwikPen Commonly known as: HUMALOG Inject 2-12 Units into the skin in the morning, at noon, and at bedtime. Per sliding scale   labetalol 100 MG tablet Commonly known as: NORMODYNE Take 100 mg by mouth 2 (two) times daily.   Lantus SoloStar 100 UNIT/ML Solostar Pen Generic drug: insulin glargine Inject 5 Units into the skin daily. And pen needles 1/day   methocarbamol 750 MG tablet Commonly known as: ROBAXIN Take 750 mg by mouth every 8 (eight) hours as needed for muscle spasms.   omeprazole 20 MG capsule Commonly known as: PRILOSEC Take 20 mg by mouth daily.   ondansetron 4 MG tablet Commonly known as: ZOFRAN Take 1 tablet (  4 mg total) by mouth every 8 (eight) hours as needed for nausea or vomiting.   oxyCODONE-acetaminophen 10-325 MG tablet Commonly known as: PERCOCET Take 1 tablet by mouth every 4 (four) hours as needed for pain.   Pen Needles 30G X 8 MM Misc 1 each by Does not apply route daily. E11.9   potassium chloride SA 20 MEQ tablet Commonly known as: KLOR-CON Take 1 tablet (20 mEq total) by mouth 2 (two) times daily.   rosuvastatin 20 MG tablet Commonly known as: CRESTOR Take 20 mg by mouth at bedtime.   torsemide 20 MG tablet Commonly known as: Demadex Take 1 tablet (20 mg total) by mouth 2 (two) times daily.   Vitamin D (Ergocalciferol)  1.25 MG (50000 UNIT) Caps capsule Commonly known as: DRISDOL Take 50,000 Units by mouth every Tuesday.        Follow-up [facility]. Schedule an appointment as soon as possible for a visit.   Contact information: [place] [address] [phone]         Madelon Lips, MD. Schedule an appointment as soon as possible for a visit in 1 week(s).   Specialty: Nephrology Contact information: [place] [address] [phone]                Allergies  Allergen Reactions   Hydromorphone Hcl Itching and Rash    Consultations: Nephrology, Dr. Jonnie Finner by phone  Procedures/Studies: No results found.  Subjective: Feels better, swelling stable. States she has weak urinary stream without any other urinary symptoms, particularly no dysuria. This has been going on for some time.   Discharge Exam: Vitals:   04/12/21 0614 04/12/21 0904  BP: 118/89 (!) 139/91  Pulse: 99 (!) 101  Resp:  14  Temp: 98.1 F (36.7 C) 98.2 F (36.8 C)  SpO2: 100% 100%   General: Pt is alert, awake, not in acute distress HEENT: Decreased visual acuity Cardiovascular: Regular borderline tachycardia, S1/S2 +, no rubs, no gallops Respiratory: CTA bilaterally, no wheezing, no rhonchi Abdominal: Soft, NT, ND, bowel sounds + Extremities: 2+ stable diffuse, mostly dependent edema, no cyanosis  Labs: BNP (last 3 results) Recent Labs    02/14/21 2130  BNP 834.1*   Basic Metabolic Panel: Recent Labs  Lab 04/10/21 2017 04/11/21 0626 04/11/21 1616 04/12/21 0222  NA 134* 135 136 131*  K <2.0* 2.6* 3.0* 3.6  CL 100 104 106 103  CO2 26 25 22 22   GLUCOSE 258* 329* 159* 190*  BUN 20 18 18 19   CREATININE 2.89* 2.80* 2.89* 3.13*  CALCIUM 7.2* 6.9* 7.1* 6.7*  MG  --  1.9  --  2.3   Liver Function Tests: Recent Labs  Lab 04/10/21 2017  AST 17  ALT 10  ALKPHOS 75  BILITOT 0.4  PROT 4.8*  ALBUMIN 1.5*   Recent Labs  Lab  04/10/21 2017  LIPASE 24   No results for input(s): AMMONIA in the last 168 hours. CBC: Recent Labs  Lab 04/10/21 2017 04/11/21 0626 04/12/21 0222  WBC 4.4 11.3* 5.2  NEUTROABS 2.2  --   --   HGB 7.4* 9.1* 8.2*  HCT 21.0* 29.7* 23.4*  MCV 85.0 89.2 86.3  PLT 298 480* 232   Cardiac Enzymes: Recent Labs  Lab 04/11/21 0626  CKTOTAL 166   BNP: Invalid input(s): POCBNP CBG: Recent Labs  Lab 04/11/21 0819 04/11/21 1148 04/11/21 1626 04/11/21 2118 04/12/21 0909  GLUCAP 310* 329* 159*  161* 223*   D-Dimer No results for input(s): DDIMER in the last 72 hours. Hgb A1c No results for input(s): HGBA1C in the last 72 hours. Lipid Profile No results for input(s): CHOL, HDL, LDLCALC, TRIG, CHOLHDL, LDLDIRECT in the last 72 hours. Thyroid function studies No results for input(s): TSH, T4TOTAL, T3FREE, THYROIDAB in the last 72 hours.  Invalid input(s): FREET3 Anemia work up No results for input(s): VITAMINB12, FOLATE, FERRITIN, TIBC, IRON, RETICCTPCT in the last 72 hours. Urinalysis    Component Value Date/Time   COLORURINE STRAW (A) 04/11/2021 0224   APPEARANCEUR CLEAR 04/11/2021 0224   LABSPEC 1.008 04/11/2021 0224   PHURINE 6.0 04/11/2021 0224   GLUCOSEU 150 (A) 04/11/2021 0224   HGBUR LARGE (A) 04/11/2021 0224   BILIRUBINUR NEGATIVE 04/11/2021 0224   KETONESUR NEGATIVE 04/11/2021 0224   PROTEINUR 100 (A) 04/11/2021 0224   UROBILINOGEN 0.2 11/06/2015 1011   NITRITE NEGATIVE 04/11/2021 0224   LEUKOCYTESUR NEGATIVE 04/11/2021 0224    Microbiology Recent Results (from the past 240 hour(s))  Resp Panel by RT-PCR (Flu A&B, Covid) Nasopharyngeal Swab     Status: None   Collection Time: 04/10/21 11:27 PM   Specimen: Nasopharyngeal Swab; Nasopharyngeal(NP) swabs in vial transport medium  Result Value Ref Range Status   SARS Coronavirus 2 by RT PCR NEGATIVE NEGATIVE Final    Comment: (NOTE) SARS-CoV-2 target nucleic acids are NOT DETECTED.  The SARS-CoV-2 RNA is  generally detectable in upper respiratory specimens during the acute phase of infection. The lowest concentration of SARS-CoV-2 viral copies this assay can detect is 138 copies/mL. A negative result does not preclude SARS-Cov-2 infection and should not be used as the sole basis for treatment or other patient management decisions. A negative result may occur with  improper specimen collection/handling, submission of specimen other than nasopharyngeal swab, presence of viral mutation(s) within the areas targeted by this assay, and inadequate number of viral copies(<138 copies/mL). A negative result must be combined with clinical observations, patient history, and epidemiological information. The expected result is Negative.  Fact Sheet for Patients:  EntrepreneurPulse.com.au  Fact Sheet for Healthcare Providers:  IncredibleEmployment.be  This test is no t yet approved or cleared by the [place] FDA and  has been authorized for detection and/or diagnosis of SARS-CoV-2 by FDA under an Emergency Use Authorization (EUA). This EUA will remain  in effect (meaning this test can be used) for the duration of the COVID-19 declaration under Section 564(b)(1) of the Act, 21 U.S.C.section 360bbb-3(b)(1), unless the authorization is terminated  or revoked sooner.       Influenza A by PCR NEGATIVE NEGATIVE Final   Influenza B by PCR NEGATIVE NEGATIVE Final    Comment: (NOTE) The Xpert Xpress SARS-CoV-2/FLU/RSV plus assay is intended as an aid in the diagnosis of influenza from Nasopharyngeal swab specimens and should not be used as a sole basis for treatment. Nasal washings and aspirates are unacceptable for Xpert Xpress SARS-CoV-2/FLU/RSV testing.  Fact Sheet for Patients: EntrepreneurPulse.com.au  Fact Sheet for Healthcare Providers: IncredibleEmployment.be  This test is not yet approved or cleared by the [place] FDA and has been authorized for detection and/or diagnosis of SARS-CoV-2 by FDA under an Emergency Use Authorization (EUA). This EUA will remain in effect (meaning this test can be used) for the duration of the COVID-19 declaration under Section 564(b)(1) of the Act, 21 U.S.C. section 360bbb-3(b)(1), unless the authorization is terminated or revoked.  Performed at [facility], [place] [address] [place], [place]  [address]     Time coordinating discharge: Approximately 40 minutes  Patrecia Pour, MD  [facility] 04/12/2021, 11:24 AM

## 2021-04-12 NOTE — Plan of Care (Signed)
  Problem: Education: Goal: Knowledge of General Education information will improve Description: Including pain rating scale, medication(s)/side effects and non-pharmacologic comfort measures 04/12/2021 0038 by Kaylyn Lim, RN Outcome: Progressing 04/12/2021 0038 by Kaylyn Lim, RN Outcome: Progressing   Problem: Clinical Measurements: Goal: Ability to maintain clinical measurements within normal limits will improve 04/12/2021 0038 by Kaylyn Lim, RN Outcome: Progressing 04/12/2021 0038 by Kaylyn Lim, RN Outcome: Progressing   Problem: Clinical Measurements: Goal: Diagnostic test results will improve 04/12/2021 0038 by Kaylyn Lim, RN Outcome: Progressing 04/12/2021 0038 by Kaylyn Lim, RN Outcome: Progressing   Problem: Clinical Measurements: Goal: Cardiovascular complication will be avoided 04/12/2021 0038 by Kaylyn Lim, RN Outcome: Progressing 04/12/2021 0038 by Kaylyn Lim, RN Outcome: Progressing   Problem: Activity: Goal: Risk for activity intolerance will decrease 04/12/2021 0038 by Kaylyn Lim, RN Outcome: Progressing 04/12/2021 0038 by Kaylyn Lim, RN Outcome: Progressing   Problem: Nutrition: Goal: Adequate nutrition will be maintained 04/12/2021 0038 by Kaylyn Lim, RN Outcome: Progressing 04/12/2021 0038 by Kaylyn Lim, RN Outcome: Progressing   Problem: Coping: Goal: Level of anxiety will decrease 04/12/2021 0038 by Kaylyn Lim, RN Outcome: Progressing 04/12/2021 0038 by Kaylyn Lim, RN Outcome: Progressing   Problem: Elimination: Goal: Will not experience complications related to bowel motility 04/12/2021 0038 by Kaylyn Lim, RN Outcome: Progressing 04/12/2021 0038 by Kaylyn Lim, RN Outcome: Progressing   Problem: Pain Managment: Goal: General experience of comfort will improve 04/12/2021 0038 by Kaylyn Lim, RN Outcome: Progressing 04/12/2021 0038 by Kaylyn Lim, RN Outcome: Progressing   Problem: Safety: Goal:  Ability to remain free from injury will improve 04/12/2021 0038 by Kaylyn Lim, RN Outcome: Progressing 04/12/2021 0038 by Kaylyn Lim, RN Outcome: Progressing

## 2021-04-13 LAB — HEMOGLOBIN A1C
Hgb A1c MFr Bld: 6.9 % — ABNORMAL HIGH (ref 4.8–5.6)
Mean Plasma Glucose: 151 mg/dL

## 2021-04-17 LAB — ALDOSTERONE + RENIN ACTIVITY W/ RATIO
ALDO / PRA Ratio: UNDETERMINED
Aldosterone: <1 ng/dL (ref 0.0–30.0)
PRA LC/MS/MS: <0.167 ng/mL/hr — ABNORMAL LOW (ref 0.167–5.380)

## 2021-04-18 ENCOUNTER — Encounter (HOSPITAL_COMMUNITY): Payer: Self-pay

## 2021-04-18 ENCOUNTER — Inpatient Hospital Stay (HOSPITAL_COMMUNITY): Payer: Medicaid Other

## 2021-04-18 ENCOUNTER — Other Ambulatory Visit: Payer: Self-pay

## 2021-04-18 ENCOUNTER — Inpatient Hospital Stay (HOSPITAL_COMMUNITY)
Admission: EM | Admit: 2021-04-18 | Discharge: 2021-04-23 | DRG: 871 | Disposition: A | Discharge status: Other Acute Inpatient Hospital | Payer: Medicaid Other | Attending: Internal Medicine | Admitting: Internal Medicine

## 2021-04-18 DIAGNOSIS — Z841 Family history of disorders of kidney and ureter: Secondary | ICD-10-CM

## 2021-04-18 DIAGNOSIS — G9341 Metabolic encephalopathy: Secondary | ICD-10-CM

## 2021-04-18 DIAGNOSIS — E8809 Other disorders of plasma-protein metabolism, not elsewhere classified: Secondary | ICD-10-CM | POA: Diagnosis not present

## 2021-04-18 DIAGNOSIS — Z823 Family history of stroke: Secondary | ICD-10-CM

## 2021-04-18 DIAGNOSIS — E875 Hyperkalemia: Secondary | ICD-10-CM | POA: Diagnosis present

## 2021-04-18 DIAGNOSIS — E1040 Type 1 diabetes mellitus with diabetic neuropathy, unspecified: Secondary | ICD-10-CM | POA: Diagnosis present

## 2021-04-18 DIAGNOSIS — Z833 Family history of diabetes mellitus: Secondary | ICD-10-CM

## 2021-04-18 DIAGNOSIS — Z781 Physical restraint status: Secondary | ICD-10-CM

## 2021-04-18 DIAGNOSIS — A419 Sepsis, unspecified organism: Secondary | ICD-10-CM | POA: Diagnosis present

## 2021-04-18 DIAGNOSIS — E1010 Type 1 diabetes mellitus with ketoacidosis without coma: Secondary | ICD-10-CM | POA: Diagnosis present

## 2021-04-18 DIAGNOSIS — Z885 Allergy status to narcotic agent status: Secondary | ICD-10-CM

## 2021-04-18 DIAGNOSIS — R339 Retention of urine, unspecified: Secondary | ICD-10-CM | POA: Diagnosis not present

## 2021-04-18 DIAGNOSIS — D649 Anemia, unspecified: Secondary | ICD-10-CM | POA: Diagnosis present

## 2021-04-18 DIAGNOSIS — D631 Anemia in chronic kidney disease: Secondary | ICD-10-CM | POA: Diagnosis present

## 2021-04-18 DIAGNOSIS — R601 Generalized edema: Secondary | ICD-10-CM | POA: Diagnosis present

## 2021-04-18 DIAGNOSIS — E7800 Pure hypercholesterolemia, unspecified: Secondary | ICD-10-CM | POA: Diagnosis present

## 2021-04-18 DIAGNOSIS — N184 Chronic kidney disease, stage 4 (severe): Secondary | ICD-10-CM | POA: Diagnosis present

## 2021-04-18 DIAGNOSIS — N179 Acute kidney failure, unspecified: Secondary | ICD-10-CM | POA: Diagnosis present

## 2021-04-18 DIAGNOSIS — F17210 Nicotine dependence, cigarettes, uncomplicated: Secondary | ICD-10-CM | POA: Diagnosis present

## 2021-04-18 DIAGNOSIS — T68XXXA Hypothermia, initial encounter: Secondary | ICD-10-CM

## 2021-04-18 DIAGNOSIS — E860 Dehydration: Secondary | ICD-10-CM | POA: Diagnosis present

## 2021-04-18 DIAGNOSIS — N183 Chronic kidney disease, stage 3 unspecified: Secondary | ICD-10-CM | POA: Diagnosis present

## 2021-04-18 DIAGNOSIS — E10649 Type 1 diabetes mellitus with hypoglycemia without coma: Secondary | ICD-10-CM | POA: Diagnosis not present

## 2021-04-18 DIAGNOSIS — R338 Other retention of urine: Secondary | ICD-10-CM

## 2021-04-18 DIAGNOSIS — Z8349 Family history of other endocrine, nutritional and metabolic diseases: Secondary | ICD-10-CM

## 2021-04-18 DIAGNOSIS — Z79899 Other long term (current) drug therapy: Secondary | ICD-10-CM

## 2021-04-18 DIAGNOSIS — R4182 Altered mental status, unspecified: Secondary | ICD-10-CM | POA: Diagnosis present

## 2021-04-18 DIAGNOSIS — H5704 Mydriasis: Secondary | ICD-10-CM

## 2021-04-18 DIAGNOSIS — R188 Other ascites: Secondary | ICD-10-CM | POA: Diagnosis present

## 2021-04-18 DIAGNOSIS — E871 Hypo-osmolality and hyponatremia: Secondary | ICD-10-CM

## 2021-04-18 DIAGNOSIS — Z8249 Family history of ischemic heart disease and other diseases of the circulatory system: Secondary | ICD-10-CM

## 2021-04-18 DIAGNOSIS — Z91199 Patient's noncompliance with other medical treatment and regimen due to unspecified reason: Secondary | ICD-10-CM

## 2021-04-18 DIAGNOSIS — E872 Acidosis, unspecified: Secondary | ICD-10-CM

## 2021-04-18 DIAGNOSIS — R9431 Abnormal electrocardiogram [ECG] [EKG]: Secondary | ICD-10-CM | POA: Diagnosis present

## 2021-04-18 DIAGNOSIS — Z20822 Contact with and (suspected) exposure to covid-19: Secondary | ICD-10-CM | POA: Diagnosis present

## 2021-04-18 DIAGNOSIS — E1110 Type 2 diabetes mellitus with ketoacidosis without coma: Secondary | ICD-10-CM

## 2021-04-18 DIAGNOSIS — Z794 Long term (current) use of insulin: Secondary | ICD-10-CM

## 2021-04-18 DIAGNOSIS — J690 Pneumonitis due to inhalation of food and vomit: Secondary | ICD-10-CM | POA: Diagnosis present

## 2021-04-18 DIAGNOSIS — I129 Hypertensive chronic kidney disease with stage 1 through stage 4 chronic kidney disease, or unspecified chronic kidney disease: Secondary | ICD-10-CM | POA: Diagnosis present

## 2021-04-18 DIAGNOSIS — R079 Chest pain, unspecified: Secondary | ICD-10-CM

## 2021-04-18 DIAGNOSIS — J189 Pneumonia, unspecified organism: Secondary | ICD-10-CM | POA: Diagnosis present

## 2021-04-18 DIAGNOSIS — Z9114 Patient's other noncompliance with medication regimen: Secondary | ICD-10-CM

## 2021-04-18 DIAGNOSIS — E1022 Type 1 diabetes mellitus with diabetic chronic kidney disease: Secondary | ICD-10-CM | POA: Diagnosis present

## 2021-04-18 DIAGNOSIS — J029 Acute pharyngitis, unspecified: Secondary | ICD-10-CM

## 2021-04-18 DIAGNOSIS — R9389 Abnormal findings on diagnostic imaging of other specified body structures: Secondary | ICD-10-CM

## 2021-04-18 HISTORY — DX: Metabolic encephalopathy: G93.41

## 2021-04-18 LAB — CBC WITH DIFFERENTIAL/PLATELET
Abs Immature Granulocytes: 0.05 10*3/uL (ref 0.00–0.07)
Basophils Absolute: 0 10*3/uL (ref 0.0–0.1)
Basophils Relative: 0 %
Eosinophils Absolute: 0 10*3/uL (ref 0.0–0.5)
Eosinophils Relative: 0 %
HCT: 25.9 % — ABNORMAL LOW (ref 36.0–46.0)
Hemoglobin: 8.6 g/dL — ABNORMAL LOW (ref 12.0–15.0)
Immature Granulocytes: 1 %
Lymphocytes Relative: 10 %
Lymphs Abs: 0.9 10*3/uL (ref 0.7–4.0)
MCH: 30.4 pg (ref 26.0–34.0)
MCHC: 33.2 g/dL (ref 30.0–36.0)
MCV: 91.5 fL (ref 80.0–100.0)
Monocytes Absolute: 0.3 10*3/uL (ref 0.1–1.0)
Monocytes Relative: 3 %
Neutro Abs: 8.1 10*3/uL — ABNORMAL HIGH (ref 1.7–7.7)
Neutrophils Relative %: 86 %
Platelets: 378 10*3/uL (ref 150–400)
RBC: 2.83 MIL/uL — ABNORMAL LOW (ref 3.87–5.11)
RDW: 13.9 % (ref 11.5–15.5)
WBC: 9.4 10*3/uL (ref 4.0–10.5)
nRBC: 0 % (ref 0.0–0.2)

## 2021-04-18 LAB — CBG MONITORING, ED
Glucose-Capillary: >600 mg/dL (ref 70–99)
Glucose-Capillary: >600 mg/dL (ref 70–99)
Glucose-Capillary: >600 mg/dL (ref 70–99)
Glucose-Capillary: >600 mg/dL (ref 70–99)
Glucose-Capillary: >600 mg/dL (ref 70–99)
Glucose-Capillary: >600 mg/dL (ref 70–99)
Glucose-Capillary: >600 mg/dL (ref 70–99)

## 2021-04-18 LAB — BLOOD GAS, VENOUS
Acid-base deficit: 25.1 mmol/L — ABNORMAL HIGH (ref 0.0–2.0)
Bicarbonate: 3.7 mmol/L — ABNORMAL LOW (ref 20.0–28.0)
O2 Saturation: 80.1 %
Patient temperature: 98.6
pCO2, Ven: 12.8 mmHg — CL (ref 44.0–60.0)
pH, Ven: 7.088 — CL (ref 7.250–7.430)
pO2, Ven: 53.5 mmHg — ABNORMAL HIGH (ref 32.0–45.0)

## 2021-04-18 LAB — BASIC METABOLIC PANEL
Anion gap: UNDETERMINED (ref 5–15)
Anion gap: UNDETERMINED (ref 5–15)
BUN: 63 mg/dL — ABNORMAL HIGH (ref 6–20)
BUN: 64 mg/dL — ABNORMAL HIGH (ref 6–20)
CO2: <7 mmol/L — ABNORMAL LOW (ref 22–32)
CO2: <7 mmol/L — ABNORMAL LOW (ref 22–32)
Calcium: 8.7 mg/dL — ABNORMAL LOW (ref 8.9–10.3)
Calcium: 8.3 mg/dL — ABNORMAL LOW (ref 8.9–10.3)
Chloride: 93 mmol/L — ABNORMAL LOW (ref 98–111)
Chloride: 96 mmol/L — ABNORMAL LOW (ref 98–111)
Creatinine, Ser: 4.73 mg/dL — ABNORMAL HIGH (ref 0.44–1.00)
Creatinine, Ser: 4.55 mg/dL — ABNORMAL HIGH (ref 0.44–1.00)
GFR, Estimated: 12 mL/min — ABNORMAL LOW (ref >60)
GFR, Estimated: 12 mL/min — ABNORMAL LOW (ref >60)
Glucose, Bld: 1153 mg/dL (ref 70–99)
Glucose, Bld: 1156 mg/dL (ref 70–99)
Potassium: 7.2 mmol/L (ref 3.5–5.1)
Potassium: 6.5 mmol/L (ref 3.5–5.1)
Sodium: 123 mmol/L — ABNORMAL LOW (ref 135–145)
Sodium: 125 mmol/L — ABNORMAL LOW (ref 135–145)

## 2021-04-18 LAB — I-STAT BETA HCG BLOOD, ED (MC, WL, AP ONLY): I-stat hCG, quantitative: 5.5 m[IU]/mL — ABNORMAL HIGH (ref <5)

## 2021-04-18 LAB — ETHANOL: Alcohol, Ethyl (B): <10 mg/dL (ref <10)

## 2021-04-18 LAB — MAGNESIUM: Magnesium: 2.3 mg/dL (ref 1.7–2.4)

## 2021-04-18 LAB — RESP PANEL BY RT-PCR (FLU A&B, COVID) ARPGX2
Influenza A by PCR: NEGATIVE
Influenza B by PCR: NEGATIVE
SARS Coronavirus 2 by RT PCR: NEGATIVE

## 2021-04-18 LAB — BETA-HYDROXYBUTYRIC ACID: Beta-Hydroxybutyric Acid: 15.98 mmol/L — ABNORMAL HIGH (ref 0.05–0.27)

## 2021-04-18 MED ORDER — INSULIN REGULAR(HUMAN) IN NACL 100-0.9 UT/100ML-% IV SOLN
INTRAVENOUS | Status: DC
  Administered 2021-04-18: 4.8 [IU]/h via INTRAVENOUS
  Filled 2021-04-18: qty 100

## 2021-04-18 MED ORDER — LACTATED RINGERS IV BOLUS
20.0000 mL/kg | Freq: Once | INTRAVENOUS | Status: AC
  Administered 2021-04-18: 1058 mL via INTRAVENOUS

## 2021-04-18 MED ORDER — CALCIUM GLUCONATE-NACL 1-0.675 GM/50ML-% IV SOLN
1.0000 g | Freq: Once | INTRAVENOUS | Status: AC
  Administered 2021-04-18: 1000 mg via INTRAVENOUS
  Filled 2021-04-18: qty 50

## 2021-04-18 MED ORDER — DEXTROSE IN LACTATED RINGERS 5 % IV SOLN: INTRAVENOUS | Status: DC

## 2021-04-18 MED ORDER — DEXTROSE 50 % IV SOLN: 0.0000 mL | INTRAVENOUS | Status: DC | PRN

## 2021-04-18 MED ORDER — INSULIN ASPART 100 UNIT/ML IJ SOLN
20.0000 [IU] | Freq: Once | INTRAMUSCULAR | Status: AC
  Administered 2021-04-18: 20 [IU] via SUBCUTANEOUS
  Filled 2021-04-18: qty 0.2

## 2021-04-18 MED ORDER — LACTATED RINGERS IV SOLN: INTRAVENOUS | Status: DC

## 2021-04-18 NOTE — ED Provider Notes (Signed)
[facility] EMERGENCY DEPARTMENT Provider Note  CSN: [id number] Arrival date & time: 04/18/21 1709    History Chief Complaint  Patient presents with   Hyperglycemia    Ann Robertson is a 30 y.o. female with known history of poorly controlled DM was recently admitted for hypokalemia, anemia and HTN, Discharged about a week ago. She was brought in by EMS from home for AMS and hyperglycemia. She is not cooperative and is unable to give any further history. She is moving, rolling and not allowing attempts at IV placement.    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   Neuropathy    Preeclampsia    STD (sexually transmitted disease)     Past Surgical History:  Procedure Laterality Date   CESAREAN SECTION     CESAREAN SECTION N/A 07/05/2013   Procedure: CESAREAN SECTION;  Surgeon: Emily Filbert, MD;  Location: [facility];  Service: Obstetrics;  Laterality: N/A;   CESAREAN SECTION N/A 07/25/2014   Procedure: CESAREAN SECTION;  Surgeon: Donnamae Jude, MD;  Location: [facility];  Service: Obstetrics;  Laterality: N/A;   CESAREAN SECTION N/A 11/21/2015   Procedure: CESAREAN SECTION;  Surgeon: Donnamae Jude, MD;  Location: [facility];  Service: Obstetrics;  Laterality: N/A;   WISDOM TOOTH EXTRACTION      Family History  Problem Relation Age of Onset   Stroke Mother    Hypertension Mother    Heart disease Mother    Kidney disease Mother    Hyperlipidemia Father    Diabetes Father     Social History   Tobacco Use   Smoking status: Every Day    Packs/day: 0.25    Years: 9.00    Pack years: 2.25    Types: Cigarettes   Smokeless tobacco: Never  Substance Use Topics   Alcohol use: Yes    Comment: occasional   Drug use: No     Home Medications Prior to Admission medications   Medication Sig Start Date End Date Taking? Authorizing Provider  amLODipine (NORVASC) 10 MG tablet Take 1 tablet (10 mg total) by  mouth daily. 04/13/21  Yes Patrecia Pour, MD  enalapril (VASOTEC) 5 MG tablet Take 5 mg by mouth 2 (two) times daily. 03/18/21  Yes [provider]  insulin glargine (LANTUS SOLOSTAR) 100 UNIT/ML Solostar Pen Inject 5 Units into the skin daily. And pen needles 1/day Patient taking differently: Inject 2-5 Units into the skin daily as needed (for a high BGL). 02/02/21  Yes Dwyane Dee, MD  insulin lispro (HUMALOG) 100 UNIT/ML KwikPen Inject 2-5 Units into the skin 3 (three) times daily as needed (for a high BGL). Per sliding scale   Yes [provider]  labetalol (NORMODYNE) 100 MG tablet Take 100 mg by mouth 2 (two) times daily. 03/26/21  Yes [provider]  methocarbamol (ROBAXIN) 750 MG tablet Take 750 mg by mouth every 8 (eight) hours as needed for muscle spasms. 03/16/21  Yes [provider]  omeprazole (PRILOSEC) 20 MG capsule Take 20 mg by mouth daily.   Yes [provider]  oxyCODONE-acetaminophen (PERCOCET) 10-325 MG tablet Take 1 tablet by mouth every 4 (four) hours as needed for pain. 02/13/21  Yes [provider]  potassium chloride SA (KLOR-CON) 20 MEQ tablet Take 1 tablet (20 mEq total) by mouth 2 (two) times daily. 04/12/21  Yes Patrecia Pour, MD  rosuvastatin ([place])  20 MG tablet Take 20 mg by mouth at bedtime. 03/16/21  Yes [provider]  torsemide (DEMADEX) 20 MG tablet Take 1 tablet (20 mg total) by mouth 2 (two) times daily. 02/21/21 05/22/21 Yes Dahal, Marlowe Aschoff, MD  bisacodyl (DULCOLAX) 10 MG suppository Place 1 suppository (10 mg total) rectally daily as needed for moderate constipation. Patient not taking: Reported on 04/18/2021 02/21/21   Terrilee Croak, MD  Continuous Blood Gluc Sensor (DEXCOM G6 SENSOR) MISC Apply topically. Patient not taking: Reported on 04/18/2021 03/12/21   [provider]  Continuous Blood Gluc Sensor (DEXCOM G6 SENSOR) MISC 1 (ONE) EACH AS DIRECTED Patient not taking: Reported on  04/18/2021 03/12/21   [provider]  Continuous Blood Gluc Transmit (DEXCOM G6 TRANSMITTER) MISC USE AS DIRECTED EVERY 3 MONTHS Patient not taking: Reported on 04/18/2021 02/22/21   [provider]  gabapentin (NEURONTIN) 600 MG tablet Take 0.5 tablets (300 mg total) by mouth 3 (three) times daily as needed (pain). Patient not taking: Reported on 04/18/2021 04/12/21   Patrecia Pour, MD  Insulin Pen Needle (PEN NEEDLES) 30G X 8 MM MISC 1 each by Does not apply route daily. E11.9 05/14/19   Renato Shin, MD  ondansetron (ZOFRAN) 4 MG tablet Take 1 tablet (4 mg total) by mouth every 8 (eight) hours as needed for nausea or vomiting. Patient not taking: Reported on 04/18/2021 06/10/20   Renato Shin, MD  Vitamin D, Ergocalciferol, (DRISDOL) 1.25 MG (50000 UNIT) CAPS capsule Take 50,000 Units by mouth every Tuesday. Patient not taking: Reported on 04/18/2021    [provider]     Allergies    Hydromorphone hcl   Review of Systems   Review of Systems Unable to assess due to mental status.    Physical Exam BP 113/71   Pulse (!) 101   Temp (!) 94.7 F (34.8 C) (Rectal)   Resp (!) 24   SpO2 100%   Physical Exam Vitals and nursing note reviewed.  Constitutional:      Appearance: Normal appearance.     Comments: somnolent  HENT:     Head: Normocephalic and atraumatic.     Nose: Nose normal.     Mouth/Throat:     Mouth: Mucous membranes are dry.  Eyes:     Extraocular Movements: Extraocular movements intact.     Conjunctiva/sclera: Conjunctivae normal.  Cardiovascular:     Rate and Rhythm: Tachycardia present.  Pulmonary:     Effort: Pulmonary effort is normal.     Breath sounds: Normal breath sounds.     Comments: tachypnea Abdominal:     General: Abdomen is flat.     Palpations: Abdomen is soft.     Tenderness: There is no abdominal tenderness.  Musculoskeletal:        General: No swelling. Normal range of motion.     Cervical back: Neck  supple.  Skin:    General: Skin is warm and dry.  Neurological:     Comments: Minimal responsiveness, babbling nonsense, moves all extremities  Psychiatric:     Comments: agitated     ED Results / Procedures / Treatments   Labs (all labs ordered are listed, but only abnormal results are displayed) Labs Reviewed  BASIC METABOLIC PANEL - Abnormal; Notable for the following components:      Result Value   Sodium 123 (*)    Potassium 7.2 (*)    Chloride 93 (*)    CO2 <7 (*)    Glucose, Bld 1,153 (*)  BUN 63 (*)    Creatinine, Ser 4.73 (*)    Calcium 8.7 (*)    GFR, Estimated 12 (*)    All other components within normal limits  BETA-HYDROXYBUTYRIC ACID - Abnormal; Notable for the following components:   Beta-Hydroxybutyric Acid 15.98 (*)    All other components within normal limits  CBC WITH DIFFERENTIAL/PLATELET - Abnormal; Notable for the following components:   RBC 2.83 (*)    Hemoglobin 8.6 (*)    HCT 25.9 (*)    Neutro Abs 8.1 (*)    All other components within normal limits  BLOOD GAS, VENOUS - Abnormal; Notable for the following components:   pH, Ven 7.088 (*)    pCO2, Ven 12.8 (*)    pO2, Ven 53.5 (*)    Bicarbonate 3.7 (*)    Acid-base deficit 25.1 (*)    All other components within normal limits  I-STAT BETA HCG BLOOD, ED (MC, [facility], AP ONLY) - Abnormal; Notable for the following components:   I-stat hCG, quantitative 5.5 (*)    All other components within normal limits  CBG MONITORING, ED - Abnormal; Notable for the following components:   Glucose-Capillary >600 (*)    All other components within normal limits  CBG MONITORING, ED - Abnormal; Notable for the following components:   Glucose-Capillary >600 (*)    All other components within normal limits  RESP PANEL BY RT-PCR (FLU A&B, COVID) ARPGX2  ETHANOL  MAGNESIUM  BASIC METABOLIC PANEL  BASIC METABOLIC PANEL  BASIC METABOLIC PANEL  BETA-HYDROXYBUTYRIC ACID  RAPID URINE DRUG SCREEN, HOSP PERFORMED   URINALYSIS, COMPLETE (UACMP) WITH MICROSCOPIC  PREGNANCY, URINE    EKG EKG Interpretation  Date/Time:  Saturday April 18 2021 17:48:57 EST Ventricular Rate:  102 PR Interval:    QRS Duration: 109 QT Interval:  368 QTC Calculation: 480 R Axis:   109 Text Interpretation: Sinus tachycardia Borderline right axis deviation Low voltage, extremity and precordial leads Borderline prolonged QT interval Since last tracing QT has shortened Confirmed by Calvert Cantor [phone]) on 04/18/2021 5:56:13 PM  Radiology No results found.  Procedures .Critical Care Performed by: Truddie Hidden, MD Authorized by: Truddie Hidden, MD   Critical care provider statement:    Critical care time (minutes):  60   Critical care time was exclusive of:  Separately billable procedures and treating other patients   Critical care was necessary to treat or prevent imminent or life-threatening deterioration of the following conditions:  Endocrine crisis   Critical care was time spent personally by me on the following activities:  Development of treatment plan with patient or surrogate, discussions with consultants, evaluation of patient's response to treatment, examination of patient, ordering and review of laboratory studies, ordering and review of radiographic studies, ordering and performing treatments and interventions, pulse oximetry, re-evaluation of patient's condition, review of old charts and vascular access procedures   Care discussed with: admitting provider    18ga L EJ placed for access, no complications  Medications Ordered in the ED Medications  insulin regular, human (MYXREDLIN) 100 units/ 100 mL infusion (4.8 Units/hr Intravenous New Bag/Given 04/18/21 1900)  lactated ringers infusion (has no administration in time range)  dextrose 5 % in lactated ringers infusion ( Intravenous New Bag/Given 04/18/21 1929)  dextrose 50 % solution 0-50 mL (has no administration in time range)  calcium  gluconate 1 g/ 50 mL sodium chloride IVPB (1,000 mg Intravenous New Bag/Given 04/18/21 2010)  lactated ringers bolus 1,058 mL (1,058 mLs Intravenous  New Bag/Given 04/18/21 1750)     MDM Rules/Calculators/A&P MDM  Patient with likely DKA based on presentation and history. Has recently had hypokalemia, will check labs before starting insulin. Restraints for her uncooperativeness. Maintaining airway at the time of my evaluation.  ED Course  I have reviewed the triage vital signs and the nursing notes.  Pertinent labs & imaging results that were available during my care of the patient were reviewed by me and considered in my medical decision making (see chart for details).  Clinical Course as of 04/18/21 2015  Sat Apr 18, 2021  1817 VBG with significant acidosis, consistent with suspected DKA. CBC with no leukocytosis, Hgb is improved from recent admission where she received a transfusion [CS]  1826 I-stat bHCG is borderline positive, but low suspicion for true pregnancy. Will check Urine HCG to confirm.  [CS]  1834 CBG is >600, Trop is neg.  [CS]  1848 BMP with marked hyperglycemia, K is elevated today, so will begin insulin drip. Moderate increase in Cr from baseline. No acute EKG of hyperglycemia. Will discuss with ICU.  [CS]  1937 Spoke with ICU who did not feel the patient needed to be on their service. Requested Hospitalist admit.  [CS]  2003 Spoke with Dr. Tonie Griffith, Hospitalist, who will come evaluate the patient.  [CS]    Clinical Course User Index [CS] Truddie Hidden, MD    Final Clinical Impression(s) / ED Diagnoses Final diagnoses:  Diabetic ketoacidosis without coma associated with type 1 diabetes mellitus ([place])  Hyperkalemia  Acute renal failure superimposed on chronic kidney disease, unspecified CKD stage, unspecified acute renal failure type [facility])    Rx / DC Orders ED Discharge Orders     None        Truddie Hidden, MD 04/18/21 2015

## 2021-04-18 NOTE — ED Notes (Signed)
MD Chotiner contacted via messager about endotool

## 2021-04-18 NOTE — ED Notes (Signed)
Bair hugger and blankets applied for rectal temp 94.7

## 2021-04-18 NOTE — ED Notes (Signed)
Pt continues to yell, when asking what pt needs, pt not able to communicate what she is yelling about

## 2021-04-18 NOTE — ED Notes (Signed)
CN made aware of endotool keeping 4.8u insulin despite persistent HIGH reading. CN confirms settings are correct in endotool and to page hospitalist if does not change at next accucheck

## 2021-04-18 NOTE — ED Notes (Addendum)
Pt sat up to semi fowlers. Pt still screaming to take IV out of neck and take everything off of her. Pt not a/o, still considered a risk to herself with pulling lines and tubes

## 2021-04-18 NOTE — ED Notes (Signed)
Patient transported to CT 

## 2021-04-18 NOTE — H&P (Signed)
History and Physical    Ann Robertson CHY:[id number] DOB: Mar 12, 1991 DOA: 04/18/2021  PCP: [facility] [place]   Patient coming from: Home  Chief Complaint: Altered mental status, elevated blood sugar readings  HPI: Ann Robertson is a 30 y.o. female with medical history significant for type 1 diabetes mellitus, neuropathy secondary to diabetes, noncompliant with diabetes care who presents by EMS from home with altered mental status and elevated blood sugar readings.  According to chart patient was recently admitted for hypokalemia, anemia and hypertension but was discharged a week ago.  Ann Robertson is not able to provide any history secondary to altered mental status.  No family is at bedside.  She mumbles incoherently and does not follow commands.  Four-point restraints in the emergency room.   ED Course: Ann Robertson has had tachycardia and mild hypothermia with temperature of 94 degrees in the emergency room.  She been placed on a Retail banker.  She is been placed on DKA protocol with insulin infusion.  She is found to have hyperkalemia of potassium 7.2.  VBG shows pH 7.088 PCO2 12.8 PO2 53.5 bicarb 3.7.  Sodium 123 but is corrected to 141 corrected for hyperglycemia.  Potassium 7.2 chloride 93 bicarb less than 7 glucose 1153 creatinine 4.73 from baseline of 2.5-3 BUN 63 calcium 8.7.  WBC 9400 hemoglobin 8.6 hematocrit 25.9 platelets 378,000, Droxia butyrate 15.98, beta hCG 5.5.  Initially the ER physician called PCCM for admission to ICU and the patient with DKA with altered mental status.  PCCM declined the admission.  Hospitalist service was asked to admit for further management.  As DKA with encephalopathy higher risk condition if she does not turn around quickly will consult PCCM for their input.  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   Neuropathy     Preeclampsia    STD (sexually transmitted disease)     Past Surgical History:  Procedure Laterality Date   CESAREAN SECTION     CESAREAN SECTION N/A 07/05/2013   Procedure: CESAREAN SECTION;  Surgeon: Emily Filbert, MD;  Location: [facility];  Service: Obstetrics;  Laterality: N/A;   CESAREAN SECTION N/A 07/25/2014   Procedure: CESAREAN SECTION;  Surgeon: Donnamae Jude, MD;  Location: [facility];  Service: Obstetrics;  Laterality: N/A;   CESAREAN SECTION N/A 11/21/2015   Procedure: CESAREAN SECTION;  Surgeon: Donnamae Jude, MD;  Location: [facility];  Service: Obstetrics;  Laterality: N/A;   WISDOM TOOTH EXTRACTION      Social History  reports that she has been smoking cigarettes. She has a 2.25 pack-year smoking history. She has never used smokeless tobacco. She reports current alcohol use. She reports that she does not use drugs.  Allergies  Allergen Reactions   Hydromorphone Hcl Itching and Rash    Family History  Problem Relation Age of Onset   Stroke Mother    Hypertension Mother    Heart disease Mother    Kidney disease Mother    Hyperlipidemia Father    Diabetes Father      Prior to Admission medications   Medication Sig Start Date End Date Taking? Authorizing Provider  amLODipine (NORVASC) 10 MG tablet Take 1 tablet (10 mg total) by mouth daily. 04/13/21  Yes Patrecia Pour, MD  enalapril (VASOTEC) 5 MG tablet Take 5 mg by mouth 2 (two) times daily. 03/18/21  Yes [provider]  insulin glargine (LANTUS SOLOSTAR) 100 UNIT/ML Solostar Pen Inject 5 Units into the skin daily. And pen needles 1/day Patient taking differently: Inject 2-5 Units into the skin daily as needed (for a high BGL). 02/02/21  Yes Dwyane Dee, MD  insulin lispro (HUMALOG) 100 UNIT/ML KwikPen Inject 2-5 Units into the skin 3 (three) times daily as needed (for a high BGL). Per sliding scale   Yes [provider]  labetalol (NORMODYNE) 100 MG tablet Take 100 mg by mouth 2 (two) times  daily. 03/26/21  Yes [provider]  methocarbamol (ROBAXIN) 750 MG tablet Take 750 mg by mouth every 8 (eight) hours as needed for muscle spasms. 03/16/21  Yes [provider]  omeprazole (PRILOSEC) 20 MG capsule Take 20 mg by mouth daily.   Yes [provider]  oxyCODONE-acetaminophen (PERCOCET) 10-325 MG tablet Take 1 tablet by mouth every 4 (four) hours as needed for pain. 02/13/21  Yes [provider]  potassium chloride SA (KLOR-CON) 20 MEQ tablet Take 1 tablet (20 mEq total) by mouth 2 (two) times daily. 04/12/21  Yes Patrecia Pour, MD  rosuvastatin ([place]) 20 MG tablet Take 20 mg by mouth at bedtime. 03/16/21  Yes [provider]  torsemide (DEMADEX) 20 MG tablet Take 1 tablet (20 mg total) by mouth 2 (two) times daily. 02/21/21 05/22/21 Yes Dahal, Marlowe Aschoff, MD  bisacodyl (DULCOLAX) 10 MG suppository Place 1 suppository (10 mg total) rectally daily as needed for moderate constipation. Patient not taking: Reported on 04/18/2021 02/21/21   Terrilee Croak, MD  Continuous Blood Gluc Sensor (DEXCOM G6 SENSOR) MISC Apply topically. Patient not taking: Reported on 04/18/2021 03/12/21   [provider]  Continuous Blood Gluc Sensor (DEXCOM G6 SENSOR) MISC 1 (ONE) EACH AS DIRECTED Patient not taking: Reported on 04/18/2021 03/12/21   [provider]  Continuous Blood Gluc Transmit (DEXCOM G6 TRANSMITTER) MISC USE AS DIRECTED EVERY 3 MONTHS Patient not taking: Reported on 04/18/2021 02/22/21   [provider]  gabapentin (NEURONTIN) 600 MG tablet Take 0.5 tablets (300 mg total) by mouth 3 (three) times daily as needed (pain). Patient not taking: Reported on 04/18/2021 04/12/21   Patrecia Pour, MD  Insulin Pen Needle (PEN NEEDLES) 30G X 8 MM MISC 1 each by Does not apply route daily. E11.9 05/14/19   Renato Shin, MD  ondansetron (ZOFRAN) 4 MG tablet Take 1 tablet (4 mg total) by mouth every 8 (eight) hours as needed for nausea or  vomiting. Patient not taking: Reported on 04/18/2021 06/10/20   Renato Shin, MD  Vitamin D, Ergocalciferol, (DRISDOL) 1.25 MG (50000 UNIT) CAPS capsule Take 50,000 Units by mouth every Tuesday. Patient not taking: Reported on 04/18/2021    [provider]    Physical Exam: Vitals:   04/18/21 1915 04/18/21 1930 04/18/21 1945 04/18/21 2000  BP: 100/72 104/71 115/72 113/71  Pulse: 95 97 99 (!) 101  Resp: (!) 23 (!) 24 (!) 28 (!) 24  Temp:      TempSrc:      SpO2: 100% 100% 100% 100%    Constitutional: NAD, calm, comfortable Vitals:   04/18/21 1915 04/18/21 1930 04/18/21 1945 04/18/21 2000  BP: 100/72 104/71 115/72 113/71  Pulse: 95 97 99 (!) 101  Resp: (!) 23 (!) 24 (!) 28 (!) 24  Temp:      TempSrc:      SpO2: 100% 100% 100% 100%   General: WDWN, Alert but not oriented and mumbles incoherently  Eyes: PERRL, conjunctivae normal.  Sclera nonicteric HENT:  [place]/AT, external ears normal.  Nares patent without epistasis.  Mucous membranes are dry. Posterior pharynx clear   Neck: Soft, normal range of motion, supple, no masses, no thyromegaly.  Trachea midline Respiratory: clear to auscultation bilaterally, no wheezing, no crackles. Normal respiratory effort. No accessory muscle use.  Cardiovascular: Regular rate and rhythm, no murmurs / rubs / gallops. No extremity edema. 2+ pedal pulses.  Abdomen: Soft, nondistended, no rebound or guarding.  No masses palpated. Bowel sounds normoactive Musculoskeletal:  Upper and lower extremities normal appearance with restraints in place. no cyanosis. No joint deformity upper and lower extremities. Normal muscle tone.  Skin: Warm, dry, intact no rashes, lesions, ulcers. No induration Neurologic: Cannot accurate assess due to acute condition.  Babinski downgoing bilaterally.  Withdraws from painful stimuli.   Labs on Admission: I have personally reviewed following labs and imaging studies  CBC: Recent Labs  Lab 04/12/21 0222  04/18/21 1742  WBC 5.2 9.4  NEUTROABS  --  8.1*  HGB 8.2* 8.6*  HCT 23.4* 25.9*  MCV 86.3 91.5  PLT 232 124    Basic Metabolic Panel: Recent Labs  Lab 04/12/21 0222 04/18/21 1742  NA 131* 123*  K 3.6 7.2*  CL 103 93*  CO2 22 <7*  GLUCOSE 190* 1,153*  BUN 19 63*  CREATININE 3.13* 4.73*  CALCIUM 6.7* 8.7*  MG 2.3 2.3    GFR: Estimated Creatinine Clearance: 14.3 mL/min (A) (by C-G formula based on SCr of 4.73 mg/dL (H)).  Liver Function Tests: No results for input(s): AST, ALT, ALKPHOS, BILITOT, PROT, ALBUMIN in the last 168 hours.  Urine analysis:    Component Value Date/Time   COLORURINE STRAW (A) 04/11/2021 0224   APPEARANCEUR CLEAR 04/11/2021 0224   LABSPEC 1.008 04/11/2021 0224   PHURINE 6.0 04/11/2021 0224   GLUCOSEU 150 (A) 04/11/2021 0224   HGBUR LARGE (A) 04/11/2021 0224   BILIRUBINUR NEGATIVE 04/11/2021 0224   KETONESUR NEGATIVE 04/11/2021 0224   PROTEINUR 100 (A) 04/11/2021 0224   UROBILINOGEN 0.2 11/06/2015 1011   NITRITE NEGATIVE 04/11/2021 0224   LEUKOCYTESUR NEGATIVE 04/11/2021 0224    Radiological Exams on Admission: No results found.  EKG: Independently reviewed.  EKG shows sinus tachycardia with no acute ST elevation or depression.  QT interval mildly prolonged at 480  Assessment/Plan Principal Problem:   DKA, type 1  Ann Robertson is admitted to ICU on DKA protocol. Insulin infusion has been initiated. Monitor blood glucose and electrolytes closely.  Will wean insulin infusion as indicated when blood glucose improves. Initial blood glucose over 1100. IVF hydration.  Will need DM educator and social work to help identify barriers to her diabetes self management and improve compliance.  Active Problems:   Acute metabolic encephalopathy Obtain Stat CT head to further evaluate for potential cerebral edema. If does not improve with treatment of DKA will consult PCCM and neurology.   Pt is currently in 4 point safety restraints which will be  continued until mental status improves to prevent self injury.     Hyperkalemia Pt given calcium gluconate in ER. Monitor potassium and electrolytes closely.     Acute renal failure superimposed on stage 3 chronic kidney disease, unspecified acute renal failure type, unspecified whether stage 3a or 3b CKD IVF hydration with DKA protocol.  Check BMP every 4 hours.     Hypothermia Is on Bearhugger. Continue to monitor temperature.    Prolonged QT interval Avoid medications which could further  prolong QT interval    Noncompliance with diabetes treatment Patient will need diabetes educator and social work to assist with barriers to patient's compliance with diabetic treatment and care    DVT prophylaxis: Heparin for DVT prophylaxis.   Code Status:   Full Code  Family Communication:  No family at bedside and pt with encephalopathy. No answer when attempted to call family.  Disposition Plan:   Patient is from:  Home  Anticipated DC to:  Home  Anticipated DC date:  Anticipate 2 midnight or more stay in hospital to treat acute condition  Admission status:  Inpatient   Yevonne Aline Aireal Slater MD [facility]  How to contact the [facility] Attending or Consulting provider [place] or covering provider during after hours [place], for this patient?   Check the care team in [facility] and look for a) attending/consulting [facility] provider listed and b) the [facility] team listed Log into [email or web address] and use [place] universal password to access. If you do not have the password, please contact the hospital operator. Locate the [facility] provider you are looking for under [facility] and page to a number that you can be directly reached. If you still have difficulty reaching the provider, please page the [facility] (Director on Call) for the Hospitalists listed on amion for assistance.  04/18/2021, 8:18 PM

## 2021-04-18 NOTE — ED Triage Notes (Signed)
BIB EMS for AMS with hyperglycemia. Pt known DM type 1

## 2021-04-18 NOTE — ED Notes (Signed)
Pt return ffrom CT

## 2021-04-18 NOTE — ED Notes (Signed)
Pt restrained per MD Karle Starch as pt is ripping off all wires and leads.

## 2021-04-18 NOTE — ED Notes (Signed)
Pt has +2-3 peripheral edema to legs. Pt keeps placing legs between siderails creating deep indentation. Legs repositioned multiple times

## 2021-04-18 NOTE — ED Notes (Addendum)
MD Chotiner messages back, states to give 20u Novolog now and repeat BMP. Also to increase insulin infusion to 6u.,

## 2021-04-18 NOTE — ED Notes (Signed)
Family updated as to patient's status.

## 2021-04-18 NOTE — ED Notes (Signed)
Pt continuing to move around in bed, speaking nonsense, screaming. Attempt to inform pt why she is here and why she has restraints on.

## 2021-04-19 ENCOUNTER — Inpatient Hospital Stay (HOSPITAL_COMMUNITY): Payer: Medicaid Other

## 2021-04-19 DIAGNOSIS — R338 Other retention of urine: Secondary | ICD-10-CM

## 2021-04-19 DIAGNOSIS — N183 Chronic kidney disease, stage 3 unspecified: Secondary | ICD-10-CM | POA: Insufficient documentation

## 2021-04-19 DIAGNOSIS — E871 Hypo-osmolality and hyponatremia: Secondary | ICD-10-CM

## 2021-04-19 DIAGNOSIS — G9341 Metabolic encephalopathy: Secondary | ICD-10-CM

## 2021-04-19 DIAGNOSIS — H5704 Mydriasis: Secondary | ICD-10-CM

## 2021-04-19 LAB — BASIC METABOLIC PANEL
Anion gap: UNDETERMINED (ref 5–15)
Anion gap: 17 — ABNORMAL HIGH (ref 5–15)
Anion gap: 14 (ref 5–15)
Anion gap: 9 (ref 5–15)
Anion gap: 8 (ref 5–15)
BUN: 62 mg/dL — ABNORMAL HIGH (ref 6–20)
BUN: 60 mg/dL — ABNORMAL HIGH (ref 6–20)
BUN: 58 mg/dL — ABNORMAL HIGH (ref 6–20)
BUN: 56 mg/dL — ABNORMAL HIGH (ref 6–20)
BUN: 52 mg/dL — ABNORMAL HIGH (ref 6–20)
CO2: <7 mmol/L — ABNORMAL LOW (ref 22–32)
CO2: 11 mmol/L — ABNORMAL LOW (ref 22–32)
CO2: 15 mmol/L — ABNORMAL LOW (ref 22–32)
CO2: 19 mmol/L — ABNORMAL LOW (ref 22–32)
CO2: 21 mmol/L — ABNORMAL LOW (ref 22–32)
Calcium: 8.7 mg/dL — ABNORMAL LOW (ref 8.9–10.3)
Calcium: 8.4 mg/dL — ABNORMAL LOW (ref 8.9–10.3)
Calcium: 8.5 mg/dL — ABNORMAL LOW (ref 8.9–10.3)
Calcium: 8.7 mg/dL — ABNORMAL LOW (ref 8.9–10.3)
Calcium: 8.8 mg/dL — ABNORMAL LOW (ref 8.9–10.3)
Chloride: 97 mmol/L — ABNORMAL LOW (ref 98–111)
Chloride: 100 mmol/L (ref 98–111)
Chloride: 100 mmol/L (ref 98–111)
Chloride: 104 mmol/L (ref 98–111)
Chloride: 105 mmol/L (ref 98–111)
Creatinine, Ser: 4.35 mg/dL — ABNORMAL HIGH (ref 0.44–1.00)
Creatinine, Ser: 4.22 mg/dL — ABNORMAL HIGH (ref 0.44–1.00)
Creatinine, Ser: 4.24 mg/dL — ABNORMAL HIGH (ref 0.44–1.00)
Creatinine, Ser: 4.1 mg/dL — ABNORMAL HIGH (ref 0.44–1.00)
Creatinine, Ser: 3.62 mg/dL — ABNORMAL HIGH (ref 0.44–1.00)
GFR, Estimated: 13 mL/min — ABNORMAL LOW (ref >60)
GFR, Estimated: 14 mL/min — ABNORMAL LOW (ref >60)
GFR, Estimated: 14 mL/min — ABNORMAL LOW (ref >60)
GFR, Estimated: 14 mL/min — ABNORMAL LOW (ref >60)
GFR, Estimated: 16 mL/min — ABNORMAL LOW (ref >60)
Glucose, Bld: 976 mg/dL (ref 70–99)
Glucose, Bld: 790 mg/dL (ref 70–99)
Glucose, Bld: 682 mg/dL (ref 70–99)
Glucose, Bld: 478 mg/dL — ABNORMAL HIGH (ref 70–99)
Glucose, Bld: 207 mg/dL — ABNORMAL HIGH (ref 70–99)
Potassium: 5.4 mmol/L — ABNORMAL HIGH (ref 3.5–5.1)
Potassium: 4.5 mmol/L (ref 3.5–5.1)
Potassium: 4.5 mmol/L (ref 3.5–5.1)
Potassium: 4.4 mmol/L (ref 3.5–5.1)
Potassium: 4 mmol/L (ref 3.5–5.1)
Sodium: 127 mmol/L — ABNORMAL LOW (ref 135–145)
Sodium: 128 mmol/L — ABNORMAL LOW (ref 135–145)
Sodium: 129 mmol/L — ABNORMAL LOW (ref 135–145)
Sodium: 132 mmol/L — ABNORMAL LOW (ref 135–145)
Sodium: 134 mmol/L — ABNORMAL LOW (ref 135–145)

## 2021-04-19 LAB — LACTIC ACID, PLASMA
Lactic Acid, Venous: 3.9 mmol/L (ref 0.5–1.9)
Lactic Acid, Venous: 3.8 mmol/L (ref 0.5–1.9)
Lactic Acid, Venous: 3.2 mmol/L (ref 0.5–1.9)

## 2021-04-19 LAB — RAPID URINE DRUG SCREEN, HOSP PERFORMED
Amphetamines: NOT DETECTED
Barbiturates: NOT DETECTED
Benzodiazepines: NOT DETECTED
Cocaine: NOT DETECTED
Opiates: NOT DETECTED
Tetrahydrocannabinol: NOT DETECTED

## 2021-04-19 LAB — URINALYSIS, COMPLETE (UACMP) WITH MICROSCOPIC
Bilirubin Urine: NEGATIVE
Glucose, UA: >=500 mg/dL — AB
Ketones, ur: 20 mg/dL — AB
Leukocytes,Ua: NEGATIVE
Nitrite: NEGATIVE
Protein, ur: 100 mg/dL — AB
Specific Gravity, Urine: 1.014 (ref 1.005–1.030)
pH: 5 (ref 5.0–8.0)

## 2021-04-19 LAB — GLUCOSE, CAPILLARY
Glucose-Capillary: 103 mg/dL — ABNORMAL HIGH (ref 70–99)
Glucose-Capillary: 138 mg/dL — ABNORMAL HIGH (ref 70–99)
Glucose-Capillary: 148 mg/dL — ABNORMAL HIGH (ref 70–99)
Glucose-Capillary: 155 mg/dL — ABNORMAL HIGH (ref 70–99)
Glucose-Capillary: 180 mg/dL — ABNORMAL HIGH (ref 70–99)
Glucose-Capillary: 225 mg/dL — ABNORMAL HIGH (ref 70–99)
Glucose-Capillary: 236 mg/dL — ABNORMAL HIGH (ref 70–99)
Glucose-Capillary: 325 mg/dL — ABNORMAL HIGH (ref 70–99)
Glucose-Capillary: 362 mg/dL — ABNORMAL HIGH (ref 70–99)
Glucose-Capillary: 404 mg/dL — ABNORMAL HIGH (ref 70–99)
Glucose-Capillary: 415 mg/dL — ABNORMAL HIGH (ref 70–99)
Glucose-Capillary: 444 mg/dL — ABNORMAL HIGH (ref 70–99)
Glucose-Capillary: 455 mg/dL — ABNORMAL HIGH (ref 70–99)
Glucose-Capillary: 520 mg/dL (ref 70–99)
Glucose-Capillary: 551 mg/dL (ref 70–99)
Glucose-Capillary: 583 mg/dL (ref 70–99)
Glucose-Capillary: 69 mg/dL — ABNORMAL LOW (ref 70–99)
Glucose-Capillary: >600 mg/dL (ref 70–99)
Glucose-Capillary: >600 mg/dL (ref 70–99)
Glucose-Capillary: >600 mg/dL (ref 70–99)
Glucose-Capillary: >600 mg/dL (ref 70–99)
Glucose-Capillary: >600 mg/dL (ref 70–99)
Glucose-Capillary: >600 mg/dL (ref 70–99)
Glucose-Capillary: >600 mg/dL (ref 70–99)
Glucose-Capillary: >600 mg/dL (ref 70–99)
Glucose-Capillary: >600 mg/dL (ref 70–99)
Glucose-Capillary: >600 mg/dL (ref 70–99)
Glucose-Capillary: >600 mg/dL (ref 70–99)
Glucose-Capillary: >600 mg/dL (ref 70–99)
Glucose-Capillary: >600 mg/dL (ref 70–99)
Glucose-Capillary: >600 mg/dL (ref 70–99)
Glucose-Capillary: >600 mg/dL (ref 70–99)

## 2021-04-19 LAB — AMMONIA: Ammonia: 22 umol/L (ref 9–35)

## 2021-04-19 LAB — BETA-HYDROXYBUTYRIC ACID
Beta-Hydroxybutyric Acid: 10.83 mmol/L — ABNORMAL HIGH (ref 0.05–0.27)
Beta-Hydroxybutyric Acid: 3.6 mmol/L — ABNORMAL HIGH (ref 0.05–0.27)
Beta-Hydroxybutyric Acid: 0.14 mmol/L (ref 0.05–0.27)

## 2021-04-19 LAB — CBC WITH DIFFERENTIAL/PLATELET
Abs Immature Granulocytes: 0.06 10*3/uL (ref 0.00–0.07)
Basophils Absolute: 0 10*3/uL (ref 0.0–0.1)
Basophils Relative: 0 %
Eosinophils Absolute: 0 10*3/uL (ref 0.0–0.5)
Eosinophils Relative: 0 %
HCT: 23.5 % — ABNORMAL LOW (ref 36.0–46.0)
Hemoglobin: 8.3 g/dL — ABNORMAL LOW (ref 12.0–15.0)
Immature Granulocytes: 1 %
Lymphocytes Relative: 7 %
Lymphs Abs: 0.8 10*3/uL (ref 0.7–4.0)
MCH: 30.1 pg (ref 26.0–34.0)
MCHC: 35.3 g/dL (ref 30.0–36.0)
MCV: 85.1 fL (ref 80.0–100.0)
Monocytes Absolute: 0.8 10*3/uL (ref 0.1–1.0)
Monocytes Relative: 6 %
Neutro Abs: 11 10*3/uL — ABNORMAL HIGH (ref 1.7–7.7)
Neutrophils Relative %: 86 %
Platelets: 298 10*3/uL (ref 150–400)
RBC: 2.76 MIL/uL — ABNORMAL LOW (ref 3.87–5.11)
RDW: 13.7 % (ref 11.5–15.5)
WBC: 12.7 10*3/uL — ABNORMAL HIGH (ref 4.0–10.5)
nRBC: 0 % (ref 0.0–0.2)

## 2021-04-19 LAB — HEPATIC FUNCTION PANEL
ALT: 22 U/L (ref 0–44)
AST: 25 U/L (ref 15–41)
Albumin: 1.9 g/dL — ABNORMAL LOW (ref 3.5–5.0)
Alkaline Phosphatase: 95 U/L (ref 38–126)
Bilirubin, Direct: 0.1 mg/dL (ref 0.0–0.2)
Indirect Bilirubin: 0.8 mg/dL (ref 0.3–0.9)
Total Bilirubin: 0.9 mg/dL (ref 0.3–1.2)
Total Protein: 5 g/dL — ABNORMAL LOW (ref 6.5–8.1)

## 2021-04-19 LAB — HIV ANTIBODY (ROUTINE TESTING W REFLEX): HIV Screen 4th Generation wRfx: NONREACTIVE

## 2021-04-19 LAB — PREGNANCY, URINE: Preg Test, Ur: NEGATIVE

## 2021-04-19 LAB — TSH: TSH: 4.543 u[IU]/mL — ABNORMAL HIGH (ref 0.350–4.500)

## 2021-04-19 LAB — MRSA NEXT GEN BY PCR, NASAL: MRSA by PCR Next Gen: NOT DETECTED

## 2021-04-19 LAB — CBG MONITORING, ED: Glucose-Capillary: >600 mg/dL (ref 70–99)

## 2021-04-19 LAB — VITAMIN B12: Vitamin B-12: 1891 pg/mL — ABNORMAL HIGH (ref 180–914)

## 2021-04-19 MED ORDER — INSULIN REGULAR(HUMAN) IN NACL 100-0.9 UT/100ML-% IV SOLN
INTRAVENOUS | Status: DC
  Administered 2021-04-19: 8 [IU]/h via INTRAVENOUS
  Filled 2021-04-19: qty 100

## 2021-04-19 MED ORDER — HEPARIN SODIUM (PORCINE) 5000 UNIT/ML IJ SOLN
5000.0000 [IU] | Freq: Three times a day (TID) | INTRAMUSCULAR | Status: DC
  Administered 2021-04-19 – 2021-04-20 (×4): 5000 [IU] via SUBCUTANEOUS
  Filled 2021-04-19 (×8): qty 1

## 2021-04-19 MED ORDER — LORAZEPAM 2 MG/ML IJ SOLN
0.5000 mg | INTRAMUSCULAR | Status: DC | PRN
  Administered 2021-04-19 – 2021-04-22 (×10): 0.5 mg via INTRAVENOUS
  Filled 2021-04-19 (×11): qty 1

## 2021-04-19 MED ORDER — INSULIN GLARGINE-YFGN 100 UNIT/ML ~~LOC~~ SOLN
5.0000 [IU] | Freq: Every day | SUBCUTANEOUS | Status: DC
  Administered 2021-04-19 – 2021-04-21 (×3): 5 [IU] via SUBCUTANEOUS
  Filled 2021-04-19 (×4): qty 0.05

## 2021-04-19 MED ORDER — LACTATED RINGERS IV BOLUS
500.0000 mL | Freq: Once | INTRAVENOUS | Status: AC
  Administered 2021-04-19: 1000 mL via INTRAVENOUS

## 2021-04-19 MED ORDER — LACTATED RINGERS IV SOLN: INTRAVENOUS | Status: DC

## 2021-04-19 MED ORDER — LACTATED RINGERS IV BOLUS
1000.0000 mL | Freq: Once | INTRAVENOUS | Status: AC
  Administered 2021-04-19: 1000 mL via INTRAVENOUS

## 2021-04-19 MED ORDER — INSULIN ASPART 100 UNIT/ML IJ SOLN
10.0000 [IU] | Freq: Once | INTRAMUSCULAR | Status: AC
  Administered 2021-04-19: 10 [IU] via SUBCUTANEOUS

## 2021-04-19 MED ORDER — PIPERACILLIN-TAZOBACTAM 3.375 G IVPB
3.3750 g | Freq: Two times a day (BID) | INTRAVENOUS | Status: DC
  Administered 2021-04-19 – 2021-04-20 (×3): 3.375 g via INTRAVENOUS
  Filled 2021-04-19 (×3): qty 50

## 2021-04-19 MED ORDER — DEXTROSE 50 % IV SOLN
0.0000 mL | INTRAVENOUS | Status: DC | PRN
  Administered 2021-04-19: 25 mL via INTRAVENOUS
  Administered 2021-04-20: 50 mL via INTRAVENOUS
  Administered 2021-04-20: 25 mL via INTRAVENOUS
  Filled 2021-04-19 (×2): qty 50

## 2021-04-19 MED ORDER — INSULIN ASPART 100 UNIT/ML IJ SOLN: 0.0000 [IU] | INTRAMUSCULAR | Status: DC

## 2021-04-19 MED ORDER — FENTANYL CITRATE (PF) 100 MCG/2ML IJ SOLN
25.0000 ug | INTRAMUSCULAR | Status: DC | PRN
  Administered 2021-04-19 – 2021-04-22 (×20): 25 ug via INTRAVENOUS
  Filled 2021-04-19 (×20): qty 2

## 2021-04-19 MED ORDER — SODIUM CHLORIDE 0.9 % IV SOLN: INTRAVENOUS | Status: DC | PRN

## 2021-04-19 MED ORDER — PIPERACILLIN-TAZOBACTAM 3.375 G IVPB 30 MIN
3.3750 g | INTRAVENOUS | Status: AC
  Administered 2021-04-19: 3.375 g via INTRAVENOUS
  Filled 2021-04-19: qty 50

## 2021-04-19 MED ORDER — CHLORHEXIDINE GLUCONATE CLOTH 2 % EX PADS
6.0000 | MEDICATED_PAD | Freq: Every day | CUTANEOUS | Status: DC
  Administered 2021-04-19 – 2021-04-22 (×5): 6 via TOPICAL

## 2021-04-19 MED ORDER — DEXTROSE IN LACTATED RINGERS 5 % IV SOLN: INTRAVENOUS | Status: DC

## 2021-04-19 NOTE — Assessment & Plan Note (Addendum)
Noted by RN 11/20 AM Encephalopathic, moving all extremities Code stroke called Head CT motion degraded, presumed postoperative changes to L globe MRI brain motion degraded without acute abnormalities  Appreciate neurology recs, recommending glucose control, call back if continued encephalopathy  Of note, s/p PPV 25 GA/EL/MP OS, indirect laser ophthalmoscopy OD 04/14/2021 (see care everywhere note)

## 2021-04-19 NOTE — Progress Notes (Signed)
Inpatient Diabetes Program Recommendations  AACE/ADA: New Consensus Statement on Inpatient Glycemic Control (2015)  Target Ranges:  Prepandial:   less than 140 mg/dL      Peak postprandial:   less than 180 mg/dL (1-2 hours)      Critically ill patients:  140 - 180 mg/dL    Latest Reference Range & Units 04/18/21 17:42  Sodium 135 - 145 mmol/L 123 (L)  Potassium 3.5 - 5.1 mmol/L 7.2 (HH)  Chloride 98 - 111 mmol/L 93 (L)  CO2 22 - 32 mmol/L <7 (L)  Glucose 70 - 99 mg/dL 1,153 (HH)  BUN 6 - 20 mg/dL 63 (H)  Creatinine 0.44 - 1.00 mg/dL 4.73 (H)  Calcium 8.9 - 10.3 mg/dL 8.7 (L)  Anion gap 5 - 15  UNABLE TO CALCULATE.    Latest Reference Range & Units 04/18/21 18:00  Beta-Hydroxybutyric Acid 0.05 - 0.27 mmol/L 15.98 (H)    Latest Reference Range & Units 06/10/20 09:27 09/10/20 13:35 01/26/21 22:10 04/11/21 06:26  Hemoglobin A1C 4.8 - 5.6 % 11.9 ! >15 12.0 (H) 6.9 (H)    Latest Reference Range & Units 04/19/21 04:30 04/19/21 05:05 04/19/21 05:29 04/19/21 05:55 04/19/21 06:35 04/19/21 07:03 04/19/21 07:38 04/19/21 08:15 04/19/21 09:12 04/19/21 09:50  Glucose-Capillary 70 - 99 mg/dL >600 (HH)  IV Insulin Drip started 1858 11/19 >600 (HH) >600 (HH) >600 (HH) >600 (HH) >600 (HH) >600 (HH) 583 (HH) >600 (HH) 551 (HH)  IV Insulin Drip remains Infusing    Admit with: DKA  History: Type 1 Diabetes, CKD  Home DM Meds: Dexcom Sensor (NOT using)        Lantus 2-5 units Daily PRN for high CBG        Humalog 2-5 units TID        Per Home Med Rec, pt has not filled Lantus nor Humalog in 5+ months  Current Orders: IV Insulin Drip    Just hospitalized from 11/11 thru 11/13 for: Anemia Hypertensive urgency Hypokalemia Acute renal failure superimposed on stage 3 chronic kidney disease   Endocrinologist: Dr. Janese Banks with [facility] Initial consult 03/09/2021 Was instructed to take the following: Diabetes regimen: -Continue long acting insulin at 5 units per day, ideally take same time every  day ~9 am -Adjust insulin to carb ratio to 1 unit for every 30 grams carbohydrates -Adjust correction scale to the following when eating and when not eating: If BG 200-250 - take 1 additional unit If BG 251- 300- take 2 additional units If BG 301 -350 - take 3 additional units If BG 351 - 400 - take 4 additional units  If BG > 400 - take 4 units total  Was counseled by the Diabetes Coordinator in August 2022 but per notes, pt was not very receptive to conversation and seemed to lack the understanding of the severity of her chronic disease and uncontrolled CBGs.    --Will follow patient during hospitalization--  Wyn Quaker RN, MSN, CDE Diabetes Coordinator Inpatient Glycemic Control Team Team Pager: [phone] (8a-5p)

## 2021-04-19 NOTE — Assessment & Plan Note (Signed)
Due to hyperglycemia.  

## 2021-04-19 NOTE — Assessment & Plan Note (Addendum)
Baseline creatinine unclear, looks like somewhere around 2? Though was lower in 1's earlier in 2022. Creatinine 4.73 at presentation Improved to 3.1 today, anasarca on exam Continue lasix + albumin Renal US without hydro (limited exam), small debris in bladder Suspect aki at presentation 2/2 severe dehydration with DKA

## 2021-04-19 NOTE — Assessment & Plan Note (Addendum)
Severe acidemia at presentation as well as severe hyperglycemia Now resolved Hypoglycemic last night Follow on basal, SSI, mealtime She wants regular diet as this is what she eats at home, will continue for now Unclear precipitating factor, w/u for infection, add abx

## 2021-04-19 NOTE — Assessment & Plan Note (Addendum)
Chronic Follow anemia labs - AOCD downtrending Will repeat

## 2021-04-19 NOTE — Assessment & Plan Note (Addendum)
SIRS criteria with developing leukocytosis, tachycardia, tachypnea, hypothermia at presentation.  Infection appears to have been pneumonia. CXR 11/22 with findings concerning for pneumonia Blood cultures NGTD UA not concerning for UTI Follow urine culture insignificant growth Negative MRSA PCR CT abd/pelvis with anasarca, small volume ascites, small bilateral effusions Given critical illness, treat with zosyn presumptively, continue for now

## 2021-04-19 NOTE — Progress Notes (Signed)
Pharmacy Antibiotic Note  Ann Robertson is a 29 y.o. female admitted on 04/18/2021 with sepsis.  Pharmacy has been consulted for Zosyn dosing.  Plan: Zosyn 3.375 g IV stat over 30 minutes then Zosyn 3.375 g infused over 4 hours every 12 hours Monitor clinical progress, renal function F/U C&S, abx deescalation / LOT   Height: 5\' 4"  (162.6 cm) Weight: 60.9 kg (134 lb 4.2 oz) IBW/kg (Calculated) : 54.7  Temp (24hrs), Avg:96.7 F (35.9 C), Min:94.4 F (34.7 C), Max:98.7 F (37.1 C)  Recent Labs  Lab 04/18/21 1742 04/18/21 1951 04/19/21 0117 04/19/21 0520 04/19/21 0826  WBC 9.4  --   --   --  12.7*  CREATININE 4.73* 4.55* 4.35* 4.22* 4.24*    Estimated Creatinine Clearance: 16.4 mL/min (A) (by C-G formula based on SCr of 4.24 mg/dL (H)).    Allergies  Allergen Reactions   Hydromorphone Hcl Itching and Rash    Antimicrobials this admission: 11/20 Zosyn >>   Dose adjustments this admission:   Microbiology results: 11/20 BCx: ordered 11/20 MRSA PCR: not detected  Thank you for allowing pharmacy to be a part of this patient's care.  Royetta Asal, PharmD, BCPS Clinical Pharmacist [place] Please utilize Amion for appropriate phone number to reach the unit pharmacist ([place]) 04/19/2021 10:17 AM

## 2021-04-19 NOTE — Assessment & Plan Note (Addendum)
resolved 

## 2021-04-19 NOTE — Consult Note (Addendum)
[facility] Telemedicine Consult   Requesting Provider: Dr. Florene Glen Consult Participants: Dr. Jerelyn Charles, Telespecialist RN Verdis Frederickson   bedside RN at [facility]  Location of the provider: [facility] Location of the patient: [place] long bed 1237  This consult was provided via telemedicine with 2-way video and audio communication. The patient/family was informed that care would be provided in this way and agreed to receive care in this manner.    Chief Complaint: Unreactive pupils bilaterally  HPI: 30 year old woman, past medical history of type 1 diabetes, neuropathy, noncompliant with medications presented to the emergency room on 04/18/2021 with complaints of altered mental status via EMS, with blood sugars unreadable on the glucometers.  Admitted for hyperglycemia and DKA work-up.  Mental status has been reported as confused ever since her arrival.  Being aggressively treated for management of DKA with insulin.  Stat neurology consult/code stroke consultation was obtained when this morning it was noted that her pupils are nonreactive bilaterally. Patient is awake, appears confused, is pulling on her lines, is in wrist restraints and attempting to get out of bed.  She is unable to provide any history herself. Her sugars still remain elevated despite of aggressive insulin and fluid administration. Of note she is also had some eye procedures done recently-seen in Care Everywhere.   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   Neuropathy    Preeclampsia    STD (sexually transmitted disease)      Current Facility-Administered Medications:    Chlorhexidine Gluconate Cloth 2 % PADS 6 each, 6 each, Topical, Q0600, Adhikari, Amrit, MD, 6 each at 04/19/21 0048   dextrose 5 % in lactated ringers infusion, , Intravenous, Continuous, Chotiner, Yevonne Aline, MD   dextrose 50 % solution 0-50 mL, 0-50 mL, Intravenous, PRN,  Chotiner, Yevonne Aline, MD   heparin injection 5,000 Units, 5,000 Units, Subcutaneous, Q8H, Chotiner, Yevonne Aline, MD, 5,000 Units at 04/19/21 0556   insulin regular, human (MYXREDLIN) 100 units/ 100 mL infusion, , Intravenous, Continuous, Chotiner, Yevonne Aline, MD, Last Rate: 6 mL/hr at 04/19/21 0055, 6 Units/hr at 04/19/21 0055   lactated ringers infusion, , Intravenous, Continuous, Chotiner, Yevonne Aline, MD, Last Rate: 125 mL/hr at 04/19/21 0641, Infusion Verify at 04/19/21 0641    LKW: Prior to arrival to the ER-unknown last well time tpa given?: No, unknown last well time IR Thrombectomy? No, exam not consistent with LVO, unclear last known well time Modified Rankin-unable to ascertain Time of teleneurologist evaluation: 3212  Exam: Vitals:   04/19/21 0700 04/19/21 0758  BP: 130/76   Pulse: (!) 111   Resp: 10   Temp:  98.7 F (37.1 C)  SpO2: 99%     General: Patient is writhing in bed, keeps eyes open at times and other times closed. Neurological exam Laying in bed with wrist restraints attempting to take the restraints off. Responds to voice by trying to track but does not answer with appropriate responses. When asked her name, she started telling me her date of birth. Speech is mildly dysarthric Extremely poor attention concentration Cranial nerves: Pupils are equal round and unreactive or extremely sluggishly reactive to light to the best of my review on the camera zoomed in both and daylight mode and night mode, she was able to look to both sides-not on command but when her name was being called by the care team.  Her face appears symmetric on the camera. Motor  examination: She is moving all her extremities with good strength requiring wrist restraints to keep her restrained.  Almost looked like she has generalized polymyoclonus/asterixis during the camera exam. Sensation: Difficult to assess   NIHSS 1A: Level of Consciousness - 1 1B: Ask Month and Age - 2 1C: 'Blink Eyes' &  'Squeeze Hands' - 2 2: Test Horizontal Extraocular Movements - 0 3: Test Visual Fields - 0 4: Test Facial Palsy - 0 5A: Test Left Arm Motor Drift - 0 5B: Test Right Arm Motor Drift - 0 6A: Test Left Leg Motor Drift - 0 6B: Test Right Leg Motor Drift - 0 7: Test Limb Ataxia - 0 8: Test Sensation - 0 9: Test Language/Aphasia- 0 10: Test Dysarthria - 1 11: Test Extinction/Inattention - 0 NIHSS score: 6   Imaging Reviewed: CT head was extremely difficult to perform-extremely motion riddled-aspects 10.  No acute changes.  Stable presumed recent postoperative changes to the left globe with gas within the vitreous  Labs reviewed in epic and pertinent values follow: CBC    Component Value Date/Time   WBC 9.4 04/18/2021 1742   RBC 2.83 (L) 04/18/2021 1742   HGB 8.6 (L) 04/18/2021 1742   HGB 12.7 06/06/2015 0000   HCT 25.9 (L) 04/18/2021 1742   HCT 39 06/06/2015 0000   PLT 378 04/18/2021 1742   PLT 383 06/06/2015 0000   MCV 91.5 04/18/2021 1742   MCH 30.4 04/18/2021 1742   MCHC 33.2 04/18/2021 1742   RDW 13.9 04/18/2021 1742   LYMPHSABS 0.9 04/18/2021 1742   MONOABS 0.3 04/18/2021 1742   EOSABS 0.0 04/18/2021 1742   BASOSABS 0.0 04/18/2021 1742   CMP     Component Value Date/Time   NA 128 (L) 04/19/2021 0520   K 4.5 04/19/2021 0520   CL 100 04/19/2021 0520   CO2 11 (L) 04/19/2021 0520   GLUCOSE 790 (HH) 04/19/2021 0520   BUN 60 (H) 04/19/2021 0520   CREATININE 4.22 (H) 04/19/2021 0520   CREATININE 0.64 11/06/2015 1125   CALCIUM 8.4 (L) 04/19/2021 0520   PROT 4.8 (L) 04/10/2021 2017   ALBUMIN 1.5 (L) 04/10/2021 2017   AST 17 04/10/2021 2017   ALT 10 04/10/2021 2017   ALKPHOS 75 04/10/2021 2017   BILITOT 0.4 04/10/2021 2017   GFRNONAA 14 (L) 04/19/2021 0520   GFRAA >60 02/18/2019 1752     Assessment: 30 year old woman with type 1 diabetes noncompliance to medication presenting with altered mental status, noted to be in DKA being treated with insulin and fluids noted  to have nonreactive pupils bilaterally. She is awake, extremely encephalopathic and has nonfocal exam. I suspect that her pupillary nonreactivity is secondary to her metabolic derangements. CT does not show evidence of cerebral edema within the limitations of motion. Noncontrasted head CT was difficult to perform given her agitation and confusion but does not show a large bleed or large evolving infarct. I suspect that her current condition is secondary to toxic metabolic encephalopathy in the setting of DKA and acutely deranged renal function. That said, she would benefit from getting an MRI to rule out acute changes related with osmolar shifts, edema as well as rule out a stroke.  Also to be borne in mind as she has some eye procedures done-details in Care Everywhere.  Pupillary response abnormalities might be related to some pre-existing ophthalmological issues as well but given the myriad of serious medical problems such as DKA, it would be worthwhile to ensure that we are not  missing cerebral/brainstem etiology.  Impression Toxic metabolic encephalopathy Evaluate for changes related to osmotic shifts, cerebral edema with DKA in the brain with an MRI when able to. Rule out stroke-less likely   Recommendations:  I would avoid sedation as much as possible so as to not lose the exam. Very low doses of Ativan can be used to see if that makes her more comfortable and amenable to get an MRI.  There is no rush to get an MRI. Aggressive management of her DKA per primary team as you are. I would also check an ammonia level given the asterixis although the asterixis/polymyoclonus might be secondary to the deranged renal function in the DKA itself. Correction of toxic metabolic derangements per primary team as you are  Please feel free to call with questions if she continues to deteriorate or has sudden depressed level of consciousness as that could indicate increase in intracranial pressure or  cerebral edema.  Neurohospitalist team from [facility] neurology will follow with you.  Discussed with attending hospitalist Dr. Florene Glen over the camera.   -- Amie Portland, MD [facility] Pager: [phone] If 7pm to 7am, please call on call as listed on AMION.  CRITICAL CARE ATTESTATION Performed by: Amie Portland, MD Total critical care time: 50 minutes Critical care time was exclusive of separately billable procedures and treating other patients and/or supervising APPs/Residents/Students Critical care was necessary to treat or prevent imminent or life-threatening deterioration due to toxic metabolic encephalopathy This patient is critically ill and at significant risk for neurological worsening and/or death and care requires constant monitoring. Critical care was time spent personally by me on the following activities: Telemedicine evaluation, development of treatment plan with patient and/or surrogate as well as nursing, discussions with consultants, evaluation of patient's response to treatment, examination of patient, obtaining history from patient or surrogate, ordering and performing treatments and interventions, ordering and review of laboratory studies, ordering and review of radiographic studies, pulse oximetry, re-evaluation of patient's condition, participation in multidisciplinary rounds and medical decision making of high complexity in the care of this patient.  ADDENDUM MRI brain completed. Reviewed personally. Motion degraded but no large stroke. No infarct. No evidence of cerebral edema. Still encephalopathic per primary attending. At this time, mainstay of treatment is glucose control. If she continues to be encephalopathic, please call inpatient neurology service back as needed. We will be available PRN. D/W Dr Florene Glen  -- Amie Portland, MD Neurologist [facility] Pager: [phone]

## 2021-04-19 NOTE — Assessment & Plan Note (Addendum)
Due to acute illness Follow ammonia (wnl), b12 (elevated), folate (wnl), rpr (non reactive), hiv (not reactive), TSH (mildly elevated) Delirium precautions She's improving If not resolving, may need to discuss again with neurology

## 2021-04-19 NOTE — Progress Notes (Signed)
PROGRESS NOTE    Ann Robertson  XBJ:[id number] DOB: 13-Dec-1990 DOA: 04/18/2021 PCP: [facility]   Chief Complaint  Patient presents with   Hyperglycemia    Brief Narrative:  Ann Robertson is Ann Robertson 30 y.o. female with medical history significant for type 1 diabetes mellitus, neuropathy secondary to diabetes, noncompliant with diabetes care who presents by EMS from home with altered mental status and elevated blood sugar readings.  According to chart patient was recently admitted for hypokalemia, anemia and hypertension but was discharged Ann Robertson week ago.  Ann Robertson is not able to provide any history secondary to altered mental status.  No family is at bedside.  She mumbles incoherently and does not follow commands.  Four-point restraints in the emergency room.   Assessment & Plan:   Principal Problem:   DKA, type 1 ([place]) Active Problems:   Noncompliance with diabetes treatment   Prolonged QT interval   CKD (chronic kidney disease), stage III (HCC)   Bilateral fixed dilatation of pupils   DKA (diabetic ketoacidosis) (HCC)   Acute renal failure superimposed on stage 3 chronic kidney disease, unspecified acute renal failure type, unspecified whether stage 3a or 3b CKD ([place])   Hypothermia   Acute metabolic encephalopathy   Hyperkalemia   Anemia   Hyponatremia   Acute urinary retention  Bilateral fixed dilatation of pupils Noted by RN this AM Encephalopathic, moving all extremities Code stroke Head CT, appreciate neurology recs  Of note, s/p PPV 25 GA/EL/MP OS, indirect laser ophthalmoscopy OD 04/14/2021 (see care everywhere note)    DKA (diabetic ketoacidosis) ([place]) Severe acidemia  BG gradually improving on gtt Continue gtt per protocol Unclear precipitating factor, w/u for infection, add abx  Hypothermia SIRS criteria with developing leukocytosis, tachycardia, tachypnea, hypothermia at presentation. Given critical illness, treat with zosyn presumptively Blood  cx, UA, urine culture  Acute renal failure superimposed on stage 3 chronic kidney disease, unspecified acute renal failure type, unspecified whether stage 3a or 3b CKD ([place]) Baseline creatinine unclear, looks like somewhere around 2? Though was lower in 1's earlier in 2022. Continue IVF Creatinine 4.73 at presentation Follow UA, renal US Improving with IVF Suspect 2/2 severe dehydration with DKA  Hyperkalemia Improving with treatment of DKA, trend  Acute metabolic encephalopathy Due to acute illness Follow ammonia, b12, folate, rpr, hiv, TSH Delirium precautions  Acute urinary retention Follow S/p I/O Urine culture pending  Hyponatremia Due to hyperglycemia  Anemia Chronic Follow anemia labs  DVT prophylaxis: heparin  Code Status: full  Family Communication: father Disposition:   Status is: Inpatient  Remains inpatient appropriate because: need for IV insulin       Consultants:  neurology  Procedures:  none  Antimicrobials:  Anti-infectives (From admission, onward)    None          Subjective: Confused, agitated  Objective: Vitals:   04/19/21 0500 04/19/21 0600 04/19/21 0700 04/19/21 0758  BP: 120/65 (!) 155/83 130/76   Pulse: (!) 112 (!) 119 (!) 111   Resp: (!) 21 14 10    Temp:  98.1 F (36.7 C)  98.7 F (37.1 C)  TempSrc:  Axillary  Axillary  SpO2: 99% 99% 99%   Weight:      Height:        Intake/Output Summary (Last 24 hours) at 04/19/2021 0949 Last data filed at 04/19/2021 0641 Gross per 24 hour  Intake 751.13 ml  Output 1075 ml  Net -323.87 ml   Filed Weights   04/19/21 0100  Weight:  60.9 kg    Examination:  General exam: Appears agitated and delirious  Respiratory system: unlabored Cardiovascular system: RRR Gastrointestinal system: Abdomen is nondistended, soft and nontender Central nervous system: disoriented, answers some questions and follows some commands, but generally confused, repetitive speech, pupils appear  fixed, 4-5 mm Extremities: moving all extremities Skin: No rashes, lesions or ulcers Psychiatry: Judgement and insight appear impaired    Data Reviewed: I have personally reviewed following labs and imaging studies  CBC: Recent Labs  Lab 04/18/21 1742 04/19/21 0826  WBC 9.4 12.7*  NEUTROABS 8.1* 11.0*  HGB 8.6* 8.3*  HCT 25.9* 23.5*  MCV 91.5 85.1  PLT 378 951    Basic Metabolic Panel: Recent Labs  Lab 04/18/21 1742 04/18/21 1951 04/19/21 0117 04/19/21 0520 04/19/21 0826  NA 123* 125* 127* 128* 129*  K 7.2* 6.5* 5.4* 4.5 4.5  CL 93* 96* 97* 100 100  CO2 <7* <7* <7* 11* 15*  GLUCOSE 1,153* 1,156* 976* 790* 682*  BUN 63* 64* 62* 60* 58*  CREATININE 4.73* 4.55* 4.35* 4.22* 4.24*  CALCIUM 8.7* 8.3* 8.7* 8.4* 8.5*  MG 2.3  --   --   --   --     GFR: Estimated Creatinine Clearance: 16.4 mL/min (Ann Robertson) (by C-G formula based on SCr of 4.24 mg/dL (H)).  Liver Function Tests: No results for input(s): AST, ALT, ALKPHOS, BILITOT, PROT, ALBUMIN in the last 168 hours.  CBG: Recent Labs  Lab 04/19/21 0635 04/19/21 0703 04/19/21 0738 04/19/21 0815 04/19/21 0912  GLUCAP >600* >600* >600* 583* >600*     Recent Results (from the past 240 hour(s))  Resp Panel by RT-PCR (Flu Ann Robertson&B, Covid) Nasopharyngeal Swab     Status: None   Collection Time: 04/10/21 11:27 PM   Specimen: Nasopharyngeal Swab; Nasopharyngeal(NP) swabs in vial transport medium  Result Value Ref Range Status   SARS Coronavirus 2 by RT PCR NEGATIVE NEGATIVE Final    Comment: (NOTE) SARS-CoV-2 target nucleic acids are NOT DETECTED.  The SARS-CoV-2 RNA is generally detectable in upper respiratory specimens during the acute phase of infection. The lowest concentration of SARS-CoV-2 viral copies this assay can detect is 138 copies/mL. Ann Robertson negative result does not preclude SARS-Cov-2 infection and should not be used as the sole basis for treatment or other patient management decisions. Ann Robertson negative result may occur  with  improper specimen collection/handling, submission of specimen other than nasopharyngeal swab, presence of viral mutation(s) within the areas targeted by this assay, and inadequate number of viral copies(<138 copies/mL). Ann Robertson negative result must be combined with clinical observations, patient history, and epidemiological information. The expected result is Negative.  Fact Sheet for Patients:  EntrepreneurPulse.com.au  Fact Sheet for Healthcare Providers:  IncredibleEmployment.be  This test is no t yet approved or cleared by the [place] FDA and  has been authorized for detection and/or diagnosis of SARS-CoV-2 by FDA under an Emergency Use Authorization (EUA). This EUA will remain  in effect (meaning this test can be used) for the duration of the COVID-19 declaration under Section 564(b)(1) of the Act, 21 U.S.C.section 360bbb-3(b)(1), unless the authorization is terminated  or revoked sooner.       Influenza Luiza Carranco by PCR NEGATIVE NEGATIVE Final   Influenza B by PCR NEGATIVE NEGATIVE Final    Comment: (NOTE) The Xpert Xpress SARS-CoV-2/FLU/RSV plus assay is intended as an aid in the diagnosis of influenza from Nasopharyngeal swab specimens and should not be used as Cierah Crader sole basis for treatment. Nasal washings and aspirates  are unacceptable for Xpert Xpress SARS-CoV-2/FLU/RSV testing.  Fact Sheet for Patients: EntrepreneurPulse.com.au  Fact Sheet for Healthcare Providers: IncredibleEmployment.be  This test is not yet approved or cleared by the [place] FDA and has been authorized for detection and/or diagnosis of SARS-CoV-2 by FDA under an Emergency Use Authorization (EUA). This EUA will remain in effect (meaning this test can be used) for the duration of the COVID-19 declaration under Section 564(b)(1) of the Act, 21 U.S.C. section 360bbb-3(b)(1), unless the authorization is terminated  or revoked.  Performed at [facility], [place] [address] [place], [place] [address]   Resp Panel by RT-PCR (Flu Alannis Hsia&B, Covid) Nasopharyngeal Swab     Status: None   Collection Time: 04/18/21  6:00 PM   Specimen: Nasopharyngeal Swab; Nasopharyngeal(NP) swabs in vial transport medium  Result Value Ref Range Status   SARS Coronavirus 2 by RT PCR NEGATIVE NEGATIVE Final    Comment: (NOTE) SARS-CoV-2 target nucleic acids are NOT DETECTED.  The SARS-CoV-2 RNA is generally detectable in upper respiratory specimens during the acute phase of infection. The lowest concentration of SARS-CoV-2 viral copies this assay can detect is 138 copies/mL. Vondra Aldredge negative result does not preclude SARS-Cov-2 infection and should not be used as the sole basis for treatment or other patient management decisions. Dandrae Kustra negative result may occur with  improper specimen collection/handling, submission of specimen other than nasopharyngeal swab, presence of viral mutation(s) within the areas targeted by this assay, and inadequate number of viral copies(<138 copies/mL). Evon Dejarnett negative result must be combined with clinical observations, patient history, and epidemiological information. The expected result is Negative.  Fact Sheet for Patients:  EntrepreneurPulse.com.au  Fact Sheet for Healthcare Providers:  IncredibleEmployment.be  This test is no t yet approved or cleared by the [place] FDA and  has been authorized for detection and/or diagnosis of SARS-CoV-2 by FDA under an Emergency Use Authorization (EUA). This EUA will remain  in effect (meaning this test can be used) for the duration of the COVID-19 declaration under Section 564(b)(1) of the Act, 21 U.S.C.section 360bbb-3(b)(1), unless the authorization is terminated  or revoked sooner.       Influenza Raffael Bugarin by PCR NEGATIVE NEGATIVE Final   Influenza B by PCR NEGATIVE NEGATIVE Final    Comment: (NOTE) The Xpert Xpress  SARS-CoV-2/FLU/RSV plus assay is intended as an aid in the diagnosis of influenza from Nasopharyngeal swab specimens and should not be used as Iverson Sees sole basis for treatment. Nasal washings and aspirates are unacceptable for Xpert Xpress SARS-CoV-2/FLU/RSV testing.  Fact Sheet for Patients: EntrepreneurPulse.com.au  Fact Sheet for Healthcare Providers: IncredibleEmployment.be  This test is not yet approved or cleared by the [place] FDA and has been authorized for detection and/or diagnosis of SARS-CoV-2 by FDA under an Emergency Use Authorization (EUA). This EUA will remain in effect (meaning this test can be used) for the duration of the COVID-19 declaration under Section 564(b)(1) of the Act, 21 U.S.C. section 360bbb-3(b)(1), unless the authorization is terminated or revoked.  Performed at [facility], [place] [address], [place],  [address]   MRSA Next Gen by PCR, Nasal     Status: None   Collection Time: 04/19/21  1:00 AM   Specimen: Nasal Mucosa; Nasal Swab  Result Value Ref Range Status   MRSA by PCR Next Gen NOT DETECTED NOT DETECTED Final    Comment: (NOTE) The GeneXpert MRSA Assay (FDA approved for NASAL specimens only), is one component of [facility] comprehensive MRSA colonization surveillance  program. It is not intended to diagnose MRSA infection nor to guide or monitor treatment for MRSA infections. Test performance is not FDA approved in patients less than 68 years old. Performed at [facility], [place] [address], [place], [place] [address]          Radiology Studies: CT HEAD WO CONTRAST (5MM)  Result Date: 04/18/2021 CLINICAL DATA:  Altered mental status and hyperglycemia EXAM: CT HEAD WITHOUT CONTRAST TECHNIQUE: Contiguous axial images were obtained from the base of the skull through the vertex without intravenous contrast. COMPARISON:  None. FINDINGS: Brain: No evidence of acute  infarction, hemorrhage, hydrocephalus, extra-axial collection or mass lesion/mass effect. Vascular: No hyperdense vessel or unexpected calcification. Skull: Normal. Negative for fracture or focal lesion. Sinuses/Orbits: No acute finding. Other: None. IMPRESSION: No acute intracranial abnormality noted. Electronically Signed   By: Inez Catalina M.D.   On: 04/18/2021 21:37   CT HEAD CODE STROKE WO CONTRAST  Addendum Date: 04/19/2021   ADDENDUM REPORT: 04/19/2021 09:21 ADDENDUM: Study discussed by telephone with Dr. Hulen Luster JR on 04/19/2021 at 0905 hours. And he determined the patient had left-side ophthalmic surgery in September. Electronically Signed   By: Genevie Ann M.D.   On: 04/19/2021 09:21   Result Date: 04/19/2021 CLINICAL DATA:  Code stroke.  30 year old female with uneven pupils. EXAM: CT HEAD WITHOUT CONTRAST TECHNIQUE: Contiguous axial images were obtained from the base of the skull through the vertex without intravenous contrast. COMPARISON:  Head CT without contrast yesterday. FINDINGS: Brain: Intermittent motion artifact. Normal cerebral volume. No midline shift, ventriculomegaly, mass effect, evidence of mass lesion, intracranial hemorrhage or evidence of cortically based acute infarction. Gray-white matter differentiation is within normal limits throughout the brain. Vascular: Mild Calcified atherosclerosis at the skull base. No suspicious intracranial vascular hyperdensity. Skull: Motion artifact at the skull base. No acute osseous abnormality identified. Sinuses/Orbits: Visualized paranasal sinuses and mastoids are stable and well aerated. Other: Stable presumed postoperative changes to the left globe with gas within the vitreous. Negative visible scalp. ASPECTS [facility] Stroke Program Early CT Score) Total score (0-10 with 10 being normal): 10 IMPRESSION: 1. Stable and normal noncontrast CT appearance of the brain when allowing for motion artifact today. ASPECTS 10. 2. Stable presumed recent  postoperative changes to the left globe with gas within the vitreous. Electronically Signed: By: Genevie Ann M.D. On: 04/19/2021 09:02        Scheduled Meds:  Chlorhexidine Gluconate Cloth  6 each Topical Q0600   heparin  5,000 Units Subcutaneous Q8H   Continuous Infusions:  dextrose 5% lactated ringers     insulin 8 Units/hr (04/19/21 0920)   lactated ringers 125 mL/hr at 04/19/21 0921     LOS: 1 day    Time spent: over 30 min    Fayrene Helper, MD [facility]   To contact the attending provider between 7A-7P or the covering provider during after hours 7P-7A, please log into the web site [email or web address] and access using universal [place] password for that web site. If you do not have the password, please call the hospital operator.  04/19/2021, 9:49 AM

## 2021-04-19 NOTE — Assessment & Plan Note (Addendum)
Follow S/p I/O Foley now in place, due to chronic diabetes most likely Consider removing foley within next day or so

## 2021-04-20 ENCOUNTER — Inpatient Hospital Stay (HOSPITAL_COMMUNITY): Payer: Medicaid Other

## 2021-04-20 DIAGNOSIS — E872 Acidosis, unspecified: Secondary | ICD-10-CM

## 2021-04-20 LAB — MAGNESIUM: Magnesium: 1.6 mg/dL — ABNORMAL LOW (ref 1.7–2.4)

## 2021-04-20 LAB — CBC WITH DIFFERENTIAL/PLATELET
Abs Immature Granulocytes: 0.08 10*3/uL — ABNORMAL HIGH (ref 0.00–0.07)
Basophils Absolute: 0 10*3/uL (ref 0.0–0.1)
Basophils Relative: 0 %
Eosinophils Absolute: 0.1 10*3/uL (ref 0.0–0.5)
Eosinophils Relative: 1 %
HCT: 26.4 % — ABNORMAL LOW (ref 36.0–46.0)
Hemoglobin: 9.4 g/dL — ABNORMAL LOW (ref 12.0–15.0)
Immature Granulocytes: 1 %
Lymphocytes Relative: 15 %
Lymphs Abs: 1.7 10*3/uL (ref 0.7–4.0)
MCH: 30.1 pg (ref 26.0–34.0)
MCHC: 35.6 g/dL (ref 30.0–36.0)
MCV: 84.6 fL (ref 80.0–100.0)
Monocytes Absolute: 0.5 10*3/uL (ref 0.1–1.0)
Monocytes Relative: 4 %
Neutro Abs: 9.1 10*3/uL — ABNORMAL HIGH (ref 1.7–7.7)
Neutrophils Relative %: 79 %
Platelets: 254 10*3/uL (ref 150–400)
RBC: 3.12 MIL/uL — ABNORMAL LOW (ref 3.87–5.11)
RDW: 13.9 % (ref 11.5–15.5)
WBC: 11.5 10*3/uL — ABNORMAL HIGH (ref 4.0–10.5)
nRBC: 0 % (ref 0.0–0.2)

## 2021-04-20 LAB — URINE CULTURE: Culture: <10000 — AB

## 2021-04-20 LAB — COMPREHENSIVE METABOLIC PANEL
ALT: 20 U/L (ref 0–44)
AST: 23 U/L (ref 15–41)
Albumin: 1.6 g/dL — ABNORMAL LOW (ref 3.5–5.0)
Alkaline Phosphatase: 84 U/L (ref 38–126)
Anion gap: 9 (ref 5–15)
BUN: 50 mg/dL — ABNORMAL HIGH (ref 6–20)
CO2: 22 mmol/L (ref 22–32)
Calcium: 8.6 mg/dL — ABNORMAL LOW (ref 8.9–10.3)
Chloride: 104 mmol/L (ref 98–111)
Creatinine, Ser: 3.5 mg/dL — ABNORMAL HIGH (ref 0.44–1.00)
GFR, Estimated: 17 mL/min — ABNORMAL LOW (ref >60)
Glucose, Bld: 84 mg/dL (ref 70–99)
Potassium: 4 mmol/L (ref 3.5–5.1)
Sodium: 135 mmol/L (ref 135–145)
Total Bilirubin: 0.6 mg/dL (ref 0.3–1.2)
Total Protein: 4.8 g/dL — ABNORMAL LOW (ref 6.5–8.1)

## 2021-04-20 LAB — GLUCOSE, CAPILLARY
Glucose-Capillary: 84 mg/dL (ref 70–99)
Glucose-Capillary: 45 mg/dL — ABNORMAL LOW (ref 70–99)
Glucose-Capillary: 127 mg/dL — ABNORMAL HIGH (ref 70–99)
Glucose-Capillary: 104 mg/dL — ABNORMAL HIGH (ref 70–99)
Glucose-Capillary: 127 mg/dL — ABNORMAL HIGH (ref 70–99)
Glucose-Capillary: 51 mg/dL — ABNORMAL LOW (ref 70–99)
Glucose-Capillary: 98 mg/dL (ref 70–99)

## 2021-04-20 LAB — STREP PNEUMONIAE URINARY ANTIGEN: Strep Pneumo Urinary Antigen: NEGATIVE

## 2021-04-20 LAB — PHOSPHORUS: Phosphorus: 2.9 mg/dL (ref 2.5–4.6)

## 2021-04-20 LAB — RPR: RPR Ser Ql: NONREACTIVE

## 2021-04-20 MED ORDER — ALUM & MAG HYDROXIDE-SIMETH 200-200-20 MG/5ML PO SUSP
30.0000 mL | Freq: Once | ORAL | Status: DC
  Filled 2021-04-20: qty 30

## 2021-04-20 MED ORDER — DEXTROSE 50 % IV SOLN
INTRAVENOUS | Status: AC
  Filled 2021-04-20: qty 50

## 2021-04-20 MED ORDER — PREDNISOLONE ACETATE 1 % OP SUSP
1.0000 [drp] | Freq: Every day | OPHTHALMIC | Status: DC
  Administered 2021-04-21 – 2021-04-22 (×2): 1 [drp] via OPHTHALMIC
  Filled 2021-04-20: qty 5

## 2021-04-20 MED ORDER — CHLORHEXIDINE GLUCONATE 0.12 % MT SOLN
15.0000 mL | Freq: Two times a day (BID) | OROMUCOSAL | Status: DC
  Administered 2021-04-21 – 2021-04-22 (×3): 15 mL via OROMUCOSAL
  Filled 2021-04-20 (×3): qty 15

## 2021-04-20 MED ORDER — DEXTROSE 50 % IV SOLN: 25.0000 g | INTRAVENOUS | Status: AC

## 2021-04-20 MED ORDER — SENNOSIDES-DOCUSATE SODIUM 8.6-50 MG PO TABS
2.0000 | ORAL_TABLET | Freq: Once | ORAL | Status: AC
  Administered 2021-04-20: 2 via ORAL
  Filled 2021-04-20: qty 2

## 2021-04-20 MED ORDER — INSULIN ASPART 100 UNIT/ML IJ SOLN
0.0000 [IU] | Freq: Three times a day (TID) | INTRAMUSCULAR | Status: DC
Start: 2021-04-21 — End: 2021-04-23
  Administered 2021-04-21: 3 [IU] via SUBCUTANEOUS
  Administered 2021-04-21: 2 [IU] via SUBCUTANEOUS
  Administered 2021-04-21 – 2021-04-22 (×2): 1 [IU] via SUBCUTANEOUS

## 2021-04-20 MED ORDER — ALBUMIN HUMAN 25 % IV SOLN
25.0000 g | Freq: Four times a day (QID) | INTRAVENOUS | Status: AC
  Administered 2021-04-20 (×2): 25 g via INTRAVENOUS
  Filled 2021-04-20 (×2): qty 100

## 2021-04-20 MED ORDER — PANTOPRAZOLE SODIUM 40 MG IV SOLR
40.0000 mg | INTRAVENOUS | Status: DC
Start: 2021-04-20 — End: 2021-04-23
  Administered 2021-04-20 – 2021-04-22 (×3): 40 mg via INTRAVENOUS
  Filled 2021-04-20 (×3): qty 40

## 2021-04-20 MED ORDER — POLYETHYLENE GLYCOL 3350 17 G PO PACK
17.0000 g | PACK | Freq: Every day | ORAL | Status: DC
  Filled 2021-04-20 (×2): qty 1

## 2021-04-20 MED ORDER — MAGIC MOUTHWASH W/LIDOCAINE
15.0000 mL | Freq: Four times a day (QID) | ORAL | Status: DC | PRN
  Administered 2021-04-21 – 2021-04-23 (×2): 15 mL via ORAL
  Filled 2021-04-20 (×3): qty 15

## 2021-04-20 MED ORDER — LABETALOL HCL 100 MG PO TABS
100.0000 mg | ORAL_TABLET | Freq: Two times a day (BID) | ORAL | Status: DC
  Administered 2021-04-20 – 2021-04-22 (×5): 100 mg via ORAL
  Filled 2021-04-20 (×7): qty 1

## 2021-04-20 MED ORDER — FUROSEMIDE 10 MG/ML IJ SOLN
60.0000 mg | Freq: Four times a day (QID) | INTRAMUSCULAR | Status: AC
  Administered 2021-04-20: 60 mg via INTRAVENOUS
  Filled 2021-04-20: qty 6

## 2021-04-20 MED ORDER — INSULIN ASPART 100 UNIT/ML IJ SOLN
0.0000 [IU] | Freq: Three times a day (TID) | INTRAMUSCULAR | Status: DC
Start: 2021-04-20 — End: 2021-04-20

## 2021-04-20 MED ORDER — ALBUMIN HUMAN 25 % IV SOLN: 25.0000 g | Freq: Four times a day (QID) | INTRAVENOUS | Status: DC

## 2021-04-20 MED ORDER — MAGNESIUM SULFATE IN D5W 1-5 GM/100ML-% IV SOLN
1.0000 g | Freq: Once | INTRAVENOUS | Status: AC
  Administered 2021-04-20: 1 g via INTRAVENOUS
  Filled 2021-04-20: qty 100

## 2021-04-20 MED ORDER — HYDRALAZINE HCL 25 MG PO TABS
25.0000 mg | ORAL_TABLET | Freq: Four times a day (QID) | ORAL | Status: DC | PRN
  Filled 2021-04-20: qty 1

## 2021-04-20 MED ORDER — HYDRALAZINE HCL 20 MG/ML IJ SOLN
5.0000 mg | Freq: Four times a day (QID) | INTRAMUSCULAR | Status: DC | PRN
  Filled 2021-04-20: qty 1

## 2021-04-20 MED ORDER — FUROSEMIDE 10 MG/ML IJ SOLN: 60.0000 mg | Freq: Four times a day (QID) | INTRAMUSCULAR | Status: DC

## 2021-04-20 MED ORDER — ORAL CARE MOUTH RINSE
15.0000 mL | Freq: Two times a day (BID) | OROMUCOSAL | Status: DC
  Administered 2021-04-20 – 2021-04-21 (×2): 15 mL via OROMUCOSAL

## 2021-04-20 MED ORDER — LIDOCAINE VISCOUS HCL 2 % MT SOLN
15.0000 mL | Freq: Once | OROMUCOSAL | Status: AC
  Administered 2021-04-20: 15 mL via ORAL
  Filled 2021-04-20 (×2): qty 15

## 2021-04-20 MED ORDER — LIP MEDEX EX OINT: TOPICAL_OINTMENT | CUTANEOUS | Status: DC | PRN

## 2021-04-20 MED ORDER — NEOMYCIN-POLYMYXIN-DEXAMETH 0.1 % OP OINT
1.0000 "application " | TOPICAL_OINTMENT | Freq: Every day | OPHTHALMIC | Status: DC
  Administered 2021-04-21 – 2021-04-22 (×2): 1 via OPHTHALMIC

## 2021-04-20 NOTE — Progress Notes (Signed)
Hand-off report received from Brien Mates, RN.  I agree with her assessment of the patient.  Angie Fava, RN

## 2021-04-20 NOTE — Progress Notes (Signed)
PROGRESS NOTE    Ann Robertson  TOI:[id number] DOB: 07/07/1990 DOA: 04/18/2021 PCP: [facility] [place]   Chief Complaint  Patient presents with   Hyperglycemia    Brief Narrative:  Ann Robertson is Ann Robertson 30 y.o. female with medical history significant for type 1 diabetes mellitus, neuropathy secondary to diabetes, noncompliant with diabetes care who presents by EMS from home with altered mental status and elevated blood sugar readings.  According to chart patient was recently admitted for hypokalemia, anemia and hypertension but was discharged Ann Robertson week ago.  Ann Robertson is not able to provide any history secondary to altered mental status.  No family is at bedside.  She mumbles incoherently and does not follow commands.  Four-point restraints in the emergency room.   Assessment & Plan:   Principal Problem:   DKA, type 1 ([place]) Active Problems:   Noncompliance with diabetes treatment   Prolonged QT interval   CKD (chronic kidney disease), stage III (HCC)   Bilateral fixed dilatation of pupils   DKA (diabetic ketoacidosis) (HCC)   Acute renal failure superimposed on stage 3 chronic kidney disease, unspecified acute renal failure type, unspecified whether stage 3a or 3b CKD (HCC)   Hypothermia   Acute metabolic encephalopathy   Hyperkalemia   Anasarca   Lactic acid increased   Anemia   Hyponatremia   Acute urinary retention  Bilateral fixed dilatation of pupils Noted by RN 11/20 AM Encephalopathic, moving all extremities Code stroke called Head CT motion degraded, presumed postoperative changes to L globe MRI brain motion degraded without acute abnormalities  Appreciate neurology recs, recommending glucose control, call back if continued encephalopathy  Of note, s/p PPV 25 GA/EL/MP OS, indirect laser ophthalmoscopy OD 04/14/2021 (see care everywhere note)    DKA (diabetic ketoacidosis) ([place]) Severe acidemia at presentation as well as severe hyperglycemia Now  resolved Hypoglycemic last night Follow on basal, reduce SSI to mealtime and follow  Unclear precipitating factor, w/u for infection, add abx  Hypothermia SIRS criteria with developing leukocytosis, tachycardia, tachypnea, hypothermia at presentation. CXR with mildly increased bibasilar opacities, minimal L pleural effusion Blood cultures pending UA not concerning for UTI Follow urine culture Negative MRSA PCR CT abd/pelvis with anasarca, small volume ascites, small bilateral effusions Given critical illness, treat with zosyn presumptively   Acute renal failure superimposed on stage 3 chronic kidney disease, unspecified acute renal failure type, unspecified whether stage 3a or 3b CKD (HCC) Baseline creatinine unclear, looks like somewhere around 2? Though was lower in 1's earlier in 2022. Creatinine 4.73 at presentation Improved to 3.5 today, anasarca on exam, stop IVF trial of lasix + albumin given hypoalbuminemia Renal US without hydro (limited exam), small debris in bladder Suspect aki at presentation 2/2 severe dehydration with DKA   Hyperkalemia resolved  Acute metabolic encephalopathy Due to acute illness Follow ammonia (wnl), b12 (elevated), folate (pending), rpr (pending), hiv (not reactive), TSH (mildly elevated) Delirium precautions If not resolving, may need to discuss again with neurology  Lactic acid increased Trend, gradually improving  Anasarca Hypoalbuminemic, nephrotic syndrome Follow with lasix/albumin  Acute urinary retention Follow S/p I/O Foley now in place, due to chronic diabetes most likely  Hyponatremia Due to hyperglycemia  Anemia Chronic Follow anemia labs  DVT prophylaxis: heparin Code Status: full Family Communication: none at bedsider Disposition:   Status is: Inpatient  Remains inpatient appropriate because: encephalopathy       Consultants:  neurology  Procedures:  none  Antimicrobials:  Anti-infectives (From  admission, onward)  Start     Dose/Rate Route Frequency Ordered Stop   04/19/21 2200  piperacillin-tazobactam (ZOSYN) IVPB 3.375 g        3.375 g 12.5 mL/hr over 240 Minutes Intravenous Every 12 hours 04/19/21 1014     04/19/21 1015  piperacillin-tazobactam (ZOSYN) IVPB 3.375 g        3.375 g 100 mL/hr over 30 Minutes Intravenous STAT 04/19/21 0953 04/19/21 1055          Subjective: Lethargic, no complaints of pain  Objective: Vitals:   04/20/21 0600 04/20/21 0630 04/20/21 0700 04/20/21 0800  BP: (!) 128/95 (!) 124/92 (!) 163/113 (!) 151/102  Pulse: (!) 110 (!) 109 (!) 110 (!) 111  Resp: 11 11 10 16   Temp:    98.2 F (36.8 C)  TempSrc:    Oral  SpO2: 100% 100% 100% 99%  Weight:      Height:        Intake/Output Summary (Last 24 hours) at 04/20/2021 0945 Last data filed at 04/20/2021 0900 Gross per 24 hour  Intake 3272.68 ml  Output 1800 ml  Net 1472.68 ml   Filed Weights   04/19/21 0100 04/20/21 0500  Weight: 60.9 kg 62.8 kg    Examination:  General exam: Appears calm and comfortable  Respiratory system: Clear to auscultation. Respiratory effort normal. Cardiovascular system: RRR Gastrointestinal system: Abdomen is nondistended, soft and nontender.  Central nervous system: Ann Robertson&Ox2 (knew hospital, couldn't say [facility]), still confused, lethargic - pupils 4-5 mm and non responsive Extremities: diffuse edema to all extremities, anasarca Psychiatry: Judgement and insight appear impaired.    Data Reviewed: I have personally reviewed following labs and imaging studies  CBC: Recent Labs  Lab 04/18/21 1742 04/19/21 0826 04/20/21 0330  WBC 9.4 12.7* 11.5*  NEUTROABS 8.1* 11.0* 9.1*  HGB 8.6* 8.3* 9.4*  HCT 25.9* 23.5* 26.4*  MCV 91.5 85.1 84.6  PLT 378 298 381    Basic Metabolic Panel: Recent Labs  Lab 04/18/21 1742 04/18/21 1951 04/19/21 0520 04/19/21 0826 04/19/21 1240 04/19/21 1832 04/20/21 0330  NA 123*   < > 128* 129* 132* 134* 135  K 7.2*   <  > 4.5 4.5 4.4 4.0 4.0  CL 93*   < > 100 100 104 105 104  CO2 <7*   < > 11* 15* 19* 21* 22  GLUCOSE 1,153*   < > 790* 682* 478* 207* 84  BUN 63*   < > 60* 58* 56* 52* 50*  CREATININE 4.73*   < > 4.22* 4.24* 4.10* 3.62* 3.50*  CALCIUM 8.7*   < > 8.4* 8.5* 8.7* 8.8* 8.6*  MG 2.3  --   --   --   --   --  1.6*  PHOS  --   --   --   --   --   --  2.9   < > = values in this interval not displayed.    GFR: Estimated Creatinine Clearance: 19.9 mL/min (Travon Crochet) (by C-G formula based on SCr of 3.5 mg/dL (H)).  Liver Function Tests: Recent Labs  Lab 04/19/21 1024 04/20/21 0330  AST 25 23  ALT 22 20  ALKPHOS 95 84  BILITOT 0.9 0.6  PROT 5.0* 4.8*  ALBUMIN 1.9* 1.6*    CBG: Recent Labs  Lab 04/19/21 2254 04/19/21 2331 04/20/21 0328 04/20/21 0734 04/20/21 0848  GLUCAP 138* 103* 84 45* 127*     Recent Results (from the past 240 hour(s))  Resp Panel by RT-PCR (Flu Manuelito Poage&B, Covid)  Nasopharyngeal Swab     Status: None   Collection Time: 04/10/21 11:27 PM   Specimen: Nasopharyngeal Swab; Nasopharyngeal(NP) swabs in vial transport medium  Result Value Ref Range Status   SARS Coronavirus 2 by RT PCR NEGATIVE NEGATIVE Final    Comment: (NOTE) SARS-CoV-2 target nucleic acids are NOT DETECTED.  The SARS-CoV-2 RNA is generally detectable in upper respiratory specimens during the acute phase of infection. The lowest concentration of SARS-CoV-2 viral copies this assay can detect is 138 copies/mL. Janifer Gieselman negative result does not preclude SARS-Cov-2 infection and should not be used as the sole basis for treatment or other patient management decisions. Candid Bovey negative result may occur with  improper specimen collection/handling, submission of specimen other than nasopharyngeal swab, presence of viral mutation(s) within the areas targeted by this assay, and inadequate number of viral copies(<138 copies/mL). Brittanny Levenhagen negative result must be combined with clinical observations, patient history, and  epidemiological information. The expected result is Negative.  Fact Sheet for Patients:  EntrepreneurPulse.com.au  Fact Sheet for Healthcare Providers:  IncredibleEmployment.be  This test is no t yet approved or cleared by the [place] FDA and  has been authorized for detection and/or diagnosis of SARS-CoV-2 by FDA under an Emergency Use Authorization (EUA). This EUA will remain  in effect (meaning this test can be used) for the duration of the COVID-19 declaration under Section 564(b)(1) of the Act, 21 U.S.C.section 360bbb-3(b)(1), unless the authorization is terminated  or revoked sooner.       Influenza Dakoda Bassette by PCR NEGATIVE NEGATIVE Final   Influenza B by PCR NEGATIVE NEGATIVE Final    Comment: (NOTE) The Xpert Xpress SARS-CoV-2/FLU/RSV plus assay is intended as an aid in the diagnosis of influenza from Nasopharyngeal swab specimens and should not be used as Korissa Horsford sole basis for treatment. Nasal washings and aspirates are unacceptable for Xpert Xpress SARS-CoV-2/FLU/RSV testing.  Fact Sheet for Patients: EntrepreneurPulse.com.au  Fact Sheet for Healthcare Providers: IncredibleEmployment.be  This test is not yet approved or cleared by the [place] FDA and has been authorized for detection and/or diagnosis of SARS-CoV-2 by FDA under an Emergency Use Authorization (EUA). This EUA will remain in effect (meaning this test can be used) for the duration of the COVID-19 declaration under Section 564(b)(1) of the Act, 21 U.S.C. section 360bbb-3(b)(1), unless the authorization is terminated or revoked.  Performed at [facility], [place] [address] [place], [place] [address]   Resp Panel by RT-PCR (Flu Jakai Onofre&B, Covid) Nasopharyngeal Swab     Status: None   Collection Time: 04/18/21  6:00 PM   Specimen: Nasopharyngeal Swab; Nasopharyngeal(NP) swabs in vial transport medium  Result Value Ref Range Status    SARS Coronavirus 2 by RT PCR NEGATIVE NEGATIVE Final    Comment: (NOTE) SARS-CoV-2 target nucleic acids are NOT DETECTED.  The SARS-CoV-2 RNA is generally detectable in upper respiratory specimens during the acute phase of infection. The lowest concentration of SARS-CoV-2 viral copies this assay can detect is 138 copies/mL. Nuel Dejaynes negative result does not preclude SARS-Cov-2 infection and should not be used as the sole basis for treatment or other patient management decisions. Tricia Pledger negative result may occur with  improper specimen collection/handling, submission of specimen other than nasopharyngeal swab, presence of viral mutation(s) within the areas targeted by this assay, and inadequate number of viral copies(<138 copies/mL). Paulita Licklider negative result must be combined with clinical observations, patient history, and epidemiological information. The expected result is Negative.  Fact Sheet for Patients:  EntrepreneurPulse.com.au  Fact  Sheet for Healthcare Providers:  IncredibleEmployment.be  This test is no t yet approved or cleared by the [place] FDA and  has been authorized for detection and/or diagnosis of SARS-CoV-2 by FDA under an Emergency Use Authorization (EUA). This EUA will remain  in effect (meaning this test can be used) for the duration of the COVID-19 declaration under Section 564(b)(1) of the Act, 21 U.S.C.section 360bbb-3(b)(1), unless the authorization is terminated  or revoked sooner.       Influenza Carmine Carrozza by PCR NEGATIVE NEGATIVE Final   Influenza B by PCR NEGATIVE NEGATIVE Final    Comment: (NOTE) The Xpert Xpress SARS-CoV-2/FLU/RSV plus assay is intended as an aid in the diagnosis of influenza from Nasopharyngeal swab specimens and should not be used as Laylah Riga sole basis for treatment. Nasal washings and aspirates are unacceptable for Xpert Xpress SARS-CoV-2/FLU/RSV testing.  Fact Sheet for  Patients: EntrepreneurPulse.com.au  Fact Sheet for Healthcare Providers: IncredibleEmployment.be  This test is not yet approved or cleared by the [place] FDA and has been authorized for detection and/or diagnosis of SARS-CoV-2 by FDA under an Emergency Use Authorization (EUA). This EUA will remain in effect (meaning this test can be used) for the duration of the COVID-19 declaration under Section 564(b)(1) of the Act, 21 U.S.C. section 360bbb-3(b)(1), unless the authorization is terminated or revoked.  Performed at [facility], [place] [address], [place], [place] [address]   MRSA Next Gen by PCR, Nasal     Status: None   Collection Time: 04/19/21  1:00 AM   Specimen: Nasal Mucosa; Nasal Swab  Result Value Ref Range Status   MRSA by PCR Next Gen NOT DETECTED NOT DETECTED Final    Comment: (NOTE) The GeneXpert MRSA Assay (FDA approved for NASAL specimens only), is one component of Avelardo Reesman comprehensive MRSA colonization surveillance program. It is not intended to diagnose MRSA infection nor to guide or monitor treatment for MRSA infections. Test performance is not FDA approved in patients less than 22 years old. Performed at [facility], [place] [address] [place], [place] [address]          Radiology Studies: CT ABDOMEN PELVIS WO CONTRAST  Result Date: 04/19/2021 CLINICAL DATA:  Abdominal pain, DKA EXAM: CT ABDOMEN AND PELVIS WITHOUT CONTRAST TECHNIQUE: Multidetector CT imaging of the abdomen and pelvis was performed following the standard protocol without IV contrast. Unenhanced CT was performed per clinician order. Lack of IV contrast limits sensitivity and specificity, especially for evaluation of abdominal/pelvic solid viscera. COMPARISON:  02/20/2021 FINDINGS: Lower chest: There are bilateral pleural effusions, left greater than right. Dependent atelectasis is seen within the lower lobes. Hepatobiliary:  No focal liver abnormality is seen. No gallstones, gallbladder wall thickening, or biliary dilatation. Pancreas: Limited unenhanced evaluation the pancreas demonstrates no focal abnormalities. Spleen: Normal in size without focal abnormality. Adrenals/Urinary Tract: No urinary tract calculi or obstructive uropathy. Bladder is decompressed with Yazmina Pareja Foley catheter. The adrenals are unremarkable. Stomach/Bowel: No bowel obstruction or ileus. Normal retrocecal appendix. No bowel wall thickening or inflammatory change. Moderate retained stool within the rectal vault. Vascular/Lymphatic: Mild atherosclerosis throughout the aorta and its branches. No pathologic adenopathy. Reproductive: Uterus and bilateral adnexa are unremarkable. Bilateral tubal ligation clips. Other: Small volume ascites throughout the abdomen and pelvis. There is diffuse mesenteric edema, as well as diffuse body wall edema compatible with anasarca. No free intraperitoneal gas. No abdominal wall hernia. Musculoskeletal: No acute or destructive bony lesions. Reconstructed images demonstrate no additional findings. IMPRESSION: 1.  Diffuse anasarca, with body wall edema, mesenteric edema, and small volume ascites. 2. Small bilateral pleural effusions, left greater than right. 3. Otherwise unremarkable limited unenhanced evaluation of the abdomen and pelvis. Electronically Signed   By: Randa Ngo M.D.   On: 04/19/2021 20:36   CT HEAD WO CONTRAST (5MM)  Result Date: 04/18/2021 CLINICAL DATA:  Altered mental status and hyperglycemia EXAM: CT HEAD WITHOUT CONTRAST TECHNIQUE: Contiguous axial images were obtained from the base of the skull through the vertex without intravenous contrast. COMPARISON:  None. FINDINGS: Brain: No evidence of acute infarction, hemorrhage, hydrocephalus, extra-axial collection or mass lesion/mass effect. Vascular: No hyperdense vessel or unexpected calcification. Skull: Normal. Negative for fracture or focal lesion.  Sinuses/Orbits: No acute finding. Other: None. IMPRESSION: No acute intracranial abnormality noted. Electronically Signed   By: Inez Catalina M.D.   On: 04/18/2021 21:37   MR BRAIN WO CONTRAST  Result Date: 04/19/2021 CLINICAL DATA:  Neuro deficit, ICU patient EXAM: MRI HEAD WITHOUT CONTRAST TECHNIQUE: Multiplanar, multiecho pulse sequences of the brain and surrounding structures were obtained without intravenous contrast. COMPARISON:  Noncontrast CT head obtained earlier the same day FINDINGS: There is intermittent motion degradation, severe on some sequences including the coronal T2 and sagittal T1. Brain: There is no evidence of acute intracranial hemorrhage, extra-axial fluid collection, or acute infarct. Parenchymal signal is normal. Parenchymal volume is normal. The ventricles are not enlarged. There is no mass lesion.  There is no midline shift. Vascular: Normal flow voids. Skull and upper cervical spine: No definite marrow signal abnormality. Sinuses/Orbits: Postprocedural changes are again noted in the left globe. The imaged paranasal sinuses are clear. Other: None. IMPRESSION: No acute intracranial pathology, within the confines of intermittent motion degradation as above. Electronically Signed   By: Valetta Mole M.D.   On: 04/19/2021 17:52   US RENAL  Result Date: 04/19/2021 CLINICAL DATA:  AK I EXAM: RENAL / URINARY TRACT ULTRASOUND COMPLETE COMPARISON:  CT abdomen pelvis 02/20/2021 FINDINGS: Right Kidney: Renal measurements: 9.5 x 4.8 x 5.1 cm = volume: 123 mL. Diffusely increased echogenicity. No mass or hydronephrosis visualized. Left Kidney: Renal measurements: 9.4 x 4.8 x 3.8 cm = volume: 87 mL. Diffusely increased echogenicity. No mass or hydronephrosis visualized. Bladder: Possible small amount of dependent debris. Other: None. IMPRESSION: 1. Limited exam secondary to patient clinical condition. Diffusely increased echogenicity of the bilateral kidneys as can be seen in medical renal  disease. 2. Possible small amount of debris in the bladder. Correlate with urinalysis. Electronically Signed   By: Audie Pinto M.D.   On: 04/19/2021 11:04   DG CHEST PORT 1 VIEW  Result Date: 04/20/2021 CLINICAL DATA:  Confusion, abnormal chest x-ray. EXAM: PORTABLE CHEST 1 VIEW COMPARISON:  April 19, 2021. FINDINGS: The heart size and mediastinal contours are within normal limits. Mildly increased bibasilar opacities are noted concerning for atelectasis or possibly infiltrates. Minimal left pleural effusion may be present. The visualized skeletal structures are unremarkable. IMPRESSION: Mildly increased bibasilar opacities are noted concerning for atelectasis or possibly infiltrates. Minimal left pleural effusion may be present. Electronically Signed   By: Marijo Conception M.D.   On: 04/20/2021 08:39   DG CHEST PORT 1 VIEW  Result Date: 04/19/2021 CLINICAL DATA:  Hypothermia, altered level of consciousness EXAM: PORTABLE CHEST 1 VIEW COMPARISON:  01/26/2021 FINDINGS: Single frontal view of the chest demonstrates an unremarkable cardiac silhouette. Patchy areas of airspace disease are seen within the bilateral lower lobes, left greater than right. No effusion or  pneumothorax. No acute bony abnormalities. IMPRESSION: 1. Patchy bibasilar airspace disease, consistent with multifocal pneumonia. Electronically Signed   By: Randa Ngo M.D.   On: 04/19/2021 16:33   CT HEAD CODE STROKE WO CONTRAST  Addendum Date: 04/19/2021   ADDENDUM REPORT: 04/19/2021 09:21 ADDENDUM: Study discussed by telephone with Dr. Hulen Luster JR on 04/19/2021 at 0905 hours. And he determined the patient had left-side ophthalmic surgery in September. Electronically Signed   By: Genevie Ann M.D.   On: 04/19/2021 09:21   Result Date: 04/19/2021 CLINICAL DATA:  Code stroke.  30 year old female with uneven pupils. EXAM: CT HEAD WITHOUT CONTRAST TECHNIQUE: Contiguous axial images were obtained from the base of the skull through the  vertex without intravenous contrast. COMPARISON:  Head CT without contrast yesterday. FINDINGS: Brain: Intermittent motion artifact. Normal cerebral volume. No midline shift, ventriculomegaly, mass effect, evidence of mass lesion, intracranial hemorrhage or evidence of cortically based acute infarction. Gray-white matter differentiation is within normal limits throughout the brain. Vascular: Mild Calcified atherosclerosis at the skull base. No suspicious intracranial vascular hyperdensity. Skull: Motion artifact at the skull base. No acute osseous abnormality identified. Sinuses/Orbits: Visualized paranasal sinuses and mastoids are stable and well aerated. Other: Stable presumed postoperative changes to the left globe with gas within the vitreous. Negative visible scalp. ASPECTS [facility] Stroke Program Early CT Score) Total score (0-10 with 10 being normal): 10 IMPRESSION: 1. Stable and normal noncontrast CT appearance of the brain when allowing for motion artifact today. ASPECTS 10. 2. Stable presumed recent postoperative changes to the left globe with gas within the vitreous. Electronically Signed: By: Genevie Ann M.D. On: 04/19/2021 09:02        Scheduled Meds:  [START ON 04/21/2021] chlorhexidine  15 mL Mouth Rinse BID   Chlorhexidine Gluconate Cloth  6 each Topical Q0600   furosemide  60 mg Intravenous Q6H   heparin  5,000 Units Subcutaneous Q8H   insulin aspart  0-9 Units Subcutaneous TID WC   insulin glargine-yfgn  5 Units Subcutaneous Daily   mouth rinse  15 mL Mouth Rinse q12n4p   Continuous Infusions:  sodium chloride Stopped (04/19/21 1225)   albumin human     piperacillin-tazobactam (ZOSYN)  IV Stopped (04/20/21 0250)     LOS: 2 days    Time spent: over 30 min    Fayrene Helper, MD [facility]   To contact the attending provider between 7A-7P or the covering provider during after hours 7P-7A, please log into the web site [email or web address] and access using universal [facility] password for that web site. If you do not have the password, please call the hospital operator.  04/20/2021, 9:45 AM

## 2021-04-20 NOTE — Assessment & Plan Note (Signed)
Hypoalbuminemic, nephrotic syndrome Follow with lasix/albumin

## 2021-04-20 NOTE — Assessment & Plan Note (Signed)
Trend, gradually improving

## 2021-04-20 NOTE — Progress Notes (Signed)
Hypoglycemic Event  CBG: 51  Treatment: D50 50 mL (25 gm)  Symptoms: Hungry, Nervous/irritable, and lethargic  Follow-up CBG: Time:1718 CBG Result:127  Possible Reasons for Event: Inadequate meal intake  Comments/MD notified: MD notified    Angie Fava

## 2021-04-20 NOTE — Progress Notes (Signed)
Lab notified this RN at [facility] that they were cancelling lab orders placed by MD due to repeated attempts to collect labs met with refusals from patient.  Nightshift RN expressed understanding that she would let MD know, and that MD would need to place new orders for lab collections.  Angie Fava, RN

## 2021-04-20 NOTE — Plan of Care (Signed)
  Problem: Activity: Goal: Risk for activity intolerance will decrease Outcome: Progressing   Problem: Nutrition: Goal: Adequate nutrition will be maintained Outcome: Progressing   Problem: Pain Managment: Goal: General experience of comfort will improve Outcome: Progressing   Problem: Education: Goal: Knowledge of General Education information will improve Description: Including pain rating scale, medication(s)/side effects and non-pharmacologic comfort measures Outcome: Completed/Met   

## 2021-04-20 NOTE — Progress Notes (Signed)
Jeannette Corpus NP notified of pt Increased b/ps and increasing edema.  Pt fluids decreased to 50 mL /hr per new order.

## 2021-04-21 ENCOUNTER — Inpatient Hospital Stay (HOSPITAL_COMMUNITY): Payer: Medicaid Other

## 2021-04-21 DIAGNOSIS — J029 Acute pharyngitis, unspecified: Secondary | ICD-10-CM

## 2021-04-21 DIAGNOSIS — R079 Chest pain, unspecified: Secondary | ICD-10-CM

## 2021-04-21 LAB — CBC WITH DIFFERENTIAL/PLATELET
Abs Immature Granulocytes: 0.03 10*3/uL (ref 0.00–0.07)
Basophils Absolute: 0 10*3/uL (ref 0.0–0.1)
Basophils Relative: 0 %
Eosinophils Absolute: 0 10*3/uL (ref 0.0–0.5)
Eosinophils Relative: 0 %
HCT: 21.4 % — ABNORMAL LOW (ref 36.0–46.0)
Hemoglobin: 7.4 g/dL — ABNORMAL LOW (ref 12.0–15.0)
Immature Granulocytes: 0 %
Lymphocytes Relative: 14 %
Lymphs Abs: 1 10*3/uL (ref 0.7–4.0)
MCH: 30.1 pg (ref 26.0–34.0)
MCHC: 34.6 g/dL (ref 30.0–36.0)
MCV: 87 fL (ref 80.0–100.0)
Monocytes Absolute: 0.4 10*3/uL (ref 0.1–1.0)
Monocytes Relative: 5 %
Neutro Abs: 5.4 10*3/uL (ref 1.7–7.7)
Neutrophils Relative %: 81 %
Platelets: 205 10*3/uL (ref 150–400)
RBC: 2.46 MIL/uL — ABNORMAL LOW (ref 3.87–5.11)
RDW: 14.5 % (ref 11.5–15.5)
WBC: 6.9 10*3/uL (ref 4.0–10.5)
nRBC: 0 % (ref 0.0–0.2)

## 2021-04-21 LAB — GLUCOSE, CAPILLARY
Glucose-Capillary: 100 mg/dL — ABNORMAL HIGH (ref 70–99)
Glucose-Capillary: 142 mg/dL — ABNORMAL HIGH (ref 70–99)
Glucose-Capillary: 166 mg/dL — ABNORMAL HIGH (ref 70–99)
Glucose-Capillary: 188 mg/dL — ABNORMAL HIGH (ref 70–99)
Glucose-Capillary: 245 mg/dL — ABNORMAL HIGH (ref 70–99)
Glucose-Capillary: 263 mg/dL — ABNORMAL HIGH (ref 70–99)
Glucose-Capillary: 86 mg/dL (ref 70–99)

## 2021-04-21 LAB — COMPREHENSIVE METABOLIC PANEL
ALT: 12 U/L (ref 0–44)
AST: 14 U/L — ABNORMAL LOW (ref 15–41)
Albumin: 2.4 g/dL — ABNORMAL LOW (ref 3.5–5.0)
Alkaline Phosphatase: 58 U/L (ref 38–126)
Anion gap: 9 (ref 5–15)
BUN: 42 mg/dL — ABNORMAL HIGH (ref 6–20)
CO2: 24 mmol/L (ref 22–32)
Calcium: 8.5 mg/dL — ABNORMAL LOW (ref 8.9–10.3)
Chloride: 104 mmol/L (ref 98–111)
Creatinine, Ser: 3.11 mg/dL — ABNORMAL HIGH (ref 0.44–1.00)
GFR, Estimated: 20 mL/min — ABNORMAL LOW (ref >60)
Glucose, Bld: 187 mg/dL — ABNORMAL HIGH (ref 70–99)
Potassium: 4.1 mmol/L (ref 3.5–5.1)
Sodium: 137 mmol/L (ref 135–145)
Total Bilirubin: 1.2 mg/dL (ref 0.3–1.2)
Total Protein: 4.8 g/dL — ABNORMAL LOW (ref 6.5–8.1)

## 2021-04-21 LAB — IRON AND TIBC
Iron: 88 ug/dL (ref 28–170)
Saturation Ratios: 91 % — ABNORMAL HIGH (ref 10.4–31.8)
TIBC: 97 ug/dL — ABNORMAL LOW (ref 250–450)
UIBC: 9 ug/dL

## 2021-04-21 LAB — MAGNESIUM: Magnesium: 1.9 mg/dL (ref 1.7–2.4)

## 2021-04-21 LAB — HEMOGLOBIN AND HEMATOCRIT, BLOOD
HCT: 23.3 % — ABNORMAL LOW (ref 36.0–46.0)
Hemoglobin: 8.1 g/dL — ABNORMAL LOW (ref 12.0–15.0)

## 2021-04-21 LAB — FERRITIN: Ferritin: 477 ng/mL — ABNORMAL HIGH (ref 11–307)

## 2021-04-21 LAB — TROPONIN I (HIGH SENSITIVITY): Troponin I (High Sensitivity): 10 ng/L (ref <18)

## 2021-04-21 LAB — LEGIONELLA PNEUMOPHILA SEROGP 1 UR AG: L. pneumophila Serogp 1 Ur Ag: NEGATIVE

## 2021-04-21 LAB — FOLATE: Folate: 8.4 ng/mL (ref >5.9)

## 2021-04-21 LAB — PHOSPHORUS: Phosphorus: 3.4 mg/dL (ref 2.5–4.6)

## 2021-04-21 MED ORDER — NITROGLYCERIN 0.4 MG SL SUBL
SUBLINGUAL_TABLET | SUBLINGUAL | Status: AC
  Administered 2021-04-21 (×3): 0.4 mg
  Filled 2021-04-21: qty 3

## 2021-04-21 MED ORDER — PIPERACILLIN-TAZOBACTAM 3.375 G IVPB
3.3750 g | Freq: Three times a day (TID) | INTRAVENOUS | Status: DC
  Administered 2021-04-21 – 2021-04-22 (×6): 3.375 g via INTRAVENOUS
  Filled 2021-04-21 (×6): qty 50

## 2021-04-21 MED ORDER — INSULIN ASPART 100 UNIT/ML IJ SOLN
2.0000 [IU] | Freq: Three times a day (TID) | INTRAMUSCULAR | Status: DC
  Administered 2021-04-22: 2 [IU] via SUBCUTANEOUS

## 2021-04-21 MED ORDER — FUROSEMIDE 10 MG/ML IJ SOLN
60.0000 mg | Freq: Two times a day (BID) | INTRAMUSCULAR | Status: DC
  Administered 2021-04-22 (×2): 60 mg via INTRAVENOUS
  Filled 2021-04-21 (×2): qty 6

## 2021-04-21 MED ORDER — ALBUMIN HUMAN 25 % IV SOLN
25.0000 g | Freq: Once | INTRAVENOUS | Status: AC
  Administered 2021-04-21: 25 g via INTRAVENOUS
  Filled 2021-04-21: qty 100

## 2021-04-21 MED ORDER — INSULIN ASPART 100 UNIT/ML IJ SOLN: 3.0000 [IU] | Freq: Three times a day (TID) | INTRAMUSCULAR | Status: DC

## 2021-04-21 MED ORDER — FUROSEMIDE 10 MG/ML IJ SOLN
60.0000 mg | Freq: Four times a day (QID) | INTRAMUSCULAR | Status: AC
  Administered 2021-04-21 (×2): 60 mg via INTRAVENOUS
  Filled 2021-04-21 (×2): qty 6

## 2021-04-21 NOTE — Progress Notes (Addendum)
Discussed foley with Dr. Florene Glen, [place] would like to give bladder rest for one more day and re-evaluate catheter in the am. Pt currently on 60mg  lasix q6hrs.  Also discussed with blood pressure trends with Dr. Florene Glen, md aware no additional interventions ordered at this time.

## 2021-04-21 NOTE — Progress Notes (Signed)
MD on-call notified of pt chest pain and results after taking the sublingual nitroglycerin. MD also notified of pt refusal for labs. RN will continue to closely monitor pt. Delia Heady RN

## 2021-04-21 NOTE — Progress Notes (Signed)
   04/21/21 0045 04/21/21 0102  Vitals  BP (!) 150/104  --   MAP (mmHg) 99  --   BP Location Right Arm  --   BP Method Manual  --   Patient Position (if appropriate) Lying  --   Pulse Rate (!) 117 (!) 106  Pulse Rate Source Monitor  --   Resp 20  --   Level of Consciousness  Level of Consciousness Alert  --   MEWS COLOR  MEWS Score Color Yellow Green  Pain Assessment  Pain Scale 0-10  --   Pain Score 8 (crying d/t pain)  --   Pain Type Acute pain  --   Pain Location Chest  --   Pain Orientation Left  --   Pain Descriptors / Indicators Aching  --   Pain Intervention(s) Medication (See eMAR);Repositioned;Emotional support  --   MEWS Score  MEWS Temp 0 0  MEWS Systolic 0 0  MEWS Pulse 2 1  MEWS RR 0 0  MEWS LOC 0 0  MEWS Score 2 1   RN had given pt her prn fentanyl medication upon request for pain, less than 10 mins, NT notified RN of pt c/o chest pain. Pt assessed to be crying from her pain which located at the chest. Pt assessed to pt sitting up in chair eating at the onset of pain. Standing order nitroglycerin sublingual administered x3 effect. Pt assessed to be sleeping comfortably in bed and she voices pain relieve. RN to continue to monitor pt. Delia Heady RN

## 2021-04-21 NOTE — Assessment & Plan Note (Signed)
Suspect this was precipitating factor for DKA CXR 11/22 with increase in LLL field opacity Continue zosyn Blood cx NGTD x2

## 2021-04-21 NOTE — Assessment & Plan Note (Signed)
Magic mouthwash  

## 2021-04-21 NOTE — Progress Notes (Signed)
Inpatient Diabetes Program Recommendations  AACE/ADA: New Consensus Statement on Inpatient Glycemic Control (2015)  Target Ranges:  Prepandial:   less than 140 mg/dL      Peak postprandial:   less than 180 mg/dL (1-2 hours)      Critically ill patients:  140 - 180 mg/dL    Latest Reference Range & Units 04/20/21 07:34 04/20/21 08:48 04/20/21 11:58 04/20/21 16:52 04/20/21 17:18 04/20/21 19:50  Glucose-Capillary 70 - 99 mg/dL 45 (L) 127 (H) 104 (H)  5 units Semglee _0  51 (L) 127 (H) 98    Latest Reference Range & Units 04/21/21 00:19 04/21/21 03:44 04/21/21 07:39  Glucose-Capillary 70 - 99 mg/dL 100 (H) 188 (H) 245 (H)  2 units Novolog  5 units Semglee _1     Latest Reference Range & Units 06/10/20 09:27 09/10/20 13:35 01/26/21 22:10 04/11/21 06:26  Hemoglobin A1C 4.8 - 5.6 % 11.9 ! >15 12.0 (H) 6.9 (H)    Admit with: DKA   History: Type 1 Diabetes, CKD   Home DM Meds: Dexcom Sensor (NOT using)                              Lantus 2-5 units Daily PRN for high CBG                              Humalog 2-5 units TID                                 Current Orders: Semglee 5 units daily      Novolog 0-6 units TID AC   MD- Please consider:  1. Start Novolog Meal Coverage: Novolog 3 units TID with meals Hold if pt eats <50% of meal, Hold if pt NPO  2. Change PO diet to Carbohydrate Modified Currently ordered  Regular diet with NO carb restrictions     Met w/ pt this AM to discuss markedly improved A1c of 6.9% (was 11.9% Jan 2022, >15% April 2022, 12% Aug 2022).  Pt began to cry when we talked about her improvement.  Pt told me she started seeing ENDO with [facility] in October and she has been trying very hard to get her CBGs better controlled at home.  Pt has insulin at home.  We reviewed her home regimen (see below from last ENDO visit).  Has not been having issues with Hypoglycemia (per pt report).  She did not have questions for me at this time.  I strongly  encouraged her to continue the positive changes she has made at home and to continue to keep all appts with the ENDO.     Just hospitalized from 11/11 thru 11/13 for: Anemia Hypertensive urgency Hypokalemia Acute renal failure superimposed on stage 3 chronic kidney disease     Endocrinologist: Dr. Janese Banks with [facility] Initial consult 03/09/2021 Was instructed to take the following: Diabetes regimen: -Continue long acting insulin at 5 units per day, ideally take same time every day ~9 am -Adjust insulin to carb ratio to 1 unit for every 30 grams carbohydrates -Adjust correction scale to the following when eating and when not eating: If BG 200-250 - take 1 additional unit If BG 251- 300- take 2 additional units If BG 301 -350 - take 3 additional units If BG 351 - 400 - take 4 additional units  If  BG > 400 - take 4 units total    --Will follow patient during hospitalization--  Wyn Quaker RN, MSN, CDE Diabetes Coordinator Inpatient Glycemic Control Team Team Pager: [phone] (8a-5p)

## 2021-04-21 NOTE — Assessment & Plan Note (Signed)
Unclear, occurred last night CXR with increase in LLL field, concerning for increase in L effusion and worsening pneumonia She refused blood draw for troponin, EKG with sinus tach CP 2/2 pneumonia

## 2021-04-21 NOTE — Progress Notes (Signed)
Pharmacy Antibiotic Note  Ann Robertson is a 30 y.o. female admitted on 04/18/2021 with sepsis.  Pharmacy has been consulted for Zosyn dosing. -Renal function improving; CrCl now > 11ml/min  Plan: Increase Zosyn 3.375 g infused over 4 hours every 8 hours Monitor clinical progress, renal function F/U C&S, abx deescalation / LOT   Height: 5\' 4"  (162.6 cm) Weight: 63.5 kg (140 lb) IBW/kg (Calculated) : 54.7  Temp (24hrs), Avg:98.7 F (37.1 C), Min:98 F (36.7 C), Max:99.3 F (37.4 C)  Recent Labs  Lab 04/18/21 1742 04/18/21 1951 04/19/21 0826 04/19/21 1024 04/19/21 1240 04/19/21 1832 04/20/21 0330 04/21/21 0401  WBC 9.4  --  12.7*  --   --   --  11.5* 6.9  CREATININE 4.73*   < > 4.24*  --  4.10* 3.62* 3.50* 3.11*  LATICACIDVEN  --   --   --  3.9* 3.8* 3.2*  --   --    < > = values in this interval not displayed.     Estimated Creatinine Clearance: 22.4 mL/min (A) (by C-G formula based on SCr of 3.11 mg/dL (H)).    Allergies  Allergen Reactions   Hydromorphone Hcl Itching and Rash    Antimicrobials this admission: 11/20 Zosyn >>   Dose adjustments this admission: 11/22: increase q8h for CrCl>20  Microbiology results: 11/20 BCx: NGTD 11/20 MRSA PCR: negative  Thank you for allowing pharmacy to be a part of this patient's care.  Netta Cedars, PharmD, BCPS ([place]) 04/21/2021 5:33 AM

## 2021-04-21 NOTE — TOC Progression Note (Signed)
Transition of Care [facility]) - Progression Note    Patient Details  Name: Ann Robertson MRN: [id number] Date of Birth: Mar 20, 1991  Transition of Care [facility]) CM/SW Contact  Purcell Mouton, RN Phone Number: 04/21/2021, 3:20 PM  Clinical Narrative:    TOC reviewed chart at present time there are no needs.   Expected Discharge Plan: Home/Self Care Barriers to Discharge: No Barriers Identified  Expected Discharge Plan and Services Expected Discharge Plan: Home/Self Care         Expected Discharge Date:  (unknown)                                     Social Determinants of Health (SDOH) Interventions    Readmission Risk Interventions No flowsheet data found.

## 2021-04-21 NOTE — Progress Notes (Addendum)
PROGRESS NOTE    Ann Robertson  UKG:[id number] DOB: 12-11-1990 DOA: 04/18/2021 [facility]CP: [facility] [place]   Chief Complaint  Patient presents with   Hyperglycemia    Brief Narrative:  Ann Robertson is Ann Robertson 30 y.o. female with medical history significant for type 1 diabetes mellitus, neuropathy secondary to diabetes, noncompliant with diabetes care who presents by EMS from home with altered mental status and elevated blood sugar readings.  She was recently discharged after hospitalization with hypokalemia, anemia, and hypertension.  She represented with DKA and what now looks like sepsis due to pneumonia.  She was markedly encephalopathic and noted to have fixed pupils, Elgin Carn code stroke was called and neurology was consulted (findings thought to be due to metabolic abnormalities).  She's gradually improving, now anasarca requiring diuresis.  She remains on antibiotics.  Discharge pending further improvement in edema and symptoms.   Assessment & Plan:   Principal Problem:   DKA, type 1 ([place]) Active Problems:   Noncompliance with diabetes treatment   Prolonged QT interval   CKD (chronic kidney disease), stage III ([place])   DKA (diabetic ketoacidosis) ([place])   Community acquired pneumonia   Sepsis ([place])   Acute renal failure superimposed on stage 3 chronic kidney disease, unspecified acute renal failure type, unspecified whether stage 3a or 3b CKD (HCC)   Bilateral fixed dilatation of pupils   Anemia   Acute metabolic encephalopathy   Hyperkalemia   Chest pain   Anasarca   Lactic acid increased   Sore throat   Hyponatremia   Acute urinary retention  DKA (diabetic ketoacidosis) (HCC) Severe acidemia at presentation as well as severe hyperglycemia Now resolved Hypoglycemic last night Follow on basal, SSI, mealtime She wants regular diet as this is what she eats at home, will continue for now Unclear precipitating factor, w/u for infection, add abx  Sepsis ([place]) SIRS criteria  with developing leukocytosis, tachycardia, tachypnea, hypothermia at presentation.  Infection appears to have been pneumonia. CXR 11/22 with findings concerning for pneumonia Blood cultures NGTD UA not concerning for UTI Follow urine culture insignificant growth Negative MRSA PCR CT abd/pelvis with anasarca, small volume ascites, small bilateral effusions Given critical illness, treat with zosyn presumptively, continue for now   Community acquired pneumonia Suspect this was precipitating factor for DKA CXR 11/22 with increase in LLL field opacity Continue zosyn Blood cx NGTD x2    Bilateral fixed dilatation of pupils Noted by RN 11/20 AM Encephalopathic, moving all extremities Code stroke called Head CT motion degraded, presumed postoperative changes to L globe MRI brain motion degraded without acute abnormalities  Appreciate neurology recs, recommending glucose control, call back if continued encephalopathy  Of note, s/p PPV 25 GA/EL/MP OS, indirect laser ophthalmoscopy OD 04/14/2021 (see care everywhere note)    Acute renal failure superimposed on stage 3 chronic kidney disease, unspecified acute renal failure type, unspecified whether stage 3a or 3b CKD ([place]) Baseline creatinine unclear, looks like somewhere around 2? Though was lower in 1's earlier in 2022. Creatinine 4.73 at presentation Improved to 3.1 today, anasarca on exam Continue lasix + albumin Renal US without hydro (limited exam), small debris in bladder Suspect aki at presentation 2/2 severe dehydration with DKA   Chest pain Unclear, occurred last night CXR with increase in LLL field, concerning for increase in L effusion and worsening pneumonia She refused blood draw for troponin, EKG with sinus tach CP 2/2 pneumonia   Hyperkalemia resolved  Acute metabolic encephalopathy Due to acute illness Follow ammonia (wnl), b12 (  elevated), folate (wnl), rpr (non reactive), hiv (not reactive), TSH (mildly  elevated) Delirium precautions She's improving If not resolving, may need to discuss again with neurology  Anemia Chronic Follow anemia labs - AOCD downtrending Will repeat  Lactic acid increased Trend, gradually improving  Anasarca Hypoalbuminemic, nephrotic syndrome Follow with lasix/albumin  Sore throat Magic mouth wash  Acute urinary retention Follow S/p I/O Foley now in place, due to chronic diabetes most likely Consider removing foley within next day or so  Hyponatremia Due to hyperglycemia  DVT prophylaxis: heparin Code Status: full Family Communication: none at bedsider Disposition:   Status is: Inpatient  Remains inpatient appropriate because: encephalopathy       Consultants:  neurology  Procedures:  none  Antimicrobials:  Anti-infectives (From admission, onward)    Start     Dose/Rate Route Frequency Ordered Stop   04/21/21 0600  piperacillin-tazobactam (ZOSYN) IVPB 3.375 g        3.375 g 12.5 mL/hr over 240 Minutes Intravenous Every 8 hours 04/21/21 0536     04/19/21 2200  piperacillin-tazobactam (ZOSYN) IVPB 3.375 g  Status:  Discontinued        3.375 g 12.5 mL/hr over 240 Minutes Intravenous Every 12 hours 04/19/21 1014 04/21/21 0536   04/19/21 1015  piperacillin-tazobactam (ZOSYN) IVPB 3.375 g        3.375 g 100 mL/hr over 30 Minutes Intravenous STAT 04/19/21 0953 04/19/21 1055          Subjective: Chest pain last night  Objective: Vitals:   04/21/21 0347 04/21/21 0422 04/21/21 1007 04/21/21 1416  BP: (!) 157/102  (!) 162/104 (!) 168/105  Pulse: (!) 108  (!) 109 (!) 109  Resp: 18  14 12   Temp: 99.2 F (37.3 C)   99.1 F (37.3 C)  TempSrc: Oral   Oral  SpO2: 100%   99%  Weight:  63.5 kg    Height:        Intake/Output Summary (Last 24 hours) at 04/21/2021 1943 Last data filed at 04/21/2021 1419 Gross per 24 hour  Intake 1045.3 ml  Output 2301 ml  Net -1255.7 ml   Filed Weights   04/19/21 0100 04/20/21 0500  04/21/21 0422  Weight: 60.9 kg 62.8 kg 63.5 kg    Examination:  General: No acute distress. Cardiovascular: RRR Lungs: unlabored Abdomen: Soft, nontender, nondistended Neurological: fixed pupils, generalized weakness, moving all extremities Skin: Warm and dry. No rashes or lesions. Extremities: anasarca   Data Reviewed: I have personally reviewed following labs and imaging studies  CBC: Recent Labs  Lab 04/18/21 1742 04/19/21 0826 04/20/21 0330 04/21/21 0401  WBC 9.4 12.7* 11.5* 6.9  NEUTROABS 8.1* 11.0* 9.1* 5.4  HGB 8.6* 8.3* 9.4* 7.4*  HCT 25.9* 23.5* 26.4* 21.4*  MCV 91.5 85.1 84.6 87.0  PLT 378 298 254 599    Basic Metabolic Panel: Recent Labs  Lab 04/18/21 1742 04/18/21 1951 04/19/21 0826 04/19/21 1240 04/19/21 1832 04/20/21 0330 04/21/21 0401  NA 123*   < > 129* 132* 134* 135 137  K 7.2*   < > 4.5 4.4 4.0 4.0 4.1  CL 93*   < > 100 104 105 104 104  CO2 <7*   < > 15* 19* 21* 22 24  GLUCOSE 1,153*   < > 682* 478* 207* 84 187*  BUN 63*   < > 58* 56* 52* 50* 42*  CREATININE 4.73*   < > 4.24* 4.10* 3.62* 3.50* 3.11*  CALCIUM 8.7*   < > 8.5*  8.7* 8.8* 8.6* 8.5*  MG 2.3  --   --   --   --  1.6* 1.9  PHOS  --   --   --   --   --  2.9 3.4   < > = values in this interval not displayed.    GFR: Estimated Creatinine Clearance: 22.4 mL/min (Nohelia Valenza) (by C-G formula based on SCr of 3.11 mg/dL (H)).  Liver Function Tests: Recent Labs  Lab 04/19/21 1024 04/20/21 0330 04/21/21 0401  AST 25 23 14*  ALT 22 20 12   ALKPHOS 95 84 58  BILITOT 0.9 0.6 1.2  PROT 5.0* 4.8* 4.8*  ALBUMIN 1.9* 1.6* 2.4*    CBG: Recent Labs  Lab 04/21/21 0019 04/21/21 0344 04/21/21 0739 04/21/21 1225 04/21/21 1734  GLUCAP 100* 188* 245* 263* 166*     Recent Results (from the past 240 hour(s))  Resp Panel by RT-PCR (Flu Coty Student&B, Covid) Nasopharyngeal Swab     Status: None   Collection Time: 04/18/21  6:00 PM   Specimen: Nasopharyngeal Swab; Nasopharyngeal(NP) swabs in vial  transport medium  Result Value Ref Range Status   SARS Coronavirus 2 by RT PCR NEGATIVE NEGATIVE Final    Comment: (NOTE) SARS-CoV-2 target nucleic acids are NOT DETECTED.  The SARS-CoV-2 RNA is generally detectable in upper respiratory specimens during the acute phase of infection. The lowest concentration of SARS-CoV-2 viral copies this assay can detect is 138 copies/mL. Siddarth Hsiung negative result does not preclude SARS-Cov-2 infection and should not be used as the sole basis for treatment or other patient management decisions. Phillis Thackeray negative result may occur with  improper specimen collection/handling, submission of specimen other than nasopharyngeal swab, presence of viral mutation(s) within the areas targeted by this assay, and inadequate number of viral copies(<138 copies/mL). Jaysion Ramseyer negative result must be combined with clinical observations, patient history, and epidemiological information. The expected result is Negative.  Fact Sheet for Patients:  EntrepreneurPulse.com.au  Fact Sheet for Healthcare Providers:  IncredibleEmployment.be  This test is no t yet approved or cleared by the [place] FDA and  has been authorized for detection and/or diagnosis of SARS-CoV-2 by FDA under an Emergency Use Authorization (EUA). This EUA will remain  in effect (meaning this test can be used) for the duration of the COVID-19 declaration under Section 564(b)(1) of the Act, 21 U.S.C.section 360bbb-3(b)(1), unless the authorization is terminated  or revoked sooner.       Influenza Imane Burrough by PCR NEGATIVE NEGATIVE Final   Influenza B by PCR NEGATIVE NEGATIVE Final    Comment: (NOTE) The Xpert Xpress SARS-CoV-2/FLU/RSV plus assay is intended as an aid in the diagnosis of influenza from Nasopharyngeal swab specimens and should not be used as Joren Rehm sole basis for treatment. Nasal washings and aspirates are unacceptable for Xpert Xpress SARS-CoV-2/FLU/RSV testing.  Fact  Sheet for Patients: EntrepreneurPulse.com.au  Fact Sheet for Healthcare Providers: IncredibleEmployment.be  This test is not yet approved or cleared by the [place] FDA and has been authorized for detection and/or diagnosis of SARS-CoV-2 by FDA under an Emergency Use Authorization (EUA). This EUA will remain in effect (meaning this test can be used) for the duration of the COVID-19 declaration under Section 564(b)(1) of the Act, 21 U.S.C. section 360bbb-3(b)(1), unless the authorization is terminated or revoked.  Performed at [facility], [place] [address], [place], [place] [address]   MRSA Next Gen by PCR, Nasal     Status: None   Collection Time: 04/19/21  1:00 AM  Specimen: Nasal Mucosa; Nasal Swab  Result Value Ref Range Status   MRSA by PCR Next Gen NOT DETECTED NOT DETECTED Final    Comment: (NOTE) The GeneXpert MRSA Assay (FDA approved for NASAL specimens only), is one component of Carinna Newhart comprehensive MRSA colonization surveillance program. It is not intended to diagnose MRSA infection nor to guide or monitor treatment for MRSA infections. Test performance is not FDA approved in patients less than 35 years old. Performed at [facility], [place] [address] [place], [place] [address]   Urine Culture     Status: Abnormal   Collection Time: 04/19/21  6:00 AM   Specimen: Urine, Random  Result Value Ref Range Status   Specimen Description   Final    URINE, RANDOM Performed at [facility] [address], [place], [place] [address]    Special Requests   Final    NONE Performed at [facility], [place] [address] [place], [place] [address]    Culture (Shadana Pry)  Final    <10,000 COLONIES/mL INSIGNIFICANT GROWTH Performed at [facility] [address] [place], [place] [address]    Report Status 04/20/2021 FINAL  Final  Culture, blood (routine x 2)     Status: None  (Preliminary result)   Collection Time: 04/19/21 10:24 AM   Specimen: BLOOD  Result Value Ref Range Status   Specimen Description   Final    BLOOD BLOOD RIGHT HAND Performed at [facility] [address] [place], [place] [address]    Special Requests   Final    BOTTLES DRAWN AEROBIC ONLY Blood Culture adequate volume Performed at [facility] [address] [place], [place] [address]    Culture   Final    NO GROWTH 2 DAYS Performed at [facility] [address] [place], [place] [address]    Report Status PENDING  Incomplete  Culture, blood (routine x 2)     Status: None (Preliminary result)   Collection Time: 04/19/21 10:24 AM   Specimen: BLOOD  Result Value Ref Range Status   Specimen Description   Final    BLOOD BLOOD RIGHT WRIST Performed at [facility] [address], [place], [place] [address]    Special Requests   Final    BOTTLES DRAWN AEROBIC ONLY Blood Culture adequate volume Performed at [facility] [address], [place], [place] [address]    Culture   Final    NO GROWTH 2 DAYS Performed at [facility] [address] [place],  [address]    Report Status PENDING  Incomplete         Radiology Studies: DG CHEST PORT 1 VIEW  Result Date: 04/21/2021 CLINICAL DATA:  Chest pain EXAM: PORTABLE CHEST 1 VIEW COMPARISON:  04/20/2021 FINDINGS: Cardiac size is within normal limits. There is increased density in the left lower lung fields. Small transverse linear density is seen in the lateral right lower lung fields. Rest of the lung fields are unremarkable. There are no signs of pulmonary edema. There is blunting of left lateral CP angle. There is no pneumothorax. IMPRESSION: There is interval increase in the opacity in the left lower lung fields. This suggests possible increase in the left pleural effusion and worsening of pneumonia. Small subsegmental atelectasis  is seen in the right lower lung fields. Electronically Signed   By: Elmer Picker M.D.   On: 04/21/2021 13:01  DG CHEST PORT 1 VIEW  Result Date: 04/20/2021 CLINICAL DATA:  Confusion, abnormal chest x-ray. EXAM: PORTABLE CHEST 1 VIEW COMPARISON:  April 19, 2021. FINDINGS: The heart size and mediastinal contours are within normal limits. Mildly increased bibasilar opacities are noted concerning for atelectasis or possibly infiltrates. Minimal left pleural effusion may be present. The visualized skeletal structures are unremarkable. IMPRESSION: Mildly increased bibasilar opacities are noted concerning for atelectasis or possibly infiltrates. Minimal left pleural effusion may be present. Electronically Signed   By: Marijo Conception M.D.   On: 04/20/2021 08:39        Scheduled Meds:  alum & mag hydroxide-simeth  30 mL Oral Once   chlorhexidine  15 mL Mouth Rinse BID   Chlorhexidine Gluconate Cloth  6 each Topical Q0600   heparin  5,000 Units Subcutaneous Q8H   insulin aspart  0-6 Units Subcutaneous TID WC   [START ON 04/22/2021] insulin aspart  2 Units Subcutaneous TID WC   insulin glargine-yfgn  5 Units Subcutaneous Daily   labetalol  100 mg Oral BID   mouth rinse  15 mL Mouth Rinse q12n4p   neomycin-polymyxin-dexameth  1 application Left Eye Daily   pantoprazole (PROTONIX) IV  40 mg Intravenous Q24H   polyethylene glycol  17 g Oral Daily   prednisoLONE acetate  1 drop Both Eyes Daily   Continuous Infusions:  sodium chloride Stopped (04/19/21 1225)   piperacillin-tazobactam (ZOSYN)  IV 3.375 g (04/21/21 1405)     LOS: 3 days    Time spent: over 30 min    Fayrene Helper, MD [facility]   To contact the attending provider between 7A-7P or the covering provider during after hours 7P-7A, please log into the web site [email or web address] and access using universal [place] password for that web site. If you do not have the password, please call the hospital  operator.  04/21/2021, 7:43 PM

## 2021-04-21 NOTE — Evaluation (Signed)
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   Neuropathy    Preeclampsia    STD (sexually transmitted disease)    Past Surgical History:  Past Surgical History:  Procedure Laterality Date   CESAREAN SECTION     CESAREAN SECTION N/A 07/05/2013   Procedure: CESAREAN SECTION;  Surgeon: Emily Filbert, MD;  Location: [facility];  Service: Obstetrics;  Laterality: N/A;   CESAREAN SECTION N/A 07/25/2014   Procedure: CESAREAN SECTION;  Surgeon: Donnamae Jude, MD;  Location: [facility];  Service: Obstetrics;  Laterality: N/A;   CESAREAN SECTION N/A 11/21/2015   Procedure: CESAREAN SECTION;  Surgeon: Donnamae Jude, MD;  Location: [facility];  Service: Obstetrics;  Laterality: N/A;   WISDOM TOOTH EXTRACTION     HPI:  30 yo female adm to [facility] with DKA.  Pt with h/o DM1, neuropathy, renal failure - required bearhugger and 4 point restraint upon admit.  Pt now on RA and AxO x4.  Swallow eval ordered. Pt has h/o orbital surgery and underwent gastric empyting study for nausea.  She reports she takes a PPI at home for GERD and confirms dysphagia to large pills as well as discomfort (pointing to right lateral submandibular area).  Vomiting confirmed by pt - worse if she is lying down eating per pt.  Otherwise pt does not report other exacerbating factors.    Assessment / Plan / Recommendation  Clinical Impression  Pt presents with functional oropharyngeal swallow ability. No indication of dysphagia or aspiration with any po intake.  Pt with negative CN exam -  and easily passed 3 ounce Yale water challenge.  Mastication adequate withour retention.  Pt was able  to self feed graham cracker and hold her own cup drinking via straw.  Pt does appear with mild erythema on posterior soft palate.   Recommend continue regular/thin diet - defer to MD for dietary restrictions given her DM1 - diabetic coordinator arrived for pt education.   Advised pt take large pills that may be problematic with applesauce or foods as needed.  Follow up with GI as an OP as indicated. When MD deems appropriate, recommend PT/OT consult. SLP Visit Diagnosis: Dysphagia, unspecified (R13.10)    Aspiration Risk  No limitations    Diet Recommendation Regular;Thin liquid   Liquid Administration via: Cup;Straw Medication Administration: Whole meds with liquid Supervision: Patient able to self feed (finger foods) Compensations: Slow rate;Small sips/bites Postural Changes: Seated upright at 90 degrees;Remain upright for at least 30 minutes after po intake    Other  Recommendations Oral Care Recommendations: Oral care BID    Recommendations for follow up therapy are one component of a multi-disciplinary discharge planning process, led by the attending physician.  Recommendations may be updated based on patient status, additional functional criteria and insurance authorization.  Follow up Recommendations No SLP follow up      Assistance Recommended at Discharge None  Functional Status Assessment Patient has not had a recent decline in their functional status  Frequency and Duration     N/A       Prognosis   N/A     Swallow Study   General  Date of Onset: 04/21/21 HPI: 30 yo female adm to [facility] with DKA.  Pt with h/o DM1, neuropathy, renal failure - required bearhugger and 4 point restraint upon admit.  Pt now on RA and AxO x4.  Swallow eval ordered. Pt has h/o orbital surgery and underwent gastric empyting study for nausea.  She reports she takes a PPI at home for GERD and confirms dysphagia to large pills as well as discomfort (pointing to right lateral submandibular area).   Vomiting confirmed by pt - worse if she is lying down eating per pt.  Otherwise pt does not report other exacerbating factors. Type of Study: Bedside Swallow Evaluation Diet Prior to this Study: Regular;Thin liquids Temperature Spikes Noted: No Respiratory Status: Room air History of Recent Intubation: No Behavior/Cognition: Alert;Cooperative;Pleasant mood Oral Cavity Assessment: Within Functional Limits Oral Care Completed by SLP: No Oral Cavity - Dentition: Adequate natural dentition Vision: Functional for self-feeding Self-Feeding Abilities: Able to feed self Patient Positioning: Upright in bed Baseline Vocal Quality: Normal Volitional Cough: Strong Volitional Swallow: Able to elicit    Oral/Motor/Sensory Function Overall Oral Motor/Sensory Function: Within functional limits   Ice Chips Ice chips: Not tested   Thin Liquid Thin Liquid: Within functional limits Presentation: Straw    Nectar Thick Nectar Thick Liquid: Not tested   Honey Thick Honey Thick Liquid: Not tested   Puree Puree: Within functional limits Presentation: Self Fed;Spoon   Solid     Solid: Within functional limits      Macario Golds 04/21/2021,11:07 AM  Kathleen Lime, MS [facility] SLP Acute Rehab Services Office [phone] Pager [phone]

## 2021-04-22 ENCOUNTER — Inpatient Hospital Stay (HOSPITAL_COMMUNITY): Payer: Medicaid Other

## 2021-04-22 LAB — GLUCOSE, CAPILLARY
Glucose-Capillary: 48 mg/dL — ABNORMAL LOW (ref 70–99)
Glucose-Capillary: 66 mg/dL — ABNORMAL LOW (ref 70–99)
Glucose-Capillary: 97 mg/dL (ref 70–99)
Glucose-Capillary: 150 mg/dL — ABNORMAL HIGH (ref 70–99)
Glucose-Capillary: 159 mg/dL — ABNORMAL HIGH (ref 70–99)
Glucose-Capillary: 163 mg/dL — ABNORMAL HIGH (ref 70–99)
Glucose-Capillary: 78 mg/dL (ref 70–99)
Glucose-Capillary: 68 mg/dL — ABNORMAL LOW (ref 70–99)
Glucose-Capillary: 79 mg/dL (ref 70–99)
Glucose-Capillary: 110 mg/dL — ABNORMAL HIGH (ref 70–99)

## 2021-04-22 MED ORDER — HYDRALAZINE HCL 10 MG PO TABS
10.0000 mg | ORAL_TABLET | Freq: Three times a day (TID) | ORAL | Status: DC
  Administered 2021-04-22: 10 mg via ORAL
  Filled 2021-04-22: qty 1

## 2021-04-22 MED ORDER — ALBUMIN HUMAN 25 % IV SOLN: 25.0000 g | Freq: Two times a day (BID) | INTRAVENOUS | Status: DC

## 2021-04-22 MED ORDER — FUROSEMIDE 10 MG/ML IJ SOLN: 60.0000 mg | Freq: Two times a day (BID) | INTRAMUSCULAR | Status: DC

## 2021-04-22 MED ORDER — INSULIN GLARGINE-YFGN 100 UNIT/ML ~~LOC~~ SOLN
3.0000 [IU] | Freq: Every day | SUBCUTANEOUS | Status: DC
  Administered 2021-04-22: 3 [IU] via SUBCUTANEOUS
  Filled 2021-04-22: qty 0.03

## 2021-04-22 MED ORDER — LABETALOL HCL 200 MG PO TABS
200.0000 mg | ORAL_TABLET | Freq: Two times a day (BID) | ORAL | Status: DC
  Administered 2021-04-22: 200 mg via ORAL
  Filled 2021-04-22: qty 1

## 2021-04-22 MED ORDER — GLUCERNA SHAKE PO LIQD
237.0000 mL | Freq: Three times a day (TID) | ORAL | Status: DC
  Administered 2021-04-22: 237 mL via ORAL
  Filled 2021-04-22 (×2): qty 237

## 2021-04-22 MED ORDER — FENTANYL CITRATE PF 50 MCG/ML IJ SOSY
12.5000 ug | PREFILLED_SYRINGE | INTRAMUSCULAR | Status: DC | PRN
  Administered 2021-04-22 – 2021-04-23 (×5): 12.5 ug via INTRAVENOUS
  Filled 2021-04-22 (×5): qty 1

## 2021-04-22 MED ORDER — BACITRACIN-NEOMYCIN-POLYMYXIN OINTMENT TUBE
TOPICAL_OINTMENT | Freq: Two times a day (BID) | CUTANEOUS | Status: DC
  Filled 2021-04-22: qty 14.17

## 2021-04-22 MED ORDER — ALBUMIN HUMAN 25 % IV SOLN
25.0000 g | Freq: Two times a day (BID) | INTRAVENOUS | Status: DC
  Filled 2021-04-22: qty 100

## 2021-04-22 MED ORDER — ISOSORBIDE MONONITRATE ER 30 MG PO TB24: 15.0000 mg | ORAL_TABLET | Freq: Every day | ORAL | Status: DC

## 2021-04-22 MED ORDER — SACCHAROMYCES BOULARDII 250 MG PO CAPS
250.0000 mg | ORAL_CAPSULE | Freq: Two times a day (BID) | ORAL | Status: DC
  Administered 2021-04-22 (×2): 250 mg via ORAL
  Filled 2021-04-22 (×2): qty 1

## 2021-04-22 MED ORDER — GABAPENTIN 100 MG PO CAPS
100.0000 mg | ORAL_CAPSULE | Freq: Every day | ORAL | Status: DC
  Administered 2021-04-22: 100 mg via ORAL
  Filled 2021-04-22: qty 1

## 2021-04-22 MED ORDER — LOPERAMIDE HCL 2 MG PO CAPS
2.0000 mg | ORAL_CAPSULE | Freq: Four times a day (QID) | ORAL | Status: DC | PRN
  Administered 2021-04-22 – 2021-04-23 (×2): 2 mg via ORAL
  Filled 2021-04-22 (×2): qty 1

## 2021-04-22 NOTE — Progress Notes (Signed)
Hypoglycemic Event  CBG: 68  Treatment: 8 oz juice/soda  Symptoms: Pale and lethargic  Follow-up CBG: Time:1700 CBG Result:79  Possible Reasons for Event: Inadequate meal intake  Comments/MD notified:MD notified    Angie Fava

## 2021-04-22 NOTE — Discharge Summary (Signed)
Discharge Summary  Ann Robertson OZD:[id number] DOB: [birth year]  PCP: [facility]  Admit date: 04/18/2021 Discharge date: 04/22/2021  Time spent: 40mins, more than 50% time spent on coordination of care.   Patient is transferred to [facility] for ophthalmology service      Discharge Diagnoses:  Active Hospital Problems   Diagnosis Date Noted   DKA, type 1 ([place]) 04/18/2021   Chest pain 04/21/2021   Sore throat 04/21/2021   Lactic acid increased 04/20/2021   Bilateral fixed dilatation of pupils 04/19/2021   CKD (chronic kidney disease), stage III ([place]) 04/19/2021   Hyponatremia 04/19/2021   Acute urinary retention 25/95/6387   Acute metabolic encephalopathy 56/43/3295   Hyperkalemia 04/18/2021   Sepsis ([place]) 04/18/2021   Prolonged QT interval 04/11/2021   Acute renal failure superimposed on stage 3 chronic kidney disease, unspecified acute renal failure type, unspecified whether stage 3a or 3b CKD ([place]) 04/10/2021   Anasarca 02/15/2021   DKA (diabetic ketoacidosis) ([place]) 01/26/2021   Anemia 01/26/2021   Noncompliance with diabetes treatment 11/15/2015   Community acquired pneumonia 05/19/2013    Resolved Hospital Problems  No resolved problems to display.    Discharge Condition: stable  Diet recommendation: heart healthy/carb modified  Filed Weights   04/20/21 0500 04/21/21 0422 04/22/21 0432  Weight: 62.8 kg 63.5 kg 59.1 kg    History of present illness: ( admitting MD Dr Tonie Griffith) Chief Complaint: Altered mental status, elevated blood sugar readings   HPI: Ann Robertson is a 30 y.o. female with medical history significant for type 1 diabetes mellitus, neuropathy secondary to diabetes, noncompliant with diabetes care who presents by EMS from home with altered mental status and elevated blood sugar readings.  According to chart patient was recently admitted for hypokalemia, anemia and hypertension but was discharged a week  ago.  Ann Robertson is not able to provide any history secondary to altered mental status.  No family is at bedside.  She mumbles incoherently and does not follow commands.  Four-point restraints in the emergency room.    ED Course: Ann Robertson has had tachycardia and mild hypothermia with temperature of 94 degrees in the emergency room.  She been placed on a Retail banker.  She is been placed on DKA protocol with insulin infusion.  She is found to have hyperkalemia of potassium 7.2.  VBG shows pH 7.088 PCO2 12.8 PO2 53.5 bicarb 3.7.  Sodium 123 but is corrected to 141 corrected for hyperglycemia.  Potassium 7.2 chloride 93 bicarb less than 7 glucose 1153 creatinine 4.73 from baseline of 2.5-3 BUN 63 calcium 8.7.  WBC 9400 hemoglobin 8.6 hematocrit 25.9 platelets 378,000, Droxia butyrate 15.98, beta hCG 5.5.  Initially the ER physician called PCCM for admission to ICU and the patient with DKA with altered mental status.  PCCM declined the admission.  Hospitalist service was asked to admit for further management.  As DKA with encephalopathy higher risk condition if she does not turn around quickly will consult PCCM for their input.  Hospital Course:  Principal Problem:   DKA, type 1 ([place]) Active Problems:   Community acquired pneumonia   Noncompliance with diabetes treatment   DKA (diabetic ketoacidosis) ([place])   Anemia   Anasarca   Acute renal failure superimposed on stage 3 chronic kidney disease, unspecified acute renal failure type, unspecified whether stage 3a or 3b CKD ([facility])   Prolonged QT interval   Acute metabolic encephalopathy   Hyperkalemia   Sepsis ([place])   Bilateral fixed  dilatation of pupils   CKD (chronic kidney disease), stage III ([facility])   Hyponatremia   Acute urinary retention   Lactic acid increased   Chest pain   Sore throat   DKA/type 1 diabetes/acute metabolic encephalopathy/pseudohyponatremia Presented with severe hypoglycemia and altered mental status DKA resolved now has  hypoglycemia Report her diabetes doctor is Dr. Ilona Sorrel Patient will benefit from getting CGM Appreciate inpatient diabetes coordinator input   Hypoglycemia Likely from poor oral intake, she had a normal gastric emptying study in January 22 On hypoglycemia protocol,  Decrease semglee to 3units qhs Start glucerna , will ask dietitian to see   Per previous attending patient has sepsis presents on admission likely from Pna Urine strep pneumo antigen negative.  Urine Legionella antigen negative Cannot rule out aspiration pneumonia when she was altered She did well with speech therapy on 11/22 Appeared responded to Zosyn well, leukocytosis resolved, will continue Zosyn for total 5 days treatment   Anasarca/proteinuria, likely from diabetic nephropathy Still have pedal edema Continue iv lasix/albumin Plan to discontinue Norvasc due to pedal edema   AKI on CKDIV, anemia of chronic disease, hemoglobin appears to be stable at 8 Cr on admission was 4.73, today is 3.1 at baseline Renal dosing meds   Acute urinary retention Received in and out cath and then Foley insertion Mental status improved, successful voiding trial today   Hypertension Uncontrolled Increase labetalol, start low-dose hydralazine/Imdur Currently on IV Lasix, on torsemide at home plan to resume at discharge On Vasotec at home, plan to resume at discharge   Chronic pain, likely has diabetic neuropathy  Appear to be on Percocet at home, will try to avoid oxycodone in the setting of renal impairment She reports allergy to dilaudid Has been on iv fentanyl, will reduced dose due to drowsiness Will start low dose neurontin at night     Sore throat Magic mouthwash Seen by speech, passed swallow eval   nonReactive pupils Noticed by RN on 11/20 a.m., code stroke called, CT head MRI no acute findings Of note, s/p PPV 25 GA/EL/MP OS, indirect laser ophthalmoscopy OD 04/14/2021 (see care everywhere note)  Patient states  she is seeing floaters, and can not see as well as she wants, I talked to [facility] on call who recommend to reach to her primary ophthalmology group for recommendation, I talked to [facility] on call ophthalmologist resident who recommend patient to be transfer to [facility] for ophthalmology consult. I talked to [facility] transfer line, patient is accepted to transfer, patient is made aware of the plan.  Nutritional Assessment: The patient's BMI is: Body mass index is 22.36 kg/m.Marland Kitchen    Procedures: none  Consultations: Tele neurology ophthalmology  Discharge Exam: BP (!) 177/115 (BP Location: Left Arm)   Pulse (!) 108   Temp 98 F (36.7 C) (Oral)   Resp 12   Ht 5\' 4"  (1.626 m)   Wt 59.1 kg   SpO2 100%   BMI 22.36 kg/m   General: chronically ill appearing, weak, but NAD, aaox3 Cardiovascular: slight tachycardia Respiratory: normal respiratory effort  Discharge Instructions You were cared for by a hospitalist during your hospital stay. If you have any questions about your discharge medications or the care you received while you were in the hospital after you are discharged, you can call the unit and asked to speak with the hospitalist on call if the hospitalist that took care of you is not available. Once you are discharged, your primary care  physician will handle any further medical issues. Please note that NO REFILLS for any discharge medications will be authorized once you are discharged, as it is imperative that you return to your primary care physician (or establish a relationship with a primary care physician if you do not have one) for your aftercare needs so that they can reassess your need for medications and monitor your lab values.  Discharge Instructions     Diet - low sodium heart healthy   Complete by: As directed    Carb modified  diet   Increase activity slowly   Complete by: As directed       Allergies as of  04/22/2021       Reactions   Hydromorphone Hcl Itching, Rash        Medication List     STOP taking these medications    amLODipine 10 MG tablet Commonly known as: NORVASC   gabapentin 600 MG tablet Commonly known as: NEURONTIN       TAKE these medications    atropine 1 % ophthalmic ointment Place 1 application into both eyes daily.   bisacodyl 10 MG suppository Commonly known as: DULCOLAX Place 1 suppository (10 mg total) rectally daily as needed for moderate constipation.   Dexcom G6 Sensor Misc Apply topically.   Dexcom G6 Sensor Misc   Dexcom G6 Transmitter Misc   enalapril 5 MG tablet Commonly known as: VASOTEC Take 5 mg by mouth 2 (two) times daily.   insulin lispro 100 UNIT/ML KwikPen Commonly known as: HUMALOG Inject 2-5 Units into the skin 3 (three) times daily as needed (for a high BGL). Per sliding scale   labetalol 100 MG tablet Commonly known as: NORMODYNE Take 100 mg by mouth 2 (two) times daily.   Lantus SoloStar 100 UNIT/ML Solostar Pen Generic drug: insulin glargine Inject 5 Units into the skin daily. And pen needles 1/day What changed:  how much to take when to take this reasons to take this additional instructions   methocarbamol 750 MG tablet Commonly known as: ROBAXIN Take 750 mg by mouth every 8 (eight) hours as needed for muscle spasms.   moxifloxacin 0.5 % ophthalmic solution Commonly known as: VIGAMOX Place 1 drop into both eyes daily.   neomycin-polymyxin-dexameth 0.1 % Oint Commonly known as: MAXITROL Place 1 application into both eyes daily.   omeprazole 20 MG capsule Commonly known as: PRILOSEC Take 20 mg by mouth daily.   ondansetron 4 MG tablet Commonly known as: ZOFRAN Take 1 tablet (4 mg total) by mouth every 8 (eight) hours as needed for nausea or vomiting.   oxyCODONE-acetaminophen 10-325 MG tablet Commonly known as: PERCOCET Take 1 tablet by mouth every 4 (four) hours as needed for pain.   Pen  Needles 30G X 8 MM Misc 1 each by Does not apply route daily. E11.9   potassium chloride SA 20 MEQ tablet Commonly known as: KLOR-CON Take 1 tablet (20 mEq total) by mouth 2 (two) times daily.   prednisoLONE acetate 1 % ophthalmic suspension Commonly known as: PRED FORTE Place 1 drop into both eyes daily.   rosuvastatin 20 MG tablet Commonly known as: CRESTOR Take 20 mg by mouth at bedtime.   torsemide 20 MG tablet Commonly known as: Demadex Take 1 tablet (20 mg total) by mouth 2 (two) times daily.   Vitamin D (Ergocalciferol) 1.25 MG (50000 UNIT) Caps capsule Commonly known as: DRISDOL Take 50,000 Units by mouth every Tuesday.       Allergies  Allergen Reactions   Hydromorphone Hcl Itching and Rash    Follow-up Information     Wallene Huh, MD Follow up.   Specialty: Endocrinology Contact information: [place] [address] [place], [place] Follow up.   Contact information: [place] [address] [phone]         Riki Sheer, NP Follow up.   Specialty: Nurse Practitioner Contact information: [place] [address] [phone]                  The results of significant diagnostics from this hospitalization (including imaging, microbiology, ancillary and laboratory) are listed below for reference.    Significant Diagnostic Studies: CT ABDOMEN PELVIS WO CONTRAST  Result Date: 04/19/2021 CLINICAL DATA:  Abdominal pain, DKA EXAM: CT ABDOMEN AND PELVIS WITHOUT CONTRAST TECHNIQUE: Multidetector CT imaging of the abdomen and pelvis was performed following the standard protocol without IV contrast. Unenhanced CT was performed per clinician order. Lack of IV contrast limits sensitivity and specificity, especially for evaluation of abdominal/pelvic solid viscera. COMPARISON:  02/20/2021 FINDINGS: Lower chest: There are bilateral pleural effusions, left  greater than right. Dependent atelectasis is seen within the lower lobes. Hepatobiliary: No focal liver abnormality is seen. No gallstones, gallbladder wall thickening, or biliary dilatation. Pancreas: Limited unenhanced evaluation the pancreas demonstrates no focal abnormalities. Spleen: Normal in size without focal abnormality. Adrenals/Urinary Tract: No urinary tract calculi or obstructive uropathy. Bladder is decompressed with a Foley catheter. The adrenals are unremarkable. Stomach/Bowel: No bowel obstruction or ileus. Normal retrocecal appendix. No bowel wall thickening or inflammatory change. Moderate retained stool within the rectal vault. Vascular/Lymphatic: Mild atherosclerosis throughout the aorta and its branches. No pathologic adenopathy. Reproductive: Uterus and bilateral adnexa are unremarkable. Bilateral tubal ligation clips. Other: Small volume ascites throughout the abdomen and pelvis. There is diffuse mesenteric edema, as well as diffuse body wall edema compatible with anasarca. No free intraperitoneal gas. No abdominal wall hernia. Musculoskeletal: No acute or destructive bony lesions. Reconstructed images demonstrate no additional findings. IMPRESSION: 1. Diffuse anasarca, with body wall edema, mesenteric edema, and small volume ascites. 2. Small bilateral pleural effusions, left greater than right. 3. Otherwise unremarkable limited unenhanced evaluation of the abdomen and pelvis. Electronically Signed   By: Randa Ngo M.D.   On: 04/19/2021 20:36   DG Chest 2 View  Result Date: 04/22/2021 CLINICAL DATA:  Follow-up pneumonia EXAM: CHEST - 2 VIEW COMPARISON:  04/21/2021 FINDINGS: Subsegmental atelectasis in the right lower lung. Small left effusion and basilar airspace disease without change. Normal cardiomediastinal silhouette. IMPRESSION: No significant change in small left effusion and adjacent basilar airspace disease, probably pneumonia. Subsegmental atelectasis right lung base  Electronically Signed   By: Donavan Foil M.D.   On: 04/22/2021 16:47   CT HEAD WO CONTRAST (5MM)  Result Date: 04/18/2021 CLINICAL DATA:  Altered mental status and hyperglycemia EXAM: CT HEAD WITHOUT CONTRAST TECHNIQUE: Contiguous axial images were obtained from the base of the skull through the vertex without intravenous contrast. COMPARISON:  None. FINDINGS: Brain: No evidence of acute infarction, hemorrhage, hydrocephalus, extra-axial collection or mass lesion/mass effect. Vascular: No hyperdense vessel or unexpected calcification. Skull: Normal. Negative for fracture or focal lesion. Sinuses/Orbits: No acute finding. Other: None. IMPRESSION: No acute intracranial abnormality noted. Electronically Signed   By: Inez Catalina M.D.   On: 04/18/2021 21:37   MR BRAIN WO CONTRAST  Result Date: 04/19/2021  CLINICAL DATA:  Neuro deficit, ICU patient EXAM: MRI HEAD WITHOUT CONTRAST TECHNIQUE: Multiplanar, multiecho pulse sequences of the brain and surrounding structures were obtained without intravenous contrast. COMPARISON:  Noncontrast CT head obtained earlier the same day FINDINGS: There is intermittent motion degradation, severe on some sequences including the coronal T2 and sagittal T1. Brain: There is no evidence of acute intracranial hemorrhage, extra-axial fluid collection, or acute infarct. Parenchymal signal is normal. Parenchymal volume is normal. The ventricles are not enlarged. There is no mass lesion.  There is no midline shift. Vascular: Normal flow voids. Skull and upper cervical spine: No definite marrow signal abnormality. Sinuses/Orbits: Postprocedural changes are again noted in the left globe. The imaged paranasal sinuses are clear. Other: None. IMPRESSION: No acute intracranial pathology, within the confines of intermittent motion degradation as above. Electronically Signed   By: Valetta Mole M.D.   On: 04/19/2021 17:52   US RENAL  Result Date: 04/19/2021 CLINICAL DATA:  AK I EXAM: RENAL  / URINARY TRACT ULTRASOUND COMPLETE COMPARISON:  CT abdomen pelvis 02/20/2021 FINDINGS: Right Kidney: Renal measurements: 9.5 x 4.8 x 5.1 cm = volume: 123 mL. Diffusely increased echogenicity. No mass or hydronephrosis visualized. Left Kidney: Renal measurements: 9.4 x 4.8 x 3.8 cm = volume: 87 mL. Diffusely increased echogenicity. No mass or hydronephrosis visualized. Bladder: Possible small amount of dependent debris. Other: None. IMPRESSION: 1. Limited exam secondary to patient clinical condition. Diffusely increased echogenicity of the bilateral kidneys as can be seen in medical renal disease. 2. Possible small amount of debris in the bladder. Correlate with urinalysis. Electronically Signed   By: Audie Pinto M.D.   On: 04/19/2021 11:04   DG CHEST PORT 1 VIEW  Result Date: 04/21/2021 CLINICAL DATA:  Chest pain EXAM: PORTABLE CHEST 1 VIEW COMPARISON:  04/20/2021 FINDINGS: Cardiac size is within normal limits. There is increased density in the left lower lung fields. Small transverse linear density is seen in the lateral right lower lung fields. Rest of the lung fields are unremarkable. There are no signs of pulmonary edema. There is blunting of left lateral CP angle. There is no pneumothorax. IMPRESSION: There is interval increase in the opacity in the left lower lung fields. This suggests possible increase in the left pleural effusion and worsening of pneumonia. Small subsegmental atelectasis is seen in the right lower lung fields. Electronically Signed   By: Elmer Picker M.D.   On: 04/21/2021 13:01   DG CHEST PORT 1 VIEW  Result Date: 04/20/2021 CLINICAL DATA:  Confusion, abnormal chest x-ray. EXAM: PORTABLE CHEST 1 VIEW COMPARISON:  April 19, 2021. FINDINGS: The heart size and mediastinal contours are within normal limits. Mildly increased bibasilar opacities are noted concerning for atelectasis or possibly infiltrates. Minimal left pleural effusion may be present. The visualized  skeletal structures are unremarkable. IMPRESSION: Mildly increased bibasilar opacities are noted concerning for atelectasis or possibly infiltrates. Minimal left pleural effusion may be present. Electronically Signed   By: Marijo Conception M.D.   On: 04/20/2021 08:39   DG CHEST PORT 1 VIEW  Result Date: 04/19/2021 CLINICAL DATA:  Hypothermia, altered level of consciousness EXAM: PORTABLE CHEST 1 VIEW COMPARISON:  01/26/2021 FINDINGS: Single frontal view of the chest demonstrates an unremarkable cardiac silhouette. Patchy areas of airspace disease are seen within the bilateral lower lobes, left greater than right. No effusion or pneumothorax. No acute bony abnormalities. IMPRESSION: 1. Patchy bibasilar airspace disease, consistent with multifocal pneumonia. Electronically Signed   By: Diana Eves.D.  On: 04/19/2021 16:33   CT HEAD CODE STROKE WO CONTRAST  Addendum Date: 04/19/2021   ADDENDUM REPORT: 04/19/2021 09:21 ADDENDUM: Study discussed by telephone with Dr. Hulen Luster JR on 04/19/2021 at 0905 hours. And he determined the patient had left-side ophthalmic surgery in September. Electronically Signed   By: Genevie Ann M.D.   On: 04/19/2021 09:21   Result Date: 04/19/2021 CLINICAL DATA:  Code stroke.  30 year old female with uneven pupils. EXAM: CT HEAD WITHOUT CONTRAST TECHNIQUE: Contiguous axial images were obtained from the base of the skull through the vertex without intravenous contrast. COMPARISON:  Head CT without contrast yesterday. FINDINGS: Brain: Intermittent motion artifact. Normal cerebral volume. No midline shift, ventriculomegaly, mass effect, evidence of mass lesion, intracranial hemorrhage or evidence of cortically based acute infarction. Gray-white matter differentiation is within normal limits throughout the brain. Vascular: Mild Calcified atherosclerosis at the skull base. No suspicious intracranial vascular hyperdensity. Skull: Motion artifact at the skull base. No acute osseous  abnormality identified. Sinuses/Orbits: Visualized paranasal sinuses and mastoids are stable and well aerated. Other: Stable presumed postoperative changes to the left globe with gas within the vitreous. Negative visible scalp. ASPECTS [facility] Stroke Program Early CT Score) Total score (0-10 with 10 being normal): 10 IMPRESSION: 1. Stable and normal noncontrast CT appearance of the brain when allowing for motion artifact today. ASPECTS 10. 2. Stable presumed recent postoperative changes to the left globe with gas within the vitreous. Electronically Signed: By: Genevie Ann M.D. On: 04/19/2021 09:02    Microbiology: Recent Results (from the past 240 hour(s))  Resp Panel by RT-PCR (Flu A&B, Covid) Nasopharyngeal Swab     Status: None   Collection Time: 04/18/21  6:00 PM   Specimen: Nasopharyngeal Swab; Nasopharyngeal(NP) swabs in vial transport medium  Result Value Ref Range Status   SARS Coronavirus 2 by RT PCR NEGATIVE NEGATIVE Final    Comment: (NOTE) SARS-CoV-2 target nucleic acids are NOT DETECTED.  The SARS-CoV-2 RNA is generally detectable in upper respiratory specimens during the acute phase of infection. The lowest concentration of SARS-CoV-2 viral copies this assay can detect is 138 copies/mL. A negative result does not preclude SARS-Cov-2 infection and should not be used as the sole basis for treatment or other patient management decisions. A negative result may occur with  improper specimen collection/handling, submission of specimen other than nasopharyngeal swab, presence of viral mutation(s) within the areas targeted by this assay, and inadequate number of viral copies(<138 copies/mL). A negative result must be combined with clinical observations, patient history, and epidemiological information. The expected result is Negative.  Fact Sheet for Patients:  EntrepreneurPulse.com.au  Fact Sheet for Healthcare Providers:   IncredibleEmployment.be  This test is no t yet approved or cleared by the [place] FDA and  has been authorized for detection and/or diagnosis of SARS-CoV-2 by FDA under an Emergency Use Authorization (EUA). This EUA will remain  in effect (meaning this test can be used) for the duration of the COVID-19 declaration under Section 564(b)(1) of the Act, 21 U.S.C.section 360bbb-3(b)(1), unless the authorization is terminated  or revoked sooner.       Influenza A by PCR NEGATIVE NEGATIVE Final   Influenza B by PCR NEGATIVE NEGATIVE Final    Comment: (NOTE) The Xpert Xpress SARS-CoV-2/FLU/RSV plus assay is intended as an aid in the diagnosis of influenza from Nasopharyngeal swab specimens and should not be used as a sole basis for treatment. Nasal washings and aspirates are unacceptable for Xpert Xpress SARS-CoV-2/FLU/RSV testing.  Fact  Sheet for Patients: EntrepreneurPulse.com.au  Fact Sheet for Healthcare Providers: IncredibleEmployment.be  This test is not yet approved or cleared by the [place] FDA and has been authorized for detection and/or diagnosis of SARS-CoV-2 by FDA under an Emergency Use Authorization (EUA). This EUA will remain in effect (meaning this test can be used) for the duration of the COVID-19 declaration under Section 564(b)(1) of the Act, 21 U.S.C. section 360bbb-3(b)(1), unless the authorization is terminated or revoked.  Performed at [facility], [place] [address], [place], [place] [address]   MRSA Next Gen by PCR, Nasal     Status: None   Collection Time: 04/19/21  1:00 AM   Specimen: Nasal Mucosa; Nasal Swab  Result Value Ref Range Status   MRSA by PCR Next Gen NOT DETECTED NOT DETECTED Final    Comment: (NOTE) The GeneXpert MRSA Assay (FDA approved for NASAL specimens only), is one component of a comprehensive MRSA colonization surveillance program. It is not intended  to diagnose MRSA infection nor to guide or monitor treatment for MRSA infections. Test performance is not FDA approved in patients less than 30 years old. Performed at [facility], [place] [address], [place], [place] [address]   Urine Culture     Status: Abnormal   Collection Time: 04/19/21  6:00 AM   Specimen: Urine, Random  Result Value Ref Range Status   Specimen Description   Final    URINE, RANDOM Performed at [place] [address] [place], [place] [address]    Special Requests   Final    NONE Performed at [facility], [place] [address], [place], [place] [address]    Culture (A)  Final    <10,000 COLONIES/mL INSIGNIFICANT GROWTH Performed at [place] [address], [place], [place] [address]    Report Status 04/20/2021 FINAL  Final  Culture, blood (routine x 2)     Status: None (Preliminary result)   Collection Time: 04/19/21 10:24 AM   Specimen: BLOOD  Result Value Ref Range Status   Specimen Description   Final    BLOOD BLOOD RIGHT HAND Performed at [place] [address] [place], [place] [address]    Special Requests   Final    BOTTLES DRAWN AEROBIC ONLY Blood Culture adequate volume Performed at [facility] [address], [place], [place] [address]    Culture   Final    NO GROWTH 3 DAYS Performed at [facility], [place] [address] [place], [place] [address]    Report Status PENDING  Incomplete  Culture, blood (routine x 2)     Status: None (Preliminary result)   Collection Time: 04/19/21 10:24 AM   Specimen: BLOOD  Result Value Ref Range Status   Specimen Description   Final    BLOOD BLOOD RIGHT WRIST Performed at [facility] [address], [place], [place] [address]    Special Requests   Final    BOTTLES DRAWN AEROBIC ONLY Blood Culture adequate volume Performed at [facility] [address] [place], [place] [address]    Culture   Final    NO GROWTH 3 DAYS Performed at [facility], [place] [address] [place], [place] [address]    Report Status PENDING  Incomplete     Labs: Basic Metabolic Panel: Recent Labs  Lab 04/18/21 1742 04/18/21 1951 04/19/21 0826 04/19/21 1240 04/19/21 1832 04/20/21 0330 04/21/21  0401  NA 123*   < > 129* 132* 134* 135 137  K 7.2*   < > 4.5 4.4 4.0 4.0 4.1  CL 93*   < > 100 104 105 104 104  CO2 <7*   < > 15* 19* 21* 22 24  GLUCOSE 1,153*   < > 682* 478* 207* 84 187*  BUN 63*   < > 58* 56* 52* 50* 42*  CREATININE 4.73*   < > 4.24* 4.10* 3.62* 3.50* 3.11*  CALCIUM 8.7*   < > 8.5* 8.7* 8.8* 8.6* 8.5*  MG 2.3  --   --   --   --  1.6* 1.9  PHOS  --   --   --   --   --  2.9 3.4   < > = values in this interval not displayed.   Liver Function Tests: Recent Labs  Lab 04/19/21 1024 04/20/21 0330 04/21/21 0401  AST 25 23 14*  ALT 22 20 12   ALKPHOS 95 84 58  BILITOT 0.9 0.6 1.2  PROT 5.0* 4.8* 4.8*  ALBUMIN 1.9* 1.6* 2.4*   No results for input(s): LIPASE, AMYLASE in the last 168 hours. Recent Labs  Lab 04/19/21 1024  AMMONIA 22   CBC: Recent Labs  Lab 04/18/21 1742 04/19/21 0826 04/20/21 0330 04/21/21 0401 04/21/21 2113  WBC 9.4 12.7* 11.5* 6.9  --   NEUTROABS 8.1* 11.0* 9.1* 5.4  --   HGB 8.6* 8.3* 9.4* 7.4* 8.1*  HCT 25.9* 23.5* 26.4* 21.4* 23.3*  MCV 91.5 85.1 84.6 87.0  --   PLT 378 298 254 205  --    Cardiac Enzymes: No results for input(s): CKTOTAL, CKMB, CKMBINDEX, TROPONINI in the last 168 hours. BNP: BNP (last 3 results) Recent Labs    02/14/21 2130  BNP 122.7*    ProBNP (last 3 results) No results for input(s): PROBNP in the last 8760 hours.  CBG: Recent Labs  Lab 04/22/21 0904 04/22/21 1257 04/22/21 1618 04/22/21 1643 04/22/21 1712  GLUCAP 159* 163* 78 68* 79       Signed:  Florencia Reasons MD, PhD, FACP  [facility] 04/22/2021, 8:01 PM

## 2021-04-22 NOTE — Progress Notes (Incomplete)
-   issue with swallowing - diarrhea concerns - "problem with my heart" - pn mgmt - transition to PO/home regimen? (MD says need to watch renal fx) - lab refusals

## 2021-04-22 NOTE — Progress Notes (Addendum)
Inpatient Diabetes Program Recommendations  AACE/ADA: New Consensus Statement on Inpatient Glycemic Control (2015)  Target Ranges:  Prepandial:   less than 140 mg/dL      Peak postprandial:   less than 180 mg/dL (1-2 hours)      Critically ill patients:  140 - 180 mg/dL    Latest Reference Range & Units 04/21/21 07:39 04/21/21 12:25 04/21/21 17:34 04/21/21 20:22 04/21/21 23:52  Glucose-Capillary 70 - 99 mg/dL 245 (H)  2 units Novolog  263 (H)  3 units Novolog  5 units Semglee @1117   166 (H)  1 unit Novolog  142 (H) 86    Latest Reference Range & Units 04/22/21 04:19 04/22/21 04:38 04/22/21 05:11  Glucose-Capillary 70 - 99 mg/dL 48 (L) 66 (L) 97    History: Type 1 Diabetes, CKD   Home DM Meds: Dexcom Sensor (NOT using)                              Lantus 2-5 units Daily PRN for high CBG                              Humalog 2-5 units TID                                 Current Orders: Semglee 5 units daily                            Novolog 0-6 units TID AC       Novolog 2 units TID with meals   MD- Note Severe Hypoglycemia this AM.  Unsure if the Semglee or the Novolog caused this HYPO event--Suspect Semglee as pt only received 1 unit Novolog with Dinner  Please consider reducing the Semglee to 3 units Daily    Endocrinologist: Dr. Janese Banks with [facility] Initial consult 03/09/2021 Was instructed to take the following: Diabetes regimen: -Continue long acting insulin at 5 units per day, ideally take same time every day ~9 am -Adjust insulin to carb ratio to 1 unit for every 30 grams carbohydrates -Adjust correction scale to the following when eating and when not eating: If BG 200-250 - take 1 additional unit If BG 251- 300- take 2 additional units If BG 301 -350 - take 3 additional units If BG 351 - 400 - take 4 additional units  If BG > 400 - take 4 units total   --Will follow patient during hospitalization--  Wyn Quaker RN, MSN, CDE Diabetes  Coordinator Inpatient Glycemic Control Team Team Pager: [phone] (8a-5p)

## 2021-04-22 NOTE — Progress Notes (Addendum)
Foley catheter removed at 1330 per MD order.  Assisted pt to [facility], pt had watery stool and reported urinating.  Patient had second urine occurrence at 1630 of 425 cc pale yellow clear urine.  Angie Fava, RN

## 2021-04-22 NOTE — Progress Notes (Addendum)
PROGRESS NOTE    Ann Robertson  UEA:[id number] DOB: 12/19/1990 DOA: 04/18/2021 PCP: [facility] [place]    Chief Complaint  Patient presents with   Hyperglycemia    Brief Narrative:  Ann Robertson is a 30 y.o. female with medical history significant for type 1 diabetes mellitus, neuropathy secondary to diabetes, noncompliant with diabetes care who presents by EMS from home with altered mental status and elevated blood sugar readings.  According to chart patient was recently admitted for hypokalemia, anemia and hypertension but was discharged a week ago.  Ann Robertson is not able to provide any history secondary to altered mental status.  No family is at bedside.  She mumbles incoherently and does not follow commands.  Four-point restraints in the emergency room.  Subjective:  Hypoglycemia this am, blood glucose at 66 around 4-5 am, she did not reports any associated symptom, she tried to eat more this am ,reports she ate 75% of the breakfast She has Bilateral pedal edema Bp elevated She is on prn iv fentanyl and prn iv ativan She appears weak but She  states wants to go home, she said she has 4kids staying with her family   Assessment & Plan:   Principal Problem:   DKA, type 1 ([place]) Active Problems:   Community acquired pneumonia   Noncompliance with diabetes treatment   DKA (diabetic ketoacidosis) ([place])   Anemia   Anasarca   Acute renal failure superimposed on stage 3 chronic kidney disease, unspecified acute renal failure type, unspecified whether stage 3a or 3b CKD ([place])   Prolonged QT interval   Acute metabolic encephalopathy   Hyperkalemia   Sepsis ([place])   Bilateral fixed dilatation of pupils   CKD (chronic kidney disease), stage III ([place])   Hyponatremia   Acute urinary retention   Lactic acid increased   Chest pain   Sore throat   DKA/type 1 diabetes/acute metabolic encephalopathy/pseudohyponatremia Presented with severe hypoglycemia and altered mental  status DKA resolved now has hypoglycemia Report her diabetes doctor is Dr. Ilona Robertson Patient will benefit from getting CGM Appreciate inpatient diabetes coordinator input  Hypoglycemia Likely from poor oral intake, she had a normal gastric emptying study in January 22 On hypoglycemia protocol,  Decrease semglee to 3units qhs Start glucerna , will ask dietitian to see  Per previous attending patient has sepsis presents on admission likely from Pna Urine strep pneumo antigen negative.  Urine Legionella antigen negative Cannot rule out aspiration pneumonia when she was altered She did well with speech therapy on 11/22 Appeared responded to Zosyn well, leukocytosis resolved, will continue Zosyn for total 5 days treatment  Anasarca/proteinuria, likely from diabetic nephropathy Still have pedal edema Continue iv lasix/albumin Plan to discontinue Norvasc due to pedal edema  AKI on CKDIV, anemia of chronic disease, hemoglobin appears to be stable at 8 Cr on admission was 4.73, today is 3.1 at baseline Renal dosing meds  Acute urinary retention Received in and out cath and then Foley insertion Mental status improved, voiding trial today  Hypertension Uncontrolled Increase labetalol, start low-dose hydralazine/Imdur Currently on IV Lasix, on torsemide at home plan to resume at discharge On Vasotec at home, plan to resume at discharge  Chronic pain, likely has diabetic neuropathy  Appear to be on Percocet at home, will try to avoid oxycodone in the setting of renal impairment She reports allergy to dilaudid Has been on iv fentanyl, will reduced dose due to drowsiness Will start low dose neurontin at night   Sore throat  Magic mouthwash Seen by speech, passed swallow eval  nonReactive pupils Noticed by RN on 11/20 a.m., code stroke called, CT head MRI no acute findings Of note, s/p PPV 25 GA/EL/MP OS, indirect laser ophthalmoscopy OD 04/14/2021 (see care everywhere note) Patient  states she is seeing floaters, I talked to [facility] on call who recommend to reach to her primary ophthalmology group for recommendation, I talked to [facility] on call ophthalmologist resident who states patient does normally see floaters post op, he advise to transfer to [facility] for ophthalmology .  Nutritional Assessment: The patient's BMI is: Body mass index is 22.36 kg/m.Marland Kitchen     Unresulted Labs (From admission, onward)     Start     Ordered   04/22/21 0500  CBC with Differential/Platelet  Tomorrow morning,   R       Question:  Specimen collection method  Answer:  Lab=Lab collect   04/21/21 1945   04/20/21 3532  Basic metabolic panel  Daily,   R     Question:  Specimen collection method  Answer:  Lab=Lab collect   04/19/21 1911              DVT prophylaxis: Place TED hose Start: 04/22/21 0933 heparin injection 5,000 Units Start: 04/19/21 0600   Code Status:full Family Communication: Patient Disposition:   Status is: Inpatient  Dispo: The patient is from: Home              Anticipated d/c is to: Home              Anticipated d/c date is: TBD, Still have hypoglycemia episode                Consultants:  Tele Neurology on 11/20  Procedures:  none  Antimicrobials:   Anti-infectives (From admission, onward)    Start     Dose/Rate Route Frequency Ordered Stop   04/21/21 0600  piperacillin-tazobactam (ZOSYN) IVPB 3.375 g        3.375 g 12.5 mL/hr over 240 Minutes Intravenous Every 8 hours 04/21/21 0536 04/23/21 2359   04/19/21 2200  piperacillin-tazobactam (ZOSYN) IVPB 3.375 g  Status:  Discontinued        3.375 g 12.5 mL/hr over 240 Minutes Intravenous Every 12 hours 04/19/21 1014 04/21/21 0536   04/19/21 1015  piperacillin-tazobactam (ZOSYN) IVPB 3.375 g        3.375 g 100 mL/hr over 30 Minutes Intravenous STAT 04/19/21 0953 04/19/21 1055           Objective: Vitals:   04/22/21 0432 04/22/21 1238 04/22/21 1414 04/22/21  1459  BP: (!) 142/96 (!) 176/120 (!) 170/113 (!) 177/115  Pulse: 93 (!) 116  (!) 108  Resp: 18 20  12   Temp: 97.7 F (36.5 C) 97.9 F (36.6 C)  98 F (36.7 C)  TempSrc: Oral Oral  Oral  SpO2: 100% 100%  100%  Weight: 59.1 kg     Height:        Intake/Output Summary (Last 24 hours) at 04/22/2021 1834 Last data filed at 04/22/2021 1630 Gross per 24 hour  Intake 700 ml  Output 2751 ml  Net -2051 ml   Filed Weights   04/20/21 0500 04/21/21 0422 04/22/21 0432  Weight: 62.8 kg 63.5 kg 59.1 kg    Examination:  General exam: alert, awake, communicative,calm, NAD Respiratory system: diminished at bases, no wheezing, no rales, no rhonchi respiratory effort normal. Cardiovascular system:   slightly sinus tachycardia Gastrointestinal system: Abdomen is  nondistended, soft and nontender.  Normal bowel sounds heard. Central nervous system: Alert and oriented. No focal neurological deficits. Extremities:  pedal edema Skin: No rashes, lesions or ulcers Psychiatry: Judgement and insight appear normal. Mood & affect appropriate.     Data Reviewed: I have personally reviewed following labs and imaging studies  CBC: Recent Labs  Lab 04/18/21 1742 04/19/21 0826 04/20/21 0330 04/21/21 0401 04/21/21 2113  WBC 9.4 12.7* 11.5* 6.9  --   NEUTROABS 8.1* 11.0* 9.1* 5.4  --   HGB 8.6* 8.3* 9.4* 7.4* 8.1*  HCT 25.9* 23.5* 26.4* 21.4* 23.3*  MCV 91.5 85.1 84.6 87.0  --   PLT 378 298 254 205  --     Basic Metabolic Panel: Recent Labs  Lab 04/18/21 1742 04/18/21 1951 04/19/21 0826 04/19/21 1240 04/19/21 1832 04/20/21 0330 04/21/21 0401  NA 123*   < > 129* 132* 134* 135 137  K 7.2*   < > 4.5 4.4 4.0 4.0 4.1  CL 93*   < > 100 104 105 104 104  CO2 <7*   < > 15* 19* 21* 22 24  GLUCOSE 1,153*   < > 682* 478* 207* 84 187*  BUN 63*   < > 58* 56* 52* 50* 42*  CREATININE 4.73*   < > 4.24* 4.10* 3.62* 3.50* 3.11*  CALCIUM 8.7*   < > 8.5* 8.7* 8.8* 8.6* 8.5*  MG 2.3  --   --   --   --   1.6* 1.9  PHOS  --   --   --   --   --  2.9 3.4   < > = values in this interval not displayed.    GFR: Estimated Creatinine Clearance: 22.4 mL/min (A) (by C-G formula based on SCr of 3.11 mg/dL (H)).  Liver Function Tests: Recent Labs  Lab 04/19/21 1024 04/20/21 0330 04/21/21 0401  AST 25 23 14*  ALT 22 20 12   ALKPHOS 95 84 58  BILITOT 0.9 0.6 1.2  PROT 5.0* 4.8* 4.8*  ALBUMIN 1.9* 1.6* 2.4*    CBG: Recent Labs  Lab 04/22/21 0904 04/22/21 1257 04/22/21 1618 04/22/21 1643 04/22/21 1712  GLUCAP 159* 163* 78 68* 79     Recent Results (from the past 240 hour(s))  Resp Panel by RT-PCR (Flu A&B, Covid) Nasopharyngeal Swab     Status: None   Collection Time: 04/18/21  6:00 PM   Specimen: Nasopharyngeal Swab; Nasopharyngeal(NP) swabs in vial transport medium  Result Value Ref Range Status   SARS Coronavirus 2 by RT PCR NEGATIVE NEGATIVE Final    Comment: (NOTE) SARS-CoV-2 target nucleic acids are NOT DETECTED.  The SARS-CoV-2 RNA is generally detectable in upper respiratory specimens during the acute phase of infection. The lowest concentration of SARS-CoV-2 viral copies this assay can detect is 138 copies/mL. A negative result does not preclude SARS-Cov-2 infection and should not be used as the sole basis for treatment or other patient management decisions. A negative result may occur with  improper specimen collection/handling, submission of specimen other than nasopharyngeal swab, presence of viral mutation(s) within the areas targeted by this assay, and inadequate number of viral copies(<138 copies/mL). A negative result must be combined with clinical observations, patient history, and epidemiological information. The expected result is Negative.  Fact Sheet for Patients:  EntrepreneurPulse.com.au  Fact Sheet for Healthcare Providers:  IncredibleEmployment.be  This test is no t yet approved or cleared by the [place]  FDA and  has been authorized for detection and/or  diagnosis of SARS-CoV-2 by FDA under an Emergency Use Authorization (EUA). This EUA will remain  in effect (meaning this test can be used) for the duration of the COVID-19 declaration under Section 564(b)(1) of the Act, 21 U.S.C.section 360bbb-3(b)(1), unless the authorization is terminated  or revoked sooner.       Influenza A by PCR NEGATIVE NEGATIVE Final   Influenza B by PCR NEGATIVE NEGATIVE Final    Comment: (NOTE) The Xpert Xpress SARS-CoV-2/FLU/RSV plus assay is intended as an aid in the diagnosis of influenza from Nasopharyngeal swab specimens and should not be used as a sole basis for treatment. Nasal washings and aspirates are unacceptable for Xpert Xpress SARS-CoV-2/FLU/RSV testing.  Fact Sheet for Patients: EntrepreneurPulse.com.au  Fact Sheet for Healthcare Providers: IncredibleEmployment.be  This test is not yet approved or cleared by the [place] FDA and has been authorized for detection and/or diagnosis of SARS-CoV-2 by FDA under an Emergency Use Authorization (EUA). This EUA will remain in effect (meaning this test can be used) for the duration of the COVID-19 declaration under Section 564(b)(1) of the Act, 21 U.S.C. section 360bbb-3(b)(1), unless the authorization is terminated or revoked.  Performed at [facility], [place] [address] [place], [place] [address]   MRSA Next Gen by PCR, Nasal     Status: None   Collection Time: 04/19/21  1:00 AM   Specimen: Nasal Mucosa; Nasal Swab  Result Value Ref Range Status   MRSA by PCR Next Gen NOT DETECTED NOT DETECTED Final    Comment: (NOTE) The GeneXpert MRSA Assay (FDA approved for NASAL specimens only), is one component of a comprehensive MRSA colonization surveillance program. It is not intended to diagnose MRSA infection nor to guide or monitor treatment for MRSA infections. Test performance is not  FDA approved in patients less than 20 years old. Performed at [facility], [place] [address] [place], [place] [address]   Urine Culture     Status: Abnormal   Collection Time: 04/19/21  6:00 AM   Specimen: Urine, Random  Result Value Ref Range Status   Specimen Description   Final    URINE, RANDOM Performed at [facility] [address], [place], [place] [address]    Special Requests   Final    NONE Performed at [facility], [place] [address] [place], [place] [address]    Culture (A)  Final    <10,000 COLONIES/mL INSIGNIFICANT GROWTH Performed at [facility] [address] [place], [place] [address]    Report Status 04/20/2021 FINAL  Final  Culture, blood (routine x 2)     Status: None (Preliminary result)   Collection Time: 04/19/21 10:24 AM   Specimen: BLOOD  Result Value Ref Range Status   Specimen Description   Final    BLOOD BLOOD RIGHT HAND Performed at [facility] [address], [place], [place] [address]    Special Requests   Final    BOTTLES DRAWN AEROBIC ONLY Blood Culture adequate volume Performed at [facility] [address] [place], [place] [address]    Culture   Final    NO GROWTH 3 DAYS Performed at [facility], [place] [address], [place], [place] [address]    Report Status PENDING  Incomplete  Culture, blood (routine x 2)     Status: None (Preliminary result)   Collection Time: 04/19/21 10:24 AM   Specimen: BLOOD  Result Value Ref Range Status  Specimen Description   Final    BLOOD BLOOD RIGHT WRIST Performed at [facility] [address] [place], [place] [address]    Special Requests   Final    BOTTLES DRAWN AEROBIC ONLY Blood Culture adequate volume Performed at [facility] [address], [place], [place] [address]    Culture   Final    NO GROWTH 3 DAYS Performed at [facility],  [place] [address] [place], [place] [address]    Report Status PENDING  Incomplete         Radiology Studies: DG Chest 2 View  Result Date: 04/22/2021 CLINICAL DATA:  Follow-up pneumonia EXAM: CHEST - 2 VIEW COMPARISON:  04/21/2021 FINDINGS: Subsegmental atelectasis in the right lower lung. Small left effusion and basilar airspace disease without change. Normal cardiomediastinal silhouette. IMPRESSION: No significant change in small left effusion and adjacent basilar airspace disease, probably pneumonia. Subsegmental atelectasis right lung base Electronically Signed   By: Donavan Foil M.D.   On: 04/22/2021 16:47   DG CHEST PORT 1 VIEW  Result Date: 04/21/2021 CLINICAL DATA:  Chest pain EXAM: PORTABLE CHEST 1 VIEW COMPARISON:  04/20/2021 FINDINGS: Cardiac size is within normal limits. There is increased density in the left lower lung fields. Small transverse linear density is seen in the lateral right lower lung fields. Rest of the lung fields are unremarkable. There are no signs of pulmonary edema. There is blunting of left lateral CP angle. There is no pneumothorax. IMPRESSION: There is interval increase in the opacity in the left lower lung fields. This suggests possible increase in the left pleural effusion and worsening of pneumonia. Small subsegmental atelectasis is seen in the right lower lung fields. Electronically Signed   By: Elmer Picker M.D.   On: 04/21/2021 13:01        Scheduled Meds:  alum & mag hydroxide-simeth  30 mL Oral Once   chlorhexidine  15 mL Mouth Rinse BID   Chlorhexidine Gluconate Cloth  6 each Topical Q0600   feeding supplement (GLUCERNA SHAKE)  237 mL Oral TID BM   furosemide  60 mg Intravenous BID   gabapentin  100 mg Oral QHS   heparin  5,000 Units Subcutaneous Q8H   hydrALAZINE  10 mg Oral Q8H   insulin aspart  0-6 Units Subcutaneous TID WC   insulin aspart  2 Units Subcutaneous TID WC   insulin glargine-yfgn  3 Units Subcutaneous Daily   [START ON  04/23/2021] isosorbide mononitrate  15 mg Oral Daily   labetalol  200 mg Oral BID   mouth rinse  15 mL Mouth Rinse q12n4p   neomycin-bacitracin-polymyxin   Topical BID   neomycin-polymyxin-dexameth  1 application Left Eye Daily   pantoprazole (PROTONIX) IV  40 mg Intravenous Q24H   prednisoLONE acetate  1 drop Both Eyes Daily   saccharomyces boulardii  250 mg Oral BID   Continuous Infusions:  sodium chloride Stopped (04/19/21 1225)   piperacillin-tazobactam (ZOSYN)  IV 3.375 g (04/22/21 1315)     LOS: 4 days   Time spent: 72mins Greater than 50% of this time was spent in counseling, explanation of diagnosis, planning of further management, and coordination of care.   Voice Recognition Viviann Spare dictation system was used to create this note, attempts have been made to correct errors. Please contact the author with questions and/or clarifications.   Florencia Reasons, MD PhD FACP [facility]  Available via Epic secure chat 7am-7pm for nonurgent issues Please page for  urgent issues To page the attending provider between 7A-7P or the covering provider during after hours 7P-7A, please log into the web site [email or web address] and access using universal [place] password for that web site. If you do not have the password, please call the hospital operator.    04/22/2021, 6:34 PM

## 2021-04-23 LAB — GLUCOSE, CAPILLARY: Glucose-Capillary: 88 mg/dL (ref 70–99)

## 2021-04-24 LAB — CULTURE, BLOOD (ROUTINE X 2)
Culture: NO GROWTH
Culture: NO GROWTH
Special Requests: ADEQUATE
Special Requests: ADEQUATE

## 2021-04-30 ENCOUNTER — Observation Stay (HOSPITAL_COMMUNITY)
Admission: EM | Admit: 2021-04-30 | Discharge: 2021-05-01 | Disposition: A | Discharge status: Home / Self Care | Payer: Medicaid Other | Attending: Internal Medicine | Admitting: Internal Medicine

## 2021-04-30 ENCOUNTER — Emergency Department (HOSPITAL_COMMUNITY): Payer: Medicaid Other

## 2021-04-30 ENCOUNTER — Other Ambulatory Visit: Payer: Self-pay

## 2021-04-30 DIAGNOSIS — D649 Anemia, unspecified: Secondary | ICD-10-CM | POA: Diagnosis not present

## 2021-04-30 DIAGNOSIS — N1831 Chronic kidney disease, stage 3a: Secondary | ICD-10-CM

## 2021-04-30 DIAGNOSIS — R601 Generalized edema: Secondary | ICD-10-CM | POA: Diagnosis present

## 2021-04-30 DIAGNOSIS — N184 Chronic kidney disease, stage 4 (severe): Secondary | ICD-10-CM | POA: Diagnosis not present

## 2021-04-30 DIAGNOSIS — R0602 Shortness of breath: Secondary | ICD-10-CM | POA: Diagnosis present

## 2021-04-30 DIAGNOSIS — F17210 Nicotine dependence, cigarettes, uncomplicated: Secondary | ICD-10-CM | POA: Insufficient documentation

## 2021-04-30 DIAGNOSIS — D631 Anemia in chronic kidney disease: Secondary | ICD-10-CM | POA: Diagnosis present

## 2021-04-30 DIAGNOSIS — E1021 Type 1 diabetes mellitus with diabetic nephropathy: Secondary | ICD-10-CM | POA: Diagnosis present

## 2021-04-30 DIAGNOSIS — Z79899 Other long term (current) drug therapy: Secondary | ICD-10-CM | POA: Diagnosis not present

## 2021-04-30 DIAGNOSIS — E1022 Type 1 diabetes mellitus with diabetic chronic kidney disease: Secondary | ICD-10-CM | POA: Diagnosis not present

## 2021-04-30 DIAGNOSIS — E876 Hypokalemia: Secondary | ICD-10-CM | POA: Diagnosis not present

## 2021-04-30 DIAGNOSIS — N183 Chronic kidney disease, stage 3 unspecified: Secondary | ICD-10-CM | POA: Diagnosis present

## 2021-04-30 DIAGNOSIS — E1042 Type 1 diabetes mellitus with diabetic polyneuropathy: Secondary | ICD-10-CM | POA: Diagnosis not present

## 2021-04-30 DIAGNOSIS — I10 Essential (primary) hypertension: Secondary | ICD-10-CM | POA: Diagnosis not present

## 2021-04-30 DIAGNOSIS — Z20822 Contact with and (suspected) exposure to covid-19: Secondary | ICD-10-CM | POA: Insufficient documentation

## 2021-04-30 DIAGNOSIS — R0600 Dyspnea, unspecified: Secondary | ICD-10-CM

## 2021-04-30 DIAGNOSIS — R739 Hyperglycemia, unspecified: Secondary | ICD-10-CM

## 2021-04-30 DIAGNOSIS — N189 Chronic kidney disease, unspecified: Secondary | ICD-10-CM | POA: Diagnosis present

## 2021-04-30 LAB — CBC WITH DIFFERENTIAL/PLATELET
Abs Immature Granulocytes: 0.04 10*3/uL (ref 0.00–0.07)
Basophils Absolute: 0 10*3/uL (ref 0.0–0.1)
Basophils Relative: 1 %
Eosinophils Absolute: 0.1 10*3/uL (ref 0.0–0.5)
Eosinophils Relative: 1 %
HCT: 19.7 % — ABNORMAL LOW (ref 36.0–46.0)
Hemoglobin: 6.7 g/dL — CL (ref 12.0–15.0)
Immature Granulocytes: 1 %
Lymphocytes Relative: 21 %
Lymphs Abs: 1.3 10*3/uL (ref 0.7–4.0)
MCH: 29.9 pg (ref 26.0–34.0)
MCHC: 34 g/dL (ref 30.0–36.0)
MCV: 87.9 fL (ref 80.0–100.0)
Monocytes Absolute: 0.4 10*3/uL (ref 0.1–1.0)
Monocytes Relative: 6 %
Neutro Abs: 4.4 10*3/uL (ref 1.7–7.7)
Neutrophils Relative %: 70 %
Platelets: 481 10*3/uL — ABNORMAL HIGH (ref 150–400)
RBC: 2.24 MIL/uL — ABNORMAL LOW (ref 3.87–5.11)
RDW: 13.7 % (ref 11.5–15.5)
WBC: 6.2 10*3/uL (ref 4.0–10.5)
nRBC: 0 % (ref 0.0–0.2)

## 2021-04-30 LAB — CBG MONITORING, ED
Glucose-Capillary: 503 mg/dL (ref 70–99)
Glucose-Capillary: 423 mg/dL — ABNORMAL HIGH (ref 70–99)
Glucose-Capillary: 255 mg/dL — ABNORMAL HIGH (ref 70–99)
Glucose-Capillary: 206 mg/dL — ABNORMAL HIGH (ref 70–99)

## 2021-04-30 LAB — I-STAT BETA HCG BLOOD, ED (MC, WL, AP ONLY): I-stat hCG, quantitative: <5 m[IU]/mL (ref <5)

## 2021-04-30 LAB — I-STAT VENOUS BLOOD GAS, ED
Acid-Base Excess: 4 mmol/L — ABNORMAL HIGH (ref 0.0–2.0)
Bicarbonate: 27.6 mmol/L (ref 20.0–28.0)
Calcium, Ion: 1.03 mmol/L — ABNORMAL LOW (ref 1.15–1.40)
HCT: 19 % — ABNORMAL LOW (ref 36.0–46.0)
Hemoglobin: 6.5 g/dL — CL (ref 12.0–15.0)
O2 Saturation: 99 %
Potassium: 4 mmol/L (ref 3.5–5.1)
Sodium: 129 mmol/L — ABNORMAL LOW (ref 135–145)
TCO2: 29 mmol/L (ref 22–32)
pCO2, Ven: 36.6 mmHg — ABNORMAL LOW (ref 44.0–60.0)
pH, Ven: 7.486 — ABNORMAL HIGH (ref 7.250–7.430)
pO2, Ven: 139 mmHg — ABNORMAL HIGH (ref 32.0–45.0)

## 2021-04-30 LAB — HEPATIC FUNCTION PANEL
ALT: 30 U/L (ref 0–44)
AST: 29 U/L (ref 15–41)
Albumin: 2 g/dL — ABNORMAL LOW (ref 3.5–5.0)
Alkaline Phosphatase: 104 U/L (ref 38–126)
Bilirubin, Direct: 0.4 mg/dL — ABNORMAL HIGH (ref 0.0–0.2)
Indirect Bilirubin: 0.3 mg/dL (ref 0.3–0.9)
Total Bilirubin: 0.7 mg/dL (ref 0.3–1.2)
Total Protein: 4.8 g/dL — ABNORMAL LOW (ref 6.5–8.1)

## 2021-04-30 LAB — BETA-HYDROXYBUTYRIC ACID: Beta-Hydroxybutyric Acid: 0.1 mmol/L (ref 0.05–0.27)

## 2021-04-30 LAB — PREPARE RBC (CROSSMATCH)

## 2021-04-30 LAB — BASIC METABOLIC PANEL
Anion gap: 9 (ref 5–15)
BUN: 57 mg/dL — ABNORMAL HIGH (ref 6–20)
CO2: 24 mmol/L (ref 22–32)
Calcium: 7.6 mg/dL — ABNORMAL LOW (ref 8.9–10.3)
Chloride: 95 mmol/L — ABNORMAL LOW (ref 98–111)
Creatinine, Ser: 3.37 mg/dL — ABNORMAL HIGH (ref 0.44–1.00)
GFR, Estimated: 18 mL/min — ABNORMAL LOW (ref >60)
Glucose, Bld: 515 mg/dL (ref 70–99)
Potassium: 3.9 mmol/L (ref 3.5–5.1)
Sodium: 128 mmol/L — ABNORMAL LOW (ref 135–145)

## 2021-04-30 LAB — RESP PANEL BY RT-PCR (FLU A&B, COVID) ARPGX2
Influenza A by PCR: NEGATIVE
Influenza B by PCR: NEGATIVE
SARS Coronavirus 2 by RT PCR: NEGATIVE

## 2021-04-30 LAB — MAGNESIUM: Magnesium: 1.5 mg/dL — ABNORMAL LOW (ref 1.7–2.4)

## 2021-04-30 MED ORDER — ACETAMINOPHEN 650 MG RE SUPP: 650.0000 mg | Freq: Four times a day (QID) | RECTAL | Status: DC | PRN

## 2021-04-30 MED ORDER — INSULIN GLARGINE-YFGN 100 UNIT/ML ~~LOC~~ SOLN
4.0000 [IU] | Freq: Every day | SUBCUTANEOUS | Status: DC
  Administered 2021-05-01: 4 [IU] via SUBCUTANEOUS
  Filled 2021-04-30: qty 0.04

## 2021-04-30 MED ORDER — OXYCODONE HCL 5 MG PO TABS
5.0000 mg | ORAL_TABLET | ORAL | Status: DC | PRN
  Administered 2021-05-01: 5 mg via ORAL
  Filled 2021-04-30 (×2): qty 1

## 2021-04-30 MED ORDER — PANTOPRAZOLE SODIUM 40 MG PO TBEC
40.0000 mg | DELAYED_RELEASE_TABLET | Freq: Every day | ORAL | Status: DC
  Administered 2021-05-01: 40 mg via ORAL
  Filled 2021-04-30: qty 1

## 2021-04-30 MED ORDER — OXYCODONE-ACETAMINOPHEN 5-325 MG PO TABS
1.0000 | ORAL_TABLET | ORAL | Status: DC | PRN
  Administered 2021-05-01: 1 via ORAL
  Filled 2021-04-30 (×2): qty 1

## 2021-04-30 MED ORDER — FENTANYL CITRATE PF 50 MCG/ML IJ SOSY
50.0000 ug | PREFILLED_SYRINGE | Freq: Once | INTRAMUSCULAR | Status: AC
  Administered 2021-04-30: 50 ug via INTRAVENOUS
  Filled 2021-04-30: qty 1

## 2021-04-30 MED ORDER — FENTANYL CITRATE PF 50 MCG/ML IJ SOSY
25.0000 ug | PREFILLED_SYRINGE | INTRAMUSCULAR | Status: AC | PRN
  Administered 2021-05-01 (×3): 25 ug via INTRAVENOUS
  Filled 2021-04-30 (×3): qty 1

## 2021-04-30 MED ORDER — ACETAMINOPHEN 325 MG PO TABS: 650.0000 mg | ORAL_TABLET | Freq: Four times a day (QID) | ORAL | Status: DC | PRN

## 2021-04-30 MED ORDER — INSULIN ASPART 100 UNIT/ML IJ SOLN: 12.0000 [IU] | Freq: Once | INTRAMUSCULAR | Status: DC

## 2021-04-30 MED ORDER — INSULIN ASPART 100 UNIT/ML IJ SOLN
0.0000 [IU] | Freq: Three times a day (TID) | INTRAMUSCULAR | Status: DC
  Administered 2021-05-01: 3 [IU] via SUBCUTANEOUS
  Administered 2021-05-01: 1 [IU] via SUBCUTANEOUS

## 2021-04-30 MED ORDER — SODIUM CHLORIDE 0.9% FLUSH
3.0000 mL | Freq: Two times a day (BID) | INTRAVENOUS | Status: DC
  Administered 2021-04-30 – 2021-05-01 (×2): 3 mL via INTRAVENOUS

## 2021-04-30 MED ORDER — OXYCODONE-ACETAMINOPHEN 10-325 MG PO TABS: 1.0000 | ORAL_TABLET | ORAL | Status: DC | PRN

## 2021-04-30 MED ORDER — MAGNESIUM SULFATE 2 GM/50ML IV SOLN
2.0000 g | Freq: Once | INTRAVENOUS | Status: AC
  Administered 2021-04-30: 2 g via INTRAVENOUS
  Filled 2021-04-30: qty 50

## 2021-04-30 MED ORDER — TORSEMIDE 20 MG PO TABS
20.0000 mg | ORAL_TABLET | Freq: Two times a day (BID) | ORAL | Status: DC
  Administered 2021-04-30 – 2021-05-01 (×2): 20 mg via ORAL
  Filled 2021-04-30 (×2): qty 1

## 2021-04-30 MED ORDER — INSULIN ASPART 100 UNIT/ML IJ SOLN
0.0000 [IU] | Freq: Every day | INTRAMUSCULAR | Status: DC
  Administered 2021-04-30: 2 [IU] via SUBCUTANEOUS

## 2021-04-30 MED ORDER — SODIUM CHLORIDE 0.9 % IV SOLN
10.0000 mL/h | Freq: Once | INTRAVENOUS | Status: AC
  Administered 2021-04-30: 10 mL/h via INTRAVENOUS

## 2021-04-30 MED ORDER — FENTANYL CITRATE PF 50 MCG/ML IJ SOSY
25.0000 ug | PREFILLED_SYRINGE | INTRAMUSCULAR | Status: DC | PRN
  Administered 2021-04-30: 25 ug via INTRAVENOUS
  Filled 2021-04-30: qty 1

## 2021-04-30 MED ORDER — ROSUVASTATIN CALCIUM 20 MG PO TABS
20.0000 mg | ORAL_TABLET | Freq: Every day | ORAL | Status: DC
  Administered 2021-04-30: 20 mg via ORAL
  Filled 2021-04-30: qty 1

## 2021-04-30 MED ORDER — INSULIN ASPART 100 UNIT/ML IJ SOLN
12.0000 [IU] | Freq: Once | INTRAMUSCULAR | Status: AC
  Administered 2021-04-30: 12 [IU] via SUBCUTANEOUS

## 2021-04-30 MED ORDER — LABETALOL HCL 100 MG PO TABS
100.0000 mg | ORAL_TABLET | Freq: Two times a day (BID) | ORAL | Status: DC
  Administered 2021-05-01: 100 mg via ORAL
  Filled 2021-04-30: qty 1

## 2021-04-30 MED ORDER — POLYETHYLENE GLYCOL 3350 17 G PO PACK: 17.0000 g | PACK | Freq: Every day | ORAL | Status: DC | PRN

## 2021-04-30 MED ORDER — MAGNESIUM OXIDE -MG SUPPLEMENT 400 (240 MG) MG PO TABS
400.0000 mg | ORAL_TABLET | Freq: Two times a day (BID) | ORAL | Status: AC
  Administered 2021-04-30 – 2021-05-01 (×2): 400 mg via ORAL
  Filled 2021-04-30 (×2): qty 1

## 2021-04-30 NOTE — ED Triage Notes (Signed)
Arrives to ED via [facility]. SOB, edema abdomen and LLE/RLE Stage IV Kidney HGB 6.8 at office visit today CBG high 110HR 20ga LAC

## 2021-04-30 NOTE — Progress Notes (Signed)
Inpatient Diabetes Program Recommendations  AACE/ADA: New Consensus Statement on Inpatient Glycemic Control (2015)  Target Ranges:  Prepandial:   less than 140 mg/dL      Peak postprandial:   less than 180 mg/dL (1-2 hours)      Critically ill patients:  140 - 180 mg/dL   Lab Results  Component Value Date   GLUCAP 503 (HH) 04/30/2021   HGBA1C 6.9 (H) 04/11/2021    Review of Glycemic Control  Latest Reference Range & Units 04/30/21 14:41  Glucose-Capillary 70 - 99 mg/dL 503 (HH)   Diabetes history: Type 1 DM Outpatient Diabetes medications:  Lantus 5 units daily, 1 unit/30 grams of CHO ,and Adjust correction scale to the following when eating and when not eating:  If BG 200-250 - take 1 additional unit  If BG 251- 300- take 2 additional units  If BG 301 -350 - take 3 additional units  If BG 351 - 400 - take 4 additional units  If BG > 400 - take 4 units total--   Inpatient Diabetes Program Recommendations:    Labs pending.  Note patient is very sensitive to insulin doses.   Thanks,  Adah Perl, RN, BC-ADM Inpatient Diabetes Coordinator Pager [phone]  (8a-5p)

## 2021-04-30 NOTE — ED Notes (Signed)
Patient tolerates blood product well. No complaints. Rate changed to 296mL/hr.

## 2021-04-30 NOTE — Progress Notes (Signed)
New Admission Note:   Arrival Method: Arrived from [facility] ED via [place] and oriented x4 Telemetry: Box #1 Assessment: Completed Skin: Intact IV: NSL-Rt AC Pain: See docflowsheet Tubes: N/A Safety Measures: Safety Fall Prevention Plan has been discussed.  Admission: Completed 5MW Orientation: Patient has been oriented to the room, unit and staff.  Family:  Orders have been reviewed and implemented. Will continue to monitor the patient. Call light has been placed within reach and bed alarm has been activated.   Itai Barbian [employer], RN-BC Phone number: [phone]

## 2021-04-30 NOTE — ED Provider Notes (Signed)
I saw and evaluated the patient, reviewed the resident's note and I agree with the findings and plan.  EKG Interpretation  Date/Time:  Thursday April 30 2021 14:38:37 EST Ventricular Rate:  111 PR Interval:  126 QRS Duration: 77 QT Interval:  367 QTC Calculation: 499 R Axis:   64 Text Interpretation: Sinus tachycardia Borderline low voltage, extremity leads Prolonged QT interval Confirmed by Lacretia Leigh ([id number]) on 04/30/2021 4:67:59 PM   30 year old female here complaining of shortness of breath which began today.  Seen by her doctor today for similar symptoms.  Was found to be anemic here.  She denies any active blood loss from rectal or vaginal regions.  Has had some exertional dyspnea.  Denies any fever or cough.  Here she is hyperglycemic which she relates to not taking her insulin.  Denies any emesis.  She is also anemic here as well 2.  Chest x-ray with infiltrate versus atelectasis.  Plan will be at this time to treat her hyperglycemia with insulin, order a unit of packed red blood cells, and assess the need for admission      Lacretia Leigh, MD 04/30/21 1625

## 2021-04-30 NOTE — ED Notes (Signed)
Pt noted to complain numerous times about IV magnesium infusion. Infusion slowed to x2 by this RN with no relief in symptoms. IV patency continually checked and site asx. Infusion completely stopped by this RN and provider notified of the same.

## 2021-04-30 NOTE — ED Notes (Addendum)
Pt insists on CBG being taken early.

## 2021-04-30 NOTE — H&P (Addendum)
History and Physical   Ann Robertson:[id number] DOB: 06/02/1990 DOA: 04/30/2021  [facility]P: [facility] [place]   Patient coming from: PCP office  Chief Complaint: Shortness of breath  HPI: Ann Robertson is a 30 y.o. female with medical history significant of CKD 3A, anasarca, diabetes type 1, hypertension, neuropathy, prolonged QT, hyperlipidemia presenting with shortness of breath and abnormal labs at PCP office.  Patient has had shortness of breath for one day and presented to her PCP who she is also following with for CKD and chronic edema/anasarca.  Labs there showed hemoglobin of 6.8 and patient was sent to the ED for further evaluation and blood transfusion.  She denies dark or bloody stools.  She has been worked up for anemia outpatient previously with negative endoscopies.  She has chronic abdominal pain.  She denies fevers, chills, chest pain, constipation, diarrhea, nausea, vomiting.   ED Course: Vital signs in the ED significant for heart rate in the 100s to 110s and blood pressure in the 440N to 027O systolic.  Lab work-up showed BMP with sodium 128 which corrects considering glucose of 515, chloride 95, BUN 57, creatinine stable at 3.37, gap normal, calcium 7.6.  CBC showed hemoglobin of 6.7 similar to what was obtained at her PCP and down from her baseline of around 8.  Platelets noted to be 41.  Urinalysis pending.  Beta hydroxybutyric acid normal.  VBG performed showed pH 7.48 and PCO2 of 36.6.  Chest x-ray showed stable small left pleural effusion and increasing bilateral lower lobe atelectasis versus airspace disease.  Magnesium also obtained noted to be 1.5.  Patient received fentanyl insulin and a unit of blood was ordered in the ED.  Review of Systems: As per HPI otherwise all other systems reviewed and are negative.  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   Neuropathy    Preeclampsia    STD  (sexually transmitted disease)     Past Surgical History:  Procedure Laterality Date   CESAREAN SECTION     CESAREAN SECTION N/A 07/05/2013   Procedure: CESAREAN SECTION;  Surgeon: Emily Filbert, MD;  Location: [facility];  Service: Obstetrics;  Laterality: N/A;   CESAREAN SECTION N/A 07/25/2014   Procedure: CESAREAN SECTION;  Surgeon: Donnamae Jude, MD;  Location: [facility];  Service: Obstetrics;  Laterality: N/A;   CESAREAN SECTION N/A 11/21/2015   Procedure: CESAREAN SECTION;  Surgeon: Donnamae Jude, MD;  Location: [facility];  Service: Obstetrics;  Laterality: N/A;   WISDOM TOOTH EXTRACTION      Social History  reports that she has been smoking cigarettes. She has a 2.25 pack-year smoking history. She has never used smokeless tobacco. She reports current alcohol use. She reports that she does not use drugs.  Allergies  Allergen Reactions   Hydromorphone Hcl Itching and Rash    Family History  Problem Relation Age of Onset   Stroke Mother    Hypertension Mother    Heart disease Mother    Kidney disease Mother    Hyperlipidemia Father    Diabetes Father   Reviewed on admission  Prior to Admission medications   Medication Sig Start Date End Date Taking? Authorizing Provider  atropine 1 % ophthalmic ointment Place 1 application into both eyes daily.    [provider]  bisacodyl (DULCOLAX) 10 MG suppository Place 1 suppository (10 mg total) rectally daily as needed for moderate constipation. Patient  not taking: Reported on 04/18/2021 02/21/21   Terrilee Croak, MD  Continuous Blood Gluc Sensor (DEXCOM G6 SENSOR) MISC Apply topically. 03/12/21   [provider]  Continuous Blood Gluc Sensor (DEXCOM G6 SENSOR) MISC  03/12/21   [provider]  Continuous Blood Gluc Transmit (DEXCOM G6 TRANSMITTER) MISC  02/22/21   [provider]  enalapril (VASOTEC) 5 MG tablet Take 5 mg by mouth 2 (two) times daily. 03/18/21   [provider]  insulin  glargine (LANTUS SOLOSTAR) 100 UNIT/ML Solostar Pen Inject 5 Units into the skin daily. And pen needles 1/day Patient taking differently: Inject 2-5 Units into the skin daily as needed (for a high BGL). 02/02/21   Dwyane Dee, MD  insulin lispro (HUMALOG) 100 UNIT/ML KwikPen Inject 2-5 Units into the skin 3 (three) times daily as needed (for a high BGL). Per sliding scale    [provider]  Insulin Pen Needle (PEN NEEDLES) 30G X 8 MM MISC 1 each by Does not apply route daily. E11.9 05/14/19   Renato Shin, MD  labetalol (NORMODYNE) 100 MG tablet Take 100 mg by mouth 2 (two) times daily. 03/26/21   [provider]  methocarbamol (ROBAXIN) 750 MG tablet Take 750 mg by mouth every 8 (eight) hours as needed for muscle spasms. 03/16/21   [provider]  moxifloxacin (VIGAMOX) 0.5 % ophthalmic solution Place 1 drop into both eyes daily.    [provider]  neomycin-polymyxin-dexameth (MAXITROL) 0.1 % OINT Place 1 application into both eyes daily.    [provider]  omeprazole (PRILOSEC) 20 MG capsule Take 20 mg by mouth daily.    [provider]  ondansetron (ZOFRAN) 4 MG tablet Take 1 tablet (4 mg total) by mouth every 8 (eight) hours as needed for nausea or vomiting. Patient not taking: Reported on 04/18/2021 06/10/20   Renato Shin, MD  oxyCODONE-acetaminophen (PERCOCET) 10-325 MG tablet Take 1 tablet by mouth every 4 (four) hours as needed for pain. 02/13/21   [provider]  potassium chloride SA (KLOR-CON) 20 MEQ tablet Take 1 tablet (20 mEq total) by mouth 2 (two) times daily. 04/12/21   Patrecia Pour, MD  prednisoLONE acetate (PRED FORTE) 1 % ophthalmic suspension Place 1 drop into both eyes daily.    [provider]  rosuvastatin (CRESTOR) 20 MG tablet Take 20 mg by mouth at bedtime. 03/16/21   [provider]  torsemide (DEMADEX) 20 MG tablet Take 1 tablet (20 mg total) by mouth 2 (two) times daily. 02/21/21  05/22/21  Terrilee Croak, MD  Vitamin D, Ergocalciferol, (DRISDOL) 1.25 MG (50000 UNIT) CAPS capsule Take 50,000 Units by mouth every Tuesday. Patient not taking: Reported on 04/18/2021    [provider]    Physical Exam: Vitals:   04/30/21 1712 04/30/21 1727 04/30/21 1800 04/30/21 1830  BP: (!) 136/96 (!) 145/104 (!) 139/92 (!) 144/99  Pulse: (!) 108 (!) 107 (!) 115 (!) 113  Resp: 16 16 20 14   Temp: 98.2 F (36.8 C) 98.4 F (36.9 C)    TempSrc: Oral Oral    SpO2: 100% 100% 100% 100%  Weight:      Height:       Physical Exam Constitutional:      General: She is not in acute distress.    Appearance: Normal appearance.  HENT:     Head: Normocephalic and atraumatic.     Mouth/Throat:     Mouth: Mucous membranes are moist.     Pharynx:  Oropharynx is clear.  Eyes:     Extraocular Movements: Extraocular movements intact.     Pupils: Pupils are equal, round, and reactive to light.  Cardiovascular:     Rate and Rhythm: Regular rhythm. Tachycardia present.     Pulses: Normal pulses.     Heart sounds: Normal heart sounds.  Pulmonary:     Effort: Pulmonary effort is normal. No respiratory distress.     Breath sounds: Normal breath sounds.  Abdominal:     General: Bowel sounds are normal. There is no distension.     Palpations: Abdomen is soft.     Tenderness: There is no abdominal tenderness.  Musculoskeletal:        General: No swelling or deformity.     Right lower leg: Edema present.     Left lower leg: Edema present.  Skin:    General: Skin is warm and dry.  Neurological:     General: No focal deficit present.     Mental Status: Mental status is at baseline.   Labs on Admission: I have personally reviewed following labs and imaging studies  CBC: Recent Labs  Lab 04/30/21 1443 04/30/21 1500  WBC 6.2  --   NEUTROABS 4.4  --   HGB 6.7* 6.5*  HCT 19.7* 19.0*  MCV 87.9  --   PLT 481*  --     Basic Metabolic Panel: Recent Labs  Lab 04/30/21 1443  04/30/21 1500  NA 128* 129*  K 3.9 4.0  CL 95*  --   CO2 24  --   GLUCOSE 515*  --   BUN 57*  --   CREATININE 3.37*  --   CALCIUM 7.6*  --   MG 1.5*  --     GFR: Estimated Creatinine Clearance: 20.7 mL/min (A) (by C-G formula based on SCr of 3.37 mg/dL (H)).  Liver Function Tests: Recent Labs  Lab 04/30/21 1443  AST 29  ALT 30  ALKPHOS 104  BILITOT 0.7  PROT 4.8*  ALBUMIN 2.0*    Urine analysis:    Component Value Date/Time   COLORURINE STRAW (A) 04/19/2021 0544   APPEARANCEUR HAZY (A) 04/19/2021 0544   LABSPEC 1.014 04/19/2021 0544   PHURINE 5.0 04/19/2021 0544   GLUCOSEU >=500 (A) 04/19/2021 0544   HGBUR MODERATE (A) 04/19/2021 0544   BILIRUBINUR NEGATIVE 04/19/2021 0544   KETONESUR 20 (A) 04/19/2021 0544   PROTEINUR 100 (A) 04/19/2021 0544   UROBILINOGEN 0.2 11/06/2015 1011   NITRITE NEGATIVE 04/19/2021 0544   LEUKOCYTESUR NEGATIVE 04/19/2021 0544    Radiological Exams on Admission: DG Chest Portable 1 View  Result Date: 04/30/2021 CLINICAL DATA:  Shortness of breath. EXAM: PORTABLE CHEST 1 VIEW COMPARISON:  Chest x-ray 04/22/2021. FINDINGS: There is a stable small left pleural effusion. There are some bands of opacities in both lower lungs, increased from prior. There is no pneumothorax. The cardiomediastinal silhouette is within normal limits. IMPRESSION: 1. Stable small left pleural effusion. 2. Increasing bilateral lower lung atelectasis/airspace disease. Electronically Signed   By: Ronney Asters M.D.   On: 04/30/2021 15:22    EKG: Independently reviewed.  Sinus tachycardia 109 bpm.  Prolonged QTC at 499.  Low voltage in multiple leads.  Some nonspecific T wave flattening also noted.  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  Confirmed in the ED with hemoglobin 6.7. > Denies any dark or bloody stools.  Could be some component of dilution given her chronic CKD and anasarca.  Likely anemia due to CKD. > Previously has received transfusions and has been worked up outpatient reportedly with negative EGD and colonoscopy. > One-unit transfusion ordered in the ED. - Monitor on telemetry - Continue with 1 unit transfusion - Trend CBC - Add-on Iron studies  CKD 3A Anasarca Hypomagnesemia > Patient with known history of CKD 3.  Creatinine stable around 3.37. > Known anasarca for months being treated outpatient with torsemide dose recently increased from 20 mg to 4 mg daily.  Believed to been related to large volume of IV fluids during admission for DKA. > Noted to have magnesium of 1.5 in the ED. - Avoid nephrotoxic agents - Continue with home torsemide - Replete magnesium with 2 g IV - Trend renal function and electrolytes  Type 1 diabetes > 5 units daily and SSI at home. > Glucose elevated to 500 in the ED initially.  Received 12 units in the ED. > No gap in the ED normal bicarb normal beta hydroxybutyric acid. - We will continue with home 4 units daily. - SSI - We will give additional dose if glucose not coming down   Hypertension - Continue home amlodipine, enalapril  Neuropathy - Continue home gabapentin  Prolonged QT > QTC noted to be 499 in the ED. - Avoid QTC prolonging medications.  Chronic pain - Continue home oxycodone - As needed fentanyl for breakthrough pain x2 doses.  DVT prophylaxis: SCDs  Code Status:   Full  Family Communication:  Family updated at bedside Disposition Plan:   Patient is from:  Home  Anticipated DC to:  Home  Anticipated DC date:  1 to 2 days  Anticipated DC barriers: None  Consults called:  None  Admission status:  Observation, telemetry  Severity of  Illness: The appropriate patient status for this patient is OBSERVATION. Observation status is judged to be reasonable and necessary in order to provide the required intensity of service to ensure the patient's safety. The patient's presenting symptoms, physical exam findings, and initial radiographic and laboratory data in the context of their medical condition is felt to place them at decreased risk for further clinical deterioration. Furthermore, it is anticipated that the patient will be medically stable for discharge from the hospital within 2 midnights of admission.    Marcelyn Bruins MD [facility]  How to contact the [facility] Attending or Consulting provider [place] [facility] or covering provider during after hours [place], for this patient?   Check the care team in [facility] and look for a) attending/consulting [facility] provider listed and b) the [facility] team listed Log into [email or web address] and use [place] universal password to access. If you do not have the password, please contact the hospital operator. Locate the [facility] provider you are looking for under [facility] and page to a number that you can be directly reached. If you still have difficulty reaching the provider, please page the [facility] (Director on Call) for the Hospitalists listed on amion for assistance.  04/30/2021, 7:31 PM

## 2021-04-30 NOTE — ED Notes (Signed)
This RN attempted to administer 2 units of insulin to pt. Pt refused, stating she just received 12 units of insulin. This RN verified time of insulin administration and informed pt that since she has eaten sandwich provided here as well as sandwich "doordashed" from local sandwich shop, 2 units of insulin is indicated. Pt refused and told this RN she will talk to her doctor because "he knows she is sensitive to insulin". Refusal documented and medication not administered to pt.

## 2021-04-30 NOTE — ED Provider Notes (Signed)
[facility] EMERGENCY DEPARTMENT Provider Note   CSN: [id number] Arrival date & time: 04/30/21  1433     History Chief Complaint  Patient presents with   Shortness of Breath    Ann Robertson is a 30 y.o. female.  The history is provided by the patient and the EMS personnel.  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   Neuropathy    Preeclampsia    STD (sexually transmitted disease)     Patient Active Problem List   Diagnosis Date Noted   Symptomatic anemia 04/30/2021   Hypomagnesemia 04/30/2021   Hyperglycemia 04/30/2021   Chest pain 04/21/2021   Sore throat 04/21/2021   Lactic acid increased 04/20/2021   Bilateral fixed dilatation of pupils 04/19/2021   CKD (chronic kidney disease), stage III ([place]) 04/19/2021   Hyponatremia 04/19/2021   Acute urinary retention 04/19/2021   DKA, type 1 ([place]) 24/58/0998   Acute metabolic encephalopathy 33/82/5053   Hyperkalemia 04/18/2021   Sepsis ([place]) 04/18/2021   Prolonged QT interval 04/11/2021   Chronic pain 04/11/2021   Acute renal failure superimposed on stage 3 chronic kidney disease, unspecified acute renal failure type, unspecified whether stage 3a or 3b CKD ([place]) 04/10/2021   Anasarca 02/15/2021   Constipation 02/15/2021   Hypoalbuminemia 02/15/2021   Hypokalemia 02/15/2021   Diabetic nephropathy ([place]) 02/15/2021   Bacterial vaginosis 02/15/2021   Sinus tachycardia 02/15/2021   Generalized edema 01/31/2021    Hypertensive urgency 01/28/2021   DKA (diabetic ketoacidosis) ([place]) 01/26/2021   Acute renal failure superimposed on stage 3a chronic kidney disease ([place]) 01/26/2021   Anemia 01/26/2021   HTN (hypertension) 01/26/2021   Peripheral neuropathy 01/26/2021   Abdominal pain, acute, epigastric 10/30/2020   Abdominal pain 10/30/2020   Nausea 06/10/2020   Foot pain, right 04/05/2018   Postpartum care following cesarean delivery 11/21/2015   Preterm labor 11/16/2015   Noncompliance with diabetes treatment 11/15/2015   Traumatic injury during pregnancy in third trimester 11/14/2015   Maternal chronic hypertension 11/06/2015   Previous cesarean section complicating pregnancy 97/67/3419   Short interval between pregnancies complicating pregnancy, antepartum 07/14/2015   History of preterm delivery, currently pregnant 07/14/2015   History of pre-eclampsia in prior pregnancy, currently pregnant 07/14/2015   Supervision of high risk pregnancy, antepartum 07/14/2015   History of preterm delivery 09/06/2014   History of polyhydramnios 09/06/2014   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[place]) 12/06/2013   Community acquired pneumonia 05/19/2013    Past Surgical History:  Procedure Laterality Date   CESAREAN SECTION     CESAREAN SECTION N/A 07/05/2013   Procedure: CESAREAN SECTION;  Surgeon: Emily Filbert, MD;  Location: [facility];  Service: Obstetrics;  Laterality: N/A;   CESAREAN SECTION N/A 07/25/2014   Procedure: CESAREAN SECTION;  Surgeon: Donnamae Jude, MD;  Location: [facility];  Service: Obstetrics;  Laterality: N/A;   CESAREAN SECTION N/A 11/21/2015   Procedure:  CESAREAN SECTION;  Surgeon: Donnamae Jude, MD;  Location: [facility];  Service: Obstetrics;  Laterality: N/A;   WISDOM TOOTH EXTRACTION       OB History     Gravida  5   Para  4   Term  0   Preterm  4   AB  1   Living  4      SAB   1   IAB  0   Ectopic  0   Multiple  0   Live Births  4           Family History  Problem Relation Age of Onset   Stroke Mother    Hypertension Mother    Heart disease Mother    Kidney disease Mother    Hyperlipidemia Father    Diabetes Father     Social History   Tobacco Use   Smoking status: Every Day    Packs/day: 0.25    Years: 9.00    Pack years: 2.25    Types: Cigarettes   Smokeless tobacco: Never  Substance Use Topics   Alcohol use: Yes    Comment: occasional   Drug use: No    Home Medications Prior to Admission medications   Medication Sig Start Date End Date Taking? Authorizing Provider  bisacodyl (DULCOLAX) 10 MG suppository Place 1 suppository (10 mg total) rectally daily as needed for moderate constipation. 02/21/21  Yes Dahal, Marlowe Aschoff, MD  enalapril (VASOTEC) 5 MG tablet Take 5 mg by mouth 2 (two) times daily. 03/18/21  Yes [provider]  insulin glargine (LANTUS SOLOSTAR) 100 UNIT/ML Solostar Pen Inject 5 Units into the skin daily. And pen needles 1/day Patient taking differently: Inject 2-5 Units into the skin daily as needed (high BGL). Inject 2-5 units as needed for High glucose levels 02/02/21  Yes Dwyane Dee, MD  insulin lispro (HUMALOG) 100 UNIT/ML KwikPen Inject 2-5 Units into the skin 3 (three) times daily as needed (for a high BGL). Per sliding scale   Yes [provider]  labetalol (NORMODYNE) 100 MG tablet Take 100 mg by mouth 2 (two) times daily. 03/26/21  Yes [provider]  methocarbamol (ROBAXIN) 750 MG tablet Take 750 mg by mouth every 8 (eight) hours as needed for muscle spasms. 03/16/21  Yes [provider]  moxifloxacin (VIGAMOX) 0.5 % ophthalmic solution Place 1 drop into both eyes daily.   Yes [provider]  neomycin-polymyxin-dexameth (MAXITROL) 0.1 % OINT Place 1 application into both eyes daily.   Yes [provider]  omeprazole (PRILOSEC) 20 MG capsule Take 20 mg by  mouth daily.   Yes [provider]  ondansetron (ZOFRAN) 4 MG tablet Take 1 tablet (4 mg total) by mouth every 8 (eight) hours as needed for nausea or vomiting. 06/10/20  Yes Renato Shin, MD  oxyCODONE-acetaminophen (PERCOCET) 10-325 MG tablet Take 1 tablet by mouth every 4 (four) hours as needed for pain. 02/13/21  Yes [provider]  prednisoLONE acetate (PRED FORTE) 1 % ophthalmic suspension Place 1 drop into the left eye every 4 (four) hours. 04/23/21  Yes [provider]  rosuvastatin (CRESTOR) 20 MG tablet Take 20 mg by mouth at bedtime. 03/16/21  Yes [provider]  torsemide (DEMADEX) 20 MG tablet Take 1 tablet (20 mg total) by mouth 2 (two) times daily. Patient taking differently: Take 40 mg by mouth 2 (two) times daily. 02/21/21 05/22/21 Yes Dahal, Marlowe Aschoff, MD  atropine 1 % ophthalmic ointment Place 1  application into both eyes daily. Patient not taking: Reported on 04/30/2021    [provider]  Continuous Blood Gluc Sensor (DEXCOM G6 SENSOR) MISC Apply topically. 03/12/21   [provider]  Continuous Blood Gluc Sensor (DEXCOM G6 SENSOR) MISC  03/12/21   [provider]  Continuous Blood Gluc Transmit (DEXCOM G6 TRANSMITTER) MISC  02/22/21   [provider]  Insulin Pen Needle (PEN NEEDLES) 30G X 8 MM MISC 1 each by Does not apply route daily. E11.9 05/14/19   Renato Shin, MD  potassium chloride SA (KLOR-CON) 20 MEQ tablet Take 1 tablet (20 mEq total) by mouth 2 (two) times daily. Patient not taking: Reported on 04/30/2021 04/12/21   Patrecia Pour, MD  Vitamin D, Ergocalciferol, (DRISDOL) 1.25 MG (50000 UNIT) CAPS capsule Take 50,000 Units by mouth every Tuesday. Patient not taking: Reported on 04/18/2021    [provider]    Allergies    Hydromorphone hcl  Review of Systems   Review of Systems  Constitutional:  Positive for fatigue. Negative for chills and fever.  HENT:  Negative for ear pain and  sore throat.   Eyes:  Negative for pain and visual disturbance.  Respiratory:  Positive for shortness of breath. Negative for cough.   Cardiovascular:  Negative for chest pain and palpitations.  Gastrointestinal:  Negative for abdominal pain and vomiting.  Genitourinary:  Negative for dysuria and hematuria.  Musculoskeletal:  Negative for arthralgias and back pain.  Skin:  Negative for color change and rash.  Neurological:  Positive for weakness. Negative for seizures and syncope.  All other systems reviewed and are negative.  Physical Exam Updated Vital Signs BP (!) 146/95   Pulse (!) 111   Temp 98.3 F (36.8 C) (Oral)   Resp 18   Ht 5\' 4"  (1.626 m)   Wt 64.9 kg   SpO2 100%   BMI 24.55 kg/m   Physical Exam Vitals and nursing note reviewed.  Constitutional:      General: She is not in acute distress.    Appearance: Normal appearance. She is well-developed.  HENT:     Head: Normocephalic and atraumatic.     Right Ear: External ear normal.     Left Ear: External ear normal.     Nose: Nose normal. No congestion or rhinorrhea.     Mouth/Throat:     Mouth: Mucous membranes are moist.  Eyes:     Extraocular Movements: Extraocular movements intact.     Conjunctiva/sclera: Conjunctivae normal.     Pupils: Pupils are equal, round, and reactive to light.  Cardiovascular:     Rate and Rhythm: Regular rhythm. Tachycardia present.     Pulses: Normal pulses.     Heart sounds: No murmur heard. Pulmonary:     Effort: Pulmonary effort is normal. No respiratory distress.     Breath sounds: Normal breath sounds. No wheezing, rhonchi or rales.  Abdominal:     General: Abdomen is flat.     Palpations: Abdomen is soft.     Tenderness: There is no abdominal tenderness. There is no guarding or rebound.     Comments: Distention and pitting edema of the abdomen.  No fluid wave.  Musculoskeletal:        General: No swelling, tenderness or deformity.     Cervical back: Normal range of  motion and neck supple. No rigidity.     Right lower leg: Edema (2+ up to knee) present.     Left lower leg:  Edema (2+ up to knee) present.  Skin:    General: Skin is warm and dry.     Capillary Refill: Capillary refill takes less than 2 seconds.  Neurological:     General: No focal deficit present.     Mental Status: She is alert and oriented to person, place, and time.  Psychiatric:        Mood and Affect: Mood normal.    ED Results / Procedures / Treatments   Labs (all labs ordered are listed, but only abnormal results are displayed) Labs Reviewed  BASIC METABOLIC PANEL - Abnormal; Notable for the following components:      Result Value   Sodium 128 (*)    Chloride 95 (*)    Glucose, Bld 515 (*)    BUN 57 (*)    Creatinine, Ser 3.37 (*)    Calcium 7.6 (*)    GFR, Estimated 18 (*)    All other components within normal limits  CBC WITH DIFFERENTIAL/PLATELET - Abnormal; Notable for the following components:   RBC 2.24 (*)    Hemoglobin 6.7 (*)    HCT 19.7 (*)    Platelets 481 (*)    All other components within normal limits  MAGNESIUM - Abnormal; Notable for the following components:   Magnesium 1.5 (*)    All other components within normal limits  HEPATIC FUNCTION PANEL - Abnormal; Notable for the following components:   Total Protein 4.8 (*)    Albumin 2.0 (*)    Bilirubin, Direct 0.4 (*)    All other components within normal limits  CBG MONITORING, ED - Abnormal; Notable for the following components:   Glucose-Capillary 503 (*)    All other components within normal limits  I-STAT VENOUS BLOOD GAS, ED - Abnormal; Notable for the following components:   pH, Ven 7.486 (*)    pCO2, Ven 36.6 (*)    pO2, Ven 139.0 (*)    Acid-Base Excess 4.0 (*)    Sodium 129 (*)    Calcium, Ion 1.03 (*)    HCT 19.0 (*)    Hemoglobin 6.5 (*)    All other components within normal limits  CBG MONITORING, ED - Abnormal; Notable for the following components:   Glucose-Capillary 423 (*)     All other components within normal limits  CBG MONITORING, ED - Abnormal; Notable for the following components:   Glucose-Capillary 255 (*)    All other components within normal limits  RESP PANEL BY RT-PCR (FLU A&B, COVID) ARPGX2  BETA-HYDROXYBUTYRIC ACID  URINALYSIS, ROUTINE W REFLEX MICROSCOPIC  MAGNESIUM  COMPREHENSIVE METABOLIC PANEL  CBC  FERRITIN  IRON AND TIBC  RETICULOCYTES  I-STAT BETA HCG BLOOD, ED ([facility], [facility], [facility] ONLY)  TYPE AND SCREEN  PREPARE RBC (CROSSMATCH)    EKG EKG Interpretation  Date/Time:  Thursday April 30 2021 14:38:37 EST Ventricular Rate:  111 PR Interval:  126 QRS Duration: 77 QT Interval:  367 QTC Calculation: 499 R Axis:   64 Text Interpretation: Sinus tachycardia Borderline low voltage, extremity leads Prolonged QT interval Confirmed by Lacretia Leigh ([id number]) on 04/30/2021 4:23:08 PM  Radiology DG Chest Portable 1 View  Result Date: 04/30/2021 CLINICAL DATA:  Shortness of breath. EXAM: PORTABLE CHEST 1 VIEW COMPARISON:  Chest x-ray 04/22/2021. FINDINGS: There is a stable small left pleural effusion. There are some bands of opacities in both lower lungs, increased from prior. There is no pneumothorax. The cardiomediastinal silhouette is within normal limits. IMPRESSION: 1. Stable small left pleural effusion.  2. Increasing bilateral lower lung atelectasis/airspace disease. Electronically Signed   By: Ronney Asters M.D.   On: 04/30/2021 15:22    Procedures Procedures   Medications Ordered in ED Medications  torsemide (DEMADEX) tablet 20 mg (has no administration in time range)  rosuvastatin (CRESTOR) tablet 20 mg (has no administration in time range)  pantoprazole (PROTONIX) EC tablet 40 mg (has no administration in time range)  sodium chloride flush (NS) 0.9 % injection 3 mL (has no administration in time range)  magnesium sulfate IVPB 2 g 50 mL (has no administration in time range)  acetaminophen (TYLENOL) tablet 650 mg (has no  administration in time range)    Or  acetaminophen (TYLENOL) suppository 650 mg (has no administration in time range)  polyethylene glycol (MIRALAX / GLYCOLAX) packet 17 g (has no administration in time range)  insulin glargine-yfgn (SEMGLEE) injection 4 Units (has no administration in time range)  insulin aspart (novoLOG) injection 0-6 Units (has no administration in time range)  insulin aspart (novoLOG) injection 0-5 Units (has no administration in time range)  labetalol (NORMODYNE) tablet 100 mg (has no administration in time range)  fentaNYL (SUBLIMAZE) injection 25 mcg (has no administration in time range)  oxyCODONE-acetaminophen (PERCOCET/ROXICET) 5-325 MG per tablet 1 tablet (has no administration in time range)    And  oxyCODONE (Oxy IR/ROXICODONE) immediate release tablet 5 mg (has no administration in time range)  0.9 %  sodium chloride infusion (10 mL/hr Intravenous New Bag/Given 04/30/21 1645)  fentaNYL (SUBLIMAZE) injection 50 mcg (50 mcg Intravenous Given 04/30/21 1613)  insulin aspart (novoLOG) injection 12 Units (12 Units Subcutaneous Given 04/30/21 1615)    ED Course  I have reviewed the triage vital signs and the nursing notes.  Pertinent labs & imaging results that were available during my care of the patient were reviewed by me and considered in my medical decision making (see chart for details).    MDM Rules/Calculators/A&P                          30 year old female with history of type 1 diabetes and CKD presenting with abnormal labs.  Seen at her PCP office for ongoing shortness of breath and diffuse edema.  She was found to be anemic to 6.8 on outpatient labs via her PCP.  She was sent to the ED for a blood transfusion.  She was hyperglycemic to the 500s on arrival. No signs of DKA. She was given 12 u lispro and consented for 1 unit of PRBCs.  EGD/colonoscopy in the past year neg. No signs or symptoms of GI bleed.  She is mildly tachycardic, but hemodynamically  stable.  She does have diffuse lower extremity and abdominal wall edema.  Ongoing for several months.  Followed by PCP.  Torsemide dose doubled today in clinic from 20 to 40 mg.  Chest x-ray showed stable small left pleural effusion.  EKG showed no ischemic changes.  Low concern for heart failure exacerbation, but will admit for further monitoring post transfusion. Patient updated on plan. Medicine team contacted. Hand off given.    Final Clinical Impression(s) / ED Diagnoses Final diagnoses:  Dyspnea, unspecified type  Anemia, unspecified type    Rx / DC Orders ED Discharge Orders     None        Idamae Lusher, MD 04/30/21 2012    Lacretia Leigh, MD 05/04/21 1034

## 2021-05-01 DIAGNOSIS — D649 Anemia, unspecified: Secondary | ICD-10-CM | POA: Diagnosis not present

## 2021-05-01 LAB — FERRITIN: Ferritin: 485 ng/mL — ABNORMAL HIGH (ref 11–307)

## 2021-05-01 LAB — COMPREHENSIVE METABOLIC PANEL
ALT: 33 U/L (ref 0–44)
AST: 33 U/L (ref 15–41)
Albumin: 2.4 g/dL — ABNORMAL LOW (ref 3.5–5.0)
Alkaline Phosphatase: 126 U/L (ref 38–126)
Anion gap: 9 (ref 5–15)
BUN: 54 mg/dL — ABNORMAL HIGH (ref 6–20)
CO2: 27 mmol/L (ref 22–32)
Calcium: 8.4 mg/dL — ABNORMAL LOW (ref 8.9–10.3)
Chloride: 96 mmol/L — ABNORMAL LOW (ref 98–111)
Creatinine, Ser: 3.27 mg/dL — ABNORMAL HIGH (ref 0.44–1.00)
GFR, Estimated: 19 mL/min — ABNORMAL LOW (ref >60)
Glucose, Bld: 224 mg/dL — ABNORMAL HIGH (ref 70–99)
Potassium: 3.2 mmol/L — ABNORMAL LOW (ref 3.5–5.1)
Sodium: 132 mmol/L — ABNORMAL LOW (ref 135–145)
Total Bilirubin: 0.6 mg/dL (ref 0.3–1.2)
Total Protein: 5.8 g/dL — ABNORMAL LOW (ref 6.5–8.1)

## 2021-05-01 LAB — IRON AND TIBC
Iron: 83 ug/dL (ref 28–170)
Saturation Ratios: 33 % — ABNORMAL HIGH (ref 10.4–31.8)
TIBC: 248 ug/dL — ABNORMAL LOW (ref 250–450)
UIBC: 165 ug/dL

## 2021-05-01 LAB — URINALYSIS, MICROSCOPIC (REFLEX): Bacteria, UA: NONE SEEN

## 2021-05-01 LAB — URINALYSIS, ROUTINE W REFLEX MICROSCOPIC
Bilirubin Urine: NEGATIVE
Glucose, UA: >=500 mg/dL — AB
Ketones, ur: NEGATIVE mg/dL
Leukocytes,Ua: NEGATIVE
Nitrite: NEGATIVE
Protein, ur: >300 mg/dL — AB
Specific Gravity, Urine: 1.015 (ref 1.005–1.030)
pH: 6.5 (ref 5.0–8.0)

## 2021-05-01 LAB — BPAM RBC
Blood Product Expiration Date: 202212292359
ISSUE DATE / TIME: 202212011703
Unit Type and Rh: 5100

## 2021-05-01 LAB — CBC
HCT: 29.5 % — ABNORMAL LOW (ref 36.0–46.0)
Hemoglobin: 10.1 g/dL — ABNORMAL LOW (ref 12.0–15.0)
MCH: 29.3 pg (ref 26.0–34.0)
MCHC: 34.2 g/dL (ref 30.0–36.0)
MCV: 85.5 fL (ref 80.0–100.0)
Platelets: 518 10*3/uL — ABNORMAL HIGH (ref 150–400)
RBC: 3.45 MIL/uL — ABNORMAL LOW (ref 3.87–5.11)
RDW: 13.2 % (ref 11.5–15.5)
WBC: 9 10*3/uL (ref 4.0–10.5)
nRBC: 0 % (ref 0.0–0.2)

## 2021-05-01 LAB — MAGNESIUM: Magnesium: 1.8 mg/dL (ref 1.7–2.4)

## 2021-05-01 LAB — TYPE AND SCREEN
ABO/RH(D): O POS
Antibody Screen: NEGATIVE
Unit division: 0

## 2021-05-01 LAB — RETICULOCYTES
Immature Retic Fract: 20.8 % — ABNORMAL HIGH (ref 2.3–15.9)
RBC.: 3.3 MIL/uL — ABNORMAL LOW (ref 3.87–5.11)
Retic Count, Absolute: 87.1 10*3/uL (ref 19.0–186.0)
Retic Ct Pct: 2.6 % (ref 0.4–3.1)

## 2021-05-01 LAB — GLUCOSE, CAPILLARY
Glucose-Capillary: 188 mg/dL — ABNORMAL HIGH (ref 70–99)
Glucose-Capillary: 214 mg/dL — ABNORMAL HIGH (ref 70–99)
Glucose-Capillary: 277 mg/dL — ABNORMAL HIGH (ref 70–99)

## 2021-05-01 MED ORDER — LANTUS SOLOSTAR 100 UNIT/ML ~~LOC~~ SOPN
2.0000 [IU] | PEN_INJECTOR | Freq: Every day | SUBCUTANEOUS | Status: DC | PRN
Start: 2021-05-01 — End: 2022-02-11

## 2021-05-01 MED ORDER — POTASSIUM CHLORIDE CRYS ER 20 MEQ PO TBCR
40.0000 meq | EXTENDED_RELEASE_TABLET | ORAL | Status: AC
  Administered 2021-05-01: 40 meq via ORAL
  Administered 2021-05-01: 20 meq via ORAL
  Filled 2021-05-01 (×2): qty 2

## 2021-05-01 MED ORDER — POTASSIUM CHLORIDE CRYS ER 20 MEQ PO TBCR: 20.0000 meq | EXTENDED_RELEASE_TABLET | Freq: Two times a day (BID) | ORAL | 0 refills | Status: DC

## 2021-05-01 MED ORDER — TORSEMIDE 20 MG PO TABS: 40.0000 mg | ORAL_TABLET | Freq: Two times a day (BID) | ORAL | 2 refills | Status: DC

## 2021-05-01 NOTE — Discharge Summary (Signed)
Physician Discharge Summary  Ann Robertson VOZ:[id number] DOB: Oct 26, 1990 DOA: 04/30/2021  PCP: [facility]  Admit date: 04/30/2021 Discharge date: 05/01/2021  Admitted From: Home Disposition: Home  Recommendations for Outpatient Follow-up:  Follow up with PCP in 1 week with repeat CBC/BMP Outpatient follow-up with nephrology Follow up in ED if symptoms worsen or new appear   Home Health: No Equipment/Devices: None  Discharge Condition: Stable CODE STATUS: Full Diet recommendation: Heart healthy/carb modified; might need to be on fluid restriction given her renal function.  Patient adamant to begin regular diet.  Brief/Interim Summary: 30 y.o. female with medical history significant of CKD IV, anasarca, diabetes type 1, hypertension, neuropathy, prolonged QT, hyperlipidemia presented with shortness of breath and abnormal labs at PCP office.  She was found to have hemoglobin of 6.8 and outpatient.  On presentation, hemoglobin was 6.7.  She was transfused 1 unit of packed red cells.  Subsequently, hemoglobin  is 10.1 today.  Her renal function is stable.  She will be discharged home today with outpatient follow-up with PCP and nephrology.  Discharge Diagnoses:   Acute on chronic anemia of chronic disease -Possibly from renal failure.  No signs of overt bleeding.  Denies dark or bloody stools.  Has had prior transfusions and outpatient work-up with negative EGD and colonoscopy -Presented with hemoglobin of 6.7.  Status post 1 unit packed red cell transfusion.  Hemoglobin 10.1 today. -She will be discharged home today with outpatient follow-up with PCP and nephrology.  Chronic renal disease stage IV Anasarca -Renal function stable.  Nephrology recently increased her torsemide to 40 mg twice a day.  Continue the same dose on discharge.  Outpatient follow-up with nephrology.  Hypokalemia -Replace potassium and continue replacement as an outpatient.  Follow BMP as an  outpatient  Hypomagnesemia -Improved  Diabetes mellitus type 1 -Patient does not want to take carb modified diet and states that she covers herself with Lantus.  Continue home regimen.  Outpatient follow-up with PCP.  Neuropathy -Continue gabapentin  Chronic pain -Continue home pain regimen.  Outpatient follow-up with PCP  Hypertension -Continue home regimen     Discharge Instructions  Discharge Instructions     Diet general   Complete by: As directed    Increase activity slowly   Complete by: As directed       Allergies as of 05/01/2021       Reactions   Hydromorphone Hcl Itching, Rash        Medication List     STOP taking these medications    atropine 1 % ophthalmic ointment       TAKE these medications    bisacodyl 10 MG suppository Commonly known as: DULCOLAX Place 1 suppository (10 mg total) rectally daily as needed for moderate constipation.   Dexcom G6 Sensor Misc Apply topically.   Dexcom G6 Sensor Misc   Dexcom G6 Transmitter Misc   enalapril 5 MG tablet Commonly known as: VASOTEC Take 5 mg by mouth 2 (two) times daily.   insulin lispro 100 UNIT/ML KwikPen Commonly known as: HUMALOG Inject 2-5 Units into the skin 3 (three) times daily as needed (for a high BGL). Per sliding scale   labetalol 100 MG tablet Commonly known as: NORMODYNE Take 100 mg by mouth 2 (two) times daily.   Lantus SoloStar 100 UNIT/ML Solostar Pen Generic drug: insulin glargine Inject 2-5 Units into the skin daily as needed (high BGL). Inject 2-5 units as needed for High glucose levels   methocarbamol 750 MG  tablet Commonly known as: ROBAXIN Take 750 mg by mouth every 8 (eight) hours as needed for muscle spasms.   moxifloxacin 0.5 % ophthalmic solution Commonly known as: VIGAMOX Place 1 drop into both eyes daily.   neomycin-polymyxin-dexameth 0.1 % Oint Commonly known as: MAXITROL Place 1 application into both eyes daily.   omeprazole 20 MG  capsule Commonly known as: PRILOSEC Take 20 mg by mouth daily.   ondansetron 4 MG tablet Commonly known as: ZOFRAN Take 1 tablet (4 mg total) by mouth every 8 (eight) hours as needed for nausea or vomiting.   oxyCODONE-acetaminophen 10-325 MG tablet Commonly known as: PERCOCET Take 1 tablet by mouth every 4 (four) hours as needed for pain.   Pen Needles 30G X 8 MM Misc 1 each by Does not apply route daily. E11.9   potassium chloride SA 20 MEQ tablet Commonly known as: KLOR-CON M Take 1 tablet (20 mEq total) by mouth 2 (two) times daily.   prednisoLONE acetate 1 % ophthalmic suspension Commonly known as: PRED FORTE Place 1 drop into the left eye every 4 (four) hours.   rosuvastatin 20 MG tablet Commonly known as: CRESTOR Take 20 mg by mouth at bedtime.   torsemide 20 MG tablet Commonly known as: Demadex Take 2 tablets (40 mg total) by mouth 2 (two) times daily.   Vitamin D (Ergocalciferol) 1.25 MG (50000 UNIT) Caps capsule Commonly known as: DRISDOL Take 50,000 Units by mouth every Tuesday.        Follow-up [facility]. Schedule an appointment as soon as possible for a visit in 1 week(s).   Why: with repeat cbc/bmp Contact information: [place] [id number] [phone]         Madelon Lips, MD. Schedule an appointment as soon as possible for a visit in 1 week(s).   Specialty: Nephrology Contact information: [place] [address] [phone]                Allergies  Allergen Reactions   Hydromorphone Hcl Itching and Rash    Consultations: None   Procedures/Studies: CT ABDOMEN PELVIS WO CONTRAST  Result Date: 04/19/2021 CLINICAL DATA:  Abdominal pain, DKA EXAM: CT ABDOMEN AND PELVIS WITHOUT CONTRAST TECHNIQUE: Multidetector CT imaging of the abdomen and pelvis was performed following the standard protocol without IV contrast. Unenhanced CT was performed per clinician order. Lack  of IV contrast limits sensitivity and specificity, especially for evaluation of abdominal/pelvic solid viscera. COMPARISON:  02/20/2021 FINDINGS: Lower chest: There are bilateral pleural effusions, left greater than right. Dependent atelectasis is seen within the lower lobes. Hepatobiliary: No focal liver abnormality is seen. No gallstones, gallbladder wall thickening, or biliary dilatation. Pancreas: Limited unenhanced evaluation the pancreas demonstrates no focal abnormalities. Spleen: Normal in size without focal abnormality. Adrenals/Urinary Tract: No urinary tract calculi or obstructive uropathy. Bladder is decompressed with a Foley catheter. The adrenals are unremarkable. Stomach/Bowel: No bowel obstruction or ileus. Normal retrocecal appendix. No bowel wall thickening or inflammatory change. Moderate retained stool within the rectal vault. Vascular/Lymphatic: Mild atherosclerosis throughout the aorta and its branches. No pathologic adenopathy. Reproductive: Uterus and bilateral adnexa are unremarkable. Bilateral tubal ligation clips. Other: Small volume ascites throughout the abdomen and pelvis. There is diffuse mesenteric edema, as well as diffuse body wall edema compatible with anasarca. No free intraperitoneal gas. No abdominal wall hernia. Musculoskeletal: No acute or destructive bony lesions. Reconstructed images demonstrate no additional findings. IMPRESSION: 1. Diffuse anasarca, with  body wall edema, mesenteric edema, and small volume ascites. 2. Small bilateral pleural effusions, left greater than right. 3. Otherwise unremarkable limited unenhanced evaluation of the abdomen and pelvis. Electronically Signed   By: Randa Ngo M.D.   On: 04/19/2021 20:36   DG Chest 2 View  Result Date: 04/22/2021 CLINICAL DATA:  Follow-up pneumonia EXAM: CHEST - 2 VIEW COMPARISON:  04/21/2021 FINDINGS: Subsegmental atelectasis in the right lower lung. Small left effusion and basilar airspace disease without  change. Normal cardiomediastinal silhouette. IMPRESSION: No significant change in small left effusion and adjacent basilar airspace disease, probably pneumonia. Subsegmental atelectasis right lung base Electronically Signed   By: Donavan Foil M.D.   On: 04/22/2021 16:47   CT HEAD WO CONTRAST (5MM)  Result Date: 04/18/2021 CLINICAL DATA:  Altered mental status and hyperglycemia EXAM: CT HEAD WITHOUT CONTRAST TECHNIQUE: Contiguous axial images were obtained from the base of the skull through the vertex without intravenous contrast. COMPARISON:  None. FINDINGS: Brain: No evidence of acute infarction, hemorrhage, hydrocephalus, extra-axial collection or mass lesion/mass effect. Vascular: No hyperdense vessel or unexpected calcification. Skull: Normal. Negative for fracture or focal lesion. Sinuses/Orbits: No acute finding. Other: None. IMPRESSION: No acute intracranial abnormality noted. Electronically Signed   By: Inez Catalina M.D.   On: 04/18/2021 21:37   MR BRAIN WO CONTRAST  Result Date: 04/19/2021 CLINICAL DATA:  Neuro deficit, ICU patient EXAM: MRI HEAD WITHOUT CONTRAST TECHNIQUE: Multiplanar, multiecho pulse sequences of the brain and surrounding structures were obtained without intravenous contrast. COMPARISON:  Noncontrast CT head obtained earlier the same day FINDINGS: There is intermittent motion degradation, severe on some sequences including the coronal T2 and sagittal T1. Brain: There is no evidence of acute intracranial hemorrhage, extra-axial fluid collection, or acute infarct. Parenchymal signal is normal. Parenchymal volume is normal. The ventricles are not enlarged. There is no mass lesion.  There is no midline shift. Vascular: Normal flow voids. Skull and upper cervical spine: No definite marrow signal abnormality. Sinuses/Orbits: Postprocedural changes are again noted in the left globe. The imaged paranasal sinuses are clear. Other: None. IMPRESSION: No acute intracranial pathology,  within the confines of intermittent motion degradation as above. Electronically Signed   By: Valetta Mole M.D.   On: 04/19/2021 17:52   US RENAL  Result Date: 04/19/2021 CLINICAL DATA:  AK I EXAM: RENAL / URINARY TRACT ULTRASOUND COMPLETE COMPARISON:  CT abdomen pelvis 02/20/2021 FINDINGS: Right Kidney: Renal measurements: 9.5 x 4.8 x 5.1 cm = volume: 123 mL. Diffusely increased echogenicity. No mass or hydronephrosis visualized. Left Kidney: Renal measurements: 9.4 x 4.8 x 3.8 cm = volume: 87 mL. Diffusely increased echogenicity. No mass or hydronephrosis visualized. Bladder: Possible small amount of dependent debris. Other: None. IMPRESSION: 1. Limited exam secondary to patient clinical condition. Diffusely increased echogenicity of the bilateral kidneys as can be seen in medical renal disease. 2. Possible small amount of debris in the bladder. Correlate with urinalysis. Electronically Signed   By: Audie Pinto M.D.   On: 04/19/2021 11:04   DG Chest Portable 1 View  Result Date: 04/30/2021 CLINICAL DATA:  Shortness of breath. EXAM: PORTABLE CHEST 1 VIEW COMPARISON:  Chest x-ray 04/22/2021. FINDINGS: There is a stable small left pleural effusion. There are some bands of opacities in both lower lungs, increased from prior. There is no pneumothorax. The cardiomediastinal silhouette is within normal limits. IMPRESSION: 1. Stable small left pleural effusion. 2. Increasing bilateral lower lung atelectasis/airspace disease. Electronically Signed   By: Tina Griffiths.D.  On: 04/30/2021 15:22   DG CHEST PORT 1 VIEW  Result Date: 04/21/2021 CLINICAL DATA:  Chest pain EXAM: PORTABLE CHEST 1 VIEW COMPARISON:  04/20/2021 FINDINGS: Cardiac size is within normal limits. There is increased density in the left lower lung fields. Small transverse linear density is seen in the lateral right lower lung fields. Rest of the lung fields are unremarkable. There are no signs of pulmonary edema. There is blunting of  left lateral CP angle. There is no pneumothorax. IMPRESSION: There is interval increase in the opacity in the left lower lung fields. This suggests possible increase in the left pleural effusion and worsening of pneumonia. Small subsegmental atelectasis is seen in the right lower lung fields. Electronically Signed   By: Elmer Picker M.D.   On: 04/21/2021 13:01   DG CHEST PORT 1 VIEW  Result Date: 04/20/2021 CLINICAL DATA:  Confusion, abnormal chest x-ray. EXAM: PORTABLE CHEST 1 VIEW COMPARISON:  April 19, 2021. FINDINGS: The heart size and mediastinal contours are within normal limits. Mildly increased bibasilar opacities are noted concerning for atelectasis or possibly infiltrates. Minimal left pleural effusion may be present. The visualized skeletal structures are unremarkable. IMPRESSION: Mildly increased bibasilar opacities are noted concerning for atelectasis or possibly infiltrates. Minimal left pleural effusion may be present. Electronically Signed   By: Marijo Conception M.D.   On: 04/20/2021 08:39   DG CHEST PORT 1 VIEW  Result Date: 04/19/2021 CLINICAL DATA:  Hypothermia, altered level of consciousness EXAM: PORTABLE CHEST 1 VIEW COMPARISON:  01/26/2021 FINDINGS: Single frontal view of the chest demonstrates an unremarkable cardiac silhouette. Patchy areas of airspace disease are seen within the bilateral lower lobes, left greater than right. No effusion or pneumothorax. No acute bony abnormalities. IMPRESSION: 1. Patchy bibasilar airspace disease, consistent with multifocal pneumonia. Electronically Signed   By: Randa Ngo M.D.   On: 04/19/2021 16:33   CT HEAD CODE STROKE WO CONTRAST  Addendum Date: 04/19/2021   ADDENDUM REPORT: 04/19/2021 09:21 ADDENDUM: Study discussed by telephone with Dr. Hulen Luster JR on 04/19/2021 at 0905 hours. And he determined the patient had left-side ophthalmic surgery in September. Electronically Signed   By: Genevie Ann M.D.   On: 04/19/2021 09:21    Result Date: 04/19/2021 CLINICAL DATA:  Code stroke.  30 year old female with uneven pupils. EXAM: CT HEAD WITHOUT CONTRAST TECHNIQUE: Contiguous axial images were obtained from the base of the skull through the vertex without intravenous contrast. COMPARISON:  Head CT without contrast yesterday. FINDINGS: Brain: Intermittent motion artifact. Normal cerebral volume. No midline shift, ventriculomegaly, mass effect, evidence of mass lesion, intracranial hemorrhage or evidence of cortically based acute infarction. Gray-white matter differentiation is within normal limits throughout the brain. Vascular: Mild Calcified atherosclerosis at the skull base. No suspicious intracranial vascular hyperdensity. Skull: Motion artifact at the skull base. No acute osseous abnormality identified. Sinuses/Orbits: Visualized paranasal sinuses and mastoids are stable and well aerated. Other: Stable presumed postoperative changes to the left globe with gas within the vitreous. Negative visible scalp. ASPECTS [facility] Stroke Program Early CT Score) Total score (0-10 with 10 being normal): 10 IMPRESSION: 1. Stable and normal noncontrast CT appearance of the brain when allowing for motion artifact today. ASPECTS 10. 2. Stable presumed recent postoperative changes to the left globe with gas within the vitreous. Electronically Signed: By: Genevie Ann M.D. On: 04/19/2021 09:02      Subjective: Patient seen and examined at bedside.  She feels better and wants to go home today.  Denies  current nausea, vomiting, worsening shortness of breath. Discharge Exam: Vitals:   05/01/21 0409 05/01/21 0921  BP: (!) 162/105 (!) 165/109  Pulse: (!) 110 (!) 110  Resp: 16 18  Temp: 98.9 F (37.2 C) 98.1 F (36.7 C)  SpO2: 100% 100%    General: Pt is alert, awake, not in acute distress.  Currently on room air. Cardiovascular: rate controlled, S1/S2 + Respiratory: bilateral decreased breath sounds at bases Abdominal: Soft, NT, ND, bowel  sounds + Extremities: Trace lower extremity edema; no cyanosis    The results of significant diagnostics from this hospitalization (including imaging, microbiology, ancillary and laboratory) are listed below for reference.     Microbiology: Recent Results (from the past 240 hour(s))  Resp Panel by RT-PCR (Flu A&B, Covid) Nasopharyngeal Swab     Status: None   Collection Time: 04/30/21  8:02 PM   Specimen: Nasopharyngeal Swab; Nasopharyngeal(NP) swabs in vial transport medium  Result Value Ref Range Status   SARS Coronavirus 2 by RT PCR NEGATIVE NEGATIVE Final    Comment: (NOTE) SARS-CoV-2 target nucleic acids are NOT DETECTED.  The SARS-CoV-2 RNA is generally detectable in upper respiratory specimens during the acute phase of infection. The lowest concentration of SARS-CoV-2 viral copies this assay can detect is 138 copies/mL. A negative result does not preclude SARS-Cov-2 infection and should not be used as the sole basis for treatment or other patient management decisions. A negative result may occur with  improper specimen collection/handling, submission of specimen other than nasopharyngeal swab, presence of viral mutation(s) within the areas targeted by this assay, and inadequate number of viral copies(<138 copies/mL). A negative result must be combined with clinical observations, patient history, and epidemiological information. The expected result is Negative.  Fact Sheet for Patients:  EntrepreneurPulse.com.au  Fact Sheet for Healthcare Providers:  IncredibleEmployment.be  This test is no t yet approved or cleared by the [place] FDA and  has been authorized for detection and/or diagnosis of SARS-CoV-2 by FDA under an Emergency Use Authorization (EUA). This EUA will remain  in effect (meaning this test can be used) for the duration of the COVID-19 declaration under Section 564(b)(1) of the Act, 21 U.S.C.section 360bbb-3(b)(1),  unless the authorization is terminated  or revoked sooner.       Influenza A by PCR NEGATIVE NEGATIVE Final   Influenza B by PCR NEGATIVE NEGATIVE Final    Comment: (NOTE) The Xpert Xpress SARS-CoV-2/FLU/RSV plus assay is intended as an aid in the diagnosis of influenza from Nasopharyngeal swab specimens and should not be used as a sole basis for treatment. Nasal washings and aspirates are unacceptable for Xpert Xpress SARS-CoV-2/FLU/RSV testing.  Fact Sheet for Patients: EntrepreneurPulse.com.au  Fact Sheet for Healthcare Providers: IncredibleEmployment.be  This test is not yet approved or cleared by the [place] FDA and has been authorized for detection and/or diagnosis of SARS-CoV-2 by FDA under an Emergency Use Authorization (EUA). This EUA will remain in effect (meaning this test can be used) for the duration of the COVID-19 declaration under Section 564(b)(1) of the Act, 21 U.S.C. section 360bbb-3(b)(1), unless the authorization is terminated or revoked.  Performed at [facility], [place] [address], [place], [place] [address]      Labs: BNP (last 3 results) Recent Labs    02/14/21 2130  BNP 017.7*   Basic Metabolic Panel: Recent Labs  Lab 04/30/21 1443 04/30/21 1500 05/01/21 0044  NA 128* 129* 132*  K 3.9 4.0 3.2*  CL 95*  --  96*  CO2 24  --  27  GLUCOSE 515*  --  224*  BUN 57*  --  54*  CREATININE 3.37*  --  3.27*  CALCIUM 7.6*  --  8.4*  MG 1.5*  --  1.8   Liver Function Tests: Recent Labs  Lab 04/30/21 1443 05/01/21 0044  AST 29 33  ALT 30 33  ALKPHOS 104 126  BILITOT 0.7 0.6  PROT 4.8* 5.8*  ALBUMIN 2.0* 2.4*   No results for input(s): LIPASE, AMYLASE in the last 168 hours. No results for input(s): AMMONIA in the last 168 hours. CBC: Recent Labs  Lab 04/30/21 1443 04/30/21 1500 05/01/21 0044  WBC 6.2  --  9.0  NEUTROABS 4.4  --   --   HGB 6.7* 6.5* 10.1*  HCT 19.7* 19.0* 29.5*  MCV  87.9  --  85.5  PLT 481*  --  518*   Cardiac Enzymes: No results for input(s): CKTOTAL, CKMB, CKMBINDEX, TROPONINI in the last 168 hours. BNP: Invalid input(s): POCBNP CBG: Recent Labs  Lab 04/30/21 1659 04/30/21 1950 04/30/21 2254 05/01/21 0020 05/01/21 0640  GLUCAP 423* 255* 206* 214* 188*   D-Dimer No results for input(s): DDIMER in the last 72 hours. Hgb A1c No results for input(s): HGBA1C in the last 72 hours. Lipid Profile No results for input(s): CHOL, HDL, LDLCALC, TRIG, CHOLHDL, LDLDIRECT in the last 72 hours. Thyroid function studies No results for input(s): TSH, T4TOTAL, T3FREE, THYROIDAB in the last 72 hours.  Invalid input(s): FREET3 Anemia work up Recent Labs    05/01/21 0044  FERRITIN 485*  TIBC 248*  IRON 83  RETICCTPCT 2.6   Urinalysis    Component Value Date/Time   COLORURINE YELLOW 05/01/2021 0446   APPEARANCEUR CLEAR 05/01/2021 0446   LABSPEC 1.015 05/01/2021 0446   PHURINE 6.5 05/01/2021 0446   GLUCOSEU >=500 (A) 05/01/2021 0446   HGBUR SMALL (A) 05/01/2021 0446   BILIRUBINUR NEGATIVE 05/01/2021 0446   KETONESUR NEGATIVE 05/01/2021 0446   PROTEINUR >300 (A) 05/01/2021 0446   UROBILINOGEN 0.2 11/06/2015 1011   NITRITE NEGATIVE 05/01/2021 0446   LEUKOCYTESUR NEGATIVE 05/01/2021 0446   Sepsis Labs Invalid input(s): PROCALCITONIN,  WBC,  LACTICIDVEN Microbiology Recent Results (from the past 240 hour(s))  Resp Panel by RT-PCR (Flu A&B, Covid) Nasopharyngeal Swab     Status: None   Collection Time: 04/30/21  8:02 PM   Specimen: Nasopharyngeal Swab; Nasopharyngeal(NP) swabs in vial transport medium  Result Value Ref Range Status   SARS Coronavirus 2 by RT PCR NEGATIVE NEGATIVE Final    Comment: (NOTE) SARS-CoV-2 target nucleic acids are NOT DETECTED.  The SARS-CoV-2 RNA is generally detectable in upper respiratory specimens during the acute phase of infection. The lowest concentration of SARS-CoV-2 viral copies this assay can detect  is 138 copies/mL. A negative result does not preclude SARS-Cov-2 infection and should not be used as the sole basis for treatment or other patient management decisions. A negative result may occur with  improper specimen collection/handling, submission of specimen other than nasopharyngeal swab, presence of viral mutation(s) within the areas targeted by this assay, and inadequate number of viral copies(<138 copies/mL). A negative result must be combined with clinical observations, patient history, and epidemiological information. The expected result is Negative.  Fact Sheet for Patients:  EntrepreneurPulse.com.au  Fact Sheet for Healthcare Providers:  IncredibleEmployment.be  This test is no t yet approved or cleared by the [place] FDA and  has been authorized for detection and/or diagnosis of SARS-CoV-2  by FDA under an Emergency Use Authorization (EUA). This EUA will remain  in effect (meaning this test can be used) for the duration of the COVID-19 declaration under Section 564(b)(1) of the Act, 21 U.S.C.section 360bbb-3(b)(1), unless the authorization is terminated  or revoked sooner.       Influenza A by PCR NEGATIVE NEGATIVE Final   Influenza B by PCR NEGATIVE NEGATIVE Final    Comment: (NOTE) The Xpert Xpress SARS-CoV-2/FLU/RSV plus assay is intended as an aid in the diagnosis of influenza from Nasopharyngeal swab specimens and should not be used as a sole basis for treatment. Nasal washings and aspirates are unacceptable for Xpert Xpress SARS-CoV-2/FLU/RSV testing.  Fact Sheet for Patients: EntrepreneurPulse.com.au  Fact Sheet for Healthcare Providers: IncredibleEmployment.be  This test is not yet approved or cleared by the [place] FDA and has been authorized for detection and/or diagnosis of SARS-CoV-2 by FDA under an Emergency Use Authorization (EUA). This EUA will remain in effect  (meaning this test can be used) for the duration of the COVID-19 declaration under Section 564(b)(1) of the Act, 21 U.S.C. section 360bbb-3(b)(1), unless the authorization is terminated or revoked.  Performed at [facility], [place] [address], [place], [place] [address]      Time coordinating discharge: 35 minutes  SIGNED:   Aline August, MD  [facility] 05/01/2021, 10:28 AM

## 2021-05-01 NOTE — TOC Transition Note (Addendum)
Transition of Care [facility]) - CM/SW Discharge Note   Patient Details  Name: Ann Robertson MRN: [id number] Date of Birth: [birth year]  Transition of Care [facility]) CM/SW Contact:  Tom-Nease, Renea Ee, RN Phone Number: 05/01/2021, 11:03 AM   Clinical Narrative:    CM spoke with patient at bedside about needs for post hospital disposition as patient is scheduled for discharge today. Patient  has recent admissions. Present to the ED with Shortness of breath and abnormal labs from PCP office. Hgb was 6.8 at PCP and 6.7 in ED.1 unit PRBC given. Hgb today is 10.1. Patient states she lives at home with her four children. Her father is living and assists with care. Patient currently employed by [employer] but on medical leave at this time. Family transports to and from appointments in recent weeks due to her illness. Has a walker and a cane at home. Requesting home health aide and bedside commode as she states she is swollen and having difficulty ambulating. MD notified of request. PT/OT to eval and treat. PCP is Acey Lav, MD and uses [facility] on [address]. Has Medicaid Prepaid Health Plan. Father to transport at discharge. CM will follow with PT/OT recommendations.   12:40- PT/OT recommends home health PT/OT/Aide with rollator and bedside commode. CM called Tommi Rumps with [facility] [phone]) and referral made with acceptance voiced and also called and spoke with with Freda Munro with [employer] [phone]) and ordered rollator and bedside commode. Equipments will be delivered to patient's address as she could not wait for them to be delivered at the hospital. No further TOC needs noted.   Final next level of care: Home/Self Care Barriers to Discharge: Barriers Resolved   Patient Goals and CMS Choice Patient states their goals for this hospitalization and ongoing recovery are:: To go home to children. CMS Medicare.gov Compare Post Acute Care list provided to:: Patient    Discharge Placement                        Discharge Plan and Services                                     Social Determinants of Health (SDOH) Interventions     Readmission Risk Interventions No flowsheet data found.

## 2021-05-01 NOTE — Evaluation (Signed)
Physical Therapy Evaluation Patient Details Name: Ann Robertson MRN: [id number] DOB: March 02, 1991 Today's Date: 05/01/2021  History of Present Illness  Pt is a 30 y/o female admitted 12/1 secondary to SOB and edema. Found to have symptomatic anemia. PMH includes CKD, DM, and HTN.  Clinical Impression  Pt admitted secondary to problem above with deficits below. Pt with increased pain and swelling in BLE, and complaining of increased fatigue which limited mobility. Required min A for bed mobility and min guard A for transfers and gait within the room. Educated about use of rollator to help with activity tolerance at home and increase safety with mobility. Pt also wanting aide to help with increasing safety during ADL tasks. Will continue to follow acutely.        Recommendations for follow up therapy are one component of a multi-disciplinary discharge planning process, led by the attending physician.  Recommendations may be updated based on patient status, additional functional criteria and insurance authorization.  Follow Up Recommendations Home health PT [facility])    Assistance Recommended at Discharge Intermittent Supervision/Assistance  Functional Status Assessment Patient has had a recent decline in their functional status and demonstrates the ability to make significant improvements in function in a reasonable and predictable amount of time.  Equipment Recommendations  Rollator (4 wheels);BSC/3in1    Recommendations for Other Services       Precautions / Restrictions Precautions Precautions: Fall Restrictions Weight Bearing Restrictions: No      Mobility  Bed Mobility Overal bed mobility: Needs Assistance Bed Mobility: Supine to Sit     Supine to sit: Min assist     General bed mobility comments: Min A for BLE management.    Transfers Overall transfer level: Needs assistance Equipment used: Rolling walker (2 wheels) Transfers: Sit to/from Stand Sit to Stand: Min  guard           General transfer comment: Min guard for safety. Cues for hand placement.    Ambulation/Gait Ambulation/Gait assistance: Min guard Gait Distance (Feet): 25 Feet Assistive device: Rolling walker (2 wheels) Gait Pattern/deviations: Step-through pattern;Decreased stride length Gait velocity: Decreased     General Gait Details: Pt mildly shaky throughout. Min guard A for safety. Ambulated to/from bathroom, but refusing further mobility secondary to fatigue. Educated about using rollator at home to increase safety.  Stairs            Wheelchair Mobility    Modified Rankin (Stroke Patients Only)       Balance Overall balance assessment: Needs assistance Sitting-balance support: No upper extremity supported;Feet supported Sitting balance-Leahy Scale: Fair     Standing balance support: Bilateral upper extremity supported;No upper extremity supported Standing balance-Leahy Scale: Fair Standing balance comment: Able to maintain static standing without UE support, but require BUE for dynamic balance.                             Pertinent Vitals/Pain Pain Assessment: Faces Faces Pain Scale: Hurts little more Facial Expression: Relaxed, neutral Body Movements: Absence of movements Muscle Tension: Relaxed Compliance with ventilator (intubated pts.): N/A Vocalization (extubated pts.): Talking in normal tone or no sound CPOT Total: 0 Pain Location: BLE Pain Descriptors / Indicators: Grimacing;Guarding Pain Intervention(s): Limited activity within patient's tolerance;Monitored during session;Repositioned    Home Living Family/patient expects to be discharged to:: Private residence Living Arrangements: Children Available Help at Discharge: Family Type of Home: House Home Access: Ramped entrance       Home  Layout: One level Home Equipment: Conservation officer, nature (2 wheels);Cane - single point      Prior Function Prior Level of Function :  Independent/Modified Independent             Mobility Comments: Was using RW for mobility       Hand Dominance   Dominant Hand: Right    Extremity/Trunk Assessment   Upper Extremity Assessment Upper Extremity Assessment: Defer to OT evaluation    Lower Extremity Assessment Lower Extremity Assessment: RLE deficits/detail;LLE deficits/detail RLE Deficits / Details: Increased swelling throughout and pt reporting increased pain LLE Deficits / Details: Increased swelling throughout and pt reporting increased pain    Cervical / Trunk Assessment Cervical / Trunk Assessment: Normal  Communication   Communication: No difficulties  Cognition Arousal/Alertness: Awake/alert Behavior During Therapy: WFL for tasks assessed/performed Overall Cognitive Status: Within Functional Limits for tasks assessed                                          General Comments      Exercises     Assessment/Plan    PT Assessment Patient needs continued PT services  PT Problem List Decreased strength;Decreased activity tolerance;Decreased balance;Decreased mobility       PT Treatment Interventions DME instruction;Gait training;Functional mobility training;Therapeutic activities;Balance training;Therapeutic exercise;Patient/family education    PT Goals (Current goals can be found in the Care Plan section)  Acute Rehab PT Goals Patient Stated Goal: to go home PT Goal Formulation: With patient Time For Goal Achievement: 05/15/21 Potential to Achieve Goals: Good    Frequency Min 3X/week   Barriers to discharge        Co-evaluation PT/OT/SLP Co-Evaluation/Treatment: Yes Reason for Co-Treatment: For patient/therapist safety;To address functional/ADL transfers PT goals addressed during session: Mobility/safety with mobility;Balance;Proper use of DME         AM-PAC PT "6 Clicks" Mobility  Outcome Measure Help needed turning from your back to your side while in a flat  bed without using bedrails?: None Help needed moving from lying on your back to sitting on the side of a flat bed without using bedrails?: A Little Help needed moving to and from a bed to a chair (including a wheelchair)?: A Little Help needed standing up from a chair using your arms (e.g., wheelchair or bedside chair)?: A Little Help needed to walk in hospital room?: A Little Help needed climbing 3-5 steps with a railing? : A Little 6 Click Score: 19    End of Session   Activity Tolerance: Patient limited by fatigue Patient left: in chair;with call bell/phone within reach Nurse Communication: Mobility status PT Visit Diagnosis: Unsteadiness on feet (R26.81);Muscle weakness (generalized) (M62.81);Difficulty in walking, not elsewhere classified (R26.2)    Time: 5993-5701 PT Time Calculation (min) (ACUTE ONLY): 17 min   Charges:   PT Evaluation $PT Eval Low Complexity: 1 Low          Lou Miner, DPT  Acute Rehabilitation Services  Pager: [phone] Office: [phone]   Rudean Hitt 05/01/2021, 12:17 PM

## 2021-05-01 NOTE — Evaluation (Signed)
Occupational Therapy Evaluation Patient Details Name: Ann Robertson MRN: [id number] DOB: Oct 04, 1990 Today's Date: 05/01/2021   History of Present Illness Pt is a 30 y/o female admitted 12/1 secondary to SOB and edema. Found to have symptomatic anemia. PMH includes CKD, DM, and HTN.   Clinical Impression   Pt is typically independent in self care and walks with RW in the days leading up to admission. Pt presents with impaired standing balance, edematous and painful LEs. Pt needs set up to min guard assist and increased time for ADL. Pt to discharge today, recommending [place].      Recommendations for follow up therapy are one component of a multi-disciplinary discharge planning process, led by the attending physician.  Recommendations may be updated based on patient status, additional functional criteria and insurance authorization.   Follow Up Recommendations  Home health OT    Assistance Recommended at Discharge Intermittent Supervision/Assistance  Functional Status Assessment  Patient has had a recent decline in their functional status and demonstrates the ability to make significant improvements in function in a reasonable and predictable amount of time.  Equipment Recommendations  BSC/3in1    Recommendations for Other Services       Precautions / Restrictions Precautions Precautions: Fall Restrictions Weight Bearing Restrictions: No      Mobility Bed Mobility Overal bed mobility: Needs Assistance Bed Mobility: Supine to Sit     Supine to sit: Min assist     General bed mobility comments: Min A for BLE management.    Transfers Overall transfer level: Needs assistance Equipment used: Rolling walker (2 wheels) Transfers: Sit to/from Stand Sit to Stand: Min guard           General transfer comment: Min guard for safety. Cues for hand placement.      Balance Overall balance assessment: Needs assistance Sitting-balance support: No upper extremity  supported;Feet supported Sitting balance-Leahy Scale: Fair     Standing balance support: Bilateral upper extremity supported;No upper extremity supported Standing balance-Leahy Scale: Fair Standing balance comment: Able to maintain static standing without UE support, but require BUE for dynamic balance.                           ADL either performed or assessed with clinical judgement   ADL Overall ADL's : Needs assistance/impaired Eating/Feeding: Independent;Sitting   Grooming: Set up;Sitting;Wash/dry hands   Upper Body Bathing: Set up;Sitting   Lower Body Bathing: Set up;Sitting/lateral leans   Upper Body Dressing : Set up;Sitting   Lower Body Dressing: Set up;Sitting/lateral leans Lower Body Dressing Details (indicate cue type and reason): slippers Toilet Transfer: Min guard;Ambulation;BSC/3in1;Rolling walker (2 wheels)   Toileting- Clothing Manipulation and Hygiene: Min guard;Sit to/from stand       Functional mobility during ADLs: Min guard;Rolling walker (2 wheels)       Vision Ability to See in Adequate Light: 0 Adequate       Perception     Praxis      Pertinent Vitals/Pain Pain Assessment: Faces Faces Pain Scale: Hurts little more Facial Expression: Relaxed, neutral Body Movements: Absence of movements Muscle Tension: Relaxed Compliance with ventilator (intubated pts.): N/A Vocalization (extubated pts.): Talking in normal tone or no sound CPOT Total: 0 Pain Location: BLE Pain Descriptors / Indicators: Grimacing;Guarding Pain Intervention(s): Monitored during session;Repositioned     Hand Dominance Right   Extremity/Trunk Assessment Upper Extremity Assessment Upper Extremity Assessment: Defer to OT evaluation   Lower Extremity Assessment Lower Extremity Assessment:  Overall WFL for tasks assessed RLE Deficits / Details: Increased swelling throughout and pt reporting increased pain LLE Deficits / Details: Increased swelling throughout  and pt reporting increased pain   Cervical / Trunk Assessment Cervical / Trunk Assessment: Normal   Communication Communication Communication: No difficulties   Cognition Arousal/Alertness: Awake/alert Behavior During Therapy: WFL for tasks assessed/performed Overall Cognitive Status: Within Functional Limits for tasks assessed                                       General Comments       Exercises     Shoulder Instructions      Home Living Family/patient expects to be discharged to:: Private residence Living Arrangements: Children Available Help at Discharge: Family Type of Home: House Home Access: [place]: One level     Bathroom Shower/Tub: Teacher, early years/pre: [place]: Conservation officer, nature (2 wheels);Cane - single point          Prior Functioning/Environment Prior Level of Function : Independent/Modified Independent             Mobility Comments: Was using RW for mobility ADLs Comments: independent        OT Problem List: Impaired balance (sitting and/or standing);Pain      OT Treatment/Interventions:      OT Goals(Current goals can be found in the care plan section) Acute Rehab OT Goals OT Goal Formulation: With patient  OT Frequency:     Barriers to D/C:            Co-evaluation   Reason for Co-Treatment: For patient/therapist safety PT goals addressed during session: Mobility/safety with mobility;Balance;Proper use of DME        AM-PAC OT "6 Clicks" Daily Activity     Outcome Measure Help from another person eating meals?: None Help from another person taking care of personal grooming?: A Little Help from another person toileting, which includes using toliet, bedpan, or urinal?: A Little Help from another person bathing (including washing, rinsing, drying)?: A Little Help from another person to put on and taking off regular upper body clothing?: None Help from  another person to put on and taking off regular lower body clothing?: A Little 6 Click Score: 20   End of Session Equipment Utilized During Treatment: Rolling walker (2 wheels);Gait belt  Activity Tolerance: Patient tolerated treatment well Patient left: in chair;with call bell/phone within reach  OT Visit Diagnosis: Unsteadiness on feet (R26.81);Other abnormalities of gait and mobility (R26.89);Pain;Muscle weakness (generalized) (M62.81)                Time: 2119-4174 OT Time Calculation (min): 11 min Charges:  OT General Charges $OT Visit: 1 Visit OT Evaluation $OT Eval Low Complexity: [place], OTR/L Acute Rehabilitation Services Pager: [phone] Office: [phone]  Malka So 05/01/2021, 1:23 PM

## 2021-05-01 NOTE — Progress Notes (Signed)
NURSING PROGRESS NOTE  Ann Robertson [id number] Discharge Data: 05/01/2021 12:36 PM Attending Provider: Aline August, MD [id number], [facility]     Carroll Kinds to be D/C'd Home per MD order.  Discussed with the patient the After Visit Summary and all questions fully answered. All IV's discontinued with no bleeding noted. All belongings returned to patient for patient to take home.   Last Vital Signs:  Blood pressure (!) 165/109, pulse (!) 110, temperature 98.1 F (36.7 C), temperature source Oral, resp. rate 18, height 5\' 4"  (1.626 m), weight 64.9 kg, SpO2 100 %.  Discharge Medication List Allergies as of 05/01/2021       Reactions   Hydromorphone Hcl Itching, Rash        Medication List     STOP taking these medications    atropine 1 % ophthalmic ointment       TAKE these medications    bisacodyl 10 MG suppository Commonly known as: DULCOLAX Place 1 suppository (10 mg total) rectally daily as needed for moderate constipation.   Dexcom G6 Sensor Misc Apply topically.   Dexcom G6 Sensor Misc   Dexcom G6 Transmitter Misc   enalapril 5 MG tablet Commonly known as: VASOTEC Take 5 mg by mouth 2 (two) times daily.   insulin lispro 100 UNIT/ML KwikPen Commonly known as: HUMALOG Inject 2-5 Units into the skin 3 (three) times daily as needed (for a high BGL). Per sliding scale   labetalol 100 MG tablet Commonly known as: NORMODYNE Take 100 mg by mouth 2 (two) times daily.   Lantus SoloStar 100 UNIT/ML Solostar Pen Generic drug: insulin glargine Inject 2-5 Units into the skin daily as needed (high BGL). Inject 2-5 units as needed for High glucose levels   methocarbamol 750 MG tablet Commonly known as: ROBAXIN Take 750 mg by mouth every 8 (eight) hours as needed for muscle spasms.   moxifloxacin 0.5 % ophthalmic solution Commonly known as: VIGAMOX Place 1 drop into both eyes daily.   neomycin-polymyxin-dexameth 0.1 % Oint Commonly known as:  MAXITROL Place 1 application into both eyes daily.   omeprazole 20 MG capsule Commonly known as: PRILOSEC Take 20 mg by mouth daily.   ondansetron 4 MG tablet Commonly known as: ZOFRAN Take 1 tablet (4 mg total) by mouth every 8 (eight) hours as needed for nausea or vomiting.   oxyCODONE-acetaminophen 10-325 MG tablet Commonly known as: PERCOCET Take 1 tablet by mouth every 4 (four) hours as needed for pain.   Pen Needles 30G X 8 MM Misc 1 each by Does not apply route daily. E11.9   potassium chloride SA 20 MEQ tablet Commonly known as: KLOR-CON M Take 1 tablet (20 mEq total) by mouth 2 (two) times daily.   prednisoLONE acetate 1 % ophthalmic suspension Commonly known as: PRED FORTE Place 1 drop into the left eye every 4 (four) hours.   rosuvastatin 20 MG tablet Commonly known as: CRESTOR Take 20 mg by mouth at bedtime.   torsemide 20 MG tablet Commonly known as: Demadex Take 2 tablets (40 mg total) by mouth 2 (two) times daily.   Vitamin D (Ergocalciferol) 1.25 MG (50000 UNIT) Caps capsule Commonly known as: DRISDOL Take 50,000 Units by mouth every Tuesday.               Durable Medical Equipment  (From admission, onward)           Start     Ordered   05/01/21 1205  For home use only DME  4 wheeled rolling walker with seat  Once       Question:  Patient needs a walker to treat with the following condition  Answer:  Gait instability   05/01/21 1204   05/01/21 1204  For home use only DME 3 n 1  Once        05/01/21 1204

## 2021-05-05 ENCOUNTER — Other Ambulatory Visit: Payer: Self-pay | Admitting: Nephrology

## 2021-05-05 ENCOUNTER — Other Ambulatory Visit (HOSPITAL_COMMUNITY): Payer: Self-pay | Admitting: Nephrology

## 2021-05-05 DIAGNOSIS — R809 Proteinuria, unspecified: Secondary | ICD-10-CM

## 2021-05-11 ENCOUNTER — Inpatient Hospital Stay (HOSPITAL_COMMUNITY)
Admission: RE | Admit: 2021-05-11 | Discharge: 2021-05-11 | Disposition: A | Discharge status: Home / Self Care | Payer: Medicaid Other | Source: Ambulatory Visit | Attending: Nephrology | Admitting: Nephrology

## 2021-05-11 ENCOUNTER — Encounter (HOSPITAL_COMMUNITY): Payer: Self-pay

## 2021-05-18 ENCOUNTER — Other Ambulatory Visit (HOSPITAL_COMMUNITY): Payer: Self-pay | Admitting: Physician Assistant

## 2021-05-19 ENCOUNTER — Encounter (HOSPITAL_COMMUNITY): Payer: Self-pay

## 2021-05-19 ENCOUNTER — Ambulatory Visit (HOSPITAL_COMMUNITY): Admission: RE | Admit: 2021-05-19 | Payer: Medicaid Other | Source: Ambulatory Visit

## 2021-05-21 ENCOUNTER — Encounter (HOSPITAL_COMMUNITY): Payer: Self-pay

## 2021-05-27 ENCOUNTER — Other Ambulatory Visit (HOSPITAL_COMMUNITY): Payer: Self-pay | Admitting: *Deleted

## 2021-05-28 ENCOUNTER — Other Ambulatory Visit: Payer: Self-pay

## 2021-05-28 ENCOUNTER — Ambulatory Visit (HOSPITAL_COMMUNITY)
Admission: RE | Admit: 2021-05-28 | Discharge: 2021-05-28 | Disposition: A | Discharge status: Home / Self Care | Payer: Medicaid Other | Source: Ambulatory Visit | Attending: Nephrology | Admitting: Nephrology

## 2021-05-28 VITALS — BP 139/94 | HR 95

## 2021-05-28 DIAGNOSIS — N183 Chronic kidney disease, stage 3 unspecified: Secondary | ICD-10-CM | POA: Insufficient documentation

## 2021-05-28 LAB — POCT HEMOGLOBIN-HEMACUE: Hemoglobin: 7.6 g/dL — ABNORMAL LOW (ref 12.0–15.0)

## 2021-05-28 MED ORDER — EPOETIN ALFA 20000 UNIT/ML IJ SOLN
20000.0000 [IU] | INTRAMUSCULAR | Status: DC
  Administered 2021-05-28: 11:00:00 20000 [IU] via SUBCUTANEOUS

## 2021-05-28 MED ORDER — SODIUM CHLORIDE 0.9 % IV SOLN
510.0000 mg | INTRAVENOUS | Status: DC
  Administered 2021-05-28: 11:00:00 510 mg via INTRAVENOUS
  Filled 2021-05-28: qty 17

## 2021-05-28 MED ORDER — EPOETIN ALFA 20000 UNIT/ML IJ SOLN
INTRAMUSCULAR | Status: AC
  Filled 2021-05-28: qty 1

## 2021-06-03 ENCOUNTER — Other Ambulatory Visit: Payer: Self-pay

## 2021-06-03 DIAGNOSIS — N183 Chronic kidney disease, stage 3 unspecified: Secondary | ICD-10-CM

## 2021-06-04 ENCOUNTER — Other Ambulatory Visit: Payer: Self-pay

## 2021-06-04 ENCOUNTER — Encounter (HOSPITAL_COMMUNITY)
Admission: RE | Admit: 2021-06-04 | Discharge: 2021-06-04 | Disposition: A | Discharge status: Home / Self Care | Payer: Medicaid Other | Source: Ambulatory Visit | Attending: Nephrology | Admitting: Nephrology

## 2021-06-04 DIAGNOSIS — N189 Chronic kidney disease, unspecified: Secondary | ICD-10-CM | POA: Insufficient documentation

## 2021-06-04 DIAGNOSIS — N184 Chronic kidney disease, stage 4 (severe): Secondary | ICD-10-CM | POA: Insufficient documentation

## 2021-06-04 DIAGNOSIS — D631 Anemia in chronic kidney disease: Secondary | ICD-10-CM | POA: Diagnosis present

## 2021-06-04 MED ORDER — SODIUM CHLORIDE 0.9 % IV SOLN
510.0000 mg | INTRAVENOUS | Status: DC
  Administered 2021-06-04: 510 mg via INTRAVENOUS
  Filled 2021-06-04: qty 510

## 2021-06-08 ENCOUNTER — Encounter (HOSPITAL_COMMUNITY): Payer: Medicaid Other

## 2021-06-15 NOTE — Progress Notes (Deleted)
[facility] [place]  ASSESSMENT / PLAN: Ann Robertson is a 31 y.o. *** handed female in need of permanent hemodialysis access. I reviewed options for dialysis in detail with the patient. I counseled the patient that dialysis access requires surveillance and periodic maintenance. Plan to proceed with ***.    CHIEF COMPLAINT: ***  HISTORY OF PRESENT ILLNESS: Ann Robertson is a 30 y.o. female ***  VASCULAR SURGICAL HISTORY: ***  VASCULAR RISK FACTORS: {FINDINGS; POSITIVE NEGATIVE:4068323216} history of stroke / transient ischemic attack. {FINDINGS; POSITIVE NEGATIVE:4068323216} history of coronary artery disease. *** history of PCI. *** history of CABG.  {FINDINGS; POSITIVE NEGATIVE:4068323216} history of diabetes mellitus. Last A1c ***. {FINDINGS; POSITIVE NEGATIVE:4068323216} history of smoking. *** actively smoking. {FINDINGS; POSITIVE NEGATIVE:4068323216} history of hypertension. *** drug regimen with *** control. {FINDINGS; POSITIVE NEGATIVE:4068323216} history of chronic kidney disease.  Last GFR ***. CKD {stage:30421363}. {FINDINGS; POSITIVE NEGATIVE:4068323216} history of chronic obstructive pulmonary disease, treated with ***.  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   Neuropathy    Preeclampsia    STD (sexually transmitted disease)     Past Surgical History:  Procedure Laterality Date   CESAREAN SECTION     CESAREAN SECTION N/A 07/05/2013   Procedure: CESAREAN SECTION;  Surgeon: Emily Filbert, MD;  Location: [facility];  Service: Obstetrics;  Laterality: N/A;   CESAREAN SECTION N/A 07/25/2014   Procedure: CESAREAN SECTION;  Surgeon: Donnamae Jude, MD;  Location: [facility];  Service: Obstetrics;  Laterality: N/A;   CESAREAN SECTION N/A 11/21/2015   Procedure:  CESAREAN SECTION;  Surgeon: Donnamae Jude, MD;  Location: [facility];  Service: Obstetrics;  Laterality: N/A;   WISDOM TOOTH EXTRACTION      Family History  Problem Relation Age of Onset   Stroke Mother    Hypertension Mother    Heart disease Mother    Kidney disease Mother    Hyperlipidemia Father    Diabetes Father     Social History   Socioeconomic History   Marital status: Single    Spouse name: Not on file   Number of children: Not on file   Years of education: Not on file   Highest education level: Not on file  Occupational History   Not on file  Tobacco Use   Smoking status: Every Day    Packs/day: 0.25    Years: 9.00    Pack years: 2.25    Types: Cigarettes   Smokeless tobacco: Never  Vaping Use   Vaping Use: Not on file  Substance and Sexual Activity   Alcohol use: Yes    Comment: occasional   Drug use: No   Sexual activity: Not Currently    Birth control/protection: None  Other Topics Concern   Not on file  Social History Narrative   Not on file   Social Determinants of Health   Financial Resource Strain: Not on file  Food Insecurity: Not on file  Transportation Needs: Not on file  Physical Activity: Not on file  Stress: Not on file  Social Connections: Not on file  Intimate Partner Violence: Not on file    Allergies  Allergen Reactions   Hydromorphone Hcl Itching and Rash    Current Outpatient Medications  Medication Sig Dispense Refill   bisacodyl (DULCOLAX) 10 MG suppository Place 1 suppository (10 mg total)  rectally daily as needed for moderate constipation. 12 suppository 0   Continuous Blood Gluc Sensor (DEXCOM G6 SENSOR) MISC Apply topically.     Continuous Blood Gluc Sensor (DEXCOM G6 SENSOR) MISC      Continuous Blood Gluc Transmit (DEXCOM G6 TRANSMITTER) MISC      enalapril (VASOTEC) 5 MG tablet Take 5 mg by mouth 2 (two) times daily.     insulin glargine (LANTUS SOLOSTAR) 100 UNIT/ML Solostar Pen Inject 2-5 Units into the  skin daily as needed (high BGL). Inject 2-5 units as needed for High glucose levels     insulin lispro (HUMALOG) 100 UNIT/ML KwikPen Inject 2-5 Units into the skin 3 (three) times daily as needed (for a high BGL). Per sliding scale     Insulin Pen Needle (PEN NEEDLES) 30G X 8 MM MISC 1 each by Does not apply route daily. E11.9 90 each 0   labetalol (NORMODYNE) 100 MG tablet Take 100 mg by mouth 2 (two) times daily.     methocarbamol (ROBAXIN) 750 MG tablet Take 750 mg by mouth every 8 (eight) hours as needed for muscle spasms.     moxifloxacin (VIGAMOX) 0.5 % ophthalmic solution Place 1 drop into both eyes daily.     neomycin-polymyxin-dexameth (MAXITROL) 0.1 % OINT Place 1 application into both eyes daily.     omeprazole (PRILOSEC) 20 MG capsule Take 20 mg by mouth daily.     ondansetron (ZOFRAN) 4 MG tablet Take 1 tablet (4 mg total) by mouth every 8 (eight) hours as needed for nausea or vomiting. 20 tablet 0   oxyCODONE-acetaminophen (PERCOCET) 10-325 MG tablet Take 1 tablet by mouth every 4 (four) hours as needed for pain.     potassium chloride SA (KLOR-CON M) 20 MEQ tablet Take 1 tablet (20 mEq total) by mouth 2 (two) times daily. 30 tablet 0   prednisoLONE acetate (PRED FORTE) 1 % ophthalmic suspension Place 1 drop into the left eye every 4 (four) hours.     rosuvastatin (CRESTOR) 20 MG tablet Take 20 mg by mouth at bedtime.     torsemide (DEMADEX) 20 MG tablet Take 2 tablets (40 mg total) by mouth 2 (two) times daily. 120 tablet 2   Vitamin D, Ergocalciferol, (DRISDOL) 1.25 MG (50000 UNIT) CAPS capsule Take 50,000 Units by mouth every Tuesday. (Patient not taking: Reported on 04/18/2021)     No current facility-administered medications for this visit.    REVIEW OF SYSTEMS:  [X]  denotes positive finding, [ ]  denotes negative finding Cardiac  Comments:  Chest pain or chest pressure: ***   Shortness of breath upon exertion:    Short of breath when lying flat:    Irregular heart rhythm:         Vascular    Pain in calf, thigh, or hip brought on by ambulation:    Pain in feet at night that wakes you up from your sleep:     Blood clot in your veins:    Leg swelling:         Pulmonary    Oxygen at home:    Productive cough:     Wheezing:         Neurologic    Sudden weakness in arms or legs:     Sudden numbness in arms or legs:     Sudden onset of difficulty speaking or slurred speech:    Temporary loss of vision in one eye:     Problems with dizziness:  Gastrointestinal    Blood in stool:     Vomited blood:         Genitourinary    Burning when urinating:     Blood in urine:        Psychiatric    Major depression:         Hematologic    Bleeding problems:    Problems with blood clotting too easily:        Skin    Rashes or ulcers:        Constitutional    Fever or chills:      PHYSICAL EXAM There were no vitals filed for this visit.  Constitutional: *** appearing. *** distress. Appears *** nourished.  Neurologic: CN ***. *** focal findings. *** sensory loss. Psychiatric: *** Mood and affect symmetric and appropriate. Eyes: *** No icterus. No conjunctival pallor. Ears, nose, throat: *** mucous membranes moist. Midline trachea.  Cardiac: *** rate and rhythm.  Respiratory: *** unlabored. Abdominal: *** soft, non-tender, non-distended.  Peripheral vascular: *** Extremity: *** edema. *** cyanosis. *** pallor.  Skin: *** gangrene. *** ulceration.  Lymphatic: *** Stemmer's sign. *** palpable lymphadenopathy.  PERTINENT LABORATORY AND RADIOLOGIC DATA  Most recent CBC CBC Latest Ref Rng & Units 05/28/2021 05/01/2021 04/30/2021  WBC 4.0 - 10.5 K/uL - 9.0 -  Hemoglobin 12.0 - 15.0 g/dL 7.6(L) 10.1(L) 6.5(LL)  Hematocrit 36.0 - 46.0 % - 29.5(L) 19.0(L)  Platelets 150 - 400 K/uL - 518(H) -     Most recent CMP CMP Latest Ref Rng & Units 05/01/2021 04/30/2021 04/30/2021  Glucose 70 - 99 mg/dL 224(H) - 515(HH)  BUN 6 - 20 mg/dL 54(H) - 57(H)   Creatinine 0.44 - 1.00 mg/dL 3.27(H) - 3.37(H)  Sodium 135 - 145 mmol/L 132(L) 129(L) 128(L)  Potassium 3.5 - 5.1 mmol/L 3.2(L) 4.0 3.9  Chloride 98 - 111 mmol/L 96(L) - 95(L)  CO2 22 - 32 mmol/L 27 - 24  Calcium 8.9 - 10.3 mg/dL 8.4(L) - 7.6(L)  Total Protein 6.5 - 8.1 g/dL 5.8(L) - 4.8(L)  Total Bilirubin 0.3 - 1.2 mg/dL 0.6 - 0.7  Alkaline Phos 38 - 126 U/L 126 - 104  AST 15 - 41 U/L 33 - 29  ALT 0 - 44 U/L 33 - 30    Renal function CrCl cannot be calculated (Patient's most recent lab result is older than the maximum 21 days allowed.).  HbA1c POC (<> result, manual entry) (%)  Date Value  09/10/2020 >15   Hgb A1c MFr Bld (%)  Date Value  04/11/2021 6.9 (H)    No results found for: LDLCALC, LDLC, HIRISKLDL, POCLDL, LDLDIRECT, REALLDLC, TOTLDLC   Vascular Imaging: ***  Ann Robertson N. Stanford Breed, MD [facility] Phone Number: [phone] 06/15/2021 8:04 PM  Total time spent on preparing this encounter including chart review, data review, collecting history, examining the patient, coordinating care for this {tnhtimebilling:26202}  Portions of this report may have been transcribed using voice recognition software.  Every effort has been made to ensure accuracy; however, inadvertent computerized transcription errors may still be present.

## 2021-06-16 ENCOUNTER — Ambulatory Visit (HOSPITAL_COMMUNITY): Payer: Medicaid Other

## 2021-06-16 ENCOUNTER — Encounter: Payer: Medicaid Other | Admitting: Vascular Surgery

## 2021-06-16 ENCOUNTER — Emergency Department (HOSPITAL_COMMUNITY): Payer: Medicaid Other

## 2021-06-16 ENCOUNTER — Encounter (HOSPITAL_COMMUNITY): Payer: Self-pay

## 2021-06-16 ENCOUNTER — Ambulatory Visit (HOSPITAL_COMMUNITY)
Admission: RE | Admit: 2021-06-16 | Discharge: 2021-06-16 | Disposition: A | Discharge status: Home / Self Care | Payer: Medicaid Other | Source: Ambulatory Visit | Attending: Vascular Surgery | Admitting: Vascular Surgery

## 2021-06-16 ENCOUNTER — Other Ambulatory Visit: Payer: Self-pay

## 2021-06-16 ENCOUNTER — Inpatient Hospital Stay (HOSPITAL_COMMUNITY)
Admission: EM | Admit: 2021-06-16 | Discharge: 2021-06-20 | DRG: 628 | Disposition: A | Discharge status: Home / Self Care | Payer: Medicaid Other | Source: Ambulatory Visit | Attending: Internal Medicine | Admitting: Internal Medicine

## 2021-06-16 DIAGNOSIS — J9601 Acute respiratory failure with hypoxia: Secondary | ICD-10-CM | POA: Diagnosis not present

## 2021-06-16 DIAGNOSIS — J811 Chronic pulmonary edema: Secondary | ICD-10-CM | POA: Diagnosis present

## 2021-06-16 DIAGNOSIS — E103599 Type 1 diabetes mellitus with proliferative diabetic retinopathy without macular edema, unspecified eye: Secondary | ICD-10-CM | POA: Diagnosis present

## 2021-06-16 DIAGNOSIS — E8770 Fluid overload, unspecified: Principal | ICD-10-CM | POA: Diagnosis present

## 2021-06-16 DIAGNOSIS — E1022 Type 1 diabetes mellitus with diabetic chronic kidney disease: Secondary | ICD-10-CM | POA: Diagnosis present

## 2021-06-16 DIAGNOSIS — I10 Essential (primary) hypertension: Secondary | ICD-10-CM | POA: Diagnosis present

## 2021-06-16 DIAGNOSIS — Z83438 Family history of other disorder of lipoprotein metabolism and other lipidemia: Secondary | ICD-10-CM

## 2021-06-16 DIAGNOSIS — E10649 Type 1 diabetes mellitus with hypoglycemia without coma: Secondary | ICD-10-CM | POA: Diagnosis present

## 2021-06-16 DIAGNOSIS — Z79899 Other long term (current) drug therapy: Secondary | ICD-10-CM

## 2021-06-16 DIAGNOSIS — Z992 Dependence on renal dialysis: Secondary | ICD-10-CM

## 2021-06-16 DIAGNOSIS — N189 Chronic kidney disease, unspecified: Secondary | ICD-10-CM

## 2021-06-16 DIAGNOSIS — D631 Anemia in chronic kidney disease: Secondary | ICD-10-CM

## 2021-06-16 DIAGNOSIS — N179 Acute kidney failure, unspecified: Secondary | ICD-10-CM | POA: Diagnosis present

## 2021-06-16 DIAGNOSIS — N186 End stage renal disease: Secondary | ICD-10-CM | POA: Diagnosis not present

## 2021-06-16 DIAGNOSIS — E7800 Pure hypercholesterolemia, unspecified: Secondary | ICD-10-CM | POA: Diagnosis present

## 2021-06-16 DIAGNOSIS — Z841 Family history of disorders of kidney and ureter: Secondary | ICD-10-CM

## 2021-06-16 DIAGNOSIS — Z833 Family history of diabetes mellitus: Secondary | ICD-10-CM

## 2021-06-16 DIAGNOSIS — R7989 Other specified abnormal findings of blood chemistry: Secondary | ICD-10-CM | POA: Diagnosis present

## 2021-06-16 DIAGNOSIS — I120 Hypertensive chronic kidney disease with stage 5 chronic kidney disease or end stage renal disease: Secondary | ICD-10-CM | POA: Diagnosis not present

## 2021-06-16 DIAGNOSIS — E1040 Type 1 diabetes mellitus with diabetic neuropathy, unspecified: Secondary | ICD-10-CM | POA: Diagnosis present

## 2021-06-16 DIAGNOSIS — E1010 Type 1 diabetes mellitus with ketoacidosis without coma: Secondary | ICD-10-CM | POA: Diagnosis present

## 2021-06-16 DIAGNOSIS — Z8249 Family history of ischemic heart disease and other diseases of the circulatory system: Secondary | ICD-10-CM

## 2021-06-16 DIAGNOSIS — E1021 Type 1 diabetes mellitus with diabetic nephropathy: Secondary | ICD-10-CM | POA: Diagnosis present

## 2021-06-16 DIAGNOSIS — E1042 Type 1 diabetes mellitus with diabetic polyneuropathy: Secondary | ICD-10-CM

## 2021-06-16 DIAGNOSIS — Z8619 Personal history of other infectious and parasitic diseases: Secondary | ICD-10-CM

## 2021-06-16 DIAGNOSIS — Z794 Long term (current) use of insulin: Secondary | ICD-10-CM

## 2021-06-16 DIAGNOSIS — N19 Unspecified kidney failure: Secondary | ICD-10-CM

## 2021-06-16 DIAGNOSIS — E875 Hyperkalemia: Secondary | ICD-10-CM | POA: Diagnosis present

## 2021-06-16 DIAGNOSIS — E785 Hyperlipidemia, unspecified: Secondary | ICD-10-CM

## 2021-06-16 DIAGNOSIS — N2581 Secondary hyperparathyroidism of renal origin: Secondary | ICD-10-CM | POA: Diagnosis present

## 2021-06-16 DIAGNOSIS — N183 Chronic kidney disease, stage 3 unspecified: Secondary | ICD-10-CM

## 2021-06-16 DIAGNOSIS — Z20822 Contact with and (suspected) exposure to covid-19: Secondary | ICD-10-CM | POA: Diagnosis present

## 2021-06-16 DIAGNOSIS — Z885 Allergy status to narcotic agent status: Secondary | ICD-10-CM

## 2021-06-16 DIAGNOSIS — J90 Pleural effusion, not elsewhere classified: Secondary | ICD-10-CM | POA: Diagnosis present

## 2021-06-16 DIAGNOSIS — F17210 Nicotine dependence, cigarettes, uncomplicated: Secondary | ICD-10-CM | POA: Diagnosis present

## 2021-06-16 HISTORY — PX: IR FLUORO GUIDE CV LINE RIGHT: IMG2283

## 2021-06-16 HISTORY — PX: IR US GUIDE VASC ACCESS RIGHT: IMG2390

## 2021-06-16 LAB — COMPREHENSIVE METABOLIC PANEL
ALT: 22 U/L (ref 0–44)
AST: 20 U/L (ref 15–41)
Albumin: 2 g/dL — ABNORMAL LOW (ref 3.5–5.0)
Alkaline Phosphatase: 124 U/L (ref 38–126)
Anion gap: 16 — ABNORMAL HIGH (ref 5–15)
BUN: 61 mg/dL — ABNORMAL HIGH (ref 6–20)
CO2: 20 mmol/L — ABNORMAL LOW (ref 22–32)
Calcium: 8 mg/dL — ABNORMAL LOW (ref 8.9–10.3)
Chloride: 101 mmol/L (ref 98–111)
Creatinine, Ser: 5.28 mg/dL — ABNORMAL HIGH (ref 0.44–1.00)
GFR, Estimated: 10 mL/min — ABNORMAL LOW (ref >60)
Glucose, Bld: 310 mg/dL — ABNORMAL HIGH (ref 70–99)
Potassium: 5.7 mmol/L — ABNORMAL HIGH (ref 3.5–5.1)
Sodium: 137 mmol/L (ref 135–145)
Total Bilirubin: 1.1 mg/dL (ref 0.3–1.2)
Total Protein: 5.7 g/dL — ABNORMAL LOW (ref 6.5–8.1)

## 2021-06-16 LAB — BASIC METABOLIC PANEL
Anion gap: 12 (ref 5–15)
BUN: 44 mg/dL — ABNORMAL HIGH (ref 6–20)
CO2: 21 mmol/L — ABNORMAL LOW (ref 22–32)
Calcium: 7.2 mg/dL — ABNORMAL LOW (ref 8.9–10.3)
Chloride: 101 mmol/L (ref 98–111)
Creatinine, Ser: 3.88 mg/dL — ABNORMAL HIGH (ref 0.44–1.00)
GFR, Estimated: 15 mL/min — ABNORMAL LOW (ref >60)
Glucose, Bld: 260 mg/dL — ABNORMAL HIGH (ref 70–99)
Potassium: 4.7 mmol/L (ref 3.5–5.1)
Sodium: 134 mmol/L — ABNORMAL LOW (ref 135–145)

## 2021-06-16 LAB — HEMOGLOBIN A1C
Hgb A1c MFr Bld: 6.5 % — ABNORMAL HIGH (ref 4.8–5.6)
Mean Plasma Glucose: 139.85 mg/dL

## 2021-06-16 LAB — CBC WITH DIFFERENTIAL/PLATELET
Abs Immature Granulocytes: 0.07 10*3/uL (ref 0.00–0.07)
Basophils Absolute: 0.1 10*3/uL (ref 0.0–0.1)
Basophils Relative: 1 %
Eosinophils Absolute: 0.1 10*3/uL (ref 0.0–0.5)
Eosinophils Relative: 1 %
HCT: 24.4 % — ABNORMAL LOW (ref 36.0–46.0)
Hemoglobin: 7.9 g/dL — ABNORMAL LOW (ref 12.0–15.0)
Immature Granulocytes: 1 %
Lymphocytes Relative: 14 %
Lymphs Abs: 1.2 10*3/uL (ref 0.7–4.0)
MCH: 29.8 pg (ref 26.0–34.0)
MCHC: 32.4 g/dL (ref 30.0–36.0)
MCV: 92.1 fL (ref 80.0–100.0)
Monocytes Absolute: 0.4 10*3/uL (ref 0.1–1.0)
Monocytes Relative: 5 %
Neutro Abs: 6.4 10*3/uL (ref 1.7–7.7)
Neutrophils Relative %: 78 %
Platelets: 306 10*3/uL (ref 150–400)
RBC: 2.65 MIL/uL — ABNORMAL LOW (ref 3.87–5.11)
RDW: 14.9 % (ref 11.5–15.5)
WBC: 8.2 10*3/uL (ref 4.0–10.5)
nRBC: 0 % (ref 0.0–0.2)

## 2021-06-16 LAB — RETICULOCYTES
Immature Retic Fract: 19.7 % — ABNORMAL HIGH (ref 2.3–15.9)
RBC.: 2.59 MIL/uL — ABNORMAL LOW (ref 3.87–5.11)
Retic Count, Absolute: 76.9 10*3/uL (ref 19.0–186.0)
Retic Ct Pct: 3 % (ref 0.4–3.1)

## 2021-06-16 LAB — IRON AND TIBC
Iron: 38 ug/dL (ref 28–170)
Saturation Ratios: 22 % (ref 10.4–31.8)
TIBC: 174 ug/dL — ABNORMAL LOW (ref 250–450)
UIBC: 136 ug/dL

## 2021-06-16 LAB — RESP PANEL BY RT-PCR (FLU A&B, COVID) ARPGX2
Influenza A by PCR: NEGATIVE
Influenza B by PCR: NEGATIVE
SARS Coronavirus 2 by RT PCR: NEGATIVE

## 2021-06-16 LAB — TROPONIN I (HIGH SENSITIVITY)
Troponin I (High Sensitivity): 46 ng/L — ABNORMAL HIGH (ref <18)
Troponin I (High Sensitivity): 46 ng/L — ABNORMAL HIGH (ref <18)

## 2021-06-16 LAB — HEPATITIS B SURFACE ANTIGEN: Hepatitis B Surface Ag: NONREACTIVE

## 2021-06-16 LAB — LACTIC ACID, PLASMA: Lactic Acid, Venous: 1 mmol/L (ref 0.5–1.9)

## 2021-06-16 LAB — FERRITIN: Ferritin: 652 ng/mL — ABNORMAL HIGH (ref 11–307)

## 2021-06-16 LAB — GLUCOSE, CAPILLARY: Glucose-Capillary: 270 mg/dL — ABNORMAL HIGH (ref 70–99)

## 2021-06-16 MED ORDER — HEPARIN SODIUM (PORCINE) 5000 UNIT/ML IJ SOLN
5000.0000 [IU] | Freq: Three times a day (TID) | INTRAMUSCULAR | Status: DC
  Filled 2021-06-16 (×3): qty 1

## 2021-06-16 MED ORDER — FUROSEMIDE 10 MG/ML IJ SOLN
120.0000 mg | Freq: Once | INTRAMUSCULAR | Status: AC
  Administered 2021-06-16: 120 mg via INTRAVENOUS
  Filled 2021-06-16: qty 10

## 2021-06-16 MED ORDER — OXYCODONE-ACETAMINOPHEN 5-325 MG PO TABS
ORAL_TABLET | ORAL | Status: AC
  Filled 2021-06-16: qty 1

## 2021-06-16 MED ORDER — FENTANYL CITRATE (PF) 100 MCG/2ML IJ SOLN
INTRAMUSCULAR | Status: AC
  Filled 2021-06-16: qty 2

## 2021-06-16 MED ORDER — GELATIN ABSORBABLE 12-7 MM EX MISC
CUTANEOUS | Status: AC
  Filled 2021-06-16: qty 1

## 2021-06-16 MED ORDER — ONDANSETRON HCL 4 MG PO TABS: 4.0000 mg | ORAL_TABLET | Freq: Three times a day (TID) | ORAL | Status: DC | PRN

## 2021-06-16 MED ORDER — MIDAZOLAM HCL 2 MG/2ML IJ SOLN
INTRAMUSCULAR | Status: AC | PRN
  Administered 2021-06-16: .5 mg via INTRAVENOUS

## 2021-06-16 MED ORDER — HEPARIN SODIUM (PORCINE) 1000 UNIT/ML IJ SOLN
INTRAMUSCULAR | Status: AC
  Filled 2021-06-16: qty 10

## 2021-06-16 MED ORDER — CEFAZOLIN SODIUM-DEXTROSE 2-4 GM/100ML-% IV SOLN: 2.0000 g | INTRAVENOUS | Status: AC

## 2021-06-16 MED ORDER — LABETALOL HCL 100 MG PO TABS
100.0000 mg | ORAL_TABLET | Freq: Two times a day (BID) | ORAL | Status: DC
  Administered 2021-06-16 – 2021-06-20 (×7): 100 mg via ORAL
  Filled 2021-06-16 (×8): qty 1

## 2021-06-16 MED ORDER — ONDANSETRON HCL 4 MG/2ML IJ SOLN
4.0000 mg | Freq: Once | INTRAMUSCULAR | Status: AC
  Administered 2021-06-16: 4 mg via INTRAVENOUS
  Filled 2021-06-16: qty 2

## 2021-06-16 MED ORDER — MIDAZOLAM HCL 2 MG/2ML IJ SOLN
INTRAMUSCULAR | Status: AC
  Filled 2021-06-16: qty 2

## 2021-06-16 MED ORDER — FENTANYL CITRATE (PF) 100 MCG/2ML IJ SOLN
INTRAMUSCULAR | Status: AC | PRN
Start: 2021-06-16 — End: 2021-06-16
  Administered 2021-06-16: 12.5 ug via INTRAVENOUS

## 2021-06-16 MED ORDER — MORPHINE SULFATE (PF) 4 MG/ML IV SOLN
3.0000 mg | Freq: Once | INTRAVENOUS | Status: AC
  Administered 2021-06-16: 3 mg via INTRAVENOUS
  Filled 2021-06-16: qty 1

## 2021-06-16 MED ORDER — LIDOCAINE HCL 1 % IJ SOLN
INTRAMUSCULAR | Status: AC
  Administered 2021-06-16: 10 mL
  Filled 2021-06-16: qty 20

## 2021-06-16 MED ORDER — HEPARIN SODIUM (PORCINE) 1000 UNIT/ML IJ SOLN
INTRAMUSCULAR | Status: AC
  Administered 2021-06-16: 1000 [IU]
  Filled 2021-06-16: qty 3

## 2021-06-16 MED ORDER — SODIUM ZIRCONIUM CYCLOSILICATE 10 G PO PACK
10.0000 g | PACK | Freq: Once | ORAL | Status: AC
  Administered 2021-06-16: 10 g via ORAL
  Filled 2021-06-16: qty 1

## 2021-06-16 MED ORDER — INSULIN ASPART 100 UNIT/ML IJ SOLN: 0.0000 [IU] | Freq: Three times a day (TID) | INTRAMUSCULAR | Status: DC

## 2021-06-16 MED ORDER — FUROSEMIDE 10 MG/ML IJ SOLN
120.0000 mg | Freq: Two times a day (BID) | INTRAVENOUS | Status: DC
  Administered 2021-06-17 – 2021-06-18 (×2): 120 mg via INTRAVENOUS
  Filled 2021-06-16 (×3): qty 12
  Filled 2021-06-16: qty 10
  Filled 2021-06-16 (×4): qty 12

## 2021-06-16 MED ORDER — ROSUVASTATIN CALCIUM 20 MG PO TABS
20.0000 mg | ORAL_TABLET | Freq: Every day | ORAL | Status: DC
  Administered 2021-06-16 – 2021-06-19 (×4): 20 mg via ORAL
  Filled 2021-06-16 (×5): qty 1

## 2021-06-16 MED ORDER — OXYCODONE-ACETAMINOPHEN 5-325 MG PO TABS: 1.0000 | ORAL_TABLET | Freq: Four times a day (QID) | ORAL | Status: DC | PRN

## 2021-06-16 MED ORDER — CEFAZOLIN SODIUM-DEXTROSE 2-4 GM/100ML-% IV SOLN
INTRAVENOUS | Status: AC
  Administered 2021-06-16: 2 g via INTRAVENOUS
  Filled 2021-06-16: qty 100

## 2021-06-16 MED ORDER — CHLORHEXIDINE GLUCONATE CLOTH 2 % EX PADS
6.0000 | MEDICATED_PAD | Freq: Every day | CUTANEOUS | Status: DC
  Administered 2021-06-18: 6 via TOPICAL

## 2021-06-16 MED ORDER — OXYCODONE-ACETAMINOPHEN 5-325 MG PO TABS
1.0000 | ORAL_TABLET | Freq: Four times a day (QID) | ORAL | Status: DC | PRN
  Administered 2021-06-16 – 2021-06-17 (×3): 1 via ORAL
  Filled 2021-06-16 (×3): qty 1

## 2021-06-16 MED ORDER — PANTOPRAZOLE SODIUM 40 MG PO TBEC
40.0000 mg | DELAYED_RELEASE_TABLET | Freq: Every day | ORAL | Status: DC
  Administered 2021-06-16 – 2021-06-20 (×4): 40 mg via ORAL
  Filled 2021-06-16 (×5): qty 1

## 2021-06-16 NOTE — Consult Note (Signed)
[facility]  INPATIENT CONSULTATION  Reason for Consultation: CKD now ESRD Requesting Provider: Dr. Matilde Sprang  HPI: Ann Robertson is an 31 y.o. female with type 1 DM, progressive CKD, HTN, HL who presents with volume overload and nephrology is consulted for evaluation and management.   Patient with progressive azotemia and volume overload over the past few months.  She's had 6 admissions since 10/2020 with last being early 04/2021 -- treated for anemia and volume overload.   She presented to [facility] clinic today for close f/u and was noted to have massive anasarca, O2 sats in the 70s on RA and uremic symptoms so she was directed to the ED for initiation of dialysis.  In ED O2 sats mid 90s on 4L, CXR multifocal PNA, afebrile, BP in 130-140s.    She tells me she's 65lbs over her usual weight.  She's having fatigue and lethargy.  Says UOP is "normal".Labs Na 137, K 5.7, Bicarb 20, BUN 61, Cr 5.3, Alb 2, WBC 8.2, Hb 7.9, PLt 306.   Currently with pain at insertion site of RIJ [facility] which was placed by IR this afternoon.    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   Neuropathy    Preeclampsia    STD (sexually transmitted disease)    PSH: Past Surgical History:  Procedure Laterality Date   CESAREAN SECTION     CESAREAN SECTION N/A 07/05/2013   Procedure: CESAREAN SECTION;  Surgeon: Emily Filbert, MD;  Location: [facility];  Service: Obstetrics;  Laterality: N/A;   CESAREAN SECTION N/A 07/25/2014   Procedure: CESAREAN SECTION;  Surgeon: Donnamae Jude, MD;  Location: [facility];  Service: Obstetrics;  Laterality: N/A;   CESAREAN SECTION N/A 11/21/2015   Procedure: CESAREAN SECTION;  Surgeon: Donnamae Jude, MD;  Location: [facility];  Service: Obstetrics;  Laterality: N/A;   WISDOM TOOTH EXTRACTION      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   Medications Prior to Admission  Medication Sig Dispense Refill   bisacodyl (DULCOLAX) 10 MG suppository Place 1 suppository (10 mg total) rectally daily as needed for moderate constipation. 12 suppository 0   Continuous Blood Gluc Sensor (DEXCOM G6 SENSOR) MISC Apply topically.     Continuous Blood Gluc Sensor (DEXCOM G6 SENSOR) MISC      Continuous Blood Gluc Transmit (DEXCOM G6 TRANSMITTER) MISC      enalapril (VASOTEC) 5 MG tablet Take 5 mg by mouth 2 (two) times daily.     insulin glargine (LANTUS SOLOSTAR) 100 UNIT/ML Solostar Pen Inject 2-5 Units into the skin daily as needed (high BGL). Inject 2-5 units as needed for High glucose levels     insulin lispro (HUMALOG) 100 UNIT/ML KwikPen Inject 2-5 Units into the skin 3 (three) times daily as needed (for a high BGL). Per sliding scale     Insulin Pen Needle (PEN NEEDLES) 30G X 8 MM MISC 1 each by Does not apply route daily. E11.9 90 each 0   labetalol (NORMODYNE) 100 MG tablet Take 100 mg by mouth 2 (two) times daily.     methocarbamol (ROBAXIN) 750 MG tablet Take 750 mg by mouth every 8 (  eight) hours as needed for muscle spasms.     moxifloxacin (VIGAMOX) 0.5 % ophthalmic solution Place 1 drop into both eyes daily.     neomycin-polymyxin-dexameth (MAXITROL) 0.1 % OINT Place 1 application into both eyes daily.     omeprazole (PRILOSEC) 20 MG capsule Take 20 mg by mouth daily.     ondansetron (ZOFRAN) 4 MG tablet Take 1 tablet (4 mg total) by mouth every 8 (eight) hours as needed for nausea or vomiting. 20 tablet 0   oxyCODONE-acetaminophen (PERCOCET) 10-325 MG tablet Take 1 tablet by mouth every 4 (four) hours as needed for pain.     potassium chloride SA (KLOR-CON M) 20 MEQ tablet Take 1 tablet (20 mEq total) by mouth 2 (two) times daily. 30 tablet 0   prednisoLONE acetate (PRED FORTE) 1 %  ophthalmic suspension Place 1 drop into the left eye every 4 (four) hours.     rosuvastatin (CRESTOR) 20 MG tablet Take 20 mg by mouth at bedtime.     torsemide (DEMADEX) 20 MG tablet Take 2 tablets (40 mg total) by mouth 2 (two) times daily. 120 tablet 2   Vitamin D, Ergocalciferol, (DRISDOL) 1.25 MG (50000 UNIT) CAPS capsule Take 50,000 Units by mouth every Tuesday. (Patient not taking: Reported on 04/18/2021)      ALLERGIES:   Allergies  Allergen Reactions   Hydromorphone Hcl Itching and Rash    FAM HX: Family History  Problem Relation Age of Onset   Stroke Mother    Hypertension Mother    Heart disease Mother    Kidney disease Mother    Hyperlipidemia Father    Diabetes Father     Social History:   reports that she has been smoking cigarettes. She has a 2.25 pack-year smoking history. She has never used smokeless tobacco. She reports current alcohol use. She reports that she does not use drugs.  ROS: 12 system ROS neg except per HPI.   Blood pressure (!) 145/89, pulse (!) 103, temperature 98 F (36.7 C), temperature source Temporal, resp. rate 20, height 5\' 5"  (1.651 m), weight 76.7 kg, SpO2 99 %. PHYSICAL EXAM: Gen: lethargic appearing woman at 30 degrees in bed  Eyes: anicteric ENT: MM dry mouth breathing Neck: ^ JVD CV:  tachycardic, ST on monitor, no rub Abd:  soft, mild distended, nontender Lungs: dec BS R base, rales present to 2/3 lung fields, sl ^ WOB GU: no foley Extr:  2+ diffuse edema Neuro: conversant but sleepy HD access: RIJ [facility] c/d/i   Results for orders placed or performed during the hospital encounter of 06/16/21 (from the past 48 hour(s))  Resp Panel by RT-PCR (Flu A&B, Covid) Nasopharyngeal Swab     Status: None   Collection Time: 06/16/21 12:52 PM   Specimen: Nasopharyngeal Swab; Nasopharyngeal(NP) swabs in vial transport medium  Result Value Ref Range   SARS Coronavirus 2 by RT PCR NEGATIVE NEGATIVE    Comment: (NOTE) SARS-CoV-2 target  nucleic acids are NOT DETECTED.  The SARS-CoV-2 RNA is generally detectable in upper respiratory specimens during the acute phase of infection. The lowest concentration of SARS-CoV-2 viral copies this assay can detect is 138 copies/mL. A negative result does not preclude SARS-Cov-2 infection and should not be used as the sole basis for treatment or other patient management decisions. A negative result may occur with  improper specimen collection/handling, submission of specimen other than nasopharyngeal swab, presence of viral mutation(s) within the areas targeted by this assay, and inadequate number of viral copies(<138  copies/mL). A negative result must be combined with clinical observations, patient history, and epidemiological information. The expected result is Negative.  Fact Sheet for Patients:  EntrepreneurPulse.com.au  Fact Sheet for Healthcare Providers:  IncredibleEmployment.be  This test is no t yet approved or cleared by the [place] FDA and  has been authorized for detection and/or diagnosis of SARS-CoV-2 by FDA under an Emergency Use Authorization (EUA). This EUA will remain  in effect (meaning this test can be used) for the duration of the COVID-19 declaration under Section 564(b)(1) of the Act, 21 U.S.C.section 360bbb-3(b)(1), unless the authorization is terminated  or revoked sooner.       Influenza A by PCR NEGATIVE NEGATIVE   Influenza B by PCR NEGATIVE NEGATIVE    Comment: (NOTE) The Xpert Xpress SARS-CoV-2/FLU/RSV plus assay is intended as an aid in the diagnosis of influenza from Nasopharyngeal swab specimens and should not be used as a sole basis for treatment. Nasal washings and aspirates are unacceptable for Xpert Xpress SARS-CoV-2/FLU/RSV testing.  Fact Sheet for Patients: EntrepreneurPulse.com.au  Fact Sheet for Healthcare Providers: IncredibleEmployment.be  This test  is not yet approved or cleared by the [place] FDA and has been authorized for detection and/or diagnosis of SARS-CoV-2 by FDA under an Emergency Use Authorization (EUA). This EUA will remain in effect (meaning this test can be used) for the duration of the COVID-19 declaration under Section 564(b)(1) of the Act, 21 U.S.C. section 360bbb-3(b)(1), unless the authorization is terminated or revoked.  Performed at [facility], [place] [address], [place], [place] [address]   Comprehensive metabolic panel     Status: Abnormal   Collection Time: 06/16/21  1:14 PM  Result Value Ref Range   Sodium 137 135 - 145 mmol/L   Potassium 5.7 (H) 3.5 - 5.1 mmol/L   Chloride 101 98 - 111 mmol/L   CO2 20 (L) 22 - 32 mmol/L   Glucose, Bld 310 (H) 70 - 99 mg/dL    Comment: Glucose reference range applies only to samples taken after fasting for at least 8 hours.   BUN 61 (H) 6 - 20 mg/dL   Creatinine, Ser 5.28 (H) 0.44 - 1.00 mg/dL   Calcium 8.0 (L) 8.9 - 10.3 mg/dL   Total Protein 5.7 (L) 6.5 - 8.1 g/dL   Albumin 2.0 (L) 3.5 - 5.0 g/dL   AST 20 15 - 41 U/L   ALT 22 0 - 44 U/L   Alkaline Phosphatase 124 38 - 126 U/L   Total Bilirubin 1.1 0.3 - 1.2 mg/dL   GFR, Estimated 10 (L) >60 mL/min    Comment: (NOTE) Calculated using the CKD-EPI Creatinine Equation (2021)    Anion gap 16 (H) 5 - 15    Comment: Performed at [facility], [place] [address], [place], [place] [address]  Troponin I (High Sensitivity)     Status: Abnormal   Collection Time: 06/16/21  1:14 PM  Result Value Ref Range   Troponin I (High Sensitivity) 46 (H) <18 ng/L    Comment: (NOTE) Elevated high sensitivity troponin I (hsTnI) values and significant  changes across serial measurements may suggest ACS but many other  chronic and acute conditions are known to elevate hsTnI results.  Refer to the "Links" section for chest pain algorithms and additional  guidance. Performed at [facility], [place] [address]  [place], [place] [address]   CBC with Differential     Status: Abnormal   Collection Time: 06/16/21  1:14 PM  Result Value Ref Range   WBC 8.2 4.0 - 10.5 K/uL   RBC 2.65 (L) 3.87 - 5.11 MIL/uL   Hemoglobin 7.9 (L) 12.0 - 15.0 g/dL   HCT 24.4 (L) 36.0 - 46.0 %   MCV 92.1 80.0 - 100.0 fL   MCH 29.8 26.0 - 34.0 pg   MCHC 32.4 30.0 - 36.0 g/dL   RDW 14.9 11.5 - 15.5 %   Platelets 306 150 - 400 K/uL   nRBC 0.0 0.0 - 0.2 %   Neutrophils Relative % 78 %   Neutro Abs 6.4 1.7 - 7.7 K/uL   Lymphocytes Relative 14 %   Lymphs Abs 1.2 0.7 - 4.0 K/uL   Monocytes Relative 5 %   Monocytes Absolute 0.4 0.1 - 1.0 K/uL   Eosinophils Relative 1 %   Eosinophils Absolute 0.1 0.0 - 0.5 K/uL   Basophils Relative 1 %   Basophils Absolute 0.1 0.0 - 0.1 K/uL   Immature Granulocytes 1 %   Abs Immature Granulocytes 0.07 0.00 - 0.07 K/uL    Comment: Performed at [facility], [address] [place], [place] [address]  Troponin I (High Sensitivity)     Status: Abnormal   Collection Time: 06/16/21  3:04 PM  Result Value Ref Range   Troponin I (High Sensitivity) 46 (H) <18 ng/L    Comment: (NOTE) Elevated high sensitivity troponin I (hsTnI) values and significant  changes across serial measurements may suggest ACS but many other  chronic and acute conditions are known to elevate hsTnI results.  Refer to the "Links" section for chest pain algorithms and additional  guidance. Performed at [facility], [place] [address], [place], [place] [address]     DG Chest Portable 1 View  Result Date: 06/16/2021 CLINICAL DATA:  Dyspnea.  Shortness of breath. EXAM: PORTABLE CHEST 1 VIEW COMPARISON:  04/30/2021 FINDINGS: A moderate right pleural effusion is identified. Bilateral, multifocal airspace densities are noted within the left upper, left lower and right upper lobe. Atelectasis versus airspace disease noted within the right base. Visualized osseous structures are unremarkable. IMPRESSION: 1. Bilateral  airspace opacities compatible with multifocal pneumonia. Followup PA and lateral chest X-ray is recommended in 3-4 weeks following trial of antibiotic therapy to ensure resolution and exclude underlying malignancy. 2. Moderate right pleural effusion. Electronically Signed   By: Kerby Moors M.D.   On: 06/16/2021 13:09    Assessment/Plan **acute hypoxic respiratory failure:  sats in 70s on RA, CXR/exam c/w pulmonary edema.  IV diuretic in ED + UF 1L tonight with HD.  Plan ongoing diuretic + UF with dialysis.  2g sodium, fluid restricted diet.   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  Presented with uremic symptoms and volume overload, initiated HD tonight, plan for next tx tomorrow.  Will need CLIP, perm access.    **Hyperkalemia:  was given lokelma in ED + HD tonight.  AM labs.   **Anemia:  Hb 7.9, no report of active bleeding.  Was getting outpt IV iron and ESA for a few doses - recheck iron indices now and give Iron + ESA here.  Required transfusion in 04/2021, repeat if < 7 here.   **Secondary hyperparathyroidism: PTH in 04/2021 was 167, repeat here.  Corr ca ok, check phos.  Was high in 04/2021.  Renal diet.   **DM type 1: onset age 28, poorly controlled over  long term though recently improved in setting of worsening renal function.  Insulin per primary.   Dispo -- needs to stay for ultrafiltration to improve pulmonary status and to set up outpt HD.  She is under the impression she's leaving tomorrow and I advised strongly against that.    Justin Mend 06/16/2021, 6:07 PM

## 2021-06-16 NOTE — Procedures (Signed)
Interventional Radiology Procedure:   Indications: Acute on chronic renal failure and needs urgent hemodialysis  Procedure: Placement of tunneled dialysis catheter  Findings: Right jugular Palindrome, 19 cm tip to cuff, tip at SVC/RA junction  Complications: No immediate complications noted.     EBL: Minimal  Plan: Dialysis catheter is ready to use.    Yasamin Karel R. Anselm Pancoast, MD  Pager: [phone]

## 2021-06-16 NOTE — Procedures (Signed)
I was present at this dialysis session, have reviewed the session itself and made  appropriate changes  1st HD treatment via RIJ [place] placed earlier today Going well - due to massive volume overload 1L UF with today's treatment BP 122/88 HR 103 Qb 250  Jannifer Hick MD [facility] pager [phone]   06/16/2021, 6:33 PM

## 2021-06-16 NOTE — ED Notes (Signed)
Pt transported to IR at this time.

## 2021-06-16 NOTE — ED Notes (Signed)
Requesting to speak with charge nurse. Santiago Glad, charge nurse aware

## 2021-06-16 NOTE — ED Triage Notes (Signed)
Pt bib [facility] from the vascular specialist where she was sent by her nephrologist to get dialysis access. Once at the vascular office, pt became shob and had an oxygen sat around 76% on room air. Pt arrives to ED needing emergent dialysis and is on 4L [place] satting 96%. AOx4.   EMS vitals: CBG 378

## 2021-06-16 NOTE — H&P (Addendum)
Date: 06/16/2021               Patient Name:  Ann Robertson MRN: [id number]  DOB: [birth year] Age / Sex: 31 y.o., female   PCP: [facility] [place] Service: Internal Medicine Teaching Service         Attending Physician: Dr. Charise Killian, MD    First Contact: Ann Fuchs, DO Pager: KM [phone]  Second Contact: Gaylan Gerold, DO Pager: Liliane Shi [phone]       After Hours (After 5p/  First Contact Pager: [phone]  weekends / holidays): Second Contact Pager: [phone]   SUBJECTIVE  Chief Complaint: hypoxia  History of Present Illness: Ann Robertson is a 31 y.o. female with a pertinent PMH of type 1 diabetes c/p nephropathy, anemia of chronic disease, proliferative diabetic retinopathy, who presents to [facility] from nephrology office for acute hypoxic respiratory failure.  Her oxygen there was 76%.   Patient reports doing well the last couple days.  States that shortness of breath started yesterday.  This never happened before.  She report worsening shortness of breath with exertion.  She denies orthopnea.  Also endorses dry cough.  She reports taking all her medications at home including blood pressure and torsemide.  She reports normal urine output.  States that her lower extremity swelling is stable.  She denies nausea, vomiting, constipation, diarrhea, fever, chills.  She has had DKA before and states that her symptoms are currently are not consistent with DKA.  She has abdominal pain which was chronic.  Denies any worsening abdominal pain.  She is taking oxycodone for the pain.  States that pain is constant and not triggered with food.  Pain located in the epigastric region.  She said that they have done extensive work-up but could not find a cause.  She reports taking 4 units of Lantus with sliding scale Humalog.  She endorses hypoglycemia with CBG of 30-42 days ago.  She is seeing an endocrinologist, last visit 1 month ago.  In the ED, the patient was placed on 4L [place]. Lab  evaluation significant for hgb of 7.9,K+ 5.7, BUN 61, creatinine 5.28, troponin 46.  CXR showed pulmonary edema and pleural effusions.  EKG showed sinus tachycardia.  She had a right jugular tunneled cath placed by IR and receiving HD tonight.   Medications: No current facility-administered medications on file prior to encounter.   Current Outpatient Medications on File Prior to Encounter  Medication Sig Dispense Refill   bisacodyl (DULCOLAX) 10 MG suppository Place 1 suppository (10 mg total) rectally daily as needed for moderate constipation. 12 suppository 0   Continuous Blood Gluc Sensor (DEXCOM G6 SENSOR) MISC Apply topically.     Continuous Blood Gluc Sensor (DEXCOM G6 SENSOR) MISC      Continuous Blood Gluc Transmit (DEXCOM G6 TRANSMITTER) MISC      enalapril (VASOTEC) 5 MG tablet Take 5 mg by mouth 2 (two) times daily.     insulin glargine (LANTUS SOLOSTAR) 100 UNIT/ML Solostar Pen Inject 2-5 Units into the skin daily as needed (high BGL). Inject 2-5 units as needed for High glucose levels     insulin lispro (HUMALOG) 100 UNIT/ML KwikPen Inject 2-5 Units into the skin 3 (three) times daily as needed (for a high BGL). Per sliding scale     Insulin Pen Needle (PEN NEEDLES) 30G X 8 MM MISC 1 each by Does not apply route daily. E11.9 90 each 0   labetalol (NORMODYNE)  100 MG tablet Take 100 mg by mouth 2 (two) times daily.     methocarbamol (ROBAXIN) 750 MG tablet Take 750 mg by mouth every 8 (eight) hours as needed for muscle spasms.     moxifloxacin (VIGAMOX) 0.5 % ophthalmic solution Place 1 drop into both eyes daily.     neomycin-polymyxin-dexameth (MAXITROL) 0.1 % OINT Place 1 application into both eyes daily.     omeprazole (PRILOSEC) 20 MG capsule Take 20 mg by mouth daily.     ondansetron (ZOFRAN) 4 MG tablet Take 1 tablet (4 mg total) by mouth every 8 (eight) hours as needed for nausea or vomiting. 20 tablet 0   oxyCODONE-acetaminophen (PERCOCET) 10-325 MG tablet Take 1 tablet by  mouth every 4 (four) hours as needed for pain.     potassium chloride SA (KLOR-CON M) 20 MEQ tablet Take 1 tablet (20 mEq total) by mouth 2 (two) times daily. 30 tablet 0   prednisoLONE acetate (PRED FORTE) 1 % ophthalmic suspension Place 1 drop into the left eye every 4 (four) hours.     rosuvastatin (CRESTOR) 20 MG tablet Take 20 mg by mouth at bedtime.     torsemide (DEMADEX) 20 MG tablet Take 2 tablets (40 mg total) by mouth 2 (two) times daily. 120 tablet 2   Vitamin D, Ergocalciferol, (DRISDOL) 1.25 MG (50000 UNIT) CAPS capsule Take 50,000 Units by mouth every Tuesday. 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  Also have family in the area but did not specify. PCP -at [facility]. Andy Gauss, NP Nephrology- [facility]-Dr. Janese Banks Substance use -denies smoking, alcohol or drug use  Family History: Family History  Problem Relation Age of Onset   Stroke Mother    Hypertension Mother    Heart disease Mother    Kidney disease Mother    Hyperlipidemia Father    Diabetes Father     Allergies: Allergies as of 06/16/2021 - Review Complete 06/16/2021  Allergen Reaction Noted   Hydromorphone hcl Itching and Rash 01/02/2013    Review of Systems: A complete ROS was negative except as per HPI.   OBJECTIVE:  Physical Exam: Blood pressure 122/88, pulse (!) 103, temperature 98 F (36.7 C), temperature source Temporal, resp. rate 17, height 5\' 5"  (1.651 m), weight 76.7 kg, SpO2 100 %. Constitutional: Chronically ill appearing seen in dialysis unit, appears uncomfortable and anxious with first HD session HENT: normocephalic atraumatic, mucous membranes moist Eyes: conjunctiva  non-erythematous Neck: supple, right tunneled catheter in place Cardiovascular: tachycardia and sinus rhythm, no m/r/g Pulmonary/Chest: regular work of breathing on 4 L [place], lungs clear to auscultation bilaterally Abdominal: soft, tenderness to palpation below xiphoid, non-distended MSK: normal bulk and tone, 3+ pitting edema to knees bilaterally, pain in hands Neurological: alert & oriented x 3 Skin: warm and dry Psych: anxious   Pertinent Labs: CBC    Component Value Date/Time   WBC 8.2 06/16/2021 1314   RBC 2.65 (L) 06/16/2021 1314   HGB 7.9 (L) 06/16/2021 1314   HGB 12.7 06/06/2015 0000   HCT 24.4 (L) 06/16/2021 1314   HCT 39 06/06/2015 0000   PLT 306 06/16/2021 1314   PLT 383 06/06/2015  0000   MCV 92.1 06/16/2021 1314   MCH 29.8 06/16/2021 1314   MCHC 32.4 06/16/2021 1314   RDW 14.9 06/16/2021 1314   LYMPHSABS 1.2 06/16/2021 1314   MONOABS 0.4 06/16/2021 1314   EOSABS 0.1 06/16/2021 1314   BASOSABS 0.1 06/16/2021 1314     CMP     Component Value Date/Time   NA 137 06/16/2021 1314   K 5.7 (H) 06/16/2021 1314   CL 101 06/16/2021 1314   CO2 20 (L) 06/16/2021 1314   GLUCOSE 310 (H) 06/16/2021 1314   BUN 61 (H) 06/16/2021 1314   CREATININE 5.28 (H) 06/16/2021 1314   CREATININE 0.64 11/06/2015 1125   CALCIUM 8.0 (L) 06/16/2021 1314   PROT 5.7 (L) 06/16/2021 1314   ALBUMIN 2.0 (L) 06/16/2021 1314   AST 20 06/16/2021 1314   ALT 22 06/16/2021 1314   ALKPHOS 124 06/16/2021 1314   BILITOT 1.1 06/16/2021 1314   GFRNONAA 10 (L) 06/16/2021 1314   GFRAA >60 02/18/2019 1752    Pertinent Imaging: CXR IMPRESSION: 1. Bilateral airspace opacities compatible with multifocal pneumonia. Followup PA and lateral chest X-ray is recommended in 3-4 weeks following trial of antibiotic therapy to ensure resolution and exclude underlying malignancy. 2. Moderate right pleural effusion.  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 female with a pertinent PMH of type 1 diabetes c/p nephropathy, anemia of chronic disease, proliferative diabetic retinopathy, who presents to [facility] from nephrology office for acute hypoxic respiratory failure secondary to progression to ESRD requiring dialysis.  Plan: #Acute hypoxic respiratory failure #ESRD on HD #Hyperkalemia She is quite volume overloaded on exam.  Chest x-ray showed left pleural effusion and increasing bilateral lower lung base haziness.  She is about 10 kg up from her last weight on December 1st (64.9 kg).  Not sure about her urine output at home but suspect it is going down.  She is getting HD tonight for volume removal and another session tomorrow per nephro.  She is also getting diuresis with IV Lasix 120 mg BID -Appreciate nephrology recommendations -Strict I/O -Daily weight  -BMP in a.m.  #Anion gap metabolic acidosis Bicarb 20 with anion gap of 16.  Low suspicion for DKA without symptoms.  Patient also denies symptoms of DKA.   - Check beta hydroxybutyrate acid - lactic acid  - UA for ketones. - repeat BMP  #Anemia of chronic disease Hemoglobin of 7.9. History of anemia requiring transfusions (last 12/22). Outpatient work-up with EGD an colonoscopy negative.  It appears that she also received Feraheme outpatient. -Check ferritin, iron, TIBC and reticulocyte -EPO per nephrology -Transfuse goal > 7  #Type 1 diabetes mellitus c/p neuropathy Following Dr. Janese Banks, last visit 1 month ago, could not see notes in the chart.  A1c 6.92 months ago.  Currently taking 4 units of Lantus with sliding scale Humalog.  She did reports hypoglycemia events likely due to worsening of her CKD. Prior history of  DKA. -Sensitive sliding scale -Repeat A1c.  Be falsely low due to ESRD  #HLD -Resume Crestor  #HTN Home medications includes labetalol and enalapril. HR elevated in the 100s. -Resume labetalol -Hold enalapril  Best Practice: Diet: Renal diet IVF:  Fluids: NA VTE: heparin injection 5,000 Units Start: 06/16/21 2200 Code: Full AB: NA Status: Inpatient with expected length of stay greater than 2 midnights. Anticipated Discharge Location: Home Barriers to Discharge:  Dialysis  Signature: Daleen Bo. Danashia Landers, D.O.  Internal Medicine Resident, PGY-1 [facility]  Pager: [phone] 6:37 PM, 06/16/2021   Please contact the on call pager after 5 pm and on weekends at [phone].

## 2021-06-16 NOTE — ED Provider Notes (Addendum)
[facility] EMERGENCY DEPARTMENT Provider Note  CSN: [id number] Arrival date & time: 06/16/21 1243  Chief Complaint(s) Shortness of Breath and Leg Swelling  HPI Ann Robertson is a 31 y.o. female with T1DM, PMH ESRD not currently on dialysis who presents from nephrology clinic for concern for shortness of breath and fluid overload.  Patient was in nephrology clinic to begin her work-up for a dialysis fistula placement when she was found to be 75% on room air, fluid overload with significant orthopnea.  She states that the symptoms of been gradually worsening over the last 2 weeks but worsened over the last 24 hours.  She arrives with complaints of shortness of breath but denies chest pain, abdominal pain, nausea, vomiting or other systemic symptoms.  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   Neuropathy    Preeclampsia    STD (sexually transmitted disease)    Patient Active Problem List   Diagnosis Date Noted   Symptomatic anemia 04/30/2021   Hypomagnesemia 04/30/2021   Hyperglycemia 04/30/2021   Chest pain 04/21/2021   Sore throat 04/21/2021   Lactic acid increased 04/20/2021   Bilateral fixed dilatation of pupils 04/19/2021   CKD (chronic kidney disease), stage III ([place]) 04/19/2021   Hyponatremia 04/19/2021   Acute urinary retention 04/19/2021   DKA, type 1 ([place]) 06/02/7251   Acute metabolic encephalopathy 66/44/0347   Hyperkalemia 04/18/2021   Sepsis ([place]) 04/18/2021   Prolonged QT interval 04/11/2021   Chronic pain 04/11/2021   Acute renal failure superimposed on stage 3 chronic kidney disease, unspecified acute renal failure type, unspecified whether stage 3a or 3b CKD ([place]) 04/10/2021   Anemia due to chronic kidney disease 04/10/2021   Mixed hyperlipidemia 03/09/2021   Type 1 diabetes mellitus with hyperglycemia ([place]) 03/09/2021   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 ([place]) 03/04/2021   Anasarca 02/15/2021   Constipation 02/15/2021   Hypoalbuminemia 02/15/2021   Hypokalemia 02/15/2021   Diabetic nephropathy ([place]) 02/15/2021   Bacterial vaginosis 02/15/2021   Sinus tachycardia 02/15/2021   Generalized edema 01/31/2021   Hypertensive urgency 01/28/2021   DKA (diabetic ketoacidosis) ([place]) 01/26/2021   Acute renal failure superimposed on stage 3a chronic kidney disease ([place]) 01/26/2021   Anemia 01/26/2021   HTN (hypertension) 01/26/2021   Peripheral neuropathy 01/26/2021   Abdominal pain, acute, epigastric 10/30/2020   Abdominal pain 10/30/2020   Nausea 06/10/2020   Foot pain, right 04/05/2018   Postpartum care following cesarean delivery 11/21/2015   Preterm labor 11/16/2015   Noncompliance with diabetes treatment 11/15/2015   Traumatic injury during pregnancy in third trimester 11/14/2015   Maternal chronic hypertension 11/06/2015   Previous cesarean section complicating pregnancy 42/59/5638   Short interval between pregnancies complicating pregnancy, antepartum 07/14/2015   History of preterm delivery, currently pregnant 07/14/2015   History of pre-eclampsia in prior pregnancy, currently pregnant 07/14/2015   Supervision of high risk pregnancy, antepartum 07/14/2015   History of preterm delivery 09/06/2014   History of polyhydramnios 09/06/2014   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 Authorizing Provider  bisacodyl (DULCOLAX) 10 MG suppository Place 1 suppository (10 mg total) rectally daily as needed for  moderate constipation. 02/21/21   Terrilee Croak, MD  Continuous Blood Gluc Sensor (DEXCOM G6 SENSOR) MISC Apply topically.  03/12/21   [provider]  Continuous Blood Gluc Sensor (DEXCOM G6 SENSOR) MISC  03/12/21   [provider]  Continuous Blood Gluc Transmit (DEXCOM G6 TRANSMITTER) MISC  02/22/21   [provider]  enalapril (VASOTEC) 5 MG tablet Take 5 mg by mouth 2 (two) times daily. 03/18/21   [provider]  insulin glargine (LANTUS SOLOSTAR) 100 UNIT/ML Solostar Pen Inject 2-5 Units into the skin daily as needed (high BGL). Inject 2-5 units as needed for High glucose levels 05/01/21   Aline August, MD  insulin lispro (HUMALOG) 100 UNIT/ML KwikPen Inject 2-5 Units into the skin 3 (three) times daily as needed (for a high BGL). Per sliding scale    [provider]  Insulin Pen Needle (PEN NEEDLES) 30G X 8 MM MISC 1 each by Does not apply route daily. E11.9 05/14/19   Renato Shin, MD  labetalol (NORMODYNE) 100 MG tablet Take 100 mg by mouth 2 (two) times daily. 03/26/21   [provider]  methocarbamol (ROBAXIN) 750 MG tablet Take 750 mg by mouth every 8 (eight) hours as needed for muscle spasms. 03/16/21   [provider]  moxifloxacin (VIGAMOX) 0.5 % ophthalmic solution Place 1 drop into both eyes daily.    [provider]  neomycin-polymyxin-dexameth (MAXITROL) 0.1 % OINT Place 1 application into both eyes daily.    [provider]  omeprazole (PRILOSEC) 20 MG capsule Take 20 mg by mouth daily.    [provider]  ondansetron (ZOFRAN) 4 MG tablet Take 1 tablet (4 mg total) by mouth every 8 (eight) hours as needed for nausea or vomiting. 06/10/20   Renato Shin, MD  oxyCODONE-acetaminophen (PERCOCET) 10-325 MG tablet Take 1 tablet by mouth every 4 (four) hours as needed for pain. 02/13/21   [provider]  potassium chloride SA (KLOR-CON M) 20 MEQ tablet Take 1 tablet (20 mEq total) by mouth 2 (two) times daily. 05/01/21   Aline August, MD  prednisoLONE acetate (PRED FORTE) 1 % ophthalmic suspension Place 1 drop  into the left eye every 4 (four) hours. 04/23/21   [provider]  rosuvastatin (CRESTOR) 20 MG tablet Take 20 mg by mouth at bedtime. 03/16/21   [provider]  torsemide (DEMADEX) 20 MG tablet Take 2 tablets (40 mg total) by mouth 2 (two) times daily. 05/01/21 07/30/21  Aline August, MD  Vitamin D, Ergocalciferol, (DRISDOL) 1.25 MG (50000 UNIT) CAPS capsule Take 50,000 Units by mouth every Tuesday. Patient not taking: Reported on 04/18/2021    [provider]                                                                                                                                    Past Surgical History Past Surgical History:  Procedure Laterality Date  CESAREAN SECTION     CESAREAN SECTION N/A 07/05/2013   Procedure: CESAREAN SECTION;  Surgeon: Emily Filbert, MD;  Location: [facility];  Service: Obstetrics;  Laterality: N/A;   CESAREAN SECTION N/A 07/25/2014   Procedure: CESAREAN SECTION;  Surgeon: Donnamae Jude, MD;  Location: [facility];  Service: Obstetrics;  Laterality: N/A;   CESAREAN SECTION N/A 11/21/2015   Procedure: CESAREAN SECTION;  Surgeon: Donnamae Jude, MD;  Location: [facility];  Service: Obstetrics;  Laterality: N/A;   WISDOM TOOTH EXTRACTION     Family History Family History  Problem Relation Age of Onset   Stroke Mother    Hypertension Mother    Heart disease Mother    Kidney disease Mother    Hyperlipidemia Father    Diabetes Father     Social History Social History   Tobacco Use   Smoking status: Every Day    Packs/day: 0.25    Years: 9.00    Pack years: 2.25    Types: Cigarettes   Smokeless tobacco: Never  Substance Use Topics   Alcohol use: Yes    Comment: occasional   Drug use: No   Allergies Hydromorphone hcl  Review of Systems Review of Systems  Respiratory:  Positive for cough and shortness of breath.   Cardiovascular:  Positive for leg swelling.   Physical Exam Vital Signs  I have reviewed the triage  vital signs BP (!) 144/92    Pulse (!) 103    Temp 97.8 F (36.6 C) (Oral)    Resp 20    Ht 5\' 5"  (1.651 m)    Wt 76.7 kg    SpO2 100%    BMI 28.12 kg/m   Physical Exam Vitals and nursing note reviewed.  Constitutional:      General: She is not in acute distress.    Appearance: She is well-developed.  HENT:     Head: Normocephalic and atraumatic.  Eyes:     Conjunctiva/sclera: Conjunctivae normal.  Cardiovascular:     Rate and Rhythm: Normal rate and regular rhythm.     Heart sounds: No murmur heard. Pulmonary:     Effort: Pulmonary effort is normal. No respiratory distress.     Breath sounds: Rales present.  Abdominal:     Palpations: Abdomen is soft.     Tenderness: There is no abdominal tenderness.  Musculoskeletal:        General: No swelling.     Cervical back: Neck supple.     Right lower leg: Edema present.     Left lower leg: Edema present.  Skin:    General: Skin is warm and dry.     Capillary Refill: Capillary refill takes less than 2 seconds.  Neurological:     Mental Status: She is alert.  Psychiatric:        Mood and Affect: Mood normal.    ED Results and Treatments Labs (all labs ordered are listed, but only abnormal results are displayed) Labs Reviewed  COMPREHENSIVE METABOLIC PANEL - Abnormal; Notable for the following components:      Result Value   Potassium 5.7 (*)    CO2 20 (*)    Glucose, Bld 310 (*)    BUN 61 (*)    Creatinine, Ser 5.28 (*)    Calcium 8.0 (*)    Total Protein 5.7 (*)    Albumin 2.0 (*)    GFR, Estimated 10 (*)    Anion gap 16 (*)    All other  components within normal limits  CBC WITH DIFFERENTIAL/PLATELET - Abnormal; Notable for the following components:   RBC 2.65 (*)    Hemoglobin 7.9 (*)    HCT 24.4 (*)    All other components within normal limits  TROPONIN I (HIGH SENSITIVITY) - Abnormal; Notable for the following components:   Troponin I (High Sensitivity) 46 (*)    All other components within normal limits   RESP PANEL BY RT-PCR (FLU A&B, COVID) ARPGX2  TROPONIN I (HIGH SENSITIVITY)                                                                                                                          Radiology DG Chest Portable 1 View  Result Date: 06/16/2021 CLINICAL DATA:  Dyspnea.  Shortness of breath. EXAM: PORTABLE CHEST 1 VIEW COMPARISON:  04/30/2021 FINDINGS: A moderate right pleural effusion is identified. Bilateral, multifocal airspace densities are noted within the left upper, left lower and right upper lobe. Atelectasis versus airspace disease noted within the right base. Visualized osseous structures are unremarkable. IMPRESSION: 1. Bilateral airspace opacities compatible with multifocal pneumonia. Followup PA and lateral chest X-ray is recommended in 3-4 weeks following trial of antibiotic therapy to ensure resolution and exclude underlying malignancy. 2. Moderate right pleural effusion. Electronically Signed   By: Kerby Moors M.D.   On: 06/16/2021 13:09    Pertinent labs & imaging results that were available during my care of the patient were reviewed by me and considered in my medical decision making (see MDM for details).  Medications Ordered in ED Medications  ceFAZolin (ANCEF) IVPB 2g/100 mL premix (has no administration in time range)  sodium zirconium cyclosilicate (LOKELMA) packet 10 g (has no administration in time range)  furosemide (LASIX) 120 mg in dextrose 5 % 50 mL IVPB (has no administration in time range)  morphine 4 MG/ML injection 3 mg (3 mg Intravenous Given 06/16/21 1512)  ondansetron (ZOFRAN) injection 4 mg (4 mg Intravenous Given 06/16/21 1512)                                                                                                                                     Procedures .Critical Care Performed by: Teressa Lower, MD Authorized by: Teressa Lower, MD   Critical care provider statement:    Critical care time (minutes):  30   Critical care  was necessary to treat or prevent  imminent or life-threatening deterioration of the following conditions:  Respiratory failure and renal failure   Critical care was time spent personally by me on the following activities:  Development of treatment plan with patient or surrogate, discussions with consultants, evaluation of patient's response to treatment, examination of patient, ordering and review of laboratory studies, ordering and review of radiographic studies, ordering and performing treatments and interventions, pulse oximetry, re-evaluation of patient's condition and review of old charts  (including critical care time)  Medical Decision Making / ED Course   This patient presents to the ED for concern of fluid overload and shortness of breath, this involves an extensive number of treatment options, and is a complaint that carries with it a high risk of complications and morbidity.  The differential diagnosis includes kidney failure, pleural effusions, pulmonary edema, ACS, PE, PNA  MDM: Patient seen the emergency department for evaluation of shortness of breath.  Physical exam reveals bilateral lower extremity edema, mild upper extremity edema, rales at bilateral bases.  Patient maintaining appropriate oxygen saturations at 4 L nasal cannula.  Laboratory evaluation with a hemoglobin of 7.9, mild hyperkalemia at 5.7, BUN 61, creatinine 5.28 which is significant elevation from her baseline around 3.6.  Troponin elevated to 46 likely elevated in the setting of type II NSTEMI and fluid overload.  Chest x-ray with pulmonary edema and pleural effusions.  The patient appears to be stable from respiratory standpoint on 4 L nasal cannula with no EKG changes from her hyperkalemia.  In an attempt to save the patient an additional dialysis line placement, interventional radiology was consulted for tunneled dialysis line placement as a more permanent solution and they agreed to add the patient on for today.   Nephrology was also consulted who recommended adding an additional 120 mg of Lasix and admitted to medicine.  Patient was then admitted to medicine.   Additional history obtained: -Additional history obtained from husband -External records from outside source obtained and reviewed including: Chart review including previous notes, labs, imaging, consultation notes   Lab Tests: -I ordered, reviewed, and interpreted labs.   The pertinent results include:   Labs Reviewed  COMPREHENSIVE METABOLIC PANEL - Abnormal; Notable for the following components:      Result Value   Potassium 5.7 (*)    CO2 20 (*)    Glucose, Bld 310 (*)    BUN 61 (*)    Creatinine, Ser 5.28 (*)    Calcium 8.0 (*)    Total Protein 5.7 (*)    Albumin 2.0 (*)    GFR, Estimated 10 (*)    Anion gap 16 (*)    All other components within normal limits  CBC WITH DIFFERENTIAL/PLATELET - Abnormal; Notable for the following components:   RBC 2.65 (*)    Hemoglobin 7.9 (*)    HCT 24.4 (*)    All other components within normal limits  TROPONIN I (HIGH SENSITIVITY) - Abnormal; Notable for the following components:   Troponin I (High Sensitivity) 46 (*)    All other components within normal limits  RESP PANEL BY RT-PCR (FLU A&B, COVID) ARPGX2  TROPONIN I (HIGH SENSITIVITY)      EKG   EKG Interpretation  Date/Time:  Tuesday June 16 2021 12:50:02 EST Ventricular Rate:  103 PR Interval:  135 QRS Duration: 80 QT Interval:  362 QTC Calculation: 474 R Axis:   125 Text Interpretation: Sinus tachycardia Confirmed by Tammi Boulier ([id number]) on 06/16/2021 3:23:58 PM  Imaging Studies ordered: I ordered imaging studies including CXR I independently visualized and interpreted imaging. I agree with the radiologist interpretation   Medicines ordered and prescription drug management: Meds ordered this encounter  Medications   ceFAZolin (ANCEF) IVPB 2g/100 mL premix    Order Specific Question:   Antibiotic  Indication:    Answer:   Surgical Prophylaxis   sodium zirconium cyclosilicate (LOKELMA) packet 10 g   furosemide (LASIX) 120 mg in dextrose 5 % 50 mL IVPB   morphine 4 MG/ML injection 3 mg   ondansetron (ZOFRAN) injection 4 mg    -I have reviewed the patients home medicines and have made adjustments as needed  Critical interventions Supplemental oxygen, diuresis  Consultations Obtained: I requested consultation with the nephrologist and interventional radiologist,  and discussed lab and imaging findings as well as pertinent plan - they recommend: Tunneled line placed today and admission to medicine with diuresis with 120 mg of Lasix   Cardiac Monitoring: The patient was maintained on a cardiac monitor.  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

## 2021-06-16 NOTE — Consult Note (Signed)
Chief Complaint: Patient was seen in consultation today for non tunneled vs tunneled dialysis catheter placement at the request of Dr Matilde Sprang  Referring Physician(s): Dr Laurena Bering  Supervising Physician: Markus Daft  Patient Status: [facility] - ED  History of Present Illness: Ann Robertson is a 31 y.o. female   Pt with CKD known over 1 yr She follows with Dr Hollie Salk She has had facial and extremity swelling for months Worsening SOB since last night Went to [facility] today to see MD They have sent to ED to initiate dialysis asap.  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   Neuropathy    Preeclampsia    STD (sexually transmitted disease)     Past Surgical History:  Procedure Laterality Date   CESAREAN SECTION     CESAREAN SECTION N/A 07/05/2013   Procedure: CESAREAN SECTION;  Surgeon: Emily Filbert, MD;  Location: [facility];  Service: Obstetrics;  Laterality: N/A;   CESAREAN SECTION N/A 07/25/2014   Procedure: CESAREAN SECTION;  Surgeon: Donnamae Jude, MD;  Location: [facility];  Service: Obstetrics;  Laterality: N/A;   CESAREAN SECTION N/A 11/21/2015   Procedure: CESAREAN SECTION;  Surgeon: Donnamae Jude, MD;  Location: [place];  Service: Obstetrics;  Laterality: N/A;   WISDOM TOOTH EXTRACTION      Allergies: Hydromorphone hcl  Medications: Prior to Admission medications   Medication Sig Start Date End Date Taking? Authorizing Provider  bisacodyl (DULCOLAX) 10 MG suppository Place 1 suppository (10 mg total) rectally daily as needed for moderate constipation. 02/21/21   Terrilee Croak, MD  Continuous Blood Gluc Sensor (DEXCOM G6 SENSOR) MISC Apply topically. 03/12/21   [provider]  Continuous Blood Gluc Sensor (DEXCOM G6 SENSOR) MISC  03/12/21   [provider]  Continuous Blood Gluc Transmit (DEXCOM G6  TRANSMITTER) MISC  02/22/21   [provider]  enalapril (VASOTEC) 5 MG tablet Take 5 mg by mouth 2 (two) times daily. 03/18/21   [provider]  insulin glargine (LANTUS SOLOSTAR) 100 UNIT/ML Solostar Pen Inject 2-5 Units into the skin daily as needed (high BGL). Inject 2-5 units as needed for High glucose levels 05/01/21   Aline August, MD  insulin lispro (HUMALOG) 100 UNIT/ML KwikPen Inject 2-5 Units into the skin 3 (three) times daily as needed (for a high BGL). Per sliding scale    [provider]  Insulin Pen Needle (PEN NEEDLES) 30G X 8 MM MISC 1 each by Does not apply route daily. E11.9 05/14/19   Renato Shin, MD  labetalol (NORMODYNE) 100 MG tablet Take 100 mg by mouth 2 (two) times daily. 03/26/21   [provider]  methocarbamol (ROBAXIN) 750 MG tablet Take 750 mg by mouth every 8 (eight) hours as needed for muscle spasms. 03/16/21   [provider]  moxifloxacin (VIGAMOX) 0.5 % ophthalmic solution Place 1 drop into both eyes daily.    [provider]  neomycin-polymyxin-dexameth (MAXITROL) 0.1 % OINT Place 1 application into both eyes daily.    [provider]  omeprazole (PRILOSEC) 20 MG capsule Take 20 mg by mouth daily.    [provider]  ondansetron (ZOFRAN) 4 MG tablet Take 1 tablet (4 mg total) by mouth every 8 (eight) hours as needed for nausea or vomiting. 06/10/20   Renato Shin, MD  oxyCODONE-acetaminophen (PERCOCET) 10-325 MG tablet Take 1 tablet by mouth every 4 (four) hours as needed for pain. 02/13/21   [provider]  potassium chloride SA (KLOR-CON M) 20 MEQ tablet Take 1 tablet (20 mEq total) by mouth 2 (two) times daily. 05/01/21   Aline August, MD  prednisoLONE acetate (PRED FORTE) 1 % ophthalmic suspension Place 1 drop into the left eye every 4 (four) hours. 04/23/21   [provider]  rosuvastatin (CRESTOR) 20 MG tablet Take 20 mg by mouth at bedtime. 03/16/21   [provider]  torsemide (DEMADEX) 20 MG tablet Take 2 tablets (40 mg total) by mouth 2 (two) times daily. 05/01/21 07/30/21  Aline August, MD  Vitamin D, Ergocalciferol, (DRISDOL) 1.25 MG (50000 UNIT) CAPS capsule Take 50,000 Units by mouth every Tuesday. Patient not taking: Reported on 04/18/2021    [provider]     Family History  Problem Relation Age of Onset   Stroke Mother    Hypertension Mother    Heart disease Mother    Kidney disease Mother    Hyperlipidemia Father    Diabetes Father     Social History   Socioeconomic History   Marital status: Single    Spouse name: Not on file   Number of children: Not on file   Years of education: Not on file   Highest education level: Not on file  Occupational History   Not on file  Tobacco Use   Smoking status: Every Day    Packs/day: 0.25    Years: 9.00    Pack years: 2.25    Types: Cigarettes   Smokeless tobacco: Never  Vaping Use   Vaping Use: Not on file  Substance and Sexual Activity   Alcohol use: Yes    Comment: occasional   Drug use: No   Sexual activity: Not Currently    Birth control/protection: None  Other Topics Concern   Not on file  Social History Narrative   Not on file   Social Determinants of Health   Financial Resource Strain: Not on file  Food Insecurity: Not on file  Transportation Needs: Not on file  Physical Activity: Not on file  Stress: Not on file  Social Connections: Not on file     Review of Systems: A 12 point ROS discussed and pertinent positives are indicated in the HPI above.  All other systems are negative.  Review of Systems  Constitutional:  Positive for activity change, appetite change and fatigue. Negative for fever.  Respiratory:  Positive for shortness of breath. Negative for wheezing.   Cardiovascular:  Positive for leg swelling. Negative for chest pain.  Gastrointestinal:  Negative for abdominal pain.  Neurological:  Positive for weakness.   Psychiatric/Behavioral:  Negative for behavioral problems and confusion.    Vital Signs: BP (!) 140/95    Pulse (!) 101    Temp 97.8 F (36.6 C) (Oral)    Resp 16    Ht 5\' 5"  (1.651 m)    Wt 169 lb (76.7 kg)    SpO2 96%    BMI 28.12 kg/m   Physical Exam Vitals reviewed.  HENT:     Mouth/Throat:     Mouth: Mucous membranes are moist.  Cardiovascular:     Rate and Rhythm: Normal rate and regular rhythm.     Heart sounds: Normal heart sounds.  Pulmonary:     Effort: Pulmonary effort is normal.     Breath sounds: Normal breath sounds. No wheezing.  Abdominal:     Palpations: Abdomen is soft.     Tenderness: There is no abdominal tenderness.  Musculoskeletal:        General: Swelling present. Normal range of motion.     Right lower leg: Edema present.     Left lower leg: Edema present.  Skin:    General: Skin is warm.  Neurological:     Mental Status: She is alert and oriented to person, place, and time.  Psychiatric:        Behavior: Behavior normal.    Imaging: DG Chest Portable 1 View  Result Date: 06/16/2021 CLINICAL DATA:  Dyspnea.  Shortness of breath. EXAM: PORTABLE CHEST 1 VIEW COMPARISON:  04/30/2021 FINDINGS: A moderate right pleural effusion is identified. Bilateral, multifocal airspace densities are noted within the left upper, left lower and right upper lobe. Atelectasis versus airspace disease noted within the right base. Visualized osseous structures are unremarkable. IMPRESSION: 1. Bilateral airspace opacities compatible with multifocal pneumonia. Followup PA and lateral chest X-ray is recommended in 3-4 weeks following trial of antibiotic therapy to ensure resolution and exclude underlying malignancy. 2. Moderate right pleural effusion. Electronically Signed   By: Kerby Moors M.D.   On: 06/16/2021 13:09    Labs:  CBC: Recent Labs    04/21/21 0401 04/21/21 2113 04/30/21 1443 04/30/21 1500 05/01/21 0044 05/28/21 1101 06/16/21 1314  WBC 6.9  --  6.2  --   9.0  --  8.2  HGB 7.4*   < > 6.7* 6.5* 10.1* 7.6* 7.9*  HCT 21.4*   < > 19.7* 19.0* 29.5*  --  24.4*  PLT 205  --  481*  --  518*  --  306   < > = values in this interval not displayed.    COAGS: No results for input(s): INR, APTT in the last 8760 hours.  BMP: Recent Labs    04/20/21 0330 04/21/21 0401 04/30/21 1443 04/30/21 1500 05/01/21 0044  NA 135 137 128* 129* 132*  K 4.0 4.1 3.9 4.0 3.2*  CL 104 104 95*  --  96*  CO2 22 24 24   --  27  GLUCOSE 84 187* 515*  --  224*  BUN 50* 42* 57*  --  54*  CALCIUM 8.6* 8.5* 7.6*  --  8.4*  CREATININE 3.50* 3.11* 3.37*  --  3.27*  GFRNONAA 17* 20* 18*  --  19*    LIVER FUNCTION TESTS: Recent Labs    04/20/21 0330 04/21/21 0401 04/30/21 1443 05/01/21 0044  BILITOT 0.6 1.2 0.7 0.6  AST 23 14* 29 33  ALT 20 12 30  33  ALKPHOS 84 58 104 126  PROT 4.8* 4.8* 4.8* 5.8*  ALBUMIN 1.6* 2.4* 2.0* 2.4*    TUMOR MARKERS: No results for input(s): AFPTM, CEA, CA199, CHROMGRNA in the last 8760 hours.  Assessment and Plan:  Acute on chronic renal failure Worsening renal function SOB and edema To ED for evaluation and initiate dialysis per Nephrology Scheduled now for non tunneled vs tunneled dialysis catheter placement Risks and benefits discussed with the patient including, but not limited to bleeding, infection, vascular injury, pneumothorax which may require chest tube placement, air embolism or even death  All of the patient's questions were answered, patient is agreeable to proceed. Consent signed and in chart.   Thank you for this interesting consult.  I greatly enjoyed meeting Latiya Navia and look forward to participating in their care.  A copy of this report was sent to the requesting  provider on this date.  Electronically Signed: Lavonia Drafts, PA-C 06/16/2021, 1:57 PM   I spent a total of 20 Minutes    in face to face in clinical consultation, greater than 50% of which was counseling/coordinating care for non  tunn vs tunneled dialysis catheter placement

## 2021-06-17 ENCOUNTER — Inpatient Hospital Stay (HOSPITAL_COMMUNITY): Payer: Medicaid Other

## 2021-06-17 DIAGNOSIS — Z8249 Family history of ischemic heart disease and other diseases of the circulatory system: Secondary | ICD-10-CM | POA: Diagnosis not present

## 2021-06-17 DIAGNOSIS — E8770 Fluid overload, unspecified: Secondary | ICD-10-CM | POA: Diagnosis present

## 2021-06-17 DIAGNOSIS — I120 Hypertensive chronic kidney disease with stage 5 chronic kidney disease or end stage renal disease: Secondary | ICD-10-CM | POA: Diagnosis present

## 2021-06-17 DIAGNOSIS — N186 End stage renal disease: Secondary | ICD-10-CM

## 2021-06-17 DIAGNOSIS — Z8619 Personal history of other infectious and parasitic diseases: Secondary | ICD-10-CM | POA: Diagnosis not present

## 2021-06-17 DIAGNOSIS — E1022 Type 1 diabetes mellitus with diabetic chronic kidney disease: Secondary | ICD-10-CM | POA: Diagnosis present

## 2021-06-17 DIAGNOSIS — E1010 Type 1 diabetes mellitus with ketoacidosis without coma: Secondary | ICD-10-CM | POA: Diagnosis present

## 2021-06-17 DIAGNOSIS — F17210 Nicotine dependence, cigarettes, uncomplicated: Secondary | ICD-10-CM | POA: Diagnosis present

## 2021-06-17 DIAGNOSIS — N2581 Secondary hyperparathyroidism of renal origin: Secondary | ICD-10-CM | POA: Diagnosis present

## 2021-06-17 DIAGNOSIS — Z992 Dependence on renal dialysis: Secondary | ICD-10-CM

## 2021-06-17 DIAGNOSIS — Z01810 Encounter for preprocedural cardiovascular examination: Secondary | ICD-10-CM | POA: Diagnosis not present

## 2021-06-17 DIAGNOSIS — Z9889 Other specified postprocedural states: Secondary | ICD-10-CM | POA: Diagnosis not present

## 2021-06-17 DIAGNOSIS — J9601 Acute respiratory failure with hypoxia: Secondary | ICD-10-CM | POA: Diagnosis present

## 2021-06-17 DIAGNOSIS — Z83438 Family history of other disorder of lipoprotein metabolism and other lipidemia: Secondary | ICD-10-CM | POA: Diagnosis not present

## 2021-06-17 DIAGNOSIS — D631 Anemia in chronic kidney disease: Secondary | ICD-10-CM | POA: Diagnosis present

## 2021-06-17 DIAGNOSIS — Z79899 Other long term (current) drug therapy: Secondary | ICD-10-CM | POA: Diagnosis not present

## 2021-06-17 DIAGNOSIS — E10649 Type 1 diabetes mellitus with hypoglycemia without coma: Secondary | ICD-10-CM | POA: Diagnosis present

## 2021-06-17 DIAGNOSIS — J90 Pleural effusion, not elsewhere classified: Secondary | ICD-10-CM | POA: Diagnosis present

## 2021-06-17 DIAGNOSIS — E7800 Pure hypercholesterolemia, unspecified: Secondary | ICD-10-CM | POA: Diagnosis present

## 2021-06-17 DIAGNOSIS — R0602 Shortness of breath: Secondary | ICD-10-CM | POA: Diagnosis present

## 2021-06-17 DIAGNOSIS — E875 Hyperkalemia: Secondary | ICD-10-CM | POA: Diagnosis present

## 2021-06-17 DIAGNOSIS — Z841 Family history of disorders of kidney and ureter: Secondary | ICD-10-CM | POA: Diagnosis not present

## 2021-06-17 DIAGNOSIS — Z20822 Contact with and (suspected) exposure to covid-19: Secondary | ICD-10-CM | POA: Diagnosis present

## 2021-06-17 DIAGNOSIS — E1040 Type 1 diabetes mellitus with diabetic neuropathy, unspecified: Secondary | ICD-10-CM | POA: Diagnosis present

## 2021-06-17 DIAGNOSIS — N179 Acute kidney failure, unspecified: Secondary | ICD-10-CM | POA: Diagnosis present

## 2021-06-17 DIAGNOSIS — E103599 Type 1 diabetes mellitus with proliferative diabetic retinopathy without macular edema, unspecified eye: Secondary | ICD-10-CM | POA: Diagnosis present

## 2021-06-17 DIAGNOSIS — J811 Chronic pulmonary edema: Secondary | ICD-10-CM | POA: Diagnosis present

## 2021-06-17 DIAGNOSIS — Z833 Family history of diabetes mellitus: Secondary | ICD-10-CM | POA: Diagnosis not present

## 2021-06-17 LAB — GLUCOSE, CAPILLARY
Glucose-Capillary: 389 mg/dL — ABNORMAL HIGH (ref 70–99)
Glucose-Capillary: 285 mg/dL — ABNORMAL HIGH (ref 70–99)
Glucose-Capillary: 276 mg/dL — ABNORMAL HIGH (ref 70–99)
Glucose-Capillary: 224 mg/dL — ABNORMAL HIGH (ref 70–99)
Glucose-Capillary: 170 mg/dL — ABNORMAL HIGH (ref 70–99)
Glucose-Capillary: 150 mg/dL — ABNORMAL HIGH (ref 70–99)
Glucose-Capillary: 147 mg/dL — ABNORMAL HIGH (ref 70–99)
Glucose-Capillary: 139 mg/dL — ABNORMAL HIGH (ref 70–99)
Glucose-Capillary: 154 mg/dL — ABNORMAL HIGH (ref 70–99)

## 2021-06-17 LAB — CBC
HCT: 22.7 % — ABNORMAL LOW (ref 36.0–46.0)
Hemoglobin: 7.3 g/dL — ABNORMAL LOW (ref 12.0–15.0)
MCH: 29 pg (ref 26.0–34.0)
MCHC: 32.2 g/dL (ref 30.0–36.0)
MCV: 90.1 fL (ref 80.0–100.0)
Platelets: 316 10*3/uL (ref 150–400)
RBC: 2.52 MIL/uL — ABNORMAL LOW (ref 3.87–5.11)
RDW: 14.5 % (ref 11.5–15.5)
WBC: 8.8 10*3/uL (ref 4.0–10.5)
nRBC: 0 % (ref 0.0–0.2)

## 2021-06-17 LAB — BASIC METABOLIC PANEL
Anion gap: 14 (ref 5–15)
Anion gap: 10 (ref 5–15)
Anion gap: 13 (ref 5–15)
Anion gap: 8 (ref 5–15)
Anion gap: 6 (ref 5–15)
BUN: 41 mg/dL — ABNORMAL HIGH (ref 6–20)
BUN: 31 mg/dL — ABNORMAL HIGH (ref 6–20)
BUN: 35 mg/dL — ABNORMAL HIGH (ref 6–20)
BUN: 35 mg/dL — ABNORMAL HIGH (ref 6–20)
BUN: 35 mg/dL — ABNORMAL HIGH (ref 6–20)
CO2: 22 mmol/L (ref 22–32)
CO2: 24 mmol/L (ref 22–32)
CO2: 23 mmol/L (ref 22–32)
CO2: 24 mmol/L (ref 22–32)
CO2: 27 mmol/L (ref 22–32)
Calcium: 7.8 mg/dL — ABNORMAL LOW (ref 8.9–10.3)
Calcium: 7.6 mg/dL — ABNORMAL LOW (ref 8.9–10.3)
Calcium: 7.7 mg/dL — ABNORMAL LOW (ref 8.9–10.3)
Calcium: 7.5 mg/dL — ABNORMAL LOW (ref 8.9–10.3)
Calcium: 7.5 mg/dL — ABNORMAL LOW (ref 8.9–10.3)
Chloride: 100 mmol/L (ref 98–111)
Chloride: 101 mmol/L (ref 98–111)
Chloride: 101 mmol/L (ref 98–111)
Chloride: 102 mmol/L (ref 98–111)
Chloride: 104 mmol/L (ref 98–111)
Creatinine, Ser: 3.67 mg/dL — ABNORMAL HIGH (ref 0.44–1.00)
Creatinine, Ser: 2.84 mg/dL — ABNORMAL HIGH (ref 0.44–1.00)
Creatinine, Ser: 3.35 mg/dL — ABNORMAL HIGH (ref 0.44–1.00)
Creatinine, Ser: 3.25 mg/dL — ABNORMAL HIGH (ref 0.44–1.00)
Creatinine, Ser: 3.46 mg/dL — ABNORMAL HIGH (ref 0.44–1.00)
GFR, Estimated: 16 mL/min — ABNORMAL LOW (ref >60)
GFR, Estimated: 22 mL/min — ABNORMAL LOW (ref >60)
GFR, Estimated: 18 mL/min — ABNORMAL LOW (ref >60)
GFR, Estimated: 19 mL/min — ABNORMAL LOW (ref >60)
GFR, Estimated: 17 mL/min — ABNORMAL LOW (ref >60)
Glucose, Bld: 292 mg/dL — ABNORMAL HIGH (ref 70–99)
Glucose, Bld: 227 mg/dL — ABNORMAL HIGH (ref 70–99)
Glucose, Bld: 241 mg/dL — ABNORMAL HIGH (ref 70–99)
Glucose, Bld: 142 mg/dL — ABNORMAL HIGH (ref 70–99)
Glucose, Bld: 167 mg/dL — ABNORMAL HIGH (ref 70–99)
Potassium: 4 mmol/L (ref 3.5–5.1)
Potassium: 3.6 mmol/L (ref 3.5–5.1)
Potassium: 3.6 mmol/L (ref 3.5–5.1)
Potassium: 3.5 mmol/L (ref 3.5–5.1)
Potassium: 3.6 mmol/L (ref 3.5–5.1)
Sodium: 136 mmol/L (ref 135–145)
Sodium: 135 mmol/L (ref 135–145)
Sodium: 137 mmol/L (ref 135–145)
Sodium: 134 mmol/L — ABNORMAL LOW (ref 135–145)
Sodium: 137 mmol/L (ref 135–145)

## 2021-06-17 LAB — BLOOD GAS, VENOUS
Acid-base deficit: 8.9 mmol/L — ABNORMAL HIGH (ref 0.0–2.0)
Bicarbonate: 16.2 mmol/L — ABNORMAL LOW (ref 20.0–28.0)
O2 Saturation: 83.9 %
Patient temperature: 37
pCO2, Ven: 33.9 mmHg — ABNORMAL LOW (ref 44.0–60.0)
pH, Ven: 7.301 (ref 7.250–7.430)
pO2, Ven: 55.2 mmHg — ABNORMAL HIGH (ref 32.0–45.0)

## 2021-06-17 LAB — BETA-HYDROXYBUTYRIC ACID
Beta-Hydroxybutyric Acid: 4.58 mmol/L — ABNORMAL HIGH (ref 0.05–0.27)
Beta-Hydroxybutyric Acid: 3.08 mmol/L — ABNORMAL HIGH (ref 0.05–0.27)
Beta-Hydroxybutyric Acid: 0.9 mmol/L — ABNORMAL HIGH (ref 0.05–0.27)
Beta-Hydroxybutyric Acid: 0.09 mmol/L (ref 0.05–0.27)

## 2021-06-17 LAB — COMPREHENSIVE METABOLIC PANEL
ALT: 18 U/L (ref 0–44)
AST: 19 U/L (ref 15–41)
Albumin: 1.8 g/dL — ABNORMAL LOW (ref 3.5–5.0)
Alkaline Phosphatase: 130 U/L — ABNORMAL HIGH (ref 38–126)
Anion gap: 17 — ABNORMAL HIGH (ref 5–15)
BUN: 52 mg/dL — ABNORMAL HIGH (ref 6–20)
CO2: 17 mmol/L — ABNORMAL LOW (ref 22–32)
Calcium: 7.6 mg/dL — ABNORMAL LOW (ref 8.9–10.3)
Chloride: 101 mmol/L (ref 98–111)
Creatinine, Ser: 4.52 mg/dL — ABNORMAL HIGH (ref 0.44–1.00)
GFR, Estimated: 13 mL/min — ABNORMAL LOW (ref >60)
Glucose, Bld: 378 mg/dL — ABNORMAL HIGH (ref 70–99)
Potassium: 4.8 mmol/L (ref 3.5–5.1)
Sodium: 135 mmol/L (ref 135–145)
Total Bilirubin: 0.8 mg/dL (ref 0.3–1.2)
Total Protein: 5.4 g/dL — ABNORMAL LOW (ref 6.5–8.1)

## 2021-06-17 MED ORDER — DEXTROSE 50 % IV SOLN: 0.0000 mL | INTRAVENOUS | Status: DC | PRN

## 2021-06-17 MED ORDER — NEPRO/CARBSTEADY PO LIQD
237.0000 mL | Freq: Two times a day (BID) | ORAL | Status: DC
  Administered 2021-06-17 – 2021-06-19 (×3): 237 mL via ORAL

## 2021-06-17 MED ORDER — DIPHENHYDRAMINE HCL 50 MG/ML IJ SOLN
25.0000 mg | Freq: Once | INTRAMUSCULAR | Status: DC
  Filled 2021-06-17: qty 1

## 2021-06-17 MED ORDER — INSULIN REGULAR(HUMAN) IN NACL 100-0.9 UT/100ML-% IV SOLN
INTRAVENOUS | Status: AC
  Administered 2021-06-17: 6 [IU]/h via INTRAVENOUS
  Filled 2021-06-17 (×2): qty 100

## 2021-06-17 MED ORDER — DARBEPOETIN ALFA 100 MCG/0.5ML IJ SOSY
100.0000 ug | PREFILLED_SYRINGE | INTRAMUSCULAR | Status: DC
  Administered 2021-06-17: 100 ug via INTRAVENOUS
  Filled 2021-06-17: qty 0.5

## 2021-06-17 MED ORDER — OXYCODONE-ACETAMINOPHEN 5-325 MG PO TABS
1.0000 | ORAL_TABLET | ORAL | Status: DC | PRN
  Administered 2021-06-17 – 2021-06-20 (×10): 1 via ORAL
  Filled 2021-06-17 (×10): qty 1

## 2021-06-17 MED ORDER — RENA-VITE PO TABS
1.0000 | ORAL_TABLET | Freq: Every day | ORAL | Status: DC
  Administered 2021-06-18 – 2021-06-19 (×2): 1 via ORAL
  Filled 2021-06-17 (×3): qty 1

## 2021-06-17 MED ORDER — INSULIN DETEMIR 100 UNIT/ML ~~LOC~~ SOLN
4.0000 [IU] | Freq: Every day | SUBCUTANEOUS | Status: DC
  Administered 2021-06-17 – 2021-06-18 (×2): 4 [IU] via SUBCUTANEOUS
  Filled 2021-06-17 (×2): qty 0.04

## 2021-06-17 MED ORDER — INSULIN ASPART 100 UNIT/ML IJ SOLN: 3.0000 [IU] | Freq: Once | INTRAMUSCULAR | Status: DC

## 2021-06-17 MED ORDER — SODIUM CHLORIDE 0.9 % IV SOLN
125.0000 mg | Freq: Every day | INTRAVENOUS | Status: DC
  Administered 2021-06-17 – 2021-06-20 (×3): 125 mg via INTRAVENOUS
  Filled 2021-06-17 (×4): qty 10

## 2021-06-17 MED ORDER — LEVOTHYROXINE SODIUM 25 MCG PO TABS
25.0000 ug | ORAL_TABLET | Freq: Every day | ORAL | Status: DC
  Administered 2021-06-18 – 2021-06-20 (×2): 25 ug via ORAL
  Filled 2021-06-17 (×2): qty 1

## 2021-06-17 NOTE — Progress Notes (Signed)
Interval progress note   Beta hydroxybutyric acid elevated 4.5, anion gap 12 and repeat 17, and low bicarb 21 and repeat 17; UA still pending. Last CBG 260 and patient has not received any insulin yet. Pt is asymptomatic at this time. Advised RN to administer Novolog 3 units, however pt refusing, stating "I know my body, and I know that my sugar will tank" saying she will take insulin at 8 am after breakfast. Repeat CBG 378, advised patient to accept 5 units of Novolog, but continues to refuse. She even states that she has her personal insulin with her and can administer later if she wants or feels the need to. Advised her that she cannot self-administer insulin while in the hospital as this can be fatal; she is persistent in her beliefs and is not open to taking insulin at this time. It may be feasible to contact confiscate her personal insulin for the time being.    At this time, given increased BHA, anion gap and low bicarb (pending UA, and VBG pending), will proceed with Endotool for DKA, but given concerns for fluid overload, talked to Dr Candiss Norse with nephrology, who recommends no fluids at this time. Will continue to monitor patient closely, and advised her to allow [place] to treat her medical problems, and to not self-administer any medications. She is also under the impression that she will be DC'd today; she had first HD yesterday with only 1L UF and still needs to establish with OP HD site.   Will have a detailed conversation with patient about timeline and expectations during rounds today.

## 2021-06-17 NOTE — Care Management (Signed)
°  Transition of Care [facility]) Screening Note   Patient Details  Name: Ann Robertson Date of Birth: 1991/02/06   Transition of Care [facility]) CM/SW Contact:    Carles Collet, RN Phone Number: 06/17/2021, 8:06 AM    Transition of Care Department [facility]) has reviewed patient and no TOC needs have been identified at this time. We will continue to monitor patient advancement through interdisciplinary progression rounds. If new patient transition needs arise, please place a TOC consult.

## 2021-06-17 NOTE — Progress Notes (Addendum)
HD#0 SUBJECTIVE:  Patient Summary: Ann Robertson is a 31 y.o. with a pertinent PMH of type 1 diabetes c/p nephropathy, anemia of chronic disease, proliferative diabetic retinopathy, who presented with hypoxia and volume overload and admitted for Acute hypoxic respiratory failure 2/2 to volume overload from ESRD.   Overnight Events: Patient underwent first HD session last night with 1L off.  She continued to have 9/10 pain at [facility] site.  CBG in 300s, BHO elevated and Anion gap elevated indicating DKA.  Patient went back to HD this AM.  Interim History: Patient seen in HD this AM.  She has several questions about being in DKA and is frustrated at having to be NPO.  We discussed that it is not advisable to eat during dialysis and that she would be receiving IV insulin after HD to help with DKA.  We agreed that she would be allowed to eat while on endotool.  She requests that her pain medications be scheduled for every 4 hours rather than 6 hours. No other concerns at this time.  OBJECTIVE:  Vital Signs: Vitals:   06/16/21 2053 06/17/21 0012 06/17/21 0425 06/17/21 0500  BP: (!) 146/94 (!) 146/89 129/89   Pulse: (!) 104 (!) 102 100   Resp: 18 19 16    Temp: 97.7 F (36.5 C) 97.9 F (36.6 C) 98.5 F (36.9 C)   TempSrc: Oral Oral Oral   SpO2: 96% 98% 98%   Weight:    75.9 kg  Height:       Supplemental O2: Nasal Cannula SpO2: 98 % O2 Flow Rate (L/min): 3 L/min  Filed Weights   06/16/21 1248 06/17/21 0500  Weight: 76.7 kg 75.9 kg     Intake/Output Summary (Last 24 hours) at 06/17/2021 8101 Last data filed at 06/17/2021 0200 Gross per 24 hour  Intake 280 ml  Output 1000 ml  Net -720 ml   Net IO Since Admission: -720 mL [06/17/21 0727]  Physical Exam: Constitutional: Chronically ill appearing seen in dialysis unit, in no acute distress HENT: normocephalic atraumatic, mucous membranes moist Eyes: conjunctiva non-erythematous Neck: supple, right tunneled catheter in  place Cardiovascular: tachycardia and sinus rhythm, no m/r/g Pulmonary/Chest: regular work of breathing on 3 L [place], lungs clear to auscultation bilaterally Abdominal: soft, non-tender, non-distended MSK: normal bulk and tone, 2+ pitting edema to knees bilaterally, pain in legs with pressure Neurological: alert & oriented x 3 Skin: warm and dry Psych: normal mood and affect  Patient Lines/Drains/Airways Status     Active Line/Drains/Airways     Name Placement date Placement time Site Days   Peripheral IV 06/16/21 Right;Posterior Hand 06/16/21  1314  Hand  1   Hemodialysis Catheter Right Subclavian Double lumen Permanent (Tunneled) 06/16/21  1611  Subclavian  1            Pertinent Labs: CBC Latest Ref Rng & Units 06/17/2021 06/16/2021 05/28/2021  WBC 4.0 - 10.5 K/uL 8.8 8.2 -  Hemoglobin 12.0 - 15.0 g/dL 7.3(L) 7.9(L) 7.6(L)  Hematocrit 36.0 - 46.0 % 22.7(L) 24.4(L) -  Platelets 150 - 400 K/uL 316 306 -    CMP Latest Ref Rng & Units 06/17/2021 06/16/2021 06/16/2021  Glucose 70 - 99 mg/dL 378(H) 260(H) 310(H)  BUN 6 - 20 mg/dL 52(H) 44(H) 61(H)  Creatinine 0.44 - 1.00 mg/dL 4.52(H) 3.88(H) 5.28(H)  Sodium 135 - 145 mmol/L 135 134(L) 137  Potassium 3.5 - 5.1 mmol/L 4.8 4.7 5.7(H)  Chloride 98 - 111 mmol/L 101 101 101  CO2 22 -  32 mmol/L 17(L) 21(L) 20(L)  Calcium 8.9 - 10.3 mg/dL 7.6(L) 7.2(L) 8.0(L)  Total Protein 6.5 - 8.1 g/dL 5.4(L) - 5.7(L)  Total Bilirubin 0.3 - 1.2 mg/dL 0.8 - 1.1  Alkaline Phos 38 - 126 U/L 130(H) - 124  AST 15 - 41 U/L 19 - 20  ALT 0 - 44 U/L 18 - 22    Recent Labs    06/16/21 2052 06/17/21 0514  GLUCAP 270* 389*     Pertinent Imaging: IR Fluoro Guide CV Line Right  Result Date: 06/16/2021 INDICATION: 31 year old with acute on chronic renal failure. Patient needs urgent dialysis. EXAM: FLUOROSCOPIC AND ULTRASOUND GUIDED PLACEMENT OF A TUNNELED DIALYSIS CATHETER Physician: Stephan Minister. Anselm Pancoast, MD MEDICATIONS: Ancef 2 g; The antibiotic was administered  within an appropriate time interval prior to skin puncture. ANESTHESIA/SEDATION: Moderate (conscious) sedation was employed during this procedure. A total of Versed 0.5mg  and fentanyl 12.5 mcg was administered intravenously at the order of the provider performing the procedure. Total intra-service moderate sedation time: 27 minutes. Patient's level of consciousness and vital signs were monitored continuously by radiology nurse throughout the procedure under the supervision of the provider performing the procedure. FLUOROSCOPY TIME:  Fluoroscopy Time: 2 minutes, 6 mGy COMPLICATIONS: None immediate. PROCEDURE: Informed consent was obtained for placement of a tunneled dialysis catheter. The patient was placed supine on the interventional table. Ultrasound confirmed a patent right internal jugular vein. Ultrasound image obtained for documentation. The right neck and chest was prepped and draped in a sterile fashion. Maximal barrier sterile technique was utilized including caps, mask, sterile gowns, sterile gloves, sterile drape, hand hygiene and skin antiseptic. The right neck was anesthetized with 1% lidocaine. A small incision was made with #11 blade scalpel. A 21 gauge needle directed into the right internal jugular vein with ultrasound guidance. A micropuncture dilator set was placed. A 19 cm tip to cuff Palindrome catheter was selected. The skin below the right clavicle was anesthetized and a small incision was made with an #11 blade scalpel. A subcutaneous tunnel was formed to the vein dermatotomy site. The catheter was brought through the tunnel. The vein dermatotomy site was dilated to accommodate a peel-away sheath. The catheter was placed through the peel-away sheath and directed into the central venous structures. The tip of the catheter was placed at superior cavoatrial junction with fluoroscopy. Fluoroscopic images were obtained for documentation. Both lumens were found to aspirate and flush well. The  proper amount of heparin was flushed in both lumens. The vein dermatotomy site was closed using a single layer of absorbable suture and Dermabond. Gel-Foam was placed in the subcutaneous tract. The catheter was secured to the skin using Prolene suture. IMPRESSION: Successful placement of a right jugular tunneled dialysis catheter using ultrasound and fluoroscopic guidance. Electronically Signed   By: Markus Daft M.D.   On: 06/16/2021 20:12   IR US Guide Vasc Access Right  Result Date: 06/16/2021 INDICATION: 32 year old with acute on chronic renal failure. Patient needs urgent dialysis. EXAM: FLUOROSCOPIC AND ULTRASOUND GUIDED PLACEMENT OF A TUNNELED DIALYSIS CATHETER Physician: Stephan Minister. Anselm Pancoast, MD MEDICATIONS: Ancef 2 g; The antibiotic was administered within an appropriate time interval prior to skin puncture. ANESTHESIA/SEDATION: Moderate (conscious) sedation was employed during this procedure. A total of Versed 0.5mg  and fentanyl 12.5 mcg was administered intravenously at the order of the provider performing the procedure. Total intra-service moderate sedation time: 27 minutes. Patient's level of consciousness and vital signs were monitored continuously by radiology nurse throughout the procedure  under the supervision of the provider performing the procedure. FLUOROSCOPY TIME:  Fluoroscopy Time: 2 minutes, 6 mGy COMPLICATIONS: None immediate. PROCEDURE: Informed consent was obtained for placement of a tunneled dialysis catheter. The patient was placed supine on the interventional table. Ultrasound confirmed a patent right internal jugular vein. Ultrasound image obtained for documentation. The right neck and chest was prepped and draped in a sterile fashion. Maximal barrier sterile technique was utilized including caps, mask, sterile gowns, sterile gloves, sterile drape, hand hygiene and skin antiseptic. The right neck was anesthetized with 1% lidocaine. A small incision was made with #11 blade scalpel. A 21 gauge  needle directed into the right internal jugular vein with ultrasound guidance. A micropuncture dilator set was placed. A 19 cm tip to cuff Palindrome catheter was selected. The skin below the right clavicle was anesthetized and a small incision was made with an #11 blade scalpel. A subcutaneous tunnel was formed to the vein dermatotomy site. The catheter was brought through the tunnel. The vein dermatotomy site was dilated to accommodate a peel-away sheath. The catheter was placed through the peel-away sheath and directed into the central venous structures. The tip of the catheter was placed at superior cavoatrial junction with fluoroscopy. Fluoroscopic images were obtained for documentation. Both lumens were found to aspirate and flush well. The proper amount of heparin was flushed in both lumens. The vein dermatotomy site was closed using a single layer of absorbable suture and Dermabond. Gel-Foam was placed in the subcutaneous tract. The catheter was secured to the skin using Prolene suture. IMPRESSION: Successful placement of a right jugular tunneled dialysis catheter using ultrasound and fluoroscopic guidance. Electronically Signed   By: Markus Daft M.D.   On: 06/16/2021 20:12   DG Chest Portable 1 View  Result Date: 06/16/2021 CLINICAL DATA:  Dyspnea.  Shortness of breath. EXAM: PORTABLE CHEST 1 VIEW COMPARISON:  04/30/2021 FINDINGS: A moderate right pleural effusion is identified. Bilateral, multifocal airspace densities are noted within the left upper, left lower and right upper lobe. Atelectasis versus airspace disease noted within the right base. Visualized osseous structures are unremarkable. IMPRESSION: 1. Bilateral airspace opacities compatible with multifocal pneumonia. Followup PA and lateral chest X-ray is recommended in 3-4 weeks following trial of antibiotic therapy to ensure resolution and exclude underlying malignancy. 2. Moderate right pleural effusion. Electronically Signed   By: Kerby Moors M.D.   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 with a pertinent PMH of type 1 diabetes c/p nephropathy, anemia of chronic disease, proliferative diabetic retinopathy, who presented with hypoxia and volume overload and admitted for Acute hypoxic respiratory failure 2/2 to volume overload from ESRD.   Plan: #Acute hypoxic respiratory failure #ESRD on HD #Hyperkalemia- resolved Patient remains volume overloaded. She underwent HD last night and this morning with total of 3L off. Patient states that breathing is comfortable and she is saturating well on 3L. Nephrology on board, appreciate help.  Vein mapping Ultrasound to be completed today and vascular surgery consulted to help with permanent assess.  Plan for repeat HD tomorrow. -Appreciate nephrology recommendations -IV Lasix 120 mg -Vascular surgery consulted -Strict I/O -Daily weight  -BMP q 4 hrs   #Anion gap metabolic acidosis Anion  gap initially improved after dialysis last night but BMP showed AG of 17 overnight with BHO of 4.58. Likely that patient in early DKA due to not receiving insulin.  She was placed IV insulin following HD this AM. BMP with decreased AG of 10 following HD.  - repeat BMP q 4 hrs   #Anemia of chronic disease Hemoglobin of 7.9. TIBC low with hypoproliferative retuclicytes.  Nephrology plan to give IV iron and aranesp once weekly -Iron and EPO per nephrology -Transfuse goal > 7   #Type 1 diabetes mellitus c/p neuropathy Following Dr. Janese Banks, last visit 1 month ago, could not see notes in the chart.  A1c 6.92 months ago.  Currently taking 4 units of Lantus with sliding scale Humalog.  She did reports hypoglycemia events likely due to  worsening of her CKD. Prior history of DKA. -will transition to long-acting plus sensitive sliding scale once transitioned of of IV insulin    #HLD -Resume Crestor   #HTN Home medications includes labetalol and enalapril. HR elevated in the 100s. -Resume labetalol -Hold enalapril  Best Practice: Diet: Renal diet IVF: Fluids: none in setting of volume overload from ESRD VTE: heparin injection 5,000 Units Start: 06/16/21 2200 Code: Full AB: none DISPO: Anticipated discharge  3-4  to Home pending permanent dialysis access and outpatient HD established.  Signature: Daleen Bo. Mitchelle Goerner, D.O.  Internal Medicine Resident, PGY-1 [facility]  Pager: [phone] 7:27 AM, 06/17/2021   Please contact the on call pager after 5 pm and on weekends at [phone].

## 2021-06-17 NOTE — Progress Notes (Signed)
Upper extremity vein mapping has been completed.   Preliminary results in CV Proc.   Ann Robertson 06/17/2021 2:46 PM

## 2021-06-17 NOTE — Progress Notes (Signed)
[facility] Progress Note   Subjective:   seen on HD #2.  DKA dx so NPO.  C/o chronic and acute pain.  Tolerated HD fine yesterday.  ^ UOP with IV lasix overnight reported.  No new symptoms.   Objective Vitals:   06/17/21 0012 06/17/21 0425 06/17/21 0500 06/17/21 0732  BP: (!) 146/89 129/89  134/82  Pulse: (!) 102 100  (!) 105  Resp: 19 16  11   Temp: 97.9 F (36.6 C) 98.5 F (36.9 C)  99.3 F (37.4 C)  TempSrc: Oral Oral  Skin  SpO2: 98% 98%  99%  Weight:   75.9 kg 75.3 kg  Height:       Physical Exam General: comfortable in bed Heart:tachycardic and regular, no rub Lungs: normal WOB  Abdomen:  soft, nontender Extremities: 2+ diffuse edema Dialysis Access:  RIJ TDC with some blood on dressing which will be changed today  Additional Objective Labs: Basic Metabolic Panel: Recent Labs  Lab 06/16/21 1314 06/16/21 2004 06/17/21 0341  NA 137 134* 135  K 5.7* 4.7 4.8  CL 101 101 101  CO2 20* 21* 17*  GLUCOSE 310* 260* 378*  BUN 61* 44* 52*  CREATININE 5.28* 3.88* 4.52*  CALCIUM 8.0* 7.2* 7.6*   Liver Function Tests: Recent Labs  Lab 06/16/21 1314 06/17/21 0341  AST 20 19  ALT 22 18  ALKPHOS 124 130*  BILITOT 1.1 0.8  PROT 5.7* 5.4*  ALBUMIN 2.0* 1.8*   No results for input(s): LIPASE, AMYLASE in the last 168 hours. CBC: Recent Labs  Lab 06/16/21 1314 06/17/21 0341  WBC 8.2 8.8  NEUTROABS 6.4  --   HGB 7.9* 7.3*  HCT 24.4* 22.7*  MCV 92.1 90.1  PLT 306 316   Blood Culture    Component Value Date/Time   SDES  04/19/2021 1024    BLOOD BLOOD RIGHT HAND Performed at [facility], [place] [address], [place], [place] [address]    SDES  04/19/2021 1024    BLOOD BLOOD RIGHT WRIST Performed at [facility] [address], [place], [place] [address]    [place]  04/19/2021 1024    BOTTLES DRAWN AEROBIC ONLY Blood Culture adequate volume Performed at [facility] [address], [place], [place] [address]    [place]  04/19/2021 1024    BOTTLES DRAWN AEROBIC ONLY Blood Culture adequate volume Performed at [facility] [address], [place], [place] [address]    CULT  04/19/2021 1024    NO GROWTH 5 DAYS Performed at [facility] [address], [place], [place] [address]    CULT  04/19/2021 1024    NO GROWTH 5 DAYS Performed at [facility] [address] [place], [place] [address]    REPTSTATUS 04/24/2021 FINAL 04/19/2021 1024   REPTSTATUS 04/24/2021 FINAL 04/19/2021 1024    Cardiac Enzymes: No results for input(s): CKTOTAL, CKMB, CKMBINDEX, TROPONINI in the last 168 hours. CBG: Recent Labs  Lab 06/16/21 2052 06/17/21 0514  GLUCAP 270* 389*   Iron Studies:  Recent Labs    06/16/21 2004  IRON 38  TIBC 174*  FERRITIN 652*   @[email or web address] @ Studies/Results: IR Fluoro Guide CV Line Right  Result Date: 06/16/2021 INDICATION: 31 year old with acute on chronic renal failure. Patient needs urgent dialysis. EXAM: FLUOROSCOPIC AND ULTRASOUND GUIDED PLACEMENT OF A TUNNELED DIALYSIS CATHETER Physician: Stephan Minister. Anselm Pancoast, MD MEDICATIONS: Ancef 2 g; The antibiotic was administered within an appropriate  time interval prior to skin puncture. ANESTHESIA/SEDATION: Moderate (conscious) sedation was employed during this procedure. A total of Versed 0.5mg  and fentanyl 12.5 mcg was administered intravenously at the order of the provider performing the procedure. Total intra-service moderate sedation time: 27 minutes. Patient's level of consciousness and vital signs were monitored continuously by radiology nurse throughout the procedure under the supervision of the provider performing the procedure. FLUOROSCOPY TIME:  Fluoroscopy Time: 2 minutes, 6 mGy COMPLICATIONS: None immediate. PROCEDURE: Informed consent was obtained for placement of a tunneled dialysis catheter. The patient was placed supine on the interventional table. Ultrasound  confirmed a patent right internal jugular vein. Ultrasound image obtained for documentation. The right neck and chest was prepped and draped in a sterile fashion. Maximal barrier sterile technique was utilized including caps, mask, sterile gowns, sterile gloves, sterile drape, hand hygiene and skin antiseptic. The right neck was anesthetized with 1% lidocaine. A small incision was made with #11 blade scalpel. A 21 gauge needle directed into the right internal jugular vein with ultrasound guidance. A micropuncture dilator set was placed. A 19 cm tip to cuff Palindrome catheter was selected. The skin below the right clavicle was anesthetized and a small incision was made with an #11 blade scalpel. A subcutaneous tunnel was formed to the vein dermatotomy site. The catheter was brought through the tunnel. The vein dermatotomy site was dilated to accommodate a peel-away sheath. The catheter was placed through the peel-away sheath and directed into the central venous structures. The tip of the catheter was placed at superior cavoatrial junction with fluoroscopy. Fluoroscopic images were obtained for documentation. Both lumens were found to aspirate and flush well. The proper amount of heparin was flushed in both lumens. The vein dermatotomy site was closed using a single layer of absorbable suture and Dermabond. Gel-Foam was placed in the subcutaneous tract. The catheter was secured to the skin using Prolene suture. IMPRESSION: Successful placement of a right jugular tunneled dialysis catheter using ultrasound and fluoroscopic guidance. Electronically Signed   By: Markus Daft M.D.   On: 06/16/2021 20:12   IR US Guide Vasc Access Right  Result Date: 06/16/2021 INDICATION: 31 year old with acute on chronic renal failure. Patient needs urgent dialysis. EXAM: FLUOROSCOPIC AND ULTRASOUND GUIDED PLACEMENT OF A TUNNELED DIALYSIS CATHETER Physician: Stephan Minister. Anselm Pancoast, MD MEDICATIONS: Ancef 2 g; The antibiotic was administered  within an appropriate time interval prior to skin puncture. ANESTHESIA/SEDATION: Moderate (conscious) sedation was employed during this procedure. A total of Versed 0.5mg  and fentanyl 12.5 mcg was administered intravenously at the order of the provider performing the procedure. Total intra-service moderate sedation time: 27 minutes. Patient's level of consciousness and vital signs were monitored continuously by radiology nurse throughout the procedure under the supervision of the provider performing the procedure. FLUOROSCOPY TIME:  Fluoroscopy Time: 2 minutes, 6 mGy COMPLICATIONS: None immediate. PROCEDURE: Informed consent was obtained for placement of a tunneled dialysis catheter. The patient was placed supine on the interventional table. Ultrasound confirmed a patent right internal jugular vein. Ultrasound image obtained for documentation. The right neck and chest was prepped and draped in a sterile fashion. Maximal barrier sterile technique was utilized including caps, mask, sterile gowns, sterile gloves, sterile drape, hand hygiene and skin antiseptic. The right neck was anesthetized with 1% lidocaine. A small incision was made with #11 blade scalpel. A 21 gauge needle directed into the right internal jugular vein with ultrasound guidance. A micropuncture dilator set was placed. A 19 cm tip to cuff Palindrome catheter  was selected. The skin below the right clavicle was anesthetized and a small incision was made with an #11 blade scalpel. A subcutaneous tunnel was formed to the vein dermatotomy site. The catheter was brought through the tunnel. The vein dermatotomy site was dilated to accommodate a peel-away sheath. The catheter was placed through the peel-away sheath and directed into the central venous structures. The tip of the catheter was placed at superior cavoatrial junction with fluoroscopy. Fluoroscopic images were obtained for documentation. Both lumens were found to aspirate and flush well. The  proper amount of heparin was flushed in both lumens. The vein dermatotomy site was closed using a single layer of absorbable suture and Dermabond. Gel-Foam was placed in the subcutaneous tract. The catheter was secured to the skin using Prolene suture. IMPRESSION: Successful placement of a right jugular tunneled dialysis catheter using ultrasound and fluoroscopic guidance. Electronically Signed   By: Markus Daft M.D.   On: 06/16/2021 20:12   DG Chest Portable 1 View  Result Date: 06/16/2021 CLINICAL DATA:  Dyspnea.  Shortness of breath. EXAM: PORTABLE CHEST 1 VIEW COMPARISON:  04/30/2021 FINDINGS: A moderate right pleural effusion is identified. Bilateral, multifocal airspace densities are noted within the left upper, left lower and right upper lobe. Atelectasis versus airspace disease noted within the right base. Visualized osseous structures are unremarkable. IMPRESSION: 1. Bilateral airspace opacities compatible with multifocal pneumonia. Followup PA and lateral chest X-ray is recommended in 3-4 weeks following trial of antibiotic therapy to ensure resolution and exclude underlying malignancy. 2. Moderate right pleural effusion. Electronically Signed   By: Kerby Moors M.D.   On: 06/16/2021 13:09   Medications:  furosemide     insulin      Chlorhexidine Gluconate Cloth  6 each Topical Q0600   diphenhydrAMINE  25 mg Intravenous Once   heparin  5,000 Units Subcutaneous Q8H   labetalol  100 mg Oral BID   pantoprazole  40 mg Oral Daily   rosuvastatin  20 mg Oral QHS    Assessment/Plan **acute hypoxic respiratory failure:  sats in 70s on RA, CXR/exam c/w pulmonary edema.   Plan ongoing diuretic + UF with dialysis.  2g sodium, fluid restricted diet.    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  Presented with uremic symptoms and volume overload, initiated HD 1/17, #2 today and plan #3 tomorrow.  Will need  CLIP, perm access - vein mapping ordered, will plan to consult [facility]VS.     **Hyperkalemia:  resolved with HD and lokelma.   **Anemia:  Hb 7.9, no report of active bleeding. Iron sat 22%  - give IV iron and start aranesp 100 qweek 1/18.  Required transfusion in 04/2021, repeat if < 7 here.    **Secondary hyperparathyroidism: PTH in 04/2021 was 167, repeat here.  Corr ca ok, check phos.  Was high in 04/2021.  Renal diet/diabetic diet when eating.    **DM type 1 complicated by DKA: onset age 51, poorly controlled over long term though recently improved in setting of worsening renal function.  Insulin gtt per primary.  Please avoid giving IVF as part of DKA management given volume overload and ESRD   Dispo -- needs to stay for ultrafiltration to improve pulmonary status and to set up outpt HD.      Jannifer Hick MD 06/17/2021, 8:26 AM  [facility] Pager: [phone]

## 2021-06-17 NOTE — Progress Notes (Signed)
After arrival to the unit, patient complained of 9/10 right neck pain at site of new HD catheter and requested for this RN to contact MD to obtain order for Fentanyl. This RN made patient aware of available PRN medication Percocet 5mg  on MAR. Patient was not due for another dose for 2 more hours. RN notified on call MD, Posey Pronto of patient's request. MD advised RN to give PRN percocet 2 hours early. RN implemented order.  Patient had two stools tonight and complained of nausea. Pt requested imodium and Zofran. RN notified on call MD Posey Pronto. MD ordered for an EKG prior to administering Zofran. Also advised to continue to monitor stool for the shift. Due to history of and QT prolongation on EKG, Zofran was not ordered.Order placed for Benadryl inj 25mg . RN to implement and found patient asleep. RN did not administer to patient.  MD, Posey Pronto, contacted this RN regarding patient's 2052 CBG of 270. Advised RN to administer 3 units of Novolog to patient. Patient refused to take insulin and expressed that she will wait for breakfast to take her insulin. This RN made MD Posey Pronto aware. MD spoke with patient on the phone, CBG rechecked 389. MD attempted to educate patient about the importance of receiving insulin but patient refused. Patient states that she knows her body and is sensitive to insulin. She will wait for her breakfast, then have her CBG checked and will take insulin then. Patient requested to speak to administration about some concerns. RN made charge nurse, Otila Kluver, aware. RN will also make oncoming RN aware.  Ermalinda Memos, RN

## 2021-06-17 NOTE — Progress Notes (Signed)
Initial Nutrition Assessment  DOCUMENTATION CODES:   Not applicable  INTERVENTION:  -Nepro Shake po BID, each supplement provides 425 kcal and 19 grams protein -renal mvi daily -plan to provide renal diet education at follow-up  NUTRITION DIAGNOSIS:   Increased nutrient needs related to chronic illness (CKD progressed to ESRD requiring HD) as evidenced by estimated needs.  GOAL:   Patient will meet greater than or equal to 90% of their needs  MONITOR:   PO intake, Supplement acceptance, Labs, Weight trends, I & O's  REASON FOR ASSESSMENT:   Consult Assessment of nutrition requirement/status  ASSESSMENT:   Pt with a pertinent PMH of type 1 diabetes c/p nephropathy, anemia of chronic disease, proliferative diabetic retinopathy, who presents to [facility] from nephrology office for acute hypoxic respiratory failure secondary to progression to ESRD requiring dialysis  1/17 - s/p placement of RIJ TDC; 1st HD  Pt unavailable at time of RD visit x 2 attempts.   Per H&P, "pt denies nausea, vomiting, constipation, diarrhea, fever, chills... She has had DKA before and states that her symptoms are currently are not consistent with DKA. Reports she has abdominal pain which was chronic.  Denies any worsening abdominal pain.  She is taking oxycodone for the pain.  States that pain is constant and not triggered with food.  Pain located in the epigastric region.  She said that they have done extensive work-up but could not find a cause."  Nephrology was consulted due to pt having progressive azotemia and volume overload over the past few months.  She's had 6 admissions since 10/2020 with last being early 04/2021 -- treated for anemia and volume overload.     Per MD, pt reports being 65lbs over her UBW. Will use IBW to estimate needs at this time.   PO Intake: none documented   Last HD 1/17 w/ 1L net UF Post-HD/Current weight: 75.3 kg  UOP: 2x unmeasured occurrences x24 hours I/O: -798ml since  admit  Pt with deep pitting edema to BUE and BLE in addition to mild pitting facial edema per RN assessment  Medications: Scheduled Meds:  Chlorhexidine Gluconate Cloth  6 each Topical Q0600   darbepoetin (ARANESP) injection - DIALYSIS  100 mcg Intravenous Q Wed-HD   diphenhydrAMINE  25 mg Intravenous Once   heparin  5,000 Units Subcutaneous Q8H   labetalol  100 mg Oral BID   [START ON 06/18/2021] levothyroxine  25 mcg Oral QAC breakfast   pantoprazole  40 mg Oral Daily   rosuvastatin  20 mg Oral QHS  Continuous Infusions:  ferric gluconate (FERRLECIT) IVPB 125 mg (06/17/21 0952)   furosemide     insulin 6 Units/hr (06/17/21 1223)    Labs: Recent Labs  Lab 06/17/21 0341 06/17/21 0849 06/17/21 1035  NA 135 136 135  K 4.8 4.0 3.6  CL 101 100 101  CO2 17* 22 24  BUN 52* 41* 31*  CREATININE 4.52* 3.67* 2.84*  CALCIUM 7.6* 7.8* 7.6*  GLUCOSE 378* 292* 227*  CBGs: 270-389 x24 hours    NUTRITION - FOCUSED PHYSICAL EXAM: Unable to complete at this time. Will attempt at follow-up.   Diet Order:   Diet Order             Diet renal with fluid restriction Fluid restriction: 1200 mL Fluid; Room service appropriate? Yes; Fluid consistency: Thin  Diet effective now                   EDUCATION NEEDS:   No  education needs have been identified at this time  Skin:  Skin Assessment: Reviewed RN Assessment  Last BM:  1/17  Height:   Ht Readings from Last 1 Encounters:  06/16/21 5\' 5"  (1.651 m)    Weight:   Wt Readings from Last 1 Encounters:  06/17/21 75.3 kg    Ideal Body Weight:  56.8 kg  BMI:  Body mass index is 27.62 kg/m.  Estimated Nutritional Needs:   Kcal:  1900-2100  Protein:  95-105 grams  Fluid:  1L+UOP     Theone Stanley., MS, RD, LDN (she/her/hers) RD pager number and weekend/on-call pager number located in [place].

## 2021-06-17 NOTE — Consult Note (Signed)
[facility]   ASSESSMENT / PLAN: Ann Robertson is a 31 y.o. right handed female in need of permanent hemodialysis access. I reviewed options for dialysis in detail with the patient. I counseled the patient that dialysis access requires surveillance and periodic maintenance. Plan to proceed with left arm arteriovenous graft vs. Fistula on Friday 06/19/21 with Dr. Scot Dock. Vein mapping pending. Already has Dane in place.    CHIEF COMPLAINT: in need of permanent dialysis access  HISTORY OF PRESENT ILLNESS: Ann Robertson is a 31 y.o. female with new diagnosis of ESRD. She is right handed. She has never had dialysis access before. She had a tunneled dialysis catheter placed yesterday by IR. No history of prior catheterization. We discussed dialysis access for the majority of the visit.  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   Neuropathy    Preeclampsia    STD (sexually transmitted disease)     Past Surgical History:  Procedure Laterality Date   CESAREAN SECTION     CESAREAN SECTION N/A 07/05/2013   Procedure: CESAREAN SECTION;  Surgeon: Emily Filbert, MD;  Location: [facility];  Service: Obstetrics;  Laterality: N/A;   CESAREAN SECTION N/A 07/25/2014   Procedure: CESAREAN SECTION;  Surgeon: Donnamae Jude, MD;  Location: [facility];  Service: Obstetrics;  Laterality: N/A;   CESAREAN SECTION N/A 11/21/2015   Procedure: CESAREAN SECTION;  Surgeon: Donnamae Jude, MD;  Location: [facility];  Service: Obstetrics;  Laterality: N/A;   IR FLUORO GUIDE CV LINE RIGHT  06/16/2021   IR US GUIDE VASC ACCESS RIGHT  06/16/2021   WISDOM TOOTH EXTRACTION      Family History  Problem Relation Age of Onset   Stroke Mother    Hypertension Mother    Heart disease Mother    Kidney disease Mother    Hyperlipidemia Father    Diabetes Father     Social History   Socioeconomic History   Marital status:  Single    Spouse name: Not on file   Number of children: Not on file   Years of education: Not on file   Highest education level: Not on file  Occupational History   Not on file  Tobacco Use   Smoking status: Every Day    Packs/day: 0.25    Years: 9.00    Pack years: 2.25    Types: Cigarettes   Smokeless tobacco: Never  Vaping Use   Vaping Use: Not on file  Substance and Sexual Activity   Alcohol use: Yes    Comment: occasional   Drug use: No   Sexual activity: Not Currently    Birth control/protection: None  Other Topics Concern   Not on file  Social History Narrative   Not on file   Social Determinants of Health   Financial Resource Strain: Not on file  Food Insecurity: Not on file  Transportation Needs: Not on file  Physical Activity: Not on file  Stress: Not on file  Social Connections: Not on file  Intimate Partner Violence: Not on file    Allergies  Allergen Reactions   Hydromorphone Hcl Itching and Rash    Current Facility-Administered Medications  Medication Dose Route Frequency Provider Last Rate Last Admin   Chlorhexidine Gluconate Cloth 2 % PADS 6 each  6 each Topical Q0600 Justin Mend, MD       Darbepoetin Alfa (ARANESP) injection 100  mcg  100 mcg Intravenous Q Wed-HD Justin Mend, MD   100 mcg at 06/17/21 0915   dextrose 50 % solution 0-50 mL  0-50 mL Intravenous PRN Virl Axe, MD       diphenhydrAMINE (BENADRYL) injection 25 mg  25 mg Intravenous Once Lajean Manes, MD       ferric gluconate (FERRLECIT) 125 mg in sodium chloride 0.9 % 100 mL IVPB  125 mg Intravenous Daily Justin Mend, MD 110 mL/hr at 06/17/21 0952 125 mg at 06/17/21 2297   furosemide (LASIX) 120 mg in dextrose 5 % 50 mL IVPB  120 mg Intravenous BID Justin Mend, MD       heparin injection 5,000 Units  5,000 Units Subcutaneous Q8H Masters, Katie, DO       insulin regular, human (MYXREDLIN) 100 units/ 100 mL infusion   Intravenous Continuous Virl Axe, MD        labetalol (NORMODYNE) tablet 100 mg  100 mg Oral BID Masters, Katie, DO   100 mg at 06/16/21 2201   oxyCODONE-acetaminophen (PERCOCET/ROXICET) 5-325 MG per tablet 1 tablet  1 tablet Oral Q6H PRN Masters, Joellen Jersey, DO   1 tablet at 06/17/21 0910   pantoprazole (PROTONIX) EC tablet 40 mg  40 mg Oral Daily Masters, Katie, DO   40 mg at 06/16/21 2202   rosuvastatin (CRESTOR) tablet 20 mg  20 mg Oral QHS Masters, Katie, DO   20 mg at 06/16/21 2202    REVIEW OF SYSTEMS:  [X]  denotes positive finding, [ ]  denotes negative finding Cardiac  Comments:  Chest pain or chest pressure:    Shortness of breath upon exertion:    Short of breath when lying flat:    Irregular heart rhythm:        Vascular    Pain in calf, thigh, or hip brought on by ambulation:    Pain in feet at night that wakes you up from your sleep:     Blood clot in your veins:    Leg swelling:         Pulmonary    Oxygen at home:    Productive cough:     Wheezing:         Neurologic    Sudden weakness in arms or legs:     Sudden numbness in arms or legs:     Sudden onset of difficulty speaking or slurred speech:    Temporary loss of vision in one eye:     Problems with dizziness:         Gastrointestinal    Blood in stool:     Vomited blood:         Genitourinary    Burning when urinating:     Blood in urine:        Psychiatric    Major depression:         Hematologic    Bleeding problems:    Problems with blood clotting too easily:        Skin    Rashes or ulcers:        Constitutional    Fever or chills:      PHYSICAL EXAM Vitals:   06/17/21 1000 06/17/21 1017 06/17/21 1028 06/17/21 1124  BP: (!) 149/59 (!) 155/97 (!) 166/97 (!) 154/92  Pulse: (!) 102 (!) 105 (!) 102 98  Resp: 14 14 13 18   Temp:    98.2 F (36.8 C)  TempSrc:    Oral  SpO2:  92%  Weight:      Height:        Constitutional: well appearing. no distress. Appears well nourished.  Neurologic: CN intact. no focal findings. no  sensory loss. Psychiatric:  Mood and affect symmetric and appropriate. Eyes:  No icterus. No conjunctival pallor. Ears, nose, throat:  mucous membranes moist. Midline trachea.  Cardiac: regular rate and rhythm.  Respiratory:  unlabored. Abdominal:  soft, non-tender, non-distended.  Peripheral vascular: 2+ radial pulses. Extremity: 2+ BLE pitting edema. no cyanosis. no pallor.  Skin: no gangrene. no ulceration.  Lymphatic: no Stemmer's sign. no palpable lymphadenopathy.  PERTINENT LABORATORY AND RADIOLOGIC DATA  Most recent CBC CBC Latest Ref Rng & Units 06/17/2021 06/16/2021 05/28/2021  WBC 4.0 - 10.5 K/uL 8.8 8.2 -  Hemoglobin 12.0 - 15.0 g/dL 7.3(L) 7.9(L) 7.6(L)  Hematocrit 36.0 - 46.0 % 22.7(L) 24.4(L) -  Platelets 150 - 400 K/uL 316 306 -     Most recent CMP CMP Latest Ref Rng & Units 06/17/2021 06/17/2021 06/17/2021  Glucose 70 - 99 mg/dL 227(H) 292(H) 378(H)  BUN 6 - 20 mg/dL 31(H) 41(H) 52(H)  Creatinine 0.44 - 1.00 mg/dL 2.84(H) 3.67(H) 4.52(H)  Sodium 135 - 145 mmol/L 135 136 135  Potassium 3.5 - 5.1 mmol/L 3.6 4.0 4.8  Chloride 98 - 111 mmol/L 101 100 101  CO2 22 - 32 mmol/L 24 22 17(L)  Calcium 8.9 - 10.3 mg/dL 7.6(L) 7.8(L) 7.6(L)  Total Protein 6.5 - 8.1 g/dL - - 5.4(L)  Total Bilirubin 0.3 - 1.2 mg/dL - - 0.8  Alkaline Phos 38 - 126 U/L - - 130(H)  AST 15 - 41 U/L - - 19  ALT 0 - 44 U/L - - 18    Renal function Estimated Creatinine Clearance: 28.9 mL/min (A) (by C-G formula based on SCr of 2.84 mg/dL (H)).  HbA1c POC (<> result, manual entry) (%)  Date Value  09/10/2020 >15   Hgb A1c MFr Bld (%)  Date Value  06/16/2021 6.5 (H)   Vein mapping pending.  Ann Robertson. Stanford Breed, MD [facility] Phone Number: [phone] 06/17/2021 12:21 PM  Total time spent on preparing this encounter including chart review, data review, collecting history, examining the patient, coordinating care for this new patient, 60  minutes.  Portions of this report may have been transcribed using voice recognition software.  Every effort has been made to ensure accuracy; however, inadvertent computerized transcription errors may still be present.

## 2021-06-17 NOTE — Progress Notes (Signed)
Contacted by Dr Humberto Leep regarding pt's need for out-pt HD at d/c. Met with pt at bedside. Discussed my role and clinic placement process. Pt prefers a clinic close to her home. Closest clinic to pt's home is [facility]. Pt agreeable to proceed with referral to [facility]. Will make referral today. Pt prefers a MWF schedule. Pt needs a chair time 7:30 or after due to needing to get children off to school. Pt agreeable to go to closest clinic that has this schedule available. Pt aware clinic may not be the closest to her home. Will discuss with [facility] admissions. Pt plans to drive self to HD but has family/friends if she needs assistance with transportation. Pt is requesting shower chair at d/c. This info provided to [facility] staff. Will follow and assist.   Melven Sartorius Renal Navigator [phone]

## 2021-06-18 ENCOUNTER — Encounter (HOSPITAL_COMMUNITY): Payer: Self-pay | Admitting: Internal Medicine

## 2021-06-18 ENCOUNTER — Inpatient Hospital Stay (HOSPITAL_COMMUNITY): Payer: Medicaid Other

## 2021-06-18 DIAGNOSIS — J9601 Acute respiratory failure with hypoxia: Secondary | ICD-10-CM | POA: Diagnosis not present

## 2021-06-18 DIAGNOSIS — Z01810 Encounter for preprocedural cardiovascular examination: Secondary | ICD-10-CM

## 2021-06-18 DIAGNOSIS — N186 End stage renal disease: Secondary | ICD-10-CM

## 2021-06-18 LAB — HEPATITIS B DNA, ULTRAQUANTITATIVE, PCR
HBV DNA SERPL PCR-ACNC: NOT DETECTED IU/mL
HBV DNA SERPL PCR-LOG IU: UNDETERMINED log10 IU/mL

## 2021-06-18 LAB — GLUCOSE, CAPILLARY
Glucose-Capillary: 159 mg/dL — ABNORMAL HIGH (ref 70–99)
Glucose-Capillary: 201 mg/dL — ABNORMAL HIGH (ref 70–99)
Glucose-Capillary: 236 mg/dL — ABNORMAL HIGH (ref 70–99)
Glucose-Capillary: 271 mg/dL — ABNORMAL HIGH (ref 70–99)

## 2021-06-18 LAB — RENAL FUNCTION PANEL
Albumin: 1.7 g/dL — ABNORMAL LOW (ref 3.5–5.0)
Anion gap: 8 (ref 5–15)
BUN: 38 mg/dL — ABNORMAL HIGH (ref 6–20)
CO2: 27 mmol/L (ref 22–32)
Calcium: 7.6 mg/dL — ABNORMAL LOW (ref 8.9–10.3)
Chloride: 101 mmol/L (ref 98–111)
Creatinine, Ser: 3.68 mg/dL — ABNORMAL HIGH (ref 0.44–1.00)
GFR, Estimated: 16 mL/min — ABNORMAL LOW (ref >60)
Glucose, Bld: 143 mg/dL — ABNORMAL HIGH (ref 70–99)
Phosphorus: 5.2 mg/dL — ABNORMAL HIGH (ref 2.5–4.6)
Potassium: 3.4 mmol/L — ABNORMAL LOW (ref 3.5–5.1)
Sodium: 136 mmol/L (ref 135–145)

## 2021-06-18 LAB — CBC
HCT: 21.8 % — ABNORMAL LOW (ref 36.0–46.0)
Hemoglobin: 7.2 g/dL — ABNORMAL LOW (ref 12.0–15.0)
MCH: 29.6 pg (ref 26.0–34.0)
MCHC: 33 g/dL (ref 30.0–36.0)
MCV: 89.7 fL (ref 80.0–100.0)
Platelets: 305 10*3/uL (ref 150–400)
RBC: 2.43 MIL/uL — ABNORMAL LOW (ref 3.87–5.11)
RDW: 14.6 % (ref 11.5–15.5)
WBC: 6.8 10*3/uL (ref 4.0–10.5)
nRBC: 0.3 % — ABNORMAL HIGH (ref 0.0–0.2)

## 2021-06-18 LAB — HEPATITIS B SURFACE ANTIBODY, QUANTITATIVE: Hep B S AB Quant (Post): <3.1 m[IU]/mL — ABNORMAL LOW (ref >9.9)

## 2021-06-18 LAB — HEPATITIS B SURFACE ANTIGEN: Hepatitis B Surface Ag: NONREACTIVE

## 2021-06-18 LAB — HEPATITIS B SURFACE ANTIBODY,QUALITATIVE: Hep B S Ab: NONREACTIVE

## 2021-06-18 LAB — TSH: TSH: 4.19 u[IU]/mL (ref 0.350–4.500)

## 2021-06-18 MED ORDER — INSULIN GLARGINE-YFGN 100 UNIT/ML ~~LOC~~ SOLN
4.0000 [IU] | Freq: Every day | SUBCUTANEOUS | Status: DC
  Administered 2021-06-18 – 2021-06-19 (×2): 4 [IU] via SUBCUTANEOUS
  Filled 2021-06-18 (×3): qty 0.04

## 2021-06-18 MED ORDER — HEPARIN SODIUM (PORCINE) 1000 UNIT/ML DIALYSIS
1000.0000 [IU] | INTRAMUSCULAR | Status: DC | PRN
  Filled 2021-06-18: qty 1

## 2021-06-18 MED ORDER — PENTAFLUOROPROP-TETRAFLUOROETH EX AERO: 1.0000 "application " | INHALATION_SPRAY | CUTANEOUS | Status: DC | PRN

## 2021-06-18 MED ORDER — SODIUM CHLORIDE 0.9 % IV SOLN: 100.0000 mL | INTRAVENOUS | Status: DC | PRN

## 2021-06-18 MED ORDER — TORSEMIDE 20 MG PO TABS
100.0000 mg | ORAL_TABLET | Freq: Two times a day (BID) | ORAL | Status: DC
  Administered 2021-06-18 – 2021-06-20 (×4): 100 mg via ORAL
  Filled 2021-06-18 (×4): qty 5

## 2021-06-18 MED ORDER — LIDOCAINE HCL (PF) 1 % IJ SOLN
5.0000 mL | INTRAMUSCULAR | Status: DC | PRN
  Filled 2021-06-18: qty 5

## 2021-06-18 MED ORDER — ORAL CARE MOUTH RINSE
15.0000 mL | Freq: Two times a day (BID) | OROMUCOSAL | Status: DC
  Administered 2021-06-18 – 2021-06-19 (×2): 15 mL via OROMUCOSAL

## 2021-06-18 MED ORDER — LIDOCAINE-PRILOCAINE 2.5-2.5 % EX CREA: 1.0000 "application " | TOPICAL_CREAM | CUTANEOUS | Status: DC | PRN

## 2021-06-18 MED ORDER — INSULIN ASPART 100 UNIT/ML IJ SOLN
0.0000 [IU] | Freq: Three times a day (TID) | INTRAMUSCULAR | Status: DC
  Administered 2021-06-18: 5 [IU] via SUBCUTANEOUS
  Administered 2021-06-18: 3 [IU] via SUBCUTANEOUS
  Administered 2021-06-19: 7 [IU] via SUBCUTANEOUS
  Administered 2021-06-20: 9 [IU] via SUBCUTANEOUS
  Administered 2021-06-20: 3 [IU] via SUBCUTANEOUS

## 2021-06-18 MED ORDER — ALTEPLASE 2 MG IJ SOLR: 2.0000 mg | Freq: Once | INTRAMUSCULAR | Status: DC | PRN

## 2021-06-18 MED ORDER — INSULIN ASPART 100 UNIT/ML IJ SOLN
2.0000 [IU] | Freq: Three times a day (TID) | INTRAMUSCULAR | Status: DC
  Administered 2021-06-18 – 2021-06-20 (×4): 2 [IU] via SUBCUTANEOUS

## 2021-06-18 NOTE — Discharge Summary (Addendum)
Name: Ann Robertson MRN: [id number] DOB: 01/19/1991 31 y.o. PCP: [facility]  Date of Admission: 06/16/2021 12:43 PM Date of Discharge: 06/19/21 Attending Physician: Charise Killian, MD  Discharge Diagnosis: 1.  Acute hypoxic respiratory failure 2.  End-stage renal disease, initiated HD 3.  Hyperkalemia 4.  Anion gap metabolic acidosis 5.  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   Dexcom G6 Sensor Misc Apply topically.   Dexcom G6 Sensor Misc   Dexcom G6 Transmitter Misc   feeding supplement (NEPRO CARB STEADY) Liqd Take 237 mLs by mouth 2 (two) times daily between meals. Start taking on: June 20, 2021   insulin lispro 100 UNIT/ML KwikPen Commonly known as: HUMALOG Inject 1-6 Units into the skin See admin instructions. Inject 1-6 units up to three times daily with meals (only if eating) per sliding scale based on CBG and Carb count   labetalol 100 MG tablet Commonly known as: NORMODYNE Take 100 mg by mouth 2 (two) times daily.   Lantus SoloStar 100 UNIT/ML Solostar Pen Generic drug: insulin glargine Inject 2-5 Units into the skin daily as needed (high BGL). Inject 2-5 units as needed for High glucose levels What changed:  how much to take when to take this   levothyroxine 25 MCG tablet Commonly known as: SYNTHROID Take 25 mcg by mouth daily before breakfast.   methocarbamol 750 MG  tablet Commonly known as: ROBAXIN Take 750 mg by mouth every 8 (eight) hours as needed for muscle spasms.   multivitamin Tabs tablet Take 1 tablet by mouth at bedtime.   omeprazole 20 MG capsule Commonly known as: PRILOSEC Take 20 mg by mouth daily as needed (acid reflux/heartburn).   ondansetron 4 MG tablet Commonly known as: ZOFRAN Take 1 tablet (4 mg total) by mouth every 8 (eight) hours as needed for nausea or vomiting.   oxyCODONE-acetaminophen 10-325 MG tablet Commonly known as: PERCOCET Take 1 tablet by mouth every 4 (four) hours as needed for pain.   Pen Needles 30G X 8 MM Misc 1 each by Does not apply route daily. E11.9   rosuvastatin 20 MG tablet Commonly known as: CRESTOR Take 20 mg by mouth at bedtime.   torsemide 100 MG tablet Commonly known as: DEMADEX 100 mg twice daily on non-dialysis days. What changed:  medication strength how much to take how to take this when to take this additional instructions   Vitamin D (Ergocalciferol) 1.25 MG (50000 UNIT) Caps capsule Commonly known as: DRISDOL Take 50,000 Units by mouth every Tuesday.               Durable Medical Equipment  (From admission, onward)           Start     Ordered   06/19/21 1541  DME Oxygen  Once       Question Answer Comment  Length of Need 6 Months   Mode  or (Route) Nasal cannula   Liters per Minute 2   Oxygen delivery system Gas      06/19/21 1542   06/18/21 1343  For home use only DME Tub bench  Once       Comments: Shower chair   06/18/21 1343   06/17/21 1456  For home use only DME 3 n 1  Once        06/17/21 1455            Disposition and follow-up:   Ms.Ann Robertson was discharged from [facility] in Stable condition.  At the hospital follow up visit please address:  ESRD, initiated HD now on MWF schedule Acute hypoxic respiratory failure Right IJ TDC placed 06/16/21. Left AVG placed 06/19/21. Patient will on on MWF HD schedule at [facility]. Patient was discharged with supplemental oxygen. She was requiring 2L with ambulation at discharge.  It is likely as more fluid is taken off with HD that she may require less supplemental oxygen. Please reevaluate.  Type 1 Diabetes Patient follows with Dr Ilona Sorrel for endocrinology.  Next appt 07/10/2021. Ambulatory referral placed for registered dietician.  Medication changes: Start- Torsemide 100 mg BID on non-HD days  Stop- patient reported discontinuing enalapril 5 mg and has been taking amlodipine at home.  Labs / imaging needed at time of follow-up: CBC, Renal Function  Pending labs/ test needing follow-up: none  Follow-up Appointments:  Follow-up Information     [facility] Follow up.   Why: Schedule is Monday,Wednesday, Friday.  Pt can start on Monday.  Patient needs to arrive at 9:20 for first appointment to complete paperwork.  After first appointment, pt needs to arrive at 9:45 for 10:05 chair time. Contact information: [place] [address] [phone]         [facility] Follow up.   Why: As needed Contact information: [address] [place] [phone]               PCP: [facility] Course by problem list: Acute hypoxic respiratory failure End-stage renal disease, initiated HD Hyperkalemia Patient presented to ED from nephrology clinic due to volume overload and saturating in the 70s on room air.  She was placed on 4L [place]. Labs significant for Hgb 7.9, K+ 5.7, and BUN 61. CXR showed pulmonary edema and pleural effusions bilaterally. She had a right jugular tunneled catheter placed by IR and received HD following with IV Lasix 120 mg BID. Patient underwent HD x3 and 6.6 L were taken off. Left arm AVG placed by IR on 01/20. Patient will follow-up with [facility] dialysis center on MWF. Patient was discharged with supplemental oxygen at 2L  for use with ambulation.  Anion gap metabolic acidosis Diabetic ketoacidosis Initial labs showed bicarb of 20 with anion gap of 16, low suspicion of DKA and thought that anion gap more likely from CKD.  Patient received HD and labs were improved. However, overnight blood glucose increased into the 300s, Beta-hydroxy elevated 4.58 and Anion gap elevated. Patient received another HD session and IV insulin was given until AG closed.    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  She follows with Dr. Janese Banks and has follow-up arranged for 02/23.  Last A1c of 6.92 a few months ago.  She takes 4 units of Lantus with sliding scale humalog  at home.  Anemia of chronic disease History of anemia requiring multiple transfusions (last 12/22).  Outpatient work-up with EGD and colonoscopy negative.  Reticulocytes hypoproliferative, TIBC 174, ferritin elevated at 652.  Nephrology planning to give IV iron and aranesp with HD.  Hyperlipidemia Patient takes crestor 20 mg.  Hypertension On admission, home medications included labetalol and patient reported taking amlodipine. With ESRD on HD enalapril was discontinued.  Subjective: Patient reassessed at bedside this PM following returning from PACU. She states that she feels tired, but would like to go home after dinner. She does not have any complaints or concerns at this time. We discussed medication changes and follow-up with dialysis center.  Discharge Exam:   BP (!) 148/92 (BP Location: Right Arm)    Pulse 98    Temp 97.6 F (36.4 C) (Oral)    Resp 15    Ht 5\' 5"  (1.651 m)    Wt 73.5 kg    SpO2 90%    BMI 26.96 kg/m  Discharge exam:  Constitutional: Chronically ill appearing, in no acute distress HENT: normocephalic atraumatic, mucous membranes moist Eyes: conjunctiva non-erythematous Neck: supple Cardiovascular: tachycardia and sinus rhythm, no m/r/g, palpable thrill left  AVG Pulmonary/Chest: regular work of breathing on RA at rest, lungs clear to auscultation bilaterally Abdominal: soft, non-tender, non-distended MSK: normal bulk and tone, 2+ pitting edema to knees bilaterally Neurological: alert & oriented x 3 Skin: warm and dry Psych: normal mood and affect  Pertinent Labs, Studies, and Procedures:  CBC Latest Ref Rng & Units 06/19/2021 06/18/2021 06/17/2021  WBC 4.0 - 10.5 K/uL 6.0 6.8 8.8  Hemoglobin 12.0 - 15.0 g/dL 7.6(L) 7.2(L) 7.3(L)  Hematocrit 36.0 - 46.0 % 22.8(L) 21.8(L) 22.7(L)  Platelets 150 - 400 K/uL 339 305 316   BMP Latest Ref Rng & Units 06/19/2021 06/18/2021 06/17/2021  Glucose 70 - 99 mg/dL 209(H) 143(H) 167(H)  BUN 6 - 20 mg/dL 39(H) 38(H) 35(H)  Creatinine 0.44 - 1.00 mg/dL 4.15(H) 3.68(H) 3.46(H)  Sodium 135 - 145 mmol/L 138 136 137  Potassium 3.5 - 5.1 mmol/L 3.5 3.4(L) 3.6  Chloride 98 - 111 mmol/L 103 101 104  CO2 22 - 32 mmol/L 26 27 27   Calcium 8.9 - 10.3 mg/dL 7.8(L) 7.6(L) 7.5(L)   Ferritin 652 Iron 38 TIBC 174 Saturation Ratios 22 Reticulocytes hypoproliferative  CXR 01/17: IMPRESSION: 1. Bilateral airspace opacities compatible with multifocal pneumonia. Followup PA and lateral chest X-ray is recommended in 3-4 weeks following trial of antibiotic therapy to ensure resolution and exclude underlying malignancy. 2. Moderate right pleural effusion.  SATURATION QUALIFICATIONS: (This note is used to comply with regulatory documentation for home oxygen) Patient Saturations on Room Air at Rest = 97 % Patient Saturations on Room Air while Ambulating = 87% Patient Saturations on 2 Liters of oxygen while Ambulating = 92% Patient Saturations on 2 Liters of oxygen while at Rest = 100% Please briefly explain why patient needs home oxygen: Patient desaturates to 87 % on Room Air with Ambulation.   Discharge Instructions: Discharge Instructions     Consult to Registered Dietitian   Complete by: As directed    Diet - low  sodium heart healthy   Complete by: As directed    Discharge instructions   Complete by: As directed    Ms. Stites,  You were recently admitted to [facility] for kidney failure.  This made you retain fluid and made breathing difficult. Dialysis pulled off fluid and you will now be following up with  [facility] for dialysis MWF.  You also had surgery to place a long term dialysis access and it will take some time for this to heal so it can be used.  You will be sent home with supplemental oxygen. As more fluid is taken off in dialysis, you may be able to decrease use of oxygen as directed by your primary care provider.  Continue taking your home medications with the following changes: Start taking -Torsemide 100 mg, two times daily on non-dialysis days -Multivitamin -Please consider drinking nephro shakes to give you the right nutrition  Stop taking -I will update your medication list to not include enalapril.  You should seek further medical care if you develop shortness of breath or bleeding from dialysis assess.  Please follow-up with your primary care doctor within one week.  Sincerely, Jakorian Marengo, DO   Increase activity slowly   Complete by: As directed    No wound care   Complete by: As directed    No wound care   Complete by: As directed        Signed: Christiana Fuchs, DO 06/19/2021, 4:06 PM   Pager: [phone]

## 2021-06-18 NOTE — Progress Notes (Signed)
Upper extremity arterial study completed.   Please see CV Proc for preliminary results.   Darlin Coco, RDMS, RVT

## 2021-06-18 NOTE — Progress Notes (Signed)
[facility] Progress Note   Subjective:   seen on dialysis - today is just a sequential tx for max UF.   Tolerated HD fine yesterday.  No new symptoms.   Objective Vitals:   06/18/21 0700 06/18/21 0730 06/18/21 0800 06/18/21 0830  BP: (!) 161/94 (!) 165/94 (!) 151/95 (!) 164/99  Pulse: 97 96 94 92  Resp:    (!) 23  Temp:      TempSrc:      SpO2:      Weight:      Height:       Physical Exam General: comfortable in bed Heart:RRR Lungs: normal WOB  Abdomen:  soft, nontender Extremities: 3+ diffuse edema Dialysis Access:  [facility] c/d/i  Additional Objective Labs: Basic Metabolic Panel: Recent Labs  Lab 06/17/21 1829 06/17/21 2232 06/18/21 0700  NA 134* 137 136  K 3.5 3.6 3.4*  CL 102 104 101  CO2 24 27 27   GLUCOSE 142* 167* 143*  BUN 35* 35* 38*  CREATININE 3.25* 3.46* 3.68*  CALCIUM 7.5* 7.5* 7.6*  PHOS  --   --  5.2*    Liver Function Tests: Recent Labs  Lab 06/16/21 1314 06/17/21 0341 06/18/21 0700  AST 20 19  --   ALT 22 18  --   ALKPHOS 124 130*  --   BILITOT 1.1 0.8  --   PROT 5.7* 5.4*  --   ALBUMIN 2.0* 1.8* 1.7*    No results for input(s): LIPASE, AMYLASE in the last 168 hours. CBC: Recent Labs  Lab 06/16/21 1314 06/17/21 0341 06/18/21 0701  WBC 8.2 8.8 6.8  NEUTROABS 6.4  --   --   HGB 7.9* 7.3* 7.2*  HCT 24.4* 22.7* 21.8*  MCV 92.1 90.1 89.7  PLT 306 316 305    Blood Culture    Component Value Date/Time   SDES  04/19/2021 1024    BLOOD BLOOD RIGHT HAND Performed at [facility], [place] [address], [place], [place] [address]    SDES  04/19/2021 1024    BLOOD BLOOD RIGHT WRIST Performed at [place] [address] [place], [place] [address]    [place]  04/19/2021 1024    BOTTLES DRAWN AEROBIC ONLY Blood Culture adequate volume Performed at [place] [address], [place], [place] [address]    [place]  04/19/2021 1024    BOTTLES DRAWN  AEROBIC ONLY Blood Culture adequate volume Performed at [place] [address], [place], [place] [address]    CULT  04/19/2021 1024    NO GROWTH 5 DAYS Performed at [facility] [address], [place], [place] [address]    CULT  04/19/2021 1024    NO GROWTH 5 DAYS Performed at [facility] [address], [place], [place] [address]    REPTSTATUS 04/24/2021 FINAL 04/19/2021 1024   REPTSTATUS 04/24/2021 FINAL 04/19/2021 1024    Cardiac Enzymes: No results for input(s): CKTOTAL, CKMB, CKMBINDEX, TROPONINI in the last 168 hours. CBG: Recent Labs  Lab 06/17/21 1707 06/17/21 1819 06/17/21 1922 06/17/21 2023 06/18/21 0002  GLUCAP 150* 147* 139* 154* 159*    Iron Studies:  Recent Labs    06/16/21 2004  IRON 38  TIBC 174*  FERRITIN 652*    @lablastinr3 @ Studies/Results: IR Fluoro Guide CV Line Right  Result Date: 06/16/2021 INDICATION: 31 year old with acute on chronic renal failure. Patient needs urgent dialysis. EXAM: FLUOROSCOPIC AND ULTRASOUND GUIDED PLACEMENT OF  A TUNNELED DIALYSIS CATHETER Physician: Stephan Minister. Anselm Pancoast, MD MEDICATIONS: Ancef 2 g; The antibiotic was administered within an appropriate time interval prior to skin puncture. ANESTHESIA/SEDATION: Moderate (conscious) sedation was employed during this procedure. A total of Versed 0.5mg  and fentanyl 12.5 mcg was administered intravenously at the order of the provider performing the procedure. Total intra-service moderate sedation time: 27 minutes. Patient's level of consciousness and vital signs were monitored continuously by radiology nurse throughout the procedure under the supervision of the provider performing the procedure. FLUOROSCOPY TIME:  Fluoroscopy Time: 2 minutes, 6 mGy COMPLICATIONS: None immediate. PROCEDURE: Informed consent was obtained for placement of a tunneled dialysis catheter. The patient was placed supine on the interventional table. Ultrasound confirmed a patent  right internal jugular vein. Ultrasound image obtained for documentation. The right neck and chest was prepped and draped in a sterile fashion. Maximal barrier sterile technique was utilized including caps, mask, sterile gowns, sterile gloves, sterile drape, hand hygiene and skin antiseptic. The right neck was anesthetized with 1% lidocaine. A small incision was made with #11 blade scalpel. A 21 gauge needle directed into the right internal jugular vein with ultrasound guidance. A micropuncture dilator set was placed. A 19 cm tip to cuff Palindrome catheter was selected. The skin below the right clavicle was anesthetized and a small incision was made with an #11 blade scalpel. A subcutaneous tunnel was formed to the vein dermatotomy site. The catheter was brought through the tunnel. The vein dermatotomy site was dilated to accommodate a peel-away sheath. The catheter was placed through the peel-away sheath and directed into the central venous structures. The tip of the catheter was placed at superior cavoatrial junction with fluoroscopy. Fluoroscopic images were obtained for documentation. Both lumens were found to aspirate and flush well. The proper amount of heparin was flushed in both lumens. The vein dermatotomy site was closed using a single layer of absorbable suture and Dermabond. Gel-Foam was placed in the subcutaneous tract. The catheter was secured to the skin using Prolene suture. IMPRESSION: Successful placement of a right jugular tunneled dialysis catheter using ultrasound and fluoroscopic guidance. Electronically Signed   By: Markus Daft M.D.   On: 06/16/2021 20:12   IR US Guide Vasc Access Right  Result Date: 06/16/2021 INDICATION: 31 year old with acute on chronic renal failure. Patient needs urgent dialysis. EXAM: FLUOROSCOPIC AND ULTRASOUND GUIDED PLACEMENT OF A TUNNELED DIALYSIS CATHETER Physician: Stephan Minister. Anselm Pancoast, MD MEDICATIONS: Ancef 2 g; The antibiotic was administered within an appropriate  time interval prior to skin puncture. ANESTHESIA/SEDATION: Moderate (conscious) sedation was employed during this procedure. A total of Versed 0.5mg  and fentanyl 12.5 mcg was administered intravenously at the order of the provider performing the procedure. Total intra-service moderate sedation time: 27 minutes. Patient's level of consciousness and vital signs were monitored continuously by radiology nurse throughout the procedure under the supervision of the provider performing the procedure. FLUOROSCOPY TIME:  Fluoroscopy Time: 2 minutes, 6 mGy COMPLICATIONS: None immediate. PROCEDURE: Informed consent was obtained for placement of a tunneled dialysis catheter. The patient was placed supine on the interventional table. Ultrasound confirmed a patent right internal jugular vein. Ultrasound image obtained for documentation. The right neck and chest was prepped and draped in a sterile fashion. Maximal barrier sterile technique was utilized including caps, mask, sterile gowns, sterile gloves, sterile drape, hand hygiene and skin antiseptic. The right neck was anesthetized with 1% lidocaine. A small incision was made with #11 blade scalpel. A 21 gauge needle directed into the right  internal jugular vein with ultrasound guidance. A micropuncture dilator set was placed. A 19 cm tip to cuff Palindrome catheter was selected. The skin below the right clavicle was anesthetized and a small incision was made with an #11 blade scalpel. A subcutaneous tunnel was formed to the vein dermatotomy site. The catheter was brought through the tunnel. The vein dermatotomy site was dilated to accommodate a peel-away sheath. The catheter was placed through the peel-away sheath and directed into the central venous structures. The tip of the catheter was placed at superior cavoatrial junction with fluoroscopy. Fluoroscopic images were obtained for documentation. Both lumens were found to aspirate and flush well. The proper amount of heparin  was flushed in both lumens. The vein dermatotomy site was closed using a single layer of absorbable suture and Dermabond. Gel-Foam was placed in the subcutaneous tract. The catheter was secured to the skin using Prolene suture. IMPRESSION: Successful placement of a right jugular tunneled dialysis catheter using ultrasound and fluoroscopic guidance. Electronically Signed   By: Markus Daft M.D.   On: 06/16/2021 20:12   DG Chest Portable 1 View  Result Date: 06/16/2021 CLINICAL DATA:  Dyspnea.  Shortness of breath. EXAM: PORTABLE CHEST 1 VIEW COMPARISON:  04/30/2021 FINDINGS: A moderate right pleural effusion is identified. Bilateral, multifocal airspace densities are noted within the left upper, left lower and right upper lobe. Atelectasis versus airspace disease noted within the right base. Visualized osseous structures are unremarkable. IMPRESSION: 1. Bilateral airspace opacities compatible with multifocal pneumonia. Followup PA and lateral chest X-ray is recommended in 3-4 weeks following trial of antibiotic therapy to ensure resolution and exclude underlying malignancy. 2. Moderate right pleural effusion. Electronically Signed   By: Kerby Moors M.D.   On: 06/16/2021 13:09   VAS [place] UPPER EXT VEIN MAPPING (PRE-OP AVF)  Result Date: 06/17/2021 UPPER EXTREMITY VEIN MAPPING Patient Name:  Ann Robertson  Date of Exam:   06/17/2021 Medical Rec #: [id number]        Accession #:    [id number] Date of Birth: [birth year]        Patient Gender: F Patient Age:   31 years Exam Location:  [facility] Procedure:      VAS [place] UPPER EXT VEIN MAPPING (PRE-OP AVF) Referring Phys: Jannifer Hick --------------------------------------------------------------------------------  Indications: Pre-access. Comparison Study: no prior Performing Technologist: Archie Patten RVS  Examination Guidelines: A complete evaluation includes B-mode imaging, spectral Doppler, color Doppler, and power Doppler as needed of all accessible  portions of each vessel. Bilateral testing is considered an integral part of a complete examination. Limited examinations for reoccurring indications may be performed as noted. +-----------------+-------------+----------+--------+  Right Cephalic    Diameter (cm) Depth (cm) Findings  +-----------------+-------------+----------+--------+  Shoulder              0.29         0.48              +-----------------+-------------+----------+--------+  Prox upper arm        0.08         0.33              +-----------------+-------------+----------+--------+  Mid upper arm         0.09         0.52              +-----------------+-------------+----------+--------+  Dist upper arm        0.07         0.55              +-----------------+-------------+----------+--------+  Antecubital fossa     0.16         0.64              +-----------------+-------------+----------+--------+  Prox forearm          0.11         0.74              +-----------------+-------------+----------+--------+  Mid forearm           0.18         0.49              +-----------------+-------------+----------+--------+  Dist forearm          0.05         0.42              +-----------------+-------------+----------+--------+ +-----------------+-------------+----------+--------------+  Right Basilic     Diameter (cm) Depth (cm)    Findings     +-----------------+-------------+----------+--------------+  Prox upper arm        0.24         1.59                    +-----------------+-------------+----------+--------------+  Mid upper arm         0.23         1.69                    +-----------------+-------------+----------+--------------+  Dist upper arm        0.25         1.58                    +-----------------+-------------+----------+--------------+  Antecubital fossa     0.10         1.19      branching     +-----------------+-------------+----------+--------------+  Prox forearm                               not visualized   +-----------------+-------------+----------+--------------+  Mid forearm                                not visualized  +-----------------+-------------+----------+--------------+  Distal forearm                             not visualized  +-----------------+-------------+----------+--------------+  Elbow                                      not visualized  +-----------------+-------------+----------+--------------+ +-----------------+-------------+----------+--------------+  Left Cephalic     Diameter (cm) Depth (cm)    Findings     +-----------------+-------------+----------+--------------+  Shoulder              0.18         0.50                    +-----------------+-------------+----------+--------------+  Prox upper arm        0.13         0.50                    +-----------------+-------------+----------+--------------+  Mid upper arm         0.07         0.34                    +-----------------+-------------+----------+--------------+  Dist upper arm        0.13         0.38                    +-----------------+-------------+----------+--------------+  Antecubital fossa     0.16         0.46                    +-----------------+-------------+----------+--------------+  Prox forearm          0.07         0.73                    +-----------------+-------------+----------+--------------+  Mid forearm                                not visualized  +-----------------+-------------+----------+--------------+  Dist forearm                               not visualized  +-----------------+-------------+----------+--------------+  Wrist                                      not visualized  +-----------------+-------------+----------+--------------+ +-----------------+-------------+----------+--------------+  Left Basilic      Diameter (cm) Depth (cm)    Findings     +-----------------+-------------+----------+--------------+  Prox upper arm        0.18         1.30                     +-----------------+-------------+----------+--------------+  Mid upper arm         0.13         0.71                    +-----------------+-------------+----------+--------------+  Dist upper arm        0.90         0.60                    +-----------------+-------------+----------+--------------+  Antecubital fossa                          not visualized  +-----------------+-------------+----------+--------------+  Prox forearm                               not visualized  +-----------------+-------------+----------+--------------+  Mid forearm                                not visualized  +-----------------+-------------+----------+--------------+  Distal forearm                             not visualized  +-----------------+-------------+----------+--------------+  Elbow                                      not visualized  +-----------------+-------------+----------+--------------+ *See table(s) above for measurements and observations.  Diagnosing physician: Jamelle Haring Electronically signed by Jamelle Haring on 06/17/2021 at 6:09:40 PM.    Final    Medications:  sodium chloride     sodium chloride     ferric gluconate (FERRLECIT) IVPB 125 mg (06/17/21 4239)   furosemide 120 mg (06/17/21 2214)    Chlorhexidine Gluconate Cloth  6 each Topical Q0600   darbepoetin (ARANESP) injection - DIALYSIS  100 mcg Intravenous Q Wed-HD   diphenhydrAMINE  25 mg Intravenous Once   feeding supplement (NEPRO CARB STEADY)  237 mL Oral BID BM   insulin aspart  0-9 Units Subcutaneous TID WC   insulin detemir  4 Units Subcutaneous Daily   labetalol  100 mg Oral BID   levothyroxine  25 mcg Oral QAC breakfast   mouth rinse  15 mL Mouth Rinse BID   multivitamin  1 tablet Oral QHS   pantoprazole  40 mg Oral Daily   rosuvastatin  20 mg Oral QHS    Assessment/Plan **acute hypoxic respiratory failure:  sats in 70s on RA, CXR/exam c/w pulmonary edema.   Plan ongoing diuretic + UF with dialysis.  2g sodium, fluid  restricted diet.  Improved and now on RA.    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  Presented with uremic symptoms and volume overload, initiated HD 1/17, #2 1/18.  Having DUF treatment today.  Plan next HD as it appears she'll be on MWF schedule.  CLIP underway.  Will have perm access placed while here - appreciate VVS.    Renal MVI.   **Hyperkalemia:  resolved with HD and lokelma.   **Anemia:  Hb 7.9, no report of active bleeding. Iron sat 22%  - give IV iron and start aranesp 100 qweek 1/18.  Required transfusion in 04/2021, repeat if < 7 here.    **Secondary hyperparathyroidism: PTH in 04/2021 was 167, repeat here.  Corr ca ok, check phos.  Was high in 04/2021.  Renal diet/diabetic diet when eating.    **DM type 1 complicated by DKA: onset age 55, poorly controlled over long term though recently improved in setting of worsening renal function.  Insulin gtt per primary.  Please avoid giving IVF as part of DKA management given volume overload and ESRD   Dispo -- ok to d/c after AV access placed and outpt HD arranged.    Jannifer Hick MD 06/18/2021, 8:43 AM  [facility] Pager: [phone]

## 2021-06-18 NOTE — H&P (View-Only) (Signed)
[facility] [place] PROGRESS NOTE  ASSESSMENT / PLAN: Ann Robertson is a 31 y.o. female new diagnosis of ESRD in need of permanent dialysis access. She has no suitable vein for autogenous access. Plan left arm arteriovenous graft tomorrow AM with Dr. Scot Dock. All questions answered. Order for consent written. NPO at midnight. Discussed plan with her father as well via telephone.   SUBJECTIVE: No questions. Reviewed the plan for tomorrow. She is understanding.  OBJECTIVE: BP (!) 152/90 (BP Location: Right Arm)    Pulse (!) 103    Temp 98.5 F (36.9 C) (Oral)    Resp 12    Ht 5\' 5"  (1.651 m)    Wt 73.5 kg    SpO2 92%    BMI 26.96 kg/m   Intake/Output Summary (Last 24 hours) at 06/18/2021 1436 Last data filed at 06/18/2021 0400 Gross per 24 hour  Intake 200 ml  Output --  Net 200 ml    No distress RIJ TDC Unlabored breathing Mild sinus tachycardia 1+ radial pulses bilaterally  CBC Latest Ref Rng & Units 06/18/2021 06/17/2021 06/16/2021  WBC 4.0 - 10.5 K/uL 6.8 8.8 8.2  Hemoglobin 12.0 - 15.0 g/dL 7.2(L) 7.3(L) 7.9(L)  Hematocrit 36.0 - 46.0 % 21.8(L) 22.7(L) 24.4(L)  Platelets 150 - 400 K/uL 305 316 306     CMP Latest Ref Rng & Units 06/18/2021 06/17/2021 06/17/2021  Glucose 70 - 99 mg/dL 143(H) 167(H) 142(H)  BUN 6 - 20 mg/dL 38(H) 35(H) 35(H)  Creatinine 0.44 - 1.00 mg/dL 3.68(H) 3.46(H) 3.25(H)  Sodium 135 - 145 mmol/L 136 137 134(L)  Potassium 3.5 - 5.1 mmol/L 3.4(L) 3.6 3.5  Chloride 98 - 111 mmol/L 101 104 102  CO2 22 - 32 mmol/L 27 27 24   Calcium 8.9 - 10.3 mg/dL 7.6(L) 7.5(L) 7.5(L)  Total Protein 6.5 - 8.1 g/dL - - -  Total Bilirubin 0.3 - 1.2 mg/dL - - -  Alkaline Phos 38 - 126 U/L - - -  AST 15 - 41 U/L - - -  ALT 0 - 44 U/L - - -    Estimated Creatinine Clearance: 22 mL/min (A) (by C-G formula based on SCr of 3.68 mg/dL (H)).  Ann Robertson. Stanford Breed, MD [facility] Phone Number: [phone] 06/18/2021  2:36 PM

## 2021-06-18 NOTE — TOC Progression Note (Addendum)
Transition of Care [facility]) - Progression Note    Patient Details  Name: Ann Robertson MRN: [id number] Date of Birth: Oct 18, 1990  Transition of [facility]) CM/SW [place], RN Phone Number: 06/18/2021, 1:45 PM  Clinical Narrative:    Order for shower chair. Patient already has a BSC. Patient agreeable to use [employer]. Spoke to [place] with [employer] and shower chair ordered.  TOC will continue to follow for needs   Expected Discharge Plan: Home/Self Care Barriers to Discharge: Continued Medical Work up  Expected Discharge Plan and Services Expected Discharge Plan: Home/Self Care   Discharge Planning Services: CM Consult Post Acute Care Choice: Durable Medical Equipment Living arrangements for the past 2 months: Single Family Home                 DME Arranged: Shower stool DME Agency: [employer] Date DME Agency Contacted: 06/18/21 Time DME Agency Contacted: [phone] Representative spoke with at DME Agency: [place] ([place]) Interventions    Readmission Risk Interventions No flowsheet data found.

## 2021-06-18 NOTE — Progress Notes (Addendum)
Spoke to pt via phone this am regarding option of TCU (4x's a week) vs 3x's a week at d/c. At this time, pt prefers 3x's a week and has no desire to complete treatments at home in the future. Awaiting clinic approval.  Melven Sartorius Renal Navigator [phone]  Addendum at 3:52 pm: Pt has been approved for [facility] on MWF schedule. Pt can start on Monday. Pt needs to arrive at 8:30 for first appt to complete paperwork. After first appt, pt will need to arrive at 9:00 for 9:20 chair time. Met with pt at bedside to discuss above arrangements. Pt gave navigator permission to speak in front of pt's guest. Pt agreeable to schedule/plan. Schedule letter provided to pt and documented arrangements on AVS as well. Contacted attending and nephrologist via secure chat with update.

## 2021-06-18 NOTE — Plan of Care (Signed)
°  Problem: Education: Goal: Knowledge of General Education information will improve Description: Including pain rating scale, medication(s)/side effects and non-pharmacologic comfort measures Outcome: Progressing   Problem: Education: Goal: Knowledge of General Education information will improve Description: Including pain rating scale, medication(s)/side effects and non-pharmacologic comfort measures Outcome: Progressing   Problem: Activity: Goal: Risk for activity intolerance will decrease Outcome: Progressing   Problem: Elimination: Goal: Will not experience complications related to bowel motility Outcome: Progressing Goal: Will not experience complications related to urinary retention Outcome: Progressing   Problem: Pain Managment: Goal: General experience of comfort will improve Outcome: Progressing   Problem: Safety: Goal: Ability to remain free from injury will improve Outcome: Progressing   Problem: Skin Integrity: Goal: Risk for impaired skin integrity will decrease Outcome: Progressing

## 2021-06-18 NOTE — Progress Notes (Signed)
Inpatient Diabetes Program Recommendations  AACE/ADA: New Consensus Statement on Inpatient Glycemic Control (2015)  Target Ranges:  Prepandial:   less than 140 mg/dL      Peak postprandial:   less than 180 mg/dL (1-2 hours)      Critically ill patients:  140 - 180 mg/dL   Lab Results  Component Value Date   GLUCAP 236 (H) 06/18/2021   HGBA1C 6.5 (H) 06/16/2021    Review of Glycemic Control  Latest Reference Range & Units 06/17/21 20:23 06/18/21 00:02 06/18/21 11:14  Glucose-Capillary 70 - 99 mg/dL 154 (H) 159 (H) 236 (H)  (H): Data is abnormally high Diabetes history: Type 1 DM (requires basal/bolus) Outpatient Diabetes medications: Lantus 4 units QD, Humalog 1-6 units TID Current orders for Inpatient glycemic control: Novolog 0-9 units TID, Semglee 4 units QHS  Inpatient Diabetes Program Recommendations:    Consider adding Novolog 2 units TID (Assuming patient consuming >50% of meals).   Thanks, Bronson Curb, MSN, RNC-OB Diabetes Coordinator [phone] (8a-5p)

## 2021-06-18 NOTE — Progress Notes (Signed)
HD#1 SUBJECTIVE:  Patient Summary: Ann Robertson is a 31 y.o. with a pertinent PMH of type 1 diabetes c/p nephropathy, anemia of chronic disease, proliferative diabetic retinopathy, who presented with hypoxia and volume overload and admitted for Acute hypoxic respiratory failure 2/2 to volume overload from ESRD.   Overnight Events: none  Interim History: Patient seen in the dialysis unit. She states that she feels ok this morning.  She hopes to go home tomorrow after AVG placement.  No other concerns at this time.  OBJECTIVE:  Vital Signs: Vitals:   06/17/21 1612 06/17/21 1925 06/18/21 0003 06/18/21 0347  BP: (!) 137/91 (!) 151/86 (!) 147/94 140/88  Pulse: (!) 105 100 (!) 102 96  Resp: 19 16 19 18   Temp: 98.6 F (37 C) 98.4 F (36.9 C) 98.6 F (37 C) 98.6 F (37 C)  TempSrc: Oral Oral Oral Oral  SpO2: 94% 94% 97% 98%  Weight:      Height:       Supplemental O2: Nasal Cannula SpO2: 98 % O2 Flow Rate (L/min): 3 L/min  Filed Weights   06/16/21 1248 06/17/21 0500 06/17/21 0732  Weight: 76.7 kg 75.9 kg 75.3 kg     Intake/Output Summary (Last 24 hours) at 06/18/2021 2409 Last data filed at 06/18/2021 0400 Gross per 24 hour  Intake 200 ml  Output 2000 ml  Net -1800 ml    Net IO Since Admission: -2,520 mL [06/18/21 0616]  Physical Exam: Constitutional: Chronically ill appearing seen in dialysis unit, in no acute distress HENT: normocephalic atraumatic, mucous membranes moist Eyes: conjunctiva non-erythematous Neck: supple, right tunneled catheter in place Cardiovascular: tachycardia and sinus rhythm, no m/r/g Pulmonary/Chest: regular work of breathing on 2 L [place], lungs clear to auscultation bilaterally Abdominal: soft, non-tender, non-distended MSK: normal bulk and tone, 2+ pitting edema to knees bilaterally Neurological: alert & oriented x 3 Skin: warm and dry Psych: normal mood and affect  Patient Lines/Drains/Airways Status     Active Line/Drains/Airways      Name Placement date Placement time Site Days   Peripheral IV 06/16/21 Right;Posterior Hand 06/16/21  1314  Hand  1   Hemodialysis Catheter Right Subclavian Double lumen Permanent (Tunneled) 06/16/21  1611  Subclavian  1            Pertinent Labs: CBC Latest Ref Rng & Units 06/17/2021 06/16/2021 05/28/2021  WBC 4.0 - 10.5 K/uL 8.8 8.2 -  Hemoglobin 12.0 - 15.0 g/dL 7.3(L) 7.9(L) 7.6(L)  Hematocrit 36.0 - 46.0 % 22.7(L) 24.4(L) -  Platelets 150 - 400 K/uL 316 306 -    CMP Latest Ref Rng & Units 06/17/2021 06/17/2021 06/17/2021  Glucose 70 - 99 mg/dL 167(H) 142(H) 241(H)  BUN 6 - 20 mg/dL 35(H) 35(H) 35(H)  Creatinine 0.44 - 1.00 mg/dL 3.46(H) 3.25(H) 3.35(H)  Sodium 135 - 145 mmol/L 137 134(L) 137  Potassium 3.5 - 5.1 mmol/L 3.6 3.5 3.6  Chloride 98 - 111 mmol/L 104 102 101  CO2 22 - 32 mmol/L 27 24 23   Calcium 8.9 - 10.3 mg/dL 7.5(L) 7.5(L) 7.7(L)  Total Protein 6.5 - 8.1 g/dL - - -  Total Bilirubin 0.3 - 1.2 mg/dL - - -  Alkaline Phos 38 - 126 U/L - - -  AST 15 - 41 U/L - - -  ALT 0 - 44 U/L - - -    Recent Labs    06/17/21 1922 06/17/21 2023 06/18/21 0002  GLUCAP 139* 154* 159*      Pertinent Imaging: VAS  [place] UPPER EXT VEIN MAPPING (PRE-OP AVF)  Result Date: 06/17/2021 UPPER EXTREMITY VEIN MAPPING Patient Name:  KATARYNA MCQUILKIN  Date of Exam:   06/17/2021 Medical Rec #: [id number]        Accession #:    [id number] Date of Birth: 1990/11/08        Patient Gender: F Patient Age:   84 years Exam Location:  [facility] Procedure:      VAS [place] UPPER EXT VEIN MAPPING (PRE-OP AVF) Referring Phys: Jannifer Hick --------------------------------------------------------------------------------  Indications: Pre-access. Comparison Study: no prior Performing Technologist: Archie Patten RVS  Examination Guidelines: A complete evaluation includes B-mode imaging, spectral Doppler, color Doppler, and power Doppler as needed of all accessible portions of each vessel. Bilateral  testing is considered an integral part of a complete examination. Limited examinations for reoccurring indications may be performed as noted. +-----------------+-------------+----------+--------+  Right Cephalic    Diameter (cm) Depth (cm) Findings  +-----------------+-------------+----------+--------+  Shoulder              0.29         0.48              +-----------------+-------------+----------+--------+  Prox upper arm        0.08         0.33              +-----------------+-------------+----------+--------+  Mid upper arm         0.09         0.52              +-----------------+-------------+----------+--------+  Dist upper arm        0.07         0.55              +-----------------+-------------+----------+--------+  Antecubital fossa     0.16         0.64              +-----------------+-------------+----------+--------+  Prox forearm          0.11         0.74              +-----------------+-------------+----------+--------+  Mid forearm           0.18         0.49              +-----------------+-------------+----------+--------+  Dist forearm          0.05         0.42              +-----------------+-------------+----------+--------+ +-----------------+-------------+----------+--------------+  Right Basilic     Diameter (cm) Depth (cm)    Findings     +-----------------+-------------+----------+--------------+  Prox upper arm        0.24         1.59                    +-----------------+-------------+----------+--------------+  Mid upper arm         0.23         1.69                    +-----------------+-------------+----------+--------------+  Dist upper arm        0.25         1.58                    +-----------------+-------------+----------+--------------+  Antecubital fossa     0.10  1.19      branching     +-----------------+-------------+----------+--------------+  Prox forearm                               not visualized  +-----------------+-------------+----------+--------------+   Mid forearm                                not visualized  +-----------------+-------------+----------+--------------+  Distal forearm                             not visualized  +-----------------+-------------+----------+--------------+  Elbow                                      not visualized  +-----------------+-------------+----------+--------------+ +-----------------+-------------+----------+--------------+  Left Cephalic     Diameter (cm) Depth (cm)    Findings     +-----------------+-------------+----------+--------------+  Shoulder              0.18         0.50                    +-----------------+-------------+----------+--------------+  Prox upper arm        0.13         0.50                    +-----------------+-------------+----------+--------------+  Mid upper arm         0.07         0.34                    +-----------------+-------------+----------+--------------+  Dist upper arm        0.13         0.38                    +-----------------+-------------+----------+--------------+  Antecubital fossa     0.16         0.46                    +-----------------+-------------+----------+--------------+  Prox forearm          0.07         0.73                    +-----------------+-------------+----------+--------------+  Mid forearm                                not visualized  +-----------------+-------------+----------+--------------+  Dist forearm                               not visualized  +-----------------+-------------+----------+--------------+  Wrist                                      not visualized  +-----------------+-------------+----------+--------------+ +-----------------+-------------+----------+--------------+  Left Basilic      Diameter (cm) Depth (cm)    Findings     +-----------------+-------------+----------+--------------+  Prox upper arm        0.18         1.30                    +-----------------+-------------+----------+--------------+  Mid upper arm         0.13          0.71                    +-----------------+-------------+----------+--------------+  Dist upper arm        0.90         0.60                    +-----------------+-------------+----------+--------------+  Antecubital fossa                          not visualized  +-----------------+-------------+----------+--------------+  Prox forearm                               not visualized  +-----------------+-------------+----------+--------------+  Mid forearm                                not visualized  +-----------------+-------------+----------+--------------+  Distal forearm                             not visualized  +-----------------+-------------+----------+--------------+  Elbow                                      not visualized  +-----------------+-------------+----------+--------------+ *See table(s) above for measurements and observations.  Diagnosing physician: Jamelle Haring Electronically signed by Jamelle Haring on 06/17/2021 at 6:09:40 PM.    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 with a pertinent PMH of type 1 diabetes c/p nephropathy, anemia of chronic disease, proliferative diabetic retinopathy, who presented with hypoxia and volume overload and admitted for Acute hypoxic respiratory failure 2/2 to volume overload from ESRD.   Plan: #Acute hypoxic respiratory failure #ESRD on HD #Hyperkalemia- resolved Vascular surgery planning for Left AVF access placement 01/20.  Patient will follow-up in outpatient HD on MWF. Weight is down 3 kg following 2 sessions of HD and patient receiving third today. -Appreciate nephrology recommendations -IV Lasix 120 mg -Vascular surgery following -Strict I/O -Daily  weight  -Renal function panel in AM - nephro carb steady supplement and multivitamin    #Type 1 diabetes mellitus c/p neuropathy #Anion gap metabolic acidosis- resolved Anion gap closed yesterday evening. Patient started on home long acting dose of Semglee 4 units and sensitive SSI.Prior history of DKA. -Semglee 4 units -Sensitive SSI   #Anemia of chronic disease Hemoglobin trending down to 7.2 today. TIBC low with hypoproliferative retuclicytes.  Nephrology plan to give IV iron and aranesp once weekly -Iron and EPO per nephrology - CBC in AM -Transfuse goal > 7   #HLD -Resume Crestor   #HTN Home medications includes labetalol and enalapril. HR elevated in the 100s. -Resume labetalol 100 mg BID -Hold enalapril  Best Practice: Diet: Renal diet IVF: Fluids: none in setting of volume overload from ESRD VTE: Place and maintain sequential compression device Start: 06/17/21 1518, patient refused heparin Code:  Full AB: none DISPO: Anticipated discharge  3-4  to Home pending permanent dialysis access and outpatient HD established.  Signature: Daleen Bo. Karelly Dewalt, D.O.  Internal Medicine Resident, PGY-1 [facility]  Pager: [phone] 6:16 AM, 06/18/2021   Please contact the on call pager after 5 pm and on weekends at [phone].

## 2021-06-18 NOTE — Progress Notes (Signed)
[facility] [place] PROGRESS NOTE  ASSESSMENT / PLAN: Ann Robertson is a 31 y.o. female new diagnosis of ESRD in need of permanent dialysis access. She has no suitable vein for autogenous access. Plan left arm arteriovenous graft tomorrow AM with Dr. Scot Dock. All questions answered. Order for consent written. NPO at midnight. Discussed plan with her father as well via telephone.   SUBJECTIVE: No questions. Reviewed the plan for tomorrow. She is understanding.  OBJECTIVE: BP (!) 152/90 (BP Location: Right Arm)    Pulse (!) 103    Temp 98.5 F (36.9 C) (Oral)    Resp 12    Ht 5\' 5"  (1.651 m)    Wt 73.5 kg    SpO2 92%    BMI 26.96 kg/m   Intake/Output Summary (Last 24 hours) at 06/18/2021 1436 Last data filed at 06/18/2021 0400 Gross per 24 hour  Intake 200 ml  Output --  Net 200 ml    No distress RIJ TDC Unlabored breathing Mild sinus tachycardia 1+ radial pulses bilaterally  CBC Latest Ref Rng & Units 06/18/2021 06/17/2021 06/16/2021  WBC 4.0 - 10.5 K/uL 6.8 8.8 8.2  Hemoglobin 12.0 - 15.0 g/dL 7.2(L) 7.3(L) 7.9(L)  Hematocrit 36.0 - 46.0 % 21.8(L) 22.7(L) 24.4(L)  Platelets 150 - 400 K/uL 305 316 306     CMP Latest Ref Rng & Units 06/18/2021 06/17/2021 06/17/2021  Glucose 70 - 99 mg/dL 143(H) 167(H) 142(H)  BUN 6 - 20 mg/dL 38(H) 35(H) 35(H)  Creatinine 0.44 - 1.00 mg/dL 3.68(H) 3.46(H) 3.25(H)  Sodium 135 - 145 mmol/L 136 137 134(L)  Potassium 3.5 - 5.1 mmol/L 3.4(L) 3.6 3.5  Chloride 98 - 111 mmol/L 101 104 102  CO2 22 - 32 mmol/L 27 27 24   Calcium 8.9 - 10.3 mg/dL 7.6(L) 7.5(L) 7.5(L)  Total Protein 6.5 - 8.1 g/dL - - -  Total Bilirubin 0.3 - 1.2 mg/dL - - -  Alkaline Phos 38 - 126 U/L - - -  AST 15 - 41 U/L - - -  ALT 0 - 44 U/L - - -    Estimated Creatinine Clearance: 22 mL/min (A) (by C-G formula based on SCr of 3.68 mg/dL (H)).  Ann Aline. Stanford Breed, MD [facility] Phone Number: [phone] 06/18/2021  2:36 PM

## 2021-06-19 ENCOUNTER — Encounter (HOSPITAL_COMMUNITY): Payer: Self-pay | Admitting: Internal Medicine

## 2021-06-19 ENCOUNTER — Encounter (HOSPITAL_COMMUNITY)
Admission: EM | Disposition: A | Discharge status: Home / Self Care | Payer: Self-pay | Source: Ambulatory Visit | Attending: Internal Medicine

## 2021-06-19 ENCOUNTER — Inpatient Hospital Stay (HOSPITAL_COMMUNITY): Payer: Medicaid Other | Admitting: Anesthesiology

## 2021-06-19 DIAGNOSIS — N186 End stage renal disease: Secondary | ICD-10-CM

## 2021-06-19 DIAGNOSIS — E1040 Type 1 diabetes mellitus with diabetic neuropathy, unspecified: Secondary | ICD-10-CM

## 2021-06-19 HISTORY — PX: AV FISTULA PLACEMENT: SHX1204

## 2021-06-19 LAB — RENAL FUNCTION PANEL
Albumin: 1.7 g/dL — ABNORMAL LOW (ref 3.5–5.0)
Anion gap: 9 (ref 5–15)
BUN: 39 mg/dL — ABNORMAL HIGH (ref 6–20)
CO2: 26 mmol/L (ref 22–32)
Calcium: 7.8 mg/dL — ABNORMAL LOW (ref 8.9–10.3)
Chloride: 103 mmol/L (ref 98–111)
Creatinine, Ser: 4.15 mg/dL — ABNORMAL HIGH (ref 0.44–1.00)
GFR, Estimated: 14 mL/min — ABNORMAL LOW (ref >60)
Glucose, Bld: 209 mg/dL — ABNORMAL HIGH (ref 70–99)
Phosphorus: 4.9 mg/dL — ABNORMAL HIGH (ref 2.5–4.6)
Potassium: 3.5 mmol/L (ref 3.5–5.1)
Sodium: 138 mmol/L (ref 135–145)

## 2021-06-19 LAB — CBC
HCT: 22.8 % — ABNORMAL LOW (ref 36.0–46.0)
Hemoglobin: 7.6 g/dL — ABNORMAL LOW (ref 12.0–15.0)
MCH: 29.8 pg (ref 26.0–34.0)
MCHC: 33.3 g/dL (ref 30.0–36.0)
MCV: 89.4 fL (ref 80.0–100.0)
Platelets: 339 10*3/uL (ref 150–400)
RBC: 2.55 MIL/uL — ABNORMAL LOW (ref 3.87–5.11)
RDW: 14.4 % (ref 11.5–15.5)
WBC: 6 10*3/uL (ref 4.0–10.5)
nRBC: 0.5 % — ABNORMAL HIGH (ref 0.0–0.2)

## 2021-06-19 LAB — GLUCOSE, CAPILLARY
Glucose-Capillary: 91 mg/dL (ref 70–99)
Glucose-Capillary: 98 mg/dL (ref 70–99)
Glucose-Capillary: 335 mg/dL — ABNORMAL HIGH (ref 70–99)
Glucose-Capillary: 358 mg/dL — ABNORMAL HIGH (ref 70–99)

## 2021-06-19 LAB — HEPATITIS B DNA, ULTRAQUANTITATIVE, PCR
HBV DNA SERPL PCR-ACNC: NOT DETECTED IU/mL
HBV DNA SERPL PCR-LOG IU: UNDETERMINED log10 IU/mL

## 2021-06-19 LAB — POCT PREGNANCY, URINE: Preg Test, Ur: NEGATIVE

## 2021-06-19 LAB — HEPATITIS B SURFACE ANTIBODY, QUANTITATIVE: Hep B S AB Quant (Post): <3.1 m[IU]/mL — ABNORMAL LOW (ref >9.9)

## 2021-06-19 SURGERY — INSERTION OF ARTERIOVENOUS (AV) GORE-TEX GRAFT ARM
Anesthesia: General | Site: Arm Upper | Laterality: Left

## 2021-06-19 MED ORDER — PHENYLEPHRINE 40 MCG/ML (10ML) SYRINGE FOR IV PUSH (FOR BLOOD PRESSURE SUPPORT)
PREFILLED_SYRINGE | INTRAVENOUS | Status: DC | PRN
Start: 2021-06-19 — End: 2021-06-19
  Administered 2021-06-19: 120 ug via INTRAVENOUS
  Administered 2021-06-19: 80 ug via INTRAVENOUS

## 2021-06-19 MED ORDER — TORSEMIDE 100 MG PO TABS: 100.0000 mg | ORAL_TABLET | Freq: Two times a day (BID) | ORAL | 0 refills | Status: DC

## 2021-06-19 MED ORDER — CEFAZOLIN SODIUM-DEXTROSE 2-3 GM-%(50ML) IV SOLR
INTRAVENOUS | Status: DC | PRN
  Administered 2021-06-19: 2 g via INTRAVENOUS

## 2021-06-19 MED ORDER — LIDOCAINE 2% (20 MG/ML) 5 ML SYRINGE
INTRAMUSCULAR | Status: AC
  Filled 2021-06-19: qty 5

## 2021-06-19 MED ORDER — PROPOFOL 10 MG/ML IV BOLUS
INTRAVENOUS | Status: AC
  Filled 2021-06-19: qty 20

## 2021-06-19 MED ORDER — CEFAZOLIN SODIUM-DEXTROSE 2-4 GM/100ML-% IV SOLN
INTRAVENOUS | Status: AC
  Filled 2021-06-19: qty 100

## 2021-06-19 MED ORDER — NEPRO/CARBSTEADY PO LIQD: 237.0000 mL | Freq: Two times a day (BID) | ORAL | 0 refills | Status: DC

## 2021-06-19 MED ORDER — 0.9 % SODIUM CHLORIDE (POUR BTL) OPTIME
TOPICAL | Status: DC | PRN
  Administered 2021-06-19: 1000 mL

## 2021-06-19 MED ORDER — DEXAMETHASONE SODIUM PHOSPHATE 10 MG/ML IJ SOLN
INTRAMUSCULAR | Status: AC
  Filled 2021-06-19: qty 1

## 2021-06-19 MED ORDER — PROTAMINE SULFATE 10 MG/ML IV SOLN
INTRAVENOUS | Status: AC
  Filled 2021-06-19: qty 5

## 2021-06-19 MED ORDER — ONDANSETRON HCL 4 MG/2ML IJ SOLN
INTRAMUSCULAR | Status: DC | PRN
  Administered 2021-06-19: 4 mg via INTRAVENOUS

## 2021-06-19 MED ORDER — DEXAMETHASONE SODIUM PHOSPHATE 10 MG/ML IJ SOLN
INTRAMUSCULAR | Status: DC | PRN
  Administered 2021-06-19: 4 mg via INTRAVENOUS

## 2021-06-19 MED ORDER — HEPARIN SODIUM (PORCINE) 1000 UNIT/ML IJ SOLN
INTRAMUSCULAR | Status: DC | PRN
  Administered 2021-06-19: 7000 [IU] via INTRAVENOUS

## 2021-06-19 MED ORDER — TORSEMIDE 100 MG PO TABS: ORAL_TABLET | ORAL | 0 refills | Status: DC

## 2021-06-19 MED ORDER — RENA-VITE PO TABS: 1.0000 | ORAL_TABLET | Freq: Every day | ORAL | 0 refills | Status: DC

## 2021-06-19 MED ORDER — HEPARIN SODIUM (PORCINE) 1000 UNIT/ML IJ SOLN
INTRAMUSCULAR | Status: AC
  Filled 2021-06-19: qty 10

## 2021-06-19 MED ORDER — SODIUM CHLORIDE 0.9 % IV SOLN: INTRAVENOUS | Status: DC | PRN

## 2021-06-19 MED ORDER — FENTANYL CITRATE (PF) 250 MCG/5ML IJ SOLN
INTRAMUSCULAR | Status: AC
  Filled 2021-06-19: qty 5

## 2021-06-19 MED ORDER — PROPOFOL 10 MG/ML IV BOLUS
INTRAVENOUS | Status: DC | PRN
  Administered 2021-06-19: 150 mg via INTRAVENOUS

## 2021-06-19 MED ORDER — ONDANSETRON HCL 4 MG/2ML IJ SOLN
INTRAMUSCULAR | Status: AC
  Filled 2021-06-19: qty 2

## 2021-06-19 MED ORDER — ONDANSETRON HCL 4 MG/2ML IJ SOLN: 4.0000 mg | Freq: Once | INTRAMUSCULAR | Status: DC | PRN

## 2021-06-19 MED ORDER — LIDOCAINE 2% (20 MG/ML) 5 ML SYRINGE
INTRAMUSCULAR | Status: DC | PRN
  Administered 2021-06-19: 60 mg via INTRAVENOUS

## 2021-06-19 MED ORDER — LIDOCAINE-EPINEPHRINE (PF) 1 %-1:200000 IJ SOLN
INTRAMUSCULAR | Status: DC | PRN
  Administered 2021-06-19: 28 mL via INTRADERMAL

## 2021-06-19 MED ORDER — FENTANYL CITRATE (PF) 100 MCG/2ML IJ SOLN: 25.0000 ug | INTRAMUSCULAR | Status: DC | PRN

## 2021-06-19 MED ORDER — PHENYLEPHRINE 40 MCG/ML (10ML) SYRINGE FOR IV PUSH (FOR BLOOD PRESSURE SUPPORT)
PREFILLED_SYRINGE | INTRAVENOUS | Status: AC
  Filled 2021-06-19: qty 10

## 2021-06-19 MED ORDER — OXYCODONE-ACETAMINOPHEN 7.5-325 MG PO TABS
1.0000 | ORAL_TABLET | Freq: Once | ORAL | Status: AC
  Administered 2021-06-19: 1 via ORAL
  Filled 2021-06-19: qty 1

## 2021-06-19 MED ORDER — MIDAZOLAM HCL 5 MG/5ML IJ SOLN
INTRAMUSCULAR | Status: DC | PRN
  Administered 2021-06-19: 2 mg via INTRAVENOUS

## 2021-06-19 MED ORDER — MIDAZOLAM HCL 2 MG/2ML IJ SOLN
INTRAMUSCULAR | Status: AC
  Filled 2021-06-19: qty 2

## 2021-06-19 MED ORDER — HEPARIN 6000 UNIT IRRIGATION SOLUTION
Status: DC | PRN
  Administered 2021-06-19: 1

## 2021-06-19 MED ORDER — PROTAMINE SULFATE 10 MG/ML IV SOLN
INTRAVENOUS | Status: DC | PRN
  Administered 2021-06-19 (×2): 10 mg via INTRAVENOUS
  Administered 2021-06-19: 20 mg via INTRAVENOUS

## 2021-06-19 SURGICAL SUPPLY — 36 items
ADH SKN CLS APL DERMABOND .7 (GAUZE/BANDAGES/DRESSINGS) ×2
ARMBAND PINK RESTRICT EXTREMIT (MISCELLANEOUS) ×6 IMPLANT
CANISTER SUCT 3000ML PPV (MISCELLANEOUS) ×4 IMPLANT
CANNULA VESSEL 3MM 2 BLNT TIP (CANNULA) ×4 IMPLANT
CLIP VESOCCLUDE MED 6/CT (CLIP) ×4 IMPLANT
CLIP VESOCCLUDE SM WIDE 6/CT (CLIP) ×4 IMPLANT
COVER PROBE W GEL 5X96 (DRAPES) ×2 IMPLANT
DECANTER SPIKE VIAL GLASS SM (MISCELLANEOUS) ×2 IMPLANT
DERMABOND ADVANCED (GAUZE/BANDAGES/DRESSINGS) ×1
DERMABOND ADVANCED .7 DNX12 (GAUZE/BANDAGES/DRESSINGS) ×3 IMPLANT
ELECT REM PT RETURN 9FT ADLT (ELECTROSURGICAL) ×3
ELECTRODE REM PT RTRN 9FT ADLT (ELECTROSURGICAL) ×3 IMPLANT
GAUZE 4X4 16PLY ~~LOC~~+RFID DBL (SPONGE) ×2 IMPLANT
GLOVE SRG 8 PF TXTR STRL LF DI (GLOVE) ×3 IMPLANT
GLOVE SURG ENC MOIS LTX SZ7.5 (GLOVE) ×4 IMPLANT
GLOVE SURG POLY ORTHO LF SZ7.5 (GLOVE) IMPLANT
GLOVE SURG UNDER LTX SZ8 (GLOVE) ×4 IMPLANT
GLOVE SURG UNDER POLY LF SZ8 (GLOVE) ×3
GOWN STRL REUS W/ TWL LRG LVL3 (GOWN DISPOSABLE) ×9 IMPLANT
GOWN STRL REUS W/TWL LRG LVL3 (GOWN DISPOSABLE) ×9
GRAFT GORETEX STRT 4-7X45 (Vascular Products) ×2 IMPLANT
KIT BASIN OR (CUSTOM PROCEDURE TRAY) ×4 IMPLANT
KIT TURNOVER KIT B (KITS) ×4 IMPLANT
NS IRRIG 1000ML POUR BTL (IV SOLUTION) ×4 IMPLANT
PACK CV ACCESS (CUSTOM PROCEDURE TRAY) ×4 IMPLANT
PAD ARMBOARD 7.5X6 YLW CONV (MISCELLANEOUS) ×8 IMPLANT
SPONGE SURGIFOAM ABS GEL 100 (HEMOSTASIS) IMPLANT
SPONGE T-LAP 18X18 ~~LOC~~+RFID (SPONGE) ×2 IMPLANT
SUT MNCRL AB 4-0 PS2 18 (SUTURE) ×4 IMPLANT
SUT MON AB 4-0 PC3 18 (SUTURE) ×2 IMPLANT
SUT PROLENE 6 0 BV (SUTURE) ×8 IMPLANT
SUT VIC AB 3-0 SH 27 (SUTURE) ×6
SUT VIC AB 3-0 SH 27X BRD (SUTURE) ×4 IMPLANT
TOWEL GREEN STERILE (TOWEL DISPOSABLE) ×4 IMPLANT
UNDERPAD 30X36 HEAVY ABSORB (UNDERPADS AND DIAPERS) ×4 IMPLANT
WATER STERILE IRR 1000ML POUR (IV SOLUTION) ×4 IMPLANT

## 2021-06-19 NOTE — Progress Notes (Signed)
SATURATION QUALIFICATIONS: (This note is used to comply with regulatory documentation for home oxygen)  Patient Saturations on Room Air at Rest = 97 %  Patient Saturations on Room Air while Ambulating = 87%  Patient Saturations on 2 Liters of oxygen while Ambulating = 92%  Patient Saturations on 2 Liters of oxygen while at Rest = 100%  Please briefly explain why patient needs home oxygen:  Patient desaturates to 87 % on Room Air with Ambulation.

## 2021-06-19 NOTE — Op Note (Signed)
° ° °  NAME: Ann Robertson    MRN: [id number] DOB: [birth year]    DATE OF OPERATION: 06/19/2021  PREOP DIAGNOSIS:    Stage V chronic kidney disease  POSTOP DIAGNOSIS:    Same  PROCEDURE:    New left upper arm AV graft (4-7 mm PTFE graft)  SURGEON: Judeth Cornfield. Scot Dock, MD  ASSIST: Karoline Caldwell, PA  ANESTHESIA: General  EBL: Minimal  INDICATIONS:    Ann Robertson is a 31 y.o. female who presents for new access.  She has a functioning tunneled dialysis catheter.  She is right-handed.  Vein map shows that she was not a candidate for a fistula.  FINDINGS:   The brachial vein at the antecubital level was small I therefore elected to place an upper arm graft.  At the completion of the procedure there was a good thrill in the graft and a palpable radial pulse.  TECHNIQUE:   The patient was taken to the operating room and received a general anesthetic.  The left upper extremity was prepped and draped in the usual sterile fashion.  I had looked at the veins myself with the SonoSite and she was not a candidate for a fistula.  The brachial vein at the antecubital level was also small.  I elected to place an upper arm graft.  A longitudinal incision was made just above the antecubital level and the brachial artery was dissected free.  It was somewhat small about 3 mm.  Separate incision was made beneath the axilla and here the high brachial vein was dissected free.  It was about a 4-1/2 mm vein.  A 4-7 mm PTFE graft was then tunneled between the 2 incisions and the patient was then heparinized.  The brachial artery was clamped proximally and distally and a longitudinal arteriotomy was made.  A short segment of the 4 mm and the graft was excised, the graft spatulated and sewn end-to-side to the brachial artery using continuous 6-0 Prolene suture.  The graft was imported the appropriate length for anastomosis to the high brachial vein.  The vein was ligated distally and spatulated proximally.   The graft was cut to the appropriate length spatulated and sewn into into the vein using 2 continuous 6-0 Prolene sutures.  At the completion there was an excellent thrill in the graft.  Heparin was partially reversed with protamine.  There was a palpable radial pulse.  Each of the wounds was closed with 2 deep layers of 3-0 Vicryl and the skin closed with 4-0 Monocryl.  Dermabond was applied.  The patient tolerated the procedure well and was transferred to recovery room in stable condition.  All needle and sponge counts were correct.  Given the complexity of the case a first assistant was necessary in order to expedient the procedure and safely perform the technical aspects of the operation.  Deitra Mayo, MD, FACS [facility]  DATE OF DICTATION:   06/19/2021

## 2021-06-19 NOTE — Transfer of Care (Signed)
Immediate Anesthesia Transfer of Care Note  Patient: Ann Robertson  Procedure(s) Performed: INSERTION OF ARTERIOVENOUS (AV) GORE-TEX GRAFT ARM (Left: Arm Upper)  Patient Location: PACU  Anesthesia Type:General  Level of Consciousness: oriented and patient cooperative  Airway & Oxygen Therapy: Patient Spontanous Breathing and Patient connected to nasal cannula oxygen  Post-op Assessment: Report given to RN and Post -op Vital signs reviewed and stable  Post vital signs: Reviewed  Last Vitals:  Vitals Value Taken Time  BP 141/94 06/19/21 0949  Temp    Pulse 99 06/19/21 0950  Resp 19 06/19/21 0950  SpO2 97 % 06/19/21 0950  Vitals shown include unvalidated device data.  Last Pain:  Vitals:   06/19/21 0030  TempSrc: Oral  PainSc:       Patients Stated Pain Goal: 4 (14/60/47 9987)  Complications: No notable events documented.

## 2021-06-19 NOTE — Discharge Instructions (Signed)
[facility]  Discharge Instructions  AV Fistula or Graft Surgery for Dialysis Access  Please refer to the following instructions for your post-procedure care. Your surgeon or physician assistant will discuss any changes with you.  Activity  You may drive the day following your surgery, if you are comfortable and no longer taking prescription pain medication. Resume full activity as the soreness in your incision resolves.  Bathing/Showering  You may shower after you go home. Keep your incision dry for 48 hours. Do not soak in a bathtub, hot tub, or swim until the incision heals completely. You may not shower if you have a hemodialysis catheter.  Incision Care  Clean your incision with mild soap and water after 48 hours. Pat the area dry with a clean towel. You do not need a bandage unless otherwise instructed. Do not apply any ointments or creams to your incision. You may have skin glue on your incision. Do not peel it off. It will come off on its own in about one week. Your arm may swell a bit after surgery. To reduce swelling use pillows to elevate your arm so it is above your heart. Your doctor will tell you if you need to lightly wrap your arm with an ACE bandage.  Diet  Resume your normal diet. There are not special food restrictions following this procedure. In order to heal from your surgery, it is CRITICAL to get adequate nutrition. Your body requires vitamins, minerals, and protein. Vegetables are the best source of vitamins and minerals. Vegetables also provide the perfect balance of protein. Processed food has little nutritional value, so try to avoid this.  Medications  Resume taking all of your medications. If your incision is causing pain, you may take over-the counter pain relievers such as acetaminophen (Tylenol). If you were prescribed a stronger pain medication, please be aware these medications can cause nausea and constipation. Prevent  nausea by taking the medication with a snack or meal. Avoid constipation by drinking plenty of fluids and eating foods with high amount of fiber, such as fruits, vegetables, and grains.  Do not take Tylenol if you are taking prescription pain medications.  Follow up Your surgeon may want to see you in the office following your access surgery. If so, this will be arranged at the time of your surgery.  Please call us immediately for any of the following conditions:  Increased pain, redness, drainage (pus) from your incision site Fever of 101 degrees or higher Severe or worsening pain at your incision site Hand pain or numbness.  Reduce your risk of vascular disease:  Stop smoking. If you would like help, call [facility] at 1-800-QUIT-NOW 8084543484) or [place] at [place] your cholesterol Maintain a desired weight Control your diabetes Keep your blood pressure down  Dialysis  It will take several weeks to several months for your new dialysis access to be ready for use. Your surgeon will determine when it is okay to use it. Your nephrologist will continue to direct your dialysis. You can continue to use your Permcath until your new access is ready for use.   06/19/2021 Ann Robertson [id number] [birth year]  Surgeon(s): Angelia Mould, MD  Procedure(s): INSERTION OF ARTERIOVENOUS (AV) GORE-TEX GRAFT ARM   May stick graft immediately   May stick graft on designated area only:   X Do not stick left AV graft for 4 weeks    If you have any questions, please call the office  at [phone].

## 2021-06-19 NOTE — Progress Notes (Signed)
Inpatient Diabetes Program Recommendations  AACE/ADA: New Consensus Statement on Inpatient Glycemic Control (2015)  Target Ranges:  Prepandial:   less than 140 mg/dL      Peak postprandial:   less than 180 mg/dL (1-2 hours)      Critically ill patients:  140 - 180 mg/dL   Lab Results  Component Value Date   GLUCAP 201 (H) 06/18/2021   HGBA1C 6.5 (H) 06/16/2021    Review of Glycemic Control  Latest Reference Range & Units 06/18/21 00:02 06/18/21 11:14 06/18/21 16:22 06/18/21 20:17  Glucose-Capillary 70 - 99 mg/dL 159 (H) 236 (H) 271 (H) 201 (H)   Diabetes history: Type 1 DM (requires basal/bolus) Outpatient Diabetes medications: Lantus 4 units QD, Humalog 1-6 units TID Current orders for Inpatient glycemic control: Novolog 0-9 units TID, Semglee 4 units QHS, Novolog 2 units tid meal coverage  Inpatient Diabetes Program Recommendations:    Pt received decadron 4 mg this am.  -  Increase Semglee to 5 units in anticipation of elevated glucose trends -  Increase meal coverage to 3 units tid  Thanks, Tama Headings RN, MSN, BC-ADM Inpatient Diabetes Coordinator Team Pager [phone] (8a-5p)

## 2021-06-19 NOTE — Progress Notes (Addendum)
Met with pt at bedside to provide new schedule letter due to pt's appt time being changed. Pt is approved for [facility] MWF 10:05 chair time. For first appointment, pt needs to arrive at 9:20 to complete paperwork. Thereafter, pt will need to arrive at 9:45. Pt can start on Monday. Correction made to AVS as well. Pt agreeable to plan. Contacted renal NP regarding clinic's need for orders at d/c.   Melven Sartorius Renal Navigator [phone]  Addendum at 4:08 pm: Clinic advised pt for d/c today and will start on Monday.

## 2021-06-19 NOTE — Progress Notes (Signed)
Patient has no wound care orders and not happy with her left arm incisional line being cleansed with normal saline , pt states it does not set well with her and so she wants the charge nurse to call the doctor and get wound care orders . Pt also asked nurse to turn light off in her room and leave room. Nurse speaking to telemetry when asked to leave room

## 2021-06-19 NOTE — Anesthesia Procedure Notes (Signed)
Procedure Name: LMA Insertion Date/Time: 06/19/2021 7:58 AM Performed by: Jenne Campus, CRNA Pre-anesthesia Checklist: Patient identified, Emergency Drugs available, Suction available and Patient being monitored Patient Re-evaluated:Patient Re-evaluated prior to induction Oxygen Delivery Method: Circle System Utilized Preoxygenation: Pre-oxygenation with 100% oxygen Induction Type: IV induction Ventilation: Mask ventilation without difficulty LMA: LMA inserted LMA Size: 4.0 Number of attempts: 1 Placement Confirmation: positive ETCO2 and breath sounds checked- equal and bilateral Tube secured with: Tape Dental Injury: Teeth and Oropharynx as per pre-operative assessment

## 2021-06-19 NOTE — Anesthesia Preprocedure Evaluation (Addendum)
Anesthesia Evaluation  Patient identified by MRN, date of birth, ID band Patient awake    Reviewed: Allergy & Precautions, NPO status , Patient's Chart, lab work & pertinent test results  Airway Mallampati: II  TM Distance: >3 FB Neck ROM: Full    Dental no notable dental hx. (+) Dental Advisory Given, Chipped   Pulmonary neg pulmonary ROS, Current Smoker,    Pulmonary exam normal breath sounds clear to auscultation       Cardiovascular hypertension, Pt. on medications and Pt. on home beta blockers Normal cardiovascular exam Rhythm:Regular Rate:Normal     Neuro/Psych negative neurological ROS  negative psych ROS   GI/Hepatic negative GI ROS, Neg liver ROS,   Endo/Other  diabetes, Type 1, Insulin Dependent  Renal/GU DialysisRenal disease  negative genitourinary   Musculoskeletal negative musculoskeletal ROS (+)   Abdominal   Peds negative pediatric ROS (+)  Hematology  (+) anemia ,   Anesthesia Other Findings   Reproductive/Obstetrics negative OB ROS                            Anesthesia Physical Anesthesia Plan  ASA: 3  Anesthesia Plan: General   Post-op Pain Management:    Induction: Intravenous  PONV Risk Score and Plan: 2 and Ondansetron, Dexamethasone and Treatment may vary due to age or medical condition  Airway Management Planned: LMA  Additional Equipment:   Intra-op Plan:   Post-operative Plan: Extubation in OR  Informed Consent: I have reviewed the patients History and Physical, chart, labs and discussed the procedure including the risks, benefits and alternatives for the proposed anesthesia with the patient or authorized representative who has indicated his/her understanding and acceptance.     Dental advisory given  Plan Discussed with: CRNA and Surgeon  Anesthesia Plan Comments:         Anesthesia Quick Evaluation

## 2021-06-19 NOTE — Progress Notes (Signed)
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 - Progressing   GOAL:   Patient will meet greater than or equal to 90% of their needs - Progressing  MONITOR:   PO intake, Supplement acceptance, Weight trends, Labs  REASON FOR ASSESSMENT:   Consult Assessment of nutrition requirement/status  ASSESSMENT:   Pt with a pertinent PMH of type 1 diabetes c/p nephropathy, anemia of chronic disease, proliferative diabetic retinopathy, who presents to [facility] from nephrology office for acute hypoxic respiratory failure secondary to progression to ESRD requiring dialysis  PT reports that her appetite has been good and that she has been eating well. Pt reports that she typically has eggs, french toast, grits, and sausage for breakfast; unable to provide intake for lunch and dinner due to MD entering to talk with pt.   Pt reports that her UBW use to be 130# and then she dropped down to 100-104# and was around there for a while, then pt gained about 70#.   Pt reports that she uses insulin at home to manage her diabetes. Reports that she would like an insulin pump but has not been able to get one yet. Pt states that she was told that she needs to be seen by a dietitian prior to get one. Will refer pt to outpatient dietitian.   Provided pt with "Nutrition for People on Dialysis" handout. Reviewed that pt has higher protein needs and to focus on eating protein first at meal times. Reviewed that pt will need to limit sodium, potassium, and phosphorus.    Spoke with RN outside of room, RN reports that she ate good  yesterday. Reports that pt has been accepting Nepro but unsure if drinking them, states that they are gone when she returns to the room.   Medications reviewed and include: SSI 0-9 units TID + 2 units TID, Semglee 4 units, Rena-Vit, Protonix, Torsemide, IV antibiotics, IV ferric gluconate Labs reviewed:  - Phosphorus 4.9 - Hgb A1c 6.5% - 24 hr CBG 91-271  HD on 01/19 New UF: 3600 mL  NUTRITION - FOCUSED PHYSICAL EXAM:  Unable to complete due to MD entering.   Diet Order:   Diet Order             Diet renal with fluid restriction Fluid restriction: 1200 mL Fluid; Room service appropriate? Yes; Fluid consistency: Thin  Diet effective now                   EDUCATION NEEDS:   Education needs have been addressed  Skin:  Skin Assessment: Reviewed RN Assessment  Last BM:  01/19  Height:   Ht Readings from Last 1 Encounters:  06/16/21 5\' 5"  (1.651 m)    Weight:   Wt Readings from Last 1 Encounters:  06/18/21 73.5 kg    Ideal Body Weight:  56.8 kg  BMI:  Body mass index is 26.96 kg/m.  Estimated Nutritional Needs:   Kcal:  2000-2200  Protein:  100-115 grams  Fluid:  UOP + 1L    Rykin Route Louie Casa, RD, LDN Clinical Dietitian See [facility] for contact information.

## 2021-06-19 NOTE — Anesthesia Postprocedure Evaluation (Signed)
Anesthesia Post Note  Patient: Shacoya Burkhammer  Procedure(s) Performed: INSERTION OF ARTERIOVENOUS (AV) GORE-TEX GRAFT ARM (Left: Arm Upper)     Patient location during evaluation: PACU Anesthesia Type: General Level of consciousness: awake and alert Pain management: pain level controlled Vital Signs Assessment: post-procedure vital signs reviewed and stable Respiratory status: spontaneous breathing, nonlabored ventilation, respiratory function stable and patient connected to nasal cannula oxygen Cardiovascular status: blood pressure returned to baseline and stable Postop Assessment: no apparent nausea or vomiting Anesthetic complications: no   No notable events documented.  Last Vitals:  Vitals:   06/19/21 1010 06/19/21 1020  BP: (!) 159/100 (!) 153/98  Pulse: 94 93  Resp: (!) 22 (!) 21  Temp:  36.5 C  SpO2: 97% 97%    Last Pain:  Vitals:   06/19/21 1020  TempSrc:   PainSc: 0-No pain                 Violette Morneault S

## 2021-06-19 NOTE — Plan of Care (Signed)

## 2021-06-19 NOTE — Progress Notes (Signed)
[facility] Progress Note   Subjective:   s/p AVG placement today.  Overall feeling ok. Had DUF yesterday 3.6L.  Feeling some better but still with lots of edema.    Objective Vitals:   06/19/21 1005 06/19/21 1010 06/19/21 1020 06/19/21 1203  BP: (!) 159/101 (!) 159/100 (!) 153/98 (!) 148/92  Pulse: 94 94 93 98  Resp: 18 (!) 22 (!) 21 15  Temp:   97.7 F (36.5 C) 97.6 F (36.4 C)  TempSrc:    Oral  SpO2: 96% 97% 97% 90%  Weight:      Height:       Physical Exam General: comfortable in bed Heart:RRR Lungs: normal WOB  Abdomen:  soft, nontender Extremities: 3+ diffuse edema Dialysis Access:  RIJ TDC c/d/I, LUE AVG incisions c/d/I, +b, hand warm  Additional Objective Labs: Basic Metabolic Panel: Recent Labs  Lab 06/17/21 2232 06/18/21 0700 06/19/21 0208  NA 137 136 138  K 3.6 3.4* 3.5  CL 104 101 103  CO2 27 27 26   GLUCOSE 167* 143* 209*  BUN 35* 38* 39*  CREATININE 3.46* 3.68* 4.15*  CALCIUM 7.5* 7.6* 7.8*  PHOS  --  5.2* 4.9*    Liver Function Tests: Recent Labs  Lab 06/16/21 1314 06/17/21 0341 06/18/21 0700 06/19/21 0208  AST 20 19  --   --   ALT 22 18  --   --   ALKPHOS 124 130*  --   --   BILITOT 1.1 0.8  --   --   PROT 5.7* 5.4*  --   --   ALBUMIN 2.0* 1.8* 1.7* 1.7*    No results for input(s): LIPASE, AMYLASE in the last 168 hours. CBC: Recent Labs  Lab 06/16/21 1314 06/17/21 0341 06/18/21 0701 06/19/21 0208  WBC 8.2 8.8 6.8 6.0  NEUTROABS 6.4  --   --   --   HGB 7.9* 7.3* 7.2* 7.6*  HCT 24.4* 22.7* 21.8* 22.8*  MCV 92.1 90.1 89.7 89.4  PLT 306 316 305 339    Blood Culture    Component Value Date/Time   SDES  04/19/2021 1024    BLOOD BLOOD RIGHT HAND Performed at [facility], [place] [address], [place], [place] [address]    SDES  04/19/2021 1024    BLOOD BLOOD RIGHT WRIST Performed at [facility] [address], [place], [place] [address]    [place]  04/19/2021 1024     BOTTLES DRAWN AEROBIC ONLY Blood Culture adequate volume Performed at [facility] [address], [place], [place] [address]    [place]  04/19/2021 1024    BOTTLES DRAWN AEROBIC ONLY Blood Culture adequate volume Performed at [facility] [address] [place], [place] [address]    CULT  04/19/2021 1024    NO GROWTH 5 DAYS Performed at [facility] [address] [place], [place] [address]    CULT  04/19/2021 1024    NO GROWTH 5 DAYS Performed at [facility] [address] [place], [place] [address]    REPTSTATUS 04/24/2021 FINAL 04/19/2021 1024   REPTSTATUS 04/24/2021 FINAL 04/19/2021 1024    Cardiac Enzymes: No results for input(s): CKTOTAL, CKMB, CKMBINDEX, TROPONINI in the last 168 hours. CBG: Recent Labs  Lab 06/18/21 1114 06/18/21 1622 06/18/21 2017 06/19/21 0957 06/19/21 1203  GLUCAP 236* 271* 201* 91 98    Iron Studies:  Recent Labs    06/16/21 2004  IRON 38  TIBC 174*  FERRITIN 652*    @lablastinr3 @ Studies/Results: VAS [place] UPPER EXTREMITY ARTERIAL DUPLEX  Result Date: 06/18/2021  UPPER EXTREMITY DUPLEX STUDY Patient Name:  Ann Robertson  Date of Exam:   06/18/2021 Medical Rec #: [id number]        Accession #:    [id number] Date of Birth: 01/27/1991        Patient Gender: F Patient Age:   31 years Exam Location:  [facility] Procedure:      VAS [place] UPPER EXTREMITY ARTERIAL DUPLEX Referring Phys: Harrell Gave DICKSON --------------------------------------------------------------------------------  Indications: Pre-op AVG, ESRD.  Comparison Study: No prior studies. Performing Technologist: Darlin Coco RDMS, RVT  Examination Guidelines: A complete evaluation includes B-mode imaging, spectral Doppler, color Doppler, and power Doppler as needed of all accessible portions of each vessel. Bilateral testing is considered an integral part of a complete examination. Limited examinations for  reoccurring indications may be performed as noted.  Right Doppler Findings: +-------------+----------+---------+--------+--------+  Site          PSV (cm/s) Waveform  Stenosis Comments  +-------------+----------+---------+--------+--------+  Brachial Dist 71         triphasic          0.38 cm   +-------------+----------+---------+--------+--------+  Radial Dist   63         triphasic          0.19 cm   +-------------+----------+---------+--------+--------+  Ulnar Dist    47         triphasic          0.12 cm   +-------------+----------+---------+--------+--------+   Left Doppler Findings: +-------------+----------+---------+--------+--------+  Site          PSV (cm/s) Waveform  Stenosis Comments  +-------------+----------+---------+--------+--------+  Brachial Dist 80         triphasic          0.39 cm   +-------------+----------+---------+--------+--------+  Radial Dist   65         triphasic          0.18 cm   +-------------+----------+---------+--------+--------+  Ulnar Dist    38         triphasic          0.13 cm   +-------------+----------+---------+--------+--------+   Electronically signed by Orlie Pollen on 06/18/2021 at 6:56:46 PM.    Final    VAS [place] UPPER EXT VEIN MAPPING (PRE-OP AVF)  Result Date: 06/17/2021 [place] MAPPING Patient Name:  Ann Robertson  Date of Exam:   06/17/2021 Medical Rec #: [id number]        Accession #:    [id number] Date of Birth: 21-Aug-1990        Patient Gender: F Patient Age:   31 years Exam Location:  [facility] Procedure:      VAS [place] UPPER EXT VEIN MAPPING (PRE-OP AVF) Referring Phys: Jannifer Hick --------------------------------------------------------------------------------  Indications: Pre-access. Comparison Study: no prior Performing Technologist: Archie Patten RVS  Examination Guidelines: A complete evaluation includes B-mode imaging, spectral Doppler, color Doppler, and power Doppler as needed of all accessible portions of each vessel.  Bilateral testing is considered an integral part of a complete examination. Limited examinations for reoccurring indications may be performed as noted. +-----------------+-------------+----------+--------+  Right Cephalic    Diameter (cm) Depth (cm) Findings  +-----------------+-------------+----------+--------+  Shoulder              0.29         0.48              +-----------------+-------------+----------+--------+  Prox upper arm        0.08         0.33              +-----------------+-------------+----------+--------+  Mid upper arm         0.09         0.52              +-----------------+-------------+----------+--------+  Dist upper arm        0.07         0.55              +-----------------+-------------+----------+--------+  Antecubital fossa     0.16         0.64              +-----------------+-------------+----------+--------+  Prox forearm          0.11         0.74              +-----------------+-------------+----------+--------+  Mid forearm           0.18         0.49              +-----------------+-------------+----------+--------+  Dist forearm          0.05         0.42              +-----------------+-------------+----------+--------+ +-----------------+-------------+----------+--------------+  Right Basilic     Diameter (cm) Depth (cm)    Findings     +-----------------+-------------+----------+--------------+  Prox upper arm        0.24         1.59                    +-----------------+-------------+----------+--------------+  Mid upper arm         0.23         1.69                    +-----------------+-------------+----------+--------------+  Dist upper arm        0.25         1.58                    +-----------------+-------------+----------+--------------+  Antecubital fossa     0.10         1.19      branching     +-----------------+-------------+----------+--------------+  Prox forearm                               not visualized   +-----------------+-------------+----------+--------------+  Mid forearm                                not visualized  +-----------------+-------------+----------+--------------+  Distal forearm                             not visualized  +-----------------+-------------+----------+--------------+  Elbow                                      not visualized  +-----------------+-------------+----------+--------------+ +-----------------+-------------+----------+--------------+  Left Cephalic     Diameter (cm) Depth (cm)    Findings     +-----------------+-------------+----------+--------------+  Shoulder  0.18         0.50                    +-----------------+-------------+----------+--------------+  Prox upper arm        0.13         0.50                    +-----------------+-------------+----------+--------------+  Mid upper arm         0.07         0.34                    +-----------------+-------------+----------+--------------+  Dist upper arm        0.13         0.38                    +-----------------+-------------+----------+--------------+  Antecubital fossa     0.16         0.46                    +-----------------+-------------+----------+--------------+  Prox forearm          0.07         0.73                    +-----------------+-------------+----------+--------------+  Mid forearm                                not visualized  +-----------------+-------------+----------+--------------+  Dist forearm                               not visualized  +-----------------+-------------+----------+--------------+  Wrist                                      not visualized  +-----------------+-------------+----------+--------------+ +-----------------+-------------+----------+--------------+  Left Basilic      Diameter (cm) Depth (cm)    Findings     +-----------------+-------------+----------+--------------+  Prox upper arm        0.18         1.30                     +-----------------+-------------+----------+--------------+  Mid upper arm         0.13         0.71                    +-----------------+-------------+----------+--------------+  Dist upper arm        0.90         0.60                    +-----------------+-------------+----------+--------------+  Antecubital fossa                          not visualized  +-----------------+-------------+----------+--------------+  Prox forearm                               not visualized  +-----------------+-------------+----------+--------------+  Mid forearm  not visualized  +-----------------+-------------+----------+--------------+  Distal forearm                             not visualized  +-----------------+-------------+----------+--------------+  Elbow                                      not visualized  +-----------------+-------------+----------+--------------+ *See table(s) above for measurements and observations.  Diagnosing physician: Jamelle Haring Electronically signed by Jamelle Haring on 06/17/2021 at 6:09:40 PM.    Final    Medications:  sodium chloride     sodium chloride     ceFAZolin     ferric gluconate (FERRLECIT) IVPB 125 mg (06/18/21 1212)    Chlorhexidine Gluconate Cloth  6 each Topical Q0600   darbepoetin (ARANESP) injection - DIALYSIS  100 mcg Intravenous Q Wed-HD   diphenhydrAMINE  25 mg Intravenous Once   feeding supplement (NEPRO CARB STEADY)  237 mL Oral BID BM   insulin aspart  0-9 Units Subcutaneous TID WC   insulin aspart  2 Units Subcutaneous TID WC   insulin glargine-yfgn  4 Units Subcutaneous QHS   labetalol  100 mg Oral BID   levothyroxine  25 mcg Oral QAC breakfast   mouth rinse  15 mL Mouth Rinse BID   multivitamin  1 tablet Oral QHS   pantoprazole  40 mg Oral Daily   rosuvastatin  20 mg Oral QHS   torsemide  100 mg Oral BID    Assessment/Plan **acute hypoxic respiratory failure:  sats in 70s on RA, CXR/exam c/w pulmonary edema.   Plan  ongoing diuretic + UF with dialysis -- it's going to take a while to achieve euvolemia.  Recommend torsemide 100 BID on non HD days in the short term at least given she has decent UOP.  2g sodium, fluid restricted diet.  Improved and now on RA.    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  Presented with uremic symptoms and volume overload, initiated HD 1/17, #2 1/18, DUF 1/19.  [place]IP [place] MWF.  S/p AVG 06/19/20.  Renal MVI.   **Hyperkalemia:  resolved with HD and lokelma.   **Anemia:  Hb 7.9, no report of active bleeding. Iron sat 22%  - give IV iron and start aranesp 100 qweek 1/18.  Required transfusion in 04/2021, repeat if < 7 here.    **Secondary hyperparathyroidism: PTH in 04/2021 was 167.  Corr ca ok, phos 4.9.  Renal diet/diabetic diet.    **DM type 1 complicated by DKA: onset age 74, poorly controlled over long term though recently improved in setting of worsening renal function.  Insulin per primary.     Dispo -- ok to d/c now that AV access placed and outpt HD arranged.    Jannifer Hick MD 06/19/2021, 1:04 PM  [facility] Pager: [phone]

## 2021-06-19 NOTE — Progress Notes (Signed)
PT OFFERED BY NURSE TO CLEAN INCISIONAL LIGHT WITH SCANT DRY SEROSANGUINOOUS DRAINAGE WITH NORMAL SALINE , PT STATES THE DRAINAGE IS STICKING TO HER GOWN. PT REFUSES NORMAL SALINE CLEANSE STATED IT DID NOT SET RIGHT WITH HER. SPOKE TO DR PATEL AND DOES NOT RECOMMEND ANY OTHER WOUND CARE AT THIS TIME AND AWARE PT REFUSES WHAT HAS BEEN OFFERED.

## 2021-06-19 NOTE — Interval H&P Note (Signed)
History and Physical Interval Note:  06/19/2021 7:21 AM  Ann Robertson  has presented today for surgery, with the diagnosis of End Stage Renal Disease.  The various methods of treatment have been discussed with the patient and family. After consideration of risks, benefits and other options for treatment, the patient has consented to  Procedure(s) with comments: LEFT ARTERIOVENOUS (AV) FISTULA VERSUS GRAFT CREATION (Left) - LATERALITY MAY CHANGE as a surgical intervention.  The patient's history has been reviewed, patient examined, no change in status, stable for surgery.  I have reviewed the patient's chart and labs.  Questions were answered to the patient's satisfaction.     Deitra Mayo

## 2021-06-20 LAB — GLUCOSE, CAPILLARY
Glucose-Capillary: 360 mg/dL — ABNORMAL HIGH (ref 70–99)
Glucose-Capillary: 237 mg/dL — ABNORMAL HIGH (ref 70–99)

## 2021-06-20 MED ORDER — OXYCODONE-ACETAMINOPHEN 7.5-325 MG PO TABS
1.0000 | ORAL_TABLET | Freq: Once | ORAL | Status: AC
Start: 2021-06-20 — End: 2021-06-20
  Administered 2021-06-20: 1 via ORAL
  Filled 2021-06-20: qty 1

## 2021-06-20 NOTE — Progress Notes (Signed)
°   06/19/21 2300  Assess: MEWS Score  BP (!) 170/98  Pulse Rate (!) 115  ECG Heart Rate (!) 115  Resp 19  Level of Consciousness Alert  SpO2 94 %  O2 Device Room Air  Assess: MEWS Score  MEWS Temp 0  MEWS Systolic 0  MEWS Pulse 2  MEWS RR 0  MEWS LOC 0  MEWS Score 2  MEWS Score Color Yellow  Assess: if the MEWS score is Yellow or Red  Were vital signs taken at a resting state? Yes  Focused Assessment No change from prior assessment  Early Detection of Sepsis Score *See Row Information* Low  MEWS guidelines implemented *See Row Information* Yes  Treat  MEWS Interventions Other (Comment) (DR PATEL NOTIFIED)  Take Vital Signs  Increase Vital Sign Frequency  Yellow: Q 2hr X 2 then Q 4hr X 2, if remains yellow, continue Q 4hrs  Escalate  MEWS: Escalate Yellow: discuss with charge nurse/RN and consider discussing with provider and RRT  Notify: Charge Nurse/RN  Name of Charge Nurse/RN Notified APRIL RN  Date Charge Nurse/RN Notified 06/19/21  Time Charge Nurse/RN Notified 2300  Notify: Provider  Provider Name/Title PATEL MD  Date Provider Notified 06/19/21  Time Provider Notified 2300  Notification Type Page  Notification Reason Other (Comment)  Provider response Other (Comment)  Date of Provider Response 06/19/21  Time of Provider Response 2310  Document  Patient Outcome Other (Comment)  Progress note created (see row info) Yes

## 2021-06-20 NOTE — Progress Notes (Signed)
S: Patient assessed at bedside this AM.  She was concerned due to pain in left AVG that was placed yesterday.  She has pain medication at home and is more comfortable with going home this morning.  O: Constitutional: well-appearing, sitting in bed with left arm propped up, in no acute distress HENT: normocephalic atraumatic, mucous membranes moist Eyes: conjunctiva non-erythematous Neck: supple Cardiovascular: regular rate and rhythm, no m/r/g, palpable thrill in left AVG Pulmonary/Chest: regular work of breathing on 2L [facility], lungs clear to auscultation bilaterally Abdominal: soft, non-tender, non-distended MSK: normal bulk and tone Neurological: alert & oriented x 3 Skin: warm and dry Psych: normal mood and affect  A/P: Appreciate provider for vascular surgery stepping up to talk with patient this morning.  Patient states that she is comfortable going home today.  We discussed going to HD on Monday at her new outpatient center and asked patient to follow-up with PCP at [facility] within one week. Please refer to discharge summary 06/19/21.

## 2021-06-20 NOTE — Progress Notes (Signed)
°  Progress Note    06/20/2021 10:32 AM 1 Day Post-Op  Subjective:  left arm pain. Pulling pain   Vitals:   06/20/21 0812 06/20/21 0934  BP: (!) 168/101 (!) 162/94  Pulse: (!) 105   Resp:  17  Temp:    SpO2:  96%   Physical Exam: Cardiac:  regular Lungs:  non labored, 2L [place] Incisions:  left upper arm incisions are clean, dry and intact. No swelling, no hematoma Extremities:  left upper arm well perfused and warm. 2+ left radial pulse. Good thrill in graft. Left arm edematous Neurologic: alert and oriented  CBC    Component Value Date/Time   WBC 6.0 06/19/2021 0208   RBC 2.55 (L) 06/19/2021 0208   HGB 7.6 (L) 06/19/2021 0208   HGB 12.7 06/06/2015 0000   HCT 22.8 (L) 06/19/2021 0208   HCT 39 06/06/2015 0000   PLT 339 06/19/2021 0208   PLT 383 06/06/2015 0000   MCV 89.4 06/19/2021 0208   MCH 29.8 06/19/2021 0208   MCHC 33.3 06/19/2021 0208   RDW 14.4 06/19/2021 0208   LYMPHSABS 1.2 06/16/2021 1314   MONOABS 0.4 06/16/2021 1314   EOSABS 0.1 06/16/2021 1314   BASOSABS 0.1 06/16/2021 1314    BMET    Component Value Date/Time   NA 138 06/19/2021 0208   K 3.5 06/19/2021 0208   CL 103 06/19/2021 0208   CO2 26 06/19/2021 0208   GLUCOSE 209 (H) 06/19/2021 0208   BUN 39 (H) 06/19/2021 0208   CREATININE 4.15 (H) 06/19/2021 0208   CREATININE 0.64 11/06/2015 1125   CALCIUM 7.8 (L) 06/19/2021 0208   GFRNONAA 14 (L) 06/19/2021 0208   GFRAA >60 02/18/2019 1752    INR No results found for: INR   Intake/Output Summary (Last 24 hours) at 06/20/2021 1032 Last data filed at 06/19/2021 2241 Gross per 24 hour  Intake 240 ml  Output --  Net 240 ml     Assessment/Plan:  31 y.o. female is s/p Left AV graft placement 1 Day Post-Op   Pain in left upper arm is mostly incisional and also from the tunneling Incisions are intact and well appearing. No ecchymosis.No bleeding. No hematoma Left upper arm is edematous. Encouraged continued elevation. She can also use ACE wrap  for light compression Her left AV graft can be access in 4 weeks She can follow up with vascular as needed   Karoline Caldwell, PA-C [facility] [phone] 06/20/2021 10:32 AM

## 2021-06-20 NOTE — Progress Notes (Signed)
Discharge instructions reviewed with patient using teach back method no questions at this time. Patient being discharged to home.

## 2021-06-20 NOTE — TOC Transition Note (Signed)
Transition of Care [facility]) - CM/SW Discharge Note   Patient Details  Name: Ann Robertson MRN: [id number] Date of Birth: [birth year]  [facility] CM/SW Contact:  Carles Collet, RN Phone Number: 06/20/2021, 9:17 AM   Clinical Narrative:    Oxygen for home ordered. It will be delivered to the room before DC. Patient instructed she must wait for home oxygen before leaving. Updated nurse that it will be delivered to room before she leaves.    Final next level of care: Home/Self Care Barriers to Discharge: No Barriers Identified   Patient Goals and CMS Choice Patient states their goals for this hospitalization and ongoing recovery are:: return home      Discharge Placement                       Discharge Plan and Services   Discharge Planning Services: CM Consult Post Acute Care Choice: Durable Medical Equipment          DME Arranged: Oxygen DME Agency: [employer] Date DME Agency Contacted: 06/20/21 Time DME Agency Contacted: [id number] Representative spoke with at DME Agency: [place]: NA          Social Determinants of Health ([name]) Interventions     Readmission Risk Interventions No flowsheet data found.

## 2021-06-22 ENCOUNTER — Telehealth: Payer: Self-pay | Admitting: Nephrology

## 2021-06-22 ENCOUNTER — Encounter (HOSPITAL_COMMUNITY): Payer: Self-pay | Admitting: Vascular Surgery

## 2021-06-22 NOTE — Telephone Encounter (Signed)
Transition of Care Contact from [place]  Date of Discharge: 06/19/2021 Date of Contact: 06/22/2021 Method of contact: phone - attempted  Attempted to contact patient to discuss transition of care from inpatient admission.  Patient did not answer the phone.  Will attempt to call again and if unable to reach will follow up at dialysis.  Jen Mow, PA-C [facility] Pager: [phone]

## 2021-06-25 ENCOUNTER — Encounter (HOSPITAL_COMMUNITY): Payer: Medicaid Other

## 2021-07-06 ENCOUNTER — Encounter (HOSPITAL_BASED_OUTPATIENT_CLINIC_OR_DEPARTMENT_OTHER): Payer: Self-pay | Admitting: *Deleted

## 2021-07-06 ENCOUNTER — Emergency Department (HOSPITAL_BASED_OUTPATIENT_CLINIC_OR_DEPARTMENT_OTHER)
Admission: EM | Admit: 2021-07-06 | Discharge: 2021-07-06 | Disposition: A | Discharge status: Home / Self Care | Payer: Medicaid Other | Attending: Emergency Medicine | Admitting: Emergency Medicine

## 2021-07-06 ENCOUNTER — Other Ambulatory Visit: Payer: Self-pay

## 2021-07-06 DIAGNOSIS — H6690 Otitis media, unspecified, unspecified ear: Secondary | ICD-10-CM

## 2021-07-06 DIAGNOSIS — N186 End stage renal disease: Secondary | ICD-10-CM | POA: Insufficient documentation

## 2021-07-06 DIAGNOSIS — H6691 Otitis media, unspecified, right ear: Secondary | ICD-10-CM | POA: Diagnosis not present

## 2021-07-06 DIAGNOSIS — H9201 Otalgia, right ear: Secondary | ICD-10-CM | POA: Diagnosis present

## 2021-07-06 MED ORDER — AMOXICILLIN 500 MG PO CAPS
500.0000 mg | ORAL_CAPSULE | Freq: Once | ORAL | Status: AC
  Administered 2021-07-06: 500 mg via ORAL
  Filled 2021-07-06: qty 1

## 2021-07-06 MED ORDER — AMOXICILLIN 400 MG/5ML PO SUSR: 500.0000 mg | Freq: Two times a day (BID) | ORAL | 0 refills | Status: AC

## 2021-07-06 NOTE — ED Triage Notes (Signed)
Right ear pain today.

## 2021-07-06 NOTE — ED Provider Notes (Signed)
[facility] Provider Note   CSN: [id number] Arrival date & time: 07/06/21  2022     History  Chief Complaint  Patient presents with   Otalgia    Ann Robertson is a 31 y.o. female history of ESRD, last session today, full session here for evaluation of right ear pain.  Began this morning.  No drainage.  Described as an aching.  No tenderness, facial pain, fever, neck pain, neck stiffness.  No congestion, rhinorrhea, sore throat.  No recent sick contacts.  No shortness of breath.  No complications with dialysis earlier today  HPI     Home Medications Prior to Admission medications   Medication Sig Start Date End Date Taking? Authorizing Provider  amoxicillin (AMOXIL) 400 MG/5ML suspension Take 6.3 mLs (500 mg total) by mouth 2 (two) times daily for 7 days. 07/06/21 07/13/21 Yes Jaycelynn Knickerbocker A, PA-C  bisacodyl (DULCOLAX) 10 MG suppository Place 1 suppository (10 mg total) rectally daily as needed for moderate constipation. Patient not taking: Reported on 06/17/2021 02/21/21   Terrilee Croak, MD  Continuous Blood Gluc Sensor (DEXCOM G6 SENSOR) MISC Apply topically. 03/12/21   [provider]  Continuous Blood Gluc Sensor (DEXCOM G6 SENSOR) MISC  03/12/21   [provider]  Continuous Blood Gluc Transmit (DEXCOM G6 TRANSMITTER) MISC  02/22/21   [provider]  insulin glargine (LANTUS SOLOSTAR) 100 UNIT/ML Solostar Pen Inject 2-5 Units into the skin daily as needed (high BGL). Inject 2-5 units as needed for High glucose levels Patient taking differently: Inject 4 Units into the skin daily before breakfast. Inject 2-5 units as needed for High glucose levels 05/01/21   Starla Link, Kshitiz, MD  insulin lispro (HUMALOG) 100 UNIT/ML KwikPen Inject 1-6 Units into the skin See admin instructions. Inject 1-6 units up to three times daily with meals (only if eating) per sliding scale based on CBG and Carb count    [provider]  Insulin  Pen Needle (PEN NEEDLES) 30G X 8 MM MISC 1 each by Does not apply route daily. E11.9 05/14/19   Renato Shin, MD  labetalol (NORMODYNE) 100 MG tablet Take 100 mg by mouth 2 (two) times daily. 03/26/21   [provider]  levothyroxine (SYNTHROID) 25 MCG tablet Take 25 mcg by mouth daily before breakfast. 06/11/21   [provider]  methocarbamol (ROBAXIN) 750 MG tablet Take 750 mg by mouth every 8 (eight) hours as needed for muscle spasms. 03/16/21   [provider]  multivitamin (RENA-VIT) TABS tablet Take 1 tablet by mouth at bedtime. 06/19/21   Masters, Katie, DO  Nutritional Supplements (FEEDING SUPPLEMENT, NEPRO CARB STEADY,) LIQD Take 237 mLs by mouth 2 (two) times daily between meals. 06/20/21   Masters, Katie, DO  omeprazole (PRILOSEC) 20 MG capsule Take 20 mg by mouth daily as needed (acid reflux/heartburn).    [provider]  ondansetron (ZOFRAN) 4 MG tablet Take 1 tablet (4 mg total) by mouth every 8 (eight) hours as needed for nausea or vomiting. 06/10/20   Renato Shin, MD  oxyCODONE-acetaminophen (PERCOCET) 10-325 MG tablet Take 1 tablet by mouth every 4 (four) hours as needed for pain. 02/13/21   [provider]  rosuvastatin (CRESTOR) 20 MG tablet Take 20 mg by mouth at bedtime. 03/16/21   [provider]  torsemide (DEMADEX) 100 MG tablet 100 mg twice daily on non-dialysis days. 06/19/21   Masters, Katie, DO  Vitamin D, Ergocalciferol, (DRISDOL) 1.25 MG (50000 UNIT) CAPS capsule Take 50,000  Units by mouth every Tuesday.    [provider]      Allergies    Hydromorphone hcl    Review of Systems   Review of Systems  Constitutional: Negative.   HENT:  Positive for ear pain. Negative for congestion, drooling, ear discharge, hearing loss, mouth sores, nosebleeds, postnasal drip, rhinorrhea, sinus pressure, sinus pain, sore throat, trouble swallowing and voice change.   Eyes: Negative.   Respiratory: Negative.     Cardiovascular: Negative.   Gastrointestinal: Negative.   Genitourinary: Negative.   Musculoskeletal: Negative.   Skin: Negative.   Neurological: Negative.   All other systems reviewed and are negative.  Physical Exam Updated Vital Signs BP (!) 171/92 (BP Location: Right Arm)    Pulse (!) 110    Temp 98.8 F (37.1 C) (Oral)    Resp 18    Ht 5\' 5"  (1.651 m)    Wt 75 kg    SpO2 99%    BMI 27.51 kg/m  Physical Exam Vitals and nursing note reviewed.  Constitutional:      General: She is not in acute distress.    Appearance: She is well-developed. She is not ill-appearing, toxic-appearing or diaphoretic.  HENT:     Head: Normocephalic and atraumatic.     Jaw: There is normal jaw occlusion.     Comments: No drooling, dysphagia or trismus    Right Ear: No foreign body. No mastoid tenderness. No hemotympanum. Tympanic membrane is erythematous and bulging. Tympanic membrane is not perforated or retracted.     Left Ear: Tympanic membrane, ear canal and external ear normal. No mastoid tenderness. No hemotympanum.     Ears:     Comments: Left bulging, erythematous right TM with fluid level No middle ear effusion bl Left TM clear Nontender bilateral mastoid    Nose: Nose normal.     Mouth/Throat:     Lips: Pink.     Mouth: Mucous membranes are moist.     Pharynx: Oropharynx is clear. Uvula midline.     Tonsils: No tonsillar exudate or tonsillar abscesses.     Comments: Posterior oropharynx clear. Actively eating in room  Eyes:     Pupils: Pupils are equal, round, and reactive to light.  Neck:     Trachea: Trachea and phonation normal.     Comments: No neck stiffness neck rigidity.  No meningismus Cardiovascular:     Rate and Rhythm: Normal rate.     Pulses: Normal pulses.     Heart sounds: Normal heart sounds.  Pulmonary:     Effort: Pulmonary effort is normal. No respiratory distress.     Breath sounds: Normal breath sounds.  Chest:     Comments: Dialysis cath right upper  chest wall Abdominal:     General: Bowel sounds are normal. There is no distension.  Musculoskeletal:        General: Normal range of motion.     Cervical back: Full passive range of motion without pain and normal range of motion.  Skin:    General: Skin is warm and dry.  Neurological:     General: No focal deficit present.     Mental Status: She is alert.  Psychiatric:        Mood and Affect: Mood normal.    ED Results / Procedures / Treatments   Labs (all labs ordered are listed, but only abnormal results are displayed) Labs Reviewed - No data to display  EKG None  Radiology No results found.  Procedures Procedures    Medications Ordered in ED Medications  amoxicillin (AMOXIL) capsule 500 mg (500 mg Oral Given 07/06/21 2236)    ED Course/ Medical Decision Making/ A&P    31 year old here for evaluation of right-sided ear pain which began earlier today.  Does have complicated medical history including ESRD, last session earlier today without any complication.  She is afebrile, nonseptic, not ill-appearing.  She has no neck stiffness or neck rigidity. Left TM clear.  Right TM bulging, erythematous TM with fluid level.  No perforation.  Nontender bilateral mastoid.  She has no drainage to her ears.  Exam consistent with otitis media.  Low suspicion for malignant otitis media, mastoiditis, meningitis, perforation.  No pulsatile tinnitus to suggest dissection, aneurysm.  Will DC home with symptomatic management.  Encourage close outpatient follow-up, continued compliance with her dialysis.  She is agreeable.  The patient has been appropriately medically screened and/or stabilized in the ED. I have low suspicion for any other emergent medical condition which would require further screening, evaluation or treatment in the ED or require inpatient management.  Patient is hemodynamically stable and in no acute distress.  Patient able to ambulate in department prior to ED.  Evaluation  does not show acute pathology that would require ongoing or additional emergent interventions while in the emergency department or further inpatient treatment.  I have discussed the diagnosis with the patient and answered all questions.  Pain is been managed while in the emergency department and patient has no further complaints prior to discharge.  Patient is comfortable with plan discussed in room and is stable for discharge at this time.  I have discussed strict return precautions for returning to the emergency department.  Patient was encouraged to follow-up with PCP/specialist refer to at discharge.                           Medical Decision Making Amount and/or Complexity of Data Reviewed Independent Historian: friend External Data Reviewed: labs, radiology and notes.  Risk Prescription drug management. Risk Details: Do not feel patient needs additional labs, imaging, hospitalization at this time         Final Clinical Impression(s) / ED Diagnoses Final diagnoses:  Acute otitis media, unspecified otitis media type    Rx / DC Orders ED Discharge Orders          Ordered    amoxicillin (AMOXIL) 400 MG/5ML suspension  2 times daily        07/06/21 2300              Mariellen Blaney A, PA-C 07/06/21 2307    Luna Fuse, MD 07/11/21 1514

## 2021-07-06 NOTE — Discharge Instructions (Addendum)
Take the antibiotics as prescribed. Return for new or worsening symptoms. °

## 2021-07-23 ENCOUNTER — Encounter (HOSPITAL_COMMUNITY): Payer: Medicaid Other

## 2021-08-20 ENCOUNTER — Other Ambulatory Visit: Payer: Self-pay | Admitting: Internal Medicine

## 2022-02-08 ENCOUNTER — Other Ambulatory Visit: Payer: Self-pay

## 2022-02-08 ENCOUNTER — Emergency Department (HOSPITAL_COMMUNITY): Payer: Medicare Other

## 2022-02-08 ENCOUNTER — Inpatient Hospital Stay (HOSPITAL_COMMUNITY): Payer: Medicare Other

## 2022-02-08 ENCOUNTER — Encounter (HOSPITAL_COMMUNITY): Payer: Self-pay

## 2022-02-08 ENCOUNTER — Inpatient Hospital Stay (HOSPITAL_COMMUNITY)
Admission: EM | Admit: 2022-02-08 | Discharge: 2022-02-11 | DRG: 637 | Disposition: A | Discharge status: Home / Self Care | Payer: Medicare Other | Attending: Student in an Organized Health Care Education/Training Program | Admitting: Student in an Organized Health Care Education/Training Program

## 2022-02-08 DIAGNOSIS — Z79890 Hormone replacement therapy (postmenopausal): Secondary | ICD-10-CM | POA: Diagnosis not present

## 2022-02-08 DIAGNOSIS — E11319 Type 2 diabetes mellitus with unspecified diabetic retinopathy without macular edema: Secondary | ICD-10-CM | POA: Diagnosis not present

## 2022-02-08 DIAGNOSIS — Z992 Dependence on renal dialysis: Secondary | ICD-10-CM | POA: Diagnosis not present

## 2022-02-08 DIAGNOSIS — Z8249 Family history of ischemic heart disease and other diseases of the circulatory system: Secondary | ICD-10-CM | POA: Diagnosis not present

## 2022-02-08 DIAGNOSIS — E1042 Type 1 diabetes mellitus with diabetic polyneuropathy: Secondary | ICD-10-CM | POA: Diagnosis present

## 2022-02-08 DIAGNOSIS — E7800 Pure hypercholesterolemia, unspecified: Secondary | ICD-10-CM | POA: Diagnosis present

## 2022-02-08 DIAGNOSIS — N186 End stage renal disease: Secondary | ICD-10-CM | POA: Diagnosis present

## 2022-02-08 DIAGNOSIS — E8770 Fluid overload, unspecified: Secondary | ICD-10-CM | POA: Diagnosis present

## 2022-02-08 DIAGNOSIS — G9341 Metabolic encephalopathy: Secondary | ICD-10-CM | POA: Diagnosis present

## 2022-02-08 DIAGNOSIS — Z79899 Other long term (current) drug therapy: Secondary | ICD-10-CM | POA: Diagnosis not present

## 2022-02-08 DIAGNOSIS — E1065 Type 1 diabetes mellitus with hyperglycemia: Secondary | ICD-10-CM | POA: Diagnosis present

## 2022-02-08 DIAGNOSIS — Z794 Long term (current) use of insulin: Secondary | ICD-10-CM | POA: Diagnosis not present

## 2022-02-08 DIAGNOSIS — E876 Hypokalemia: Secondary | ICD-10-CM | POA: Diagnosis present

## 2022-02-08 DIAGNOSIS — Z83438 Family history of other disorder of lipoprotein metabolism and other lipidemia: Secondary | ICD-10-CM

## 2022-02-08 DIAGNOSIS — E1049 Type 1 diabetes mellitus with other diabetic neurological complication: Secondary | ICD-10-CM | POA: Diagnosis present

## 2022-02-08 DIAGNOSIS — G928 Other toxic encephalopathy: Secondary | ICD-10-CM | POA: Diagnosis present

## 2022-02-08 DIAGNOSIS — T424X5A Adverse effect of benzodiazepines, initial encounter: Secondary | ICD-10-CM | POA: Diagnosis present

## 2022-02-08 DIAGNOSIS — E1165 Type 2 diabetes mellitus with hyperglycemia: Secondary | ICD-10-CM | POA: Diagnosis not present

## 2022-02-08 DIAGNOSIS — E1022 Type 1 diabetes mellitus with diabetic chronic kidney disease: Secondary | ICD-10-CM | POA: Diagnosis present

## 2022-02-08 DIAGNOSIS — E10319 Type 1 diabetes mellitus with unspecified diabetic retinopathy without macular edema: Secondary | ICD-10-CM | POA: Diagnosis present

## 2022-02-08 DIAGNOSIS — Z833 Family history of diabetes mellitus: Secondary | ICD-10-CM

## 2022-02-08 DIAGNOSIS — I120 Hypertensive chronic kidney disease with stage 5 chronic kidney disease or end stage renal disease: Secondary | ICD-10-CM | POA: Diagnosis present

## 2022-02-08 DIAGNOSIS — Z823 Family history of stroke: Secondary | ICD-10-CM | POA: Diagnosis not present

## 2022-02-08 DIAGNOSIS — E10649 Type 1 diabetes mellitus with hypoglycemia without coma: Secondary | ICD-10-CM | POA: Diagnosis not present

## 2022-02-08 DIAGNOSIS — Z87891 Personal history of nicotine dependence: Secondary | ICD-10-CM | POA: Diagnosis not present

## 2022-02-08 DIAGNOSIS — G253 Myoclonus: Secondary | ICD-10-CM | POA: Diagnosis present

## 2022-02-08 DIAGNOSIS — R739 Hyperglycemia, unspecified: Principal | ICD-10-CM | POA: Diagnosis present

## 2022-02-08 DIAGNOSIS — N2581 Secondary hyperparathyroidism of renal origin: Secondary | ICD-10-CM | POA: Diagnosis present

## 2022-02-08 DIAGNOSIS — E1021 Type 1 diabetes mellitus with diabetic nephropathy: Secondary | ICD-10-CM | POA: Diagnosis not present

## 2022-02-08 DIAGNOSIS — D649 Anemia, unspecified: Secondary | ICD-10-CM | POA: Diagnosis present

## 2022-02-08 DIAGNOSIS — G9340 Encephalopathy, unspecified: Secondary | ICD-10-CM | POA: Diagnosis not present

## 2022-02-08 LAB — COMPREHENSIVE METABOLIC PANEL
ALT: 19 U/L (ref 0–44)
AST: 24 U/L (ref 15–41)
Albumin: 2.5 g/dL — ABNORMAL LOW (ref 3.5–5.0)
Alkaline Phosphatase: 198 U/L — ABNORMAL HIGH (ref 38–126)
Anion gap: 17 — ABNORMAL HIGH (ref 5–15)
BUN: 35 mg/dL — ABNORMAL HIGH (ref 6–20)
CO2: 21 mmol/L — ABNORMAL LOW (ref 22–32)
Calcium: 8.3 mg/dL — ABNORMAL LOW (ref 8.9–10.3)
Chloride: 91 mmol/L — ABNORMAL LOW (ref 98–111)
Creatinine, Ser: 6.11 mg/dL — ABNORMAL HIGH (ref 0.44–1.00)
GFR, Estimated: 9 mL/min — ABNORMAL LOW (ref >60)
Glucose, Bld: 560 mg/dL (ref 70–99)
Potassium: 3.2 mmol/L — ABNORMAL LOW (ref 3.5–5.1)
Sodium: 129 mmol/L — ABNORMAL LOW (ref 135–145)
Total Bilirubin: 1.1 mg/dL (ref 0.3–1.2)
Total Protein: 5.5 g/dL — ABNORMAL LOW (ref 6.5–8.1)

## 2022-02-08 LAB — I-STAT ARTERIAL BLOOD GAS, ED
Acid-base deficit: 2 mmol/L (ref 0.0–2.0)
Bicarbonate: 24.3 mmol/L (ref 20.0–28.0)
Calcium, Ion: 1.16 mmol/L (ref 1.15–1.40)
HCT: 38 % (ref 36.0–46.0)
Hemoglobin: 12.9 g/dL (ref 12.0–15.0)
O2 Saturation: 96 %
Patient temperature: 97.2
Potassium: 3.2 mmol/L — ABNORMAL LOW (ref 3.5–5.1)
Sodium: 124 mmol/L — ABNORMAL LOW (ref 135–145)
TCO2: 26 mmol/L (ref 22–32)
pCO2 arterial: 43.3 mmHg (ref 32–48)
pH, Arterial: 7.353 (ref 7.35–7.45)
pO2, Arterial: 83 mmHg (ref 83–108)

## 2022-02-08 LAB — CBC WITH DIFFERENTIAL/PLATELET
Abs Immature Granulocytes: 0.01 10*3/uL (ref 0.00–0.07)
Basophils Absolute: 0 10*3/uL (ref 0.0–0.1)
Basophils Relative: 1 %
Eosinophils Absolute: 0.1 10*3/uL (ref 0.0–0.5)
Eosinophils Relative: 2 %
HCT: 35.7 % — ABNORMAL LOW (ref 36.0–46.0)
Hemoglobin: 11.9 g/dL — ABNORMAL LOW (ref 12.0–15.0)
Immature Granulocytes: 0 %
Lymphocytes Relative: 27 %
Lymphs Abs: 1.2 10*3/uL (ref 0.7–4.0)
MCH: 29.5 pg (ref 26.0–34.0)
MCHC: 33.3 g/dL (ref 30.0–36.0)
MCV: 88.4 fL (ref 80.0–100.0)
Monocytes Absolute: 0.3 10*3/uL (ref 0.1–1.0)
Monocytes Relative: 6 %
Neutro Abs: 2.8 10*3/uL (ref 1.7–7.7)
Neutrophils Relative %: 64 %
Platelets: 162 10*3/uL (ref 150–400)
RBC: 4.04 MIL/uL (ref 3.87–5.11)
RDW: 15.3 % (ref 11.5–15.5)
WBC: 4.4 10*3/uL (ref 4.0–10.5)
nRBC: 0 % (ref 0.0–0.2)

## 2022-02-08 LAB — I-STAT CHEM 8, ED
BUN: 49 mg/dL — ABNORMAL HIGH (ref 6–20)
Calcium, Ion: 0.92 mmol/L — ABNORMAL LOW (ref 1.15–1.40)
Chloride: 86 mmol/L — ABNORMAL LOW (ref 98–111)
Creatinine, Ser: 6.3 mg/dL — ABNORMAL HIGH (ref 0.44–1.00)
Glucose, Bld: >700 mg/dL (ref 70–99)
HCT: 44 % (ref 36.0–46.0)
Hemoglobin: 15 g/dL (ref 12.0–15.0)
Potassium: 4.9 mmol/L (ref 3.5–5.1)
Sodium: 121 mmol/L — ABNORMAL LOW (ref 135–145)
TCO2: 26 mmol/L (ref 22–32)

## 2022-02-08 LAB — CBG MONITORING, ED
Glucose-Capillary: >600 mg/dL (ref 70–99)
Glucose-Capillary: >600 mg/dL (ref 70–99)
Glucose-Capillary: >600 mg/dL (ref 70–99)
Glucose-Capillary: >600 mg/dL (ref 70–99)
Glucose-Capillary: >600 mg/dL (ref 70–99)
Glucose-Capillary: >600 mg/dL (ref 70–99)
Glucose-Capillary: >600 mg/dL (ref 70–99)
Glucose-Capillary: >600 mg/dL (ref 70–99)
Glucose-Capillary: >600 mg/dL (ref 70–99)
Glucose-Capillary: >600 mg/dL (ref 70–99)
Glucose-Capillary: >600 mg/dL (ref 70–99)
Glucose-Capillary: 582 mg/dL (ref 70–99)

## 2022-02-08 LAB — HEMOGLOBIN A1C
Hgb A1c MFr Bld: 14.3 % — ABNORMAL HIGH (ref 4.8–5.6)
Mean Plasma Glucose: 363.71 mg/dL

## 2022-02-08 LAB — LACTIC ACID, PLASMA: Lactic Acid, Venous: 5.1 mmol/L (ref 0.5–1.9)

## 2022-02-08 LAB — GLUCOSE, CAPILLARY
Glucose-Capillary: 462 mg/dL — ABNORMAL HIGH (ref 70–99)
Glucose-Capillary: 472 mg/dL — ABNORMAL HIGH (ref 70–99)
Glucose-Capillary: 400 mg/dL — ABNORMAL HIGH (ref 70–99)

## 2022-02-08 LAB — BETA-HYDROXYBUTYRIC ACID: Beta-Hydroxybutyric Acid: 1.08 mmol/L — ABNORMAL HIGH (ref 0.05–0.27)

## 2022-02-08 LAB — OSMOLALITY: Osmolality: 306 mOsm/kg — ABNORMAL HIGH (ref 275–295)

## 2022-02-08 MED ORDER — LORAZEPAM 2 MG/ML IJ SOLN
1.0000 mg | Freq: Once | INTRAMUSCULAR | Status: AC
  Administered 2022-02-08: 1 mg via INTRAMUSCULAR
  Filled 2022-02-08: qty 1

## 2022-02-08 MED ORDER — LACTATED RINGERS IV BOLUS
250.0000 mL | Freq: Once | INTRAVENOUS | Status: AC
  Administered 2022-02-08: 250 mL via INTRAVENOUS

## 2022-02-08 MED ORDER — HEPARIN SODIUM (PORCINE) 5000 UNIT/ML IJ SOLN
5000.0000 [IU] | Freq: Three times a day (TID) | INTRAMUSCULAR | Status: DC
  Administered 2022-02-08 – 2022-02-09 (×3): 5000 [IU] via SUBCUTANEOUS
  Filled 2022-02-08 (×6): qty 1

## 2022-02-08 MED ORDER — LEVOTHYROXINE SODIUM 25 MCG PO TABS
25.0000 ug | ORAL_TABLET | Freq: Every day | ORAL | Status: DC
  Administered 2022-02-09: 25 ug via ORAL
  Filled 2022-02-08 (×2): qty 1

## 2022-02-08 MED ORDER — DEXTROSE 50 % IV SOLN
0.0000 mL | INTRAVENOUS | Status: DC | PRN
  Administered 2022-02-09: 45 mL via INTRAVENOUS
  Administered 2022-02-09: 50 mL via INTRAVENOUS
  Administered 2022-02-09: 60 mL via INTRAVENOUS
  Administered 2022-02-09: 80 mL via INTRAVENOUS
  Administered 2022-02-09: 50 mL via INTRAVENOUS
  Filled 2022-02-08 (×3): qty 50

## 2022-02-08 MED ORDER — LACTATED RINGERS IV BOLUS
1000.0000 mL | Freq: Once | INTRAVENOUS | Status: AC
  Administered 2022-02-08: 1000 mL via INTRAVENOUS

## 2022-02-08 MED ORDER — POTASSIUM CHLORIDE 10 MEQ/100ML IV SOLN
10.0000 meq | INTRAVENOUS | Status: AC
  Administered 2022-02-08 (×3): 10 meq via INTRAVENOUS
  Filled 2022-02-08 (×3): qty 100

## 2022-02-08 MED ORDER — ACETAMINOPHEN 325 MG PO TABS
650.0000 mg | ORAL_TABLET | Freq: Four times a day (QID) | ORAL | Status: DC | PRN
  Administered 2022-02-10 (×3): 650 mg via ORAL
  Filled 2022-02-08 (×2): qty 2

## 2022-02-08 MED ORDER — AMLODIPINE BESYLATE 10 MG PO TABS
10.0000 mg | ORAL_TABLET | Freq: Every day | ORAL | Status: DC
  Administered 2022-02-08 – 2022-02-10 (×3): 10 mg via ORAL
  Filled 2022-02-08 (×3): qty 1

## 2022-02-08 MED ORDER — ACETAMINOPHEN 650 MG RE SUPP: 650.0000 mg | Freq: Four times a day (QID) | RECTAL | Status: DC | PRN

## 2022-02-08 MED ORDER — ROSUVASTATIN CALCIUM 20 MG PO TABS
20.0000 mg | ORAL_TABLET | Freq: Every day | ORAL | Status: DC
  Administered 2022-02-08 – 2022-02-10 (×3): 20 mg via ORAL
  Filled 2022-02-08 (×3): qty 1

## 2022-02-08 MED ORDER — HEPARIN SODIUM (PORCINE) 5000 UNIT/ML IJ SOLN: 5000.0000 [IU] | Freq: Three times a day (TID) | INTRAMUSCULAR | Status: DC

## 2022-02-08 MED ORDER — LABETALOL HCL 100 MG PO TABS
100.0000 mg | ORAL_TABLET | Freq: Two times a day (BID) | ORAL | Status: DC
  Administered 2022-02-08 – 2022-02-11 (×6): 100 mg via ORAL
  Filled 2022-02-08 (×7): qty 1

## 2022-02-08 MED ORDER — INSULIN REGULAR(HUMAN) IN NACL 100-0.9 UT/100ML-% IV SOLN
INTRAVENOUS | Status: DC
  Administered 2022-02-08: 6.5 [IU]/h via INTRAVENOUS
  Filled 2022-02-08: qty 100

## 2022-02-08 NOTE — ED Provider Triage Note (Signed)
Emergency Medicine Provider Triage Evaluation Note  Ann Robertson , a 31 y.o. female  was evaluated in triage.  Pt complains of spasms in left arm.  She was was at dialysis today, but was sent here due to the absence in her left arm.  She says she had associated cramping.  She says this is never happened before.  She is not able to control her arm.  Says that she woke up with the symptoms. She has not missed any dialysis sessions. Review of Systems  Positive:  Negative:   Physical Exam  BP (!) 175/107   Pulse 88   Temp (!) 97.4 F (36.3 C) (Oral)   Resp 14   Ht 5\' 5"  (1.651 m)   Wt 74.8 kg   SpO2 98%   BMI 27.46 kg/m  Gen:   Awake, no distress   Resp:  Normal effort  MSK:   Moves extremities without difficulty  Other:  Radial pulse 2 +, sensation intact, cap refill intact. Repetitive spasms noted in right forearm   Medical Decision Making  Medically screening exam initiated at 10:41 AM.  Appropriate orders placed.  Ann Robertson was informed that the remainder of the evaluation will be completed by another provider, this initial triage assessment does not replace that evaluation, and the importance of remaining in the ED until their evaluation is complete.      Ann Birchwood, PA-C 02/08/22 1043

## 2022-02-08 NOTE — Hospital Course (Addendum)
Hyperglycemic Hd last full tx Friday Went to hd today, having severe spasming left arm pain, sent to ED, complained of severe headache. Got ativan for arm pain, now sleepy. Drowsy but does respond to deep nail bed pressure.  No fluids, bc she is hd.  Recently started on lyrica    Patient somulent

## 2022-02-08 NOTE — ED Provider Notes (Signed)
[facility] EMERGENCY DEPARTMENT Provider Note   CSN: [id number] Arrival date & time: 02/08/22  1027     History  Chief Complaint  Patient presents with   Spasms    Ann Robertson is a 31 y.o. female.  31 year old female with past medical history of ESRD (on dialysis, Monday/Wednesday/Friday, last full dialysis on Friday), Diabetes, hyperlipidemia, presents with complaint of left arm spasming.  Patient was scheduled for dialysis today, went to her dialysis center and was sent to the ER due to significant cramping and spasming in her left arm.  Level 5 caveat applies as patient is a difficult historian, is uncomfortable and unable to answer questions.       Home Medications Prior to Admission medications   Medication Sig Start Date End Date Taking? Authorizing Provider  amLODipine (NORVASC) 10 MG tablet Take 10 mg by mouth at bedtime. 08/24/21   [provider]  bisacodyl (DULCOLAX) 10 MG suppository Place 1 suppository (10 mg total) rectally daily as needed for moderate constipation. Patient not taking: Reported on 06/17/2021 02/21/21   Terrilee Croak, MD  Continuous Blood Gluc Sensor (DEXCOM G6 SENSOR) MISC Apply topically. 03/12/21   [provider]  Continuous Blood Gluc Sensor (DEXCOM G6 SENSOR) MISC  03/12/21   [provider]  Continuous Blood Gluc Transmit (DEXCOM G6 TRANSMITTER) MISC  02/22/21   [provider]  fluconazole (DIFLUCAN) 100 MG tablet Take 100 mg by mouth daily. 12/14/21   [provider]  gabapentin (NEURONTIN) 600 MG tablet Take 600 mg by mouth 3 (three) times daily. 10/13/21   [provider]  insulin glargine (LANTUS SOLOSTAR) 100 UNIT/ML Solostar Pen Inject 2-5 Units into the skin daily as needed (high BGL). Inject 2-5 units as needed for High glucose levels Patient taking differently: Inject 4 Units into the skin daily before breakfast. Inject 2-5 units as needed for High glucose levels  05/01/21   Starla Link, Kshitiz, MD  insulin lispro (HUMALOG) 100 UNIT/ML KwikPen Inject 1-6 Units into the skin See admin instructions. Inject 1-6 units up to three times daily with meals (only if eating) per sliding scale based on CBG and Carb count    [provider]  Insulin Pen Needle (PEN NEEDLES) 30G X 8 MM MISC 1 each by Does not apply route daily. E11.9 05/14/19   Renato Shin, MD  KLOXXADO 8 MG/0.1ML LIQD Place into both nostrils. 10/14/21   [provider]  labetalol (NORMODYNE) 100 MG tablet Take 100 mg by mouth 2 (two) times daily. 03/26/21   [provider]  levothyroxine (SYNTHROID) 25 MCG tablet Take 25 mcg by mouth daily before breakfast. 06/11/21   [provider]  lidocaine-prilocaine (EMLA) cream Apply topically. 11/17/21   [provider]  methocarbamol (ROBAXIN) 750 MG tablet Take 750 mg by mouth every 8 (eight) hours as needed for muscle spasms. 03/16/21   [provider]  multivitamin (RENA-VIT) TABS tablet Take 1 tablet by mouth at bedtime. 06/19/21   Masters, Katie, DO  Nutritional Supplements (FEEDING SUPPLEMENT, NEPRO CARB STEADY,) LIQD Take 237 mLs by mouth 2 (two) times daily between meals. 06/20/21   Masters, Katie, DO  omeprazole (PRILOSEC) 20 MG capsule Take 20 mg by mouth daily as needed (acid reflux/heartburn).    [provider]  ondansetron (ZOFRAN) 4 MG tablet Take 1 tablet (4 mg total) by mouth every 8 (eight) hours as needed for nausea or vomiting. 06/10/20   Renato Shin, MD  oxyCODONE-acetaminophen (PERCOCET) 10-325 MG tablet  Take 1 tablet by mouth every 4 (four) hours as needed for pain. 02/13/21   [provider]  pregabalin (LYRICA) 25 MG capsule Take by mouth at bedtime. 02/05/22   [provider]  pregabalin (LYRICA) 50 MG capsule Take 50 mg by mouth at bedtime. 02/03/22   [provider]  rosuvastatin (CRESTOR) 20 MG tablet Take 20 mg by mouth at bedtime. 03/16/21   [provider]  torsemide (DEMADEX) 100 MG tablet 100 mg twice daily on non-dialysis days. 06/19/21   Masters, Katie, DO  Vitamin D, Ergocalciferol, (DRISDOL) 1.25 MG (50000 UNIT) CAPS capsule Take 50,000 Units by mouth every Tuesday.    [provider]      Allergies    Hydromorphone hcl    Review of Systems   Review of Systems Level 5 caveat, poor historian/unable to answer questions Physical Exam Updated Vital Signs BP (!) 165/95   Pulse 82   Temp (!) 97.4 F (36.3 C) (Oral)   Resp 14   Ht 5\' 5"  (1.651 m)   Wt 74.8 kg   SpO2 95%   BMI 27.46 kg/m  Physical Exam Vitals and nursing note reviewed.  Constitutional:      General: She is not in acute distress.    Appearance: She is well-developed. She is not diaphoretic.  HENT:     Head: Normocephalic and atraumatic.     Mouth/Throat:     Mouth: Mucous membranes are moist.  Cardiovascular:     Rate and Rhythm: Normal rate and regular rhythm.     Pulses: Normal pulses.     Heart sounds: Normal heart sounds.  Pulmonary:     Effort: Pulmonary effort is normal.     Breath sounds: Normal breath sounds.  Abdominal:     Palpations: Abdomen is soft.     Tenderness: There is no abdominal tenderness.  Musculoskeletal:     Cervical back: Neck supple.     Right lower leg: No edema.     Left lower leg: No edema.  Skin:    General: Skin is warm and dry.     Findings: No erythema or rash.     Comments: Left arm fistula with palpable thrill  Neurological:     General: No focal deficit present.     Mental Status: She is alert.     Sensory: No sensory deficit.     Motor: No weakness.     Comments: Moves extremities however holds left arm extended with assistance from family attempting to find position of comfort. Spasms to legs with push/pull of feet Negative Chvostex sign Negative Trousseau's  Psychiatric:        Behavior: Behavior normal.     ED Results / Procedures / Treatments   Labs (all labs ordered are  listed, but only abnormal results are displayed) Labs Reviewed  CBC WITH DIFFERENTIAL/PLATELET - Abnormal; Notable for the following components:      Result Value   Hemoglobin 11.9 (*)    HCT 35.7 (*)    All other components within normal limits  I-STAT CHEM 8, ED - Abnormal; Notable for the following components:   Sodium 121 (*)    Chloride 86 (*)    BUN 49 (*)    Creatinine, Ser 6.30 (*)    Glucose, Bld >700 (*)    Calcium, Ion 0.92 (*)    All other components within normal limits  CBG MONITORING, ED - Abnormal; Notable for the following components:   Glucose-Capillary >600 (*)  All other components within normal limits  BASIC METABOLIC PANEL  CK  MAGNESIUM  URINALYSIS, ROUTINE W REFLEX MICROSCOPIC  I-STAT BETA HCG BLOOD, ED ([facility], [facility], AP ONLY)  I-STAT VENOUS BLOOD GAS, ED    EKG EKG Interpretation  Date/Time:  Monday February 08 2022 12:26:57 EDT Ventricular Rate:  94 PR Interval:  175 QRS Duration: 98 QT Interval:  460 QTC Calculation: 576 R Axis:   134 Text Interpretation: Sinus rhythm Probable left atrial enlargement Right axis deviation Borderline T abnormalities, lateral leads Prolonged QT interval Confirmed by Regan Lemming ([id number]) on 02/08/2022 1:05:31 PM  Radiology CT Head Wo Contrast  Result Date: 02/08/2022 CLINICAL DATA:  A 31 year old female presents for evaluation of headache, sudden and severe headache. EXAM: CT HEAD WITHOUT CONTRAST TECHNIQUE: Contiguous axial images were obtained from the base of the skull through the vertex without intravenous contrast. RADIATION DOSE REDUCTION: This exam was performed according to the departmental dose-optimization program which includes automated exposure control, adjustment of the mA and/or kV according to patient size and/or use of iterative reconstruction technique. COMPARISON:  November of 2022. FINDINGS: Brain: No evidence of acute infarction, hemorrhage, hydrocephalus, extra-axial collection or mass lesion/mass  effect. Vascular: No hyperdense vessel or unexpected calcification. Skull: Normal. Negative for fracture or focal lesion. Sinuses/Orbits: Visualized paranasal sinuses and orbits are unremarkable to the extent evaluated. Other: None. IMPRESSION: No acute intracranial abnormality. Electronically Signed   By: Zetta Bills M.D.   On: 02/08/2022 13:26    Procedures .Critical Care  Performed by: Tacy Learn, PA-C Authorized by: Tacy Learn, PA-C   Critical care provider statement:    Critical care time (minutes):  30   Critical care was time spent personally by me on the following activities:  Development of treatment plan with patient or surrogate, discussions with consultants, evaluation of patient's response to treatment, examination of patient, ordering and review of laboratory studies, ordering and review of radiographic studies, ordering and performing treatments and interventions, pulse oximetry, re-evaluation of patient's condition and review of old charts     Medications Ordered in ED Medications  insulin regular, human (MYXREDLIN) 100 units/ 100 mL infusion (6.5 Units/hr Intravenous New Bag/Given 02/08/22 1502)  dextrose 50 % solution 0-50 mL (has no administration in time range)  LORazepam (ATIVAN) injection 1 mg (1 mg Intramuscular Given 02/08/22 1214)    ED Course/ Medical Decision Making/ A&P Clinical Course as of 02/08/22 1503  Mon Feb 08, 2022  1132 Reports sudden onset 10/10 headache while in triage. Ordering head CT. Spasms are also worsening and now affecting her neck. She is alert during these episodes.  We are unable to get labs due to spasms.  I have requested room for further management.  [GL]  7673 MWF dialysis. Last Friday. Spasms to left arm, didn't do dialysis. S/p ativan. Concern for HHS. Glucose > 700.  [ML]    Clinical Course User Index [GL] Sherre Poot Adora Fridge, PA-C [ML] Rosine Abe, MD                           Medical Decision Making Amount and/or  Complexity of Data Reviewed Labs: ordered.  Risk Prescription drug management. Decision regarding hospitalization.   This patient presents to the ED for concern of left arm spasm, this involves an extensive number of treatment options, and is a complaint that carries with it a high risk of complications and morbidity.  The differential diagnosis includes but not  limited to HHS, seizure, intracranial abnormality given headache today   Co morbidities that complicate the patient evaluation  Dialysis, diabetes hyperlipidemia   Additional history obtained:  Additional history obtained from Kimber Relic, PA-C with nephrology service who reports starting Lyrica last week at 50 mg for myoclonic body movements which seems to help however made patient drowsy and dose was decreased to 25 mg. External records from outside source obtained and reviewed including office visit dated 02/04/2022 at [facility] for ophthalmology   Lab Tests:  I Ordered, and personally interpreted labs.  The pertinent results include: CBC unremarkable.  I-STAT Chem-8 with glucose greater than 700.  Potassium normal, sodium 121, likely related to her hyperglycemia.  Slightly worsening creatinine although this is an i-STAT lab result. Initial labs were hemolyzed, have been repeated.   Imaging Studies ordered:  I ordered imaging studies including CT head I independently visualized and interpreted imaging which showed no acute abnormality I agree with the radiologist interpretation   Consultations Obtained:  I requested consultation with the internal medicine teaching service,  and discussed lab and imaging findings as well as pertinent plan - they recommend: Will consult for admission   Problem List / ED Course / Critical interventions / Medication management  31 year old female with history of ESRD, uncontrolled diabetes presents with complaint of left arm muscle spasms.  Patient was seen in nephrology clinic  recently with concern for myoclonic jerking, given Lyrica thinking could be related to neuropathy.  Patient took her 50 mg dose and fell like this helped but made her groggy.  She then had a dose decreased to 25 mg.  Unsure if this is related to her presentation today.  She arrives with left arm jerking, left side facial twitching and reported a severe headache in triage.  Patient did obtain a CT of the head upon arrival which was negative for acute findings.  She was unable to have lab draw due to the severe pain in her left arm and for this she was given Ativan with improvement.  Following the Ativan, patient is sleepy, does respond to painful stimuli.  Question if this mental status changes secondary to her hyperglycemia versus the Ativan administration.  Patient is found to have a blood sugar of greater than 700, her potassium is normal, sodium low at 121, likely related to her hyperglycemia.  Due to her dialysis status, she is not given IV fluids but is started on an insulin drip.  Case was discussed with internal medicine teaching service who will consult for admission. I ordered medication including insulin drip for hyperglycemia.  Ativan for muscle spasms Reevaluation of the patient after these medicines showed that the patient improved I have reviewed the patients home medicines and have made adjustments as needed   Social Determinants of Health:  Has primary care and specialty team   Test / Admission - Considered:  Admit for further work-up and monitoring        Final Clinical Impression(s) / ED Diagnoses Final diagnoses:  Hyperglycemia  End stage renal disease [facility])    Rx / DC Orders ED Discharge Orders     None         Tacy Learn, PA-C 02/08/22 1503    Regan Lemming, MD 02/08/22 1516

## 2022-02-08 NOTE — ED Notes (Signed)
CBG over 600.  I-stat at 700 in mini lab.  PA [facility] aware.  Blood sent to main lab for confirmation.  No new orders at this time.

## 2022-02-08 NOTE — ED Notes (Signed)
Unsuccessful multiple sticks for blood work by this RN, NT and phlebotomist.  MD Evette Doffing aware.  No new orders at this time.

## 2022-02-08 NOTE — ED Triage Notes (Signed)
Complains she woke up this am with left hand and arm cramping.  Reports it was jerking.  Patient is a dialysis patient. Was supposed to have dialysis this am but dialysis sent her here.  Patient always has high cbg >500 with ems.

## 2022-02-08 NOTE — Progress Notes (Signed)
Aware that Ms. Kaminsky is being admitted for uncontrolled DM. The dialysis team is aware. Due to high census -> she will be dialyzed in the morning (9/12). Orders have been placed. Full consult to come at that time.  Veneta Penton, PA-C [employer] Pager [phone]

## 2022-02-08 NOTE — Progress Notes (Signed)
IVT consulted for difficult stick; pt has two PIV's charted (RADF and RH).  Spoke w/RN, Raymar who advised IVT pt needs a larger PIV for potassium runs.  Stated one PIV is near knuckle and other is too small for bolus and potassium.  Advised RN, Raymar potassium is best run through smaller bore PIV r/t diluent in vessel.  Advised RN IVT will come assess to place new PIV, blood is not guaranteed.

## 2022-02-08 NOTE — H&P (Cosign Needed Addendum)
Date: 02/08/2022               Patient Name:  Ann Robertson MRN: [id number]  DOB: 07-17-1990 Age / Sex: 31 y.o., female   PCP: [facility] [place] Service: Internal Medicine Teaching Service              Attending Physician: Dr. Evette Doffing, Mallie Mussel, *    First Contact: Hennie Duos, [place]ie 3 Pager: [phone]  Second Contact: Dr. Delene Ruffini Pager: [phone]            After Hours (After 5p/  First Contact Pager: [phone]  weekends / holidays): Second Contact Pager: [phone]   Chief Complaint: left arm spasm  History of Present Illness:   31 year old female with a PMHx of ESRD (on dialysis, Monday/Wednesday/Friday, last full dialysis on Friday), type 1 DM, and hyperlipidemia presents for left arm spasms and admitted for hyperglycemia.   Patient very drowsy on exam so history is provided via EMR and father. Patient reportedly woke up this morning with cramping and spasms of her left arm. Per father, she has never had this problem before. Patient later went to dialysis  today where she continued to experience left arm spasms and pain. She was directed to ED due to severe pain and did not receive HD tx. She was still experiencing severe pain on arrival to ED, but received Ativan and subsequently became very drowsy. Prior to change in mental stats, she did endorse headache to ED provider.  Of note, recently seen by nephrology for muscle spasms where she was started on Lyrica 50mg  02/03/22. Although lyrica helped, patient became very drowsy. Dose was subsequently decreased to 25mg  which patient has been taking.   Meds:  Per chart review - amlodipine 10 mg x1 daily - bisacodyl 10 mg x1 daily - fluconazole 100 mg x1 daily - gabapentin 600 mg x3 daily - insulin glargine 4 units daily and sliding scale - insulin lispro sliding scale with meals - labetalol 100 mg - levothyroxine 25 mcg daily - lidocaine- prilocaine cream - methocarbamol 750 mg -  multivitamin - omeprazole 20 mg - odansetron 4 mg - oxycodone-acetaminophen PRN - pregabalin 50 mg daily - rosuvastain 20 mg daily - torsemide 100 mg x2 daily - vitamin D 1.25 mg x1 weekly  No outpatient medications have been marked as taking for the 02/08/22 encounter [facility] Encounter).   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   Review of Systems: Unable to obtain  Physical Exam:  GENERAL:  Sleeping. Arouses only to sternal rub and nailbed compression HENT:     Head: Normocephalic and atraumatic.     Mouth/Throat:     Mouth: Mucous membranes are moist.     Pharynx: Oropharynx is clear.  Eyes:  Pupils: Pupils are equal, round, and reactive to light.  Cardiovascular:     Rate and Rhythm: Normal rate and regular rhythm.     Pulses: Normal pulses. Palpable thrill in left arm fistula    Heart sounds: High flow murmur Pulmonary:     Effort: Pulmonary effort is normal.     Breath sounds: Normal breath sounds.  Abdominal:     General: Abdomen is flat. Bowel sounds are normal. There is no distension.     Tenderness: There is no abdominal tenderness. There is no guarding or rebound.  Musculoskeletal:        General: No swelling.  Skin:    General: Decreased skin turgor. Skin is warm and dry.  Neurological:     General: No focal deficit present.     Mental Status: Sleeping. Only arouses to painful stimuli   EKG:  personally reviewed my interpretation is sinus rhythm with left atrial enlargement and right axis deviation. Prolonged QT interval.    Assessment & Plan by Problem: Principal Problem:   Hyperglycemia Active Problems:   Hyperglycemia due to diabetes mellitus ([place])  31 year old female with a PMHx of ESRD, type 1 DM, and hyperlipidemia presents for left arm spasms and admitted for hyperglycemia.   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 Multiple complications. Home insulin regimen is Lantus 2-5 units daily as needed, humalog 1-6 units TID with meals. Poor adherence.  Favor HHS over DKA, osmolality is high and not overtly acidotic. Uncertain trigger, though given poorly controlled DM, suspect medication non-adherence. Unable to calculate GAP.  Received 1L bolus in ED > Endotool  > will hold on fluids given hx ESRD on HD > Q4 BMP - CBG monitoring > NPO > UA > hemoglobin A1c > replete potassium with caution  Acute Encephalopathy - metabolic vs toxic Patient initially complaining of a headache and was found to be very drowsy on exam, only responsive to painful stimuli. Suspect this is secondary to toxic encephalopathy. Currently on Lyrica which was noted to make patient groggy in the past. Additionally, she was given 1 mg Ativan on arrival and did not become drowsy until after this time. RR notably slow at 9/min. VBG checked without evidence of hypercarbia. Metabolic encephalopathy secondary to DKA may also be factor. Uremia appears near baseline. Head CT was negative making structural etiology less likely. A Pt afebrile with normal WBC making infectious cause less likely.  Upon re-evaluation of patient, she was noted to have improvement in her mental status despite persistently elevated CBG making toxic encephalopathy related to medication administration more likely > urinalysis > urine drug  screen  Left arm spasm Presented with severe left arm cramping and spasms. Records reveal nephrology previously placed her on Lyrica for spasms with some improvement. Myoclonic jerking new onset since starting Lyrica. Does not appear to be post-ictal > hold centrally acting medications  ESRD HTN Pt with history of ESRD. BUN elevated at 49. Pt reportedly compliant with her dialysis sessions and was last dialyzed on 09/08. Patient does still produce urine. Appears fluid overloaded and hypertensive having missed dialysis session today. Not requiring supplemental O2 at this time. > HD in AM per nephro - strict I+Os - avoid renally metabolized drugs - restart home BP meds  Hyperlipidemia  - continue statin  Dispo: Admit patient to Inpatient with expected length of stay greater than 2 midnights.  Signed: Essie Hart, MS3  Attestation for Student Documentation: I personally was present and performed or re-performed the history, physical exam and medical decision-making activities of this service and have verified that the service and findings are accurately documented in the student's note. Delene Ruffini, MD 02/08/2022, 9:37 PM

## 2022-02-08 NOTE — ED Notes (Signed)
Patient now complaining of headache.

## 2022-02-08 NOTE — Progress Notes (Signed)
20g X 1  catheter placed R/Lat FA. Unable to get labs from I.V. start as requested.

## 2022-02-08 NOTE — ED Notes (Signed)
Father Legrand Como [phone] would like an update asap

## 2022-02-09 DIAGNOSIS — E1165 Type 2 diabetes mellitus with hyperglycemia: Secondary | ICD-10-CM

## 2022-02-09 DIAGNOSIS — Z794 Long term (current) use of insulin: Secondary | ICD-10-CM

## 2022-02-09 DIAGNOSIS — E11319 Type 2 diabetes mellitus with unspecified diabetic retinopathy without macular edema: Secondary | ICD-10-CM

## 2022-02-09 DIAGNOSIS — E1021 Type 1 diabetes mellitus with diabetic nephropathy: Secondary | ICD-10-CM

## 2022-02-09 DIAGNOSIS — E876 Hypokalemia: Secondary | ICD-10-CM

## 2022-02-09 LAB — BASIC METABOLIC PANEL
Anion gap: 14 (ref 5–15)
Anion gap: 9 (ref 5–15)
BUN: 34 mg/dL — ABNORMAL HIGH (ref 6–20)
BUN: 18 mg/dL (ref 6–20)
CO2: 26 mmol/L (ref 22–32)
CO2: 29 mmol/L (ref 22–32)
Calcium: 8.8 mg/dL — ABNORMAL LOW (ref 8.9–10.3)
Calcium: 8 mg/dL — ABNORMAL LOW (ref 8.9–10.3)
Chloride: 91 mmol/L — ABNORMAL LOW (ref 98–111)
Chloride: 96 mmol/L — ABNORMAL LOW (ref 98–111)
Creatinine, Ser: 6.14 mg/dL — ABNORMAL HIGH (ref 0.44–1.00)
Creatinine, Ser: 3.94 mg/dL — ABNORMAL HIGH (ref 0.44–1.00)
GFR, Estimated: 9 mL/min — ABNORMAL LOW (ref >60)
GFR, Estimated: 15 mL/min — ABNORMAL LOW (ref >60)
Glucose, Bld: 225 mg/dL — ABNORMAL HIGH (ref 70–99)
Glucose, Bld: 71 mg/dL (ref 70–99)
Potassium: 3.1 mmol/L — ABNORMAL LOW (ref 3.5–5.1)
Potassium: 3.4 mmol/L — ABNORMAL LOW (ref 3.5–5.1)
Sodium: 131 mmol/L — ABNORMAL LOW (ref 135–145)
Sodium: 134 mmol/L — ABNORMAL LOW (ref 135–145)

## 2022-02-09 LAB — RAPID URINE DRUG SCREEN, HOSP PERFORMED
Amphetamines: NOT DETECTED
Barbiturates: NOT DETECTED
Benzodiazepines: NOT DETECTED
Cocaine: NOT DETECTED
Opiates: NOT DETECTED
Tetrahydrocannabinol: NOT DETECTED

## 2022-02-09 LAB — GLUCOSE, CAPILLARY
Glucose-Capillary: 102 mg/dL — ABNORMAL HIGH (ref 70–99)
Glucose-Capillary: 107 mg/dL — ABNORMAL HIGH (ref 70–99)
Glucose-Capillary: 134 mg/dL — ABNORMAL HIGH (ref 70–99)
Glucose-Capillary: 140 mg/dL — ABNORMAL HIGH (ref 70–99)
Glucose-Capillary: 145 mg/dL — ABNORMAL HIGH (ref 70–99)
Glucose-Capillary: 166 mg/dL — ABNORMAL HIGH (ref 70–99)
Glucose-Capillary: 230 mg/dL — ABNORMAL HIGH (ref 70–99)
Glucose-Capillary: 265 mg/dL — ABNORMAL HIGH (ref 70–99)
Glucose-Capillary: 299 mg/dL — ABNORMAL HIGH (ref 70–99)
Glucose-Capillary: 36 mg/dL — CL (ref 70–99)
Glucose-Capillary: 363 mg/dL — ABNORMAL HIGH (ref 70–99)
Glucose-Capillary: 37 mg/dL — CL (ref 70–99)
Glucose-Capillary: 47 mg/dL — ABNORMAL LOW (ref 70–99)
Glucose-Capillary: 48 mg/dL — ABNORMAL LOW (ref 70–99)
Glucose-Capillary: 49 mg/dL — ABNORMAL LOW (ref 70–99)
Glucose-Capillary: 61 mg/dL — ABNORMAL LOW (ref 70–99)
Glucose-Capillary: 62 mg/dL — ABNORMAL LOW (ref 70–99)
Glucose-Capillary: 62 mg/dL — ABNORMAL LOW (ref 70–99)
Glucose-Capillary: 67 mg/dL — ABNORMAL LOW (ref 70–99)
Glucose-Capillary: 67 mg/dL — ABNORMAL LOW (ref 70–99)
Glucose-Capillary: 74 mg/dL (ref 70–99)
Glucose-Capillary: 89 mg/dL (ref 70–99)
Glucose-Capillary: 99 mg/dL (ref 70–99)

## 2022-02-09 LAB — POTASSIUM: Potassium: 4.2 mmol/L (ref 3.5–5.1)

## 2022-02-09 LAB — URINALYSIS, ROUTINE W REFLEX MICROSCOPIC
Bilirubin Urine: NEGATIVE
Glucose, UA: >=500 mg/dL — AB
Ketones, ur: NEGATIVE mg/dL
Leukocytes,Ua: NEGATIVE
Nitrite: NEGATIVE
Protein, ur: >=300 mg/dL — AB
Specific Gravity, Urine: 1.018 (ref 1.005–1.030)
pH: 5 (ref 5.0–8.0)

## 2022-02-09 LAB — HEPATITIS C ANTIBODY: HCV Ab: NONREACTIVE

## 2022-02-09 LAB — HEPATITIS B SURFACE ANTIBODY,QUALITATIVE: Hep B S Ab: REACTIVE — AB

## 2022-02-09 LAB — MAGNESIUM: Magnesium: 1.6 mg/dL — ABNORMAL LOW (ref 1.7–2.4)

## 2022-02-09 LAB — HEPATITIS B CORE ANTIBODY, TOTAL: Hep B Core Total Ab: NONREACTIVE

## 2022-02-09 LAB — HEPATITIS B SURFACE ANTIGEN: Hepatitis B Surface Ag: NONREACTIVE

## 2022-02-09 MED ORDER — DEXTROSE IN LACTATED RINGERS 5 % IV SOLN: INTRAVENOUS | Status: DC

## 2022-02-09 MED ORDER — INSULIN GLARGINE-YFGN 100 UNIT/ML ~~LOC~~ SOLN
5.0000 [IU] | Freq: Every day | SUBCUTANEOUS | Status: DC
  Administered 2022-02-09: 5 [IU] via SUBCUTANEOUS
  Filled 2022-02-09 (×2): qty 0.05

## 2022-02-09 MED ORDER — WHITE PETROLATUM EX OINT: TOPICAL_OINTMENT | CUTANEOUS | Status: DC | PRN

## 2022-02-09 MED ORDER — DEXTROSE 5 % IV SOLN
INTRAVENOUS | Status: DC
  Filled 2022-02-09: qty 1000

## 2022-02-09 MED ORDER — CHLORHEXIDINE GLUCONATE CLOTH 2 % EX PADS
6.0000 | MEDICATED_PAD | Freq: Every day | CUTANEOUS | Status: DC
  Administered 2022-02-09 – 2022-02-10 (×2): 6 via TOPICAL

## 2022-02-09 MED ORDER — POTASSIUM CHLORIDE 10 MEQ/100ML IV SOLN
10.0000 meq | Freq: Once | INTRAVENOUS | Status: AC
  Administered 2022-02-09: 10 meq via INTRAVENOUS
  Filled 2022-02-09: qty 100

## 2022-02-09 NOTE — Progress Notes (Signed)
Hemodialysis- Patient brought for treatment. Last bg 149, insulin gtt currently off. Patient requesting to eat. K.Stovall PA at bedside- discussed with primary team also- primary team to come see patient shortly also. Checking bg prior to start of HD.

## 2022-02-09 NOTE — Progress Notes (Signed)
Provider made aware about low mag and potassium. No response.

## 2022-02-09 NOTE — Progress Notes (Addendum)
1342: Patient arrived from HD. BGM 67. Pt yelling that she is hungry and only wants OJ for her juice replacement. VSS. Pt AOx4.  Provider Elliot Gurney) paged to make aware about glucose level. Per MD, okay to give OJ and stop D5 IVF that was still running at 50cc/hr. Patient placed on diet.   RN will recheck BGM in 24mins.  Provider Elliot Gurney) notified about all of patients low BGM. Does not want to give dextrose at this time. Patient continues to be alert. Eating snacks and meals.   Q2 glucose check order in place until patient levels out. Per Night MD, page every time when patient is below 35.

## 2022-02-09 NOTE — Procedures (Signed)
I was present at this dialysis session. I have reviewed the session itself and made appropriate changes.   K of 3.1 on 4K bath. Hb stable 11.9.  Off insulin gtt.  UF goal 3L.  Using AVG.   Filed Weights   02/08/22 1033 02/09/22 0826  Weight: 74.8 kg 56.9 kg    Recent Labs  Lab 02/09/22 0127  NA 131*  K 3.1*  CL 91*  CO2 26  GLUCOSE 225*  BUN 34*  CREATININE 6.14*  CALCIUM 8.8*    Recent Labs  Lab 02/08/22 1231 02/08/22 1402 02/08/22 1705  WBC 4.4  --   --   NEUTROABS 2.8  --   --   HGB 11.9* 15.0 12.9  HCT 35.7* 44.0 38.0  MCV 88.4  --   --   PLT 162  --   --     Scheduled Meds:  amLODipine  10 mg Oral QHS   Chlorhexidine Gluconate Cloth  6 each Topical Daily   heparin  5,000 Units Subcutaneous Q8H   labetalol  100 mg Oral BID   levothyroxine  25 mcg Oral QAC breakfast   rosuvastatin  20 mg Oral QHS   Continuous Infusions:  dextrose 50 mL/hr at 02/09/22 0853   insulin Stopped (02/09/22 0750)   PRN Meds:.acetaminophen **OR** acetaminophen, dextrose   Pearson Grippe  MD 02/09/2022, 9:11 AM

## 2022-02-09 NOTE — Plan of Care (Signed)
  Problem: Nutritional: Goal: Maintenance of adequate nutrition will improve Outcome: Progressing Goal: Maintenance of adequate weight for body size and type will improve Outcome: Progressing   Problem: Respiratory: Goal: Will regain and/or maintain adequate ventilation Outcome: Progressing   Problem: Skin Integrity: Goal: Risk for impaired skin integrity will decrease Outcome: Progressing   Problem: Tissue Perfusion: Goal: Adequacy of tissue perfusion will improve Outcome: Progressing   Problem: Clinical Measurements: Goal: Ability to maintain clinical measurements within normal limits will improve Outcome: Progressing Goal: Will remain free from infection Outcome: Progressing Goal: Diagnostic test results will improve Outcome: Progressing Goal: Respiratory complications will improve Outcome: Progressing Goal: Cardiovascular complication will be avoided Outcome: Progressing   Problem: Safety: Goal: Ability to remain free from injury will improve Outcome: Progressing

## 2022-02-09 NOTE — Consult Note (Signed)
[facility] Renal Consultation Note    Indication for Consultation:  Management of ESRD/hemodialysis, anemia, hypertension/volume, and secondary hyperparathyroidism. PCP:  HPI: Ann Robertson is a 31 y.o. female with ESRD, T1DM with retinopathy/neuropathy, HTN, HL who was admitted with hyperglycemia and muscle spasms.  Presented to ED from her dialysis unit yesterday with concern for severe L arm muscle spasms/pain and inability to move her hand. In the ED, she was given Ativan for spasms which made her drowsy. Labs with Na 121, K 4.9, Glu > 700, Hgb 15. She was started on IV insulin per DKA protocol. CXR with mild vascular congestion. Head CT negative for acute abnormalities. Of note, she was started on Lyrica 50mg  1 week ago for severe neuropathy manifesting as itching. Helped significantly with the itching but caused drowsiness, so the dose was reduced to 25mg  nightly on Friday 9/8. Her DM has been very uncontrolled recently - A1c at her HD unit was 12.5% in 11/2021.  Seen in HD unit at onset of treatment today. Denies fever, chills, CP, dyspnea, N/V/D. Still cannot fully grip her L hand although spasms have resolved. Most recent BS this AM is 145, insulin drip was stopped and she has order pending for D5 drip.  Dialyzes on MWF schedule at [facility] clinic via LUE AVG. She does have LE edema.  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   Neuropathy    Preeclampsia    STD (sexually transmitted disease)    Past Surgical History:  Procedure Laterality Date   AV FISTULA PLACEMENT Left 06/19/2021   Procedure: INSERTION OF ARTERIOVENOUS (AV) GORE-TEX GRAFT ARM;  Surgeon: Angelia Mould, MD;  Location: [place];  Service: Vascular;  Laterality: Left;   [place] N/A 07/05/2013   Procedure: CESAREAN SECTION;  Surgeon: Emily Filbert, MD;  Location: [facility];  Service: Obstetrics;   Laterality: N/A;   CESAREAN SECTION N/A 07/25/2014   Procedure: CESAREAN SECTION;  Surgeon: Donnamae Jude, MD;  Location: [facility];  Service: Obstetrics;  Laterality: N/A;   CESAREAN SECTION N/A 11/21/2015   Procedure: CESAREAN SECTION;  Surgeon: Donnamae Jude, MD;  Location: [place];  Service: Obstetrics;  Laterality: N/A;   IR FLUORO GUIDE CV LINE RIGHT  06/16/2021   IR US GUIDE VASC ACCESS RIGHT  06/16/2021   WISDOM TOOTH EXTRACTION     Family History  Problem Relation Age of Onset   Stroke Mother    Hypertension Mother    Heart disease Mother    Kidney disease Mother    Hyperlipidemia Father    Diabetes Father    Social History:  reports that she has quit smoking. Her smoking use included cigarettes. She has a 2.25 pack-year smoking history. She has never used smokeless tobacco. She reports that she does not currently use alcohol. She reports that she does not use drugs.  ROS: As per HPI otherwise negative.  Physical Exam: Vitals:   02/08/22 2258 02/09/22 0036 02/09/22 0359 02/09/22 0730  BP:  (!) 161/95 (!) 153/96 119/73  Pulse: 81 78 84 72  Resp:  11 19 10   Temp:    99.5 F (37.5 C)  TempSrc:  Oral  Oral  SpO2:  98% 100% 97%  Weight:      Height:         General: Thin woman, NAD. Room air. Head: Normocephalic, atraumatic, sclera  non-icteric, mucus membranes are moist. Neck: Supple without lymphadenopathy/masses. JVD not elevated. Lungs: Clear bilaterally to auscultation without wheezes, rales, or rhonchi. Breathing is unlabored. Heart: RRR with normal S1, S2. No murmurs, rubs, or gallops appreciated. Abdomen: Soft, non-tender, non-distended with normoactive bowel sounds. Musculoskeletal:  Reduced grip strength in L hand Lower extremities: 2+ BLE edema; scattered scratches, no obvious open wounds. Neuro: Alert and oriented X 3. Moves all extremities spontaneously. Psych:  Responds to questions appropriately with a normal affect. Dialysis Access: LUE AVG +  bruit  Allergies  Allergen Reactions   Hydromorphone Hcl Itching and Rash   Prior to Admission medications   Medication Sig Start Date End Date Taking? Authorizing Provider  amLODipine (NORVASC) 10 MG tablet Take 10 mg by mouth at bedtime. 08/24/21   [provider]  bisacodyl (DULCOLAX) 10 MG suppository Place 1 suppository (10 mg total) rectally daily as needed for moderate constipation. Patient not taking: Reported on 06/17/2021 02/21/21   Terrilee Croak, MD  Continuous Blood Gluc Sensor (DEXCOM G6 SENSOR) MISC Apply topically. 03/12/21   [provider]  Continuous Blood Gluc Sensor (DEXCOM G6 SENSOR) MISC  03/12/21   [provider]  Continuous Blood Gluc Transmit (DEXCOM G6 TRANSMITTER) MISC  02/22/21   [provider]  fluconazole (DIFLUCAN) 100 MG tablet Take 100 mg by mouth daily. 12/14/21   [provider]  gabapentin (NEURONTIN) 600 MG tablet Take 600 mg by mouth 3 (three) times daily. 10/13/21   [provider]  insulin glargine (LANTUS SOLOSTAR) 100 UNIT/ML Solostar Pen Inject 2-5 Units into the skin daily as needed (high BGL). Inject 2-5 units as needed for High glucose levels Patient taking differently: Inject 4 Units into the skin daily before breakfast. Inject 2-5 units as needed for High glucose levels 05/01/21   Starla Link, Kshitiz, MD  insulin lispro (HUMALOG) 100 UNIT/ML KwikPen Inject 1-6 Units into the skin See admin instructions. Inject 1-6 units up to three times daily with meals (only if eating) per sliding scale based on CBG and Carb count    [provider]  Insulin Pen Needle (PEN NEEDLES) 30G X 8 MM MISC 1 each by Does not apply route daily. E11.9 05/14/19   Renato Shin, MD  KLOXXADO 8 MG/0.1ML LIQD Place into both nostrils. 10/14/21   [provider]  labetalol (NORMODYNE) 100 MG tablet Take 100 mg by mouth 2 (two) times daily. 03/26/21   [provider]  levothyroxine (SYNTHROID) 25 MCG tablet  Take 25 mcg by mouth daily before breakfast. 06/11/21   [provider]  lidocaine-prilocaine (EMLA) cream Apply topically. 11/17/21   [provider]  methocarbamol (ROBAXIN) 750 MG tablet Take 750 mg by mouth every 8 (eight) hours as needed for muscle spasms. 03/16/21   [provider]  multivitamin (RENA-VIT) TABS tablet Take 1 tablet by mouth at bedtime. 06/19/21   Masters, Katie, DO  Nutritional Supplements (FEEDING SUPPLEMENT, NEPRO CARB STEADY,) LIQD Take 237 mLs by mouth 2 (two) times daily between meals. 06/20/21   Masters, Katie, DO  omeprazole (PRILOSEC) 20 MG capsule Take 20 mg by mouth daily as needed (acid reflux/heartburn).    [provider]  ondansetron (ZOFRAN) 4 MG tablet Take 1 tablet (4 mg total) by mouth every 8 (eight) hours as needed for nausea or vomiting. 06/10/20   Renato Shin, MD  oxyCODONE-acetaminophen (PERCOCET) 10-325 MG tablet Take 1 tablet by mouth every 4 (four) hours as needed for pain. 02/13/21   [provider]  pregabalin (LYRICA) 25 MG capsule Take by mouth at bedtime. 02/05/22   [provider]  pregabalin (LYRICA) 50 MG capsule Take 50 mg by mouth at bedtime. 02/03/22   [provider]  rosuvastatin (CRESTOR) 20 MG tablet Take 20 mg by mouth at bedtime. 03/16/21   [provider]  torsemide (DEMADEX) 100 MG tablet 100 mg twice daily on non-dialysis days. 06/19/21   Masters, Katie, DO  Vitamin D, Ergocalciferol, (DRISDOL) 1.25 MG (50000 UNIT) CAPS capsule Take 50,000 Units by mouth every Tuesday.    [provider]   Current Facility-Administered Medications  Medication Dose Route Frequency Provider Last Rate Last Admin   acetaminophen (TYLENOL) tablet 650 mg  650 mg Oral Q6H PRN Iona Beard, MD       Or   acetaminophen (TYLENOL) suppository 650 mg  650 mg Rectal Q6H PRN Iona Beard, MD       amLODipine (NORVASC) tablet 10 mg  10 mg Oral QHS Delene Ruffini, MD   10 mg at  02/08/22 2256   dextrose 5 % solution   Intravenous Continuous Gaylan Gerold, DO       dextrose 50 % solution 0-50 mL  0-50 mL Intravenous PRN Suella Broad A, PA-C   50 mL at 02/09/22 0622   heparin injection 5,000 Units  5,000 Units Subcutaneous Q8H Delene Ruffini, MD   5,000 Units at 02/09/22 4315   insulin regular, human (MYXREDLIN) 100 units/ 100 mL infusion   Intravenous Continuous Tacy Learn, PA-C   Stopped at 02/09/22 0750   labetalol (NORMODYNE) tablet 100 mg  100 mg Oral BID Delene Ruffini, MD   100 mg at 02/08/22 2258   levothyroxine (SYNTHROID) tablet 25 mcg  25 mcg Oral QAC breakfast Delene Ruffini, MD   25 mcg at 02/09/22 4008   rosuvastatin (CRESTOR) tablet 20 mg  20 mg Oral QHS Delene Ruffini, MD   20 mg at 02/08/22 2257   Labs: Basic Metabolic Panel: Recent Labs  Lab 02/08/22 1402 02/08/22 1705 02/08/22 2141 02/09/22 0127  NA 121* 124* 129* 131*  K 4.9 3.2* 3.2* 3.1*  CL 86*  --  91* 91*  CO2  --   --  21* 26  GLUCOSE >700*  --  560* 225*  BUN 49*  --  35* 34*  CREATININE 6.30*  --  6.11* 6.14*  CALCIUM  --   --  8.3* 8.8*   Liver Function Tests: Recent Labs  Lab 02/08/22 2141  AST 24  ALT 19  ALKPHOS 198*  BILITOT 1.1  PROT 5.5*  ALBUMIN 2.5*   CBC: Recent Labs  Lab 02/08/22 1231 02/08/22 1402 02/08/22 1705  WBC 4.4  --   --   NEUTROABS 2.8  --   --   HGB 11.9* 15.0 12.9  HCT 35.7* 44.0 38.0  MCV 88.4  --   --   PLT 162  --   --    CBG: Recent Labs  Lab 02/09/22 0507 02/09/22 0542 02/09/22 0610 02/09/22 0629 02/09/22 0750  GLUCAP 49* 47* 48* 363* 145*   Studies/Results: DG CHEST PORT 1 VIEW  Result Date: 02/08/2022 CLINICAL DATA:  Dialysis patient, hyperglycemia EXAM: PORTABLE CHEST 1 VIEW COMPARISON:  06/16/2021 FINDINGS: Heart is enlarged with vascular congestion centrally and bibasilar atelectasis. Small right effusion. Trachea midline. No pneumothorax. IMPRESSION: Cardiomegaly with vascular congestion Basilar  atelectasis and small right effusion. Electronically Signed   By: Jerilynn Mages.  Shick M.D.   On: 02/08/2022 15:57   CT Head  Wo Contrast  Result Date: 02/08/2022 CLINICAL DATA:  A 30 year old female presents for evaluation of headache, sudden and severe headache. EXAM: CT HEAD WITHOUT CONTRAST TECHNIQUE: Contiguous axial images were obtained from the base of the skull through the vertex without intravenous contrast. RADIATION DOSE REDUCTION: This exam was performed according to the departmental dose-optimization program which includes automated exposure control, adjustment of the mA and/or kV according to patient size and/or use of iterative reconstruction technique. COMPARISON:  November of 2022. FINDINGS: Brain: No evidence of acute infarction, hemorrhage, hydrocephalus, extra-axial collection or mass lesion/mass effect. Vascular: No hyperdense vessel or unexpected calcification. Skull: Normal. Negative for fracture or focal lesion. Sinuses/Orbits: Visualized paranasal sinuses and orbits are unremarkable to the extent evaluated. Other: None. IMPRESSION: No acute intracranial abnormality. Electronically Signed   By: Zetta Bills M.D.   On: 02/08/2022 13:26    Dialysis Orders:  MWF at [facility], 400/500, EDW 50.8kg, 2K/2Ca, LUE AVG, heparin 2500 unit bolus - Mircera 31mcg IV q 4 weeks - Hectoral 65mcg IV q HD - HBsAg negative on 01/13/2022  Assessment/Plan:  Severe hyperglycemia: Not full blown DKA, was on IV insulin overnight - stopped this AM, starting D5. Per primary.  Hypokalemia: This AM d/t IV insulin, will use 4K bath with HD.  ESRD:  Usual MWF schedule - missed HD yesterday d/t being sent to ED. For HD today, then back to usual schedule tomorrow.  Hypertension/volume: BP decent, 2+ LE edema -> 2.5-3L UFG as tolerated.  Anemia: Hgb > 11, no ESA for now.  Metabolic bone disease: Ca ok, will check Phos. Continue home binders once eating.  Nutrition:  Alb very low, adding supplements once  eating. T1DM with retinopathy (s/p recent laser treatment) + severe neuropathy: Recent started on Lyrica, unclear if contributing to presenting symptoms -> hold and allow to clear with dialysis.  Veneta Penton, PA-C 02/09/2022, 8:16 AM  [employer]

## 2022-02-09 NOTE — Progress Notes (Signed)
Lab is to return to do [place] and BMP.  Information has been passed on to day shift nurse to follow up.   HD called, report given.

## 2022-02-09 NOTE — Progress Notes (Signed)
Hemodialysis- System clotted with 40 minutes remaining. When attempting to restring patient states she does not want to restart treatment. Discussed risks of cutting treatments short. Patient declines restart.

## 2022-02-09 NOTE — Progress Notes (Signed)
Hemodialysis- BG 99- D5% started as ordered in dialysis.

## 2022-02-09 NOTE — Progress Notes (Signed)
Subjective:  Overnight events: Hypoglycemic to the 40s required D50 administration   Patient states she was able to sleep okay. Complains of poor left hand grip strength. Does not complain of continued left arm spasms or pain. States she would like to eat.  Objective:  Vital signs in last 24 hours: Vitals:   02/09/22 0900 02/09/22 0910 02/09/22 1000 02/09/22 1033  BP: 115/78 116/75 105/70 113/74  Pulse: 76 77 78 75  Resp: 10 17 10 11   Temp:      TempSrc:      SpO2: 97% 100% 100% 100%  Weight:      Height:       Weight change:   Intake/Output Summary (Last 24 hours) at 02/09/2022 1049 Last data filed at 02/09/2022 0120 Gross per 24 hour  Intake 1000 ml  Output 200 ml  Net 800 ml   GENERAL:             No acute distress. Drowsy appearing.  HENT:     Head: Normocephalic and atraumatic.   Cardiovascular:     Rate and Rhythm: Normal rate and regular rhythm.     Heart sounds: High flow murmur    Pulse: Palpable thrill over left upper extremity fistula Pulmonary:     Effort: Pulmonary effort is normal.     Breath sounds: Normal breath sounds.   Skin:    General:  Skin is warm and dry.  Neurological:     General: No focal deficit present. Moves all extremities.     Mental Status: Sleepy. Slow to answer question but responds appropriately.     Strength: 5/5 strength in bilateral shoulders. 4+ strength in bilateral upper extremities with arm flexion, extension, and finger grasp. Notable lassitude.      Sensation: Normal in bilateral upper extremities.     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 Suspect non-adherence to insulin regimen. Differential remains DKA vs HHS though favor HHS.Hemoglobin. A1c 14.3.  Started Endotool yesterday. Pt became hypoglycemic to the 40s overnight. Given D50 with improvement. Endotool has been stopped. Mentation has improved. She was given 5units long acting, and diet ordered. CBG noted to drop again. Patient drank 2 cups of juice. Noted to be asymptomatic, was awake, responsive, and eating eating lunch despite low CBG.  She appears to be rather sensitive to insulin. CBG is improving with PO intake.  - Start long acting insulin - Begin carb modified diet  - CBG monitoring - Daily BMP - Monitor electrolytes and replace as necessary  Acute Encephalopathy - metabolic vs toxic Still suspect toxic encephalopathy secondary to Lyrica and ativan as cause of patients initial headache and drowsiness. RR has increased slightly from arrival. She was much more alert today, although still drowsy appearing. Unclear why she is still sleepy. Still cannot rule out metabolic cause from HHS or DKA as potential source of encephalopathy. Remains afebrile. Infectious cause still unlikely. Urine toxicology screen negative.  Appears to have improved after  HD.   Left arm spasm Presented with severe left arm cramping and spasms. Placed on Lyrica which did result in myoclonic jerking. No longer complaining of spasms but states she is having trouble trouble gripping the left hand. Unclear if this is related to the Lyrica. Neuro exam did demonstrate slight decrease in strength of bilateral upper extremities. Believe this may be secondary to exhaustion from the days events. Will re-evaluate in the morning.  - continue to hold centrally acting medications - re-evaluate strength  tomorrow  ESRD HTN Hx of ESRD. States she is compliant with her dialysis sessions. Does not require supplemental oxygen. Hypertension has improved after resuming home meds. Received hemodialysis today.  - strict I+Os - avoid renally metabolized drugs  - continue  home BP meds - daily phosphate - HD per nephrology  Hyperlipidemia - continue statin     LOS: 1 day   Hennie Duos I, Medical Student 02/09/2022, 10:49 AM   Attestation for Student Documentation: I personally was present and performed or re-performed the history, physical exam and medical decision-making activities of this service and have verified that the service and findings are accurately documented in the student's note. Delene Ruffini, MD 02/09/2022, 3:46 PM

## 2022-02-10 DIAGNOSIS — E1065 Type 1 diabetes mellitus with hyperglycemia: Secondary | ICD-10-CM | POA: Diagnosis not present

## 2022-02-10 DIAGNOSIS — E876 Hypokalemia: Secondary | ICD-10-CM

## 2022-02-10 DIAGNOSIS — G9340 Encephalopathy, unspecified: Secondary | ICD-10-CM

## 2022-02-10 DIAGNOSIS — E1021 Type 1 diabetes mellitus with diabetic nephropathy: Secondary | ICD-10-CM | POA: Diagnosis not present

## 2022-02-10 DIAGNOSIS — Z794 Long term (current) use of insulin: Secondary | ICD-10-CM

## 2022-02-10 DIAGNOSIS — E10319 Type 1 diabetes mellitus with unspecified diabetic retinopathy without macular edema: Secondary | ICD-10-CM

## 2022-02-10 LAB — GLUCOSE, CAPILLARY
Glucose-Capillary: 290 mg/dL — ABNORMAL HIGH (ref 70–99)
Glucose-Capillary: 360 mg/dL — ABNORMAL HIGH (ref 70–99)
Glucose-Capillary: 428 mg/dL — ABNORMAL HIGH (ref 70–99)
Glucose-Capillary: 388 mg/dL — ABNORMAL HIGH (ref 70–99)
Glucose-Capillary: 235 mg/dL — ABNORMAL HIGH (ref 70–99)
Glucose-Capillary: 126 mg/dL — ABNORMAL HIGH (ref 70–99)
Glucose-Capillary: 83 mg/dL (ref 70–99)

## 2022-02-10 LAB — HEPATITIS B SURFACE ANTIBODY, QUANTITATIVE: Hep B S AB Quant (Post): 13.2 m[IU]/mL (ref >9.9)

## 2022-02-10 LAB — RENAL FUNCTION PANEL
Albumin: 2.6 g/dL — ABNORMAL LOW (ref 3.5–5.0)
Anion gap: 12 (ref 5–15)
BUN: 24 mg/dL — ABNORMAL HIGH (ref 6–20)
CO2: 24 mmol/L (ref 22–32)
Calcium: 7.9 mg/dL — ABNORMAL LOW (ref 8.9–10.3)
Chloride: 93 mmol/L — ABNORMAL LOW (ref 98–111)
Creatinine, Ser: 4.63 mg/dL — ABNORMAL HIGH (ref 0.44–1.00)
GFR, Estimated: 12 mL/min — ABNORMAL LOW (ref >60)
Glucose, Bld: 387 mg/dL — ABNORMAL HIGH (ref 70–99)
Phosphorus: 2.5 mg/dL (ref 2.5–4.6)
Potassium: 5.4 mmol/L — ABNORMAL HIGH (ref 3.5–5.1)
Sodium: 129 mmol/L — ABNORMAL LOW (ref 135–145)

## 2022-02-10 LAB — HEPATIC FUNCTION PANEL
ALT: 23 U/L (ref 0–44)
AST: 47 U/L — ABNORMAL HIGH (ref 15–41)
Albumin: 2.5 g/dL — ABNORMAL LOW (ref 3.5–5.0)
Alkaline Phosphatase: 182 U/L — ABNORMAL HIGH (ref 38–126)
Bilirubin, Direct: 0.5 mg/dL — ABNORMAL HIGH (ref 0.0–0.2)
Indirect Bilirubin: 0.9 mg/dL (ref 0.3–0.9)
Total Bilirubin: 1.4 mg/dL — ABNORMAL HIGH (ref 0.3–1.2)
Total Protein: 5.6 g/dL — ABNORMAL LOW (ref 6.5–8.1)

## 2022-02-10 MED ORDER — PENTAFLUOROPROP-TETRAFLUOROETH EX AERO: 1.0000 | INHALATION_SPRAY | CUTANEOUS | Status: DC | PRN

## 2022-02-10 MED ORDER — INSULIN ASPART 100 UNIT/ML IJ SOLN
0.0000 [IU] | Freq: Three times a day (TID) | INTRAMUSCULAR | Status: DC
  Administered 2022-02-10: 6 [IU] via SUBCUTANEOUS

## 2022-02-10 MED ORDER — INSULIN ASPART 100 UNIT/ML IJ SOLN: 0.0000 [IU] | Freq: Three times a day (TID) | INTRAMUSCULAR | Status: DC

## 2022-02-10 MED ORDER — LIDOCAINE HCL (PF) 1 % IJ SOLN: 5.0000 mL | INTRAMUSCULAR | Status: DC | PRN

## 2022-02-10 MED ORDER — HEPARIN SODIUM (PORCINE) 1000 UNIT/ML DIALYSIS: 1000.0000 [IU] | INTRAMUSCULAR | Status: DC | PRN

## 2022-02-10 MED ORDER — OXYCODONE HCL 5 MG PO TABS
5.0000 mg | ORAL_TABLET | Freq: Once | ORAL | Status: AC
  Administered 2022-02-10: 5 mg via ORAL
  Filled 2022-02-10: qty 1

## 2022-02-10 MED ORDER — DOXERCALCIFEROL 4 MCG/2ML IV SOLN
1.0000 ug | INTRAVENOUS | Status: DC
  Administered 2022-02-10: 1 ug via INTRAVENOUS
  Filled 2022-02-10: qty 2

## 2022-02-10 MED ORDER — INSULIN ASPART 100 UNIT/ML IJ SOLN: 0.0000 [IU] | Freq: Every day | INTRAMUSCULAR | Status: DC

## 2022-02-10 MED ORDER — HEPARIN SODIUM (PORCINE) 1000 UNIT/ML DIALYSIS
20.0000 [IU]/kg | INTRAMUSCULAR | Status: DC | PRN
  Filled 2022-02-10: qty 2

## 2022-02-10 MED ORDER — ENSURE ENLIVE PO LIQD: 237.0000 mL | Freq: Two times a day (BID) | ORAL | Status: DC

## 2022-02-10 MED ORDER — LIDOCAINE-PRILOCAINE 2.5-2.5 % EX CREA: 1.0000 | TOPICAL_CREAM | CUTANEOUS | Status: DC | PRN

## 2022-02-10 MED ORDER — BENZOCAINE 10 % MT GEL
Freq: Two times a day (BID) | OROMUCOSAL | Status: DC | PRN
  Administered 2022-02-10: 1 via OROMUCOSAL
  Filled 2022-02-10: qty 9

## 2022-02-10 MED ORDER — OXYCODONE-ACETAMINOPHEN 7.5-325 MG PO TABS
1.0000 | ORAL_TABLET | Freq: Once | ORAL | Status: AC
  Administered 2022-02-10: 1 via ORAL
  Filled 2022-02-10: qty 1

## 2022-02-10 MED ORDER — LOPERAMIDE HCL 2 MG PO CAPS
2.0000 mg | ORAL_CAPSULE | ORAL | Status: DC | PRN
  Filled 2022-02-10: qty 1

## 2022-02-10 MED ORDER — HEPARIN SODIUM (PORCINE) 1000 UNIT/ML IJ SOLN
INTRAMUSCULAR | Status: AC
  Filled 2022-02-10: qty 2

## 2022-02-10 MED ORDER — INSULIN GLARGINE-YFGN 100 UNIT/ML ~~LOC~~ SOLN
5.0000 [IU] | Freq: Every day | SUBCUTANEOUS | Status: DC
  Administered 2022-02-10 – 2022-02-11 (×2): 5 [IU] via SUBCUTANEOUS
  Filled 2022-02-10 (×2): qty 0.05

## 2022-02-10 NOTE — Discharge Summary (Addendum)
Name: Ann Robertson MRN: [id number] DOB: 1990/10/23 31 y.o. PCP: [facility] [place]  Date of Admission: 02/08/2022 10:27 AM Date of Discharge: 02/11/22 Attending Physician: Dr. Evette Doffing  Discharge Diagnosis: Principal Problem:   Hyperglycemia due to diabetes mellitus ([place]) Active Problems:   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[place])   Acute metabolic encephalopathy   ESRD (end stage renal disease) on dialysis [facility])    Discharge Medications: Allergies as of 02/10/2022       Reactions   Hydromorphone Hcl Itching, Rash        Medication List     STOP taking these medications    pregabalin 25 MG capsule Commonly known as: LYRICA       TAKE these medications    fluconazole 100 MG tablet Commonly known as: DIFLUCAN Take 100 mg by mouth daily.   insulin lispro 100 UNIT/ML KwikPen Commonly known as: HUMALOG Inject 1-6 Units into the skin See admin instructions. Inject 1-6 units up to three times daily with meals (only if eating) per sliding scale based on CBG and Carb count   labetalol 100 MG tablet Commonly known as: NORMODYNE Take 100 mg by mouth 2 (two) times daily.   Lantus SoloStar 100 UNIT/ML Solostar Pen Generic drug: insulin glargine Inject 2-5 Units into the skin daily as needed (high BGL). Inject 2-5 units as needed for High glucose levels What changed:  when to take this additional instructions   lidocaine-prilocaine cream Commonly known as: EMLA Apply 1 Application topically as needed (For port access).   omeprazole 20 MG capsule Commonly known as: PRILOSEC Take 20 mg by mouth daily as needed (acid reflux/heartburn).   ondansetron 4 MG tablet Commonly known as: ZOFRAN Take 1 tablet (4 mg total) by mouth every 8 (eight) hours as needed for nausea or vomiting.   oxyCODONE-acetaminophen 10-325 MG tablet Commonly known as: PERCOCET Take 1 tablet by mouth every 4 (four) hours as needed for pain.   rosuvastatin 20 MG  tablet Commonly known as: CRESTOR Take 20 mg by mouth at bedtime.        Disposition and follow-up:   Ann Robertson was discharged from [facility] in Stable condition.  At the hospital follow up visit please address:  1.  Follow-up:  a. Hyperglycemia - Poor adherence as outpatient. A1c 12.9. Patient very sensitive to insulin. Long acting decreased to 3units. Recommend outpatient follow up with possible endocrine referral.     b. Encephalopathy - likely toxic. Improved with holding of centrally acting meds. Strongly encourage weaning off opioids. Would avoid other centrally acting, renally cleared medicines in the future.    c. ESRD - follow up with outpatient nephrologist.   d.  2.  Labs / imaging needed at time of follow-up: BMP, CBC, CBG  3.  Pending labs/ test needing follow-up: none  4.  Medication Changes  Stopped: Pregabalin  Follow-up Appointments: PCP  Hospital Course by problem list:  31 year old female with a PMHx of ESRD (on dialysis, Monday/Wednesday/Friday) type 1 DM, and hyperlipidemia presents for left arm spasms and admitted for hyperglycemia.   # DKA vs HHS # DM1 with complication including nephropathy and retinopathy # Hypokalemia # Pseudohyponatremia Patient with history of poorly controlled T1DM presented on 09/11 found to have CBG >700 and A1c of 14.3. Started on Endotool however became hypoglycemic and this was stopped. Patient was started on subQ insulin 5 units with additional hypoglycemia. As her mentation improved, her PO intake improved as well  with noted improvement in her CBGs. Given her sensitivity to insulin, it was felt safest to not make dramatic changes to regimen. She was discharged on her home regimen to facilitate patient compliance.  Acute Encephalopathy - metabolic vs toxic On admission, pt complained of headache and was very drowsy on exam, only responsive to painful stimuli. Head CT was negative ruling out  structural cause. Patient was afebrile with normal WBC making infectious cause less likely.  Noted to be on Lyrica which has made her groggy in the past. Also given a dose of Ativan in the ED. Suspected symptoms secondary to toxic encephalopathy. Had improved mentation on 09/12 following HD session. Has been at her baseline since that time.   Left arm spasm Presented with left arm cramping, spasms, and intermittent myoclonic jerking.  Previously placed on Lyrica for spasms. Believed this to be cause of her jerking. Held centrally acting mediations. Pain and spasms resolved on 09/12, however pt noted decreased grip strength of the left upper extremity. Neurological exam unremarkable with exception of mild lassitude of bilateral upper extremities. Largely resolved by day of discharge.   ESRD HTN Pt with history of ESRD dialyzed M/W/F. BUN elevated on arrival. Initially appeared volume overloaded and hypertensive having missed her dialysis session. Home BP meds resumed. Was dialyzed on 09/12 with noted improvement.   Hyperlipidemia  Reported history of HLD. Placed on Crestor throughout her course.    Discharge Subjective: Patient feeling better today. Has not had any further arm twitching during admission. Willing to eat breakfast. Requesting dose of oxycodone. Wants to leave later after dialysis.  Discharge Exam:   BP 126/84   Pulse 89   Temp 97.7 F (36.5 C)   Resp 15   Ht 5\' 5"  (1.651 m)   Wt 53.4 kg   SpO2 98%   BMI 19.59 kg/m  GENERAL:             No acute distress. Resting comfortably Cardiovascular:     Rate and Rhythm: Normal rate and regular rhythm.     Heart sounds: High flow murmur Pulmonary:     Effort: Pulmonary effort is normal.     Breath sounds: Normal breath sounds.   Skin:    General:  Skin is warm and dry.  Neurological:     General: No focal deficit present. Moves all extremities.     Mental Status: cranial nerves grossly intact. No focal deficits. Moves all  extremities. Coordination appears improved  Pertinent Labs, Studies, and Procedures:     Latest Ref Rng & Units 02/08/2022    5:05 PM 02/08/2022    2:02 PM 02/08/2022   12:31 PM  CBC  WBC 4.0 - 10.5 K/uL   4.4   Hemoglobin 12.0 - 15.0 g/dL 12.9  15.0  11.9   Hematocrit 36.0 - 46.0 % 38.0  44.0  35.7   Platelets 150 - 400 K/uL   162        Latest Ref Rng & Units 02/10/2022    7:00 AM 02/09/2022    8:58 PM 02/09/2022    4:28 PM  CMP  Glucose 70 - 99 mg/dL 387   71   BUN 6 - 20 mg/dL 24   18   Creatinine 0.44 - 1.00 mg/dL 4.63   3.94   Sodium 135 - 145 mmol/L 129   134   Potassium 3.5 - 5.1 mmol/L 5.4  4.2  3.4   Chloride 98 - 111 mmol/L 93   96  CO2 22 - 32 mmol/L 24   29   Calcium 8.9 - 10.3 mg/dL 7.9   8.0   Total Protein 6.5 - 8.1 g/dL 5.6     Total Bilirubin 0.3 - 1.2 mg/dL 1.4     Alkaline Phos 38 - 126 U/L 182     AST 15 - 41 U/L 47     ALT 0 - 44 U/L 23       DG CHEST PORT 1 VIEW  Result Date: 02/08/2022 CLINICAL DATA:  Dialysis patient, hyperglycemia EXAM: PORTABLE CHEST 1 VIEW COMPARISON:  06/16/2021 FINDINGS: Heart is enlarged with vascular congestion centrally and bibasilar atelectasis. Small right effusion. Trachea midline. No pneumothorax. IMPRESSION: Cardiomegaly with vascular congestion Basilar atelectasis and small right effusion. Electronically Signed   By: Jerilynn Mages.  Shick M.D.   On: 02/08/2022 15:57   CT Head Wo Contrast  Result Date: 02/08/2022 CLINICAL DATA:  A 31 year old female presents for evaluation of headache, sudden and severe headache. EXAM: CT HEAD WITHOUT CONTRAST TECHNIQUE: Contiguous axial images were obtained from the base of the skull through the vertex without intravenous contrast. RADIATION DOSE REDUCTION: This exam was performed according to the departmental dose-optimization program which includes automated exposure control, adjustment of the mA and/or kV according to patient size and/or use of iterative reconstruction technique. COMPARISON:   November of 2022. FINDINGS: Brain: No evidence of acute infarction, hemorrhage, hydrocephalus, extra-axial collection or mass lesion/mass effect. Vascular: No hyperdense vessel or unexpected calcification. Skull: Normal. Negative for fracture or focal lesion. Sinuses/Orbits: Visualized paranasal sinuses and orbits are unremarkable to the extent evaluated. Other: None. IMPRESSION: No acute intracranial abnormality. Electronically Signed   By: Zetta Bills M.D.   On: 02/08/2022 13:26     Discharge Instructions: Discharge Instructions     Call MD for:  difficulty breathing, headache or visual disturbances   Complete by: As directed    Call MD for:  extreme fatigue   Complete by: As directed    Call MD for:  hives   Complete by: As directed    Call MD for:  persistant dizziness or light-headedness   Complete by: As directed    Call MD for:  persistant nausea and vomiting   Complete by: As directed    Call MD for:  redness, tenderness, or signs of infection (pain, swelling, redness, odor or green/yellow discharge around incision site)   Complete by: As directed    Call MD for:  severe uncontrolled pain   Complete by: As directed    Call MD for:  temperature >100.4   Complete by: As directed    Diet Carb Modified   Complete by: As directed    Increase activity slowly   Complete by: As directed        Signed: Delene Ruffini, MD 02/10/2022, 6:40 PM   Pager: [phone]

## 2022-02-10 NOTE — Progress Notes (Signed)
Pt receives out-pt HD at [facility] on MWF. Pt has a 9:50  chair time. Will assist as needed.  Melven Sartorius Renal Navigator [phone]

## 2022-02-10 NOTE — Progress Notes (Signed)
Received patient in bed to unit.  Alert and oriented.  Informed consent signed and in chart.   Treatment initiated: 1307 Treatment completed: 1727  Patient tolerated well.  Transported back to the room  Alert, without acute distress.  Hand-off given to patient's nurse.   Access used: av fistula Access issues: none  Total UF removed: 3.5L Medication(s) given: Post HD VS: 126/84,89,15,98% Post HD [facility]

## 2022-02-10 NOTE — Progress Notes (Addendum)
Refused lab draw this morning. Notified on call for IMTS latest glucose reading 290- no new orders- continue to monitor

## 2022-02-10 NOTE — Progress Notes (Signed)
[facility] Progress Note   Subjective:   Seen in room - says slept poorly overnight, having pain in L arm and still with reduced grip strength. Denies CP or dyspnea. For HD today - will be this afternoon. She is hoping to be discharged today, unclear if possible.  Objective Vitals:   02/09/22 1930 02/09/22 2030 02/10/22 0459 02/10/22 0500  BP: 113/75 120/85 (!) 134/93   Pulse: 82 86 95   Resp: 16 (!) 8 15   Temp: 98 F (36.7 C)  98.2 F (36.8 C)   TempSrc: Axillary  Oral   SpO2: 99% 100% 100%   Weight:    55.4 kg  Height:       Physical Exam General: Chronically ill appearing woman, NAD. Room air. Heart: RRR; no murmur Lungs: CTAB; no rales Abdomen: soft Extremities: 1+ BLE edema. Slight edema to L hand, reduced grip strength. Poor B radial pulses. Dialysis Access:  LUE AVG + bruit  Additional Objective Labs: Basic Metabolic Panel: Recent Labs  Lab 02/09/22 0127 02/09/22 1628 02/09/22 2058 02/10/22 0700  NA 131* 134*  --  129*  K 3.1* 3.4* 4.2 5.4*  CL 91* 96*  --  93*  CO2 26 29  --  24  GLUCOSE 225* 71  --  387*  BUN 34* 18  --  24*  CREATININE 6.14* 3.94*  --  4.63*  CALCIUM 8.8* 8.0*  --  7.9*  PHOS  --   --   --  2.5   Liver Function Tests: Recent Labs  Lab 02/08/22 2141 02/10/22 0700  AST 24 47*  ALT 19 23  ALKPHOS 198* 182*  BILITOT 1.1 1.4*  PROT 5.5* 5.6*  ALBUMIN 2.5* 2.5*  2.6*    CBC: Recent Labs  Lab 02/08/22 1231 02/08/22 1402 02/08/22 1705  WBC 4.4  --   --   NEUTROABS 2.8  --   --   HGB 11.9* 15.0 12.9  HCT 35.7* 44.0 38.0  MCV 88.4  --   --   PLT 162  --   --    CBG: Recent Labs  Lab 02/09/22 2205 02/09/22 2350 02/10/22 0258 02/10/22 0604 02/10/22 0817  GLUCAP 107* 166* 290* 360* 428*   Studies/Results: DG CHEST PORT 1 VIEW  Result Date: 02/08/2022 CLINICAL DATA:  Dialysis patient, hyperglycemia EXAM: PORTABLE CHEST 1 VIEW COMPARISON:  06/16/2021 FINDINGS: Heart is enlarged with vascular  congestion centrally and bibasilar atelectasis. Small right effusion. Trachea midline. No pneumothorax. IMPRESSION: Cardiomegaly with vascular congestion Basilar atelectasis and small right effusion. Electronically Signed   By: Jerilynn Mages.  Shick M.D.   On: 02/08/2022 15:57   CT Head Wo Contrast  Result Date: 02/08/2022 CLINICAL DATA:  A 31 year old female presents for evaluation of headache, sudden and severe headache. EXAM: CT HEAD WITHOUT CONTRAST TECHNIQUE: Contiguous axial images were obtained from the base of the skull through the vertex without intravenous contrast. RADIATION DOSE REDUCTION: This exam was performed according to the departmental dose-optimization program which includes automated exposure control, adjustment of the mA and/or kV according to patient size and/or use of iterative reconstruction technique. COMPARISON:  November of 2022. FINDINGS: Brain: No evidence of acute infarction, hemorrhage, hydrocephalus, extra-axial collection or mass lesion/mass effect. Vascular: No hyperdense vessel or unexpected calcification. Skull: Normal. Negative for fracture or focal lesion. Sinuses/Orbits: Visualized paranasal sinuses and orbits are unremarkable to the extent evaluated. Other: None. IMPRESSION: No acute intracranial abnormality. Electronically Signed   By: Zetta Bills M.D.   On:  02/08/2022 13:26    Medications:   amLODipine  10 mg Oral QHS   Chlorhexidine Gluconate Cloth  6 each Topical Daily   feeding supplement  237 mL Oral BID BM   heparin  5,000 Units Subcutaneous Q8H   insulin aspart  0-5 Units Subcutaneous QHS   insulin aspart  0-6 Units Subcutaneous TID WC   insulin glargine-yfgn  5 Units Subcutaneous Daily   labetalol  100 mg Oral BID   levothyroxine  25 mcg Oral QAC breakfast   rosuvastatin  20 mg Oral QHS    Dialysis Orders: MWF at [facility], 400/500, EDW 50.8kg, 2K/2Ca, LUE AVG, heparin 2500 unit bolus - Mircera 52mcg IV q 4 weeks - Hectoral 73mcg IV q HD -  HBsAg negative on 01/13/2022   Assessment/Plan:  Severe hyperglycemia: Not full blown DKA, was on IV insulin 9/11 - off now. Variable BS, primary adjusting her insulin.   Hypokalemia: On 9/12 with IV insulin, now K is 5.4 - using low K bath with HD today.   ESRD:  Usual MWF schedule, s/p HD yesterday d/t missed HD - HD again today to get back on schedule.  Hypertension/volume: BP decent, 1+ LE edema -> UF as tolerated with HD today, trying 3L.  Anemia: Hgb > 11, no ESA for now.  Metabolic bone disease: CorrCa ok, Phos low side. Will restart VDRA.  Nutrition:  Alb very low, continue supplements. T1DM with retinopathy (s/p recent laser treatment) + severe neuropathy: Recent started on Lyrica, unclear if contributing to presenting symptoms -> on hold. Reduced strength L hand: CT head negative for stroke. Possible neuropathy effect. L hand slightly swollen, on side of AVG, minimal B radial pulses -- would recommend fistulogram  to assure no steal syndrome effect. This can be arranged as outpatient if she is otherwise ready for discharge.  Veneta Penton, PA-C 02/10/2022, 8:58 AM  [employer]

## 2022-02-10 NOTE — Plan of Care (Signed)
  Problem: Education: Goal: Knowledge of General Education information will improve Description Including pain rating scale, medication(s)/side effects and non-pharmacologic comfort measures Outcome: Progressing   Problem: Clinical Measurements: Goal: Ability to maintain clinical measurements within normal limits will improve Outcome: Progressing   Problem: Activity: Goal: Risk for activity intolerance will decrease Outcome: Progressing   Problem: Nutrition: Goal: Adequate nutrition will be maintained Outcome: Progressing   Problem: Coping: Goal: Level of anxiety will decrease Outcome: Progressing   Problem: Pain Managment: Goal: General experience of comfort will improve Outcome: Progressing   Problem: Safety: Goal: Ability to remain free from injury will improve Outcome: Progressing   

## 2022-02-10 NOTE — Progress Notes (Addendum)
   Subjective:  Overnight events: NAEO   Patient feeling better today. Has not had any further arm twitching during admission. Willing to eat breakfast. Requesting dose of oxycodone. Wants to leave later after dialysis.   Objective:  Vital signs in last 24 hours: Vitals:   02/10/22 1500 02/10/22 1530 02/10/22 1600 02/10/22 1630  BP: 127/78 (!) 133/58  129/76  Pulse: 90 86 88 88  Resp: 19 10 14 20   Temp:      TempSrc:      SpO2: 100% 100% 100% 100%  Weight:      Height:       Weight change: -17.9 kg  Intake/Output Summary (Last 24 hours) at 02/10/2022 1700 Last data filed at 02/10/2022 1100 Gross per 24 hour  Intake 120 ml  Output --  Net 120 ml   GENERAL:             No acute distress. Resting comfortably Cardiovascular:     Rate and Rhythm: Normal rate and regular rhythm.     Heart sounds: High flow murmur Pulmonary:     Effort: Pulmonary effort is normal.     Breath sounds: Normal breath sounds.   Skin:    General:  Skin is warm and dry.  Neurological:     General: No focal deficit present. Moves all extremities.     Mental Status: cranial nerves grossly intact. No focal deficits. Moves all extremities. Coordination appears improved  Assessment/Plan:  Principal Problem:   Hyperglycemia due to diabetes mellitus ([facility]) Active Problems:   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)   Acute metabolic encephalopathy   ESRD (end stage renal disease) on dialysis [facility])  31 year old female with a PMHx of ESRD (on dialysis, Monday/Wednesday/Friday, last full dialysis on Friday), type 1 DM, and hyperlipidemia presents for left arm spasms and admitted for hyperglycemia.   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 Suspect non-adherence to insulin regimen.  She is on long acting insulin with sliding scale. Initially held sliding scale as patient  responded dramatically to insulin yesterday. She was hyperglycemic overnight and has since been started on SSI with meals. Still sensitive to small doses of insulin, but has shown more resilience today, likely from better PO intake from improved mental status.  - continue 5units long acting with sensitive SSI. Current regimen is similar to her outpatient regimen. Will likely discharge patient on home regimen to facilitate patient adherence.  - carb modified diet  - CBG monitoring - Daily BMP - Monitor electrolytes and replace as necessary  ESRD HTN She is receiving HD today. Hopeful discharge home on home insulin regimen with close follow up after HD complete.  Acute Encephalopathy - metabolic vs toxic Encephalopathy appears to have resolved with Treatment of hyperglycemia, HD, and holding of centrally acting medications. Favor toxic encephalopathy as etiology at this time. Appears to be at baseline.  - close outpatient follow up with cautious administration and reconsideration of centrally acting regimen will be crucial for this patient.  Left arm spasm Arm spasming appears to have resolved today with improvement in her strength and coordination. She has not had any twitching/spasms since admission to hospital. Suspect secondary to accumulation of centrally acting medications.  - continue to hold centrally acting medications  Hyperlipidemia - continue statin     LOS: 2 days   Delene Ruffini, MD 02/10/2022, 5:00 PM

## 2022-02-10 NOTE — Progress Notes (Addendum)
Mobility Specialist Progress Note   02/10/22 1147  Mobility  Activity Stood at bedside  Level of Assistance Standby assist, set-up cues, supervision of patient - no hands on  Assistive Device None  Activity Response Tolerated fair  $Mobility charge 1 Mobility   Pre Mobility: 95 HR, 133/87 BP, 94% SpO2 During Mobility: 113 HR, BP,  90% SpO2 Post Mobility: 101 HR, 133/85 BP, 91% SpO2  Pt presenting w/ general weakness today and requesting to sleep but w/ mod encouragement pt agreeable. Pt able to transition from supine>EOB>stand w/o physical assistance but requiring increase time. Once standing, pt having a feeling of "falling forward" but no account of LOB. Pt prematurely sitting down d/t LE fatigue, returned back to bed w/ call bell in reach and needs met.  Holland Falling Mobility Specialist MS [facility] #:  [phone] Acute Rehab Office:  [phone]

## 2022-02-10 NOTE — Progress Notes (Signed)
Patients Blood glucose is 428, MD notified. Awaiting orders.

## 2022-02-10 NOTE — Progress Notes (Signed)
Notified IMTS of glucose 360-no new orders- continue to [employer]

## 2022-02-10 NOTE — Discharge Instructions (Addendum)
Dear Mrs. Ann Robertson,   Thank you for trusting [place] with your care. We treated you in the hospital for high blood sugar. We will not make any changes to your current insulin regimen. We recommend that you follow up with your primary doctor for your diabetes.  We also treated you in the hospital for drowsiness and confusion. It is likely related to some of the medicines accumulating in your system. We have stopped the lyrica. We have continued the oxycodone for your chronic pain, but strongly recommend that you talk with your doctor about changing this medicine as well.

## 2022-02-11 DIAGNOSIS — E1065 Type 1 diabetes mellitus with hyperglycemia: Secondary | ICD-10-CM | POA: Diagnosis not present

## 2022-02-11 DIAGNOSIS — G9340 Encephalopathy, unspecified: Secondary | ICD-10-CM | POA: Diagnosis not present

## 2022-02-11 DIAGNOSIS — E876 Hypokalemia: Secondary | ICD-10-CM | POA: Diagnosis not present

## 2022-02-11 DIAGNOSIS — E1021 Type 1 diabetes mellitus with diabetic nephropathy: Secondary | ICD-10-CM | POA: Diagnosis not present

## 2022-02-11 LAB — GLUCOSE, CAPILLARY
Glucose-Capillary: 46 mg/dL — ABNORMAL LOW (ref 70–99)
Glucose-Capillary: 150 mg/dL — ABNORMAL HIGH (ref 70–99)
Glucose-Capillary: 50 mg/dL — ABNORMAL LOW (ref 70–99)
Glucose-Capillary: 57 mg/dL — ABNORMAL LOW (ref 70–99)
Glucose-Capillary: 104 mg/dL — ABNORMAL HIGH (ref 70–99)
Glucose-Capillary: 87 mg/dL (ref 70–99)
Glucose-Capillary: 134 mg/dL — ABNORMAL HIGH (ref 70–99)

## 2022-02-11 MED ORDER — INSULIN GLARGINE-YFGN 100 UNIT/ML ~~LOC~~ SOLN: 3.0000 [IU] | Freq: Every day | SUBCUTANEOUS | Status: DC

## 2022-02-11 MED ORDER — LANTUS SOLOSTAR 100 UNIT/ML ~~LOC~~ SOPN: 3.0000 [IU] | PEN_INJECTOR | Freq: Every day | SUBCUTANEOUS | 11 refills | Status: DC

## 2022-02-11 MED ORDER — BENZOCAINE 10 % MT GEL: Freq: Two times a day (BID) | OROMUCOSAL | 0 refills | Status: DC | PRN

## 2022-02-11 MED ORDER — DEXTROSE 50 % IV SOLN
INTRAVENOUS | Status: AC
  Administered 2022-02-11: 25 mL
  Filled 2022-02-11: qty 50

## 2022-02-11 NOTE — Plan of Care (Signed)
  Problem: Education: Goal: Ability to describe self-care measures that may prevent or decrease complications (Diabetes Survival Skills Education) will improve Outcome: Progressing   Problem: Respiratory: Goal: Will regain and/or maintain adequate ventilation Outcome: Progressing   Problem: Education: Goal: Knowledge of General Education information will improve Description: Including pain rating scale, medication(s)/side effects and non-pharmacologic comfort measures Outcome: Progressing

## 2022-02-11 NOTE — Care Management Important Message (Signed)
Important Message  Patient Details  Name: Ann Robertson MRN: [id number] Date of Birth: 08/16/1990   Medicare Important Message Given:  Yes     Hannah Beat 02/11/2022, 11:32 AM

## 2022-02-11 NOTE — Progress Notes (Signed)
D/C order noted. Contacted [place] to advise clinic of pt's d/c today and that pt will resume care tomorrow.   Melven Sartorius Renal Navigator [phone]

## 2022-02-11 NOTE — Progress Notes (Signed)
Mobility Specialist Progress Note   02/11/22 1200  Mobility  Activity Stood at bedside (+ x3 marches)  Level of Assistance Contact guard assist, steadying assist  Assistive Device None  Activity Response Tolerated fair  $Mobility charge 1 Mobility   Pt requiring mod encouragement for mobility but agreeable. Able to stand from EOB w/ stand by assist but requiring increase time d/t general weakness. While standing, pt only able to do x3 marches before getting light headed and having small instance of LOB. Sat safely back on EOB and lightheadedness subsided. Placed call bell in reach and met all needs.   Holland Falling Mobility Specialist MS [facility] #:  [phone] Acute Rehab Office:  [phone]

## 2022-02-11 NOTE — Progress Notes (Signed)
[facility] Progress Note   Subjective:   Seen in room - more hypoglycemia overnight, but feels ok today. Strength in L hand much improved today - can make fist. No CP/dyspnea.  Objective Vitals:   02/10/22 1744 02/10/22 2056 02/11/22 0125 02/11/22 0632  BP: 126/84 (!) 131/95 114/79 112/76  Pulse: 89 97 83 95  Resp: 15 16 18 18   Temp: 97.7 F (36.5 C) 98.9 F (37.2 C)  98.6 F (37 C)  TempSrc:  Oral  Oral  SpO2: 98% 100% 100% 100%  Weight: 53.4 kg     Height:       Physical Exam General: Chronically ill appearing woman, NAD. Room air. Heart: RRR; no murmur Lungs: CTAB; no rales Abdomen: soft Extremities: Trace BLE edema. Grip strength of L hand much better. Poor B radial pulses. Dialysis Access:  LUE AVG + bruit  Additional Objective Labs: Basic Metabolic Panel: Recent Labs  Lab 02/09/22 0127 02/09/22 1628 02/09/22 2058 02/10/22 0700  NA 131* 134*  --  129*  K 3.1* 3.4* 4.2 5.4*  CL 91* 96*  --  93*  CO2 26 29  --  24  GLUCOSE 225* 71  --  387*  BUN 34* 18  --  24*  CREATININE 6.14* 3.94*  --  4.63*  CALCIUM 8.8* 8.0*  --  7.9*  PHOS  --   --   --  2.5   Liver Function Tests: Recent Labs  Lab 02/08/22 2141 02/10/22 0700  AST 24 47*  ALT 19 23  ALKPHOS 198* 182*  BILITOT 1.1 1.4*  PROT 5.5* 5.6*  ALBUMIN 2.5* 2.5*  2.6*   CBC: Recent Labs  Lab 02/08/22 1231 02/08/22 1402 02/08/22 1705  WBC 4.4  --   --   NEUTROABS 2.8  --   --   HGB 11.9* 15.0 12.9  HCT 35.7* 44.0 38.0  MCV 88.4  --   --   PLT 162  --   --    CBG: Recent Labs  Lab 02/11/22 0138 02/11/22 0149 02/11/22 0207 02/11/22 0627 02/11/22 0905  GLUCAP 50* 57* 150* 104* 87    Medications:   amLODipine  10 mg Oral QHS   Chlorhexidine Gluconate Cloth  6 each Topical Daily   doxercalciferol  1 mcg Intravenous Q M,W,F-HD   feeding supplement  237 mL Oral BID BM   heparin  5,000 Units Subcutaneous Q8H   insulin aspart  0-5 Units Subcutaneous QHS   insulin  aspart  0-6 Units Subcutaneous TID WC   insulin glargine-yfgn  5 Units Subcutaneous Daily   labetalol  100 mg Oral BID   levothyroxine  25 mcg Oral QAC breakfast   rosuvastatin  20 mg Oral QHS    Dialysis Orders: MWF at [facility] 4hr, 400/500, EDW 50.8kg, 2K/2Ca, LUE AVG, heparin 2500 unit bolus - Mircera 35mcg IV q 4 weeks - Hectoral 24mcg IV q HD - HBsAg negative on 01/13/2022   Assessment/Plan:  Severe hyperglycemia: Not full blown DKA, was on IV insulin 9/11 - off now. Variable BS, primary adjusting her insulin.   Hypokalemia: On 9/12 with IV insulin, then back up/treatment with dialysis.  ESRD:  Usual MWF schedule, s/p HD 9/12, 9/13. Next 9/15 as OP.    Hypertension/volume: BP decent, getting closer to dry weight.  Anemia: Hgb > 11, no ESA for now.  Metabolic bone disease: CorrCa ok, Phos low side. VDRA resumed.  Nutrition:  Alb very low, continue supplements. 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 Reduced strength L hand: CT head negative for stroke. Possible neuropathy effect. Strength much improved today.  Veneta Penton, PA-C 02/11/2022, 9:24 AM  [employer]

## 2022-02-15 ENCOUNTER — Telehealth: Payer: Self-pay | Admitting: Nurse Practitioner

## 2022-02-21 ENCOUNTER — Other Ambulatory Visit: Payer: Self-pay

## 2022-02-21 ENCOUNTER — Inpatient Hospital Stay (HOSPITAL_COMMUNITY)
Admission: EM | Admit: 2022-02-21 | Discharge: 2022-02-24 | DRG: 637 | Disposition: A | Discharge status: Home / Self Care | Payer: Medicare Other | Attending: Internal Medicine | Admitting: Internal Medicine

## 2022-02-21 ENCOUNTER — Emergency Department (HOSPITAL_COMMUNITY): Payer: Medicare Other

## 2022-02-21 DIAGNOSIS — E1042 Type 1 diabetes mellitus with diabetic polyneuropathy: Secondary | ICD-10-CM | POA: Diagnosis present

## 2022-02-21 DIAGNOSIS — M898X9 Other specified disorders of bone, unspecified site: Secondary | ICD-10-CM | POA: Diagnosis present

## 2022-02-21 DIAGNOSIS — E875 Hyperkalemia: Secondary | ICD-10-CM | POA: Diagnosis not present

## 2022-02-21 DIAGNOSIS — E1165 Type 2 diabetes mellitus with hyperglycemia: Secondary | ICD-10-CM

## 2022-02-21 DIAGNOSIS — Z833 Family history of diabetes mellitus: Secondary | ICD-10-CM

## 2022-02-21 DIAGNOSIS — G894 Chronic pain syndrome: Secondary | ICD-10-CM | POA: Diagnosis present

## 2022-02-21 DIAGNOSIS — E7800 Pure hypercholesterolemia, unspecified: Secondary | ICD-10-CM | POA: Diagnosis present

## 2022-02-21 DIAGNOSIS — F17210 Nicotine dependence, cigarettes, uncomplicated: Secondary | ICD-10-CM | POA: Diagnosis not present

## 2022-02-21 DIAGNOSIS — R569 Unspecified convulsions: Secondary | ICD-10-CM | POA: Diagnosis present

## 2022-02-21 DIAGNOSIS — Z83438 Family history of other disorder of lipoprotein metabolism and other lipidemia: Secondary | ICD-10-CM

## 2022-02-21 DIAGNOSIS — R768 Other specified abnormal immunological findings in serum: Secondary | ICD-10-CM | POA: Diagnosis not present

## 2022-02-21 DIAGNOSIS — N2581 Secondary hyperparathyroidism of renal origin: Secondary | ICD-10-CM | POA: Diagnosis present

## 2022-02-21 DIAGNOSIS — R252 Cramp and spasm: Secondary | ICD-10-CM | POA: Diagnosis present

## 2022-02-21 DIAGNOSIS — Z794 Long term (current) use of insulin: Secondary | ICD-10-CM

## 2022-02-21 DIAGNOSIS — E10649 Type 1 diabetes mellitus with hypoglycemia without coma: Secondary | ICD-10-CM | POA: Diagnosis not present

## 2022-02-21 DIAGNOSIS — E870 Hyperosmolality and hypernatremia: Secondary | ICD-10-CM | POA: Diagnosis present

## 2022-02-21 DIAGNOSIS — Z823 Family history of stroke: Secondary | ICD-10-CM

## 2022-02-21 DIAGNOSIS — D631 Anemia in chronic kidney disease: Secondary | ICD-10-CM | POA: Diagnosis present

## 2022-02-21 DIAGNOSIS — R7401 Elevation of levels of liver transaminase levels: Secondary | ICD-10-CM | POA: Diagnosis present

## 2022-02-21 DIAGNOSIS — Z992 Dependence on renal dialysis: Secondary | ICD-10-CM

## 2022-02-21 DIAGNOSIS — E1069 Type 1 diabetes mellitus with other specified complication: Principal | ICD-10-CM | POA: Diagnosis present

## 2022-02-21 DIAGNOSIS — E1021 Type 1 diabetes mellitus with diabetic nephropathy: Secondary | ICD-10-CM

## 2022-02-21 DIAGNOSIS — E039 Hypothyroidism, unspecified: Secondary | ICD-10-CM | POA: Diagnosis present

## 2022-02-21 DIAGNOSIS — Z79890 Hormone replacement therapy (postmenopausal): Secondary | ICD-10-CM | POA: Diagnosis not present

## 2022-02-21 DIAGNOSIS — I120 Hypertensive chronic kidney disease with stage 5 chronic kidney disease or end stage renal disease: Secondary | ICD-10-CM | POA: Diagnosis present

## 2022-02-21 DIAGNOSIS — Z87891 Personal history of nicotine dependence: Secondary | ICD-10-CM | POA: Diagnosis not present

## 2022-02-21 DIAGNOSIS — R159 Full incontinence of feces: Secondary | ICD-10-CM | POA: Diagnosis present

## 2022-02-21 DIAGNOSIS — E1122 Type 2 diabetes mellitus with diabetic chronic kidney disease: Secondary | ICD-10-CM | POA: Diagnosis not present

## 2022-02-21 DIAGNOSIS — R748 Abnormal levels of other serum enzymes: Secondary | ICD-10-CM | POA: Diagnosis present

## 2022-02-21 DIAGNOSIS — Z79899 Other long term (current) drug therapy: Secondary | ICD-10-CM | POA: Diagnosis not present

## 2022-02-21 DIAGNOSIS — G9341 Metabolic encephalopathy: Secondary | ICD-10-CM | POA: Diagnosis present

## 2022-02-21 DIAGNOSIS — E1065 Type 1 diabetes mellitus with hyperglycemia: Secondary | ICD-10-CM | POA: Diagnosis present

## 2022-02-21 DIAGNOSIS — Z560 Unemployment, unspecified: Secondary | ICD-10-CM

## 2022-02-21 DIAGNOSIS — B370 Candidal stomatitis: Secondary | ICD-10-CM | POA: Diagnosis present

## 2022-02-21 DIAGNOSIS — Z841 Family history of disorders of kidney and ureter: Secondary | ICD-10-CM

## 2022-02-21 DIAGNOSIS — E1100 Type 2 diabetes mellitus with hyperosmolarity without nonketotic hyperglycemic-hyperosmolar coma (NKHHC): Secondary | ICD-10-CM | POA: Diagnosis not present

## 2022-02-21 DIAGNOSIS — N186 End stage renal disease: Secondary | ICD-10-CM | POA: Diagnosis present

## 2022-02-21 DIAGNOSIS — E1022 Type 1 diabetes mellitus with diabetic chronic kidney disease: Secondary | ICD-10-CM | POA: Diagnosis present

## 2022-02-21 DIAGNOSIS — Z885 Allergy status to narcotic agent status: Secondary | ICD-10-CM | POA: Diagnosis not present

## 2022-02-21 DIAGNOSIS — Z8249 Family history of ischemic heart disease and other diseases of the circulatory system: Secondary | ICD-10-CM

## 2022-02-21 DIAGNOSIS — E8779 Other fluid overload: Secondary | ICD-10-CM | POA: Diagnosis present

## 2022-02-21 DIAGNOSIS — R410 Disorientation, unspecified: Secondary | ICD-10-CM | POA: Diagnosis present

## 2022-02-21 DIAGNOSIS — K529 Noninfective gastroenteritis and colitis, unspecified: Secondary | ICD-10-CM | POA: Diagnosis present

## 2022-02-21 DIAGNOSIS — R600 Localized edema: Secondary | ICD-10-CM | POA: Diagnosis present

## 2022-02-21 LAB — CBG MONITORING, ED
Glucose-Capillary: >600 mg/dL (ref 70–99)
Glucose-Capillary: >600 mg/dL (ref 70–99)
Glucose-Capillary: >600 mg/dL (ref 70–99)
Glucose-Capillary: >600 mg/dL (ref 70–99)
Glucose-Capillary: 599 mg/dL (ref 70–99)
Glucose-Capillary: 548 mg/dL (ref 70–99)

## 2022-02-21 LAB — CBC WITH DIFFERENTIAL/PLATELET
Abs Immature Granulocytes: 0.01 10*3/uL (ref 0.00–0.07)
Basophils Absolute: 0 10*3/uL (ref 0.0–0.1)
Basophils Relative: 1 %
Eosinophils Absolute: 0.1 10*3/uL (ref 0.0–0.5)
Eosinophils Relative: 2 %
HCT: 29.3 % — ABNORMAL LOW (ref 36.0–46.0)
Hemoglobin: 8.9 g/dL — ABNORMAL LOW (ref 12.0–15.0)
Immature Granulocytes: 0 %
Lymphocytes Relative: 18 %
Lymphs Abs: 0.8 10*3/uL (ref 0.7–4.0)
MCH: 29.2 pg (ref 26.0–34.0)
MCHC: 30.4 g/dL (ref 30.0–36.0)
MCV: 96.1 fL (ref 80.0–100.0)
Monocytes Absolute: 0.3 10*3/uL (ref 0.1–1.0)
Monocytes Relative: 6 %
Neutro Abs: 3.3 10*3/uL (ref 1.7–7.7)
Neutrophils Relative %: 73 %
Platelets: 201 10*3/uL (ref 150–400)
RBC: 3.05 MIL/uL — ABNORMAL LOW (ref 3.87–5.11)
RDW: 15.5 % (ref 11.5–15.5)
WBC: 4.6 10*3/uL (ref 4.0–10.5)
nRBC: 0 % (ref 0.0–0.2)

## 2022-02-21 LAB — COMPREHENSIVE METABOLIC PANEL
ALT: 27 U/L (ref 0–44)
AST: 50 U/L — ABNORMAL HIGH (ref 15–41)
Albumin: 2.7 g/dL — ABNORMAL LOW (ref 3.5–5.0)
Alkaline Phosphatase: 242 U/L — ABNORMAL HIGH (ref 38–126)
Anion gap: 16 — ABNORMAL HIGH (ref 5–15)
BUN: 36 mg/dL — ABNORMAL HIGH (ref 6–20)
CO2: 23 mmol/L (ref 22–32)
Calcium: 8.3 mg/dL — ABNORMAL LOW (ref 8.9–10.3)
Chloride: 84 mmol/L — ABNORMAL LOW (ref 98–111)
Creatinine, Ser: 4.76 mg/dL — ABNORMAL HIGH (ref 0.44–1.00)
GFR, Estimated: 12 mL/min — ABNORMAL LOW (ref >60)
Glucose, Bld: 1179 mg/dL (ref 70–99)
Potassium: 5 mmol/L (ref 3.5–5.1)
Sodium: 123 mmol/L — ABNORMAL LOW (ref 135–145)
Total Bilirubin: 1.1 mg/dL (ref 0.3–1.2)
Total Protein: 5.9 g/dL — ABNORMAL LOW (ref 6.5–8.1)

## 2022-02-21 LAB — URINALYSIS, ROUTINE W REFLEX MICROSCOPIC
Bacteria, UA: NONE SEEN
Bilirubin Urine: NEGATIVE
Glucose, UA: >=500 mg/dL — AB
Ketones, ur: 5 mg/dL — AB
Leukocytes,Ua: NEGATIVE
Nitrite: NEGATIVE
Protein, ur: >=300 mg/dL — AB
Specific Gravity, Urine: 1.02 (ref 1.005–1.030)
pH: 5 (ref 5.0–8.0)

## 2022-02-21 LAB — RAPID URINE DRUG SCREEN, HOSP PERFORMED
Amphetamines: NOT DETECTED
Barbiturates: NOT DETECTED
Benzodiazepines: NOT DETECTED
Cocaine: NOT DETECTED
Opiates: NOT DETECTED
Tetrahydrocannabinol: NOT DETECTED

## 2022-02-21 LAB — I-STAT VENOUS BLOOD GAS, ED
Acid-base deficit: 2 mmol/L (ref 0.0–2.0)
Bicarbonate: 23.8 mmol/L (ref 20.0–28.0)
Calcium, Ion: 1.03 mmol/L — ABNORMAL LOW (ref 1.15–1.40)
HCT: 34 % — ABNORMAL LOW (ref 36.0–46.0)
Hemoglobin: 11.6 g/dL — ABNORMAL LOW (ref 12.0–15.0)
O2 Saturation: 98 %
Potassium: 5 mmol/L (ref 3.5–5.1)
Sodium: 120 mmol/L — ABNORMAL LOW (ref 135–145)
TCO2: 25 mmol/L (ref 22–32)
pCO2, Ven: 44.1 mmHg (ref 44–60)
pH, Ven: 7.341 (ref 7.25–7.43)
pO2, Ven: 121 mmHg — ABNORMAL HIGH (ref 32–45)

## 2022-02-21 LAB — BASIC METABOLIC PANEL
Anion gap: 17 — ABNORMAL HIGH (ref 5–15)
BUN: 35 mg/dL — ABNORMAL HIGH (ref 6–20)
CO2: 21 mmol/L — ABNORMAL LOW (ref 22–32)
Calcium: 8.1 mg/dL — ABNORMAL LOW (ref 8.9–10.3)
Chloride: 85 mmol/L — ABNORMAL LOW (ref 98–111)
Creatinine, Ser: 4.37 mg/dL — ABNORMAL HIGH (ref 0.44–1.00)
GFR, Estimated: 13 mL/min — ABNORMAL LOW (ref >60)
Glucose, Bld: 962 mg/dL (ref 70–99)
Potassium: 4.9 mmol/L (ref 3.5–5.1)
Sodium: 123 mmol/L — ABNORMAL LOW (ref 135–145)

## 2022-02-21 LAB — I-STAT BETA HCG BLOOD, ED (MC, WL, AP ONLY): I-stat hCG, quantitative: <5 m[IU]/mL (ref <5)

## 2022-02-21 LAB — PHOSPHORUS: Phosphorus: 4.6 mg/dL (ref 2.5–4.6)

## 2022-02-21 LAB — MAGNESIUM: Magnesium: 1.6 mg/dL — ABNORMAL LOW (ref 1.7–2.4)

## 2022-02-21 LAB — LIPASE, BLOOD: Lipase: 24 U/L (ref 11–51)

## 2022-02-21 LAB — BETA-HYDROXYBUTYRIC ACID: Beta-Hydroxybutyric Acid: 0.2 mmol/L (ref 0.05–0.27)

## 2022-02-21 MED ORDER — DEXTROSE 50 % IV SOLN
0.0000 mL | INTRAVENOUS | Status: DC | PRN
  Administered 2022-02-22: 20 mL via INTRAVENOUS
  Administered 2022-02-22 – 2022-02-23 (×3): 50 mL via INTRAVENOUS
  Administered 2022-02-23: 25 mL via INTRAVENOUS
  Filled 2022-02-21 (×4): qty 50

## 2022-02-21 MED ORDER — DEXTROSE IN LACTATED RINGERS 5 % IV SOLN: INTRAVENOUS | Status: DC

## 2022-02-21 MED ORDER — INSULIN REGULAR(HUMAN) IN NACL 100-0.9 UT/100ML-% IV SOLN
INTRAVENOUS | Status: DC
  Administered 2022-02-21: 4.8 [IU]/h via INTRAVENOUS
  Filled 2022-02-21: qty 100

## 2022-02-21 MED ORDER — SODIUM CHLORIDE 0.9 % IV BOLUS: 1000.0000 mL | Freq: Once | INTRAVENOUS | Status: DC

## 2022-02-21 MED ORDER — LACTATED RINGERS IV SOLN: INTRAVENOUS | Status: DC

## 2022-02-21 MED ORDER — LACTATED RINGERS IV BOLUS
20.0000 mL/kg | Freq: Once | INTRAVENOUS | Status: AC
  Administered 2022-02-21: 1068 mL via INTRAVENOUS

## 2022-02-21 MED ORDER — HEPARIN SODIUM (PORCINE) 5000 UNIT/ML IJ SOLN
5000.0000 [IU] | Freq: Three times a day (TID) | INTRAMUSCULAR | Status: DC
  Administered 2022-02-22 – 2022-02-23 (×2): 5000 [IU] via SUBCUTANEOUS
  Filled 2022-02-21 (×5): qty 1

## 2022-02-21 NOTE — ED Triage Notes (Signed)
Pt BIB EMS due to seizure like activity. Pts left hand, eye twitching, conscious but not alert. EMS gave 5 IM versed and it stopped seizure like activity. Pt is type 1 diabetic, and BS reads high. Pt dialysis pt, went Friday, complaint. VSS.

## 2022-02-21 NOTE — ED Notes (Signed)
Patient had BM on herself. Patient cleaned and new brief placed.

## 2022-02-21 NOTE — ED Provider Notes (Cosign Needed Addendum)
[facility] EMERGENCY DEPARTMENT Provider Note   CSN: [id number] Arrival date & time: 02/21/22  1536     History  Chief Complaint  Patient presents with   Seizures    Ann Robertson is a 31 y.o. female.  The history is provided by the EMS personnel and medical records. No language interpreter was used.  Seizures    31 year old female significant history of type 1 diabetes, hypertension, CKD, end-stage renal disease currently on Monday Wednesday Friday dialysis brought here via EMS due to seizure-like activity.  Per EMS, family member mentioned that patient has had seizure-like activities for the past week with tremors on the left side of her body and increased confusion.  When EMS arrived, they noted that patient was having some twitching movements to her left arm as well as her left face.  EMS gave patient 5 mg of IM Versed which ceased her tremors.  EMS also noted that her CBG read high.  Patient also is a dialysis patient last dialysis was 2 days ago and family report patient went for her full session.  Patient also was admitted to the hospital a week ago for DKA.  Home Medications Prior to Admission medications   Medication Sig Start Date End Date Taking? Authorizing Provider  benzocaine (ORAJEL) 10 % mucosal gel Use as directed in the mouth or throat 2 (two) times daily as needed for mouth pain. 02/11/22   Delene Ruffini, MD  fluconazole (DIFLUCAN) 100 MG tablet Take 100 mg by mouth daily. 12/14/21   [provider]  insulin glargine (LANTUS SOLOSTAR) 100 UNIT/ML Solostar Pen Inject 3 Units into the skin daily. Inject 2-5 units as needed for High glucose levels 02/11/22   Delene Ruffini, MD  insulin lispro (HUMALOG) 100 UNIT/ML KwikPen Inject 1-6 Units into the skin See admin instructions. Inject 1-6 units up to three times daily with meals (only if eating) per sliding scale based on CBG and Carb count    [provider]  labetalol  (NORMODYNE) 100 MG tablet Take 100 mg by mouth 2 (two) times daily. 03/26/21   [provider]  lidocaine-prilocaine (EMLA) cream Apply 1 Application topically as needed (For port access). 11/17/21   [provider]  omeprazole (PRILOSEC) 20 MG capsule Take 20 mg by mouth daily as needed (acid reflux/heartburn).    [provider]  ondansetron (ZOFRAN) 4 MG tablet Take 1 tablet (4 mg total) by mouth every 8 (eight) hours as needed for nausea or vomiting. 06/10/20   Renato Shin, MD  oxyCODONE-acetaminophen (PERCOCET) 10-325 MG tablet Take 1 tablet by mouth every 4 (four) hours as needed for pain. 02/13/21   [provider]  rosuvastatin (CRESTOR) 20 MG tablet Take 20 mg by mouth at bedtime. 03/16/21   [provider]      Allergies    Hydromorphone hcl    Review of Systems   Review of Systems  Unable to perform ROS: Mental status change  Neurological:  Positive for seizures.    Physical Exam Updated Vital Signs BP (!) 164/102   Pulse 87   Temp 97.9 F (36.6 C) (Axillary)   Resp 13   Ht 5\' 5"  (1.651 m)   Wt 53 kg   SpO2 100%   BMI 19.44 kg/m  Physical Exam Vitals and nursing note reviewed.  Constitutional:      General: She is not in acute distress.    Appearance: She is well-developed. She is ill-appearing.  HENT:  Head: Normocephalic and atraumatic.     Mouth/Throat:     Mouth: Mucous membranes are dry.  Eyes:     Conjunctiva/sclera: Conjunctivae normal.     Pupils: Pupils are equal, round, and reactive to light.  Cardiovascular:     Rate and Rhythm: Normal rate and regular rhythm.  Pulmonary:     Effort: Pulmonary effort is normal.  Abdominal:     Palpations: Abdomen is soft.  Musculoskeletal:     Cervical back: Normal range of motion and neck supple.  Skin:    Findings: No rash.     Comments: Left AV fistula with palpable thrill and bruit.  Neurological:     Mental Status: She is disoriented.  Psychiatric:         Mood and Affect: Mood normal.     ED Results / Procedures / Treatments   Labs (all labs ordered are listed, but only abnormal results are displayed) Labs Reviewed  CBC WITH DIFFERENTIAL/PLATELET - Abnormal; Notable for the following components:      Result Value   RBC 3.05 (*)    Hemoglobin 8.9 (*)    HCT 29.3 (*)    All other components within normal limits  COMPREHENSIVE METABOLIC PANEL - Abnormal; Notable for the following components:   Sodium 123 (*)    Chloride 84 (*)    Glucose, Bld 1,179 (*)    BUN 36 (*)    Creatinine, Ser 4.76 (*)    Calcium 8.3 (*)    Total Protein 5.9 (*)    Albumin 2.7 (*)    AST 50 (*)    Alkaline Phosphatase 242 (*)    GFR, Estimated 12 (*)    Anion gap 16 (*)    All other components within normal limits  URINALYSIS, ROUTINE W REFLEX MICROSCOPIC - Abnormal; Notable for the following components:   Glucose, UA >=500 (*)    Hgb urine dipstick SMALL (*)    Ketones, ur 5 (*)    Protein, ur >=300 (*)    All other components within normal limits  I-STAT VENOUS BLOOD GAS, ED - Abnormal; Notable for the following components:   pO2, Ven 121 (*)    Sodium 120 (*)    Calcium, Ion 1.03 (*)    HCT 34.0 (*)    Hemoglobin 11.6 (*)    All other components within normal limits  CBG MONITORING, ED - Abnormal; Notable for the following components:   Glucose-Capillary >600 (*)    All other components within normal limits  CBG MONITORING, ED - Abnormal; Notable for the following components:   Glucose-Capillary >600 (*)    All other components within normal limits  CBG MONITORING, ED - Abnormal; Notable for the following components:   Glucose-Capillary >600 (*)    All other components within normal limits  LIPASE, BLOOD  RAPID URINE DRUG SCREEN, HOSP PERFORMED  BETA-HYDROXYBUTYRIC ACID  BETA-HYDROXYBUTYRIC ACID  BASIC METABOLIC PANEL  I-STAT BETA HCG BLOOD, ED ([facility], [facility], [facility] ONLY)    EKG EKG Interpretation  Date/Time:  Sunday February 21 2022  16:02:35 EDT Ventricular Rate:  91 PR Interval:  143 QRS Duration: 89 QT Interval:  353 QTC Calculation: 435 R Axis:   131 Text Interpretation: Sinus rhythm Right axis deviation Nonspecific T abnormalities, lateral leads since last tracing no significant change Confirmed by Malvin Johns [phone]) on 02/21/2022 4:05:26 PM  Radiology No results found.  Procedures .Critical Care  Performed by: Domenic Moras, PA-C Authorized by: Domenic Moras, PA-C  Critical care provider statement:    Critical care time (minutes):  30   Critical care was time spent personally by me on the following activities:  Development of treatment plan with patient or surrogate, discussions with consultants, evaluation of patient's response to treatment, examination of patient, ordering and review of laboratory studies, ordering and review of radiographic studies, ordering and performing treatments and interventions, pulse oximetry, re-evaluation of patient's condition and review of old charts     Medications Ordered in ED Medications  insulin regular, human (MYXREDLIN) 100 units/ 100 mL infusion (4.8 Units/hr Intravenous New Bag/Given 02/21/22 1659)  lactated ringers infusion ( Intravenous New Bag/Given 02/21/22 2039)  dextrose 5 % in lactated ringers infusion (has no administration in time range)  dextrose 50 % solution 0-50 mL (has no administration in time range)  heparin injection 5,000 Units (5,000 Units Subcutaneous Not Given 02/21/22 2315)  lactated ringers bolus 1,068 mL (0 mLs Intravenous Stopped 02/21/22 1902)    ED Course/ Medical Decision Making/ A&P                           Medical Decision Making Amount and/or Complexity of Data Reviewed Labs: ordered. Radiology: ordered.  Risk Prescription drug management. Decision regarding hospitalization.   BP (!) 164/102   Pulse 87   Temp 97.9 F (36.6 C) (Axillary)   Resp 13   Ht 5\' 5"  (1.651 m)   Wt 53 kg   SpO2 100%   BMI 19.44 kg/m   4:11  PM This is a 31 year old female significant history of type 1 diabetes, also history of end-stage renal disease currently on Monday Wednesday Friday dialysis who was brought here via EMS from home due to having seizure-like activity.  Family member mentioned that patient has had recurrent twitches to her left side of body and face and appears altered for the past week.  She was previously admitted to the hospital for DKA with similar presentation.  When EMS arrived  CBG was reading high.  Patient also has reported tremors on the left side of her body which resolved shortly after she received IM Versed.  On exam patient is altered but does respond to painful stimuli.  She appears dry.  CBG obtained here reading high as well.  Work-up initiated.  We will also initiate glucose stabilizer protocol.  Care discussed with Dr. Tamera Punt.  7:08 PM Labs and EKG obtained independently viewed interpreted by me.  Labs remarkable for CBG of 1179, with an anion gap of 16 suspicious of DKA versus , HHS.  Urinalysis have not resulted yet.  She also has a low sodium of 123 likely pseudohyponatremia in the setting of elevated CBG.  Normal lipase, normal WBC, hemoglobin is 8.9 although lower than prior values but likely baseline when compared to previous hemoglobin.  pH is 7.34.  Pregnancy test negative, UDS without any detectable drugs  Appreciate consultation from Internal Medicine resident who agrees to see and will admit pt for further care of her HHS.    This patient presents to the ED for concern of AMS, this involves an extensive number of treatment options, and is a complaint that carries with it a high risk of complications and morbidity.  The differential diagnosis includes DKA, HHS, sepsis, stroke, meningitis, electrolytes derangement  Co morbidities that complicate the patient evaluation type 1 diabetes  HTN  ESRD Additional history obtained:  Additional history obtained from EMS External records from outside  source obtained  and reviewed including EMR including prior labs and imaging  Lab Tests:  I Ordered, and personally interpreted labs.  The pertinent results include:  as above  Imaging Studies ordered:  I ordered imaging studies including head CT I independently visualized and interpreted imaging which showed no acute finding I agree with the radiologist interpretation  Cardiac Monitoring:  The patient was maintained on a cardiac monitor.  I personally viewed and interpreted the cardiac monitored which showed an underlying rhythm of: NSR  Medicines ordered and prescription drug management:  I ordered medication including insuline  for HHS Reevaluation of the patient after these medicines showed that the patient improved I have reviewed the patients home medicines and have made adjustments as needed  Test Considered: as above  Critical Interventions: glucostabilizer  Consultations Obtained:  I requested consultation with the hospitalist ,  and discussed lab and imaging findings as well as pertinent plan - they recommend: admission  Problem List / ED Course: HHS  Reevaluation:  After the interventions noted above, I reevaluated the patient and found that they have :improved  Social Determinants of Health: tobacco use  Dispostion:  After consideration of the diagnostic results and the patients response to treatment, I feel that the patent would benefit from admission.         Final Clinical Impression(s) / ED Diagnoses Final diagnoses:  Hyperosmolar hyperglycemic state (HHS) [facility])    Rx / DC Orders ED Discharge Orders     None         Domenic Moras, PA-C 02/22/22 0000    Domenic Moras, PA-C 02/22/22 0001    Malvin Johns, MD 02/25/22 [phone]

## 2022-02-21 NOTE — H&P (Addendum)
Date: 02/21/2022               Patient Name:  Ann Robertson MRN: [id number]  DOB: 1990-09-09 Age / Sex: 31 y.o., female   PCP: [facility] [place] Service: Internal Medicine Teaching Service         Attending Physician: Dr. Velna Ochs, MD    First Contact: Linward Natal, MD Pager: [phone]   Second Contact: Quenten Raven Pager: [phone]        After Hours (After 5p/  First Contact Pager: [phone]  weekends / holidays): Second Contact Pager: [phone]   SUBJECTIVE   Chief Complaint: Toy Care of her hands  History of Present Illness:  Ms. Cookie Pore is a 31yo F with a PMHx of uncontrolled T1DM, ESRD on dialysis, M/W/F, HLD, and L arm spasms, who presented to the [facility] with worsening L arm spasms and high blood sugars  Patient was in her usual state of health until today when she experienced recurrence of shaking in both of her arms, with the L worse than the R. She called her family to the room and they called EMS to be taken to the hospital.   Initially, the 'twitching' episodes were intermittent, but have been progressively gotten constant. She has been conscious during these episodes and there is no pain, temperature changes, confusion, trouble swallowing, new bowel or bladder incontinence associated with them. She reports that this was the same issue that brought her to the hospital from which she was discharged on 9/14. Patient reports previously taking Lyrica, which was discontinued at last hospitalization, and continued on a lower dose of gabapentin. However, she stopped taking on Friday. She has been without twitches since last hospitalization until today with some decreased strength in the L arm.   Patient has also known history of uncontrolled diabetes type 1. She noted that her son gave her 8 units on insulin this am. Since, she checked once midday and her BG was low in 30s. She corrected by drinking ~2 bottles of orange juice. She did  not recheck her BG since the am. She does not own on continuous blood glucose monitoring device. She does have a glucometer and strips at home but does not like checking her BG. At presentation, patient endorsed headache, nausea, but deneies confusion, vomiting, abdominal pain. She has been tolerating fluids.   Of note, patient has chronic diarrhea with incontinence, and has been told this is in the setting of her diabetes. She treats it with Imodium PRN. Patient was at dialysis on 9/22 and completed the last session. She is followed by Dr. Hollie Salk. Patient is able to make some urine still.    ED Course: BG 1179, bicarbonate of 23, corrected Na 137, K 5, BHB 0.20, vBG 7.3/44/23.8, +urine ketones, +urine glucose >>500. Patient was started on EndoTool with 1x 1L IVF bolus. Patient was admitted to IMTS for observation.  Meds:   Amlodipine Fluconazole Humalog Labetalol Lantus Levothyroxine Lidocaine-prilocaine Omeprazole Ondansetron Percocet Rosuvastatin Pregabalin 50 mg daily Vitamin D  Past Medical History ESRD T1DM  Past Surgical History:  Procedure Laterality Date   AV FISTULA PLACEMENT Left 06/19/2021   Procedure: INSERTION OF ARTERIOVENOUS (AV) GORE-TEX GRAFT ARM;  Surgeon: Angelia Mould, MD;  Location: [place];  Service: Vascular;  Laterality: Left;   CESAREAN SECTION     CESAREAN SECTION N/A 07/05/2013   Procedure: CESAREAN SECTION;  Surgeon: Emily Filbert, MD;  Location: [facility];  Service:  Obstetrics;  Laterality: N/A;   CESAREAN SECTION N/A 07/25/2014   Procedure: CESAREAN SECTION;  Surgeon: Donnamae Jude, MD;  Location: [facility];  Service: Obstetrics;  Laterality: N/A;   CESAREAN SECTION N/A 11/21/2015   Procedure: CESAREAN SECTION;  Surgeon: Donnamae Jude, MD;  Location: [place];  Service: Obstetrics;  Laterality: N/A;   IR FLUORO GUIDE CV LINE RIGHT  06/16/2021   IR US GUIDE VASC ACCESS RIGHT  06/16/2021   WISDOM TOOTH EXTRACTION     Social:  Lives With: Live  with 4 children 15, 8, 7, 6.  Occupation: Not currently employed  Support: Family in the area Level of Function: Able to perform ADL/IADL. Ambulates without assistance PCP: [facility] Substances: Tobacco use for 12 years 1ppd, quit 1 year ago. Occasional alcohol use.   Family History:  Family History  Problem Relation Age of Onset   Stroke Mother     Hypertension Mother     Heart disease Mother     Kidney disease Mother     Hyperlipidemia Father     Diabetes Father     Allergies: Allergies as of 02/21/2022 - Review Complete 02/21/2022  Allergen Reaction Noted   Hydromorphone hcl Itching and Rash 01/02/2013   Review of Systems: A complete ROS was negative except as per HPI.   OBJECTIVE:   Physical Exam: Blood pressure (!) 164/102, pulse 87, temperature 97.9 F (36.6 C), temperature source Axillary, resp. rate 13, height 5' 5"  (1.651 m), weight 53 kg, SpO2 100 %.  Constitutional: ill-appearing woman sitting in bed, in no acute distress HENT: hazy lens in the L and R eye,normocephalic atraumatic, mucous membranes moist, white plaques on tongue surface Eyes: conjunctiva non-erythematous, conjunctival pallor Neck: supple Cardiovascular: regular rate and rhythm, no m/r/g. 1+ bilateral PD. LUE fistula with good thrill. +bruit.  Pulmonary/Chest: normal work of breathing on room air, lungs clear to auscultation bilaterally Abdominal: soft, non-tender, non-distended MSK: Decreased bulk throughout, 1+ pitting edema. Neurological: alert & oriented x 3, CN nerve exams intact, patient did not follow commands for upper strength exam, but was able to hold to railing during pulm exam without problem. 3-4/5 lower extremities, Sensation was symmetrical and equal bilaterally. gait not assessed. Skin: warm and dry Psych: Normal mood and appropriate affect  Labs:  CBC    Component Value Date/Time   WBC 4.6 02/21/2022 1611   RBC 3.05 (L) 02/21/2022 1611   HGB 11.6 (L)  02/21/2022 1639   HGB 12.7 06/06/2015 0000   HCT 34.0 (L) 02/21/2022 1639   HCT 39 06/06/2015 0000   PLT 201 02/21/2022 1611   PLT 383 06/06/2015 0000   MCV 96.1 02/21/2022 1611   MCH 29.2 02/21/2022 1611   MCHC 30.4 02/21/2022 1611   RDW 15.5 02/21/2022 1611   LYMPHSABS 0.8 02/21/2022 1611   MONOABS 0.3 02/21/2022 1611   EOSABS 0.1 02/21/2022 1611   BASOSABS 0.0 02/21/2022 1611     CMP     Component Value Date/Time   NA 123 (L) 02/21/2022 1910   K 4.9 02/21/2022 1910   CL 85 (L) 02/21/2022 1910   CO2 21 (L) 02/21/2022 1910   GLUCOSE 962 (HH) 02/21/2022 1910   BUN 35 (H) 02/21/2022 1910   CREATININE 4.37 (H) 02/21/2022 1910   CREATININE 0.64 11/06/2015 1125   CALCIUM 8.1 (L) 02/21/2022 1910   PROT 5.9 (L) 02/21/2022 1611   ALBUMIN 2.7 (L) 02/21/2022 1611   AST 50 (H) 02/21/2022 1611   ALT  27 02/21/2022 1611   ALKPHOS 242 (H) 02/21/2022 1611   BILITOT 1.1 02/21/2022 1611   GFRNONAA 13 (L) 02/21/2022 1910   GFRAA >60 02/18/2019 1752    Imaging: None  EKG: personally reviewed my interpretation is normal sinus rhythm, T wave inversions in the lateral leads. Prior EKG at last hospitalization 9/12 with similar findings.  ASSESSMENT & PLAN:   Assessment & Plan by Problem: Principal Problem:   Hyperosmolar hyperglycemic state (HHS) ([place])   Amonie Wisser is a 31 y.o. person living with a history of HTN, uncontrolled type 1 diabetes mellitus, ESRD  on dialysis (M/W/F), L arm spasms who presented with worsening L arm spasms and hyperglycemia and admitted for  diabetic hyperglycemic hyperosmolar syndrome  on hospital day 0  HHS Uncontrolled type 1 DM with nephropathy Pseudohyponatremia Last A1c on 9/11 14.3. On admission on 02/21/2022: BG 1179, bicarbonate of 23, Na 123, corrected Na 137, K 5, BHB 0.20, vBG 7.3/44/23.8, +urine ketones, +urine glucose. Serum osm 319. Patient received 1L bolus of NaCl on admission. Patient was NPO, on EndoTool without K repletion given ESRD  status  and continuous IVF replacement until BG <200. Patient now has been transitioned to [facility] 10 and 4u TID of Novolog with sensitive SSI. Patient is alert, and with stable vital signs.  -q4 cBG, replete K if <3.0 -Dialysis in the AM -Started on Semglee 10, 4u Novolog with meals -On correction sensitive SSI  ESRD Followed with Dr. Hollie Salk at [facility]. Patient was initiated on hemodialysis through LUE AV fistula since January of 2023 for progressive CKD likely in the setting of uncontrolled T1DM and HTN. No biopsy on file. Patient is currently dialyzed on M/W/F, last session on F 9/22. K 5.0 on admission with mild fluid overload (pitting edema 1+).  - Monitor fluid status - Monitor K - Strict I/Os - Avoid renally metabolized drugs - Consult nephrology in the AM for hemodialysis  Upper extremity spasms Patient with history of L>R spasm with previous improvement on Lyrica. However, patient developed myoclonic jerking and was transitioned off lyrica at last hospitalization on 02/09/2022 as this was thought to be the culprit. Patient restarted Gabapentin 100 mg TID since discharge, but stopped two days ago. Spasms were present on exam L>R. No myoclonic jerking. Subjective grip strength decreased; however, patient able to hold on to side rales and hold herself up without difficulty. DDx include electrolyte derangement from ESRD vs advanced DM neuropathy vs Gabapentin withdrawal vs non ketotic hyperglycemia. Will continue to monitor and stop centrally acting medications while HHS resolves. Patient has not had a nerve study; will consider outpatient neurology follow up.  - Hold centrally acting medications - Monitor for resolution as HHS resolves  Mild transaminits Increased Alk Phos Progressive increased alk phos likely in the setting of ESRD and disturbances in bone mineralization. Upon evaluation GGT is elevated at 271, expanding differential diagnosis to hepatobiliary origin.  Patient currently denies abdominal pain. Consider RUQ ultrasound during this admission to further work up if patient becomes symptomatic during this hospitalization -Continue to monitor  Anemia ?Iron deficiency Likely in the setting of ESRD. Patient noted to have Hgb 9.4 on 9/22 at last HD session and note mentions she will be started on ESA. Patient's last iron panel in our record was 8 months ago; 9/24 Ferritin >1000 and saturation ratio 49%, patient does not need IV iron replacement at this time.  -ESA at HD per nephrology -Continue to monitor H/H  HTN Hypertensive since with SBPs  in upper 160s.  -Start home amlodipine 10 mg daily after breakfast -Continue to hold home labetalol 100 mg BID -Continue to monitor VS  Oropharyngeal candidiasis Mouth thrush present. Patient was previously on 100 mg diflucan.   Hypothyroidism Currently on Levothyroxine 25 mcg. Last TSH on record on 03/2021 -TSH check, pending -Continue to hold home Levothyroxine  Chronic pain syndrome Denies pain at this time. On oxycodone-acetaminophen 10-325 mg q4HR and methocarbomol 750 mg q8HR. Patient is followed by Simona Huh and Kristie [facility] at [facility]. Last refill on the 19th of September. Unable to see notes -Continue to hold  Hyperlipidemia Chronic, no labs on file. Patient was discharged on Crestor 20 mg. Not on home meds list. Consider monitoring during this hospitalization  Encephalopathy, resolved Metabolic vs toxic ESRD patient with polypharmacy and centrally acting medications and hyperglycemia. Continue to hold gabapentin and Percocet. Patient alert x4 during this interview - Continue to monitor BMP - Hold gabapentin and home percocet  Diet: NPO VTE: Heparin IVF: 125cc/hr LR until BG <250 Code: Full  Prior to Admission Living Arrangement: Home, living family Anticipated Discharge Location: Home Barriers to Discharge: Clinical improvement  Dispo: Admit patient to Inpatient with  expected length of stay greater than 2 midnights.  Signed: Romana Juniper, MD Internal Medicine Resident PGY-1  02/21/2022, 11:22 PM

## 2022-02-21 NOTE — ED Notes (Signed)
In and Out cath performed with myself and NT.

## 2022-02-22 ENCOUNTER — Inpatient Hospital Stay (HOSPITAL_COMMUNITY): Payer: Medicare Other

## 2022-02-22 DIAGNOSIS — Z794 Long term (current) use of insulin: Secondary | ICD-10-CM

## 2022-02-22 DIAGNOSIS — E1122 Type 2 diabetes mellitus with diabetic chronic kidney disease: Secondary | ICD-10-CM

## 2022-02-22 DIAGNOSIS — N186 End stage renal disease: Secondary | ICD-10-CM

## 2022-02-22 DIAGNOSIS — I120 Hypertensive chronic kidney disease with stage 5 chronic kidney disease or end stage renal disease: Secondary | ICD-10-CM | POA: Diagnosis not present

## 2022-02-22 DIAGNOSIS — F17210 Nicotine dependence, cigarettes, uncomplicated: Secondary | ICD-10-CM

## 2022-02-22 DIAGNOSIS — Z992 Dependence on renal dialysis: Secondary | ICD-10-CM

## 2022-02-22 DIAGNOSIS — E1100 Type 2 diabetes mellitus with hyperosmolarity without nonketotic hyperglycemic-hyperosmolar coma (NKHHC): Secondary | ICD-10-CM | POA: Diagnosis not present

## 2022-02-22 LAB — HEPATITIS B SURFACE ANTIBODY,QUALITATIVE: Hep B S Ab: NONREACTIVE

## 2022-02-22 LAB — GLUCOSE, CAPILLARY
Glucose-Capillary: 48 mg/dL — ABNORMAL LOW (ref 70–99)
Glucose-Capillary: 87 mg/dL (ref 70–99)

## 2022-02-22 LAB — RENAL FUNCTION PANEL
Albumin: 2.5 g/dL — ABNORMAL LOW (ref 3.5–5.0)
Anion gap: 11 (ref 5–15)
BUN: 34 mg/dL — ABNORMAL HIGH (ref 6–20)
CO2: 25 mmol/L (ref 22–32)
Calcium: 8.2 mg/dL — ABNORMAL LOW (ref 8.9–10.3)
Chloride: 94 mmol/L — ABNORMAL LOW (ref 98–111)
Creatinine, Ser: 4.34 mg/dL — ABNORMAL HIGH (ref 0.44–1.00)
GFR, Estimated: 13 mL/min — ABNORMAL LOW (ref >60)
Glucose, Bld: 70 mg/dL (ref 70–99)
Phosphorus: 3 mg/dL (ref 2.5–4.6)
Potassium: 4.1 mmol/L (ref 3.5–5.1)
Sodium: 130 mmol/L — ABNORMAL LOW (ref 135–145)

## 2022-02-22 LAB — BASIC METABOLIC PANEL
Anion gap: 14 (ref 5–15)
Anion gap: 11 (ref 5–15)
Anion gap: 11 (ref 5–15)
BUN: 34 mg/dL — ABNORMAL HIGH (ref 6–20)
BUN: 34 mg/dL — ABNORMAL HIGH (ref 6–20)
BUN: 28 mg/dL — ABNORMAL HIGH (ref 6–20)
CO2: 26 mmol/L (ref 22–32)
CO2: 27 mmol/L (ref 22–32)
CO2: 29 mmol/L (ref 22–32)
Calcium: 8.5 mg/dL — ABNORMAL LOW (ref 8.9–10.3)
Calcium: 8.3 mg/dL — ABNORMAL LOW (ref 8.9–10.3)
Calcium: 8.3 mg/dL — ABNORMAL LOW (ref 8.9–10.3)
Chloride: 89 mmol/L — ABNORMAL LOW (ref 98–111)
Chloride: 91 mmol/L — ABNORMAL LOW (ref 98–111)
Chloride: 91 mmol/L — ABNORMAL LOW (ref 98–111)
Creatinine, Ser: 4.38 mg/dL — ABNORMAL HIGH (ref 0.44–1.00)
Creatinine, Ser: 4.42 mg/dL — ABNORMAL HIGH (ref 0.44–1.00)
Creatinine, Ser: 3.82 mg/dL — ABNORMAL HIGH (ref 0.44–1.00)
GFR, Estimated: 13 mL/min — ABNORMAL LOW (ref >60)
GFR, Estimated: 13 mL/min — ABNORMAL LOW (ref >60)
GFR, Estimated: 15 mL/min — ABNORMAL LOW (ref >60)
Glucose, Bld: 301 mg/dL — ABNORMAL HIGH (ref 70–99)
Glucose, Bld: 73 mg/dL (ref 70–99)
Glucose, Bld: 100 mg/dL — ABNORMAL HIGH (ref 70–99)
Potassium: 3.8 mmol/L (ref 3.5–5.1)
Potassium: 4.3 mmol/L (ref 3.5–5.1)
Potassium: 4.6 mmol/L (ref 3.5–5.1)
Sodium: 129 mmol/L — ABNORMAL LOW (ref 135–145)
Sodium: 129 mmol/L — ABNORMAL LOW (ref 135–145)
Sodium: 131 mmol/L — ABNORMAL LOW (ref 135–145)

## 2022-02-22 LAB — CBG MONITORING, ED
Glucose-Capillary: 109 mg/dL — ABNORMAL HIGH (ref 70–99)
Glucose-Capillary: 118 mg/dL — ABNORMAL HIGH (ref 70–99)
Glucose-Capillary: 193 mg/dL — ABNORMAL HIGH (ref 70–99)
Glucose-Capillary: 318 mg/dL — ABNORMAL HIGH (ref 70–99)
Glucose-Capillary: 437 mg/dL — ABNORMAL HIGH (ref 70–99)
Glucose-Capillary: 52 mg/dL — ABNORMAL LOW (ref 70–99)
Glucose-Capillary: 61 mg/dL — ABNORMAL LOW (ref 70–99)
Glucose-Capillary: 63 mg/dL — ABNORMAL LOW (ref 70–99)
Glucose-Capillary: 63 mg/dL — ABNORMAL LOW (ref 70–99)
Glucose-Capillary: 86 mg/dL (ref 70–99)

## 2022-02-22 LAB — HEPATIC FUNCTION PANEL
ALT: 32 U/L (ref 0–44)
AST: 102 U/L — ABNORMAL HIGH (ref 15–41)
Albumin: 2.5 g/dL — ABNORMAL LOW (ref 3.5–5.0)
Alkaline Phosphatase: 234 U/L — ABNORMAL HIGH (ref 38–126)
Bilirubin, Direct: 0.6 mg/dL — ABNORMAL HIGH (ref 0.0–0.2)
Indirect Bilirubin: 0.6 mg/dL (ref 0.3–0.9)
Total Bilirubin: 1.2 mg/dL (ref 0.3–1.2)
Total Protein: 5.2 g/dL — ABNORMAL LOW (ref 6.5–8.1)

## 2022-02-22 LAB — CBC
HCT: 31.3 % — ABNORMAL LOW (ref 36.0–46.0)
Hemoglobin: 10.2 g/dL — ABNORMAL LOW (ref 12.0–15.0)
MCH: 28.6 pg (ref 26.0–34.0)
MCHC: 32.6 g/dL (ref 30.0–36.0)
MCV: 87.7 fL (ref 80.0–100.0)
Platelets: 215 10*3/uL (ref 150–400)
RBC: 3.57 MIL/uL — ABNORMAL LOW (ref 3.87–5.11)
RDW: 14 % (ref 11.5–15.5)
WBC: 5.2 10*3/uL (ref 4.0–10.5)
nRBC: 0.4 % — ABNORMAL HIGH (ref 0.0–0.2)

## 2022-02-22 LAB — IRON AND TIBC
Iron: 79 ug/dL (ref 28–170)
Saturation Ratios: 49 % — ABNORMAL HIGH (ref 10.4–31.8)
TIBC: 162 ug/dL — ABNORMAL LOW (ref 250–450)
UIBC: 83 ug/dL

## 2022-02-22 LAB — HEPATITIS B CORE ANTIBODY, TOTAL: Hep B Core Total Ab: NONREACTIVE

## 2022-02-22 LAB — GAMMA GT: GGT: 271 U/L — ABNORMAL HIGH (ref 7–50)

## 2022-02-22 LAB — OSMOLALITY
Osmolality: 319 mOsm/kg — ABNORMAL HIGH (ref 275–295)
Osmolality: 289 mOsm/kg (ref 275–295)

## 2022-02-22 LAB — FERRITIN: Ferritin: 1453 ng/mL — ABNORMAL HIGH (ref 11–307)

## 2022-02-22 LAB — TSH: TSH: 2.08 u[IU]/mL (ref 0.350–4.500)

## 2022-02-22 LAB — HEPATITIS C ANTIBODY: HCV Ab: NONREACTIVE

## 2022-02-22 MED ORDER — INSULIN REGULAR(HUMAN) IN NACL 100-0.9 UT/100ML-% IV SOLN: INTRAVENOUS | Status: DC

## 2022-02-22 MED ORDER — NYSTATIN 100000 UNIT/ML MT SUSP
5.0000 mL | Freq: Four times a day (QID) | OROMUCOSAL | Status: DC
  Administered 2022-02-22 – 2022-02-24 (×2): 500000 [IU] via ORAL
  Filled 2022-02-22 (×3): qty 5

## 2022-02-22 MED ORDER — DIPHENHYDRAMINE HCL 25 MG PO CAPS
ORAL_CAPSULE | ORAL | Status: AC
  Filled 2022-02-22: qty 1

## 2022-02-22 MED ORDER — AMLODIPINE BESYLATE 10 MG PO TABS: 10.0000 mg | ORAL_TABLET | Freq: Every day | ORAL | Status: DC

## 2022-02-22 MED ORDER — PROSOURCE PLUS PO LIQD: 30.0000 mL | Freq: Two times a day (BID) | ORAL | Status: DC

## 2022-02-22 MED ORDER — DOXERCALCIFEROL 4 MCG/2ML IV SOLN
1.0000 ug | INTRAVENOUS | Status: DC
  Administered 2022-02-24: 1 ug via INTRAVENOUS
  Filled 2022-02-22 (×2): qty 2

## 2022-02-22 MED ORDER — INSULIN ASPART 100 UNIT/ML IJ SOLN: 4.0000 [IU] | Freq: Three times a day (TID) | INTRAMUSCULAR | Status: DC

## 2022-02-22 MED ORDER — MAGNESIUM SULFATE 4 GM/100ML IV SOLN
4.0000 g | Freq: Once | INTRAVENOUS | Status: AC
  Administered 2022-02-22: 4 g via INTRAVENOUS
  Filled 2022-02-22: qty 100

## 2022-02-22 MED ORDER — INSULIN GLARGINE-YFGN 100 UNIT/ML ~~LOC~~ SOLN: 7.0000 [IU] | Freq: Every day | SUBCUTANEOUS | Status: DC

## 2022-02-22 MED ORDER — INSULIN GLARGINE-YFGN 100 UNIT/ML ~~LOC~~ SOLN
5.0000 [IU] | Freq: Every day | SUBCUTANEOUS | Status: DC
  Administered 2022-02-23: 5 [IU] via SUBCUTANEOUS
  Filled 2022-02-22 (×2): qty 0.05

## 2022-02-22 MED ORDER — LOPERAMIDE HCL 2 MG PO CAPS: 2.0000 mg | ORAL_CAPSULE | ORAL | Status: DC | PRN

## 2022-02-22 MED ORDER — HEPARIN SODIUM (PORCINE) 1000 UNIT/ML DIALYSIS
2500.0000 [IU] | Freq: Once | INTRAMUSCULAR | Status: AC
  Administered 2022-02-22: 2500 [IU] via INTRAVENOUS_CENTRAL
  Filled 2022-02-22 (×2): qty 3

## 2022-02-22 MED ORDER — INSULIN ASPART 100 UNIT/ML IJ SOLN: 4.0000 [IU] | Freq: Once | INTRAMUSCULAR | Status: DC

## 2022-02-22 MED ORDER — INSULIN REGULAR(HUMAN) IN NACL 100-0.9 UT/100ML-% IV SOLN
INTRAVENOUS | Status: DC
  Administered 2022-02-22: 1 [IU]/h via INTRAVENOUS

## 2022-02-22 MED ORDER — HEPARIN SODIUM (PORCINE) 1000 UNIT/ML DIALYSIS
1000.0000 [IU] | INTRAMUSCULAR | Status: DC | PRN
  Filled 2022-02-22: qty 1

## 2022-02-22 MED ORDER — LEVOTHYROXINE SODIUM 25 MCG PO TABS
25.0000 ug | ORAL_TABLET | Freq: Every day | ORAL | Status: DC
  Administered 2022-02-23: 25 ug via ORAL
  Filled 2022-02-22 (×2): qty 1

## 2022-02-22 MED ORDER — DIPHENHYDRAMINE HCL 25 MG PO CAPS
25.0000 mg | ORAL_CAPSULE | Freq: Once | ORAL | Status: AC
  Administered 2022-02-22: 25 mg via ORAL

## 2022-02-22 MED ORDER — INSULIN ASPART 100 UNIT/ML IJ SOLN
0.0000 [IU] | Freq: Three times a day (TID) | INTRAMUSCULAR | Status: DC
  Administered 2022-02-23: 3 [IU] via SUBCUTANEOUS
  Administered 2022-02-24: 1 [IU] via SUBCUTANEOUS

## 2022-02-22 MED ORDER — CHLORHEXIDINE GLUCONATE CLOTH 2 % EX PADS
6.0000 | MEDICATED_PAD | Freq: Every day | CUTANEOUS | Status: DC
  Administered 2022-02-23: 6 via TOPICAL

## 2022-02-22 MED ORDER — NEPRO/CARBSTEADY PO LIQD
237.0000 mL | Freq: Two times a day (BID) | ORAL | Status: DC
  Administered 2022-02-24: 237 mL via ORAL

## 2022-02-22 MED ORDER — LIDOCAINE HCL (PF) 1 % IJ SOLN: 5.0000 mL | INTRAMUSCULAR | Status: DC | PRN

## 2022-02-22 MED ORDER — INSULIN GLARGINE-YFGN 100 UNIT/ML ~~LOC~~ SOLN
10.0000 [IU] | Freq: Every day | SUBCUTANEOUS | Status: DC
  Administered 2022-02-22: 10 [IU] via SUBCUTANEOUS
  Filled 2022-02-22: qty 0.1

## 2022-02-22 MED ORDER — PHENYLEPHRINE-MINERAL OIL-PET 0.25-14-74.9 % RE OINT
1.0000 | TOPICAL_OINTMENT | Freq: Two times a day (BID) | RECTAL | Status: DC | PRN
  Administered 2022-02-22: 1 via RECTAL
  Filled 2022-02-22: qty 57

## 2022-02-22 MED ORDER — LIDOCAINE-PRILOCAINE 2.5-2.5 % EX CREA: 1.0000 | TOPICAL_CREAM | CUTANEOUS | Status: DC | PRN

## 2022-02-22 MED ORDER — DARBEPOETIN ALFA 40 MCG/0.4ML IJ SOSY
40.0000 ug | PREFILLED_SYRINGE | INTRAMUSCULAR | Status: DC
  Administered 2022-02-22: 40 ug via INTRAVENOUS
  Filled 2022-02-22: qty 0.4

## 2022-02-22 MED ORDER — PENTAFLUOROPROP-TETRAFLUOROETH EX AERO: 1.0000 | INHALATION_SPRAY | CUTANEOUS | Status: DC | PRN

## 2022-02-22 NOTE — Progress Notes (Signed)
Received patient in bed to unit.  Alert and oriented.  Informed consent signed and in chart.   Treatment initiated: 1356 Treatment completed: 1816  Patient tolerated well.  Transported back to the room  Alert, without acute distress.  Hand-off given to patient's nurse.   Access used: Avfistula  Access issues: none  Total UF removed: 2L Medication(s) given: Aranesp Post HD VS: 154/98,92,14,97.8 Post HD weight: Unable to weigh, ED gurney.   [facility]

## 2022-02-22 NOTE — Inpatient Diabetes Management (Addendum)
Inpatient Diabetes Program Recommendations  AACE/ADA: New Consensus Statement on Inpatient Glycemic Control (2015)  Target Ranges:  Prepandial:   less than 140 mg/dL      Peak postprandial:   less than 180 mg/dL (1-2 hours)      Critically ill patients:  140 - 180 mg/dL   Lab Results  Component Value Date   GLUCAP 118 (H) 02/22/2022   HGBA1C 14.3 (H) 02/08/2022    Review of Glycemic Control  Diabetes history: DM type 1 (requires basal bolus insulin for correction and meal coverage) Outpatient Diabetes medications: Lantus 4 units qhs, Humalog 1-6 units tid Current orders for Inpatient glycemic control:  IV insulin transitioning to: Semglee 10 units Daily Novolog 0-6 units tid  Creat/BUN: 4.3/34 Presented with glucose 1,179 A1c 14.3% on 9/11  Hypoglycemia on IV insulin/elevated renal function Pt frequently here with hyperglycemia  Inpatient Diabetes Program Recommendations:    Last admission pt was on lower dose of basal insulin  -  May need to reduce Semglee to 5 units  Spoke with pt at bedside regarding A1c level of 14.3% and CGM use at home. Pt reports not eating at home and having experiences in the past where she had hypoglycemia with her insulin doses. Pt reports not taking basal insulin approx 3 times a week. Pt reports that her PCP has referred her to an Endocrinologist but her appointment is for December. Pt was scheduled to see one earlier this year but the MD left the practice and she had to start her search over in finding a specialist. Pt explained that she drank a lot of orange juice prior to coming in leading to her glucose levels being so high. Discussed importance of glucose control. Discussed freestyle Libre 3 and gave pt information on the sensor. Gave pt a Libre 3 and will assist pt in applying after HD.   Thanks,  Tama Headings RN, MSN, BC-ADM Inpatient Diabetes Coordinator Team Pager [phone] (8a-5p)

## 2022-02-22 NOTE — Consult Note (Signed)
[facility] Renal Consultation Note    Indication for Consultation:  Management of ESRD/hemodialysis; anemia, hypertension/volume and secondary hyperparathyroidism PCP: [facility]  HPI: Ann Robertson is a 31 y.o. female with ESRD on dialysis MWF at [facility]. PMH: DMT1 poorly controlled, STD, HSV-2, SHPT, Anemia. Recent admission 09/11-09/14/2023 for DKA VS HHS. BS > 700. Last HD 02/19/2022 truncated treatment-ran 2 hours 55 minutes, left   Patient presented to ED 02/21/2022 with C/O leg spasm. Upon arrival to ED, she was noted to have BS 1179, pseudohyponatremia. She was started on insulin gtt and admitted for HHS. She in HD. Last BS 63. She has no complaints but says she has dialyzer allergy and is concerned about itching on dialysis. This is a different system than OP center, should not be an issue however given benadryl prior to HD. Of note, Na+remains in 130 range with normalization of BS. She is currently + 2175 cc per I & O.  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   Neuropathy    Preeclampsia    STD (sexually transmitted disease)    Past Surgical History:  Procedure Laterality Date   AV FISTULA PLACEMENT Left 06/19/2021   Procedure: INSERTION OF ARTERIOVENOUS (AV) GORE-TEX GRAFT ARM;  Surgeon: Angelia Mould, MD;  Location: [facility];  Service: Vascular;  Laterality: Left;   [place] N/A 07/05/2013   Procedure: CESAREAN SECTION;  Surgeon: Emily Filbert, MD;  Location: [facility];  Service: Obstetrics;  Laterality: N/A;   CESAREAN SECTION N/A 07/25/2014   Procedure: CESAREAN SECTION;  Surgeon: Donnamae Jude, MD;  Location: [facility];  Service: Obstetrics;  Laterality: N/A;   CESAREAN SECTION N/A 11/21/2015   Procedure: CESAREAN SECTION;  Surgeon: Donnamae Jude, MD;  Location: [facility];  Service: Obstetrics;  Laterality: N/A;   IR FLUORO GUIDE  CV LINE RIGHT  06/16/2021   IR US GUIDE VASC ACCESS RIGHT  06/16/2021   WISDOM TOOTH EXTRACTION     Family History  Problem Relation Age of Onset   Stroke Mother    Hypertension Mother    Heart disease Mother    Kidney disease Mother    Hyperlipidemia Father    Diabetes Father    Social History:  reports that she has quit smoking. Her smoking use included cigarettes. She has a 2.25 pack-year smoking history. She has never used smokeless tobacco. She reports that she does not currently use alcohol. She reports that she does not use drugs. Allergies  Allergen Reactions   Hydromorphone Hcl Itching and Rash   Prior to Admission medications   Medication Sig Start Date End Date Taking? Authorizing Provider  fluconazole (DIFLUCAN) 100 MG tablet Take 100 mg by mouth daily. 12/14/21  Yes [provider]  gabapentin (NEURONTIN) 100 MG capsule Take 100 mg by mouth daily. 02/15/22  Yes [provider]  insulin glargine (LANTUS SOLOSTAR) 100 UNIT/ML Solostar Pen Inject 3 Units into the skin daily. Inject 2-5 units as needed for High glucose levels Patient taking differently: Inject 4 Units into the skin at bedtime. Inject 2-5 units as needed for High glucose levels 02/11/22  Yes Delene Ruffini, MD  insulin lispro (HUMALOG) 100 UNIT/ML KwikPen Inject 1-6 Units into the skin See admin instructions. Inject 1-6 units up to three times daily with meals (only if eating) per sliding scale based on CBG and  Carb count   Yes [provider]  labetalol (NORMODYNE) 100 MG tablet Take 100 mg by mouth 2 (two) times daily. 03/26/21  Yes [provider]  lidocaine-prilocaine (EMLA) cream Apply 1 Application topically as needed (For port access). 11/17/21  Yes [provider]  methocarbamol (ROBAXIN) 750 MG tablet Take 750 mg by mouth 3 (three) times daily. 02/16/22  Yes [provider]  omeprazole (PRILOSEC) 20 MG capsule Take 20 mg by mouth daily as needed (acid  reflux/heartburn).   Yes [provider]  oxyCODONE-acetaminophen (PERCOCET) 10-325 MG tablet Take 1 tablet by mouth every 4 (four) hours as needed for pain. 02/13/21  Yes [provider]  benzocaine (ORAJEL) 10 % mucosal gel Use as directed in the mouth or throat 2 (two) times daily as needed for mouth pain. Patient not taking: Reported on 02/21/2022 02/11/22   Delene Ruffini, MD  ondansetron (ZOFRAN) 4 MG tablet Take 1 tablet (4 mg total) by mouth every 8 (eight) hours as needed for nausea or vomiting. Patient not taking: Reported on 02/21/2022 06/10/20   Renato Shin, MD   Current Facility-Administered Medications  Medication Dose Route Frequency Provider Last Rate Last Admin   amLODipine (NORVASC) tablet 10 mg  10 mg Oral Daily Masters, Katie, DO       Chlorhexidine Gluconate Cloth 2 % PADS 6 each  6 each Topical Q0600 Valentina Gu, NP       dextrose 50 % solution 0-50 mL  0-50 mL Intravenous PRN Domenic Moras, PA-C   20 mL at 02/22/22 1315   diphenhydrAMINE (BENADRYL) capsule 25 mg  25 mg Oral Once Valentina Gu, NP       heparin injection 1,000 Units  1,000 Units Intracatheter PRN Valentina Gu, NP       heparin injection 5,000 Units  5,000 Units Subcutaneous Q8H Katsadouros, Vasilios, MD       insulin aspart (novoLOG) injection 0-6 Units  0-6 Units Subcutaneous TID WC Masters, Katie, DO       [START ON 02/23/2022] insulin glargine-yfgn (SEMGLEE) injection 7 Units  7 Units Subcutaneous Daily Linward Natal, MD       insulin regular, human (MYXREDLIN) 100 units/ 100 mL infusion   Intravenous Continuous Masters, Katie, DO   Stopped at 02/22/22 0370   levothyroxine (SYNTHROID) tablet 25 mcg  25 mcg Oral Q0600 Masters, Katie, DO       lidocaine (PF) (XYLOCAINE) 1 % injection 5 mL  5 mL Intradermal PRN Valentina Gu, NP       lidocaine-prilocaine (EMLA) cream 1 Application  1 Application Topical PRN Valentina Gu, NP       loperamide (IMODIUM)  capsule 2 mg  2 mg Oral PRN Linward Natal, MD       nystatin (MYCOSTATIN) 100000 UNIT/ML suspension 500,000 Units  5 mL Oral QID Linward Natal, MD       pentafluoroprop-tetrafluoroeth (GEBAUERS) aerosol 1 Application  1 Application Topical PRN Valentina Gu, NP       Labs: Basic Metabolic Panel: Recent Labs  Lab 02/21/22 1910 02/21/22 2043 02/22/22 0148 02/22/22 1235  NA 123*  --  129* 130*  129*  K 4.9  --  3.8 4.1  4.3  CL 85*  --  89* 94*  91*  CO2 21*  --  26 25  27   GLUCOSE 962*  --  301* 70  73  BUN 35*  --  34* 34*  34*  CREATININE 4.37*  --  4.38* 4.34*  4.42*  CALCIUM 8.1*  --  8.5* 8.2*  8.3*  PHOS  --  4.6  --  3.0   Liver Function Tests: Recent Labs  Lab 02/21/22 1611 02/22/22 1235  AST 50*  --   ALT 27  --   ALKPHOS 242*  --   BILITOT 1.1  --   PROT 5.9*  --   ALBUMIN 2.7* 2.5*   Recent Labs  Lab 02/21/22 1611  LIPASE 24   No results for input(s): "AMMONIA" in the last 168 hours. CBC: Recent Labs  Lab 02/21/22 1611 02/21/22 1639 02/22/22 1235  WBC 4.6  --  5.2  NEUTROABS 3.3  --   --   HGB 8.9* 11.6* 10.2*  HCT 29.3* 34.0* 31.3*  MCV 96.1  --  87.7  PLT 201  --  215   Cardiac Enzymes: No results for input(s): "CKTOTAL", "CKMB", "CKMBINDEX", "TROPONINI" in the last 168 hours. CBG: Recent Labs  Lab 02/22/22 0732 02/22/22 0823 02/22/22 0941 02/22/22 1153 02/22/22 1304  GLUCAP 63* 61* 118* 86 63*   Iron Studies:  Recent Labs    02/22/22 0148  IRON 79  TIBC 162*  FERRITIN 1,453*   Studies/Results: US Abdomen Limited RUQ (LIVER/GB)  Result Date: 02/22/2022 CLINICAL DATA:  Transaminitis. EXAM: ULTRASOUND ABDOMEN LIMITED RIGHT UPPER QUADRANT COMPARISON:  April 19, 2021. FINDINGS: Gallbladder: No gallstones or wall thickening visualized. No sonographic Murphy sign noted by sonographer. Common bile duct: Diameter: 3 mm which is within normal limits. Liver: No focal lesion identified. Slightly nodular hepatic margins  are noted suggesting cirrhosis. Portal vein is patent on color Doppler imaging with normal direction of blood flow towards the liver. Other: Mild ascites is noted around the liver. IMPRESSION: Slightly nodular hepatic margins are noted suggesting hepatic cirrhosis. Mild ascites is noted. Electronically Signed   By: Marijo Conception M.D.   On: 02/22/2022 11:37    ROS: As per HPI otherwise negative.  Review of Systems: Gen: Denies any fever, chills, sweats, anorexia, fatigue, weakness, malaise, weight loss, and sleep disorder HEENT: No visual complaints, No history of Retinopathy. Normal external appearance No Epistaxis or Sore throat. No sinusitis.   CV: Denies chest pain, angina, palpitations, syncope, orthopnea, PND, peripheral edema, and claudication. Resp: Denies dyspnea at rest, dyspnea with exercise, cough, sputum, wheezing, coughing up blood, and pleurisy. GI: Denies vomiting blood, jaundice, and fecal incontinence.   Denies dysphagia or odynophagia. GU : Denies urinary burning, blood in urine, urinary frequency, urinary hesitancy, nocturnal urination, and urinary incontinence.  No renal calculi. MS: Denies joint pain, limitation of movement, and swelling, stiffness, low back pain, extremity pain. Denies muscle weakness, cramps, atrophy.  No use of non steroidal antiinflammatory drugs. Derm: Denies rash, itching, dry skin, hives, moles, warts, or unhealing ulcers.  Psych: Denies depression, anxiety, memory loss, suicidal ideation, hallucinations, paranoia, and confusion. Heme: Denies bruising, bleeding, and enlarged lymph nodes. Neuro: No headache.  No diplopia. No dysarthria.  No dysphasia.  No history of CVA.  No Seizures. No paresthesias.  No weakness. Endocrine No DM.  No Thyroid disease.  No Adrenal disease.  Physical Exam: Vitals:   02/22/22 1331 02/22/22 1350 02/22/22 1356 02/22/22 1400  BP: (!) 157/97 (!) 165/89 132/85 132/85  Pulse: 88 92 88 90  Resp: 17 15 13 14   Temp: 98 F  (36.7 C)     TempSrc: Oral     SpO2: 100% 100% 100% 99%  Weight:      Height:  General: Chronically ill appearing female who looks older than stated age.  Head: Normocephalic, atraumatic, sclera non-icteric, mucus membranes are moist Neck: Supple. JVD not elevated. Lungs: Clear bilaterally to auscultation without wheezes, rales, or rhonchi. Breathing is unlabored. Heart: RRR with S1 S2. No murmurs, rubs, or gallops appreciated. Abdomen: Soft, non-tender, non-distended with normoactive bowel sounds. No rebound/guarding. No obvious abdominal masses. M-S:  Strength and tone appear normal for age. Lower extremities:without edema or ischemic changes, no open wounds  Neuro: Alert and oriented X 3. Moves all extremities spontaneously. Psych:  Responds to questions appropriately with a normal affect. Dialysis Access: L AVG + T/B  Dialysis Orders: [facility] M[facility] 4 hr 180NRe 400/500 50.8 kg 2.0 K/2.0 Ca AVG -Heparin 2500 units IV TIW -Hectorol 1 mcg IV TIW - Mircera 50 mcg IV q 2 weeks (last dose 75 mcg IV 01/25/2022) - Venofer 50 mg IV weekly   Assessment/Plan:  HHS-per primary. BS now controlled. Management per primary.  Upper extremity spasms-W/U per primary  Mild transaminitis-per primary  ESRD -  MWF HD today on schedule  Hypertension/volume  - No excess volume per exam but Na remains 130 after BS correction. I & O positive over 2 liters. Wt 53 kgs. UF as tolerated.   Anemia  - HGB 10.2. No recent ESA doses at OP center. Give Aranesp 40 mcg IV today.   Metabolic bone disease -  OP labs at goal. Continue velphoro binders, VDRA  Nutrition - Albumin 2.5. Add protein supplements.   Quenisha Lovins H. Owens Shark, NP-C 02/22/2022, 2:32 PM  [employer] [phone]

## 2022-02-22 NOTE — ED Notes (Signed)
Patient reported that she felt like her blood sugar was low. RN checked and it is 12. Patient due to go to dialysis so RN attempted to administer d50, 72mL. Patient unable to tolerate D50 so only 30mL administered. Patient provided OJ and reports feeling better. Patient transported to dialysis at this time on tele-monitoring.

## 2022-02-22 NOTE — Progress Notes (Signed)
Pt receives out-pt HD at [facility] on MWF. Pt has a 9:40 chair time. Will assist as needed.  Melven Sartorius Renal Navigator [phone]

## 2022-02-22 NOTE — ED Notes (Addendum)
Per Masters DO, pt cleared to eat. Pt given bagged lunch and water

## 2022-02-22 NOTE — ED Notes (Signed)
Patient CBG 62, patient provided juice to drink.

## 2022-02-22 NOTE — Procedures (Signed)
Patient seen and examined on Hemodialysis. BP 128/81 (BP Location: Right Arm)   Pulse 86   Temp 98 F (36.7 C) (Oral)   Resp 11   Ht 5\' 5"  (1.651 m)   Wt 53 kg   SpO2 99%   BMI 19.44 kg/m   QB 400 mL/ min via AVF, UF goal 4L  Tolerating treatment without complaints at this time. Reports some pain in the access.   Madelon Lips MD [facility] [phone] 3:23 PM

## 2022-02-22 NOTE — ED Notes (Signed)
Patient CBG 62, patient's breakfast arrived and she is eating at this time. Will monitor CBG to ensure correction.

## 2022-02-22 NOTE — Progress Notes (Signed)
HD#1 Subjective:   Summary: Ann Robertson is a 31yo F with a PMHx of uncontrolled T1DM, ESRD on dialysis, M/W/F, HLD, and L arm spasms, who presented to the [facility] with worsening L arm spasms and high blood sugars  Overnight Events: NAEO.   Patient endorses chronic diarrhea. She takes imodium for this at home but she does not feel this is adequate. She states she has not started her course of diflucan for her thrush. She states she does not like taking her blood sugar at home.  Objective:  Vital signs in last 24 hours: Vitals:   02/22/22 1331 02/22/22 1350 02/22/22 1356 02/22/22 1400  BP: (!) 157/97 (!) 165/89 132/85 132/85  Pulse: 88 92 88 90  Resp: _0 Temp: 98 F (36.7 C)     TempSrc: Oral     SpO2: 100% 100% 100% 99%  Weight:      Height:       Supplemental O2: Room Air SpO2: 99 %   Physical Exam:  Constitutional: well-appearing female sitting in hospital bed, in no acute distress HENT: normocephalic atraumatic, mucous membranes moist Eyes: conjunctiva non-erythematous Neck: supple Cardiovascular: regular rate and rhythm, no m/r/g Pulmonary/Chest: normal work of breathing on room air Abdominal: soft, non-tender, non-distended. RUQ without pain on palpation. MSK: normal bulk and tone Neurological: alert & oriented x 3, 5/5 strength in bilateral upper and lower extremities, normal gait Skin: warm and dry Psych: Patient noticeably sad, crying during rounds when discussing diarrhea.  Filed Weights   02/21/22 1903  Weight: 53 kg     Intake/Output Summary (Last 24 hours) at 02/22/2022 1408 Last data filed at 02/22/2022 1583 Gross per 24 hour  Intake 2175.61 ml  Output --  Net 2175.61 ml   Net IO Since Admission: 2,175.61 mL [02/22/22 1408]  Pertinent Labs:    Latest Ref Rng & Units 02/22/2022   12:35 PM 02/21/2022    4:39 PM 02/21/2022    4:11 PM  CBC  WBC 4.0 - 10.5 K/uL 5.2   4.6   Hemoglobin 12.0 - 15.0 g/dL 10.2  11.6  8.9   Hematocrit  36.0 - 46.0 % 31.3  34.0  29.3   Platelets 150 - 400 K/uL 215   201        Latest Ref Rng & Units 02/22/2022   12:35 PM 02/22/2022    1:48 AM 02/21/2022    7:10 PM  CMP  Glucose 70 - 99 mg/dL 70 - 99 mg/dL 73    70  301  962   BUN 6 - 20 mg/dL 6 - 20 mg/dL 34    34  34  35   Creatinine 0.44 - 1.00 mg/dL 0.44 - 1.00 mg/dL 4.42    4.34  4.38  4.37   Sodium 135 - 145 mmol/L 135 - 145 mmol/L 129    130  129  123   Potassium 3.5 - 5.1 mmol/L 3.5 - 5.1 mmol/L 4.3    4.1  3.8  4.9   Chloride 98 - 111 mmol/L 98 - 111 mmol/L 91    94  89  85   CO2 22 - 32 mmol/L 22 - 32 mmol/L _1 Calcium 8.9 - 10.3 mg/dL 8.9 - 10.3 mg/dL 8.3    8.2  8.5  8.1     Imaging: US Abdomen Limited RUQ (LIVER/GB)  Result Date: 02/22/2022 CLINICAL DATA:  Transaminitis. EXAM:  ULTRASOUND ABDOMEN LIMITED RIGHT UPPER QUADRANT COMPARISON:  April 19, 2021. FINDINGS: Gallbladder: No gallstones or wall thickening visualized. No sonographic Murphy sign noted by sonographer. Common bile duct: Diameter: 3 mm which is within normal limits. Liver: No focal lesion identified. Slightly nodular hepatic margins are noted suggesting cirrhosis. Portal vein is patent on color Doppler imaging with normal direction of blood flow towards the liver. Other: Mild ascites is noted around the liver. IMPRESSION: Slightly nodular hepatic margins are noted suggesting hepatic cirrhosis. Mild ascites is noted. Electronically Signed   By: Marijo Conception M.D.   On: 02/22/2022 11:37    Assessment/Plan:   Principal Problem:   Hyperosmolar hyperglycemic state (HHS) ([place])   Patient Summary: Ann Robertson is a 32 y.o. person living with a history of HTN, uncontrolled type 1 diabetes mellitus, ESRD  on dialysis (M/W/F), L arm spasms who presented with worsening L arm spasms and hyperglycemia and admitted for  diabetic hyperglycemic hyperosmolar syndrome.   HHS Uncontrolled type 1 DM with  nephropathy Pseudohyponatremia Patient with blood glucose of 1179 on admission in the setting of uncontrolled type 1 diabetes.  Likely precipitated by over correcting low blood sugar yesterday.  Last A1c on 9/11 14.3. Placed on Endo tool without potassium repletion given ESRD.  Glucose decreased below 200, patient transitioned to Semglee 10 u and NovoLog 4 u TID with meals with sensitive SSI. Several low BG readings today, most recently 63. -cBG q4 hr -replete K if <3.0 -Decrease Semglee to 7 u . Continue NovoLog 4 units 3 times daily with meals and sensitive SSI. -received and educated on CGM   ESRD Patient with ESRD on HD M/W/F patient followed outpatient by Dr. Hollie Robertson at [facility].  LUE AV fistula since January 2023 for progressive CKD possibly secondary to uncontrolled T1DM and HTN but no biopsy on file.  Before admission, patient had last been dialyzed on 9/22. -Nephrology consulted to begin inpatient dialysis -Monitor electrolytes -Strict I/Os   Upper extremity spasms Patient presents with history of spasms most predominant at left upper extremity that had previously benefited from Lyrica.  Patient developed myoclonic jerking and was taken off Lyrica at last hospitalization.  She was restarted on gabapentin 100 mg 3 times daily after discharge but stopped this 2 days ago.  Exam on admission revealed spasms but no myoclonic jerking.  Subjective grip strength decreased however patient was able to hold onto bed rails to support her cell for lung exam.  DDx for her spasms includes electrolyte derangement from ESRD versus focal seizures versus dehydration vs acidosis. -Hold centrally acting medications -Consider outpatient neurology referral/nerve study at discharge   Mild transaminits Increased Alk Phos Elevated alk phos thought to be secondary to ESRD and disturbances in bone mineralization.  However GGT found to be elevated to 271 prompting consideration for hepatobiliary  etiology.  She currently denies abdominal pain.  RUQ [place] 9/25 with slightly nodular hepatic margins suggesting cirrhosis. -Continue to monitor   Anemia of chronic disease Patient presents with normocytic anemia the setting of ESRD.  History of hypoproliferative reticulocytes.  Recent dialysis note mentions starting ESA.  Hgb 8.9 on admission.  Ferritin greater than 1000, sat ratio 49%. -management per nephrology   HTN Patient has a history of hypertension treated with amlodipine 10 mg and labetalol 100 mg twice daily.  Pressures labile today with several readings normotensive.   -Continue to hold home labetalol 100 mg twice daily -Continue amlodipine 10 mg daily  Oropharyngeal candidiasis Rennie Hack plaques  on tongue surface at admission consistent with candidiasis.  Previously treated with Diflucan. -Nystatin suspension   Hypothyroidism On Synthroid 25 mcg at home.  Last TSH 4.19.  Repeat on admission 2.08. -Continue home Levothyroxine 25 mcg   Chronic pain syndrome Patient is on oxycodone-acetaminophen 10-325 mg every 4 hours and methocarbamol 750 mg every 8 hours.  She sees pain management at [facility].  Patient denies pain currently. -We will hold home pain medications for now   Hyperlipidemia Patient placed on Crestor 20 mg at last hospital discharge but this is not on her home med list.  No lipid panel on file. -Repeat lipid panel  Diarrhea patient has chronic diarrhea with incontinence.  She treats this with Imodium as needed at home. -Imodium -fecal elastase   Diet: NPO VTE: Heparin IVF: 125cc/hr LR until BG <250 Code: Full   Prior to Admission Living Arrangement: Home, living family Anticipated Discharge Location: Home Barriers to Discharge: Clinical improvement   Dispo: Admit patient to Inpatient with expected length of stay greater than 2 midnights.  Linward Natal MD Internal Medicine Resident PGY-1 Please contact the on call pager after 5 pm and on  weekends at [phone].

## 2022-02-23 ENCOUNTER — Encounter (HOSPITAL_COMMUNITY): Payer: Self-pay | Admitting: Internal Medicine

## 2022-02-23 DIAGNOSIS — E1100 Type 2 diabetes mellitus with hyperosmolarity without nonketotic hyperglycemic-hyperosmolar coma (NKHHC): Secondary | ICD-10-CM | POA: Diagnosis not present

## 2022-02-23 DIAGNOSIS — R768 Other specified abnormal immunological findings in serum: Secondary | ICD-10-CM | POA: Diagnosis not present

## 2022-02-23 LAB — LIPID PANEL
Cholesterol: 142 mg/dL (ref 0–200)
HDL: 67 mg/dL (ref >40)
LDL Cholesterol: 60 mg/dL (ref 0–99)
Total CHOL/HDL Ratio: 2.1 RATIO
Triglycerides: 76 mg/dL (ref <150)
VLDL: 15 mg/dL (ref 0–40)

## 2022-02-23 LAB — RENAL FUNCTION PANEL
Albumin: 2.3 g/dL — ABNORMAL LOW (ref 3.5–5.0)
Anion gap: 9 (ref 5–15)
BUN: 33 mg/dL — ABNORMAL HIGH (ref 6–20)
CO2: 24 mmol/L (ref 22–32)
Calcium: 7.2 mg/dL — ABNORMAL LOW (ref 8.9–10.3)
Chloride: 97 mmol/L — ABNORMAL LOW (ref 98–111)
Creatinine, Ser: 4.04 mg/dL — ABNORMAL HIGH (ref 0.44–1.00)
GFR, Estimated: 14 mL/min — ABNORMAL LOW (ref >60)
Glucose, Bld: 85 mg/dL (ref 70–99)
Phosphorus: 3.2 mg/dL (ref 2.5–4.6)
Potassium: 4.5 mmol/L (ref 3.5–5.1)
Sodium: 130 mmol/L — ABNORMAL LOW (ref 135–145)

## 2022-02-23 LAB — COMPREHENSIVE METABOLIC PANEL
ALT: 48 U/L — ABNORMAL HIGH (ref 0–44)
AST: 171 U/L — ABNORMAL HIGH (ref 15–41)
Albumin: 2.6 g/dL — ABNORMAL LOW (ref 3.5–5.0)
Alkaline Phosphatase: 274 U/L — ABNORMAL HIGH (ref 38–126)
Anion gap: 10 (ref 5–15)
BUN: 30 mg/dL — ABNORMAL HIGH (ref 6–20)
CO2: 26 mmol/L (ref 22–32)
Calcium: 8.1 mg/dL — ABNORMAL LOW (ref 8.9–10.3)
Chloride: 91 mmol/L — ABNORMAL LOW (ref 98–111)
Creatinine, Ser: 4.11 mg/dL — ABNORMAL HIGH (ref 0.44–1.00)
GFR, Estimated: 14 mL/min — ABNORMAL LOW (ref >60)
Glucose, Bld: 219 mg/dL — ABNORMAL HIGH (ref 70–99)
Potassium: 5.9 mmol/L — ABNORMAL HIGH (ref 3.5–5.1)
Sodium: 127 mmol/L — ABNORMAL LOW (ref 135–145)
Total Bilirubin: 1.2 mg/dL (ref 0.3–1.2)
Total Protein: 5.7 g/dL — ABNORMAL LOW (ref 6.5–8.1)

## 2022-02-23 LAB — GLUCOSE, CAPILLARY
Glucose-Capillary: 102 mg/dL — ABNORMAL HIGH (ref 70–99)
Glucose-Capillary: 105 mg/dL — ABNORMAL HIGH (ref 70–99)
Glucose-Capillary: 109 mg/dL — ABNORMAL HIGH (ref 70–99)
Glucose-Capillary: 122 mg/dL — ABNORMAL HIGH (ref 70–99)
Glucose-Capillary: 135 mg/dL — ABNORMAL HIGH (ref 70–99)
Glucose-Capillary: 135 mg/dL — ABNORMAL HIGH (ref 70–99)
Glucose-Capillary: 18 mg/dL — CL (ref 70–99)
Glucose-Capillary: 264 mg/dL — ABNORMAL HIGH (ref 70–99)
Glucose-Capillary: 278 mg/dL — ABNORMAL HIGH (ref 70–99)
Glucose-Capillary: 40 mg/dL — CL (ref 70–99)
Glucose-Capillary: 42 mg/dL — CL (ref 70–99)
Glucose-Capillary: 48 mg/dL — ABNORMAL LOW (ref 70–99)
Glucose-Capillary: 71 mg/dL (ref 70–99)
Glucose-Capillary: 77 mg/dL (ref 70–99)
Glucose-Capillary: 91 mg/dL (ref 70–99)

## 2022-02-23 LAB — CBC
HCT: 28.7 % — ABNORMAL LOW (ref 36.0–46.0)
Hemoglobin: 9.9 g/dL — ABNORMAL LOW (ref 12.0–15.0)
MCH: 29.6 pg (ref 26.0–34.0)
MCHC: 34.5 g/dL (ref 30.0–36.0)
MCV: 85.7 fL (ref 80.0–100.0)
Platelets: 158 10*3/uL (ref 150–400)
RBC: 3.35 MIL/uL — ABNORMAL LOW (ref 3.87–5.11)
RDW: 14.2 % (ref 11.5–15.5)
WBC: 4.4 10*3/uL (ref 4.0–10.5)
nRBC: 0.9 % — ABNORMAL HIGH (ref 0.0–0.2)

## 2022-02-23 LAB — HEPATITIS B SURFACE ANTIBODY, QUANTITATIVE: Hep B S AB Quant (Post): 8 m[IU]/mL — ABNORMAL LOW (ref >9.9)

## 2022-02-23 LAB — HEPATITIS B SURFACE ANTIGEN: Hepatitis B Surface Ag: REACTIVE — AB

## 2022-02-23 MED ORDER — NYSTATIN 100000 UNIT/ML MT SUSP: 5.0000 mL | Freq: Four times a day (QID) | OROMUCOSAL | 0 refills | Status: DC

## 2022-02-23 MED ORDER — DEXTROSE IN LACTATED RINGERS 5 % IV SOLN: INTRAVENOUS | Status: DC

## 2022-02-23 MED ORDER — DEXTROSE 50 % IV SOLN
1.0000 | Freq: Once | INTRAVENOUS | Status: AC
  Administered 2022-02-23: 50 mL via INTRAVENOUS

## 2022-02-23 MED ORDER — SODIUM ZIRCONIUM CYCLOSILICATE 10 G PO PACK: 10.0000 g | PACK | Freq: Once | ORAL | Status: DC

## 2022-02-23 MED ORDER — OXYCODONE-ACETAMINOPHEN 5-325 MG PO TABS
2.0000 | ORAL_TABLET | ORAL | Status: DC | PRN
  Administered 2022-02-23 – 2022-02-24 (×3): 2 via ORAL
  Filled 2022-02-23 (×3): qty 2

## 2022-02-23 MED ORDER — HYDROXYZINE HCL 10 MG/5ML PO SYRP
10.0000 mg | ORAL_SOLUTION | Freq: Four times a day (QID) | ORAL | Status: DC | PRN
  Administered 2022-02-23: 10 mg via ORAL
  Filled 2022-02-23 (×2): qty 5

## 2022-02-23 MED ORDER — LIDOCAINE-PRILOCAINE 2.5-2.5 % EX CREA: 1.0000 | TOPICAL_CREAM | CUTANEOUS | 0 refills | Status: DC | PRN

## 2022-02-23 MED ORDER — NYSTATIN 100000 UNIT/ML MT SUSP: 5.0000 mL | Freq: Four times a day (QID) | OROMUCOSAL | 0 refills | Status: AC

## 2022-02-23 MED ORDER — LANTUS SOLOSTAR 100 UNIT/ML ~~LOC~~ SOPN: 5.0000 [IU] | PEN_INJECTOR | Freq: Every day | SUBCUTANEOUS | 11 refills | Status: DC

## 2022-02-23 MED ORDER — HYDROXYZINE HCL 25 MG PO TABS
25.0000 mg | ORAL_TABLET | Freq: Once | ORAL | Status: AC
  Administered 2022-02-23: 25 mg via ORAL
  Filled 2022-02-23: qty 1

## 2022-02-23 MED ORDER — PHENYLEPHRINE-MINERAL OIL-PET 0.25-14-74.9 % RE OINT: 1.0000 | TOPICAL_OINTMENT | Freq: Two times a day (BID) | RECTAL | 0 refills | Status: DC | PRN

## 2022-02-23 NOTE — Significant Event (Signed)
Rapid Response Event Note   Reason for Call :  Hypoglycemia, CBG 18; lethargy   Initial Focused Assessment:  Patient in bed, alert and oriented; complaints of being hot.   Interventions:  -d50 amp 26ml -Nephrology NP at bedside -Orders placed -Labs obtained -Repeat CBG 135   Plan of Care:  Give lokelma depending on lab results, discharge. Call if additional hypoglycemia.    Event Summary:   MD Notified: Velva Harman NP at bedside Call Time: [place] Time: 1632 End Time: 0698  Newman Nickels, RN

## 2022-02-23 NOTE — Progress Notes (Signed)
   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  I was NOT aware of this phenomenon  I am QUITE puzzled as to why the hep B surface antigen was then checked during this admission in the first place --or why ANY Of the hepatitis labs were checked--I imagine it is part of an order set. Regardless it endeed up creating unnecessary anxiety and work--though I am grateful to the expansion of my own knowledge I would advise against rechecking these labs (has only been 2 weeks) but will defer to my colleagues in Nephrology

## 2022-02-23 NOTE — Discharge Summary (Cosign Needed Addendum)
Name: Ann Robertson MRN: [id number] DOB: April 01, 1991 31 y.o. PCP: Ann Robertson  Date of Admission: 02/21/2022  3:36 PM Date of Discharge: 02/24/2022 Attending Physician: Ann Contes, MD  Discharge Diagnosis: 1. Principal Problem:   Hyperosmolar hyperglycemic state (HHS) ([place]) Hypoglycemia Brittle Type 1 diabetes  Discharge Medications: Allergies as of 02/23/2022       Reactions   Hydromorphone Hcl Itching, Rash        Medication List     STOP taking these medications    benzocaine 10 % mucosal gel Commonly known as: ORAJEL   fluconazole 100 MG tablet Commonly known as: DIFLUCAN   insulin lispro 100 UNIT/ML KwikPen Commonly known as: HUMALOG   ondansetron 4 MG tablet Commonly known as: ZOFRAN       TAKE these medications    gabapentin 100 MG capsule Commonly known as: NEURONTIN Take 100 mg by mouth daily.   labetalol 100 MG tablet Commonly known as: NORMODYNE Take 100 mg by mouth 2 (two) times daily.   Lantus SoloStar 100 UNIT/ML Solostar Pen Generic drug: insulin glargine Inject 5 Units into the skin daily. Inject 2-5 units as needed for High glucose levels What changed: how much to take   lidocaine-prilocaine cream Commonly known as: EMLA Apply 1 Application topically as needed (For port access).   methocarbamol 750 MG tablet Commonly known as: ROBAXIN Take 750 mg by mouth 3 (three) times daily.   nystatin 100000 UNIT/ML suspension Commonly known as: MYCOSTATIN Take 5 mLs (500,000 Units total) by mouth 4 (four) times daily for 10 days.   omeprazole 20 MG capsule Commonly known as: PRILOSEC Take 20 mg by mouth daily as needed (acid reflux/heartburn).   oxyCODONE-acetaminophen 10-325 MG tablet Commonly known as: PERCOCET Take 1 tablet by mouth every 4 (four) hours as needed for pain.   phenylephrine-shark liver oil-mineral oil-petrolatum 0.25-14-74.9 % rectal ointment Commonly known as: PREPARATION H Place 1 Application  rectally 2 (two) times daily as needed for hemorrhoids.        Disposition and follow-up:   Ms.Ann Robertson was discharged from [facility] in Stable condition.  At the hospital follow up visit please address:  1.  HHS/uncontrolled type 1 DM c/b peripheral diabetes nephropathy and ESRD: Patient had multiple hypoglycemic episodes during this hospitalization. The lowest of which was 18 the day prior to discharge. She never developed a seizure or coma. Her dose was eventually decreased to 3u of long acting insulin at nighttime. Her blood glucose levels eventually normalized. She was given a CGM and was given information to follow up with diabetes educator to get this set up as well as information on how to check her blood sugars with her glucometer and when/how to correct low blood sugars. She has a follow up appointment with her PCP  10/3 and an appointment to establish with endocrinology 11/8. She was also discharged with intranasal glucagon and glucose gel.   ESRD: Patient followed outpatient by Dr. Hollie Robertson with dialysis on M/W/F.  Patient noted she is due for fistulogram.  Follow-up to ensure she is attending dialysis beginning 9/27.  Diarrhea/abdominal pain: Patient initially with diarrhea and abdominal pain but this improved with hemorrhoidal cream. Patient's imodium was also increased. She will need follow up on discharge to ensure this has resolved. Please follow-up pending labs and for continued resolution of symptoms. She will also likely need GI follow up for possible repeat colonoscopy for possible colitis. Also considered pancreatic insufficiency. Her fecal elastase is still pending.  Possible cirrhosis: RUQ this hospitalization concerning for possible cirrhosis. She will need further work up for this. Her hepatitis b initially came back positive, but it appears per ID that this was likely in the setting of recent vaccine administration and not actual hepatitis infection.  Her hep b core was also negative and her hep b dna level was undetectable.   HTN: Patient discharged with labetalol 100 mg twice daily.  She may need addition of amlodipine at follow-up.  HLD: Discuss if necessary to continue crestor or to switch to another lipid lowering medication.   Hypothyroidism: Not currently on synthroid with normal TSH. Will need to discuss with PCP if this medication is still needed. Discussed with patient to hold this medication.   2.  Labs / imaging needed at time of follow-up: CMP (renal function panel was obtained during HD session), patient gets HD MWF  3.  Pending labs/ test needing follow-up: Celiac labs, fecal elastase, Hep B labs   Follow-up Appointments:  Endocrinology   Hospital Course by problem list: 1. HHS: Patient presented with blood glucose of 1179 in the setting of uncontrolled type 1 diabetes.  This was felt to be due to over correcting a low blood sugar at home by drinking juice.  Blood sugar remained high and she presented to ED for advice.  Last A1c 14.3.  She was placed on Endo tool without potassium repletion given ESRD.  Glucose decreased below 200, patient transitioned to Semglee 10 units and NovoLog 4 units 3 times daily with meals with sensitive SSI.  Several borderline low blood glucose readings occurred and thus Semglee decreased to 5 at discharge.  She was not requiring short acting insulin and this was discontinued at time of discharge.  Received CGM and educated on set up.  Her extremely brittle glycemia with dangerous highs and lows has been concerning with regard to f/u plans.  No inpatient endocrinology consultation is available.  Her new endocrinologist with whom she's scheduled in NOvember was unable to weigh in as she hasn't yet established care.  [name] lives with several family members who will help monitor her and who know how to intervene if she develops hypoglycemia.  She was discharge urgently so that she could be present  for an apartment inspection; missing it would put her at risk of eviction.  Upper extremity spasms: Initially presented with spasms of the upper extremities most predominant on the left.  She states that she had been on gabapentin which helped with this.  However, she stopped this 2 days prior to admission and her spasms returned.  Differential includes electrolyte derangement from ESRD versus focal seizures versus hyperglycemia versus dehydration versus acidosis.  Held methocarbamol, restarted gabapentin. Patient's spasms resolved. Will discharge patient with instructions to continue gabapentin for these spasms.   Mild transaminitis: Patient initially with elevated alkaline phosphatase though this has been downtrending.  This was thought to be due to to ESRD disturbances and secondary bone demineralization.  However, GGT found to be elevated to 271 prompting consideration for hepatobiliary etiology.  RUQ [place] 9/25 with slightly nodular hepatic margin suggesting cirrhosis.  Hep B surface antigen positive but Ab negative. ID consulted and felt this positive Hep B surface Ag was positive due to recent Hep B vaccination. Patient asymptomatic. She will need follow up with GI.   Hypothyroidism Reportedly told to discontinue synthroid. Patient discharged without this medication.   Discharge subjective: Patient states she is ready to go home today.  She has an  inspection tomorrow and if she is not able to clean her house she could be evicted.  She states she scheduled follow up with her PCP 10/3 and with endocrinology 11/8.  Discharge Exam:   BP 126/89 (BP Location: Right Arm)   Pulse (!) 104   Temp 98.5 F (36.9 C) (Oral)   Resp 19   Ht 5\' 5"  (1.651 m)   Wt 53 kg   SpO2 95%   BMI 19.44 kg/m  Discharge exam:   Physical Exam Constitutional:      General: She is not in acute distress. HENT:     Head: Normocephalic and atraumatic.  Eyes:     Extraocular Movements: Extraocular movements intact.   Cardiovascular:     Rate and Rhythm: Normal rate and regular rhythm. LUE AV fistula with continuous bruit, brisk capillary refill at left distal digits Pulmonary:     Effort: Pulmonary effort is normal.  Abdominal:     General: Abdomen is flat. There is no distension.     Palpations: Abdomen is soft.     Tenderness: There is no abdominal tenderness.  Skin:    General: Skin is warm and dry.     Capillary Refill: Capillary refill takes less than 2 seconds.  Neurological:     General: No focal deficit present.     Mental Status: She is alert and oriented to person, place, and time.  Psychiatric:        Mood and Affect: Mood normal.    Pertinent Labs, Studies, and Procedures:     Latest Ref Rng & Units 02/24/2022   10:11 AM 02/23/2022    5:01 PM 02/23/2022    3:51 AM  BMP  Glucose 70 - 99 mg/dL 193  85  219   BUN 6 - 20 mg/dL 41  33  30   Creatinine 0.44 - 1.00 mg/dL 4.93  4.04  4.11   Sodium 135 - 145 mmol/L 127  130  127   Potassium 3.5 - 5.1 mmol/L 6.4  4.5  5.9   Chloride 98 - 111 mmol/L 92  97  91   CO2 22 - 32 mmol/L 24  24  26    Calcium 8.9 - 10.3 mg/dL 7.8  7.2  8.1    Component Ref Range & Units 4 d ago (02/22/22) 2 wk ago (02/08/22) 8 mo ago (06/18/21) 8 mo ago (06/16/21) 6 yr ago (06/06/15) 7 yr ago (05/06/14) 9 yr ago (01/08/13)  Hepatitis B Surface Ag NON REACTIVE Reactive Abnormal   NON REACTIVE CM  NON REACTIVE CM  NON REACTIVE CM  Negative R  NEGATIVE R  NEGATIVE R   Comment: Sample Positive for HBsAg. Result confirmed by neutralization.  HEALTH DEPARTMENT NOTIFIED    Component Ref Range & Units 4 d ago 2 wk ago 8 mo ago  Hep B S Ab NON REACTIVE NON REACTIVE  Reactive Abnormal  CM  NON REACTIVE CM    Component Ref Range & Units 3 d ago (02/23/22) 8 mo ago (06/18/21) 8 mo ago (06/16/21)  HBV DNA SERPL PCR-ACNC IU/mL HBV DNA not detected  HBV DNA not detected  HBV DNA not detected   HBV DNA SERPL PCR-LOG IU log10 IU/mL UNABLE TO CALCULATE  UNABLE TO CALCULATE CM   UNABLE TO CALCULATE CM   Comment: (NOTE)  Unable to calculate result since non-numeric result obtained for  component test.    US Abdomen Limited RUQ (LIVER/GB)  Result Date: 02/22/2022 CLINICAL DATA:  Transaminitis. EXAM:  ULTRASOUND ABDOMEN LIMITED RIGHT UPPER QUADRANT COMPARISON:  April 19, 2021. FINDINGS: Gallbladder: No gallstones or wall thickening visualized. No sonographic Murphy sign noted by sonographer. Common bile duct: Diameter: 3 mm which is within normal limits. Liver: No focal lesion identified. Slightly nodular hepatic margins are noted suggesting cirrhosis. Portal vein is patent on color Doppler imaging with normal direction of blood flow towards the liver. Other: Mild ascites is noted around the liver. IMPRESSION: Slightly nodular hepatic margins are noted suggesting hepatic cirrhosis. Mild ascites is noted. Electronically Signed   By: Marijo Conception M.D.   On: 02/22/2022 11:37     Discharge Instructions: Discharge Instructions     Call MD for:   Complete by: As directed    Blood sugars persistently above 300 or persistently less than 70. Your insulin dose will likely need to be adjusted.   Diet Carb Modified   Complete by: As directed    Increase activity slowly   Complete by: As directed       Discharge Instructions      Please check your blood sugars in the morning before you eat and before each of your meals. If you do get your freestyle libre set up this afternoon you will be able to check your blood sugar at all times and ensure you do not have any lows. This is very important to stay on top of this. We are discharging you on Lantus 5u nightly only.   Regarding your appointment, please follow up with your clinic on Tuesday if possible or call our office at [phone] and schedule an appointment to make sure your sugars are better controlled. I am going to attach a document to help you remember how to treat low blood sugars in a more controlled way and  hopefully help prevent high blood sugars. This along with daily insulin use should hopefully help your symptoms.    For your oral lesion (white plaques) we have discharged you with nystatin swish and spit to help with this.       Signed: Rick Duff, MD  02/24/2022 0325 pm

## 2022-02-23 NOTE — Progress Notes (Signed)
@   approx 1725 rcvd call from patient stating that she feels BG could be low once again. Check POC BG and BG 40 and patient appears visibly weak and fatigued. Additional 81mL of D50 administered and called nutrition to order a STAT dinner tray for patient.   Informed primary MD and spoke with Dr. Gwendolyn Fill regarding patient's status and he informs that he will come to bedside to further evaluate the patient.   Recheck BG at 1736 and is 105; patient given crackers and peanut butter and apple juice and encouraged to eat, still pending dinner tray.  MD approached RN after visiting with patient and requests that RN check BG again as patient feels that it is low again. BG noted to be < 50 additional 69mL of D50 administered via IV. Dinner tray is at bedside and patient encouraged to eat.  Discussed with Dr. Gwendolyn Fill patient's status and labile BG; MD advises to monitor patient extra closely for an hour. Expresses concerns regarding patient extremely labile and symptomatic hypoglycemia and discussed concern that patient may be suited for higher acuity floor for closer monitoring since BG seems to be dropping considerably low in a timeframe that is less than 1 hour.  MD states that he does not feel that the patient is critical or need additional intervention or monitoring in higher level of care despite labile BG and symptoms. Strongly expressed my concerns to MD regarding patient's safety and status. He states that no changes will be made for now, but nursing is expected to monitor even closer than 1 hour intervals.  Throughout events patient has remained oriented x4 and vitals (BP/MAP, O2, HR, RR) stable.

## 2022-02-23 NOTE — Progress Notes (Signed)
Rapid Rns at bedside. Also, nephrology NP at bedside and has ordered stat labs. Phlebotomy called for labs.

## 2022-02-23 NOTE — Progress Notes (Signed)
@  1900 BG noted to be 48; patient expressing generalized discomfort, shaking, and pupillary response was sluggish. D50 administered. MD team paged and RRT called. Again expressed to MD team that patient needs further intervention since BG is continuing to drop in intervals less than 1 hour; patient is refusing to eat entire dinner meal despite it being at bedside and now going on 3 amp of D50.   RRT states will come to bedside.  IVF ordered placed.

## 2022-02-23 NOTE — Discharge Instructions (Addendum)
Please check your blood sugars in the morning before you eat and before each of your meals. When you do get your freestyle libre set up tomorrow morning you will be able to check your blood sugar at all times and ensure you do not have any lows. This is very important to stay on top of this. We are discharging you on Lantus 3u nightly along with 1-3u of meal time insulin. We also gave you prescription for glucagon if your blood sugars become so low you lose consciousness and glucose gel as well.   Please keep your endocrinology appointment that you currently have, but we will send a referral to endocrinology here at [facility] to see if you are able to get an appointment sooner.   Regarding your appointment, please follow up with your clinic on Tuesday if possible or call our office at [phone] and schedule an appointment to make sure your sugars are better controlled. I am going to attach a document to help you remember how to treat low blood sugars in a more controlled way and hopefully help prevent high blood sugars. This along with daily insulin use should hopefully help your symptoms.    For your oral lesion (white plaques) we have discharged you with nystatin swish and spit to help with this.    For your diarrhea. Please use the increased dose of imodium we have given you. Please take up to 4 tablets a day until you have 1 to 2 stools only. Then you will need to stay on this dose for approximately a week. It is important that you speak with your primary care physician about when to stop this medication. Because of the amount of time that you have had this diarrhea it is also important for you to see the gastroenterology doctors as well.

## 2022-02-23 NOTE — Progress Notes (Signed)
      INFECTIOUS DISEASE ATTENDING ADDENDUM:   Date: 02/23/2022  Patient name: Ann Robertson  Medical record number: [id number]  Date of birth: [birth year]   I was called by Infection Prevention team re this patient's hepatitis labs  She NOW has positive hepatitis  B SURFACE ANTIGEN on labs drawn during this admission vs 2 weeks ago when it was negative.  She does have measuable quantitative antibodies to hep B as well.  I can see that HBV DNA's have been measured on several occasions as well and negative so far (I am not sure why they were ordered as I do not see for example any isolated hep B core ab)  REGARDLESS IF her HEP B SURFACE ANTIGEN is a TRUE POSITIVE NOW then she has hepatitis B infection  This would seem unusual in context of her vaccination and frequent monitoring but until we can refute this as a false + lab we need to proceed with presumption that she does have Hep b infection and institute appropriate measures re her HD etc  I have ordered a repeat HBV surface antigen just now and she has HBV DNA still incubaint  I spent greater than 7 minutes reviewing this patient's case with clinician/   Rhina Brackett Dam 02/23/2022, 2:14 PM

## 2022-02-23 NOTE — Progress Notes (Signed)
D/C order noted. Contacted [facility] and spoke to [facility] advised pt to d/c today and will resume care tomorrow. Pt's case discussed with renal NP and pt will not require any changes at out-pt HD clinic due to Hep B lab result (see ID last note for today).   Melven Sartorius Renal Navigator [phone]

## 2022-02-23 NOTE — Significant Event (Signed)
Rapid Response Event Note   Reason for Call :  Hypoglycemia, CBG-48  This is the 3rd hypoglycemic event for pt since 1630 today. All events were treated with D50 per protocol.  Initial Focused Assessment:  Pt lying in bed with eyes open, in no visible distress. Pt alert and oriented. Per pt, she is no longer experiencing hypoglycemic symptoms. Skin warm and dry.  HR-90, BP-145/95, RR-18, SpO2-100% on RA.  Interventions:  Repeat CBG-122 @1927  D5LR@100cc /hr  Plan of Care:  CBG-122 after D50. D5LR started. Continue to monitor pt closely. Call RRT if further assistance needed.   Event Summary:   MD Notified: Dr. Carin Primrose notified PTA RRT Call Elida End [id number]  Dillard Essex, RN

## 2022-02-23 NOTE — Progress Notes (Signed)
Mds rounding on unit, updated doctors, current  blood sugartrends, and overall status, CBGs trending up last couple hours 77,71,91,102 consequently. Will continue plan of care and monitoring as ordered. Also noted pt had incontinent stool, pt noted with increase labia swelling no drainage or redness noted, pt states this is unusual and new, area +2 to +3 swelling. SRP, RN

## 2022-02-23 NOTE — Progress Notes (Signed)
Informed by pt's primary nurse Ryan to call rapid on this pt d/t cbg of 18 and pt lethargic. Pt given amp of D50 IVP. Vitals checked.

## 2022-02-23 NOTE — Progress Notes (Signed)
[facility] Progress Note   Subjective: Seen in room. No specific complaints.  Last BS 135  Objective Vitals:   02/22/22 2334 02/23/22 0539 02/23/22 0750 02/23/22 1206  BP: 127/82 (!) 139/99 (!) 151/96 126/89  Pulse: (!) 102 100 97 (!) 104  Resp: 19 16 20 19   Temp: 99 F (37.2 C) 98.5 F (36.9 C) 98.8 F (37.1 C) 98.5 F (36.9 C)  TempSrc: Oral Oral Oral Oral  SpO2:   100% 95%  Weight:      Height:       General: Chronically ill appearing female who looks older than stated age.  Lungs: Clear bilaterally to auscultation without wheezes, rales, or rhonchi. Breathing is unlabored. Heart: RRR with S1 S2. No murmurs, rubs, or gallops appreciated. Abdomen: Soft, non-tender, non-distended with normoactive bowel sounds. No rebound/guarding. No obvious abdominal masses. Lower extremities: BLE trace edema posterior calves.  Neuro: Alert and oriented X 3. Moves all extremities spontaneously. Dialysis Access: L AVG + T/B  Additional Objective Labs: Basic Metabolic Panel: Recent Labs  Lab 02/21/22 2043 02/22/22 0148 02/22/22 1235 02/22/22 2214 02/23/22 0351  NA  --    < > 130*  129* 131* 127*  K  --    < > 4.1  4.3 4.6 5.9*  CL  --    < > 94*  91* 91* 91*  CO2  --    < > 25  27 29 26   GLUCOSE  --    < > 70  73 100* 219*  BUN  --    < > 34*  34* 28* 30*  CREATININE  --    < > 4.34*  4.42* 3.82* 4.11*  CALCIUM  --    < > 8.2*  8.3* 8.3* 8.1*  PHOS 4.6  --  3.0  --   --    < > = values in this interval not displayed.   Liver Function Tests: Recent Labs  Lab 02/21/22 1611 02/22/22 1235 02/23/22 0351  AST 50* 102* 171*  ALT 27 32 48*  ALKPHOS 242* 234* 274*  BILITOT 1.1 1.2 1.2  PROT 5.9* 5.2* 5.7*  ALBUMIN 2.7* 2.5*  2.5* 2.6*   Recent Labs  Lab 02/21/22 1611  LIPASE 24   CBC: Recent Labs  Lab 02/21/22 1611 02/21/22 1639 02/22/22 1235 02/23/22 0351  WBC 4.6  --  5.2 4.4  NEUTROABS 3.3  --   --   --   HGB 8.9* 11.6* 10.2* 9.9*   HCT 29.3* 34.0* 31.3* 28.7*  MCV 96.1  --  87.7 85.7  PLT 201  --  215 158   Blood Culture    Component Value Date/Time   SDES  04/19/2021 1024    BLOOD BLOOD RIGHT HAND Performed at [facility], [place] [address], [place], [place] [address]    SDES  04/19/2021 1024    BLOOD BLOOD RIGHT WRIST Performed at [place] [address], [place], [place] [address]    [place]  04/19/2021 1024    BOTTLES DRAWN AEROBIC ONLY Blood Culture adequate volume Performed at [place] [address], [place], [place] [address]    [place]  04/19/2021 1024    BOTTLES DRAWN AEROBIC ONLY Blood Culture adequate volume Performed at [place] [address] [place], [place] [address]    CULT  04/19/2021 1024    NO GROWTH 5 DAYS Performed at [place],  [place] [address]    CULT  04/19/2021 1024    NO GROWTH 5 DAYS Performed at [facility], [place] [address], [place], [place] [address]    REPTSTATUS 04/24/2021 FINAL 04/19/2021 1024   REPTSTATUS 04/24/2021 FINAL 04/19/2021 1024    Cardiac Enzymes: No results for input(s): "CKTOTAL", "CKMB", "CKMBINDEX", "TROPONINI" in the last 168 hours. CBG: Recent Labs  Lab 02/22/22 2107 02/22/22 2345 02/23/22 0511 02/23/22 0835 02/23/22 1215  GLUCAP 87 109* 264* 278* 135*   Iron Studies:  Recent Labs    02/22/22 0148  IRON 79  TIBC 162*  FERRITIN 1,453*   @lablastinr3 @ Studies/Results: US Abdomen Limited RUQ (LIVER/GB)  Result Date: 02/22/2022 CLINICAL DATA:  Transaminitis. EXAM: ULTRASOUND ABDOMEN LIMITED RIGHT UPPER QUADRANT COMPARISON:  April 19, 2021. FINDINGS: Gallbladder: No gallstones or wall thickening visualized. No sonographic Murphy sign noted by sonographer. Common bile duct: Diameter: 3 mm which is within normal limits. Liver: No focal lesion identified. Slightly nodular hepatic margins are noted  suggesting cirrhosis. Portal vein is patent on color Doppler imaging with normal direction of blood flow towards the liver. Other: Mild ascites is noted around the liver. IMPRESSION: Slightly nodular hepatic margins are noted suggesting hepatic cirrhosis. Mild ascites is noted. Electronically Signed   By: Marijo Conception M.D.   On: 02/22/2022 11:37   Medications:   (feeding supplement) PROSource Plus  30 mL Oral BID BM   amLODipine  10 mg Oral Daily   Chlorhexidine Gluconate Cloth  6 each Topical Q0600   darbepoetin (ARANESP) injection - DIALYSIS  40 mcg Intravenous Q Mon-HD   [START ON 02/24/2022] doxercalciferol  1 mcg Intravenous Q M,W,F-HD   feeding supplement (NEPRO CARB STEADY)  237 mL Oral BID BM   heparin  5,000 Units Subcutaneous Q8H   insulin aspart  0-6 Units Subcutaneous TID WC   insulin glargine-yfgn  5 Units Subcutaneous Daily   levothyroxine  25 mcg Oral Q0600   nystatin  5 mL Oral QID     Dialysis Orders: [facility]: [facility] MWF 4 hr 180NRe 400/500 50.8 kg 2.0 K/2.0 Ca AVG -Heparin 2500 units IV TIW -Hectorol 1 mcg IV TIW - Mircera 50 mcg IV q 2 weeks (last dose 75 mcg IV 01/25/2022) - Venofer 50 mg IV weekly     Assessment/Plan:  HHS-per primary. BS now controlled. Management per primary.  Upper extremity spasms-W/U per primary  Mild transaminitis-per primary  ESRD -  MWF HD today on schedule, Next HD   Hypertension/volume  - No Net UF or post wt documented from HD 02/23/2022. Order daily wts. Still with LE edema. Corrected Na 130. Continue to lower volume as tolerated.   Anemia  - HGB 10.2. No recent ESA doses at OP center. Given Aranesp 40 mcg IV 79/72/8206   Metabolic bone disease -  OP labs at goal. Continue velphoro binders, VDRA  Nutrition - Albumin 2.5. Add protein supplements.  Osiris Charles H. Nichoel Digiulio NP-C 02/23/2022, 1:20 PM  [employer] [phone]

## 2022-02-23 NOTE — Progress Notes (Signed)
Report given to [facility], endorsed shifts events to oncoming RN. RRT was at bedside assisting during change of shift report. BG has come up to 122, and patient started on IVF with dextrose (see MAR). Patient stable at time of bedside handoff.

## 2022-02-23 NOTE — Progress Notes (Signed)
@   approx. 1630 rounded on patient to obtain vital signs and BG and complete planned discharge. Patient appeared to be drowsy, though was easliy arousable and was still oriented x4, visible weakness was noted. POC BG was checked and noted to be 18. Administered D50 74mL.Called RRT, paged provider team. Vital signs were stable, no indications of respiratory or cardiovascular compromise.  RRT team arrived to bedside to assist, provided SBAR and nephrology NP arrives at bedside.  Rechecked POC BG @ 1642 and 135 s/p 1 amp of D50 (61mL).  Patient become more alert and interactive, oriented x4. NP placed orders for STAT labs; K+ was noted to be 5.9 on AM labs, NP ordered lokelma but gives instructions to discontinue.  Primary MD team was also notified during this event.

## 2022-02-24 LAB — GLUCOSE, CAPILLARY
Glucose-Capillary: 102 mg/dL — ABNORMAL HIGH (ref 70–99)
Glucose-Capillary: 72 mg/dL (ref 70–99)
Glucose-Capillary: 78 mg/dL (ref 70–99)
Glucose-Capillary: 81 mg/dL (ref 70–99)
Glucose-Capillary: 101 mg/dL — ABNORMAL HIGH (ref 70–99)
Glucose-Capillary: 120 mg/dL — ABNORMAL HIGH (ref 70–99)
Glucose-Capillary: 122 mg/dL — ABNORMAL HIGH (ref 70–99)
Glucose-Capillary: 127 mg/dL — ABNORMAL HIGH (ref 70–99)
Glucose-Capillary: 170 mg/dL — ABNORMAL HIGH (ref 70–99)
Glucose-Capillary: 121 mg/dL — ABNORMAL HIGH (ref 70–99)
Glucose-Capillary: 149 mg/dL — ABNORMAL HIGH (ref 70–99)
Glucose-Capillary: 191 mg/dL — ABNORMAL HIGH (ref 70–99)
Glucose-Capillary: 198 mg/dL — ABNORMAL HIGH (ref 70–99)

## 2022-02-24 LAB — RENAL FUNCTION PANEL
Albumin: 2.5 g/dL — ABNORMAL LOW (ref 3.5–5.0)
Anion gap: 11 (ref 5–15)
BUN: 41 mg/dL — ABNORMAL HIGH (ref 6–20)
CO2: 24 mmol/L (ref 22–32)
Calcium: 7.8 mg/dL — ABNORMAL LOW (ref 8.9–10.3)
Chloride: 92 mmol/L — ABNORMAL LOW (ref 98–111)
Creatinine, Ser: 4.93 mg/dL — ABNORMAL HIGH (ref 0.44–1.00)
GFR, Estimated: 11 mL/min — ABNORMAL LOW (ref >60)
Glucose, Bld: 193 mg/dL — ABNORMAL HIGH (ref 70–99)
Phosphorus: 4.6 mg/dL (ref 2.5–4.6)
Potassium: 6.4 mmol/L (ref 3.5–5.1)
Sodium: 127 mmol/L — ABNORMAL LOW (ref 135–145)

## 2022-02-24 LAB — HEPATITIS B DNA, ULTRAQUANTITATIVE, PCR
HBV DNA SERPL PCR-ACNC: NOT DETECTED IU/mL
HBV DNA SERPL PCR-LOG IU: UNDETERMINED log10 IU/mL

## 2022-02-24 LAB — CBC
HCT: 29.8 % — ABNORMAL LOW (ref 36.0–46.0)
Hemoglobin: 9.7 g/dL — ABNORMAL LOW (ref 12.0–15.0)
MCH: 29 pg (ref 26.0–34.0)
MCHC: 32.6 g/dL (ref 30.0–36.0)
MCV: 89.2 fL (ref 80.0–100.0)
Platelets: 174 10*3/uL (ref 150–400)
RBC: 3.34 MIL/uL — ABNORMAL LOW (ref 3.87–5.11)
RDW: 14.7 % (ref 11.5–15.5)
WBC: 4 10*3/uL (ref 4.0–10.5)
nRBC: 1.5 % — ABNORMAL HIGH (ref 0.0–0.2)

## 2022-02-24 LAB — GLIADIN ANTIBODIES, SERUM
Antigliadin Abs, IgA: 7 units (ref 0–19)
Gliadin IgG: 2 units (ref 0–19)

## 2022-02-24 MED ORDER — LABETALOL HCL 100 MG PO TABS
100.0000 mg | ORAL_TABLET | Freq: Two times a day (BID) | ORAL | Status: DC
  Filled 2022-02-24: qty 1

## 2022-02-24 MED ORDER — LOPERAMIDE HCL 2 MG PO CAPS: ORAL_CAPSULE | ORAL | 0 refills | Status: DC

## 2022-02-24 MED ORDER — LANTUS SOLOSTAR 100 UNIT/ML ~~LOC~~ SOPN: 3.0000 [IU] | PEN_INJECTOR | Freq: Every day | SUBCUTANEOUS | 11 refills | Status: DC

## 2022-02-24 MED ORDER — GABAPENTIN 100 MG PO CAPS: 100.0000 mg | ORAL_CAPSULE | Freq: Two times a day (BID) | ORAL | Status: DC

## 2022-02-24 MED ORDER — LOPERAMIDE HCL 2 MG PO CAPS
4.0000 mg | ORAL_CAPSULE | Freq: Once | ORAL | Status: AC
  Administered 2022-02-24: 4 mg via ORAL

## 2022-02-24 MED ORDER — HYDROXYZINE HCL 25 MG PO TABS: 25.0000 mg | ORAL_TABLET | Freq: Two times a day (BID) | ORAL | Status: DC | PRN

## 2022-02-24 MED ORDER — BAQSIMI ONE PACK 3 MG/DOSE NA POWD: 1.0000 | NASAL | 0 refills | Status: DC | PRN

## 2022-02-24 MED ORDER — INSULIN LISPRO (1 UNIT DIAL) 100 UNIT/ML (KWIKPEN): 1.0000 [IU] | PEN_INJECTOR | SUBCUTANEOUS | 11 refills | Status: DC

## 2022-02-24 MED ORDER — LOPERAMIDE HCL 2 MG PO CAPS
4.0000 mg | ORAL_CAPSULE | Freq: Once | ORAL | Status: DC
  Filled 2022-02-24: qty 2

## 2022-02-24 MED ORDER — HYDROXYZINE HCL 25 MG PO TABS: 25.0000 mg | ORAL_TABLET | Freq: Two times a day (BID) | ORAL | 0 refills | Status: DC | PRN

## 2022-02-24 MED ORDER — ONDANSETRON 4 MG PO TBDP
4.0000 mg | ORAL_TABLET | Freq: Once | ORAL | Status: DC
  Filled 2022-02-24: qty 1

## 2022-02-24 MED ORDER — ONDANSETRON 4 MG PO TBDP
4.0000 mg | ORAL_TABLET | Freq: Once | ORAL | Status: AC
  Administered 2022-02-24: 4 mg via ORAL
  Filled 2022-02-24: qty 1

## 2022-02-24 MED ORDER — ONDANSETRON HCL 4 MG PO TABS: 4.0000 mg | ORAL_TABLET | Freq: Three times a day (TID) | ORAL | 0 refills | Status: DC | PRN

## 2022-02-24 MED ORDER — INSULIN GLARGINE-YFGN 100 UNIT/ML ~~LOC~~ SOLN
3.0000 [IU] | Freq: Every day | SUBCUTANEOUS | Status: DC
  Filled 2022-02-24: qty 0.03

## 2022-02-24 MED ORDER — GLUCOSE 15 GM/32ML PO GEL: ORAL | 0 refills | Status: DC

## 2022-02-24 NOTE — TOC Transition Note (Signed)
Transition of Care [facility]) - CM/SW Discharge Note   Patient Details  Name: Ann Robertson MRN: [id number] Date of Birth: [birth year]  [facility] CM/SW Contact:  Cyndi Bender, RN Phone Number: 02/24/2022, 5:11 PM   Clinical Narrative:    Patient is stable for discharge. Patient follows up with [facility] medical PCP. Patient can afford her medications. Patient has transportation to apts. Finance can transport home.  No other TOC needs at this time.   Final next level of care: Home/Self Care Barriers to Discharge: Barriers Resolved   Patient Goals and CMS Choice Patient states their goals for this hospitalization and ongoing recovery are:: return home      Discharge Placement                   home    Discharge Plan and Services          home                           Social Determinants of Health (SDOH) Interventions     Readmission Risk Interventions     No data to display

## 2022-02-24 NOTE — Progress Notes (Deleted)
Pt's CBG before HD was 140

## 2022-02-24 NOTE — Care Management Important Message (Signed)
Important Message  Patient Details  Name: Ann Robertson MRN: [id number] Date of Birth: 01-27-1991   Medicare Important Message Given:  Yes     Tiane Szydlowski 02/24/2022, 1:36 PM

## 2022-02-24 NOTE — Progress Notes (Signed)
Pt stable for HD no c/os VSS UFG 3L

## 2022-02-24 NOTE — Plan of Care (Signed)
°  Problem: Education: °Goal: Ability to describe self-care measures that may prevent or decrease complications (Diabetes Survival Skills Education) will improve °Outcome: Progressing °Goal: Individualized Educational Video(s) °Outcome: Progressing °  °Problem: Cardiac: °Goal: Ability to maintain an adequate cardiac output will improve °Outcome: Progressing °  °Problem: Health Behavior/Discharge Planning: °Goal: Ability to identify and utilize available resources and services will improve °Outcome: Progressing °  °

## 2022-02-24 NOTE — Progress Notes (Signed)
Pt's CBG at 0840  was 140

## 2022-02-24 NOTE — Progress Notes (Signed)
CBG now 4 am 101 and 5am 120. She is feeling better. SRP, RN

## 2022-02-24 NOTE — Progress Notes (Signed)
Received patient in bed to unit.  Alert and oriented.  Informed consent signed and in chart.     02/24/22 1354  Vitals  Temp 97.9 F (36.6 C)  Temp Source Oral  BP (!) 180/91  MAP (mmHg) 118  BP Location Right Arm  BP Method Automatic  Patient Position (if appropriate) Lying  Pulse Rate (!) 105  Pulse Rate Source Monitor  ECG Heart Rate (!) 105  Resp 10  Oxygen Therapy  SpO2 100 %  Post Treatment  Dialyzer Clearance Clear  Duration of HD Treatment -hour(s) 4 hour(s)  Liters Processed 96  Fluid Removed 3000 mL  Tolerated HD Treatment Yes  AVG/AVF Arterial Site Held (minutes) 10 minutes  AVG/AVF Venous Site Held (minutes) 10 minutes  Fistula / Graft Left Upper arm Arteriovenous vein graft  Placement Date/Time: 06/19/21 0850   Placed prior to admission: No  Orientation: Left  Access Location: Upper arm  Access Type: Arteriovenous vein graft  Expiration Date: 02/15/26  Site Condition No complications  Fistula / Graft Assessment Present;Thrill;Bruit  Status Deaccessed     Patient tolerated well.  Transported back to the room  Alert, without acute distress.  Hand-off given to patient's nurse.   Post wgt 56.5kg   Na'Shaminy T R[facility]on [facility]

## 2022-02-24 NOTE — Progress Notes (Signed)
6 40am  CBG 122. SRP, RN

## 2022-02-24 NOTE — Progress Notes (Signed)
[facility] Progress Note   Subjective: Seen on HD. Discharge delayed D/T issues with hypoglycemia. Hoping to go home post HD. K+ 6.4 this AM  Objective Vitals:   02/24/22 1100 02/24/22 1130 02/24/22 1200 02/24/22 1230  BP: (!) 173/106 (!) 174/99 (!) 126/103 (!) 175/112  Pulse: (!) 111 93 93 97  Resp: (!) 21 11 10 13   Temp:      TempSrc:      SpO2: 100% 100% 100% 100%  Weight:      Height:       General: Chronically ill appearing female who looks older than stated age.  Lungs: Clear bilaterally to auscultation without wheezes, rales, or rhonchi. Breathing is unlabored. Heart: RRR with S1 S2. No murmurs, rubs, or gallops appreciated. Abdomen: Soft, non-tender, non-distended with normoactive bowel sounds. No rebound/guarding. No obvious abdominal masses. Lower extremities: BLE trace edema posterior calves.  Neuro: Alert and oriented X 3. Moves all extremities spontaneously. Dialysis Access: L AVG + T/B   Additional Objective Labs: Basic Metabolic Panel: Recent Labs  Lab 02/22/22 1235 02/22/22 2214 02/23/22 0351 02/23/22 1701 02/24/22 1011  NA 130*  129*   < > 127* 130* 127*  K 4.1  4.3   < > 5.9* 4.5 6.4*  CL 94*  91*   < > 91* 97* 92*  CO2 25  27   < > 26 24 24   GLUCOSE 70  73   < > 219* 85 193*  BUN 34*  34*   < > 30* 33* 41*  CREATININE 4.34*  4.42*   < > 4.11* 4.04* 4.93*  CALCIUM 8.2*  8.3*   < > 8.1* 7.2* 7.8*  PHOS 3.0  --   --  3.2 4.6   < > = values in this interval not displayed.   Liver Function Tests: Recent Labs  Lab 02/21/22 1611 02/22/22 1235 02/23/22 0351 02/23/22 1701 02/24/22 1011  AST 50* 102* 171*  --   --   ALT 27 32 48*  --   --   ALKPHOS 242* 234* 274*  --   --   BILITOT 1.1 1.2 1.2  --   --   PROT 5.9* 5.2* 5.7*  --   --   ALBUMIN 2.7* 2.5*  2.5* 2.6* 2.3* 2.5*   Recent Labs  Lab 02/21/22 1611  LIPASE 24   CBC: Recent Labs  Lab 02/21/22 1611 02/21/22 1639 02/22/22 1235 02/23/22 0351 02/24/22 1011   WBC 4.6  --  5.2 4.4 4.0  NEUTROABS 3.3  --   --   --   --   HGB 8.9*   < > 10.2* 9.9* 9.7*  HCT 29.3*   < > 31.3* 28.7* 29.8*  MCV 96.1  --  87.7 85.7 89.2  PLT 201  --  215 158 174   < > = values in this interval not displayed.   Blood Culture    Component Value Date/Time   SDES  04/19/2021 1024    BLOOD BLOOD RIGHT HAND Performed at [facility], [place] [address] [place], [place] [address]    SDES  04/19/2021 1024    BLOOD BLOOD RIGHT WRIST Performed at [place] [address] [place], [place] [address]    [place]  04/19/2021 1024    BOTTLES DRAWN AEROBIC ONLY Blood Culture adequate volume Performed at [place] [address] [place], [place] [address]    SPECREQUEST  04/19/2021 1024    BOTTLES  DRAWN AEROBIC ONLY Blood Culture adequate volume Performed at [place] [address] [place], [place] [address]    CULT  04/19/2021 1024    NO GROWTH 5 DAYS Performed at [place] [address], [place], [place] [address]    CULT  04/19/2021 1024    NO GROWTH 5 DAYS Performed at [place] [address], [place], [place] [address]    REPTSTATUS 04/24/2021 FINAL 04/19/2021 1024   REPTSTATUS 04/24/2021 FINAL 04/19/2021 1024    Cardiac Enzymes: No results for input(s): "CKTOTAL", "CKMB", "CKMBINDEX", "TROPONINI" in the last 168 hours. CBG: Recent Labs  Lab 02/24/22 0542 02/24/22 0644 02/24/22 0735 02/24/22 1006 02/24/22 1102  GLUCAP 120* 122* 127* 170* 121*   Iron Studies:  Recent Labs    02/22/22 0148  IRON 79  TIBC 162*  FERRITIN 1,453*   @lablastinr3 @ Studies/Results: No results found. Medications:   (feeding supplement) PROSource Plus  30 mL Oral BID BM   Chlorhexidine Gluconate Cloth  6 each Topical Q0600   darbepoetin (ARANESP) injection - DIALYSIS  40 mcg Intravenous Q Mon-HD   doxercalciferol  1 mcg Intravenous Q M,W,F-HD   feeding  supplement (NEPRO CARB STEADY)  237 mL Oral BID BM   gabapentin  100 mg Oral BID   heparin  5,000 Units Subcutaneous Q8H   insulin aspart  0-6 Units Subcutaneous TID WC   nystatin  5 mL Oral QID     Dialysis Orders: Center: [facility] MWF 4 hr 180NRe 400/500 50.8 kg 2.0 K/2.0 Ca AVG -Heparin 2500 units IV TIW -Hectorol 1 mcg IV TIW - Mircera 50 mcg IV q 2 weeks (last dose 75 mcg IV 01/25/2022) - Venofer 50 mg IV weekly     Assessment/Plan:  HHS-per primary. Now issues with hypoglycemia. Was discharged 09/26 but held over night D/T issues with low BS.   Recurrent hyperkalemia: She is being given OJ. Discussed at length with patient.   Upper extremity spasms-W/U per primary  Mild transaminitis-per primary  ESRD -  MWF HD today on schedule  Hypertension/volume  - No Net UF or post wt documented from HD 02/23/2022. Order daily wts. Weight is 58.2 kg today. LE edema present. BP elevated. UF as tolerated. Get standing post wt  Anemia  - HGB 9.7. No recent ESA doses at OP center. Given Aranesp 40 mcg IV 94/17/4081   Metabolic bone disease -  OP labs at goal. Continue velphoro binders, VDRA  Nutrition - Albumin 2.5. Add protein supplements.    Rita H. Brown NP-C 02/24/2022, 1:15 PM  [employer] [phone]  I have personally seen and examined this patient and agree with the assessment/plan as outlined above.  K is 6.4 pre-HD.  Has been drinking orange juice.  Discussed low K diet.  From renal perspective- can d/c after HD.  If K+ still up pre-HD as OP- will need lokelma on non-dialysis days Mossie Gilder,MD 02/24/2022 4:29 PM

## 2022-02-24 NOTE — Progress Notes (Signed)
Juanell Fairly NP confirmed pt hep B antigen is due to recent vaccination confirmed by ID MD ordered not to redraw labs charge RN informed and documented

## 2022-02-24 NOTE — Plan of Care (Signed)
  Problem: Education: Goal: Ability to describe self-care measures that may prevent or decrease complications (Diabetes Survival Skills Education) will improve Outcome: Progressing Goal: Individualized Educational Video(s) Outcome: Progressing   Problem: Metabolic: Goal: Ability to maintain appropriate glucose levels will improve Outcome: Progressing   Problem: Nutritional: Goal: Maintenance of adequate nutrition will improve Outcome: Progressing

## 2022-02-24 NOTE — Progress Notes (Addendum)
Pt blood sugar taking during the the night as ordered, pt glucose results, steady on trend ( SEE results) with the no symptoms until approx. 3 am requested apple juice, CBG taken 15 minutes post and current reading 102, MD updated will cont plan of care and monitor closely as ordered. SRP, RN

## 2022-02-24 NOTE — Progress Notes (Signed)
CBG 101, resting no symptoms of hypoglycemia, per MD CBG GOAL: above 100...Marland Kitchenwill cont to monitor.SRP,RN

## 2022-02-24 NOTE — Progress Notes (Signed)
Noted pt unable to d/c yesterday as planned. Contacted [place] to advise clinic staff that pt did not d/c and will not be at treatment today as planned. Contacted inpt HD unit to inquire which shift pt scheduled to receive HD today in an attempt to avoid day of d/c HD if pt to d/c today. Advised pt is in route to unit for HD this morning. Unsure at this time of plans for pt today. Will need to await assessments from medical team. Pt's case was discussed with [facility] clinic manager this morning in regards to situation with pt's Hep B antigen result due to vaccination. Will assist as needed.   Melven Sartorius Renal Navigator [phone]

## 2022-02-24 NOTE — Progress Notes (Signed)
HD#3 Subjective:   Summary: [name] is a 31yo F with a PMHx of uncontrolled T1DM, ESRD on dialysis, M/W/F, HLD, and L arm spasms, who presented to the [facility] with worsening L arm spasms and high blood sugars  Overnight Events: Patient with multiple hypoglycemic episodes yesterday which responded to IV dextrose.  She remained alert and oriented.  Objective:  Vital signs in last 24 hours: Vitals:   02/24/22 1230 02/24/22 1330 02/24/22 1354 02/24/22 1438  BP: (!) 175/112  (!) 180/91   Pulse: 97 96 (!) 105   Resp: 13 (!) 8 10   Temp:   97.9 F (36.6 C)   TempSrc:   Oral   SpO2: 100% 100% 100%   Weight:    56.5 kg  Height:       Supplemental O2: Room Air SpO2: 100 %   Physical Exam:  Constitutional: well-appearing female sitting in Robertson bed, in no acute distress HENT: normocephalic atraumatic, mucous membranes moist Eyes: conjunctiva non-erythematous Neck: supple Cardiovascular: regular rate and rhythm, no m/r/g Pulmonary/Chest: normal work of breathing on room air Abdominal: soft, non-tender, non-distended. RUQ without pain on palpation. MSK: normal bulk and tone Neurological: alert & oriented x 3, 5/5 strength in bilateral upper and lower extremities, normal gait Skin: warm and dry Psych: Patient noticeably sad, crying during rounds when discussing diarrhea.  Filed Weights   02/21/22 1903 02/24/22 0900 02/24/22 1438  Weight: 53 kg 58.2 kg 56.5 kg     Intake/Output Summary (Last 24 hours) at 02/24/2022 1513 Last data filed at 02/24/2022 1354 Gross per 24 hour  Intake 545.19 ml  Output 3150 ml  Net -2604.81 ml   Net IO Since Admission: -429.2 mL [02/24/22 1513]  Pertinent Labs:    Latest Ref Rng & Units 02/24/2022   10:11 AM 02/23/2022    3:51 AM 02/22/2022   12:35 PM  CBC  WBC 4.0 - 10.5 K/uL 4.0  4.4  5.2   Hemoglobin 12.0 - 15.0 g/dL 9.7  9.9  10.2   Hematocrit 36.0 - 46.0 % 29.8  28.7  31.3   Platelets 150 - 400 K/uL 174  158  215         Latest Ref Rng & Units 02/24/2022   10:11 AM 02/23/2022    5:01 PM 02/23/2022    3:51 AM  CMP  Glucose 70 - 99 mg/dL 193  85  219   BUN 6 - 20 mg/dL 41  33  30   Creatinine 0.44 - 1.00 mg/dL 4.93  4.04  4.11   Sodium 135 - 145 mmol/L 127  130  127   Potassium 3.5 - 5.1 mmol/L 6.4  4.5  5.9   Chloride 98 - 111 mmol/L 92  97  91   CO2 22 - 32 mmol/L 24  24  26    Calcium 8.9 - 10.3 mg/dL 7.8  7.2  8.1   Total Protein 6.5 - 8.1 g/dL   5.7   Total Bilirubin 0.3 - 1.2 mg/dL   1.2   Alkaline Phos 38 - 126 U/L   274   AST 15 - 41 U/L   171   ALT 0 - 44 U/L   48     Imaging: No results found.  Assessment/Plan:   Principal Problem:   Hyperosmolar hyperglycemic state (HHS) Ann Robertson)   Patient Summary: Ann Robertson is a 31 y.o. person living with a history of HTN, uncontrolled type 1 diabetes mellitus, ESRD on dialysis (M/W/F),  L arm spasms who presented with worsening L arm spasms and hyperglycemia and admitted for  diabetic hyperglycemic hyperosmolar syndrome.   HHS Uncontrolled type 1 DM with nephropathy Pseudohyponatremia Patient had multiple hypoglycemic episodes yesterday evening requiring IV dextrose.  This was after only receiving 5 units of Semglee and eating 75% of her meal.  She is very sensitive to insulin.  Will hold insulin for now and reach out to her endocrinologist for recommendations.  Diabetes educator to help set up CGM tomorrow morning before discharge. -cBG q4 hr -replete K if <3.0 -Hold Semglee -received and educated on CGM   ESRD Patient with ESRD on HD M/W/F patient followed outpatient by Dr. Hollie Robertson at [facility].  LUE AV fistula since January 2023 for progressive CKD possibly secondary to uncontrolled T1DM and HTN but no biopsy on file.  Before admission, patient had last been dialyzed on 9/22. Tolerating inpatient HD MWF. -Dialysis per nephrology -Monitor electrolytes -Strict I/Os   Upper extremity spasms Resolved.   Mild  transaminits Increased Alk Phos Elevated alk phos thought to be secondary to ESRD and disturbances in bone mineralization.  However GGT found to be elevated to 271 prompting consideration for hepatobiliary etiology. RUQ [place] 9/25 with slightly nodular hepatic margins suggesting cirrhosis. Hep B surface antigen positive but Ab negative. -Continue to monitor for symptoms   Anemia of chronic disease Patient presents with normocytic anemia in the setting of ESRD.  History of hypoproliferative reticulocytes.  Recent dialysis note mentions starting ESA.  Hgb 8.9 on admission. Hgb 9.7 today.  -management per nephrology  Oropharyngeal candidiasis Ann Robertson plaques on tongue surface at admission consistent with candidiasis.  Previously treated with Diflucan. -Nystatin suspension   Hypothyroidism On Synthroid 25 mcg at home.  Last TSH 4.19.  Repeat on admission 2.08. -Continue home Levothyroxine 25 mcg   Chronic pain syndrome Patient is on oxycodone-acetaminophen 10-325 mg every 4 hours and methocarbamol 750 mg every 8 hours.  She sees pain management at [facility].  Patient denies pain currently. -We will hold home pain medications for now   Hyperlipidemia Patient placed on Crestor 20 mg at last Robertson discharge but this is not on her home med list.  No lipid panel on file. -Repeat lipid panel  Diarrhea  Patient has chronic diarrhea with incontinence.  She treats this with Imodium as needed at home.  She has a history of EGD and colonoscopy but this was prior to onset of symptoms.  No concerns for infectious etiology at this point.  Question possibility of pancreatic insufficiency vs autoimmune colitis. -Imodium -fecal elastase   Diet: NPO VTE: Heparin IVF: 125cc/hr LR until BG <250 Code: Full   Prior to Admission Living Arrangement: Home, living family Anticipated Discharge Location: Home Barriers to Discharge: Clinical improvement   Dispo: Admit patient to Inpatient with  expected length of stay greater than 2 midnights.  Ann Natal MD Internal Medicine Resident PGY-1 Please contact the on call pager after 5 pm and on weekends at [phone].

## 2022-02-25 ENCOUNTER — Telehealth: Payer: Self-pay | Admitting: Nurse Practitioner

## 2022-02-25 LAB — GLUCOSE, CAPILLARY: Glucose-Capillary: 140 mg/dL — ABNORMAL HIGH (ref 70–99)

## 2022-02-25 LAB — RETICULIN ANTIBODIES, IGA W TITER: Reticulin Ab, IgA: NEGATIVE titer (ref <2.5)

## 2022-02-25 NOTE — Telephone Encounter (Signed)
Transition of care contact from inpatient facility  Date of discharge: 02/24/2022  Date of contact: 02/25/2022 Method: Phone Spoke to: Patient  Patient contacted to discuss transition of care from recent inpatient hospitalization. Patient was admitted to [facility] from 09/24-09/27/202with discharge diagnosis of HHS/uncontrolled DMT1. Please note that patient's discharge is dated 02/23/2022 but patient did not leave hospital until 02/24/2022 due to issues with hypoglycemia/hyperkalemia.   Medication changes were reviewed. Patient reminded to not drink orange juice!  Patient will follow up with his/her outpatient HD unit on: 02/25/2022.

## 2022-02-25 NOTE — Progress Notes (Signed)
Late Note Entry:  Pt was d/c late yesterday afternoon. Contacted [facility] this morning to advise clinic of pt's d/c and that pt will resume care tomorrow.   Melven Sartorius Renal Navigator [phone]

## 2022-02-26 LAB — TISSUE TRANSGLUTAMINASE, IGA: Tissue Transglutaminase Ab, IgA: <2 U/mL (ref 0–3)

## 2022-03-01 LAB — PANCREATIC ELASTASE, FECAL: Pancreatic Elastase-1, Stool: 333 ug Elast./g (ref >200)

## 2022-03-04 NOTE — Progress Notes (Signed)
Metabolic encephalopathy at admission ruled in, resolved before discharge.

## 2022-03-04 NOTE — Progress Notes (Signed)
Type 1 HHS felt to be most likely.  She has an endocrinology consultation following discharge.

## 2022-05-28 IMAGING — CT CT ABD-PELV W/O CM
2 of 4 series · 15 of 46 positions shown, 17 images · non-contrast
Comparison: Abdominopelvic CT 6 days ago 12/16/2020

CLINICAL DATA: Pelvic pain, negative beta-HCG, gyn etiology
suspected vulvar cellulitis, tender. unable to use contrast

EXAM:
CT ABDOMEN AND PELVIS WITHOUT CONTRAST
TECHNIQUE: Multidetector CT imaging of the abdomen and pelvis was performed
following the standard protocol without IV contrast.

[Series 2: axial st · axial · 0.83mm/px · z∈[-481,-86]mm · 12 of 93 slices shown, 14 images]
[im 7/93  soft-tissue]
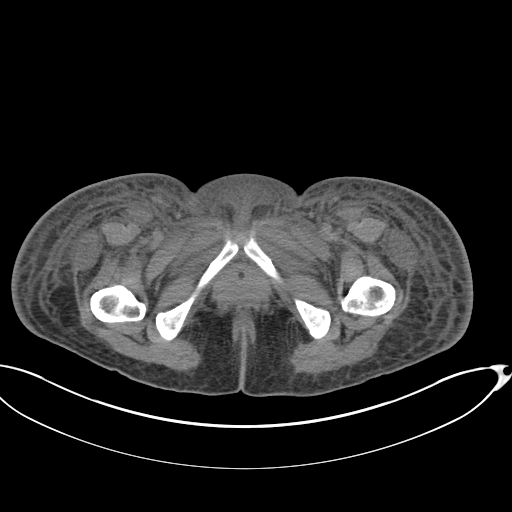
[im 7/93  bone]
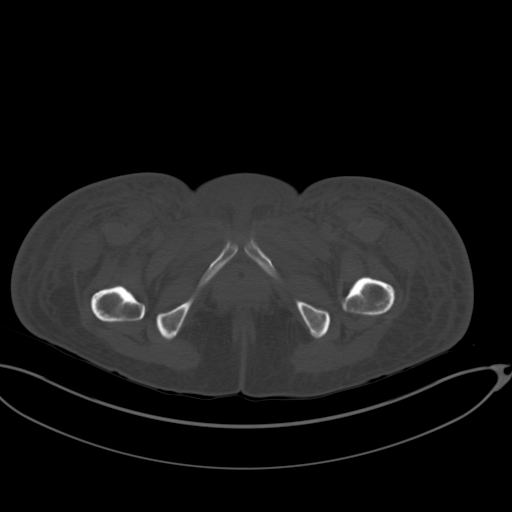
[im 13/93  soft-tissue]
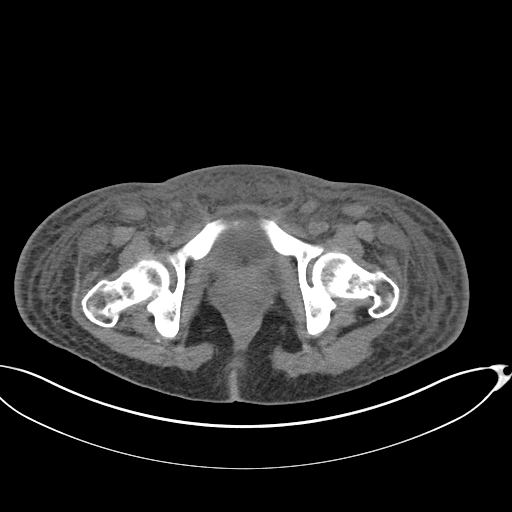
[im 19/93  soft-tissue]
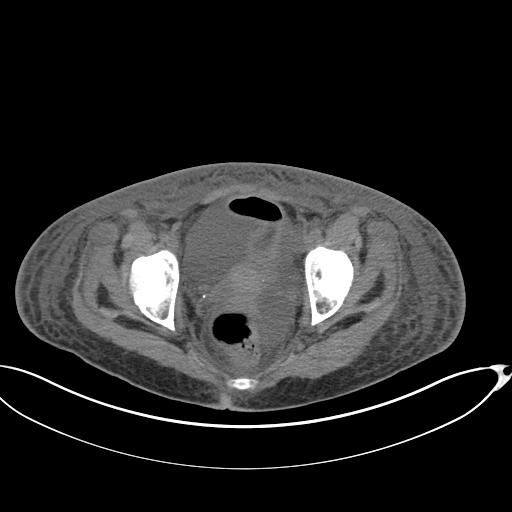
[im 31/93  soft-tissue]
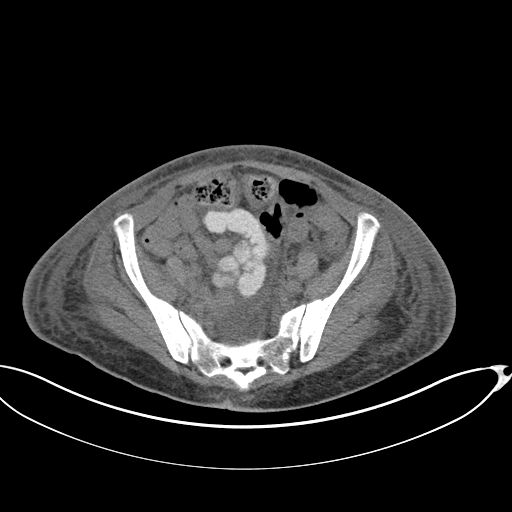
[im 37/93  soft-tissue]
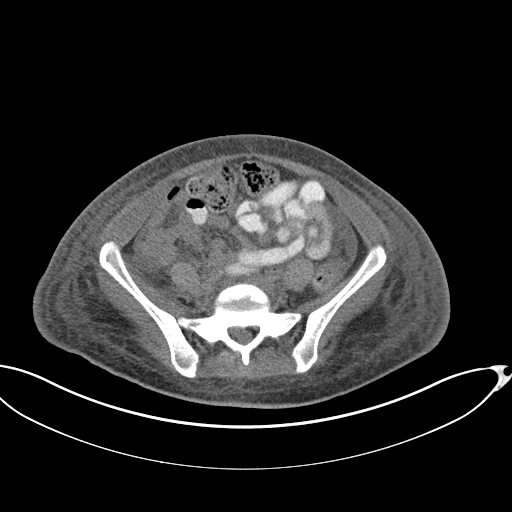
[im 43/93  soft-tissue]
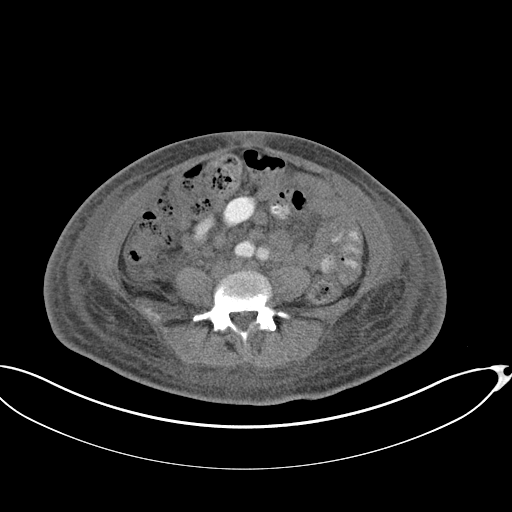
[im 50/93  soft-tissue]
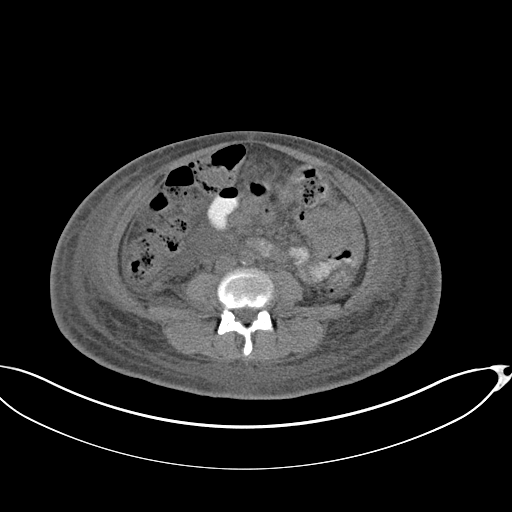
[im 56/93  soft-tissue]
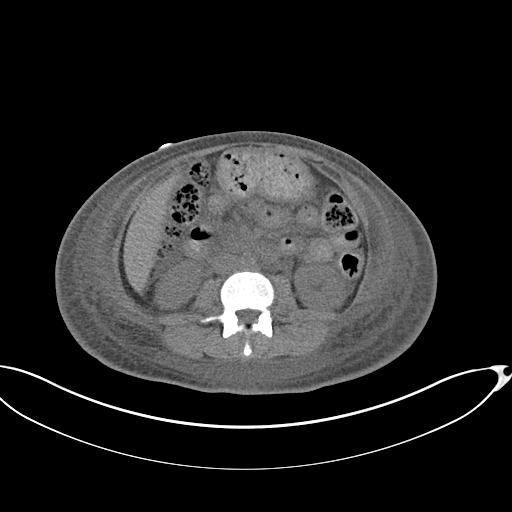
[im 62/93  soft-tissue]
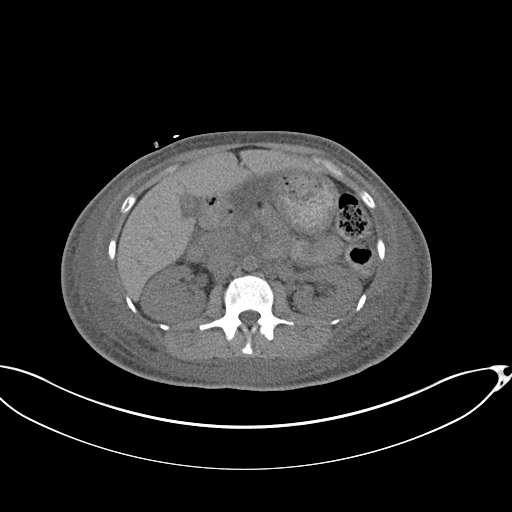
[im 62/93  bone]
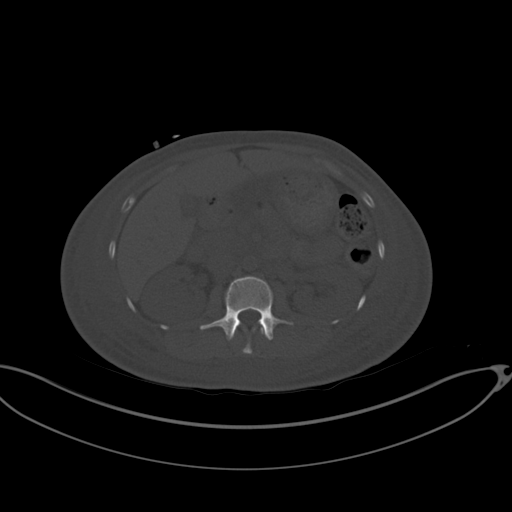
[im 74/93  soft-tissue]
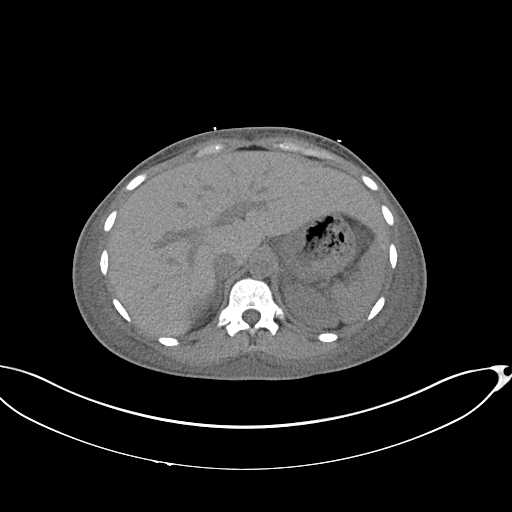
[im 80/93  soft-tissue]
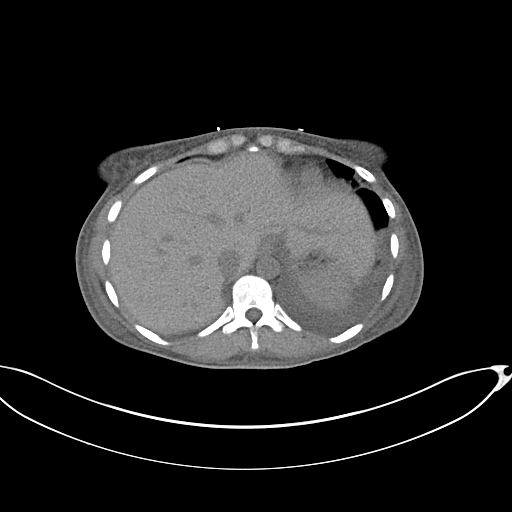
[im 86/93  soft-tissue]
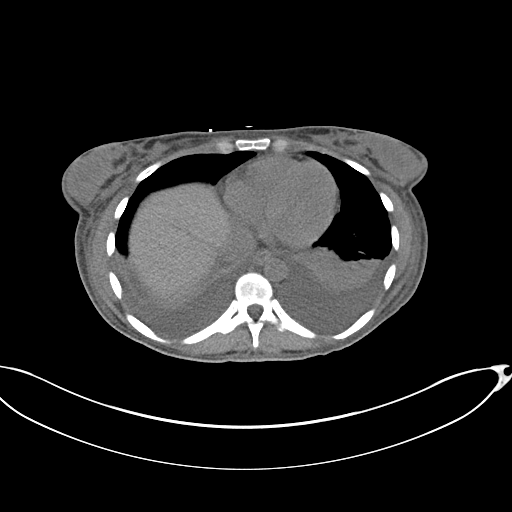

[Series 4: coronal st · coronal · 0.79mm/px · 3 of 148 slices shown]
[im 50/148  soft-tissue]
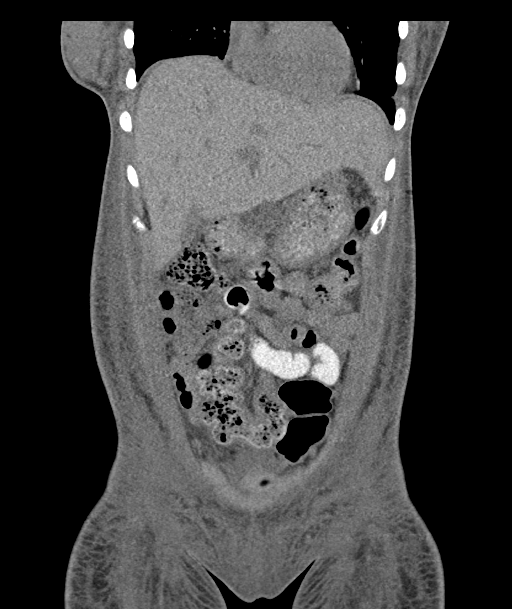
[im 66/148  soft-tissue]
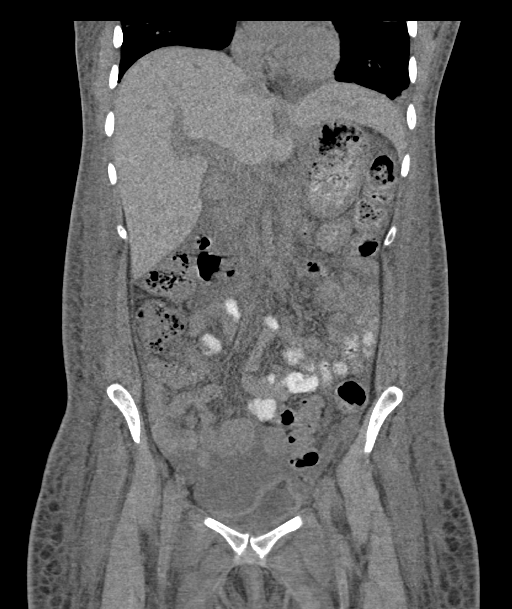
[im 82/148  soft-tissue]
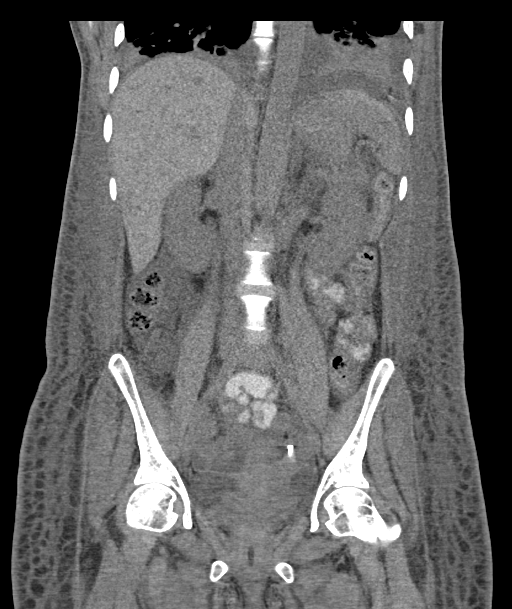

[15 of 46 positions shown; findings below may reference images not displayed]

FINDINGS: Lower chest: Small to moderate bilateral pleural effusions have
slightly increased in size from prior exam. Streaky and patchy
opacities in both lower lobes, now having the appearance of
atelectasis, the ground-glass opacities have coalesced. Normal heart
size.

Hepatobiliary: No focal liver abnormality on this unenhanced exam.
Decompressed gallbladder. No calcified gallstone.

Pancreas: Poorly defined but no evidence of inflammation. No ductal
dilatation.

Spleen: Normal in size without focal abnormality.

Adrenals/Urinary Tract: Normal adrenal glands. No hydronephrosis. No
renal calculi. Focal renal abnormality is seen. Foley catheter
within the urinary bladder which is minimally distended.

Stomach/Bowel: Ingested material within the stomach. There is no
small bowel obstruction. No obvious bowel inflammation. Moderate
stool in the right and transverse colon. Normal appendix.

Vascular/Lymphatic: Aortic atherosclerosis is mild but age advanced.
No portal venous or mesenteric gas. No bulky abdominopelvic
adenopathy.

Reproductive: Unremarkable uterus with tubal ligation clips in
place. The ovaries are not discretely identified, however there is
no obvious adnexal mass. Generalized body wall edema includes the
included labia, without focal fluid collection or soft tissue gas.

Other: Generalized body wall edema and skin thickening, mild
progression from prior exam. Small volume abdominopelvic ascites. No
free intra-abdominal air. No circumscribed fluid collection.

Musculoskeletal: No acute osseous abnormalities are seen.
IMPRESSION: 1. Generalized body wall edema and skin thickening, mild progression
from prior exam. Small volume abdominopelvic ascites. Findings
consistent with third-spacing.
2. Small to moderate bilateral pleural effusions have slightly
increased in size from prior exam. Streaky and patchy opacities in
both lower lobes, now having the appearance of atelectasis, the
ground-glass opacities have coalesced.
3. No discrete gyn pathology demonstrated by CT. Edema of the labia
appears similar to the diffuse generalized edema of the whole-body.
There is no soft tissue gas or evidence of focal fluid collection.

Aortic Atherosclerosis ([id number]).

## 2022-06-03 ENCOUNTER — Emergency Department (HOSPITAL_COMMUNITY): Payer: Medicare Other

## 2022-06-03 ENCOUNTER — Inpatient Hospital Stay (HOSPITAL_COMMUNITY)
Admission: EM | Admit: 2022-06-03 | Discharge: 2022-06-09 | DRG: 637 | Disposition: A | Discharge status: Home / Self Care | Payer: Medicare Other | Attending: Internal Medicine | Admitting: Internal Medicine

## 2022-06-03 DIAGNOSIS — Z1152 Encounter for screening for COVID-19: Secondary | ICD-10-CM

## 2022-06-03 DIAGNOSIS — E1022 Type 1 diabetes mellitus with diabetic chronic kidney disease: Secondary | ICD-10-CM | POA: Diagnosis present

## 2022-06-03 DIAGNOSIS — E1110 Type 2 diabetes mellitus with ketoacidosis without coma: Secondary | ICD-10-CM | POA: Diagnosis not present

## 2022-06-03 DIAGNOSIS — Z681 Body mass index (BMI) 19 or less, adult: Secondary | ICD-10-CM

## 2022-06-03 DIAGNOSIS — J90 Pleural effusion, not elsewhere classified: Secondary | ICD-10-CM | POA: Diagnosis present

## 2022-06-03 DIAGNOSIS — E1010 Type 1 diabetes mellitus with ketoacidosis without coma: Secondary | ICD-10-CM | POA: Diagnosis not present

## 2022-06-03 DIAGNOSIS — N186 End stage renal disease: Secondary | ICD-10-CM

## 2022-06-03 DIAGNOSIS — Z841 Family history of disorders of kidney and ureter: Secondary | ICD-10-CM

## 2022-06-03 DIAGNOSIS — Z79899 Other long term (current) drug therapy: Secondary | ICD-10-CM

## 2022-06-03 DIAGNOSIS — I120 Hypertensive chronic kidney disease with stage 5 chronic kidney disease or end stage renal disease: Secondary | ICD-10-CM | POA: Diagnosis present

## 2022-06-03 DIAGNOSIS — I517 Cardiomegaly: Secondary | ICD-10-CM | POA: Diagnosis present

## 2022-06-03 DIAGNOSIS — Z885 Allergy status to narcotic agent status: Secondary | ICD-10-CM

## 2022-06-03 DIAGNOSIS — E440 Moderate protein-calorie malnutrition: Secondary | ICD-10-CM | POA: Insufficient documentation

## 2022-06-03 DIAGNOSIS — J111 Influenza due to unidentified influenza virus with other respiratory manifestations: Secondary | ICD-10-CM

## 2022-06-03 DIAGNOSIS — N2581 Secondary hyperparathyroidism of renal origin: Secondary | ICD-10-CM | POA: Diagnosis present

## 2022-06-03 DIAGNOSIS — L8946 Pressure-induced deep tissue damage of contiguous site of back, buttock and hip: Secondary | ICD-10-CM | POA: Diagnosis present

## 2022-06-03 DIAGNOSIS — E7800 Pure hypercholesterolemia, unspecified: Secondary | ICD-10-CM | POA: Diagnosis present

## 2022-06-03 DIAGNOSIS — Z8249 Family history of ischemic heart disease and other diseases of the circulatory system: Secondary | ICD-10-CM

## 2022-06-03 DIAGNOSIS — E10649 Type 1 diabetes mellitus with hypoglycemia without coma: Secondary | ICD-10-CM | POA: Diagnosis not present

## 2022-06-03 DIAGNOSIS — Z833 Family history of diabetes mellitus: Secondary | ICD-10-CM

## 2022-06-03 DIAGNOSIS — D631 Anemia in chronic kidney disease: Secondary | ICD-10-CM | POA: Diagnosis present

## 2022-06-03 DIAGNOSIS — Z794 Long term (current) use of insulin: Secondary | ICD-10-CM

## 2022-06-03 DIAGNOSIS — M898X9 Other specified disorders of bone, unspecified site: Secondary | ICD-10-CM | POA: Diagnosis present

## 2022-06-03 DIAGNOSIS — Z91158 Patient's noncompliance with renal dialysis for other reason: Secondary | ICD-10-CM

## 2022-06-03 DIAGNOSIS — Z87891 Personal history of nicotine dependence: Secondary | ICD-10-CM

## 2022-06-03 DIAGNOSIS — E8770 Fluid overload, unspecified: Secondary | ICD-10-CM | POA: Diagnosis present

## 2022-06-03 DIAGNOSIS — Z5320 Procedure and treatment not carried out because of patient's decision for unspecified reasons: Secondary | ICD-10-CM | POA: Diagnosis not present

## 2022-06-03 DIAGNOSIS — E871 Hypo-osmolality and hyponatremia: Secondary | ICD-10-CM | POA: Diagnosis not present

## 2022-06-03 DIAGNOSIS — G9341 Metabolic encephalopathy: Secondary | ICD-10-CM | POA: Diagnosis present

## 2022-06-03 DIAGNOSIS — Z83438 Family history of other disorder of lipoprotein metabolism and other lipidemia: Secondary | ICD-10-CM

## 2022-06-03 DIAGNOSIS — R4182 Altered mental status, unspecified: Principal | ICD-10-CM

## 2022-06-03 DIAGNOSIS — Z992 Dependence on renal dialysis: Secondary | ICD-10-CM

## 2022-06-03 DIAGNOSIS — J101 Influenza due to other identified influenza virus with other respiratory manifestations: Secondary | ICD-10-CM | POA: Diagnosis present

## 2022-06-03 LAB — COMPREHENSIVE METABOLIC PANEL
ALT: 55 U/L — ABNORMAL HIGH (ref 0–44)
AST: 177 U/L — ABNORMAL HIGH (ref 15–41)
Albumin: 2.3 g/dL — ABNORMAL LOW (ref 3.5–5.0)
Alkaline Phosphatase: 387 U/L — ABNORMAL HIGH (ref 38–126)
Anion gap: 26 — ABNORMAL HIGH (ref 5–15)
BUN: 43 mg/dL — ABNORMAL HIGH (ref 6–20)
CO2: 15 mmol/L — ABNORMAL LOW (ref 22–32)
Calcium: 7.7 mg/dL — ABNORMAL LOW (ref 8.9–10.3)
Chloride: 92 mmol/L — ABNORMAL LOW (ref 98–111)
Creatinine, Ser: 5.96 mg/dL — ABNORMAL HIGH (ref 0.44–1.00)
GFR, Estimated: 9 mL/min — ABNORMAL LOW (ref >60)
Glucose, Bld: 347 mg/dL — ABNORMAL HIGH (ref 70–99)
Potassium: 5.1 mmol/L (ref 3.5–5.1)
Sodium: 133 mmol/L — ABNORMAL LOW (ref 135–145)
Total Bilirubin: 1.7 mg/dL — ABNORMAL HIGH (ref 0.3–1.2)
Total Protein: 5.6 g/dL — ABNORMAL LOW (ref 6.5–8.1)

## 2022-06-03 LAB — I-STAT CHEM 8, ED
BUN: 41 mg/dL — ABNORMAL HIGH (ref 6–20)
Calcium, Ion: 0.9 mmol/L — ABNORMAL LOW (ref 1.15–1.40)
Chloride: 96 mmol/L — ABNORMAL LOW (ref 98–111)
Creatinine, Ser: 6.4 mg/dL — ABNORMAL HIGH (ref 0.44–1.00)
Glucose, Bld: 334 mg/dL — ABNORMAL HIGH (ref 70–99)
HCT: 44 % (ref 36.0–46.0)
Hemoglobin: 15 g/dL (ref 12.0–15.0)
Potassium: 5 mmol/L (ref 3.5–5.1)
Sodium: 130 mmol/L — ABNORMAL LOW (ref 135–145)
TCO2: 19 mmol/L — ABNORMAL LOW (ref 22–32)

## 2022-06-03 LAB — CBC WITH DIFFERENTIAL/PLATELET
Abs Immature Granulocytes: 0.08 10*3/uL — ABNORMAL HIGH (ref 0.00–0.07)
Basophils Absolute: 0 10*3/uL (ref 0.0–0.1)
Basophils Relative: 0 %
Eosinophils Absolute: 0 10*3/uL (ref 0.0–0.5)
Eosinophils Relative: 0 %
HCT: 36.4 % (ref 36.0–46.0)
Hemoglobin: 12 g/dL (ref 12.0–15.0)
Immature Granulocytes: 1 %
Lymphocytes Relative: 14 %
Lymphs Abs: 1.1 10*3/uL (ref 0.7–4.0)
MCH: 29.6 pg (ref 26.0–34.0)
MCHC: 33 g/dL (ref 30.0–36.0)
MCV: 89.9 fL (ref 80.0–100.0)
Monocytes Absolute: 0.4 10*3/uL (ref 0.1–1.0)
Monocytes Relative: 5 %
Neutro Abs: 6.3 10*3/uL (ref 1.7–7.7)
Neutrophils Relative %: 80 %
Platelets: 111 10*3/uL — ABNORMAL LOW (ref 150–400)
RBC: 4.05 MIL/uL (ref 3.87–5.11)
RDW: 15.6 % — ABNORMAL HIGH (ref 11.5–15.5)
WBC: 7.9 10*3/uL (ref 4.0–10.5)
nRBC: 1.1 % — ABNORMAL HIGH (ref 0.0–0.2)

## 2022-06-03 LAB — AMMONIA: Ammonia: 62 umol/L — ABNORMAL HIGH (ref 9–35)

## 2022-06-03 LAB — I-STAT VENOUS BLOOD GAS, ED
Acid-base deficit: 7 mmol/L — ABNORMAL HIGH (ref 0.0–2.0)
Bicarbonate: 17.9 mmol/L — ABNORMAL LOW (ref 20.0–28.0)
Calcium, Ion: 0.9 mmol/L — ABNORMAL LOW (ref 1.15–1.40)
HCT: 44 % (ref 36.0–46.0)
Hemoglobin: 15 g/dL (ref 12.0–15.0)
O2 Saturation: 76 %
Potassium: 5 mmol/L (ref 3.5–5.1)
Sodium: 130 mmol/L — ABNORMAL LOW (ref 135–145)
TCO2: 19 mmol/L — ABNORMAL LOW (ref 22–32)
pCO2, Ven: 33 mmHg — ABNORMAL LOW (ref 44–60)
pH, Ven: 7.342 (ref 7.25–7.43)
pO2, Ven: 42 mmHg (ref 32–45)

## 2022-06-03 LAB — LACTIC ACID, PLASMA: Lactic Acid, Venous: 5.7 mmol/L (ref 0.5–1.9)

## 2022-06-03 LAB — I-STAT BETA HCG BLOOD, ED (MC, WL, AP ONLY): I-stat hCG, quantitative: <5 m[IU]/mL (ref <5)

## 2022-06-03 LAB — TSH: TSH: 2.577 u[IU]/mL (ref 0.350–4.500)

## 2022-06-03 LAB — CK: Total CK: 278 U/L — ABNORMAL HIGH (ref 38–234)

## 2022-06-03 MED ORDER — SODIUM CHLORIDE 0.9 % IV BOLUS
1000.0000 mL | Freq: Once | INTRAVENOUS | Status: AC
  Administered 2022-06-03: 1000 mL via INTRAVENOUS

## 2022-06-03 NOTE — ED Provider Notes (Signed)
[facility] EMERGENCY DEPARTMENT Provider Note   CSN: [id number] Arrival date & time: 06/03/22  2138     History  Chief Complaint  Patient presents with   Altered Mental Status    Ann Robertson is a 32 y.o. female.  Pt is a 32 yo female with a pmhx significant for DM1, ESRD on HD, and HLD.  Pt is unable to give any hx.  Per EMS, family called them today because she's not gotten oob for a week.  She has skipped several days of dialysis, although we don't know how many exactly.  She has not been eating/drinking.  No meds.BP for EMS in the 80s on scene; she was given 200 cc and bp is better.  BS 418.  Pt's dad here now.  He said she missed 6 dialysis sessions, but he was able to get her to dialysis yesterday.  He said she has not been eating/drinking or taking her meds.  No fevers.         Home Medications Prior to Admission medications   Medication Sig Start Date End Date Taking? Authorizing Provider  gabapentin (NEURONTIN) 100 MG capsule Take 100 mg by mouth daily. 02/15/22   [provider]  Glucagon (BAQSIMI ONE PACK) 3 MG/DOSE POWD Place 1 each into the nose as needed. 02/24/22   Rick Duff, MD  Glucose 15 GM/32ML GEL Please use gel for low blood sugars per instructions with packaging 02/24/22   Rick Duff, MD  hydrOXYzine (ATARAX) 25 MG tablet Take 1 tablet (25 mg total) by mouth 2 (two) times daily as needed for itching. 02/24/22   Rick Duff, MD  insulin glargine (LANTUS SOLOSTAR) 100 UNIT/ML Solostar Pen Inject 3 Units into the skin at bedtime. 02/24/22   Rick Duff, MD  insulin lispro (HUMALOG) 100 UNIT/ML KwikPen Inject 1-3 Units into the skin See admin instructions. Inject 1-3 units up to three times daily with meals (only if eating) per sliding scale based on blood glucose and Carb count. 02/24/22   Rick Duff, MD  labetalol (NORMODYNE) 100 MG tablet Take 100 mg by mouth 2 (two) times daily. 03/26/21   [provider]  lidocaine-prilocaine (EMLA) cream Apply 1 Application topically as needed (For port access). 02/23/22   Rick Duff, MD  loperamide (IMODIUM) 2 MG capsule Please take one (2mg ) tablet up to four times a day for loose stools, until you have 1-2 bowel movements. Please continue this dose. If you stop having bowel movements, please stop using this medication. 02/24/22   Rick Duff, MD  methocarbamol (ROBAXIN) 750 MG tablet Take 750 mg by mouth 3 (three) times daily. 02/16/22   [provider]  omeprazole (PRILOSEC) 20 MG capsule Take 20 mg by mouth daily as needed (acid reflux/heartburn).    [provider]  ondansetron (ZOFRAN) 4 MG tablet Take 1 tablet (4 mg total) by mouth every 8 (eight) hours as needed for nausea or vomiting. 02/24/22   Rick Duff, MD  oxyCODONE-acetaminophen (PERCOCET) 10-325 MG tablet Take 1 tablet by mouth every 4 (four) hours as needed for pain. 02/13/21   [provider]  phenylephrine-shark liver oil-mineral oil-petrolatum (PREPARATION H) 0.25-14-74.9 % rectal ointment Place 1 Application rectally 2 (two) times daily as needed for hemorrhoids. 02/23/22   Rick Duff, MD      Allergies    Hydromorphone hcl    Review of Systems   Review of Systems  Unable to perform ROS: Mental status change    Physical  Exam Updated Vital Signs BP (!) 148/86   Pulse 83   Temp 98.3 F (36.8 C) (Oral)   Resp 13   SpO2 99%  Physical Exam Vitals and nursing note reviewed.  Constitutional:      Appearance: She is ill-appearing.  HENT:     Head: Normocephalic and atraumatic.     Right Ear: External ear normal.     Left Ear: External ear normal.     Nose: Nose normal.     Mouth/Throat:     Mouth: Mucous membranes are dry.  Eyes:     Pupils: Pupils are equal, round, and reactive to light.  Cardiovascular:     Rate and Rhythm: Normal rate and regular rhythm.     Pulses: Normal pulses.     Heart sounds: Normal heart  sounds.  Pulmonary:     Effort: Pulmonary effort is normal.     Breath sounds: Normal breath sounds.  Abdominal:     General: Abdomen is flat. Bowel sounds are normal.     Palpations: Abdomen is soft.  Musculoskeletal:     Cervical back: Normal range of motion and neck supple.     Comments: Pt lying on right side.  When she is moved, she says ow.  She does not want to straighten out and goes back to her right side.  Skin:    General: Skin is warm.     Capillary Refill: Capillary refill takes less than 2 seconds.  Neurological:     Mental Status: She is disoriented.  Psychiatric:        Behavior: Behavior is agitated.     ED Results / Procedures / Treatments   Labs (all labs ordered are listed, but only abnormal results are displayed) Labs Reviewed  CBC WITH DIFFERENTIAL/PLATELET - Abnormal; Notable for the following components:      Result Value   RDW 15.6 (*)    Platelets 111 (*)    nRBC 1.1 (*)    Abs Immature Granulocytes 0.08 (*)    All other components within normal limits  I-STAT CHEM 8, ED - Abnormal; Notable for the following components:   Sodium 130 (*)    Chloride 96 (*)    BUN 41 (*)    Creatinine, Ser 6.40 (*)    Glucose, Bld 334 (*)    Calcium, Ion 0.90 (*)    TCO2 19 (*)    All other components within normal limits  I-STAT VENOUS BLOOD GAS, ED - Abnormal; Notable for the following components:   pCO2, Ven 33.0 (*)    Bicarbonate 17.9 (*)    TCO2 19 (*)    Acid-base deficit 7.0 (*)    Sodium 130 (*)    Calcium, Ion 0.90 (*)    All other components within normal limits  RESP PANEL BY RT-PCR (RSV, FLU A&B, COVID)  [id number]  COMPREHENSIVE METABOLIC PANEL  URINALYSIS, ROUTINE W REFLEX MICROSCOPIC  AMMONIA  LACTIC ACID, PLASMA  URINALYSIS, ROUTINE W REFLEX MICROSCOPIC  CK  BETA-HYDROXYBUTYRIC ACID  TSH  CBG MONITORING, ED  I-STAT BETA HCG BLOOD, ED ([facility], [facility], [facility] ONLY)    EKG EKG Interpretation  Date/Time:  Thursday June 03 2022 21:55:00  EST Ventricular Rate:  88 PR Interval:  135 QRS Duration: 86 QT Interval:  451 QTC Calculation: 546 R Axis:   122 Text Interpretation: Sinus rhythm Right axis deviation Borderline T abnormalities, diffuse leads Prolonged QT interval No significant change since last tracing Confirmed by Isla Pence [phone]) on  06/03/2022 10:52:46 PM  Radiology DG Chest Portable 1 View  Result Date: 06/03/2022 CLINICAL DATA:  Altered mental status. EXAM: PORTABLE CHEST 1 VIEW COMPARISON:  02/08/2022. FINDINGS: Heart is enlarged and the mediastinal contour is within normal limits. There is a small right pleural effusion with atelectasis or infiltrate at the right lung base. Visualized portion of the left lung is clear. No pneumothorax is seen. No acute osseous abnormality. IMPRESSION: 1. Small right pleural effusion with atelectasis or infiltrate. 2. Cardiomegaly. Electronically Signed   By: Brett Fairy M.D.   On: 06/03/2022 22:36    Procedures Procedures    Medications Ordered in ED Medications  sodium chloride 0.9 % bolus 1,000 mL (1,000 mLs Intravenous New Bag/Given 06/03/22 2317)    ED Course/ Medical Decision Making/ A&P                           Medical Decision Making Amount and/or Complexity of Data Reviewed Labs: ordered. Radiology: ordered.   This patient presents to the ED for concern of ams, this involves an extensive number of treatment options, and is a complaint that carries with it a high risk of complications and morbidity.  The differential diagnosis includes infection, dka, electrolyte abn, cva   Co morbidities that complicate the patient evaluation   DM1, ESRD on HD, and HLD   Additional history obtained:  Additional history obtained from epic chart review External records from outside source obtained and reviewed including EMS report   Lab Tests:  I Ordered, and personally interpreted labs.  The pertinent results include:   CBC nl, vbg with ph 7.32; istat with k nl  at 5   Imaging Studies ordered:  I ordered imaging studies including cxr and ct head  I independently visualized and interpreted imaging which showed  CXR:  Small right pleural effusion with atelectasis or infiltrate.  2. Cardiomegaly.   I agree with the radiologist interpretation   Cardiac Monitoring:  The patient was maintained on a cardiac monitor.  I personally viewed and interpreted the cardiac monitored which showed an underlying rhythm of: nsr   Medicines ordered and prescription drug management:  I ordered medication including ivfs  for dehydration  Reevaluation of the patient after these medicines showed that the patient stayed the same I have reviewed the patients home medicines and have made adjustments as needed   Test Considered:  ct   Critical Interventions:  labs    Problem List / ED Course:  AMS:  cause is unclear at shift change.  Pt signed out to Dr. Dayna Barker.   Reevaluation:  After the interventions noted above, I reevaluated the patient and found that they have :improved   Social Determinants of Health:  Lives at home   Dispostion:  After consideration of the diagnostic results and the patients response to treatment, I feel that the patent would benefit from admission.          Final Clinical Impression(s) / ED Diagnoses Final diagnoses:  Altered mental status, unspecified altered mental status type  ESRD on hemodialysis [facility])    Rx / DC Orders ED Discharge Orders     None         Isla Pence, MD 06/03/22 2334

## 2022-06-03 NOTE — ED Notes (Signed)
Patient is able to answer more questions and express needs. Patient denies pain and is resting comfortably. NAD

## 2022-06-03 NOTE — ED Notes (Signed)
ED Provider at bedside. 

## 2022-06-03 NOTE — ED Triage Notes (Addendum)
BIB EMS from home for AMS. X1 week, unable to get out of bed not eating/drinking. Missed several; dialysis session, noncompliant with medications. Hypotensive on scene. Medic reports a new wound forming on bottom.  Patient unable to answer any questions for triage.   418 CBG

## 2022-06-04 ENCOUNTER — Emergency Department (HOSPITAL_COMMUNITY): Payer: Medicare Other

## 2022-06-04 DIAGNOSIS — I120 Hypertensive chronic kidney disease with stage 5 chronic kidney disease or end stage renal disease: Secondary | ICD-10-CM | POA: Diagnosis present

## 2022-06-04 DIAGNOSIS — Z794 Long term (current) use of insulin: Secondary | ICD-10-CM | POA: Diagnosis not present

## 2022-06-04 DIAGNOSIS — E1022 Type 1 diabetes mellitus with diabetic chronic kidney disease: Secondary | ICD-10-CM | POA: Diagnosis present

## 2022-06-04 DIAGNOSIS — J09X1 Influenza due to identified novel influenza A virus with pneumonia: Secondary | ICD-10-CM

## 2022-06-04 DIAGNOSIS — J101 Influenza due to other identified influenza virus with other respiratory manifestations: Secondary | ICD-10-CM | POA: Diagnosis present

## 2022-06-04 DIAGNOSIS — E1110 Type 2 diabetes mellitus with ketoacidosis without coma: Secondary | ICD-10-CM | POA: Diagnosis present

## 2022-06-04 DIAGNOSIS — E871 Hypo-osmolality and hyponatremia: Secondary | ICD-10-CM | POA: Diagnosis not present

## 2022-06-04 DIAGNOSIS — D631 Anemia in chronic kidney disease: Secondary | ICD-10-CM | POA: Diagnosis present

## 2022-06-04 DIAGNOSIS — E1011 Type 1 diabetes mellitus with ketoacidosis with coma: Secondary | ICD-10-CM | POA: Diagnosis not present

## 2022-06-04 DIAGNOSIS — E440 Moderate protein-calorie malnutrition: Secondary | ICD-10-CM | POA: Diagnosis present

## 2022-06-04 DIAGNOSIS — N2581 Secondary hyperparathyroidism of renal origin: Secondary | ICD-10-CM | POA: Diagnosis present

## 2022-06-04 DIAGNOSIS — L8946 Pressure-induced deep tissue damage of contiguous site of back, buttock and hip: Secondary | ICD-10-CM | POA: Diagnosis present

## 2022-06-04 DIAGNOSIS — Z91158 Patient's noncompliance with renal dialysis for other reason: Secondary | ICD-10-CM | POA: Diagnosis not present

## 2022-06-04 DIAGNOSIS — E10649 Type 1 diabetes mellitus with hypoglycemia without coma: Secondary | ICD-10-CM | POA: Diagnosis not present

## 2022-06-04 DIAGNOSIS — Z87891 Personal history of nicotine dependence: Secondary | ICD-10-CM | POA: Diagnosis not present

## 2022-06-04 DIAGNOSIS — E7800 Pure hypercholesterolemia, unspecified: Secondary | ICD-10-CM | POA: Diagnosis present

## 2022-06-04 DIAGNOSIS — G9341 Metabolic encephalopathy: Secondary | ICD-10-CM

## 2022-06-04 DIAGNOSIS — Z992 Dependence on renal dialysis: Secondary | ICD-10-CM | POA: Diagnosis not present

## 2022-06-04 DIAGNOSIS — N186 End stage renal disease: Secondary | ICD-10-CM

## 2022-06-04 DIAGNOSIS — M898X9 Other specified disorders of bone, unspecified site: Secondary | ICD-10-CM | POA: Diagnosis present

## 2022-06-04 DIAGNOSIS — I517 Cardiomegaly: Secondary | ICD-10-CM | POA: Diagnosis present

## 2022-06-04 DIAGNOSIS — Z1152 Encounter for screening for COVID-19: Secondary | ICD-10-CM | POA: Diagnosis not present

## 2022-06-04 DIAGNOSIS — J90 Pleural effusion, not elsewhere classified: Secondary | ICD-10-CM | POA: Diagnosis present

## 2022-06-04 DIAGNOSIS — E1010 Type 1 diabetes mellitus with ketoacidosis without coma: Secondary | ICD-10-CM

## 2022-06-04 DIAGNOSIS — Z681 Body mass index (BMI) 19 or less, adult: Secondary | ICD-10-CM | POA: Diagnosis not present

## 2022-06-04 DIAGNOSIS — E8770 Fluid overload, unspecified: Secondary | ICD-10-CM | POA: Diagnosis present

## 2022-06-04 DIAGNOSIS — Z8249 Family history of ischemic heart disease and other diseases of the circulatory system: Secondary | ICD-10-CM | POA: Diagnosis not present

## 2022-06-04 HISTORY — DX: Type 2 diabetes mellitus with ketoacidosis without coma: E11.10

## 2022-06-04 LAB — RENAL FUNCTION PANEL
Albumin: 2.3 g/dL — ABNORMAL LOW (ref 3.5–5.0)
Anion gap: 19 — ABNORMAL HIGH (ref 5–15)
BUN: 44 mg/dL — ABNORMAL HIGH (ref 6–20)
CO2: 18 mmol/L — ABNORMAL LOW (ref 22–32)
Calcium: 7.6 mg/dL — ABNORMAL LOW (ref 8.9–10.3)
Chloride: 96 mmol/L — ABNORMAL LOW (ref 98–111)
Creatinine, Ser: 5.7 mg/dL — ABNORMAL HIGH (ref 0.44–1.00)
GFR, Estimated: 9 mL/min — ABNORMAL LOW (ref >60)
Glucose, Bld: 110 mg/dL — ABNORMAL HIGH (ref 70–99)
Phosphorus: 5.8 mg/dL — ABNORMAL HIGH (ref 2.5–4.6)
Potassium: 3.6 mmol/L (ref 3.5–5.1)
Sodium: 133 mmol/L — ABNORMAL LOW (ref 135–145)

## 2022-06-04 LAB — BASIC METABOLIC PANEL
Anion gap: 19 — ABNORMAL HIGH (ref 5–15)
Anion gap: 10 (ref 5–15)
BUN: 41 mg/dL — ABNORMAL HIGH (ref 6–20)
BUN: 20 mg/dL (ref 6–20)
CO2: 17 mmol/L — ABNORMAL LOW (ref 22–32)
CO2: 26 mmol/L (ref 22–32)
Calcium: 7.2 mg/dL — ABNORMAL LOW (ref 8.9–10.3)
Calcium: 7.4 mg/dL — ABNORMAL LOW (ref 8.9–10.3)
Chloride: 96 mmol/L — ABNORMAL LOW (ref 98–111)
Chloride: 98 mmol/L (ref 98–111)
Creatinine, Ser: 5.4 mg/dL — ABNORMAL HIGH (ref 0.44–1.00)
Creatinine, Ser: 3.57 mg/dL — ABNORMAL HIGH (ref 0.44–1.00)
GFR, Estimated: 10 mL/min — ABNORMAL LOW (ref >60)
GFR, Estimated: 17 mL/min — ABNORMAL LOW (ref >60)
Glucose, Bld: 286 mg/dL — ABNORMAL HIGH (ref 70–99)
Glucose, Bld: 96 mg/dL (ref 70–99)
Potassium: 3.4 mmol/L — ABNORMAL LOW (ref 3.5–5.1)
Potassium: 3.1 mmol/L — ABNORMAL LOW (ref 3.5–5.1)
Sodium: 132 mmol/L — ABNORMAL LOW (ref 135–145)
Sodium: 134 mmol/L — ABNORMAL LOW (ref 135–145)

## 2022-06-04 LAB — MRSA NEXT GEN BY PCR, NASAL: MRSA by PCR Next Gen: NOT DETECTED

## 2022-06-04 LAB — I-STAT ARTERIAL BLOOD GAS, ED
Acid-base deficit: 3 mmol/L — ABNORMAL HIGH (ref 0.0–2.0)
Bicarbonate: 21.5 mmol/L (ref 20.0–28.0)
Calcium, Ion: 1.08 mmol/L — ABNORMAL LOW (ref 1.15–1.40)
HCT: 39 % (ref 36.0–46.0)
Hemoglobin: 13.3 g/dL (ref 12.0–15.0)
O2 Saturation: 95 %
Patient temperature: 98
Potassium: 3.3 mmol/L — ABNORMAL LOW (ref 3.5–5.1)
Sodium: 134 mmol/L — ABNORMAL LOW (ref 135–145)
TCO2: 23 mmol/L (ref 22–32)
pCO2 arterial: 34.8 mmHg (ref 32–48)
pH, Arterial: 7.396 (ref 7.35–7.45)
pO2, Arterial: 72 mmHg — ABNORMAL LOW (ref 83–108)

## 2022-06-04 LAB — GLUCOSE, CAPILLARY
Glucose-Capillary: 112 mg/dL — ABNORMAL HIGH (ref 70–99)
Glucose-Capillary: 63 mg/dL — ABNORMAL LOW (ref 70–99)
Glucose-Capillary: 115 mg/dL — ABNORMAL HIGH (ref 70–99)
Glucose-Capillary: 96 mg/dL (ref 70–99)
Glucose-Capillary: 96 mg/dL (ref 70–99)
Glucose-Capillary: 121 mg/dL — ABNORMAL HIGH (ref 70–99)

## 2022-06-04 LAB — RESP PANEL BY RT-PCR (RSV, FLU A&B, COVID)  RVPGX2
Influenza A by PCR: NEGATIVE
Influenza B by PCR: POSITIVE — AB
Resp Syncytial Virus by PCR: NEGATIVE
SARS Coronavirus 2 by RT PCR: NEGATIVE

## 2022-06-04 LAB — CBG MONITORING, ED
Glucose-Capillary: 290 mg/dL — ABNORMAL HIGH (ref 70–99)
Glucose-Capillary: 253 mg/dL — ABNORMAL HIGH (ref 70–99)
Glucose-Capillary: 299 mg/dL — ABNORMAL HIGH (ref 70–99)
Glucose-Capillary: 252 mg/dL — ABNORMAL HIGH (ref 70–99)
Glucose-Capillary: 231 mg/dL — ABNORMAL HIGH (ref 70–99)
Glucose-Capillary: 251 mg/dL — ABNORMAL HIGH (ref 70–99)
Glucose-Capillary: 189 mg/dL — ABNORMAL HIGH (ref 70–99)
Glucose-Capillary: 182 mg/dL — ABNORMAL HIGH (ref 70–99)
Glucose-Capillary: 147 mg/dL — ABNORMAL HIGH (ref 70–99)
Glucose-Capillary: 141 mg/dL — ABNORMAL HIGH (ref 70–99)

## 2022-06-04 LAB — CBC
HCT: 35.9 % — ABNORMAL LOW (ref 36.0–46.0)
Hemoglobin: 12.4 g/dL (ref 12.0–15.0)
MCH: 29.9 pg (ref 26.0–34.0)
MCHC: 34.5 g/dL (ref 30.0–36.0)
MCV: 86.5 fL (ref 80.0–100.0)
Platelets: 123 10*3/uL — ABNORMAL LOW (ref 150–400)
RBC: 4.15 MIL/uL (ref 3.87–5.11)
RDW: 15.5 % (ref 11.5–15.5)
WBC: 7.1 10*3/uL (ref 4.0–10.5)
nRBC: 1.1 % — ABNORMAL HIGH (ref 0.0–0.2)

## 2022-06-04 LAB — BETA-HYDROXYBUTYRIC ACID
Beta-Hydroxybutyric Acid: 7.37 mmol/L — ABNORMAL HIGH (ref 0.05–0.27)
Beta-Hydroxybutyric Acid: 4.89 mmol/L — ABNORMAL HIGH (ref 0.05–0.27)
Beta-Hydroxybutyric Acid: 0.17 mmol/L (ref 0.05–0.27)
Beta-Hydroxybutyric Acid: 0.09 mmol/L (ref 0.05–0.27)

## 2022-06-04 LAB — HIV ANTIBODY (ROUTINE TESTING W REFLEX): HIV Screen 4th Generation wRfx: NONREACTIVE

## 2022-06-04 MED ORDER — ORAL CARE MOUTH RINSE: 15.0000 mL | OROMUCOSAL | Status: DC | PRN

## 2022-06-04 MED ORDER — HEPARIN SODIUM (PORCINE) 5000 UNIT/ML IJ SOLN
5000.0000 [IU] | Freq: Three times a day (TID) | INTRAMUSCULAR | Status: DC
  Administered 2022-06-04 – 2022-06-06 (×6): 5000 [IU] via SUBCUTANEOUS
  Filled 2022-06-04 (×12): qty 1

## 2022-06-04 MED ORDER — DOXERCALCIFEROL 4 MCG/2ML IV SOLN
1.0000 ug | INTRAVENOUS | Status: DC
  Administered 2022-06-04: 1 ug via INTRAVENOUS
  Filled 2022-06-04 (×2): qty 2

## 2022-06-04 MED ORDER — LACTATED RINGERS IV BOLUS
20.0000 mL/kg | Freq: Once | INTRAVENOUS | Status: AC
  Administered 2022-06-04: 1134 mL via INTRAVENOUS

## 2022-06-04 MED ORDER — DEXTROSE 50 % IV SOLN
25.0000 mL | Freq: Once | INTRAVENOUS | Status: AC
  Administered 2022-06-04: 25 mL via INTRAVENOUS

## 2022-06-04 MED ORDER — CHLORHEXIDINE GLUCONATE CLOTH 2 % EX PADS
6.0000 | MEDICATED_PAD | Freq: Every day | CUTANEOUS | Status: DC
  Administered 2022-06-05 – 2022-06-08 (×5): 6 via TOPICAL

## 2022-06-04 MED ORDER — ORAL CARE MOUTH RINSE
15.0000 mL | OROMUCOSAL | Status: DC
  Administered 2022-06-04 – 2022-06-09 (×14): 15 mL via OROMUCOSAL

## 2022-06-04 MED ORDER — DEXTROSE 50 % IV SOLN
0.0000 mL | INTRAVENOUS | Status: DC | PRN
  Administered 2022-06-05 (×2): 50 mL via INTRAVENOUS
  Administered 2022-06-05 – 2022-06-06 (×3): 25 mL via INTRAVENOUS
  Filled 2022-06-04 (×5): qty 50

## 2022-06-04 MED ORDER — DEXTROSE 50 % IV SOLN
INTRAVENOUS | Status: AC
  Filled 2022-06-04: qty 50

## 2022-06-04 MED ORDER — INSULIN DETEMIR 100 UNIT/ML ~~LOC~~ SOLN
5.0000 [IU] | Freq: Two times a day (BID) | SUBCUTANEOUS | Status: DC
  Administered 2022-06-04: 5 [IU] via SUBCUTANEOUS
  Filled 2022-06-04 (×3): qty 0.05

## 2022-06-04 MED ORDER — DEXTROSE IN LACTATED RINGERS 5 % IV SOLN: INTRAVENOUS | Status: DC

## 2022-06-04 MED ORDER — OSELTAMIVIR PHOSPHATE 30 MG PO CAPS
30.0000 mg | ORAL_CAPSULE | Freq: Every day | ORAL | Status: DC
  Filled 2022-06-04 (×2): qty 1

## 2022-06-04 MED ORDER — LACTATED RINGERS IV SOLN: INTRAVENOUS | Status: DC

## 2022-06-04 MED ORDER — DEXTROSE-NACL 5-0.9 % IV SOLN: INTRAVENOUS | Status: DC

## 2022-06-04 MED ORDER — LIDOCAINE-PRILOCAINE 2.5-2.5 % EX CREA
1.0000 | TOPICAL_CREAM | CUTANEOUS | Status: DC | PRN
  Administered 2022-06-07: 1 via TOPICAL

## 2022-06-04 MED ORDER — HEPARIN SODIUM (PORCINE) 1000 UNIT/ML DIALYSIS
20.0000 [IU]/kg | INTRAMUSCULAR | Status: DC | PRN
  Administered 2022-06-04: 1100 [IU] via INTRAVENOUS_CENTRAL
  Filled 2022-06-04 (×2): qty 2

## 2022-06-04 MED ORDER — INSULIN ASPART 100 UNIT/ML IJ SOLN
1.0000 [IU] | INTRAMUSCULAR | Status: DC
  Administered 2022-06-05: 1 [IU] via SUBCUTANEOUS

## 2022-06-04 MED ORDER — LACTATED RINGERS IV BOLUS
1000.0000 mL | Freq: Once | INTRAVENOUS | Status: AC
  Administered 2022-06-04: 1000 mL via INTRAVENOUS

## 2022-06-04 MED ORDER — LIDOCAINE HCL (PF) 1 % IJ SOLN: 5.0000 mL | INTRAMUSCULAR | Status: DC | PRN

## 2022-06-04 MED ORDER — DEXTROSE 10 % IV SOLN: INTRAVENOUS | Status: DC

## 2022-06-04 MED ORDER — OSELTAMIVIR PHOSPHATE 30 MG PO CAPS
30.0000 mg | ORAL_CAPSULE | Freq: Every day | ORAL | Status: DC
  Filled 2022-06-04: qty 1

## 2022-06-04 MED ORDER — PENTAFLUOROPROP-TETRAFLUOROETH EX AERO: 1.0000 | INHALATION_SPRAY | CUTANEOUS | Status: DC | PRN

## 2022-06-04 MED ORDER — INSULIN REGULAR(HUMAN) IN NACL 100-0.9 UT/100ML-% IV SOLN
INTRAVENOUS | Status: DC
  Administered 2022-06-04: 4.8 [IU]/h via INTRAVENOUS
  Filled 2022-06-04: qty 100

## 2022-06-04 NOTE — ED Notes (Signed)
Patient at this time not answering any questions and confused. Patient would not lay on her back to get EKG.

## 2022-06-04 NOTE — Consult Note (Addendum)
[facility] Renal Consultation Note    Indication for Consultation:  Management of ESRD/hemodialysis; anemia, hypertension/volume and secondary hyperparathyroidism   HPI: Ann Robertson is a 32 y.o. female with ESRD on HD MWF, T1DM who is admitted with DKA. She was found altered by family at home. On arrival labs notable for Na 133, K 5.1, CO2 15, AG 26. She was also Flu B positive. DKA protocol started in the ED, and she did get bolused quite a bit of IVFs. Insulin drip started. She remains obtunded and minimally responsive in the ED. Plan now for ICU admission.   Dialysis MWF at [facility]. She did miss the scheduled holiday dialysis on Sunday 12/31. Her last dialysis was Wednesday 1/3 -she completed 2:41.  She was seen by a provider and her dry weight was lowered. They noted that she looked awful and was confused at that treatment.   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   Neuropathy    Preeclampsia    STD (sexually transmitted disease)    Past Surgical History:  Procedure Laterality Date   AV FISTULA PLACEMENT Left 06/19/2021   Procedure: INSERTION OF ARTERIOVENOUS (AV) GORE-TEX GRAFT ARM;  Surgeon: Angelia Mould, MD;  Location: [place];  Service: Vascular;  Laterality: Left;   [place] N/A 07/05/2013   Procedure: CESAREAN SECTION;  Surgeon: Emily Filbert, MD;  Location: [facility] [facility];  Service: Obstetrics;  Laterality: N/A;   CESAREAN SECTION N/A 07/25/2014   Procedure: CESAREAN SECTION;  Surgeon: Donnamae Jude, MD;  Location: [facility] [place] [facility];  Service: Obstetrics;  Laterality: N/A;   CESAREAN SECTION N/A 11/21/2015   Procedure: CESAREAN SECTION;  Surgeon: Donnamae Jude, MD;  Location: [place];  Service: Obstetrics;  Laterality: N/A;   IR FLUORO GUIDE CV LINE RIGHT  06/16/2021   IR US GUIDE VASC ACCESS RIGHT  06/16/2021   WISDOM TOOTH EXTRACTION      Family History  Problem Relation Age of Onset   Stroke Mother    Hypertension Mother    Heart disease Mother    Kidney disease Mother    Hyperlipidemia Father    Diabetes Father    Social History:  reports that she has quit smoking. Her smoking use included cigarettes. She has a 2.25 pack-year smoking history. She has never used smokeless tobacco. She reports that she does not currently use alcohol. She reports that she does not use drugs. Allergies  Allergen Reactions   Hydromorphone Hcl Itching and Rash   Prior to Admission medications   Medication Sig Start Date End Date Taking? Authorizing Provider  gabapentin (NEURONTIN) 100 MG capsule Take 100 mg by mouth daily. 02/15/22   [provider]  Glucagon (BAQSIMI ONE PACK) 3 MG/DOSE POWD Place 1 each into the nose as needed. 02/24/22   Rick Duff, MD  Glucose 15 GM/32ML GEL Please use gel for low blood sugars per instructions with packaging 02/24/22   Rick Duff, MD  hydrOXYzine (ATARAX) 25 MG tablet Take 1 tablet (25 mg total) by mouth 2 (two) times daily as needed for itching. 02/24/22   Rick Duff, MD  insulin glargine (LANTUS SOLOSTAR) 100 UNIT/ML Solostar Pen Inject 3 Units into the skin at bedtime. 02/24/22   Rick Duff, MD  insulin lispro (HUMALOG) 100 UNIT/ML KwikPen Inject 1-3 Units into the skin See admin instructions. Inject 1-3 units up  to three times daily with meals (only if eating) per sliding scale based on blood glucose and Carb count. 02/24/22   Rick Duff, MD  labetalol (NORMODYNE) 100 MG tablet Take 100 mg by mouth 2 (two) times daily. 03/26/21   [provider]  lidocaine-prilocaine (EMLA) cream Apply 1 Application topically as needed (For port access). 02/23/22   Rick Duff, MD  loperamide (IMODIUM) 2 MG capsule Please take one (2mg ) tablet up to four times a day for loose stools, until you have 1-2 bowel movements. Please continue this dose. If you stop having  bowel movements, please stop using this medication. 02/24/22   Rick Duff, MD  methocarbamol (ROBAXIN) 750 MG tablet Take 750 mg by mouth 3 (three) times daily. 02/16/22   [provider]  omeprazole (PRILOSEC) 20 MG capsule Take 20 mg by mouth daily as needed (acid reflux/heartburn).    [provider]  ondansetron (ZOFRAN) 4 MG tablet Take 1 tablet (4 mg total) by mouth every 8 (eight) hours as needed for nausea or vomiting. 02/24/22   Rick Duff, MD  oxyCODONE-acetaminophen (PERCOCET) 10-325 MG tablet Take 1 tablet by mouth every 4 (four) hours as needed for pain. 02/13/21   [provider]  phenylephrine-shark liver oil-mineral oil-petrolatum (PREPARATION H) 0.25-14-74.9 % rectal ointment Place 1 Application rectally 2 (two) times daily as needed for hemorrhoids. 02/23/22   Rick Duff, MD   Current Facility-Administered Medications  Medication Dose Route Frequency Provider Last Rate Last Admin   Chlorhexidine Gluconate Cloth 2 % PADS 6 each  6 each Topical Q0600 Lynnda Child, PA-C       dextrose 5 %-0.9 % sodium chloride infusion   Intravenous Continuous Patrecia Pour, MD 50 mL/hr at 06/04/22 1120 New Bag at 06/04/22 1120   dextrose 50 % solution 0-50 mL  0-50 mL Intravenous PRN Mesner, Corene Cornea, MD       heparin injection 1,100 Units  20 Units/kg Dialysis PRN Lynnda Child, PA-C       heparin injection 5,000 Units  5,000 Units Subcutaneous Q8H Clance Boll, MD       insulin regular, human (MYXREDLIN) 100 units/ 100 mL infusion   Intravenous Continuous Mesner, Corene Cornea, MD 1.2 mL/hr at 06/04/22 1120 1.2 Units/hr at 06/04/22 1120   lidocaine (PF) (XYLOCAINE) 1 % injection 5 mL  5 mL Intradermal PRN Lynnda Child, PA-C       lidocaine-prilocaine (EMLA) cream 1 Application  1 Application Topical PRN Lynnda Child, PA-C       oseltamivir (TAMIFLU) capsule 30 mg  30 mg Oral Daily Clance Boll, MD        pentafluoroprop-tetrafluoroeth (GEBAUERS) aerosol 1 Application  1 Application Topical PRN Lynnda Child, PA-C       Current Outpatient Medications  Medication Sig Dispense Refill   gabapentin (NEURONTIN) 100 MG capsule Take 100 mg by mouth daily.     Glucagon (BAQSIMI ONE PACK) 3 MG/DOSE POWD Place 1 each into the nose as needed. 1 each 0   Glucose 15 GM/32ML GEL Please use gel for low blood sugars per instructions with packaging 32 mL 0   hydrOXYzine (ATARAX) 25 MG tablet Take 1 tablet (25 mg total) by mouth 2 (two) times daily as needed for itching. 30 tablet 0   insulin glargine (LANTUS SOLOSTAR) 100 UNIT/ML Solostar Pen Inject 3 Units into the skin at bedtime. 15 mL 11   insulin lispro (HUMALOG) 100 UNIT/ML KwikPen Inject 1-3 Units into the  skin See admin instructions. Inject 1-3 units up to three times daily with meals (only if eating) per sliding scale based on blood glucose and Carb count. 15 mL 11   labetalol (NORMODYNE) 100 MG tablet Take 100 mg by mouth 2 (two) times daily.     lidocaine-prilocaine (EMLA) cream Apply 1 Application topically as needed (For port access). 30 g 0   loperamide (IMODIUM) 2 MG capsule Please take one (2mg ) tablet up to four times a day for loose stools, until you have 1-2 bowel movements. Please continue this dose. If you stop having bowel movements, please stop using this medication. 90 capsule 0   methocarbamol (ROBAXIN) 750 MG tablet Take 750 mg by mouth 3 (three) times daily.     omeprazole (PRILOSEC) 20 MG capsule Take 20 mg by mouth daily as needed (acid reflux/heartburn).     ondansetron (ZOFRAN) 4 MG tablet Take 1 tablet (4 mg total) by mouth every 8 (eight) hours as needed for nausea or vomiting. 30 tablet 0   oxyCODONE-acetaminophen (PERCOCET) 10-325 MG tablet Take 1 tablet by mouth every 4 (four) hours as needed for pain.     phenylephrine-shark liver oil-mineral oil-petrolatum (PREPARATION H) 0.25-14-74.9 % rectal ointment Place 1 Application  rectally 2 (two) times daily as needed for hemorrhoids. 28 g 0     ROS: As per HPI otherwise negative.  Physical Exam: Vitals:   06/04/22 0800 06/04/22 0934 06/04/22 1000 06/04/22 1100  BP: (!) 152/92  (!) 145/80 (!) 152/96  Pulse: 79  83 78  Resp: 13  13 12   Temp:  98.2 F (36.8 C)    TempSrc:  Oral    SpO2: 99%  100% 97%  Weight:      Height:         General: Young woman, chronically ill appearing  Head: NCAT sclera not icteric MMM Neck: Supple. No JVD appreciated  Lungs: Clear bilaterally  Heart: RRR, no murmur, rub, or gallop  Abdomen: soft non-tender, bowel sounds normal, no masses  Lower extremities: pitting edema to LE  Neuro: Obtunded, minimal response to voice  Skin: warm, dry intact  Dialysis Access: L AVG +bruit   Labs: Basic Metabolic Panel: Recent Labs  Lab 06/03/22 2209 06/03/22 2218 06/04/22 0427 06/04/22 0721  NA 133* 130*  130* 132* 134*  K 5.1 5.0  5.0 3.4* 3.3*  CL 92* 96* 96*  --   CO2 15*  --  17*  --   GLUCOSE 347* 334* 286*  --   BUN 43* 41* 41*  --   CREATININE 5.96* 6.40* 5.40*  --   CALCIUM 7.7*  --  7.2*  --    Liver Function Tests: Recent Labs  Lab 06/03/22 2209  AST 177*  ALT 55*  ALKPHOS 387*  BILITOT 1.7*  PROT 5.6*  ALBUMIN 2.3*   No results for input(s): "LIPASE", "AMYLASE" in the last 168 hours. Recent Labs  Lab 06/03/22 2209  AMMONIA 62*   CBC: Recent Labs  Lab 06/03/22 2209 06/03/22 2218 06/04/22 0721  WBC 7.9  --   --   NEUTROABS 6.3  --   --   HGB 12.0 15.0  15.0 13.3  HCT 36.4 44.0  44.0 39.0  MCV 89.9  --   --   PLT 111*  --   --    Cardiac Enzymes: Recent Labs  Lab 06/03/22 2209  CKTOTAL 278*   CBG: Recent Labs  Lab 06/04/22 0525 06/04/22 0629 06/04/22 0749 06/04/22 0919 06/04/22  1111  GLUCAP 231* 251* 189* 182* 147*   Iron Studies: No results for input(s): "IRON", "TIBC", "TRANSFERRIN", "FERRITIN" in the last 72 hours. Studies/Results: CT Head Wo Contrast  Result Date:  06/04/2022 CLINICAL DATA:  Mental status change, unknown cause EXAM: CT HEAD WITHOUT CONTRAST TECHNIQUE: Contiguous axial images were obtained from the base of the skull through the vertex without intravenous contrast. RADIATION DOSE REDUCTION: This exam was performed according to the departmental dose-optimization program which includes automated exposure control, adjustment of the mA and/or kV according to patient size and/or use of iterative reconstruction technique. COMPARISON:  CT head 02/08/2022 FINDINGS: Brain: No evidence of large-territorial acute infarction. No parenchymal hemorrhage. No mass lesion. No extra-axial collection. No mass effect or midline shift. No hydrocephalus. Basilar cisterns are patent. Vascular: No hyperdense vessel. Skull: No acute fracture or focal lesion. Sinuses/Orbits: Paranasal sinuses and mastoid air cells are clear. The orbits are unremarkable. Other: None. IMPRESSION: No acute intracranial abnormality. Electronically Signed   By: Iven Finn M.D.   On: 06/04/2022 01:12   DG Chest Portable 1 View  Result Date: 06/03/2022 CLINICAL DATA:  Altered mental status. EXAM: PORTABLE CHEST 1 VIEW COMPARISON:  02/08/2022. FINDINGS: Heart is enlarged and the mediastinal contour is within normal limits. There is a small right pleural effusion with atelectasis or infiltrate at the right lung base. Visualized portion of the left lung is clear. No pneumothorax is seen. No acute osseous abnormality. IMPRESSION: 1. Small right pleural effusion with atelectasis or infiltrate. 2. Cardiomegaly. Electronically Signed   By: Brett Fairy M.D.   On: 06/03/2022 22:36    Dialysis Orders:  Unit: [facility] MWF  Time: 4:00  EDW: 49 kg  Flows: 400/500 Bath: 2K/2Ca  Access: AVG  Heparin: 2500 bolus  ESA: Mircera 120 q 4 wks (last 12/24) VDRA: Hectorol 1 TIW   Assessment/Plan:  DKA/T1DM - insulin per primary team. Will be admitted to ICU while on insulin drip  ESRD -  HD MWF. HD today -bedside  HD when she gets an ICU bed   Hypertension/volume  - Chronic volume overload. UF as able with HD today   Anemia  - Hb above goal. No ESA needs currently   Metabolic bone disease -  Corr Ca ok. Continue home binders/VDRA   Nutrition - Renal diet when eating   Lynnda Child PA-C [facility] 06/04/2022, 12:07 PM      Seen and examined independently this morning.  Agree with note and exam as documented above by physician extender and as noted here.  Patient with ESRD and DM presents from home after found altered by family.  She has been noncompliant with dialysis treatments - either missing or shortening treatments.    General adult female in stretcher - confused HEENT normocephalic atraumatic extraocular movements intact sclera anicteric Neck supple trachea midline Lungs reduced to auscultation bilaterally normal work of breathing at rest  Heart S1S2 no rubs Abdomen soft nontender nondistended Extremities 2+ edema  Neuro - provides her name - speech difficult to discern; does not answer other orienting questions  Access Left AV graft with bruit   DKA - per primary team. They are continuing the insulin gtt which we are not able to titrate in HD unit.  They do wish for her to be dialyzed today.  Please do not give standard DKA fluids in the setting of ESRD - she was aggressively hydrated overnight. If needed can do D5 or D10 at low rate such as 50 ml/hr  while on insulin gtt  AMS - per primary team. Setting of noncompliance with HD and DKA  ESRD - HD today and per MWF schedule   HTN - UF as able   Influenza B - per primary team   Metabolic bone disease -continue home regimen - on hectorol. Binders when taking PO  Anemia CKD - Hb above goal - defer ESA   Spoke with the primary team and the ICU team at bedside - she is going to the ICU for the insulin gtt with HD.  Hopefully can transfer out soon and hopefully HD will help expedite recovery  Claudia Desanctis,  MD 06/04/2022  6:33 PM

## 2022-06-04 NOTE — ED Notes (Signed)
Gave report to hemodialysis RN

## 2022-06-04 NOTE — ED Notes (Signed)
ICU RN to call this RN back

## 2022-06-04 NOTE — Consult Note (Addendum)
NAME:  Ann Robertson, MRN:  [id number], DOB:  1990-09-30, LOS: 0 ADMISSION DATE:  06/03/2022, CONSULTATION DATE: 06/04/2022 REFERRING MD: [facility], CHIEF COMPLAINT: End-stage renal disease, DKA  History of Present Illness:  32 year old female who is known end-stage renal disease and has had multiple admissions for DKA.  She presents with altered mental status and in DKA and currently is on a insulin drip and needs dialysis.  She cannot go to the dialysis unit on insulin drip.  Therefore she will be admitted to the intensive care unit per the hospitalist service and pulmonary critical care will be available as needed to assist.  When she has hemodialysis interim metabolic disarray is corrected and she should be able to go back to the floor.  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 rate 12, height 5\' 5"  (1.651 m), weight 56.7 kg, SpO2 97 %.        Intake/Output Summary (Last 24 hours) at 06/04/2022 1157 Last data filed at 06/04/2022 1120 Gross per 24 hour  Intake 3269.82 ml  Output --  Net 3269.82 ml   Filed Weights   06/04/22 0015  Weight: 56.7 kg    Examination: General: Frail thin female HENT: Pupils equal reactive to light Lungs: Decreased breath sounds throughout Cardiovascular: Heart sounds are regular Abdomen: Abdomen soft tender positive bowel sounds Extremities: Lower extremities with 2+ pitting edema Neuro: Intermittently follows commands   Resolved Hospital Problem  list     Assessment & Plan:  Altered mental status in the setting of diabetic ketoacidosis. Continue to monitor mental status should improve once you are metabolic disarray is corrected  Diabetic ketoacidosis Insulin drip Electrolyte replacement as needed Fluid resuscitation as needed  End-stage renal disease with volume overload secondary to fluid resuscitation Plan for dialysis She will be admitted to the intensive care per [facility] due to the fact she is on an insulin drip at this time.  Flu a is positive Currently on Tamiflu  Should her hemodynamics worsen pulmonary critical care will be amenable to assume her care.  Best Practice (right click and "Reselect all SmartList Selections" daily)   Diet/type: NPO DVT prophylaxis: not indicated GI prophylaxis: PPI Lines: N/A Foley:  N/A Code Status:  full code Last date of multidisciplinary goals of care discussion [tbd]  Labs   CBC: Recent Labs  Lab 06/03/22 2209 06/03/22 2218 06/04/22 0721  WBC 7.9  --   --   NEUTROABS 6.3  --   --   HGB 12.0 15.0  15.0 13.3  HCT 36.4 44.0  44.0 39.0  MCV 89.9  --   --   PLT 111*  --   --     Basic Metabolic Panel: Recent Labs  Lab 06/03/22 2209 06/03/22 2218 06/04/22 0427 06/04/22 0721  NA 133* 130*  130* 132* 134*  K 5.1 5.0  5.0 3.4* 3.3*  CL 92* 96* 96*  --   CO2 15*  --  17*  --  GLUCOSE 347* 334* 286*  --   BUN 43* 41* 41*  --   CREATININE 5.96* 6.40* 5.40*  --   CALCIUM 7.7*  --  7.2*  --    GFR: Estimated Creatinine Clearance: 13.3 mL/min (A) (by C-G formula based on SCr of 5.4 mg/dL (H)). Recent Labs  Lab 06/03/22 2209  WBC 7.9  LATICACIDVEN 5.7*    Liver Function Tests: Recent Labs  Lab 06/03/22 2209  AST 177*  ALT 55*  ALKPHOS 387*  BILITOT 1.7*  PROT 5.6*  ALBUMIN 2.3*   No results for input(s): "LIPASE", "AMYLASE" in the last 168 hours. Recent Labs  Lab 06/03/22 2209  AMMONIA 62*    ABG    Component Value Date/Time   PHART 7.396  06/04/2022 0721   PCO2ART 34.8 06/04/2022 0721   PO2ART 72 (L) 06/04/2022 0721   HCO3 21.5 06/04/2022 0721   TCO2 23 06/04/2022 0721   ACIDBASEDEF 3.0 (H) 06/04/2022 0721   O2SAT 95 06/04/2022 0721     Coagulation Profile: No results for input(s): "INR", "PROTIME" in the last 168 hours.  Cardiac Enzymes: Recent Labs  Lab 06/03/22 2209  CKTOTAL 278*    HbA1C: HbA1c POC (<> result, manual entry)  Date/Time Value Ref Range Status  09/10/2020 01:35 PM >15 4.0 - 5.6 % Final   Hgb A1c MFr Bld  Date/Time Value Ref Range Status  02/08/2022 09:41 PM 14.3 (H) 4.8 - 5.6 % Final    Comment:    (NOTE) Pre diabetes:          5.7%-6.4%  Diabetes:              >6.4%  Glycemic control for   <7.0% adults with diabetes   06/16/2021 07:00 PM 6.5 (H) 4.8 - 5.6 % Final    Comment:    (NOTE) Pre diabetes:          5.7%-6.4%  Diabetes:              >6.4%  Glycemic control for   <7.0% adults with diabetes     CBG: Recent Labs  Lab 06/04/22 0525 06/04/22 0629 06/04/22 0749 06/04/22 0919 06/04/22 1111  GLUCAP 231* 251* 189* 182* 147*    Review of Systems:   Na   Past Medical History:  She,  has a past medical history of High cholesterol, HSV-2 infection, Juvenile diabetes (05/31/1998), Neuropathy, Preeclampsia, and STD (sexually transmitted disease).   Surgical History:   Past Surgical History:  Procedure Laterality Date   AV FISTULA PLACEMENT Left 06/19/2021   Procedure: INSERTION OF ARTERIOVENOUS (AV) GORE-TEX GRAFT ARM;  Surgeon: Angelia Mould, MD;  Location: [place];  Service: Vascular;  Laterality: Left;   [place] N/A 07/05/2013   Procedure: CESAREAN SECTION;  Surgeon: Emily Filbert, MD;  Location: [facility];  Service: Obstetrics;  Laterality: N/A;   CESAREAN SECTION N/A 07/25/2014   Procedure: CESAREAN SECTION;  Surgeon: Donnamae Jude, MD;  Location: [facility];  Service: Obstetrics;  Laterality: N/A;   CESAREAN SECTION N/A 11/21/2015    Procedure: CESAREAN SECTION;  Surgeon: Donnamae Jude, MD;  Location: [place];  Service: Obstetrics;  Laterality: N/A;   IR FLUORO GUIDE CV LINE RIGHT  06/16/2021   IR US GUIDE VASC ACCESS RIGHT  06/16/2021   WISDOM TOOTH EXTRACTION       Social History:   reports that she has quit smoking. Her smoking use included cigarettes. She has a  2.25 pack-year smoking history. She has never used smokeless tobacco. She reports that she does not currently use alcohol. She reports that she does not use drugs.   Family History:  Her family history includes Diabetes in her father; Heart disease in her mother; Hyperlipidemia in her father; Hypertension in her mother; Kidney disease in her mother; Stroke in her mother.   Allergies Allergies  Allergen Reactions   Hydromorphone Hcl Itching and Rash     Home Medications  Prior to Admission medications   Medication Sig Start Date End Date Taking? Authorizing Provider  gabapentin (NEURONTIN) 100 MG capsule Take 100 mg by mouth daily. 02/15/22   [provider]  Glucagon (BAQSIMI ONE PACK) 3 MG/DOSE POWD Place 1 each into the nose as needed. 02/24/22   Rick Duff, MD  Glucose 15 GM/32ML GEL Please use gel for low blood sugars per instructions with packaging 02/24/22   Rick Duff, MD  hydrOXYzine (ATARAX) 25 MG tablet Take 1 tablet (25 mg total) by mouth 2 (two) times daily as needed for itching. 02/24/22   Rick Duff, MD  insulin glargine (LANTUS SOLOSTAR) 100 UNIT/ML Solostar Pen Inject 3 Units into the skin at bedtime. 02/24/22   Rick Duff, MD  insulin lispro (HUMALOG) 100 UNIT/ML KwikPen Inject 1-3 Units into the skin See admin instructions. Inject 1-3 units up to three times daily with meals (only if eating) per sliding scale based on blood glucose and Carb count. 02/24/22   Rick Duff, MD  labetalol (NORMODYNE) 100 MG tablet Take 100 mg by mouth 2 (two) times daily. 03/26/21   [provider]   lidocaine-prilocaine (EMLA) cream Apply 1 Application topically as needed (For port access). 02/23/22   Rick Duff, MD  loperamide (IMODIUM) 2 MG capsule Please take one (2mg ) tablet up to four times a day for loose stools, until you have 1-2 bowel movements. Please continue this dose. If you stop having bowel movements, please stop using this medication. 02/24/22   Rick Duff, MD  methocarbamol (ROBAXIN) 750 MG tablet Take 750 mg by mouth 3 (three) times daily. 02/16/22   [provider]  omeprazole (PRILOSEC) 20 MG capsule Take 20 mg by mouth daily as needed (acid reflux/heartburn).    [provider]  ondansetron (ZOFRAN) 4 MG tablet Take 1 tablet (4 mg total) by mouth every 8 (eight) hours as needed for nausea or vomiting. 02/24/22   Rick Duff, MD  oxyCODONE-acetaminophen (PERCOCET) 10-325 MG tablet Take 1 tablet by mouth every 4 (four) hours as needed for pain. 02/13/21   [provider]  phenylephrine-shark liver oil-mineral oil-petrolatum (PREPARATION H) 0.25-14-74.9 % rectal ointment Place 1 Application rectally 2 (two) times daily as needed for hemorrhoids. 02/23/22   Rick Duff, MD     Critical care time:     Gaylyn Lambert ACNP Acute Care Nurse Practitioner [place] Please consult Amion 06/04/2022, 11:57 AM

## 2022-06-04 NOTE — ED Notes (Signed)
Delay in transport d/t high acuity assignment

## 2022-06-04 NOTE — Progress Notes (Signed)
eLink Physician-Brief Progress Note Patient Name: Ann Robertson DOB: 1990-10-16 MRN: [id number]   Date of Service  06/04/2022  HPI/Events of Note  Hyperglycemia - EndoTool prompting to transition off IV insulin. Beta-Hydroxybutyric Acid = 0.09.  eICU Interventions  Plan: Will transition to Levemir 5 units [employer] Q 12 hours + Q 4 hour sensitive Novolog SSI.      Intervention Category Major Interventions: Hyperglycemia - active titration of insulin therapy  Lysle Dingwall 06/04/2022, 10:55 PM

## 2022-06-04 NOTE — ED Notes (Signed)
Pt drowsy and will open eyes to voice. Pt oriented x 1 (self) at this time, pt will not answer any questions.

## 2022-06-04 NOTE — Progress Notes (Signed)
   06/04/22 1918  Vitals  Temp (!) 97.4 F (36.3 C)  Temp Source Axillary  BP (!) 154/85  MAP (mmHg) 101  BP Location Right Arm  BP Method Automatic  Patient Position (if appropriate) Lying  Pulse Rate 93  Pulse Rate Source Monitor  ECG Heart Rate 92  Resp (!) 28  Oxygen Therapy  SpO2 94 %  O2 Device Room Air  Pulse Oximetry Type Continuous   Received patient in bed to unit.  Alert and oriented.  Informed consent signed and in chart.   Treatment initiated: 1503  Treatment completed: 1848   Patient tolerated well.  Transported back to the room  Alert, without acute distress.  Hand-off given to patient's nurse.   Access used: AVF Access issues: nothing w/ the actual access, pt pt was non compliant throughout tx w/ keeping access arm still and straight which was risking infiltration or needle dislodgement  Total UF removed: 3060ml Medication(s) given: Heparin 1100 units bolus, Hectorol 34mcg IVP Post HD VS: see above Post HD weight: 51.9kg   [facility]

## 2022-06-04 NOTE — Inpatient Diabetes Management (Signed)
Inpatient Diabetes Program Recommendations  AACE/ADA: New Consensus Statement on Inpatient Glycemic Control (2015)  Target Ranges:  Prepandial:   less than 140 mg/dL      Peak postprandial:   less than 180 mg/dL (1-2 hours)      Critically ill patients:  140 - 180 mg/dL    Latest Reference Range & Units 06/16/21 19:00 02/08/22 21:41  Hemoglobin A1C 4.8 - 5.6 % 6.5 (H) 14.3 (H)  363 mg/dl  (H): Data is abnormally high  Latest Reference Range & Units 06/03/22 22:09  Sodium 135 - 145 mmol/L 133 (L)  Potassium 3.5 - 5.1 mmol/L 5.1  Chloride 98 - 111 mmol/L 92 (L)  CO2 22 - 32 mmol/L 15 (L)  Glucose 70 - 99 mg/dL 347 (H)  BUN 6 - 20 mg/dL 43 (H)  Creatinine 0.44 - 1.00 mg/dL 5.96 (H)  Calcium 8.9 - 10.3 mg/dL 7.7 (L)  Anion gap 5 - 15  26 (H)  (L): Data is abnormally low (H): Data is abnormally high  Latest Reference Range & Units 06/04/22 01:16 06/04/22 02:30 06/04/22 03:29 06/04/22 04:18 06/04/22 05:25 06/04/22 06:29 06/04/22 07:49 06/04/22 09:19  Glucose-Capillary 70 - 99 mg/dL 290 (H)  IV Insulin Drip Started 253 (H) 299 (H) 252 (H) 231 (H) 251 (H) 189 (H) 182 (H)  IV Insulin Drip Infusing  (H): Data is abnormally high   Admit with: DKA/ Missed Several HD Sessions/ Influenza+  History: Type 1 Diabetes (requires basal bolus insulin for correction and meal coverage), ESRD  Home DM Meds: Lantus 3 units QHS        Humalog 1-3 units TID based on CBG and Carb Count  Current Orders: IV Insulin Drip    Pt was counseled by the Diabetes RN back in Sept when her A1c was 14.3%  Needs follow up with Endocrinology    MD- Please leave pt on the IV Insulin Drip until Glucose Stable, CO2 at least 20 or higher, and Anion Gap closer to 12 or less  When she is stable for transition to SQ Insulin, please make sure pt receives home dose Basal insulin at least 2 hours prior to d/c of the IV Insulin Drip     --Will follow patient during hospitalization--  Wyn Quaker  RN, MSN, [place] Diabetes Coordinator Inpatient Glycemic Control Team Team Pager: [phone] (8a-5p)

## 2022-06-04 NOTE — ED Notes (Signed)
Phlebotomy and EDT tried a total of 2 times each to obtain blood.

## 2022-06-04 NOTE — ED Provider Notes (Signed)
12:24 AM Assumed care from Dr. Gilford Raid, please see their note for full history, physical and decision making until this point. In brief this is a 32 y.o. year old female who presented to the ED tonight with Altered Mental Status     Altered Mental Status. Unclear etiology. Possibly DKA, possibly infectious. Pending workup and reevaluation for likely admission.   Acidotic on labs. Bhb up. LA up. Suspect DKA, will initiate treatment for same.   Flu test positive, likely precipitating factor. On my evaluation patient is awake, alert and answers questions. Per nursing and report from previous physician this sounds like a marked improvement. Updated father. Hold on tamiflu with ESRD. Normally a M/W/F dialysis patient. Will d/w medicine for admission.   CRITICAL CARE Performed by: Merrily Pew Total critical care time: 35 minutes Critical care time was exclusive of separately billable procedures and treating other patients. Critical care was necessary to treat or prevent imminent or life-threatening deterioration. Critical care was time spent personally by me on the following activities: development of treatment plan with patient and/or surrogate as well as nursing, discussions with consultants, evaluation of patient's response to treatment, examination of patient, obtaining history from patient or surrogate, ordering and performing treatments and interventions, ordering and review of laboratory studies, ordering and review of radiographic studies, pulse oximetry and re-evaluation of patient's condition.  Labs, studies and imaging reviewed by myself and considered in medical decision making if ordered. Imaging interpreted by radiology.  Labs Reviewed  CBC WITH DIFFERENTIAL/PLATELET - Abnormal; Notable for the following components:      Result Value   RDW 15.6 (*)    Platelets 111 (*)    nRBC 1.1 (*)    Abs Immature Granulocytes 0.08 (*)    All other components within normal limits  COMPREHENSIVE  METABOLIC PANEL - Abnormal; Notable for the following components:   Sodium 133 (*)    Chloride 92 (*)    CO2 15 (*)    Glucose, Bld 347 (*)    BUN 43 (*)    Creatinine, Ser 5.96 (*)    Calcium 7.7 (*)    Total Protein 5.6 (*)    Albumin 2.3 (*)    AST 177 (*)    ALT 55 (*)    Alkaline Phosphatase 387 (*)    Total Bilirubin 1.7 (*)    GFR, Estimated 9 (*)    Anion gap 26 (*)    All other components within normal limits  AMMONIA - Abnormal; Notable for the following components:   Ammonia 62 (*)    All other components within normal limits  LACTIC ACID, PLASMA - Abnormal; Notable for the following components:   Lactic Acid, Venous 5.7 (*)    All other components within normal limits  CK - Abnormal; Notable for the following components:   Total CK 278 (*)    All other components within normal limits  I-STAT CHEM 8, ED - Abnormal; Notable for the following components:   Sodium 130 (*)    Chloride 96 (*)    BUN 41 (*)    Creatinine, Ser 6.40 (*)    Glucose, Bld 334 (*)    Calcium, Ion 0.90 (*)    TCO2 19 (*)    All other components within normal limits  I-STAT VENOUS BLOOD GAS, ED - Abnormal; Notable for the following components:   pCO2, Ven 33.0 (*)    Bicarbonate 17.9 (*)    TCO2 19 (*)    Acid-base deficit 7.0 (*)  Sodium 130 (*)    Calcium, Ion 0.90 (*)    All other components within normal limits  RESP PANEL BY RT-PCR (RSV, FLU A&B, COVID)  RVPGX2  TSH  URINALYSIS, ROUTINE W REFLEX MICROSCOPIC  URINALYSIS, ROUTINE W REFLEX MICROSCOPIC  BETA-HYDROXYBUTYRIC ACID  CBG MONITORING, ED  I-STAT BETA HCG BLOOD, ED ([facility], [facility], [facility] ONLY)    DG Chest Portable 1 View  Final Result    CT Head Wo Contrast    (Results Pending)    No follow-ups on file.    Merrily Pew, MD 06/04/22 [phone]

## 2022-06-04 NOTE — H&P (Addendum)
History and Physical    Coley Littles SAY:[id number] DOB: 1990/07/26 DOA: 06/03/2022  [facility]P: [facility] [place]  Patient coming from: home  I have personally briefly reviewed patient's old medical records in [place]  Chief Complaint: Change in ms   HPI: Ann Robertson is a 32 y.o. female with medical history significant of  HLD, DM type1, ESRD MWF, who presents to ED after she was found to be obtunded by family.  Per family she has missed several HD sessions . Patient currently will awake but somnolent, note able to give detailed history at this time.    ED Course:  Afeb, bp 146/87, HR 88, rr 15 sat 99%  Wbc 7.9, hgb 12, plt 111,  Ammonia 62  N a 133, K 5.1, bicarb 15, glu 347,  AG 26  cr6.4 CK 278  Lactic 5.7  TSH 2.577 Betahydroxy 7.37 Ph 7.34/pco2 33 Cxr:1. Small right pleural effusion with atelectasis or infiltrate. 2. Cardiomegaly.  +influenza CTH: NAD  Tx place on endo tool   Review of Systems: As per HPI otherwise 10 point review of systems negative.   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   Neuropathy    Preeclampsia    STD (sexually transmitted disease)     Past Surgical History:  Procedure Laterality Date   AV FISTULA PLACEMENT Left 06/19/2021   Procedure: INSERTION OF ARTERIOVENOUS (AV) GORE-TEX GRAFT ARM;  Surgeon: Angelia Mould, MD;  Location: [facility];  Service: Vascular;  Laterality: Left;   [place] N/A 07/05/2013   Procedure: CESAREAN SECTION;  Surgeon: Emily Filbert, MD;  Location: [facility];  Service: Obstetrics;  Laterality: N/A;   CESAREAN SECTION N/A 07/25/2014   Procedure: CESAREAN SECTION;  Surgeon: Donnamae Jude, MD;  Location: [facility];  Service: Obstetrics;  Laterality: N/A;   CESAREAN SECTION N/A 11/21/2015   Procedure: CESAREAN SECTION;  Surgeon: Donnamae Jude, MD;  Location: [facility];  Service: Obstetrics;  Laterality:  N/A;   IR FLUORO GUIDE CV LINE RIGHT  06/16/2021   IR US GUIDE VASC ACCESS RIGHT  06/16/2021   WISDOM TOOTH EXTRACTION       reports that she has quit smoking. Her smoking use included cigarettes. She has a 2.25 pack-year smoking history. She has never used smokeless tobacco. She reports that she does not currently use alcohol. She reports that she does not use drugs.  Allergies  Allergen Reactions   Hydromorphone Hcl Itching and Rash    Family History  Problem Relation Age of Onset   Stroke Mother    Hypertension Mother    Heart disease Mother    Kidney disease Mother    Hyperlipidemia Father    Diabetes Father     Prior to Admission medications   Medication Sig Start Date End Date Taking? Authorizing Provider  gabapentin (NEURONTIN) 100 MG capsule Take 100 mg by mouth daily. 02/15/22   [provider]  Glucagon (BAQSIMI ONE PACK) 3 MG/DOSE POWD Place 1 each into the nose as needed. 02/24/22   Rick Duff, MD  Glucose 15 GM/32ML GEL Please use gel for low blood sugars per instructions with packaging 02/24/22   Rick Duff, MD  hydrOXYzine (ATARAX) 25 MG tablet Take 1 tablet (25 mg total) by mouth 2 (two) times daily as needed for itching. 02/24/22   Rick Duff, MD  insulin glargine (LANTUS  SOLOSTAR) 100 UNIT/ML Solostar Pen Inject 3 Units into the skin at bedtime. 02/24/22   Rick Duff, MD  insulin lispro (HUMALOG) 100 UNIT/ML KwikPen Inject 1-3 Units into the skin See admin instructions. Inject 1-3 units up to three times daily with meals (only if eating) per sliding scale based on blood glucose and Carb count. 02/24/22   Rick Duff, MD  labetalol (NORMODYNE) 100 MG tablet Take 100 mg by mouth 2 (two) times daily. 03/26/21   [provider]  lidocaine-prilocaine (EMLA) cream Apply 1 Application topically as needed (For port access). 02/23/22   Rick Duff, MD  loperamide (IMODIUM) 2 MG capsule Please take one (2mg ) tablet up to four  times a day for loose stools, until you have 1-2 bowel movements. Please continue this dose. If you stop having bowel movements, please stop using this medication. 02/24/22   Rick Duff, MD  methocarbamol (ROBAXIN) 750 MG tablet Take 750 mg by mouth 3 (three) times daily. 02/16/22   [provider]  omeprazole (PRILOSEC) 20 MG capsule Take 20 mg by mouth daily as needed (acid reflux/heartburn).    [provider]  ondansetron (ZOFRAN) 4 MG tablet Take 1 tablet (4 mg total) by mouth every 8 (eight) hours as needed for nausea or vomiting. 02/24/22   Rick Duff, MD  oxyCODONE-acetaminophen (PERCOCET) 10-325 MG tablet Take 1 tablet by mouth every 4 (four) hours as needed for pain. 02/13/21   [provider]  phenylephrine-shark liver oil-mineral oil-petrolatum (PREPARATION H) 0.25-14-74.9 % rectal ointment Place 1 Application rectally 2 (two) times daily as needed for hemorrhoids. 02/23/22   Rick Duff, MD    Physical Exam: Vitals:   06/04/22 0015 06/04/22 0030 06/04/22 0045 06/04/22 0526  BP: 103/63 126/75 105/74 (!) 148/81  Pulse: 91 92 87 87  Resp: 14 13 14 17   Temp:      TempSrc:      SpO2: 92% 96% 95% 95%  Weight: 56.7 kg     Height: 5\' 5"  (1.651 m)       Constitutional: NAD, calm, somnolent Vitals:   06/04/22 0015 06/04/22 0030 06/04/22 0045 06/04/22 0526  BP: 103/63 126/75 105/74 (!) 148/81  Pulse: 91 92 87 87  Resp: 14 13 14 17   Temp:      TempSrc:      SpO2: 92% 96% 95% 95%  Weight: 56.7 kg     Height: 5\' 5"  (1.651 m)      Eyes: PERRL, lids and conjunctivae normal ENMT: Mucous membranes are dry. Posterior pharynx clear of any exudate or lesions.Normal dentition.  Neck: normal, supple, no masses, no thyromegaly Respiratory: clear to auscultation bilaterally, no wheezing, no crackles. Normal respiratory effort. No accessory muscle use.  Cardiovascular: Regular rate and rhythm, no murmurs / rubs / gallops. +extremity edema. 2+ pedal  pulses. No carotid bruits.  Abdomen: no tenderness, no masses palpated. No hepatosplenomegaly. Bowel sounds positive.  Musculoskeletal: no clubbing / cyanosis. No joint deformity upper and lower extremities. Good ROM, no contractures. Normal muscle tone.  Skin: no rashes, lesions, ulcers. No induration Neurologic: CN 2-12 grossly intact. Sensation intact, MAEx4 Psychiatric: somnolent unable to assess    Labs on Admission: I have personally reviewed following labs and imaging studies  CBC: Recent Labs  Lab 06/03/22 2209 06/03/22 2218  WBC 7.9  --   NEUTROABS 6.3  --   HGB 12.0 15.0  15.0  HCT 36.4 44.0  44.0  MCV 89.9  --   PLT 111*  --  Basic Metabolic Panel: Recent Labs  Lab 06/03/22 2209 06/03/22 2218 06/04/22 0427  NA 133* 130*  130* 132*  K 5.1 5.0  5.0 3.4*  CL 92* 96* 96*  CO2 15*  --  17*  GLUCOSE 347* 334* 286*  BUN 43* 41* 41*  CREATININE 5.96* 6.40* 5.40*  CALCIUM 7.7*  --  7.2*   GFR: Estimated Creatinine Clearance: 13.3 mL/min (A) (by C-G formula based on SCr of 5.4 mg/dL (H)). Liver Function Tests: Recent Labs  Lab 06/03/22 2209  AST 177*  ALT 55*  ALKPHOS 387*  BILITOT 1.7*  PROT 5.6*  ALBUMIN 2.3*   No results for input(s): "LIPASE", "AMYLASE" in the last 168 hours. Recent Labs  Lab 06/03/22 2209  AMMONIA 62*   Coagulation Profile: No results for input(s): "INR", "PROTIME" in the last 168 hours. Cardiac Enzymes: Recent Labs  Lab 06/03/22 2209  CKTOTAL 278*   BNP (last 3 results) No results for input(s): "PROBNP" in the last 8760 hours. HbA1C: No results for input(s): "HGBA1C" in the last 72 hours. CBG: Recent Labs  Lab 06/04/22 0116 06/04/22 0230 06/04/22 0329 06/04/22 0418 06/04/22 0525  GLUCAP 290* 253* 299* 252* 231*   Lipid Profile: No results for input(s): "CHOL", "HDL", "LDLCALC", "TRIG", "CHOLHDL", "LDLDIRECT" in the last 72 hours. Thyroid Function Tests: Recent Labs    06/03/22 2209  TSH 2.577   Anemia  Panel: No results for input(s): "VITAMINB12", "FOLATE", "FERRITIN", "TIBC", "IRON", "RETICCTPCT" in the last 72 hours. Urine analysis:    Component Value Date/Time   COLORURINE YELLOW 02/21/2022 1724   APPEARANCEUR CLEAR 02/21/2022 1724   LABSPEC 1.020 02/21/2022 1724   PHURINE 5.0 02/21/2022 1724   GLUCOSEU >=500 (A) 02/21/2022 1724   HGBUR SMALL (A) 02/21/2022 1724   BILIRUBINUR NEGATIVE 02/21/2022 1724   KETONESUR 5 (A) 02/21/2022 1724   PROTEINUR >=300 (A) 02/21/2022 1724   UROBILINOGEN 0.2 11/06/2015 1011   NITRITE NEGATIVE 02/21/2022 1724   LEUKOCYTESUR NEGATIVE 02/21/2022 1724    Radiological Exams on Admission: CT Head Wo Contrast  Result Date: 06/04/2022 CLINICAL DATA:  Mental status change, unknown cause EXAM: CT HEAD WITHOUT CONTRAST TECHNIQUE: Contiguous axial images were obtained from the base of the skull through the vertex without intravenous contrast. RADIATION DOSE REDUCTION: This exam was performed according to the departmental dose-optimization program which includes automated exposure control, adjustment of the mA and/or kV according to patient size and/or use of iterative reconstruction technique. COMPARISON:  CT head 02/08/2022 FINDINGS: Brain: No evidence of large-territorial acute infarction. No parenchymal hemorrhage. No mass lesion. No extra-axial collection. No mass effect or midline shift. No hydrocephalus. Basilar cisterns are patent. Vascular: No hyperdense vessel. Skull: No acute fracture or focal lesion. Sinuses/Orbits: Paranasal sinuses and mastoid air cells are clear. The orbits are unremarkable. Other: None. IMPRESSION: No acute intracranial abnormality. Electronically Signed   By: Iven Finn M.D.   On: 06/04/2022 01:12   DG Chest Portable 1 View  Result Date: 06/03/2022 CLINICAL DATA:  Altered mental status. EXAM: PORTABLE CHEST 1 VIEW COMPARISON:  02/08/2022. FINDINGS: Heart is enlarged and the mediastinal contour is within normal limits. There is a  small right pleural effusion with atelectasis or infiltrate at the right lung base. Visualized portion of the left lung is clear. No pneumothorax is seen. No acute osseous abnormality. IMPRESSION: 1. Small right pleural effusion with atelectasis or infiltrate. 2. Cardiomegaly. Electronically Signed   By: Brett Fairy M.D.   On: 06/03/2022 22:36    EKG: Independently  reviewed.   Assessment/Plan  Type1 DM with DKA -DKA in setting of influenza viral infection  -place on insulin drip per  dka protocol  - bmp q4-6h  -repeat abg now  -ivfs per protocol  -strict I/o    Influenza with acute hypoxic respiratory failure  -? Infiltrate on cxr  - start on tamiflu  -monitor sat's , current sat 92  -wean O2  as able   ESRD MWD -missed HD sessions -renal consult for HD   Elevated Ammonia  -f/u s/p HD  DVT prophylaxis:  heparin Code Status: full Family Communication: none at bedside Disposition Plan: progressive care  Consults called: Renal (Dr Royce Macadamia) Admission status: progressive care   Clance Boll MD [facility]  If 7PM-7AM, please contact night-coverage [email or web address] Password [email or web address]  06/04/2022, 6:01 AM

## 2022-06-04 NOTE — ED Notes (Signed)
Took over pt care at this time 

## 2022-06-04 NOTE — Progress Notes (Addendum)
PROGRESS NOTE  Brief Narrative: Ann Robertson is a 32 y.o. female with a history of T1DM, ESRD (HD MWF), and HTN who was brought in by EMS after being found obtunded by family. She has remained somnolent, unable to give history, though she is known to have missed HD sessions. Labs demonstrated DKA with lactic acidosis and +influenza B PCR. CXR shows cardiomegaly with right pleural effusion/opacity. CT head nonacute. She was admitted this morning with DKA on insulin infusion with nephrology consulted to initiate HD today. Lactic acid 5.7, no repeat. BHB downtrending. After IVF, she appears volume overloaded, though remains without hypoxemia and pH on ABG is 7.396.   Subjective: Awakens momentarily, then closes eyes quickly and doesn't verbalize responses to questions.   Objective: BP (!) 152/92   Pulse 79   Temp 98.2 F (36.8 C) (Oral)   Resp 13   Ht 5\' 5"  (1.651 m)   Wt 56.7 kg Comment: from Sept 2023 records  SpO2 99%   BMI 20.80 kg/m   Gen: Lethargic female sleeping soundly. Pulm: Diminished at bases without crackles or wheezes.  CV: RRR, no murmur. +JVD, significant diffuse dependent (L > R due to position) pitting edema.  GI: Soft, not distended, no grimace to palpation.  Neuro: UTD   Assessment & Plan: Remains quite encephalopathic, mixed etiology, though DKA and uremia certainly contributing. She awakens momentarily but doesn't really answer questions. Has a gag reflex and no upper respiratory sounds. She is overloaded, but laying completely flat on her left side without respiratory distress and reassuring ABG. Remains in DKA, though having difficulty drawing labs.   Case discussed with Drs. Carlis Abbott and Royce Macadamia, appreciate their assistance coordinating and providing care.   We've consulted CCM and will admit to ICU so that she can continue insulin infusion and receive HD. To provide dextrose, will continue low rate infusion. Started on tamiflu.   Patrecia Pour, MD Pager on  amion 06/04/2022, 11:14 AM

## 2022-06-05 ENCOUNTER — Other Ambulatory Visit: Payer: Self-pay

## 2022-06-05 DIAGNOSIS — G9341 Metabolic encephalopathy: Secondary | ICD-10-CM | POA: Diagnosis not present

## 2022-06-05 LAB — HEPATITIS B SURFACE ANTIGEN: Hepatitis B Surface Ag: NONREACTIVE

## 2022-06-05 LAB — GLUCOSE, CAPILLARY
Glucose-Capillary: 106 mg/dL — ABNORMAL HIGH (ref 70–99)
Glucose-Capillary: 114 mg/dL — ABNORMAL HIGH (ref 70–99)
Glucose-Capillary: 115 mg/dL — ABNORMAL HIGH (ref 70–99)
Glucose-Capillary: 121 mg/dL — ABNORMAL HIGH (ref 70–99)
Glucose-Capillary: 157 mg/dL — ABNORMAL HIGH (ref 70–99)
Glucose-Capillary: 26 mg/dL — CL (ref 70–99)
Glucose-Capillary: 45 mg/dL — ABNORMAL LOW (ref 70–99)
Glucose-Capillary: 67 mg/dL — ABNORMAL LOW (ref 70–99)
Glucose-Capillary: 76 mg/dL (ref 70–99)
Glucose-Capillary: 81 mg/dL (ref 70–99)
Glucose-Capillary: 81 mg/dL (ref 70–99)

## 2022-06-05 LAB — CBC
HCT: 32.6 % — ABNORMAL LOW (ref 36.0–46.0)
Hemoglobin: 10.9 g/dL — ABNORMAL LOW (ref 12.0–15.0)
MCH: 28.9 pg (ref 26.0–34.0)
MCHC: 33.4 g/dL (ref 30.0–36.0)
MCV: 86.5 fL (ref 80.0–100.0)
Platelets: 88 10*3/uL — ABNORMAL LOW (ref 150–400)
RBC: 3.77 MIL/uL — ABNORMAL LOW (ref 3.87–5.11)
RDW: 15.5 % (ref 11.5–15.5)
WBC: 5.2 10*3/uL (ref 4.0–10.5)
nRBC: 1.3 % — ABNORMAL HIGH (ref 0.0–0.2)

## 2022-06-05 LAB — COMPREHENSIVE METABOLIC PANEL
ALT: 38 U/L (ref 0–44)
AST: 91 U/L — ABNORMAL HIGH (ref 15–41)
Albumin: 2 g/dL — ABNORMAL LOW (ref 3.5–5.0)
Alkaline Phosphatase: 358 U/L — ABNORMAL HIGH (ref 38–126)
Anion gap: 14 (ref 5–15)
BUN: 21 mg/dL — ABNORMAL HIGH (ref 6–20)
CO2: 25 mmol/L (ref 22–32)
Calcium: 7.4 mg/dL — ABNORMAL LOW (ref 8.9–10.3)
Chloride: 95 mmol/L — ABNORMAL LOW (ref 98–111)
Creatinine, Ser: 4.05 mg/dL — ABNORMAL HIGH (ref 0.44–1.00)
GFR, Estimated: 14 mL/min — ABNORMAL LOW (ref >60)
Glucose, Bld: 101 mg/dL — ABNORMAL HIGH (ref 70–99)
Potassium: 3.6 mmol/L (ref 3.5–5.1)
Sodium: 134 mmol/L — ABNORMAL LOW (ref 135–145)
Total Bilirubin: 1.1 mg/dL (ref 0.3–1.2)
Total Protein: 5.2 g/dL — ABNORMAL LOW (ref 6.5–8.1)

## 2022-06-05 LAB — HEPATITIS B SURFACE ANTIBODY, QUANTITATIVE: Hep B S AB Quant (Post): 5.9 m[IU]/mL — ABNORMAL LOW (ref >9.9)

## 2022-06-05 MED ORDER — DEXTROSE 10 % IV SOLN: INTRAVENOUS | Status: DC

## 2022-06-05 MED ORDER — INSULIN DETEMIR 100 UNIT/ML ~~LOC~~ SOLN
3.0000 [IU] | Freq: Every day | SUBCUTANEOUS | Status: DC
  Administered 2022-06-05: 3 [IU] via SUBCUTANEOUS
  Filled 2022-06-05 (×2): qty 0.03

## 2022-06-05 MED ORDER — INSULIN DETEMIR 100 UNIT/ML ~~LOC~~ SOLN: 3.0000 [IU] | Freq: Every day | SUBCUTANEOUS | Status: DC

## 2022-06-05 MED ORDER — GUAIFENESIN 100 MG/5ML PO LIQD
5.0000 mL | ORAL | Status: DC | PRN
  Administered 2022-06-05 – 2022-06-07 (×3): 5 mL via ORAL
  Filled 2022-06-05 (×3): qty 5

## 2022-06-05 MED ORDER — OSELTAMIVIR PHOSPHATE 30 MG PO CAPS
30.0000 mg | ORAL_CAPSULE | Freq: Every day | ORAL | Status: DC
  Administered 2022-06-05: 30 mg via ORAL
  Filled 2022-06-05: qty 1

## 2022-06-05 MED ORDER — OXYCODONE HCL 5 MG PO TABS
5.0000 mg | ORAL_TABLET | Freq: Once | ORAL | Status: AC | PRN
  Administered 2022-06-05: 5 mg via ORAL
  Filled 2022-06-05: qty 1

## 2022-06-05 MED ORDER — OSELTAMIVIR PHOSPHATE 30 MG PO CAPS
30.0000 mg | ORAL_CAPSULE | ORAL | Status: DC
  Administered 2022-06-07: 30 mg via ORAL
  Filled 2022-06-05: qty 1

## 2022-06-05 NOTE — Plan of Care (Signed)

## 2022-06-05 NOTE — Progress Notes (Addendum)
PROGRESS NOTE  Brief Narrative: Ann Robertson is a 32 y.o. female with a history of T1DM, ESRD (HD MWF), and HTN who was brought in by EMS after being found obtunded by family. She has remained somnolent, unable to give history, though she is known to have missed HD sessions. Labs demonstrated DKA with lactic acidosis and +influenza B PCR. CXR shows cardiomegaly with right pleural effusion/opacity. CT head nonacute. She was admitted with DKA on insulin infusion with nephrology consulted to initiate HD today. Lactic acid 5.7. After IVF, she appeared volume overloaded, so was admitted for urgent HD in ICU with ongoing insulin infusion. Acidosis and ketones cleared and transitioned to basal-bolus insulin with hypoglycemia. Remains encephalopathic but improved.   Subjective: Will follow most commands. Does state she is hungry when asked, but is without complaints otherwise. Denies trouble breathing or numbness or weakness anywhere.   Objective: BP 95/71   Pulse 78   Temp (!) 97.5 F (36.4 C) (Axillary)   Resp 12   Ht 5\' 5"  (1.651 m)   Wt 52.4 kg   SpO2 92%   BMI 19.22 kg/m   Gen: Lethargic female sleeping soundly. Pulm: Diminished at bases without crackles or wheezes.  CV: RRR, no murmur. +JVD, significant diffuse dependent (L > R due to position) pitting edema.  GI: Soft, not distended, no grimace to palpation.  Neuro: UTD   Assessment & Plan: Acute metabolic encephalopathy: Due to uremia and DKA, has improved. Now suspect there is behavioral component predominantly.  - Continue monitoring.  - Stable to transfer to the floor.  - Hold sedating home medications - PT/OT consulted as pt was found down and appears diffusely weak.  T1DM, DKA: DKA resolved, transitioned to levemir + SSI overnight, having hypoglycemia.  - Start diet, hold further basal insulin for now.  - We're having to give dextrose infusion, will monitor volume status closely.   ESRD: MWF schedule thru LUE AVG. - s/p  HD for volume overload 1/5, next currently planned for 1/8.   Influenza infection:  - Continue tamiflu dosed for ESRD.  Patrecia Pour, MD Pager on amion 06/05/2022, 2:12 PM

## 2022-06-05 NOTE — Progress Notes (Signed)
eLink Physician-Brief Progress Note Patient Name: Ann Robertson DOB: [birth year] MRN: [id number]   Date of Service  06/05/2022  HPI/Events of Note  Hypoglycemia - Blood glucose = 45. Transitioned to Levemir + Q 4 hour sensitive Novolog SSI last night.  eICU Interventions  Plan: Increase D10W IV infusion to 75 mL/hour.      Intervention Category Major Interventions: Other:  [name] 06/05/2022, 6:20 AM

## 2022-06-05 NOTE — Plan of Care (Signed)

## 2022-06-05 NOTE — Progress Notes (Signed)
Ann Robertson Progress Note   32 y.o. female with ESRD on HD MWF, T1DM who is admitted with DKA found altered. On arrival labs notable for Na 133, K 5.1, CO2 15, AG 26. She was also Flu B positive. DKA protocol started in the ED, and she did get bolused quite a bit of IVFs. Insulin drip started.    Dialysis MWF at [facility]. She did miss the scheduled holiday dialysis on Sunday 12/31. Her last dialysis was Wednesday 1/3 -she completed 2:41.  She was seen by a provider and her dry weight was lowered. They noted that she looked awful and was confused at that treatment.   Dialysis Orders:  Unit: [place] MWF  Time: 4:00  EDW: 49 kg  Flows: 400/500 Bath: 2K/2Ca  Access: AVG  Heparin: 2500 bolus  ESA: Mircera 120 q 4 wks (last 12/24) VDRA: Hectorol 1 TIW   Assessment/ Plan:    DKA/T1DM - insulin per primary team. Admitted to ICU while on insulin drip; off now.  ESRD -  HD MWF. Tolerated HD 1/5 with 3L net UF. Next HD on Monday  Hypertension/volume  - Chronic volume overload.  Anemia  - Hb above goal. No ESA needs currently   Metabolic bone disease -  Corr Ca ok. Continue home binders/VDRA   Nutrition - Renal diet when eating   Subjective:   Moaning, but denies f/c/n/v. Off insulin gtt.   Objective:   BP 131/85   Pulse 88   Temp 98.8 F (37.1 C) (Oral)   Resp 14   Ht 5\' 5"  (1.651 m)   Wt 52.4 kg   SpO2 95%   BMI 19.22 kg/m   Intake/Output Summary (Last 24 hours) at 06/05/2022 4315 Last data filed at 06/05/2022 0600 Gross per 24 hour  Intake 1547.75 ml  Output 3000 ml  Net -1452.25 ml   Weight change: -1.8 kg  Physical Exam: General: Young woman, chronically ill appearing  Head: NCAT sclera not icteric MMM Neck: Supple. No JVD appreciated  Lungs: Clear bilaterally  Heart: RRR, no murmur, rub, or gallop  Abdomen: soft non-tender, bowel sounds normal, no masses  Lower extremities: pitting edema to LE  Neuro: Obtunded, minimal response to  voice  Skin: warm, dry intact  Dialysis Access: L AVG +bruit  Imaging: CT Head Wo Contrast  Result Date: 06/04/2022 CLINICAL DATA:  Mental status change, unknown cause EXAM: CT HEAD WITHOUT CONTRAST TECHNIQUE: Contiguous axial images were obtained from the base of the skull through the vertex without intravenous contrast. RADIATION DOSE REDUCTION: This exam was performed according to the departmental dose-optimization program which includes automated exposure control, adjustment of the mA and/or kV according to patient size and/or use of iterative reconstruction technique. COMPARISON:  CT head 02/08/2022 FINDINGS: Brain: No evidence of large-territorial acute infarction. No parenchymal hemorrhage. No mass lesion. No extra-axial collection. No mass effect or midline shift. No hydrocephalus. Basilar cisterns are patent. Vascular: No hyperdense vessel. Skull: No acute fracture or focal lesion. Sinuses/Orbits: Paranasal sinuses and mastoid air cells are clear. The orbits are unremarkable. Other: None. IMPRESSION: No acute intracranial abnormality. Electronically Signed   By: Iven Finn M.D.   On: 06/04/2022 01:12   DG Chest Portable 1 View  Result Date: 06/03/2022 CLINICAL DATA:  Altered mental status. EXAM: PORTABLE CHEST 1 VIEW COMPARISON:  02/08/2022. FINDINGS: Heart is enlarged and the mediastinal contour is within normal limits. There is a small right pleural effusion with atelectasis or infiltrate at the right  lung base. Visualized portion of the left lung is clear. No pneumothorax is seen. No acute osseous abnormality. IMPRESSION: 1. Small right pleural effusion with atelectasis or infiltrate. 2. Cardiomegaly. Electronically Signed   By: Brett Fairy M.D.   On: 06/03/2022 22:36    Labs: BMET Recent Labs  Lab 06/03/22 2209 06/03/22 2218 06/04/22 0427 06/04/22 0721 06/04/22 1337 06/04/22 2109  NA 133* 130*  130* 132* 134* 133* 134*  K 5.1 5.0  5.0 3.4* 3.3* 3.6 3.1*  CL 92* 96* 96*   --  96* 98  CO2 15*  --  17*  --  18* 26  GLUCOSE 347* 334* 286*  --  110* 96  BUN 43* 41* 41*  --  44* 20  CREATININE 5.96* 6.40* 5.40*  --  5.70* 3.57*  CALCIUM 7.7*  --  7.2*  --  7.6* 7.4*  PHOS  --   --   --   --  5.8*  --    CBC Recent Labs  Lab 06/03/22 2209 06/03/22 2218 06/04/22 0721 06/04/22 1337  WBC 7.9  --   --  7.1  NEUTROABS 6.3  --   --   --   HGB 12.0 15.0  15.0 13.3 12.4  HCT 36.4 44.0  44.0 39.0 35.9*  MCV 89.9  --   --  86.5  PLT 111*  --   --  123*    Medications:     Chlorhexidine Gluconate Cloth  6 each Topical Q0600   doxercalciferol  1 mcg Intravenous Q M,W,F-HD   heparin  5,000 Units Subcutaneous Q8H   insulin aspart  1-3 Units Subcutaneous Q4H   insulin detemir  5 Units Subcutaneous Q12H   mouth rinse  15 mL Mouth Rinse 4 times per day   oseltamivir  30 mg Oral Daily      Ann Santee, MD 06/05/2022, 8:23 AM

## 2022-06-06 DIAGNOSIS — G9341 Metabolic encephalopathy: Secondary | ICD-10-CM | POA: Diagnosis not present

## 2022-06-06 LAB — COMPREHENSIVE METABOLIC PANEL
ALT: 32 U/L (ref 0–44)
AST: 71 U/L — ABNORMAL HIGH (ref 15–41)
Albumin: 2 g/dL — ABNORMAL LOW (ref 3.5–5.0)
Alkaline Phosphatase: 365 U/L — ABNORMAL HIGH (ref 38–126)
Anion gap: 10 (ref 5–15)
BUN: 23 mg/dL — ABNORMAL HIGH (ref 6–20)
CO2: 27 mmol/L (ref 22–32)
Calcium: 7.4 mg/dL — ABNORMAL LOW (ref 8.9–10.3)
Chloride: 92 mmol/L — ABNORMAL LOW (ref 98–111)
Creatinine, Ser: 4.43 mg/dL — ABNORMAL HIGH (ref 0.44–1.00)
GFR, Estimated: 13 mL/min — ABNORMAL LOW (ref >60)
Glucose, Bld: 54 mg/dL — ABNORMAL LOW (ref 70–99)
Potassium: 3.4 mmol/L — ABNORMAL LOW (ref 3.5–5.1)
Sodium: 129 mmol/L — ABNORMAL LOW (ref 135–145)
Total Bilirubin: 1.4 mg/dL — ABNORMAL HIGH (ref 0.3–1.2)
Total Protein: 5.4 g/dL — ABNORMAL LOW (ref 6.5–8.1)

## 2022-06-06 LAB — GLUCOSE, CAPILLARY
Glucose-Capillary: 100 mg/dL — ABNORMAL HIGH (ref 70–99)
Glucose-Capillary: 113 mg/dL — ABNORMAL HIGH (ref 70–99)
Glucose-Capillary: 141 mg/dL — ABNORMAL HIGH (ref 70–99)
Glucose-Capillary: 149 mg/dL — ABNORMAL HIGH (ref 70–99)
Glucose-Capillary: 28 mg/dL — CL (ref 70–99)
Glucose-Capillary: 53 mg/dL — ABNORMAL LOW (ref 70–99)
Glucose-Capillary: 67 mg/dL — ABNORMAL LOW (ref 70–99)
Glucose-Capillary: 70 mg/dL (ref 70–99)
Glucose-Capillary: 87 mg/dL (ref 70–99)

## 2022-06-06 LAB — CBC
HCT: 29.1 % — ABNORMAL LOW (ref 36.0–46.0)
Hemoglobin: 10.2 g/dL — ABNORMAL LOW (ref 12.0–15.0)
MCH: 30.2 pg (ref 26.0–34.0)
MCHC: 35.1 g/dL (ref 30.0–36.0)
MCV: 86.1 fL (ref 80.0–100.0)
Platelets: 108 10*3/uL — ABNORMAL LOW (ref 150–400)
RBC: 3.38 MIL/uL — ABNORMAL LOW (ref 3.87–5.11)
RDW: 15.9 % — ABNORMAL HIGH (ref 11.5–15.5)
WBC: 4.7 10*3/uL (ref 4.0–10.5)
nRBC: 1.5 % — ABNORMAL HIGH (ref 0.0–0.2)

## 2022-06-06 MED ORDER — LABETALOL HCL 100 MG PO TABS
100.0000 mg | ORAL_TABLET | Freq: Two times a day (BID) | ORAL | Status: DC
  Administered 2022-06-06 – 2022-06-09 (×6): 100 mg via ORAL
  Filled 2022-06-06 (×8): qty 1

## 2022-06-06 MED ORDER — GABAPENTIN 100 MG PO CAPS
100.0000 mg | ORAL_CAPSULE | Freq: Every day | ORAL | Status: DC
  Administered 2022-06-06 – 2022-06-09 (×3): 100 mg via ORAL
  Filled 2022-06-06 (×4): qty 1

## 2022-06-06 MED ORDER — OXYCODONE-ACETAMINOPHEN 5-325 MG PO TABS
1.0000 | ORAL_TABLET | Freq: Four times a day (QID) | ORAL | Status: DC | PRN
  Administered 2022-06-06 – 2022-06-08 (×7): 1 via ORAL
  Filled 2022-06-06 (×7): qty 1

## 2022-06-06 MED ORDER — INSULIN DETEMIR 100 UNIT/ML ~~LOC~~ SOLN
2.0000 [IU] | Freq: Every day | SUBCUTANEOUS | Status: DC
  Filled 2022-06-06: qty 0.02

## 2022-06-06 MED ORDER — INSULIN ASPART 100 UNIT/ML IJ SOLN
0.0000 [IU] | INTRAMUSCULAR | Status: DC
  Administered 2022-06-07: 2 [IU] via SUBCUTANEOUS
  Administered 2022-06-08: 1 [IU] via SUBCUTANEOUS
  Administered 2022-06-08: 2 [IU] via SUBCUTANEOUS
  Administered 2022-06-08: 1 [IU] via SUBCUTANEOUS
  Administered 2022-06-08: 2 [IU] via SUBCUTANEOUS
  Administered 2022-06-09: 3 [IU] via SUBCUTANEOUS

## 2022-06-06 MED ORDER — HYDROCORTISONE 1 % EX CREA
TOPICAL_CREAM | Freq: Three times a day (TID) | CUTANEOUS | Status: DC
  Filled 2022-06-06: qty 28

## 2022-06-06 NOTE — Progress Notes (Signed)
Hypoglycemic Event  CBG:67  Treatment: 8oz juice/soda  Symptoms: none  Follow-up CBG: Time:1745 CBG Result 142  Possible Reasons for Event: inadequate meal intake  Comments/MD notified:Attending MD made aware with no new orders at this time.    Mellody Dance, Javaya [place]

## 2022-06-06 NOTE — Progress Notes (Signed)
PT Cancellation Note  Patient Details Name: Ann Robertson MRN: [id number] DOB: Sep 12, 1990   Cancelled Treatment:    Reason Eval/Treat Not Completed: Patient declined, she did not feel up for walking right now.  Requested PT come back tomorrow AM.   Verdene Lennert, PT, DPT  Acute Rehabilitation Secure chat is best for contact #[phone] office     Harvie Heck 06/06/2022, 5:34 PM

## 2022-06-06 NOTE — Plan of Care (Signed)
  Problem: Education: Goal: Knowledge of General Education information will improve Description: Including pain rating scale, medication(s)/side effects and non-pharmacologic comfort measures Outcome: Progressing   Problem: Clinical Measurements: Goal: Will remain free from infection Outcome: Progressing   Problem: Activity: Goal: Risk for activity intolerance will decrease Outcome: Progressing   Problem: Education: Goal: Ability to describe self-care measures that may prevent or decrease complications (Diabetes Survival Skills Education) will improve Outcome: Progressing   Problem: Fluid Volume: Goal: Ability to maintain a balanced intake and output will improve Outcome: Progressing   Problem: Nutritional: Goal: Maintenance of adequate nutrition will improve Outcome: Progressing

## 2022-06-06 NOTE — Progress Notes (Signed)
[facility] Progress Note   32 y.o. female with ESRD on HD MWF, T1DM who is admitted with DKA found altered. On arrival labs notable for Na 133, K 5.1, CO2 15, AG 26. She was also Flu B positive. DKA protocol started in the ED, and she did get bolused quite a bit of IVFs. Insulin drip started.    Dialysis MWF at [facility]. She did miss the scheduled holiday dialysis on Sunday 12/31. Her last dialysis was Wednesday 1/3 -she completed 2:41.  She was seen by a provider and her dry weight was lowered. They noted that she looked awful and was confused at that treatment.   Dialysis Orders:  Unit: [place] MWF  Time: 4:00  EDW: 49 kg  Flows: 400/500 Bath: 2K/2Ca  Access: AVG  Heparin: 2500 bolus  ESA: Mircera 120 q 4 wks (last 12/24) VDRA: Hectorol 1 TIW   Assessment/ Plan:    DKA/T1DM - insulin per primary team. Admitted to ICU while on insulin drip; off now.  ESRD -  HD MWF. Tolerated HD 1/5 with 3L net UF. Next HD on Monday  Hypertension/volume  - Chronic volume overload.  Anemia  - Hb above goal. No ESA needs currently. Follow trends   Metabolic bone disease -  Corr Ca ok. Continue home binders/VDRA   Nutrition - Renal diet with fluid restriction.   Subjective:   Transferred out of ICU. Off insulin gtt. Says she's been coughing, some nausea with eating    Objective:   BP (!) 153/100 (BP Location: Right Arm)   Pulse 80   Temp 97.8 F (36.6 C) (Oral)   Resp 14   Ht 5\' 5"  (1.651 m)   Wt 52.4 kg   SpO2 100%   BMI 19.22 kg/m   Intake/Output Summary (Last 24 hours) at 06/06/2022 0942 Last data filed at 06/06/2022 0045 Gross per 24 hour  Intake 931.53 ml  Output --  Net 931.53 ml    Weight change:   Physical Exam: General: Young woman, chronically ill appearing  Head: NCAT sclera not icteric MMM Neck: Supple. No JVD appreciated  Lungs: Clear bilaterally  Heart: RRR, no murmur, rub, or gallop  Abdomen: soft non-tender, bowel sounds normal, no  masses  Lower extremities: pitting edema to LE  Neuro: Obtunded, minimal response to voice  Skin: warm, dry intact  Dialysis Access: L AVG +bruit  Imaging: No results found.  Labs: BMET Recent Labs  Lab 06/03/22 2209 06/03/22 2218 06/04/22 0427 06/04/22 0721 06/04/22 1337 06/04/22 2109 06/05/22 0808 06/06/22 0302  NA 133* 130*  130* 132* 134* 133* 134* 134* 129*  K 5.1 5.0  5.0 3.4* 3.3* 3.6 3.1* 3.6 3.4*  CL 92* 96* 96*  --  96* 98 95* 92*  CO2 15*  --  17*  --  18* 26 25 27   GLUCOSE 347* 334* 286*  --  110* 96 101* 54*  BUN 43* 41* 41*  --  44* 20 21* 23*  CREATININE 5.96* 6.40* 5.40*  --  5.70* 3.57* 4.05* 4.43*  CALCIUM 7.7*  --  7.2*  --  7.6* 7.4* 7.4* 7.4*  PHOS  --   --   --   --  5.8*  --   --   --     CBC Recent Labs  Lab 06/03/22 2209 06/03/22 2218 06/04/22 0721 06/04/22 1337 06/05/22 0808 06/06/22 0302  WBC 7.9  --   --  7.1 5.2 4.7  NEUTROABS 6.3  --   --   --   --   --  HGB 12.0   < > 13.3 12.4 10.9* 10.2*  HCT 36.4   < > 39.0 35.9* 32.6* 29.1*  MCV 89.9  --   --  86.5 86.5 86.1  PLT 111*  --   --  123* 88* 108*   < > = values in this interval not displayed.     Medications:     Chlorhexidine Gluconate Cloth  6 each Topical Q0600   doxercalciferol  1 mcg Intravenous Q M,W,F-HD   gabapentin  100 mg Oral Daily   heparin  5,000 Units Subcutaneous Q8H   insulin aspart  0-4 Units Subcutaneous Q4H   insulin detemir  2 Units Subcutaneous QHS   labetalol  100 mg Oral BID   mouth rinse  15 mL Mouth Rinse 4 times per day   [START ON 06/07/2022] oseltamivir  30 mg Oral Q M,W,F-1800   Lynnda Child PA-C  [facility] 06/06/2022,9:42 AM

## 2022-06-06 NOTE — Inpatient Diabetes Management (Signed)
Inpatient Diabetes Program Recommendations  AACE/ADA: New Consensus Statement on Inpatient Glycemic Control   Target Ranges:  Prepandial:   less than 140 mg/dL      Peak postprandial:   less than 180 mg/dL (1-2 hours)      Critically ill patients:  140 - 180 mg/dL    Latest Reference Range & Units 06/06/22 00:47 06/06/22 06:03 06/06/22 06:48 06/06/22 08:47 06/06/22 09:29  Glucose-Capillary 70 - 99 mg/dL 70 28 (LL) 87 53 (L) 113 (H)    Latest Reference Range & Units 06/05/22 12:26 06/05/22 15:04 06/05/22 18:23 06/05/22 18:47 06/05/22 19:36 06/06/22 00:47  Glucose-Capillary 70 - 99 mg/dL 115 (H) 81 67 (L) 76 121 (H) 70    Latest Reference Range & Units 06/05/22 00:02 06/05/22 01:03 06/05/22 03:19 06/05/22 03:48 06/05/22 07:09 06/05/22 11:24  Glucose-Capillary 70 - 99 mg/dL 114 (H) 106 (H) 45 (L) 157 (H) 81 26 (LL)   Review of Glycemic Control  Diabetes history: DM1 (does not make any insulin) Outpatient Diabetes medications: Lantus 3 units QHS, Humalog 1-3 units TID  Current orders for Inpatient glycemic control: Levemir 2 units QHS, Novolog 0-4 units Q4H  Inpatient Diabetes Program Recommendations:    Insulin: Noted patient received Levemir 3 units at 23:46 on 06/05/22 and patient has continued to experience hypoglycemia. May want to consider discontinuing Levemir 2 units QHS and continue custom correction scale Novolog 0-4 units Q4H.   NOTE: Noted consult for Diabetes Coordinator. Diabetes Coordinator is not on campus over the weekend but available by pager from 8am to 5pm for questions or concerns. Chart reviewed. Noted patient has experienced hypoglycemia several times over the past 24 hours despite decrease in Levemir. Patient received Levemir 3 units at 23:46 on 06/05/22 and noted glucose 28 mg/dl at 6:03 am today. May want to consider discontinuing Levemir and continuing custom Novolog correction 0-4 units Q4H.  Thanks, Barnie Alderman, RN, MSN, [place] Diabetes Coordinator Inpatient  Diabetes Program [phone] (Team Pager from 8am to [place])

## 2022-06-06 NOTE — Plan of Care (Signed)

## 2022-06-06 NOTE — Progress Notes (Signed)
Got some new orders from attending Gershon Cull, pt CBG now is 87 after standard protocol initiated, eating her breakfast now, will continue to monitor

## 2022-06-06 NOTE — Progress Notes (Signed)
Date and time results received: 06/06/22 0603 (use smartphrase ".now" to insert current time)  Test: CBG Critical Value: 28  Name of Provider Notified: attending hospt. Tru secure chart  Orders Received? Or Actions Taken?: following standard protocol now and waiting

## 2022-06-06 NOTE — Progress Notes (Signed)
Hypoglycemic Event  CBG:53  Treatment: D50 52ml  Symptoms:lethargic  Follow-up CBG: [id number] CBG Result:113  Possible Reasons for Event: inadequate meal intake  Comments/MD notified:Attending MD made aware.with no new orders at this time.    Mellody Dance, Alysiah [place]

## 2022-06-06 NOTE — Progress Notes (Signed)
PROGRESS NOTE  Brief Narrative: Ann Robertson is a 32 y.o. female with a history of T1DM, ESRD (HD MWF), and HTN who was brought in by EMS after being found obtunded by family. She has remained somnolent, unable to give history, though she is known to have missed HD sessions. Labs demonstrated DKA with lactic acidosis and +influenza B PCR. CXR shows cardiomegaly with right pleural effusion/opacity. CT head nonacute. She was admitted with DKA on insulin infusion with nephrology consulted to initiate HD today. Lactic acid 5.7. After IVF, she appeared volume overloaded, so was admitted for urgent HD in ICU with ongoing insulin infusion. Acidosis and ketones cleared and transitioned to basal-bolus insulin with hypoglycemia. STruggling with hypoglycemia requiring dextrose infusion. Encephalopathy improved. Next HD planned 1/8.  Subjective: Sitting up in bed eating a bit, stating her burning neuropathy pain in her feet is worsening. Also having her chronic pain including her back without numbness or weakness. Has a cough. Did not know she had the flu.   Objective: BP (!) 153/100 (BP Location: Right Arm)   Pulse 80   Temp 97.8 F (36.6 C) (Oral)   Resp 14   Ht 5\' 5"  (1.651 m)   Wt 52.4 kg   SpO2 100%   BMI 19.22 kg/m   Gen: No distress Pulm: Coarse, nonlabored  CV: RRR GI: Soft, NT, ND, +BS  Neuro: Alert, interactive, cooperative and oriented. No new focal deficits. Ext: Warm, no deformities Skin: No new rashes, lesions or ulcers on visualized skin   Assessment & Plan: Acute metabolic encephalopathy: Due to uremia and DKA, has improved.  - Now that she's more alert and having pain, will restart home medications. Restart half dose opioid, renal dose gabapentin, hold muscle relaxer  - PT/OT consulted as pt was found down and appears diffusely weak.  T1DM, DKA: DKA resolved, now having hypoglycemia with minimal/no insulin administration - Encourage adequate po diet, hold further basal  insulin for now.  - We're having to give dextrose infusion, will monitor volume status closely. Relatively stable. Will continue prn D50 pushes as well.   ESRD: MWF schedule thru [facility]. - s/p HD for volume overload 1/5, next currently planned for 1/8.   Influenza infection:  - Continue tamiflu dosed for ESRD.  Patrecia Pour, MD Pager on amion 06/06/2022, 1:22 PM

## 2022-06-07 DIAGNOSIS — G9341 Metabolic encephalopathy: Secondary | ICD-10-CM | POA: Diagnosis not present

## 2022-06-07 LAB — COMPREHENSIVE METABOLIC PANEL
ALT: 26 U/L (ref 0–44)
AST: 49 U/L — ABNORMAL HIGH (ref 15–41)
Albumin: 1.9 g/dL — ABNORMAL LOW (ref 3.5–5.0)
Alkaline Phosphatase: 318 U/L — ABNORMAL HIGH (ref 38–126)
Anion gap: 9 (ref 5–15)
BUN: 30 mg/dL — ABNORMAL HIGH (ref 6–20)
CO2: 26 mmol/L (ref 22–32)
Calcium: 6.9 mg/dL — ABNORMAL LOW (ref 8.9–10.3)
Chloride: 87 mmol/L — ABNORMAL LOW (ref 98–111)
Creatinine, Ser: 4.82 mg/dL — ABNORMAL HIGH (ref 0.44–1.00)
GFR, Estimated: 12 mL/min — ABNORMAL LOW (ref >60)
Glucose, Bld: 158 mg/dL — ABNORMAL HIGH (ref 70–99)
Potassium: 3.8 mmol/L (ref 3.5–5.1)
Sodium: 122 mmol/L — ABNORMAL LOW (ref 135–145)
Total Bilirubin: 1.4 mg/dL — ABNORMAL HIGH (ref 0.3–1.2)
Total Protein: 5.1 g/dL — ABNORMAL LOW (ref 6.5–8.1)

## 2022-06-07 LAB — CBC
HCT: 30.3 % — ABNORMAL LOW (ref 36.0–46.0)
Hemoglobin: 9.9 g/dL — ABNORMAL LOW (ref 12.0–15.0)
MCH: 29 pg (ref 26.0–34.0)
MCHC: 32.7 g/dL (ref 30.0–36.0)
MCV: 88.9 fL (ref 80.0–100.0)
Platelets: 114 10*3/uL — ABNORMAL LOW (ref 150–400)
RBC: 3.41 MIL/uL — ABNORMAL LOW (ref 3.87–5.11)
RDW: 16.1 % — ABNORMAL HIGH (ref 11.5–15.5)
WBC: 3.9 10*3/uL — ABNORMAL LOW (ref 4.0–10.5)
nRBC: 2.8 % — ABNORMAL HIGH (ref 0.0–0.2)

## 2022-06-07 LAB — GLUCOSE, CAPILLARY
Glucose-Capillary: 130 mg/dL — ABNORMAL HIGH (ref 70–99)
Glucose-Capillary: 133 mg/dL — ABNORMAL HIGH (ref 70–99)
Glucose-Capillary: 178 mg/dL — ABNORMAL HIGH (ref 70–99)
Glucose-Capillary: 183 mg/dL — ABNORMAL HIGH (ref 70–99)
Glucose-Capillary: 190 mg/dL — ABNORMAL HIGH (ref 70–99)
Glucose-Capillary: 195 mg/dL — ABNORMAL HIGH (ref 70–99)
Glucose-Capillary: 217 mg/dL — ABNORMAL HIGH (ref 70–99)
Glucose-Capillary: 257 mg/dL — ABNORMAL HIGH (ref 70–99)
Glucose-Capillary: 63 mg/dL — ABNORMAL LOW (ref 70–99)
Glucose-Capillary: 83 mg/dL (ref 70–99)
Glucose-Capillary: 97 mg/dL (ref 70–99)
Glucose-Capillary: 98 mg/dL (ref 70–99)
Glucose-Capillary: 99 mg/dL (ref 70–99)

## 2022-06-07 MED ORDER — SUCROFERRIC OXYHYDROXIDE 500 MG PO CHEW
500.0000 mg | CHEWABLE_TABLET | Freq: Three times a day (TID) | ORAL | Status: DC
  Administered 2022-06-08 – 2022-06-09 (×2): 500 mg via ORAL
  Filled 2022-06-07 (×2): qty 1

## 2022-06-07 NOTE — Progress Notes (Deleted)
Mobility Specialist Progress Note   06/07/22 1246  Mobility  Activity Ambulated with assistance in hallway  Level of Assistance Minimal assist, patient does 75% or more  Assistive Device Four wheel walker (Rollator)  Distance Ambulated (ft) 120 ft  Activity Response Tolerated well  Mobility Referral Yes  $Mobility charge 1 Mobility   Pt found setting off bed alarm d/t present spasm in back and wanting to change positions for relief, agreeable to walk. Pt presenting w/ LE general weakness, impulsivity and flexed trunk while ambulating. Required MinA d/t x2 bouts of buckling + mod cues to donn brace, acknowledge body mechanics, reiterate safe Rollator mechanics and safety while ambulating. X1 seated break d/t fatigue, returned back to chair w/ all needs met and family present in room.     Holland Falling Mobility Specialist Please contact via SecureChat or  Rehab office at [phone]

## 2022-06-07 NOTE — Progress Notes (Signed)
OT Cancellation Note  Patient Details Name: Ann Robertson MRN: [id number] DOB: 08/24/1990   Cancelled Treatment:    Reason Eval/Treat Not Completed: Patient declined, no reason specified  Malka So 06/07/2022, 1:16 PM Cleta Alberts, OTR/L Acute Rehabilitation Services Office: [phone]

## 2022-06-07 NOTE — Progress Notes (Signed)
PT Cancellation Note  Patient Details Name: Ann Robertson MRN: [id number] DOB: [birth year]   Cancelled Treatment:    Reason Eval/Treat Not Completed: Patient declined, no reason specified. Pt continues to refuse PT. Will attempt one more time tomorrow and sign off if pt not agreeable.   Leighton Roach, PT  Acute Rehab Services Secure chat preferred Office [place] 06/07/2022, 11:29 AM

## 2022-06-07 NOTE — Plan of Care (Signed)

## 2022-06-07 NOTE — Progress Notes (Signed)
PT Cancellation Note  Patient Details Name: Ann Robertson MRN: [id number] DOB: 25-Feb-1991   Cancelled Treatment:    Reason Eval/Treat Not Completed: Patient declined, no reason specified. Pt reports she is too cold to get up. Will check back one more time today  Leighton Roach, PT  Acute Rehab Services Secure chat preferred Office [place] 06/07/2022, 9:43 AM

## 2022-06-07 NOTE — Progress Notes (Signed)
Initial Nutrition Assessment  DOCUMENTATION CODES:   Non-severe (moderate) malnutrition in context of chronic illness  INTERVENTION:  Liberalize diet from a renal to a 2 gram sodium diet to provide widest variety of menu options to enhance nutritional adequacy Double protein portions with all meals Renal MVI with minerals daily  NUTRITION DIAGNOSIS:   Moderate Malnutrition related to chronic illness (T1DM, ESRD) as evidenced by moderate fat depletion, severe muscle depletion, moderate muscle depletion.  GOAL:   Patient will meet greater than or equal to 90% of their needs  MONITOR:   PO intake, Supplement acceptance, Labs, Weight trends, I & O's  REASON FOR ASSESSMENT:   Malnutrition Screening Tool    ASSESSMENT:   Pt admitted with acute metabolic encephalopathy secondary to DKA. Also found to be positive for influenza. PMH significant for T1DM, HLD, ESRD on HD.  Pt was planned for HD yesterday however refused session and plans for HD today.   Pt noted to have recurrent hypoglycemia throughout admission and was placed on D10 but this had been discontinued yesterday. Per MD note, pt declining consideration of Cortrak.   Spoke with pt at bedside. She mentions having difficulty talking d/t cough which is eased by warm compresses. 2 new warm compresses provided per pt request.   She states that she has been eating poorly during admission as she has not had an appetite. She was eating a meal at time of visit and had eaten about half of each item provided in her meal tray. She states that her appetite is slightly improving. Pt recalls eating on average 2 meals per day at home and denies significant changes to her PO intake.   Pt states that she has 4 children at home (ages 56,7, 76 and 42) and they keep her fairly busy.   Encouraged adequate PO intake. Discussed adding nutrition supplements. She declines taking these. When asked why, she states "what's the point, they will only help  me for 10 seconds and won't make a difference." Educated on the importance of maintaining adequate PO intake especially given chronic illnesses.  Pt reports that her Nephrologist had just recently changed EDW prior to admission but couldn't remember what it was. Per review of chart, EDW 49 kg.   Medications: hectorol, SSI 0-4 units q4h, velphro 500mg  TID  Labs:  sodium 122, potassium 3.8 (wdl), BUN 30, Cr 4.82, corrected calcium 8.58, alkaline phos 318, AST 49, GFR 12, HgbA1c 14.3% (02/08/22), CBG's 83-257 x24 hours  NUTRITION - FOCUSED PHYSICAL EXAM:  Flowsheet Row Most Recent Value  Orbital Region Moderate depletion  Upper Arm Region Severe depletion  Thoracic and Lumbar Region Moderate depletion  Buccal Region Mild depletion  Temple Region Moderate depletion  Clavicle Bone Region Severe depletion  Clavicle and Acromion Bone Region Severe depletion  Scapular Bone Region Moderate depletion  Dorsal Hand Moderate depletion  Patellar Region Severe depletion  Anterior Thigh Region Severe depletion  Posterior Calf Region Moderate depletion  Edema (RD Assessment) None  Hair Reviewed  Eyes Reviewed  Mouth Reviewed  Skin Reviewed  Nails Reviewed       Diet Order:   Diet Order             Diet 2 gram sodium Fluid consistency: Thin; Fluid restriction: 1200 mL Fluid  Diet effective now                   EDUCATION NEEDS:   Education needs have been addressed  Skin:  Skin Assessment: Skin Integrity  Issues: Skin Integrity Issues:: DTI DTI: ischial tuberosity  Last BM:  1/8 type 7  Height:   Ht Readings from Last 1 Encounters:  06/04/22 5\' 5"  (1.651 m)    Weight:   Wt Readings from Last 1 Encounters:  06/05/22 52.4 kg   BMI:  Body mass index is 19.22 kg/m.  Estimated Nutritional Needs:   Kcal:  1600-1800  Protein:  80-95g  Fluid:  1L + UOP  Clayborne Dana, RDN, LDN Clinical Nutrition

## 2022-06-07 NOTE — Progress Notes (Signed)
patient has been refusing scheduled heparin SQ 5000 units since yesterday evening, she  refused HD this evening saying , its too late ,she wants to do it tomorrow. education giving to pt, attending notified via secure chat, will continue to monitor patient.

## 2022-06-07 NOTE — Progress Notes (Signed)
Subjective: Seen in room, only complaint is wants to rest more.  No shortness of breath or chest pain for HD today  Objective Vital signs in last 24 hours: Vitals:   06/06/22 2020 06/06/22 2025 06/07/22 0848 06/07/22 0931  BP:  124/80 (!) 147/95 (!) 148/89  Pulse:  79 76 74  Resp: 18 15 16 16   Temp:  97.8 F (36.6 C) 98 F (36.7 C) (!) 97.3 F (36.3 C)  TempSrc:  Oral Oral Oral  SpO2:  99%  100%  Weight:      Height:       Weight change:   Physical Exam: General: Thin chronically ill adult female NAD Heart: RRR no MRG Lungs: CTA bilaterally nonlabored breathing Abdomen: NABS, soft NTND Extremities: Trace pedal edema  dialysis Access: Left arm AVG positive bruit thrill     Op  dialysis Orders:  Unit: [place] MWF  Time: 4:00  EDW: 49 kg  Flows: 400/500 Bath: 2K/2Ca  Access: AVG  Heparin: 2500 bolus  ESA: Mircera 120 q 4 wks (last 12/24) VDRA: Hectorol 1 TIW   Problem/Plan: DKA/T1DM= plan per admit team, initially was admitted to ICU on insulin drip, now transferred to floor Acute metabolic encephalopathy due to uremia and DKA-solving about at baseline . 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 will give Aranesp subcu today Secondary hyperparathyroidism -corrected calcium okay phosphorus 5.8 continue Velphoro binder with meals and Hectorol on HD   Ernest Haber, PA-C [facility] [phone] 06/07/2022,2:56 PM  LOS: 3 days   Labs: Basic Metabolic Panel: Recent Labs  Lab 06/04/22 1337 06/04/22 2109 06/05/22 0808 06/06/22 0302 06/07/22 0605  NA 133*   < > 134* 129* 122*  K 3.6   < > 3.6 3.4* 3.8  CL 96*   < > 95* 92* 87*  CO2 18*   < > 25 27 26   GLUCOSE 110*   < > 101* 54* 158*  BUN 44*   < > 21* 23* 30*  CREATININE 5.70*   < > 4.05* 4.43* 4.82*  CALCIUM 7.6*   < > 7.4* 7.4* 6.9*  PHOS  5.8*  --   --   --   --    < > = values in this interval not displayed.   Liver Function Tests: Recent Labs  Lab 06/05/22 0808 06/06/22 0302 06/07/22 0605  AST 91* 71* 49*  ALT 38 32 26  ALKPHOS 358* 365* 318*  BILITOT 1.1 1.4* 1.4*  PROT 5.2* 5.4* 5.1*  ALBUMIN 2.0* 2.0* 1.9*   No results for input(s): "LIPASE", "AMYLASE" in the last 168 hours. Recent Labs  Lab 06/03/22 2209  AMMONIA 62*   CBC: Recent Labs  Lab 06/03/22 2209 06/03/22 2218 06/04/22 1337 06/05/22 0808 06/06/22 0302 06/07/22 0605  WBC 7.9  --  7.1 5.2 4.7 3.9*  NEUTROABS 6.3  --   --   --   --   --   HGB 12.0   < > 12.4 10.9* 10.2* 9.9*  HCT 36.4   < > 35.9* 32.6* 29.1* 30.3*  MCV 89.9  --  86.5 86.5 86.1 88.9  PLT 111*  --  123* 88* 108* 114*   < > = values in this interval not displayed.   Cardiac Enzymes: Recent Labs  Lab 06/03/22 2209  CKTOTAL 278*  CBG: Recent Labs  Lab 06/07/22 0006 06/07/22 0455 06/07/22 0842 06/07/22 0933 06/07/22 1217  GLUCAP 83 133* 190* 195* 257*    Studies/Results: No results found. Medications:   Chlorhexidine Gluconate Cloth  6 each Topical Q0600   doxercalciferol  1 mcg Intravenous Q M,W,F-HD   gabapentin  100 mg Oral Daily   heparin  5,000 Units Subcutaneous Q8H   hydrocortisone cream   Topical TID   insulin aspart  0-4 Units Subcutaneous Q4H   labetalol  100 mg Oral BID   mouth rinse  15 mL Mouth Rinse 4 times per day   oseltamivir  30 mg Oral Q M,W,F-1800

## 2022-06-07 NOTE — Progress Notes (Signed)
PROGRESS NOTE  Brief Narrative: Ann Robertson is a 32 y.o. female with a history of T1DM, ESRD (HD MWF), and HTN who was brought in by EMS after being found obtunded by family. She has remained somnolent, unable to give history, though she is known to have missed HD sessions. Labs demonstrated DKA with lactic acidosis and +influenza B PCR. CXR shows cardiomegaly with right pleural effusion/opacity. CT head nonacute. She was admitted with DKA on insulin infusion with nephrology consulted to initiate HD today. Lactic acid 5.7. After IVF, she appeared volume overloaded, so was admitted for urgent HD in ICU with ongoing insulin infusion. Acidosis and ketones cleared and transitioned to basal-bolus insulin with hypoglycemia. STruggling with hypoglycemia requiring dextrose infusion. Encephalopathy improved. Next HD planned 1/8.  Subjective: Says she feels like she's eating enough, but still having low blood sugar. Will not consider cortrak, etc. Hasn't gotten out of bed. Still with cough and feeling ill. Feels like she needs to get some rest before eating or doing anything else.  Objective: BP (!) 148/89 (BP Location: Right Arm)   Pulse 74   Temp (!) 97.3 F (36.3 C) (Oral)   Resp 16   Ht 5\' 5"  (1.651 m)   Wt 52.4 kg   SpO2 100%   BMI 19.22 kg/m   Gen: Appearing older than stated age Pulm: Coarse, nonlabored  CV: RRR, no MRG. 1+ edema GI: Soft, NT, ND, +BS  Neuro: Alert and oriented. Mostly cooperative but not completely and effort lacking. No new focal deficits. Ext: Warm, no deformities Skin: No rashes, lesions or ulcers on visualized skin   Assessment & Plan: Acute metabolic encephalopathy: Due to uremia and DKA, has improved.  - Restarted half dose opioid, renal dose gabapentin, hold muscle relaxer  - PT/OT consulted as pt was found down and appears diffusely weak. Has declined evaluations despite encouragement.  T1DM, DKA: DKA resolved, now having hypoglycemia with minimal/no insulin  administration - Encourage adequate po diet, hold further basal insulin for now.  - Continue close monitoring of CBGs, glucose finally up a bit today, so will stop D10 infusion. Declines alternative routes of nutrition.  ESRD: MWF schedule thru L[facility] AVG. - s/p HD for volume overload 1/5, next currently planned for 1/8.   Influenza infection:  - Continue tamiflu dosed for ESRD.  Patrecia Pour, MD Pager on amion 06/07/2022, 2:21 PM

## 2022-06-07 NOTE — Progress Notes (Signed)
Pt receives out-pt HD at [facility] on MWF. Will assist as needed.   Aaran Enberg Renal Navigator [phone] 

## 2022-06-08 ENCOUNTER — Encounter (HOSPITAL_COMMUNITY): Payer: Self-pay | Admitting: Family Medicine

## 2022-06-08 DIAGNOSIS — G9341 Metabolic encephalopathy: Secondary | ICD-10-CM | POA: Diagnosis not present

## 2022-06-08 DIAGNOSIS — E440 Moderate protein-calorie malnutrition: Secondary | ICD-10-CM | POA: Insufficient documentation

## 2022-06-08 LAB — GLUCOSE, CAPILLARY
Glucose-Capillary: 210 mg/dL — ABNORMAL HIGH (ref 70–99)
Glucose-Capillary: 289 mg/dL — ABNORMAL HIGH (ref 70–99)
Glucose-Capillary: 277 mg/dL — ABNORMAL HIGH (ref 70–99)

## 2022-06-08 LAB — CBC
HCT: 30 % — ABNORMAL LOW (ref 36.0–46.0)
Hemoglobin: 9.5 g/dL — ABNORMAL LOW (ref 12.0–15.0)
MCH: 29 pg (ref 26.0–34.0)
MCHC: 31.7 g/dL (ref 30.0–36.0)
MCV: 91.5 fL (ref 80.0–100.0)
Platelets: 141 10*3/uL — ABNORMAL LOW (ref 150–400)
RBC: 3.28 MIL/uL — ABNORMAL LOW (ref 3.87–5.11)
RDW: 15.8 % — ABNORMAL HIGH (ref 11.5–15.5)
WBC: 5.2 10*3/uL (ref 4.0–10.5)
nRBC: 0.6 % — ABNORMAL HIGH (ref 0.0–0.2)

## 2022-06-08 LAB — COMPREHENSIVE METABOLIC PANEL
ALT: 25 U/L (ref 0–44)
AST: 42 U/L — ABNORMAL HIGH (ref 15–41)
Albumin: 2 g/dL — ABNORMAL LOW (ref 3.5–5.0)
Alkaline Phosphatase: 322 U/L — ABNORMAL HIGH (ref 38–126)
Anion gap: 11 (ref 5–15)
BUN: 41 mg/dL — ABNORMAL HIGH (ref 6–20)
CO2: 23 mmol/L (ref 22–32)
Calcium: 7.5 mg/dL — ABNORMAL LOW (ref 8.9–10.3)
Chloride: 87 mmol/L — ABNORMAL LOW (ref 98–111)
Creatinine, Ser: 5.57 mg/dL — ABNORMAL HIGH (ref 0.44–1.00)
GFR, Estimated: 10 mL/min — ABNORMAL LOW (ref >60)
Glucose, Bld: 199 mg/dL — ABNORMAL HIGH (ref 70–99)
Potassium: 3.7 mmol/L (ref 3.5–5.1)
Sodium: 121 mmol/L — ABNORMAL LOW (ref 135–145)
Total Bilirubin: 1.3 mg/dL — ABNORMAL HIGH (ref 0.3–1.2)
Total Protein: 5.3 g/dL — ABNORMAL LOW (ref 6.5–8.1)

## 2022-06-08 MED ORDER — HEPARIN SODIUM (PORCINE) 1000 UNIT/ML IJ SOLN
2500.0000 [IU] | Freq: Once | INTRAMUSCULAR | Status: AC
  Administered 2022-06-08: 2500 [IU] via INTRAVENOUS

## 2022-06-08 MED ORDER — OSELTAMIVIR PHOSPHATE 30 MG PO CAPS
30.0000 mg | ORAL_CAPSULE | Freq: Once | ORAL | Status: DC
  Filled 2022-06-08 (×2): qty 1

## 2022-06-08 MED ORDER — DARBEPOETIN ALFA 100 MCG/0.5ML IJ SOSY
100.0000 ug | PREFILLED_SYRINGE | INTRAMUSCULAR | Status: DC
  Filled 2022-06-08: qty 0.5

## 2022-06-08 NOTE — Progress Notes (Signed)
Received patient in bed to unit.  Alert and oriented.  Informed consent signed and in chart.   Treatment initiated: 08:46 Treatment completed: 11:53  Patient tolerated well.  Transported back to the room  Alert, without acute distress.  Hand-off given to patient's nurse.   Access used: Left AVG Access issues: none  Total UF removed: 3.5L Medication(s) given: percocet     06/08/22 1153  Vitals  Temp 97.8 F (36.6 C)  Temp Source Oral  BP 123/65  MAP (mmHg) 83  BP Location Right Arm  BP Method Automatic  Patient Position (if appropriate) Lying  Pulse Rate 84  Pulse Rate Source Monitor  ECG Heart Rate 84  Resp 20  Oxygen Therapy  SpO2 99 %  O2 Device Room Air  Patient Activity (if Appropriate) In bed  During Treatment Monitoring  Blood Flow Rate (mL/min) 200 mL/min  Intra-Hemodialysis Comments Tolerated well;Tx completed  Dialysis Fluid Bolus Normal Saline  Bolus Amount (mL) 300 mL  Post Treatment  Dialyzer Clearance Lightly streaked  Duration of HD Treatment -hour(s) 3 hour(s)  Hemodialysis Intake (mL) 0 mL  Liters Processed 72  Fluid Removed (mL) 3500 mL  Tolerated HD Treatment Yes  Post-Hemodialysis Comments Pt completed 3 hours of tx, UF goal met, Pt graft deaccessed with no complications  AVG/AVF Arterial Site Held (minutes) 8 minutes  AVG/AVF Venous Site Held (minutes) 8 minutes      Marshun Duva S [facility]

## 2022-06-08 NOTE — Progress Notes (Signed)
PROGRESS NOTE  Brief Narrative: Ann Robertson is a 32 y.o. female with a history of T1DM, ESRD (HD MWF), and HTN who was brought in by EMS after being found obtunded by family. She has remained somnolent, unable to give history, though she is known to have missed HD sessions. Labs demonstrated DKA with lactic acidosis and +influenza B PCR. CXR shows cardiomegaly with right pleural effusion/opacity. CT head nonacute. She was admitted with DKA on insulin infusion with nephrology consulted to initiate HD today. Lactic acid 5.7. After IVF, she appeared volume overloaded, so was admitted for urgent HD in ICU with ongoing insulin infusion. Acidosis and ketones cleared and transitioned to basal-bolus insulin with hypoglycemia which persisted requiring dextrose infusion until 1/8. Encephalopathy improved. Still awaiting PT/OT evaluations.  Subjective: Seen in HD, says she's actually hungry but only wants to sleep/rest. No fever, chills, but feels fatigued and diffusely weak. Got up to go to the bathroom yesterday, but no other times.  Objective: BP (!) 142/84 (BP Location: Right Arm)   Pulse 85   Temp 98.2 F (36.8 C) (Oral)   Resp 14   Ht 5\' 5"  (1.651 m)   Wt 52.5 kg   SpO2 100%   BMI 19.26 kg/m   Gen: 32yo F appearing older, undergoing dialysis in no distress Pulm: Nonlabored, clear  CV: RRR, no MRG. Edema is 1+. GI: Soft, NT, ND, +BS  Neuro: Drowsy but rousable to full alertness, oriented. No new focal deficits. Ext: Warm, no deformities Skin: LUA accessed. No rashes, lesions or ulcers on visualized skin   Assessment & Plan: Acute metabolic encephalopathy: Due to uremia and DKA, has resolved. - Restarted half dose opioid, renal dose gabapentin, holding muscle relaxer  - PT/OT consulted as pt was found down and appears diffusely weak.   T1DM, DKA: DKA resolved, then had persistent hypoglycemia requiring dextrose infusion - Encourage adequate po diet. If glucose trends continue to rise,  would consider restarting basal insulin. However, she is extremely insulin-sensitive, so will confirm that she continues eating prior to doing this. - Continue close monitoring of CBGs, custom low dose SSI  Hyponatremia: From continuous D10 which we have stopped. Will monitor after HD.   ESRD: MWF schedule thru [facility]. - s/p HD for volume overload 1/5, then planned 1/8 though patient declined, rescheduled for 1/9.   Influenza infection:  - Complete 5 days of Tx tamiflu dosed for ESRD.  Patrecia Pour, MD Pager on amion 06/08/2022, 4:07 PM

## 2022-06-08 NOTE — Progress Notes (Signed)
OT Cancellation Note  Patient Details Name: Ann Robertson MRN: [id number] DOB: September 17, 1990   Cancelled Treatment:    Reason Eval/Treat Not Completed: Patient at procedure or test/ unavailable (Pt receiving HD at this time. Will re-attempt OT evaluation when able.)  Ailene Ravel, OTR/L,CBIS  Supplemental OT - [facility] Secure Chat Preferred   06/08/2022, 11:50 AM

## 2022-06-08 NOTE — Progress Notes (Signed)
PT Cancellation Note  Patient Details Name: Ann Robertson MRN: [id number] DOB: 1990-09-02   Cancelled Treatment:    Reason Eval/Treat Not Completed: Patient at procedure or test/unavailable (HD). Will check back at later time.   Leighton Roach, PT  Acute Rehab Services Secure chat preferred Office [place] 06/08/2022, 9:51 AM

## 2022-06-08 NOTE — Progress Notes (Signed)
PT refused to come to [facility], said she would come tomorrow.TX is put off to torrow, on call Dr. Aletha Halim be notified.

## 2022-06-08 NOTE — Progress Notes (Signed)
Subjective: Refused HD yesterday said it was too late.  Seen on hemodialysis this a.m., states feels stronger.  Reminded her dialysis tomorrow to stay on schedule =will do only 3 hours  as volume and K okay  Objective Vital signs in last 24 hours: Vitals:   06/08/22 0900 06/08/22 0930 06/08/22 1000 06/08/22 1030  BP: 137/78 132/79 133/79 135/81  Pulse: 81 84 85 80  Resp: 14 11 13  (!) 9  Temp:      TempSrc:      SpO2: 100% 99% 97% 99%  Weight:      Height:       Weight change:   Physical Exam: General: Thin chronically ill adult female NAD Heart: RRR no MRG Lungs: CTA bilaterally nonlabored breathing Abdomen: NABS, soft NTND Extremities: Trace pedal edema  dialysis Access: Left arm AVG patent on HD       Op  dialysis Orders:  Unit: [place] MWF  Time: 4:00  EDW: 49 kg  Flows: 400/500 Bath: 2K/2Ca  Access: AVG  Heparin: 2500 bolus  ESA: Mircera 120 q 4 wks (last 12/24) VDRA: Hectorol 1 TIW    Problem/Plan: DKA/T1DM= plan per admit team, initially was admitted to ICU on insulin drip, now transferred to floor Acute metabolic encephalopathy = resolved after HD =due to uremia and DKA- . ESRD -HD MWF, refused yesterday, K3.7 volume okay dialysis today and tomorrow to keep on schedule do only 3 hours Influenza infection= on Tamiflu dosed for ESRD per admit team HTN/volume -last HD 3 L UF on 1/05 UF today as tolerated BP stable  Anemia of ESRD-Hgb 9.5 will give Aranesp 100 subcu today 1/09, then continue on HD days as outpatient Secondary hyperparathyroidism -corrected calcium okay phosphorus 5.8 continue Velphoro binder with meals and Hectorol on HD  Ernest Haber, PA-C [facility] [phone] 06/08/2022,10:53 AM  LOS: 4 days   Labs: Basic Metabolic Panel: Recent Labs  Lab 06/04/22 1337 06/04/22 2109 06/06/22 0302 06/07/22 0605 06/08/22 0842  NA 133*   < > 129* 122* 121*  K 3.6   < > 3.4* 3.8 3.7  CL 96*   < > 92* 87* 87*  CO2 18*   < > 27 26 23    GLUCOSE 110*   < > 54* 158* 199*  BUN 44*   < > 23* 30* 41*  CREATININE 5.70*   < > 4.43* 4.82* 5.57*  CALCIUM 7.6*   < > 7.4* 6.9* 7.5*  PHOS 5.8*  --   --   --   --    < > = values in this interval not displayed.   Liver Function Tests: Recent Labs  Lab 06/06/22 0302 06/07/22 0605 06/08/22 0842  AST 71* 49* 42*  ALT 32 26 25  ALKPHOS 365* 318* 322*  BILITOT 1.4* 1.4* 1.3*  PROT 5.4* 5.1* 5.3*  ALBUMIN 2.0* 1.9* 2.0*   No results for input(s): "LIPASE", "AMYLASE" in the last 168 hours. Recent Labs  Lab 06/03/22 2209  AMMONIA 62*   CBC: Recent Labs  Lab 06/03/22 2209 06/03/22 2218 06/04/22 1337 06/05/22 0808 06/06/22 0302 06/07/22 0605 06/08/22 0842  WBC 7.9  --  7.1 5.2 4.7 3.9* 5.2  NEUTROABS 6.3  --   --   --   --   --   --   HGB 12.0   < > 12.4 10.9* 10.2* 9.9* 9.5*  HCT 36.4   < > 35.9* 32.6* 29.1* 30.3* 30.0*  MCV 89.9  --  86.5 86.5  86.1 88.9 91.5  PLT 111*  --  123* 88* 108* 114* 141*   < > = values in this interval not displayed.   Cardiac Enzymes: Recent Labs  Lab 06/03/22 2209  CKTOTAL 278*   CBG: Recent Labs  Lab 06/07/22 1632 06/07/22 1650 06/07/22 2029 06/07/22 2349 06/08/22 0530  GLUCAP 210* 178* 183* 217* 289*    Studies/Results: No results found. Medications:   Chlorhexidine Gluconate Cloth  6 each Topical Q0600   doxercalciferol  1 mcg Intravenous Q M,W,F-HD   gabapentin  100 mg Oral Daily   heparin  5,000 Units Subcutaneous Q8H   hydrocortisone cream   Topical TID   insulin aspart  0-4 Units Subcutaneous Q4H   labetalol  100 mg Oral BID   mouth rinse  15 mL Mouth Rinse 4 times per day   oseltamivir  30 mg Oral Q M,W,F-1800   sucroferric oxyhydroxide  500 mg Oral TID WC

## 2022-06-08 NOTE — Progress Notes (Signed)
Physical Therapy Discharge Patient Details Name: Ann Robertson MRN: [id number] DOB: [birth year] Today's Date: 06/08/2022 Time:  -     Patient discharged from PT services secondary to patient has refused 3 (three) consecutive times without medical reason.  Please see latest therapy progress note for current level of functioning and progress toward goals.    Progress and discharge plan discussed with patient and/or caregiver: Patient/Caregiver agrees with plan.   Please reorder if pt status changes.   Leighton Roach, PT  Acute Rehab Services Secure chat preferred Office [place] 06/08/2022, 4:40 PM

## 2022-06-09 LAB — CBC
HCT: 29.1 % — ABNORMAL LOW (ref 36.0–46.0)
Hemoglobin: 9.6 g/dL — ABNORMAL LOW (ref 12.0–15.0)
MCH: 29.5 pg (ref 26.0–34.0)
MCHC: 33 g/dL (ref 30.0–36.0)
MCV: 89.5 fL (ref 80.0–100.0)
Platelets: 161 10*3/uL (ref 150–400)
RBC: 3.25 MIL/uL — ABNORMAL LOW (ref 3.87–5.11)
RDW: 16.3 % — ABNORMAL HIGH (ref 11.5–15.5)
WBC: 4 10*3/uL (ref 4.0–10.5)
nRBC: 0 % (ref 0.0–0.2)

## 2022-06-09 LAB — COMPREHENSIVE METABOLIC PANEL
ALT: 21 U/L (ref 0–44)
AST: 37 U/L (ref 15–41)
Albumin: 2.1 g/dL — ABNORMAL LOW (ref 3.5–5.0)
Alkaline Phosphatase: 290 U/L — ABNORMAL HIGH (ref 38–126)
Anion gap: 12 (ref 5–15)
BUN: 26 mg/dL — ABNORMAL HIGH (ref 6–20)
CO2: 27 mmol/L (ref 22–32)
Calcium: 7.5 mg/dL — ABNORMAL LOW (ref 8.9–10.3)
Chloride: 91 mmol/L — ABNORMAL LOW (ref 98–111)
Creatinine, Ser: 3.93 mg/dL — ABNORMAL HIGH (ref 0.44–1.00)
GFR, Estimated: 15 mL/min — ABNORMAL LOW (ref >60)
Glucose, Bld: 150 mg/dL — ABNORMAL HIGH (ref 70–99)
Potassium: 3.6 mmol/L (ref 3.5–5.1)
Sodium: 130 mmol/L — ABNORMAL LOW (ref 135–145)
Total Bilirubin: 1.2 mg/dL (ref 0.3–1.2)
Total Protein: 5.4 g/dL — ABNORMAL LOW (ref 6.5–8.1)

## 2022-06-09 LAB — GLUCOSE, CAPILLARY
Glucose-Capillary: 126 mg/dL — ABNORMAL HIGH (ref 70–99)
Glucose-Capillary: 128 mg/dL — ABNORMAL HIGH (ref 70–99)
Glucose-Capillary: 296 mg/dL — ABNORMAL HIGH (ref 70–99)
Glucose-Capillary: 318 mg/dL — ABNORMAL HIGH (ref 70–99)

## 2022-06-09 MED ORDER — DARBEPOETIN ALFA 100 MCG/0.5ML IJ SOSY
100.0000 ug | PREFILLED_SYRINGE | Freq: Once | INTRAMUSCULAR | Status: AC
  Administered 2022-06-09: 100 ug via SUBCUTANEOUS
  Filled 2022-06-09 (×2): qty 0.5

## 2022-06-09 MED ORDER — OSELTAMIVIR PHOSPHATE 30 MG PO CAPS
30.0000 mg | ORAL_CAPSULE | Freq: Once | ORAL | Status: AC
  Administered 2022-06-09: 30 mg via ORAL
  Filled 2022-06-09: qty 1

## 2022-06-09 MED ORDER — LIDOCAINE-PRILOCAINE 2.5-2.5 % EX CREA: 1.0000 | TOPICAL_CREAM | CUTANEOUS | 0 refills | Status: DC | PRN

## 2022-06-09 MED ORDER — OSELTAMIVIR PHOSPHATE 30 MG PO CAPS: 30.0000 mg | ORAL_CAPSULE | ORAL | 0 refills | Status: DC

## 2022-06-09 MED ORDER — INSULIN DETEMIR 100 UNIT/ML ~~LOC~~ SOLN
2.0000 [IU] | Freq: Every day | SUBCUTANEOUS | Status: DC
  Administered 2022-06-09: 2 [IU] via SUBCUTANEOUS
  Filled 2022-06-09: qty 0.02

## 2022-06-09 NOTE — Progress Notes (Signed)
OT Cancellation Note  Patient Details Name: Ann Robertson MRN: [id number] DOB: 22-Jan-1991   Cancelled Treatment:    Reason Eval/Treat Not Completed: OT screened, no needs identified, will sign off Attempted OT evaluation this AM. Pt reports that she is not weak and is concerned that she is not getting the food she wants. States that she is able to get out of bed fine and has been using the bathroom. Pt is not interested in participating in OT evaluation. Will sign off.    Ailene Ravel, OTR/L,CBIS  Supplemental OT - [facility] Secure Chat Preferred   06/09/2022, 9:22 AM

## 2022-06-09 NOTE — Inpatient Diabetes Management (Signed)
Inpatient Diabetes Program Recommendations  AACE/ADA: New Consensus Statement on Inpatient Glycemic Control  Target Ranges:  Prepandial:   less than 140 mg/dL      Peak postprandial:   less than 180 mg/dL (1-2 hours)      Critically ill patients:  140 - 180 mg/dL    Latest Reference Range & Units 06/08/22 05:30 06/08/22 16:09 06/08/22 19:48 06/09/22 00:10 06/09/22 04:18  Glucose-Capillary 70 - 99 mg/dL 289 (H)  Novolog 1 unit 277 (H)  Novolog 2 units 296 (H)  Novolog 1 unit @21 :59 318 (H)  Novolog 3 units 126 (H)   Review of Glycemic Control  Diabetes history: DM1 (does not make any insulin) Outpatient Diabetes medications: Lantus 3 units QHS, Humalog 1-3 units TID  Current orders for Inpatient glycemic control: Novolog 0-4 units Q4H   Inpatient Diabetes Program Recommendations:    Insulin: Please consider ordering Levemir 2 units Q24H.   Thanks, Barnie Alderman, RN, MSN, [place] Diabetes Coordinator Inpatient Diabetes Program [phone] (Team Pager from 8am to [place])

## 2022-06-09 NOTE — Progress Notes (Signed)
Pt for d/c today. Contacted [facility] and clinic can treat pt today if pt arrives at 11:30 for 11:55 chair time. Spoke to pt via phone. Pt agreeable to plan and states father can transport. Update provided to treatment team and appt added to AVS. Contacted inpt HD unit to advise staff of pt's d/c and treatment at out-pt clinic today. Also contacted [facility] and confirmed plan with RN. Renal PA to send orders to clinic for today's treatment.   Melven Sartorius Renal Navigator [phone]

## 2022-06-09 NOTE — Progress Notes (Signed)
Subjective: No complaints, tolerated HD yesterday off schedule after refusing 01/08 HD.  Noted for discharge today, discussed with patient need to go to outpatient kidney center at [facility] today.   Objective Vital signs in last 24 hours: Vitals:   06/08/22 1950 06/09/22 0100 06/09/22 0417 06/09/22 0816  BP: (!) 155/87  136/88 136/84  Pulse: 93  87 87  Resp: 17 20 17    Temp: 98.6 F (37 C)  98.6 F (37 C) 98 F (36.7 C)  TempSrc: Oral   Oral  SpO2: 100% 98% 99% 100%  Weight:      Height:       Weight change:   Physical Exam: General: Alert, NAD Heart: RRR no MRG Lungs: CTA bilaterally nonlabored breathing Abdomen: NABS, soft NTND Extremities: Trace pedal edema  dialysis Access: Left arm AVG + bruit       Op  dialysis Orders:  Unit: [place] MWF  Time: 4:00  EDW: 49 kg  Flows: 400/500 Bath: 2K/2Ca  Access: AVG  Heparin: 2500 bolus  ESA: Mircera 120 q 4 wks (last 12/24) VDRA: Hectorol 1 TIW    Problem/Plan: DKA/T1DM= plan per admit team, initially was admitted to ICU on insulin drip, now transferred to floor Acute metabolic encephalopathy = resolved after HD =due to uremia and DKA- . ESRD -HD MWF, refused yesterday, K3.7 volume okay dialysis today and tomorrow to keep on schedule do only 3 hours Influenza infection= on Tamiflu dosed for ESRD per admit team HTN/volume -3.5 L UF yesterday and last HD 3 L UF on 1/05 UF /BP stable.  No EDW change for now Anemia of ESRD-Hgb 9.6 this a.m. did not receive Aranesp as ordered yesterday we will continue at outpatient kidney center dosing and follow-up Hgb per protocol . Secondary hyperparathyroidism -corrected calcium okay phosphorus 5.8 continue Velphoro binder with meals and Hectorol on HD  Ernest Haber, PA-C [facility] Beeper [phone] 06/09/2022,10:36 AM  LOS: 5 days   Labs: Basic Metabolic Panel: Recent Labs  Lab 06/04/22 1337 06/04/22 2109 06/07/22 0605 06/08/22 0842 06/09/22 0316  NA  133*   < > 122* 121* 130*  K 3.6   < > 3.8 3.7 3.6  CL 96*   < > 87* 87* 91*  CO2 18*   < > 26 23 27   GLUCOSE 110*   < > 158* 199* 150*  BUN 44*   < > 30* 41* 26*  CREATININE 5.70*   < > 4.82* 5.57* 3.93*  CALCIUM 7.6*   < > 6.9* 7.5* 7.5*  PHOS 5.8*  --   --   --   --    < > = values in this interval not displayed.   Liver Function Tests: Recent Labs  Lab 06/07/22 0605 06/08/22 0842 06/09/22 0316  AST 49* 42* 37  ALT 26 25 21   ALKPHOS 318* 322* 290*  BILITOT 1.4* 1.3* 1.2  PROT 5.1* 5.3* 5.4*  ALBUMIN 1.9* 2.0* 2.1*   No results for input(s): "LIPASE", "AMYLASE" in the last 168 hours. Recent Labs  Lab 06/03/22 2209  AMMONIA 62*   CBC: Recent Labs  Lab 06/03/22 2209 06/03/22 2218 06/05/22 0808 06/06/22 0302 06/07/22 0605 06/08/22 0842 06/09/22 0316  WBC 7.9   < > 5.2 4.7 3.9* 5.2 4.0  NEUTROABS 6.3  --   --   --   --   --   --   HGB 12.0   < > 10.9* 10.2* 9.9* 9.5* 9.6*  HCT 36.4   < >  32.6* 29.1* 30.3* 30.0* 29.1*  MCV 89.9   < > 86.5 86.1 88.9 91.5 89.5  PLT 111*   < > 88* 108* 114* 141* 161   < > = values in this interval not displayed.   Cardiac Enzymes: Recent Labs  Lab 06/03/22 2209  CKTOTAL 278*   CBG: Recent Labs  Lab 06/08/22 1609 06/08/22 1948 06/09/22 0010 06/09/22 0418 06/09/22 0817  GLUCAP 277* 296* 318* 126* 128*    Studies/Results: No results found. Medications:   Chlorhexidine Gluconate Cloth  6 each Topical Q0600   darbepoetin (ARANESP) injection - DIALYSIS  100 mcg Subcutaneous Q Tue-1800   gabapentin  100 mg Oral Daily   heparin  5,000 Units Subcutaneous Q8H   hydrocortisone cream   Topical TID   insulin aspart  0-4 Units Subcutaneous Q4H   insulin detemir  2 Units Subcutaneous Daily   labetalol  100 mg Oral BID   mouth rinse  15 mL Mouth Rinse 4 times per day   oseltamivir  30 mg Oral Q M,W,F-1800   oseltamivir  30 mg Oral Once   sucroferric oxyhydroxide  500 mg Oral TID WC

## 2022-06-09 NOTE — Progress Notes (Signed)
Mobility Specialist Progress Note   06/09/22 1023  Mobility  Activity Refused mobility   Pt deferring mobility this AM, claiming they want to eat and put on clothes first. MS signing off after 3rd attempt.   Holland Falling Mobility Specialist Please contact via SecureChat or  Rehab office at [phone]

## 2022-06-09 NOTE — Discharge Summary (Signed)
Physician Discharge Summary  [name] XBM:[id number] DOB: May 15, 1991 DOA: 06/03/2022  PCP: [facility]  Admit date: 06/03/2022 Discharge date: 06/09/2022  Admitted From: home Discharge disposition: home   Recommendations for Outpatient Follow-Up:   Routine HD 1/10 outpatient-- refusing to go to inpatient   Discharge Diagnosis:   Principal Problem:   Acute metabolic encephalopathy Active Problems:   DKA (diabetic ketoacidosis) ([place])   Malnutrition of moderate degree    Discharge Condition: Improved.  Diet recommendation: renal diet  Wound care: None.  Code status: Full.   History of Present Illness:   Ann Robertson is a 32 y.o. female with medical history significant of  HLD, DM type1, ESRD MWF, who presents to ED after she was found to be obtunded by family.  Per family she has missed several HD sessions . Patient currently will awake but somnolent, note able to give detailed history at this time.    Hospital Course by Problem:   Acute metabolic encephalopathy: Due to uremia and DKA, has resolved. -says she is eating and walking -refusing HD here in hospital so will d/c to outpatient HD   T1DM, DKA: DKA resolved, then had persistent hypoglycemia requiring dextrose infusion - resume home meds   Hyponatremia:  -outpatient follow up   ESRD: MWF schedule thru [facility]. - s/p HD for volume overload 1/5, then planned 1/8 though patient declined, rescheduled for 1/9.  -outpatient 1/10   Influenza infection:  - Complete 5 days of Tx tamiflu dosed for ESRD.    Medical Consultants:   renal   Discharge Exam:   Vitals:   06/09/22 0417 06/09/22 0816  BP: 136/88 136/84  Pulse: 87 87  Resp: 17   Temp: 98.6 F (37 C) 98 F (36.7 C)  SpO2: 99% 100%   Vitals:   06/08/22 1950 06/09/22 0100 06/09/22 0417 06/09/22 0816  BP: (!) 155/87  136/88 136/84  Pulse: 93  87 87  Resp: 17 20 17    Temp: 98.6 F (37 C)  98.6 F (37 C) 98 F  (36.7 C)  TempSrc: Oral   Oral  SpO2: 100% 98% 99% 100%  Weight:      Height:        General exam: Appears calm and comfortable. .    The results of significant diagnostics from this hospitalization (including imaging, microbiology, ancillary and laboratory) are listed below for reference.     Procedures and Diagnostic Studies:   CT Head Wo Contrast  Result Date: 06/04/2022 CLINICAL DATA:  Mental status change, unknown cause EXAM: CT HEAD WITHOUT CONTRAST TECHNIQUE: Contiguous axial images were obtained from the base of the skull through the vertex without intravenous contrast. RADIATION DOSE REDUCTION: This exam was performed according to the departmental dose-optimization program which includes automated exposure control, adjustment of the mA and/or kV according to patient size and/or use of iterative reconstruction technique. COMPARISON:  CT head 02/08/2022 FINDINGS: Brain: No evidence of large-territorial acute infarction. No parenchymal hemorrhage. No mass lesion. No extra-axial collection. No mass effect or midline shift. No hydrocephalus. Basilar cisterns are patent. Vascular: No hyperdense vessel. Skull: No acute fracture or focal lesion. Sinuses/Orbits: Paranasal sinuses and mastoid air cells are clear. The orbits are unremarkable. Other: None. IMPRESSION: No acute intracranial abnormality. Electronically Signed   By: Iven Finn M.D.   On: 06/04/2022 01:12   DG Chest Portable 1 View  Result Date: 06/03/2022 CLINICAL DATA:  Altered mental status. EXAM: PORTABLE CHEST 1 VIEW COMPARISON:  02/08/2022. FINDINGS: Heart is enlarged and the mediastinal contour is within normal limits. There is a small right pleural effusion with atelectasis or infiltrate at the right lung base. Visualized portion of the left lung is clear. No pneumothorax is seen. No acute osseous abnormality. IMPRESSION: 1. Small right pleural effusion with atelectasis or infiltrate. 2. Cardiomegaly. Electronically Signed    By: Brett Fairy M.D.   On: 06/03/2022 22:36     Labs:   Basic Metabolic Panel: Recent Labs  Lab 06/04/22 1337 06/04/22 2109 06/05/22 1610 06/06/22 0302 06/07/22 0605 06/08/22 0842 06/09/22 0316  NA 133*   < > 134* 129* 122* 121* 130*  K 3.6   < > 3.6 3.4* 3.8 3.7 3.6  CL 96*   < > 95* 92* 87* 87* 91*  CO2 18*   < > 25 27 26 23 27   GLUCOSE 110*   < > 101* 54* 158* 199* 150*  BUN 44*   < > 21* 23* 30* 41* 26*  CREATININE 5.70*   < > 4.05* 4.43* 4.82* 5.57* 3.93*  CALCIUM 7.6*   < > 7.4* 7.4* 6.9* 7.5* 7.5*  PHOS 5.8*  --   --   --   --   --   --    < > = values in this interval not displayed.   GFR Estimated Creatinine Clearance: 16.9 mL/min (A) (by C-G formula based on SCr of 3.93 mg/dL (H)). Liver Function Tests: Recent Labs  Lab 06/05/22 0808 06/06/22 0302 06/07/22 0605 06/08/22 0842 06/09/22 0316  AST 91* 71* 49* 42* 37  ALT 38 32 26 25 21   ALKPHOS 358* 365* 318* 322* 290*  BILITOT 1.1 1.4* 1.4* 1.3* 1.2  PROT 5.2* 5.4* 5.1* 5.3* 5.4*  ALBUMIN 2.0* 2.0* 1.9* 2.0* 2.1*   No results for input(s): "LIPASE", "AMYLASE" in the last 168 hours. Recent Labs  Lab 06/03/22 2209  AMMONIA 62*   Coagulation profile No results for input(s): "INR", "PROTIME" in the last 168 hours.  CBC: Recent Labs  Lab 06/03/22 2209 06/03/22 2218 06/05/22 0808 06/06/22 0302 06/07/22 0605 06/08/22 0842 06/09/22 0316  WBC 7.9   < > 5.2 4.7 3.9* 5.2 4.0  NEUTROABS 6.3  --   --   --   --   --   --   HGB 12.0   < > 10.9* 10.2* 9.9* 9.5* 9.6*  HCT 36.4   < > 32.6* 29.1* 30.3* 30.0* 29.1*  MCV 89.9   < > 86.5 86.1 88.9 91.5 89.5  PLT 111*   < > 88* 108* 114* 141* 161   < > = values in this interval not displayed.   Cardiac Enzymes: Recent Labs  Lab 06/03/22 2209  CKTOTAL 278*   BNP: Invalid input(s): "POCBNP" CBG: Recent Labs  Lab 06/08/22 1609 06/08/22 1948 06/09/22 0010 06/09/22 0418 06/09/22 0817  GLUCAP 277* 296* 318* 126* 128*   D-Dimer No results for  input(s): "DDIMER" in the last 72 hours. Hgb A1c No results for input(s): "HGBA1C" in the last 72 hours. Lipid Profile No results for input(s): "CHOL", "HDL", "LDLCALC", "TRIG", "CHOLHDL", "LDLDIRECT" in the last 72 hours. Thyroid function studies No results for input(s): "TSH", "T4TOTAL", "T3FREE", "THYROIDAB" in the last 72 hours.  Invalid input(s): "FREET3" Anemia work up No results for input(s): "VITAMINB12", "FOLATE", "FERRITIN", "TIBC", "IRON", "RETICCTPCT" in the last 72 hours. Microbiology Recent Results (from the past 240 hour(s))  Resp panel by RT-PCR (RSV, Flu A&B, Covid) Anterior Nasal Swab  Status: Abnormal   Collection Time: 06/03/22 11:17 PM   Specimen: Anterior Nasal Swab  Result Value Ref Range Status   SARS Coronavirus 2 by RT PCR NEGATIVE NEGATIVE Final    Comment: (NOTE) SARS-CoV-2 target nucleic acids are NOT DETECTED.  The SARS-CoV-2 RNA is generally detectable in upper respiratory specimens during the acute phase of infection. The lowest concentration of SARS-CoV-2 viral copies this assay can detect is 138 copies/mL. A negative result does not preclude SARS-Cov-2 infection and should not be used as the sole basis for treatment or other patient management decisions. A negative result may occur with  improper specimen collection/handling, submission of specimen other than nasopharyngeal swab, presence of viral mutation(s) within the areas targeted by this assay, and inadequate number of viral copies(<138 copies/mL). A negative result must be combined with clinical observations, patient history, and epidemiological information. The expected result is Negative.  Fact Sheet for Patients:  EntrepreneurPulse.com.au  Fact Sheet for Healthcare Providers:  IncredibleEmployment.be  This test is no t yet approved or cleared by the [place] FDA and  has been authorized for detection and/or diagnosis of SARS-CoV-2 by FDA  under an Emergency Use Authorization (EUA). This EUA will remain  in effect (meaning this test can be used) for the duration of the COVID-19 declaration under Section 564(b)(1) of the Act, 21 U.S.C.section 360bbb-3(b)(1), unless the authorization is terminated  or revoked sooner.       Influenza A by PCR NEGATIVE NEGATIVE Final   Influenza B by PCR POSITIVE (A) NEGATIVE Final    Comment: (NOTE) The Xpert Xpress SARS-CoV-2/FLU/RSV plus assay is intended as an aid in the diagnosis of influenza from Nasopharyngeal swab specimens and should not be used as a sole basis for treatment. Nasal washings and aspirates are unacceptable for Xpert Xpress SARS-CoV-2/FLU/RSV testing.  Fact Sheet for Patients: EntrepreneurPulse.com.au  Fact Sheet for Healthcare Providers: IncredibleEmployment.be  This test is not yet approved or cleared by the [place] FDA and has been authorized for detection and/or diagnosis of SARS-CoV-2 by FDA under an Emergency Use Authorization (EUA). This EUA will remain in effect (meaning this test can be used) for the duration of the COVID-19 declaration under Section 564(b)(1) of the Act, 21 U.S.C. section 360bbb-3(b)(1), unless the authorization is terminated or revoked.     Resp Syncytial Virus by PCR NEGATIVE NEGATIVE Final    Comment: (NOTE) Fact Sheet for Patients: EntrepreneurPulse.com.au  Fact Sheet for Healthcare Providers: IncredibleEmployment.be  This test is not yet approved or cleared by the [place] FDA and has been authorized for detection and/or diagnosis of SARS-CoV-2 by FDA under an Emergency Use Authorization (EUA). This EUA will remain in effect (meaning this test can be used) for the duration of the COVID-19 declaration under Section 564(b)(1) of the Act, 21 U.S.C. section 360bbb-3(b)(1), unless the authorization is terminated or revoked.  Performed at [facility], [place] [address], [place], [place] [address]   MRSA Next Gen by PCR, Nasal     Status: None   Collection Time: 06/04/22  3:06 PM   Specimen: Nasal Mucosa; Nasal Swab  Result Value Ref Range Status   MRSA by PCR Next Gen NOT DETECTED NOT DETECTED Final    Comment: (NOTE) The GeneXpert MRSA Assay (FDA approved for NASAL specimens only), is one component of a comprehensive MRSA colonization surveillance program. It is not intended to diagnose MRSA infection nor to guide or monitor treatment for MRSA infections. Test performance is not FDA approved in  patients less than 11 years old. Performed at [facility], [place] [address] [place], [place] [address]      Discharge Instructions:   Discharge Instructions     Discharge instructions   Complete by: As directed    Renal diet   Increase activity slowly   Complete by: As directed    No wound care   Complete by: As directed       Allergies as of 06/09/2022       Reactions   Hydromorphone Hcl Itching, Rash        Medication List     TAKE these medications    Baqsimi One Pack 3 MG/DOSE Powd Generic drug: Glucagon Place 1 each into the nose as needed.   gabapentin 100 MG capsule Commonly known as: NEURONTIN Take 100 mg by mouth daily.   Glucose 15 GM/32ML Gel Please use gel for low blood sugars per instructions with packaging   hydrOXYzine 25 MG tablet Commonly known as: ATARAX Take 1 tablet (25 mg total) by mouth 2 (two) times daily as needed for itching.   insulin lispro 100 UNIT/ML KwikPen Commonly known as: HUMALOG Inject 1-3 Units into the skin See admin instructions. Inject 1-3 units up to three times daily with meals (only if eating) per sliding scale based on blood glucose and Carb count.   labetalol 100 MG tablet Commonly known as: NORMODYNE Take 100 mg by mouth 2 (two) times daily.   Lantus SoloStar 100 UNIT/ML Solostar Pen Generic drug: insulin glargine Inject 3 Units into the  skin at bedtime. What changed:  how much to take when to take this   lidocaine-prilocaine cream Commonly known as: EMLA Apply 1 Application topically as needed (For port access).   loperamide 2 MG capsule Commonly known as: IMODIUM Please take one (2mg ) tablet up to four times a day for loose stools, until you have 1-2 bowel movements. Please continue this dose. If you stop having bowel movements, please stop using this medication.   methocarbamol 750 MG tablet Commonly known as: ROBAXIN Take 750 mg by mouth every 8 (eight) hours as needed for muscle spasms.   omeprazole 20 MG capsule Commonly known as: PRILOSEC Take 20 mg by mouth daily as needed (acid reflux/heartburn).   oseltamivir 30 MG capsule Commonly known as: TAMIFLU Take 1 capsule (30 mg total) by mouth every Monday, Wednesday, and Friday at 6 PM.   oxyCODONE-acetaminophen 10-325 MG tablet Commonly known as: PERCOCET Take 1 tablet by mouth every 4 (four) hours as needed for pain.   phenylephrine-shark liver oil-mineral oil-petrolatum 0.25-14-74.9 % rectal ointment Commonly known as: PREPARATION H Place 1 Application rectally 2 (two) times daily as needed for hemorrhoids.   rosuvastatin 20 MG tablet Commonly known as: CRESTOR Take 20 mg by mouth at bedtime.          Time coordinating discharge: 45 min  Signed:  Geradine Girt DO  [facility] 06/09/2022, 9:36 AM

## 2022-06-14 ENCOUNTER — Encounter (HOSPITAL_COMMUNITY): Payer: Self-pay | Admitting: Emergency Medicine

## 2022-06-14 ENCOUNTER — Emergency Department (HOSPITAL_COMMUNITY)
Admission: EM | Admit: 2022-06-14 | Discharge: 2022-06-14 | Discharge status: Against Medical Advice | Payer: Medicare Other | Attending: Emergency Medicine | Admitting: Emergency Medicine

## 2022-06-14 ENCOUNTER — Other Ambulatory Visit: Payer: Self-pay

## 2022-06-14 DIAGNOSIS — R739 Hyperglycemia, unspecified: Secondary | ICD-10-CM | POA: Insufficient documentation

## 2022-06-14 DIAGNOSIS — R531 Weakness: Secondary | ICD-10-CM | POA: Diagnosis not present

## 2022-06-14 DIAGNOSIS — Z5321 Procedure and treatment not carried out due to patient leaving prior to being seen by health care provider: Secondary | ICD-10-CM | POA: Diagnosis not present

## 2022-06-14 LAB — CBG MONITORING, ED
Glucose-Capillary: 484 mg/dL — ABNORMAL HIGH (ref 70–99)
Glucose-Capillary: 339 mg/dL — ABNORMAL HIGH (ref 70–99)

## 2022-06-14 LAB — I-STAT VENOUS BLOOD GAS, ED
Acid-base deficit: 2 mmol/L (ref 0.0–2.0)
Bicarbonate: 24.2 mmol/L (ref 20.0–28.0)
Calcium, Ion: 1.1 mmol/L — ABNORMAL LOW (ref 1.15–1.40)
HCT: 40 % (ref 36.0–46.0)
Hemoglobin: 13.6 g/dL (ref 12.0–15.0)
O2 Saturation: 50 %
Potassium: 3.6 mmol/L (ref 3.5–5.1)
Sodium: 126 mmol/L — ABNORMAL LOW (ref 135–145)
TCO2: 26 mmol/L (ref 22–32)
pCO2, Ven: 45.6 mmHg (ref 44–60)
pH, Ven: 7.332 (ref 7.25–7.43)
pO2, Ven: 29 mmHg — CL (ref 32–45)

## 2022-06-14 LAB — BASIC METABOLIC PANEL
Anion gap: 14 (ref 5–15)
BUN: 41 mg/dL — ABNORMAL HIGH (ref 6–20)
CO2: 23 mmol/L (ref 22–32)
Calcium: 8.7 mg/dL — ABNORMAL LOW (ref 8.9–10.3)
Chloride: 87 mmol/L — ABNORMAL LOW (ref 98–111)
Creatinine, Ser: 5.61 mg/dL — ABNORMAL HIGH (ref 0.44–1.00)
GFR, Estimated: 10 mL/min — ABNORMAL LOW (ref >60)
Glucose, Bld: 512 mg/dL (ref 70–99)
Potassium: 3.7 mmol/L (ref 3.5–5.1)
Sodium: 124 mmol/L — ABNORMAL LOW (ref 135–145)

## 2022-06-14 LAB — CBC
HCT: 33.3 % — ABNORMAL LOW (ref 36.0–46.0)
Hemoglobin: 10.5 g/dL — ABNORMAL LOW (ref 12.0–15.0)
MCH: 29.2 pg (ref 26.0–34.0)
MCHC: 31.5 g/dL (ref 30.0–36.0)
MCV: 92.8 fL (ref 80.0–100.0)
Platelets: 187 10*3/uL (ref 150–400)
RBC: 3.59 MIL/uL — ABNORMAL LOW (ref 3.87–5.11)
RDW: 15.3 % (ref 11.5–15.5)
WBC: 5.8 10*3/uL (ref 4.0–10.5)
nRBC: 0 % (ref 0.0–0.2)

## 2022-06-14 LAB — I-STAT BETA HCG BLOOD, ED (MC, WL, AP ONLY): I-stat hCG, quantitative: <5 m[IU]/mL (ref <5)

## 2022-06-14 NOTE — ED Notes (Signed)
Pt called multiple times no answer 

## 2022-06-14 NOTE — ED Provider Triage Note (Signed)
Emergency Medicine Provider Triage Evaluation Note  Angel Hobdy , a 32 y.o. female  was evaluated in triage.  Pt complains of Hyperglycemia, missed dialysis, patient with a history of same, concerned about developing DKA.  She reports generalized weakness, she denies nausea, vomiting, abdominal pain.  She is not sure when the last time she gave her self insulin was but she thinks it was today..  Review of Systems  Positive: Weakness Negative: Chest pain, shortness of breath, nausea, vomiting  Physical Exam  There were no vitals taken for this visit. Gen:   Awake, ill appearing Resp:  Normal effort  MSK:   Moves extremities without difficulty  Other:  No TTP of abdomen, no noted kussmaul respiration  Medical Decision Making  Medically screening exam initiated at 7:56 PM.  Appropriate orders placed.  Jesenia Spera was informed that the remainder of the evaluation will be completed by another provider, this initial triage assessment does not replace that evaluation, and the importance of remaining in the ED until their evaluation is complete.  Workup initiated in triage    Anselmo Pickler, [place] 06/14/22 1959

## 2022-06-14 NOTE — ED Triage Notes (Signed)
Pt arrives [facility] from home for hyperglycemia. CBG >600. Dialysis MWF- missed session this morning. Intense spasms reported by ems. Generalized weakness starting today. Unsure of last insulin dose but states she thinks she took it today.

## 2022-08-30 DIAGNOSIS — Z992 Dependence on renal dialysis: Secondary | ICD-10-CM | POA: Diagnosis not present

## 2022-08-30 DIAGNOSIS — L299 Pruritus, unspecified: Secondary | ICD-10-CM | POA: Diagnosis not present

## 2022-08-30 DIAGNOSIS — L298 Other pruritus: Secondary | ICD-10-CM | POA: Diagnosis not present

## 2022-08-30 DIAGNOSIS — N186 End stage renal disease: Secondary | ICD-10-CM | POA: Diagnosis not present

## 2022-09-01 DIAGNOSIS — L298 Other pruritus: Secondary | ICD-10-CM | POA: Diagnosis not present

## 2022-09-01 DIAGNOSIS — L299 Pruritus, unspecified: Secondary | ICD-10-CM | POA: Diagnosis not present

## 2022-09-01 DIAGNOSIS — N186 End stage renal disease: Secondary | ICD-10-CM | POA: Diagnosis not present

## 2022-09-01 DIAGNOSIS — Z992 Dependence on renal dialysis: Secondary | ICD-10-CM | POA: Diagnosis not present

## 2022-09-03 DIAGNOSIS — L299 Pruritus, unspecified: Secondary | ICD-10-CM | POA: Diagnosis not present

## 2022-09-03 DIAGNOSIS — L298 Other pruritus: Secondary | ICD-10-CM | POA: Diagnosis not present

## 2022-09-03 DIAGNOSIS — N186 End stage renal disease: Secondary | ICD-10-CM | POA: Diagnosis not present

## 2022-09-03 DIAGNOSIS — Z992 Dependence on renal dialysis: Secondary | ICD-10-CM | POA: Diagnosis not present

## 2022-09-06 DIAGNOSIS — L299 Pruritus, unspecified: Secondary | ICD-10-CM | POA: Diagnosis not present

## 2022-09-06 DIAGNOSIS — L298 Other pruritus: Secondary | ICD-10-CM | POA: Diagnosis not present

## 2022-09-06 DIAGNOSIS — N186 End stage renal disease: Secondary | ICD-10-CM | POA: Diagnosis not present

## 2022-09-06 DIAGNOSIS — Z992 Dependence on renal dialysis: Secondary | ICD-10-CM | POA: Diagnosis not present

## 2022-09-10 DIAGNOSIS — Z992 Dependence on renal dialysis: Secondary | ICD-10-CM | POA: Diagnosis not present

## 2022-09-10 DIAGNOSIS — L299 Pruritus, unspecified: Secondary | ICD-10-CM | POA: Diagnosis not present

## 2022-09-10 DIAGNOSIS — N186 End stage renal disease: Secondary | ICD-10-CM | POA: Diagnosis not present

## 2022-09-10 DIAGNOSIS — L298 Other pruritus: Secondary | ICD-10-CM | POA: Diagnosis not present

## 2022-09-13 DIAGNOSIS — Z992 Dependence on renal dialysis: Secondary | ICD-10-CM | POA: Diagnosis not present

## 2022-09-13 DIAGNOSIS — L299 Pruritus, unspecified: Secondary | ICD-10-CM | POA: Diagnosis not present

## 2022-09-13 DIAGNOSIS — N186 End stage renal disease: Secondary | ICD-10-CM | POA: Diagnosis not present

## 2022-09-13 DIAGNOSIS — L298 Other pruritus: Secondary | ICD-10-CM | POA: Diagnosis not present

## 2022-09-14 DIAGNOSIS — Z9181 History of falling: Secondary | ICD-10-CM | POA: Diagnosis not present

## 2022-09-14 DIAGNOSIS — E1065 Type 1 diabetes mellitus with hyperglycemia: Secondary | ICD-10-CM | POA: Diagnosis not present

## 2022-09-14 DIAGNOSIS — E1140 Type 2 diabetes mellitus with diabetic neuropathy, unspecified: Secondary | ICD-10-CM | POA: Diagnosis not present

## 2022-09-14 DIAGNOSIS — Z79899 Other long term (current) drug therapy: Secondary | ICD-10-CM | POA: Diagnosis not present

## 2022-09-14 DIAGNOSIS — Z681 Body mass index (BMI) 19 or less, adult: Secondary | ICD-10-CM | POA: Diagnosis not present

## 2022-09-14 DIAGNOSIS — G8929 Other chronic pain: Secondary | ICD-10-CM | POA: Diagnosis not present

## 2022-09-14 DIAGNOSIS — R1013 Epigastric pain: Secondary | ICD-10-CM | POA: Diagnosis not present

## 2022-09-14 DIAGNOSIS — M5416 Radiculopathy, lumbar region: Secondary | ICD-10-CM | POA: Diagnosis not present

## 2022-09-14 DIAGNOSIS — I10 Essential (primary) hypertension: Secondary | ICD-10-CM | POA: Diagnosis not present

## 2022-09-15 DIAGNOSIS — L298 Other pruritus: Secondary | ICD-10-CM | POA: Diagnosis not present

## 2022-09-15 DIAGNOSIS — Z992 Dependence on renal dialysis: Secondary | ICD-10-CM | POA: Diagnosis not present

## 2022-09-15 DIAGNOSIS — N186 End stage renal disease: Secondary | ICD-10-CM | POA: Diagnosis not present

## 2022-09-15 DIAGNOSIS — L299 Pruritus, unspecified: Secondary | ICD-10-CM | POA: Diagnosis not present

## 2022-09-16 DIAGNOSIS — Z79899 Other long term (current) drug therapy: Secondary | ICD-10-CM | POA: Diagnosis not present

## 2022-09-17 DIAGNOSIS — L298 Other pruritus: Secondary | ICD-10-CM | POA: Diagnosis not present

## 2022-09-17 DIAGNOSIS — L299 Pruritus, unspecified: Secondary | ICD-10-CM | POA: Diagnosis not present

## 2022-09-17 DIAGNOSIS — N186 End stage renal disease: Secondary | ICD-10-CM | POA: Diagnosis not present

## 2022-09-17 DIAGNOSIS — Z992 Dependence on renal dialysis: Secondary | ICD-10-CM | POA: Diagnosis not present

## 2022-09-20 DIAGNOSIS — N186 End stage renal disease: Secondary | ICD-10-CM | POA: Diagnosis not present

## 2022-09-20 DIAGNOSIS — L298 Other pruritus: Secondary | ICD-10-CM | POA: Diagnosis not present

## 2022-09-20 DIAGNOSIS — Z992 Dependence on renal dialysis: Secondary | ICD-10-CM | POA: Diagnosis not present

## 2022-09-20 DIAGNOSIS — L299 Pruritus, unspecified: Secondary | ICD-10-CM | POA: Diagnosis not present

## 2022-09-22 DIAGNOSIS — L299 Pruritus, unspecified: Secondary | ICD-10-CM | POA: Diagnosis not present

## 2022-09-22 DIAGNOSIS — Z992 Dependence on renal dialysis: Secondary | ICD-10-CM | POA: Diagnosis not present

## 2022-09-22 DIAGNOSIS — L298 Other pruritus: Secondary | ICD-10-CM | POA: Diagnosis not present

## 2022-09-22 DIAGNOSIS — N186 End stage renal disease: Secondary | ICD-10-CM | POA: Diagnosis not present

## 2022-09-24 DIAGNOSIS — Z992 Dependence on renal dialysis: Secondary | ICD-10-CM | POA: Diagnosis not present

## 2022-09-24 DIAGNOSIS — N186 End stage renal disease: Secondary | ICD-10-CM | POA: Diagnosis not present

## 2022-09-24 DIAGNOSIS — L298 Other pruritus: Secondary | ICD-10-CM | POA: Diagnosis not present

## 2022-09-24 DIAGNOSIS — L299 Pruritus, unspecified: Secondary | ICD-10-CM | POA: Diagnosis not present

## 2022-09-27 DIAGNOSIS — L298 Other pruritus: Secondary | ICD-10-CM | POA: Diagnosis not present

## 2022-09-27 DIAGNOSIS — L299 Pruritus, unspecified: Secondary | ICD-10-CM | POA: Diagnosis not present

## 2022-09-27 DIAGNOSIS — Z992 Dependence on renal dialysis: Secondary | ICD-10-CM | POA: Diagnosis not present

## 2022-09-27 DIAGNOSIS — N186 End stage renal disease: Secondary | ICD-10-CM | POA: Diagnosis not present

## 2022-09-28 DIAGNOSIS — I129 Hypertensive chronic kidney disease with stage 1 through stage 4 chronic kidney disease, or unspecified chronic kidney disease: Secondary | ICD-10-CM | POA: Diagnosis not present

## 2022-09-28 DIAGNOSIS — Z992 Dependence on renal dialysis: Secondary | ICD-10-CM | POA: Diagnosis not present

## 2022-09-28 DIAGNOSIS — N186 End stage renal disease: Secondary | ICD-10-CM | POA: Diagnosis not present

## 2022-09-29 DIAGNOSIS — Z992 Dependence on renal dialysis: Secondary | ICD-10-CM | POA: Diagnosis not present

## 2022-09-29 DIAGNOSIS — N186 End stage renal disease: Secondary | ICD-10-CM | POA: Diagnosis not present

## 2022-09-29 DIAGNOSIS — L299 Pruritus, unspecified: Secondary | ICD-10-CM | POA: Diagnosis not present

## 2022-10-04 DIAGNOSIS — L299 Pruritus, unspecified: Secondary | ICD-10-CM | POA: Diagnosis not present

## 2022-10-04 DIAGNOSIS — Z992 Dependence on renal dialysis: Secondary | ICD-10-CM | POA: Diagnosis not present

## 2022-10-04 DIAGNOSIS — N186 End stage renal disease: Secondary | ICD-10-CM | POA: Diagnosis not present

## 2022-10-06 DIAGNOSIS — N186 End stage renal disease: Secondary | ICD-10-CM | POA: Diagnosis not present

## 2022-10-06 DIAGNOSIS — L299 Pruritus, unspecified: Secondary | ICD-10-CM | POA: Diagnosis not present

## 2022-10-06 DIAGNOSIS — Z992 Dependence on renal dialysis: Secondary | ICD-10-CM | POA: Diagnosis not present

## 2022-10-08 DIAGNOSIS — N186 End stage renal disease: Secondary | ICD-10-CM | POA: Diagnosis not present

## 2022-10-08 DIAGNOSIS — Z992 Dependence on renal dialysis: Secondary | ICD-10-CM | POA: Diagnosis not present

## 2022-10-08 DIAGNOSIS — L299 Pruritus, unspecified: Secondary | ICD-10-CM | POA: Diagnosis not present

## 2022-10-11 DIAGNOSIS — N186 End stage renal disease: Secondary | ICD-10-CM | POA: Diagnosis not present

## 2022-10-11 DIAGNOSIS — Z992 Dependence on renal dialysis: Secondary | ICD-10-CM | POA: Diagnosis not present

## 2022-10-11 DIAGNOSIS — L299 Pruritus, unspecified: Secondary | ICD-10-CM | POA: Diagnosis not present

## 2022-10-13 DIAGNOSIS — N186 End stage renal disease: Secondary | ICD-10-CM | POA: Diagnosis not present

## 2022-10-13 DIAGNOSIS — L299 Pruritus, unspecified: Secondary | ICD-10-CM | POA: Diagnosis not present

## 2022-10-13 DIAGNOSIS — Z992 Dependence on renal dialysis: Secondary | ICD-10-CM | POA: Diagnosis not present

## 2022-10-14 DIAGNOSIS — M5416 Radiculopathy, lumbar region: Secondary | ICD-10-CM | POA: Diagnosis not present

## 2022-10-14 DIAGNOSIS — I10 Essential (primary) hypertension: Secondary | ICD-10-CM | POA: Diagnosis not present

## 2022-10-14 DIAGNOSIS — G8929 Other chronic pain: Secondary | ICD-10-CM | POA: Diagnosis not present

## 2022-10-14 DIAGNOSIS — E1140 Type 2 diabetes mellitus with diabetic neuropathy, unspecified: Secondary | ICD-10-CM | POA: Diagnosis not present

## 2022-10-14 DIAGNOSIS — Z79899 Other long term (current) drug therapy: Secondary | ICD-10-CM | POA: Diagnosis not present

## 2022-10-14 DIAGNOSIS — Z681 Body mass index (BMI) 19 or less, adult: Secondary | ICD-10-CM | POA: Diagnosis not present

## 2022-10-14 DIAGNOSIS — E1065 Type 1 diabetes mellitus with hyperglycemia: Secondary | ICD-10-CM | POA: Diagnosis not present

## 2022-10-14 DIAGNOSIS — R1013 Epigastric pain: Secondary | ICD-10-CM | POA: Diagnosis not present

## 2022-10-14 DIAGNOSIS — Z9181 History of falling: Secondary | ICD-10-CM | POA: Diagnosis not present

## 2022-10-15 DIAGNOSIS — L299 Pruritus, unspecified: Secondary | ICD-10-CM | POA: Diagnosis not present

## 2022-10-15 DIAGNOSIS — Z992 Dependence on renal dialysis: Secondary | ICD-10-CM | POA: Diagnosis not present

## 2022-10-15 DIAGNOSIS — N186 End stage renal disease: Secondary | ICD-10-CM | POA: Diagnosis not present

## 2022-10-18 DIAGNOSIS — N186 End stage renal disease: Secondary | ICD-10-CM | POA: Diagnosis not present

## 2022-10-18 DIAGNOSIS — Z992 Dependence on renal dialysis: Secondary | ICD-10-CM | POA: Diagnosis not present

## 2022-10-18 DIAGNOSIS — L299 Pruritus, unspecified: Secondary | ICD-10-CM | POA: Diagnosis not present

## 2022-10-20 DIAGNOSIS — Z992 Dependence on renal dialysis: Secondary | ICD-10-CM | POA: Diagnosis not present

## 2022-10-20 DIAGNOSIS — L299 Pruritus, unspecified: Secondary | ICD-10-CM | POA: Diagnosis not present

## 2022-10-20 DIAGNOSIS — N186 End stage renal disease: Secondary | ICD-10-CM | POA: Diagnosis not present

## 2022-10-22 DIAGNOSIS — L299 Pruritus, unspecified: Secondary | ICD-10-CM | POA: Diagnosis not present

## 2022-10-22 DIAGNOSIS — N186 End stage renal disease: Secondary | ICD-10-CM | POA: Diagnosis not present

## 2022-10-22 DIAGNOSIS — Z992 Dependence on renal dialysis: Secondary | ICD-10-CM | POA: Diagnosis not present

## 2022-10-25 DIAGNOSIS — Z992 Dependence on renal dialysis: Secondary | ICD-10-CM | POA: Diagnosis not present

## 2022-10-25 DIAGNOSIS — N186 End stage renal disease: Secondary | ICD-10-CM | POA: Diagnosis not present

## 2022-10-25 DIAGNOSIS — L299 Pruritus, unspecified: Secondary | ICD-10-CM | POA: Diagnosis not present

## 2022-10-27 DIAGNOSIS — Z992 Dependence on renal dialysis: Secondary | ICD-10-CM | POA: Diagnosis not present

## 2022-10-27 DIAGNOSIS — L299 Pruritus, unspecified: Secondary | ICD-10-CM | POA: Diagnosis not present

## 2022-10-27 DIAGNOSIS — N186 End stage renal disease: Secondary | ICD-10-CM | POA: Diagnosis not present

## 2022-10-29 DIAGNOSIS — N186 End stage renal disease: Secondary | ICD-10-CM | POA: Diagnosis not present

## 2022-10-29 DIAGNOSIS — L299 Pruritus, unspecified: Secondary | ICD-10-CM | POA: Diagnosis not present

## 2022-10-29 DIAGNOSIS — Z992 Dependence on renal dialysis: Secondary | ICD-10-CM | POA: Diagnosis not present

## 2022-10-29 DIAGNOSIS — I129 Hypertensive chronic kidney disease with stage 1 through stage 4 chronic kidney disease, or unspecified chronic kidney disease: Secondary | ICD-10-CM | POA: Diagnosis not present

## 2022-11-01 DIAGNOSIS — Z992 Dependence on renal dialysis: Secondary | ICD-10-CM | POA: Diagnosis not present

## 2022-11-01 DIAGNOSIS — N186 End stage renal disease: Secondary | ICD-10-CM | POA: Diagnosis not present

## 2022-11-01 DIAGNOSIS — E8770 Fluid overload, unspecified: Secondary | ICD-10-CM | POA: Diagnosis not present

## 2022-11-05 DIAGNOSIS — E8770 Fluid overload, unspecified: Secondary | ICD-10-CM | POA: Diagnosis not present

## 2022-11-05 DIAGNOSIS — Z992 Dependence on renal dialysis: Secondary | ICD-10-CM | POA: Diagnosis not present

## 2022-11-05 DIAGNOSIS — N186 End stage renal disease: Secondary | ICD-10-CM | POA: Diagnosis not present

## 2022-11-08 DIAGNOSIS — Z992 Dependence on renal dialysis: Secondary | ICD-10-CM | POA: Diagnosis not present

## 2022-11-08 DIAGNOSIS — N186 End stage renal disease: Secondary | ICD-10-CM | POA: Diagnosis not present

## 2022-11-08 DIAGNOSIS — E8770 Fluid overload, unspecified: Secondary | ICD-10-CM | POA: Diagnosis not present

## 2022-11-10 DIAGNOSIS — N186 End stage renal disease: Secondary | ICD-10-CM | POA: Diagnosis not present

## 2022-11-10 DIAGNOSIS — E8770 Fluid overload, unspecified: Secondary | ICD-10-CM | POA: Diagnosis not present

## 2022-11-10 DIAGNOSIS — Z992 Dependence on renal dialysis: Secondary | ICD-10-CM | POA: Diagnosis not present

## 2022-11-12 DIAGNOSIS — Z992 Dependence on renal dialysis: Secondary | ICD-10-CM | POA: Diagnosis not present

## 2022-11-12 DIAGNOSIS — E8770 Fluid overload, unspecified: Secondary | ICD-10-CM | POA: Diagnosis not present

## 2022-11-12 DIAGNOSIS — N186 End stage renal disease: Secondary | ICD-10-CM | POA: Diagnosis not present

## 2022-11-15 DIAGNOSIS — E8770 Fluid overload, unspecified: Secondary | ICD-10-CM | POA: Diagnosis not present

## 2022-11-15 DIAGNOSIS — Z992 Dependence on renal dialysis: Secondary | ICD-10-CM | POA: Diagnosis not present

## 2022-11-15 DIAGNOSIS — N186 End stage renal disease: Secondary | ICD-10-CM | POA: Diagnosis not present

## 2022-11-16 DIAGNOSIS — E1140 Type 2 diabetes mellitus with diabetic neuropathy, unspecified: Secondary | ICD-10-CM | POA: Diagnosis not present

## 2022-11-16 DIAGNOSIS — N184 Chronic kidney disease, stage 4 (severe): Secondary | ICD-10-CM | POA: Diagnosis not present

## 2022-11-16 DIAGNOSIS — Z79899 Other long term (current) drug therapy: Secondary | ICD-10-CM | POA: Diagnosis not present

## 2022-11-16 DIAGNOSIS — R1013 Epigastric pain: Secondary | ICD-10-CM | POA: Diagnosis not present

## 2022-11-16 DIAGNOSIS — Z681 Body mass index (BMI) 19 or less, adult: Secondary | ICD-10-CM | POA: Diagnosis not present

## 2022-11-16 DIAGNOSIS — F17210 Nicotine dependence, cigarettes, uncomplicated: Secondary | ICD-10-CM | POA: Diagnosis not present

## 2022-11-16 DIAGNOSIS — R030 Elevated blood-pressure reading, without diagnosis of hypertension: Secondary | ICD-10-CM | POA: Diagnosis not present

## 2022-11-16 DIAGNOSIS — G8929 Other chronic pain: Secondary | ICD-10-CM | POA: Diagnosis not present

## 2022-11-16 DIAGNOSIS — M5416 Radiculopathy, lumbar region: Secondary | ICD-10-CM | POA: Diagnosis not present

## 2022-11-17 DIAGNOSIS — E8770 Fluid overload, unspecified: Secondary | ICD-10-CM | POA: Diagnosis not present

## 2022-11-17 DIAGNOSIS — Z992 Dependence on renal dialysis: Secondary | ICD-10-CM | POA: Diagnosis not present

## 2022-11-17 DIAGNOSIS — N186 End stage renal disease: Secondary | ICD-10-CM | POA: Diagnosis not present

## 2022-11-19 DIAGNOSIS — E8770 Fluid overload, unspecified: Secondary | ICD-10-CM | POA: Diagnosis not present

## 2022-11-19 DIAGNOSIS — N186 End stage renal disease: Secondary | ICD-10-CM | POA: Diagnosis not present

## 2022-11-19 DIAGNOSIS — Z992 Dependence on renal dialysis: Secondary | ICD-10-CM | POA: Diagnosis not present

## 2022-11-22 DIAGNOSIS — N186 End stage renal disease: Secondary | ICD-10-CM | POA: Diagnosis not present

## 2022-11-22 DIAGNOSIS — E8770 Fluid overload, unspecified: Secondary | ICD-10-CM | POA: Diagnosis not present

## 2022-11-22 DIAGNOSIS — Z992 Dependence on renal dialysis: Secondary | ICD-10-CM | POA: Diagnosis not present

## 2022-11-24 DIAGNOSIS — E8770 Fluid overload, unspecified: Secondary | ICD-10-CM | POA: Diagnosis not present

## 2022-11-24 DIAGNOSIS — Z992 Dependence on renal dialysis: Secondary | ICD-10-CM | POA: Diagnosis not present

## 2022-11-24 DIAGNOSIS — N186 End stage renal disease: Secondary | ICD-10-CM | POA: Diagnosis not present

## 2022-11-25 DIAGNOSIS — E8770 Fluid overload, unspecified: Secondary | ICD-10-CM | POA: Diagnosis not present

## 2022-11-25 DIAGNOSIS — Z992 Dependence on renal dialysis: Secondary | ICD-10-CM | POA: Diagnosis not present

## 2022-11-25 DIAGNOSIS — N186 End stage renal disease: Secondary | ICD-10-CM | POA: Diagnosis not present

## 2022-11-26 DIAGNOSIS — Z992 Dependence on renal dialysis: Secondary | ICD-10-CM | POA: Diagnosis not present

## 2022-11-26 DIAGNOSIS — N186 End stage renal disease: Secondary | ICD-10-CM | POA: Diagnosis not present

## 2022-11-26 DIAGNOSIS — E8770 Fluid overload, unspecified: Secondary | ICD-10-CM | POA: Diagnosis not present

## 2022-11-28 DIAGNOSIS — N186 End stage renal disease: Secondary | ICD-10-CM | POA: Diagnosis not present

## 2022-11-28 DIAGNOSIS — Z992 Dependence on renal dialysis: Secondary | ICD-10-CM | POA: Diagnosis not present

## 2022-11-28 DIAGNOSIS — I129 Hypertensive chronic kidney disease with stage 1 through stage 4 chronic kidney disease, or unspecified chronic kidney disease: Secondary | ICD-10-CM | POA: Diagnosis not present

## 2022-11-29 DIAGNOSIS — L299 Pruritus, unspecified: Secondary | ICD-10-CM | POA: Diagnosis not present

## 2022-11-29 DIAGNOSIS — Z992 Dependence on renal dialysis: Secondary | ICD-10-CM | POA: Diagnosis not present

## 2022-11-29 DIAGNOSIS — E8770 Fluid overload, unspecified: Secondary | ICD-10-CM | POA: Diagnosis not present

## 2022-11-29 DIAGNOSIS — N186 End stage renal disease: Secondary | ICD-10-CM | POA: Diagnosis not present

## 2022-11-29 DIAGNOSIS — N2581 Secondary hyperparathyroidism of renal origin: Secondary | ICD-10-CM | POA: Diagnosis not present

## 2022-12-01 DIAGNOSIS — L299 Pruritus, unspecified: Secondary | ICD-10-CM | POA: Diagnosis not present

## 2022-12-01 DIAGNOSIS — Z992 Dependence on renal dialysis: Secondary | ICD-10-CM | POA: Diagnosis not present

## 2022-12-01 DIAGNOSIS — N2581 Secondary hyperparathyroidism of renal origin: Secondary | ICD-10-CM | POA: Diagnosis not present

## 2022-12-01 DIAGNOSIS — N186 End stage renal disease: Secondary | ICD-10-CM | POA: Diagnosis not present

## 2022-12-01 DIAGNOSIS — E8770 Fluid overload, unspecified: Secondary | ICD-10-CM | POA: Diagnosis not present

## 2022-12-03 DIAGNOSIS — L299 Pruritus, unspecified: Secondary | ICD-10-CM | POA: Diagnosis not present

## 2022-12-03 DIAGNOSIS — E8770 Fluid overload, unspecified: Secondary | ICD-10-CM | POA: Diagnosis not present

## 2022-12-03 DIAGNOSIS — Z992 Dependence on renal dialysis: Secondary | ICD-10-CM | POA: Diagnosis not present

## 2022-12-03 DIAGNOSIS — N2581 Secondary hyperparathyroidism of renal origin: Secondary | ICD-10-CM | POA: Diagnosis not present

## 2022-12-03 DIAGNOSIS — N186 End stage renal disease: Secondary | ICD-10-CM | POA: Diagnosis not present

## 2022-12-04 DIAGNOSIS — E8770 Fluid overload, unspecified: Secondary | ICD-10-CM | POA: Diagnosis not present

## 2022-12-04 DIAGNOSIS — Z992 Dependence on renal dialysis: Secondary | ICD-10-CM | POA: Diagnosis not present

## 2022-12-04 DIAGNOSIS — L299 Pruritus, unspecified: Secondary | ICD-10-CM | POA: Diagnosis not present

## 2022-12-04 DIAGNOSIS — N2581 Secondary hyperparathyroidism of renal origin: Secondary | ICD-10-CM | POA: Diagnosis not present

## 2022-12-04 DIAGNOSIS — N186 End stage renal disease: Secondary | ICD-10-CM | POA: Diagnosis not present

## 2022-12-06 DIAGNOSIS — N186 End stage renal disease: Secondary | ICD-10-CM | POA: Diagnosis not present

## 2022-12-06 DIAGNOSIS — L299 Pruritus, unspecified: Secondary | ICD-10-CM | POA: Diagnosis not present

## 2022-12-06 DIAGNOSIS — Z992 Dependence on renal dialysis: Secondary | ICD-10-CM | POA: Diagnosis not present

## 2022-12-06 DIAGNOSIS — N2581 Secondary hyperparathyroidism of renal origin: Secondary | ICD-10-CM | POA: Diagnosis not present

## 2022-12-06 DIAGNOSIS — E8770 Fluid overload, unspecified: Secondary | ICD-10-CM | POA: Diagnosis not present

## 2022-12-08 DIAGNOSIS — Z992 Dependence on renal dialysis: Secondary | ICD-10-CM | POA: Diagnosis not present

## 2022-12-08 DIAGNOSIS — N2581 Secondary hyperparathyroidism of renal origin: Secondary | ICD-10-CM | POA: Diagnosis not present

## 2022-12-08 DIAGNOSIS — N186 End stage renal disease: Secondary | ICD-10-CM | POA: Diagnosis not present

## 2022-12-08 DIAGNOSIS — L299 Pruritus, unspecified: Secondary | ICD-10-CM | POA: Diagnosis not present

## 2022-12-08 DIAGNOSIS — E8770 Fluid overload, unspecified: Secondary | ICD-10-CM | POA: Diagnosis not present

## 2022-12-10 DIAGNOSIS — N2581 Secondary hyperparathyroidism of renal origin: Secondary | ICD-10-CM | POA: Diagnosis not present

## 2022-12-10 DIAGNOSIS — Z992 Dependence on renal dialysis: Secondary | ICD-10-CM | POA: Diagnosis not present

## 2022-12-10 DIAGNOSIS — E8770 Fluid overload, unspecified: Secondary | ICD-10-CM | POA: Diagnosis not present

## 2022-12-10 DIAGNOSIS — L299 Pruritus, unspecified: Secondary | ICD-10-CM | POA: Diagnosis not present

## 2022-12-10 DIAGNOSIS — N186 End stage renal disease: Secondary | ICD-10-CM | POA: Diagnosis not present

## 2022-12-13 DIAGNOSIS — Z992 Dependence on renal dialysis: Secondary | ICD-10-CM | POA: Diagnosis not present

## 2022-12-13 DIAGNOSIS — N186 End stage renal disease: Secondary | ICD-10-CM | POA: Diagnosis not present

## 2022-12-13 DIAGNOSIS — N2581 Secondary hyperparathyroidism of renal origin: Secondary | ICD-10-CM | POA: Diagnosis not present

## 2022-12-13 DIAGNOSIS — L299 Pruritus, unspecified: Secondary | ICD-10-CM | POA: Diagnosis not present

## 2022-12-13 DIAGNOSIS — E8770 Fluid overload, unspecified: Secondary | ICD-10-CM | POA: Diagnosis not present

## 2022-12-15 DIAGNOSIS — N186 End stage renal disease: Secondary | ICD-10-CM | POA: Diagnosis not present

## 2022-12-15 DIAGNOSIS — Z992 Dependence on renal dialysis: Secondary | ICD-10-CM | POA: Diagnosis not present

## 2022-12-15 DIAGNOSIS — N2581 Secondary hyperparathyroidism of renal origin: Secondary | ICD-10-CM | POA: Diagnosis not present

## 2022-12-15 DIAGNOSIS — E8770 Fluid overload, unspecified: Secondary | ICD-10-CM | POA: Diagnosis not present

## 2022-12-15 DIAGNOSIS — L299 Pruritus, unspecified: Secondary | ICD-10-CM | POA: Diagnosis not present

## 2022-12-16 DIAGNOSIS — E1140 Type 2 diabetes mellitus with diabetic neuropathy, unspecified: Secondary | ICD-10-CM | POA: Diagnosis not present

## 2022-12-16 DIAGNOSIS — G8929 Other chronic pain: Secondary | ICD-10-CM | POA: Diagnosis not present

## 2022-12-16 DIAGNOSIS — R1013 Epigastric pain: Secondary | ICD-10-CM | POA: Diagnosis not present

## 2022-12-16 DIAGNOSIS — Z681 Body mass index (BMI) 19 or less, adult: Secondary | ICD-10-CM | POA: Diagnosis not present

## 2022-12-16 DIAGNOSIS — Z79899 Other long term (current) drug therapy: Secondary | ICD-10-CM | POA: Diagnosis not present

## 2022-12-16 DIAGNOSIS — I10 Essential (primary) hypertension: Secondary | ICD-10-CM | POA: Diagnosis not present

## 2022-12-16 DIAGNOSIS — Z9181 History of falling: Secondary | ICD-10-CM | POA: Diagnosis not present

## 2022-12-16 DIAGNOSIS — E1065 Type 1 diabetes mellitus with hyperglycemia: Secondary | ICD-10-CM | POA: Diagnosis not present

## 2022-12-16 DIAGNOSIS — M5416 Radiculopathy, lumbar region: Secondary | ICD-10-CM | POA: Diagnosis not present

## 2022-12-17 DIAGNOSIS — Z992 Dependence on renal dialysis: Secondary | ICD-10-CM | POA: Diagnosis not present

## 2022-12-17 DIAGNOSIS — N2581 Secondary hyperparathyroidism of renal origin: Secondary | ICD-10-CM | POA: Diagnosis not present

## 2022-12-17 DIAGNOSIS — N186 End stage renal disease: Secondary | ICD-10-CM | POA: Diagnosis not present

## 2022-12-17 DIAGNOSIS — L299 Pruritus, unspecified: Secondary | ICD-10-CM | POA: Diagnosis not present

## 2022-12-17 DIAGNOSIS — E8770 Fluid overload, unspecified: Secondary | ICD-10-CM | POA: Diagnosis not present

## 2022-12-20 DIAGNOSIS — N186 End stage renal disease: Secondary | ICD-10-CM | POA: Diagnosis not present

## 2022-12-20 DIAGNOSIS — E8770 Fluid overload, unspecified: Secondary | ICD-10-CM | POA: Diagnosis not present

## 2022-12-20 DIAGNOSIS — N2581 Secondary hyperparathyroidism of renal origin: Secondary | ICD-10-CM | POA: Diagnosis not present

## 2022-12-20 DIAGNOSIS — L299 Pruritus, unspecified: Secondary | ICD-10-CM | POA: Diagnosis not present

## 2022-12-20 DIAGNOSIS — Z992 Dependence on renal dialysis: Secondary | ICD-10-CM | POA: Diagnosis not present

## 2022-12-21 ENCOUNTER — Encounter (HOSPITAL_COMMUNITY): Payer: Self-pay

## 2022-12-22 DIAGNOSIS — L299 Pruritus, unspecified: Secondary | ICD-10-CM | POA: Diagnosis not present

## 2022-12-22 DIAGNOSIS — N186 End stage renal disease: Secondary | ICD-10-CM | POA: Diagnosis not present

## 2022-12-22 DIAGNOSIS — N2581 Secondary hyperparathyroidism of renal origin: Secondary | ICD-10-CM | POA: Diagnosis not present

## 2022-12-22 DIAGNOSIS — Z992 Dependence on renal dialysis: Secondary | ICD-10-CM | POA: Diagnosis not present

## 2022-12-22 DIAGNOSIS — E8770 Fluid overload, unspecified: Secondary | ICD-10-CM | POA: Diagnosis not present

## 2022-12-24 DIAGNOSIS — L299 Pruritus, unspecified: Secondary | ICD-10-CM | POA: Diagnosis not present

## 2022-12-24 DIAGNOSIS — N186 End stage renal disease: Secondary | ICD-10-CM | POA: Diagnosis not present

## 2022-12-24 DIAGNOSIS — N2581 Secondary hyperparathyroidism of renal origin: Secondary | ICD-10-CM | POA: Diagnosis not present

## 2022-12-24 DIAGNOSIS — Z992 Dependence on renal dialysis: Secondary | ICD-10-CM | POA: Diagnosis not present

## 2022-12-24 DIAGNOSIS — E8770 Fluid overload, unspecified: Secondary | ICD-10-CM | POA: Diagnosis not present

## 2022-12-27 DIAGNOSIS — E8770 Fluid overload, unspecified: Secondary | ICD-10-CM | POA: Diagnosis not present

## 2022-12-27 DIAGNOSIS — L299 Pruritus, unspecified: Secondary | ICD-10-CM | POA: Diagnosis not present

## 2022-12-27 DIAGNOSIS — N186 End stage renal disease: Secondary | ICD-10-CM | POA: Diagnosis not present

## 2022-12-27 DIAGNOSIS — N2581 Secondary hyperparathyroidism of renal origin: Secondary | ICD-10-CM | POA: Diagnosis not present

## 2022-12-27 DIAGNOSIS — Z992 Dependence on renal dialysis: Secondary | ICD-10-CM | POA: Diagnosis not present

## 2022-12-29 DIAGNOSIS — E8770 Fluid overload, unspecified: Secondary | ICD-10-CM | POA: Diagnosis not present

## 2022-12-29 DIAGNOSIS — Z992 Dependence on renal dialysis: Secondary | ICD-10-CM | POA: Diagnosis not present

## 2022-12-29 DIAGNOSIS — N186 End stage renal disease: Secondary | ICD-10-CM | POA: Diagnosis not present

## 2022-12-29 DIAGNOSIS — L299 Pruritus, unspecified: Secondary | ICD-10-CM | POA: Diagnosis not present

## 2022-12-29 DIAGNOSIS — N2581 Secondary hyperparathyroidism of renal origin: Secondary | ICD-10-CM | POA: Diagnosis not present

## 2022-12-29 DIAGNOSIS — I129 Hypertensive chronic kidney disease with stage 1 through stage 4 chronic kidney disease, or unspecified chronic kidney disease: Secondary | ICD-10-CM | POA: Diagnosis not present

## 2022-12-31 DIAGNOSIS — Z992 Dependence on renal dialysis: Secondary | ICD-10-CM | POA: Diagnosis not present

## 2022-12-31 DIAGNOSIS — N186 End stage renal disease: Secondary | ICD-10-CM | POA: Diagnosis not present

## 2022-12-31 DIAGNOSIS — L299 Pruritus, unspecified: Secondary | ICD-10-CM | POA: Diagnosis not present

## 2022-12-31 DIAGNOSIS — N2581 Secondary hyperparathyroidism of renal origin: Secondary | ICD-10-CM | POA: Diagnosis not present

## 2023-01-03 DIAGNOSIS — N2581 Secondary hyperparathyroidism of renal origin: Secondary | ICD-10-CM | POA: Diagnosis not present

## 2023-01-03 DIAGNOSIS — Z992 Dependence on renal dialysis: Secondary | ICD-10-CM | POA: Diagnosis not present

## 2023-01-03 DIAGNOSIS — N186 End stage renal disease: Secondary | ICD-10-CM | POA: Diagnosis not present

## 2023-01-03 DIAGNOSIS — L299 Pruritus, unspecified: Secondary | ICD-10-CM | POA: Diagnosis not present

## 2023-01-04 ENCOUNTER — Ambulatory Visit (INDEPENDENT_AMBULATORY_CARE_PROVIDER_SITE_OTHER): Payer: Medicare HMO | Admitting: Podiatry

## 2023-01-04 DIAGNOSIS — M109 Gout, unspecified: Secondary | ICD-10-CM | POA: Diagnosis not present

## 2023-01-04 DIAGNOSIS — E1142 Type 2 diabetes mellitus with diabetic polyneuropathy: Secondary | ICD-10-CM | POA: Diagnosis not present

## 2023-01-05 DIAGNOSIS — Z992 Dependence on renal dialysis: Secondary | ICD-10-CM | POA: Diagnosis not present

## 2023-01-05 DIAGNOSIS — L299 Pruritus, unspecified: Secondary | ICD-10-CM | POA: Diagnosis not present

## 2023-01-05 DIAGNOSIS — N186 End stage renal disease: Secondary | ICD-10-CM | POA: Diagnosis not present

## 2023-01-05 DIAGNOSIS — N2581 Secondary hyperparathyroidism of renal origin: Secondary | ICD-10-CM | POA: Diagnosis not present

## 2023-01-05 NOTE — Progress Notes (Signed)
  Subjective:  Patient ID: Annayah Munz, female    DOB: 10-27-1990,  MRN: [id number]  Chief Complaint  Patient presents with   Foot Pain    Sore feet and toes pt stated that she on dialysis. The pain is constant and it switches from side to side. The pain is a 9 right now.    32 y.o. female presents with the above complaint. History confirmed with patient.   Objective:  Physical Exam: Her feet are warm and well-perfused, she has palpable pulses.  She has a lack of protection sensation to the midfoot and anterior ankle there is edema.  Significant paresthesias and pain to tenderness and touch Assessment:   1. Gout of both feet   2. Diabetic peripheral neuropathy (HCC)      Plan:  Patient was evaluated and treated and all questions answered.  We discussed her chronic foot pain, I suspect likely the majority of this is diabetic peripheral neuropathy.  Her A1c is severely uncontrolled, she has an A1c over 15%.  She currently takes gabapentin.  I did recommend checking for the possibility of gout being a factor here as well considering her chronic kidney disease.  Unfortunately much of this is not likely to improve without control of her A1c.  Return if symptoms worsen or fail to improve.

## 2023-01-07 DIAGNOSIS — N186 End stage renal disease: Secondary | ICD-10-CM | POA: Diagnosis not present

## 2023-01-07 DIAGNOSIS — Z992 Dependence on renal dialysis: Secondary | ICD-10-CM | POA: Diagnosis not present

## 2023-01-07 DIAGNOSIS — L299 Pruritus, unspecified: Secondary | ICD-10-CM | POA: Diagnosis not present

## 2023-01-07 DIAGNOSIS — N2581 Secondary hyperparathyroidism of renal origin: Secondary | ICD-10-CM | POA: Diagnosis not present

## 2023-01-10 DIAGNOSIS — N2581 Secondary hyperparathyroidism of renal origin: Secondary | ICD-10-CM | POA: Diagnosis not present

## 2023-01-10 DIAGNOSIS — L299 Pruritus, unspecified: Secondary | ICD-10-CM | POA: Diagnosis not present

## 2023-01-10 DIAGNOSIS — N186 End stage renal disease: Secondary | ICD-10-CM | POA: Diagnosis not present

## 2023-01-10 DIAGNOSIS — Z992 Dependence on renal dialysis: Secondary | ICD-10-CM | POA: Diagnosis not present

## 2023-01-12 DIAGNOSIS — Z992 Dependence on renal dialysis: Secondary | ICD-10-CM | POA: Diagnosis not present

## 2023-01-12 DIAGNOSIS — L299 Pruritus, unspecified: Secondary | ICD-10-CM | POA: Diagnosis not present

## 2023-01-12 DIAGNOSIS — N186 End stage renal disease: Secondary | ICD-10-CM | POA: Diagnosis not present

## 2023-01-12 DIAGNOSIS — N2581 Secondary hyperparathyroidism of renal origin: Secondary | ICD-10-CM | POA: Diagnosis not present

## 2023-01-14 DIAGNOSIS — N186 End stage renal disease: Secondary | ICD-10-CM | POA: Diagnosis not present

## 2023-01-14 DIAGNOSIS — N2581 Secondary hyperparathyroidism of renal origin: Secondary | ICD-10-CM | POA: Diagnosis not present

## 2023-01-14 DIAGNOSIS — L299 Pruritus, unspecified: Secondary | ICD-10-CM | POA: Diagnosis not present

## 2023-01-14 DIAGNOSIS — Z992 Dependence on renal dialysis: Secondary | ICD-10-CM | POA: Diagnosis not present

## 2023-01-17 DIAGNOSIS — I10 Essential (primary) hypertension: Secondary | ICD-10-CM | POA: Diagnosis not present

## 2023-01-17 DIAGNOSIS — Z681 Body mass index (BMI) 19 or less, adult: Secondary | ICD-10-CM | POA: Diagnosis not present

## 2023-01-17 DIAGNOSIS — E1065 Type 1 diabetes mellitus with hyperglycemia: Secondary | ICD-10-CM | POA: Diagnosis not present

## 2023-01-17 DIAGNOSIS — M5416 Radiculopathy, lumbar region: Secondary | ICD-10-CM | POA: Diagnosis not present

## 2023-01-17 DIAGNOSIS — Z79899 Other long term (current) drug therapy: Secondary | ICD-10-CM | POA: Diagnosis not present

## 2023-01-17 DIAGNOSIS — N186 End stage renal disease: Secondary | ICD-10-CM | POA: Diagnosis not present

## 2023-01-17 DIAGNOSIS — Z992 Dependence on renal dialysis: Secondary | ICD-10-CM | POA: Diagnosis not present

## 2023-01-17 DIAGNOSIS — G8929 Other chronic pain: Secondary | ICD-10-CM | POA: Diagnosis not present

## 2023-01-17 DIAGNOSIS — L299 Pruritus, unspecified: Secondary | ICD-10-CM | POA: Diagnosis not present

## 2023-01-17 DIAGNOSIS — Z1159 Encounter for screening for other viral diseases: Secondary | ICD-10-CM | POA: Diagnosis not present

## 2023-01-17 DIAGNOSIS — E1140 Type 2 diabetes mellitus with diabetic neuropathy, unspecified: Secondary | ICD-10-CM | POA: Diagnosis not present

## 2023-01-17 DIAGNOSIS — N2581 Secondary hyperparathyroidism of renal origin: Secondary | ICD-10-CM | POA: Diagnosis not present

## 2023-01-17 DIAGNOSIS — Z9181 History of falling: Secondary | ICD-10-CM | POA: Diagnosis not present

## 2023-01-17 DIAGNOSIS — R1013 Epigastric pain: Secondary | ICD-10-CM | POA: Diagnosis not present

## 2023-01-19 DIAGNOSIS — L299 Pruritus, unspecified: Secondary | ICD-10-CM | POA: Diagnosis not present

## 2023-01-19 DIAGNOSIS — N2581 Secondary hyperparathyroidism of renal origin: Secondary | ICD-10-CM | POA: Diagnosis not present

## 2023-01-19 DIAGNOSIS — Z992 Dependence on renal dialysis: Secondary | ICD-10-CM | POA: Diagnosis not present

## 2023-01-19 DIAGNOSIS — N186 End stage renal disease: Secondary | ICD-10-CM | POA: Diagnosis not present

## 2023-01-21 DIAGNOSIS — N2581 Secondary hyperparathyroidism of renal origin: Secondary | ICD-10-CM | POA: Diagnosis not present

## 2023-01-21 DIAGNOSIS — N186 End stage renal disease: Secondary | ICD-10-CM | POA: Diagnosis not present

## 2023-01-21 DIAGNOSIS — L299 Pruritus, unspecified: Secondary | ICD-10-CM | POA: Diagnosis not present

## 2023-01-21 DIAGNOSIS — Z992 Dependence on renal dialysis: Secondary | ICD-10-CM | POA: Diagnosis not present

## 2023-01-24 DIAGNOSIS — N186 End stage renal disease: Secondary | ICD-10-CM | POA: Diagnosis not present

## 2023-01-24 DIAGNOSIS — L299 Pruritus, unspecified: Secondary | ICD-10-CM | POA: Diagnosis not present

## 2023-01-24 DIAGNOSIS — N2581 Secondary hyperparathyroidism of renal origin: Secondary | ICD-10-CM | POA: Diagnosis not present

## 2023-01-24 DIAGNOSIS — Z992 Dependence on renal dialysis: Secondary | ICD-10-CM | POA: Diagnosis not present

## 2023-01-26 DIAGNOSIS — Z992 Dependence on renal dialysis: Secondary | ICD-10-CM | POA: Diagnosis not present

## 2023-01-26 DIAGNOSIS — L299 Pruritus, unspecified: Secondary | ICD-10-CM | POA: Diagnosis not present

## 2023-01-26 DIAGNOSIS — N2581 Secondary hyperparathyroidism of renal origin: Secondary | ICD-10-CM | POA: Diagnosis not present

## 2023-01-26 DIAGNOSIS — N186 End stage renal disease: Secondary | ICD-10-CM | POA: Diagnosis not present

## 2023-01-28 DIAGNOSIS — N186 End stage renal disease: Secondary | ICD-10-CM | POA: Diagnosis not present

## 2023-01-28 DIAGNOSIS — L299 Pruritus, unspecified: Secondary | ICD-10-CM | POA: Diagnosis not present

## 2023-01-28 DIAGNOSIS — Z992 Dependence on renal dialysis: Secondary | ICD-10-CM | POA: Diagnosis not present

## 2023-01-28 DIAGNOSIS — N2581 Secondary hyperparathyroidism of renal origin: Secondary | ICD-10-CM | POA: Diagnosis not present

## 2023-01-29 DIAGNOSIS — Z992 Dependence on renal dialysis: Secondary | ICD-10-CM | POA: Diagnosis not present

## 2023-01-29 DIAGNOSIS — N186 End stage renal disease: Secondary | ICD-10-CM | POA: Diagnosis not present

## 2023-01-29 DIAGNOSIS — I129 Hypertensive chronic kidney disease with stage 1 through stage 4 chronic kidney disease, or unspecified chronic kidney disease: Secondary | ICD-10-CM | POA: Diagnosis not present

## 2023-01-31 DIAGNOSIS — N2581 Secondary hyperparathyroidism of renal origin: Secondary | ICD-10-CM | POA: Diagnosis not present

## 2023-01-31 DIAGNOSIS — N186 End stage renal disease: Secondary | ICD-10-CM | POA: Diagnosis not present

## 2023-01-31 DIAGNOSIS — L299 Pruritus, unspecified: Secondary | ICD-10-CM | POA: Diagnosis not present

## 2023-01-31 DIAGNOSIS — Z992 Dependence on renal dialysis: Secondary | ICD-10-CM | POA: Diagnosis not present

## 2023-02-02 ENCOUNTER — Observation Stay (HOSPITAL_COMMUNITY): Payer: Medicare HMO

## 2023-02-02 ENCOUNTER — Encounter (HOSPITAL_COMMUNITY): Payer: Self-pay

## 2023-02-02 ENCOUNTER — Observation Stay (HOSPITAL_COMMUNITY)
Admission: EM | Admit: 2023-02-02 | Discharge: 2023-02-03 | Disposition: A | Discharge status: Home / Self Care | Payer: Medicare HMO | Attending: Internal Medicine | Admitting: Internal Medicine

## 2023-02-02 ENCOUNTER — Other Ambulatory Visit: Payer: Self-pay

## 2023-02-02 DIAGNOSIS — E871 Hypo-osmolality and hyponatremia: Secondary | ICD-10-CM | POA: Diagnosis not present

## 2023-02-02 DIAGNOSIS — E11649 Type 2 diabetes mellitus with hypoglycemia without coma: Secondary | ICD-10-CM

## 2023-02-02 DIAGNOSIS — E162 Hypoglycemia, unspecified: Secondary | ICD-10-CM | POA: Diagnosis not present

## 2023-02-02 DIAGNOSIS — R188 Other ascites: Secondary | ICD-10-CM | POA: Insufficient documentation

## 2023-02-02 DIAGNOSIS — D649 Anemia, unspecified: Secondary | ICD-10-CM | POA: Diagnosis not present

## 2023-02-02 DIAGNOSIS — E1122 Type 2 diabetes mellitus with diabetic chronic kidney disease: Secondary | ICD-10-CM | POA: Diagnosis not present

## 2023-02-02 DIAGNOSIS — E1022 Type 1 diabetes mellitus with diabetic chronic kidney disease: Secondary | ICD-10-CM | POA: Diagnosis not present

## 2023-02-02 DIAGNOSIS — R109 Unspecified abdominal pain: Secondary | ICD-10-CM | POA: Diagnosis not present

## 2023-02-02 DIAGNOSIS — Z79899 Other long term (current) drug therapy: Secondary | ICD-10-CM | POA: Insufficient documentation

## 2023-02-02 DIAGNOSIS — Z992 Dependence on renal dialysis: Secondary | ICD-10-CM

## 2023-02-02 DIAGNOSIS — J9811 Atelectasis: Secondary | ICD-10-CM | POA: Diagnosis not present

## 2023-02-02 DIAGNOSIS — I120 Hypertensive chronic kidney disease with stage 5 chronic kidney disease or end stage renal disease: Secondary | ICD-10-CM | POA: Diagnosis not present

## 2023-02-02 DIAGNOSIS — R9431 Abnormal electrocardiogram [ECG] [EKG]: Secondary | ICD-10-CM | POA: Diagnosis not present

## 2023-02-02 DIAGNOSIS — E10649 Type 1 diabetes mellitus with hypoglycemia without coma: Principal | ICD-10-CM | POA: Insufficient documentation

## 2023-02-02 DIAGNOSIS — Z87891 Personal history of nicotine dependence: Secondary | ICD-10-CM | POA: Insufficient documentation

## 2023-02-02 DIAGNOSIS — R197 Diarrhea, unspecified: Secondary | ICD-10-CM | POA: Insufficient documentation

## 2023-02-02 DIAGNOSIS — R918 Other nonspecific abnormal finding of lung field: Secondary | ICD-10-CM | POA: Diagnosis not present

## 2023-02-02 DIAGNOSIS — N186 End stage renal disease: Secondary | ICD-10-CM | POA: Diagnosis not present

## 2023-02-02 DIAGNOSIS — I10 Essential (primary) hypertension: Secondary | ICD-10-CM | POA: Diagnosis present

## 2023-02-02 DIAGNOSIS — E875 Hyperkalemia: Secondary | ICD-10-CM | POA: Diagnosis not present

## 2023-02-02 DIAGNOSIS — Z794 Long term (current) use of insulin: Secondary | ICD-10-CM | POA: Diagnosis not present

## 2023-02-02 DIAGNOSIS — R6889 Other general symptoms and signs: Secondary | ICD-10-CM | POA: Diagnosis not present

## 2023-02-02 DIAGNOSIS — E440 Moderate protein-calorie malnutrition: Secondary | ICD-10-CM | POA: Diagnosis present

## 2023-02-02 LAB — CBC WITH DIFFERENTIAL/PLATELET
Abs Immature Granulocytes: 0.03 10*3/uL (ref 0.00–0.07)
Basophils Absolute: 0 10*3/uL (ref 0.0–0.1)
Basophils Relative: 1 %
Eosinophils Absolute: 0.1 10*3/uL (ref 0.0–0.5)
Eosinophils Relative: 2 %
HCT: 34.9 % — ABNORMAL LOW (ref 36.0–46.0)
Hemoglobin: 10.4 g/dL — ABNORMAL LOW (ref 12.0–15.0)
Immature Granulocytes: 1 %
Lymphocytes Relative: 22 %
Lymphs Abs: 1 10*3/uL (ref 0.7–4.0)
MCH: 28 pg (ref 26.0–34.0)
MCHC: 29.8 g/dL — ABNORMAL LOW (ref 30.0–36.0)
MCV: 93.8 fL (ref 80.0–100.0)
Monocytes Absolute: 0.3 10*3/uL (ref 0.1–1.0)
Monocytes Relative: 6 %
Neutro Abs: 3.2 10*3/uL (ref 1.7–7.7)
Neutrophils Relative %: 68 %
Platelets: 163 10*3/uL (ref 150–400)
RBC: 3.72 MIL/uL — ABNORMAL LOW (ref 3.87–5.11)
RDW: 14.9 % (ref 11.5–15.5)
WBC: 4.7 10*3/uL (ref 4.0–10.5)
nRBC: 0 % (ref 0.0–0.2)

## 2023-02-02 LAB — BASIC METABOLIC PANEL
Anion gap: 9 (ref 5–15)
Anion gap: 12 (ref 5–15)
BUN: 52 mg/dL — ABNORMAL HIGH (ref 6–20)
BUN: 57 mg/dL — ABNORMAL HIGH (ref 6–20)
CO2: 21 mmol/L — ABNORMAL LOW (ref 22–32)
CO2: 22 mmol/L (ref 22–32)
Calcium: 7.3 mg/dL — ABNORMAL LOW (ref 8.9–10.3)
Calcium: 7.9 mg/dL — ABNORMAL LOW (ref 8.9–10.3)
Chloride: 95 mmol/L — ABNORMAL LOW (ref 98–111)
Chloride: 88 mmol/L — ABNORMAL LOW (ref 98–111)
Creatinine, Ser: 5.57 mg/dL — ABNORMAL HIGH (ref 0.44–1.00)
Creatinine, Ser: 6.09 mg/dL — ABNORMAL HIGH (ref 0.44–1.00)
GFR, Estimated: 10 mL/min — ABNORMAL LOW (ref >60)
GFR, Estimated: 9 mL/min — ABNORMAL LOW (ref >60)
Glucose, Bld: 223 mg/dL — ABNORMAL HIGH (ref 70–99)
Glucose, Bld: 151 mg/dL — ABNORMAL HIGH (ref 70–99)
Potassium: 6.3 mmol/L (ref 3.5–5.1)
Potassium: 6.1 mmol/L — ABNORMAL HIGH (ref 3.5–5.1)
Sodium: 125 mmol/L — ABNORMAL LOW (ref 135–145)
Sodium: 122 mmol/L — ABNORMAL LOW (ref 135–145)

## 2023-02-02 LAB — CBG MONITORING, ED
Glucose-Capillary: 114 mg/dL — ABNORMAL HIGH (ref 70–99)
Glucose-Capillary: 137 mg/dL — ABNORMAL HIGH (ref 70–99)
Glucose-Capillary: 33 mg/dL — CL (ref 70–99)
Glucose-Capillary: 60 mg/dL — ABNORMAL LOW (ref 70–99)
Glucose-Capillary: 87 mg/dL (ref 70–99)

## 2023-02-02 LAB — POTASSIUM: Potassium: 6 mmol/L — ABNORMAL HIGH (ref 3.5–5.1)

## 2023-02-02 LAB — HCG, SERUM, QUALITATIVE: Preg, Serum: NEGATIVE

## 2023-02-02 LAB — HEPATITIS B SURFACE ANTIGEN: Hepatitis B Surface Ag: NONREACTIVE

## 2023-02-02 MED ORDER — PENTAFLUOROPROP-TETRAFLUOROETH EX AERO: 1.0000 | INHALATION_SPRAY | CUTANEOUS | Status: DC | PRN

## 2023-02-02 MED ORDER — HEPARIN SODIUM (PORCINE) 1000 UNIT/ML DIALYSIS: 1000.0000 [IU] | INTRAMUSCULAR | Status: DC | PRN

## 2023-02-02 MED ORDER — ALTEPLASE 2 MG IJ SOLR: 2.0000 mg | Freq: Once | INTRAMUSCULAR | Status: DC | PRN

## 2023-02-02 MED ORDER — GLUCERNA SHAKE PO LIQD: 237.0000 mL | Freq: Three times a day (TID) | ORAL | Status: DC

## 2023-02-02 MED ORDER — SODIUM CHLORIDE 0.9% FLUSH: 3.0000 mL | INTRAVENOUS | Status: DC | PRN

## 2023-02-02 MED ORDER — PANTOPRAZOLE SODIUM 40 MG PO TBEC
40.0000 mg | DELAYED_RELEASE_TABLET | Freq: Every day | ORAL | Status: DC
  Administered 2023-02-03: 40 mg via ORAL
  Filled 2023-02-02: qty 1

## 2023-02-02 MED ORDER — HEPARIN SODIUM (PORCINE) 1000 UNIT/ML DIALYSIS: 2500.0000 [IU] | Freq: Once | INTRAMUSCULAR | Status: DC

## 2023-02-02 MED ORDER — DEXTROSE 5 % IV SOLN: Freq: Once | INTRAVENOUS | Status: AC

## 2023-02-02 MED ORDER — ROSUVASTATIN CALCIUM 20 MG PO TABS: 20.0000 mg | ORAL_TABLET | Freq: Every day | ORAL | Status: DC

## 2023-02-02 MED ORDER — HEPARIN SODIUM (PORCINE) 5000 UNIT/ML IJ SOLN
5000.0000 [IU] | Freq: Three times a day (TID) | INTRAMUSCULAR | Status: DC
  Filled 2023-02-02: qty 1

## 2023-02-02 MED ORDER — HEPARIN SODIUM (PORCINE) 1000 UNIT/ML DIALYSIS
2500.0000 [IU] | Freq: Once | INTRAMUSCULAR | Status: DC
  Filled 2023-02-02: qty 3

## 2023-02-02 MED ORDER — LOPERAMIDE HCL 2 MG PO CAPS
4.0000 mg | ORAL_CAPSULE | Freq: Once | ORAL | Status: AC
  Administered 2023-02-02: 4 mg via ORAL
  Filled 2023-02-02: qty 2

## 2023-02-02 MED ORDER — GABAPENTIN 100 MG PO CAPS: 100.0000 mg | ORAL_CAPSULE | Freq: Every day | ORAL | Status: DC

## 2023-02-02 MED ORDER — SODIUM CHLORIDE 0.9% FLUSH
3.0000 mL | Freq: Two times a day (BID) | INTRAVENOUS | Status: DC
  Administered 2023-02-02 – 2023-02-03 (×3): 3 mL via INTRAVENOUS

## 2023-02-02 MED ORDER — LIDOCAINE-PRILOCAINE 2.5-2.5 % EX CREA: 1.0000 | TOPICAL_CREAM | CUTANEOUS | Status: DC | PRN

## 2023-02-02 MED ORDER — OXYCODONE-ACETAMINOPHEN 10-325 MG PO TABS
1.0000 | ORAL_TABLET | Freq: Once | ORAL | Status: DC
  Filled 2023-02-02: qty 1

## 2023-02-02 MED ORDER — CHLORHEXIDINE GLUCONATE CLOTH 2 % EX PADS: 6.0000 | MEDICATED_PAD | Freq: Every day | CUTANEOUS | Status: DC

## 2023-02-02 MED ORDER — DEXTROSE 50 % IV SOLN
INTRAVENOUS | Status: AC
  Filled 2023-02-02: qty 50

## 2023-02-02 MED ORDER — OXYCODONE-ACETAMINOPHEN 5-325 MG PO TABS
2.0000 | ORAL_TABLET | Freq: Once | ORAL | Status: AC
  Administered 2023-02-02: 2 via ORAL
  Filled 2023-02-02: qty 2

## 2023-02-02 MED ORDER — SODIUM CHLORIDE 0.9 % IV SOLN: 1.0000 g | Freq: Once | INTRAVENOUS | Status: DC

## 2023-02-02 MED ORDER — ANTICOAGULANT SODIUM CITRATE 4% (200MG/5ML) IV SOLN: 5.0000 mL | Status: DC | PRN

## 2023-02-02 MED ORDER — LIDOCAINE HCL (PF) 1 % IJ SOLN: 5.0000 mL | INTRAMUSCULAR | Status: DC | PRN

## 2023-02-02 MED ORDER — SODIUM CHLORIDE 0.9 % IV SOLN: 250.0000 mL | INTRAVENOUS | Status: DC | PRN

## 2023-02-02 MED ORDER — DEXTROSE 50 % IV SOLN: 25.0000 g | INTRAVENOUS | Status: AC

## 2023-02-02 MED ORDER — NEPRO/CARBSTEADY PO LIQD: 237.0000 mL | ORAL | Status: DC | PRN

## 2023-02-02 NOTE — Hospital Course (Addendum)
Ann Robertson: [phone]  Came in for hypoglycemia. Takes humalog at home. Reports high sensitivity to insulin. Has frequent hypoglycemia due to insulin use. Last thing she remembers is looking at clock around 0330-0400 overnight. Since last Thursday, feeling poorly, shaky, etc. Last she checked blood glucose was Monday and it was 38. First thing she really remembers is coming to in ambulance on the way to hospital today. She thinks her dad gave her some insulin.

## 2023-02-02 NOTE — ED Triage Notes (Signed)
Pt arrives via ems for home for the c/o low blood sugar, on arrival pt bs was 41. Ems proceeded to give D10, new bs 161. Apparently father gave unknown amount of insulin. Pt also has noted ascites, hx liver issues, dialysis pt, pt has not went to dialysis in around a week. Pt c/o abd pain 10/10

## 2023-02-02 NOTE — ED Notes (Signed)
I informed the nurse that the patient was having increased rate and going into bradycardia.

## 2023-02-02 NOTE — H&P (Cosign Needed Addendum)
Date: 02/02/2023         Patient Name:  Ann Robertson MRN: [id number]  DOB: Feb 20, 1991 Age / Sex: 32 y.o., female   PCP: Felix Pacini, FNP         Medical Service: Internal Medicine Teaching Service         Attending Physician: Dr. Debe Coder, MD    First Contact: Dr. Carmina Miller, DO Pager: [phone]  Second Contact: Dr. Marrianne Mood, MD Pager: [phone]       After Hours (After 5p/  First Contact Pager: [phone]  weekends / holidays): Second Contact Pager: [phone]   Chief Concern: Low blood sugar, diarrhea   History of Present Illness: Ann Robertson is a 32 year old female with a PMH of ESRD (MWF @ [facility]), T1DM, HTN, and HLD who presents to the ED via EMS due to low blood sugar. Patient unable to provide many details. Patient states she last had dialysis on Monday, but it was stopped after 1 hour due to diarrhea. She went to bed early this morning around 3:30 AM, and the next thing she remembers was being loaded into ambulance. Patient states father gave her unknown amount of insulin. Patient endorses feeling "shaky" since this past Thursday. Also endorses chronic abdominal pain (worse with meals) as well as diarrhea without melena/hematochezia that has been on-going for as long as she can remember. Denies chest pain, palpitations, and SOB.  ED Course: -Patient with CBG of 41 as measured by EMS. Patient given dextrose in route to ED, and on arrival, CBG was 114. Patient started on D50 but repeat measurement showed CBG of 33 and patient became symptomatic. Patient also had K+ of 6.3. ECG unremarkable aside from borderline QT prolongation. No peaked T waves. Hypertensive at 140-180/110 -Patient hyponatremic at 125 and with NAGMA     Latest Ref Rng & Units 02/02/2023   12:44 PM 02/02/2023   11:04 AM 06/14/2022    8:17 PM  BMP  Glucose 70 - 99 mg/dL  474    BUN 6 - 20 mg/dL  52    Creatinine 2.59 - 1.00 mg/dL  5.63    Sodium 875 - 643 mmol/L  125  126    Potassium 3.5 - 5.1 mmol/L 6.0  6.3  3.6   Chloride 98 - 111 mmol/L  95    CO2 22 - 32 mmol/L  21    Calcium 8.9 - 10.3 mg/dL  7.3         Latest Ref Rng & Units 02/02/2023   11:04 AM 06/14/2022    8:17 PM 06/14/2022    8:09 PM  CBC  WBC 4.0 - 10.5 K/uL 4.7   5.8   Hemoglobin 12.0 - 15.0 g/dL 32.9  51.8  84.1   Hematocrit 36.0 - 46.0 % 34.9  40.0  33.3   Platelets 150 - 400 K/uL 163   187      Review of Systems  Constitutional:  Positive for malaise/fatigue. Negative for fever and weight loss (dry weight 45 kg).  Respiratory:  Negative for cough.   Gastrointestinal:  Positive for abdominal pain and diarrhea. Negative for blood in stool and melena.  Genitourinary:  Negative for dysuria.  Neurological:  Negative for dizziness.    Allergies: Allergies  Allergen Reactions   Hydromorphone Hcl Itching and Rash     Past Medical History: Patient Active Problem List   Diagnosis Date Noted   Hypoglycemia associated with diabetes (HCC) 02/02/2023   Diarrhea  02/02/2023   Malnutrition of moderate degree 06/08/2022   DKA (diabetic ketoacidosis) (HCC) 06/04/2022   Hyperosmolar hyperglycemic state (HHS) (HCC) 02/21/2022   Hyperglycemia due to diabetes mellitus (HCC) 02/08/2022   ESRD on dialysis (HCC) 06/16/2021   CKD (chronic kidney disease), stage III (HCC) 04/19/2021   Acute metabolic encephalopathy 04/18/2021   Hyperkalemia 04/18/2021   Prolonged QT interval 04/11/2021   Mixed hyperlipidemia 03/09/2021   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 (HCC) 03/04/2021   Diabetic nephropathy (HCC) 02/15/2021   Anemia 01/26/2021   Essential hypertension 01/26/2021   Peripheral neuropathy 01/26/2021   Abdominal pain 10/30/2020   History of preterm delivery, currently pregnant 07/14/2015   History of pre-eclampsia in prior pregnancy, currently pregnant 07/14/2015   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) 05/13/2014    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) 12/06/2013   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   Neuropathy    Preeclampsia    STD (sexually transmitted disease)      Medications: Current Outpatient Medications  Medication Instructions   gabapentin (NEURONTIN) 100 mg, Oral, Daily   Glucagon (BAQSIMI ONE PACK) 3 MG/DOSE POWD 1 each, Nasal, As needed   Glucose 15 GM/32ML GEL Please use gel for low blood sugars per instructions with packaging   hydrOXYzine (ATARAX) 25 mg, Oral, 2 times daily PRN   insulin lispro (HUMALOG) 1-3 Units, Subcutaneous, See admin instructions, Inject 1-3 units up to three times daily with meals (only if eating) per sliding scale based on blood glucose and Carb count.   labetalol (NORMODYNE) 100 mg, Oral, 2 times daily   Lantus SoloStar 3 Units, Subcutaneous, Daily at bedtime   lidocaine-prilocaine (EMLA) cream 1 Application, Topical, As needed   loperamide (IMODIUM) 2 MG capsule Please take one (2mg ) tablet up to four times a day for loose stools, until you have 1-2 bowel movements. Please continue this dose. If you stop having bowel movements, please stop using this medication.   methocarbamol (ROBAXIN) 750 mg, Oral, Every 8 hours PRN   omeprazole (PRILOSEC) 20 mg, Oral, Daily PRN   oseltamivir (TAMIFLU) 30 mg, Oral, Every M-W-F (1800)   oxyCODONE-acetaminophen (PERCOCET) 10-325 MG tablet 1 tablet, Oral, Every 4 hours PRN   phenylephrine-shark liver oil-mineral oil-petrolatum (PREPARATION H) 0.25-14-74.9 % rectal ointment 1 Application, Rectal, 2 times daily PRN   rosuvastatin (CRESTOR) 20 mg, Oral, Daily at bedtime     Surgical History: Past Surgical History:  Procedure Laterality Date   AV FISTULA PLACEMENT Left 06/19/2021   Procedure: INSERTION OF ARTERIOVENOUS (AV) GORE-TEX GRAFT ARM;  Surgeon: Chuck Hint, MD;  Location: [facility] OR;  Service: Vascular;   Laterality: Left;   CESAREAN SECTION     CESAREAN SECTION N/A 07/05/2013   Procedure: CESAREAN SECTION;  Surgeon: Allie Bossier, MD;  Location: [facility] ORS;  Service: Obstetrics;  Laterality: N/A;   CESAREAN SECTION N/A 07/25/2014   Procedure: CESAREAN SECTION;  Surgeon: Reva Bores, MD;  Location: [facility] ORS;  Service: Obstetrics;  Laterality: N/A;   CESAREAN SECTION N/A 11/21/2015   Procedure: CESAREAN SECTION;  Surgeon: Reva Bores, MD;  Location: [facility] BIRTHING SUITES;  Service: Obstetrics;  Laterality: N/A;   IR FLUORO GUIDE CV LINE RIGHT  06/16/2021   IR US GUIDE VASC ACCESS RIGHT  06/16/2021   WISDOM TOOTH EXTRACTION       Family  History:  Family History  Problem Relation Age of Onset   Stroke Mother    Hypertension Mother    Heart disease Mother    Kidney disease Mother    Hyperlipidemia Father    Diabetes Father      Social History:  Social History   Socioeconomic History   Marital status: Single    Spouse name: Not on file   Number of children: Not on file   Years of education: Not on file   Highest education level: Not on file  Occupational History   Not on file  Tobacco Use   Smoking status: Former    Current packs/day: 0.25    Average packs/day: 0.3 packs/day for 9.0 years (2.3 ttl pk-yrs)    Types: Cigarettes   Smokeless tobacco: Never  Vaping Use   Vaping status: Not on file  Substance and Sexual Activity   Alcohol use: Not Currently    Comment: occasional   Drug use: No   Sexual activity: Not Currently    Birth control/protection: None  Other Topics Concern   Not on file  Social History Narrative   Not on file   Social Determinants of Health   Financial Resource Strain: Not on file  Food Insecurity: No Food Insecurity (06/05/2022)   Hunger Vital Sign    Worried About Running Out of Food in the Last Year: Never true    Ran Out of Food in the Last Year: Never true  Transportation Needs: No Transportation Needs (06/05/2022)   PRAPARE - Doctor, general practice (Medical): No    Lack of Transportation (Non-Medical): No  Physical Activity: Not on file  Stress: Not on file  Social Connections: Unknown (09/28/2021)   Received from [facility]   Social Network    Social Network: Not on file  Intimate Partner Violence: Not At Risk (06/05/2022)   Humiliation, Afraid, Rape, and Kick questionnaire    Fear of Current or Ex-Partner: No    Emotionally Abused: No    Physically Abused: No    Sexually Abused: No     Physical Exam: Blood pressure (!) 172/110, pulse 85, temperature (!) 97.5 F (36.4 C), temperature source Axillary, resp. rate 15, height 5\' 5"  (1.651 m), SpO2 98%.  Physical Exam: Constitutional: chronically ill-appearing, sitting in bed, in no acute distress HENT: mucous membranes dry, oropharynx non-erythematous without exudate Cardiovascular: regular rate and rhythm, no m/r/g, 2+ LEE Pulmonary/Chest: normal work of breathing on room air, lungs clear to auscultation bilaterally Abdominal: distended, non-tender, hyperactive bowel sounds, (unable to perform full examination due to diarrhea) Skin: warm and dry Psych: normal mood and behavior    Assessment & Plan:  Ann Robertson is a 32 y.o.female with a PMH of ESRD (MWF @ [facility]), T1DM, HTN, and HLD who presents to the ED via EMS due to low blood sugar.  Principal Problem:   Hypoglycemia associated with diabetes (HCC) Active Problems:   Abdominal pain   Anemia   Essential hypertension   Hyperkalemia   ESRD on dialysis (HCC)   Malnutrition of moderate degree   Diarrhea  #Hypoglycemia Patient has T1DM managed with Novolog 1-3 units TID and Glargine 3 units at bedtime. Patient does not regularly check CBG at home due to low supply of lancets. Patient's father states he gave her 6 units of Lantus this morning after she seemed confused (did not check CBG beforehand). Dextrose given ED in addition to food/juice. Most recent CBG is  87.  Suspect presentation is due to insulin overdose. -Trend CBG's - D50 prn ordered  #ESRD #Hyponatremia #Hyperkalemia #Diarrhea #NAGMA K+ 6.3 on admission (6.0 currently). Na+ 125 on admission. Bicarb is 21. Suspected electrolyte abnormalities are a mixed picture resulting from missed HD and diarrhea that has been more problematic in recent weeks. ECG unremarkable; patient feels weak but denies cardiac symptoms. HD planned for tonight. -Trend renal panel - Loperamide ordered - Repeat ECG - Monitor I/O's - GI panel ordered - Enteric precautions - Encourage PO intake  #Hypertension BP 140-180/110 since admission. HTN managed at home with Labetalol 100 mg BID. Suspect this is secondary to mild volume overload as patient is due for HD today and had incomplete session Monday. Nephrology consulted and will plan for HD tonight. - Consider restarting home Labetalol tomorrow after response from HD - Trend Vitals  #Ascites Patient with history of ascites as noted on 01/2022 Abdominal U/S. This also noted "slightly nodular hepatic margins are noted suggesting hepatic cirrhosis". Patient's abdomen distended, limited exam due to diarrhea concerns expressed by patient. - Order [place] Ascites - CMP ordered - Pregnancy test ordered  #Chronic Normocytic Anemia Hgb 10.4 on admission. Suspect this is secondary to ESRD. Patient receives Mircera with outpatient HD. -Trend CBC  #Malnutrition -Nepro Carb Steady TID -Order dietitian consult -CMP ordered  #HLD - Start home Crestor 20 mg  Diet: Renal with fluid restriction VTE: heparin injection 5,000 Units Start: 02/02/23 1415 Code: Full Surrogate: Hyacinth Meeker: [phone] ([facility])  Signed: Carmina Miller, DO 02/02/2023, 3:39 PM

## 2023-02-02 NOTE — Plan of Care (Signed)

## 2023-02-02 NOTE — ED Provider Notes (Signed)
[facility] AT [facility] Provider Note   CSN: [id number] Arrival date & time: 02/02/23  1022     History  Chief Complaint  Patient presents with   Hypoglycemia    Ann Robertson is a 32 y.o. female with past medical history of CKD requiring dialysis, insulin-dependent type 1 diabetes, and hyperlipidemia presents to the emergency department via EMS for hypoglycemia of 41.  Enroute EMS provided D10 improving CBL to 161. Patient reports that her dad wanted her to be evaluated.  She has no complaints of shortness of breath, chest pain, vomiting. She complains of abdominal pain and distention that is usually relieved with dialysis.  Of note, she receives dialysis on Monday Wednesday and Friday and is due for dialysis today.   Hypoglycemia Associated symptoms: no shortness of breath, no vomiting and no weakness        Home Medications Prior to Admission medications   Medication Sig Start Date End Date Taking? Authorizing Provider  gabapentin (NEURONTIN) 100 MG capsule Take 100 mg by mouth daily. 02/15/22  Yes [provider]  labetalol (NORMODYNE) 100 MG tablet Take 100 mg by mouth 2 (two) times daily. 03/26/21  Yes [provider]  loperamide (IMODIUM) 2 MG capsule Please take one (2mg ) tablet up to four times a day for loose stools, until you have 1-2 bowel movements. Please continue this dose. If you stop having bowel movements, please stop using this medication. 02/24/22  Yes Marolyn Haller, MD  omeprazole (PRILOSEC) 20 MG capsule Take 20 mg by mouth daily as needed (acid reflux/heartburn).   Yes [provider]  Glucagon (BAQSIMI ONE PACK) 3 MG/DOSE POWD Place 1 each into the nose as needed. 02/24/22   Marolyn Haller, MD  Glucose 15 GM/32ML GEL Please use gel for low blood sugars per instructions with packaging 02/24/22   Marolyn Haller, MD  hydrOXYzine (ATARAX) 25 MG tablet Take 1 tablet (25 mg total) by mouth 2 (two)  times daily as needed for itching. 02/24/22   Marolyn Haller, MD  insulin glargine (LANTUS SOLOSTAR) 100 UNIT/ML Solostar Pen Inject 3 Units into the skin at bedtime. Patient taking differently: Inject 7 Units into the skin daily. 02/24/22   Marolyn Haller, MD  insulin lispro (HUMALOG) 100 UNIT/ML KwikPen Inject 1-3 Units into the skin See admin instructions. Inject 1-3 units up to three times daily with meals (only if eating) per sliding scale based on blood glucose and Carb count. 02/24/22   Marolyn Haller, MD  lidocaine-prilocaine (EMLA) cream Apply 1 Application topically as needed (For port access). 06/09/22   Joseph Art, DO  methocarbamol (ROBAXIN) 750 MG tablet Take 750 mg by mouth every 8 (eight) hours as needed for muscle spasms. 02/16/22   [provider]  oseltamivir (TAMIFLU) 30 MG capsule Take 1 capsule (30 mg total) by mouth every Monday, Wednesday, and Friday at 6 PM. 06/09/22   Joseph Art, DO  oxyCODONE-acetaminophen (PERCOCET) 10-325 MG tablet Take 1 tablet by mouth every 4 (four) hours as needed for pain. 02/13/21   [provider]  phenylephrine-shark liver oil-mineral oil-petrolatum (PREPARATION H) 0.25-14-74.9 % rectal ointment Place 1 Application rectally 2 (two) times daily as needed for hemorrhoids. 02/23/22   Marolyn Haller, MD  rosuvastatin (CRESTOR) 20 MG tablet Take 20 mg by mouth at bedtime. 03/09/22   [provider]      Allergies    Hydromorphone hcl    Review of Systems   Review of Systems  Constitutional:  Negative for chills, fatigue and fever.  Respiratory:  Negative for cough, chest tightness and shortness of breath.   Cardiovascular:  Negative for chest pain and leg swelling.  Gastrointestinal:  Positive for abdominal distention, abdominal pain and diarrhea. Negative for vomiting.       Chronic diarrhea for one year unchanged from baseline  Neurological:  Negative for weakness, light-headedness and numbness.     Physical Exam Updated Vital Signs BP (!) 172/110   Pulse 85   Temp (!) 97.5 F (36.4 C) (Axillary)   Resp 15   Ht 5\' 5"  (1.651 m)   SpO2 98%   BMI 22.47 kg/m  Physical Exam Vitals and nursing note reviewed.  Constitutional:      General: She is not in acute distress.    Appearance: Normal appearance.  HENT:     Head: Normocephalic and atraumatic.  Eyes:     General: No scleral icterus.    Conjunctiva/sclera: Conjunctivae normal.  Cardiovascular:     Rate and Rhythm: Normal rate.     Pulses: Normal pulses.  Pulmonary:     Effort: Pulmonary effort is normal.     Breath sounds: Normal breath sounds.  Abdominal:     General: There is distension.     Tenderness: There is abdominal tenderness. There is no guarding or rebound.  Musculoskeletal:        General: Normal range of motion.  Skin:    General: Skin is warm.     Capillary Refill: Capillary refill takes less than 2 seconds.     Coloration: Skin is not jaundiced or pale.  Neurological:     Mental Status: She is alert. Mental status is at baseline.     ED Results / Procedures / Treatments   Labs (all labs ordered are listed, but only abnormal results are displayed) Labs Reviewed  BASIC METABOLIC PANEL - Abnormal; Notable for the following components:      Result Value   Sodium 125 (*)    Potassium 6.3 (*)    Chloride 95 (*)    CO2 21 (*)    Glucose, Bld 223 (*)    BUN 52 (*)    Creatinine, Ser 5.57 (*)    Calcium 7.3 (*)    GFR, Estimated 10 (*)    All other components within normal limits  CBC WITH DIFFERENTIAL/PLATELET - Abnormal; Notable for the following components:   RBC 3.72 (*)    Hemoglobin 10.4 (*)    HCT 34.9 (*)    MCHC 29.8 (*)    All other components within normal limits  POTASSIUM - Abnormal; Notable for the following components:   Potassium 6.0 (*)    All other components within normal limits  CBG MONITORING, ED - Abnormal; Notable for the following components:   Glucose-Capillary  114 (*)    All other components within normal limits  CBG MONITORING, ED - Abnormal; Notable for the following components:   Glucose-Capillary 33 (*)    All other components within normal limits  CBG MONITORING, ED - Abnormal; Notable for the following components:   Glucose-Capillary 137 (*)    All other components within normal limits  CBG MONITORING, ED - Abnormal; Notable for the following components:   Glucose-Capillary 60 (*)    All other components within normal limits  GASTROINTESTINAL PANEL BY PCR, STOOL (REPLACES STOOL CULTURE)  URINALYSIS, ROUTINE W REFLEX MICROSCOPIC  BASIC METABOLIC PANEL  HEPATITIS B SURFACE ANTIGEN  HEPATITIS B SURFACE ANTIBODY, QUANTITATIVE  CBG MONITORING, ED    EKG EKG Interpretation Date/Time:  Wednesday February 02 2023 12:21:41 EDT Ventricular Rate:  89 PR Interval:  150 QRS Duration:  102 QT Interval:  401 QTC Calculation: 488 R Axis:   137  Text Interpretation: Atrial fibrillation Right axis deviation Borderline prolonged QT interval Confirmed by Eber Hong ([id number]) on 02/02/2023 1:17:21 PM  Radiology No results found.  Procedures Procedures    Medications Ordered in ED Medications  sodium chloride flush (NS) 0.9 % injection 3 mL (3 mLs Intravenous Given 02/02/23 1103)  sodium chloride flush (NS) 0.9 % injection 3 mL (has no administration in time range)  0.9 %  sodium chloride infusion (has no administration in time range)  heparin injection 5,000 Units (5,000 Units Subcutaneous Patient Refused/Not Given 02/02/23 1457)  Chlorhexidine Gluconate Cloth 2 % PADS 6 each (has no administration in time range)  dextrose 50 % solution (  Given 02/02/23 1148)  dextrose 5 % solution ( Intravenous New Bag/Given 02/02/23 1343)    ED Course/ Medical Decision Making/ A&P                                 Medical Decision Making Amount and/or Complexity of Data Reviewed Labs: ordered.  Risk Prescription drug management.   Moon is a  32 year old female presents to the emergency department for evaluation of low blood sugar.  See HPI for further details  Upon evaluation, patient is resting in bed complaining of abdominal pain and distention. She reports that pain and distention typically resolves with dialysis. When questioned regarding compliance with dialysis and insulin schedule, she answers questions differently with different healthcare providers and not necessarily forthcoming with information.  Initial CBL is 114 but decreased to 33. D50 amp given improving CBL to 137. Encouraged patient to eat solid food to maintain CBL. Labwork significant for hyperkalemia (6.3)  Followed up with D5 infusion. Nephrology consulted for emergent dialysis due to fluid overload and hyperkalemia. Dr. Hyacinth Meeker also personally assessed patient and agrees with treatment/dispo plan. Hospitalist consulted for admission due to no clear etiology for repeated hypoglycemia episodes. Disposition patient to be admitted. Dr. Hyacinth Meeker consulted nephrology and hospitalist for admission who agreed.         Final Clinical Impression(s) / ED Diagnoses Final diagnoses:  Hypoglycemia  Hyperkalemia    Rx / DC Orders ED Discharge Orders     None         Judithann Sheen, PA 02/02/23 1535    Eber Hong, MD 02/03/23 [phone]

## 2023-02-02 NOTE — ED Provider Notes (Signed)
.  Critical Care  Performed by: Eber Hong, MD Authorized by: Eber Hong, MD   Critical care provider statement:    Critical care time (minutes):  45   Critical care time was exclusive of:  Separately billable procedures and treating other patients and teaching time   Critical care was necessary to treat or prevent imminent or life-threatening deterioration of the following conditions:  Endocrine crisis and renal failure   Critical care was time spent personally by me on the following activities:  Development of treatment plan with patient or surrogate, discussions with consultants, evaluation of patient's response to treatment, examination of patient, obtaining history from patient or surrogate, review of old charts, re-evaluation of patient's condition, pulse oximetry, ordering and review of radiographic studies, ordering and review of laboratory studies and ordering and performing treatments and interventions   I assumed direction of critical care for this patient from another provider in my specialty: no     Care discussed with: admitting provider   Comments:        This patient is a 32 year old female presenting with a critical illness of recurrent hypoglycemia.  This became quite severe and she dropped down to 33 even after being given dextrose prehospital.  She required D50 then dropped again down to 60 and became symptomatic.  She has had recurrent diarrhea, she is hyperkalemic, I do not see any EKG findings on the EKG that would require emergent dialysis nevertheless the patient will need to be admitted to a higher level of care for her recurrent hypoglycemia on a D5 drip at this time.  I discussed the care with Dr. Arlean Hopping, the nephrology service who has been kind enough to involved in the care of this patient.  I also discussed this with the internal medicine teaching resident who will come to see the patient for admission.  Final diagnoses:  Hypoglycemia  Hyperkalemia       Eber Hong, MD 02/03/23 [phone]

## 2023-02-02 NOTE — ED Notes (Signed)
ED TO INPATIENT HANDOFF REPORT  ED Nurse Name and Phone #: Gustavo Lah, rn [phone]  S Name/Age/Gender Ann Robertson 32 y.o. female Room/Bed: 019C/019C  Code Status   Code Status: Full Code  Home/SNF/Other Home Patient oriented to: self, place, time, and situation Is this baseline? Yes   Triage Complete: Triage complete  Chief Complaint Hypoglycemia associated with diabetes (HCC) [E11.649]  Triage Note Pt arrives via ems for home for the c/o low blood sugar, on arrival pt bs was 41. Ems proceeded to give D10, new bs 161. Apparently father gave unknown amount of insulin. Pt also has noted ascites, hx liver issues, dialysis pt, pt has not went to dialysis in around a week. Pt c/o abd pain 10/10   Allergies Allergies  Allergen Reactions   Hydromorphone Hcl Itching and Rash    Level of Care/Admitting Diagnosis ED Disposition     ED Disposition  Admit   Condition  --   Comment  Hospital Area: [facility] [[id number]]  Level of Care: Progressive [102]  Admit to Progressive based on following criteria: GI, ENDOCRINE disease patients with GI bleeding, acute liver failure or pancreatitis, stable with diabetic ketoacidosis or thyrotoxicosis (hypothyroid) state.  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   Neuropathy    Preeclampsia    STD (sexually transmitted disease)    Past Surgical History:  Procedure Laterality Date   AV FISTULA PLACEMENT Left 06/19/2021   Procedure: INSERTION OF ARTERIOVENOUS (AV)  GORE-TEX GRAFT ARM;  Surgeon: Chuck Hint, MD;  Location: [facility] OR;  Service: Vascular;  Laterality: Left;   CESAREAN SECTION     CESAREAN SECTION N/A 07/05/2013   Procedure: CESAREAN SECTION;  Surgeon: Allie Bossier, MD;  Location: [facility] ORS;  Service: Obstetrics;  Laterality: N/A;   CESAREAN SECTION N/A 07/25/2014   Procedure: CESAREAN SECTION;  Surgeon: Reva Bores, MD;  Location: [facility] ORS;  Service: Obstetrics;  Laterality: N/A;   CESAREAN SECTION N/A 11/21/2015   Procedure: CESAREAN SECTION;  Surgeon: Reva Bores, MD;  Location: [facility]IRTHING SUIT[facility];  Service: Obstetrics;  Laterality: N/A;   IR FLUORO GUIDE CV LINE RIGHT  06/16/2021   IR US GUIDE VASC ACCESS RIGHT  06/16/2021   WISDOM TOOTH EXTRACTION       A IV Location/Drains/Wounds Patient Lines/Drains/Airways Status     Active Line/Drains/Airways     Name Placement date Placement time Site Days   Peripheral IV 02/02/23 22 G Posterior;Right Hand 02/02/23  --  Hand  less than 1   Fistula / Graft Left Upper arm Arteriovenous vein graft 06/19/21  0850  Upper arm  593   Hemodialysis Catheter Right Subclavian Double lumen Permanent (Tunneled) 06/16/21  1611  Subclavian  596   Pressure Injury 06/04/22 Ischial tuberosity Right Deep Tissue Pressure Injury - Purple or maroon localized area of discolored intact skin or blood-filled blister due to damage of underlying soft tissue from pressure and/or shear. 06/04/22  1340  -- 243  Intake/Output Last 24 hours No intake or output data in the 24 hours ending 02/02/23 1502  Labs/Imaging Results for orders placed or performed during the hospital encounter of 02/02/23 (from the past 48 hour(s))  CBG monitoring, ED     Status: Abnormal   Collection Time: 02/02/23 10:40 AM  Result Value Ref Range   Glucose-Capillary 114 (H) 70 - 99 mg/dL    Comment: Glucose reference range applies only to samples taken after fasting for at least 8 hours.  Basic metabolic panel     Status:  Abnormal   Collection Time: 02/02/23 11:04 AM  Result Value Ref Range   Sodium 125 (L) 135 - 145 mmol/L   Potassium 6.3 (HH) 3.5 - 5.1 mmol/L    Comment: HEMOLYSIS AT THIS LEVEL MAY AFFECT RESULT CRITICAL RESULT CALLED TO, READ BACK BY AND VERIFIED WITH Gustavo Lah RN [phone] [id number] M.ALAMANO    Chloride 95 (L) 98 - 111 mmol/L   CO2 21 (L) 22 - 32 mmol/L   Glucose, Bld 223 (H) 70 - 99 mg/dL    Comment: Glucose reference range applies only to samples taken after fasting for at least 8 hours.   BUN 52 (H) 6 - 20 mg/dL   Creatinine, Ser 8.29 (H) 0.44 - 1.00 mg/dL   Calcium 7.3 (L) 8.9 - 10.3 mg/dL   GFR, Estimated 10 (L) >60 mL/min    Comment: (NOTE) Calculated using the CKD-EPI Creatinine Equation (2021)    Anion gap 9 5 - 15    Comment: Performed at [facility], [address] [place], [place] [address]  CBC with Differential     Status: Abnormal   Collection Time: 02/02/23 11:04 AM  Result Value Ref Range   WBC 4.7 4.0 - 10.5 K/uL   RBC 3.72 (L) 3.87 - 5.11 MIL/uL   Hemoglobin 10.4 (L) 12.0 - 15.0 g/dL   HCT 08.6 (L) 57.8 - 46.9 %   MCV 93.8 80.0 - 100.0 fL   MCH 28.0 26.0 - 34.0 pg   MCHC 29.8 (L) 30.0 - 36.0 g/dL   RDW 62.9 52.8 - 41.3 %   Platelets 163 150 - 400 K/uL   nRBC 0.0 0.0 - 0.2 %   Neutrophils Relative % 68 %   Neutro Abs 3.2 1.7 - 7.7 K/uL   Lymphocytes Relative 22 %   Lymphs Abs 1.0 0.7 - 4.0 K/uL   Monocytes Relative 6 %   Monocytes Absolute 0.3 0.1 - 1.0 K/uL   Eosinophils Relative 2 %   Eosinophils Absolute 0.1 0.0 - 0.5 K/uL   Basophils Relative 1 %   Basophils Absolute 0.0 0.0 - 0.1 K/uL   Immature Granulocytes 1 %   Abs Immature Granulocytes 0.03 0.00 - 0.07 K/uL    Comment: Performed at [facility], [address] [place], [place] [address]  CBG monitoring, ED (now and then every hour for 3 hours)     Status: Abnormal   Collection Time: 02/02/23 11:45 AM  Result Value Ref Range   Glucose-Capillary 33 (LL) 70 - 99 mg/dL    Comment:  Glucose reference range applies only to samples taken after fasting for at least 8 hours.   Comment 1 Call MD NNP PA CNM   CBG monitoring, ED (now and then every hour for 3 hours)     Status: Abnormal   Collection Time: 02/02/23 12:03 PM  Result Value Ref Range   Glucose-Capillary 137 (H) 70 - 99 mg/dL  Comment: Glucose reference range applies only to samples taken after fasting for at least 8 hours.  Potassium     Status: Abnormal   Collection Time: 02/02/23 12:44 PM  Result Value Ref Range   Potassium 6.0 (H) 3.5 - 5.1 mmol/L    Comment: HEMOLYSIS AT THIS LEVEL MAY AFFECT RESULT Performed at [facility], [address], [place], [place] [address]   CBG monitoring, ED (now and then every hour for 3 hours)     Status: Abnormal   Collection Time: 02/02/23  1:10 PM  Result Value Ref Range   Glucose-Capillary 60 (L) 70 - 99 mg/dL    Comment: Glucose reference range applies only to samples taken after fasting for at least 8 hours.   No results found.  Pending Labs Unresulted Labs (From admission, onward)     Start     Ordered   02/02/23 1700  Basic metabolic panel  Once,   R        02/02/23 1415   02/02/23 1414  Gastrointestinal Panel by PCR , Stool  (Gastrointestinal Panel by PCR, Stool                                                                                                                                                     **Does Not include CLOSTRIDIUM DIFFICILE testing. **If CDIFF testing is needed, place order from the "C Difficile Testing" order set.**)  Once,   R        02/02/23 1414   02/02/23 1318  Urinalysis, Routine w reflex microscopic -Urine, Clean Catch  Once,   URGENT       Question:  Specimen Source  Answer:  Urine, Clean Catch   02/02/23 1318            Vitals/Pain Today's Vitals   02/02/23 1030 02/02/23 1033 02/02/23 1119 02/02/23 1345  BP:  (!) 147/113 (!) 180/121 (!) 172/110  Pulse:  66  85  Resp:  15  15  Temp:  (!) 97.5 F (36.4 C)     TempSrc:  Axillary    SpO2:  95%  98%  Height: 5\' 5"  (1.651 m)     PainSc: 10-Worst pain ever       Isolation Precautions Enteric precautions (UV disinfection)  Medications Medications  sodium chloride flush (NS) 0.9 % injection 3 mL (3 mLs Intravenous Given 02/02/23 1103)  sodium chloride flush (NS) 0.9 % injection 3 mL (has no administration in time range)  0.9 %  sodium chloride infusion (has no administration in time range)  heparin injection 5,000 Units (5,000 Units Subcutaneous Patient Refused/Not Given 02/02/23 1457)  dextrose 50 % solution (  Given 02/02/23 1148)  dextrose 5 % solution ( Intravenous New Bag/Given 02/02/23 1343)    Mobility walks with person assist     Focused  Assessments Renal Assessment Handoff:  Hemodialysis Schedule: Hemodialysis Schedule: Tuesday/Thursday/Saturday Last Hemodialysis date and time: last week sometime    Restricted appendage: left arm   R Recommendations: See Admitting Provider Note  Report given to:   Additional Notes:

## 2023-02-02 NOTE — Inpatient Diabetes Management (Signed)
Inpatient Diabetes Program Recommendations  AACE/ADA: New Consensus Statement on Inpatient Glycemic Control (2015)  Target Ranges:  Prepandial:   less than 140 mg/dL      Peak postprandial:   less than 180 mg/dL (1-2 hours)      Critically ill patients:  140 - 180 mg/dL   Lab Results  Component Value Date   GLUCAP 60 (L) 02/02/2023   HGBA1C 14.3 (H) 02/08/2022    Review of Glycemic Control  Latest Reference Range & Units 02/02/23 10:40 02/02/23 11:45 02/02/23 12:03 02/02/23 13:10  Glucose-Capillary 70 - 99 mg/dL 161 (H) 33 (LL) 096 (H) 60 (L)   Diabetes history: Type 1 DM/ESRD Outpatient Diabetes medications:  Humalog 1-3 units tid with meals Lantus 7 units daily (per last order from Endocrinology, she was ordered Lantus 3 units daily) Current orders for Inpatient glycemic control:  None  Inpatient Diabetes Program Recommendations:    Note history of DM 1/ESRD.  Last documented visit with endocrinology was 07/31/2022.  At this visit CGM was restarted and insulin pump was discussed.   Unclear what precipitated low blood sugar this morning or whether patient is wearing sensor? Agree with addition of Dextrose and close monitoring. Of note, it appears patient has missed several dialysis visits as well.   Thanks,  Beryl Meager, RN, BC-ADM Inpatient Diabetes Coordinator Pager [phone]  (8a-5p)

## 2023-02-02 NOTE — Consult Note (Signed)
[facility] Renal Consultation Note    Indication for Consultation:  Management of ESRD/hemodialysis PCP: Felix Pacini, FNP   HPI: Ann Robertson is a 32 y.o. female with a history of ESRD and T1DM who presents to the ED with complaints of symptomatic low blood sugar. She is an established CKA & IHD patient at [facility] and undergoes treatments MWF. Last dialysis per pt. was Monday (9/2).  She endorses low blood glucose levels, chills, and fatigue since Friday as well as diarrhea that has persisted >1 year. She was prompted to present to the ED when her symptoms became worse following insulin administration by her father today, which she states was the wrong dose. She denies nausea, vomiting, shortness of breath, or chest pain.   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   Neuropathy    Preeclampsia    STD (sexually transmitted disease)    Past Surgical History:  Procedure Laterality Date   AV FISTULA PLACEMENT Left 06/19/2021   Procedure: INSERTION OF ARTERIOVENOUS (AV) GORE-TEX GRAFT ARM;  Surgeon: Chuck Hint, MD;  Location: [facility] OR;  Service: Vascular;  Laterality: Left;   CESAREAN SECTION     CESAREAN SECTION N/A 07/05/2013   Procedure: CESAREAN SECTION;  Surgeon: Allie Bossier, MD;  Location: [facility] ORS;  Service: Obstetrics;  Laterality: N/A;   CESAREAN SECTION N/A 07/25/2014   Procedure: CESAREAN SECTION;  Surgeon: Reva Bores, MD;  Location: [facility] ORS;  Service: Obstetrics;  Laterality: N/A;   CESAREAN SECTION N/A 11/21/2015   Procedure: CESAREAN SECTION;  Surgeon: Reva Bores, MD;  Location: [facility] BIRTHING SUITES;  Service: Obstetrics;  Laterality: N/A;   IR FLUORO GUIDE CV LINE RIGHT  06/16/2021   IR US GUIDE VASC ACCESS RIGHT  06/16/2021   WISDOM TOOTH EXTRACTION     Family History  Problem Relation Age of Onset   Stroke Mother    Hypertension Mother    Heart disease Mother     Kidney disease Mother    Hyperlipidemia Father    Diabetes Father    Social History: Patient denies alcohol use. Endorses smoking half-pack of cigarettes per day since Jan 2024.    ROS: As per HPI otherwise negative.  Physical Exam: Vitals:   02/02/23 1030 02/02/23 1033 02/02/23 1119 02/02/23 1345  BP:  (!) 147/113 (!) 180/121 (!) 172/110  Pulse:  66  85  Resp:  15  15  Temp:  (!) 97.5 F (36.4 C)    TempSrc:  Axillary    SpO2:  95%  98%  Height: 5\' 5"  (1.651 m)        General: Thin appearing female with visible tremors, wrapped in several blankets on hospital bed.  Head: Normocephalic, atraumatic, sclera non-icteric, mucus membranes are dry. Neck: Supple without lymphadenopathy/masses. JVD not elevated. Lungs: Clear bilaterally to auscultation without wheezes, rales, or rhonchi. Breathing is unlabored. Heart: RRR with normal S1, S2. No murmurs, rubs, or gallops appreciated. Abdomen: Tender with mild distension and hyperactive bowel sounds.  Musculoskeletal:  Strength and tone appear normal for age. Lower extremities: No edema or ischemic changes, no open wounds. Neuro: Alert and oriented X 3. Moves all extremities spontaneously. Psych:  Responds to questions appropriately with a normal affect. Dialysis Access: LA Graft present in good repair w/ + bruit/thrill.   Allergies  Allergen Reactions   Hydromorphone Hcl Itching and Rash   Prior to Admission  medications   Medication Sig Start Date End Date Taking? Authorizing Provider  gabapentin (NEURONTIN) 100 MG capsule Take 100 mg by mouth daily. 02/15/22  Yes [provider]  labetalol (NORMODYNE) 100 MG tablet Take 100 mg by mouth 2 (two) times daily. 03/26/21  Yes [provider]  loperamide (IMODIUM) 2 MG capsule Please take one (2mg ) tablet up to four times a day for loose stools, until you have 1-2 bowel movements. Please continue this dose. If you stop having bowel movements, please stop using this  medication. 02/24/22  Yes Marolyn Haller, MD  omeprazole (PRILOSEC) 20 MG capsule Take 20 mg by mouth daily as needed (acid reflux/heartburn).   Yes [provider]  Glucagon (BAQSIMI ONE PACK) 3 MG/DOSE POWD Place 1 each into the nose as needed. 02/24/22   Marolyn Haller, MD  Glucose 15 GM/32ML GEL Please use gel for low blood sugars per instructions with packaging 02/24/22   Marolyn Haller, MD  hydrOXYzine (ATARAX) 25 MG tablet Take 1 tablet (25 mg total) by mouth 2 (two) times daily as needed for itching. 02/24/22   Marolyn Haller, MD  insulin glargine (LANTUS SOLOSTAR) 100 UNIT/ML Solostar Pen Inject 3 Units into the skin at bedtime. Patient taking differently: Inject 7 Units into the skin daily. 02/24/22   Marolyn Haller, MD  insulin lispro (HUMALOG) 100 UNIT/ML KwikPen Inject 1-3 Units into the skin See admin instructions. Inject 1-3 units up to three times daily with meals (only if eating) per sliding scale based on blood glucose and Carb count. 02/24/22   Marolyn Haller, MD  lidocaine-prilocaine (EMLA) cream Apply 1 Application topically as needed (For port access). 06/09/22   Joseph Art, DO  methocarbamol (ROBAXIN) 750 MG tablet Take 750 mg by mouth every 8 (eight) hours as needed for muscle spasms. 02/16/22   [provider]  oseltamivir (TAMIFLU) 30 MG capsule Take 1 capsule (30 mg total) by mouth every Monday, Wednesday, and Friday at 6 PM. 06/09/22   Joseph Art, DO  oxyCODONE-acetaminophen (PERCOCET) 10-325 MG tablet Take 1 tablet by mouth every 4 (four) hours as needed for pain. 02/13/21   [provider]  phenylephrine-shark liver oil-mineral oil-petrolatum (PREPARATION H) 0.25-14-74.9 % rectal ointment Place 1 Application rectally 2 (two) times daily as needed for hemorrhoids. 02/23/22   Marolyn Haller, MD  rosuvastatin (CRESTOR) 20 MG tablet Take 20 mg by mouth at bedtime. 03/09/22   [provider]   Current  Facility-Administered Medications  Medication Dose Route Frequency Provider Last Rate Last Admin   0.9 %  sodium chloride infusion  250 mL Intravenous PRN Eber Hong, MD       heparin injection 5,000 Units  5,000 Units Subcutaneous Q8H Marrianne Mood, MD       sodium chloride flush (NS) 0.9 % injection 3 mL  3 mL Intravenous Q12H Eber Hong, MD   3 mL at 02/02/23 1103   sodium chloride flush (NS) 0.9 % injection 3 mL  3 mL Intravenous PRN Eber Hong, MD       Current Outpatient Medications  Medication Sig Dispense Refill   gabapentin (NEURONTIN) 100 MG capsule Take 100 mg by mouth daily.     labetalol (NORMODYNE) 100 MG tablet Take 100 mg by mouth 2 (two) times daily.     loperamide (IMODIUM) 2 MG capsule Please take one (2mg ) tablet up to four times a day for loose stools, until you have 1-2 bowel movements. Please continue this dose. If you stop having  bowel movements, please stop using this medication. 90 capsule 0   omeprazole (PRILOSEC) 20 MG capsule Take 20 mg by mouth daily as needed (acid reflux/heartburn).     Glucagon (BAQSIMI ONE PACK) 3 MG/DOSE POWD Place 1 each into the nose as needed. 1 each 0   Glucose 15 GM/32ML GEL Please use gel for low blood sugars per instructions with packaging 32 mL 0   hydrOXYzine (ATARAX) 25 MG tablet Take 1 tablet (25 mg total) by mouth 2 (two) times daily as needed for itching. 30 tablet 0   insulin glargine (LANTUS SOLOSTAR) 100 UNIT/ML Solostar Pen Inject 3 Units into the skin at bedtime. (Patient taking differently: Inject 7 Units into the skin daily.) 15 mL 11   insulin lispro (HUMALOG) 100 UNIT/ML KwikPen Inject 1-3 Units into the skin See admin instructions. Inject 1-3 units up to three times daily with meals (only if eating) per sliding scale based on blood glucose and Carb count. 15 mL 11   lidocaine-prilocaine (EMLA) cream Apply 1 Application topically as needed (For port access). 30 g 0   methocarbamol (ROBAXIN) 750 MG tablet Take  750 mg by mouth every 8 (eight) hours as needed for muscle spasms.     oseltamivir (TAMIFLU) 30 MG capsule Take 1 capsule (30 mg total) by mouth every Monday, Wednesday, and Friday at 6 PM. 1 capsule 0   oxyCODONE-acetaminophen (PERCOCET) 10-325 MG tablet Take 1 tablet by mouth every 4 (four) hours as needed for pain.     phenylephrine-shark liver oil-mineral oil-petrolatum (PREPARATION H) 0.25-14-74.9 % rectal ointment Place 1 Application rectally 2 (two) times daily as needed for hemorrhoids. 28 g 0   rosuvastatin (CRESTOR) 20 MG tablet Take 20 mg by mouth at bedtime.     Labs: Basic Metabolic Panel: Recent Labs  Lab 02/02/23 1104 02/02/23 1244  NA 125*  --   K 6.3* 6.0*  CL 95*  --   CO2 21*  --   GLUCOSE 223*  --   BUN 52*  --   CREATININE 5.57*  --   CALCIUM 7.3*  --    CBC: Recent Labs  Lab 02/02/23 1104  WBC 4.7  NEUTROABS 3.2  HGB 10.4*  HCT 34.9*  MCV 93.8  PLT 163   CBG: Recent Labs  Lab 02/02/23 1040 02/02/23 1145 02/02/23 1203 02/02/23 1310  GLUCAP 114* 33* 137* 60*   Studies/Results: No results found.  Dialysis Orders: Center: [facility]  on MWF Time: 4 hours EDW: 45 per pt.  Access: LUA Graft placed in 2023 Started HD @ [facility] in 2023.   Assessment/Plan: ESRD: Patient appears somewhat hypovolemic. Will place dialysis order for tonight once she is admitted.  She denies any issues with dialysis or her access. She does use EMLA cream for access site anesthesia. Consider isovolemic HD tx or minimal UF pull due to fluid loss from diarrhea. Dialysis will likely correct potassium and lessen BUN/SCr. Follow renal diet, trend renal panel, and monitor volume status over the course of hospitalization. Monitor blood glucose while undergoing and after HD treatments. Avoid venipuncture or BP measurement on arm of access site. See HD orders placed by Dr. Arlean Hopping.  T1DM w/ Hypoglycemia: Dextrose administered in ED along with PO fluids & food. Blood Glucose significantly  improved. She suspects she was given too much insulin which prompted her symptoms to worsen. We appreciate the IM team's management of her insulin regimen.  Hyponatremia: I suspect this is due to extra-renal losses (diarrhea). Encourage fluid and  food intake as appropriate. Continue to trend renal panel and monitor for symptomatic changes (headache, mental status changes).  Chronic Diarrhea: Pt complains of diarrhea which has lasted approx. 1 year. I suspect she would benefit from a GI consult, as getting her diarrhea to stop would improve her quality of life and volume status. Her pain and loose stools concern me for an inflammatory process, but will defer to IM team.   Santiago Bumpers, PA-S2 02/02/2023, 2:33 PM  [facility]

## 2023-02-03 ENCOUNTER — Encounter (HOSPITAL_COMMUNITY): Payer: Self-pay

## 2023-02-03 ENCOUNTER — Other Ambulatory Visit (HOSPITAL_COMMUNITY): Payer: Self-pay

## 2023-02-03 DIAGNOSIS — E871 Hypo-osmolality and hyponatremia: Secondary | ICD-10-CM | POA: Diagnosis not present

## 2023-02-03 DIAGNOSIS — E1122 Type 2 diabetes mellitus with diabetic chronic kidney disease: Secondary | ICD-10-CM

## 2023-02-03 DIAGNOSIS — R197 Diarrhea, unspecified: Secondary | ICD-10-CM | POA: Diagnosis not present

## 2023-02-03 DIAGNOSIS — Z992 Dependence on renal dialysis: Secondary | ICD-10-CM | POA: Diagnosis not present

## 2023-02-03 DIAGNOSIS — E10649 Type 1 diabetes mellitus with hypoglycemia without coma: Secondary | ICD-10-CM | POA: Diagnosis not present

## 2023-02-03 DIAGNOSIS — N186 End stage renal disease: Secondary | ICD-10-CM

## 2023-02-03 DIAGNOSIS — Z794 Long term (current) use of insulin: Secondary | ICD-10-CM | POA: Diagnosis not present

## 2023-02-03 DIAGNOSIS — E11649 Type 2 diabetes mellitus with hypoglycemia without coma: Secondary | ICD-10-CM

## 2023-02-03 LAB — GASTROINTESTINAL PANEL BY PCR, STOOL (REPLACES STOOL CULTURE)

## 2023-02-03 LAB — COMPREHENSIVE METABOLIC PANEL
ALT: 12 U/L (ref 0–44)
AST: 25 U/L (ref 15–41)
Albumin: 2.7 g/dL — ABNORMAL LOW (ref 3.5–5.0)
Alkaline Phosphatase: 205 U/L — ABNORMAL HIGH (ref 38–126)
Anion gap: 11 (ref 5–15)
BUN: 35 mg/dL — ABNORMAL HIGH (ref 6–20)
CO2: 27 mmol/L (ref 22–32)
Calcium: 8.2 mg/dL — ABNORMAL LOW (ref 8.9–10.3)
Chloride: 93 mmol/L — ABNORMAL LOW (ref 98–111)
Creatinine, Ser: 4.55 mg/dL — ABNORMAL HIGH (ref 0.44–1.00)
GFR, Estimated: 12 mL/min — ABNORMAL LOW (ref >60)
Glucose, Bld: 134 mg/dL — ABNORMAL HIGH (ref 70–99)
Potassium: 4.4 mmol/L (ref 3.5–5.1)
Sodium: 131 mmol/L — ABNORMAL LOW (ref 135–145)
Total Bilirubin: 0.9 mg/dL (ref 0.3–1.2)
Total Protein: 7.1 g/dL (ref 6.5–8.1)

## 2023-02-03 LAB — CBC
HCT: 34 % — ABNORMAL LOW (ref 36.0–46.0)
Hemoglobin: 10.5 g/dL — ABNORMAL LOW (ref 12.0–15.0)
MCH: 27.8 pg (ref 26.0–34.0)
MCHC: 30.9 g/dL (ref 30.0–36.0)
MCV: 89.9 fL (ref 80.0–100.0)
Platelets: 201 10*3/uL (ref 150–400)
RBC: 3.78 MIL/uL — ABNORMAL LOW (ref 3.87–5.11)
RDW: 14.6 % (ref 11.5–15.5)
WBC: 5 10*3/uL (ref 4.0–10.5)
nRBC: 0 % (ref 0.0–0.2)

## 2023-02-03 LAB — GLUCOSE, CAPILLARY
Glucose-Capillary: 85 mg/dL (ref 70–99)
Glucose-Capillary: 119 mg/dL — ABNORMAL HIGH (ref 70–99)
Glucose-Capillary: 97 mg/dL (ref 70–99)
Glucose-Capillary: 131 mg/dL — ABNORMAL HIGH (ref 70–99)
Glucose-Capillary: 298 mg/dL — ABNORMAL HIGH (ref 70–99)
Glucose-Capillary: 355 mg/dL — ABNORMAL HIGH (ref 70–99)

## 2023-02-03 LAB — PHOSPHORUS: Phosphorus: 4 mg/dL (ref 2.5–4.6)

## 2023-02-03 LAB — MAGNESIUM: Magnesium: 1.7 mg/dL (ref 1.7–2.4)

## 2023-02-03 LAB — HEPATITIS B SURFACE ANTIBODY, QUANTITATIVE: Hep B S AB Quant (Post): <3.5 m[IU]/mL — ABNORMAL LOW

## 2023-02-03 MED ORDER — TROLAMINE SALICYLATE 10 % EX CREA: TOPICAL_CREAM | CUTANEOUS | Status: DC | PRN

## 2023-02-03 MED ORDER — LABETALOL HCL 100 MG PO TABS
100.0000 mg | ORAL_TABLET | Freq: Two times a day (BID) | ORAL | 0 refills | Status: DC
  Filled 2023-02-03: qty 60, 30d supply, fill #0

## 2023-02-03 MED ORDER — BLOOD GLUCOSE TEST VI STRP
1.0000 | ORAL_STRIP | Freq: Three times a day (TID) | 0 refills | Status: DC
  Filled 2023-02-03: qty 100, 34d supply, fill #0

## 2023-02-03 MED ORDER — NUTRISOURCE FIBER PO PACK
1.0000 | PACK | Freq: Two times a day (BID) | ORAL | Status: DC
  Administered 2023-02-03 (×2): 1 via ORAL
  Filled 2023-02-03 (×3): qty 1

## 2023-02-03 MED ORDER — ACIDOPHILUS LACTOBACILLUS PO CAPS
1.0000 | ORAL_CAPSULE | Freq: Three times a day (TID) | ORAL | 0 refills | Status: DC
  Filled 2023-02-03: qty 100, 30d supply, fill #0

## 2023-02-03 MED ORDER — ROSUVASTATIN CALCIUM 10 MG PO TABS
10.0000 mg | ORAL_TABLET | Freq: Every day | ORAL | 0 refills | Status: DC
  Filled 2023-02-03: qty 30, 30d supply, fill #0

## 2023-02-03 MED ORDER — LOPERAMIDE HCL 2 MG PO CAPS
4.0000 mg | ORAL_CAPSULE | ORAL | Status: DC | PRN
  Administered 2023-02-03: 4 mg via ORAL
  Filled 2023-02-03: qty 2

## 2023-02-03 MED ORDER — ACCU-CHEK SOFTCLIX LANCETS MISC
1.0000 | Freq: Three times a day (TID) | 0 refills | Status: AC
  Filled 2023-02-03: qty 100, 30d supply, fill #0

## 2023-02-03 MED ORDER — OXYCODONE-ACETAMINOPHEN 5-325 MG PO TABS
2.0000 | ORAL_TABLET | Freq: Four times a day (QID) | ORAL | Status: DC | PRN
  Administered 2023-02-03 (×2): 2 via ORAL
  Filled 2023-02-03 (×2): qty 2

## 2023-02-03 MED ORDER — BAQSIMI ONE PACK 3 MG/DOSE NA POWD
1.0000 | NASAL | 5 refills | Status: DC | PRN
  Filled 2023-02-03: qty 2, 30d supply, fill #0

## 2023-02-03 MED ORDER — GABAPENTIN 100 MG PO CAPS
100.0000 mg | ORAL_CAPSULE | Freq: Three times a day (TID) | ORAL | 0 refills | Status: DC | PRN
  Filled 2023-02-03: qty 90, 30d supply, fill #0

## 2023-02-03 MED ORDER — INSULIN ASPART 100 UNIT/ML IJ SOLN
0.0000 [IU] | Freq: Three times a day (TID) | INTRAMUSCULAR | 11 refills | Status: DC
  Filled 2023-02-03: qty 10, 56d supply, fill #0

## 2023-02-03 MED ORDER — INSULIN ASPART 100 UNIT/ML IJ SOLN
0.0000 [IU] | Freq: Three times a day (TID) | INTRAMUSCULAR | Status: DC
  Administered 2023-02-03: 5 [IU] via SUBCUTANEOUS

## 2023-02-03 MED ORDER — LANCET DEVICE MISC
1.0000 | Freq: Three times a day (TID) | 0 refills | Status: AC
Start: 2023-02-03 — End: 2023-03-05
  Filled 2023-02-03: qty 1, 30d supply, fill #0

## 2023-02-03 MED ORDER — METHOCARBAMOL 750 MG PO TABS
750.0000 mg | ORAL_TABLET | Freq: Three times a day (TID) | ORAL | 0 refills | Status: DC | PRN
  Filled 2023-02-03: qty 90, 30d supply, fill #0

## 2023-02-03 MED ORDER — NEPRO/CARBSTEADY PO LIQD: 237.0000 mL | Freq: Three times a day (TID) | ORAL | Status: DC

## 2023-02-03 MED ORDER — LIDOCAINE-PRILOCAINE 2.5-2.5 % EX CREA
1.0000 | TOPICAL_CREAM | CUTANEOUS | 0 refills | Status: DC | PRN
Start: 2023-02-03 — End: 2023-07-22
  Filled 2023-02-03: qty 30, fill #0

## 2023-02-03 MED ORDER — CALCIUM POLYCARBOPHIL 625 MG PO TABS
625.0000 mg | ORAL_TABLET | Freq: Two times a day (BID) | ORAL | 0 refills | Status: DC
  Filled 2023-02-03: qty 60, 30d supply, fill #0

## 2023-02-03 MED ORDER — INSULIN LISPRO (1 UNIT DIAL) 100 UNIT/ML (KWIKPEN): 0.0000 [IU] | PEN_INJECTOR | Freq: Three times a day (TID) | SUBCUTANEOUS | Status: DC

## 2023-02-03 MED ORDER — MUSCLE RUB 10-15 % EX CREA
TOPICAL_CREAM | CUTANEOUS | Status: DC | PRN
  Filled 2023-02-03: qty 85

## 2023-02-03 MED ORDER — LOPERAMIDE HCL 2 MG PO CAPS
4.0000 mg | ORAL_CAPSULE | ORAL | 0 refills | Status: DC | PRN
  Filled 2023-02-03: qty 30, 4d supply, fill #0

## 2023-02-03 MED ORDER — ROSUVASTATIN CALCIUM 5 MG PO TABS
10.0000 mg | ORAL_TABLET | Freq: Every day | ORAL | Status: DC
  Administered 2023-02-03: 10 mg via ORAL
  Filled 2023-02-03: qty 2

## 2023-02-03 MED ORDER — OMEPRAZOLE 20 MG PO CPDR
20.0000 mg | DELAYED_RELEASE_CAPSULE | Freq: Every day | ORAL | 0 refills | Status: DC | PRN
  Filled 2023-02-03: qty 30, 30d supply, fill #0

## 2023-02-03 MED ORDER — ACETAMINOPHEN 325 MG PO TABS
650.0000 mg | ORAL_TABLET | Freq: Four times a day (QID) | ORAL | Status: DC | PRN
  Administered 2023-02-03: 650 mg via ORAL
  Filled 2023-02-03: qty 2

## 2023-02-03 MED ORDER — BLOOD GLUCOSE MONITOR SYSTEM W/DEVICE KIT
1.0000 | PACK | Freq: Three times a day (TID) | 0 refills | Status: DC
  Filled 2023-02-03: qty 1, 30d supply, fill #0

## 2023-02-03 MED ORDER — SACCHAROMYCES BOULARDII 250 MG PO CAPS
250.0000 mg | ORAL_CAPSULE | Freq: Two times a day (BID) | ORAL | Status: DC
  Administered 2023-02-03: 250 mg via ORAL
  Filled 2023-02-03: qty 1

## 2023-02-03 NOTE — Progress Notes (Signed)
Pt receives out-pt HD at [facility] on MWF. Pt to d/c today. Contacted clinic to make them aware that pt will d/c today and resume care tomorrow.   Olivia Canter Renal Navigator [phone]

## 2023-02-03 NOTE — Care Management Obs Status (Signed)
MEDICARE OBSERVATION STATUS NOTIFICATION   Patient Details  Name: Ann Robertson MRN: [id number] Date of Birth: Aug 09, 1990   Medicare Observation Status Notification Given:  Yes Permission to sign due to remote    Lockie Pares, RN 02/03/2023, 3:49 PM

## 2023-02-03 NOTE — Care Management (Signed)
Called to give patient Medicare notice, she is currently on [facility]. Will call patient back. She does have transportation home.

## 2023-02-03 NOTE — Inpatient Diabetes Management (Signed)
Inpatient Diabetes Program Recommendations  AACE/ADA: New Consensus Statement on Inpatient Glycemic Control  Target Ranges:  Prepandial:   less than 140 mg/dL      Peak postprandial:   less than 180 mg/dL (1-2 hours)      Critically ill patients:  140 - 180 mg/dL    Latest Reference Range & Units 02/03/23 01:44 02/03/23 04:31 02/03/23 07:02  Glucose-Capillary 70 - 99 mg/dL 85 213 (H) 97    Latest Reference Range & Units 02/02/23 10:40 02/02/23 11:45 02/02/23 12:03 02/02/23 13:10 02/02/23 15:28  Glucose-Capillary 70 - 99 mg/dL 086 (H) 33 (LL) 578 (H) 60 (L) 87   Review of Glycemic Control  Diabetes history: DM1 (does not make any insulin) Outpatient Diabetes medications: Lantus 3 units QHS, Humalog 1-3 units TID  Current orders for Inpatient glycemic control: CBGs  Inpatient Diabetes Program Recommendations:    Insulin: Please consider ordering Novolog 0-6 units Q4H.  NOTE: Patient admitted with hypoglycemia likely from insulin overdose (per H&P, patient's father gave patient Lantus 6 units on 02/02/23). CBGs today 85, 119, and 97 mg/dl.   Thanks, Orlando Penner, RN, MSN, CDCES Diabetes Coordinator Inpatient Diabetes Program [phone] (Team Pager from 8am to 5pm)

## 2023-02-03 NOTE — Plan of Care (Signed)
  Problem: Clinical Measurements: Goal: Will remain free from infection 02/03/2023 0603 by Carolanne Grumbling, RN Outcome: Progressing 02/03/2023 0601 by Carolanne Grumbling, RN Outcome: Progressing Goal: Respiratory complications will improve 02/03/2023 0603 by Carolanne Grumbling, RN Outcome: Progressing 02/03/2023 0601 by Carolanne Grumbling, RN Outcome: Progressing   Problem: Activity: Goal: Risk for activity intolerance will decrease Outcome: Progressing

## 2023-02-03 NOTE — Progress Notes (Signed)
[facility] Progress Note   Subjective:   Patient seen in room, states she is still in pain but denies shortness of breath.  Objective Vitals:   02/02/23 2226 02/03/23 0200 02/03/23 0401 02/03/23 0801  BP: (!) 165/104 (!) 148/96 (!) 155/98 (!) 153/97  Pulse: 85 99 88 92  Resp: 11 14  16   Temp: 97.7 F (36.5 C) 97.8 F (36.6 C) 97.7 F (36.5 C) 98.8 F (37.1 C)  TempSrc: Oral Oral Oral Oral  SpO2: 100% 100% 100% 96%  Weight:   50.6 kg   Height:       Physical Exam General: Cachectic appearing female lying supine in hospital bed.  Heart:RRR no m/r/g Lungs: CTABL Abdomen: Distended and tender to palpation.  Extremities: No peripheral edema.  Dialysis Access: LUA Graft  Additional Objective Labs: Basic Metabolic Panel: Recent Labs  Lab 02/02/23 1104 02/02/23 1244 02/02/23 1623  NA 125*  --  122*  K 6.3* 6.0* 6.1*  CL 95*  --  88*  CO2 21*  --  22  GLUCOSE 223*  --  151*  BUN 52*  --  57*  CREATININE 5.57*  --  6.09*  CALCIUM 7.3*  --  7.9*   CBC: Recent Labs  Lab 02/02/23 1104 02/03/23 1014  WBC 4.7 5.0  NEUTROABS 3.2  --   HGB 10.4* 10.5*  HCT 34.9* 34.0*  MCV 93.8 89.9  PLT 163 201   Blood Culture    Component Value Date/Time   SDES  04/19/2021 1024    BLOOD BLOOD RIGHT HAND Performed at [facility], [address] [place], [place] [address]    SDES  04/19/2021 1024    BLOOD BLOOD RIGHT WRIST Performed at [facility], [address] [place], [place] [address]    SPECREQUEST  04/19/2021 1024    BOTTLES DRAWN AEROBIC ONLY Blood Culture adequate volume Performed at [facility], [address], [place], [place] [address]    SPECREQUEST  04/19/2021 1024    BOTTLES DRAWN AEROBIC ONLY Blood Culture adequate volume Performed at [facility], [address], [place], [place] [address]    CULT  04/19/2021 1024    NO GROWTH 5 DAYS Performed at [facility], [address] [place], [place] [address]    CULT  04/19/2021 1024    NO GROWTH 5 DAYS Performed at [facility], [address], [place], [place] [address]    REPTSTATUS 04/24/2021 FINAL 04/19/2021 1024   REPTSTATUS 04/24/2021 FINAL 04/19/2021 1024    CBG: Recent Labs  Lab 02/03/23 0144 02/03/23 0431 02/03/23 0702 02/03/23 0849 02/03/23 1203  GLUCAP 85 119* 97 131* 298*    Studies/Results: [place] ASCITES (ABDOMEN LIMITED)  Result Date: 02/02/2023 CLINICAL DATA:  Ascites EXAM: LIMITED ABDOMEN ULTRASOUND FOR ASCITES TECHNIQUE: Limited ultrasound survey for ascites was performed in all four abdominal quadrants. COMPARISON:  None Available. FINDINGS: Scattered diffuse ascites identified, moderate particularly more on the right side. IMPRESSION: Moderate ascites.  More confined pockets on the right side. Electronically Signed   By: Karen Kays M.D.   On: 02/02/2023 18:12   DG CHEST PORT 1 VIEW  Result Date: 02/02/2023 CLINICAL DATA:  End-stage renal disease. Hypoglycemia. History of diabetes. EXAM: PORTABLE CHEST 1 VIEW COMPARISON:  06/03/2022 FINDINGS: Blunting of the left costophrenic angle similar to prior study. This may represent recurrent effusion or pleural thickening. Linear scarring or atelectasis in both lung bases, progressed since  prior study. No pneumothorax. Heart size and pulmonary vascularity are normal for technique. IMPRESSION: Blunting of right costophrenic angle, possibly chronic. This may indicate recurrent effusion or pleural thickening. Linear atelectasis in the lung bases, progressing since prior study. Electronically Signed   By: Burman Nieves M.D.   On: 02/02/2023 18:08   Medications:  sodium chloride      Chlorhexidine Gluconate Cloth  6 each Topical Q0600   dextrose  25 g Intravenous STAT   fiber  1 packet Oral BID   gabapentin  100 mg Oral Daily   heparin  5,000 Units Subcutaneous Q8H   pantoprazole  40 mg Oral Daily   rosuvastatin  10 mg  Oral Daily   saccharomyces boulardii  250 mg Oral BID   sodium chloride flush  3 mL Intravenous Q12H    Dialysis Orders: Center: [facility]  on MWF Time: 4 hours EDW: 45 per pt.  Access: LUA Graft placed in 2023 Started HD @ [facility] in 2023.   Assessment/Plan: ESRD: 3L UF pull yesteday. She will receive another treatment tomorrow. There is a discrepancy in her EDW and her current weight - She denied our request for a standing weight; will attempt to obtain again later today.  Will attempt another aggressive UF pull tomorrow to remove any residual fluid. Trend renal panel. Continue renal diet, 1200 ml fluid restriction, daily weights. Monitor BP while undergoing HD treatments. Monitor for symptoms of hypovolemia including hypotension & muscle cramping.  Chronic Diarrhea: Pt. Still having diarrhea. This is being managed by the IM team.  Hyponatremia: Na level decreased (125->122). No levels yet today, will continue to trend. Monitor for symptomatic changes including headache and altered mentation.  Hyperkalemia: levels yesterday somewhat improved in the evening. Should improve with further dialysis, but may consider calcium gluconate if levels remain high. Consider sustained 1K bath x 3 hours. Continue to trend.  T1DM: Managed by IM service.   Santiago Bumpers, PA-S2 02/03/2023, 12:07 PM   [facility]

## 2023-02-03 NOTE — Progress Notes (Signed)
Discharge instructions (including medications) discussed with and copy provided to patient/caregiver 

## 2023-02-03 NOTE — Discharge Summary (Addendum)
Name: Ann Robertson MRN: [id number] DOB: [birth year] 32 y.o. PCP: Felix Pacini, FNP  Date of Admission: 02/02/2023 10:22 AM Date of Discharge:  02/03/2023 Attending Physician: Dr. Criselda Peaches  DISCHARGE DIAGNOSIS:  Primary Problem: Hypoglycemia associated with diabetes [facility])   Hospital Problems: Principal Problem:   Hypoglycemia associated with diabetes [facility]) Active Problems:   Abdominal pain   Anemia   Essential hypertension   Hyperkalemia   ESRD on dialysis (HCC)   Malnutrition of moderate degree   Diarrhea   Ascites    DISCHARGE MEDICATIONS:   Allergies as of 02/03/2023       Reactions   Dilaudid [hydromorphone] Itching, Rash        Medication List     STOP taking these medications    oxyCODONE-acetaminophen 10-325 MG tablet Commonly known as: PERCOCET       TAKE these medications    Accu-Chek Guide test strip Generic drug: glucose blood Use in the morning, at noon, and at bedtime.   Accu-Chek Guide w/Device Kit Use in the morning, at noon, and at bedtime.   Accu-Chek Softclix Lancets lancets Use in the morning, at noon, and at bedtime.   Acidophilus Caps capsule Take 1 capsule by mouth 3 (three) times daily with meals.   Baqsimi Two Pack 3 MG/DOSE Powd Generic drug: Glucagon Place 1 each into the nose as needed.   Fiber-Lax 625 MG tablet Generic drug: polycarbophil Take 1 tablet (625 mg total) by mouth 2 (two) times daily.   gabapentin 100 MG capsule Commonly known as: NEURONTIN Take 1 capsule (100 mg total) by mouth 3 (three) times daily as needed (neuropathy).   insulin lispro 100 UNIT/ML KwikPen Commonly known as: HUMALOG Inject 0-6 Units into the skin 3 (three) times daily with meals. Check Blood Glucose (BG) and inject per scale: BG <150= 0 unit; BG 150-200= 1 unit; BG 201-250= 2 unit; BG 251-300= 3 unit; BG 301-350= 4 unit; BG 351-400= 5 unit; BG >400= 6 unit and Call Primary Care. What changed:  how much to take when to take  this additional instructions   labetalol 100 MG tablet Commonly known as: NORMODYNE Take 1 tablet (100 mg total) by mouth 2 (two) times daily.   Lancet Device Misc 1 each by Does not apply route in the morning, at noon, and at bedtime. May substitute to any manufacturer covered by patient's insurance.   lidocaine-prilocaine cream Commonly known as: EMLA Apply 1 Application topically as needed (For port access).   loperamide 2 MG capsule Commonly known as: IMODIUM Take 2 capsules (4 mg total) by mouth as needed for diarrhea or loose stools. Not to exceed 8 caps per day What changed:  how much to take how to take this when to take this reasons to take this additional instructions   methocarbamol 750 MG tablet Commonly known as: ROBAXIN Take 1 tablet (750 mg total) by mouth every 8 (eight) hours as needed for muscle spasms.   omeprazole 20 MG capsule Commonly known as: PRILOSEC Take 1 capsule (20 mg total) by mouth daily as needed (acid reflux/heartburn).   rosuvastatin 10 MG tablet Commonly known as: CRESTOR Take 1 tablet (10 mg total) by mouth daily. Start taking on: February 04, 2023        DISPOSITION AND FOLLOW-UP:  Ann Robertson was discharged from [facility] in Good condition. At the hospital follow up visit please address:  *Lab suggestions might be measured at outpatient HD facility*  #Hypoglycemia -Ensure patient has  been checking CBG at home and is using Novolog as prescribed, consider long acting if necessary -Measure A1c -Patient would benefit from diabetes counseling/coordination   #ESRD #Hyponatremia #Diarrhea -Repeat BMP -Ensure HD adherence with outpatient facility -Follow-up on fecal elastase results  #Chronic Pain Patient takes Percocet for chronic pain. She has allodynia on exam. Might benefit from further work-up for fibromyalgia/Duloxetine.  #Hypertension -Inquire about adherence to Labetalol     #Ascites -Consider outpatient [place] Complete Abdomen to further characterize ascites vs. anasarca   #Chronic Normocytic Anemia -CBC   #Malnutrition -Address BMI and nutritional status (referral to diabetes coordinator  Follow-up Appointments:  Follow-up Information     Felix Pacini, FNP Follow up.   Specialty: Endocrinology Contact information: [address] [place] [address] [phone]                 HOSPITAL COURSE:  Patient Summary:  #Hypoglycemia Patient presented to the ED after EMS noted CBG of 41. Father apparently gave patient insulin when she was noted to be obtunded which prompted severe drop in CBG. Patient treated with dextrose and insulin therapy was held for the night. Patient responded well and was discharged with instructions to check CBG often and with regimen of Lispro 0-6 units with meals.   #ESRD #Hyponatremia #Hyperkalemia #Diarrhea #NAGMA On admission: K+ 6.3, Na+ 125 on admission. Bicarb is 21. These abnormalities were attributed to a mixed picture of missed HD and diarrhea that had been more problematic recently. Patient received inpatient HD which corrected the above. GI panel negative. Patient treated supportively with loperamide, fiber supplementation, and probiotics. Fecal elastase ordered and was pending at discharge.  #Ascites Patient with ascites as noted on [place] Ascites. [place] in previous years noted cirrhosis but patient without overt risk factors. Plan was to perform a complete US Abdomen but patient adamant about going home.      DISCHARGE INSTRUCTIONS:   Discharge Instructions     Diet - low sodium heart healthy   Complete by: As directed    Discharge instructions   Complete by: As directed    You were treated for low blood sugar and some electrolyte abnormalities that resulted from too much insulin and missed dialysis.  Please check your blood sugar at least 3 times/day and use sliding-scale insulin as  prescribed.  Please follow-up with the appointment I have made for you with the [facility]. Your appointment is on 9/18 @ 2:00 with Dr. Carlynn Purl.  According to the opioid dispense history, you have enough at home to get you through 9/19 so this can be addressed at your appointment with Dr. Carlynn Purl.  Please go to your dialysis session tomorrow and follow-up with your nephrologist.  In the future, please make family members are aware that insulin decreases blood sugar, and if you are ever showing signs of confusion/weakness, to first have them check your blood sugar - Use the Glucagon if it is too low or insulin if it is too high.   Increase activity slowly   Complete by: As directed        SUBJECTIVE:  Patient is feeling well and is wanting to go home and receive HD as outpatient tomorrow per MWF schedule. Discharge Vitals:   BP (!) 171/89 (BP Location: Right Arm)   Pulse 92   Temp 98.8 F (37.1 C) (Oral)   Resp 20   Ht 5\' 5"  (1.651 m)   Wt 50.6 kg   SpO2 96%   BMI 18.56 kg/m  OBJECTIVE:   Constitutional: chronically ill-appearing, lying in bed, in no acute distress Cardiovascular: regular rate and rhythm, no m/r/g, 2+ LEE Pulmonary/Chest: normal work of breathing on room air, lungs clear to auscultation bilaterally Abdominal: distended, non-tender, hyperactive bowel sounds Psych: normal mood and behavior   Pertinent Labs, Studies, and Procedures:     Latest Ref Rng & Units 02/03/2023   10:14 AM 02/02/2023   11:04 AM 06/14/2022    8:17 PM  CBC  WBC 4.0 - 10.5 K/uL 5.0  4.7    Hemoglobin 12.0 - 15.0 g/dL 16.1  09.6  04.5   Hematocrit 36.0 - 46.0 % 34.0  34.9  40.0   Platelets 150 - 400 K/uL 201  163         Latest Ref Rng & Units 02/03/2023   10:14 AM 02/02/2023    4:23 PM 02/02/2023   12:44 PM  CMP  Glucose 70 - 99 mg/dL 409  811    BUN 6 - 20 mg/dL 35  57    Creatinine 9.14 - 1.00 mg/dL 7.82  9.56    Sodium 213 - 145 mmol/L 131  122    Potassium 3.5 - 5.1  mmol/L 4.4  6.1  6.0   Chloride 98 - 111 mmol/L 93  88    CO2 22 - 32 mmol/L 27  22    Calcium 8.9 - 10.3 mg/dL 8.2  7.9    Total Protein 6.5 - 8.1 g/dL 7.1     Total Bilirubin 0.3 - 1.2 mg/dL 0.9     Alkaline Phos 38 - 126 U/L 205     AST 15 - 41 U/L 25     ALT 0 - 44 U/L 12       [place] ASCITES (ABDOMEN LIMITED)  Result Date: 02/02/2023 CLINICAL DATA:  Ascites EXAM: LIMITED ABDOMEN ULTRASOUND FOR ASCITES TECHNIQUE: Limited ultrasound survey for ascites was performed in all four abdominal quadrants. COMPARISON:  None Available. FINDINGS: Scattered diffuse ascites identified, moderate particularly more on the right side. IMPRESSION: Moderate ascites.  More confined pockets on the right side. Electronically Signed   By: Karen Kays M.D.   On: 02/02/2023 18:12   DG CHEST PORT 1 VIEW  Result Date: 02/02/2023 CLINICAL DATA:  End-stage renal disease. Hypoglycemia. History of diabetes. EXAM: PORTABLE CHEST 1 VIEW COMPARISON:  06/03/2022 FINDINGS: Blunting of the left costophrenic angle similar to prior study. This may represent recurrent effusion or pleural thickening. Linear scarring or atelectasis in both lung bases, progressed since prior study. No pneumothorax. Heart size and pulmonary vascularity are normal for technique. IMPRESSION: Blunting of right costophrenic angle, possibly chronic. This may indicate recurrent effusion or pleural thickening. Linear atelectasis in the lung bases, progressing since prior study. Electronically Signed   By: Burman Nieves M.D.   On: 02/02/2023 18:08     Signed: Carmina Miller, DO  Internal Medicine Resident, PGY-1 [facility]  Pager: [phone] 5:29 PM, 02/03/2023

## 2023-02-03 NOTE — Discharge Planning (Signed)
[facility] Patient Discharge Orders- [facility] CLINIC: [place]  Patient's name: Ann Robertson Admit/DC Dates: 02/02/2023 - 02/03/2023  Discharge Diagnoses: Hypoglycemia   Chronic pain  Aranesp: Given: no   Date and amount of last dose: n/a  Last Hgb: 10.5 PRBC's Given: no Date/# of units: n/a ESA dose for discharge: mircera 30 mcg IV q 2 weeks  IV Iron dose at discharge: none  Heparin change: no  EDW Change: No New EDW:   Bath Change: no  Access intervention/Change: no Details:  Hectorol/Calcitriol change: no  Discharge Labs: Calcium 8.2 Phosphorus 4.0 Albumin 2.7 K+ 4.4  IV Antibiotics: no Details:  On Coumadin?: no Last INR: Next INR: Managed By:   OTHER/APPTS/LAB ORDERS:    D/C Meds to be reconciled by nurse after every discharge.  Completed By: Rogers Blocker, PA-C 02/03/2023, 7:14 PM  [facility] Pager: [phone]    Reviewed by: MD:______ RN_______

## 2023-02-03 NOTE — Progress Notes (Signed)
   02/02/23 2226  Vitals  Temp 97.7 F (36.5 C)  Temp Source Oral  BP (!) 165/104  MAP (mmHg) 123  BP Location Right Arm  BP Method Automatic  Patient Position (if appropriate) Lying  Pulse Rate 85  Pulse Rate Source Monitor  ECG Heart Rate 85  Resp 11  Oxygen Therapy  SpO2 100 %  O2 Device Room Air  During Treatment Monitoring  Blood Flow Rate (mL/min) 349 mL/min  Arterial Pressure (mmHg) -1.01 mmHg  Venous Pressure (mmHg) 289.08 mmHg  TMP (mmHg) 15.75 mmHg  Ultrafiltration Rate (mL/min) 1108 mL/min  Dialysate Flow Rate (mL/min) 299 ml/min  Dialysate Potassium Concentration 2  Dialysate Calcium Concentration 2.5  Duration of HD Treatment -hour(s) 3.5 hour(s)  Cumulative Fluid Removed (mL) per Treatment  3000.2  Intra-Hemodialysis Comments Tx completed  Post Treatment  Dialyzer Clearance Lightly streaked  Liters Processed 73  Fluid Removed (mL) 3000 mL  Tolerated HD Treatment Yes  AVG/AVF Arterial Site Held (minutes) 10 minutes  AVG/AVF Venous Site Held (minutes) 10 minutes   Net uf pt tolereated treatment without complication.

## 2023-02-03 NOTE — Plan of Care (Signed)
°  Problem: Clinical Measurements: °Goal: Will remain free from infection °Outcome: Progressing °Goal: Respiratory complications will improve °Outcome: Progressing °  °

## 2023-02-03 NOTE — Plan of Care (Signed)
  Problem: Clinical Measurements: Goal: Diagnostic test results will improve Outcome: Adequate for Discharge   Problem: Activity: Goal: Risk for activity intolerance will decrease Outcome: Adequate for Discharge   Problem: Nutrition: Goal: Adequate nutrition will be maintained Outcome: Adequate for Discharge   Problem: Coping: Goal: Level of anxiety will decrease Outcome: Adequate for Discharge   Problem: Pain Managment: Goal: General experience of comfort will improve Outcome: Adequate for Discharge   Problem: Safety: Goal: Ability to remain free from injury will improve Outcome: Adequate for Discharge   Problem: Education: Goal: Ability to describe self-care measures that may prevent or decrease complications (Diabetes Survival Skills Education) will improve Outcome: Adequate for Discharge Goal: Individualized Educational Video(s) Outcome: Adequate for Discharge   Problem: Coping: Goal: Ability to adjust to condition or change in health will improve Outcome: Adequate for Discharge   Problem: Fluid Volume: Goal: Ability to maintain a balanced intake and output will improve Outcome: Adequate for Discharge   Problem: Health Behavior/Discharge Planning: Goal: Ability to identify and utilize available resources and services will improve Outcome: Adequate for Discharge Goal: Ability to manage health-related needs will improve Outcome: Adequate for Discharge   Problem: Metabolic: Goal: Ability to maintain appropriate glucose levels will improve Outcome: Adequate for Discharge   Problem: Nutritional: Goal: Maintenance of adequate nutrition will improve Outcome: Adequate for Discharge Goal: Progress toward achieving an optimal weight will improve Outcome: Adequate for Discharge   Problem: Skin Integrity: Goal: Risk for impaired skin integrity will decrease Outcome: Adequate for Discharge   Problem: Tissue Perfusion: Goal: Adequacy of tissue perfusion will  improve Outcome: Adequate for Discharge

## 2023-02-04 DIAGNOSIS — L299 Pruritus, unspecified: Secondary | ICD-10-CM | POA: Diagnosis not present

## 2023-02-04 DIAGNOSIS — N2581 Secondary hyperparathyroidism of renal origin: Secondary | ICD-10-CM | POA: Diagnosis not present

## 2023-02-04 DIAGNOSIS — Z992 Dependence on renal dialysis: Secondary | ICD-10-CM | POA: Diagnosis not present

## 2023-02-04 DIAGNOSIS — N186 End stage renal disease: Secondary | ICD-10-CM | POA: Diagnosis not present

## 2023-02-04 LAB — PANCREATIC ELASTASE, FECAL: Pancreatic Elastase-1, Stool: 27 ug Elast./g — ABNORMAL LOW (ref >200)

## 2023-02-06 NOTE — TOC Transition Note (Signed)
Transition of Care - Initial Contact from Inpatient Facility  Date of discharge: 02/03/23 Date of contact: 02/06/23  Method: Attempted Phone Call  Spoke to: No Answer  Patient contacted to discuss transition of care from recent inpatient hospitalization but she didn't answer to phone. Left a voicemail to reach out to [facility] for any questions or concerns.   Patient will return to her outpatient center on 02/04/23. Next HD 02/07/23.  Salome Holmes, NP

## 2023-02-07 DIAGNOSIS — N186 End stage renal disease: Secondary | ICD-10-CM | POA: Diagnosis not present

## 2023-02-07 DIAGNOSIS — L299 Pruritus, unspecified: Secondary | ICD-10-CM | POA: Diagnosis not present

## 2023-02-07 DIAGNOSIS — Z992 Dependence on renal dialysis: Secondary | ICD-10-CM | POA: Diagnosis not present

## 2023-02-07 DIAGNOSIS — N2581 Secondary hyperparathyroidism of renal origin: Secondary | ICD-10-CM | POA: Diagnosis not present

## 2023-02-09 DIAGNOSIS — N2581 Secondary hyperparathyroidism of renal origin: Secondary | ICD-10-CM | POA: Diagnosis not present

## 2023-02-09 DIAGNOSIS — L299 Pruritus, unspecified: Secondary | ICD-10-CM | POA: Diagnosis not present

## 2023-02-09 DIAGNOSIS — Z992 Dependence on renal dialysis: Secondary | ICD-10-CM | POA: Diagnosis not present

## 2023-02-09 DIAGNOSIS — N186 End stage renal disease: Secondary | ICD-10-CM | POA: Diagnosis not present

## 2023-02-11 DIAGNOSIS — L299 Pruritus, unspecified: Secondary | ICD-10-CM | POA: Diagnosis not present

## 2023-02-11 DIAGNOSIS — N186 End stage renal disease: Secondary | ICD-10-CM | POA: Diagnosis not present

## 2023-02-11 DIAGNOSIS — Z992 Dependence on renal dialysis: Secondary | ICD-10-CM | POA: Diagnosis not present

## 2023-02-11 DIAGNOSIS — N2581 Secondary hyperparathyroidism of renal origin: Secondary | ICD-10-CM | POA: Diagnosis not present

## 2023-02-14 ENCOUNTER — Other Ambulatory Visit: Payer: Self-pay | Admitting: Student

## 2023-02-14 DIAGNOSIS — L299 Pruritus, unspecified: Secondary | ICD-10-CM | POA: Diagnosis not present

## 2023-02-14 DIAGNOSIS — Z992 Dependence on renal dialysis: Secondary | ICD-10-CM | POA: Diagnosis not present

## 2023-02-14 DIAGNOSIS — N2581 Secondary hyperparathyroidism of renal origin: Secondary | ICD-10-CM | POA: Diagnosis not present

## 2023-02-14 DIAGNOSIS — N186 End stage renal disease: Secondary | ICD-10-CM | POA: Diagnosis not present

## 2023-02-14 MED ORDER — PANCRELIPASE (LIP-PROT-AMYL) 36000-114000 UNITS PO CPEP: ORAL_CAPSULE | ORAL | 11 refills | Status: DC

## 2023-02-15 ENCOUNTER — Telehealth: Payer: Self-pay

## 2023-02-15 NOTE — Telephone Encounter (Signed)
Pt called and canceled her [facility]  for 9/24 with Dr .Carlynn Purl.. stated she wanted the Appt with Dr Annie Paras I explained he will not be in the office until DEC 2024 she stated she will wait for him so her new appt is 12/17 with Dr Annie Paras

## 2023-02-16 ENCOUNTER — Ambulatory Visit: Payer: Medicare HMO | Admitting: Internal Medicine

## 2023-02-16 DIAGNOSIS — N2581 Secondary hyperparathyroidism of renal origin: Secondary | ICD-10-CM | POA: Diagnosis not present

## 2023-02-16 DIAGNOSIS — L299 Pruritus, unspecified: Secondary | ICD-10-CM | POA: Diagnosis not present

## 2023-02-16 DIAGNOSIS — Z992 Dependence on renal dialysis: Secondary | ICD-10-CM | POA: Diagnosis not present

## 2023-02-16 DIAGNOSIS — N186 End stage renal disease: Secondary | ICD-10-CM | POA: Diagnosis not present

## 2023-02-17 DIAGNOSIS — Z09 Encounter for follow-up examination after completed treatment for conditions other than malignant neoplasm: Secondary | ICD-10-CM | POA: Diagnosis not present

## 2023-02-17 DIAGNOSIS — E1140 Type 2 diabetes mellitus with diabetic neuropathy, unspecified: Secondary | ICD-10-CM | POA: Diagnosis not present

## 2023-02-17 DIAGNOSIS — G8929 Other chronic pain: Secondary | ICD-10-CM | POA: Diagnosis not present

## 2023-02-17 DIAGNOSIS — M5416 Radiculopathy, lumbar region: Secondary | ICD-10-CM | POA: Diagnosis not present

## 2023-02-17 DIAGNOSIS — I10 Essential (primary) hypertension: Secondary | ICD-10-CM | POA: Diagnosis not present

## 2023-02-17 DIAGNOSIS — R1013 Epigastric pain: Secondary | ICD-10-CM | POA: Diagnosis not present

## 2023-02-17 DIAGNOSIS — Z79899 Other long term (current) drug therapy: Secondary | ICD-10-CM | POA: Diagnosis not present

## 2023-02-17 DIAGNOSIS — E1065 Type 1 diabetes mellitus with hyperglycemia: Secondary | ICD-10-CM | POA: Diagnosis not present

## 2023-02-17 DIAGNOSIS — Z9181 History of falling: Secondary | ICD-10-CM | POA: Diagnosis not present

## 2023-02-18 DIAGNOSIS — L299 Pruritus, unspecified: Secondary | ICD-10-CM | POA: Diagnosis not present

## 2023-02-18 DIAGNOSIS — Z992 Dependence on renal dialysis: Secondary | ICD-10-CM | POA: Diagnosis not present

## 2023-02-18 DIAGNOSIS — N186 End stage renal disease: Secondary | ICD-10-CM | POA: Diagnosis not present

## 2023-02-18 DIAGNOSIS — N2581 Secondary hyperparathyroidism of renal origin: Secondary | ICD-10-CM | POA: Diagnosis not present

## 2023-02-21 DIAGNOSIS — L299 Pruritus, unspecified: Secondary | ICD-10-CM | POA: Diagnosis not present

## 2023-02-21 DIAGNOSIS — N2581 Secondary hyperparathyroidism of renal origin: Secondary | ICD-10-CM | POA: Diagnosis not present

## 2023-02-21 DIAGNOSIS — Z992 Dependence on renal dialysis: Secondary | ICD-10-CM | POA: Diagnosis not present

## 2023-02-21 DIAGNOSIS — N186 End stage renal disease: Secondary | ICD-10-CM | POA: Diagnosis not present

## 2023-02-23 DIAGNOSIS — N2581 Secondary hyperparathyroidism of renal origin: Secondary | ICD-10-CM | POA: Diagnosis not present

## 2023-02-23 DIAGNOSIS — Z992 Dependence on renal dialysis: Secondary | ICD-10-CM | POA: Diagnosis not present

## 2023-02-23 DIAGNOSIS — N186 End stage renal disease: Secondary | ICD-10-CM | POA: Diagnosis not present

## 2023-02-23 DIAGNOSIS — L299 Pruritus, unspecified: Secondary | ICD-10-CM | POA: Diagnosis not present

## 2023-02-25 DIAGNOSIS — N186 End stage renal disease: Secondary | ICD-10-CM | POA: Diagnosis not present

## 2023-02-25 DIAGNOSIS — L299 Pruritus, unspecified: Secondary | ICD-10-CM | POA: Diagnosis not present

## 2023-02-25 DIAGNOSIS — Z992 Dependence on renal dialysis: Secondary | ICD-10-CM | POA: Diagnosis not present

## 2023-02-25 DIAGNOSIS — N2581 Secondary hyperparathyroidism of renal origin: Secondary | ICD-10-CM | POA: Diagnosis not present

## 2023-02-28 DIAGNOSIS — N186 End stage renal disease: Secondary | ICD-10-CM | POA: Diagnosis not present

## 2023-02-28 DIAGNOSIS — L299 Pruritus, unspecified: Secondary | ICD-10-CM | POA: Diagnosis not present

## 2023-02-28 DIAGNOSIS — I129 Hypertensive chronic kidney disease with stage 1 through stage 4 chronic kidney disease, or unspecified chronic kidney disease: Secondary | ICD-10-CM | POA: Diagnosis not present

## 2023-02-28 DIAGNOSIS — Z992 Dependence on renal dialysis: Secondary | ICD-10-CM | POA: Diagnosis not present

## 2023-02-28 DIAGNOSIS — N2581 Secondary hyperparathyroidism of renal origin: Secondary | ICD-10-CM | POA: Diagnosis not present

## 2023-03-02 DIAGNOSIS — N186 End stage renal disease: Secondary | ICD-10-CM | POA: Diagnosis not present

## 2023-03-02 DIAGNOSIS — L2989 Other pruritus: Secondary | ICD-10-CM | POA: Diagnosis not present

## 2023-03-02 DIAGNOSIS — N2581 Secondary hyperparathyroidism of renal origin: Secondary | ICD-10-CM | POA: Diagnosis not present

## 2023-03-02 DIAGNOSIS — Z992 Dependence on renal dialysis: Secondary | ICD-10-CM | POA: Diagnosis not present

## 2023-03-04 DIAGNOSIS — Z992 Dependence on renal dialysis: Secondary | ICD-10-CM | POA: Diagnosis not present

## 2023-03-04 DIAGNOSIS — L2989 Other pruritus: Secondary | ICD-10-CM | POA: Diagnosis not present

## 2023-03-04 DIAGNOSIS — N2581 Secondary hyperparathyroidism of renal origin: Secondary | ICD-10-CM | POA: Diagnosis not present

## 2023-03-04 DIAGNOSIS — N186 End stage renal disease: Secondary | ICD-10-CM | POA: Diagnosis not present

## 2023-03-07 DIAGNOSIS — N2581 Secondary hyperparathyroidism of renal origin: Secondary | ICD-10-CM | POA: Diagnosis not present

## 2023-03-07 DIAGNOSIS — Z992 Dependence on renal dialysis: Secondary | ICD-10-CM | POA: Diagnosis not present

## 2023-03-07 DIAGNOSIS — N186 End stage renal disease: Secondary | ICD-10-CM | POA: Diagnosis not present

## 2023-03-07 DIAGNOSIS — L2989 Other pruritus: Secondary | ICD-10-CM | POA: Diagnosis not present

## 2023-03-09 DIAGNOSIS — N2581 Secondary hyperparathyroidism of renal origin: Secondary | ICD-10-CM | POA: Diagnosis not present

## 2023-03-09 DIAGNOSIS — N186 End stage renal disease: Secondary | ICD-10-CM | POA: Diagnosis not present

## 2023-03-09 DIAGNOSIS — Z992 Dependence on renal dialysis: Secondary | ICD-10-CM | POA: Diagnosis not present

## 2023-03-09 DIAGNOSIS — L2989 Other pruritus: Secondary | ICD-10-CM | POA: Diagnosis not present

## 2023-03-12 DIAGNOSIS — L2989 Other pruritus: Secondary | ICD-10-CM | POA: Diagnosis not present

## 2023-03-12 DIAGNOSIS — N186 End stage renal disease: Secondary | ICD-10-CM | POA: Diagnosis not present

## 2023-03-12 DIAGNOSIS — N2581 Secondary hyperparathyroidism of renal origin: Secondary | ICD-10-CM | POA: Diagnosis not present

## 2023-03-12 DIAGNOSIS — Z992 Dependence on renal dialysis: Secondary | ICD-10-CM | POA: Diagnosis not present

## 2023-03-14 DIAGNOSIS — Z992 Dependence on renal dialysis: Secondary | ICD-10-CM | POA: Diagnosis not present

## 2023-03-14 DIAGNOSIS — L2989 Other pruritus: Secondary | ICD-10-CM | POA: Diagnosis not present

## 2023-03-14 DIAGNOSIS — N2581 Secondary hyperparathyroidism of renal origin: Secondary | ICD-10-CM | POA: Diagnosis not present

## 2023-03-14 DIAGNOSIS — N186 End stage renal disease: Secondary | ICD-10-CM | POA: Diagnosis not present

## 2023-03-16 DIAGNOSIS — N2581 Secondary hyperparathyroidism of renal origin: Secondary | ICD-10-CM | POA: Diagnosis not present

## 2023-03-16 DIAGNOSIS — Z992 Dependence on renal dialysis: Secondary | ICD-10-CM | POA: Diagnosis not present

## 2023-03-16 DIAGNOSIS — L2989 Other pruritus: Secondary | ICD-10-CM | POA: Diagnosis not present

## 2023-03-16 DIAGNOSIS — N186 End stage renal disease: Secondary | ICD-10-CM | POA: Diagnosis not present

## 2023-03-17 DIAGNOSIS — E1140 Type 2 diabetes mellitus with diabetic neuropathy, unspecified: Secondary | ICD-10-CM | POA: Diagnosis not present

## 2023-03-17 DIAGNOSIS — Z79899 Other long term (current) drug therapy: Secondary | ICD-10-CM | POA: Diagnosis not present

## 2023-03-17 DIAGNOSIS — Z681 Body mass index (BMI) 19 or less, adult: Secondary | ICD-10-CM | POA: Diagnosis not present

## 2023-03-17 DIAGNOSIS — Z9181 History of falling: Secondary | ICD-10-CM | POA: Diagnosis not present

## 2023-03-17 DIAGNOSIS — R1013 Epigastric pain: Secondary | ICD-10-CM | POA: Diagnosis not present

## 2023-03-17 DIAGNOSIS — I10 Essential (primary) hypertension: Secondary | ICD-10-CM | POA: Diagnosis not present

## 2023-03-17 DIAGNOSIS — E1065 Type 1 diabetes mellitus with hyperglycemia: Secondary | ICD-10-CM | POA: Diagnosis not present

## 2023-03-17 DIAGNOSIS — M5416 Radiculopathy, lumbar region: Secondary | ICD-10-CM | POA: Diagnosis not present

## 2023-03-17 DIAGNOSIS — G8929 Other chronic pain: Secondary | ICD-10-CM | POA: Diagnosis not present

## 2023-03-19 DIAGNOSIS — N186 End stage renal disease: Secondary | ICD-10-CM | POA: Diagnosis not present

## 2023-03-19 DIAGNOSIS — Z992 Dependence on renal dialysis: Secondary | ICD-10-CM | POA: Diagnosis not present

## 2023-03-19 DIAGNOSIS — L2989 Other pruritus: Secondary | ICD-10-CM | POA: Diagnosis not present

## 2023-03-19 DIAGNOSIS — N2581 Secondary hyperparathyroidism of renal origin: Secondary | ICD-10-CM | POA: Diagnosis not present

## 2023-03-21 DIAGNOSIS — N186 End stage renal disease: Secondary | ICD-10-CM | POA: Diagnosis not present

## 2023-03-21 DIAGNOSIS — N2581 Secondary hyperparathyroidism of renal origin: Secondary | ICD-10-CM | POA: Diagnosis not present

## 2023-03-21 DIAGNOSIS — Z992 Dependence on renal dialysis: Secondary | ICD-10-CM | POA: Diagnosis not present

## 2023-03-21 DIAGNOSIS — L2989 Other pruritus: Secondary | ICD-10-CM | POA: Diagnosis not present

## 2023-03-23 DIAGNOSIS — L2989 Other pruritus: Secondary | ICD-10-CM | POA: Diagnosis not present

## 2023-03-23 DIAGNOSIS — N186 End stage renal disease: Secondary | ICD-10-CM | POA: Diagnosis not present

## 2023-03-23 DIAGNOSIS — N2581 Secondary hyperparathyroidism of renal origin: Secondary | ICD-10-CM | POA: Diagnosis not present

## 2023-03-23 DIAGNOSIS — Z992 Dependence on renal dialysis: Secondary | ICD-10-CM | POA: Diagnosis not present

## 2023-03-25 DIAGNOSIS — Z992 Dependence on renal dialysis: Secondary | ICD-10-CM | POA: Diagnosis not present

## 2023-03-25 DIAGNOSIS — L2989 Other pruritus: Secondary | ICD-10-CM | POA: Diagnosis not present

## 2023-03-25 DIAGNOSIS — N2581 Secondary hyperparathyroidism of renal origin: Secondary | ICD-10-CM | POA: Diagnosis not present

## 2023-03-25 DIAGNOSIS — N186 End stage renal disease: Secondary | ICD-10-CM | POA: Diagnosis not present

## 2023-03-28 DIAGNOSIS — Z992 Dependence on renal dialysis: Secondary | ICD-10-CM | POA: Diagnosis not present

## 2023-03-28 DIAGNOSIS — N2581 Secondary hyperparathyroidism of renal origin: Secondary | ICD-10-CM | POA: Diagnosis not present

## 2023-03-28 DIAGNOSIS — L2989 Other pruritus: Secondary | ICD-10-CM | POA: Diagnosis not present

## 2023-03-28 DIAGNOSIS — N186 End stage renal disease: Secondary | ICD-10-CM | POA: Diagnosis not present

## 2023-03-30 DIAGNOSIS — L2989 Other pruritus: Secondary | ICD-10-CM | POA: Diagnosis not present

## 2023-03-30 DIAGNOSIS — N186 End stage renal disease: Secondary | ICD-10-CM | POA: Diagnosis not present

## 2023-03-30 DIAGNOSIS — Z992 Dependence on renal dialysis: Secondary | ICD-10-CM | POA: Diagnosis not present

## 2023-03-30 DIAGNOSIS — N2581 Secondary hyperparathyroidism of renal origin: Secondary | ICD-10-CM | POA: Diagnosis not present

## 2023-03-31 DIAGNOSIS — Z992 Dependence on renal dialysis: Secondary | ICD-10-CM | POA: Diagnosis not present

## 2023-03-31 DIAGNOSIS — N186 End stage renal disease: Secondary | ICD-10-CM | POA: Diagnosis not present

## 2023-03-31 DIAGNOSIS — I129 Hypertensive chronic kidney disease with stage 1 through stage 4 chronic kidney disease, or unspecified chronic kidney disease: Secondary | ICD-10-CM | POA: Diagnosis not present

## 2023-04-01 DIAGNOSIS — Z992 Dependence on renal dialysis: Secondary | ICD-10-CM | POA: Diagnosis not present

## 2023-04-01 DIAGNOSIS — N186 End stage renal disease: Secondary | ICD-10-CM | POA: Diagnosis not present

## 2023-04-01 DIAGNOSIS — L2989 Other pruritus: Secondary | ICD-10-CM | POA: Diagnosis not present

## 2023-04-01 DIAGNOSIS — N2581 Secondary hyperparathyroidism of renal origin: Secondary | ICD-10-CM | POA: Diagnosis not present

## 2023-04-04 DIAGNOSIS — Z992 Dependence on renal dialysis: Secondary | ICD-10-CM | POA: Diagnosis not present

## 2023-04-04 DIAGNOSIS — N2581 Secondary hyperparathyroidism of renal origin: Secondary | ICD-10-CM | POA: Diagnosis not present

## 2023-04-04 DIAGNOSIS — L2989 Other pruritus: Secondary | ICD-10-CM | POA: Diagnosis not present

## 2023-04-04 DIAGNOSIS — N186 End stage renal disease: Secondary | ICD-10-CM | POA: Diagnosis not present

## 2023-04-06 DIAGNOSIS — Z992 Dependence on renal dialysis: Secondary | ICD-10-CM | POA: Diagnosis not present

## 2023-04-06 DIAGNOSIS — N2581 Secondary hyperparathyroidism of renal origin: Secondary | ICD-10-CM | POA: Diagnosis not present

## 2023-04-06 DIAGNOSIS — L2989 Other pruritus: Secondary | ICD-10-CM | POA: Diagnosis not present

## 2023-04-06 DIAGNOSIS — N186 End stage renal disease: Secondary | ICD-10-CM | POA: Diagnosis not present

## 2023-04-08 DIAGNOSIS — N2581 Secondary hyperparathyroidism of renal origin: Secondary | ICD-10-CM | POA: Diagnosis not present

## 2023-04-08 DIAGNOSIS — L2989 Other pruritus: Secondary | ICD-10-CM | POA: Diagnosis not present

## 2023-04-08 DIAGNOSIS — Z992 Dependence on renal dialysis: Secondary | ICD-10-CM | POA: Diagnosis not present

## 2023-04-08 DIAGNOSIS — N186 End stage renal disease: Secondary | ICD-10-CM | POA: Diagnosis not present

## 2023-04-11 DIAGNOSIS — N2581 Secondary hyperparathyroidism of renal origin: Secondary | ICD-10-CM | POA: Diagnosis not present

## 2023-04-11 DIAGNOSIS — N186 End stage renal disease: Secondary | ICD-10-CM | POA: Diagnosis not present

## 2023-04-11 DIAGNOSIS — Z992 Dependence on renal dialysis: Secondary | ICD-10-CM | POA: Diagnosis not present

## 2023-04-11 DIAGNOSIS — L2989 Other pruritus: Secondary | ICD-10-CM | POA: Diagnosis not present

## 2023-04-13 DIAGNOSIS — Z992 Dependence on renal dialysis: Secondary | ICD-10-CM | POA: Diagnosis not present

## 2023-04-13 DIAGNOSIS — N2581 Secondary hyperparathyroidism of renal origin: Secondary | ICD-10-CM | POA: Diagnosis not present

## 2023-04-13 DIAGNOSIS — L2989 Other pruritus: Secondary | ICD-10-CM | POA: Diagnosis not present

## 2023-04-13 DIAGNOSIS — N186 End stage renal disease: Secondary | ICD-10-CM | POA: Diagnosis not present

## 2023-04-14 DIAGNOSIS — Z9181 History of falling: Secondary | ICD-10-CM | POA: Diagnosis not present

## 2023-04-14 DIAGNOSIS — Z79899 Other long term (current) drug therapy: Secondary | ICD-10-CM | POA: Diagnosis not present

## 2023-04-14 DIAGNOSIS — G8929 Other chronic pain: Secondary | ICD-10-CM | POA: Diagnosis not present

## 2023-04-14 DIAGNOSIS — E1065 Type 1 diabetes mellitus with hyperglycemia: Secondary | ICD-10-CM | POA: Diagnosis not present

## 2023-04-14 DIAGNOSIS — M5416 Radiculopathy, lumbar region: Secondary | ICD-10-CM | POA: Diagnosis not present

## 2023-04-14 DIAGNOSIS — I10 Essential (primary) hypertension: Secondary | ICD-10-CM | POA: Diagnosis not present

## 2023-04-14 DIAGNOSIS — Z681 Body mass index (BMI) 19 or less, adult: Secondary | ICD-10-CM | POA: Diagnosis not present

## 2023-04-14 DIAGNOSIS — R1013 Epigastric pain: Secondary | ICD-10-CM | POA: Diagnosis not present

## 2023-04-14 DIAGNOSIS — E1140 Type 2 diabetes mellitus with diabetic neuropathy, unspecified: Secondary | ICD-10-CM | POA: Diagnosis not present

## 2023-04-15 DIAGNOSIS — N186 End stage renal disease: Secondary | ICD-10-CM | POA: Diagnosis not present

## 2023-04-15 DIAGNOSIS — Z992 Dependence on renal dialysis: Secondary | ICD-10-CM | POA: Diagnosis not present

## 2023-04-15 DIAGNOSIS — L2989 Other pruritus: Secondary | ICD-10-CM | POA: Diagnosis not present

## 2023-04-15 DIAGNOSIS — N2581 Secondary hyperparathyroidism of renal origin: Secondary | ICD-10-CM | POA: Diagnosis not present

## 2023-04-18 DIAGNOSIS — Z992 Dependence on renal dialysis: Secondary | ICD-10-CM | POA: Diagnosis not present

## 2023-04-18 DIAGNOSIS — N2581 Secondary hyperparathyroidism of renal origin: Secondary | ICD-10-CM | POA: Diagnosis not present

## 2023-04-18 DIAGNOSIS — L2989 Other pruritus: Secondary | ICD-10-CM | POA: Diagnosis not present

## 2023-04-18 DIAGNOSIS — N186 End stage renal disease: Secondary | ICD-10-CM | POA: Diagnosis not present

## 2023-04-19 DIAGNOSIS — Z79899 Other long term (current) drug therapy: Secondary | ICD-10-CM | POA: Diagnosis not present

## 2023-04-20 DIAGNOSIS — Z992 Dependence on renal dialysis: Secondary | ICD-10-CM | POA: Diagnosis not present

## 2023-04-20 DIAGNOSIS — N186 End stage renal disease: Secondary | ICD-10-CM | POA: Diagnosis not present

## 2023-04-20 DIAGNOSIS — L2989 Other pruritus: Secondary | ICD-10-CM | POA: Diagnosis not present

## 2023-04-20 DIAGNOSIS — N2581 Secondary hyperparathyroidism of renal origin: Secondary | ICD-10-CM | POA: Diagnosis not present

## 2023-04-22 DIAGNOSIS — Z992 Dependence on renal dialysis: Secondary | ICD-10-CM | POA: Diagnosis not present

## 2023-04-22 DIAGNOSIS — L2989 Other pruritus: Secondary | ICD-10-CM | POA: Diagnosis not present

## 2023-04-22 DIAGNOSIS — N186 End stage renal disease: Secondary | ICD-10-CM | POA: Diagnosis not present

## 2023-04-22 DIAGNOSIS — N2581 Secondary hyperparathyroidism of renal origin: Secondary | ICD-10-CM | POA: Diagnosis not present

## 2023-04-26 DIAGNOSIS — N186 End stage renal disease: Secondary | ICD-10-CM | POA: Diagnosis not present

## 2023-04-26 DIAGNOSIS — N2581 Secondary hyperparathyroidism of renal origin: Secondary | ICD-10-CM | POA: Diagnosis not present

## 2023-04-26 DIAGNOSIS — Z992 Dependence on renal dialysis: Secondary | ICD-10-CM | POA: Diagnosis not present

## 2023-04-26 DIAGNOSIS — L2989 Other pruritus: Secondary | ICD-10-CM | POA: Diagnosis not present

## 2023-04-30 DIAGNOSIS — N186 End stage renal disease: Secondary | ICD-10-CM | POA: Diagnosis not present

## 2023-04-30 DIAGNOSIS — I129 Hypertensive chronic kidney disease with stage 1 through stage 4 chronic kidney disease, or unspecified chronic kidney disease: Secondary | ICD-10-CM | POA: Diagnosis not present

## 2023-04-30 DIAGNOSIS — N2581 Secondary hyperparathyroidism of renal origin: Secondary | ICD-10-CM | POA: Diagnosis not present

## 2023-04-30 DIAGNOSIS — Z992 Dependence on renal dialysis: Secondary | ICD-10-CM | POA: Diagnosis not present

## 2023-04-30 DIAGNOSIS — L2989 Other pruritus: Secondary | ICD-10-CM | POA: Diagnosis not present

## 2023-05-17 ENCOUNTER — Ambulatory Visit: Payer: Medicare HMO | Admitting: Student

## 2023-05-29 ENCOUNTER — Encounter (HOSPITAL_COMMUNITY): Payer: Self-pay

## 2023-05-29 ENCOUNTER — Other Ambulatory Visit: Payer: Self-pay

## 2023-05-29 ENCOUNTER — Inpatient Hospital Stay (HOSPITAL_COMMUNITY)
Admission: EM | Admit: 2023-05-29 | Discharge: 2023-06-02 | DRG: 637 | Disposition: A | Discharge status: Home / Self Care | Payer: Medicare HMO | Attending: Family Medicine | Admitting: Family Medicine

## 2023-05-29 DIAGNOSIS — N2581 Secondary hyperparathyroidism of renal origin: Secondary | ICD-10-CM | POA: Diagnosis present

## 2023-05-29 DIAGNOSIS — F17210 Nicotine dependence, cigarettes, uncomplicated: Secondary | ICD-10-CM | POA: Diagnosis present

## 2023-05-29 DIAGNOSIS — Z8249 Family history of ischemic heart disease and other diseases of the circulatory system: Secondary | ICD-10-CM

## 2023-05-29 DIAGNOSIS — Z1152 Encounter for screening for COVID-19: Secondary | ICD-10-CM

## 2023-05-29 DIAGNOSIS — E1065 Type 1 diabetes mellitus with hyperglycemia: Secondary | ICD-10-CM | POA: Diagnosis not present

## 2023-05-29 DIAGNOSIS — Z83438 Family history of other disorder of lipoprotein metabolism and other lipidemia: Secondary | ICD-10-CM

## 2023-05-29 DIAGNOSIS — R739 Hyperglycemia, unspecified: Principal | ICD-10-CM | POA: Diagnosis present

## 2023-05-29 DIAGNOSIS — I120 Hypertensive chronic kidney disease with stage 5 chronic kidney disease or end stage renal disease: Secondary | ICD-10-CM | POA: Diagnosis present

## 2023-05-29 DIAGNOSIS — E1040 Type 1 diabetes mellitus with diabetic neuropathy, unspecified: Secondary | ICD-10-CM | POA: Diagnosis present

## 2023-05-29 DIAGNOSIS — D696 Thrombocytopenia, unspecified: Secondary | ICD-10-CM | POA: Diagnosis present

## 2023-05-29 DIAGNOSIS — N186 End stage renal disease: Secondary | ICD-10-CM | POA: Diagnosis present

## 2023-05-29 DIAGNOSIS — Z833 Family history of diabetes mellitus: Secondary | ICD-10-CM

## 2023-05-29 DIAGNOSIS — N189 Chronic kidney disease, unspecified: Secondary | ICD-10-CM

## 2023-05-29 DIAGNOSIS — R112 Nausea with vomiting, unspecified: Secondary | ICD-10-CM | POA: Diagnosis present

## 2023-05-29 DIAGNOSIS — E860 Dehydration: Secondary | ICD-10-CM | POA: Diagnosis present

## 2023-05-29 DIAGNOSIS — L89896 Pressure-induced deep tissue damage of other site: Secondary | ICD-10-CM | POA: Diagnosis present

## 2023-05-29 DIAGNOSIS — E1022 Type 1 diabetes mellitus with diabetic chronic kidney disease: Secondary | ICD-10-CM | POA: Diagnosis present

## 2023-05-29 DIAGNOSIS — Z794 Long term (current) use of insulin: Secondary | ICD-10-CM

## 2023-05-29 DIAGNOSIS — E7800 Pure hypercholesterolemia, unspecified: Secondary | ICD-10-CM | POA: Diagnosis present

## 2023-05-29 DIAGNOSIS — I10 Essential (primary) hypertension: Secondary | ICD-10-CM | POA: Diagnosis present

## 2023-05-29 DIAGNOSIS — Z841 Family history of disorders of kidney and ureter: Secondary | ICD-10-CM

## 2023-05-29 DIAGNOSIS — Z992 Dependence on renal dialysis: Secondary | ICD-10-CM

## 2023-05-29 DIAGNOSIS — T383X6A Underdosing of insulin and oral hypoglycemic [antidiabetic] drugs, initial encounter: Secondary | ICD-10-CM | POA: Diagnosis present

## 2023-05-29 DIAGNOSIS — E871 Hypo-osmolality and hyponatremia: Secondary | ICD-10-CM | POA: Diagnosis present

## 2023-05-29 DIAGNOSIS — Z862 Personal history of diseases of the blood and blood-forming organs and certain disorders involving the immune mechanism: Secondary | ICD-10-CM

## 2023-05-29 DIAGNOSIS — Z91158 Patient's noncompliance with renal dialysis for other reason: Secondary | ICD-10-CM

## 2023-05-29 DIAGNOSIS — Z823 Family history of stroke: Secondary | ICD-10-CM

## 2023-05-29 DIAGNOSIS — Z79899 Other long term (current) drug therapy: Secondary | ICD-10-CM

## 2023-05-29 DIAGNOSIS — E109 Type 1 diabetes mellitus without complications: Secondary | ICD-10-CM | POA: Diagnosis present

## 2023-05-29 DIAGNOSIS — D631 Anemia in chronic kidney disease: Secondary | ICD-10-CM | POA: Diagnosis present

## 2023-05-29 DIAGNOSIS — Z91199 Patient's noncompliance with other medical treatment and regimen due to unspecified reason: Secondary | ICD-10-CM

## 2023-05-29 DIAGNOSIS — E10649 Type 1 diabetes mellitus with hypoglycemia without coma: Secondary | ICD-10-CM | POA: Diagnosis not present

## 2023-05-29 DIAGNOSIS — E785 Hyperlipidemia, unspecified: Secondary | ICD-10-CM | POA: Diagnosis present

## 2023-05-29 DIAGNOSIS — K529 Noninfective gastroenteritis and colitis, unspecified: Secondary | ICD-10-CM | POA: Diagnosis present

## 2023-05-29 DIAGNOSIS — M898X9 Other specified disorders of bone, unspecified site: Secondary | ICD-10-CM | POA: Diagnosis present

## 2023-05-29 DIAGNOSIS — R188 Other ascites: Secondary | ICD-10-CM | POA: Diagnosis present

## 2023-05-29 DIAGNOSIS — E8779 Other fluid overload: Secondary | ICD-10-CM | POA: Diagnosis present

## 2023-05-29 DIAGNOSIS — Z885 Allergy status to narcotic agent status: Secondary | ICD-10-CM

## 2023-05-29 HISTORY — DX: End stage renal disease: N18.6

## 2023-05-29 LAB — CBG MONITORING, ED: Glucose-Capillary: >600 mg/dL (ref 70–99)

## 2023-05-29 NOTE — ED Triage Notes (Signed)
PER EMS: pt is from with c/o global abdominal pain, n/v with some abd distension and pedal swelling. She goes to dialysis on Mon/Wed/Friday, went on Saturday due to holiday schedule changes.    BP-194/120, HR-90, 99% RA, CBG-high. She is insulin dependent but did not take her insulin today.  22gRFA and received 4mg  of Zofran IV and 100 mcg of Fentanyl en route

## 2023-05-29 NOTE — ED Provider Notes (Signed)
[place] [facility]ERGENCY DEPARTMENT AT [facility] Provider Note  CSN: [id number] Arrival date & time: 05/29/23 2350  Chief Complaint(s) Abdominal Pain  HPI Ann Robertson is a 32 y.o. female with past medical history as below, significant for ESRD on HD Monday Wednesday Friday, hyperkalemia HLD, who presents to the ED with complaint of abdominal pain, swelling  scheduled dialysis Monday Wednesday Friday, she went on Saturday secondary to holiday schedule.  She missed 2 sessions this week. Not taking insulin today because she felt unwell and didn't think she needed it. Reports abdominal pain diffusely, nausea vomiting, lower extremity swelling. No fevers. Vomiting since last night, no diarrhea. Poor PO today 2/2 abd pain and n/v. Pedal swelling worsened over last few days.   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   Neuropathy    Preeclampsia    STD (sexually transmitted disease)    Patient Active Problem List   Diagnosis Date Noted   Hypoglycemia associated with diabetes (HCC) 02/02/2023   Diarrhea 02/02/2023   Ascites 02/02/2023   Malnutrition of moderate degree 06/08/2022   DKA (diabetic ketoacidosis) (HCC) 06/04/2022   Hyperosmolar hyperglycemic state (HHS) (HCC) 02/21/2022   Hyperglycemia due to diabetes mellitus (HCC) 02/08/2022   ESRD on dialysis (HCC) 06/16/2021   CKD (chronic kidney disease), stage III (HCC) 04/19/2021   Acute metabolic encephalopathy 04/18/2021   Hyperkalemia 04/18/2021   Prolonged QT interval 04/11/2021   Mixed hyperlipidemia 03/09/2021   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 Authorizing Provider  polycarbophil (FIBERCON) 625 MG tablet Take 1 tablet (625 mg total) by mouth 2 (two) times daily. 02/03/23   Carmina Miller, DO  furosemide (LASIX) 80 MG tablet Take 80 mg by mouth daily.    [provider]  gabapentin (NEURONTIN) 100 MG capsule Take 1 capsule (100 mg total) by mouth 3 (three) times daily as needed (neuropathy). 02/03/23   Carmina Miller, DO  hydrOXYzine (ATARAX) 25 MG tablet Take 25 mg by mouth.    [provider]  insulin glargine (LANTUS SOLOSTAR) 100 UNIT/ML Solostar Pen Inject 3 Units into the skin daily.    [provider]  insulin lispro (HUMALOG) 100 UNIT/ML KwikPen Inject 0-6 Units into the skin 3 (three) times daily with meals. Check Blood Glucose (BG) and inject per scale: BG <150= 0 unit; BG 150-200= 1 unit; BG 201-250= 2 unit; BG 251-300= 3 unit; BG 301-350= 4 unit; BG 351-400= 5 unit; BG >400= 6 unit and Call Primary Care. 02/03/23   Carmina Miller, DO  labetalol (NORMODYNE) 100 MG tablet Take 1 tablet (100 mg total) by mouth 2 (two) times daily. 02/03/23   Carmina Miller, DO  lidocaine-prilocaine (EMLA) cream Apply 1 Application topically as needed (For port access). 02/03/23   Carmina Miller, DO  lipase/protease/amylase (CREON) 36000 UNITS CPEP capsule Take 2 capsules (72,000 Units total) by mouth  3 (three) times daily with meals. May also take 1 capsule (36,000 Units total) as needed (with snacks - up to 4 snacks daily). 02/14/23   Carmina Miller, DO  loperamide (IMODIUM) 2 MG capsule Take 2 capsules (4 mg total) by mouth as needed for diarrhea or loose stools. Not to exceed 8 caps per day  02/03/23   Carmina Miller, DO  methocarbamol (ROBAXIN) 750 MG tablet Take 1 tablet (750 mg total) by mouth every 8 (eight) hours as needed for muscle spasms. 02/03/23   Carmina Miller, DO  omeprazole (PRILOSEC) 20 MG capsule Take 1 capsule (20 mg total) by mouth daily as needed (acid reflux/heartburn). 02/03/23   Carmina Miller, DO  oxyCODONE-acetaminophen (PERCOCET) 10-325 MG tablet Take 1 tablet by mouth every 6 (six) hours as needed for pain.    [provider]  rosuvastatin (CRESTOR) 10 MG tablet Take 1 tablet (10 mg total) by mouth daily. 02/04/23   Carmina Miller, DO  Acidophilus Lactobacillus CAPS Take 1 capsule by mouth 3 (three) times daily with meals. 02/03/23   Carmina Miller, DO                                                                                                                                    Past Surgical History Past Surgical History:  Procedure Laterality Date   AV FISTULA PLACEMENT Left 06/19/2021   Procedure: INSERTION OF ARTERIOVENOUS (AV) GORE-TEX GRAFT ARM;  Surgeon: Chuck Hint, MD;  Location: [facility] OR;  Service: Vascular;  Laterality: Left;   CESAREAN SECTION     CESAREAN SECTION N/A 07/05/2013   Procedure: CESAREAN SECTION;  Surgeon: Allie Bossier, MD;  Location: [facility] ORS;  Service: Obstetrics;  Laterality: N/A;   CESAREAN SECTION N/A 07/25/2014   Procedure: CESAREAN SECTION;  Surgeon: Reva Bores, MD;  Location: [facility] ORS;  Service: Obstetrics;  Laterality: N/A;   CESAREAN SECTION N/A 11/21/2015   Procedure: CESAREAN SECTION;  Surgeon: Reva Bores, MD;  Location: [facility] BIRTHING SU[facility];  Service: Obstetrics;  Laterality: N/A;   IR FLUORO GUIDE CV LINE RIGHT  06/16/2021   IR US GUIDE VASC ACCESS RIGHT  06/16/2021   WISDOM TOOTH EXTRACTION     Family History Family History  Problem Relation Age of Onset   Stroke Mother    Hypertension Mother    Heart disease Mother    Kidney disease Mother    Hyperlipidemia Father    Diabetes Father     Social  History Social History   Tobacco Use   Smoking status: Former    Current packs/day: 0.25    Average packs/day: 0.3 packs/day for 9.0 years (2.3 ttl pk-yrs)    Types: Cigarettes   Smokeless tobacco: Never  Substance Use Topics   Alcohol use: Not Currently    Comment: occasional   Drug use: No   Allergies Dilaudid [hydromorphone]  Review of Systems Review of Systems  Constitutional:  Positive for fatigue. Negative for chills and fever.  Respiratory:  Negative for cough, chest tightness and shortness of breath.   Cardiovascular:  Positive for leg swelling. Negative for chest pain and palpitations.  Gastrointestinal:  Positive for abdominal pain, nausea and vomiting. Negative for diarrhea.  Genitourinary:  Negative for pelvic pain.  Skin:  Negative for rash.  Neurological:  Negative for syncope.  All other systems reviewed and are negative.   Physical Exam Vital Signs  I have reviewed the triage vital signs BP (!) 183/108   Pulse (!) 105   Temp 97.7 F (36.5 C)   Resp 16   Ht 5\' 5"  (1.651 m)   Wt 49.9 kg   LMP  (LMP Unknown)   SpO2 98%   BMI 18.30 kg/m  Physical Exam Vitals and nursing note reviewed.  Constitutional:      General: She is not in acute distress.    Appearance: Normal appearance. She is well-developed. She is not ill-appearing.  HENT:     Head: Normocephalic and atraumatic.     Right Ear: External ear normal.     Left Ear: External ear normal.     Nose: Nose normal.     Mouth/Throat:     Mouth: Mucous membranes are dry.  Eyes:     General: No scleral icterus.       Right eye: No discharge.        Left eye: No discharge.  Cardiovascular:     Rate and Rhythm: Normal rate.  Pulmonary:     Effort: Pulmonary effort is normal. No respiratory distress.     Breath sounds: No stridor.  Abdominal:     General: Abdomen is flat. There is distension.     Palpations: Abdomen is soft.     Tenderness: There is generalized abdominal tenderness. There is no  guarding.  Musculoskeletal:        General: No deformity.     Cervical back: No rigidity.     Right lower leg: Edema present.     Left lower leg: Edema present.     Comments: Pedal edema symmetric b/l  Skin:    General: Skin is warm and dry.     Coloration: Skin is not cyanotic, jaundiced or pale.  Neurological:     Mental Status: She is alert and oriented to person, place, and time.     GCS: GCS eye subscore is 4. GCS verbal subscore is 5. GCS motor subscore is 6.  Psychiatric:        Speech: Speech normal.        Behavior: Behavior normal. Behavior is cooperative.     ED Results and Treatments Labs (all labs ordered are listed, but only abnormal results are displayed) Labs Reviewed  CBC WITH DIFFERENTIAL/PLATELET - Abnormal; Notable for the following components:      Result Value   RBC 3.63 (*)    Hemoglobin 10.2 (*)    HCT 32.0 (*)    Platelets 106 (*)    All other components within normal limits  BETA-HYDROXYBUTYRIC ACID - Abnormal; Notable for the following components:   Beta-Hydroxybutyric Acid 1.06 (*)    All other components within normal limits  COMPREHENSIVE METABOLIC PANEL - Abnormal; Notable for the following components:   Sodium 122 (*)    Chloride 87 (*)    CO2 19 (*)    Glucose, Bld 909 (*)    BUN 57 (*)    Creatinine, Ser 6.58 (*)    Calcium  7.9 (*)    Total Protein 6.2 (*)    Albumin 2.6 (*)    Alkaline Phosphatase 134 (*)    Total Bilirubin 1.4 (*)    GFR, Estimated 8 (*)    Anion gap 16 (*)    All other components within normal limits  CBG MONITORING, ED - Abnormal; Notable for the following components:   Glucose-Capillary >600 (*)    All other components within normal limits  I-STAT VENOUS BLOOD GAS, ED - Abnormal; Notable for the following components:   pCO2, Ven 40.0 (*)    pO2, Ven 53 (*)    Sodium 120 (*)    Potassium 6.5 (*)    Calcium, Ion 0.87 (*)    All other components within normal limits  I-STAT CHEM 8, ED - Abnormal; Notable  for the following components:   Sodium 120 (*)    Potassium 6.6 (*)    Chloride 89 (*)    BUN 83 (*)    Creatinine, Ser 6.80 (*)    Glucose, Bld >700 (*)    Calcium, Ion 0.87 (*)    All other components within normal limits  CBG MONITORING, ED - Abnormal; Notable for the following components:   Glucose-Capillary >600 (*)    All other components within normal limits  CBG MONITORING, ED - Abnormal; Notable for the following components:   Glucose-Capillary >600 (*)    All other components within normal limits  CBG MONITORING, ED - Abnormal; Notable for the following components:   Glucose-Capillary >600 (*)    All other components within normal limits  CBG MONITORING, ED - Abnormal; Notable for the following components:   Glucose-Capillary >600 (*)    All other components within normal limits  RESP PANEL BY RT-PCR (RSV, FLU A&B, COVID)  [id number]  BODY FLUID CULTURE W GRAM STAIN  LIPASE, BLOOD  URINALYSIS, ROUTINE W REFLEX MICROSCOPIC  HCG, SERUM, QUALITATIVE  LACTATE DEHYDROGENASE, PLEURAL OR PERITONEAL FLUID  GLUCOSE, PLEURAL OR PERITONEAL FLUID  PROTEIN, PLEURAL OR PERITONEAL FLUID  ALBUMIN, PLEURAL OR PERITONEAL FLUID   BODY FLUID CELL COUNT WITH DIFFERENTIAL  BASIC METABOLIC PANEL                                                                                                                          Radiology CT ABDOMEN PELVIS W CONTRAST Result Date: 05/30/2023 CLINICAL DATA:  Abdominal pain and swelling and swelling in the feet. History of end-stage renal failure and diabetes. EXAM: CT ABDOMEN AND PELVIS WITH CONTRAST TECHNIQUE: Multidetector CT imaging of the abdomen and pelvis was performed using the standard protocol following bolus administration of intravenous contrast. RADIATION DOSE REDUCTION: This exam was performed according to the departmental dose-optimization program which includes automated exposure control, adjustment of the mA and/or kV according to patient size  and/or use of iterative reconstruction technique. CONTRAST:  65mL OMNIPAQUE IOHEXOL 350 MG/ML SOLN COMPARISON:  CT without contrast 04/19/2021. FINDINGS: Lower chest: There are small right and minimal  left layering pleural effusions, with atelectatic changes in both bases. There is cardiomegaly with a right chamber predominance, likely indicating chronic right heart dysfunction and also evidenced by IVC and hepatic vein contrast reflux. There are coronary artery calcifications, age-advanced. Hepatobiliary: Loss of fine detail owing to massive ascites, body wall anasarca and artifact from overlying wires and patient's arms in the field. The liver is mildly steatotic. No obvious mass is seen and no calcified gallstones or gallbladder thickening. No biliary dilatation. Pancreas: Atrophic and otherwise unremarkable. 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 No mass enhancement. No splenomegaly. Adrenals/Urinary Tract: There is bilateral renal cortical thinning. No adrenal mass or renal mass enhancement. There is air in both collecting systems which could be due to recent instrumentation or infectious process. Air in the bladder is also noted. Bladder wall is nondistended. There is no evidence of vesicoenteric fistula. Stomach/Bowel: There is mucosal enhancement of the stomach and proximal small bowel consistent with gastroenteritis. No dilatation or wall thickening in the stomach and small bowel. The appendix is normal. There is either wall thickening or nondistention in the ascending, transverse and descending colon. Scattered sigmoid diverticula. Correlate clinically for underlying colitis. Unable to tell if there is adjacent inflammatory change due to massive ascites. Vascular/Lymphatic: Age-advanced aortic and branch vessel atherosclerosis. No AAA. No adenopathy is seen through the ascites. Reproductive: The uterus is intact. The ovaries are not enlarged. There are bilateral BTL clips, seen  previously. Other: massive ascites and diffuse body wall anasarca. No free air. No free air hemorrhage or localizing collections. The ascites displaces the bowel inward and extends up around the liver and spleen. There is no incarcerated hernia. Musculoskeletal: No acute or significant osseous findings. IMPRESSION: 1. Massive ascites and diffuse body wall anasarca. 2. Small right and minimal left pleural effusions. Basilar atelectasis. 3. Cardiomegaly with right chamber predominance, likely indicating chronic right heart dysfunction. 4. Age-advanced atherosclerosis. 5. Air in the renal collecting systems and bladder which could be due to recent instrumentation or infectious process. 6. Mucosal enhancement in the stomach and proximal small bowel consistent with gastroenteritis. 7. Wall thickening versus nondistention in the ascending, transverse and descending colon. Correlate clinically for underlying colitis. 8. Diverticulosis. 9. Aortic atherosclerosis. Aortic Atherosclerosis (ICD10-I70.0). Electronically Signed   By: Almira Bar M.D.   On: 05/30/2023 02:25   DG Chest Portable 1 View Result Date: 05/30/2023 CLINICAL DATA:  Abdominal pain with extremity swelling. Check volume status. EXAM: PORTABLE CHEST 1 VIEW COMPARISON:  Portable chest 02/02/2023 FINDINGS: There is a small right pleural effusion which was seen previously, with overlying atelectasis. Low inspiration on exam. There is increased left perihilar linear atelectasis. The hypoinflated lungs otherwise appear clear. There is no substantial left effusion. The heart silhouette is enlarged but probably exaggerated in part due to low inspiration. There is mild central vascular prominence which could be due to low lung volumes or mild perihilar vascular congestion. No edema is seen. The mediastinum is normally outlined.  The thoracic cage is intact. IMPRESSION: 1. Low inspiration on exam. 2. Small right pleural effusion with overlying atelectasis. 3.  Increased left perihilar linear atelectasis. No visible infiltrate. 4. Cardiomegaly. 5. Mild central vascular prominence which could be due to low lung volumes or mild perihilar vascular congestion. No edema is seen. Electronically Signed   By: Almira Bar M.D.   On: 05/30/2023 00:41    Pertinent labs & imaging results that were available during my care of the patient were reviewed by  me and considered in my medical decision making (see MDM for details).  Medications Ordered in ED Medications  sodium zirconium cyclosilicate (LOKELMA) packet 10 g (0 g Oral Hold 05/30/23 0313)  insulin regular, human (MYXREDLIN) 100 units/ 100 mL infusion (4.4 Units/hr Intravenous New Bag/Given 05/30/23 0347)  lactated ringers infusion ( Intravenous New Bag/Given 05/30/23 0345)  dextrose 5 % in lactated ringers infusion (0 mLs Intravenous Hold 05/30/23 0332)  dextrose 50 % solution 0-50 mL (has no administration in time range)  cefTRIAXone (ROCEPHIN) 1 g in sodium chloride 0.9 % 100 mL IVPB (1 g Intravenous New Bag/Given 05/30/23 0502)  sodium chloride 0.9 % bolus 500 mL (0 mLs Intravenous Stopped 05/30/23 0228)  ondansetron (ZOFRAN) injection 4 mg (4 mg Intravenous Given 05/30/23 0045)  calcium gluconate 1 g/ 50 mL sodium chloride IVPB (0 mg Intravenous Stopped 05/30/23 0314)  insulin aspart (novoLOG) injection 10 Units (10 Units Intravenous Given 05/30/23 0311)  iohexol (OMNIPAQUE) 350 MG/ML injection 65 mL (65 mLs Intravenous Contrast Given 05/30/23 0156)  ondansetron (ZOFRAN) injection 4 mg (4 mg Intravenous Given 05/30/23 0314)  potassium chloride 10 mEq in 100 mL IVPB (0 mEq Intravenous Stopped 05/30/23 0500)                                                                                                                                     Procedures ABDOMINAL PARACENTESIS  Date/Time: 05/30/2023 5:02 AM  Performed by: Sloan Leiter, DO Authorized by: Sloan Leiter, DO  Consent: Verbal consent  obtained. Written consent obtained. Risks and benefits: risks, benefits and alternatives were discussed Consent given by: patient Patient understanding: patient states understanding of the procedure being performed Imaging studies: imaging studies available Required items: required blood products, implants, devices, and special equipment available Patient identity confirmed: verbally with patient and arm band Time out: Immediately prior to procedure a "time out" was called to verify the correct patient, procedure, equipment, support staff and site/side marked as required. Preparation: Patient was prepped and draped in the usual sterile fashion. Local anesthesia used: yes Anesthesia: local infiltration  Anesthesia: Local anesthesia used: yes Local Anesthetic: lidocaine 1% with epinephrine Anesthetic total: 5 mL  Sedation: Patient sedated: no  Patient tolerance: patient tolerated the procedure well with no immediate complications   .Critical Care  Performed by: Sloan Leiter, DO Authorized by: Sloan Leiter, DO   Critical care provider statement:    Critical care time (minutes):  30   Critical care time was exclusive of:  Separately billable procedures and treating other patients   Critical care was necessary to treat or prevent imminent or life-threatening deterioration of the following conditions:  Metabolic crisis   Critical care was time spent personally by me on the following activities:  Development of treatment plan with patient or surrogate, discussions with consultants, evaluation of patient's response to treatment, examination of patient, ordering and review of laboratory studies, ordering  and review of radiographic studies, ordering and performing treatments and interventions, pulse oximetry, re-evaluation of patient's condition, review of old charts and obtaining history from patient or surrogate   Care discussed with: admitting provider     (including critical care  time)  Emergency Ultrasound Study:   Angiocath insertion Performed by: Sloan Leiter  Consent: Verbal consent obtained. Risks and benefits: risks, benefits and alternatives were discussed Immediately prior to procedure the correct patient, procedure, equipment, support staff and site/side marked as needed.  Indication: difficult IV access Preparation: Patient was prepped and draped in the usual sterile fashion. Vein Location: The median cubital vein was visualized during assessment for potential access sites and was found to be patent/ easily compressed with linear ultrasound.  The needle was visualized with real-time ultrasound and guided into the vein. Gauge: 20  Images were not saved due to the time sensitive nature of the situation. Normal blood return.  Patient tolerance: Patient tolerated the procedure well with no immediate complications.   Medical Decision Making / ED Course    Medical Decision Making:    Ann Robertson is a 32 y.o. female  with past medical history as below, significant for ESRD on HD Monday Wednesday Friday, hyperkalemia HLD, who presents to the ED with complaint of abdominal pain, swelling. The complaint involves an extensive differential diagnosis and also carries with it a high risk of complications and morbidity.  Serious etiology was considered. Ddx includes but is not limited to: Differential diagnosis includes but is not exclusive to ectopic pregnancy, ovarian cyst, ovarian torsion, acute appendicitis, urinary tract infection, endometriosis, bowel obstruction, hernia, colitis, renal colic, gastroenteritis, volvulus etc.   Complete initial physical exam performed, notably the patient was in no distress, no hypoxia, abd not peritoneal.    Reviewed and confirmed nursing documentation for past medical history, family history, social history.  Vital signs reviewed.      Clinical Course as of 05/30/23 0509  Mon May 30, 2023  0114 Delay in labs  2/2 poor IV access, I placed US guided PIV and was able to get blood [SG]  0134 Potassium(!!): 6.6 No EKG changes, has missed at least 2 HD sessions this week [SG]  0134 Sodium(!): 120 Corrected HypoNa given hyperglycemia is 134 [SG]  0312 CTAP: "IMPRESSION: 1. Massive ascites and diffuse body wall anasarca. 2. Small right and minimal left pleural effusions. Basilar atelectasis. 3. Cardiomegaly with right chamber predominance, likely indicating chronic right heart dysfunction. 4. Age-advanced atherosclerosis. 5. Air in the renal collecting systems and bladder which could be due to recent instrumentation or infectious process. 6. Mucosal enhancement in the stomach and proximal small bowel consistent with gastroenteritis. 7. Wall thickening versus nondistention in the ascending, transverse and descending colon. Correlate clinically for underlying colitis. 8. Diverticulosis. 9. Aortic atherosclerosis." [SG]  0322 Potassium: 4.4 K has normalized, glucose is >900, start endotool. Does not appear to have DKA as pH is 7.387, HHS unlikely given normal mental status [SG]  0323 Does not appear to require emergent HD this evening, recommend engaging nephro in the AM to arrange for regular HD [SG]  0409 She has large volume ascites, some abd pain. Imaging concerning for gastroenteritis/colitis, will get paracentesis to r/o sbp, although this seems less likely at this point  [SG]  0451 Paracentesis completed w/o complication, tolerated well [SG]    Clinical Course User Index [SG] Sloan Leiter, DO    Brief summary: 32 year old female history of ESRD on HD, IDDM here with  abdominal pain, nausea vomiting, high glucose.  Compliance with dialysis over the past week, noncompliant with her insulin today.  Blood glucose in triage >600.  She has hyperglycemic crisis, does not appear to have DKA or HHS.  Endo tool started.  She initially was hyperkalemic but this has since resolved.  CT imaging  revealing for large volume ascites, likely gastroenteritis and colitis.  This is likely source of her nausea and vomiting.  She has mild abdominal pain, no fever.  Paracentesis was completed.  CT imaging concerning for possible urinary infection.  Started on empiric Rocephin which will also cover empirically for SBP. She appears somewhat volume overloaded, no hypoxia, lytes stable, does not appear to need emergent HD but will likely need in next 24 hours.   Intermittently agitated, initially refusing admission but now she is amenable.  Admitted [facility], Dr Arlean Hopping                 Additional history obtained: -Additional history obtained from NA -External records from outside source obtained and reviewed including: Chart review including previous notes, labs, imaging, consultation notes including  Prior admission Home meds Prior labs   Lab Tests: -I ordered, reviewed, and interpreted labs.   The pertinent results include:   Labs Reviewed  CBC WITH DIFFERENTIAL/PLATELET - Abnormal; Notable for the following components:      Result Value   RBC 3.63 (*)    Hemoglobin 10.2 (*)    HCT 32.0 (*)    Platelets 106 (*)    All other components within normal limits  BETA-HYDROXYBUTYRIC ACID - Abnormal; Notable for the following components:   Beta-Hydroxybutyric Acid 1.06 (*)    All other components within normal limits  COMPREHENSIVE METABOLIC PANEL - Abnormal; Notable for the following components:   Sodium 122 (*)    Chloride 87 (*)    CO2 19 (*)    Glucose, Bld 909 (*)    BUN 57 (*)    Creatinine, Ser 6.58 (*)    Calcium 7.9 (*)    Total Protein 6.2 (*)    Albumin 2.6 (*)    Alkaline Phosphatase 134 (*)    Total Bilirubin 1.4 (*)    GFR, Estimated 8 (*)    Anion gap 16 (*)    All other components within normal limits  CBG MONITORING, ED - Abnormal; Notable for the following components:   Glucose-Capillary >600 (*)    All other components within normal limits  I-STAT  VENOUS BLOOD GAS, ED - Abnormal; Notable for the following components:   pCO2, Ven 40.0 (*)    pO2, Ven 53 (*)    Sodium 120 (*)    Potassium 6.5 (*)    Calcium, Ion 0.87 (*)    All other components within normal limits  I-STAT CHEM 8, ED - Abnormal; Notable for the following components:   Sodium 120 (*)    Potassium 6.6 (*)    Chloride 89 (*)    BUN 83 (*)    Creatinine, Ser 6.80 (*)    Glucose, Bld >700 (*)    Calcium, Ion 0.87 (*)    All other components within normal limits  CBG MONITORING, ED - Abnormal; Notable for the following components:   Glucose-Capillary >600 (*)    All other components within normal limits  CBG MONITORING, ED - Abnormal; Notable for the following components:   Glucose-Capillary >600 (*)    All other components within normal limits  CBG MONITORING, ED - Abnormal; Notable for  the following components:   Glucose-Capillary >600 (*)    All other components within normal limits  CBG MONITORING, ED - Abnormal; Notable for the following components:   Glucose-Capillary >600 (*)    All other components within normal limits  RESP PANEL BY RT-PCR (RSV, FLU A&B, COVID)  [id number]  BODY FLUID CULTURE W GRAM STAIN  LIPASE, BLOOD  URINALYSIS, ROUTINE W REFLEX MICROSCOPIC  HCG, SERUM, QUALITATIVE  LACTATE DEHYDROGENASE, PLEURAL OR PERITONEAL FLUID  GLUCOSE, PLEURAL OR PERITONEAL FLUID  PROTEIN, PLEURAL OR PERITONEAL FLUID  ALBUMIN, PLEURAL OR PERITONEAL FLUID   BODY FLUID CELL COUNT WITH DIFFERENTIAL  BASIC METABOLIC PANEL    Notable for hyperglycemia, mild anemia, Cr/BUN elev c/w ESRD on HD, K initial elev   EKG   EKG Interpretation Date/Time:  Monday May 30 2023 00:56:26 EST Ventricular Rate:  98 PR Interval:  179 QRS Duration:  107 QT Interval:  373 QTC Calculation: 477 R Axis:   145  Text Interpretation: Sinus rhythm Low voltage, precordial leads Probable right ventricular hypertrophy Borderline prolonged QT interval Confirmed by Tanda Rockers  ([id number]) on 05/30/2023 1:32:39 AM         Imaging Studies ordered: I ordered imaging studies including ctap cxr I independently visualized the following imaging with scope of interpretation limited to determining acute life threatening conditions related to emergency care; findings noted above I independently visualized and interpreted imaging. I agree with the radiologist interpretation   Medicines ordered and prescription drug management: Meds ordered this encounter  Medications   sodium chloride 0.9 % bolus 500 mL   ondansetron (ZOFRAN) injection 4 mg   sodium zirconium cyclosilicate (LOKELMA) packet 10 g   calcium gluconate 1 g/ 50 mL sodium chloride IVPB   insulin aspart (novoLOG) injection 10 Units   iohexol (OMNIPAQUE) 350 MG/ML injection 65 mL   ondansetron (ZOFRAN) injection 4 mg   insulin regular, human (MYXREDLIN) 100 units/ 100 mL infusion    EndoTool low target::   140    EndoTool high target::   180    Type of Diabetes:   Type 1    Mode of Therapy:   Hyperglycemia    Start Method:   EndoTool to calculate   lactated ringers infusion   dextrose 5 % in lactated ringers infusion   dextrose 50 % solution 0-50 mL   potassium chloride 10 mEq in 100 mL IVPB   cefTRIAXone (ROCEPHIN) 1 g in sodium chloride 0.9 % 100 mL IVPB    Antibiotic Indication::   Intra-abdominal   DISCONTD: lidocaine-EPINEPHrine (XYLOCAINE W/EPI) 1 %-1:100000 (with pres) injection 20 mL    -I have reviewed the patients home medicines and have made adjustments as needed   Consultations Obtained: na   Cardiac Monitoring: The patient was maintained on a cardiac monitor.  I personally viewed and interpreted the cardiac monitored which showed an underlying rhythm of: NSR Continuous pulse oximetry interpreted by myself, 99% on RA.    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    Final Clinical Impression(s) / ED Diagnoses Final diagnoses:  Hyperglycemia  Patient's noncompliance with other medical treatment and regimen due to unspecified reason  Gastroenteritis  Nausea and vomiting, unspecified vomiting type        Sloan Leiter, DO 05/30/23 0510

## 2023-05-30 ENCOUNTER — Inpatient Hospital Stay (HOSPITAL_COMMUNITY): Payer: Medicare HMO

## 2023-05-30 ENCOUNTER — Emergency Department (HOSPITAL_COMMUNITY): Payer: Medicare HMO

## 2023-05-30 ENCOUNTER — Encounter (HOSPITAL_COMMUNITY): Payer: Self-pay | Admitting: Internal Medicine

## 2023-05-30 DIAGNOSIS — R739 Hyperglycemia, unspecified: Principal | ICD-10-CM

## 2023-05-30 DIAGNOSIS — I10 Essential (primary) hypertension: Secondary | ICD-10-CM

## 2023-05-30 DIAGNOSIS — Z992 Dependence on renal dialysis: Secondary | ICD-10-CM

## 2023-05-30 DIAGNOSIS — E1022 Type 1 diabetes mellitus with diabetic chronic kidney disease: Secondary | ICD-10-CM | POA: Diagnosis present

## 2023-05-30 DIAGNOSIS — R112 Nausea with vomiting, unspecified: Secondary | ICD-10-CM

## 2023-05-30 DIAGNOSIS — Z862 Personal history of diseases of the blood and blood-forming organs and certain disorders involving the immune mechanism: Secondary | ICD-10-CM

## 2023-05-30 DIAGNOSIS — D696 Thrombocytopenia, unspecified: Secondary | ICD-10-CM | POA: Diagnosis present

## 2023-05-30 DIAGNOSIS — K529 Noninfective gastroenteritis and colitis, unspecified: Secondary | ICD-10-CM | POA: Diagnosis present

## 2023-05-30 DIAGNOSIS — E1065 Type 1 diabetes mellitus with hyperglycemia: Secondary | ICD-10-CM | POA: Diagnosis present

## 2023-05-30 DIAGNOSIS — Z794 Long term (current) use of insulin: Secondary | ICD-10-CM | POA: Diagnosis not present

## 2023-05-30 DIAGNOSIS — D631 Anemia in chronic kidney disease: Secondary | ICD-10-CM | POA: Diagnosis present

## 2023-05-30 DIAGNOSIS — E109 Type 1 diabetes mellitus without complications: Secondary | ICD-10-CM

## 2023-05-30 DIAGNOSIS — Z1152 Encounter for screening for COVID-19: Secondary | ICD-10-CM | POA: Diagnosis not present

## 2023-05-30 DIAGNOSIS — Z8249 Family history of ischemic heart disease and other diseases of the circulatory system: Secondary | ICD-10-CM | POA: Diagnosis not present

## 2023-05-30 DIAGNOSIS — E7800 Pure hypercholesterolemia, unspecified: Secondary | ICD-10-CM | POA: Diagnosis present

## 2023-05-30 DIAGNOSIS — M898X9 Other specified disorders of bone, unspecified site: Secondary | ICD-10-CM | POA: Diagnosis present

## 2023-05-30 DIAGNOSIS — I120 Hypertensive chronic kidney disease with stage 5 chronic kidney disease or end stage renal disease: Secondary | ICD-10-CM | POA: Diagnosis present

## 2023-05-30 DIAGNOSIS — Z823 Family history of stroke: Secondary | ICD-10-CM | POA: Diagnosis not present

## 2023-05-30 DIAGNOSIS — L89896 Pressure-induced deep tissue damage of other site: Secondary | ICD-10-CM | POA: Diagnosis present

## 2023-05-30 DIAGNOSIS — R188 Other ascites: Secondary | ICD-10-CM | POA: Diagnosis present

## 2023-05-30 DIAGNOSIS — N186 End stage renal disease: Secondary | ICD-10-CM

## 2023-05-30 DIAGNOSIS — E860 Dehydration: Secondary | ICD-10-CM | POA: Diagnosis present

## 2023-05-30 DIAGNOSIS — E871 Hypo-osmolality and hyponatremia: Secondary | ICD-10-CM | POA: Diagnosis present

## 2023-05-30 DIAGNOSIS — E8779 Other fluid overload: Secondary | ICD-10-CM | POA: Diagnosis present

## 2023-05-30 DIAGNOSIS — E10649 Type 1 diabetes mellitus with hypoglycemia without coma: Secondary | ICD-10-CM | POA: Diagnosis not present

## 2023-05-30 DIAGNOSIS — N2581 Secondary hyperparathyroidism of renal origin: Secondary | ICD-10-CM | POA: Diagnosis present

## 2023-05-30 DIAGNOSIS — I361 Nonrheumatic tricuspid (valve) insufficiency: Secondary | ICD-10-CM

## 2023-05-30 DIAGNOSIS — T383X6A Underdosing of insulin and oral hypoglycemic [antidiabetic] drugs, initial encounter: Secondary | ICD-10-CM | POA: Diagnosis present

## 2023-05-30 DIAGNOSIS — N189 Chronic kidney disease, unspecified: Secondary | ICD-10-CM

## 2023-05-30 DIAGNOSIS — Z91158 Patient's noncompliance with renal dialysis for other reason: Secondary | ICD-10-CM | POA: Diagnosis not present

## 2023-05-30 DIAGNOSIS — E782 Mixed hyperlipidemia: Secondary | ICD-10-CM

## 2023-05-30 DIAGNOSIS — F17210 Nicotine dependence, cigarettes, uncomplicated: Secondary | ICD-10-CM | POA: Diagnosis present

## 2023-05-30 LAB — CBG MONITORING, ED
Glucose-Capillary: >600 mg/dL (ref 70–99)
Glucose-Capillary: >600 mg/dL (ref 70–99)
Glucose-Capillary: >600 mg/dL (ref 70–99)
Glucose-Capillary: >600 mg/dL (ref 70–99)
Glucose-Capillary: 585 mg/dL (ref 70–99)
Glucose-Capillary: 542 mg/dL (ref 70–99)
Glucose-Capillary: >600 mg/dL (ref 70–99)
Glucose-Capillary: 529 mg/dL (ref 70–99)
Glucose-Capillary: 533 mg/dL (ref 70–99)
Glucose-Capillary: 427 mg/dL — ABNORMAL HIGH (ref 70–99)
Glucose-Capillary: 455 mg/dL — ABNORMAL HIGH (ref 70–99)
Glucose-Capillary: 406 mg/dL — ABNORMAL HIGH (ref 70–99)
Glucose-Capillary: 432 mg/dL — ABNORMAL HIGH (ref 70–99)
Glucose-Capillary: 432 mg/dL — ABNORMAL HIGH (ref 70–99)
Glucose-Capillary: 311 mg/dL — ABNORMAL HIGH (ref 70–99)
Glucose-Capillary: 256 mg/dL — ABNORMAL HIGH (ref 70–99)
Glucose-Capillary: 185 mg/dL — ABNORMAL HIGH (ref 70–99)
Glucose-Capillary: 144 mg/dL — ABNORMAL HIGH (ref 70–99)
Glucose-Capillary: 75 mg/dL (ref 70–99)

## 2023-05-30 LAB — BLOOD GAS, VENOUS
Acid-base deficit: 18.1 mmol/L — ABNORMAL HIGH (ref 0.0–2.0)
Bicarbonate: 8.9 mmol/L — ABNORMAL LOW (ref 20.0–28.0)
O2 Saturation: 61.5 %
Patient temperature: 37
pCO2, Ven: 25 mm[Hg] — ABNORMAL LOW (ref 44–60)
pH, Ven: 7.16 — CL (ref 7.25–7.43)
pO2, Ven: 34 mm[Hg] (ref 32–45)

## 2023-05-30 LAB — BASIC METABOLIC PANEL
Anion gap: 18 — ABNORMAL HIGH (ref 5–15)
Anion gap: 18 — ABNORMAL HIGH (ref 5–15)
BUN: 57 mg/dL — ABNORMAL HIGH (ref 6–20)
BUN: 56 mg/dL — ABNORMAL HIGH (ref 6–20)
CO2: 19 mmol/L — ABNORMAL LOW (ref 22–32)
CO2: 18 mmol/L — ABNORMAL LOW (ref 22–32)
Calcium: 8.6 mg/dL — ABNORMAL LOW (ref 8.9–10.3)
Calcium: 8.1 mg/dL — ABNORMAL LOW (ref 8.9–10.3)
Chloride: 90 mmol/L — ABNORMAL LOW (ref 98–111)
Chloride: 93 mmol/L — ABNORMAL LOW (ref 98–111)
Creatinine, Ser: 6.51 mg/dL — ABNORMAL HIGH (ref 0.44–1.00)
Creatinine, Ser: 6.13 mg/dL — ABNORMAL HIGH (ref 0.44–1.00)
GFR, Estimated: 8 mL/min — ABNORMAL LOW (ref >60)
GFR, Estimated: 9 mL/min — ABNORMAL LOW (ref >60)
Glucose, Bld: 592 mg/dL (ref 70–99)
Glucose, Bld: 272 mg/dL — ABNORMAL HIGH (ref 70–99)
Potassium: 3.7 mmol/L (ref 3.5–5.1)
Potassium: 3.4 mmol/L — ABNORMAL LOW (ref 3.5–5.1)
Sodium: 127 mmol/L — ABNORMAL LOW (ref 135–145)
Sodium: 129 mmol/L — ABNORMAL LOW (ref 135–145)

## 2023-05-30 LAB — I-STAT VENOUS BLOOD GAS, ED
Acid-base deficit: 1 mmol/L (ref 0.0–2.0)
Acid-base deficit: 3 mmol/L — ABNORMAL HIGH (ref 0.0–2.0)
Bicarbonate: 24 mmol/L (ref 20.0–28.0)
Bicarbonate: 25.1 mmol/L (ref 20.0–28.0)
Calcium, Ion: 0.87 mmol/L — CL (ref 1.15–1.40)
Calcium, Ion: 1.09 mmol/L — ABNORMAL LOW (ref 1.15–1.40)
HCT: 38 % (ref 36.0–46.0)
HCT: 40 % (ref 36.0–46.0)
Hemoglobin: 12.9 g/dL (ref 12.0–15.0)
Hemoglobin: 13.6 g/dL (ref 12.0–15.0)
O2 Saturation: 87 %
O2 Saturation: 54 %
Potassium: 6.5 mmol/L (ref 3.5–5.1)
Potassium: 3.3 mmol/L — ABNORMAL LOW (ref 3.5–5.1)
Sodium: 120 mmol/L — ABNORMAL LOW (ref 135–145)
Sodium: 130 mmol/L — ABNORMAL LOW (ref 135–145)
TCO2: 25 mmol/L (ref 22–32)
TCO2: 27 mmol/L (ref 22–32)
pCO2, Ven: 40 mm[Hg] — ABNORMAL LOW (ref 44–60)
pCO2, Ven: 54.3 mm[Hg] (ref 44–60)
pH, Ven: 7.387 (ref 7.25–7.43)
pH, Ven: 7.273 (ref 7.25–7.43)
pO2, Ven: 53 mm[Hg] — ABNORMAL HIGH (ref 32–45)
pO2, Ven: 33 mm[Hg] (ref 32–45)

## 2023-05-30 LAB — PROTEIN, PLEURAL OR PERITONEAL FLUID: Total protein, fluid: 4.4 g/dL

## 2023-05-30 LAB — LACTIC ACID, PLASMA
Lactic Acid, Venous: 5.4 mmol/L (ref 0.5–1.9)
Lactic Acid, Venous: 4.9 mmol/L (ref 0.5–1.9)

## 2023-05-30 LAB — COMPREHENSIVE METABOLIC PANEL
ALT: 10 U/L (ref 0–44)
AST: 15 U/L (ref 15–41)
Albumin: 2.6 g/dL — ABNORMAL LOW (ref 3.5–5.0)
Alkaline Phosphatase: 134 U/L — ABNORMAL HIGH (ref 38–126)
Anion gap: 16 — ABNORMAL HIGH (ref 5–15)
BUN: 57 mg/dL — ABNORMAL HIGH (ref 6–20)
CO2: 19 mmol/L — ABNORMAL LOW (ref 22–32)
Calcium: 7.9 mg/dL — ABNORMAL LOW (ref 8.9–10.3)
Chloride: 87 mmol/L — ABNORMAL LOW (ref 98–111)
Creatinine, Ser: 6.58 mg/dL — ABNORMAL HIGH (ref 0.44–1.00)
GFR, Estimated: 8 mL/min — ABNORMAL LOW (ref >60)
Glucose, Bld: 909 mg/dL (ref 70–99)
Potassium: 4.4 mmol/L (ref 3.5–5.1)
Sodium: 122 mmol/L — ABNORMAL LOW (ref 135–145)
Total Bilirubin: 1.4 mg/dL — ABNORMAL HIGH (ref <1.2)
Total Protein: 6.2 g/dL — ABNORMAL LOW (ref 6.5–8.1)

## 2023-05-30 LAB — URINALYSIS, ROUTINE W REFLEX MICROSCOPIC
Bilirubin Urine: NEGATIVE
Glucose, UA: >=500 mg/dL — AB
Ketones, ur: NEGATIVE mg/dL
Nitrite: NEGATIVE
Protein, ur: 100 mg/dL — AB
Specific Gravity, Urine: 1.014 (ref 1.005–1.030)
pH: 5 (ref 5.0–8.0)

## 2023-05-30 LAB — OSMOLALITY: Osmolality: 287 mosm/kg (ref 275–295)

## 2023-05-30 LAB — APTT: aPTT: 35 s (ref 24–36)

## 2023-05-30 LAB — BODY FLUID CELL COUNT WITH DIFFERENTIAL
Eos, Fluid: 0 %
Lymphs, Fluid: 22 %
Monocyte-Macrophage-Serous Fluid: 69 % (ref 50–90)
Neutrophil Count, Fluid: 9 % (ref 0–25)
Total Nucleated Cell Count, Fluid: 163 uL (ref 0–1000)

## 2023-05-30 LAB — CBC WITH DIFFERENTIAL/PLATELET
Abs Immature Granulocytes: 0.04 10*3/uL (ref 0.00–0.07)
Basophils Absolute: 0 10*3/uL (ref 0.0–0.1)
Basophils Relative: 0 %
Eosinophils Absolute: 0 10*3/uL (ref 0.0–0.5)
Eosinophils Relative: 0 %
HCT: 32 % — ABNORMAL LOW (ref 36.0–46.0)
Hemoglobin: 10.2 g/dL — ABNORMAL LOW (ref 12.0–15.0)
Immature Granulocytes: 1 %
Lymphocytes Relative: 16 %
Lymphs Abs: 1.1 10*3/uL (ref 0.7–4.0)
MCH: 28.1 pg (ref 26.0–34.0)
MCHC: 31.9 g/dL (ref 30.0–36.0)
MCV: 88.2 fL (ref 80.0–100.0)
Monocytes Absolute: 0.3 10*3/uL (ref 0.1–1.0)
Monocytes Relative: 4 %
Neutro Abs: 5.5 10*3/uL (ref 1.7–7.7)
Neutrophils Relative %: 79 %
Platelets: 106 10*3/uL — ABNORMAL LOW (ref 150–400)
RBC: 3.63 MIL/uL — ABNORMAL LOW (ref 3.87–5.11)
RDW: 15.3 % (ref 11.5–15.5)
WBC: 7.1 10*3/uL (ref 4.0–10.5)
nRBC: 0 % (ref 0.0–0.2)

## 2023-05-30 LAB — HCG, SERUM, QUALITATIVE: Preg, Serum: NEGATIVE

## 2023-05-30 LAB — GLUCOSE, CAPILLARY
Glucose-Capillary: 104 mg/dL — ABNORMAL HIGH (ref 70–99)
Glucose-Capillary: 63 mg/dL — ABNORMAL LOW (ref 70–99)
Glucose-Capillary: 74 mg/dL (ref 70–99)
Glucose-Capillary: 71 mg/dL (ref 70–99)
Glucose-Capillary: 54 mg/dL — ABNORMAL LOW (ref 70–99)
Glucose-Capillary: 63 mg/dL — ABNORMAL LOW (ref 70–99)
Glucose-Capillary: 109 mg/dL — ABNORMAL HIGH (ref 70–99)

## 2023-05-30 LAB — ECHOCARDIOGRAM COMPLETE
Area-P 1/2: 6.11 cm2
Calc EF: 60.3 %
Height: 65 in
S' Lateral: 2.9 cm
Single Plane A2C EF: 56.9 %
Single Plane A4C EF: 65.9 %
Weight: 1760 [oz_av]

## 2023-05-30 LAB — URINALYSIS, MICROSCOPIC (REFLEX)
RBC / HPF: >50 RBC/hpf (ref 0–5)
WBC, UA: >50 WBC/hpf (ref 0–5)

## 2023-05-30 LAB — ACETAMINOPHEN LEVEL: Acetaminophen (Tylenol), Serum: <10 ug/mL — ABNORMAL LOW (ref 10–30)

## 2023-05-30 LAB — I-STAT CHEM 8, ED
BUN: 83 mg/dL — ABNORMAL HIGH (ref 6–20)
Calcium, Ion: 0.87 mmol/L — CL (ref 1.15–1.40)
Chloride: 89 mmol/L — ABNORMAL LOW (ref 98–111)
Creatinine, Ser: 6.8 mg/dL — ABNORMAL HIGH (ref 0.44–1.00)
Glucose, Bld: >700 mg/dL (ref 70–99)
HCT: 38 % (ref 36.0–46.0)
Hemoglobin: 12.9 g/dL (ref 12.0–15.0)
Potassium: 6.6 mmol/L (ref 3.5–5.1)
Sodium: 120 mmol/L — ABNORMAL LOW (ref 135–145)
TCO2: 22 mmol/L (ref 22–32)

## 2023-05-30 LAB — LIPASE, BLOOD: Lipase: 22 U/L (ref 11–51)

## 2023-05-30 LAB — BETA-HYDROXYBUTYRIC ACID
Beta-Hydroxybutyric Acid: 1.06 mmol/L — ABNORMAL HIGH (ref 0.05–0.27)
Beta-Hydroxybutyric Acid: 0.17 mmol/L (ref 0.05–0.27)

## 2023-05-30 LAB — ETHANOL: Alcohol, Ethyl (B): <10 mg/dL (ref <10)

## 2023-05-30 LAB — HEPATITIS B SURFACE ANTIGEN: Hepatitis B Surface Ag: NONREACTIVE

## 2023-05-30 LAB — ALBUMIN, PLEURAL OR PERITONEAL FLUID: Albumin, Fluid: 2 g/dL

## 2023-05-30 LAB — PROCALCITONIN: Procalcitonin: 34.14 ng/mL

## 2023-05-30 LAB — PHOSPHORUS: Phosphorus: 4.9 mg/dL — ABNORMAL HIGH (ref 2.5–4.6)

## 2023-05-30 LAB — RESP PANEL BY RT-PCR (RSV, FLU A&B, COVID)  RVPGX2
Influenza A by PCR: NEGATIVE
Influenza B by PCR: NEGATIVE
Resp Syncytial Virus by PCR: NEGATIVE
SARS Coronavirus 2 by RT PCR: NEGATIVE

## 2023-05-30 LAB — GLUCOSE, PLEURAL OR PERITONEAL FLUID: Glucose, Fluid: 974 mg/dL

## 2023-05-30 LAB — PATHOLOGIST SMEAR REVIEW

## 2023-05-30 LAB — MAGNESIUM: Magnesium: 1.8 mg/dL (ref 1.7–2.4)

## 2023-05-30 LAB — LACTATE DEHYDROGENASE, PLEURAL OR PERITONEAL FLUID: LD, Fluid: 37 U/L — ABNORMAL HIGH (ref 3–23)

## 2023-05-30 LAB — PROTIME-INR
INR: 1.6 — ABNORMAL HIGH (ref 0.8–1.2)
Prothrombin Time: 19.7 s — ABNORMAL HIGH (ref 11.4–15.2)

## 2023-05-30 MED ORDER — LABETALOL HCL 100 MG PO TABS
100.0000 mg | ORAL_TABLET | Freq: Two times a day (BID) | ORAL | Status: DC
Start: 2023-05-30 — End: 2023-06-02
  Administered 2023-05-31: 100 mg via ORAL
  Filled 2023-05-30 (×7): qty 1

## 2023-05-30 MED ORDER — POTASSIUM CHLORIDE 10 MEQ/100ML IV SOLN
10.0000 meq | Freq: Once | INTRAVENOUS | Status: AC
  Administered 2023-05-30: 10 meq via INTRAVENOUS
  Filled 2023-05-30: qty 100

## 2023-05-30 MED ORDER — FUROSEMIDE 20 MG PO TABS: 80.0000 mg | ORAL_TABLET | Freq: Every day | ORAL | Status: DC

## 2023-05-30 MED ORDER — LORAZEPAM 2 MG/ML IJ SOLN
0.5000 mg | Freq: Four times a day (QID) | INTRAMUSCULAR | Status: DC | PRN
  Administered 2023-05-30 – 2023-06-01 (×3): 0.5 mg via INTRAVENOUS
  Filled 2023-05-30 (×3): qty 1

## 2023-05-30 MED ORDER — DEXTROSE 50 % IV SOLN
0.0000 mL | INTRAVENOUS | Status: DC | PRN
  Administered 2023-05-30 – 2023-05-31 (×2): 30 mL via INTRAVENOUS
  Administered 2023-05-31: 50 mL via INTRAVENOUS
  Administered 2023-06-02: 25 mL via INTRAVENOUS
  Filled 2023-05-30 (×3): qty 50

## 2023-05-30 MED ORDER — INSULIN REGULAR(HUMAN) IN NACL 100-0.9 UT/100ML-% IV SOLN
INTRAVENOUS | Status: DC
  Administered 2023-05-30: 0.1 [IU]/h via INTRAVENOUS
  Administered 2023-05-30: 2.6 [IU]/h via INTRAVENOUS
  Administered 2023-05-30: 4.4 [IU]/h via INTRAVENOUS
  Filled 2023-05-30 (×3): qty 100

## 2023-05-30 MED ORDER — CALCIUM GLUCONATE-NACL 1-0.675 GM/50ML-% IV SOLN
1.0000 g | Freq: Once | INTRAVENOUS | Status: AC
  Administered 2023-05-30: 1000 mg via INTRAVENOUS
  Filled 2023-05-30: qty 50

## 2023-05-30 MED ORDER — CHLORHEXIDINE GLUCONATE CLOTH 2 % EX PADS
6.0000 | MEDICATED_PAD | Freq: Every day | CUTANEOUS | Status: DC
  Administered 2023-05-31: 6 via TOPICAL

## 2023-05-30 MED ORDER — DEXTROSE IN LACTATED RINGERS 5 % IV SOLN: INTRAVENOUS | Status: DC

## 2023-05-30 MED ORDER — ONDANSETRON HCL 4 MG/2ML IJ SOLN
4.0000 mg | Freq: Once | INTRAMUSCULAR | Status: AC
Start: 2023-05-30 — End: 2023-05-30
  Administered 2023-05-30: 4 mg via INTRAVENOUS
  Filled 2023-05-30: qty 2

## 2023-05-30 MED ORDER — DEXTROSE 10 % IV SOLN
INTRAVENOUS | Status: DC
Start: 2023-05-30 — End: 2023-06-01

## 2023-05-30 MED ORDER — SODIUM CHLORIDE 0.45 % IV SOLN
INTRAVENOUS | Status: AC
  Filled 2023-05-30: qty 75

## 2023-05-30 MED ORDER — ACETAMINOPHEN 650 MG RE SUPP: 650.0000 mg | Freq: Four times a day (QID) | RECTAL | Status: DC | PRN

## 2023-05-30 MED ORDER — FENTANYL CITRATE PF 50 MCG/ML IJ SOSY: 25.0000 ug | PREFILLED_SYRINGE | INTRAMUSCULAR | Status: DC | PRN

## 2023-05-30 MED ORDER — NALOXONE HCL 0.4 MG/ML IJ SOLN: 0.4000 mg | INTRAMUSCULAR | Status: DC | PRN

## 2023-05-30 MED ORDER — SODIUM CHLORIDE 0.9 % IV SOLN
1.0000 g | Freq: Once | INTRAVENOUS | Status: AC
Start: 2023-05-30 — End: 2023-05-30
  Administered 2023-05-30: 1 g via INTRAVENOUS
  Filled 2023-05-30: qty 10

## 2023-05-30 MED ORDER — METRONIDAZOLE 500 MG/100ML IV SOLN
500.0000 mg | Freq: Two times a day (BID) | INTRAVENOUS | Status: DC
  Administered 2023-05-30 – 2023-06-01 (×6): 500 mg via INTRAVENOUS
  Filled 2023-05-30 (×8): qty 100

## 2023-05-30 MED ORDER — INFLUENZA VAC A&B SURF ANT ADJ 0.5 ML IM SUSY
0.5000 mL | PREFILLED_SYRINGE | INTRAMUSCULAR | Status: DC
  Filled 2023-05-30: qty 0.5

## 2023-05-30 MED ORDER — SODIUM CHLORIDE 0.9 % IV BOLUS
500.0000 mL | Freq: Once | INTRAVENOUS | Status: AC
  Administered 2023-05-30: 500 mL via INTRAVENOUS

## 2023-05-30 MED ORDER — DEXTROSE 50 % IV SOLN
12.5000 g | INTRAVENOUS | Status: AC
  Administered 2023-05-30: 12.5 g via INTRAVENOUS

## 2023-05-30 MED ORDER — MELATONIN 3 MG PO TABS: 3.0000 mg | ORAL_TABLET | Freq: Every evening | ORAL | Status: DC | PRN

## 2023-05-30 MED ORDER — CLONIDINE HCL 0.2 MG/24HR TD PTWK: 0.2000 mg | MEDICATED_PATCH | TRANSDERMAL | Status: DC

## 2023-05-30 MED ORDER — LIDOCAINE-EPINEPHRINE 1 %-1:100000 IJ SOLN
20.0000 mL | Freq: Once | INTRAMUSCULAR | Status: DC
Start: 2023-05-30 — End: 2023-05-30
  Administered 2023-05-30: 20 mL via INTRADERMAL
  Filled 2023-05-30: qty 1

## 2023-05-30 MED ORDER — ONDANSETRON HCL 4 MG/2ML IJ SOLN
4.0000 mg | Freq: Four times a day (QID) | INTRAMUSCULAR | Status: DC | PRN
  Administered 2023-05-30 – 2023-06-01 (×6): 4 mg via INTRAVENOUS
  Filled 2023-05-30 (×8): qty 2

## 2023-05-30 MED ORDER — IOHEXOL 350 MG/ML SOLN
65.0000 mL | Freq: Once | INTRAVENOUS | Status: AC | PRN
  Administered 2023-05-30: 65 mL via INTRAVENOUS

## 2023-05-30 MED ORDER — DEXTROSE 50 % IV SOLN
INTRAVENOUS | Status: AC
  Filled 2023-05-30: qty 50

## 2023-05-30 MED ORDER — ROSUVASTATIN CALCIUM 5 MG PO TABS
10.0000 mg | ORAL_TABLET | Freq: Every day | ORAL | Status: DC
  Administered 2023-05-31: 10 mg via ORAL
  Filled 2023-05-30 (×2): qty 2

## 2023-05-30 MED ORDER — ACETAMINOPHEN 325 MG PO TABS
650.0000 mg | ORAL_TABLET | Freq: Four times a day (QID) | ORAL | Status: DC | PRN
  Administered 2023-06-01 – 2023-06-02 (×2): 650 mg via ORAL
  Filled 2023-05-30 (×2): qty 2

## 2023-05-30 MED ORDER — SODIUM CHLORIDE 0.9 % IV SOLN
2.0000 g | Freq: Every day | INTRAVENOUS | Status: DC
Start: 2023-05-30 — End: 2023-06-02
  Administered 2023-05-30 – 2023-06-01 (×3): 2 g via INTRAVENOUS
  Filled 2023-05-30 (×3): qty 20

## 2023-05-30 MED ORDER — INSULIN ASPART 100 UNIT/ML IV SOLN
10.0000 [IU] | Freq: Once | INTRAVENOUS | Status: AC
  Administered 2023-05-30: 10 [IU] via INTRAVENOUS

## 2023-05-30 MED ORDER — LACTATED RINGERS IV SOLN: INTRAVENOUS | Status: DC

## 2023-05-30 MED ORDER — SODIUM ZIRCONIUM CYCLOSILICATE 10 G PO PACK
10.0000 g | PACK | Freq: Once | ORAL | Status: DC
  Filled 2023-05-30: qty 1

## 2023-05-30 NOTE — Progress Notes (Signed)
On admission to 4NP8, blood glucose 63. Based on EndoTool and hypoglycemia protocol, 12.5 mg D50 administered. Rechecked blood glucose 104. Per endo tool, recheck in 1 hour.

## 2023-05-30 NOTE — Progress Notes (Signed)
Pt admitted to 4NP-08 from [facility] ED. Pt somnolent and somewhat responds to voice. Unable to determine orientation. VSS and focused assessment completed. Pt has DPTI on bottom and some scratch marks to posterior back. Personal belongings at bedside. Call bell within reach and bed alarm on in lowest position.

## 2023-05-30 NOTE — ED Notes (Signed)
At about 0545 patient had a 5 second run of V-Tach spotted by Lew Dawes and Kerrie Pleasure while I was in another room. Dr. Wallace Cullens was informed and was at bedside. There have been no further issues, and we will continue to monitor.

## 2023-05-30 NOTE — ED Notes (Signed)
Unsuccessful attempt by phlebotomy at drawing labs at this time. EDP notified.

## 2023-05-30 NOTE — Consult Note (Signed)
[facility] Renal Consultation Note    Indication for Consultation:  Management of ESRD/hemodialysis, anemia, hypertension/volume, and secondary hyperparathyroidism.  HPI: Ann Robertson is a 32 y.o. female with PMH including ESRD on dialysis, T1DM (very poorly controlled), HTN, and noncompliance with medical treatments who presented to the ED with nausea and vomiting. Patient is very somnolent on exam so most of history is obtained from chart. She reported 1 day of vomiting/diarrhea, decreased oral intake, and crampy abdominal pain. She reports she was unable to keep her medications down and was not taking any insulin because she was not feeling well. She had reported missing two dialysis sessions, upon chart review it appears she did go to HD on 12/27 and 12/24 but shortens treatments to 3 hours and usually only goes to HD twice per week instead of prescribed three times per week. Initial labs showed glucose 909. CT abdomen showed massive ascites, body wall edema, gastroenteritis. CXR showed central vascular prominence but no overt edema. She was given a NS bolus and started on insulin and sodium bicarb drip. Initial lactic acid was elevated and she was given ceftriaxone. She underwent bedside paracentesis, unsure of volume. On most recent labs, Glucose 592, BUN 57, Cr 6.51, K+ 3.7.   Patient is very somnolent on exam and unable to provide history. RN reports she will wake up intermittently and is alert and oriented when she is awake.   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   Neuropathy    Preeclampsia    STD (sexually transmitted disease)    Past Surgical History:  Procedure Laterality Date   AV FISTULA PLACEMENT Left 06/19/2021   Procedure: INSERTION OF ARTERIOVENOUS (AV) GORE-TEX GRAFT ARM;  Surgeon: Chuck Hint, MD;   Location: [facility] OR;  Service: Vascular;  Laterality: Left;   CESAREAN SECTION     CESAREAN SECTION N/A 07/05/2013   Procedure: CESAREAN SECTION;  Surgeon: Allie Bossier, MD;  Location: [facility] ORS;  Service: Obstetrics;  Laterality: N/A;   CESAREAN SECTION N/A 07/25/2014   Procedure: CESAREAN SECTION;  Surgeon: Reva Bores, MD;  Location: [facility] ORS;  Service: Obstetrics;  Laterality: N/A;   CESAREAN SECTION N/A 11/21/2015   Procedure: CESAREAN SECTION;  Surgeon: Reva Bores, MD;  Location: [facility] BIRTHING SUITES;  Service: Obstetrics;  Laterality: N/A;   IR FLUORO GUIDE CV LINE RIGHT  06/16/2021   IR US GUIDE VASC ACCESS RIGHT  06/16/2021   WISDOM TOOTH EXTRACTION     Family History  Problem Relation Age of Onset   Stroke Mother    Hypertension Mother    Heart disease Mother    Kidney disease Mother    Hyperlipidemia Father    Diabetes Father    Social History:  reports that she has quit smoking. Her smoking use included cigarettes. She has a 2.3 pack-year smoking history. She has never used smokeless tobacco. She reports that she does not currently use alcohol. She reports that she does not use drugs.  ROS: As per HPI otherwise negative.  Physical Exam: Vitals:   05/30/23 0732 05/30/23 0815 05/30/23 0930 05/30/23 1021  BP:  113/87 110/87 (!) 129/97  Pulse:  95 93 94  Resp:  14 14 14   Temp: 97.8 F (36.6 C)     TempSrc:      SpO2:  100% 100% 100%  Weight:      Height:         General: Somnolent, chronically ill appearing female  Head: Normocephalic, atraumatic, sclera non-icteric, mucus membranes are moist. Lungs: Clear bilaterally to auscultation anteriorly without wheezes, rales, or rhonchi. Breathing is unlabored. Heart: RRR with normal S1, S2. No murmurs, rubs, or gallops appreciated. Abdomen: + ascites, + BS, no notable TTP Lower extremities: 1+ pitting edema b/l lower extremities Neuro: somnolent on exam Dialysis Access: LUE AVG + T/b  Allergies  Allergen Reactions   Dilaudid  [Hydromorphone] Itching and Rash   Prior to Admission medications   Medication Sig Start Date End Date Taking? Authorizing Provider  polycarbophil (FIBERCON) 625 MG tablet Take 1 tablet (625 mg total) by mouth 2 (two) times daily. 02/03/23   Carmina Miller, DO  furosemide (LASIX) 80 MG tablet Take 80 mg by mouth daily.    [provider]  gabapentin (NEURONTIN) 100 MG capsule Take 1 capsule (100 mg total) by mouth 3 (three) times daily as needed (neuropathy). 02/03/23   Carmina Miller, DO  hydrOXYzine (ATARAX) 25 MG tablet Take 25 mg by mouth.    [provider]  insulin glargine (LANTUS SOLOSTAR) 100 UNIT/ML Solostar Pen Inject 3 Units into the skin daily.    [provider]  insulin lispro (HUMALOG) 100 UNIT/ML KwikPen Inject 0-6 Units into the skin 3 (three) times daily with meals. Check Blood Glucose (BG) and inject per scale: BG <150= 0 unit; BG 150-200= 1 unit; BG 201-250= 2 unit; BG 251-300= 3 unit; BG 301-350= 4 unit; BG 351-400= 5 unit; BG >400= 6 unit and Call Primary Care. 02/03/23   Carmina Miller, DO  labetalol (NORMODYNE) 100 MG tablet Take 1 tablet (100 mg total) by mouth 2 (two) times daily. 02/03/23   Carmina Miller, DO  lidocaine-prilocaine (EMLA) cream Apply 1 Application topically as needed (For port access). 02/03/23   Carmina Miller, DO  lipase/protease/amylase (CREON) 36000 UNITS CPEP capsule Take 2 capsules (72,000 Units total) by mouth 3 (three) times daily with meals. May also take 1 capsule (36,000 Units total) as needed (with snacks - up to 4 snacks daily). 02/14/23   Carmina Miller, DO  loperamide (IMODIUM) 2 MG capsule Take 2 capsules (4 mg total) by mouth as needed for diarrhea or loose stools. Not to exceed 8 caps per day 02/03/23   Carmina Miller, DO  methocarbamol (ROBAXIN) 750 MG tablet Take 1 tablet (750 mg total) by mouth every 8 (eight) hours as needed for muscle spasms. 02/03/23   Carmina Miller, DO  omeprazole (PRILOSEC) 20 MG capsule Take 1 capsule (20 mg  total) by mouth daily as needed (acid reflux/heartburn). 02/03/23   Carmina Miller, DO  oxyCODONE-acetaminophen (PERCOCET) 10-325 MG tablet Take 1 tablet by mouth every 6 (six) hours as needed for pain.    [provider]  rosuvastatin (CRESTOR) 10 MG tablet Take 1 tablet (10 mg total) by mouth daily. 02/04/23   Carmina Miller, DO  Acidophilus Lactobacillus CAPS Take 1 capsule by mouth 3 (three) times daily with meals. 02/03/23   Carmina Miller, DO   Current Facility-Administered Medications  Medication Dose Route Frequency Provider Last Rate Last Admin   acetaminophen (TYLENOL) tablet 650 mg  650 mg Oral Q6H PRN Howerter, Justin B, DO       Or   acetaminophen (TYLENOL) suppository 650 mg  650 mg Rectal Q6H PRN Howerter, Justin B, DO       cefTRIAXone (ROCEPHIN) 2 g  in sodium chloride 0.9 % 100 mL IVPB  2 g Intravenous Daily Alwyn Ren, MD 200 mL/hr at 05/30/23 1044 2 g at 05/30/23 1044   dextrose 5 % in lactated ringers infusion   Intravenous Continuous Sloan Leiter, DO   Held at 05/30/23 4098   dextrose 50 % solution 0-50 mL  0-50 mL Intravenous PRN Tanda Rockers A, DO       fentaNYL (SUBLIMAZE) injection 25 mcg  25 mcg Intravenous Q2H PRN Howerter, Justin B, DO       insulin regular, human (MYXREDLIN) 100 units/ 100 mL infusion   Intravenous Continuous Tanda Rockers A, DO 9 mL/hr at 05/30/23 0949 9 Units/hr at 05/30/23 0949   labetalol (NORMODYNE) tablet 100 mg  100 mg Oral BID Howerter, Justin B, DO       LORazepam (ATIVAN) injection 0.5 mg  0.5 mg Intravenous Q6H PRN Howerter, Justin B, DO   0.5 mg at 05/30/23 1191   melatonin tablet 3 mg  3 mg Oral QHS PRN Howerter, Justin B, DO       metroNIDAZOLE (FLAGYL) IVPB 500 mg  500 mg Intravenous BID Alwyn Ren, MD 100 mL/hr at 05/30/23 1046 500 mg at 05/30/23 1046   naloxone (NARCAN) injection 0.4 mg  0.4 mg Intravenous PRN Howerter, Justin B, DO       ondansetron (ZOFRAN) injection 4 mg  4 mg Intravenous Q6H PRN Howerter,  Justin B, DO       rosuvastatin (CRESTOR) tablet 10 mg  10 mg Oral Daily Howerter, Justin B, DO       sodium bicarbonate 75 mEq in sodium chloride 0.45 % 1,075 mL infusion   Intravenous Continuous Alwyn Ren, MD 75 mL/hr at 05/30/23 1039 New Bag at 05/30/23 1039   sodium zirconium cyclosilicate (LOKELMA) packet 10 g  10 g Oral Once Sloan Leiter, DO       Current Outpatient Medications  Medication Sig Dispense Refill   polycarbophil (FIBERCON) 625 MG tablet Take 1 tablet (625 mg total) by mouth 2 (two) times daily. 60 tablet 0   furosemide (LASIX) 80 MG tablet Take 80 mg by mouth daily.     gabapentin (NEURONTIN) 100 MG capsule Take 1 capsule (100 mg total) by mouth 3 (three) times daily as needed (neuropathy). 90 capsule 0   hydrOXYzine (ATARAX) 25 MG tablet Take 25 mg by mouth.     insulin glargine (LANTUS SOLOSTAR) 100 UNIT/ML Solostar Pen Inject 3 Units into the skin daily.     insulin lispro (HUMALOG) 100 UNIT/ML KwikPen Inject 0-6 Units into the skin 3 (three) times daily with meals. Check Blood Glucose (BG) and inject per scale: BG <150= 0 unit; BG 150-200= 1 unit; BG 201-250= 2 unit; BG 251-300= 3 unit; BG 301-350= 4 unit; BG 351-400= 5 unit; BG >400= 6 unit and Call Primary Care.     labetalol (NORMODYNE) 100 MG tablet Take 1 tablet (100 mg total) by mouth 2 (two) times daily. 60 tablet 0   lidocaine-prilocaine (EMLA) cream Apply 1 Application topically as needed (For port access). 30 g 0   lipase/protease/amylase (CREON) 36000 UNITS CPEP capsule Take 2 capsules (72,000 Units total) by mouth 3 (three) times daily with meals. May also take 1 capsule (36,000 Units total) as needed (with snacks - up to 4 snacks daily). 300 capsule 11   loperamide (IMODIUM) 2 MG capsule Take 2 capsules (4 mg total) by mouth as needed for diarrhea or loose stools. Not to  exceed 8 caps per day 30 capsule 0   methocarbamol (ROBAXIN) 750 MG tablet Take 1 tablet (750 mg total) by mouth every 8 (eight)  hours as needed for muscle spasms. 90 tablet 0   omeprazole (PRILOSEC) 20 MG capsule Take 1 capsule (20 mg total) by mouth daily as needed (acid reflux/heartburn). 30 capsule 0   oxyCODONE-acetaminophen (PERCOCET) 10-325 MG tablet Take 1 tablet by mouth every 6 (six) hours as needed for pain.     rosuvastatin (CRESTOR) 10 MG tablet Take 1 tablet (10 mg total) by mouth daily. 30 tablet 0   Acidophilus Lactobacillus CAPS Take 1 capsule by mouth 3 (three) times daily with meals. 100 capsule 0   Labs: Basic Metabolic Panel: Recent Labs  Lab 05/30/23 0129 05/30/23 0200 05/30/23 0810  NA 120* 122* 127*  K 6.6* 4.4 3.7  CL 89* 87* 90*  CO2  --  19* 19*  GLUCOSE >700* 909* 592*  BUN 83* 57* 57*  CREATININE 6.80* 6.58* 6.51*  CALCIUM  --  7.9* 8.6*  PHOS  --   --  4.9*   Liver Function Tests: Recent Labs  Lab 05/30/23 0200  AST 15  ALT 10  ALKPHOS 134*  BILITOT 1.4*  PROT 6.2*  ALBUMIN 2.6*   Recent Labs  Lab 05/30/23 0200  LIPASE 22   No results for input(s): "AMMONIA" in the last 168 hours. CBC: Recent Labs  Lab 05/30/23 0114 05/30/23 0128 05/30/23 0129  WBC 7.1  --   --   NEUTROABS 5.5  --   --   HGB 10.2* 12.9 12.9  HCT 32.0* 38.0 38.0  MCV 88.2  --   --   PLT 106*  --   --    Cardiac Enzymes: No results for input(s): "CKTOTAL", "CKMB", "CKMBINDEX", "TROPONINI" in the last 168 hours. CBG: Recent Labs  Lab 05/30/23 0804 05/30/23 0840 05/30/23 0919 05/30/23 0948 05/30/23 1023  GLUCAP 533* 455* 427* 432* 406*   Iron Studies: No results for input(s): "IRON", "TIBC", "TRANSFERRIN", "FERRITIN" in the last 72 hours. Studies/Results: CT ABDOMEN PELVIS W CONTRAST Result Date: 05/30/2023 CLINICAL DATA:  Abdominal pain and swelling and swelling in the feet. History of end-stage renal failure and diabetes. EXAM: CT ABDOMEN AND PELVIS WITH CONTRAST TECHNIQUE: Multidetector CT imaging of the abdomen and pelvis was performed using the standard protocol following bolus  administration of intravenous contrast. RADIATION DOSE REDUCTION: This exam was performed according to the departmental dose-optimization program which includes automated exposure control, adjustment of the mA and/or kV according to patient size and/or use of iterative reconstruction technique. CONTRAST:  65mL OMNIPAQUE IOHEXOL 350 MG/ML SOLN COMPARISON:  CT without contrast 04/19/2021. FINDINGS: Lower chest: There are small right and minimal left layering pleural effusions, with atelectatic changes in both bases. There is cardiomegaly with a right chamber predominance, likely indicating chronic right heart dysfunction and also evidenced by IVC and hepatic vein contrast reflux. There are coronary artery calcifications, age-advanced. Hepatobiliary: Loss of fine detail owing to massive ascites, body wall anasarca and artifact from overlying wires and patient's arms in the field. The liver is mildly steatotic. No obvious mass is seen and no calcified gallstones or gallbladder thickening. No biliary dilatation. Pancreas: Atrophic and otherwise unremarkable. 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 No mass enhancement. No splenomegaly. Adrenals/Urinary Tract: There is bilateral renal cortical thinning. No adrenal mass or renal mass enhancement. There is air in both collecting systems which could be due to recent instrumentation or  infectious process. Air in the bladder is also noted. Bladder wall is nondistended. There is no evidence of vesicoenteric fistula. Stomach/Bowel: There is mucosal enhancement of the stomach and proximal small bowel consistent with gastroenteritis. No dilatation or wall thickening in the stomach and small bowel. The appendix is normal. There is either wall thickening or nondistention in the ascending, transverse and descending colon. Scattered sigmoid diverticula. Correlate clinically for underlying colitis. Unable to tell if there is adjacent inflammatory change due to  massive ascites. Vascular/Lymphatic: Age-advanced aortic and branch vessel atherosclerosis. No AAA. No adenopathy is seen through the ascites. Reproductive: The uterus is intact. The ovaries are not enlarged. There are bilateral BTL clips, seen previously. Other: massive ascites and diffuse body wall anasarca. No free air. No free air hemorrhage or localizing collections. The ascites displaces the bowel inward and extends up around the liver and spleen. There is no incarcerated hernia. Musculoskeletal: No acute or significant osseous findings. IMPRESSION: 1. Massive ascites and diffuse body wall anasarca. 2. Small right and minimal left pleural effusions. Basilar atelectasis. 3. Cardiomegaly with right chamber predominance, likely indicating chronic right heart dysfunction. 4. Age-advanced atherosclerosis. 5. Air in the renal collecting systems and bladder which could be due to recent instrumentation or infectious process. 6. Mucosal enhancement in the stomach and proximal small bowel consistent with gastroenteritis. 7. Wall thickening versus nondistention in the ascending, transverse and descending colon. Correlate clinically for underlying colitis. 8. Diverticulosis. 9. Aortic atherosclerosis. Aortic Atherosclerosis (ICD10-I70.0). Electronically Signed   By: Almira Bar M.D.   On: 05/30/2023 02:25   DG Chest Portable 1 View Result Date: 05/30/2023 CLINICAL DATA:  Abdominal pain with extremity swelling. Check volume status. EXAM: PORTABLE CHEST 1 VIEW COMPARISON:  Portable chest 02/02/2023 FINDINGS: There is a small right pleural effusion which was seen previously, with overlying atelectasis. Low inspiration on exam. There is increased left perihilar linear atelectasis. The hypoinflated lungs otherwise appear clear. There is no substantial left effusion. The heart silhouette is enlarged but probably exaggerated in part due to low inspiration. There is mild central vascular prominence which could be due to  low lung volumes or mild perihilar vascular congestion. No edema is seen. The mediastinum is normally outlined.  The thoracic cage is intact. IMPRESSION: 1. Low inspiration on exam. 2. Small right pleural effusion with overlying atelectasis. 3. Increased left perihilar linear atelectasis. No visible infiltrate. 4. Cardiomegaly. 5. Mild central vascular prominence which could be due to low lung volumes or mild perihilar vascular congestion. No edema is seen. Electronically Signed   By: Almira Bar M.D.   On: 05/30/2023 00:41    Dialysis Orders:  Center: [facility]  on MWF. 180NRe4 hours BFR 400 DFR Aut o1.5 EDW 46.5kg 2K 3Ca AVG 16g Heparin 2500 unit bolus Mircera IV q 2 weeks Calcitriol 0.5mcg PO q HD Korsuva 0.46ml q HD  Assessment/Plan:  Hyperglycemia: Very poorly controlled T1DM with recent insulin non-compliance. BS >900 in the ED, started on insulin drip.  Management per admitting team.  ESRD:  HD schedule is MWF but she typically only attends dialysis two times per week for 3/4 prescribed hours. She is volume overloaded but this is chronic, not in any respiratory distress and K+ is controlled. Will attempt to arrange for HD today, if patient needs to  be done at bedside due to insulin drip she may need to receive HD tomorrow instead.  Hypertension/volume: ++ ascites and edema on exam due to chronic noncompliance with HD and fluid restrictions.  I have stopped her IVF. UF with HD as tolerated.   Anemia: Hgb in the 12's, no ESA indicated at this time.   Metabolic bone disease: Calcium and phos controlled, continue calcitriol and phos binders once tolerating PO  Nutrition:  Currently NPO  Rogers Blocker, PA-C 05/30/2023, 10:54 AM  [facility] Pager: [phone]

## 2023-05-30 NOTE — ED Notes (Signed)
Per Endotool, change insulin gtt to 1.6.

## 2023-05-30 NOTE — ED Notes (Signed)
Per ENDOTOOL, stop insulin gtt.

## 2023-05-30 NOTE — ED Notes (Signed)
Unsuccessful blood draw, nurse aware.  KM

## 2023-05-30 NOTE — ED Notes (Signed)
ED TO INPATIENT HANDOFF REPORT  ED Nurse Name and Phone #: Vernona Rieger [phone]  S Name/Age/Gender Ann Robertson 32 y.o. female Room/Bed: 040C/040C  Code Status   Code Status: Full Code  Home/SNF/Other Home Patient oriented to: self and place Is this baseline? No   Triage Complete: Triage complete  Chief Complaint Hyperglycemia [R73.9]  Triage Note PER EMS: pt is from with c/o global abdominal pain, n/v with some abd distension and pedal swelling. She goes to dialysis on Mon/Wed/Friday, went on Saturday due to holiday schedule changes.    BP-194/120, HR-90, 99% RA, CBG-high. She is insulin dependent but did not take her insulin today.  22gRFA and received 4mg  of Zofran IV and 100 mcg of Fentanyl en route   Allergies Allergies  Allergen Reactions   Dilaudid [Hydromorphone] Itching and Rash    Level of Care/Admitting Diagnosis ED Disposition     ED Disposition  Admit   Condition  --   Comment  Hospital Area: [facility] [[id number]]  Level of Care: Progressive [102]  Admit to Progressive based on following criteria: MULTISYSTEM THREATS such as stable sepsis, metabolic/electrolyte imbalance with or without encephalopathy that is responding to early treatment.  May admit patient to [facility] or [facility] if equivalent level of care is available:: No  Covid Evaluation: Asymptomatic - no recent exposure (last 10 days) testing not required  Diagnosis: Hyperglycemia [119147]  Admitting Physician: Angie Fava [[id number]]  Attending Physician: Angie Fava [[id number]]  Certification:: I certify this patient will need inpatient services for at least 2 midnights  Expected Medical Readiness: 06/01/2023          B Medical/Surgery History 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   Neuropathy    Preeclampsia    STD  (sexually transmitted disease)    Past Surgical History:  Procedure Laterality Date   AV FISTULA PLACEMENT Left 06/19/2021   Procedure: INSERTION OF ARTERIOVENOUS (AV) GORE-TEX GRAFT ARM;  Surgeon: Chuck Hint, MD;  Location: [facility] OR;  Service: Vascular;  Laterality: Left;   CESAREAN SECTION     CESAREAN SECTION N/A 07/05/2013   Procedure: CESAREAN SECTION;  Surgeon: Allie Bossier, MD;  Location: [facility] ORS;  Service: Obstetrics;  Laterality: N/A;   CESAREAN SECTION N/A 07/25/2014   Procedure: CESAREAN SECTION;  Surgeon: Reva Bores, MD;  Location: [facility] ORS;  Service: Obstetrics;  Laterality: N/A;   CESAREAN SECTION N/A 11/21/2015   Procedure: CESAREAN SECTION;  Surgeon: Reva Bores, MD;  Location: [facility] BIRTHING SUITES;  Service: Obstetrics;  Laterality: N/A;   IR FLUORO GUIDE CV LINE RIGHT  06/16/2021   IR US GUIDE VASC ACCESS RIGHT  06/16/2021   WISDOM TOOTH EXTRACTION       A IV Location/Drains/Wounds Patient Lines/Drains/Airways Status     Active Line/Drains/Airways     Name Placement date Placement time Site Days   Peripheral IV 05/29/23 22 G Distal;Posterior;Right Forearm 05/29/23  2358  Forearm  1   Peripheral IV 05/30/23 20 G Anterior;Proximal;Right Forearm 05/30/23  0012  Forearm  less than 1   Peripheral IV 05/30/23 20 G 1.88" Anterior;Right Forearm 05/30/23  0127  Forearm  less than 1   Fistula / Graft Left Upper arm Arteriovenous vein graft 06/19/21  0850  Upper arm  710   Hemodialysis Catheter  Right Subclavian Double lumen Permanent (Tunneled) 06/16/21  1611  Subclavian  713   Pressure Injury 06/04/22 Ischial tuberosity Right Deep Tissue Pressure Injury - Purple or maroon localized area of discolored intact skin or blood-filled blister due to damage of underlying soft tissue from pressure and/or shear. 06/04/22  1340  -- 360            Intake/Output Last 24 hours  Intake/Output Summary (Last 24 hours) at 05/30/2023 1706 Last data filed at 05/30/2023 1146 Gross per  24 hour  Intake 1056.61 ml  Output --  Net 1056.61 ml    Labs/Imaging Results for orders placed or performed during the hospital encounter of 05/29/23 (from the past 48 hours)  CBG monitoring, ED     Status: Abnormal   Collection Time: 05/29/23 11:57 PM  Result Value Ref Range   Glucose-Capillary >600 (HH) 70 - 99 mg/dL    Comment: Glucose reference range applies only to samples taken after fasting for at least 8 hours.  Resp panel by RT-PCR (RSV, Flu A&B, Covid) Anterior Nasal Swab     Status: None   Collection Time: 05/30/23 12:00 AM   Specimen: Anterior Nasal Swab  Result Value Ref Range   SARS Coronavirus 2 by RT PCR NEGATIVE NEGATIVE   Influenza A by PCR NEGATIVE NEGATIVE   Influenza B by PCR NEGATIVE NEGATIVE    Comment: (NOTE) The Xpert Xpress SARS-CoV-2/FLU/RSV plus assay is intended as an aid in the diagnosis of influenza from Nasopharyngeal swab specimens and should not be used as a sole basis for treatment. Nasal washings and aspirates are unacceptable for Xpert Xpress SARS-CoV-2/FLU/RSV testing.  Fact Sheet for Patients: BloggerCourse.com  Fact Sheet for Healthcare Providers: SeriousBroker.it  This test is not yet approved or cleared by the [place] FDA and has been authorized for detection and/or diagnosis of SARS-CoV-2 by FDA under an Emergency Use Authorization (EUA). This EUA will remain in effect (meaning this test can be used) for the duration of the COVID-19 declaration under Section 564(b)(1) of the Act, 21 U.S.C. section 360bbb-3(b)(1), unless the authorization is terminated or revoked.     Resp Syncytial Virus by PCR NEGATIVE NEGATIVE    Comment: (NOTE) Fact Sheet for Patients: BloggerCourse.com  Fact Sheet for Healthcare Providers: SeriousBroker.it  This test is not yet approved or cleared by the [place] FDA and has been authorized for  detection and/or diagnosis of SARS-CoV-2 by FDA under an Emergency Use Authorization (EUA). This EUA will remain in effect (meaning this test can be used) for the duration of the COVID-19 declaration under Section 564(b)(1) of the Act, 21 U.S.C. section 360bbb-3(b)(1), unless the authorization is terminated or revoked.  Performed at [facility], [address] [place], [place] [address]   CBC with Differential     Status: Abnormal   Collection Time: 05/30/23  1:14 AM  Result Value Ref Range   WBC 7.1 4.0 - 10.5 K/uL   RBC 3.63 (L) 3.87 - 5.11 MIL/uL   Hemoglobin 10.2 (L) 12.0 - 15.0 g/dL   HCT 60.4 (L) 54.0 - 98.1 %   MCV 88.2 80.0 - 100.0 fL   MCH 28.1 26.0 - 34.0 pg   MCHC 31.9 30.0 - 36.0 g/dL   RDW 19.1 47.8 - 29.5 %   Platelets 106 (L) 150 - 400 K/uL    Comment: REPEATED TO VERIFY   nRBC 0.0 0.0 - 0.2 %   Neutrophils Relative % 79 %   Neutro Abs 5.5  1.7 - 7.7 K/uL   Lymphocytes Relative 16 %   Lymphs Abs 1.1 0.7 - 4.0 K/uL   Monocytes Relative 4 %   Monocytes Absolute 0.3 0.1 - 1.0 K/uL   Eosinophils Relative 0 %   Eosinophils Absolute 0.0 0.0 - 0.5 K/uL   Basophils Relative 0 %   Basophils Absolute 0.0 0.0 - 0.1 K/uL   Immature Granulocytes 1 %   Abs Immature Granulocytes 0.04 0.00 - 0.07 K/uL    Comment: Performed at [facility], [address], [place], [place] [address]  Beta-hydroxybutyric acid     Status: Abnormal   Collection Time: 05/30/23  1:14 AM  Result Value Ref Range   Beta-Hydroxybutyric Acid 1.06 (H) 0.05 - 0.27 mmol/L    Comment: Performed at [facility], [address], [place], [place] [address]  I-Stat venous blood gas, [facility] ED, [facility], [facility])     Status: Abnormal   Collection Time: 05/30/23  1:28 AM  Result Value Ref Range   pH, Ven 7.387 7.25 - 7.43   pCO2, Ven 40.0 (L) 44 - 60 mmHg   pO2, Ven 53 (H) 32 - 45 mmHg   Bicarbonate 24.0 20.0 - 28.0 mmol/L   TCO2 25 22 - 32 mmol/L   O2 Saturation 87 %   Acid-base deficit 1.0 0.0 -  2.0 mmol/L   Sodium 120 (L) 135 - 145 mmol/L   Potassium 6.5 (HH) 3.5 - 5.1 mmol/L   Calcium, Ion 0.87 (LL) 1.15 - 1.40 mmol/L   HCT 38.0 36.0 - 46.0 %   Hemoglobin 12.9 12.0 - 15.0 g/dL   Sample type VENOUS    Comment NOTIFIED PHYSICIAN   I-stat chem 8, ED (not at [facility], [facility] or [facility])     Status: Abnormal   Collection Time: 05/30/23  1:29 AM  Result Value Ref Range   Sodium 120 (L) 135 - 145 mmol/L   Potassium 6.6 (HH) 3.5 - 5.1 mmol/L   Chloride 89 (L) 98 - 111 mmol/L   BUN 83 (H) 6 - 20 mg/dL   Creatinine, Ser 0.98 (H) 0.44 - 1.00 mg/dL   Glucose, Bld >119 (HH) 70 - 99 mg/dL    Comment: Glucose reference range applies only to samples taken after fasting for at least 8 hours.   Calcium, Ion 0.87 (LL) 1.15 - 1.40 mmol/L   TCO2 22 22 - 32 mmol/L   Hemoglobin 12.9 12.0 - 15.0 g/dL   HCT 14.7 82.9 - 56.2 %   Comment NOTIFIED PHYSICIAN   Comprehensive metabolic panel     Status: Abnormal   Collection Time: 05/30/23  2:00 AM  Result Value Ref Range   Sodium 122 (L) 135 - 145 mmol/L   Potassium 4.4 3.5 - 5.1 mmol/L   Chloride 87 (L) 98 - 111 mmol/L   CO2 19 (L) 22 - 32 mmol/L   Glucose, Bld 909 (HH) 70 - 99 mg/dL    Comment: CRITICAL RESULT CALLED TO, READ BACK BY AND VERIFIED WITH TERRY R, RN [phone] 05/30/2023 SANDOVAL K Glucose reference range applies only to samples taken after fasting for at least 8 hours.    BUN 57 (H) 6 - 20 mg/dL   Creatinine, Ser 6.57 (H) 0.44 - 1.00 mg/dL   Calcium 7.9 (L) 8.9 - 10.3 mg/dL   Total Protein 6.2 (L) 6.5 - 8.1 g/dL   Albumin 2.6 (L) 3.5 - 5.0 g/dL   AST 15 15 - 41 U/L   ALT 10 0 -  44 U/L   Alkaline Phosphatase 134 (H) 38 - 126 U/L   Total Bilirubin 1.4 (H) <1.2 mg/dL   GFR, Estimated 8 (L) >60 mL/min    Comment: (NOTE) Calculated using the CKD-EPI Creatinine Equation (2021)    Anion gap 16 (H) 5 - 15    Comment: Performed at [facility], [address], [place], [place] [address]  Lipase, blood     Status: None   Collection Time:  05/30/23  2:00 AM  Result Value Ref Range   Lipase 22 11 - 51 U/L    Comment: Performed at [facility], [address], [place], [place] [address]  CBG monitoring, ED     Status: Abnormal   Collection Time: 05/30/23  2:34 AM  Result Value Ref Range   Glucose-Capillary >600 (HH) 70 - 99 mg/dL    Comment: Glucose reference range applies only to samples taken after fasting for at least 8 hours.  CBG monitoring, ED     Status: Abnormal   Collection Time: 05/30/23  3:44 AM  Result Value Ref Range   Glucose-Capillary >600 (HH) 70 - 99 mg/dL    Comment: Glucose reference range applies only to samples taken after fasting for at least 8 hours.  CBG monitoring, ED     Status: Abnormal   Collection Time: 05/30/23  4:23 AM  Result Value Ref Range   Glucose-Capillary >600 (HH) 70 - 99 mg/dL    Comment: Glucose reference range applies only to samples taken after fasting for at least 8 hours.  Body fluid culture w Gram Stain     Status: None (Preliminary result)   Collection Time: 05/30/23  4:24 AM   Specimen: Peritoneal Fluid  Result Value Ref Range   Specimen Description FLUID PERITONEAL    Special Requests NONE    Gram Stain      NO WBC SEEN NO ORGANISMS SEEN Performed at [facility], [address] [place], [place] [address]    Culture PENDING    Report Status PENDING   Lactate dehydrogenase (pleural or peritoneal fluid)     Status: Abnormal   Collection Time: 05/30/23  4:26 AM  Result Value Ref Range   LD, Fluid 37 (H) 3 - 23 U/L    Comment: (NOTE) Results should be evaluated in conjunction with serum values    Fluid Type-FLDH PERITONEAL     Comment: Performed at [facility], [address], [place], [place] [address] CORRECTED ON 12/30 AT 0522: PREVIOUSLY REPORTED AS PERITONEAL CAVITY   Glucose, pleural or peritoneal fluid     Status: None   Collection Time: 05/30/23  4:26 AM  Result Value Ref Range   Glucose, Fluid 974 mg/dL    Comment: CRITICAL RESULT  CALLED TO, READ BACK BY AND VERIFIED WITH TERRY,R RN [phone] 05/30/23 AMIREHSANIF (NOTE) No normal range established for this test Results should be evaluated in conjunction with serum values    Fluid Type-FGLU PERITONEAL     Comment: Performed at [facility], [address] [place], [place] [address] CORRECTED ON 12/30 AT 0522: PREVIOUSLY REPORTED AS PERITONEAL CAVITY   Protein, pleural or peritoneal fluid     Status: None   Collection Time: 05/30/23  4:26 AM  Result Value Ref Range   Total protein, fluid 4.4 g/dL    Comment: (NOTE) No normal range established for this test Results should be evaluated in conjunction with serum values    Fluid Type-FTP  PERITONEAL     Comment: Performed at [facility], [address], [place], [place] [address] CORRECTED ON 12/30 AT 0522: PREVIOUSLY REPORTED AS PERITONEAL CAVITY   Albumin, pleural or peritoneal fluid      Status: None   Collection Time: 05/30/23  4:26 AM  Result Value Ref Range   Albumin, Fluid 2.0 g/dL    Comment: (NOTE) No normal range established for this test Results should be evaluated in conjunction with serum values    Fluid Type-FALB PERITONEAL     Comment: Performed at [facility], [address], [place], [place] [address] CORRECTED ON 12/30 AT 0522: PREVIOUSLY REPORTED AS PERITONEAL CAVITY   Body fluid cell count with differential     Status: Abnormal   Collection Time: 05/30/23  4:26 AM  Result Value Ref Range   Fluid Type-FCT PERITONEAL     Comment: CORRECTED ON 12/30 AT 0522: PREVIOUSLY REPORTED AS Body Fluid   Color, Fluid YELLOW    Appearance, Fluid HAZY (A) CLEAR   Total Nucleated Cell Count, Fluid 163 0 - 1,000 cu mm   Neutrophil Count, Fluid 9 0 - 25 %   Lymphs, Fluid 22 %   Monocyte-Macrophage-Serous Fluid 69 50 - 90 %   Eos, Fluid 0 %   Other Cells, Fluid RARE MESOTHELIAL CELL %    Comment: Performed at [facility], [address] [place], [place] [address]  Pathologist  smear review     Status: None   Collection Time: 05/30/23  4:26 AM  Result Value Ref Range   Path Review            Comment: REACTIVE MESOTHELIAL CELLS AND MACROPHAGES PRESENT, NO ATYPIA SEEN. Reviewed by Beulah Gandy. Luisa Hart, M.D.  05/30/2023 Performed at [facility], [address] [place], [place] [address]   CBG monitoring, ED     Status: Abnormal   Collection Time: 05/30/23  5:04 AM  Result Value Ref Range   Glucose-Capillary >600 (HH) 70 - 99 mg/dL    Comment: Glucose reference range applies only to samples taken after fasting for at least 8 hours.  CBG monitoring, ED     Status: Abnormal   Collection Time: 05/30/23  6:02 AM  Result Value Ref Range   Glucose-Capillary 585 (HH) 70 - 99 mg/dL    Comment: Glucose reference range applies only to samples taken after fasting for at least 8 hours.   Comment 1 Notify RN   Urinalysis, Routine w reflex microscopic -Urine, Clean Catch     Status: Abnormal   Collection Time: 05/30/23  6:05 AM  Result Value Ref Range   Color, Urine AMBER (A) YELLOW    Comment: BIOCHEMICALS MAY BE AFFECTED BY COLOR   APPearance TURBID (A) CLEAR   Specific Gravity, Urine 1.014 1.005 - 1.030   pH 5.0 5.0 - 8.0   Glucose, UA >=500 (A) NEGATIVE mg/dL   Hgb urine dipstick LARGE (A) NEGATIVE   Bilirubin Urine NEGATIVE NEGATIVE   Ketones, ur NEGATIVE NEGATIVE mg/dL   Protein, ur 295 (A) NEGATIVE mg/dL   Nitrite NEGATIVE NEGATIVE   Leukocytes,Ua SMALL (A) NEGATIVE    Comment: Performed at [facility], [address] [place], [place] [address]  Urinalysis, Microscopic (reflex)     Status: Abnormal   Collection Time: 05/30/23  6:05 AM  Result Value Ref Range   RBC / HPF >50 0 - 5 RBC/hpf   WBC, UA >50 0 - 5  WBC/hpf   Bacteria, UA MANY (A) NONE SEEN   Squamous Epithelial / HPF 6-10 0 - 5 /HPF    Comment: Performed at [facility], [address] [place], [place] [address]  hCG, serum, qualitative     Status: None   Collection Time:  05/30/23  6:06 AM  Result Value Ref Range   Preg, Serum NEGATIVE NEGATIVE    Comment:        THE SENSITIVITY OF THIS METHODOLOGY IS >10 mIU/mL. Performed at [facility], [address], [place], [place] [address]   Beta-hydroxybutyric acid     Status: None   Collection Time: 05/30/23  6:06 AM  Result Value Ref Range   Beta-Hydroxybutyric Acid 0.17 0.05 - 0.27 mmol/L    Comment: Performed at [facility], [address] [place], [place] [address]  Ethanol     Status: None   Collection Time: 05/30/23  6:06 AM  Result Value Ref Range   Alcohol, Ethyl (B) <10 <10 mg/dL    Comment: (NOTE) Lowest detectable limit for serum alcohol is 10 mg/dL.  For medical purposes only. Performed at [facility], [address], [place], [place] [address]   Acetaminophen level     Status: Abnormal   Collection Time: 05/30/23  6:06 AM  Result Value Ref Range   Acetaminophen (Tylenol), Serum <10 (L) 10 - 30 ug/mL    Comment: (NOTE) Therapeutic concentrations vary significantly. A range of 10-30 ug/mL  may be an effective concentration for many patients. However, some  are best treated at concentrations outside of this range. Acetaminophen concentrations >150 ug/mL at 4 hours after ingestion  and >50 ug/mL at 12 hours after ingestion are often associated with  toxic reactions.  Performed at [facility], [address] [place], [place] [address]   Blood gas, venous     Status: Abnormal   Collection Time: 05/30/23  6:25 AM  Result Value Ref Range   pH, Ven 7.16 (LL) 7.25 - 7.43    Comment: CRITICAL RESULT CALLED TO, READ BACK BY AND VERIFIED WITH: R.TERRY RN [phone] M. ALAMANO    pCO2, Ven 25 (L) 44 - 60 mmHg   pO2, Ven 34 32 - 45 mmHg   Bicarbonate 8.9 (L) 20.0 - 28.0 mmol/L   Acid-base deficit 18.1 (H) 0.0 - 2.0 mmol/L   O2 Saturation 61.5 %   Patient temperature 37.0     Comment: Performed at [facility], [address] [place], [place] [address]   CBG monitoring, ED     Status: Abnormal   Collection Time: 05/30/23  6:41 AM  Result Value Ref Range   Glucose-Capillary >600 (HH) 70 - 99 mg/dL    Comment: Glucose reference range applies only to samples taken after fasting for at least 8 hours.  Osmolality     Status: None   Collection Time: 05/30/23  6:53 AM  Result Value Ref Range   Osmolality 287 275 - 295 mOsm/kg    Comment: Performed at [facility], [address] [place], [place] [address]  CBG monitoring, ED     Status: Abnormal   Collection Time: 05/30/23  7:08 AM  Result Value Ref Range   Glucose-Capillary 542 (HH) 70 - 99 mg/dL    Comment: Glucose reference range applies only to samples taken after fasting for at least 8 hours.   Comment 1 Notify RN   CBG monitoring, ED  Status: Abnormal   Collection Time: 05/30/23  7:40 AM  Result Value Ref Range   Glucose-Capillary 529 (HH) 70 - 99 mg/dL    Comment: Glucose reference range applies only to samples taken after fasting for at least 8 hours.   Comment 1 Notify RN    Comment 2 Document in Chart   CBG monitoring, ED     Status: Abnormal   Collection Time: 05/30/23  8:04 AM  Result Value Ref Range   Glucose-Capillary 533 (HH) 70 - 99 mg/dL    Comment: Glucose reference range applies only to samples taken after fasting for at least 8 hours.   Comment 1 Notify RN    Comment 2 Document in Chart   Basic metabolic panel     Status: Abnormal   Collection Time: 05/30/23  8:10 AM  Result Value Ref Range   Sodium 127 (L) 135 - 145 mmol/L   Potassium 3.7 3.5 - 5.1 mmol/L   Chloride 90 (L) 98 - 111 mmol/L   CO2 19 (L) 22 - 32 mmol/L   Glucose, Bld 592 (HH) 70 - 99 mg/dL    Comment: CRITICAL RESULT CALLED TO, READ BACK BY AND VERIFIED WITH M.MYERS,RN 0903 05/30/23 CLARK,S Glucose reference range applies only to samples taken after fasting for at least 8 hours.    BUN 57 (H) 6 - 20 mg/dL   Creatinine, Ser 4.09 (H) 0.44 - 1.00 mg/dL   Calcium 8.6 (L) 8.9 - 10.3 mg/dL    GFR, Estimated 8 (L) >60 mL/min    Comment: (NOTE) Calculated using the CKD-EPI Creatinine Equation (2021)    Anion gap 18 (H) 5 - 15    Comment: Performed at [facility], [address] [place], [place] [address]  Magnesium     Status: None   Collection Time: 05/30/23  8:10 AM  Result Value Ref Range   Magnesium 1.8 1.7 - 2.4 mg/dL    Comment: Performed at [facility], [address] [place], [place] [address]  Procalcitonin     Status: None   Collection Time: 05/30/23  8:10 AM  Result Value Ref Range   Procalcitonin 34.14 ng/mL    Comment:        Interpretation: PCT >= 10 ng/mL: Important systemic inflammatory response, almost exclusively due to severe bacterial sepsis or septic shock. (NOTE)       Sepsis PCT Algorithm           Lower Respiratory Tract                                      Infection PCT Algorithm    ----------------------------     ----------------------------         PCT < 0.25 ng/mL                PCT < 0.10 ng/mL          Strongly encourage             Strongly discourage   discontinuation of antibiotics    initiation of antibiotics    ----------------------------     -----------------------------       PCT 0.25 - 0.50 ng/mL            PCT 0.10 - 0.25 ng/mL               OR       >  80% decrease in PCT            Discourage initiation of                                            antibiotics      Encourage discontinuation           of antibiotics    ----------------------------     -----------------------------         PCT >= 0.50 ng/mL              PCT 0.26 - 0.50 ng/mL                AND       <80% decrease in PCT             Encourage initiation of                                             antibiotics       Encourage continuation           of antibiotics    ----------------------------     -----------------------------        PCT >= 0.50 ng/mL                  PCT > 0.50 ng/mL               AND         increase in PCT                   Strongly encourage                                      initiation of antibiotics    Strongly encourage escalation           of antibiotics                                     -----------------------------                                           PCT <= 0.25 ng/mL                                                 OR                                        > 80% decrease in PCT                                      Discontinue / Do not initiate  antibiotics  Performed at [facility], [address] [place], [place] [address]   Phosphorus     Status: Abnormal   Collection Time: 05/30/23  8:10 AM  Result Value Ref Range   Phosphorus 4.9 (H) 2.5 - 4.6 mg/dL    Comment: Performed at [facility], [address], [place], [place] [address]  CBG monitoring, ED     Status: Abnormal   Collection Time: 05/30/23  8:40 AM  Result Value Ref Range   Glucose-Capillary 455 (H) 70 - 99 mg/dL    Comment: Glucose reference range applies only to samples taken after fasting for at least 8 hours.  CBG monitoring, ED     Status: Abnormal   Collection Time: 05/30/23  9:19 AM  Result Value Ref Range   Glucose-Capillary 427 (H) 70 - 99 mg/dL    Comment: Glucose reference range applies only to samples taken after fasting for at least 8 hours.  CBG monitoring, ED     Status: Abnormal   Collection Time: 05/30/23  9:48 AM  Result Value Ref Range   Glucose-Capillary 432 (H) 70 - 99 mg/dL    Comment: Glucose reference range applies only to samples taken after fasting for at least 8 hours.  Lactic acid, plasma     Status: Abnormal   Collection Time: 05/30/23 10:05 AM  Result Value Ref Range   Lactic Acid, Venous 5.4 (HH) 0.5 - 1.9 mmol/L    Comment: CRITICAL RESULT CALLED TO, READ BACK BY AND VERIFIED WITH M.MYERS,RN [id number] 05/30/23 CLARK,S Performed at [facility], [address], [place], [place] [address]   CBG monitoring, ED     Status:  Abnormal   Collection Time: 05/30/23 10:23 AM  Result Value Ref Range   Glucose-Capillary 406 (H) 70 - 99 mg/dL    Comment: Glucose reference range applies only to samples taken after fasting for at least 8 hours.  CBG monitoring, ED     Status: Abnormal   Collection Time: 05/30/23 11:22 AM  Result Value Ref Range   Glucose-Capillary 432 (H) 70 - 99 mg/dL    Comment: Glucose reference range applies only to samples taken after fasting for at least 8 hours.  Hepatitis B surface antigen     Status: None   Collection Time: 05/30/23 11:23 AM  Result Value Ref Range   Hepatitis B Surface Ag NON REACTIVE NON REACTIVE    Comment: Performed at [facility], [address] [place], [place] [address]  CBG monitoring, ED     Status: Abnormal   Collection Time: 05/30/23 12:23 PM  Result Value Ref Range   Glucose-Capillary 311 (H) 70 - 99 mg/dL    Comment: Glucose reference range applies only to samples taken after fasting for at least 8 hours.  Basic metabolic panel     Status: Abnormal   Collection Time: 05/30/23  1:00 PM  Result Value Ref Range   Sodium 129 (L) 135 - 145 mmol/L   Potassium 3.4 (L) 3.5 - 5.1 mmol/L   Chloride 93 (L) 98 - 111 mmol/L   CO2 18 (L) 22 - 32 mmol/L   Glucose, Bld 272 (H) 70 - 99 mg/dL    Comment: Glucose reference range applies only to samples taken after fasting for at least 8 hours.   BUN 56 (H) 6 - 20 mg/dL   Creatinine, Ser 1.30 (H) 0.44 - 1.00 mg/dL   Calcium 8.1 (L) 8.9 - 10.3 mg/dL  GFR, Estimated 9 (L) >60 mL/min    Comment: (NOTE) Calculated using the CKD-EPI Creatinine Equation (2021)    Anion gap 18 (H) 5 - 15    Comment: Performed at [facility], [address] [place], [place] [address]  CBG monitoring, ED     Status: Abnormal   Collection Time: 05/30/23  1:35 PM  Result Value Ref Range   Glucose-Capillary 256 (H) 70 - 99 mg/dL    Comment: Glucose reference range applies only to samples taken after fasting for at least 8 hours.   Lactic acid, plasma     Status: Abnormal   Collection Time: 05/30/23  2:00 PM  Result Value Ref Range   Lactic Acid, Venous 4.9 (HH) 0.5 - 1.9 mmol/L    Comment: CRITICAL RESULT CALLED TO, READ BACK BY AND VERIFIED WITH Tommy Rainwater RN 05/30/2023 1545 BNUNNERY Performed at [facility], [address], [place], [place] [address]   CBG monitoring, ED     Status: Abnormal   Collection Time: 05/30/23  2:43 PM  Result Value Ref Range   Glucose-Capillary 185 (H) 70 - 99 mg/dL    Comment: Glucose reference range applies only to samples taken after fasting for at least 8 hours.  Protime-INR     Status: Abnormal   Collection Time: 05/30/23  3:08 PM  Result Value Ref Range   Prothrombin Time 19.7 (H) 11.4 - 15.2 seconds   INR 1.6 (H) 0.8 - 1.2    Comment: (NOTE) INR goal varies based on device and disease states. Performed at [facility], [address] [place], [place] [address]   APTT     Status: None   Collection Time: 05/30/23  3:08 PM  Result Value Ref Range   aPTT 35 24 - 36 seconds    Comment: Performed at [facility], [address] [place], [place] [address]  CBG monitoring, ED     Status: Abnormal   Collection Time: 05/30/23  3:52 PM  Result Value Ref Range   Glucose-Capillary 144 (H) 70 - 99 mg/dL    Comment: Glucose reference range applies only to samples taken after fasting for at least 8 hours.  I-Stat venous blood gas, ([facility], [facility], [facility])     Status: Abnormal   Collection Time: 05/30/23  4:16 PM  Result Value Ref Range   pH, Ven 7.273 7.25 - 7.43   pCO2, Ven 54.3 44 - 60 mmHg   pO2, Ven 33 32 - 45 mmHg   Bicarbonate 25.1 20.0 - 28.0 mmol/L   TCO2 27 22 - 32 mmol/L   O2 Saturation 54 %   Acid-base deficit 3.0 (H) 0.0 - 2.0 mmol/L   Sodium 130 (L) 135 - 145 mmol/L   Potassium 3.3 (L) 3.5 - 5.1 mmol/L   Calcium, Ion 1.09 (L) 1.15 - 1.40 mmol/L   HCT 40.0 36.0 - 46.0 %   Hemoglobin 13.6 12.0 - 15.0 g/dL   Sample type VENOUS    Comment NOTIFIED  PHYSICIAN    CT ABDOMEN PELVIS W CONTRAST Result Date: 05/30/2023 CLINICAL DATA:  Abdominal pain and swelling and swelling in the feet. History of end-stage renal failure and diabetes. EXAM: CT ABDOMEN AND PELVIS WITH CONTRAST TECHNIQUE: Multidetector CT imaging of the abdomen and pelvis was performed using the standard protocol following bolus administration of intravenous contrast. RADIATION DOSE REDUCTION: This exam was performed according to the departmental dose-optimization program which includes automated exposure control, adjustment of  the mA and/or kV according to patient size and/or use of iterative reconstruction technique. CONTRAST:  65mL OMNIPAQUE IOHEXOL 350 MG/ML SOLN COMPARISON:  CT without contrast 04/19/2021. FINDINGS: Lower chest: There are small right and minimal left layering pleural effusions, with atelectatic changes in both bases. There is cardiomegaly with a right chamber predominance, likely indicating chronic right heart dysfunction and also evidenced by IVC and hepatic vein contrast reflux. There are coronary artery calcifications, age-advanced. Hepatobiliary: Loss of fine detail owing to massive ascites, body wall anasarca and artifact from overlying wires and patient's arms in the field. The liver is mildly steatotic. No obvious mass is seen and no calcified gallstones or gallbladder thickening. No biliary dilatation. Pancreas: Atrophic and otherwise unremarkable. 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 No mass enhancement. No splenomegaly. Adrenals/Urinary Tract: There is bilateral renal cortical thinning. No adrenal mass or renal mass enhancement. There is air in both collecting systems which could be due to recent instrumentation or infectious process. Air in the bladder is also noted. Bladder wall is nondistended. There is no evidence of vesicoenteric fistula. Stomach/Bowel: There is mucosal enhancement of the stomach and proximal small bowel consistent  with gastroenteritis. No dilatation or wall thickening in the stomach and small bowel. The appendix is normal. There is either wall thickening or nondistention in the ascending, transverse and descending colon. Scattered sigmoid diverticula. Correlate clinically for underlying colitis. Unable to tell if there is adjacent inflammatory change due to massive ascites. Vascular/Lymphatic: Age-advanced aortic and branch vessel atherosclerosis. No AAA. No adenopathy is seen through the ascites. Reproductive: The uterus is intact. The ovaries are not enlarged. There are bilateral BTL clips, seen previously. Other: massive ascites and diffuse body wall anasarca. No free air. No free air hemorrhage or localizing collections. The ascites displaces the bowel inward and extends up around the liver and spleen. There is no incarcerated hernia. Musculoskeletal: No acute or significant osseous findings. IMPRESSION: 1. Massive ascites and diffuse body wall anasarca. 2. Small right and minimal left pleural effusions. Basilar atelectasis. 3. Cardiomegaly with right chamber predominance, likely indicating chronic right heart dysfunction. 4. Age-advanced atherosclerosis. 5. Air in the renal collecting systems and bladder which could be due to recent instrumentation or infectious process. 6. Mucosal enhancement in the stomach and proximal small bowel consistent with gastroenteritis. 7. Wall thickening versus nondistention in the ascending, transverse and descending colon. Correlate clinically for underlying colitis. 8. Diverticulosis. 9. Aortic atherosclerosis. Aortic Atherosclerosis (ICD10-I70.0). Electronically Signed   By: Almira Bar M.D.   On: 05/30/2023 02:25   DG Chest Portable 1 View Result Date: 05/30/2023 CLINICAL DATA:  Abdominal pain with extremity swelling. Check volume status. EXAM: PORTABLE CHEST 1 VIEW COMPARISON:  Portable chest 02/02/2023 FINDINGS: There is a small right pleural effusion which was seen previously,  with overlying atelectasis. Low inspiration on exam. There is increased left perihilar linear atelectasis. The hypoinflated lungs otherwise appear clear. There is no substantial left effusion. The heart silhouette is enlarged but probably exaggerated in part due to low inspiration. There is mild central vascular prominence which could be due to low lung volumes or mild perihilar vascular congestion. No edema is seen. The mediastinum is normally outlined.  The thoracic cage is intact. IMPRESSION: 1. Low inspiration on exam. 2. Small right pleural effusion with overlying atelectasis. 3. Increased left perihilar linear atelectasis. No visible infiltrate. 4. Cardiomegaly. 5. Mild central vascular prominence which could be due to low lung volumes or mild perihilar vascular congestion. No  edema is seen. Electronically Signed   By: Almira Bar M.D.   On: 05/30/2023 00:41    Pending Labs Unresulted Labs (From admission, onward)     Start     Ordered   05/30/23 1123  Hepatitis B surface antibody,quantitative  (New Admission Hemo Labs (Hepatitis B))  Once,   R        05/30/23 1124   05/30/23 0608  Hemoglobin A1c  Add-on,   AD        05/30/23 0608   Signed and Held  Renal function panel  Once,   R        Signed and Held   Signed and Held  CBC  Once,   R        Signed and Held            Vitals/Pain Today's Vitals   05/30/23 1552 05/30/23 1603 05/30/23 1630 05/30/23 1700  BP:   (!) 159/107 (!) 122/95  Pulse:   96 95  Resp:   19 17  Temp:  97.9 F (36.6 C)    TempSrc:      SpO2:   100% 100%  Weight:      Height:      PainSc: 9        Isolation Precautions No active isolations  Medications Medications  sodium zirconium cyclosilicate (LOKELMA) packet 10 g (0 g Oral Hold 05/30/23 0313)  insulin regular, human (MYXREDLIN) 100 units/ 100 mL infusion (2.6 Units/hr Intravenous New Bag/Given 05/30/23 1451)  dextrose 50 % solution 0-50 mL (has no administration in time range)  acetaminophen  (TYLENOL) tablet 650 mg (has no administration in time range)    Or  acetaminophen (TYLENOL) suppository 650 mg (has no administration in time range)  melatonin tablet 3 mg (has no administration in time range)  ondansetron (ZOFRAN) injection 4 mg (4 mg Intravenous Given 05/30/23 1159)  naloxone (NARCAN) injection 0.4 mg (has no administration in time range)  fentaNYL (SUBLIMAZE) injection 25 mcg (has no administration in time range)  LORazepam (ATIVAN) injection 0.5 mg (0.5 mg Intravenous Given 05/30/23 1638)  rosuvastatin (CRESTOR) tablet 10 mg (10 mg Oral Not Given 05/30/23 1031)  labetalol (NORMODYNE) tablet 100 mg (100 mg Oral Not Given 05/30/23 1031)  sodium bicarbonate 75 mEq in sodium chloride 0.45 % 1,075 mL infusion ( Intravenous New Bag/Given 05/30/23 1039)  cefTRIAXone (ROCEPHIN) 2 g in sodium chloride 0.9 % 100 mL IVPB (0 g Intravenous Stopped 05/30/23 1135)  metroNIDAZOLE (FLAGYL) IVPB 500 mg (0 mg Intravenous Stopped 05/30/23 1146)  Chlorhexidine Gluconate Cloth 2 % PADS 6 each (6 each Topical Not Given 05/30/23 1409)  influenza vaccine adjuvanted (FLUAD) injection 0.5 mL (has no administration in time range)  sodium chloride 0.9 % bolus 500 mL (0 mLs Intravenous Stopped 05/30/23 0228)  ondansetron (ZOFRAN) injection 4 mg (4 mg Intravenous Given 05/30/23 0045)  calcium gluconate 1 g/ 50 mL sodium chloride IVPB (0 mg Intravenous Stopped 05/30/23 0314)  insulin aspart (novoLOG) injection 10 Units (10 Units Intravenous Given 05/30/23 0311)  iohexol (OMNIPAQUE) 350 MG/ML injection 65 mL (65 mLs Intravenous Contrast Given 05/30/23 0156)  ondansetron (ZOFRAN) injection 4 mg (4 mg Intravenous Given 05/30/23 0314)  potassium chloride 10 mEq in 100 mL IVPB (0 mEq Intravenous Stopped 05/30/23 0500)  cefTRIAXone (ROCEPHIN) 1 g in sodium chloride 0.9 % 100 mL IVPB (0 g Intravenous Stopped 05/30/23 0548)    Mobility walks with person assist-baseline. Hasn't gotten up in ED  Focused  Assessments Hyperglycemia   R Recommendations: See Admitting Provider Note  Report given to:   Additional Notes: NA

## 2023-05-30 NOTE — ED Notes (Addendum)
Patient refusing to sign consent for dialysis; will not provide reasoning; attending physician, Dr. Jerolyn Center, made aware.  ED phlebotomy to come draw repeat labs.

## 2023-05-30 NOTE — Progress Notes (Signed)
32 year old female with history of type 1 diabetes end-stage renal disease on dialysis Monday Wednesday Friday admitted with nausea vomiting abdominal pain and diarrhea.  CT abdomen is consistent with massive ascites and diffuse body wall anasarca, small right and minimal left pleural effusion, basilar atelectasis, cardiomegaly with right chamber predominance likely indicating chronic right heart dysfunction, mucosal enhancement in the stomach and proximal small bowel consistent with gastroenteritis, wall thickening versus nondistention of the ascending transverse and descending colon, diverticulosis. Albumin 2.6, sodium 127, glucose 592, CO2 19, creatinine 6.51, AST 15 ALT 10 total bilirubin 1.4 VBG 7.1 11/22/1934 She is admitted for severe hyperglycemia she is not ketotic urine ketones negative, missed dialysis twice with fluid overload severe ascites bilateral pleural effusions, metabolic acidosis on bicarb, insulin status post diagnostic paracentesis Will let nephrology team know of patient's admission for dialysis today Discussed with her sister Precious. She was started on Rocephin to cover intra-abdominal etiology like possible SBP I have added Flagyl with findings of colitis Follow-up VBG Patient very drowsy seen in the ER Abdomen distended nontender

## 2023-05-30 NOTE — ED Notes (Signed)
Per lab, patient's bloodwork is hemolyzed; ED Phlebotomy to come attempt to obtain labs.

## 2023-05-30 NOTE — ED Notes (Signed)
EDP at bedside  

## 2023-05-30 NOTE — ED Notes (Addendum)
Unsuccessful attempts at drawing blood/labs on patient. Spoke with phlebotomy about attempting drawing labs, stated they would be around as soon as possible. EDP notified.

## 2023-05-30 NOTE — ED Notes (Signed)
Order for D5 infusion discontinued by nephrology PA. Attending physician, Dr. Jerolyn Center, aware of patient's current CBG and that there is no active order for D5 infusion. ED pharmacist, Sheran Lawless, also aware of situation.

## 2023-05-30 NOTE — H&P (Signed)
History and Physical      Ann Robertson ZOX:[id number] DOB: 08-Apr-1991 DOA: 05/29/2023; DOS: 05/30/2023  PCP: Ann Pacini, FNP  Patient coming from: home   I have personally briefly reviewed patient's old medical records in [facility]  Chief Complaint: Nausea vomiting  HPI: Ann Robertson is a 32 y.o. female with medical history significant for type 1 diabetes mellitus, incisional disease on hemodialysis, Monday, Wednesday, Friday schedule, essential pretension, hyperlipidemia, anemia of chronic kidney disease associated baseline hemoglobin 9.5-12.5, who is admitted to [facility] on 05/29/2023 with hyperglycemia after presenting from home to [facility] ED complaining of nausea/vomiting.   The patient reports 1 day of recurrent nausea resulting in at least 4-5 episodes of nonbloody, nonbilious emesis over that timeframe, associated with inability to tolerate her outpatient oral medications over the last day, he had associated very limited oral intake over that timeframe.  This has been associated with Generalized crampy abdominal discomfort followed by 2-3 episodes of loose watery stool, in the absence of any melena or hematochezia.  Denies any associated subjective fever, chills or rigors, generalized myalgias.  No recent chest pain, shortness of breath, cough, rash, neck stiffness.  She reports that in spite of her incisional disease, she does produce a small amount of urine, denies any recent dysuria.   In the setting of her nausea, vomiting, abdominal pain, followed by loose stool over the last 1 day, patient reports that she is not taking any of her basal or short acting insulin in the last 24 hours.  Her medical history is notable for end-stage renal disease on hemodialysis, typically on a Monday: C, Friday schedule.  She notes that she missed 2 of her hemodialysis sessions last week, but was able to attend her most recent scheduled HD session, which  occurred on Saturday, 05/28/2023, noting the Saturday session occurred as a consequence of holiday schedule.  Per my chart review, no prior echocardiogram on file.  Per my initial chart review, no prior diagnosis of cirrhosis.    ED Course:  Vital signs in the ED were notable for the following: Afebrile; heart rates in the 90s to low 100s; systolic blood pressures in the 150s to 190s; respiratory rate 14-24, oxygen saturation 98 to 99% on room air.  Labs were notable for the following: CBG greater than 600.  CMP notable for sodium 122 which corrects to approximately 135 when taking into account concomitant hyperglycemia, potassium 4.4, bicarbonate 19, anion gap 16, glucose 99.  Calcium adjusted for mild hypoalbuminemia noted to be 9.1, albumin 2.6, total bilirubin 0.4.  Otherwise, liver enzymes are within normal limits.  Lipase 22.  CBC notable for white blood cell count 7100, hemoglobin 10.2 associated Neuraceq/recurrent properties and nonelevated RDW and relative to most recent prior hemoglobin data point of 10.5 on 02/03/2023.  Beta-hydroxybutyrate acid 1.06.  I-STAT VBG 7.387/40.  Urinalysis ordered, result currently pending.  COVID, influenza, RSV PCR were all negative.  Of note, in the setting of CT evidence of ascites, EDP performed diagnostic paracentesis, with peritoneal fluid analysis currently pending.  Per my interpretation, EKG in ED demonstrated the following: Sinus rhythm with heart rate 98, normal intervals, no evidence of T wave or ST changes.  Imaging in the ED, per corresponding formal radiology read, was notable for the following: 1 view chest x-ray showed low inspiratory exam, with small right pleural effusion, no evidence of infiltrate, edema, or pneumothorax.  CT abdomen/pelvis showed mucosal enhancement in the stomach and proximal small  bowel consistent with gastroenteritis, in addition to demonstrating wall thickening in the ascending, transverse, and descending colon,  suggestive of colitis, the absence of any evidence of obstruction, abscess, or perforation.  CT abdomen/pelvis also showed evidence of ascites, without mention of cirrhotic appearing liver, no evidence of acute cholecystitis, or any evidence of acute pancreatitis.  While in the ED, the following were administered: NovoLog 10 units IV x 1, Zofran 4 mg IV x 2 doses, Rocephin for empiric coverage of SBP pending peritoneal fluid analysis, lactated Ringer's at 125 cc/h preceded by normal saline x 500 cc bolus.  Potassium chloride 10 mill equivalents IV, as well as initiation of insulin drip via Endo tool.  Subsequently, the patient was admitted for further evaluation management presenting hyperglycemia without overt evidence of DKA, in the context of suspected gastroenterocolitis, in addition to finding of ascites status post diagnostic paracentesis performed in the ED today.    Review of Systems: As per HPI otherwise 10 point review of systems negative.   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   Neuropathy    Preeclampsia    STD (sexually transmitted disease)     Past Surgical History:  Procedure Laterality Date   AV FISTULA PLACEMENT Left 06/19/2021   Procedure: INSERTION OF ARTERIOVENOUS (AV) GORE-TEX GRAFT ARM;  Surgeon: Chuck Hint, MD;  Location: [facility] OR;  Service: Vascular;  Laterality: Left;   CESAREAN SECTION     CESAREAN SECTION N/A 07/05/2013   Procedure: CESAREAN SECTION;  Surgeon: Allie Bossier, MD;  Location: [facility] ORS;  Service: Obstetrics;  Laterality: N/A;   CESAREAN SECTION N/A 07/25/2014   Procedure: CESAREAN SECTION;  Surgeon: Reva Bores, MD;  Location: [facility] ORS;  Service: Obstetrics;  Laterality: N/A;   CESAREAN SECTION N/A 11/21/2015   Procedure: CESAREAN SECTION;  Surgeon: Reva Bores, MD;  Location: [facility] BIRTHING SUITES;  Service: Obstetrics;   Laterality: N/A;   IR FLUORO GUIDE CV LINE RIGHT  06/16/2021   IR US GUIDE VASC ACCESS RIGHT  06/16/2021   WISDOM TOOTH EXTRACTION      Social History:  reports that she has quit smoking. Her smoking use included cigarettes. She has a 2.3 pack-year smoking history. She has never used smokeless tobacco. She reports that she does not currently use alcohol. She reports that she does not use drugs.   Allergies  Allergen Reactions   Dilaudid [Hydromorphone] Itching and Rash    Family History  Problem Relation Age of Onset   Stroke Mother    Hypertension Mother    Heart disease Mother    Kidney disease Mother    Hyperlipidemia Father    Diabetes Father     Family history reviewed and not pertinent    Prior to Admission medications   Medication Sig Start Date End Date Taking? Authorizing Provider  polycarbophil (FIBERCON) 625 MG tablet Take 1 tablet (625 mg total) by mouth 2 (two) times daily. 02/03/23   Carmina Miller, DO  furosemide (LASIX) 80 MG tablet Take 80 mg by mouth daily.    [provider]  gabapentin (NEURONTIN) 100 MG capsule Take 1 capsule (100 mg total) by mouth 3 (three) times daily as needed (neuropathy). 02/03/23   Carmina Miller, DO  hydrOXYzine (ATARAX) 25 MG tablet Take 25 mg by mouth.    [provider]  insulin glargine (LANTUS SOLOSTAR) 100 UNIT/ML Solostar Pen Inject 3 Units into the skin daily.    [provider]  insulin lispro (HUMALOG) 100 UNIT/ML KwikPen Inject 0-6 Units into the skin 3 (three) times daily with meals. Check Blood Glucose (BG) and inject per scale: BG <150= 0 unit; BG 150-200= 1 unit; BG 201-250= 2 unit; BG 251-300= 3 unit; BG 301-350= 4 unit; BG 351-400= 5 unit; BG >400= 6 unit and Call Primary Care. 02/03/23   Carmina Miller, DO  labetalol (NORMODYNE) 100 MG tablet Take 1 tablet (100 mg total) by mouth 2 (two) times daily. 02/03/23   Carmina Miller, DO  lidocaine-prilocaine (EMLA) cream Apply 1 Application topically as needed  (For port access). 02/03/23   Carmina Miller, DO  lipase/protease/amylase (CREON) 36000 UNITS CPEP capsule Take 2 capsules (72,000 Units total) by mouth 3 (three) times daily with meals. May also take 1 capsule (36,000 Units total) as needed (with snacks - up to 4 snacks daily). 02/14/23   Carmina Miller, DO  loperamide (IMODIUM) 2 MG capsule Take 2 capsules (4 mg total) by mouth as needed for diarrhea or loose stools. Not to exceed 8 caps per day 02/03/23   Carmina Miller, DO  methocarbamol (ROBAXIN) 750 MG tablet Take 1 tablet (750 mg total) by mouth every 8 (eight) hours as needed for muscle spasms. 02/03/23   Carmina Miller, DO  omeprazole (PRILOSEC) 20 MG capsule Take 1 capsule (20 mg total) by mouth daily as needed (acid reflux/heartburn). 02/03/23   Carmina Miller, DO  oxyCODONE-acetaminophen (PERCOCET) 10-325 MG tablet Take 1 tablet by mouth every 6 (six) hours as needed for pain.    [provider]  rosuvastatin (CRESTOR) 10 MG tablet Take 1 tablet (10 mg total) by mouth daily. 02/04/23   Carmina Miller, DO  Acidophilus Lactobacillus CAPS Take 1 capsule by mouth 3 (three) times daily with meals. 02/03/23   Carmina Miller, DO     Objective    Physical Exam: Vitals:   05/30/23 0341 05/30/23 0345 05/30/23 0500 05/30/23 0530  BP:  (!) 154/88 (!) 183/108 (!) 137/94  Pulse:  (!) 104 (!) 105 98  Resp:  14 16 10   Temp: 97.7 F (36.5 C)     TempSrc:      SpO2:  98% 98% 98%  Weight:      Height:        General: appears to be stated age; alert, oriented Skin: warm, dry, no rash Head:  AT/[place] Mouth:  Oral mucosa membranes appear dry, normal dentition Neck: supple; trachea midline Heart:  RRR; did not appreciate any M/R/G Lungs: CTAB, did not appreciate any wheezes, rales, or rhonchi Abdomen: + BS; soft, ND, generalized tenderness, in the absence of any associated guarding, rigidity, or rebound tenderness. Vascular: 2+ pedal pulses b/l; 2+ radial pulses b/l Extremities: no peripheral edema, no  muscle wasting Neuro: strength and sensation intact in upper and lower extremities b/l    Labs on Admission: I have personally reviewed following labs and imaging studies  CBC: Recent Labs  Lab 05/30/23 0114 05/30/23 0128 05/30/23 0129  WBC 7.1  --   --   NEUTROABS 5.5  --   --   HGB 10.2* 12.9 12.9  HCT 32.0* 38.0 38.0  MCV 88.2  --   --   PLT 106*  --   --    Basic Metabolic Panel: Recent Labs  Lab 05/30/23 0128 05/30/23 0129 05/30/23 0200  NA 120* 120* 122*  K 6.5* 6.6*  4.4  CL  --  89* 87*  CO2  --   --  19*  GLUCOSE  --  >700* 909*  BUN  --  83* 57*  CREATININE  --  6.80* 6.58*  CALCIUM  --   --  7.9*   GFR: Estimated Creatinine Clearance: 9.5 mL/min (A) (by C-G formula based on SCr of 6.58 mg/dL (H)). Liver Function Tests: Recent Labs  Lab 05/30/23 0200  AST 15  ALT 10  ALKPHOS 134*  BILITOT 1.4*  PROT 6.2*  ALBUMIN 2.6*   Recent Labs  Lab 05/30/23 0200  LIPASE 22   No results for input(s): "AMMONIA" in the last 168 hours. Coagulation Profile: No results for input(s): "INR", "PROTIME" in the last 168 hours. Cardiac Enzymes: No results for input(s): "CKTOTAL", "CKMB", "CKMBINDEX", "TROPONINI" in the last 168 hours. BNP (last 3 results) No results for input(s): "PROBNP" in the last 8760 hours. HbA1C: No results for input(s): "HGBA1C" in the last 72 hours. CBG: Recent Labs  Lab 05/30/23 0234 05/30/23 0344 05/30/23 0423 05/30/23 0504 05/30/23 0602  GLUCAP >600* >600* >600* >600* 585*   Lipid Profile: No results for input(s): "CHOL", "HDL", "LDLCALC", "TRIG", "CHOLHDL", "LDLDIRECT" in the last 72 hours. Thyroid Function Tests: No results for input(s): "TSH", "T4TOTAL", "FREET4", "T3FREE", "THYROIDAB" in the last 72 hours. Anemia Panel: No results for input(s): "VITAMINB12", "FOLATE", "FERRITIN", "TIBC", "IRON", "RETICCTPCT" in the last 72 hours. Urine analysis:    Component Value Date/Time   COLORURINE YELLOW 02/21/2022 1724    APPEARANCEUR CLEAR 02/21/2022 1724   LABSPEC 1.020 02/21/2022 1724   PHURINE 5.0 02/21/2022 1724   GLUCOSEU >=500 (A) 02/21/2022 1724   HGBUR SMALL (A) 02/21/2022 1724   BILIRUBINUR NEGATIVE 02/21/2022 1724   KETONESUR 5 (A) 02/21/2022 1724   PROTEINUR >=300 (A) 02/21/2022 1724   UROBILINOGEN 0.2 11/06/2015 1011   NITRITE NEGATIVE 02/21/2022 1724   LEUKOCYTESUR NEGATIVE 02/21/2022 1724    Radiological Exams on Admission: CT ABDOMEN PELVIS W CONTRAST Result Date: 05/30/2023 CLINICAL DATA:  Abdominal pain and swelling and swelling in the feet. History of end-stage renal failure and diabetes. EXAM: CT ABDOMEN AND PELVIS WITH CONTRAST TECHNIQUE: Multidetector CT imaging of the abdomen and pelvis was performed using the standard protocol following bolus administration of intravenous contrast. RADIATION DOSE REDUCTION: This exam was performed according to the departmental dose-optimization program which includes automated exposure control, adjustment of the mA and/or kV according to patient size and/or use of iterative reconstruction technique. CONTRAST:  65mL OMNIPAQUE IOHEXOL 350 MG/ML SOLN COMPARISON:  CT without contrast 04/19/2021. FINDINGS: Lower chest: There are small right and minimal left layering pleural effusions, with atelectatic changes in both bases. There is cardiomegaly with a right chamber predominance, likely indicating chronic right heart dysfunction and also evidenced by IVC and hepatic vein contrast reflux. There are coronary artery calcifications, age-advanced. Hepatobiliary: Loss of fine detail owing to massive ascites, body wall anasarca and artifact from overlying wires and patient's arms in the field. The liver is mildly steatotic. No obvious mass is seen and no calcified gallstones or gallbladder thickening. No biliary dilatation. Pancreas: Atrophic and otherwise unremarkable. 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 No mass enhancement. No splenomegaly.  Adrenals/Urinary Tract: There is bilateral renal cortical thinning. No adrenal mass or renal mass enhancement. There is air in both collecting systems which could be due to recent instrumentation or infectious process. Air in the bladder is also noted. Bladder wall is nondistended. There is no evidence of  vesicoenteric fistula. Stomach/Bowel: There is mucosal enhancement of the stomach and proximal small bowel consistent with gastroenteritis. No dilatation or wall thickening in the stomach and small bowel. The appendix is normal. There is either wall thickening or nondistention in the ascending, transverse and descending colon. Scattered sigmoid diverticula. Correlate clinically for underlying colitis. Unable to tell if there is adjacent inflammatory change due to massive ascites. Vascular/Lymphatic: Age-advanced aortic and branch vessel atherosclerosis. No AAA. No adenopathy is seen through the ascites. Reproductive: The uterus is intact. The ovaries are not enlarged. There are bilateral BTL clips, seen previously. Other: massive ascites and diffuse body wall anasarca. No free air. No free air hemorrhage or localizing collections. The ascites displaces the bowel inward and extends up around the liver and spleen. There is no incarcerated hernia. Musculoskeletal: No acute or significant osseous findings. IMPRESSION: 1. Massive ascites and diffuse body wall anasarca. 2. Small right and minimal left pleural effusions. Basilar atelectasis. 3. Cardiomegaly with right chamber predominance, likely indicating chronic right heart dysfunction. 4. Age-advanced atherosclerosis. 5. Air in the renal collecting systems and bladder which could be due to recent instrumentation or infectious process. 6. Mucosal enhancement in the stomach and proximal small bowel consistent with gastroenteritis. 7. Wall thickening versus nondistention in the ascending, transverse and descending colon. Correlate clinically for underlying colitis. 8.  Diverticulosis. 9. Aortic atherosclerosis. Aortic Atherosclerosis (ICD10-I70.0). Electronically Signed   By: Almira Bar M.D.   On: 05/30/2023 02:25   DG Chest Portable 1 View Result Date: 05/30/2023 CLINICAL DATA:  Abdominal pain with extremity swelling. Check volume status. EXAM: PORTABLE CHEST 1 VIEW COMPARISON:  Portable chest 02/02/2023 FINDINGS: There is a small right pleural effusion which was seen previously, with overlying atelectasis. Low inspiration on exam. There is increased left perihilar linear atelectasis. The hypoinflated lungs otherwise appear clear. There is no substantial left effusion. The heart silhouette is enlarged but probably exaggerated in part due to low inspiration. There is mild central vascular prominence which could be due to low lung volumes or mild perihilar vascular congestion. No edema is seen. The mediastinum is normally outlined.  The thoracic cage is intact. IMPRESSION: 1. Low inspiration on exam. 2. Small right pleural effusion with overlying atelectasis. 3. Increased left perihilar linear atelectasis. No visible infiltrate. 4. Cardiomegaly. 5. Mild central vascular prominence which could be due to low lung volumes or mild perihilar vascular congestion. No edema is seen. Electronically Signed   By: Almira Bar M.D.   On: 05/30/2023 00:41      Assessment/Plan   Principal Problem:   Hyperglycemia Active Problems:   Type 1 diabetes mellitus (HCC)   Nausea & vomiting   Essential hypertension   History of anemia due to chronic kidney disease   HLD (hyperlipidemia)   End-stage renal disease on hemodialysis (HCC)   Ascites   Gastroenteritis    #) Hyperglycemia: In context of a history of poorly controlled type 1 diabetes mellitus, with most recent hemoglobin A1c found to be 14.3% in September 2023.  With the patient missing all of her basal insulin as well as short acting insulin dosing over the course of the last 1 day, presenting blood sugar noted to  be greater than 900.  Additional laboratory findings notable for minimal elevation in anion gap at 16, with mild elevation in beta-hydroxybutyrate acid at 1.06.  However, presentation appears less consistent with DKA in the absence of evidence of metabolic acidosis on presenting i-STAT VBG, with normal pH noted.  However, this was  an i-STAT VBG, and I will order a regular VBG along with repeat beta-hydroxybutyrate acid to further evaluate for underlying DKA.  In terms of other factors contributing to her hyperglycemia in addition to poor compliance with her basal interacting insulin over the course the last day, there is also an element of dehydration in the setting of recurrent nausea/vomiting/diarrhea over the course of the last day, as well as potential hyperglycemic impact of suspected viral infection in the form of evidence of gastroenterocolitis.  No additional infectious process identified at this time, including COVID, influenza, RSV PCR which were all negative.  Chest x-ray shows no evidence of infiltrate.  Urinalysis is currently pending.  Started on insulin drip via Endo tool.  Plan: Continue insulin drip via Endo tool.  Every 1 hour CBGs.  Every 4 hours BMPs.  Check VBG and repeat Betatrex.  Uric acid at this time to evaluate for underlying DKA, as further detailed above.  Continuous lactated Ringer's, with plan to switch to D5LR once CBG less than 250.  Closely follow for ensuing potassium trend via every 4 hours BMPs.  Add on hemoglobin A1c level.  I placed consult order for diabetic educator.  Follow-up result urinalysis.  Add on procalcitonin level.  Further evaluation management of suspected viral gastroenterocolitis, as below.  Additionally, follow-up for result of peritoneal fluid analysis.                #) Gastroenterocolitis: Suspected diagnosis on the basis of 1 day of nausea/vomiting with abdominal discomfort followed by development of loose stool, which correlates with CT  abdomen/pelvis which shows findings consistent with gastroenteritis as well as colitis, in the absence of evidence of obstruction, abscess, or perforation.  Suspect underlying viral etiology.   Plan: IV fluids, as above.  Prn IV Zofran, prn IV Ativan for refractory nausea/vomiting.  Prn IV fentanyl for abdominal discomfort.  Repeat CMP, CBC in the morning.  Monitor strict I's and O's and daily weights.                   #) Ascites: Noted on today's CT abdomen/pelvis.  Per my initial chart review, have not encountered a underlying diagnosis of cirrhosis.  No prior echocardiogram on file.  At this time, the source of her ascites is unclear to me.  Potential mild volume overload in the setting of 2 recently missed hemodialysis sessions could be a contributing factor.  CT abdomen/pelvis made no mention of underlying cirrhotic appearance of liver.  EDP has performed diagnostic paracentesis this evening, with peritoneal fluid analysis currently pending.  Plan: Follow-up result.  Fluid analysis, as above.  Check INR, PTT, serum ethanol level, serum acetaminophen level.  Follow-up result of urinalysis, including to evaluate for evidence of significant proteinuria.  Check echocardiogram.  Monitor strict I's and O's and daily weights.                     #) ESRD: on HD (schedule: Monday, Wednesday, Friday schedule). Next due for routine HD on Monday, 05/30/2023. No clinical evidence for urgent overnight HD or to expedite HD relative to this timeframe.  It is noted that her most recently completed hemodialysis session occurred on Saturday, 05/28/2023 as a consequence of holiday schedule.  This was after missing 2 of her scheduled hemodialysis sessions over the previous week.  Plan: monitor strict I's/O's, daily weights. CMP in the AM. Check mag and phos levels. Will be due for routine scheduled hemodialysis later today.                     #)  Hyperlipidemia:  documented h/o such. On rosuvastatin as outpatient.   Plan: continue home statin.                   #) Essential Hypertension: documented h/o such, with outpatient antihypertensive regimen including labetalol, Lasix.  SBP's in the ED today: 150s to 190s mmHg. contribution towards elevated blood pressure from inability to tolerate her home oral medications over the last day in the setting of nausea/vomiting.  Plan: Close monitoring of subsequent BP via routine VS. for now, will resume home antihypertensive medications in the setting of improving nausea/vomiting.  Monitor strict I's and O's and daily weights.  Monitor on telemetry.                  #) Anemia of chronic kidney disease: Documented history of such, a/w with baseline hgb range 9.5-12.5, with presenting hgb consistent with this range, in the absence of any overt evidence of active bleed.     Plan: Repeat CBC in the morning.  Check PTT, INR.       DVT prophylaxis: SCD's   Code Status: Full code Family Communication: none Disposition Plan: Per Rounding Team Consults called: none;  Admission status: Inpatient     I SPENT GREATER THAN 75  MINUTES IN CLINICAL CARE TIME/MEDICAL DECISION-MAKING IN COMPLETING THIS ADMISSION.      Chaney Born Syd Newsome DO [facility]  From 7PM - 7AM   05/30/2023, 6:16 AM

## 2023-05-31 DIAGNOSIS — R739 Hyperglycemia, unspecified: Secondary | ICD-10-CM | POA: Diagnosis not present

## 2023-05-31 LAB — GLUCOSE, CAPILLARY
Glucose-Capillary: 118 mg/dL — ABNORMAL HIGH (ref 70–99)
Glucose-Capillary: 120 mg/dL — ABNORMAL HIGH (ref 70–99)
Glucose-Capillary: 126 mg/dL — ABNORMAL HIGH (ref 70–99)
Glucose-Capillary: 60 mg/dL — ABNORMAL LOW (ref 70–99)
Glucose-Capillary: 62 mg/dL — ABNORMAL LOW (ref 70–99)
Glucose-Capillary: 72 mg/dL (ref 70–99)
Glucose-Capillary: 76 mg/dL (ref 70–99)
Glucose-Capillary: 76 mg/dL (ref 70–99)
Glucose-Capillary: 78 mg/dL (ref 70–99)
Glucose-Capillary: 86 mg/dL (ref 70–99)
Glucose-Capillary: 91 mg/dL (ref 70–99)

## 2023-05-31 LAB — BASIC METABOLIC PANEL
Anion gap: 16 — ABNORMAL HIGH (ref 5–15)
BUN: 59 mg/dL — ABNORMAL HIGH (ref 6–20)
CO2: 24 mmol/L (ref 22–32)
Calcium: 8.1 mg/dL — ABNORMAL LOW (ref 8.9–10.3)
Chloride: 89 mmol/L — ABNORMAL LOW (ref 98–111)
Creatinine, Ser: 6.61 mg/dL — ABNORMAL HIGH (ref 0.44–1.00)
GFR, Estimated: 8 mL/min — ABNORMAL LOW (ref >60)
Glucose, Bld: 95 mg/dL (ref 70–99)
Potassium: 3.7 mmol/L (ref 3.5–5.1)
Sodium: 129 mmol/L — ABNORMAL LOW (ref 135–145)

## 2023-05-31 LAB — HEMOGLOBIN A1C
Hgb A1c MFr Bld: >15.5 % — ABNORMAL HIGH (ref 4.8–5.6)
Mean Plasma Glucose: >398 mg/dL

## 2023-05-31 MED ORDER — LIDOCAINE HCL (PF) 1 % IJ SOLN: 5.0000 mL | INTRAMUSCULAR | Status: DC | PRN

## 2023-05-31 MED ORDER — LIDOCAINE-PRILOCAINE 2.5-2.5 % EX CREA: 1.0000 | TOPICAL_CREAM | CUTANEOUS | Status: DC | PRN

## 2023-05-31 MED ORDER — ANTICOAGULANT SODIUM CITRATE 4% (200MG/5ML) IV SOLN
5.0000 mL | Status: DC | PRN
  Filled 2023-05-31: qty 5

## 2023-05-31 MED ORDER — HEPARIN SODIUM (PORCINE) 5000 UNIT/ML IJ SOLN
5000.0000 [IU] | Freq: Three times a day (TID) | INTRAMUSCULAR | Status: DC
  Administered 2023-05-31: 5000 [IU] via SUBCUTANEOUS
  Filled 2023-05-31 (×2): qty 1

## 2023-05-31 MED ORDER — OXYCODONE HCL 5 MG PO TABS
5.0000 mg | ORAL_TABLET | Freq: Four times a day (QID) | ORAL | Status: DC | PRN
  Administered 2023-05-31 – 2023-06-02 (×5): 5 mg via ORAL
  Filled 2023-05-31 (×5): qty 1

## 2023-05-31 MED ORDER — DEXTROSE 50 % IV SOLN
INTRAVENOUS | Status: AC
  Filled 2023-05-31: qty 50

## 2023-05-31 MED ORDER — INSULIN ASPART 100 UNIT/ML IJ SOLN: 0.0000 [IU] | Freq: Four times a day (QID) | INTRAMUSCULAR | Status: DC

## 2023-05-31 MED ORDER — CHLORHEXIDINE GLUCONATE CLOTH 2 % EX PADS
6.0000 | MEDICATED_PAD | Freq: Every day | CUTANEOUS | Status: DC
  Administered 2023-06-02: 6 via TOPICAL

## 2023-05-31 MED ORDER — ALTEPLASE 2 MG IJ SOLR: 2.0000 mg | Freq: Once | INTRAMUSCULAR | Status: DC | PRN

## 2023-05-31 MED ORDER — PENTAFLUOROPROP-TETRAFLUOROETH EX AERO: 1.0000 | INHALATION_SPRAY | CUTANEOUS | Status: DC | PRN

## 2023-05-31 MED ORDER — HEPARIN SODIUM (PORCINE) 1000 UNIT/ML DIALYSIS: 1000.0000 [IU] | INTRAMUSCULAR | Status: DC | PRN

## 2023-05-31 NOTE — Progress Notes (Signed)
 PROGRESS NOTE    Ann Robertson  FMW:[id number] DOB: 09/07/1990 DOA: 05/29/2023 PCP: Elizbeth Leita Ruth, FNP   Brief Narrative: 32 year old female type I diabetic on dialysis Monday Wednesday Friday she missed 2 dialysis sessions last week she comes in with nausea and vomiting and abdominal distention.  Other medical history includes hypertension hyperlipidemia.  She was found to be in hyperglycemia no DKA and fluid overloaded with abdominal distention massive ascites.  She was admitted for IV insulin  drip and dialysis.  She is off of the insulin  drip on 05/31/2023 and she will get her dialysis today.  Workup did not reveal any evidence of infection.  However she was started on Rocephin  to cover for SBP with large ascites and abdominal pain.  ED physician perform diagnostic paracentesis CT abdomen and pelvis- showed mucosal enhancement in the stomach and proximal small bowel consistent with gastroenteritis, in addition to demonstrating wall thickening in the ascending, transverse, and descending colon, suggestive of colitis, the absence of any evidence of obstruction, abscess, or perforation, evidence of ascites, without mention of cirrhotic appearing liver, no evidence of acute cholecystitis, or any evidence of acute pancreatitis.   Assessment & Plan:   Principal Problem:   Hyperglycemia Active Problems:   Type 1 diabetes mellitus (HCC)   Nausea & vomiting   Essential hypertension   History of anemia due to chronic kidney disease   HLD (hyperlipidemia)   End-stage renal disease on hemodialysis (HCC)   Ascites   Gastroenteritis   #1 hyperglycemia with nausea vomiting abdominal distention(no DKA) she was started on Endo tool and insulin  drip on admission.  Insulin  drip was stopped 05/31/2023 due to frequent hypoglycemia and patient unable to eat due to decreased mental status plus nausea and vomiting which has improved.  She was placed on D10 at a low rate she started on a carb  modified diet today. She has been started on subcu short acting insulin  (due to persistent hypoglycemia we have not given her basal insulin  yet)  #2 gastroenterocolitis CT correlates with the same and patient admitted with nausea vomiting and abdominal pain and diarrhea.  This has improved since admission.  Continue supportive measures with Zofran  and Ativan  On Rocephin  and Flagyl   #3 large ascites likely from missing 2 days of dialysis sessions in a row.  She is due to her dialysis today hopefully this will improve.  No evidence of cirrhosis by CT.  Peritoneal fluid culture is pending, others fluid studies suggest possible transudate.  #4 ESRD on dialysis Monday Wednesday Friday she misses dialysis more frequently Discussed with her sister who reported that patient lives with her ex husband who sometimes take her for dialysis and now that he started working she has issues with transportation for dialysis.  #5 hyperlipidemia on rosuvastatin   #6 hypertension on labetalol  Was on Lasix  at home which have not restarted as patient with hypotension and soft BP on admission Consider restarting tomorrow  #7 anemia chronic kidney disease stable  #8 hyponatremia initially pseudohyponatremia and now possibly due to decreased p.o. intake as well as hypotonic fluid infusion  #9 high gap metabolic acidosis due to CKD and missing dialysis plus hyperglycemia improved on bicarb drip  Pressure Injury 06/04/22 Ischial tuberosity Right Deep Tissue Pressure Injury - Purple or maroon localized area of discolored intact skin or blood-filled blister due to damage of underlying soft tissue from pressure and/or shear. (Active)  06/04/22 1340  Location: Ischial tuberosity  Location Orientation: Right  Staging: Deep Tissue Pressure Injury -  Purple or maroon localized area of discolored intact skin or blood-filled blister due to damage of underlying soft tissue from pressure and/or shear.  Wound Description  (Comments):   Present on Admission: Yes    Estimated body mass index is 20.32 kg/m as calculated from the following:   Height as of this encounter: 5' 5 (1.651 m).   Weight as of this encounter: 55.4 kg.  DVT prophylaxis: Heparin  code Status: Full code  family Communication: Discussed with her sister yesterday  disposition Plan:  Status is: Inpatient Remains inpatient appropriate because: Acute illness   Consultants:  Nephrology  Procedures: None  antimicrobials: Rocephin  and Flagyl   Subjective: She is resting in bed she is still drowsy but more awake than yesterday she is able to answer questions appropriately and follow commands continues to say she is not hungry and she does not want to eat no nausea vomiting reported overnight  Objective: Vitals:   05/31/23 0500 05/31/23 0751 05/31/23 0753 05/31/23 1150  BP:  (!) 143/99  112/82  Pulse:  (!) 103  100  Resp:  15  15  Temp:  98.1 F (36.7 C) 98.1 F (36.7 C) 98.4 F (36.9 C)  TempSrc:  Oral  Oral  SpO2:  99%  100%  Weight: 55.4 kg     Height:        Intake/Output Summary (Last 24 hours) at 05/31/2023 1417 Last data filed at 05/31/2023 0700 Gross per 24 hour  Intake 1536.1 ml  Output --  Net 1536.1 ml   Filed Weights   05/29/23 2353 05/30/23 1827 05/31/23 0500  Weight: 49.9 kg 54.9 kg 55.4 kg    Examination:  General exam: Appears in no acute distress  respiratory system: Clear to auscultation. Respiratory effort normal. Cardiovascular system: S1 & S2 heard, RRR. No JVD, murmurs, rubs, gallops or clicks. No pedal edema. Gastrointestinal system: Abdomen is distended, soft and tender. No organomegaly or masses felt. Normal bowel sounds heard. Central nervous system: Drowsy but easily arousable and follows commands  extremities: 1+ edema  Data Reviewed: I have personally reviewed following labs and imaging studies  CBC: Recent Labs  Lab 05/30/23 0114 05/30/23 0128 05/30/23 0129 05/30/23 1616  WBC  7.1  --   --   --   NEUTROABS 5.5  --   --   --   HGB 10.2* 12.9 12.9 13.6  HCT 32.0* 38.0 38.0 40.0  MCV 88.2  --   --   --   PLT 106*  --   --   --    Basic Metabolic Panel: Recent Labs  Lab 05/30/23 0129 05/30/23 0200 05/30/23 0810 05/30/23 1300 05/30/23 1616 05/31/23 0657  NA 120* 122* 127* 129* 130* 129*  K 6.6* 4.4 3.7 3.4* 3.3* 3.7  CL 89* 87* 90* 93*  --  89*  CO2  --  19* 19* 18*  --  24  GLUCOSE >700* 909* 592* 272*  --  95  BUN 83* 57* 57* 56*  --  59*  CREATININE 6.80* 6.58* 6.51* 6.13*  --  6.61*  CALCIUM   --  7.9* 8.6* 8.1*  --  8.1*  MG  --   --  1.8  --   --   --   PHOS  --   --  4.9*  --   --   --    GFR: Estimated Creatinine Clearance: 10.5 mL/min (A) (by C-G formula based on SCr of 6.61 mg/dL (H)). Liver Function Tests: Recent Labs  Lab  05/30/23 0200  AST 15  ALT 10  ALKPHOS 134*  BILITOT 1.4*  PROT 6.2*  ALBUMIN  2.6*   Recent Labs  Lab 05/30/23 0200  LIPASE 22   No results for input(s): AMMONIA in the last 168 hours. Coagulation Profile: Recent Labs  Lab 05/30/23 1508  INR 1.6*   Cardiac Enzymes: No results for input(s): CKTOTAL, CKMB, CKMBINDEX, TROPONINI in the last 168 hours. BNP (last 3 results) No results for input(s): PROBNP in the last 8760 hours. HbA1C: Recent Labs    05/30/23 0112  HGBA1C >15.5*   CBG: Recent Labs  Lab 05/31/23 0536 05/31/23 0646 05/31/23 0750 05/31/23 0823 05/31/23 1021  GLUCAP 76 72 62* 120* 86   Lipid Profile: No results for input(s): CHOL, HDL, LDLCALC, TRIG, CHOLHDL, LDLDIRECT in the last 72 hours. Thyroid  Function Tests: No results for input(s): TSH, T4TOTAL, FREET4, T3FREE, THYROIDAB in the last 72 hours. Anemia Panel: No results for input(s): VITAMINB12, FOLATE, FERRITIN, TIBC, IRON, RETICCTPCT in the last 72 hours. Sepsis Labs: Recent Labs  Lab 05/30/23 0810 05/30/23 1005 05/30/23 1400  PROCALCITON 34.14  --   --   LATICACIDVEN  --   5.4* 4.9*    Recent Results (from the past 240 hours)  Resp panel by RT-PCR (RSV, Flu A&B, Covid) Anterior Nasal Swab     Status: None   Collection Time: 05/30/23 12:00 AM   Specimen: Anterior Nasal Swab  Result Value Ref Range Status   SARS Coronavirus 2 by RT PCR NEGATIVE NEGATIVE Final   Influenza A by PCR NEGATIVE NEGATIVE Final   Influenza B by PCR NEGATIVE NEGATIVE Final    Comment: (NOTE) The Xpert Xpress SARS-CoV-2/FLU/RSV plus assay is intended as an aid in the diagnosis of influenza from Nasopharyngeal swab specimens and should not be used as a sole basis for treatment. Nasal washings and aspirates are unacceptable for Xpert Xpress SARS-CoV-2/FLU/RSV testing.  Fact Sheet for Patients: bloggercourse.com  Fact Sheet for Healthcare Providers: seriousbroker.it  This test is not yet approved or cleared by the [place]  FDA and has been authorized for detection and/or diagnosis of SARS-CoV-2 by FDA under an Emergency Use Authorization (EUA). This EUA will remain in effect (meaning this test can be used) for the duration of the COVID-19 declaration under Section 564(b)(1) of the Act, 21 U.S.C. section 360bbb-3(b)(1), unless the authorization is terminated or revoked.     Resp Syncytial Virus by PCR NEGATIVE NEGATIVE Final    Comment: (NOTE) Fact Sheet for Patients: bloggercourse.com  Fact Sheet for Healthcare Providers: seriousbroker.it  This test is not yet approved or cleared by the [place]  FDA and has been authorized for detection and/or diagnosis of SARS-CoV-2 by FDA under an Emergency Use Authorization (EUA). This EUA will remain in effect (meaning this test can be used) for the duration of the COVID-19 declaration under Section 564(b)(1) of the Act, 21 U.S.C. section 360bbb-3(b)(1), unless the authorization is terminated or revoked.  Performed at [facility], [address], [place], [place] [address]   Body fluid culture w Gram Stain     Status: None (Preliminary result)   Collection Time: 05/30/23  4:24 AM   Specimen: Peritoneal Fluid  Result Value Ref Range Status   Specimen Description FLUID PERITONEAL  Final   Special Requests NONE  Final   Gram Stain NO WBC SEEN NO ORGANISMS SEEN   Final   Culture   Final    NO GROWTH 1 DAY Performed at [facility],  [address] [place], [place] [address]    Report Status PENDING  Incomplete         Radiology Studies: ECHOCARDIOGRAM COMPLETE Result Date: 05/30/2023    ECHOCARDIOGRAM REPORT   Patient Name:   AMERA BANOS Date of Exam: 05/30/2023 Medical Rec #:  [id number]               Height:       65.0 in Accession #:    [id number]              Weight:       110.0 lb Date of Birth:  [birth year]               BSA:          1.534 m Patient Age:    32 years                BP:           146/101 mmHg Patient Gender: F                       HR:           93 bpm. Exam Location:  Inpatient Procedure: 2D Echo, Cardiac Doppler and Color Doppler Indications:    CHF  History:        Patient has no prior history of Echocardiogram examinations.                 Risk Factors:Hypertension and Diabetes.  Sonographer:    Lanell Maduro Referring Phys: [id number] JUSTIN B HOWERTER IMPRESSIONS  1. Left ventricular ejection fraction, by estimation, is 60 to 65%. The left ventricle has normal function. The left ventricle has no regional wall motion abnormalities. There is mild left ventricular hypertrophy of the septal segment. Left ventricular diastolic parameters were normal.  2. Right ventricular systolic function is normal. The right ventricular size is normal. There is severely elevated pulmonary artery systolic pressure.  3. Right atrial size was mildly dilated.  4. The mitral valve is normal in structure. No evidence of mitral valve regurgitation. No evidence of mitral stenosis.  5.  Tricuspid valve regurgitation is moderate.  6. The aortic valve is tricuspid. Aortic valve regurgitation is not visualized. No aortic stenosis is present.  7. The inferior vena cava is normal in size with greater than 50% respiratory variability, suggesting right atrial pressure of 3 mmHg. FINDINGS  Left Ventricle: Left ventricular ejection fraction, by estimation, is 60 to 65%. The left ventricle has normal function. The left ventricle has no regional wall motion abnormalities. The left ventricular internal cavity size was normal in size. There is  mild left ventricular hypertrophy of the septal segment. Left ventricular diastolic parameters were normal. Right Ventricle: The right ventricular size is normal. No increase in right ventricular wall thickness. Right ventricular systolic function is normal. There is severely elevated pulmonary artery systolic pressure. The tricuspid regurgitant velocity is 3.92 m/s, and with an assumed right atrial pressure of 3 mmHg, the estimated right ventricular systolic pressure is 64.5 mmHg. Left Atrium: Left atrial size was normal in size. Right Atrium: Right atrial size was mildly dilated. Pericardium: Trivial pericardial effusion is present. Mitral Valve: The mitral valve is normal in structure. No evidence of mitral valve regurgitation. No evidence of mitral valve stenosis. Tricuspid Valve: The tricuspid valve is normal in structure. Tricuspid valve regurgitation is moderate . No evidence of tricuspid stenosis. Aortic Valve: The aortic valve is tricuspid.  Aortic valve regurgitation is not visualized. No aortic stenosis is present. Pulmonic Valve: The pulmonic valve was normal in structure. Pulmonic valve regurgitation is not visualized. No evidence of pulmonic stenosis. Aorta: The aortic root is normal in size and structure. Venous: The inferior vena cava is normal in size with greater than 50% respiratory variability, suggesting right atrial pressure of 3 mmHg. IAS/Shunts: No  atrial level shunt detected by color flow Doppler.  LEFT VENTRICLE PLAX 2D LVIDd:         4.10 cm     Diastology LVIDs:         2.90 cm     LV e' lateral:   9.90 cm/s LV PW:         0.90 cm     LV E/e' lateral: 6.7 LV IVS:        1.10 cm LVOT diam:     2.10 cm LV SV:         45 LV SV Index:   29 LVOT Area:     3.46 cm  LV Volumes (MOD) LV vol d, MOD A2C: 56.4 ml LV vol d, MOD A4C: 53.1 ml LV vol s, MOD A2C: 24.3 ml LV vol s, MOD A4C: 18.1 ml LV SV MOD A2C:     32.1 ml LV SV MOD A4C:     53.1 ml LV SV MOD BP:      33.7 ml RIGHT VENTRICLE RV Basal diam:  3.60 cm RV S prime:     11.10 cm/s TAPSE (M-mode): 2.0 cm LEFT ATRIUM           Index        RIGHT ATRIUM           Index LA diam:      2.70 cm 1.76 cm/m   RA Area:     18.50 cm LA Vol (A2C): 22.2 ml 14.47 ml/m  RA Volume:   49.30 ml  32.13 ml/m LA Vol (A4C): 15.0 ml 9.78 ml/m  AORTIC VALVE LVOT Vmax:   78.30 cm/s LVOT Vmean:  50.100 cm/s LVOT VTI:    0.130 m  AORTA Ao Root diam: 2.60 cm Ao Asc diam:  2.90 cm MITRAL VALVE               TRICUSPID VALVE MV Area (PHT): 6.11 cm    TR Peak grad:   61.5 mmHg MV E velocity: 66.00 cm/s  TR Vmax:        392.00 cm/s MV A velocity: 55.80 cm/s MV E/A ratio:  1.18        SHUNTS                            Systemic VTI:  0.13 m                            Systemic Diam: 2.10 cm Annabella Scarce MD Electronically signed by Annabella Scarce MD Signature Date/Time: 05/30/2023/5:12:40 PM    Final    CT ABDOMEN PELVIS W CONTRAST Result Date: 05/30/2023 CLINICAL DATA:  Abdominal pain and swelling and swelling in the feet. History of end-stage renal failure and diabetes. EXAM: CT ABDOMEN AND PELVIS WITH CONTRAST TECHNIQUE: Multidetector CT imaging of the abdomen and pelvis was performed using the standard protocol following bolus administration of intravenous contrast. RADIATION DOSE REDUCTION: This exam was performed according to the departmental dose-optimization program which includes automated exposure  control, adjustment of  the mA and/or kV according to patient size and/or use of iterative reconstruction technique. CONTRAST:  65mL OMNIPAQUE  IOHEXOL  350 MG/ML SOLN COMPARISON:  CT without contrast 04/19/2021. FINDINGS: Lower chest: There are small right and minimal left layering pleural effusions, with atelectatic changes in both bases. There is cardiomegaly with a right chamber predominance, likely indicating chronic right heart dysfunction and also evidenced by IVC and hepatic vein contrast reflux. There are coronary artery calcifications, age-advanced. Hepatobiliary: Loss of fine detail owing to massive ascites, body wall anasarca and artifact from overlying wires and patient's arms in the field. The liver is mildly steatotic. No obvious mass is seen and no calcified gallstones or gallbladder thickening. No biliary dilatation. Pancreas: Atrophic and otherwise unremarkable. 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 No mass enhancement. No splenomegaly. Adrenals/Urinary Tract: There is bilateral renal cortical thinning. No adrenal mass or renal mass enhancement. There is air in both collecting systems which could be due to recent instrumentation or infectious process. Air in the bladder is also noted. Bladder wall is nondistended. There is no evidence of vesicoenteric fistula. Stomach/Bowel: There is mucosal enhancement of the stomach and proximal small bowel consistent with gastroenteritis. No dilatation or wall thickening in the stomach and small bowel. The appendix is normal. There is either wall thickening or nondistention in the ascending, transverse and descending colon. Scattered sigmoid diverticula. Correlate clinically for underlying colitis. Unable to tell if there is adjacent inflammatory change due to massive ascites. Vascular/Lymphatic: Age-advanced aortic and branch vessel atherosclerosis. No AAA. No adenopathy is seen through the ascites. Reproductive: The uterus is intact. The ovaries are not  enlarged. There are bilateral BTL clips, seen previously. Other: massive ascites and diffuse body wall anasarca. No free air. No free air hemorrhage or localizing collections. The ascites displaces the bowel inward and extends up around the liver and spleen. There is no incarcerated hernia. Musculoskeletal: No acute or significant osseous findings. IMPRESSION: 1. Massive ascites and diffuse body wall anasarca. 2. Small right and minimal left pleural effusions. Basilar atelectasis. 3. Cardiomegaly with right chamber predominance, likely indicating chronic right heart dysfunction. 4. Age-advanced atherosclerosis. 5. Air in the renal collecting systems and bladder which could be due to recent instrumentation or infectious process. 6. Mucosal enhancement in the stomach and proximal small bowel consistent with gastroenteritis. 7. Wall thickening versus nondistention in the ascending, transverse and descending colon. Correlate clinically for underlying colitis. 8. Diverticulosis. 9. Aortic atherosclerosis. Aortic Atherosclerosis (ICD10-I70.0). Electronically Signed   By: Francis Quam M.D.   On: 05/30/2023 02:25   DG Chest Portable 1 View Result Date: 05/30/2023 CLINICAL DATA:  Abdominal pain with extremity swelling. Check volume status. EXAM: PORTABLE CHEST 1 VIEW COMPARISON:  Portable chest 02/02/2023 FINDINGS: There is a small right pleural effusion which was seen previously, with overlying atelectasis. Low inspiration on exam. There is increased left perihilar linear atelectasis. The hypoinflated lungs otherwise appear clear. There is no substantial left effusion. The heart silhouette is enlarged but probably exaggerated in part due to low inspiration. There is mild central vascular prominence which could be due to low lung volumes or mild perihilar vascular congestion. No edema is seen. The mediastinum is normally outlined.  The thoracic cage is intact. IMPRESSION: 1. Low inspiration on exam. 2. Small right  pleural effusion with overlying atelectasis. 3. Increased left perihilar linear atelectasis. No visible infiltrate. 4. Cardiomegaly. 5. Mild central vascular prominence which could be due to low lung volumes or mild  perihilar vascular congestion. No edema is seen. Electronically Signed   By: Francis Quam M.D.   On: 05/30/2023 00:41    Scheduled Meds:  Chlorhexidine  Gluconate Cloth  6 each Topical Q0600   Chlorhexidine  Gluconate Cloth  6 each Topical Q0600   influenza vaccine adjuvanted  0.5 mL Intramuscular Tomorrow-1000   insulin  aspart  0-4 Units Subcutaneous Q6H   labetalol   100 mg Oral BID   rosuvastatin   10 mg Oral Daily   sodium zirconium cyclosilicate   10 g Oral Once   Continuous Infusions:  anticoagulant sodium citrate      cefTRIAXone  (ROCEPHIN )  IV 2 g (05/31/23 1002)   dextrose  40 mL/hr at 05/31/23 0700   metronidazole  500 mg (05/31/23 0957)     LOS: 1 day    Time spent: 40 minutes  Almarie KANDICE Hoots, MD 05/31/2023, 2:17 PM

## 2023-05-31 NOTE — Progress Notes (Signed)
 Pt having hypoglycemia issues requiring d50 push, minimal insulin  via endotool.  Lynwood Kipper, NP notified of events.  Pt more alert now and appropriately oriented, but still lethargic.  Reached out to lab to inquire about 0100 BMP, but not able to reach blood draw team.  BMP order adjusted to STAT.

## 2023-05-31 NOTE — Progress Notes (Signed)
 [facility] Progress Note   Subjective:   Patient's blood sugar came down quickly yesterday, had some low BS overnight and now on dextrose , off of the insulin  drip. She is much more alert today, very nauseated. Denies SOB, CP, dizziness. Asked about attending HD only two days per week lately and she reports she was having transportation issues.   Objective Vitals:   05/30/23 2313 05/31/23 0300 05/31/23 0500 05/31/23 0753  BP: (!) 137/100 (!) 149/100    Pulse: 100 98    Resp: 17 15    Temp: 98.1 F (36.7 C) 98 F (36.7 C)  98.1 F (36.7 C)  TempSrc: Axillary Oral    SpO2: 97% 95%    Weight:   55.4 kg   Height:       Physical Exam General: Alert, uncomfortable appearing female Heart: RRR, no murmurs, rubs or gallops Lungs: CTA bilaterally, respirations unlabored on RA Abdomen: distended, +BS, palpation deferred d/t pain Extremities: 1+ pitting edema b/l lower extremities Dialysis Access: LUE AVG + t/b  Additional Objective Labs: Basic Metabolic Panel: Recent Labs  Lab 05/30/23 0810 05/30/23 1300 05/30/23 1616 05/31/23 0657  NA 127* 129* 130* 129*  K 3.7 3.4* 3.3* 3.7  CL 90* 93*  --  89*  CO2 19* 18*  --  24  GLUCOSE 592* 272*  --  95  BUN 57* 56*  --  59*  CREATININE 6.51* 6.13*  --  6.61*  CALCIUM  8.6* 8.1*  --  8.1*  PHOS 4.9*  --   --   --    Liver Function Tests: Recent Labs  Lab 05/30/23 0200  AST 15  ALT 10  ALKPHOS 134*  BILITOT 1.4*  PROT 6.2*  ALBUMIN  2.6*   Recent Labs  Lab 05/30/23 0200  LIPASE 22   CBC: Recent Labs  Lab 05/30/23 0114 05/30/23 0128 05/30/23 0129 05/30/23 1616  WBC 7.1  --   --   --   NEUTROABS 5.5  --   --   --   HGB 10.2* 12.9 12.9 13.6  HCT 32.0* 38.0 38.0 40.0  MCV 88.2  --   --   --   PLT 106*  --   --   --    Blood Culture    Component Value Date/Time   SDES FLUID PERITONEAL 05/30/2023 0424   SPECREQUEST NONE 05/30/2023 0424   CULT  05/30/2023 0424    NO GROWTH 1 DAY Performed at [facility], [address] [place], [place] [address]    REPTSTATUS PENDING 05/30/2023 0424    Cardiac Enzymes: No results for input(s): CKTOTAL, CKMB, CKMBINDEX, TROPONINI in the last 168 hours. CBG: Recent Labs  Lab 05/31/23 0536 05/31/23 0646 05/31/23 0750 05/31/23 0823 05/31/23 1021  GLUCAP 76 72 62* 120* 86   Iron Studies: No results for input(s): IRON, TIBC, TRANSFERRIN, FERRITIN in the last 72 hours. @lablastinr3 @ Studies/Results: ECHOCARDIOGRAM COMPLETE Result Date: 05/30/2023    ECHOCARDIOGRAM REPORT   Patient Name:   SHEILLA MARIS Date of Exam: 05/30/2023 Medical Rec #:  [id number]               Height:       65.0 in Accession #:    [id number]              Weight:       110.0 lb Date of Birth:  09/28/1990               BSA:  1.534 m Patient Age:    32 years                BP:           146/101 mmHg Patient Gender: F                       HR:           93 bpm. Exam Location:  Inpatient Procedure: 2D Echo, Cardiac Doppler and Color Doppler Indications:    CHF  History:        Patient has no prior history of Echocardiogram examinations.                 Risk Factors:Hypertension and Diabetes.  Sonographer:    Lanell Maduro Referring Phys: [id number] JUSTIN B HOWERTER IMPRESSIONS  1. Left ventricular ejection fraction, by estimation, is 60 to 65%. The left ventricle has normal function. The left ventricle has no regional wall motion abnormalities. There is mild left ventricular hypertrophy of the septal segment. Left ventricular diastolic parameters were normal.  2. Right ventricular systolic function is normal. The right ventricular size is normal. There is severely elevated pulmonary artery systolic pressure.  3. Right atrial size was mildly dilated.  4. The mitral valve is normal in structure. No evidence of mitral valve regurgitation. No evidence of mitral stenosis.  5. Tricuspid valve regurgitation is moderate.  6. The aortic valve is tricuspid.  Aortic valve regurgitation is not visualized. No aortic stenosis is present.  7. The inferior vena cava is normal in size with greater than 50% respiratory variability, suggesting right atrial pressure of 3 mmHg. FINDINGS  Left Ventricle: Left ventricular ejection fraction, by estimation, is 60 to 65%. The left ventricle has normal function. The left ventricle has no regional wall motion abnormalities. The left ventricular internal cavity size was normal in size. There is  mild left ventricular hypertrophy of the septal segment. Left ventricular diastolic parameters were normal. Right Ventricle: The right ventricular size is normal. No increase in right ventricular wall thickness. Right ventricular systolic function is normal. There is severely elevated pulmonary artery systolic pressure. The tricuspid regurgitant velocity is 3.92 m/s, and with an assumed right atrial pressure of 3 mmHg, the estimated right ventricular systolic pressure is 64.5 mmHg. Left Atrium: Left atrial size was normal in size. Right Atrium: Right atrial size was mildly dilated. Pericardium: Trivial pericardial effusion is present. Mitral Valve: The mitral valve is normal in structure. No evidence of mitral valve regurgitation. No evidence of mitral valve stenosis. Tricuspid Valve: The tricuspid valve is normal in structure. Tricuspid valve regurgitation is moderate . No evidence of tricuspid stenosis. Aortic Valve: The aortic valve is tricuspid. Aortic valve regurgitation is not visualized. No aortic stenosis is present. Pulmonic Valve: The pulmonic valve was normal in structure. Pulmonic valve regurgitation is not visualized. No evidence of pulmonic stenosis. Aorta: The aortic root is normal in size and structure. Venous: The inferior vena cava is normal in size with greater than 50% respiratory variability, suggesting right atrial pressure of 3 mmHg. IAS/Shunts: No atrial level shunt detected by color flow Doppler.  LEFT VENTRICLE PLAX 2D  LVIDd:         4.10 cm     Diastology LVIDs:         2.90 cm     LV e' lateral:   9.90 cm/s LV PW:         0.90 cm  LV E/e' lateral: 6.7 LV IVS:        1.10 cm LVOT diam:     2.10 cm LV SV:         45 LV SV Index:   29 LVOT Area:     3.46 cm  LV Volumes (MOD) LV vol d, MOD A2C: 56.4 ml LV vol d, MOD A4C: 53.1 ml LV vol s, MOD A2C: 24.3 ml LV vol s, MOD A4C: 18.1 ml LV SV MOD A2C:     32.1 ml LV SV MOD A4C:     53.1 ml LV SV MOD BP:      33.7 ml RIGHT VENTRICLE RV Basal diam:  3.60 cm RV S prime:     11.10 cm/s TAPSE (M-mode): 2.0 cm LEFT ATRIUM           Index        RIGHT ATRIUM           Index LA diam:      2.70 cm 1.76 cm/m   RA Area:     18.50 cm LA Vol (A2C): 22.2 ml 14.47 ml/m  RA Volume:   49.30 ml  32.13 ml/m LA Vol (A4C): 15.0 ml 9.78 ml/m  AORTIC VALVE LVOT Vmax:   78.30 cm/s LVOT Vmean:  50.100 cm/s LVOT VTI:    0.130 m  AORTA Ao Root diam: 2.60 cm Ao Asc diam:  2.90 cm MITRAL VALVE               TRICUSPID VALVE MV Area (PHT): 6.11 cm    TR Peak grad:   61.5 mmHg MV E velocity: 66.00 cm/s  TR Vmax:        392.00 cm/s MV A velocity: 55.80 cm/s MV E/A ratio:  1.18        SHUNTS                            Systemic VTI:  0.13 m                            Systemic Diam: 2.10 cm Annabella Scarce MD Electronically signed by Annabella Scarce MD Signature Date/Time: 05/30/2023/5:12:40 PM    Final    CT ABDOMEN PELVIS W CONTRAST Result Date: 05/30/2023 CLINICAL DATA:  Abdominal pain and swelling and swelling in the feet. History of end-stage renal failure and diabetes. EXAM: CT ABDOMEN AND PELVIS WITH CONTRAST TECHNIQUE: Multidetector CT imaging of the abdomen and pelvis was performed using the standard protocol following bolus administration of intravenous contrast. RADIATION DOSE REDUCTION: This exam was performed according to the departmental dose-optimization program which includes automated exposure control, adjustment of the mA and/or kV according to patient size and/or use of iterative  reconstruction technique. CONTRAST:  65mL OMNIPAQUE  IOHEXOL  350 MG/ML SOLN COMPARISON:  CT without contrast 04/19/2021. FINDINGS: Lower chest: There are small right and minimal left layering pleural effusions, with atelectatic changes in both bases. There is cardiomegaly with a right chamber predominance, likely indicating chronic right heart dysfunction and also evidenced by IVC and hepatic vein contrast reflux. There are coronary artery calcifications, age-advanced. Hepatobiliary: Loss of fine detail owing to massive ascites, body wall anasarca and artifact from overlying wires and patient's arms in the field. The liver is mildly steatotic. No obvious mass is seen and no calcified gallstones or gallbladder thickening. No biliary dilatation. Pancreas: Atrophic and otherwise unremarkable. 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 No mass enhancement. No splenomegaly. Adrenals/Urinary Tract: There is bilateral renal cortical thinning. No adrenal mass or renal mass enhancement. There is air in both collecting systems which could be due to recent instrumentation or infectious process. Air in the bladder is also noted. Bladder wall is nondistended. There is no evidence of vesicoenteric fistula. Stomach/Bowel: There is mucosal enhancement of the stomach and proximal small bowel consistent with gastroenteritis. No dilatation or wall thickening in the stomach and small bowel. The appendix is normal. There is either wall thickening or nondistention in the ascending, transverse and descending colon. Scattered sigmoid diverticula. Correlate clinically for underlying colitis. Unable to tell if there is adjacent inflammatory change due to massive ascites. Vascular/Lymphatic: Age-advanced aortic and branch vessel atherosclerosis. No AAA. No adenopathy is seen through the ascites. Reproductive: The uterus is intact. The ovaries are not enlarged. There are bilateral BTL clips, seen previously. Other: massive  ascites and diffuse body wall anasarca. No free air. No free air hemorrhage or localizing collections. The ascites displaces the bowel inward and extends up around the liver and spleen. There is no incarcerated hernia. Musculoskeletal: No acute or significant osseous findings. IMPRESSION: 1. Massive ascites and diffuse body wall anasarca. 2. Small right and minimal left pleural effusions. Basilar atelectasis. 3. Cardiomegaly with right chamber predominance, likely indicating chronic right heart dysfunction. 4. Age-advanced atherosclerosis. 5. Air in the renal collecting systems and bladder which could be due to recent instrumentation or infectious process. 6. Mucosal enhancement in the stomach and proximal small bowel consistent with gastroenteritis. 7. Wall thickening versus nondistention in the ascending, transverse and descending colon. Correlate clinically for underlying colitis. 8. Diverticulosis. 9. Aortic atherosclerosis. Aortic Atherosclerosis (ICD10-I70.0). Electronically Signed   By: Francis Quam M.D.   On: 05/30/2023 02:25   DG Chest Portable 1 View Result Date: 05/30/2023 CLINICAL DATA:  Abdominal pain with extremity swelling. Check volume status. EXAM: PORTABLE CHEST 1 VIEW COMPARISON:  Portable chest 02/02/2023 FINDINGS: There is a small right pleural effusion which was seen previously, with overlying atelectasis. Low inspiration on exam. There is increased left perihilar linear atelectasis. The hypoinflated lungs otherwise appear clear. There is no substantial left effusion. The heart silhouette is enlarged but probably exaggerated in part due to low inspiration. There is mild central vascular prominence which could be due to low lung volumes or mild perihilar vascular congestion. No edema is seen. The mediastinum is normally outlined.  The thoracic cage is intact. IMPRESSION: 1. Low inspiration on exam. 2. Small right pleural effusion with overlying atelectasis. 3. Increased left perihilar  linear atelectasis. No visible infiltrate. 4. Cardiomegaly. 5. Mild central vascular prominence which could be due to low lung volumes or mild perihilar vascular congestion. No edema is seen. Electronically Signed   By: Francis Quam M.D.   On: 05/30/2023 00:41   Medications:  cefTRIAXone  (ROCEPHIN )  IV 2 g (05/31/23 1002)   dextrose  40 mL/hr at 05/31/23 0700   metronidazole  500 mg (05/31/23 0957)    Chlorhexidine  Gluconate Cloth  6 each Topical Q0600   influenza vaccine adjuvanted  0.5 mL Intramuscular Tomorrow-1000   insulin  aspart  0-4 Units Subcutaneous Q6H   labetalol   100 mg Oral BID   rosuvastatin   10 mg Oral Daily   sodium zirconium cyclosilicate   10 g Oral Once    Dialysis Orders: Center: [facility]  on MWF. 180NRe4 hours BFR 400 DFR Aut o1.5 EDW 46.5kg 2K 3Ca AVG 16g Heparin  2500 unit bolus Mircera  50mcg IV q 2 weeks Calcitriol 0.25mcg PO q HD Korsuva 0.5ml q HD    Assessment/Plan:  Hyperglycemia: Very poorly controlled T1DM with recent insulin  non-compliance. BS >900 in the ED, started on insulin  drip, then with some low BS overnight, now on dextrose . Much more alert today.   ESRD:  HD schedule is MWF but she typically only attends dialysis two times per week for 3/4 prescribed hours-reports recent transportation issues. She is volume overloaded but this is chronic, not in any respiratory distress and K+ is controlled. HD today then resume regular schedule tomorrow  Hypertension/volume: ++ ascites and edema on exam due to chronic noncompliance with HD and fluid restrictions.UF with HD as tolerated  Anemia: Hgb in the 12's, no ESA indicated at this time.   Metabolic bone disease: Calcium  and phos controlled, continue calcitriol and phos binders once tolerating PO  Nutrition:  Currently NPO  Lucie Collet, PA-C 05/31/2023, 10:49 AM  [facility] Pager: [phone]

## 2023-05-31 NOTE — Progress Notes (Signed)
Pt receives out-pt HD at [facility] on MWF. Will assist as needed.   Tracy Mounce Renal Navigator [phone] 

## 2023-05-31 NOTE — Inpatient Diabetes Management (Addendum)
 Inpatient Diabetes Program Recommendations  AACE/ADA: New Consensus Statement on Inpatient Glycemic Control  Target Ranges:  Prepandial:   less than 140 mg/dL      Peak postprandial:   less than 180 mg/dL (1-2 hours)      Critically ill patients:  140 - 180 mg/dL    Latest Reference Range & Units 05/31/23 00:00 05/31/23 01:10 05/31/23 02:33 05/31/23 03:25 05/31/23 05:00 05/31/23 05:36 05/31/23 06:46  Glucose-Capillary 70 - 99 mg/dL 91 76 60 (L) 881 (H) 78 76 72    Latest Reference Range & Units 05/30/23 15:52 05/30/23 17:30 05/30/23 18:33 05/30/23 19:02 05/30/23 20:02 05/30/23 21:11 05/30/23 22:05 05/30/23 22:06 05/30/23 23:10  Glucose-Capillary 70 - 99 mg/dL 855 (H) 75 63 (L) 895 (H) 74 71 54 (L) 63 (L) 109 (H)    Latest Reference Range & Units 05/30/23 01:29 05/30/23 02:00  CO2 22 - 32 mmol/L  19 (L)  Glucose 70 - 99 mg/dL >299 (HH) 090 (HH)  Anion gap 5 - 15   16 (H)    Latest Reference Range & Units 05/30/23 01:14  Beta-Hydroxybutyric Acid 0.05 - 0.27 mmol/L 1.06 (H)    Latest Reference Range & Units 02/08/22 21:41 05/30/23 01:12  Hemoglobin A1C 4.8 - 5.6 % 14.3 (H) >15.5 (H)   Review of Glycemic Control  Diabetes history: DM1 (does NOT make any insulin ; requires basal, correction, and carb coverage insulin ) Outpatient Diabetes medications: Lantus  3 units daily, Humalog  0-6 units TID with meals Current orders for Inpatient glycemic control: IV insulin   Inpatient Diabetes Program Recommendations:    Insulin : Given multiple episodes of hypoglycemia on IV insulin , do not recommend to order basal insulin  at time of transition.  Once provider is ready to transition from IV to SQ insulin , please consider ordering custom scale Novolog  0-4 units Q6H CBG 70-200    0 units CBG 201-300  1 unit CBG 301-400   2 units CBG 401-500   3 units CBG >501        4 units  HbgA1C: A1C >15.5% on 05/30/23 indicating an average glucose over 398 mg/dl over the past 2-3 months.  NOTE: Patient has  Type 1 DM with ESRD and very sensitive to insulin . Patient sees [facility] and was last seen by J. Hyacinth, NP on 06/29/22. Per office note on 06/29/22 patient was prescribed Tresiba or Lantus  3 units daily, Humalog  or Fiasp  1 unit pre 15 grams of carbs plus correction if CBG over 200 mg/dl (799-749 give 1 unit, 748-699 give 2 units, >301 give 3 units).  Patient admitted with hyperglycemia (lab glucose 909 mg/dl at 7:99 am on 87/69/75) with reports of N/V for 1 day and patient did not take any insulin  within previous 24 hours prior to admission.  Patient initially ordered IV insulin  and has experienced several episodes of hypoglycemia over the past 12 hours despite getting D10 @40  ml/hr and getting D50 IV pushes for treatment of hypoglycemia.  Addendum 05/31/23@10 :45-Spoke with patient at bedside regarding DM control. Patient kept eyes closed during most of conversation but answered questions appropriately. Patient states that she had taken insulin  about 4 hours prior to coming to the hospital. Patient reports that her sons help her with checking glucose and taking insulin . Patient states she is taking insulin  consistently. Inquired about possible reasons that glucose was 909 mg/dl on labs shortly after arrival to hospital. Patient states she does not know why or how glucose got that high. Discussed importance of taking insulin  even when  she is not able to eat to prevent DKA but advised to follow up with Endocrinology to discuss if she needs to adjust doses of insulin  when she is not able to eat or drink due to N/V. Patient reports that she has all needed DM medications and supplies. Discussed A1C of over 15.5% on 05/30/23 which indicates glucose has been over 398 mg/dl over the past 2-3 months. Reviewed glucose and A1C goals. Reviewed complications from uncontrolled DM and stressed importance of getting DM under better control. Asked patient to be sure to follow up with Endocrinology as  soon as possible. Discussed that Novolog  custom scale has been ordered Q6H and no long acting insulin  will be ordered at this time due to recurrent hypoglycemia. Patient states she still has not ate or drank any carbohydrates today. Encouraged patient to try to eat when diet tray comes. Patient verbalized understanding of information and has no questions at this time.   Thanks, Earnie Gainer, RN, MSN, CDCES Diabetes Coordinator Inpatient Diabetes Program [phone] (Team Pager from 8am to 5pm)

## 2023-05-31 NOTE — Progress Notes (Signed)
Pt having low CBGs despite the insulin gtt stopped for several hours and d10 @30mL /hr running.  Pt also very lethargic, responding only to painful stimuli, not answering/following commands, but still protecting airway with VSS.  Also inquired about BMPs.  Chinita Greenland, NP on call notified of events.  D10 increased to 40/hr and BMP scheduled.

## 2023-05-31 NOTE — Progress Notes (Signed)
 Report called to dialysis RN. Patient will have dialysis this evening. A&O x 4. Most recent capillary blood glucose 126.

## 2023-06-01 DIAGNOSIS — R739 Hyperglycemia, unspecified: Secondary | ICD-10-CM | POA: Diagnosis not present

## 2023-06-01 LAB — GLUCOSE, CAPILLARY
Glucose-Capillary: 121 mg/dL — ABNORMAL HIGH (ref 70–99)
Glucose-Capillary: 141 mg/dL — ABNORMAL HIGH (ref 70–99)
Glucose-Capillary: 164 mg/dL — ABNORMAL HIGH (ref 70–99)
Glucose-Capillary: 171 mg/dL — ABNORMAL HIGH (ref 70–99)

## 2023-06-01 LAB — HEPATITIS B SURFACE ANTIBODY, QUANTITATIVE: Hep B S AB Quant (Post): 139 m[IU]/mL

## 2023-06-01 NOTE — Progress Notes (Signed)
 Pt refusing labetalol, crestor, and subcutaneous heparin. Pt also stated she would not go to HD today, but per nephrology note they are aware. Attempt made to education pt without success. Dr Jarvis Newcomer notified.

## 2023-06-01 NOTE — Progress Notes (Signed)
   05/31/23 2352  Vitals  BP 92/72  MAP (mmHg) 80  BP Location Right Arm  BP Method Automatic  Patient Position (if appropriate) Lying  Pulse Rate (!) 102  Pulse Rate Source Monitor  ECG Heart Rate (!) 102  Resp 17  Oxygen Therapy  SpO2 100 %  O2 Device Room Air  During Treatment Monitoring  Blood Flow Rate (mL/min) 0 mL/min  Arterial Pressure (mmHg) -130.9 mmHg  Venous Pressure (mmHg) 30.3 mmHg  TMP (mmHg) 2.83 mmHg  Ultrafiltration Rate (mL/min) 803 mL/min  Dialysate Flow Rate (mL/min) 300 ml/min  Dialysate Potassium Concentration 3  Dialysate Calcium  Concentration 2.5  Duration of HD Treatment -hour(s) 3.5 hour(s)  Cumulative Fluid Removed (mL) per Treatment  2500.18  Intra-Hemodialysis Comments Tx completed  Post Treatment  Dialyzer Clearance Lightly streaked  Liters Processed 84  Fluid Removed (mL) 2500 mL  Tolerated HD Treatment Yes  Post-Hemodialysis Comments pt stable  AVG/AVF Arterial Site Held (minutes) 7 minutes  AVG/AVF Venous Site Held (minutes) 7 minutes  Note  Patient Observations pt stable  Fistula / Graft Left Upper arm Arteriovenous vein graft  Placement Date/Time: 06/19/21 0850   Placed prior to admission: No  Orientation: Left  Access Location: Upper arm  Access Type: Arteriovenous vein graft  Expiration Date: 02/15/26  Site Condition No complications  Fistula / Graft Assessment Thrill;Bruit  Status Deaccessed  Needle Size 15  Drainage Description None   Pt stable post treatment. Goal lowered due to low blood pressure,net UF .

## 2023-06-01 NOTE — Progress Notes (Signed)
  [facility] Progress Note   Subjective:   Patient had HD overnight, UF 2.5L, limiting by BP. Patient reports she still feels awful, ongoing nausea. Denies SOB, CP, dizziness, palpitations. On RA. Prefers to wait until tomorrow for next HD since HD was late last night.   Objective Vitals:   05/31/23 2352 06/01/23 0042 06/01/23 0300 06/01/23 0824  BP: 92/72 121/78 (!) 144/89 (!) 141/88  Pulse: (!) 102 80 90 88  Resp: 17 15 13 11   Temp:  98.3 F (36.8 C) 98.4 F (36.9 C) 98.3 F (36.8 C)  TempSrc:  Oral Oral Oral  SpO2: 100% 99% 98% 95%  Weight:      Height:       Physical Exam General: Chronically ill appearing female, alert and in NAD Heart: RRR, no murmurs, rubs or gallops Lungs: CTA anteriorly, respirations unlabored on RA Abdomen: Soft, +distention, +BS Extremities: 1+ pitting edema b/l lower extremities Dialysis Access: LUE AVG + t/b  Additional Objective Labs: Basic Metabolic Panel: Recent Labs  Lab 05/30/23 0810 05/30/23 1300 05/30/23 1616 05/31/23 0657  NA 127* 129* 130* 129*  K 3.7 3.4* 3.3* 3.7  CL 90* 93*  --  89*  CO2 19* 18*  --  24  GLUCOSE 592* 272*  --  95  BUN 57* 56*  --  59*  CREATININE 6.51* 6.13*  --  6.61*  CALCIUM  8.6* 8.1*  --  8.1*  PHOS 4.9*  --   --   --    Liver Function Tests: Recent Labs  Lab 05/30/23 0200  AST 15  ALT 10  ALKPHOS 134*  BILITOT 1.4*  PROT 6.2*  ALBUMIN  2.6*   Recent Labs  Lab 05/30/23 0200  LIPASE 22   CBC: Recent Labs  Lab 05/30/23 0114 05/30/23 0128 05/30/23 0129 05/30/23 1616  WBC 7.1  --   --   --   NEUTROABS 5.5  --   --   --   HGB 10.2* 12.9 12.9 13.6  HCT 32.0* 38.0 38.0 40.0  MCV 88.2  --   --   --   PLT 106*  --   --   --    Blood Culture    Component Value Date/Time   SDES FLUID PERITONEAL 05/30/2023 0424   SPECREQUEST NONE 05/30/2023 0424   CULT  05/30/2023 0424    NO GROWTH 1 DAY Performed at [facility], [address] [place], [place] [address]     REPTSTATUS PENDING 05/30/2023 0424    Cardiac Enzymes: No results for input(s): CKTOTAL, CKMB, CKMBINDEX, TROPONINI in the last 168 hours. CBG: Recent Labs  Lab 05/31/23 0823 05/31/23 1021 05/31/23 1722 06/01/23 0045 06/01/23 0540  GLUCAP 120* 86 126* 121* 171*   Iron Studies: No results for input(s): IRON, TIBC, TRANSFERRIN, FERRITIN in the last 72 hours. @lablastinr3 @ Studies/Results: ECHOCARDIOGRAM COMPLETE Result Date: 05/30/2023    ECHOCARDIOGRAM REPORT   Patient Name:   Ann Robertson Date of Exam: 05/30/2023 Medical Rec #:  [id number]               Height:       65.0 in Accession #:    [id number]              Weight:       110.0 lb Date of Birth:  [birth year]               BSA:          1.534 m Patient Age:  32 years                BP:           146/101 mmHg Patient Gender: F                       HR:           93 bpm. Exam Location:  Inpatient Procedure: 2D Echo, Cardiac Doppler and Color Doppler Indications:    CHF  History:        Patient has no prior history of Echocardiogram examinations.                 Risk Factors:Hypertension and Diabetes.  Sonographer:    Lanell Maduro Referring Phys: [id number] JUSTIN B HOWERTER IMPRESSIONS  1. Left ventricular ejection fraction, by estimation, is 60 to 65%. The left ventricle has normal function. The left ventricle has no regional wall motion abnormalities. There is mild left ventricular hypertrophy of the septal segment. Left ventricular diastolic parameters were normal.  2. Right ventricular systolic function is normal. The right ventricular size is normal. There is severely elevated pulmonary artery systolic pressure.  3. Right atrial size was mildly dilated.  4. The mitral valve is normal in structure. No evidence of mitral valve regurgitation. No evidence of mitral stenosis.  5. Tricuspid valve regurgitation is moderate.  6. The aortic valve is tricuspid. Aortic valve regurgitation is not visualized. No aortic  stenosis is present.  7. The inferior vena cava is normal in size with greater than 50% respiratory variability, suggesting right atrial pressure of 3 mmHg. FINDINGS  Left Ventricle: Left ventricular ejection fraction, by estimation, is 60 to 65%. The left ventricle has normal function. The left ventricle has no regional wall motion abnormalities. The left ventricular internal cavity size was normal in size. There is  mild left ventricular hypertrophy of the septal segment. Left ventricular diastolic parameters were normal. Right Ventricle: The right ventricular size is normal. No increase in right ventricular wall thickness. Right ventricular systolic function is normal. There is severely elevated pulmonary artery systolic pressure. The tricuspid regurgitant velocity is 3.92 m/s, and with an assumed right atrial pressure of 3 mmHg, the estimated right ventricular systolic pressure is 64.5 mmHg. Left Atrium: Left atrial size was normal in size. Right Atrium: Right atrial size was mildly dilated. Pericardium: Trivial pericardial effusion is present. Mitral Valve: The mitral valve is normal in structure. No evidence of mitral valve regurgitation. No evidence of mitral valve stenosis. Tricuspid Valve: The tricuspid valve is normal in structure. Tricuspid valve regurgitation is moderate . No evidence of tricuspid stenosis. Aortic Valve: The aortic valve is tricuspid. Aortic valve regurgitation is not visualized. No aortic stenosis is present. Pulmonic Valve: The pulmonic valve was normal in structure. Pulmonic valve regurgitation is not visualized. No evidence of pulmonic stenosis. Aorta: The aortic root is normal in size and structure. Venous: The inferior vena cava is normal in size with greater than 50% respiratory variability, suggesting right atrial pressure of 3 mmHg. IAS/Shunts: No atrial level shunt detected by color flow Doppler.  LEFT VENTRICLE PLAX 2D LVIDd:         4.10 cm     Diastology LVIDs:         2.90  cm     LV e' lateral:   9.90 cm/s LV PW:         0.90 cm     LV E/e' lateral: 6.7 LV IVS:  1.10 cm LVOT diam:     2.10 cm LV SV:         45 LV SV Index:   29 LVOT Area:     3.46 cm  LV Volumes (MOD) LV vol d, MOD A2C: 56.4 ml LV vol d, MOD A4C: 53.1 ml LV vol s, MOD A2C: 24.3 ml LV vol s, MOD A4C: 18.1 ml LV SV MOD A2C:     32.1 ml LV SV MOD A4C:     53.1 ml LV SV MOD BP:      33.7 ml RIGHT VENTRICLE RV Basal diam:  3.60 cm RV S prime:     11.10 cm/s TAPSE (M-mode): 2.0 cm LEFT ATRIUM           Index        RIGHT ATRIUM           Index LA diam:      2.70 cm 1.76 cm/m   RA Area:     18.50 cm LA Vol (A2C): 22.2 ml 14.47 ml/m  RA Volume:   49.30 ml  32.13 ml/m LA Vol (A4C): 15.0 ml 9.78 ml/m  AORTIC VALVE LVOT Vmax:   78.30 cm/s LVOT Vmean:  50.100 cm/s LVOT VTI:    0.130 m  AORTA Ao Root diam: 2.60 cm Ao Asc diam:  2.90 cm MITRAL VALVE               TRICUSPID VALVE MV Area (PHT): 6.11 cm    TR Peak grad:   61.5 mmHg MV E velocity: 66.00 cm/s  TR Vmax:        392.00 cm/s MV A velocity: 55.80 cm/s MV E/A ratio:  1.18        SHUNTS                            Systemic VTI:  0.13 m                            Systemic Diam: 2.10 cm Annabella Scarce MD Electronically signed by Annabella Scarce MD Signature Date/Time: 05/30/2023/5:12:40 PM    Final    Medications:  cefTRIAXone  (ROCEPHIN )  IV 2 g (06/01/23 0933)   dextrose  40 mL/hr at 05/31/23 2102   metronidazole  500 mg (06/01/23 0052)    Chlorhexidine  Gluconate Cloth  6 each Topical Q0600   Chlorhexidine  Gluconate Cloth  6 each Topical Q0600   heparin  injection (subcutaneous)  5,000 Units Subcutaneous Q8H   influenza vaccine adjuvanted  0.5 mL Intramuscular Tomorrow-1000   insulin  aspart  0-4 Units Subcutaneous Q6H   labetalol   100 mg Oral BID   rosuvastatin   10 mg Oral Daily   sodium zirconium cyclosilicate   10 g Oral Once    Dialysis Orders: Center: [facility]  on MWF. 180NRe4 hours BFR 400 DFR Aut o1.5 EDW 46.5kg 2K 3Ca AVG  16g Heparin  2500 unit bolus Mircera 50mcg IV q 2 weeks Calcitriol 0.25mcg PO q HD Korsuva 0.5ml q HD  Assessment/Plan:  Hyperglycemia: Very poorly controlled T1DM with recent insulin  non-compliance. BS >900 in the ED, started on insulin  drip, now off. Management per admitting team.   ESRD:  HD schedule is MWF but she typically only attends dialysis two times per week for 3/4 prescribed hours-reports recent transportation issues. She is volume overloaded but this is chronic. Had HD late last night, would like to wait until tomorrow until next  HD. Will plan for extra HD Thursday for volume then resume MWF schedule.   Hypertension/volume: ++ ascites and edema on exam due to chronic noncompliance with HD and fluid restrictions. Respiratory status is stable. Extra HD Thursday, see above. UF with HD as tolerated  Anemia: Hgb in the 12's, no ESA indicated at this time.   Metabolic bone disease: Calcium  and phos controlled, continue calcitriol and phos binders once tolerating PO  Nutrition:  Currently NPO  Lucie Collet, PA-C 06/01/2023, 10:30 AM  [facility] Pager: [phone]

## 2023-06-01 NOTE — Progress Notes (Signed)
 [facility] PROGRESS NOTE  Ann Robertson (DOB: [birth year]) FMW:[id number] PCP: Elizbeth Leita Ruth, FNP  Brief Narrative: Ann Robertson is a 33 y.o. female with a history of poorly-controlled T1DM, ESRD on HD MWF, often misses dialysis and cuts it short, HTN, HLD who presented to the ED 12/29 with abdominal distention. She was found to be in DKA having not taken insulin  at home and with massive ascites. IV insulin  started with improvement and subsequent conversion to subcutaneous insulin  12/31. Paracentesis was performed, suggestive of transudate with negative cultures, though we're covering for SBP empirically. Nephrology has been following, writing orders for dialysis, though this has been delayed by holiday availability and patient's refusal at times. Did dialyze late 12/31 and has refused return to HD 1/1, will be dialyzed with UF 1/2.   CT abdomen and pelvis- showed mucosal enhancement in the stomach and proximal small bowel consistent with gastroenteritis, in addition to demonstrating wall thickening in the ascending, transverse, and descending colon, suggestive of colitis, the absence of any evidence of obstruction, abscess, or perforation, evidence of ascites, without mention of cirrhotic appearing liver, no evidence of acute cholecystitis, or any evidence of acute pancreatitis.   Subjective: Keeps covers over her head, answers in one word and doesn't answer some questions. Reports she feels a bit better today, but very tired. No vomiting.  Objective: BP (!) 150/92   Pulse 86   Temp 98.3 F (36.8 C) (Oral)   Resp 18   Ht 5' 5 (1.651 m)   Wt 58.2 kg Comment: bed not sure if accurate  LMP  (LMP Unknown)   SpO2 96%   BMI 21.35 kg/m   Gen: Limited by cover over pt's head Pulm: Clear, nonlabored anteriorly  CV: RRR, ++edema GI: Soft distention without focal tenderness or rebound or guarding, +BS  Assessment & Plan: Severe hyperglycemia due to not taking  insulin , poorly-controlled/very brittle T1DM: HbA1c still >15.5%. Converted from insulin  gtt started on admission to subcutaneous insulin  12/31. Now with hypoglycemia, of which she has a history as well, in the setting of poor oral intake. - Continue carb-modified diet and very sensitive SSI and prn dextrose  pushes, holding basal insulin  for now.  Volume overload, ESRD: Usually MWF, got inpatient dialysis 12/31, refuses 1/1, remains overloaded but this is somewhat chronic per nephrology.  - Continue HD as pt allows, planning extra HD 1/2 for volume, then return to typical MWF schedule.  - Supplement protein as able since hypoalbumineia contributing.   Ascites: s/p paracentesis in ED 12/30 which showed albumin  of 2 not suggestive of portal HTN (no cirrhotic liver morphology either), suspect this is part of volume overload. PMN count was 9 not consistent with SBP. WBC normal, afebrile, no peritonitis by exam.  - Repeat HD to address underlying etiology.  - Consider repeat paracentesis if this is persistent at follow up.  - Follow up body fluid culture (neg gram stain and NGTD currently). Will complete 5 days of ceftriaxone /flagyl  on 1/2 empirically   Gastroenterocolitis CT correlates with the same and patient admitted with nausea vomiting and abdominal pain and diarrhea. ?if accentuated radiographic changes due to bodywide edema. - This has improved since admission.  Continue supportive measures with Zofran  and Ativan .  - Continue empiric abx as above.   HTN:  - Continue labetalol   HLD:  - Continue statin  Anemia of ESRD: Per nephrology.  Pseudohyponatremia: Noted.        Bernardino KATHEE Come, MD [facility] [email or web address] 06/01/2023, 2:39 PM

## 2023-06-01 NOTE — Plan of Care (Signed)
  Problem: Nutritional: Goal: Maintenance of adequate nutrition will improve Outcome: Not Progressing

## 2023-06-02 DIAGNOSIS — R739 Hyperglycemia, unspecified: Secondary | ICD-10-CM | POA: Diagnosis not present

## 2023-06-02 LAB — RENAL FUNCTION PANEL
Albumin: 2.2 g/dL — ABNORMAL LOW (ref 3.5–5.0)
Albumin: 2.3 g/dL — ABNORMAL LOW (ref 3.5–5.0)
Anion gap: 10 (ref 5–15)
Anion gap: 12 (ref 5–15)
BUN: 36 mg/dL — ABNORMAL HIGH (ref 6–20)
BUN: 40 mg/dL — ABNORMAL HIGH (ref 6–20)
CO2: 24 mmol/L (ref 22–32)
CO2: 25 mmol/L (ref 22–32)
Calcium: 7.5 mg/dL — ABNORMAL LOW (ref 8.9–10.3)
Calcium: 7.6 mg/dL — ABNORMAL LOW (ref 8.9–10.3)
Chloride: 91 mmol/L — ABNORMAL LOW (ref 98–111)
Chloride: 94 mmol/L — ABNORMAL LOW (ref 98–111)
Creatinine, Ser: 4.79 mg/dL — ABNORMAL HIGH (ref 0.44–1.00)
Creatinine, Ser: 5.05 mg/dL — ABNORMAL HIGH (ref 0.44–1.00)
GFR, Estimated: 11 mL/min — ABNORMAL LOW (ref >60)
GFR, Estimated: 12 mL/min — ABNORMAL LOW (ref >60)
Glucose, Bld: 115 mg/dL — ABNORMAL HIGH (ref 70–99)
Glucose, Bld: 81 mg/dL (ref 70–99)
Phosphorus: 4.7 mg/dL — ABNORMAL HIGH (ref 2.5–4.6)
Phosphorus: 5.1 mg/dL — ABNORMAL HIGH (ref 2.5–4.6)
Potassium: 3.9 mmol/L (ref 3.5–5.1)
Potassium: 4.1 mmol/L (ref 3.5–5.1)
Sodium: 127 mmol/L — ABNORMAL LOW (ref 135–145)
Sodium: 129 mmol/L — ABNORMAL LOW (ref 135–145)

## 2023-06-02 LAB — CBC
HCT: 28.5 % — ABNORMAL LOW (ref 36.0–46.0)
Hemoglobin: 9.2 g/dL — ABNORMAL LOW (ref 12.0–15.0)
MCH: 27.9 pg (ref 26.0–34.0)
MCHC: 32.3 g/dL (ref 30.0–36.0)
MCV: 86.4 fL (ref 80.0–100.0)
Platelets: 122 10*3/uL — ABNORMAL LOW (ref 150–400)
RBC: 3.3 MIL/uL — ABNORMAL LOW (ref 3.87–5.11)
RDW: 14.6 % (ref 11.5–15.5)
WBC: 5 10*3/uL (ref 4.0–10.5)
nRBC: 0 % (ref 0.0–0.2)

## 2023-06-02 LAB — GLUCOSE, CAPILLARY
Glucose-Capillary: 99 mg/dL (ref 70–99)
Glucose-Capillary: 58 mg/dL — ABNORMAL LOW (ref 70–99)
Glucose-Capillary: 97 mg/dL (ref 70–99)
Glucose-Capillary: 88 mg/dL (ref 70–99)
Glucose-Capillary: 80 mg/dL (ref 70–99)

## 2023-06-02 LAB — BODY FLUID CULTURE W GRAM STAIN: Gram Stain: NONE SEEN

## 2023-06-02 MED ORDER — OXYCODONE HCL 5 MG PO TABS
5.0000 mg | ORAL_TABLET | Freq: Once | ORAL | Status: AC
  Administered 2023-06-02: 5 mg via ORAL
  Filled 2023-06-02: qty 1

## 2023-06-02 MED ORDER — ONDANSETRON HCL 4 MG PO TABS: 4.0000 mg | ORAL_TABLET | Freq: Three times a day (TID) | ORAL | 0 refills | Status: DC | PRN

## 2023-06-02 MED ORDER — PANTOPRAZOLE SODIUM 40 MG PO TBEC: 40.0000 mg | DELAYED_RELEASE_TABLET | Freq: Every day | ORAL | 1 refills | Status: DC

## 2023-06-02 NOTE — Care Management Important Message (Signed)
 Important Message  Patient Details  Name: Ann Robertson MRN: [id number] Date of Birth: 20-Jul-1990   Important Message Given:  Yes - Medicare IM     Dorena Bodo 06/02/2023, 3:26 PM

## 2023-06-02 NOTE — Progress Notes (Signed)
 Pt refused morning heparin shot as well as morning labs, A. Virgel Manifold, NP made aware

## 2023-06-02 NOTE — Progress Notes (Signed)
   06/02/23 1215  Vitals  BP 138/88  MAP (mmHg) 103  BP Location Right Arm  BP Method Automatic  Patient Position (if appropriate) Lying  Pulse Rate 77  ECG Heart Rate 77  Resp 15  Oxygen Therapy  SpO2 100 %  O2 Device Room Air  During Treatment Monitoring  Blood Flow Rate (mL/min) 0 mL/min  Arterial Pressure (mmHg) -0.8 mmHg  Venous Pressure (mmHg) -0.61 mmHg  TMP (mmHg) -50.7 mmHg  Ultrafiltration Rate (mL/min) 993 mL/min  Dialysate Flow Rate (mL/min) 0 ml/min  Duration of HD Treatment -hour(s) 3 hour(s)  Cumulative Fluid Removed (mL) per Treatment  2500.22  Post Treatment  Dialyzer Clearance Lightly streaked  Fluid Removed (mL) 2500 mL  Tolerated HD Treatment Yes  Post-Hemodialysis Comments PT STABLE  AVG/AVF Arterial Site Held (minutes) 10 minutes  AVG/AVF Venous Site Held (minutes) 5 minutes  Note  Patient Observations ALERT NO C/O  Fistula / Graft Left Upper arm Arteriovenous vein graft  Placement Date/Time: 06/19/21 0850   Placed prior to admission: No  Orientation: Left  Access Location: Upper arm  Access Type: Arteriovenous vein graft  Expiration Date: 02/15/26  Site Condition No complications  Fistula / Graft Assessment Present;Thrill;Bruit  Status Deaccessed  Drainage Description None   Received patient in bed to unit.  Alert and oriented.  Informed consent signed and in chart.   TX duration:3 HOURS  Patient tolerated well.  Transported back to the room  Alert, without acute distress.  Hand-off given to patient's nurse.   Access used: LUA GRAFT Access issues: NONE  Total UF removed: 2500 Medication(s) given: NONE Post HD VS: SEE ABOVE Post HD weight: 47 KH   Docia CHRISTELLA [facility]

## 2023-06-02 NOTE — Discharge Planning (Addendum)
 [facility] Patient Discharge Orders- [facility] CLINIC: [place]  Patient's name: Ann Robertson Admit/DC Dates: 05/29/2023 - 06/01/22  Discharge Diagnoses: Hyperglycemia   Nausea/vomiting Volume overload  Aranesp : Given: no   Date and amount of last dose: N/A  Last Hgb: 9.2 PRBC's Given: no Date/# of units: N/A ESA dose for discharge: mircera 50 mcg IV q 2 weeks  IV Iron dose at discharge: none  Heparin  change: no  EDW Change: no New EDW:   [place] Change: no  Access intervention/Change: no Details:  Hectorol /Calcitriol change: no  Discharge Labs: Calcium  7.6 Phosphorus 5.1 Albumin  2.3 K+ 4.1  IV Antibiotics: no Details:  On Coumadin?: no Last INR: Next INR: Managed By:   OTHER/APPTS/LAB ORDERS: Continue weekly Calcium  checks    D/C Meds to be reconciled by nurse after every discharge.  Completed By: Lucie Collet, PA-C 06/02/2023, 1:33 PM  [facility] Pager: [phone]   Reviewed by: MD:______ RN_______

## 2023-06-02 NOTE — Discharge Summary (Addendum)
 Physician Discharge Summary   Patient: Ann Robertson MRN: [id number] DOB: 03-31-1991  Admit date:     05/29/2023  Discharge date: 06/02/23  Discharge Physician: Bernardino KATHEE Come   PCP: Elizbeth Leita Ruth, FNP   Recommendations at discharge:  Follow up with hemodialysis MWF on schedule to continue fluid removal.  Follow up with PCP/endocrinology for continued management of brittle diabetes. Follow up final culture results from paracentesis performed 12/30 (NGTD, neg gram stain, completed 5 days empiric SBP Tx) Recheck CBC to ensure stability of anemia (Hgb 9.2g/dl on day of discharge without bleeding) Patient reports chronic reflux symptoms, so started PPI. Offered esophagram/GI evaluation, but pt prefers to discharge at this time. She did tolerate food this afternoon, albeit with nausea. Recommend GI follow up.  Discharge Diagnoses: Principal Problem:   Hyperglycemia Active Problems:   Type 1 diabetes mellitus (HCC)   Nausea & vomiting   Essential hypertension   History of anemia due to chronic kidney disease   HLD (hyperlipidemia)   End-stage renal disease on hemodialysis [facility])   Ascites   Gastroenteritis  Hospital Course: [name] is a 33 y.o. female with a history of poorly-controlled T1DM, ESRD on HD MWF, often misses dialysis and cuts it short, HTN, HLD who presented to the ED 12/29 with abdominal distention. She was found to be in DKA having not taken insulin  at home and with massive ascites. IV insulin  started with improvement and subsequent conversion to subcutaneous insulin  12/31. Paracentesis was performed, suggestive of transudate with negative cultures, though we're covering for SBP empirically. Nephrology has been following, writing orders for dialysis, though this has been delayed by holiday availability and patient's refusal at times. Did dialyze late 12/31 and has refused return to HD 1/1, was subsequently dialyzed on 1/2 prior to discharge.     CT abdomen and pelvis- showed mucosal enhancement in the stomach and proximal small bowel consistent with gastroenteritis, in addition to demonstrating wall thickening in the ascending, transverse, and descending colon, suggestive of colitis, the absence of any evidence of obstruction, abscess, or perforation, evidence of ascites, without mention of cirrhotic appearing liver, no evidence of acute cholecystitis, or any evidence of acute pancreatitis.   Assessment and Plan: Severe hyperglycemia due to not taking insulin , poorly-controlled/very brittle T1DM: HbA1c still >15.5%. Converted from insulin  gtt started on admission to subcutaneous insulin  12/31. Now with hypoglycemia, of which she has a history as well, in the setting of poor oral intake. - Patient needs to eat to avoid hypoglycemia. Has nausea is controlled with zofran  (prescribed) and her abdominal pain is improved. She is requesting discharge and blood sugars have stabilized. She is aware of need to both eat (to avoid hypoglycemia) and take insulin  (to avoid DKA), as long as she eats her home regimen of 3u basal and very sensitive sliding scale should be sufficient.   Volume overload, ESRD: Usually MWF, got inpatient dialysis 12/31, refused 1/1, dialyzed again 1/2 prior to discharge. Remains overloaded but much improved and this is somewhat chronic per nephrology. Return to typical MWF schedule.  - Supplement protein as able since hypoalbumineia contributing.    Ascites: s/p paracentesis in ED 12/30 which showed albumin  of 2 not suggestive of portal HTN (no cirrhotic liver morphology either), suspect this is part of volume overload. PMN count was 9 not consistent with SBP. WBC normal, afebrile, no peritonitis by exam.  - Repeat HD to address underlying etiology.  - Consider repeat paracentesis if this is persistent at follow up.  -  Follow up body fluid culture (neg gram stain and NGTD x3 days at time of discharge currently). Completed 5 days  of ceftriaxone /flagyl  on 1/2 empirically   Gastroenterocolitis CT correlates with the same and patient admitted with nausea vomiting and abdominal pain and diarrhea. ?if accentuated radiographic changes due to bodywide edema. - This has improved since admission.  Continue supportive measures with Zofran   - Completed empiric abx as above.    HTN:  - Continue labetalol    HLD:  - Continue statin   Anemia of ESRD: Per nephrology. Hgb initially suspected to have been a hemoconcentrated specimen (well above her baseline) and hgb has now trended back toward her usual baseline at 9.2g/dl. She has no bleeding. Recommend monitoring at follow up.   Pseudohyponatremia: Noted.   Thrombocytopenia: Improving. Recheck at follow up  Consultants: Nephrology Procedures performed: Paracentesis  Disposition: Home Diet recommendation: Renal DISCHARGE MEDICATION: Allergies as of 06/02/2023       Reactions   Dilaudid [hydromorphone] Itching, Rash        Medication List     TAKE these medications    Acidophilus Caps capsule Take 1 capsule by mouth 3 (three) times daily with meals.   Fiber-Lax 625 MG tablet Generic drug: polycarbophil Take 1 tablet (625 mg total) by mouth 2 (two) times daily.   furosemide  80 MG tablet Commonly known as: LASIX  Take 80 mg by mouth daily.   gabapentin  100 MG capsule Commonly known as: NEURONTIN  Take 1 capsule (100 mg total) by mouth 3 (three) times daily as needed (neuropathy).   hydrOXYzine  25 MG tablet Commonly known as: ATARAX  Take 25 mg by mouth.   insulin  lispro 100 UNIT/ML KwikPen Commonly known as: HUMALOG  Inject 0-6 Units into the skin 3 (three) times daily with meals. Check Blood Glucose (BG) and inject per scale: BG <150= 0 unit; BG 150-200= 1 unit; BG 201-250= 2 unit; BG 251-300= 3 unit; BG 301-350= 4 unit; BG 351-400= 5 unit; BG >400= 6 unit and Call Primary Care.   labetalol  100 MG tablet Commonly known as: NORMODYNE  Take 1 tablet (100 mg  total) by mouth 2 (two) times daily.   Lantus  SoloStar 100 UNIT/ML Solostar Pen Generic drug: insulin  glargine Inject 3 Units into the skin daily.   lidocaine -prilocaine  cream Commonly known as: EMLA  Apply 1 Application topically as needed (For port access).   lipase/protease/amylase 63999 UNITS Cpep capsule Commonly known as: Creon  Take 2 capsules (72,000 Units total) by mouth 3 (three) times daily with meals. May also take 1 capsule (36,000 Units total) as needed (with snacks - up to 4 snacks daily).   loperamide  2 MG capsule Commonly known as: IMODIUM  Take 2 capsules (4 mg total) by mouth as needed for diarrhea or loose stools. Not to exceed 8 caps per day   methocarbamol  750 MG tablet Commonly known as: ROBAXIN  Take 1 tablet (750 mg total) by mouth every 8 (eight) hours as needed for muscle spasms.   omeprazole  20 MG capsule Commonly known as: PRILOSEC Take 1 capsule (20 mg total) by mouth daily as needed (acid reflux/heartburn).   ondansetron  4 MG tablet Commonly known as: Zofran  Take 1 tablet (4 mg total) by mouth every 8 (eight) hours as needed for nausea or vomiting.   oxyCODONE -acetaminophen  10-325 MG tablet Commonly known as: PERCOCET Take 1 tablet by mouth every 6 (six) hours as needed for pain.   pantoprazole  40 MG tablet Commonly known as: Protonix  Take 1 tablet (40 mg total) by mouth daily.  rosuvastatin  10 MG tablet Commonly known as: CRESTOR  Take 1 tablet (10 mg total) by mouth daily.        Follow-up Information     Elizbeth Leita Ruth, FNP Follow up.   Specialty: Endocrinology Contact information: [address] [place] [address] [phone]                Discharge Exam: Fredricka Weights   05/31/23 1951 06/02/23 0247 06/02/23 0850  Weight: 58.2 kg 48 kg 49.4 kg  Tired appearing female in no distress receiving HD Clear, nonlabored RRR, ++edema,  Abd is soft and not appreciably tender (she complains more of my hands being  cold), does have distention and normoactive bowel sounds. Oriented, no focal deficits  Condition at discharge: stable  The results of significant diagnostics from this hospitalization (including imaging, microbiology, ancillary and laboratory) are listed below for reference.   Imaging Studies: ECHOCARDIOGRAM COMPLETE Result Date: 05/30/2023    ECHOCARDIOGRAM REPORT   Patient Name:   DEASIAH HAGBERG Date of Exam: 05/30/2023 Medical Rec #:  [id number]               Height:       65.0 in Accession #:    [id number]              Weight:       110.0 lb Date of Birth:  [birth year]               BSA:          1.534 m Patient Age:    32 years                BP:           146/101 mmHg Patient Gender: F                       HR:           93 bpm. Exam Location:  Inpatient Procedure: 2D Echo, Cardiac Doppler and Color Doppler Indications:    CHF  History:        Patient has no prior history of Echocardiogram examinations.                 Risk Factors:Hypertension and Diabetes.  Sonographer:    Lanell Maduro Referring Phys: [id number] JUSTIN B HOWERTER IMPRESSIONS  1. Left ventricular ejection fraction, by estimation, is 60 to 65%. The left ventricle has normal function. The left ventricle has no regional wall motion abnormalities. There is mild left ventricular hypertrophy of the septal segment. Left ventricular diastolic parameters were normal.  2. Right ventricular systolic function is normal. The right ventricular size is normal. There is severely elevated pulmonary artery systolic pressure.  3. Right atrial size was mildly dilated.  4. The mitral valve is normal in structure. No evidence of mitral valve regurgitation. No evidence of mitral stenosis.  5. Tricuspid valve regurgitation is moderate.  6. The aortic valve is tricuspid. Aortic valve regurgitation is not visualized. No aortic stenosis is present.  7. The inferior vena cava is normal in size with greater than 50% respiratory variability, suggesting  right atrial pressure of 3 mmHg. FINDINGS  Left Ventricle: Left ventricular ejection fraction, by estimation, is 60 to 65%. The left ventricle has normal function. The left ventricle has no regional wall motion abnormalities. The left ventricular internal cavity size was normal in size. There is  mild left ventricular hypertrophy of the septal segment. Left ventricular diastolic  parameters were normal. Right Ventricle: The right ventricular size is normal. No increase in right ventricular wall thickness. Right ventricular systolic function is normal. There is severely elevated pulmonary artery systolic pressure. The tricuspid regurgitant velocity is 3.92 m/s, and with an assumed right atrial pressure of 3 mmHg, the estimated right ventricular systolic pressure is 64.5 mmHg. Left Atrium: Left atrial size was normal in size. Right Atrium: Right atrial size was mildly dilated. Pericardium: Trivial pericardial effusion is present. Mitral Valve: The mitral valve is normal in structure. No evidence of mitral valve regurgitation. No evidence of mitral valve stenosis. Tricuspid Valve: The tricuspid valve is normal in structure. Tricuspid valve regurgitation is moderate . No evidence of tricuspid stenosis. Aortic Valve: The aortic valve is tricuspid. Aortic valve regurgitation is not visualized. No aortic stenosis is present. Pulmonic Valve: The pulmonic valve was normal in structure. Pulmonic valve regurgitation is not visualized. No evidence of pulmonic stenosis. Aorta: The aortic root is normal in size and structure. Venous: The inferior vena cava is normal in size with greater than 50% respiratory variability, suggesting right atrial pressure of 3 mmHg. IAS/Shunts: No atrial level shunt detected by color flow Doppler.  LEFT VENTRICLE PLAX 2D LVIDd:         4.10 cm     Diastology LVIDs:         2.90 cm     LV e' lateral:   9.90 cm/s LV PW:         0.90 cm     LV E/e' lateral: 6.7 LV IVS:        1.10 cm LVOT diam:     2.10  cm LV SV:         45 LV SV Index:   29 LVOT Area:     3.46 cm  LV Volumes (MOD) LV vol d, MOD A2C: 56.4 ml LV vol d, MOD A4C: 53.1 ml LV vol s, MOD A2C: 24.3 ml LV vol s, MOD A4C: 18.1 ml LV SV MOD A2C:     32.1 ml LV SV MOD A4C:     53.1 ml LV SV MOD BP:      33.7 ml RIGHT VENTRICLE RV Basal diam:  3.60 cm RV S prime:     11.10 cm/s TAPSE (M-mode): 2.0 cm LEFT ATRIUM           Index        RIGHT ATRIUM           Index LA diam:      2.70 cm 1.76 cm/m   RA Area:     18.50 cm LA Vol (A2C): 22.2 ml 14.47 ml/m  RA Volume:   49.30 ml  32.13 ml/m LA Vol (A4C): 15.0 ml 9.78 ml/m  AORTIC VALVE LVOT Vmax:   78.30 cm/s LVOT Vmean:  50.100 cm/s LVOT VTI:    0.130 m  AORTA Ao Root diam: 2.60 cm Ao Asc diam:  2.90 cm MITRAL VALVE               TRICUSPID VALVE MV Area (PHT): 6.11 cm    TR Peak grad:   61.5 mmHg MV E velocity: 66.00 cm/s  TR Vmax:        392.00 cm/s MV A velocity: 55.80 cm/s MV E/A ratio:  1.18        SHUNTS  Systemic VTI:  0.13 m                            Systemic Diam: 2.10 cm Annabella Scarce MD Electronically signed by Annabella Scarce MD Signature Date/Time: 05/30/2023/5:12:40 PM    Final    CT ABDOMEN PELVIS W CONTRAST Result Date: 05/30/2023 CLINICAL DATA:  Abdominal pain and swelling and swelling in the feet. History of end-stage renal failure and diabetes. EXAM: CT ABDOMEN AND PELVIS WITH CONTRAST TECHNIQUE: Multidetector CT imaging of the abdomen and pelvis was performed using the standard protocol following bolus administration of intravenous contrast. RADIATION DOSE REDUCTION: This exam was performed according to the departmental dose-optimization program which includes automated exposure control, adjustment of the mA and/or kV according to patient size and/or use of iterative reconstruction technique. CONTRAST:  65mL OMNIPAQUE  IOHEXOL  350 MG/ML SOLN COMPARISON:  CT without contrast 04/19/2021. FINDINGS: Lower chest: There are small right and minimal left layering  pleural effusions, with atelectatic changes in both bases. There is cardiomegaly with a right chamber predominance, likely indicating chronic right heart dysfunction and also evidenced by IVC and hepatic vein contrast reflux. There are coronary artery calcifications, age-advanced. Hepatobiliary: Loss of fine detail owing to massive ascites, body wall anasarca and artifact from overlying wires and patient's arms in the field. The liver is mildly steatotic. No obvious mass is seen and no calcified gallstones or gallbladder thickening. No biliary dilatation. Pancreas: Atrophic and otherwise unremarkable. 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 No mass enhancement. No splenomegaly. Adrenals/Urinary Tract: There is bilateral renal cortical thinning. No adrenal mass or renal mass enhancement. There is air in both collecting systems which could be due to recent instrumentation or infectious process. Air in the bladder is also noted. Bladder wall is nondistended. There is no evidence of vesicoenteric fistula. Stomach/Bowel: There is mucosal enhancement of the stomach and proximal small bowel consistent with gastroenteritis. No dilatation or wall thickening in the stomach and small bowel. The appendix is normal. There is either wall thickening or nondistention in the ascending, transverse and descending colon. Scattered sigmoid diverticula. Correlate clinically for underlying colitis. Unable to tell if there is adjacent inflammatory change due to massive ascites. Vascular/Lymphatic: Age-advanced aortic and branch vessel atherosclerosis. No AAA. No adenopathy is seen through the ascites. Reproductive: The uterus is intact. The ovaries are not enlarged. There are bilateral BTL clips, seen previously. Other: massive ascites and diffuse body wall anasarca. No free air. No free air hemorrhage or localizing collections. The ascites displaces the bowel inward and extends up around the liver and spleen. There  is no incarcerated hernia. Musculoskeletal: No acute or significant osseous findings. IMPRESSION: 1. Massive ascites and diffuse body wall anasarca. 2. Small right and minimal left pleural effusions. Basilar atelectasis. 3. Cardiomegaly with right chamber predominance, likely indicating chronic right heart dysfunction. 4. Age-advanced atherosclerosis. 5. Air in the renal collecting systems and bladder which could be due to recent instrumentation or infectious process. 6. Mucosal enhancement in the stomach and proximal small bowel consistent with gastroenteritis. 7. Wall thickening versus nondistention in the ascending, transverse and descending colon. Correlate clinically for underlying colitis. 8. Diverticulosis. 9. Aortic atherosclerosis. Aortic Atherosclerosis (ICD10-I70.0). Electronically Signed   By: Francis Quam M.D.   On: 05/30/2023 02:25   DG Chest Portable 1 View Result Date: 05/30/2023 CLINICAL DATA:  Abdominal pain with extremity swelling. Check volume status. EXAM: PORTABLE CHEST 1 VIEW COMPARISON:  Portable chest  02/02/2023 FINDINGS: There is a small right pleural effusion which was seen previously, with overlying atelectasis. Low inspiration on exam. There is increased left perihilar linear atelectasis. The hypoinflated lungs otherwise appear clear. There is no substantial left effusion. The heart silhouette is enlarged but probably exaggerated in part due to low inspiration. There is mild central vascular prominence which could be due to low lung volumes or mild perihilar vascular congestion. No edema is seen. The mediastinum is normally outlined.  The thoracic cage is intact. IMPRESSION: 1. Low inspiration on exam. 2. Small right pleural effusion with overlying atelectasis. 3. Increased left perihilar linear atelectasis. No visible infiltrate. 4. Cardiomegaly. 5. Mild central vascular prominence which could be due to low lung volumes or mild perihilar vascular congestion. No edema is seen.  Electronically Signed   By: Francis Quam M.D.   On: 05/30/2023 00:41    Microbiology: Results for orders placed or performed during the hospital encounter of 05/29/23  Resp panel by RT-PCR (RSV, Flu A&B, Covid) Anterior Nasal Swab     Status: None   Collection Time: 05/30/23 12:00 AM   Specimen: Anterior Nasal Swab  Result Value Ref Range Status   SARS Coronavirus 2 by RT PCR NEGATIVE NEGATIVE Final   Influenza A by PCR NEGATIVE NEGATIVE Final   Influenza B by PCR NEGATIVE NEGATIVE Final    Comment: (NOTE) The Xpert Xpress SARS-CoV-2/FLU/RSV plus assay is intended as an aid in the diagnosis of influenza from Nasopharyngeal swab specimens and should not be used as a sole basis for treatment. Nasal washings and aspirates are unacceptable for Xpert Xpress SARS-CoV-2/FLU/RSV testing.  Fact Sheet for Patients: bloggercourse.com  Fact Sheet for Healthcare Providers: seriousbroker.it  This test is not yet approved or cleared by the [place]  FDA and has been authorized for detection and/or diagnosis of SARS-CoV-2 by FDA under an Emergency Use Authorization (EUA). This EUA will remain in effect (meaning this test can be used) for the duration of the COVID-19 declaration under Section 564(b)(1) of the Act, 21 U.S.C. section 360bbb-3(b)(1), unless the authorization is terminated or revoked.     Resp Syncytial Virus by PCR NEGATIVE NEGATIVE Final    Comment: (NOTE) Fact Sheet for Patients: bloggercourse.com  Fact Sheet for Healthcare Providers: seriousbroker.it  This test is not yet approved or cleared by the [place]  FDA and has been authorized for detection and/or diagnosis of SARS-CoV-2 by FDA under an Emergency Use Authorization (EUA). This EUA will remain in effect (meaning this test can be used) for the duration of the COVID-19 declaration under Section 564(b)(1) of  the Act, 21 U.S.C. section 360bbb-3(b)(1), unless the authorization is terminated or revoked.  Performed at [facility], [address], [place], [place] [address]   Body fluid culture w Gram Stain     Status: None   Collection Time: 05/30/23  4:24 AM   Specimen: Peritoneal Fluid  Result Value Ref Range Status   Specimen Description FLUID PERITONEAL  Final   Special Requests NONE  Final   Gram Stain NO WBC SEEN NO ORGANISMS SEEN   Final   Culture   Final    NO GROWTH 3 DAYS Performed at [facility], [address], [place], [place] [address]    Report Status 06/02/2023 FINAL  Final    Labs: CBC: Recent Labs  Lab 05/30/23 0114 05/30/23 0128 05/30/23 0129 05/30/23 1616 06/02/23 0909  WBC 7.1  --   --   --  5.0  NEUTROABS 5.5  --   --   --   --   HGB 10.2* 12.9 12.9 13.6 9.2*  HCT 32.0* 38.0 38.0 40.0 28.5*  MCV 88.2  --   --   --  86.4  PLT 106*  --   --   --  122*   Basic Metabolic Panel: Recent Labs  Lab 05/30/23 0810 05/30/23 1300 05/30/23 1616 05/31/23 0657 06/01/23 2341 06/02/23 0909  NA 127* 129* 130* 129* 127* 129*  K 3.7 3.4* 3.3* 3.7 3.9 4.1  CL 90* 93*  --  89* 91* 94*  CO2 19* 18*  --  24 24 25   GLUCOSE 592* 272*  --  95 115* 81  BUN 57* 56*  --  59* 36* 40*  CREATININE 6.51* 6.13*  --  6.61* 4.79* 5.05*  CALCIUM  8.6* 8.1*  --  8.1* 7.5* 7.6*  MG 1.8  --   --   --   --   --   PHOS 4.9*  --   --   --  4.7* 5.1*   Liver Function Tests: Recent Labs  Lab 05/30/23 0200 06/01/23 2341 06/02/23 0909  AST 15  --   --   ALT 10  --   --   ALKPHOS 134*  --   --   BILITOT 1.4*  --   --   PROT 6.2*  --   --   ALBUMIN  2.6* 2.2* 2.3*   CBG: Recent Labs  Lab 06/01/23 2325 06/02/23 0522 06/02/23 0553 06/02/23 0741 06/02/23 1056  GLUCAP 99 58* 97 88 80    Discharge time spent: greater than 30 minutes.  Signed: Bernardino KATHEE Come, MD [facility] 06/02/2023

## 2023-06-02 NOTE — Progress Notes (Signed)
Pt off the unit to dialysis.

## 2023-06-02 NOTE — Progress Notes (Signed)
 Pt returned to unit from dialysis. Denies pain at this time. A&O x 4. Verbalizes readiness to discharge. VS and focused assessment documented.

## 2023-06-02 NOTE — Progress Notes (Signed)
 Pt informed she will be going to dialysis around 0800 c/o pain 9/10 generalized unable to admin oxy until 0900. MD notified see new order.

## 2023-06-02 NOTE — Progress Notes (Signed)
 Hypoglycemic Event  CBG: 58  Treatment: 1/2 Amp D50  Symptoms: None  Follow-up CBG: Time:0550 CBG Result:97  Possible Reasons for Event: Inadequate meal intake  Comments/MD notified: A. Marca Ancona

## 2023-06-02 NOTE — Procedures (Signed)
 I was present at this dialysis session. I have reviewed the session itself and made appropriate changes.   Vital signs in last 24 hours:  Temp:  [97.7 F (36.5 C)-98.5 F (36.9 C)] 97.7 F (36.5 C) (01/02 0734) Pulse Rate:  [75-88] 75 (01/02 0734) Resp:  [8-18] 12 (01/02 0734) BP: (134-162)/(90-101) 134/92 (01/02 0734) SpO2:  [96 %-100 %] 99 % (01/02 0734) Weight:  [48 kg] 48 kg (01/02 0247) Weight change: -10.2 kg Filed Weights   05/31/23 0500 05/31/23 1951 06/02/23 0247  Weight: 55.4 kg 58.2 kg 48 kg    Recent Labs  Lab 06/01/23 2341  NA 127*  K 3.9  CL 91*  CO2 24  GLUCOSE 115*  BUN 36*  CREATININE 4.79*  CALCIUM  7.5*  PHOS 4.7*    Recent Labs  Lab 05/30/23 0114 05/30/23 0128 05/30/23 0129 05/30/23 1616  WBC 7.1  --   --   --   NEUTROABS 5.5  --   --   --   HGB 10.2* 12.9 12.9 13.6  HCT 32.0* 38.0 38.0 40.0  MCV 88.2  --   --   --   PLT 106*  --   --   --     Scheduled Meds:  Chlorhexidine  Gluconate Cloth  6 each Topical Q0600   Chlorhexidine  Gluconate Cloth  6 each Topical Q0600   heparin  injection (subcutaneous)  5,000 Units Subcutaneous Q8H   influenza vaccine adjuvanted  0.5 mL Intramuscular Tomorrow-1000   insulin  aspart  0-4 Units Subcutaneous Q6H   labetalol   100 mg Oral BID   rosuvastatin   10 mg Oral Daily   sodium zirconium cyclosilicate   10 g Oral Once   Continuous Infusions:  cefTRIAXone  (ROCEPHIN )  IV Stopped (06/01/23 1003)   metronidazole  500 mg (06/01/23 2120)   PRN Meds:.acetaminophen  **OR** acetaminophen , dextrose , fentaNYL  (SUBLIMAZE ) injection, LORazepam , melatonin, naLOXone  (NARCAN )  injection, ondansetron  (ZOFRAN ) IV, oxyCODONE    Fairy DELENA Sellar,  MD 06/02/2023, 9:09 AM

## 2023-06-02 NOTE — Progress Notes (Signed)
 Pt with orders to d/c home. PIVs removed. Pt is stable. Discharge education and packet provided to patient, all questions answered. Prescriptions sent to pharmacy pt is aware. Pt and all belongings transported via wheelchair to private vehicle.

## 2023-06-02 NOTE — Progress Notes (Signed)
 D/C order noted. Contacted [facility] to be advised of pt's d/c today and that pt should resume care tomorrow.   Olivia Canter Renal Navigator [phone]

## 2023-06-03 ENCOUNTER — Telehealth: Payer: Self-pay | Admitting: Nephrology

## 2023-06-03 NOTE — Telephone Encounter (Signed)
 Transition of Care - Initial Contact from Inpatient Facility  Date of discharge: 06/02/23 Date of contact: 06/03/23 Method: Phone Spoke to: Patient  Patient contacted to discuss transition of care from recent inpatient hospitalization. Patient was admitted to [facility] from 05/29/23 -06/02/23  with discharge diagnosis of severe hyperglycemia   The discharge medication list was reviewed.   Patient will return to his/her outpatient HD unit on: Next dialysis on Monday.   No other concerns at this time.

## 2023-06-12 ENCOUNTER — Encounter (HOSPITAL_COMMUNITY): Payer: Self-pay

## 2023-06-12 ENCOUNTER — Emergency Department (HOSPITAL_COMMUNITY): Payer: Medicare HMO

## 2023-06-12 ENCOUNTER — Inpatient Hospital Stay (HOSPITAL_COMMUNITY)
Admission: EM | Admit: 2023-06-12 | Discharge: 2023-06-17 | DRG: 100 | Disposition: A | Discharge status: Home Health | Payer: Medicare HMO | Attending: Family Medicine | Admitting: Family Medicine

## 2023-06-12 DIAGNOSIS — Z8619 Personal history of other infectious and parasitic diseases: Secondary | ICD-10-CM

## 2023-06-12 DIAGNOSIS — G9349 Other encephalopathy: Secondary | ICD-10-CM | POA: Diagnosis present

## 2023-06-12 DIAGNOSIS — I120 Hypertensive chronic kidney disease with stage 5 chronic kidney disease or end stage renal disease: Secondary | ICD-10-CM | POA: Diagnosis present

## 2023-06-12 DIAGNOSIS — Z841 Family history of disorders of kidney and ureter: Secondary | ICD-10-CM

## 2023-06-12 DIAGNOSIS — Z992 Dependence on renal dialysis: Secondary | ICD-10-CM

## 2023-06-12 DIAGNOSIS — E1065 Type 1 diabetes mellitus with hyperglycemia: Secondary | ICD-10-CM | POA: Diagnosis present

## 2023-06-12 DIAGNOSIS — Z83438 Family history of other disorder of lipoprotein metabolism and other lipidemia: Secondary | ICD-10-CM

## 2023-06-12 DIAGNOSIS — Z79899 Other long term (current) drug therapy: Secondary | ICD-10-CM | POA: Diagnosis not present

## 2023-06-12 DIAGNOSIS — D696 Thrombocytopenia, unspecified: Secondary | ICD-10-CM | POA: Diagnosis present

## 2023-06-12 DIAGNOSIS — R011 Cardiac murmur, unspecified: Secondary | ICD-10-CM | POA: Diagnosis present

## 2023-06-12 DIAGNOSIS — E1042 Type 1 diabetes mellitus with diabetic polyneuropathy: Secondary | ICD-10-CM | POA: Diagnosis present

## 2023-06-12 DIAGNOSIS — K3184 Gastroparesis: Secondary | ICD-10-CM | POA: Diagnosis present

## 2023-06-12 DIAGNOSIS — N186 End stage renal disease: Secondary | ICD-10-CM | POA: Diagnosis present

## 2023-06-12 DIAGNOSIS — E1043 Type 1 diabetes mellitus with diabetic autonomic (poly)neuropathy: Secondary | ICD-10-CM | POA: Diagnosis present

## 2023-06-12 DIAGNOSIS — K219 Gastro-esophageal reflux disease without esophagitis: Secondary | ICD-10-CM | POA: Diagnosis present

## 2023-06-12 DIAGNOSIS — N2581 Secondary hyperparathyroidism of renal origin: Secondary | ICD-10-CM | POA: Diagnosis present

## 2023-06-12 DIAGNOSIS — D631 Anemia in chronic kidney disease: Secondary | ICD-10-CM | POA: Diagnosis present

## 2023-06-12 DIAGNOSIS — Z794 Long term (current) use of insulin: Secondary | ICD-10-CM | POA: Diagnosis not present

## 2023-06-12 DIAGNOSIS — Z885 Allergy status to narcotic agent status: Secondary | ICD-10-CM

## 2023-06-12 DIAGNOSIS — Z8249 Family history of ischemic heart disease and other diseases of the circulatory system: Secondary | ICD-10-CM

## 2023-06-12 DIAGNOSIS — A419 Sepsis, unspecified organism: Secondary | ICD-10-CM | POA: Diagnosis present

## 2023-06-12 DIAGNOSIS — Z833 Family history of diabetes mellitus: Secondary | ICD-10-CM

## 2023-06-12 DIAGNOSIS — R188 Other ascites: Secondary | ICD-10-CM | POA: Diagnosis present

## 2023-06-12 DIAGNOSIS — F17210 Nicotine dependence, cigarettes, uncomplicated: Secondary | ICD-10-CM | POA: Diagnosis present

## 2023-06-12 DIAGNOSIS — E7800 Pure hypercholesterolemia, unspecified: Secondary | ICD-10-CM | POA: Diagnosis present

## 2023-06-12 DIAGNOSIS — R4182 Altered mental status, unspecified: Principal | ICD-10-CM

## 2023-06-12 DIAGNOSIS — Z91199 Patient's noncompliance with other medical treatment and regimen due to unspecified reason: Secondary | ICD-10-CM

## 2023-06-12 DIAGNOSIS — R7401 Elevation of levels of liver transaminase levels: Secondary | ICD-10-CM | POA: Diagnosis present

## 2023-06-12 DIAGNOSIS — R748 Abnormal levels of other serum enzymes: Secondary | ICD-10-CM | POA: Diagnosis present

## 2023-06-12 DIAGNOSIS — R601 Generalized edema: Secondary | ICD-10-CM | POA: Diagnosis not present

## 2023-06-12 DIAGNOSIS — K224 Dyskinesia of esophagus: Secondary | ICD-10-CM | POA: Diagnosis present

## 2023-06-12 DIAGNOSIS — R739 Hyperglycemia, unspecified: Secondary | ICD-10-CM | POA: Diagnosis not present

## 2023-06-12 DIAGNOSIS — E871 Hypo-osmolality and hyponatremia: Secondary | ICD-10-CM | POA: Diagnosis present

## 2023-06-12 DIAGNOSIS — K3189 Other diseases of stomach and duodenum: Secondary | ICD-10-CM | POA: Diagnosis present

## 2023-06-12 DIAGNOSIS — E039 Hypothyroidism, unspecified: Secondary | ICD-10-CM | POA: Diagnosis present

## 2023-06-12 DIAGNOSIS — R569 Unspecified convulsions: Secondary | ICD-10-CM | POA: Diagnosis present

## 2023-06-12 DIAGNOSIS — E8770 Fluid overload, unspecified: Secondary | ICD-10-CM | POA: Diagnosis present

## 2023-06-12 DIAGNOSIS — E1022 Type 1 diabetes mellitus with diabetic chronic kidney disease: Secondary | ICD-10-CM | POA: Diagnosis present

## 2023-06-12 DIAGNOSIS — K529 Noninfective gastroenteritis and colitis, unspecified: Secondary | ICD-10-CM | POA: Diagnosis present

## 2023-06-12 LAB — I-STAT ARTERIAL BLOOD GAS, ED
Acid-Base Excess: 3 mmol/L — ABNORMAL HIGH (ref 0.0–2.0)
Bicarbonate: 28.2 mmol/L — ABNORMAL HIGH (ref 20.0–28.0)
Calcium, Ion: 1.09 mmol/L — ABNORMAL LOW (ref 1.15–1.40)
HCT: 30 % — ABNORMAL LOW (ref 36.0–46.0)
Hemoglobin: 10.2 g/dL — ABNORMAL LOW (ref 12.0–15.0)
O2 Saturation: 100 %
Patient temperature: 101
Potassium: 4.2 mmol/L (ref 3.5–5.1)
Sodium: 122 mmol/L — ABNORMAL LOW (ref 135–145)
TCO2: 30 mmol/L (ref 22–32)
pCO2 arterial: 49.3 mm[Hg] — ABNORMAL HIGH (ref 32–48)
pH, Arterial: 7.371 (ref 7.35–7.45)
pO2, Arterial: 210 mm[Hg] — ABNORMAL HIGH (ref 83–108)

## 2023-06-12 LAB — CBC WITH DIFFERENTIAL/PLATELET
Abs Immature Granulocytes: 0 10*3/uL (ref 0.00–0.07)
Basophils Absolute: 0 10*3/uL (ref 0.0–0.1)
Basophils Relative: 0 %
Eosinophils Absolute: 0 10*3/uL (ref 0.0–0.5)
Eosinophils Relative: 1 %
HCT: 34.6 % — ABNORMAL LOW (ref 36.0–46.0)
Hemoglobin: 9.7 g/dL — ABNORMAL LOW (ref 12.0–15.0)
Immature Granulocytes: 0 %
Lymphocytes Relative: 40 %
Lymphs Abs: 0.9 10*3/uL (ref 0.7–4.0)
MCH: 27.2 pg (ref 26.0–34.0)
MCHC: 28 g/dL — ABNORMAL LOW (ref 30.0–36.0)
MCV: 96.9 fL (ref 80.0–100.0)
Monocytes Absolute: 0.1 10*3/uL (ref 0.1–1.0)
Monocytes Relative: 4 %
Neutro Abs: 1.3 10*3/uL — ABNORMAL LOW (ref 1.7–7.7)
Neutrophils Relative %: 55 %
Platelets: 85 10*3/uL — ABNORMAL LOW (ref 150–400)
RBC: 3.57 MIL/uL — ABNORMAL LOW (ref 3.87–5.11)
RDW: 14.5 % (ref 11.5–15.5)
WBC: 2.3 10*3/uL — ABNORMAL LOW (ref 4.0–10.5)
nRBC: 0 % (ref 0.0–0.2)

## 2023-06-12 LAB — I-STAT CHEM 8, ED
BUN: 15 mg/dL (ref 6–20)
Calcium, Ion: 0.95 mmol/L — ABNORMAL LOW (ref 1.15–1.40)
Chloride: 85 mmol/L — ABNORMAL LOW (ref 98–111)
Creatinine, Ser: 4.9 mg/dL — ABNORMAL HIGH (ref 0.44–1.00)
Glucose, Bld: >700 mg/dL (ref 70–99)
HCT: 38 % (ref 36.0–46.0)
Hemoglobin: 12.9 g/dL (ref 12.0–15.0)
Potassium: 3.9 mmol/L (ref 3.5–5.1)
Sodium: 122 mmol/L — ABNORMAL LOW (ref 135–145)
TCO2: 23 mmol/L (ref 22–32)

## 2023-06-12 LAB — COMPREHENSIVE METABOLIC PANEL
ALT: 86 U/L — ABNORMAL HIGH (ref 0–44)
AST: 388 U/L — ABNORMAL HIGH (ref 15–41)
Albumin: 3.2 g/dL — ABNORMAL LOW (ref 3.5–5.0)
Alkaline Phosphatase: 297 U/L — ABNORMAL HIGH (ref 38–126)
Anion gap: 13 (ref 5–15)
BUN: 13 mg/dL (ref 6–20)
CO2: 23 mmol/L (ref 22–32)
Calcium: 7.8 mg/dL — ABNORMAL LOW (ref 8.9–10.3)
Chloride: 84 mmol/L — ABNORMAL LOW (ref 98–111)
Creatinine, Ser: 4.88 mg/dL — ABNORMAL HIGH (ref 0.44–1.00)
GFR, Estimated: 11 mL/min — ABNORMAL LOW (ref >60)
Glucose, Bld: 1094 mg/dL (ref 70–99)
Potassium: 3.8 mmol/L (ref 3.5–5.1)
Sodium: 120 mmol/L — ABNORMAL LOW (ref 135–145)
Total Bilirubin: 0.9 mg/dL (ref 0.0–1.2)
Total Protein: 7.9 g/dL (ref 6.5–8.1)

## 2023-06-12 LAB — I-STAT VENOUS BLOOD GAS, ED
Acid-base deficit: 1 mmol/L (ref 0.0–2.0)
Bicarbonate: 25.5 mmol/L (ref 20.0–28.0)
Calcium, Ion: 0.94 mmol/L — ABNORMAL LOW (ref 1.15–1.40)
HCT: 38 % (ref 36.0–46.0)
Hemoglobin: 12.9 g/dL (ref 12.0–15.0)
O2 Saturation: 86 %
Potassium: 3.9 mmol/L (ref 3.5–5.1)
Sodium: 122 mmol/L — ABNORMAL LOW (ref 135–145)
TCO2: 27 mmol/L (ref 22–32)
pCO2, Ven: 47.5 mm[Hg] (ref 44–60)
pH, Ven: 7.337 (ref 7.25–7.43)
pO2, Ven: 56 mm[Hg] — ABNORMAL HIGH (ref 32–45)

## 2023-06-12 LAB — BETA-HYDROXYBUTYRIC ACID: Beta-Hydroxybutyric Acid: <0.05 mmol/L — ABNORMAL LOW (ref 0.05–0.27)

## 2023-06-12 LAB — LIPASE, BLOOD: Lipase: 25 U/L (ref 11–51)

## 2023-06-12 LAB — CBG MONITORING, ED: Glucose-Capillary: >600 mg/dL (ref 70–99)

## 2023-06-12 LAB — I-STAT CG4 LACTIC ACID, ED: Lactic Acid, Venous: 3.4 mmol/L (ref 0.5–1.9)

## 2023-06-12 LAB — HCG, SERUM, QUALITATIVE: Preg, Serum: NEGATIVE

## 2023-06-12 MED ORDER — VANCOMYCIN HCL IN DEXTROSE 1-5 GM/200ML-% IV SOLN
1000.0000 mg | Freq: Once | INTRAVENOUS | Status: AC
  Administered 2023-06-12: 1000 mg via INTRAVENOUS
  Filled 2023-06-12: qty 200

## 2023-06-12 MED ORDER — INSULIN GLARGINE-YFGN 100 UNIT/ML ~~LOC~~ SOLN
3.0000 [IU] | SUBCUTANEOUS | Status: DC
  Administered 2023-06-13: 3 [IU] via SUBCUTANEOUS
  Filled 2023-06-12: qty 0.03

## 2023-06-12 MED ORDER — ONDANSETRON HCL 4 MG/2ML IJ SOLN
4.0000 mg | Freq: Once | INTRAMUSCULAR | Status: AC
  Administered 2023-06-12: 4 mg via INTRAVENOUS
  Filled 2023-06-12: qty 2

## 2023-06-12 MED ORDER — METRONIDAZOLE 500 MG/100ML IV SOLN
500.0000 mg | Freq: Once | INTRAVENOUS | Status: AC
  Administered 2023-06-12: 500 mg via INTRAVENOUS
  Filled 2023-06-12: qty 100

## 2023-06-12 MED ORDER — SODIUM CHLORIDE 0.9 % IV SOLN
2.0000 g | Freq: Once | INTRAVENOUS | Status: AC
  Administered 2023-06-12: 2 g via INTRAVENOUS
  Filled 2023-06-12: qty 12.5

## 2023-06-12 MED ORDER — DEXTROSE IN LACTATED RINGERS 5 % IV SOLN: INTRAVENOUS | Status: DC

## 2023-06-12 MED ORDER — LACTATED RINGERS IV SOLN: INTRAVENOUS | Status: DC

## 2023-06-12 MED ORDER — INSULIN ASPART 100 UNIT/ML IJ SOLN
0.0000 [IU] | INTRAMUSCULAR | Status: DC
  Administered 2023-06-13: 6 [IU] via SUBCUTANEOUS

## 2023-06-12 MED ORDER — SODIUM CHLORIDE 0.9 % IV BOLUS
500.0000 mL | Freq: Once | INTRAVENOUS | Status: AC
  Administered 2023-06-12: 500 mL via INTRAVENOUS

## 2023-06-12 MED ORDER — METOCLOPRAMIDE HCL 5 MG/ML IJ SOLN
5.0000 mg | Freq: Once | INTRAMUSCULAR | Status: AC
  Administered 2023-06-13: 5 mg via INTRAVENOUS
  Filled 2023-06-12: qty 2

## 2023-06-12 MED ORDER — LEVETIRACETAM IN NACL 1000 MG/100ML IV SOLN
1000.0000 mg | Freq: Once | INTRAVENOUS | Status: AC
  Administered 2023-06-13: 1000 mg via INTRAVENOUS
  Filled 2023-06-12: qty 100

## 2023-06-12 MED ORDER — SODIUM CHLORIDE 0.9 % IV SOLN: INTRAVENOUS | Status: DC

## 2023-06-12 MED ORDER — INSULIN REGULAR(HUMAN) IN NACL 100-0.9 UT/100ML-% IV SOLN: INTRAVENOUS | Status: DC

## 2023-06-12 MED ORDER — POTASSIUM CHLORIDE 10 MEQ/100ML IV SOLN: 10.0000 meq | INTRAVENOUS | Status: DC

## 2023-06-12 MED ORDER — DEXTROSE 50 % IV SOLN: 0.0000 mL | INTRAVENOUS | Status: DC | PRN

## 2023-06-12 MED ORDER — ONDANSETRON HCL 4 MG/2ML IJ SOLN
4.0000 mg | Freq: Four times a day (QID) | INTRAMUSCULAR | Status: DC | PRN
  Administered 2023-06-14 – 2023-06-16 (×6): 4 mg via INTRAVENOUS
  Filled 2023-06-12 (×6): qty 2

## 2023-06-12 MED ORDER — LORAZEPAM 2 MG/ML IJ SOLN: 1.0000 mg | INTRAMUSCULAR | Status: DC | PRN

## 2023-06-12 NOTE — H&P (Signed)
 NAME:  Ann Robertson, MRN:  [id number], DOB:  1990-11-20, LOS: 0 ADMISSION DATE:  06/12/2023, CONSULTATION DATE:  06/12/23 REFERRING MD:  Curtistine Dawn, MD CHIEF COMPLAINT:  Seizures   History of Present Illness:  33 y.o. female with a history of medical nonadherence.  Given patient's altered mentation at the time of my evaluation history was obtained from the consulting ED provider as well as her electronic medical record. I spoke with her father, Jasma Seevers, by phone. He reports his grandson called him when she started shaking and eyes were rolling into the back of her head. He reports on his arrival she had calmed down. She was talking to him and said her throat was hurting and she wasn't feeling good. He was present when she developed a whole body shaking. He reports her mouth closed and wasn't able to open it. He isn't sure if she was incontinent. He reports she has never had shaking of this nature before that he's aware. He believes her last dialysis session was Friday. He has noticed progressively increasing stomach distension. He is not aware of any sick contacts in her home. He isn't sure if she's compliant with her medication. He reports since her last admission she has continued to complain pain in her throat. He is unaware of any other symptoms or complaints. Patient notably has only seen her pain specialist since her hospitalization per her father's report.  In reviewing the patient's discharge summary from 06/02/2023 patient was plan to continue hemodialysis on Monday, listed, and Friday as well as follow-up with endocrinology.  Patient underwent paracentesis on 12/30 which had a negative Gram stain and patient completed 5 days of empiric antibiotics for spontaneous bacterial peritonitis per report.  Patient's anemia was reportedly stable at the time of discharge as well.  Reflux was noted.  Patient was admitted to hospital with DKA and was treated as such initially.  CT  imaging demonstrated gastroenteritis.  Patient notably had volume overload and this was thought to be the cause for her ascites as well.  At the time of discharge she was continued on labetalol  and a statin.  Patient was also on Lasix  80 mg by mouth daily.  She was discharged on Lantus  3 units subcu daily along with lispro sliding scale insulin .  ED provider reports that on EMS arrival patient had an additional seizure and was given 5 mg of IM Versed .  Patient was started on IV fluid and transported to the emergency department.  In the ED her blood glucose was notably elevated.  An NG tube was placed for decompression.  At patient notably had emesis with placement of NG tube.  She received 1 g of IV Keppra  in the ED as well as 5 mg of IV Reglan .  With concern for possible sepsis patient was given empiric vancomycin  and Flagyl .  Pertinent  Medical History  N/A  Significant Hospital Events: Including procedures, antibiotic start and stop dates in addition to other pertinent events   01/12 - Presented to hospital by EMS >> Admitted by PCCM  Interim History / Subjective:  N/A  Objective   Blood pressure (!) 137/106, pulse (!) 109, temperature (!) 101.2 F (38.4 C), temperature source Rectal, resp. rate (!) 22, SpO2 90%.        Intake/Output Summary (Last 24 hours) at 06/12/2023 2302 Last data filed at 06/12/2023 2253 Gross per 24 hour  Intake 100.17 ml  Output --  Net 100.17 ml   There were no vitals  filed for this visit.  Examination: General:  No acute distress. No family at bedside. Nurse at bedside. Awake.  Peripheral muscle wasting noted. Integument:  Warm & dry. No rash or bruising on exposed skin.  Extremities:  No cyanosis or clubbing.  Lymphatics:  No appreciated cervical or supraclavicular lymphadenopathy. HEENT: Tachy mucus membranes. No oral ulcers. No scleral icterus. NGT just placed. Cardiovascular:  Regular rhythm. Sinus tachycardia on telemetry. No edema or JVD appreciated.  No murmur appreciated. Pulmonary:  Clear and distant breath sounds bilaterally. Symmetric chest wall rise. Mildly tachypneic on nasal cannula. Abdomen: Soft. Protuberant. Hypoactive bowel sounds. No voluntary guarding. Grossly nontender. Musculoskeletal:  Symmetrically decreased muscle bulk with normal tone. No joint deformity or effusion appreciated. Neurological:  Patient evaluated nearly immediately following NG tube placement.  Spontaneously moving extremities.  She does attend to voice and seems to nod to questions intermittently.  Pupils are symmetric and reactive with forward gaze.  No meningismus. Psychiatric: Patient notably altered and will not answer questions or consistently follow commands.  Resolved Hospital Problem list   N/A  Assessment & Plan:  33 y.o. female with known history of multiple medical problems including diabetes mellitus that is poorly controlled as well as in stage renal disease on renal replacement therapy.  Patient's father reports a tonic-clonic seizure at home which is new without any prior history of seizure disorder.  Patient did receive IM Versed  by EMS.  Patient notably altered at the time of my evaluation.  1.  Seizure: New onset.  Multiple potential causes for this including patient's hyponatremia, hyperglycemia, and uncontrolled hypertension.  CT head is pending.  Ordering EEG.  Ordering seizure precautions and IV Ativan  every 15 minutes as needed with ordered to notify E-Link MD.  Patient received IV Keppra  in the emergency department.  Given recurrent nature of seizure I am checking a CK.  I personally contacted the on-call neurologist Dr. CANDIE Jernigan to request a formal consultation.  2.  Acute encephalopathy: Present on arrival.  Multifactorial in etiology with likely some element of postictal state as well as IM Versed .  Ordering serum B12, TSH, and free T4.  CT of the brain and EEG are pending.  Ordering neurochecks every hour.  3.  Hyperglycemia:  Clinical picture seems more consistent with HHNK as there is no apparent acidosis.  Serum osmolality is pending.  Ordering Lantus  3 units subcu every 24 hours.  Ordering sliding scale insulin  using the very low-dose algorithm and monitoring blood glucose with Accu-Cheks every 4 hours.  4.  Transaminitis: Possibly due to passive congestion with uncontrolled hypertension.  Checking CK, amylase, acetaminophen  level, salicylate level, and acute hepatitis panel.  Repeating LFTs with morning labs.  Checking right upper quadrant ultrasound.  5.  Possible bowel obstruction: NG tube will remain on low intermittent suction.  Checking CT imaging of the abdomen and pelvis without contrast stat.  6.  Fever: Question early sepsis with patient's leukopenia.  Ordering blood cultures and catheterized urine specimen with reflex to culture.  Trending cell counts/WBC count daily with CBC.  Holding on further antibiotics until source is identified.  7.  Ascites: Previously underwent paracentesis 12/30.  Previously treated for spontaneous bacterial peritonitis.  No clinical evidence for abdominal pain.  Holding on further antibiotic therapy.  Patient will likely need at least a diagnostic paracentesis pending further workup.  Checking right upper quadrant ultrasound given transaminitis.  8.  ESRD on hemodialysis: Patient reportedly has been adherent to her dialysis sessions.  Repeating right  panel in the morning.  Checking serum magnesium  and phosphorus now.  Holding on further IV fluid.  9.  Pseudohyponatremia: Secondary to hyperglycemia.  Repeating electrolyte panel in the morning with gradual correction of hyperglycemia.  10.  Uncontrolled hypertension: Monitoring vitals per unit protocol.  Goal systolic blood pressure less than 165 mmHg.  Checking CT head without contrast.  If concern for hypertensive encephalopathy remains we will plan on obtaining MRI imaging of the brain.  Ordering labetalol  10 mg IV every 4 hours as  needed systolic blood pressure greater than 165 mmHg.  11.  Anemia and thrombocytopenia: No visible signs of active bleeding.  Hemoglobin seems to be at baseline and platelet count is only slightly worse.  Trending cell counts daily with CBC.  12.  Lactic acidosis: Suspect poor clearance given patient's transaminitis and end-stage renal disease.  Likely generated from repetitive seizure activity.  Continuing to trend lactic acid.  Holding on further IV fluid.  13.  Hyperlipidemia: Holding home statin given n.p.o. status.  14.  CODE STATUS: Full code as per my discussion with the patient's father by phone this evening.  15.  Disposition: Admitting the patient to the ICU for close monitoring given her potential for airway compromise with her altered mentation.  Best Practice (right click and Reselect all SmartList Selections daily)   Diet/type: NPO DVT prophylaxis SCD Pressure ulcer(s): N/A GI prophylaxis: N/A Lines: N/A Foley:  N/A Code Status:  full code Last date of multidisciplinary goals of care discussion [Pending]  Labs   CBC: Recent Labs  Lab 06/12/23 2101 06/12/23 2130 06/12/23 2201  WBC 2.3*  --   --   NEUTROABS 1.3*  --   --   HGB 9.7* 12.9  12.9 10.2*  HCT 34.6* 38.0  38.0 30.0*  MCV 96.9  --   --   PLT 85*  --   --     Basic Metabolic Panel: Recent Labs  Lab 06/12/23 2101 06/12/23 2130 06/12/23 2201  NA 120* 122*  122* 122*  K 3.8 3.9  3.9 4.2  CL 84* 85*  --   CO2 23  --   --   GLUCOSE 1,094* >700*  --   BUN 13 15  --   CREATININE 4.88* 4.90*  --   CALCIUM  7.8*  --   --    GFR: Estimated Creatinine Clearance: 12.6 mL/min (A) (by C-G formula based on SCr of 4.9 mg/dL (H)). Recent Labs  Lab 06/12/23 2101 06/12/23 2130  WBC 2.3*  --   LATICACIDVEN  --  3.4*    Liver Function Tests: Recent Labs  Lab 06/12/23 2101  AST 388*  ALT 86*  ALKPHOS 297*  BILITOT 0.9  PROT 7.9  ALBUMIN  3.2*   Recent Labs  Lab 06/12/23 2101  LIPASE 25    No results for input(s): AMMONIA in the last 168 hours.  ABG    Component Value Date/Time   PHART 7.371 06/12/2023 2201   PCO2ART 49.3 (H) 06/12/2023 2201   PO2ART 210 (H) 06/12/2023 2201   HCO3 28.2 (H) 06/12/2023 2201   TCO2 30 06/12/2023 2201   ACIDBASEDEF 1.0 06/12/2023 2130   O2SAT 100 06/12/2023 2201     Coagulation Profile: No results for input(s): INR, PROTIME in the last 168 hours.  Cardiac Enzymes: No results for input(s): CKTOTAL, CKMB, CKMBINDEX, TROPONINI in the last 168 hours.  HbA1C: HbA1c POC (<> result, manual entry)  Date/Time Value Ref Range Status  09/10/2020 01:35 PM >15 4.0 -  5.6 % Final   Hgb A1c MFr Bld  Date/Time Value Ref Range Status  05/30/2023 01:12 AM >15.5 (H) 4.8 - 5.6 % Final    Comment:    (NOTE)         Prediabetes: 5.7 - 6.4         Diabetes: >6.4         Glycemic control for adults with diabetes: <7.0   02/08/2022 09:41 PM 14.3 (H) 4.8 - 5.6 % Final    Comment:    (NOTE) Pre diabetes:          5.7%-6.4%  Diabetes:              >6.4%  Glycemic control for   <7.0% adults with diabetes     CBG: Recent Labs  Lab 06/12/23 2055  GLUCAP >600*   IMAGING: PORTABLE CXR 06/12/23 (personally reviewed by me): Compared with portable chest x-ray from 05/30/2023.  Slight increase in hazy right basilar opacity with meniscus suggesting a small right pleural effusion.  Low lung volumes.  No left pleural effusion appreciated.  Linear opacity at left lung base persists and could represent focal consolidation or atelectasis.  No new focal mass or opacity.  Cardiomegaly suggested.  Trachea midline.  Mediastinal contour normal.  PORTABLE ABDOMINAL X-RAY 06/12/23 (per radiologist):   IMPRESSION: 1. Multiple centralized loops of small bowel that appear mildly distended with gas at the upper limits of normal. Centralization consistent with known ascites. 2. Developing partial or early small-bowel obstruction not fully  excluded.  Review of Systems:   Unable to obtain a review of systems given patient's altered mental status.   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   Neuropathy    Preeclampsia    STD (sexually transmitted disease)     Surgical History:   Past Surgical History:  Procedure Laterality Date   AV FISTULA PLACEMENT Left 06/19/2021   Procedure: INSERTION OF ARTERIOVENOUS (AV) GORE-TEX GRAFT ARM;  Surgeon: Eliza Lonni RAMAN, MD;  Location: [facility] OR;  Service: Vascular;  Laterality: Left;   CESAREAN SECTION     CESAREAN SECTION N/A 07/05/2013   Procedure: CESAREAN SECTION;  Surgeon: Harland JAYSON Birkenhead, MD;  Location: [facility] ORS;  Service: Obstetrics;  Laterality: N/A;   CESAREAN SECTION N/A 07/25/2014   Procedure: CESAREAN SECTION;  Surgeon: Glenys RAMAN Birk, MD;  Location: [facility] ORS;  Service: Obstetrics;  Laterality: N/A;   CESAREAN SECTION N/A 11/21/2015   Procedure: CESAREAN SECTION;  Surgeon: Glenys RAMAN Birk, MD;  Location: [facility] BIRTHING SUITES;  Service: Obstetrics;  Laterality: N/A;   IR FLUORO GUIDE CV LINE RIGHT  06/16/2021   IR US  GUIDE VASC ACCESS RIGHT  06/16/2021   WISDOM TOOTH EXTRACTION       Social History:   Social History   Socioeconomic History   Marital status: Single    Spouse name: Not on file   Number of children: Not on file   Years of education: Not on file   Highest education level: Not on file  Occupational History   Not on file  Tobacco Use   Smoking status: Some Days    Current packs/day: 0.25    Average packs/day: 0.3 packs/day for 9.0 years (2.3 ttl pk-yrs)    Types: Cigarettes   Smokeless tobacco: Never  Vaping  Use   Vaping status: Not on file  Substance and Sexual Activity   Alcohol  use: Not Currently    Comment: occasional   Drug use: No   Sexual activity: Not Currently    Birth control/protection: None   Other Topics Concern   Not on file  Social History Narrative   Not on file   Social Drivers of Health   Financial Resource Strain: Not on file  Food Insecurity: No Food Insecurity (05/30/2023)   Hunger Vital Sign    Worried About Running Out of Food in the Last Year: Never true    Ran Out of Food in the Last Year: Never true  Transportation Needs: No Transportation Needs (05/30/2023)   PRAPARE - Administrator, Civil Service (Medical): No    Lack of Transportation (Non-Medical): No  Physical Activity: Not on file  Stress: Not on file  Social Connections: Unknown (09/28/2021)   [facility] from [facility], [facility]   Social Network    Social Network: Not on file     Family History:   Family History  Problem Relation Age of Onset   Stroke Mother    Hypertension Mother    Heart disease Mother    Kidney disease Mother    Hyperlipidemia Father    Diabetes Father    Seizures Neg Hx     Allergies Allergies  Allergen Reactions   Dilaudid [Hydromorphone] Itching and Rash     Home Medications  Prior to Admission medications   Medication Sig Start Date End Date Taking? Authorizing Provider  polycarbophil (FIBERCON) 625 MG tablet Take 1 tablet (625 mg total) by mouth 2 (two) times daily. 02/03/23   Marylu Gee, DO  furosemide  (LASIX ) 80 MG tablet Take 80 mg by mouth daily.    [provider]  gabapentin  (NEURONTIN ) 100 MG capsule Take 1 capsule (100 mg total) by mouth 3 (three) times daily as needed (neuropathy). 02/03/23   Marylu Gee, DO  hydrOXYzine  (ATARAX ) 25 MG tablet Take 25 mg by mouth.    [provider]  insulin  glargine (LANTUS  SOLOSTAR) 100 UNIT/ML Solostar Pen Inject 3 Units into the skin daily.    [provider]  insulin  lispro (HUMALOG ) 100 UNIT/ML KwikPen Inject 0-6 Units into the skin 3 (three) times daily with meals. Check Blood Glucose (BG) and inject per scale: BG <150= 0 unit; BG 150-200= 1 unit; BG 201-250= 2  unit; BG 251-300= 3 unit; BG 301-350= 4 unit; BG 351-400= 5 unit; BG >400= 6 unit and Call Primary Care. 02/03/23   Marylu Gee, DO  labetalol  (NORMODYNE ) 100 MG tablet Take 1 tablet (100 mg total) by mouth 2 (two) times daily. 02/03/23   Marylu Gee, DO  lidocaine -prilocaine  (EMLA ) cream Apply 1 Application topically as needed (For port access). 02/03/23   Marylu Gee, DO  lipase/protease/amylase (CREON ) 36000 UNITS CPEP capsule Take 2 capsules (72,000 Units total) by mouth 3 (three) times daily with meals. May also take 1 capsule (36,000 Units total) as needed (with snacks - up to 4 snacks daily). 02/14/23   Marylu Gee, DO  loperamide  (IMODIUM ) 2 MG capsule Take 2 capsules (4 mg total) by mouth as needed for diarrhea or loose stools. Not to exceed 8 caps per day 02/03/23   Marylu Gee, DO  methocarbamol  (ROBAXIN ) 750 MG tablet Take 1 tablet (750 mg total) by mouth every 8 (eight) hours as needed for muscle spasms. 02/03/23   Marylu Gee, DO  omeprazole  (PRILOSEC) 20 MG capsule  Take 1 capsule (20 mg total) by mouth daily as needed (acid reflux/heartburn). 02/03/23   Marylu Gee, DO  ondansetron  (ZOFRAN ) 4 MG tablet Take 1 tablet (4 mg total) by mouth every 8 (eight) hours as needed for nausea or vomiting. 06/02/23   Bryn Bernardino NOVAK, MD  oxyCODONE -acetaminophen  (PERCOCET) 10-325 MG tablet Take 1 tablet by mouth every 6 (six) hours as needed for pain.    [provider]  pantoprazole  (PROTONIX ) 40 MG tablet Take 1 tablet (40 mg total) by mouth daily. 06/02/23   Bryn Bernardino NOVAK, MD  rosuvastatin  (CRESTOR ) 10 MG tablet Take 1 tablet (10 mg total) by mouth daily. 02/04/23   Marylu Gee, DO  Acidophilus Lactobacillus CAPS Take 1 capsule by mouth 3 (three) times daily with meals. 02/03/23   Marylu Gee, DO     Critical care time: N/A     Tonnie BRAVO. Noreen, M.D. [facility] 11:02 PM 06/12/23   Please see Amion for pager details.  From 7A-7P if no response please  call [phone] After hours, please call ELink [phone]

## 2023-06-12 NOTE — ED Provider Notes (Signed)
 [facility] EMERG[facility]Y DE[facility]MENT AT [facility] Provider Note   CSN: [id number] Arrival date & time: 06/12/23  2044     History  Chief Complaint  Patient presents with   Seizures    Ann Robertson is a 33 y.o. female.  33 year old female with history of end-stage renal disease, poor compliance, dialysis dependent, poorly controlled insulin -dependent diabetes presents from home after having complaint initially of shortness of breath.  Patient reportedly had a witnessed seizure prior to EMS arrival.  When they arrived there, patient had an additional seizure for which she was treated with 5 mg of Versed .  CBG was found to be greater than 600.  Started on IV fluids and transported here       Home Medications Prior to Admission medications   Medication Sig Start Date End Date Taking? Authorizing Provider  polycarbophil (FIBERCON) 625 MG tablet Take 1 tablet (625 mg total) by mouth 2 (two) times daily. 02/03/23   Marylu Gee, DO  furosemide  (LASIX ) 80 MG tablet Take 80 mg by mouth daily.    [provider]  gabapentin  (NEURONTIN ) 100 MG capsule Take 1 capsule (100 mg total) by mouth 3 (three) times daily as needed (neuropathy). 02/03/23   Marylu Gee, DO  hydrOXYzine  (ATARAX ) 25 MG tablet Take 25 mg by mouth.    [provider]  insulin  glargine (LANTUS  SOLOSTAR) 100 UNIT/ML Solostar Pen Inject 3 Units into the skin daily.    [provider]  insulin  lispro (HUMALOG ) 100 UNIT/ML KwikPen Inject 0-6 Units into the skin 3 (three) times daily with meals. Check Blood Glucose (BG) and inject per scale: BG <150= 0 unit; BG 150-200= 1 unit; BG 201-250= 2 unit; BG 251-300= 3 unit; BG 301-350= 4 unit; BG 351-400= 5 unit; BG >400= 6 unit and Call Primary Care. 02/03/23   Marylu Gee, DO  labetalol  (NORMODYNE ) 100 MG tablet Take 1 tablet (100 mg total) by mouth 2 (two) times daily. 02/03/23   Marylu Gee, DO  lidocaine -prilocaine  (EMLA ) cream Apply 1  Application topically as needed (For port access). 02/03/23   Marylu Gee, DO  lipase/protease/amylase (CREON ) 36000 UNITS CPEP capsule Take 2 capsules (72,000 Units total) by mouth 3 (three) times daily with meals. May also take 1 capsule (36,000 Units total) as needed (with snacks - up to 4 snacks daily). 02/14/23   Marylu Gee, DO  loperamide  (IMODIUM ) 2 MG capsule Take 2 capsules (4 mg total) by mouth as needed for diarrhea or loose stools. Not to exceed 8 caps per day 02/03/23   Marylu Gee, DO  methocarbamol  (ROBAXIN ) 750 MG tablet Take 1 tablet (750 mg total) by mouth every 8 (eight) hours as needed for muscle spasms. 02/03/23   Marylu Gee, DO  omeprazole  (PRILOSEC) 20 MG capsule Take 1 capsule (20 mg total) by mouth daily as needed (acid reflux/heartburn). 02/03/23   Marylu Gee, DO  ondansetron  (ZOFRAN ) 4 MG tablet Take 1 tablet (4 mg total) by mouth every 8 (eight) hours as needed for nausea or vomiting. 06/02/23   Bryn Bernardino NOVAK, MD  oxyCODONE -acetaminophen  (PERCOCET) 10-325 MG tablet Take 1 tablet by mouth every 6 (six) hours as needed for pain.    [provider]  pantoprazole  (PROTONIX ) 40 MG tablet Take 1 tablet (40 mg total) by mouth daily. 06/02/23   Bryn Bernardino NOVAK, MD  rosuvastatin  (CRESTOR ) 10 MG tablet Take 1 tablet (10 mg total) by mouth daily. 02/04/23   Marylu Gee, DO  Acidophilus Lactobacillus CAPS Take  1 capsule by mouth 3 (three) times daily with meals. 02/03/23   Marylu Gee, DO      Allergies    Dilaudid [hydromorphone]    Review of Systems   Review of Systems  Unable to perform ROS: Mental status change    Physical Exam Updated Vital Signs LMP  (LMP Unknown)  Physical Exam Vitals and nursing note reviewed.  Constitutional:      General: She is not in acute distress.    Appearance: She is underweight. She is not toxic-appearing.  HENT:     Head: Normocephalic and atraumatic.  Eyes:     General: Lids are normal.     Conjunctiva/sclera: Conjunctivae  normal.     Pupils: Pupils are equal, round, and reactive to light.  Neck:     Thyroid : No thyroid  mass.     Trachea: No tracheal deviation.  Cardiovascular:     Rate and Rhythm: Regular rhythm. Tachycardia present.     Heart sounds: Normal heart sounds. No murmur heard.    No gallop.  Pulmonary:     Effort: Pulmonary effort is normal. No respiratory distress.     Breath sounds: Normal breath sounds. No stridor. No decreased breath sounds, wheezing, rhonchi or rales.  Abdominal:     General: There is no distension.     Palpations: Abdomen is soft.     Tenderness: There is no abdominal tenderness. There is no rebound.  Musculoskeletal:        General: No tenderness. Normal range of motion.     Cervical back: Normal range of motion and neck supple.  Lymphadenopathy:     Comments: 3 Plus bilateral lower extremity pitting edema  Skin:    General: Skin is warm and dry.     Findings: No abrasion or rash.  Neurological:     Mental Status: She is lethargic and disoriented.     GCS: GCS eye subscore is 2. GCS verbal subscore is 3. GCS motor subscore is 3.     Motor: Atrophy present. No tremor.     Comments: Withdraws to pain  Psychiatric:        Attention and Perception: She is inattentive.     ED Results / Procedures / Treatments   Labs (all labs ordered are listed, but only abnormal results are displayed) Labs Reviewed  CBG MONITORING, ED - Abnormal; Notable for the following components:      Result Value   Glucose-Capillary >600 (*)    All other components within normal limits  URINALYSIS, W/ REFLEX TO CULTURE (INFECTION SUSPECTED)  COMPREHENSIVE METABOLIC PANEL  CBC WITH DIFFERENTIAL/PLATELET  LIPASE, BLOOD  HCG, SERUM, QUALITATIVE  BETA-HYDROXYBUTYRIC ACID  BETA-HYDROXYBUTYRIC ACID  CBC WITH DIFFERENTIAL/PLATELET  I-STAT CHEM 8, ED  I-STAT CG4 LACTIC ACID, ED  CBG MONITORING, ED  I-STAT VENOUS BLOOD GAS, ED  I-STAT ARTERIAL BLOOD GAS, ED     EKG None  Radiology No results found.  Procedures Procedures    Medications Ordered in ED Medications  sodium chloride  0.9 % bolus 500 mL (has no administration in time range)  0.9 %  sodium chloride  infusion (has no administration in time range)    ED Course/ Medical Decision Making/ A&P                                 Medical Decision Making Amount and/or Complexity of Data Reviewed Labs: ordered. Radiology: ordered.  Risk Prescription drug management.  Patient presented after seizure-like activity at home just prior to arrival.  Patient had received 5 mg of Versed .  Able to protect her airway here.  Temperature 101.2 noted.  Given Tylenol  rectally here.  CBG here read over 600.  IV fluids initiated.  Patient has poor history of compliance with dialysis.  Therefore we will gently hydrate.  Chest x-ray consistent with pneumonia.  Patient had sepsis protocol started and given IV antibiotics.  Lactate elevated here.  Patient abdominal flatplate which shows possible small bowel obstruction.  NG tube placed and copious amounts of fluid came out.  Patient arterial blood gas which shows no evidence of hypercapnia.  Patient is not in DKA at this time as her sugar is over thousand but her anion gap is only 13.  Hydroxybutyrate is normal.  Patient monitored closely and was able to protect her airway.  She did somewhat become more responsive and open her eyes spontaneously.  New onset seizure will order head CT.  Patient is somnolent which I think is likely from the Versed .  Low suspicion for meningitis of her broad-spectrum antibiotics should cover for this.  Will help ventilation at this time.  Discussed with critical care who will come and admit the patient.  CRITICAL CARE Performed by: Curtistine ONEIDA Dawn Total critical care time: 70 minutes Critical care time was exclusive of separately billable procedures and treating other patients. Critical care was necessary to treat or prevent  imminent or life-threatening deterioration. Critical care was time spent personally by me on the following activities: development of treatment plan with patient and/or surrogate as well as nursing, discussions with consultants, evaluation of patient's response to treatment, examination of patient, obtaining history from patient or surrogate, ordering and performing treatments and interventions, ordering and review of laboratory studies, ordering and review of radiographic studies, pulse oximetry and re-evaluation of patient's condition.         Final Clinical Impression(s) / ED Diagnoses Final diagnoses:  None    Rx / DC Orders ED Discharge Orders     None         Dawn Curtistine, MD 06/12/23 2256

## 2023-06-12 NOTE — Progress Notes (Signed)
 Pt being followed by [facility] for Sepsis protocol.

## 2023-06-12 NOTE — ED Triage Notes (Signed)
 Pt is coming from home, medic was called out for seizures, medic witnessed one seizure and gave her 5mg  versed  for the seizure. Her blood sugar read high on the sugar monitor. She is also a dialysis patient. There was some reported complaints of shortness of breath. There is swelling noted in all extremities, She comes in with altered mental due to the versed  given.   Medic vitals   230/120 116hr 100% 8l 24rr shallow High bgl 12 capnography   nacl 5mg  versed  im

## 2023-06-13 ENCOUNTER — Inpatient Hospital Stay (HOSPITAL_COMMUNITY): Payer: Medicare HMO

## 2023-06-13 ENCOUNTER — Other Ambulatory Visit: Payer: Self-pay

## 2023-06-13 ENCOUNTER — Other Ambulatory Visit (HOSPITAL_COMMUNITY): Payer: Medicare HMO

## 2023-06-13 DIAGNOSIS — E871 Hypo-osmolality and hyponatremia: Secondary | ICD-10-CM

## 2023-06-13 DIAGNOSIS — D631 Anemia in chronic kidney disease: Secondary | ICD-10-CM

## 2023-06-13 DIAGNOSIS — Z992 Dependence on renal dialysis: Secondary | ICD-10-CM

## 2023-06-13 DIAGNOSIS — R739 Hyperglycemia, unspecified: Secondary | ICD-10-CM

## 2023-06-13 DIAGNOSIS — N186 End stage renal disease: Secondary | ICD-10-CM

## 2023-06-13 DIAGNOSIS — R601 Generalized edema: Secondary | ICD-10-CM

## 2023-06-13 DIAGNOSIS — R569 Unspecified convulsions: Secondary | ICD-10-CM | POA: Diagnosis not present

## 2023-06-13 LAB — AMYLASE: Amylase: 27 U/L — ABNORMAL LOW (ref 28–100)

## 2023-06-13 LAB — CBC WITH DIFFERENTIAL/PLATELET
Abs Immature Granulocytes: 0.03 K/uL (ref 0.00–0.07)
Basophils Absolute: 0 K/uL (ref 0.0–0.1)
Basophils Relative: 0 %
Eosinophils Absolute: 0.1 K/uL (ref 0.0–0.5)
Eosinophils Relative: 1 %
HCT: 27.2 % — ABNORMAL LOW (ref 36.0–46.0)
Hemoglobin: 8.5 g/dL — ABNORMAL LOW (ref 12.0–15.0)
Immature Granulocytes: 1 %
Lymphocytes Relative: 11 %
Lymphs Abs: 0.7 K/uL (ref 0.7–4.0)
MCH: 27.3 pg (ref 26.0–34.0)
MCHC: 31.3 g/dL (ref 30.0–36.0)
MCV: 87.5 fL (ref 80.0–100.0)
Monocytes Absolute: 0.1 K/uL (ref 0.1–1.0)
Monocytes Relative: 2 %
Neutro Abs: 5.5 K/uL (ref 1.7–7.7)
Neutrophils Relative %: 85 %
Platelets: 75 K/uL — ABNORMAL LOW (ref 150–400)
RBC: 3.11 MIL/uL — ABNORMAL LOW (ref 3.87–5.11)
RDW: 13.5 % (ref 11.5–15.5)
WBC: 6.5 K/uL (ref 4.0–10.5)
nRBC: 0 % (ref 0.0–0.2)

## 2023-06-13 LAB — GLUCOSE, CAPILLARY
Glucose-Capillary: >600 mg/dL (ref 70–99)
Glucose-Capillary: >600 mg/dL (ref 70–99)
Glucose-Capillary: >600 mg/dL (ref 70–99)
Glucose-Capillary: >600 mg/dL (ref 70–99)
Glucose-Capillary: >600 mg/dL (ref 70–99)
Glucose-Capillary: 538 mg/dL (ref 70–99)
Glucose-Capillary: 543 mg/dL (ref 70–99)
Glucose-Capillary: 483 mg/dL — ABNORMAL HIGH (ref 70–99)
Glucose-Capillary: 498 mg/dL — ABNORMAL HIGH (ref 70–99)
Glucose-Capillary: 425 mg/dL — ABNORMAL HIGH (ref 70–99)
Glucose-Capillary: 428 mg/dL — ABNORMAL HIGH (ref 70–99)
Glucose-Capillary: 356 mg/dL — ABNORMAL HIGH (ref 70–99)
Glucose-Capillary: 326 mg/dL — ABNORMAL HIGH (ref 70–99)
Glucose-Capillary: 277 mg/dL — ABNORMAL HIGH (ref 70–99)
Glucose-Capillary: 233 mg/dL — ABNORMAL HIGH (ref 70–99)
Glucose-Capillary: 152 mg/dL — ABNORMAL HIGH (ref 70–99)
Glucose-Capillary: 126 mg/dL — ABNORMAL HIGH (ref 70–99)
Glucose-Capillary: 84 mg/dL (ref 70–99)
Glucose-Capillary: 71 mg/dL (ref 70–99)
Glucose-Capillary: 97 mg/dL (ref 70–99)
Glucose-Capillary: 120 mg/dL — ABNORMAL HIGH (ref 70–99)
Glucose-Capillary: 14 mg/dL — CL (ref 70–99)
Glucose-Capillary: 18 mg/dL — CL (ref 70–99)
Glucose-Capillary: <10 mg/dL — CL (ref 70–99)
Glucose-Capillary: <10 mg/dL — CL (ref 70–99)

## 2023-06-13 LAB — BASIC METABOLIC PANEL
Anion gap: 16 — ABNORMAL HIGH (ref 5–15)
Anion gap: 15 (ref 5–15)
Anion gap: 9 (ref 5–15)
BUN: 14 mg/dL (ref 6–20)
BUN: 15 mg/dL (ref 6–20)
BUN: 16 mg/dL (ref 6–20)
CO2: 22 mmol/L (ref 22–32)
CO2: 22 mmol/L (ref 22–32)
CO2: 24 mmol/L (ref 22–32)
Calcium: 7.5 mg/dL — ABNORMAL LOW (ref 8.9–10.3)
Calcium: 7.8 mg/dL — ABNORMAL LOW (ref 8.9–10.3)
Calcium: 7.4 mg/dL — ABNORMAL LOW (ref 8.9–10.3)
Chloride: 89 mmol/L — ABNORMAL LOW (ref 98–111)
Chloride: 88 mmol/L — ABNORMAL LOW (ref 98–111)
Chloride: 90 mmol/L — ABNORMAL LOW (ref 98–111)
Creatinine, Ser: 5 mg/dL — ABNORMAL HIGH (ref 0.44–1.00)
Creatinine, Ser: 5.12 mg/dL — ABNORMAL HIGH (ref 0.44–1.00)
Creatinine, Ser: 5.27 mg/dL — ABNORMAL HIGH (ref 0.44–1.00)
GFR, Estimated: 11 mL/min — ABNORMAL LOW (ref >60)
GFR, Estimated: 11 mL/min — ABNORMAL LOW (ref >60)
GFR, Estimated: 10 mL/min — ABNORMAL LOW (ref >60)
Glucose, Bld: 605 mg/dL (ref 70–99)
Glucose, Bld: 369 mg/dL — ABNORMAL HIGH (ref 70–99)
Glucose, Bld: 32 mg/dL — CL (ref 70–99)
Potassium: 3.2 mmol/L — ABNORMAL LOW (ref 3.5–5.1)
Potassium: 2.9 mmol/L — ABNORMAL LOW (ref 3.5–5.1)
Potassium: 2.7 mmol/L — CL (ref 3.5–5.1)
Sodium: 127 mmol/L — ABNORMAL LOW (ref 135–145)
Sodium: 125 mmol/L — ABNORMAL LOW (ref 135–145)
Sodium: 123 mmol/L — ABNORMAL LOW (ref 135–145)

## 2023-06-13 LAB — HEPATIC FUNCTION PANEL
ALT: 56 U/L — ABNORMAL HIGH (ref 0–44)
AST: 139 U/L — ABNORMAL HIGH (ref 15–41)
Albumin: 2.7 g/dL — ABNORMAL LOW (ref 3.5–5.0)
Alkaline Phosphatase: 217 U/L — ABNORMAL HIGH (ref 38–126)
Bilirubin, Direct: 0.3 mg/dL — ABNORMAL HIGH (ref 0.0–0.2)
Indirect Bilirubin: 0.7 mg/dL (ref 0.3–0.9)
Total Bilirubin: 1 mg/dL (ref 0.0–1.2)
Total Protein: 6.8 g/dL (ref 6.5–8.1)

## 2023-06-13 LAB — LACTIC ACID, PLASMA
Lactic Acid, Venous: 2.9 mmol/L (ref 0.5–1.9)
Lactic Acid, Venous: 4.3 mmol/L (ref 0.5–1.9)
Lactic Acid, Venous: 4 mmol/L (ref 0.5–1.9)

## 2023-06-13 LAB — OSMOLALITY: Osmolality: 313 mosm/kg — ABNORMAL HIGH (ref 275–295)

## 2023-06-13 LAB — PHOSPHORUS
Phosphorus: 2.5 mg/dL (ref 2.5–4.6)
Phosphorus: 2.2 mg/dL — ABNORMAL LOW (ref 2.5–4.6)

## 2023-06-13 LAB — CBG MONITORING, ED: Glucose-Capillary: >600 mg/dL (ref 70–99)

## 2023-06-13 LAB — ACETAMINOPHEN LEVEL: Acetaminophen (Tylenol), Serum: <10 ug/mL — ABNORMAL LOW (ref 10–30)

## 2023-06-13 LAB — MRSA NEXT GEN BY PCR, NASAL: MRSA by PCR Next Gen: NOT DETECTED

## 2023-06-13 LAB — TYPE AND SCREEN
ABO/RH(D): O POS
Antibody Screen: NEGATIVE

## 2023-06-13 LAB — HEPATITIS PANEL, ACUTE
HCV Ab: NONREACTIVE
Hep A IgM: NONREACTIVE
Hep B C IgM: NONREACTIVE
Hepatitis B Surface Ag: NONREACTIVE

## 2023-06-13 LAB — MAGNESIUM
Magnesium: 1.6 mg/dL — ABNORMAL LOW (ref 1.7–2.4)
Magnesium: 1.6 mg/dL — ABNORMAL LOW (ref 1.7–2.4)

## 2023-06-13 LAB — HIV ANTIBODY (ROUTINE TESTING W REFLEX): HIV Screen 4th Generation wRfx: NONREACTIVE

## 2023-06-13 LAB — HEPATITIS B SURFACE ANTIGEN: Hepatitis B Surface Ag: NONREACTIVE

## 2023-06-13 LAB — BETA-HYDROXYBUTYRIC ACID
Beta-Hydroxybutyric Acid: <0.05 mmol/L — ABNORMAL LOW (ref 0.05–0.27)
Beta-Hydroxybutyric Acid: <0.05 mmol/L — ABNORMAL LOW (ref 0.05–0.27)

## 2023-06-13 LAB — VITAMIN B12: Vitamin B-12: 1304 pg/mL — ABNORMAL HIGH (ref 180–914)

## 2023-06-13 LAB — CK: Total CK: 45 U/L (ref 38–234)

## 2023-06-13 LAB — TSH: TSH: 15.011 u[IU]/mL — ABNORMAL HIGH (ref 0.350–4.500)

## 2023-06-13 LAB — SALICYLATE LEVEL: Salicylate Lvl: <7 mg/dL — ABNORMAL LOW (ref 7.0–30.0)

## 2023-06-13 LAB — T4, FREE: Free T4: 0.83 ng/dL (ref 0.61–1.12)

## 2023-06-13 MED ORDER — DIPHENHYDRAMINE HCL 50 MG/ML IJ SOLN
12.5000 mg | Freq: Four times a day (QID) | INTRAMUSCULAR | Status: DC | PRN
  Administered 2023-06-13: 12.5 mg via INTRAVENOUS
  Filled 2023-06-13: qty 1

## 2023-06-13 MED ORDER — INSULIN GLARGINE-YFGN 100 UNIT/ML ~~LOC~~ SOLN
5.0000 [IU] | Freq: Two times a day (BID) | SUBCUTANEOUS | Status: DC
Start: 2023-06-13 — End: 2023-06-13
  Administered 2023-06-13: 5 [IU] via SUBCUTANEOUS
  Filled 2023-06-13 (×2): qty 0.05

## 2023-06-13 MED ORDER — DEXTROSE 50 % IV SOLN
0.0000 mL | INTRAVENOUS | Status: DC | PRN
  Administered 2023-06-13: 25 mL via INTRAVENOUS
  Administered 2023-06-13 – 2023-06-14 (×2): 50 mL via INTRAVENOUS
  Administered 2023-06-14: 25 mL via INTRAVENOUS
  Administered 2023-06-14: 50 mL via INTRAVENOUS
  Filled 2023-06-13 (×5): qty 50

## 2023-06-13 MED ORDER — CHLORHEXIDINE GLUCONATE CLOTH 2 % EX PADS
6.0000 | MEDICATED_PAD | Freq: Every day | CUTANEOUS | Status: DC
  Administered 2023-06-13 – 2023-06-17 (×5): 6 via TOPICAL

## 2023-06-13 MED ORDER — LORAZEPAM 2 MG/ML IJ SOLN: 1.0000 mg | INTRAMUSCULAR | Status: DC | PRN

## 2023-06-13 MED ORDER — INSULIN ASPART 100 UNIT/ML IJ SOLN: 1.0000 [IU] | INTRAMUSCULAR | Status: DC

## 2023-06-13 MED ORDER — DEXTROSE 10 % IV SOLN
INTRAVENOUS | Status: DC
  Filled 2023-06-13 (×3): qty 1000

## 2023-06-13 MED ORDER — HEPARIN SODIUM (PORCINE) 5000 UNIT/ML IJ SOLN
5000.0000 [IU] | Freq: Two times a day (BID) | INTRAMUSCULAR | Status: DC
  Administered 2023-06-13: 5000 [IU] via SUBCUTANEOUS
  Filled 2023-06-13 (×8): qty 1

## 2023-06-13 MED ORDER — LABETALOL HCL 5 MG/ML IV SOLN
10.0000 mg | INTRAVENOUS | Status: DC | PRN
  Administered 2023-06-13 (×2): 10 mg via INTRAVENOUS
  Filled 2023-06-13 (×3): qty 4

## 2023-06-13 MED ORDER — HYDRALAZINE HCL 20 MG/ML IJ SOLN
10.0000 mg | Freq: Four times a day (QID) | INTRAMUSCULAR | Status: DC | PRN
  Administered 2023-06-13: 10 mg via INTRAVENOUS
  Filled 2023-06-13: qty 1

## 2023-06-13 MED ORDER — INSULIN GLARGINE-YFGN 100 UNIT/ML ~~LOC~~ SOLN: 5.0000 [IU] | Freq: Every day | SUBCUTANEOUS | Status: DC

## 2023-06-13 MED ORDER — AMLODIPINE BESYLATE 5 MG PO TABS
5.0000 mg | ORAL_TABLET | Freq: Every day | ORAL | Status: DC
  Administered 2023-06-13 – 2023-06-15 (×2): 5 mg via ORAL
  Filled 2023-06-13 (×3): qty 1

## 2023-06-13 MED ORDER — INSULIN GLARGINE-YFGN 100 UNIT/ML ~~LOC~~ SOLN
5.0000 [IU] | Freq: Two times a day (BID) | SUBCUTANEOUS | Status: DC
  Filled 2023-06-13: qty 0.05

## 2023-06-13 MED ORDER — OXYCODONE HCL 5 MG PO TABS
5.0000 mg | ORAL_TABLET | Freq: Four times a day (QID) | ORAL | Status: DC | PRN
Start: 2023-06-13 — End: 2023-06-17
  Administered 2023-06-13: 10 mg via ORAL
  Administered 2023-06-14: 5 mg via ORAL
  Administered 2023-06-14: 10 mg via ORAL
  Administered 2023-06-14: 5 mg via ORAL
  Administered 2023-06-15 – 2023-06-17 (×6): 10 mg via ORAL
  Filled 2023-06-13 (×8): qty 2
  Filled 2023-06-13: qty 1
  Filled 2023-06-13: qty 2

## 2023-06-13 MED ORDER — INSULIN REGULAR(HUMAN) IN NACL 100-0.9 UT/100ML-% IV SOLN
INTRAVENOUS | Status: DC
  Administered 2023-06-13: 4.4 [IU]/h via INTRAVENOUS
  Filled 2023-06-13: qty 100

## 2023-06-13 MED ORDER — LABETALOL HCL 200 MG PO TABS
100.0000 mg | ORAL_TABLET | Freq: Two times a day (BID) | ORAL | Status: DC
  Administered 2023-06-13 – 2023-06-17 (×6): 100 mg via ORAL
  Filled 2023-06-13 (×7): qty 1

## 2023-06-13 MED ORDER — ROSUVASTATIN CALCIUM 5 MG PO TABS
10.0000 mg | ORAL_TABLET | Freq: Every day | ORAL | Status: DC
  Administered 2023-06-13 – 2023-06-17 (×3): 10 mg via ORAL
  Filled 2023-06-13 (×4): qty 2

## 2023-06-13 MED ORDER — LEVOTHYROXINE SODIUM 25 MCG PO TABS
25.0000 ug | ORAL_TABLET | Freq: Every day | ORAL | Status: DC
  Administered 2023-06-13: 25 ug via ORAL
  Filled 2023-06-13 (×4): qty 1

## 2023-06-13 MED ORDER — POTASSIUM CHLORIDE 10 MEQ/100ML IV SOLN
10.0000 meq | INTRAVENOUS | Status: AC
  Administered 2023-06-13 – 2023-06-14 (×6): 10 meq via INTRAVENOUS
  Filled 2023-06-13 (×6): qty 100

## 2023-06-13 MED ORDER — DEXTROSE 50 % IV SOLN
25.0000 g | INTRAVENOUS | Status: AC
  Administered 2023-06-13: 25 g via INTRAVENOUS

## 2023-06-13 NOTE — Progress Notes (Signed)
 [facility] Physician-Brief Progress Note Patient Name: Lylie Blacklock DOB: Sep 26, 1990 MRN: [id number]   Date of Service  06/13/2023  HPI/Events of Note  6 F history DM, ESRD on HD via LUE fistula, ascites with recent SBP. She was brought in this time due to AMS, seizure activities, nausea, vomiting. BP was 230/120, glucose 1094, corrected Na 136  e[facility] Interventions  Glucose persistently > 600 will start insulin  drip Blood cultures to be drawn. Antibiotics given CT head pending Seizure precautions Neurology assessment pending     Intervention Category Evaluation Type: New Patient Evaluation  Damien ONEIDA Grout 06/13/2023, 1:32 AM

## 2023-06-13 NOTE — ED Notes (Signed)
 PT had severe vomiting and was given zofran and reglan.PT was cleaned up and PT pulled out her NG tube.Pts temperature has come down significantly.

## 2023-06-13 NOTE — ED Notes (Signed)
 ED TO INPATIENT HANDOFF REPORT  ED Nurse Name and Phone #: Lorenza [phone]  S Name/Age/Gender Ann Robertson 33 y.o. female Room/Bed: 029C/029C  Code Status   Code Status: Full Code  Home/SNF/Other Home Patient oriented to: self, place, time, and situation Is this baseline? Yes   Triage Complete: Triage complete  Chief Complaint Seizure [facility]) [R56.9]  Triage Note Pt is coming from home, medic was called out for seizures, medic witnessed one seizure and gave her 5mg  versed  for the seizure. Her blood sugar read high on the sugar monitor. She is also a dialysis patient. There was some reported complaints of shortness of breath. There is swelling noted in all extremities, She comes in with altered mental due to the versed  given.   Medic vitals   230/120 116hr 100% 8l 24rr shallow High bgl 12 capnography   nacl 5mg  versed  im   Allergies Allergies  Allergen Reactions   Dilaudid [Hydromorphone] Itching and Rash    Level of Care/Admitting Diagnosis ED Disposition     ED Disposition  Admit   Condition  --   Comment  Hospital Area: [facility] [[id number]]  Level of Care: ICU [6]  May admit patient to [facility] or [facility] if equivalent level of care is available:: Yes  Covid Evaluation: Asymptomatic - no recent exposure (last 10 days) testing not required  Diagnosis: Seizure [facility]) [[id number]]  Admitting Physician: NOREEN TONNIE BRAVO [[id number]]  Attending Physician: NOREEN TONNIE BRAVO [[id number]]  Certification:: I certify this patient will need inpatient services for at least 2 midnights  Expected Medical Readiness: 06/12/2023          B Medical/Surgery History 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   Neuropathy    Preeclampsia    STD (sexually transmitted disease)    Past Surgical History:   Procedure Laterality Date   AV FISTULA PLACEMENT Left 06/19/2021   Procedure: INSERTION OF ARTERIOVENOUS (AV) GORE-TEX GRAFT ARM;  Surgeon: Eliza Lonni RAMAN, MD;  Location: [facility] OR;  Service: Vascular;  Laterality: Left;   CESAREAN SECTION     CESAREAN SECTION N/A 07/05/2013   Procedure: CESAREAN SECTION;  Surgeon: Harland JAYSON Birkenhead, MD;  Location: [facility] ORS;  Service: Obstetrics;  Laterality: N/A;   CESAREAN SECTION N/A 07/25/2014   Procedure: CESAREAN SECTION;  Surgeon: Glenys RAMAN Birk, MD;  Location: [facility] ORS;  Service: Obstetrics;  Laterality: N/A;   CESAREAN SECTION N/A 11/21/2015   Procedure: CESAREAN SECTION;  Surgeon: Glenys RAMAN Birk, MD;  Location: [facility] BIRTHING SUITES;  Service: Obstetrics;  Laterality: N/A;   IR FLUORO GUIDE CV LINE RIGHT  06/16/2021   IR US  GUIDE VASC ACCESS RIGHT  06/16/2021   WISDOM TOOTH EXTRACTION       A IV Location/Drains/Wounds Patient Lines/Drains/Airways Status     Active Line/Drains/Airways     Name Placement date Placement time Site Days   Peripheral IV 06/12/23 20 G 1.88 Anterior;Right;Upper Arm 06/12/23  2124  Arm  1   Peripheral IV 06/12/23 22 G Anterior;Right Forearm 06/12/23  2130  Forearm  1   Fistula / Graft Left Upper arm Arteriovenous vein graft 06/19/21  0850  Upper arm  724   Pressure Injury 06/04/22 Ischial tuberosity Right Deep Tissue Pressure Injury - Purple or maroon localized area of discolored intact skin or blood-filled  blister due to damage of underlying soft tissue from pressure and/or shear. 06/04/22  1340  -- 374            Intake/Output Last 24 hours  Intake/Output Summary (Last 24 hours) at 06/13/2023 0012 Last data filed at 06/13/2023 0011 Gross per 24 hour  Intake 900.78 ml  Output --  Net 900.78 ml    Labs/Imaging Results for orders placed or performed during the hospital encounter of 06/12/23 (from the past 48 hours)  CBG monitoring, ED     Status: Abnormal   Collection Time: 06/12/23  8:55 PM  Result Value Ref Range    Glucose-Capillary >600 (HH) 70 - 99 mg/dL    Comment: Glucose reference range applies only to samples taken after fasting for at least 8 hours.  Comprehensive metabolic panel     Status: Abnormal   Collection Time: 06/12/23  9:01 PM  Result Value Ref Range   Sodium 120 (L) 135 - 145 mmol/L   Potassium 3.8 3.5 - 5.1 mmol/L   Chloride 84 (L) 98 - 111 mmol/L   CO2 23 22 - 32 mmol/L   Glucose, Bld 1,094 (HH) 70 - 99 mg/dL    Comment: CRITICAL RESULT CALLED TO, READ BACK BY AND VERIFIED WITH IVAR CONE RN [phone] 2222 M. ALAMANO Glucose reference range applies only to samples taken after fasting for at least 8 hours.    BUN 13 6 - 20 mg/dL   Creatinine, Ser 5.11 (H) 0.44 - 1.00 mg/dL   Calcium  7.8 (L) 8.9 - 10.3 mg/dL   Total Protein 7.9 6.5 - 8.1 g/dL   Albumin  3.2 (L) 3.5 - 5.0 g/dL   AST 611 (H) 15 - 41 U/L   ALT 86 (H) 0 - 44 U/L   Alkaline Phosphatase 297 (H) 38 - 126 U/L   Total Bilirubin 0.9 0.0 - 1.2 mg/dL   GFR, Estimated 11 (L) >60 mL/min    Comment: (NOTE) Calculated using the CKD-EPI Creatinine Equation (2021)    Anion gap 13 5 - 15    Comment: Performed at [facility], [address] [place], [place] [address]  CBC with Differential/Platelet     Status: Abnormal   Collection Time: 06/12/23  9:01 PM  Result Value Ref Range   WBC 2.3 (L) 4.0 - 10.5 K/uL   RBC 3.57 (L) 3.87 - 5.11 MIL/uL   Hemoglobin 9.7 (L) 12.0 - 15.0 g/dL   HCT 65.3 (L) 63.9 - 53.9 %   MCV 96.9 80.0 - 100.0 fL   MCH 27.2 26.0 - 34.0 pg   MCHC 28.0 (L) 30.0 - 36.0 g/dL   RDW 85.4 88.4 - 84.4 %   Platelets 85 (L) 150 - 400 K/uL    Comment: Immature Platelet Fraction may be clinically indicated, consider ordering this additional test [id number] REPEATED TO VERIFY PLATELET COUNT CONFIRMED BY SMEAR    nRBC 0.0 0.0 - 0.2 %   Neutrophils Relative % 55 %   Neutro Abs 1.3 (L) 1.7 - 7.7 K/uL   Lymphocytes Relative 40 %   Lymphs Abs 0.9 0.7 - 4.0 K/uL   Monocytes Relative 4 %   Monocytes  Absolute 0.1 0.1 - 1.0 K/uL   Eosinophils Relative 1 %   Eosinophils Absolute 0.0 0.0 - 0.5 K/uL   Basophils Relative 0 %   Basophils Absolute 0.0 0.0 - 0.1 K/uL   Immature Granulocytes 0 %   Abs Immature Granulocytes 0.00 0.00 - 0.07 K/uL  Comment: Performed at [facility], [address] [place], [place] [address]  Lipase, blood     Status: None   Collection Time: 06/12/23  9:01 PM  Result Value Ref Range   Lipase 25 11 - 51 U/L    Comment: Performed at [facility], [address] [place], [place] [address]  hCG, serum, qualitative     Status: None   Collection Time: 06/12/23  9:01 PM  Result Value Ref Range   Preg, Serum NEGATIVE NEGATIVE    Comment:        THE SENSITIVITY OF THIS METHODOLOGY IS >10 mIU/mL. Performed at [facility], [address], [place], [place] [address]   Beta-hydroxybutyric acid     Status: Abnormal   Collection Time: 06/12/23  9:02 PM  Result Value Ref Range   Beta-Hydroxybutyric Acid <0.05 (L) 0.05 - 0.27 mmol/L    Comment: Performed at [facility], [address], [place], [place] [address]  I-stat chem 8, ed     Status: Abnormal   Collection Time: 06/12/23  9:30 PM  Result Value Ref Range   Sodium 122 (L) 135 - 145 mmol/L   Potassium 3.9 3.5 - 5.1 mmol/L   Chloride 85 (L) 98 - 111 mmol/L   BUN 15 6 - 20 mg/dL   Creatinine, Ser 5.09 (H) 0.44 - 1.00 mg/dL   Glucose, Bld >299 (HH) 70 - 99 mg/dL    Comment: Glucose reference range applies only to samples taken after fasting for at least 8 hours.   Calcium , Ion 0.95 (L) 1.15 - 1.40 mmol/L   TCO2 23 22 - 32 mmol/L   Hemoglobin 12.9 12.0 - 15.0 g/dL   HCT 61.9 63.9 - 53.9 %   Comment NOTIFIED PHYSICIAN   I-Stat CG4 Lactic Acid     Status: Abnormal   Collection Time: 06/12/23  9:30 PM  Result Value Ref Range   Lactic Acid, Venous 3.4 (HH) 0.5 - 1.9 mmol/L   Comment NOTIFIED PHYSICIAN   I-Stat Venous Blood Gas, ED     Status: Abnormal   Collection Time: 06/12/23   9:30 PM  Result Value Ref Range   pH, Ven 7.337 7.25 - 7.43   pCO2, Ven 47.5 44 - 60 mmHg   pO2, Ven 56 (H) 32 - 45 mmHg   Bicarbonate 25.5 20.0 - 28.0 mmol/L   TCO2 27 22 - 32 mmol/L   O2 Saturation 86 %   Acid-base deficit 1.0 0.0 - 2.0 mmol/L   Sodium 122 (L) 135 - 145 mmol/L   Potassium 3.9 3.5 - 5.1 mmol/L   Calcium , Ion 0.94 (L) 1.15 - 1.40 mmol/L   HCT 38.0 36.0 - 46.0 %   Hemoglobin 12.9 12.0 - 15.0 g/dL   Sample type VENOUS   I-Stat arterial blood gas, ED ([facility], [facility], [facility])     Status: Abnormal   Collection Time: 06/12/23 10:01 PM  Result Value Ref Range   pH, Arterial 7.371 7.35 - 7.45   pCO2 arterial 49.3 (H) 32 - 48 mmHg   pO2, Arterial 210 (H) 83 - 108 mmHg   Bicarbonate 28.2 (H) 20.0 - 28.0 mmol/L   TCO2 30 22 - 32 mmol/L   O2 Saturation 100 %   Acid-Base Excess 3.0 (H) 0.0 - 2.0 mmol/L   Sodium 122 (L) 135 - 145 mmol/L   Potassium 4.2 3.5 - 5.1 mmol/L   Calcium , Ion 1.09 (L) 1.15 - 1.40 mmol/L  HCT 30.0 (L) 36.0 - 46.0 %   Hemoglobin 10.2 (L) 12.0 - 15.0 g/dL   Patient temperature 898.9 F    Collection site RADIAL, ALLEN'S TEST ACCEPTABLE    Drawn by Operator    Sample type ARTERIAL    DG Abd Portable 1V Result Date: 06/12/2023 CLINICAL DATA:  [id number] Abdominal pain [id number] EXAM: PORTABLE ABDOMEN - 1 VIEW COMPARISON:  CT abdomen pelvis 05/30/2023 FINDINGS: Multiple centralized loops of small bowel that appear mildly distended with gas at the upper limits of normal. The stomach is filled with ingested material. No radio-opaque calculi or other significant radiographic abnormality are seen. IMPRESSION: 1. Multiple centralized loops of small bowel that appear mildly distended with gas at the upper limits of normal. Centralization consistent with known ascites. 2. Developing partial or early small-bowel obstruction not fully excluded. Electronically Signed   By: Morgane  Naveau M.D.   On: 06/12/2023 21:50   DG Chest Port 1 View Result Date: 06/12/2023 CLINICAL DATA:   sob EXAM: PORTABLE CHEST 1 VIEW COMPARISON:  Chest x-ray 05/30/2023 FINDINGS: The heart and mediastinal contours are within normal limits. Low lung volumes with suggestion of patchy airspace opacities. No pulmonary edema. Stable trace right pleural effusion. Interval development of a trace left pleural effusion. Right greater than left. No pneumothorax. No acute osseous abnormality. IMPRESSION: Low lung volumes with suggestion of developing multifocal pneumonia and bilateral trace pleural effusions, right greater than left. Consider follow-up with PA and lateral view the chest with improved inspiratory effort for better evaluation of the lungs. Electronically Signed   By: Morgane  Naveau M.D.   On: 06/12/2023 21:48    Pending Labs Unresulted Labs (From admission, onward)     Start     Ordered   06/13/23 0500  Basic metabolic panel  Tomorrow morning,   R        06/12/23 2356   06/13/23 0500  Magnesium   Tomorrow morning,   R        06/12/23 2356   06/13/23 0500  Phosphorus  Tomorrow morning,   R        06/12/23 2356   06/13/23 0500  CBC with Differential/Platelet  Tomorrow morning,   R        06/12/23 2356   06/13/23 0005  Culture, blood (Routine X 2) w Reflex to ID Panel  BLOOD CULTURE X 2,   R (with TIMED occurrences)      06/13/23 0004   06/13/23 0005  Magnesium   Once,   R        06/13/23 0004   06/13/23 0005  Phosphorus  Once,   R        06/13/23 0004   06/12/23 2359  Hepatitis panel, acute  ONCE - URGENT,   URGENT        06/12/23 2358   06/12/23 2359  CK  ONCE - URGENT,   URGENT        06/12/23 2358   06/12/23 2359  Amylase  ONCE - URGENT,   URGENT        06/12/23 2358   06/12/23 2357  Lactic acid, plasma  (Lactic Acid)  STAT Now then every 3 hours,   R (with STAT occurrences)      06/12/23 2356   06/12/23 2355  Type and screen If need to transfuse blood products please use the blood administration order set  ONCE - STAT,   STAT       Comments: If need to transfuse blood products  please use the blood administration order set    06/12/23 2356   06/12/23 2348  HIV Antibody (routine testing w rflx)  (HIV Antibody (Routine testing w reflex) panel)  Once,   R        06/12/23 2356   06/12/23 2224  Osmolality  (Hyperglycemic Hyperosmolar State (HHS))  Once,   URGENT        06/12/23 2224   06/12/23 2155  Resp panel by RT-PCR (RSV, Flu A&B, Covid) Anterior Nasal Swab  Once,   URGENT        06/12/23 2154   06/12/23 2151  Culture, blood (single)  (Undifferentiated -> Now sepsis confirmed (treatment and sepsis specific nursing orders))  ONCE - STAT,   STAT        06/12/23 2151   06/12/23 2102  Beta-hydroxybutyric acid  (Diabetes Ketoacidosis (DKA))  Now then every 8 hours,   STAT (with URGENT occurrences)      06/12/23 2102   06/12/23 2102  CBC with Differential (PNL)  (Diabetes Ketoacidosis (DKA))  ONCE - STAT,   STAT        06/12/23 2102   06/12/23 2101  Urinalysis, w/ Reflex to Culture (Infection Suspected) -Urine, Clean Catch  Once,   URGENT       Question:  Specimen Source  Answer:  Urine, Clean Catch   06/12/23 2102            Vitals/Pain Today's Vitals   06/12/23 2122 06/12/23 2147  BP: (!) 137/106   Pulse: (!) 109   Resp: (!) 22   Temp:  (!) 101.2 F (38.4 C)  TempSrc:  Rectal  SpO2: 90%     Isolation Precautions No active isolations  Medications Medications  levETIRAcetam  (KEPPRA ) IVPB 1000 mg/100 mL premix (has no administration in time range)  insulin  glargine-yfgn (SEMGLEE ) injection 3 Units (has no administration in time range)  insulin  aspart (novoLOG ) injection 0-6 Units (has no administration in time range)  ondansetron  (ZOFRAN ) injection 4 mg (has no administration in time range)  LORazepam  (ATIVAN ) injection 1 mg (has no administration in time range)  labetalol  (NORMODYNE ) injection 10 mg (has no administration in time range)  sodium chloride  0.9 % bolus 500 mL (0 mLs Intravenous Stopped 06/13/23 0011)  ceFEPIme  (MAXIPIME ) 2 g in sodium  chloride 0.9 % 100 mL IVPB (0 g Intravenous Stopped 06/12/23 2253)  metroNIDAZOLE  (FLAGYL ) IVPB 500 mg (0 mg Intravenous Stopped 06/13/23 0011)  vancomycin  (VANCOCIN ) IVPB 1000 mg/200 mL premix (0 mg Intravenous Stopped 06/13/23 0011)  ondansetron  (ZOFRAN ) injection 4 mg (4 mg Intravenous Given 06/12/23 2245)  metoCLOPramide  (REGLAN ) injection 5 mg (5 mg Intravenous Given 06/13/23 0010)    Mobility walks     Focused Assessments Cardiac Assessment Handoff:    Lab Results  Component Value Date   CKTOTAL 278 (H) 06/03/2022   No results found for: DDIMER Does the Patient currently have chest pain? No   , Renal Assessment Handoff:  Hemodialysis Schedule: Unknown Last Hemodialysis date and time: Unknown'   Restricted appendage: left arm   R Recommendations: See Admitting Provider Note  Report given to:   Additional Notes:

## 2023-06-13 NOTE — ED Notes (Signed)
 PT stopped at CT before coming upstairs per receiving RN order, PT still nauseated so we will continue to 4N

## 2023-06-13 NOTE — Progress Notes (Signed)
 BG 150, to avoid needing IV fluids since she is hypervolemic we will transition her to basal bolus early. Per orderset recommending levemir  5 units BID + sensitive SSI . This is more insulin  than she is on at home, but likely appropriate with her body weight and considering her A1c is 15.  [name], DO 06/13/23 12:58 PM [facility]  For contact information, see Amion. If no response to pager, please call PCCM consult pager. After hours, 7PM- 7AM, please call Elink.

## 2023-06-13 NOTE — Progress Notes (Addendum)
 eLink Physician-Brief Progress Note Patient Name: Josalynn Johndrow DOB: 05-28-1991 MRN: [id number]   Date of Service  06/13/2023  HPI/Events of Note  Critical lab value of Potassium 2.7  CBG on admit was >1000, Her Insulin  gtt was discontinued today. She received 5 units of Semglee  at 1352-> CBG recheck at 1532 was 71. She did not receive further insulin  after that. Her CBG check at 1938 was 14 so she was given an amp of dextrose .  Home dose of Lantus  is 3 units daily.  eICU Interventions  Given hypoglycemic episode, favor holding nighttime Lantus  and lieu of 1 times daily dosing.  Maintain 5 units daily.  KCl ordered.   2340 -again hyperglycemic to less than 10.  Add dextrose  infusion at 30 cc/h.  Correlates with symptoms.  Add LFTs for the morning.  9665 -had 2 additional hypoglycemic episodes.  Increase D10 rate to 75 cc/hr Intervention Category Intermediate Interventions: Hyperglycemia - evaluation and treatment  Camdyn Beske 06/13/2023, 9:29 PM

## 2023-06-13 NOTE — Progress Notes (Signed)
 eLink Physician-Brief Progress Note Patient Name: Ann Robertson DOB: 27-Apr-1991 MRN: [id number]   Date of Service  06/13/2023  HPI/Events of Note  HTN 184/107 (129) and has gotten PRN Labetalol  10.  Seen asleep, not in distress  eICU Interventions  Ordered hydralazine  10 mg IV q 6 prn for SBP > 170     Intervention Category Intermediate Interventions: Hypertension - evaluation and management  Damien ONEIDA Grout 06/13/2023, 4:31 AM

## 2023-06-13 NOTE — Plan of Care (Signed)
  Problem: Fluid Volume: Goal: Ability to maintain a balanced intake and output will improve Outcome: Progressing   Problem: Nutritional: Goal: Maintenance of adequate nutrition will improve Outcome: Progressing   Problem: Skin Integrity: Goal: Risk for impaired skin integrity will decrease Outcome: Progressing   Problem: Tissue Perfusion: Goal: Adequacy of tissue perfusion will improve Outcome: Progressing   Problem: Activity: Goal: Risk for activity intolerance will decrease Outcome: Progressing   Problem: Coping: Goal: Level of anxiety will decrease Outcome: Progressing   Problem: Pain Management: Goal: General experience of comfort will improve Outcome: Progressing   Problem: Safety: Goal: Ability to remain free from injury will improve Outcome: Progressing   Problem: Skin Integrity: Goal: Risk for impaired skin integrity will decrease Outcome: Progressing

## 2023-06-13 NOTE — Progress Notes (Signed)
 NAME:  Ann Robertson, MRN:  [id number], DOB:  [birth year], LOS: 1 ADMISSION DATE:  06/12/2023, CONSULTATION DATE:  06/13/23 REFERRING MD:  Curtistine Dawn, MD CHIEF COMPLAINT:  Seizures   History of Present Illness:  33 y.o. female with a history of medical nonadherence.  Given patient's altered mentation at the time of my evaluation history was obtained from the consulting ED provider as well as her electronic medical record. I spoke with her father, Sacha Topor, by phone. He reports his grandson called him when she started shaking and eyes were rolling into the back of her head. He reports on his arrival she had calmed down. She was talking to him and said her throat was hurting and she wasn't feeling good. He was present when she developed a whole body shaking. He reports her mouth closed and wasn't able to open it. He isn't sure if she was incontinent. He reports she has never had shaking of this nature before that he's aware. He believes her last dialysis session was Friday. He has noticed progressively increasing stomach distension. He is not aware of any sick contacts in her home. He isn't sure if she's compliant with her medication. He reports since her last admission she has continued to complain pain in her throat. He is unaware of any other symptoms or complaints. Patient notably has only seen her pain specialist since her hospitalization per her father's report.  In reviewing the patient's discharge summary from 06/02/2023 patient was plan to continue hemodialysis on Monday, listed, and Friday as well as follow-up with endocrinology.  Patient underwent paracentesis on 12/30 which had a negative Gram stain and patient completed 5 days of empiric antibiotics for spontaneous bacterial peritonitis per report.  Patient's anemia was reportedly stable at the time of discharge as well.  Reflux was noted.  Patient was admitted to hospital with DKA and was treated as such initially.  CT  imaging demonstrated gastroenteritis.  Patient notably had volume overload and this was thought to be the cause for her ascites as well.  At the time of discharge she was continued on labetalol  and a statin.  Patient was also on Lasix  80 mg by mouth daily.  She was discharged on Lantus  3 units subcu daily along with lispro sliding scale insulin .  ED provider reports that on EMS arrival patient had an additional seizure and was given 5 mg of IM Versed .  Patient was started on IV fluid and transported to the emergency department.  In the ED her blood glucose was notably elevated.  An NG tube was placed for decompression.  At patient notably had emesis with placement of NG tube.  She received 1 g of IV Keppra  in the ED as well as 5 mg of IV Reglan .  With concern for possible sepsis patient was given empiric vancomycin  and Flagyl .  Per neuro note- had been cutting her HD sessions short PTA.   Pertinent  Medical History  ESRD with history of poor adherence to HD Poorly-controlled IDDM Neuropathy Tobacco abuse  Significant Hospital Events: Including procedures, antibiotic start and stop dates in addition to other pertinent events   01/12 - Presented to hospital by EMS >> Admitted by PCCM  Interim History / Subjective:  Complains of discomfort with how she is lying. No pain. Remains on insulin  infusion.  Objective   Blood pressure (!) 159/89, pulse 82, temperature 98.8 F (37.1 C), temperature source Oral, resp. rate 18, height 5' 5 (1.651 m), weight 49.4 kg, SpO2  100%.        Intake/Output Summary (Last 24 hours) at 06/13/2023 1007 Last data filed at 06/13/2023 0800 Gross per 24 hour  Intake 1021.67 ml  Output --  Net 1021.67 ml   Filed Weights   06/13/23 0021 06/13/23 0830  Weight: 49.4 kg 49.4 kg    Examination: General:  chronically ill appearing woman lying in bed in NAD Derm: skin warm, dry, no rashes. Thin hair.    Extremities:  ++edema HEENT: [place]/AT, eyes  anicteric Cardiovascular:  S1S2, RRR Pulmonary: breathing comfortably on RA, CTAB Abdomen: soft, mildly distended Musculoskeletal:  minimal muscle mass, dependent edema Neurological:  awake, able to answer questions but does not consistently answer. Normal speech. Globally weak, no focal deficits.  Psychiatric: not cooperative with exam, awake but delays answering questions.   Na+ 120>122>122> 127 over 9 hours.  BG  down to 300s Beta hydroxybutyric acid undetectable low Bicarb 22 BUN 14 Cr 5 Phos 2.2 AST 139 ALT 56 LA 3.4> 2.9> 4.3 RUQ_ massive ascites, mildly thickened GB wall with no stones or sludge, negative murphy's. No ductal dilation.   Resolved Hospital Problem list   N/A  Assessment & Plan:  Seizure, new onset.  Multiple potential causes for this including patient's hyponatremia, hyperglycemia, and uncontrolled hypertension.  -appreciate neurology's management- stopped keppra  -brain MRI ordered -EEG pending -monitor sodium closely; she is correcting too quickly -addressing hyperglycemia  Acute encephalopathy, suspect post-ictal confusion + benzo side effect: Present on arrival.   -neuro checks -seizure precaitions -discussed post-seizure safety concerns. She is aware she cannot drive x 6 months nor should he bathe/ swim alone or operate heavy machinery.   Hyponatremia- need to prevent overcorrection ongoing -recheck BMP now; anticipate she will need D5w to slow her down. -sips & chips -she is resistant to frequent lab draws -needs to remain in  ICU for close monitoring today  Hyperglycemia without DKA -insulin  gtt; can transition off to basal bolus insulin  when controlled  Lactic acidosis, initially suspected due to seizure, but no good explanation for rise since then -repeat LA level  Transaminase election, large volume of ascites likely due to congestion from inadequate volume control chronically. Hepatitis panel negative, neg acetaminophen  and salicylates.   RUQ not concerning for cholecystitis. -needs volume control with HD when more stable; agree with nephrology that her dry weight is likely too high  Hypothyroidism; new diagnosis -start low dose synthroid  -needs OP follow up  Possible bowel obstruction vs gastroparesis -monitor clinically; advance diet slowly as she can tolerate. Starting with clears today.  Fever- suspect this is due to seizure -follow cultures; low threshold to start antibiotics if she re-fevers  ESRD on hemodialysis -nephro consulted; no urgent HD indications at present -strict I/O -renally dose meds, avoid nephrotoxic meds  Pseudohyponatremia Secondary to hyperglycemia.  Repeating electrolyte panel in the morning with gradual correction of hyperglycemia.  Uncontrolled hypertension: -resume PTA labetalol  -add amlodipine  5mg  daily  Anemia and thrombocytopenia- both chronic  -monitor. No current indication for transfusion  Lactic acidosis, suspect slow clearance -Monitor periodically; downtrending as of late this morning  Hyperlipidemia -statin    Best Practice (right click and Reselect all SmartList Selections daily)   Diet/type: clear liquids-sips and chips DVT prophylaxis prophylactic heparin   Pressure ulcer(s): N/A GI prophylaxis: N/A Lines: N/A Foley:  N/A Code Status:  full code Last date of multidisciplinary goals of care discussion [with father 1/12]  Labs   CBC: Recent Labs  Lab 06/12/23 2101 06/12/23 2130 06/12/23 2201  WBC 2.3*  --   --   NEUTROABS 1.3*  --   --   HGB 9.7* 12.9  12.9 10.2*  HCT 34.6* 38.0  38.0 30.0*  MCV 96.9  --   --   PLT 85*  --   --     Basic Metabolic Panel: Recent Labs  Lab 06/12/23 2101 06/12/23 2130 06/12/23 2201 06/13/23 0226 06/13/23 0630  NA 120* 122*  122* 122*  --  127*  K 3.8 3.9  3.9 4.2  --  3.2*  CL 84* 85*  --   --  89*  CO2 23  --   --   --  22  GLUCOSE 1,094* >700*  --   --  605*  BUN 13 15  --   --  14  CREATININE 4.88*  4.90*  --   --  5.00*  CALCIUM  7.8*  --   --   --  7.5*  MG  --   --   --  1.6* 1.6*  PHOS  --   --   --  2.5 2.2*   GFR: Estimated Creatinine Clearance: 12.4 mL/min (A) (by C-G formula based on SCr of 5 mg/dL (H)). Recent Labs  Lab 06/12/23 2101 06/12/23 2130 06/13/23 0226 06/13/23 0630  WBC 2.3*  --   --   --   LATICACIDVEN  --  3.4* 2.9* 4.3*    Liver Function Tests: Recent Labs  Lab 06/12/23 2101 06/13/23 0630  AST 388* 139*  ALT 86* 56*  ALKPHOS 297* 217*  BILITOT 0.9 1.0  PROT 7.9 6.8  ALBUMIN  3.2* 2.7*   Recent Labs  Lab 06/12/23 2101 06/13/23 0226  LIPASE 25  --   AMYLASE  --  27*      HbA1C: HbA1c POC (<> result, manual entry)  Date/Time Value Ref Range Status  09/10/2020 01:35 PM >15 4.0 - 5.6 % Final   Hgb A1c MFr Bld  Date/Time Value Ref Range Status  05/30/2023 01:12 AM >15.5 (H) 4.8 - 5.6 % Final    Comment:    (NOTE)         Prediabetes: 5.7 - 6.4         Diabetes: >6.4         Glycemic control for adults with diabetes: <7.0   02/08/2022 09:41 PM 14.3 (H) 4.8 - 5.6 % Final    Comment:    (NOTE) Pre diabetes:          5.7%-6.4%  Diabetes:              >6.4%  Glycemic control for   <7.0% adults with diabetes     CBG: Recent Labs  Lab 06/13/23 0701 06/13/23 0733 06/13/23 0807 06/13/23 0847 06/13/23 0948  GLUCAP 483* 425* 428* 356* 326*    Critical care time: N/A     This patient is critically ill with multiple organ system failure which requires frequent high complexity decision making, assessment, support, evaluation, and titration of therapies. This was completed through the application of advanced monitoring technologies and extensive interpretation of multiple databases. During this encounter critical care time was devoted to patient care services described in this note for 41 minutes.  [name], DO 06/13/23 10:07 AM [facility]  For contact information, see Amion. If no response to pager,  please call PCCM consult pager. After hours, 7PM- 7AM, please call Elink.

## 2023-06-13 NOTE — Procedures (Signed)
 Patient Name: Ann Robertson  MRN: [id number]  Epilepsy Attending: Arlin MALVA Krebs  Referring Physician/Provider: Noreen Tonnie BRAVO, MD  Date: 06/13/2023 Duration: 22.34 mins  Patient history: 33yo F presented with seizure in the setting of severe hyperglycemia. EEG to evaluate for seizure  Level of alertness: Awake  AEDs during EEG study: LEV  Technical aspects: This EEG study was done with scalp electrodes positioned according to the 10-20 International system of electrode placement. Electrical activity was reviewed with band pass filter of 1-70Hz , sensitivity of 7 uV/mm, display speed of 51mm/sec with a 60Hz  notched filter applied as appropriate. EEG data were recorded continuously and digitally stored.  Video monitoring was available and reviewed as appropriate.  Description: The posterior dominant rhythm consists of 8 Hz activity of moderate voltage (25-35 uV) seen predominantly in posterior head regions, symmetric and reactive to eye opening and eye closing. EEG showed continuous generalized 5 to 7 Hz theta slowing. Hyperventilation and photic stimulation were not performed.     ABNORMALITY - Continuous slow, generalized  IMPRESSION: This study is suggestive of mild diffuse encephalopathy. No seizures or epileptiform discharges were seen throughout the recording.  Ann Robertson

## 2023-06-13 NOTE — Progress Notes (Signed)
 BSRN secure chatted pertaining to ordered blood cultures not showing in process and got no response. All Abx were subsequently given. After arriving on floor instructed by Dr Violet Baldy to go ahead and draw blood culture anyway.

## 2023-06-13 NOTE — Consult Note (Signed)
 NEUROLOGY CONSULT NOTE   Date of service: June 13, 2023 Patient Name: Ann Robertson MRN:  [id number] DOB:  22-Dec-1990 Chief Complaint: Seizure Requesting Provider: Noreen Tonnie BRAVO, MD  History of Present Illness  Ann Robertson is a 33 y.o. 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  She was also treated for concern for SBP  History is quite limited as the patient is fatigued and reluctant to participate in examination.  However she does report that she continued to have to cut her dialysis sessions short after discharge due to discomfort on the dialysis beds.  She reports they do not allow her to bring her pillow to keep her self comfortable.  She otherwise denies any other symptoms at home, reports she was doing fine otherwise without fevers or other concerns  She endorses that she had a seizure but cannot provide any details of the event, does not reply whether she had any aura or warning.  Eventually she does states she thinks her head may have turned in 1 direction at the beginning of the event  She also reports that she thought her sugar was low at home and did not realize it was so high  She is presenting today with concern for seizure in the setting of glucose >1000, for which she did receive 5 mg of Versed  with EMS, who noted vitals on their initial arrival of blood pressure 230/120, 116hr, 100% 8l, 24rr shallow, High bgl per triage notes  She was treated with 1 g of Keppra  in the ED in addition to vancomycin , metronidazole , cefepime   Risk factors:  Birth and development: Normal Febrile seizures in childhood: No Significant head trauma: No Intracranial surgeries: No Mengingitis/Encephalitis  history: No Family history of seizures or developmental delay: No  She reports she has her driver's license but does not drive, her father or her son drive for her  ROS  Limited by patient participation, included in HPI as able to obtain  Past History   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   Neuropathy    Preeclampsia    STD (sexually transmitted disease)     Past Surgical History:  Procedure Laterality Date   AV FISTULA PLACEMENT Left 06/19/2021   Procedure: INSERTION OF ARTERIOVENOUS (AV) GORE-TEX GRAFT ARM;  Surgeon: Eliza Lonni RAMAN, MD;  [facility] OR;  Service: Vascular;  Laterality: Left;   CESAREAN SECTION     CESAREAN SECTION N/A 07/05/2013   Procedure: CESAREAN SECTION;  Surgeon: Harland JAYSON Birkenhead, MD;  Location: [facility] ORS;  Service: Obstetrics;  Laterality: N/A;   CESAREAN SECTION N/A 07/25/2014   Procedure: CESAREAN SECTION;  Surgeon: Glenys RAMAN Birk, MD;  Location: [facility] ORS;  Service: Obstetrics;  Laterality: N/A;   CESAREAN SECTION N/A 11/21/2015   Procedure: CESAREAN SECTION;  Surgeon: Glenys RAMAN Birk, MD;  Location: [facility] BIRTHING SUITES;  Service: Obstetrics;  Laterality: N/A;   IR FLUORO GUIDE CV LINE RIGHT  06/16/2021   IR US  GUIDE VASC ACCESS RIGHT  06/16/2021   WISDOM TOOTH EXTRACTION      Family History: Family History  Problem Relation Age of Onset   Stroke Mother    Hypertension Mother    Heart disease Mother    Kidney disease Mother    Hyperlipidemia Father    Diabetes Father    Seizures Neg Hx     Social History  reports that she has been smoking cigarettes. She has a 2.3 pack-year smoking history. She has never used smokeless tobacco. She reports that she does not currently use alcohol . She reports that she does not use drugs.  Allergies  Allergen Reactions   Dilaudid [Hydromorphone] Itching and Rash    Medications    Current Facility-Administered Medications:    Chlorhexidine  Gluconate Cloth 2 % PADS 6 each, 6 each, Topical, Daily, Nestor, Jennings E, MD   dextrose  50 % solution 0-50 mL, 0-50 mL, Intravenous, PRN, Aventura, Damien DASEN, MD   insulin  glargine-yfgn (SEMGLEE ) injection 3 Units, 3 Units, Subcutaneous, Q24H, Nestor, Jennings E, MD, 3 Units at 06/13/23 0033   insulin  regular, human (MYXREDLIN ) 100 units/ 100 mL infusion, , Intravenous, Continuous, Aventura, Emily T, MD, Last Rate: 4.4 mL/hr at 06/13/23 0329, 4.4 Units/hr at 06/13/23 0329   labetalol  (NORMODYNE ) injection 10 mg, 10 mg, Intravenous, Q4H PRN, Nestor, Jennings E, MD, 10 mg at 06/13/23 0133   LORazepam  (ATIVAN ) injection 1 mg, 1 mg, Intravenous, Q15 min PRN, Nestor, Jennings E, MD   ondansetron  (ZOFRAN ) injection 4 mg, 4 mg, Intravenous, Q6H PRN, Noreen Tonnie BRAVO, MD  Vitals   Vitals:   06/13/23 0021 06/13/23 0125 06/13/23 0126 06/13/23 0145  BP:  (!) 188/110 (!) 199/110 (!) 136/95  Pulse:  93  73  Resp:  18 14 14   Temp:  (!) 97.4 F (36.3 C)    TempSrc:  Axillary    SpO2:  100%  99%  Weight: 49.4 kg     Height: 5' 5 (1.651 m)       Body mass index is 18.12 kg/m.  Physical Exam   Constitutional: Appears severely malnourished Psych: Affect flat, reluctant to participate but redirectable Eyes: No scleral injection.  HENT: No OP obstruction.  Head: Normocephalic.  Cardiovascular: Normal rate and regular rhythm.  Respiratory: Effort normal, non-labored breathing.  GI: Distended   Neurologic Examination   Limited at patient request, even the below examination was performed after repeated attempts and discussion/negotiation with the patient  Mental status: Awake, alert, oriented to person, place, time, situation, able to give a coherent history.  Normal casual speech, formal language testing not completed due to patient repeatedly requesting examination to be terminated.    Cranial nerves: EOMI, does briefly open  eyes but not long enough for pupillary examination.  Face symmetric.  Motor: Able to maintain bilateral upper extremities antigravity for at least 10 seconds.  Reports she is too weak to move her bilateral lower extremities antigravity but will wiggle her toes bilaterally to command.  Diffuse muscle wasting, normal tone  Sensory: Reports sensation is intact to light touch/temperature throughout  Reflexes:  2+ and symmetric at the patellae   Labs/Imaging/Neurodiagnostic studies    Basic Metabolic Panel: Recent Labs  Lab 06/12/23 2101 06/12/23 2130 06/12/23 2201 06/13/23 0226  NA 120* 122*  122* 122*  --   K 3.8 3.9  3.9 4.2  --   CL 84* 85*  --   --   CO2 23  --   --   --   GLUCOSE 1,094* >700*  --   --   BUN 13 15  --   --  CREATININE 4.88* 4.90*  --   --   CALCIUM  7.8*  --   --   --   MG  --   --   --  1.6*  PHOS  --   --   --  2.5    CBC: Recent Labs  Lab 06/12/23 2101 06/12/23 2130 06/12/23 2201  WBC 2.3*  --   --   NEUTROABS 1.3*  --   --   HGB 9.7* 12.9  12.9 10.2*  HCT 34.6* 38.0  38.0 30.0*  MCV 96.9  --   --   PLT 85*  --   --     Coagulation Studies: No results for input(s): LABPROT, INR in the last 72 hours.    Lipid Panel:  Lab Results  Component Value Date   LDLCALC 60 02/23/2022   HgbA1c:  Lab Results  Component Value Date   HGBA1C >15.5 (H) 05/30/2023   Urine Drug Screen:     Component Value Date/Time   LABOPIA NONE DETECTED 02/21/2022 1724   COCAINSCRNUR NONE DETECTED 02/21/2022 1724   COCAINSCRNUR NEG 05/06/2014 1029   LABBENZ NONE DETECTED 02/21/2022 1724   AMPHETMU NONE DETECTED 02/21/2022 1724   THCU NONE DETECTED 02/21/2022 1724   LABBARB NONE DETECTED 02/21/2022 1724    Alcohol  Level     Component Value Date/Time   ETH <10 05/30/2023 0606   INR  Lab Results  Component Value Date   INR 1.6 (H) 05/30/2023   APTT  Lab Results  Component Value Date   APTT 35 05/30/2023   CT Head without  contrast(Personally reviewed): 1. No acute intracranial abnormality. 2. Calcified atherosclerosis about the skull base, advanced for age.  CT abdomen pelvis: Large volume abdominopelvic ascites. Small right pleural effusion. Multifocal atelectasis, right lower lobe predominant.  CXR Low lung volumes with suggestion of developing multifocal pneumonia and bilateral trace pleural effusions, right greater than left. Consider follow-up with PA and lateral view the chest with improved inspiratory effort for better evaluation of the lungs.    MRI Brain(Personally reviewed): Pending  Neurodiagnostics rEEG:  Pending  ASSESSMENT   Trinady Milewski is a 33 y.o. female  has a past medical history of ESRD (end stage renal disease) (HCC), High cholesterol, HSV-2 infection, Juvenile diabetes (05/31/1998), Neuropathy, Preeclampsia, and STD (sexually transmitted disease).   She presented with seizure in the setting of severe hyperglycemia, which can cause focal neurological symptoms as well as seizure.  Fortunately no further seizure activity here although she did receive 1 g of Keppra  as well.  Low concern for meningitis or press at this time given her rapid improvement with metabolization of the Versed  she received as well as treatment of her blood sugar  Low concern for PRES again due to her rapid improvement in ability to fully answer orientation questions and follow commands easily  Her fever seems to have also rapidly resolved; slight fever can be seen in the setting of seizure but appreciate primary team's attention to any infectious etiologies as this would be a diagnosis of exclusion  RECOMMENDATIONS  -Appreciate management of hyperglycemia, ascites, ESRD, hypomagnesemia and other comorbidities per primary team -No indication for ongoing antiseizure medications at this time -Mainstay of treatment will be long-term goal of euglycemia, discussed with patient -Seizure precautions  reviewed with patient and should be included in discharge instructions -Neurology will follow-up MRI and EEG studies if these are reassuring we will sign off -Please do not hesitate to reach out to neurology if patient  has further concern for seizure activity or if other questions or concerns arise. -Recommendations conveyed to primary team Dr. Noreen via secure chat  Standard seizure precautions: Per [place]  DMV statutes, patients with seizures are not allowed to drive until  they have been seizure-free for six months. Use caution when using heavy equipment or power tools. Avoid working on ladders or at heights. Take showers instead of baths. Ensure the water temperature is not too high on the home water heater. Do not go swimming alone. When caring for infants or small children, sit down when holding, feeding, or changing them to minimize risk of injury to the child in the event you have a seizure.  To reduce risk of seizures, maintain good sleep hygiene avoid alcohol  and illicit drug use, take all anti-seizure medications as prescribed [none prescribed at this time]. Please keep your glucose within normal range as both low glucose and high glucose can lead to seizures.   ______________________________________________________________________   Lola Jernigan MD-PhD [facility] [phone] Available 7 PM to 7 AM, outside of these hours please call Neurologist on call as listed on Amion.

## 2023-06-13 NOTE — TOC CM/SW Note (Signed)
 Transition of Care [facility]) - Inpatient Brief Assessment   Patient Details  Name: Shakila Mak MRN: [id number] Date of Birth: 09/02/1990  Transition of Care [facility]) CM/SW Contact:    Jamilex Bohnsack M, RN Phone Number: 06/13/2023, 4:56 PM   Clinical Narrative: Patient admitted on 06/12/2023 after tonic-clonic seizure at home which is new w/o any prior hx of seizure disorder. Patient with ESRD on hemodialysis MWF, uncontrolled HTN, and diabetes.  Patient just discharged on 06/02/2023, and at that time was living with ex-husband and children.    Transition of Care Asessment: Insurance and Status: Insurance coverage has been reviewed Patient has primary care physician: Yes Francie Jama Norse) Home environment has been reviewed: Lives with ex-husband, children Prior level of function:: Independent Prior/Current Home Services: No current home services Social Drivers of Health Review: SDOH reviewed no interventions necessary Readmission risk has been reviewed: Yes Transition of care needs: transition of care needs identified, TOC will continue to follow   Mliss MICAEL Fass, RN, BSN  Trauma/Neuro ICU Case Manager [phone]

## 2023-06-13 NOTE — ED Notes (Signed)
 55 min passed, RN sent a message and no response. Floor was called and notified.

## 2023-06-13 NOTE — Consult Note (Signed)
 Ann Robertson Admit Date: 06/12/2023 06/13/2023 Bernardino KATHEE Gasman Requesting Physician:  Gretta DO  Reason for Consult:  ESRD Comanagement, Hypervolemia HPI:  61F ESRD MWF [place] admitted 06/12/23 after seizure at home with encephalopathy.  CT Head w/o acute process. In ED noted to be severely hyperglycemic with normal BHB consistent with HHS.  Neuro following.  Placed on insulin  gtt and admitted to ICU.  Additional imaging includes CT Abd/Pelvis and RUQ US  both demonstrated large volume ascites.    Outpt HD: [facility] 4h, BFR 400 DFR a1.5, 2K 3Ca bath usign LUE AVG. F180.  Mircera last dose 12/24 50mcg, C3 0.25mcg three times per week.  Last HD 06/10/23 left 4kg above EDW.    Labs to date include improving but persistent hyperglycemia, hyponatremia but inc serum osmolality consistent with pseudohyponatremia.  K is 2.9 most recently and HCO3 22.  Hb 8.5  PMH Incudes: DM1, recurrent severe hyperglycemia Chronic anasarca / volume overload  Today she is poorly cooperative with an exam.  She prefers to be covered up and only reluctantly answers some questions.     Creat (mg/dL)  Date Value  93/91/7982 0.64  05/06/2014 0.66  01/08/2013 0.60   Creatinine, Ser (mg/dL)  Date Value  98/86/7974 5.00 (H)  06/12/2023 4.90 (H)  06/12/2023 4.88 (H)  06/02/2023 5.05 (H)  06/01/2023 4.79 (H)  05/31/2023 6.61 (H)  05/30/2023 6.13 (H)  05/30/2023 6.51 (H)  05/30/2023 6.58 (H)  05/30/2023 6.80 (H)  ] I/Os: I/O last 3 completed shifts: In: 1000.8 [IV Piggyback:1000.8] Out: -    Unable to complete ROS 2/2 patients choice not to engage  PMH  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   Neuropathy    Preeclampsia    STD (sexually transmitted disease)    PSH  Past Surgical History:  Procedure Laterality Date   AV FISTULA PLACEMENT Left 06/19/2021    Procedure: INSERTION OF ARTERIOVENOUS (AV) GORE-TEX GRAFT ARM;  Surgeon: Eliza Lonni RAMAN, MD;  Location: [facility] OR;  Service: Vascular;  Laterality: Left;   CESAREAN SECTION     CESAREAN SECTION N/A 07/05/2013   Procedure: CESAREAN SECTION;  Surgeon: Harland JAYSON Birkenhead, MD;  Location: [facility] ORS;  Service: Obstetrics;  Laterality: N/A;   CESAREAN SECTION N/A 07/25/2014   Procedure: CESAREAN SECTION;  Surgeon: Glenys RAMAN Birk, MD;  Location: [facility] ORS;  Service: Obstetrics;  Laterality: N/A;   CESAREAN SECTION N/A 11/21/2015   Procedure: CESAREAN SECTION;  Surgeon: Glenys RAMAN Birk, MD;  Location: [facility] BIRTHING SUITES;  Service: Obstetrics;  Laterality: N/A;   IR FLUORO GUIDE CV LINE RIGHT  06/16/2021   IR US  GUIDE VASC ACCESS RIGHT  06/16/2021   WISDOM TOOTH EXTRACTION     FH  Family History  Problem Relation Age of Onset   Stroke Mother    Hypertension Mother    Heart disease Mother    Kidney disease Mother    Hyperlipidemia Father    Diabetes Father    Seizures Neg Hx    SH  reports that she has been smoking cigarettes. She has a 2.3 pack-year smoking history. She has never used smokeless tobacco. She reports that she does not currently use alcohol . She reports that she does not use drugs. Allergies  Allergies  Allergen Reactions   Dilaudid [Hydromorphone] Itching and Rash  Home medications Prior to Admission medications   Medication Sig Start Date End Date Taking? Authorizing Provider  polycarbophil (FIBERCON) 625 MG tablet Take 1 tablet (625 mg total) by mouth 2 (two) times daily. 02/03/23   Marylu Gee, DO  furosemide  (LASIX ) 80 MG tablet Take 80 mg by mouth daily.    [provider]  gabapentin  (NEURONTIN ) 100 MG capsule Take 1 capsule (100 mg total) by mouth 3 (three) times daily as needed (neuropathy). 02/03/23   Marylu Gee, DO  hydrOXYzine  (ATARAX ) 25 MG tablet Take 25 mg by mouth.    [provider]  insulin  glargine (LANTUS  SOLOSTAR) 100 UNIT/ML Solostar Pen Inject 3 Units  into the skin daily.    [provider]  insulin  lispro (HUMALOG ) 100 UNIT/ML KwikPen Inject 0-6 Units into the skin 3 (three) times daily with meals. Check Blood Glucose (BG) and inject per scale: BG <150= 0 unit; BG 150-200= 1 unit; BG 201-250= 2 unit; BG 251-300= 3 unit; BG 301-350= 4 unit; BG 351-400= 5 unit; BG >400= 6 unit and Call Primary Care. 02/03/23   Marylu Gee, DO  labetalol  (NORMODYNE ) 100 MG tablet Take 1 tablet (100 mg total) by mouth 2 (two) times daily. 02/03/23   Marylu Gee, DO  lidocaine -prilocaine  (EMLA ) cream Apply 1 Application topically as needed (For port access). 02/03/23   Marylu Gee, DO  lipase/protease/amylase (CREON ) 36000 UNITS CPEP capsule Take 2 capsules (72,000 Units total) by mouth 3 (three) times daily with meals. May also take 1 capsule (36,000 Units total) as needed (with snacks - up to 4 snacks daily). 02/14/23   Marylu Gee, DO  loperamide  (IMODIUM ) 2 MG capsule Take 2 capsules (4 mg total) by mouth as needed for diarrhea or loose stools. Not to exceed 8 caps per day 02/03/23   Marylu Gee, DO  methocarbamol  (ROBAXIN ) 750 MG tablet Take 1 tablet (750 mg total) by mouth every 8 (eight) hours as needed for muscle spasms. 02/03/23   Marylu Gee, DO  omeprazole  (PRILOSEC) 20 MG capsule Take 1 capsule (20 mg total) by mouth daily as needed (acid reflux/heartburn). 02/03/23   Marylu Gee, DO  ondansetron  (ZOFRAN ) 4 MG tablet Take 1 tablet (4 mg total) by mouth every 8 (eight) hours as needed for nausea or vomiting. 06/02/23   Bryn Bernardino NOVAK, MD  oxyCODONE -acetaminophen  (PERCOCET) 10-325 MG tablet Take 1 tablet by mouth every 6 (six) hours as needed for pain.    [provider]  pantoprazole  (PROTONIX ) 40 MG tablet Take 1 tablet (40 mg total) by mouth daily. 06/02/23   Bryn Bernardino NOVAK, MD  rosuvastatin  (CRESTOR ) 10 MG tablet Take 1 tablet (10 mg total) by mouth daily. 02/04/23   Marylu Gee, DO  Acidophilus Lactobacillus CAPS Take 1 capsule by mouth 3  (three) times daily with meals. 02/03/23   Marylu Gee, DO    Current Medications Scheduled Meds:  Chlorhexidine  Gluconate Cloth  6 each Topical Daily   levothyroxine   25 mcg Oral Q0600   Continuous Infusions:  insulin  4.8 Units/hr (06/13/23 0800)   PRN Meds:.dextrose , hydrALAZINE , labetalol , LORazepam , ondansetron  (ZOFRAN ) IV  CBC Recent Labs  Lab 06/12/23 2101 06/12/23 2130 06/12/23 2201  WBC 2.3*  --   --   NEUTROABS 1.3*  --   --   HGB 9.7* 12.9  12.9 10.2*  HCT 34.6* 38.0  38.0 30.0*  MCV 96.9  --   --   PLT 85*  --   --    Basic Metabolic Panel Recent  Labs  Lab 06/12/23 2101 06/12/23 2130 06/12/23 2201 06/13/23 0226 06/13/23 0630  NA 120* 122*  122* 122*  --  127*  K 3.8 3.9  3.9 4.2  --  3.2*  CL 84* 85*  --   --  89*  CO2 23  --   --   --  22  GLUCOSE 1,094* >700*  --   --  605*  BUN 13 15  --   --  14  CREATININE 4.88* 4.90*  --   --  5.00*  CALCIUM  7.8*  --   --   --  7.5*  PHOS  --   --   --  2.5 2.2*    Physical Exam  Blood pressure (!) 164/100, pulse 85, temperature 98.8 F (37.1 C), temperature source Oral, resp. rate 18, height 5' 5 (1.651 m), weight 49.4 kg, SpO2 100%. GEN: Young chronically ill appearing female ENT: NCAT EYES: EOMI CV: RRR PULM: CTAB, nl wob, no crackles ABD: s/nt, distended SKIN: No rashes/lesions EXT: 3+ LEE LUE AVG+B/T  Assessment 50F ESRD MWF [place] using LUE AVG here with seizures/AMS, HHS on insulin  gtt, and recurrent/persistent hypervolemia.  ESRD MWF LUE AVG [place] HTN with Chronic Hypervolemia with LEE and ascites; chronic high IDWG; euvolemic needed  DM1 with HHS on insulin  gtt per CCM Anemia of CKD/ESRD Pseudohyponatremia; improving CKD-BMD  Plan No emergent HD needs, hopefully can get off insulin  gtt first to faciliate HD in [facility] given census.  Needs to improve IDWG -- Na and Fluid intake. They're trying at the outpt unit Trend Hb, dose ESA as needed Will follow along.     Bernardino KATHEE Gasman   06/13/2023, 11:17 AM

## 2023-06-13 NOTE — Progress Notes (Signed)
 EEG complete - results pending. NA/SH

## 2023-06-14 DIAGNOSIS — E039 Hypothyroidism, unspecified: Secondary | ICD-10-CM | POA: Diagnosis not present

## 2023-06-14 DIAGNOSIS — N186 End stage renal disease: Secondary | ICD-10-CM | POA: Diagnosis not present

## 2023-06-14 DIAGNOSIS — R569 Unspecified convulsions: Secondary | ICD-10-CM | POA: Diagnosis not present

## 2023-06-14 DIAGNOSIS — E871 Hypo-osmolality and hyponatremia: Secondary | ICD-10-CM | POA: Diagnosis not present

## 2023-06-14 LAB — HEPATIC FUNCTION PANEL
ALT: 33 U/L (ref 0–44)
AST: 76 U/L — ABNORMAL HIGH (ref 15–41)
Albumin: 2.1 g/dL — ABNORMAL LOW (ref 3.5–5.0)
Alkaline Phosphatase: 163 U/L — ABNORMAL HIGH (ref 38–126)
Bilirubin, Direct: 0.3 mg/dL — ABNORMAL HIGH (ref 0.0–0.2)
Indirect Bilirubin: 0.8 mg/dL (ref 0.3–0.9)
Total Bilirubin: 1.1 mg/dL (ref 0.0–1.2)
Total Protein: 5.6 g/dL — ABNORMAL LOW (ref 6.5–8.1)

## 2023-06-14 LAB — RENAL FUNCTION PANEL
Albumin: 2.1 g/dL — ABNORMAL LOW (ref 3.5–5.0)
Anion gap: 9 (ref 5–15)
BUN: 16 mg/dL (ref 6–20)
CO2: 24 mmol/L (ref 22–32)
Calcium: 7.3 mg/dL — ABNORMAL LOW (ref 8.9–10.3)
Chloride: 89 mmol/L — ABNORMAL LOW (ref 98–111)
Creatinine, Ser: 5.41 mg/dL — ABNORMAL HIGH (ref 0.44–1.00)
GFR, Estimated: 10 mL/min — ABNORMAL LOW (ref >60)
Glucose, Bld: 123 mg/dL — ABNORMAL HIGH (ref 70–99)
Phosphorus: 1.6 mg/dL — ABNORMAL LOW (ref 2.5–4.6)
Potassium: 3.5 mmol/L (ref 3.5–5.1)
Sodium: 122 mmol/L — ABNORMAL LOW (ref 135–145)

## 2023-06-14 LAB — CBC
HCT: 25.1 % — ABNORMAL LOW (ref 36.0–46.0)
Hemoglobin: 7.9 g/dL — ABNORMAL LOW (ref 12.0–15.0)
MCH: 27.3 pg (ref 26.0–34.0)
MCHC: 31.5 g/dL (ref 30.0–36.0)
MCV: 86.9 fL (ref 80.0–100.0)
Platelets: 74 10*3/uL — ABNORMAL LOW (ref 150–400)
RBC: 2.89 MIL/uL — ABNORMAL LOW (ref 3.87–5.11)
RDW: 13.8 % (ref 11.5–15.5)
WBC: 4.3 10*3/uL (ref 4.0–10.5)
nRBC: 0 % (ref 0.0–0.2)

## 2023-06-14 LAB — GLUCOSE, CAPILLARY
Glucose-Capillary: 105 mg/dL — ABNORMAL HIGH (ref 70–99)
Glucose-Capillary: 119 mg/dL — ABNORMAL HIGH (ref 70–99)
Glucose-Capillary: 126 mg/dL — ABNORMAL HIGH (ref 70–99)
Glucose-Capillary: 144 mg/dL — ABNORMAL HIGH (ref 70–99)
Glucose-Capillary: 150 mg/dL — ABNORMAL HIGH (ref 70–99)
Glucose-Capillary: 52 mg/dL — ABNORMAL LOW (ref 70–99)
Glucose-Capillary: 55 mg/dL — ABNORMAL LOW (ref 70–99)
Glucose-Capillary: 57 mg/dL — ABNORMAL LOW (ref 70–99)
Glucose-Capillary: 88 mg/dL (ref 70–99)
Glucose-Capillary: 94 mg/dL (ref 70–99)
Glucose-Capillary: 98 mg/dL (ref 70–99)

## 2023-06-14 LAB — IRON AND TIBC
Iron: 117 ug/dL (ref 28–170)
Saturation Ratios: 93 % — ABNORMAL HIGH (ref 10.4–31.8)
TIBC: 126 ug/dL — ABNORMAL LOW (ref 250–450)
UIBC: 9 ug/dL

## 2023-06-14 LAB — LACTIC ACID, PLASMA: Lactic Acid, Venous: 1.2 mmol/L (ref 0.5–1.9)

## 2023-06-14 LAB — HEPATITIS B SURFACE ANTIBODY, QUANTITATIVE: Hep B S AB Quant (Post): 129 m[IU]/mL

## 2023-06-14 LAB — FERRITIN: Ferritin: 1492 ng/mL — ABNORMAL HIGH (ref 11–307)

## 2023-06-14 MED ORDER — LIDOCAINE HCL (PF) 1 % IJ SOLN
5.0000 mL | INTRAMUSCULAR | Status: DC | PRN
Start: 2023-06-14 — End: 2023-06-14

## 2023-06-14 MED ORDER — LIDOCAINE-PRILOCAINE 2.5-2.5 % EX CREA: 1.0000 | TOPICAL_CREAM | CUTANEOUS | Status: DC | PRN

## 2023-06-14 MED ORDER — INSULIN ASPART 100 UNIT/ML IJ SOLN
1.0000 [IU] | INTRAMUSCULAR | Status: DC
  Administered 2023-06-15 – 2023-06-17 (×4): 1 [IU] via SUBCUTANEOUS

## 2023-06-14 MED ORDER — DARBEPOETIN ALFA 60 MCG/0.3ML IJ SOSY
60.0000 ug | PREFILLED_SYRINGE | INTRAMUSCULAR | Status: DC
Start: 2023-06-14 — End: 2023-06-17
  Administered 2023-06-14: 60 ug via SUBCUTANEOUS
  Filled 2023-06-14 (×2): qty 0.3

## 2023-06-14 MED ORDER — PANTOPRAZOLE SODIUM 40 MG PO TBEC
40.0000 mg | DELAYED_RELEASE_TABLET | Freq: Every day | ORAL | Status: DC
  Administered 2023-06-14 – 2023-06-17 (×4): 40 mg via ORAL
  Filled 2023-06-14 (×4): qty 1

## 2023-06-14 MED ORDER — PENTAFLUOROPROP-TETRAFLUOROETH EX AERO: 1.0000 | INHALATION_SPRAY | CUTANEOUS | Status: DC | PRN

## 2023-06-14 NOTE — Progress Notes (Signed)
 NAME:  Ann Robertson, MRN:  [id number], DOB:  09-26-1990, LOS: 2 ADMISSION DATE:  06/12/2023, CONSULTATION DATE:  06/14/23 REFERRING MD:  Curtistine Dawn, MD CHIEF COMPLAINT:  Seizures   History of Present Illness:  33 y.o. female with a history of medical nonadherence.  Given patient's altered mentation at the time of my evaluation history was obtained from the consulting ED provider as well as her electronic medical record. I spoke with her father, Ann Robertson, by phone. He reports his grandson called him when she started shaking and eyes were rolling into the back of her head. He reports on his arrival she had calmed down. She was talking to him and said her throat was hurting and she wasn't feeling good. He was present when she developed a whole body shaking. He reports her mouth closed and wasn't able to open it. He isn't sure if she was incontinent. He reports she has never had shaking of this nature before that he's aware. He believes her last dialysis session was Friday. He has noticed progressively increasing stomach distension. He is not aware of any sick contacts in her home. He isn't sure if she's compliant with her medication. He reports since her last admission she has continued to complain pain in her throat. He is unaware of any other symptoms or complaints. Patient notably has only seen her pain specialist since her hospitalization per her father's report.  In reviewing the patient's discharge summary from 06/02/2023 patient was plan to continue hemodialysis on Monday, listed, and Friday as well as follow-up with endocrinology.  Patient underwent paracentesis on 12/30 which had a negative Gram stain and patient completed 5 days of empiric antibiotics for spontaneous bacterial peritonitis per report.  Patient's anemia was reportedly stable at the time of discharge as well.  Reflux was noted.  Patient was admitted to hospital with DKA and was treated as such initially.  CT  imaging demonstrated gastroenteritis.  Patient notably had volume overload and this was thought to be the cause for her ascites as well.  At the time of discharge she was continued on labetalol  and a statin.  Patient was also on Lasix  80 mg by mouth daily.  She was discharged on Lantus  3 units subcu daily along with lispro sliding scale insulin .  ED provider reports that on EMS arrival patient had an additional seizure and was given 5 mg of IM Versed .  Patient was started on IV fluid and transported to the emergency department.  In the ED her blood glucose was notably elevated.  An NG tube was placed for decompression.  At patient notably had emesis with placement of NG tube.  She received 1 g of IV Keppra  in the ED as well as 5 mg of IV Reglan .  With concern for possible sepsis patient was given empiric vancomycin  and Flagyl .  Per neuro note- had been cutting her HD sessions short PTA.   Pertinent  Medical History  ESRD with history of poor adherence to HD Poorly-controlled IDDM Neuropathy Tobacco abuse  Significant Hospital Events: Including procedures, antibiotic start and stop dates in addition to other pertinent events   01/12 - Presented to hospital by EMS >> Admitted by [facility] 1/14 HD-3L off  Interim History / Subjective:  She has some nausea and is hungry. Hypoglycemic overnight. When she drinks cold things she gets pain in her lower neck and chest; has h/o GERD.  Objective   Blood pressure (!) 147/78, pulse 88, temperature 98.3 F (36.8 C),  resp. rate 14, height 5' 5 (1.651 m), weight 56.6 kg, SpO2 100%.        Intake/Output Summary (Last 24 hours) at 06/14/2023 1014 Last data filed at 06/14/2023 0800 Gross per 24 hour  Intake 1032.11 ml  Output --  Net 1032.11 ml   Filed Weights   06/13/23 0830 06/14/23 0500 06/14/23 0825  Weight: 49.4 kg 56.6 kg 56.6 kg    Examination: General: chronically ill appearing woman lying in bed in NAD Derm: skin warm, dry   Extremities:   +edema HEENT: [place]/AT, eyes anicteric Cardiovascular:  S1S2, RRR Pulmonary: breathing comfortably on RA Abdomen: soft, NT Musculoskeletal:  minimal muscle mass Neurological:  awake, alert, answering questions appropriately. Psychiatric: more cooperative with exam today  Na+ 120>122>122> 127 > 122 BG low overnight Bicarb 24 BUN 16 Cr 5.41 Phos 1.6 AST 76 ALT 33 LA 3.4> 2.9> 4.3> 1.2 Blood cultures-no growth to date  Resolved Hospital Problem list   N/A  Assessment & Plan:  Seizure, new onset.  Multiple potential causes for this including patient's hyponatremia, hyperglycemia, and uncontrolled hypertension.  -appreciate neurology's management- stopped keppra  since this seizure was secondary to hyperglycemia -brain MRI pending; asked RN to contact -monitor sodium; needs volume off  -corrected hyperglycemia  Acute encephalopathy, suspect post-ictal confusion + benzo side effect: Present on arrival; improved.  -HD per nephro -renally dose meds -seizure precautions -Discussed post-seizure safety concerns. She is aware she cannot drive x 6 months nor should he bathe/ swim alone or operate heavy machinery.   Hyponatremia, hypervolemic -daily renal function panel -avoid hypotonic fluids -needs more volume off  Hyperglycemia without DKA -SSI PRN -diabetes coordinator recommended against long-acting insulin  -goal of 140-180 will be very challenging to achieve  Lactic acidosis, initially suspected due to seizure, but no good explanation for rise since then. Downtrending.   Transaminase election, large volume of ascites likely due to congestion from inadequate volume control chronically. Hepatitis panel negative, neg acetaminophen  and salicylates.  RUQ not concerning for cholecystitis. -needs volume off  Hypothyroidism; new diagnosis -low dose synthroid  -needs OP follow up  Possible bowel obstruction (very low suspicion) vs gastroparesis -monitor clinically  Fever- suspect  this is due to seizure -follow cultures  ESRD on hemodialysis -iHD per nephrology -renally dose meds -renal diet  Uncontrolled hypertension: -con't PTA labetalol , amlodipine  added this admission  Anemia and thrombocytopenia- both chronic  -monitor, transfuse for Hb <7 or hemodynamically significant bleeding  Lactic acidosis, suspect slow clearance due to liver dysfunctin, downtrending -no additional monitoring needed  Hyperlipidemia -con't statin  GERD, likely esophageal spasms with cold liquids -PPI -avoid cold liquids   Best Practice (right click and Reselect all SmartList Selections daily)   Diet/type: Regular consistency (see orders)  DVT prophylaxis prophylactic heparin   Pressure ulcer(s): N/A GI prophylaxis: N/A Lines: N/A Foley:  N/A Code Status:  full code Last date of multidisciplinary goals of care discussion [with father 1/12]  Labs   CBC: Recent Labs  Lab 06/12/23 2101 06/12/23 2130 06/12/23 2201 06/13/23 0957 06/14/23 0817  WBC 2.3*  --   --  6.5 4.3  NEUTROABS 1.3*  --   --  5.5  --   HGB 9.7* 12.9  12.9 10.2* 8.5* 7.9*  HCT 34.6* 38.0  38.0 30.0* 27.2* 25.1*  MCV 96.9  --   --  87.5 86.9  PLT 85*  --   --  75* 74*    Basic Metabolic Panel: Recent Labs  Lab 06/12/23 2101 06/12/23 2130 06/12/23 2201  06/13/23 0226 06/13/23 0630 06/13/23 0955 06/13/23 1913 06/14/23 0815  NA 120* 122*  122* 122*  --  127* 125* 123* 122*  K 3.8 3.9  3.9 4.2  --  3.2* 2.9* 2.7* 3.5  CL 84* 85*  --   --  89* 88* 90* 89*  CO2 23  --   --   --  22 22 24 24   GLUCOSE 1,094* >700*  --   --  605* 369* 32* 123*  BUN 13 15  --   --  14 15 16 16   CREATININE 4.88* 4.90*  --   --  5.00* 5.12* 5.27* 5.41*  CALCIUM  7.8*  --   --   --  7.5* 7.8* 7.4* 7.3*  MG  --   --   --  1.6* 1.6*  --   --   --   PHOS  --   --   --  2.5 2.2*  --   --  1.6*   GFR: Estimated Creatinine Clearance: 13.1 mL/min (A) (by C-G formula based on SCr of 5.41 mg/dL (H)). Recent Labs   Lab 06/12/23 2101 06/12/23 2130 06/13/23 0226 06/13/23 0630 06/13/23 0957 06/14/23 0817 06/14/23 0832  WBC 2.3*  --   --   --  6.5 4.3  --   LATICACIDVEN  --    < > 2.9* 4.3* 4.0*  --  1.2   < > = values in this interval not displayed.    Liver Function Tests: Recent Labs  Lab 06/12/23 2101 06/13/23 0630 06/14/23 0815  AST 388* 139* 76*  ALT 86* 56* 33  ALKPHOS 297* 217* 163*  BILITOT 0.9 1.0 1.1  PROT 7.9 6.8 5.6*  ALBUMIN  3.2* 2.7* 2.1*  2.1*   Recent Labs  Lab 06/12/23 2101 06/13/23 0226  LIPASE 25  --   AMYLASE  --  27*      HbA1C: HbA1c POC (<> result, manual entry)  Date/Time Value Ref Range Status  09/10/2020 01:35 PM >15 4.0 - 5.6 % Final   Hgb A1c MFr Bld  Date/Time Value Ref Range Status  05/30/2023 01:12 AM >15.5 (H) 4.8 - 5.6 % Final    Comment:    (NOTE)         Prediabetes: 5.7 - 6.4         Diabetes: >6.4         Glycemic control for adults with diabetes: <7.0   02/08/2022 09:41 PM 14.3 (H) 4.8 - 5.6 % Final    Comment:    (NOTE) Pre diabetes:          5.7%-6.4%  Diabetes:              >6.4%  Glycemic control for   <7.0% adults with diabetes     CBG: Recent Labs  Lab 06/14/23 0103 06/14/23 0325 06/14/23 0401 06/14/23 0722 06/14/23 0752  GLUCAP 126* 55* 98 57* 150*    Critical care time:      [name], DO 06/14/23 3:58 PM [facility]  For contact information, see Amion. If no response to pager, please call PCCM consult pager. After hours, 7PM- 7AM, please call Elink.

## 2023-06-14 NOTE — Inpatient Diabetes Management (Signed)
 Inpatient Diabetes Program Recommendations  AACE/ADA: New Consensus Statement on Inpatient Glycemic Control (2015)  Target Ranges:  Prepandial:   less than 140 mg/dL      Peak postprandial:   less than 180 mg/dL (1-2 hours)      Critically ill patients:  140 - 180 mg/dL   Lab Results  Component Value Date   GLUCAP 150 (H) 06/14/2023   HGBA1C >15.5 (H) 05/30/2023    Review of Glycemic Control  Latest Reference Range & Units 06/13/23 12:55 06/13/23 13:51 06/13/23 14:54 06/13/23 15:32 06/13/23 16:20 06/13/23 19:38 06/13/23 19:40 06/13/23 20:14  Glucose-Capillary 70 - 99 mg/dL 847 (H)  IV insulin  0.9 units 126 (H)  Semglee  5 units  IV insulin  0.5 units 84 71 97 14 (LL) 18 (LL) 120 (H)  (LL): Data is critically low (H): Data is abnormally high  Latest Reference Range & Units 06/13/23 23:13 06/13/23 23:14 06/14/23 00:10 06/14/23 01:03 06/14/23 03:25 06/14/23 04:01 06/14/23 07:22 06/14/23 07:52  Glucose-Capillary 70 - 99 mg/dL <89 (LL) <89 (LL) 52 (L) 126 (H) 55 (L) 98 57 (L) 150 (H)  (LL): Data is critically low (L): Data is abnormally low (H): Data is abnormally high  Diabetes history: DM1 (does NOT make any insulin ; requires basal, correction, and carb coverage insulin ) Outpatient Diabetes medications: Lantus  3 units daily, Humalog  0-6 units TID with meals Current orders for Inpatient glycemic control:    Inpatient Diabetes Program Recommendations:    Discontinue Semglee  5 units while receiving Novolog  correction Q4   Please consider ordering custom scale Novolog  0-4 units Q6H CBG 70-200    0 units CBG 201-300  1 unit CBG 301-400   2 units CBG 401-500   3 units CBG >501        4 units    Will continue to follow while inpatient.  Thank you, Wyvonna Pinal, MSN, CDCES Diabetes Coordinator Inpatient Diabetes Program [phone] (team pager from 8a-5p)

## 2023-06-14 NOTE — Procedures (Signed)
 I was present at this dialysis session. I have reviewed the session itself and made appropriate changes.   Off insulin  gtt and CBGs up/down.    Last K was 2.7 on 4K bath.  UF goal of 3 L.  BPs mildly elevated  Hb 10s at admit to 7.9, No overt bleeding. Give ESA today and check Fe panel.     Filed Weights   06/13/23 0830 06/14/23 0500 06/14/23 0825  Weight: 49.4 kg 56.6 kg 56.6 kg    Recent Labs  Lab 06/13/23 0630 06/13/23 0955 06/13/23 1913  NA 127*   < > 123*  K 3.2*   < > 2.7*  CL 89*   < > 90*  CO2 22   < > 24  GLUCOSE 605*   < > 32*  BUN 14   < > 16  CREATININE 5.00*   < > 5.27*  CALCIUM  7.5*   < > 7.4*  PHOS 2.2*  --   --    < > = values in this interval not displayed.    Recent Labs  Lab 06/12/23 2101 06/12/23 2130 06/12/23 2201 06/13/23 0957  WBC 2.3*  --   --  6.5  NEUTROABS 1.3*  --   --  5.5  HGB 9.7* 12.9  12.9 10.2* 8.5*  HCT 34.6* 38.0  38.0 30.0* 27.2*  MCV 96.9  --   --  87.5  PLT 85*  --   --  75*    Scheduled Meds:  amLODipine   5 mg Oral Daily   Chlorhexidine  Gluconate Cloth  6 each Topical Daily   heparin  injection (subcutaneous)  5,000 Units Subcutaneous Q12H   insulin  aspart  1-3 Units Subcutaneous Q4H   insulin  glargine-yfgn  5 Units Subcutaneous Daily   labetalol   100 mg Oral BID   levothyroxine   25 mcg Oral Q0600   rosuvastatin   10 mg Oral Daily   Continuous Infusions:  dextrose  75 mL/hr at 06/14/23 0700   PRN Meds:.dextrose , diphenhydrAMINE , hydrALAZINE , labetalol , lidocaine  (PF), lidocaine -prilocaine , LORazepam , ondansetron  (ZOFRAN ) IV, oxyCODONE , pentafluoroprop-tetrafluoroeth   Bernardino Gasman  MD 06/14/2023, 9:24 AM

## 2023-06-14 NOTE — Progress Notes (Signed)
 Received patient in bed to unit.  Alert and oriented.  Informed consent signed and in chart.   TX duration:3 hours  Patient tolerated well.  Transported back to the room  Alert, without acute distress.  Hand-off given to patient's nurse.   Access used: Left Graft upper ARm Access issues: none  Total UF removed: 3L Medication(s) given: none   06/14/23 1147  Vitals  Temp 98.5 F (36.9 C)  Temp Source Oral  BP 99/60  MAP (mmHg) 70  BP Location Right Arm  BP Method Automatic  Patient Position (if appropriate) Lying  Pulse Rate Source Monitor  ECG Heart Rate 88  Resp 15  Oxygen Therapy  SpO2 100 %  O2 Device Room Air  During Treatment Monitoring  Duration of HD Treatment -hour(s) 3 hour(s)  HD Safety Checks Performed Yes  Intra-Hemodialysis Comments Tx completed;Tolerated well  Dialysis Fluid Bolus Normal Saline  Bolus Amount (mL) 300 mL  Post Treatment  Dialyzer Clearance Lightly streaked  Liters Processed 63  Fluid Removed (mL) 3000 mL  Tolerated HD Treatment Yes  AVG/AVF Arterial Site Held (minutes) 7 minutes  AVG/AVF Venous Site Held (minutes) 7 minutes  Fistula / Graft Left Upper arm Arteriovenous vein graft  Placement Date/Time: 06/19/21 0850   Placed prior to admission: No  Orientation: Left  Access Location: Upper arm  Access Type: Arteriovenous vein graft  Expiration Date: 02/15/26  Status Deaccessed     Camellia Brasil LPN [facility]

## 2023-06-14 NOTE — Progress Notes (Signed)
 Pt receives out-pt HD at [facility] on MWF. Will assist as needed.   Olivia Canter Renal Navigator [phone]

## 2023-06-14 NOTE — Progress Notes (Addendum)
 At 1940 pt CBG 14.  Gave pt 1 amp 50% Dextrose  and notified MD about concern of long acting insulin  due later in the night.  See orders.    2313 pt CBG < 10.  Gave pt 1 amp 50% Dextrose  and notified MD.  Upon recheck, CGB 52.  Another 1 amp 50% Dextrose  given to pt and dextrose  10% gtt started per MD orders.    0325 pt CBG 55.  Gave 1.2 amp 50% Dextrose  and notified MD.  Dextrose  gtt increased per order.

## 2023-06-14 NOTE — Plan of Care (Signed)
  Problem: Coping: Goal: Ability to adjust to condition or change in health will improve Outcome: Progressing   Problem: Fluid Volume: Goal: Ability to maintain a balanced intake and output will improve Outcome: Progressing   Problem: Metabolic: Goal: Ability to maintain appropriate glucose levels will improve Outcome: Progressing   Problem: Nutritional: Goal: Maintenance of adequate nutrition will improve Outcome: Progressing Goal: Progress toward achieving an optimal weight will improve Outcome: Progressing   Problem: Skin Integrity: Goal: Risk for impaired skin integrity will decrease Outcome: Progressing   Problem: Tissue Perfusion: Goal: Adequacy of tissue perfusion will improve Outcome: Progressing

## 2023-06-14 NOTE — Progress Notes (Signed)
 Critical lab value: Potassium 2.7.  Notified Elink.  See new orders

## 2023-06-15 ENCOUNTER — Inpatient Hospital Stay (HOSPITAL_COMMUNITY): Payer: Medicare HMO

## 2023-06-15 DIAGNOSIS — R569 Unspecified convulsions: Secondary | ICD-10-CM | POA: Diagnosis not present

## 2023-06-15 LAB — RENAL FUNCTION PANEL
Albumin: 2.3 g/dL — ABNORMAL LOW (ref 3.5–5.0)
Anion gap: 8 (ref 5–15)
BUN: 12 mg/dL (ref 6–20)
CO2: 25 mmol/L (ref 22–32)
Calcium: 7.3 mg/dL — ABNORMAL LOW (ref 8.9–10.3)
Chloride: 92 mmol/L — ABNORMAL LOW (ref 98–111)
Creatinine, Ser: 4.09 mg/dL — ABNORMAL HIGH (ref 0.44–1.00)
GFR, Estimated: 14 mL/min — ABNORMAL LOW (ref >60)
Glucose, Bld: 216 mg/dL — ABNORMAL HIGH (ref 70–99)
Phosphorus: 2.1 mg/dL — ABNORMAL LOW (ref 2.5–4.6)
Potassium: 4.5 mmol/L (ref 3.5–5.1)
Sodium: 125 mmol/L — ABNORMAL LOW (ref 135–145)

## 2023-06-15 LAB — GLUCOSE, CAPILLARY
Glucose-Capillary: 118 mg/dL — ABNORMAL HIGH (ref 70–99)
Glucose-Capillary: 150 mg/dL — ABNORMAL HIGH (ref 70–99)
Glucose-Capillary: 163 mg/dL — ABNORMAL HIGH (ref 70–99)
Glucose-Capillary: 166 mg/dL — ABNORMAL HIGH (ref 70–99)
Glucose-Capillary: 168 mg/dL — ABNORMAL HIGH (ref 70–99)
Glucose-Capillary: 172 mg/dL — ABNORMAL HIGH (ref 70–99)
Glucose-Capillary: 206 mg/dL — ABNORMAL HIGH (ref 70–99)

## 2023-06-15 MED ORDER — LORAZEPAM 2 MG/ML IJ SOLN
1.0000 mg | Freq: Once | INTRAMUSCULAR | Status: AC
  Administered 2023-06-15: 1 mg via INTRAVENOUS
  Filled 2023-06-15: qty 1

## 2023-06-15 MED ORDER — SODIUM PHOSPHATES 45 MMOLE/15ML IV SOLN
30.0000 mmol | Freq: Once | INTRAVENOUS | Status: AC
  Administered 2023-06-15: 30 mmol via INTRAVENOUS
  Filled 2023-06-15: qty 10

## 2023-06-15 MED ORDER — OXYCODONE HCL 5 MG PO TABS
5.0000 mg | ORAL_TABLET | Freq: Once | ORAL | Status: AC
Start: 2023-06-15 — End: 2023-06-15
  Administered 2023-06-15: 5 mg via ORAL
  Filled 2023-06-15: qty 1

## 2023-06-15 NOTE — Progress Notes (Signed)
 PROGRESS NOTE    Ann Robertson  ZOX:[id number] DOB: 06/30/1990 DOA: 06/12/2023 PCP: Janifer Meigs, FNP   Brief Narrative:  This 33 years old female with medical non-adherence, ESRD on hemodialysis, poorly controlled IDDM, Neuropathy, tobacco abuse presented in the ED with altered mental status and seizures. Patient was initially admitted in ICU.  Patient was found to have hyperglycemia w/o DKA,  NG tube was placed for decompression. She was also given a dose of IV Keppra  in the ED.  Patient was also started on empiric antibiotics given sepsis.  Neurology was consulted,  stopped Keppra  since seizure was secondary to hypoglycemia.  Brain MRI is pending.  TRH Pick up 06/15/2023.   Assessment & Plan:   Principal Problem:   Seizure (HCC)  New onset Seizures: Patient has multiple potential causes for seizures ( hyponatremia, hyperglycemia and uncontrolled hypertension) Appreciated neurology recommendation. Keppra  stopped since this seizure was secondary to hyperglycemia. Brain MRI pending;  Monitor sodium, blood glucose.  Acute encephalopathy: > Improved. Suspect post-ictal confusion + benzo side effect: POA now resolved. Continue hemodialysis as per nephrology Renally dose medications. Seizure precautions Discussed post-seizure safety concerns.  She is aware she cannot drive x 6 months nor should he bathe / swim alone or operate heavy machinery.   Hyponatremia, hypervolemic: Monitor renal function. Avoid hypotonic fluids Follow-up hemodialysis.   Hyperglycemia without DKA: Continue SSI PRN Diabetes coordinator recommended against long-acting insulin  Goal of 140-180 will be very challenging to achieve.   Lactic acidosis: Initially suspected due to seizure, but no good explanation for rise since then. Downtrending.    Elevated liver enzymes: Large volume of ascites likely due to congestion from inadequate volume control chronically.  Hepatitis panel negative,  neg acetaminophen  and salicylates.   RUQ not concerning for cholecystitis. -needs volume off   Hypothyroidism: Low dose synthroid  Needs OP follow up   Possible bowel obstruction (very low suspicion) vs gastroparesis: -monitor clinically   Fever- suspect this is due to seizure Follow up cultures.   ESRD on hemodialysis: -iHD per nephrology -renally dose meds -renal diet   Uncontrolled hypertension: Continue PTA labetalol , amlodipine .   Anemia and thrombocytopenia- both chronic  -monitor, transfuse for Hb <7 or hemodynamically significant bleeding.   Hyperlipidemia Continue statin   GERD, likely esophageal spasms with cold liquids. -PPI -avoid cold liquids    DVT prophylaxis: Heparin  Code Status: Full code Family Communication: No family at bedside Disposition Plan:     Status is: Inpatient Remains inpatient appropriate because: Severity of illness     Consultants:   Neurology  Procedures:   Antimicrobials:  Anti-infectives (From admission, onward)    Start     Dose/Rate Route Frequency Ordered Stop   06/12/23 2200  ceFEPIme  (MAXIPIME ) 2 g in sodium chloride  0.9 % 100 mL IVPB        2 g 200 mL/hr over 30 Minutes Intravenous  Once 06/12/23 2151 06/12/23 2253   06/12/23 2200  metroNIDAZOLE  (FLAGYL ) IVPB 500 mg        500 mg 100 mL/hr over 60 Minutes Intravenous  Once 06/12/23 2151 06/13/23 0011   06/12/23 2200  vancomycin  (VANCOCIN ) IVPB 1000 mg/200 mL premix        1,000 mg 200 mL/hr over 60 Minutes Intravenous  Once 06/12/23 2151 06/13/23 0011      Subjective: Patient was seen and examined at bedside.Overnight events noted.   Patient complains of having itching.  Patient denies any other concerns.  Objective: Vitals:   06/14/23 2010 06/15/23  0451 06/15/23 0511 06/15/23 0809  BP: (!) 161/80  123/82 (!) 154/88  Pulse: 94  79 75  Resp: 18  18   Temp: 98 F (36.7 C)  98.1 F (36.7 C) (!) 97.3 F (36.3 C)  TempSrc: Oral     SpO2: 100%  100% 100%   Weight:  53.7 kg    Height:        Intake/Output Summary (Last 24 hours) at 06/15/2023 1343 Last data filed at 06/15/2023 0809 Gross per 24 hour  Intake 118 ml  Output 0 ml  Net 118 ml   Filed Weights   06/14/23 0825 06/14/23 1208 06/15/23 0451  Weight: 56.6 kg 53.6 kg 53.7 kg    Examination:  General exam: Appears calm , comfortable, sick looking, deconditioned, frail, not in any acute distress Respiratory system: Clear to auscultation. Respiratory effort normal. RR 16 Cardiovascular system: S1 & S2 heard, RRR. No JVD, murmurs, rubs, gallops or clicks.  Gastrointestinal system: Abdomen is non distended, soft and non tender. Normal bowel sounds heard. Central nervous system: Alert and oriented X 3. No focal neurological deficits. Extremities: No edema, no cyanosis, no clubbing Skin: No rashes, lesions or ulcers Psychiatry: Judgement and insight appear normal. Mood & affect appropriate.     Data Reviewed: I have personally reviewed following labs and imaging studies  CBC: Recent Labs  Lab 06/12/23 2101 06/12/23 2130 06/12/23 2201 06/13/23 0957 06/14/23 0817  WBC 2.3*  --   --  6.5 4.3  NEUTROABS 1.3*  --   --  5.5  --   HGB 9.7* 12.9  12.9 10.2* 8.5* 7.9*  HCT 34.6* 38.0  38.0 30.0* 27.2* 25.1*  MCV 96.9  --   --  87.5 86.9  PLT 85*  --   --  75* 74*   Basic Metabolic Panel: Recent Labs  Lab 06/13/23 0226 06/13/23 0630 06/13/23 0955 06/13/23 1913 06/14/23 0815 06/15/23 0853  NA  --  127* 125* 123* 122* 125*  K  --  3.2* 2.9* 2.7* 3.5 4.5  CL  --  89* 88* 90* 89* 92*  CO2  --  22 22 24 24 25   GLUCOSE  --  605* 369* 32* 123* 216*  BUN  --  14 15 16 16 12   CREATININE  --  5.00* 5.12* 5.27* 5.41* 4.09*  CALCIUM   --  7.5* 7.8* 7.4* 7.3* 7.3*  MG 1.6* 1.6*  --   --   --   --   PHOS 2.5 2.2*  --   --  1.6* 2.1*   GFR: Estimated Creatinine Clearance: 16.4 mL/min (A) (by C-G formula based on SCr of 4.09 mg/dL (H)). Liver Function Tests: Recent Labs  Lab  06/12/23 2101 06/13/23 0630 06/14/23 0815 06/15/23 0853  AST 388* 139* 76*  --   ALT 86* 56* 33  --   ALKPHOS 297* 217* 163*  --   BILITOT 0.9 1.0 1.1  --   PROT 7.9 6.8 5.6*  --   ALBUMIN  3.2* 2.7* 2.1*  2.1* 2.3*   Recent Labs  Lab 06/12/23 2101 06/13/23 0226  LIPASE 25  --   AMYLASE  --  27*   No results for input(s): "AMMONIA" in the last 168 hours. Coagulation Profile: No results for input(s): "INR", "PROTIME" in the last 168 hours. Cardiac Enzymes: Recent Labs  Lab 06/13/23 0226  CKTOTAL 45   BNP (last 3 results) No results for input(s): "PROBNP" in the last 8760 hours. HbA1C: No results for input(s): "  HGBA1C" in the last 72 hours. CBG: Recent Labs  Lab 06/14/23 1523 06/14/23 2238 06/15/23 0132 06/15/23 0521 06/15/23 0812  GLUCAP 119* 144* 172* 166* 206*   Lipid Profile: No results for input(s): "CHOL", "HDL", "LDLCALC", "TRIG", "CHOLHDL", "LDLDIRECT" in the last 72 hours. Thyroid  Function Tests: Recent Labs    06/13/23 0226  TSH 15.011*  FREET4 0.83   Anemia Panel: Recent Labs    06/13/23 0225 06/13/23 0226  VITAMINB12  --  1,304*  FERRITIN 1,492*  --   TIBC 126*  --   IRON 117  --    Sepsis Labs: Recent Labs  Lab 06/13/23 0226 06/13/23 0630 06/13/23 0957 06/14/23 0832  LATICACIDVEN 2.9* 4.3* 4.0* 1.2    Recent Results (from the past 240 hours)  MRSA Next Gen by PCR, Nasal     Status: None   Collection Time: 06/13/23  1:22 AM   Specimen: Nasal Mucosa; Nasal Swab  Result Value Ref Range Status   MRSA by PCR Next Gen NOT DETECTED NOT DETECTED Final    Comment: (NOTE) The GeneXpert MRSA Assay (FDA approved for NASAL specimens only), is one component of a comprehensive MRSA colonization surveillance program. It is not intended to diagnose MRSA infection nor to guide or monitor treatment for MRSA infections. Test performance is not FDA approved in patients less than 41 years old. Performed at [facility], [address], [place], [place] [address]   Culture, blood (Routine X 2) w Reflex to ID Panel     Status: None (Preliminary result)   Collection Time: 06/13/23  2:26 AM   Specimen: BLOOD  Result Value Ref Range Status   Specimen Description BLOOD BLOOD RIGHT ARM  Final   Special Requests   Final    BOTTLES DRAWN AEROBIC ONLY Blood Culture results may not be optimal due to an inadequate volume of blood received in culture bottles   Culture   Final    NO GROWTH 2 DAYS Performed at [facility], [address], [place], [place] [address]    Report Status PENDING  Incomplete  Culture, blood (Routine X 2) w Reflex to ID Panel     Status: None (Preliminary result)   Collection Time: 06/13/23  2:26 AM   Specimen: BLOOD  Result Value Ref Range Status   Specimen Description BLOOD BLOOD RIGHT HAND  Final   Special Requests   Final    BOTTLES DRAWN AEROBIC AND ANAEROBIC Blood Culture results may not be optimal due to an inadequate volume of blood received in culture bottles   Culture   Final    NO GROWTH 2 DAYS Performed at [facility], [address], [place], [place] [address]    Report Status PENDING  Incomplete    Radiology Studies: No results found.  Scheduled Meds:  amLODipine   5 mg Oral Daily   Chlorhexidine  Gluconate Cloth  6 each Topical Daily   darbepoetin (ARANESP ) injection - DIALYSIS  60 mcg Subcutaneous Q Tue-1800   heparin  injection (subcutaneous)  5,000 Units Subcutaneous Q12H   insulin  aspart  1-3 Units Subcutaneous Q4H   labetalol   100 mg Oral BID   levothyroxine   25 mcg Oral Q0600   pantoprazole   40 mg Oral Daily   rosuvastatin   10 mg Oral Daily   Continuous Infusions:  sodium phosphate  30 mmol in dextrose  5 % 250 mL infusion Stopped (06/15/23 1157)     LOS: 3 days    Time spent: 50 mins  Magdalene School, MD [facility]   If 7PM-7AM, please contact night-coverage

## 2023-06-15 NOTE — Progress Notes (Signed)
 [facility] Progress Note   Subjective:  Completed dialysis yesterday - net UF 3L.  On continuous dextrose  drip this am. CBGs 160s-200 this am Tolerated dialysis well. No cp, sob. C/o all over itching and difficulty swallowing/mouth pain   Objective Vitals:   06/14/23 2010 06/15/23 0451 06/15/23 0511 06/15/23 0809  BP: (!) 161/80  123/82 (!) 154/88  Pulse: 94  79 75  Resp: 18  18   Temp: 98 F (36.7 C)  98.1 F (36.7 C) (!) 97.3 F (36.3 C)  TempSrc: Oral     SpO2: 100%  100% 100%  Weight:  53.7 kg    Height:         Additional Objective Labs: Basic Metabolic Panel: Recent Labs  Lab 06/13/23 0630 06/13/23 0955 06/13/23 1913 06/14/23 0815 06/15/23 0853  NA 127*   < > 123* 122* 125*  K 3.2*   < > 2.7* 3.5 4.5  CL 89*   < > 90* 89* 92*  CO2 22   < > 24 24 25   GLUCOSE 605*   < > 32* 123* 216*  BUN 14   < > 16 16 12   CREATININE 5.00*   < > 5.27* 5.41* 4.09*  CALCIUM  7.5*   < > 7.4* 7.3* 7.3*  PHOS 2.2*  --   --  1.6* 2.1*   < > = values in this interval not displayed.   CBC: Recent Labs  Lab 06/12/23 2101 06/12/23 2130 06/12/23 2201 06/13/23 0957 06/14/23 0817  WBC 2.3*  --   --  6.5 4.3  NEUTROABS 1.3*  --   --  5.5  --   HGB 9.7*   < > 10.2* 8.5* 7.9*  HCT 34.6*   < > 30.0* 27.2* 25.1*  MCV 96.9  --   --  87.5 86.9  PLT 85*  --   --  75* 74*   < > = values in this interval not displayed.   Blood Culture    Component Value Date/Time   SDES BLOOD BLOOD RIGHT ARM 06/13/2023 0226   SDES BLOOD BLOOD RIGHT HAND 06/13/2023 0226   SPECREQUEST  06/13/2023 0226    BOTTLES DRAWN AEROBIC ONLY Blood Culture results may not be optimal due to an inadequate volume of blood received in culture bottles   SPECREQUEST  06/13/2023 0226    BOTTLES DRAWN AEROBIC AND ANAEROBIC Blood Culture results may not be optimal due to an inadequate volume of blood received in culture bottles   CULT  06/13/2023 0226    NO GROWTH 2 DAYS Performed at [facility]  Lab, [address], [place], [place] [address]    CULT  06/13/2023 0226    NO GROWTH 2 DAYS Performed at [facility], [address] [place], [place] [address]    REPTSTATUS PENDING 06/13/2023 0226   REPTSTATUS PENDING 06/13/2023 0226     Physical Exam General: Alert, lying in bed Heart: RRR Lungs: Clear  Abdomen: soft non-tender  Extremities: No sig LE edema  Dialysis Access: LUE AVG +bruit   Medications:  dextrose  75 mL/hr at 06/15/23 0647   sodium phosphate  30 mmol in dextrose  5 % 250 mL infusion 30 mmol (06/15/23 1043)    amLODipine   5 mg Oral Daily   Chlorhexidine  Gluconate Cloth  6 each Topical Daily   darbepoetin (ARANESP ) injection - DIALYSIS  60 mcg Subcutaneous Q Tue-1800   heparin  injection (subcutaneous)  5,000 Units Subcutaneous Q12H   insulin  aspart  1-3  Units Subcutaneous Q4H   labetalol   100 mg Oral BID   levothyroxine   25 mcg Oral Q0600   LORazepam   1 mg Intravenous Once   pantoprazole   40 mg Oral Daily   rosuvastatin   10 mg Oral Daily    Outpt HD: [facility] M[facility] 4h, BFR 400 DFR a1.5, 2K 3Ca bath usign LUE AVG. F180. Mircera last dose 12/24 50mcg, C3 0.25mcg three times per week. Last HD 06/10/23 left 4kg above EDW.   Assessment/Plan: 1. DMT1/Hyperglycemia - management per primary team. D/c continuous dextrose  today.  2. ESRD - HD MWF. Off schedule this week d/t high inpatient census. Had HD Tues. Plan for next HD Thurs.  3. HTN/volume- Chronic volume overload with HTN. UF with HD as able.  4. Anemia- Hgb 7.9 Aranesp  60  mcg given 1/14 5. 2HPTH-  CorrCa acceptable. Phos on low side. Pharmacy supplementing. 6. Pseudohyponatremia - in the setting of hyperglycemia. Hypervolemia also a factor  7. Seizure activity - neuro following. EEG w diffuse encephalopathy. For brain MRI.  7. Nutrition - Renal diet/fluid restriction. Add prot supp for low albumin    Elona Hal PA-C [facility] 06/15/2023,11:34 AM

## 2023-06-15 NOTE — Care Management Important Message (Signed)
 Important Message  Patient Details  Name: Orna Lusch MRN: [id number] Date of Birth: November 21, 1990   Important Message Given:  Yes - Medicare IM     Wynonia Hedges 06/15/2023, 3:58 PM

## 2023-06-15 NOTE — Evaluation (Signed)
 Occupational Therapy Evaluation Patient Details Name: Ann Robertson MRN: [id number] DOB: 04/29/1991 Today's Date: 06/15/2023   History of Present Illness Patient admitted on 06/12/2023 after tonic-clonic seizure at home which is new w/o any prior hx of seizure disorder;  Additional imaging includes CT Abd/Pelvis and RUQ US  both demonstrated large volume ascites; Patient with ESRD on hemodialysis MWF, uncontrolled HTN, and diabetes.  Patient just discharged from recent admission on 06/02/2023, and at that time was living with ex-husband and children.   Clinical Impression   Pt admitted for above, she presents as frail and generally weak. Pt needing strong max encouragement to engage in standing, will get overstimulate with too much stimuli and needs to go at her own pace. She currently needs mod A for standing efforts and transfers, as well as  mod A to setup for ADL assist. During OOB pt only able to take 2 steps, talked with pt's family about the need for more activity at home. OT to continue following pt acutely to address listed deficits and help transition to next level of care. Patient would benefit from post acute Home OT services to help maximize functional independence in natural environment, may benefit from aquatic therapy if open to it.       If plan is discharge home, recommend the following: A little help with walking and/or transfers;A little help with bathing/dressing/bathroom;Assistance with cooking/housework;Assist for transportation    Functional Status Assessment  Patient has had a recent decline in their functional status and demonstrates the ability to make significant improvements in function in a reasonable and predictable amount of time.  Equipment Recommendations  Tub/shower bench    Recommendations for Other Services       Precautions / Restrictions Precautions Precautions: Fall Restrictions Weight Bearing Restrictions Per Provider Order: No       Mobility Bed Mobility Overal bed mobility: Needs Assistance Bed Mobility: Supine to Sit     Supine to sit: Mod assist, HOB elevated, Used rails     General bed mobility comments: pt requested to sit EOB at end of session    Transfers Overall transfer level: Needs assistance Equipment used: 1 person hand held assist Transfers: Sit to/from Stand Sit to Stand: Mod assist           General transfer comment: very limited, able to take 2 steps forward before needing to sit back down      Balance Overall balance assessment: Needs assistance Sitting-balance support: No upper extremity supported, Feet supported Sitting balance-Leahy Scale: Fair Sitting balance - Comments: sitting EOB   Standing balance support: Single extremity supported Standing balance-Leahy Scale: Poor Standing balance comment: Needs UE support                           ADL either performed or assessed with clinical judgement   ADL Overall ADL's : Needs assistance/impaired Eating/Feeding: Independent;Sitting   Grooming: Sitting;Set up   Upper Body Bathing: Set up;Sitting   Lower Body Bathing: Set up;Sitting/lateral leans   Upper Body Dressing : Sitting;Set up   Lower Body Dressing: Moderate assistance;Sitting/lateral leans   Toilet Transfer: Moderate assistance;Stand-pivot;BSC/3in1   Toileting- Clothing Manipulation and Hygiene: Sitting/lateral lean;Minimal assistance               Vision         Perception         Praxis         Pertinent Vitals/Pain Pain Assessment Pain Assessment: No/denies  pain     Extremity/Trunk Assessment Upper Extremity Assessment Upper Extremity Assessment: Generalized weakness   Lower Extremity Assessment Lower Extremity Assessment: Generalized weakness (peripheral neuropathy at baseline)   Cervical / Trunk Assessment Cervical / Trunk Assessment: Normal   Communication Communication Communication: No apparent  difficulties Cueing Techniques: Verbal cues   Cognition Arousal: Lethargic Behavior During Therapy: Anxious (overwhelmed) Overall Cognitive Status: No family/caregiver present to determine baseline cognitive functioning                                 General Comments: Pt gets overwhelmed with too much stimuli (MD entering room after therapist, interrupting session). Pt also noted to get distracted fairly easily, needs consistent redirection but follows commands with increased time as well.     General Comments       Exercises     Shoulder Instructions      Home Living Family/patient expects to be discharged to:: Private residence Living Arrangements: Children;Parent Available Help at Discharge: Family Type of Home: House Home Access: Ramped entrance     Home Layout: One level     Bathroom Shower/Tub: Chief Strategy Officer: Standard     Home Equipment: Agricultural consultant (2 wheels);Cane - single point   Additional Comments: Home setup obtained from prior therapy sessions      Prior Functioning/Environment Prior Level of Function : Needs assist             Mobility Comments: w/c level, pivots into it with assist of son. Father propels the pt ADLs Comments: assist with getting in/out shower. ind other ADLs        OT Problem List: Decreased activity tolerance;Decreased strength;Impaired balance (sitting and/or standing);Impaired sensation      OT Treatment/Interventions: Self-care/ADL training;DME and/or AE instruction;Therapeutic activities;Balance training;Therapeutic exercise;Patient/family education    OT Goals(Current goals can be found in the care plan section) Acute Rehab OT Goals Patient Stated Goal: To get additional therapy OT Goal Formulation: With family Time For Goal Achievement: 06/29/23 Potential to Achieve Goals: Good ADL Goals Pt Will Perform Grooming: standing;with contact guard assist Pt Will Perform Lower Body  Dressing: sit to/from stand;with supervision Pt Will Transfer to Toilet: with supervision;stand pivot transfer;bedside commode Pt/caregiver will Perform Home Exercise Program: Increased strength;Both right and left upper extremity;With theraband;Independently;With written HEP provided Additional ADL Goal #2: Pt will perform >3 mins OOB activity with CGA to demonstrated improved activity tolerance  OT Frequency: Min 1X/week    Co-evaluation              AM-PAC OT "6 Clicks" Daily Activity     Outcome Measure Help from another person eating meals?: None Help from another person taking care of personal grooming?: A Little Help from another person toileting, which includes using toliet, bedpan, or urinal?: A Lot Help from another person bathing (including washing, rinsing, drying)?: A Little Help from another person to put on and taking off regular upper body clothing?: A Little Help from another person to put on and taking off regular lower body clothing?: A Little 6 Click Score: 18   End of Session Equipment Utilized During Treatment: Gait belt Nurse Communication: Mobility status  Activity Tolerance: Patient tolerated treatment well Patient left: in bed;with call bell/phone within reach (sitting EOB, requested RN. Notified NT of pt needs)  OT Visit Diagnosis: Muscle weakness (generalized) (M62.81)  Time: 1610-9604 OT Time Calculation (min): 21 min Charges:  OT General Charges $OT Visit: 1 Visit OT Evaluation $OT Eval Moderate Complexity: 1 Mod  06/15/2023  AB, OTR/L  Acute Rehabilitation Services  Office: [phone]   Jorene New 06/15/2023, 12:23 PM

## 2023-06-15 NOTE — Progress Notes (Signed)
 EEG and MRI are relatively unremarkable.  I suspect that her presentation was due to hyperglycemia.  Neurology will be available as needed, please call if further questions or concerns.   Ann Keto, MD [facility] [phone]  If 7pm- 7am, please page neurology on call as listed in AMION.

## 2023-06-15 NOTE — Progress Notes (Signed)
 PT Cancellation Note  Patient Details Name: Aritha Mostyn MRN: [id number] DOB: 01/01/1991   Cancelled Treatment:    Reason Eval/Treat Not Completed: Patient at procedure or test/unavailable Readying to go to MRI;   Will follow up later today as time allows;  Otherwise, will follow up for PT tomorrow;   Thank you,  Darcus Eastern, PT  Acute Rehabilitation Services Office [phone]    Marcial Setting 06/15/2023, 11:36 AM

## 2023-06-16 DIAGNOSIS — R569 Unspecified convulsions: Secondary | ICD-10-CM | POA: Diagnosis not present

## 2023-06-16 LAB — GLUCOSE, CAPILLARY
Glucose-Capillary: 69 mg/dL — ABNORMAL LOW (ref 70–99)
Glucose-Capillary: 54 mg/dL — ABNORMAL LOW (ref 70–99)
Glucose-Capillary: 123 mg/dL — ABNORMAL HIGH (ref 70–99)
Glucose-Capillary: 129 mg/dL — ABNORMAL HIGH (ref 70–99)
Glucose-Capillary: 265 mg/dL — ABNORMAL HIGH (ref 70–99)

## 2023-06-16 MED ORDER — LIDOCAINE-PRILOCAINE 2.5-2.5 % EX CREA: 1.0000 | TOPICAL_CREAM | CUTANEOUS | Status: DC | PRN

## 2023-06-16 MED ORDER — ALTEPLASE 2 MG IJ SOLR: 2.0000 mg | Freq: Once | INTRAMUSCULAR | Status: DC | PRN

## 2023-06-16 MED ORDER — LIDOCAINE HCL (PF) 1 % IJ SOLN: 5.0000 mL | INTRAMUSCULAR | Status: DC | PRN

## 2023-06-16 MED ORDER — HEPARIN SODIUM (PORCINE) 1000 UNIT/ML DIALYSIS: 1000.0000 [IU] | INTRAMUSCULAR | Status: DC | PRN

## 2023-06-16 MED ORDER — ANTICOAGULANT SODIUM CITRATE 4% (200MG/5ML) IV SOLN: 5.0000 mL | Status: DC | PRN

## 2023-06-16 MED ORDER — PENTAFLUOROPROP-TETRAFLUOROETH EX AERO: 1.0000 | INHALATION_SPRAY | CUTANEOUS | Status: DC | PRN

## 2023-06-16 MED ORDER — ORAL CARE MOUTH RINSE: 15.0000 mL | OROMUCOSAL | Status: DC | PRN

## 2023-06-16 NOTE — Progress Notes (Signed)
Received patient in bed to unit.  Alert and oriented.  Informed consent signed and in chart.   TX duration:3  Patient tolerated well.  Transported back to the room  Alert, without acute distress.  Hand-off given to patient's nurse.   Access used: left AVG Access issues: none  Total UF removed: 3 Medication(s) given: zofran, oxy   06/16/23 1753  Vitals  Temp 97.8 F (36.6 C)  Temp Source Oral  BP (!) 169/96  MAP (mmHg) 117  BP Location Right Leg  BP Method Automatic  Patient Position (if appropriate) Lying  Pulse Rate 80  Pulse Rate Source Monitor  ECG Heart Rate 80  Resp 13  Oxygen Therapy  SpO2 100 %  O2 Device Room Air  During Treatment Monitoring  Blood Flow Rate (mL/min) 200 mL/min  Arterial Pressure (mmHg) -97.77 mmHg  Venous Pressure (mmHg) 153.32 mmHg  TMP (mmHg) 15.96 mmHg  Ultrafiltration Rate (mL/min) 1101 mL/min  Dialysate Flow Rate (mL/min) 300 ml/min  Duration of HD Treatment -hour(s) 3 hour(s)  Cumulative Fluid Removed (mL) per Treatment  3000.13      Jerett Odonohue [facility]

## 2023-06-16 NOTE — Progress Notes (Signed)
PROGRESS NOTE    Ann Robertson  WUJ:[id number] DOB: June 10, 1990 DOA: 06/12/2023 PCP: Felix Pacini, FNP   Brief Narrative:  This 33 years old female with medical non-adherence, ESRD on hemodialysis, poorly controlled IDDM, Neuropathy, tobacco abuse presented in the ED with altered mental status and seizures. Patient was initially admitted in ICU.  Patient was found to have hyperglycemia w/o DKA,  NG tube was placed for decompression. She was also given a dose of IV Keppra in the ED.  Patient was also started on empiric antibiotics given sepsis.  Neurology was consulted,  stopped Keppra since seizure was secondary to hypoglycemia.  Brain MRI is pending.  TRH Pick up 06/15/2023.   Assessment & Plan:   Principal Problem:   Seizure (HCC)  New onset Seizures: Patient has multiple potential causes for seizures (hyponatremia, hyperglycemia and uncontrolled hypertension) Appreciated neurology recommendation. Keppra stopped since this seizure was secondary to hyperglycemia. Brain MRI and EEG unremarkable. Monitor sodium, blood glucose.  Neurology signed off.  Acute encephalopathy: > Improved. Suspect post-ictal confusion + benzo side effect: POA now resolved. Continue hemodialysis as per nephrology Renally dose medications. Seizure precautions Discussed post-seizure safety concerns.  She is aware she cannot drive x 6 months nor should he bathe / swim alone or operate heavy machinery.   Hyponatremia, hypervolemic: Monitor renal function. Avoid hypotonic fluids. Follow-up hemodialysis.   Hyperglycemia without DKA: Continue SSI PRN Diabetes coordinator recommended against long-acting insulin Goal of 140-180 will be very challenging to achieve.   Lactic acidosis: Initially suspected due to seizure, but no good explanation for rise since then. Downtrending.    Elevated liver enzymes: Large volume of ascites likely due to congestion from inadequate volume control  chronically.  Hepatitis panel negative, neg acetaminophen and salicylates.   RUQ not concerning for cholecystitis. -needs volume off   Hypothyroidism: Continue Low dose synthroid. Needs OP follow up.   Possible bowel obstruction (very low suspicion) vs gastroparesis: -monitor clinically, resolved, tolerating renal diet.   Fever- suspect this is due to seizure Blood cultures no growth for 3 days.   ESRD on hemodialysis: -iHD per nephrology -renally dose meds -renal diet   Uncontrolled hypertension: Continue PTA labetalol, amlodipine.   Anemia and thrombocytopenia- both chronic  -monitor, transfuse for Hb <7 or hemodynamically significant bleeding.   Hyperlipidemia Continue statin.   GERD, likely esophageal spasms with cold liquids. -PPI -avoid cold liquids    DVT prophylaxis: Heparin Code Status: Full code Family Communication: No family at bedside Disposition Plan:     Status is: Inpatient Remains inpatient appropriate because: Severity of illness     Consultants:   Neurology  Procedures:   Antimicrobials:  Anti-infectives (From admission, onward)    Start     Dose/Rate Route Frequency Ordered Stop   06/12/23 2200  ceFEPIme (MAXIPIME) 2 g in sodium chloride 0.9 % 100 mL IVPB        2 g 200 mL/hr over 30 Minutes Intravenous  Once 06/12/23 2151 06/12/23 2253   06/12/23 2200  metroNIDAZOLE (FLAGYL) IVPB 500 mg        500 mg 100 mL/hr over 60 Minutes Intravenous  Once 06/12/23 2151 06/13/23 0011   06/12/23 2200  vancomycin (VANCOCIN) IVPB 1000 mg/200 mL premix        1,000 mg 200 mL/hr over 60 Minutes Intravenous  Once 06/12/23 2151 06/13/23 0011      Subjective: Patient was seen and examined at bedside.Overnight events noted.   Patient reports doing better,  She  is going to have hemodialysis today.  She denies any other concerns.  Objective: Vitals:   06/16/23 0500 06/16/23 0614 06/16/23 0731 06/16/23 0937  BP:  114/79  120/74  Pulse:  70  72   Resp:  18 11 18   Temp:  98.4 F (36.9 C)  98.3 F (36.8 C)  TempSrc:      SpO2:  99%  100%  Weight: 56.2 kg     Height:        Intake/Output Summary (Last 24 hours) at 06/16/2023 1347 Last data filed at 06/16/2023 1100 Gross per 24 hour  Intake 430 ml  Output 0 ml  Net 430 ml   Filed Weights   06/15/23 0451 06/15/23 2126 06/16/23 0500  Weight: 53.7 kg 56.1 kg 56.2 kg    Examination:  General exam: Appears calm , comfortable, sick looking, deconditioned, frail, not in any acute distress Respiratory system: CTA bilaterally. Respiratory effort normal. RR 16 Cardiovascular system: S1 & S2 heard, RRR. No JVD, murmurs, rubs, gallops or clicks.  Gastrointestinal system: Abdomen is non distended, soft and non tender. Normal bowel sounds heard. Central nervous system: Alert and oriented X 3. No focal neurological deficits. Extremities: No edema, no cyanosis, no clubbing Skin: No rashes, lesions or ulcers Psychiatry: Judgement and insight appear normal. Mood & affect appropriate.     Data Reviewed: I have personally reviewed following labs and imaging studies  CBC: Recent Labs  Lab 06/12/23 2101 06/12/23 2130 06/12/23 2201 06/13/23 0957 06/14/23 0817  WBC 2.3*  --   --  6.5 4.3  NEUTROABS 1.3*  --   --  5.5  --   HGB 9.7* 12.9  12.9 10.2* 8.5* 7.9*  HCT 34.6* 38.0  38.0 30.0* 27.2* 25.1*  MCV 96.9  --   --  87.5 86.9  PLT 85*  --   --  75* 74*   Basic Metabolic Panel: Recent Labs  Lab 06/13/23 0226 06/13/23 0630 06/13/23 0955 06/13/23 1913 06/14/23 0815 06/15/23 0853  NA  --  127* 125* 123* 122* 125*  K  --  3.2* 2.9* 2.7* 3.5 4.5  CL  --  89* 88* 90* 89* 92*  CO2  --  22 22 24 24 25   GLUCOSE  --  605* 369* 32* 123* 216*  BUN  --  14 15 16 16 12   CREATININE  --  5.00* 5.12* 5.27* 5.41* 4.09*  CALCIUM  --  7.5* 7.8* 7.4* 7.3* 7.3*  MG 1.6* 1.6*  --   --   --   --   PHOS 2.5 2.2*  --   --  1.6* 2.1*   GFR: Estimated Creatinine Clearance: 17.2 mL/min (A)  (by C-G formula based on SCr of 4.09 mg/dL (H)). Liver Function Tests: Recent Labs  Lab 06/12/23 2101 06/13/23 0630 06/14/23 0815 06/15/23 0853  AST 388* 139* 76*  --   ALT 86* 56* 33  --   ALKPHOS 297* 217* 163*  --   BILITOT 0.9 1.0 1.1  --   PROT 7.9 6.8 5.6*  --   ALBUMIN 3.2* 2.7* 2.1*  2.1* 2.3*   Recent Labs  Lab 06/12/23 2101 06/13/23 0226  LIPASE 25  --   AMYLASE  --  27*   No results for input(s): "AMMONIA" in the last 168 hours. Coagulation Profile: No results for input(s): "INR", "PROTIME" in the last 168 hours. Cardiac Enzymes: Recent Labs  Lab 06/13/23 0226  CKTOTAL 45   BNP (last 3 results) No results  for input(s): "PROBNP" in the last 8760 hours. HbA1C: No results for input(s): "HGBA1C" in the last 72 hours. CBG: Recent Labs  Lab 06/15/23 2339 06/16/23 0615 06/16/23 0701 06/16/23 0756 06/16/23 1153  GLUCAP 118* 69* 54* 123* 129*   Lipid Profile: No results for input(s): "CHOL", "HDL", "LDLCALC", "TRIG", "CHOLHDL", "LDLDIRECT" in the last 72 hours. Thyroid Function Tests: No results for input(s): "TSH", "T4TOTAL", "FREET4", "T3FREE", "THYROIDAB" in the last 72 hours.  Anemia Panel: No results for input(s): "VITAMINB12", "FOLATE", "FERRITIN", "TIBC", "IRON", "RETICCTPCT" in the last 72 hours.  Sepsis Labs: Recent Labs  Lab 06/13/23 0226 06/13/23 0630 06/13/23 0957 06/14/23 0832  LATICACIDVEN 2.9* 4.3* 4.0* 1.2    Recent Results (from the past 240 hours)  MRSA Next Gen by PCR, Nasal     Status: None   Collection Time: 06/13/23  1:22 AM   Specimen: Nasal Mucosa; Nasal Swab  Result Value Ref Range Status   MRSA by PCR Next Gen NOT DETECTED NOT DETECTED Final    Comment: (NOTE) The GeneXpert MRSA Assay (FDA approved for NASAL specimens only), is one component of a comprehensive MRSA colonization surveillance program. It is not intended to diagnose MRSA infection nor to guide or monitor treatment for MRSA infections. Test performance  is not FDA approved in patients less than 84 years old. Performed at [facility], [address] [place], [place] [address]   Culture, blood (Routine X 2) w Reflex to ID Panel     Status: None (Preliminary result)   Collection Time: 06/13/23  2:26 AM   Specimen: BLOOD  Result Value Ref Range Status   Specimen Description BLOOD BLOOD RIGHT ARM  Final   Special Requests   Final    BOTTLES DRAWN AEROBIC ONLY Blood Culture results may not be optimal due to an inadequate volume of blood received in culture bottles   Culture   Final    NO GROWTH 3 DAYS Performed at [facility], [address], [place], [place] [address]    Report Status PENDING  Incomplete  Culture, blood (Routine X 2) w Reflex to ID Panel     Status: None (Preliminary result)   Collection Time: 06/13/23  2:26 AM   Specimen: BLOOD  Result Value Ref Range Status   Specimen Description BLOOD BLOOD RIGHT HAND  Final   Special Requests   Final    BOTTLES DRAWN AEROBIC AND ANAEROBIC Blood Culture results may not be optimal due to an inadequate volume of blood received in culture bottles   Culture   Final    NO GROWTH 3 DAYS Performed at [facility], [address], [place], [place] [address]    Report Status PENDING  Incomplete    Radiology Studies: MR BRAIN WO CONTRAST Result Date: 06/15/2023 CLINICAL DATA:  Provided history: Seizure, new onset, no history of trauma. EXAM: MRI HEAD WITHOUT CONTRAST TECHNIQUE: Multiplanar, multiecho pulse sequences of the brain and surrounding structures were obtained without intravenous contrast. COMPARISON:  Head CT 06/13/2023.  Brain MRI 04/19/2021. FINDINGS: The patient was unable to tolerate the full examination. As a result, a routine coronal FLAIR sequence through the hippocampi could not be acquired. Additionally, the acquired sequences are intermittently motion degraded. Most notably, the sagittal T1-weighted sequence is moderate-to-severely motion degraded, the  axial FLAIR sequence is moderately motion degraded, the axial T1 sequence is moderately motion degraded, the coronal T2 sequence through the hippocampi is moderately motion degraded and the coronal whole brain T2  sequence is severely motion degraded. Within these limitations, findings are as follows. Brain: Generalized cerebral volume loss, mild but unexpected for age. A few small foci of T2 FLAIR hyperintense signal abnormality scattered within the cerebral white matter are nonspecific, but likely reflect chronic small vessel ischemic disease given the patient's history of poorly controlled diabetes. No cortical encephalomalacia is identified. Within described limitations, no appreciable hippocampal size or signal asymmetry. There is no acute infarct. No evidence of an intracranial mass. No chronic intracranial blood products. No extra-axial fluid collection. No midline shift. Vascular: Maintained flow voids within the proximal large arterial vessels. Skull and upper cervical spine: Abnormal T1 hypointense marrow signal within the calvarium and visualized cervical spine, nonspecific but likely related to the patient's history of end-stage renal disease. Sinuses/Orbits: No mass or acute finding within the imaged orbits. No significant paranasal sinus disease. IMPRESSION: 1. Prematurely terminated and significantly motion degraded examination as described. Within these limitation, findings are as follows. 2. No evidence of an acute intracranial abnormality. 3. A few small foci of T2 FLAIR hyperintense signal abnormality scattered within the cerebral white matter are nonspecific, but likely reflect chronic small vessel ischemic disease given the patient's history of poorly controlled diabetes. 4. Otherwise unremarkable MRI appearance of the brain. Electronically Signed   By: Jackey Loge D.O.   On: 06/15/2023 14:18    Scheduled Meds:  amLODipine  5 mg Oral Daily   Chlorhexidine Gluconate Cloth  6 each Topical  Daily   darbepoetin (ARANESP) injection - DIALYSIS  60 mcg Subcutaneous Q Tue-1800   heparin injection (subcutaneous)  5,000 Units Subcutaneous Q12H   insulin aspart  1-3 Units Subcutaneous Q4H   labetalol  100 mg Oral BID   levothyroxine  25 mcg Oral Q0600   pantoprazole  40 mg Oral Daily   rosuvastatin  10 mg Oral Daily   Continuous Infusions:  anticoagulant sodium citrate       LOS: 4 days    Time spent: 35 mins    Willeen Niece, MD [facility]   If 7PM-7AM, please contact night-coverage

## 2023-06-16 NOTE — Progress Notes (Signed)
Hypoglycemic Event  CBG:   Latest Reference Range & Units 06/16/23 06:15  Glucose-Capillary 70 - 99 mg/dL 69 (L)  (L): Data is abnormally low  Treatment: 4 oz juice/soda  Symptoms: None  Follow-up CBG: Time:  CBG Result:   Latest Reference Range & Units 06/16/23 07:01  Glucose-Capillary 70 - 99 mg/dL 54 (L)  (L): Data is abnormally low Possible Reasons for Event: Inadequate meal intake  Comments/MD notified:    Carmin Muskrat

## 2023-06-16 NOTE — Evaluation (Signed)
Physical Therapy Evaluation Patient Details Name: Ann Robertson MRN: [id number] DOB: [birth year] Today's Date: 06/16/2023  History of Present Illness  Pt is 33 yo female presenting to [facility] on 06/14[facility]22 for seizures. Recently admitted 06/03/22 after being found obtunded by family and missing several HD sessions as well as not taking meds. Labs revealed DKA. PMH: DM1, ESRD on HD, HSV-II, HLD.   Clinical Impression  Pt in bed upon arrival of PT, agreeable to evaluation at this time. Prior to admission the pt was limited primarily to her bed, but reports transferring with assist of family from her bed-BSC or bed-WC as needed, despite spending most of her time in bed. The pt presents with significant frailty and limited activity tolerance in addition to poor strength. She required modA to reposition in bed and is dependent on increased time and use of bed features to complete movements. OOB mobility limited by arrival of transport to take the pt to HD, will continue to assess on future sessions. Anticipate the pt could return home at [facility] level (her baseline), but benefit from skilled PT acutely and after d/c to maximize functional strength and endurance for household mobility.       If plan is discharge home, recommend the following: A little help with walking and/or transfers;A little help with bathing/dressing/bathroom;Assistance with cooking/housework   Can travel by private vehicle        Equipment Recommendations None recommended by PT  Recommendations for Other Services       Functional Status Assessment Patient has had a recent decline in their functional status and demonstrates the ability to make significant improvements in function in a reasonable and predictable amount of time.     Precautions / Restrictions Precautions Precautions: Fall Restrictions Weight Bearing Restrictions Per Provider Order: No      Mobility  Bed Mobility Overal bed mobility: Needs Assistance Bed  Mobility: Supine to Sit     Supine to sit: Mod assist, HOB elevated, Used rails     General bed mobility comments: modA to pull to sit in bed, pt dependent on therapist to maintain    Transfers                   General transfer comment: pt declined and then arrival of transport for HD, will continue to attempt in future sessions        Balance Overall balance assessment: Needs assistance Sitting-balance support: No upper extremity supported, Feet supported Sitting balance-Leahy Scale: Poor Sitting balance - Comments: dependent on PT support for long-sitting Postural control: Posterior lean                                   Pertinent Vitals/Pain Pain Assessment Pain Assessment: Faces Faces Pain Scale: Hurts a little bit Pain Location: bottom, discomfort Pain Descriptors / Indicators: Discomfort Pain Intervention(s): Limited activity within patient's tolerance, Monitored during session, Repositioned    Home Living Family/patient expects to be discharged to:: Private residence Living Arrangements: Children;Parent Available Help at Discharge: Family Type of Home: House Home Access: Ramped entrance       Home Layout: One level Home Equipment: Agricultural consultant (2 wheels);Cane - single point      Prior Function Prior Level of Function : Needs assist             Mobility Comments: w/c level, pivots into it with assist of son. Father propels the pt ADLs Comments:  assist with getting in/out shower. ind other ADLs     Extremity/Trunk Assessment   Upper Extremity Assessment Upper Extremity Assessment: Defer to OT evaluation    Lower Extremity Assessment Lower Extremity Assessment: Generalized weakness (frail)    Cervical / Trunk Assessment Cervical / Trunk Assessment: Other exceptions Cervical / Trunk Exceptions: frail  Communication   Communication Communication: No apparent difficulties Cueing Techniques: Verbal cues  Cognition  Arousal: Alert Behavior During Therapy: Flat affect, WFL for tasks assessed/performed Overall Cognitive Status: No family/caregiver present to determine baseline cognitive functioning                                 General Comments: pt very descriptive with how she likes things done, not verbose when asked questions. benefits from max encouragement               Assessment/Plan    PT Assessment Patient needs continued PT services  PT Problem List Decreased strength;Decreased activity tolerance;Decreased balance;Decreased mobility;Decreased coordination       PT Treatment Interventions DME instruction;Gait training;Functional mobility training;Therapeutic activities;Therapeutic exercise;Balance training    PT Goals (Current goals can be found in the Care Plan section)  Acute Rehab PT Goals Patient Stated Goal: return home PT Goal Formulation: With patient Time For Goal Achievement: 06/30/23 Potential to Achieve Goals: Fair    Frequency Min 1X/week        AM-PAC PT "6 Clicks" Mobility  Outcome Measure Help needed turning from your back to your side while in a flat bed without using bedrails?: A Little Help needed moving from lying on your back to sitting on the side of a flat bed without using bedrails?: A Lot Help needed moving to and from a bed to a chair (including a wheelchair)?: A Lot Help needed standing up from a chair using your arms (e.g., wheelchair or bedside chair)?: A Lot Help needed to walk in hospital room?: Total Help needed climbing 3-5 steps with a railing? : Total 6 Click Score: 11    End of Session   Activity Tolerance: Patient limited by fatigue;Other (comment) (arrival of transport for HD) Patient left: in bed (with transport) Nurse Communication: Mobility status PT Visit Diagnosis: Unsteadiness on feet (R26.81);Muscle weakness (generalized) (M62.81)    Time: 1610-9604 PT Time Calculation (min) (ACUTE ONLY): 11 min   Charges:    PT Evaluation $PT Eval Moderate Complexity: 1 Mod   PT General Charges $$ ACUTE PT VISIT: 1 Visit         Vickki Muff, PT, DPT   Acute Rehabilitation Department Office [phone] Secure Chat Communication Preferred   Ronnie Derby 06/16/2023, 4:02 PM

## 2023-06-16 NOTE — Progress Notes (Signed)
Pt educated that her face can't be covered during dialysis tx for safety.

## 2023-06-16 NOTE — Progress Notes (Signed)
[facility] Progress Note   Subjective:   No new events. No acute findings on brain MRI Seen in room. Sleeping but easy to arouse.  For dialysis today   Objective Vitals:   06/16/23 0500 06/16/23 0614 06/16/23 0731 06/16/23 0937  BP:  114/79  120/74  Pulse:  70  72  Resp:  18 11 18   Temp:  98.4 F (36.9 C)  98.3 F (36.8 C)  TempSrc:      SpO2:  99%  100%  Weight: 56.2 kg     Height:         Additional Objective Labs: Basic Metabolic Panel: Recent Labs  Lab 06/13/23 0630 06/13/23 0955 06/13/23 1913 06/14/23 0815 06/15/23 0853  NA 127*   < > 123* 122* 125*  K 3.2*   < > 2.7* 3.5 4.5  CL 89*   < > 90* 89* 92*  CO2 22   < > 24 24 25   GLUCOSE 605*   < > 32* 123* 216*  BUN 14   < > 16 16 12   CREATININE 5.00*   < > 5.27* 5.41* 4.09*  CALCIUM 7.5*   < > 7.4* 7.3* 7.3*  PHOS 2.2*  --   --  1.6* 2.1*   < > = values in this interval not displayed.   CBC: Recent Labs  Lab 06/12/23 2101 06/12/23 2130 06/12/23 2201 06/13/23 0957 06/14/23 0817  WBC 2.3*  --   --  6.5 4.3  NEUTROABS 1.3*  --   --  5.5  --   HGB 9.7*   < > 10.2* 8.5* 7.9*  HCT 34.6*   < > 30.0* 27.2* 25.1*  MCV 96.9  --   --  87.5 86.9  PLT 85*  --   --  75* 74*   < > = values in this interval not displayed.   Blood Culture    Component Value Date/Time   SDES BLOOD BLOOD RIGHT ARM 06/13/2023 0226   SDES BLOOD BLOOD RIGHT HAND 06/13/2023 0226   SPECREQUEST  06/13/2023 0226    BOTTLES DRAWN AEROBIC ONLY Blood Culture results may not be optimal due to an inadequate volume of blood received in culture bottles   SPECREQUEST  06/13/2023 0226    BOTTLES DRAWN AEROBIC AND ANAEROBIC Blood Culture results may not be optimal due to an inadequate volume of blood received in culture bottles   CULT  06/13/2023 0226    NO GROWTH 3 DAYS Performed at [facility], [address] [place], [place] [address]    CULT  06/13/2023 0226    NO GROWTH 3 DAYS Performed at [facility]  Lab, [address] [place], [place] [address]    REPTSTATUS PENDING 06/13/2023 0226   REPTSTATUS PENDING 06/13/2023 0226     Physical Exam General: Alert, lying in bed Heart: RRR Lungs: Clear  Abdomen: soft non-tender  Extremities: No sig LE edema  Dialysis Access: LUE AVG +bruit   Medications:    amLODipine  5 mg Oral Daily   Chlorhexidine Gluconate Cloth  6 each Topical Daily   darbepoetin (ARANESP) injection - DIALYSIS  60 mcg Subcutaneous Q Tue-1800   heparin injection (subcutaneous)  5,000 Units Subcutaneous Q12H   insulin aspart  1-3 Units Subcutaneous Q4H   labetalol  100 mg Oral BID   levothyroxine  25 mcg Oral Q0600   pantoprazole  40 mg Oral Daily   rosuvastatin  10 mg Oral Daily    [facility] HD:  [facility] M[facility] 4h, BFR 400 DFR a1.5, 2K 3Ca bath usign LUE AVG. F180. Mircera last dose 12/24 , C3 0.65mcg three times per week. Last HD 06/10/23 left 4kg above EDW.   Assessment/Plan: 1. DMT1/Hyperglycemia - management per primary team.  2. ESRD - HD MWF. Off schedule this week d/t high inpatient census. Had HD Tues. Plan for next HD Thurs.  3. HTN/volume- Chronic volume overload with HTN. UF with HD as able.  4. Anemia- Hgb 7.9 Aranesp 60  mcg given 1/14 5. 2HPTH-  CorrCa acceptable. Phos on low side. Pharmacy supplementing. 6. Pseudohyponatremia - in the setting of hyperglycemia. Hypervolemia also a factor  7. Seizure activity - neuro following. EEG w diffuse encephalopathy. For brain MRI.  7. Nutrition - Renal diet/fluid restriction. Add prot supp for low albumin   Tomasa Blase PA-C  [facility] 06/16/2023,10:33 AM

## 2023-06-17 DIAGNOSIS — R569 Unspecified convulsions: Secondary | ICD-10-CM | POA: Diagnosis not present

## 2023-06-17 LAB — GLUCOSE, CAPILLARY
Glucose-Capillary: 112 mg/dL — ABNORMAL HIGH (ref 70–99)
Glucose-Capillary: 251 mg/dL — ABNORMAL HIGH (ref 70–99)
Glucose-Capillary: 282 mg/dL — ABNORMAL HIGH (ref 70–99)
Glucose-Capillary: 78 mg/dL (ref 70–99)
Glucose-Capillary: >600 mg/dL (ref 70–99)

## 2023-06-17 MED ORDER — LABETALOL HCL 100 MG PO TABS: 100.0000 mg | ORAL_TABLET | Freq: Two times a day (BID) | ORAL | 0 refills | Status: AC

## 2023-06-17 MED ORDER — AMLODIPINE BESYLATE 5 MG PO TABS: 5.0000 mg | ORAL_TABLET | Freq: Every day | ORAL | 0 refills | Status: AC

## 2023-06-17 MED ORDER — LEVOTHYROXINE SODIUM 25 MCG PO TABS: 25.0000 ug | ORAL_TABLET | Freq: Every day | ORAL | 0 refills | Status: AC

## 2023-06-17 NOTE — Progress Notes (Signed)
Pt non-compliant with medications.  States that she cannot take her medications because it is "painful to swallow" and requests a hot pack. Staff explained that we do not have anymore hot packs. She requests pain medicine which is also in pill form and she had been compliant in taking. I explained that it is important that she take her BP meds due to her diastolic being over 100. She says that she will take them but "not right now".  I explained that I will also give her pain medicine when she decides that she will take the rest of her meds and when she feels that she can swallow her pills.

## 2023-06-17 NOTE — Progress Notes (Signed)
Physical Therapy Treatment Patient Details Name: Ann Robertson MRN: [id number] DOB: 06-Jan-1991 Today's Date: 06/17/2023   History of Present Illness Pt is 33 yo female presenting to [facility] on 06/14/22 for seizures. Recently admitted 06/03/22 after being found obtunded by family and missing several HD sessions as well as not taking meds. Labs revealed DKA. PMH: DM1, ESRD on HD, HSV-II, HLD.    PT Comments  The pt was agreeable to session with focus on return home later today. She was able to describe assistance available for transfers, confirms she has a WC and BSC but has given away a RW she used to have. The pt was able to complete bed mobility with supervision and use of bed features, maintain sitting balance without assistance, and complete sit-stand and stand-pivot transfers with CGA. She reiterated her goal to return to walking, declined at this time due to fatigue. She is able to return home at largely [facility] level at this time with HHPT to progress functional strength and OOB mobility.   If plan is discharge home, recommend the following: A little help with walking and/or transfers;A little help with bathing/dressing/bathroom;Assistance with cooking/housework   Can travel by Training and development officer (2 wheels)    Recommendations for Other Services       Precautions / Restrictions Precautions Precautions: Fall Restrictions Weight Bearing Restrictions Per Provider Order: No     Mobility  Bed Mobility Overal bed mobility: Needs Assistance Bed Mobility: Supine to Sit     Supine to sit: HOB elevated, Used rails, Supervision     General bed mobility comments: supervision with use of bed features, pt reports family helps her at home    Transfers Overall transfer level: Needs assistance Equipment used: 1 person hand held assist Transfers: Sit to/from Stand, Bed to chair/wheelchair/BSC Sit to Stand: Min assist, Contact guard assist    Step pivot transfers: Min assist       General transfer comment: pt needing minA to rise and HHA to manage steps to [facility] to mimic home transfer. She is dependent on UE support, declined use of RW this session    Ambulation/Gait Ambulation/Gait assistance: Contact guard assist Gait Distance (Feet): 4 Feet Assistive device:  (bed rail) Gait Pattern/deviations: Step-to pattern, Decreased stride length Gait velocity: decreased Gait velocity interpretation: <1.31 ft/sec, indicative of household ambulator   General Gait Details: small steps, dependent on UE support either through HHA or bed rails, declined use of RW this session     Balance Overall balance assessment: Needs assistance Sitting-balance support: No upper extremity supported, Feet supported Sitting balance-Leahy Scale: Poor Sitting balance - Comments: dependent on PT support for long-sitting   Standing balance support: Single extremity supported Standing balance-Leahy Scale: Poor Standing balance comment: dependent on UE support                            Cognition Arousal: Alert Behavior During Therapy: Flat affect, WFL for tasks assessed/performed Overall Cognitive Status: No family/caregiver present to determine baseline cognitive functioning                                 General Comments: pt pleasant and agreeable through session, able to describe transfers well        Exercises      General Comments General comments (skin integrity, edema, etc.):  vss      Pertinent Vitals/Pain Pain Assessment Pain Assessment: Faces Faces Pain Scale: Hurts a little bit Pain Location: bottom, discomfort Pain Descriptors / Indicators: Discomfort Pain Intervention(s): Limited activity within patient's tolerance, Monitored during session     PT Goals (current goals can now be found in the care plan section) Acute Rehab PT Goals Patient Stated Goal: return home PT Goal Formulation: With  patient Time For Goal Achievement: 06/30/23 Potential to Achieve Goals: Fair Progress towards PT goals: Progressing toward goals    Frequency    Min 1X/week       AM-PAC PT "6 Clicks" Mobility   Outcome Measure  Help needed turning from your back to your side while in a flat bed without using bedrails?: A Little Help needed moving from lying on your back to sitting on the side of a flat bed without using bedrails?: A Lot Help needed moving to and from a bed to a chair (including a wheelchair)?: A Lot Help needed standing up from a chair using your arms (e.g., wheelchair or bedside chair)?: A Lot Help needed to walk in hospital room?: Total Help needed climbing 3-5 steps with a railing? : Total 6 Click Score: 11    End of Session Equipment Utilized During Treatment: Gait belt Activity Tolerance: Patient limited by fatigue Patient left: in bed (sitting EOB) Nurse Communication: Mobility status PT Visit Diagnosis: Unsteadiness on feet (R26.81);Muscle weakness (generalized) (M62.81)     Time: 1029-1050 PT Time Calculation (min) (ACUTE ONLY): 21 min  Charges:    $Therapeutic Exercise: 8-22 mins PT General Charges $$ ACUTE PT VISIT: 1 Visit                     Vickki Muff, PT, DPT   Acute Rehabilitation Department Office [phone] Secure Chat Communication Preferred   Ronnie Derby 06/17/2023, 12:08 PM

## 2023-06-17 NOTE — Progress Notes (Signed)
Contacted by nephrologist with request for pt to receive an out-pt HD appt tomorrow. Contacted [facility] and spoke to Tax adviser. Pt can arrive at 10:00 am for 10:30 am chair time tomorrow. Pt has already been d/c so called pt via cell number on demographics. Went to [employer]. Navigator left a message for pt that clinic can treat pt tomorrow and to please arrive at 10:00 for 10:30 chair time. Pt advised in message to please call clinic directly to let them know if she will attend treatment. Spoke to clinic manger again to be advised of the above. Clinic staff to attempt to reach pt via phone as well. Update provided to nephrologist and renal PA.   Olivia Canter Renal Navigator [phone]

## 2023-06-17 NOTE — Progress Notes (Signed)
Occupational Therapy Treatment Patient Details Name: Aralyn Yott MRN: [id number] DOB: 08-May-1991 Today's Date: 06/17/2023   History of present illness Pt is 33 yo female presenting to [facility] on 06/14/22 for seizures. Recently admitted 06/03/22 after being found obtunded by family and missing several HD sessions as well as not taking meds. Labs revealed DKA. PMH: DM1, ESRD on HD, HSV-II, HLD.   OT comments  Session focus on rollator adjustment per request of RN. Patient with ride waiting downstairs, therefore completing dressing ADL with patient. Patient with need for CGA for brief, but required mod A for pants. Patient also able to ambulate with CGA with rollator, and adjustment appropriate. OT recommendation remains correct.       If plan is discharge home, recommend the following:  A little help with walking and/or transfers;A little help with bathing/dressing/bathroom;Assistance with cooking/housework;Assist for transportation   Equipment Recommendations  None recommended by OT    Recommendations for Other Services      Precautions / Restrictions Precautions Precautions: Fall Restrictions Weight Bearing Restrictions Per Provider Order: No       Mobility Bed Mobility Overal bed mobility: Needs Assistance             General bed mobility comments: sitting EOB upon arrival    Transfers Overall transfer level: Needs assistance Equipment used: Rollator (4 wheels) Transfers: Sit to/from Stand, Bed to chair/wheelchair/BSC Sit to Stand: Contact guard assist     Step pivot transfers: Contact guard assist     General transfer comment: CGA for transfers with rollators, increased cues to use brakes on rollator     Balance Overall balance assessment: Needs assistance Sitting-balance support: No upper extremity supported, Feet supported Sitting balance-Leahy Scale: Fair Sitting balance - Comments: could not accept challenge   Standing balance support: Single  extremity supported Standing balance-Leahy Scale: Poor Standing balance comment: dependent on UE support                           ADL either performed or assessed with clinical judgement   ADL       Grooming: Sitting;Set up           Upper Body Dressing : Sitting;Set up   Lower Body Dressing: Moderate assistance;Sitting/lateral leans   Toilet Transfer: Contact guard assist;BSC/3in1;Rollator (4 wheels)             General ADL Comments: Session focus on rollator adjustment per request of RN. Patient with ride waiting downstairs, therefore completing dressing ADL with patient. Patient with need for CGA for brief, but required mod A for pants. Patient also able to ambulate with CGA with rollator, and adjustment appropriate. OT recommendation remains correct.    Extremity/Trunk Assessment              Vision       Perception     Praxis      Cognition Arousal: Alert Behavior During Therapy: Flat affect, WFL for tasks assessed/performed Overall Cognitive Status: No family/caregiver present to determine baseline cognitive functioning                                 General Comments: pt pleasant and agreeable through session, able to describe transfers well        Exercises      Shoulder Instructions       General Comments VSS    Pertinent Vitals/ Pain  Pain Assessment Pain Assessment: Faces Faces Pain Scale: Hurts a little bit Pain Location: bottom, discomfort Pain Descriptors / Indicators: Discomfort Pain Intervention(s): Limited activity within patient's tolerance, Monitored during session, Repositioned  Home Living                                          Prior Functioning/Environment              Frequency  Min 1X/week        Progress Toward Goals  OT Goals(current goals can now be found in the care plan section)     Acute Rehab OT Goals Patient Stated Goal: to get better OT Goal  Formulation: With patient Time For Goal Achievement: 07/01/23 Potential to Achieve Goals: Good  Plan      Co-evaluation                 AM-PAC OT "6 Clicks" Daily Activity     Outcome Measure   Help from another person eating meals?: None Help from another person taking care of personal grooming?: A Little Help from another person toileting, which includes using toliet, bedpan, or urinal?: A Lot Help from another person bathing (including washing, rinsing, drying)?: A Little Help from another person to put on and taking off regular upper body clothing?: A Little Help from another person to put on and taking off regular lower body clothing?: A Little 6 Click Score: 18    End of Session Equipment Utilized During Treatment: Rollator (4 wheels)  OT Visit Diagnosis: Muscle weakness (generalized) (M62.81)   Activity Tolerance Patient tolerated treatment well   Patient Left in bed;with call bell/phone within reach;with nursing/sitter in room (sitting EOB)   Nurse Communication Mobility status        Time: 5188-4166 OT Time Calculation (min): 16 min  Charges: OT General Charges $OT Visit: 1 Visit OT Treatments $Self Care/Home Management : 8-22 mins  Pollyann Glen E. Barbi Kumagai, OTR/L Acute Rehabilitation Services [phone]   Cherlyn Cushing 06/17/2023, 12:32 PM

## 2023-06-17 NOTE — Discharge Instructions (Signed)
Advised to follow-up with primary care physician in 1 week. Advised to follow-up with nephrology for continuation of hemodialysis. Medication compliance emphasized in detail.

## 2023-06-17 NOTE — Progress Notes (Signed)
DISCHARGE NOTE HOME Ann Robertson to be discharged Home per MD order. Discussed prescriptions and follow up appointments with the patient. Prescriptions given to patient; medication list explained in detail. Patient verbalized understanding.  Skin clean, dry and intact without evidence of skin break down, no evidence of skin tears noted. IV catheter discontinued intact. Site without signs and symptoms of complications. Dressing and pressure applied. Pt denies pain at the site currently. No complaints noted.  Patient free of lines, drains, and wounds.   An After Visit Summary (AVS) was printed and given to the patient. Patient escorted via wheelchair, and discharged home via private auto.  Margarita Grizzle, RN

## 2023-06-17 NOTE — TOC Transition Note (Addendum)
Transition of Care [facility]) - Discharge Note   Patient Details  Name: Ann Robertson MRN: [id number] Date of Birth: [birth year]  [facility] CM/SW Contact:  Lockie Pares, RN Phone Number: 06/17/2023, 10:27 AM   Clinical Narrative:     Spoke to patient at bedside regarding discharge planning. Patient expresses difficulty getting around discussed home health and spoke to sister on the phone about PCS services. The patient goes to dialysis, they will work on her schedule. But PCS will have to get authorized through [facility].  Patient states her father will pick her up Dan Humphreys ordered via [employer] they will bring to room Final next level of care: Home w Home Health Services Barriers to Discharge: No Barriers Identified   Patient Goals and CMS Choice Patient states their goals for this hospitalization and ongoing recovery are:: Go home have PCS services   Choice offered to / list presented to : Patient      Discharge Placement  Home with home health and DME                     Discharge Plan and Services Additional resources added to the After Visit Summary for   In-house Referral: Clinical Social Work Discharge Planning Services: CM Consult Post Acute Care Choice: Durable Medical Equipment, Home Health          DME Arranged: Walker rolling with seat DME Agency: [employer] Date DME Agency Contacted: 06/17/23 Time DME Agency Contacted: 1025 Representative spoke with at DME Agency: [name] HH Arranged: PT, OT, Social Work [employer] Agency: [facility] Date [facility] Agency Contacted: 06/17/23 Time HH Agency Contacted: 1026 Representative spoke with at [facility] Agency: Kandee Keen  Social Drivers of Health (SDOH) Interventions SDOH Screenings   Food Insecurity: No Food Insecurity (06/13/2023)  Housing: Low Risk  (06/13/2023)  Transportation Needs: No Transportation Needs (06/13/2023)  Utilities: Not At Risk (06/13/2023)  Social Connections: Unknown  (09/28/2021)   Received from [facility]  Tobacco Use: High Risk (06/12/2023)     Readmission Risk Interventions     No data to display

## 2023-06-17 NOTE — Discharge Planning (Signed)
[facility] Patient Discharge Orders - [facility] CLINIC: [facility]  Patient's name: Ann Robertson Admit/DC Dates: 06/12/2023 - 06/17/2023  DISCHARGE DIAGNOSES: Hyperglycemia/uncontrolled T1D  Seizure - related to #1  HD ORDER CHANGES: Heparin change: no EDW Change: no  Bath Change: no  ANEMIA MANAGEMENT: Aranesp: Given: yes   Amount/Date of last dose: given 1/14 ESA dose for discharge: mircera 100 mcg IV q 2 weeks, to start on 1/21 IV Iron dose at discharge: per protocol Transfusion: Given: no  BONE/MINERAL MEDICATIONS: Hectorol/Calcitriol change: no Sensipar/Parsabiv change: no  ACCESS INTERVENTION/CHANGE: no Details:  RECENT LABS: Recent Labs  Lab 06/14/23 0817 06/15/23 0853  HGB 7.9*  --   NA  --  125*  K  --  4.5  CALCIUM  --  7.3*  PHOS  --  2.1*  ALBUMIN  --  2.3*   IV ANTIBIOTICS: no Details:  OTHER ANTICOAGULATION: On Coumadin?: no  OTHER/APPTS/LAB ORDERS: - Continue to assist getting better DM control - Was scheduled for HD today -> left before HD. Message left on pt's VM about HD 1/17 at 10am.  D/C Meds to be reconciled by nurse after every discharge.  Completed By: Ozzie Hoyle, PA-C [facility] Pager [phone]   Reviewed by: MD:______ RN_______

## 2023-06-17 NOTE — Progress Notes (Addendum)
[facility] Progress Note   Subjective:   Seen in room - last HD yesterday 1/16. Patient lying comfortably in bed. Expressed frustrations about new medications being adding without her knowledge. Denies chest pain/dyspnea/V. Admits to some nausea. Patient is eager to go home.   She is due for dialysis today to stay on schedule but declines dialysis prior to discharge.  I also offered to arrange outpatient dialysis for tomorrow off schedule.  She seemed to be in agreement and kidney navigator assisting.  Looks like they were successful finding a chair for tomorrow and efforts to communicate this to the patient by the hospital and at dialysis clinic made.  Objective Vitals:   06/16/23 1819 06/16/23 2108 06/17/23 0436 06/17/23 0918  BP:  (!) 154/88 (!) 154/107 138/85  Pulse:  88 83 79  Resp:  20 20 18   Temp:  98.1 F (36.7 C) (!) 97.5 F (36.4 C) 98.3 F (36.8 C)  TempSrc:  Oral Oral   SpO2:  100% 100% 100%  Weight: 53.5 kg     Height:       Physical Exam General: NAD, alert and lying in bed  Heart:RRR, +3 murmur Lungs: CTAB Abdomen: soft, non-distended, non-tender Extremities: no significant lower leg edema Dialysis Access: LUE AVG +thrill/+bruit  Additional Objective Labs: Basic Metabolic Panel: Recent Labs  Lab 06/13/23 0630 06/13/23 0955 06/13/23 1913 06/14/23 0815 06/15/23 0853  NA 127*   < > 123* 122* 125*  K 3.2*   < > 2.7* 3.5 4.5  CL 89*   < > 90* 89* 92*  CO2 22   < > 24 24 25   GLUCOSE 605*   < > 32* 123* 216*  BUN 14   < > 16 16 12   CREATININE 5.00*   < > 5.27* 5.41* 4.09*  CALCIUM 7.5*   < > 7.4* 7.3* 7.3*  PHOS 2.2*  --   --  1.6* 2.1*   < > = values in this interval not displayed.   Liver Function Tests: Recent Labs  Lab 06/12/23 2101 06/13/23 0630 06/14/23 0815 06/15/23 0853  AST 388* 139* 76*  --   ALT 86* 56* 33  --   ALKPHOS 297* 217* 163*  --   BILITOT 0.9 1.0 1.1  --   PROT 7.9 6.8 5.6*  --   ALBUMIN 3.2* 2.7* 2.1*   2.1* 2.3*   Recent Labs  Lab 06/12/23 2101 06/13/23 0226  LIPASE 25  --   AMYLASE  --  27*   CBC: Recent Labs  Lab 06/12/23 2101 06/12/23 2130 06/12/23 2201 06/13/23 0957 06/14/23 0817  WBC 2.3*  --   --  6.5 4.3  NEUTROABS 1.3*  --   --  5.5  --   HGB 9.7*   < > 10.2* 8.5* 7.9*  HCT 34.6*   < > 30.0* 27.2* 25.1*  MCV 96.9  --   --  87.5 86.9  PLT 85*  --   --  75* 74*   < > = values in this interval not displayed.   Blood Culture    Component Value Date/Time   SDES BLOOD BLOOD RIGHT ARM 06/13/2023 0226   SDES BLOOD BLOOD RIGHT HAND 06/13/2023 0226   SPECREQUEST  06/13/2023 0226    BOTTLES DRAWN AEROBIC ONLY Blood Culture results may not be optimal due to an inadequate volume of blood received in culture bottles   SPECREQUEST  06/13/2023 0226    BOTTLES DRAWN AEROBIC AND ANAEROBIC Blood Culture  results may not be optimal due to an inadequate volume of blood received in culture bottles   CULT  06/13/2023 0226    NO GROWTH 4 DAYS Performed at [facility], [address], [place], [place] [address]    CULT  06/13/2023 0226    NO GROWTH 4 DAYS Performed at [facility], [address] [place], [place] [address]    REPTSTATUS PENDING 06/13/2023 0226   REPTSTATUS PENDING 06/13/2023 0226    Cardiac Enzymes: Recent Labs  Lab 06/13/23 0226  CKTOTAL 45   CBG: Recent Labs  Lab 06/16/23 1153 06/16/23 2107 06/17/23 0002 06/17/23 0419 06/17/23 0741  GLUCAP 129* 265* 282* 78 112*   Studies/Results: MR BRAIN WO CONTRAST Result Date: 06/15/2023 CLINICAL DATA:  Provided history: Seizure, new onset, no history of trauma. EXAM: MRI HEAD WITHOUT CONTRAST TECHNIQUE: Multiplanar, multiecho pulse sequences of the brain and surrounding structures were obtained without intravenous contrast. COMPARISON:  Head CT 06/13/2023.  Brain MRI 04/19/2021. FINDINGS: The patient was unable to tolerate the full examination. As a result, a routine coronal FLAIR sequence through  the hippocampi could not be acquired. Additionally, the acquired sequences are intermittently motion degraded. Most notably, the sagittal T1-weighted sequence is moderate-to-severely motion degraded, the axial FLAIR sequence is moderately motion degraded, the axial T1 sequence is moderately motion degraded, the coronal T2 sequence through the hippocampi is moderately motion degraded and the coronal whole brain T2 sequence is severely motion degraded. Within these limitations, findings are as follows. Brain: Generalized cerebral volume loss, mild but unexpected for age. A few small foci of T2 FLAIR hyperintense signal abnormality scattered within the cerebral white matter are nonspecific, but likely reflect chronic small vessel ischemic disease given the patient's history of poorly controlled diabetes. No cortical encephalomalacia is identified. Within described limitations, no appreciable hippocampal size or signal asymmetry. There is no acute infarct. No evidence of an intracranial mass. No chronic intracranial blood products. No extra-axial fluid collection. No midline shift. Vascular: Maintained flow voids within the proximal large arterial vessels. Skull and upper cervical spine: Abnormal T1 hypointense marrow signal within the calvarium and visualized cervical spine, nonspecific but likely related to the patient's history of end-stage renal disease. Sinuses/Orbits: No mass or acute finding within the imaged orbits. No significant paranasal sinus disease. IMPRESSION: 1. Prematurely terminated and significantly motion degraded examination as described. Within these limitation, findings are as follows. 2. No evidence of an acute intracranial abnormality. 3. A few small foci of T2 FLAIR hyperintense signal abnormality scattered within the cerebral white matter are nonspecific, but likely reflect chronic small vessel ischemic disease given the patient's history of poorly controlled diabetes. 4. Otherwise  unremarkable MRI appearance of the brain. Electronically Signed   By: Jackey Loge D.O.   On: 06/15/2023 14:18   Medications:   amLODipine  5 mg Oral Daily   Chlorhexidine Gluconate Cloth  6 each Topical Daily   darbepoetin (ARANESP) injection - DIALYSIS  60 mcg Subcutaneous Q Tue-1800   heparin injection (subcutaneous)  5,000 Units Subcutaneous Q12H   insulin aspart  1-3 Units Subcutaneous Q4H   labetalol  100 mg Oral BID   levothyroxine  25 mcg Oral Q0600   pantoprazole  40 mg Oral Daily   rosuvastatin  10 mg Oral Daily    Dialysis Orders [facility] M[facility] 4h, BFR 400 DFR a1.5, 2K 3Ca bath usign LUE AVG. F180. Mircera last dose 12/24 , C3 0.2mcg three times per week. EDW 46.5. Last HD  06/10/23 left 4kg above EDW.   Assessment/Plan: DMT1/Hyperglycemia: management per primary team. Refusing nearly all of her meds Seizure activity: neuro signed off, suspect seizures due to hyperglycemia. EEG and brain MRI relatively unremarkable. ESRD: HD MWF. Off schedule this week d/t high inpatient census. Last HD yesterday.  Patient declined dialysis prior to discharge.  Dialysis arranged for tomorrow as an outpatient.  Wound VAC on usual outpatient schedule.   HTN/volume: Chronic volume overload with HTN. UF with HD as able.  Anemia of ESRD: Hbg 7.9, Aranesp 60 mcg given 1/14.  Secondary HPTH: CorrCa acceptable. Phos low, but improved Nutrition: Renal diet with fluid restriction.  Damian Leavell, PA-S Ozzie Hoyle, PA-C 06/17/2023, 10:02 AM  [facility]  I saw the patient and agree with the above assessment and plan.   Note edited by me.  Arita Miss, MD

## 2023-06-17 NOTE — Discharge Summary (Addendum)
Physician Discharge Summary  Isaly Delehanty WUX:[id number] DOB: Jul 10, 1990 DOA: 06/12/2023  PCP: Felix Pacini, FNP  Admit date: 06/12/2023  Discharge date: 06/17/2023  Admitted From: Home.  Disposition:  Home with Home Health.  Recommendations for Outpatient Follow-up:  Follow up with PCP in 1-2 weeks. Please obtain BMP/CBC in one week. Advised to follow-up with Nephrology for continuation of hemodialysis.. Medication compliance emphasized in detail.  Home Health: Home PT/OT Equipment/Devices: None  Discharge Condition: Stable CODE STATUS:Full code Diet recommendation: Heart Healthy   Brief [facility] Course: This 33 years old female with medical non-adherence, ESRD on hemodialysis, poorly controlled IDDM, Neuropathy, tobacco abuse presented in the ED with altered mental status and seizures. Patient was initially admitted in ICU.  Patient was found to have hyperglycemia w/o DKA,  NG tube was placed for decompression. She was also given a dose of IV Keppra in the ED.  Patient was also started on empiric antibiotics given sepsis.  Neurology was consulted,  stopped Keppra since seizure was secondary to hyperglycemia.  Patient was moved out of ICU to medical floor.  Brain MRI was motion degraded study but negative for any acute abnormality.  EEG no evidence of seizures.  Neurology signed off.  Patient continued on hemodialysis.  Patient felt better wants to be discharged.  Nephrology agreed with the discharge plan advised outpatient follow-up.  Discharge Diagnoses:  Principal Problem:   Seizure (HCC)   New onset Seizures: Patient has multiple potential causes for seizures (hyponatremia, hyperglycemia and uncontrolled hypertension) Appreciated neurology recommendation. Keppra stopped since this seizure was secondary to hyperglycemia. Brain MRI and EEG unremarkable. Monitor sodium, blood glucose.  Neurology signed off.   Acute encephalopathy: > Improved. Suspect  post-ictal confusion + benzo side effect: POA now resolved. Continue hemodialysis as per nephrology Renally dose medications. Seizure precautions Discussed post-seizure safety concerns.  She is aware she cannot drive x 6 months nor should he bathe / swim alone or operate heavy machinery.    Hyponatremia, hypervolemic: Monitor renal function. Avoid hypotonic fluids. Follow-up hemodialysis.   Hyperglycemia without DKA: Continue SSI PRN Diabetes coordinator recommended against long-acting insulin Goal of 140-180 will be very challenging to achieve.   Lactic acidosis: Initially suspected due to seizure, but no good explanation for rise since then. Downtrending.    Elevated liver enzymes: Large volume of ascites likely due to congestion from inadequate volume control chronically.  Hepatitis panel negative, neg acetaminophen and salicylates.   RUQ not concerning for cholecystitis. -needs volume off   Hypothyroidism: Continue Low dose synthroid. Needs OP follow up.   Possible bowel obstruction (very low suspicion) vs gastroparesis: -monitor clinically, resolved, tolerating renal diet.   Fever- suspect this is due to seizure Blood cultures no growth for 3 days.   ESRD on hemodialysis: -iHD per nephrology -renally dose meds -renal diet   Uncontrolled hypertension: Continue PTA labetalol, amlodipine.   Anemia and thrombocytopenia- both chronic  -monitor, transfuse for Hb <7 or hemodynamically significant bleeding.   Hyperlipidemia Continue statin.   GERD, likely esophageal spasms with cold liquids. -PPI -avoid cold liquids  Discharge Instructions  Discharge Instructions     Call MD for:  difficulty breathing, headache or visual disturbances   Complete by: As directed    Call MD for:  persistant dizziness or light-headedness   Complete by: As directed    Call MD for:  persistant nausea and vomiting   Complete by: As directed    Diet - low sodium heart healthy  Complete by: As directed    Diet Carb Modified   Complete by: As directed    Discharge instructions   Complete by: As directed    Advised to follow-up with primary care physician in 1 week. Advised to follow-up with nephrology for continuation of hemodialysis. Medication compliance emphasized in detail.   Increase activity slowly   Complete by: As directed       Allergies as of 06/17/2023       Reactions   Dilaudid [hydromorphone] Itching, Rash        Medication List     STOP taking these medications    Acidophilus Caps capsule   furosemide 80 MG tablet Commonly known as: LASIX   omeprazole 20 MG capsule Commonly known as: PRILOSEC       TAKE these medications    amLODipine 5 MG tablet Commonly known as: NORVASC Take 1 tablet (5 mg total) by mouth daily. Start taking on: June 18, 2023   Fiber-Lax 625 MG tablet Generic drug: polycarbophil Take 1 tablet (625 mg total) by mouth 2 (two) times daily.   gabapentin 100 MG capsule Commonly known as: NEURONTIN Take 1 capsule (100 mg total) by mouth 3 (three) times daily as needed (neuropathy).   hydrOXYzine 25 MG tablet Commonly known as: ATARAX Take 25 mg by mouth daily as needed for anxiety.   insulin lispro 100 UNIT/ML KwikPen Commonly known as: HUMALOG Inject 0-6 Units into the skin 3 (three) times daily with meals. Check Blood Glucose (BG) and inject per scale: BG <150= 0 unit; BG 150-200= 1 unit; BG 201-250= 2 unit; BG 251-300= 3 unit; BG 301-350= 4 unit; BG 351-400= 5 unit; BG >400= 6 unit and Call Primary Care.   labetalol 100 MG tablet Commonly known as: NORMODYNE Take 1 tablet (100 mg total) by mouth 2 (two) times daily.   Lantus SoloStar 100 UNIT/ML Solostar Pen Generic drug: insulin glargine Inject 3 Units into the skin daily.   levothyroxine 25 MCG tablet Commonly known as: SYNTHROID Take 1 tablet (25 mcg total) by mouth daily at 6 (six) AM. Start taking on: June 18, 2023    lidocaine-prilocaine cream Commonly known as: EMLA Apply 1 Application topically as needed (For port access).   lipase/protease/amylase 16109 UNITS Cpep capsule Commonly known as: Creon Take 2 capsules (72,000 Units total) by mouth 3 (three) times daily with meals. May also take 1 capsule (36,000 Units total) as needed (with snacks - up to 4 snacks daily).   loperamide 2 MG capsule Commonly known as: IMODIUM Take 2 capsules (4 mg total) by mouth as needed for diarrhea or loose stools. Not to exceed 8 caps per day   methocarbamol 750 MG tablet Commonly known as: ROBAXIN Take 1 tablet (750 mg total) by mouth every 8 (eight) hours as needed for muscle spasms.   ondansetron 4 MG tablet Commonly known as: Zofran Take 1 tablet (4 mg total) by mouth every 8 (eight) hours as needed for nausea or vomiting.   oxyCODONE-acetaminophen 10-325 MG tablet Commonly known as: PERCOCET Take 1 tablet by mouth every 6 (six) hours as needed for pain.   pantoprazole 40 MG tablet Commonly known as: Protonix Take 1 tablet (40 mg total) by mouth daily.   rosuvastatin 10 MG tablet Commonly known as: CRESTOR Take 1 tablet (10 mg total) by mouth daily.               Durable Medical Equipment  (From admission, onward)  Start     Ordered   06/17/23 1011  For home use only DME 4 wheeled rolling walker with seat  Once       Question:  Patient needs a walker to treat with the following condition  Answer:  Weakness   06/17/23 1010            Follow-up Information     [facility] Follow up.   Specialty: Home Health Services Why: for PT and OT social work and possible  PCS services Contact information: [address] [place] [address] [phone]         [facility] Follow up.   Why: [employer] for walker        Levan Hurst Anastasia Fiedler, FNP Follow up in 1 week(s).   Specialty: Endocrinology Contact information: [address] [place] [address] [phone]                Allergies  Allergen Reactions   Dilaudid [Hydromorphone] Itching and Rash    Consultations: Nephrology   Procedures/Studies: MR BRAIN WO CONTRAST Result Date: 06/15/2023 CLINICAL DATA:  Provided history: Seizure, new onset, no history of trauma. EXAM: MRI HEAD WITHOUT CONTRAST TECHNIQUE: Multiplanar, multiecho pulse sequences of the brain and surrounding structures were obtained without intravenous contrast. COMPARISON:  Head CT 06/13/2023.  Brain MRI 04/19/2021. FINDINGS: The patient was unable to tolerate the full examination. As a result, a routine coronal FLAIR sequence through the hippocampi could not be acquired. Additionally, the acquired sequences are intermittently motion degraded. Most notably, the sagittal T1-weighted sequence is moderate-to-severely motion degraded, the axial FLAIR sequence is moderately motion degraded, the axial T1 sequence is moderately motion degraded, the coronal T2 sequence through the hippocampi is moderately motion degraded and the coronal whole brain T2 sequence is severely motion degraded. Within these limitations, findings are as follows. Brain: Generalized cerebral volume loss, mild but unexpected for age. A few small foci of T2 FLAIR hyperintense signal abnormality scattered within the cerebral white matter are nonspecific, but likely reflect chronic small vessel ischemic disease given the patient's history of poorly controlled diabetes. No cortical encephalomalacia is identified. Within described limitations, no appreciable hippocampal size or signal asymmetry. There is no acute infarct. No evidence of an intracranial mass. No chronic intracranial blood products. No extra-axial fluid collection. No midline shift. Vascular: Maintained flow voids within the proximal large arterial vessels. Skull and upper cervical spine: Abnormal T1 hypointense marrow signal within the calvarium and visualized cervical  spine, nonspecific but likely related to the patient's history of end-stage renal disease. Sinuses/Orbits: No mass or acute finding within the imaged orbits. No significant paranasal sinus disease. IMPRESSION: 1. Prematurely terminated and significantly motion degraded examination as described. Within these limitation, findings are as follows. 2. No evidence of an acute intracranial abnormality. 3. A few small foci of T2 FLAIR hyperintense signal abnormality scattered within the cerebral white matter are nonspecific, but likely reflect chronic small vessel ischemic disease given the patient's history of poorly controlled diabetes. 4. Otherwise unremarkable MRI appearance of the brain. Electronically Signed   By: Jackey Loge D.O.   On: 06/15/2023 14:18   EEG adult Result Date: 06/13/2023 Charlsie Quest, MD     06/13/2023 10:29 AM Patient Name: Lyrica Baros MRN: [id number] Epilepsy Attending: Charlsie Quest Referring Physician/Provider: Roslynn Amble, MD Date: 06/13/2023 Duration: 22.34 mins Patient history: 33yo F presented with seizure in the setting of severe hyperglycemia. EEG to evaluate  for seizure Level of alertness: Awake AEDs during EEG study: LEV Technical aspects: This EEG study was done with scalp electrodes positioned according to the 10-20 International system of electrode placement. Electrical activity was reviewed with band pass filter of 1-70Hz , sensitivity of 7 uV/mm, display speed of 45mm/sec with a 60Hz  notched filter applied as appropriate. EEG data were recorded continuously and digitally stored.  Video monitoring was available and reviewed as appropriate. Description: The posterior dominant rhythm consists of 8 Hz activity of moderate voltage (25-35 uV) seen predominantly in posterior head regions, symmetric and reactive to eye opening and eye closing. EEG showed continuous generalized 5 to 7 Hz theta slowing. Hyperventilation and photic stimulation were not performed.    ABNORMALITY - Continuous slow, generalized IMPRESSION: This study is suggestive of mild diffuse encephalopathy. No seizures or epileptiform discharges were seen throughout the recording. Priyanka [name]   US Abdomen Limited RUQ (LIVER/GB) Result Date: 06/13/2023 CLINICAL DATA:  End-stage renal failure patient, on dialysis, with massive ascites. EXAM: ULTRASOUND ABDOMEN LIMITED RIGHT UPPER QUADRANT COMPARISON:  CT without contrast earlier today. FINDINGS: Gallbladder: Slightly thickened gallbladder free wall 3.1 cm but no stones, sludge nor positive sonographic Murphy's sign. There is fluid around the gallbladder but there is also massive ascites. The fluid can not be attributed to primary gallbladder disease. The wall thickening is probably reactive or congestive, less likely acalculous cholecystitis. Common bile duct: Diameter: 4.0 mm.  No intrahepatic bile duct dilatation. Liver: No focal lesion identified. Within normal limits in parenchymal echogenicity. Portal vein is patent on color Doppler imaging with normal direction of blood flow towards the liver. Other: Massive ascites. IMPRESSION: 1. Massive ascites. 2. Slightly thickened gallbladder free wall but no stones, sludge nor positive sonographic Murphy's sign. The wall thickening is probably reactive or congestive, less likely acalculous cholecystitis. 3. No bile duct dilatation. Electronically Signed   By: Almira Bar M.D.   On: 06/13/2023 04:42   CT Head Wo Contrast Result Date: 06/13/2023 CLINICAL DATA:  Initial evaluation for new onset seizure. EXAM: CT HEAD WITHOUT CONTRAST TECHNIQUE: Contiguous axial images were obtained from the base of the skull through the vertex without intravenous contrast. RADIATION DOSE REDUCTION: This exam was performed according to the departmental dose-optimization program which includes automated exposure control, adjustment of the mA and/or kV according to patient size and/or use of iterative reconstruction  technique. COMPARISON:  Prior study from 06/04/2022 FINDINGS: Brain: Examination limited by patient positioning. Cerebral volume within normal limits. No acute intracranial hemorrhage. No acute large vessel territory infarct. No mass lesion or midline shift. No hydrocephalus or extra-axial fluid collection. Vascular: No abnormal hyperdense vessel. Scattered vascular calcifications noted within the carotid siphons. Skull: Scalp soft tissues demonstrate no acute finding. Calvarium intact. Sinuses/Orbits: Globes orbital soft tissues within normal limits. Paranasal sinuses are clear. No mastoid effusion. Other: None. IMPRESSION: 1. No acute intracranial abnormality. 2. Calcified atherosclerosis about the skull base, advanced for age. Electronically Signed   By: Rise Mu M.D.   On: 06/13/2023 03:38   CT ABDOMEN PELVIS WO CONTRAST Result Date: 06/13/2023 CLINICAL DATA:  Seizures, vomiting, on dialysis EXAM: CT ABDOMEN AND PELVIS WITHOUT CONTRAST TECHNIQUE: Multidetector CT imaging of the abdomen and pelvis was performed following the standard protocol without IV contrast. RADIATION DOSE REDUCTION: This exam was performed according to the departmental dose-optimization program which includes automated exposure control, adjustment of the mA and/or kV according to patient size and/or use of iterative reconstruction technique. COMPARISON:  05/30/2023 FINDINGS: Lower chest:  Multifocal atelectasis, right lower lobe predominant. Small right pleural effusion. Hepatobiliary: Unenhanced liver is unremarkable. Gallbladder is unremarkable. No intrahepatic or extrahepatic duct dilatation. Pancreas: Parenchymal atrophy. Spleen: Within normal limits. Adrenals/Urinary Tract: Adrenal glands are within normal limits. Kidneys are within normal limits. No renal, ureteral, or bladder calculi. No hydronephrosis. Bladder is within normal limits. Stomach/Bowel: Stomach is within normal limits. No evidence of bowel obstruction.  Normal appendix (series 3/image 62). No colonic wall thickening or inflammatory changes. Vascular/Lymphatic: No evidence of abdominal aortic aneurysm. Atherosclerotic calcifications of the abdominal aorta and branch vessels. No suspicious abdominopelvic lymphadenopathy. Reproductive: Uterus is within normal limits. Bilateral ovaries are within normal limits. Bilateral tubal ligation clips. Other: Large volume abdominopelvic ascites. Musculoskeletal: Visualized osseous structures are within normal limits. IMPRESSION: Large volume abdominopelvic ascites. Small right pleural effusion. Multifocal atelectasis, right lower lobe predominant. Electronically Signed   By: Charline Bills M.D.   On: 06/13/2023 03:02   DG Abd Portable 1V Result Date: 06/12/2023 CLINICAL DATA:  [id number] Abdominal pain [id number] EXAM: PORTABLE ABDOMEN - 1 VIEW COMPARISON:  CT abdomen pelvis 05/30/2023 FINDINGS: Multiple centralized loops of small bowel that appear mildly distended with gas at the upper limits of normal. The stomach is filled with ingested material. No radio-opaque calculi or other significant radiographic abnormality are seen. IMPRESSION: 1. Multiple centralized loops of small bowel that appear mildly distended with gas at the upper limits of normal. Centralization consistent with known ascites. 2. Developing partial or early small-bowel obstruction not fully excluded. Electronically Signed   By: Tish Frederickson M.D.   On: 06/12/2023 21:50   DG Chest Port 1 View Result Date: 06/12/2023 CLINICAL DATA:  sob EXAM: PORTABLE CHEST 1 VIEW COMPARISON:  Chest x-ray 05/30/2023 FINDINGS: The heart and mediastinal contours are within normal limits. Low lung volumes with suggestion of patchy airspace opacities. No pulmonary edema. Stable trace right pleural effusion. Interval development of a trace left pleural effusion. Right greater than left. No pneumothorax. No acute osseous abnormality. IMPRESSION: Low lung volumes with suggestion of  developing multifocal pneumonia and bilateral trace pleural effusions, right greater than left. Consider follow-up with PA and lateral view the chest with improved inspiratory effort for better evaluation of the lungs. Electronically Signed   By: Tish Frederickson M.D.   On: 06/12/2023 21:48   ECHOCARDIOGRAM COMPLETE Result Date: 05/30/2023    ECHOCARDIOGRAM REPORT   Patient Name:   MCKALA FONTENOT Date of Exam: 05/30/2023 Medical Rec #:  [id number]               Height:       65.0 in Accession #:    [id number]              Weight:       110.0 lb Date of Birth:  10-Apr-1991               BSA:          1.534 m Patient Age:    32 years                BP:           146/101 mmHg Patient Gender: F                       HR:           93 bpm. Exam Location:  Inpatient Procedure: 2D Echo, Cardiac Doppler and Color Doppler Indications:    CHF  History:  Patient has no prior history of Echocardiogram examinations.                 Risk Factors:Hypertension and Diabetes.  Sonographer:    Webb Laws Referring Phys: [id number] JUSTIN B HOWERTER IMPRESSIONS  1. Left ventricular ejection fraction, by estimation, is 60 to 65%. The left ventricle has normal function. The left ventricle has no regional wall motion abnormalities. There is mild left ventricular hypertrophy of the septal segment. Left ventricular diastolic parameters were normal.  2. Right ventricular systolic function is normal. The right ventricular size is normal. There is severely elevated pulmonary artery systolic pressure.  3. Right atrial size was mildly dilated.  4. The mitral valve is normal in structure. No evidence of mitral valve regurgitation. No evidence of mitral stenosis.  5. Tricuspid valve regurgitation is moderate.  6. The aortic valve is tricuspid. Aortic valve regurgitation is not visualized. No aortic stenosis is present.  7. The inferior vena cava is normal in size with greater than 50% respiratory variability, suggesting right  atrial pressure of 3 mmHg. FINDINGS  Left Ventricle: Left ventricular ejection fraction, by estimation, is 60 to 65%. The left ventricle has normal function. The left ventricle has no regional wall motion abnormalities. The left ventricular internal cavity size was normal in size. There is  mild left ventricular hypertrophy of the septal segment. Left ventricular diastolic parameters were normal. Right Ventricle: The right ventricular size is normal. No increase in right ventricular wall thickness. Right ventricular systolic function is normal. There is severely elevated pulmonary artery systolic pressure. The tricuspid regurgitant velocity is 3.92 m/s, and with an assumed right atrial pressure of 3 mmHg, the estimated right ventricular systolic pressure is 64.5 mmHg. Left Atrium: Left atrial size was normal in size. Right Atrium: Right atrial size was mildly dilated. Pericardium: Trivial pericardial effusion is present. Mitral Valve: The mitral valve is normal in structure. No evidence of mitral valve regurgitation. No evidence of mitral valve stenosis. Tricuspid Valve: The tricuspid valve is normal in structure. Tricuspid valve regurgitation is moderate . No evidence of tricuspid stenosis. Aortic Valve: The aortic valve is tricuspid. Aortic valve regurgitation is not visualized. No aortic stenosis is present. Pulmonic Valve: The pulmonic valve was normal in structure. Pulmonic valve regurgitation is not visualized. No evidence of pulmonic stenosis. Aorta: The aortic root is normal in size and structure. Venous: The inferior vena cava is normal in size with greater than 50% respiratory variability, suggesting right atrial pressure of 3 mmHg. IAS/Shunts: No atrial level shunt detected by color flow Doppler.  LEFT VENTRICLE PLAX 2D LVIDd:         4.10 cm     Diastology LVIDs:         2.90 cm     LV e' lateral:   9.90 cm/s LV PW:         0.90 cm     LV E/e' lateral: 6.7 LV IVS:        1.10 cm LVOT diam:     2.10 cm LV  SV:         45 LV SV Index:   29 LVOT Area:     3.46 cm  LV Volumes (MOD) LV vol d, MOD A2C: 56.4 ml LV vol d, MOD A4C: 53.1 ml LV vol s, MOD A2C: 24.3 ml LV vol s, MOD A4C: 18.1 ml LV SV MOD A2C:     32.1 ml LV SV MOD A4C:     53.1 ml LV  SV MOD BP:      33.7 ml RIGHT VENTRICLE RV Basal diam:  3.60 cm RV S prime:     11.10 cm/s TAPSE (M-mode): 2.0 cm LEFT ATRIUM           Index        RIGHT ATRIUM           Index LA diam:      2.70 cm 1.76 cm/m   RA Area:     18.50 cm LA Vol (A2C): 22.2 ml 14.47 ml/m  RA Volume:   49.30 ml  32.13 ml/m LA Vol (A4C): 15.0 ml 9.78 ml/m  AORTIC VALVE LVOT Vmax:   78.30 cm/s LVOT Vmean:  50.100 cm/s LVOT VTI:    0.130 m  AORTA Ao Root diam: 2.60 cm Ao Asc diam:  2.90 cm MITRAL VALVE               TRICUSPID VALVE MV Area (PHT): 6.11 cm    TR Peak grad:   61.5 mmHg MV E velocity: 66.00 cm/s  TR Vmax:        392.00 cm/s MV A velocity: 55.80 cm/s MV E/A ratio:  1.18        SHUNTS                            Systemic VTI:  0.13 m                            Systemic Diam: 2.10 cm Chilton Si MD Electronically signed by Chilton Si MD Signature Date/Time: 05/30/2023/5:12:40 PM    Final    CT ABDOMEN PELVIS W CONTRAST Result Date: 05/30/2023 CLINICAL DATA:  Abdominal pain and swelling and swelling in the feet. History of end-stage renal failure and diabetes. EXAM: CT ABDOMEN AND PELVIS WITH CONTRAST TECHNIQUE: Multidetector CT imaging of the abdomen and pelvis was performed using the standard protocol following bolus administration of intravenous contrast. RADIATION DOSE REDUCTION: This exam was performed according to the departmental dose-optimization program which includes automated exposure control, adjustment of the mA and/or kV according to patient size and/or use of iterative reconstruction technique. CONTRAST:  65mL OMNIPAQUE IOHEXOL 350 MG/ML SOLN COMPARISON:  CT without contrast 04/19/2021. FINDINGS: Lower chest: There are small right and minimal left layering  pleural effusions, with atelectatic changes in both bases. There is cardiomegaly with a right chamber predominance, likely indicating chronic right heart dysfunction and also evidenced by IVC and hepatic vein contrast reflux. There are coronary artery calcifications, age-advanced. Hepatobiliary: Loss of fine detail owing to massive ascites, body wall anasarca and artifact from overlying wires and patient's arms in the field. The liver is mildly steatotic. No obvious mass is seen and no calcified gallstones or gallbladder thickening. No biliary dilatation. Pancreas: Atrophic and otherwise unremarkable. 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 No mass enhancement. No splenomegaly. Adrenals/Urinary Tract: There is bilateral renal cortical thinning. No adrenal mass or renal mass enhancement. There is air in both collecting systems which could be due to recent instrumentation or infectious process. Air in the bladder is also noted. Bladder wall is nondistended. There is no evidence of vesicoenteric fistula. Stomach/Bowel: There is mucosal enhancement of the stomach and proximal small bowel consistent with gastroenteritis. No dilatation or wall thickening in the stomach and small bowel. The appendix is normal. There is either wall thickening or nondistention in the ascending,  transverse and descending colon. Scattered sigmoid diverticula. Correlate clinically for underlying colitis. Unable to tell if there is adjacent inflammatory change due to massive ascites. Vascular/Lymphatic: Age-advanced aortic and branch vessel atherosclerosis. No AAA. No adenopathy is seen through the ascites. Reproductive: The uterus is intact. The ovaries are not enlarged. There are bilateral BTL clips, seen previously. Other: massive ascites and diffuse body wall anasarca. No free air. No free air hemorrhage or localizing collections. The ascites displaces the bowel inward and extends up around the liver and spleen. There  is no incarcerated hernia. Musculoskeletal: No acute or significant osseous findings. IMPRESSION: 1. Massive ascites and diffuse body wall anasarca. 2. Small right and minimal left pleural effusions. Basilar atelectasis. 3. Cardiomegaly with right chamber predominance, likely indicating chronic right heart dysfunction. 4. Age-advanced atherosclerosis. 5. Air in the renal collecting systems and bladder which could be due to recent instrumentation or infectious process. 6. Mucosal enhancement in the stomach and proximal small bowel consistent with gastroenteritis. 7. Wall thickening versus nondistention in the ascending, transverse and descending colon. Correlate clinically for underlying colitis. 8. Diverticulosis. 9. Aortic atherosclerosis. Aortic Atherosclerosis (ICD10-I70.0). Electronically Signed   By: Almira Bar M.D.   On: 05/30/2023 02:25   DG Chest Portable 1 View Result Date: 05/30/2023 CLINICAL DATA:  Abdominal pain with extremity swelling. Check volume status. EXAM: PORTABLE CHEST 1 VIEW COMPARISON:  Portable chest 02/02/2023 FINDINGS: There is a small right pleural effusion which was seen previously, with overlying atelectasis. Low inspiration on exam. There is increased left perihilar linear atelectasis. The hypoinflated lungs otherwise appear clear. There is no substantial left effusion. The heart silhouette is enlarged but probably exaggerated in part due to low inspiration. There is mild central vascular prominence which could be due to low lung volumes or mild perihilar vascular congestion. No edema is seen. The mediastinum is normally outlined.  The thoracic cage is intact. IMPRESSION: 1. Low inspiration on exam. 2. Small right pleural effusion with overlying atelectasis. 3. Increased left perihilar linear atelectasis. No visible infiltrate. 4. Cardiomegaly. 5. Mild central vascular prominence which could be due to low lung volumes or mild perihilar vascular congestion. No edema is seen.  Electronically Signed   By: Almira Bar M.D.   On: 05/30/2023 00:41   Subjective: Patient was seen and examined at bedside.  Overnight events noted.   Patient reports doing much better and wants to be discharged.  Discharge Exam: Vitals:   06/17/23 0436 06/17/23 0918  BP: (!) 154/107 138/85  Pulse: 83 79  Resp: 20 18  Temp: (!) 97.5 F (36.4 C) 98.3 F (36.8 C)  SpO2: 100% 100%   Vitals:   06/16/23 1819 06/16/23 2108 06/17/23 0436 06/17/23 0918  BP:  (!) 154/88 (!) 154/107 138/85  Pulse:  88 83 79  Resp:  20 20 18   Temp:  98.1 F (36.7 C) (!) 97.5 F (36.4 C) 98.3 F (36.8 C)  TempSrc:  Oral Oral   SpO2:  100% 100% 100%  Weight: 53.5 kg     Height:        General: Pt is alert, awake, not in acute distress Cardiovascular: RRR, S1/S2 +, no rubs, no gallops Respiratory: CTA bilaterally, no wheezing, no rhonchi Abdominal: Soft, NT, ND, bowel sounds + Extremities: no edema, no cyanosis    The results of significant diagnostics from this hospitalization (including imaging, microbiology, ancillary and laboratory) are listed below for reference.     Microbiology: Recent Results (from the past 240 hours)  MRSA  Next Gen by PCR, Nasal     Status: None   Collection Time: 06/13/23  1:22 AM   Specimen: Nasal Mucosa; Nasal Swab  Result Value Ref Range Status   MRSA by PCR Next Gen NOT DETECTED NOT DETECTED Final    Comment: (NOTE) The GeneXpert MRSA Assay (FDA approved for NASAL specimens only), is one component of a comprehensive MRSA colonization surveillance program. It is not intended to diagnose MRSA infection nor to guide or monitor treatment for MRSA infections. Test performance is not FDA approved in patients less than 32 years old. Performed at [facility], [address], [place], [place] [address]   Culture, blood (Routine X 2) w Reflex to ID Panel     Status: None (Preliminary result)   Collection Time: 06/13/23  2:26 AM   Specimen: BLOOD  Result  Value Ref Range Status   Specimen Description BLOOD BLOOD RIGHT ARM  Final   Special Requests   Final    BOTTLES DRAWN AEROBIC ONLY Blood Culture results may not be optimal due to an inadequate volume of blood received in culture bottles   Culture   Final    NO GROWTH 4 DAYS Performed at [facility], [address], [place], [place] [address]    Report Status PENDING  Incomplete  Culture, blood (Routine X 2) w Reflex to ID Panel     Status: None (Preliminary result)   Collection Time: 06/13/23  2:26 AM   Specimen: BLOOD  Result Value Ref Range Status   Specimen Description BLOOD BLOOD RIGHT HAND  Final   Special Requests   Final    BOTTLES DRAWN AEROBIC AND ANAEROBIC Blood Culture results may not be optimal due to an inadequate volume of blood received in culture bottles   Culture   Final    NO GROWTH 4 DAYS Performed at [facility], [address] [place], [place] [address]    Report Status PENDING  Incomplete     Labs: BNP (last 3 results) No results for input(s): "BNP" in the last 8760 hours. Basic Metabolic Panel: Recent Labs  Lab 06/13/23 0226 06/13/23 0630 06/13/23 0955 06/13/23 1913 06/14/23 0815 06/15/23 0853  NA  --  127* 125* 123* 122* 125*  K  --  3.2* 2.9* 2.7* 3.5 4.5  CL  --  89* 88* 90* 89* 92*  CO2  --  22 22 24 24 25   GLUCOSE  --  605* 369* 32* 123* 216*  BUN  --  14 15 16 16 12   CREATININE  --  5.00* 5.12* 5.27* 5.41* 4.09*  CALCIUM  --  7.5* 7.8* 7.4* 7.3* 7.3*  MG 1.6* 1.6*  --   --   --   --   PHOS 2.5 2.2*  --   --  1.6* 2.1*   Liver Function Tests: Recent Labs  Lab 06/12/23 2101 06/13/23 0630 06/14/23 0815 06/15/23 0853  AST 388* 139* 76*  --   ALT 86* 56* 33  --   ALKPHOS 297* 217* 163*  --   BILITOT 0.9 1.0 1.1  --   PROT 7.9 6.8 5.6*  --   ALBUMIN 3.2* 2.7* 2.1*  2.1* 2.3*   Recent Labs  Lab 06/12/23 2101 06/13/23 0226  LIPASE 25  --   AMYLASE  --  27*   No results for input(s): "AMMONIA" in the last 168  hours. CBC: Recent Labs  Lab 06/12/23 2101 06/12/23 2130 06/12/23 2201 06/13/23 0957  06/14/23 0817  WBC 2.3*  --   --  6.5 4.3  NEUTROABS 1.3*  --   --  5.5  --   HGB 9.7* 12.9  12.9 10.2* 8.5* 7.9*  HCT 34.6* 38.0  38.0 30.0* 27.2* 25.1*  MCV 96.9  --   --  87.5 86.9  PLT 85*  --   --  75* 74*   Cardiac Enzymes: Recent Labs  Lab 06/13/23 0226  CKTOTAL 45   BNP: Invalid input(s): "POCBNP" CBG: Recent Labs  Lab 06/17/23 0002 06/17/23 0419 06/17/23 0741 06/17/23 1148 06/17/23 1151  GLUCAP 282* 78 112* >600* 251*   D-Dimer No results for input(s): "DDIMER" in the last 72 hours. Hgb A1c No results for input(s): "HGBA1C" in the last 72 hours. Lipid Profile No results for input(s): "CHOL", "HDL", "LDLCALC", "TRIG", "CHOLHDL", "LDLDIRECT" in the last 72 hours. Thyroid function studies No results for input(s): "TSH", "T4TOTAL", "T3FREE", "THYROIDAB" in the last 72 hours.  Invalid input(s): "FREET3" Anemia work up No results for input(s): "VITAMINB12", "FOLATE", "FERRITIN", "TIBC", "IRON", "RETICCTPCT" in the last 72 hours. Urinalysis    Component Value Date/Time   COLORURINE AMBER (A) 05/30/2023 0605   APPEARANCEUR TURBID (A) 05/30/2023 0605   LABSPEC 1.014 05/30/2023 0605   PHURINE 5.0 05/30/2023 0605   GLUCOSEU >=500 (A) 05/30/2023 0605   HGBUR LARGE (A) 05/30/2023 0605   BILIRUBINUR NEGATIVE 05/30/2023 0605   KETONESUR NEGATIVE 05/30/2023 0605   PROTEINUR 100 (A) 05/30/2023 0605   UROBILINOGEN 0.2 11/06/2015 1011   NITRITE NEGATIVE 05/30/2023 0605   LEUKOCYTESUR SMALL (A) 05/30/2023 0605   Sepsis Labs Recent Labs  Lab 06/12/23 2101 06/13/23 0957 06/14/23 0817  WBC 2.3* 6.5 4.3   Microbiology Recent Results (from the past 240 hours)  MRSA Next Gen by PCR, Nasal     Status: None   Collection Time: 06/13/23  1:22 AM   Specimen: Nasal Mucosa; Nasal Swab  Result Value Ref Range Status   MRSA by PCR Next Gen NOT DETECTED NOT DETECTED Final     Comment: (NOTE) The GeneXpert MRSA Assay (FDA approved for NASAL specimens only), is one component of a comprehensive MRSA colonization surveillance program. It is not intended to diagnose MRSA infection nor to guide or monitor treatment for MRSA infections. Test performance is not FDA approved in patients less than 41 years old. Performed at [facility], [address], [place], [place] [address]   Culture, blood (Routine X 2) w Reflex to ID Panel     Status: None (Preliminary result)   Collection Time: 06/13/23  2:26 AM   Specimen: BLOOD  Result Value Ref Range Status   Specimen Description BLOOD BLOOD RIGHT ARM  Final   Special Requests   Final    BOTTLES DRAWN AEROBIC ONLY Blood Culture results may not be optimal due to an inadequate volume of blood received in culture bottles   Culture   Final    NO GROWTH 4 DAYS Performed at [facility], [address], [place], [place] [address]    Report Status PENDING  Incomplete  Culture, blood (Routine X 2) w Reflex to ID Panel     Status: None (Preliminary result)   Collection Time: 06/13/23  2:26 AM   Specimen: BLOOD  Result Value Ref Range Status   Specimen Description BLOOD BLOOD RIGHT HAND  Final   Special Requests   Final    BOTTLES DRAWN AEROBIC AND ANAEROBIC Blood Culture results may not be optimal due  to an inadequate volume of blood received in culture bottles   Culture   Final    NO GROWTH 4 DAYS Performed at [facility], [address], [place], [place] [address]    Report Status PENDING  Incomplete     Time coordinating discharge: Over 30 minutes  SIGNED:   Willeen Niece, MD  [facility] 06/17/2023, 2:52 PM Pager   If 7PM-7AM, please contact night-coverage

## 2023-06-18 ENCOUNTER — Telehealth (HOSPITAL_COMMUNITY): Payer: Self-pay | Admitting: Nephrology

## 2023-06-18 LAB — CULTURE, BLOOD (ROUTINE X 2)
Culture: NO GROWTH
Culture: NO GROWTH

## 2023-06-18 NOTE — Telephone Encounter (Signed)
Transition of Care - Initial Contact from Inpatient Facility  Date of discharge: 06/17/23 Date of contact: 06/18/23 Method: Phone Spoke to: Patient  Patient contacted to discuss transition of care from recent inpatient hospitalization. Patient was admitted to [facility] from 1/12-1/17/25 with discharge diagnosis of DKA/hyperglycemia + seizure.  The discharge medication list was reviewed. Patient understands the changes and has no concerns. Reminded on insulin, better DM control.  Patient will return to his/her outpatient HD unit on: Monday. We had arranged an extra HD for her this AM - she decided not to go - says "I feel great."  No other concerns at this time.   Ozzie Hoyle, PA-C [employer] Pager [phone]

## 2023-07-16 ENCOUNTER — Emergency Department (HOSPITAL_COMMUNITY): Payer: 59

## 2023-07-16 ENCOUNTER — Encounter (HOSPITAL_COMMUNITY): Payer: Self-pay

## 2023-07-16 ENCOUNTER — Observation Stay (HOSPITAL_COMMUNITY): Payer: 59

## 2023-07-16 ENCOUNTER — Inpatient Hospital Stay (HOSPITAL_COMMUNITY)
Admission: EM | Admit: 2023-07-16 | Discharge: 2023-07-30 | DRG: 637 | Disposition: E | Payer: 59 | Attending: Critical Care Medicine | Admitting: Critical Care Medicine

## 2023-07-16 ENCOUNTER — Other Ambulatory Visit: Payer: Self-pay

## 2023-07-16 DIAGNOSIS — E162 Hypoglycemia, unspecified: Secondary | ICD-10-CM | POA: Diagnosis not present

## 2023-07-16 DIAGNOSIS — E871 Hypo-osmolality and hyponatremia: Secondary | ICD-10-CM | POA: Diagnosis present

## 2023-07-16 DIAGNOSIS — R54 Age-related physical debility: Secondary | ICD-10-CM | POA: Diagnosis present

## 2023-07-16 DIAGNOSIS — E109 Type 1 diabetes mellitus without complications: Secondary | ICD-10-CM | POA: Diagnosis present

## 2023-07-16 DIAGNOSIS — E8770 Fluid overload, unspecified: Secondary | ICD-10-CM | POA: Diagnosis present

## 2023-07-16 DIAGNOSIS — E43 Unspecified severe protein-calorie malnutrition: Secondary | ICD-10-CM | POA: Insufficient documentation

## 2023-07-16 DIAGNOSIS — D649 Anemia, unspecified: Secondary | ICD-10-CM | POA: Diagnosis present

## 2023-07-16 DIAGNOSIS — G9340 Encephalopathy, unspecified: Secondary | ICD-10-CM | POA: Diagnosis present

## 2023-07-16 DIAGNOSIS — E872 Acidosis, unspecified: Secondary | ICD-10-CM | POA: Diagnosis present

## 2023-07-16 DIAGNOSIS — Z79890 Hormone replacement therapy (postmenopausal): Secondary | ICD-10-CM

## 2023-07-16 DIAGNOSIS — N186 End stage renal disease: Secondary | ICD-10-CM | POA: Diagnosis not present

## 2023-07-16 DIAGNOSIS — Z83438 Family history of other disorder of lipoprotein metabolism and other lipidemia: Secondary | ICD-10-CM

## 2023-07-16 DIAGNOSIS — E10649 Type 1 diabetes mellitus with hypoglycemia without coma: Principal | ICD-10-CM | POA: Diagnosis present

## 2023-07-16 DIAGNOSIS — I3139 Other pericardial effusion (noninflammatory): Secondary | ICD-10-CM | POA: Diagnosis not present

## 2023-07-16 DIAGNOSIS — K8689 Other specified diseases of pancreas: Secondary | ICD-10-CM | POA: Diagnosis present

## 2023-07-16 DIAGNOSIS — R188 Other ascites: Secondary | ICD-10-CM | POA: Diagnosis present

## 2023-07-16 DIAGNOSIS — Z515 Encounter for palliative care: Secondary | ICD-10-CM

## 2023-07-16 DIAGNOSIS — I5082 Biventricular heart failure: Secondary | ICD-10-CM | POA: Diagnosis present

## 2023-07-16 DIAGNOSIS — E785 Hyperlipidemia, unspecified: Secondary | ICD-10-CM | POA: Diagnosis present

## 2023-07-16 DIAGNOSIS — Z79899 Other long term (current) drug therapy: Secondary | ICD-10-CM

## 2023-07-16 DIAGNOSIS — R569 Unspecified convulsions: Secondary | ICD-10-CM

## 2023-07-16 DIAGNOSIS — I10 Essential (primary) hypertension: Secondary | ICD-10-CM | POA: Diagnosis present

## 2023-07-16 DIAGNOSIS — G4089 Other seizures: Secondary | ICD-10-CM | POA: Diagnosis present

## 2023-07-16 DIAGNOSIS — Z992 Dependence on renal dialysis: Secondary | ICD-10-CM

## 2023-07-16 DIAGNOSIS — E7800 Pure hypercholesterolemia, unspecified: Secondary | ICD-10-CM | POA: Diagnosis present

## 2023-07-16 DIAGNOSIS — D72819 Decreased white blood cell count, unspecified: Secondary | ICD-10-CM | POA: Diagnosis present

## 2023-07-16 DIAGNOSIS — Z823 Family history of stroke: Secondary | ICD-10-CM

## 2023-07-16 DIAGNOSIS — G9341 Metabolic encephalopathy: Secondary | ICD-10-CM | POA: Diagnosis present

## 2023-07-16 DIAGNOSIS — G629 Polyneuropathy, unspecified: Secondary | ICD-10-CM

## 2023-07-16 DIAGNOSIS — Z841 Family history of disorders of kidney and ureter: Secondary | ICD-10-CM

## 2023-07-16 DIAGNOSIS — R9431 Abnormal electrocardiogram [ECG] [EKG]: Secondary | ICD-10-CM

## 2023-07-16 DIAGNOSIS — N2581 Secondary hyperparathyroidism of renal origin: Secondary | ICD-10-CM | POA: Diagnosis present

## 2023-07-16 DIAGNOSIS — Z6820 Body mass index (BMI) 20.0-20.9, adult: Secondary | ICD-10-CM

## 2023-07-16 DIAGNOSIS — D631 Anemia in chronic kidney disease: Secondary | ICD-10-CM | POA: Diagnosis present

## 2023-07-16 DIAGNOSIS — I5022 Chronic systolic (congestive) heart failure: Secondary | ICD-10-CM | POA: Diagnosis not present

## 2023-07-16 DIAGNOSIS — F17210 Nicotine dependence, cigarettes, uncomplicated: Secondary | ICD-10-CM | POA: Diagnosis present

## 2023-07-16 DIAGNOSIS — D696 Thrombocytopenia, unspecified: Secondary | ICD-10-CM | POA: Diagnosis present

## 2023-07-16 DIAGNOSIS — I132 Hypertensive heart and chronic kidney disease with heart failure and with stage 5 chronic kidney disease, or end stage renal disease: Secondary | ICD-10-CM | POA: Diagnosis present

## 2023-07-16 DIAGNOSIS — I462 Cardiac arrest due to underlying cardiac condition: Secondary | ICD-10-CM | POA: Diagnosis not present

## 2023-07-16 DIAGNOSIS — K429 Umbilical hernia without obstruction or gangrene: Secondary | ICD-10-CM | POA: Diagnosis present

## 2023-07-16 DIAGNOSIS — K7460 Unspecified cirrhosis of liver: Secondary | ICD-10-CM | POA: Diagnosis present

## 2023-07-16 DIAGNOSIS — Z794 Long term (current) use of insulin: Secondary | ICD-10-CM

## 2023-07-16 DIAGNOSIS — Z8249 Family history of ischemic heart disease and other diseases of the circulatory system: Secondary | ICD-10-CM

## 2023-07-16 DIAGNOSIS — N1832 Chronic kidney disease, stage 3b: Secondary | ICD-10-CM | POA: Diagnosis not present

## 2023-07-16 DIAGNOSIS — R64 Cachexia: Secondary | ICD-10-CM | POA: Diagnosis present

## 2023-07-16 DIAGNOSIS — I82611 Acute embolism and thrombosis of superficial veins of right upper extremity: Secondary | ICD-10-CM | POA: Diagnosis present

## 2023-07-16 DIAGNOSIS — Z885 Allergy status to narcotic agent status: Secondary | ICD-10-CM

## 2023-07-16 DIAGNOSIS — I469 Cardiac arrest, cause unspecified: Secondary | ICD-10-CM

## 2023-07-16 DIAGNOSIS — E1042 Type 1 diabetes mellitus with diabetic polyneuropathy: Secondary | ICD-10-CM | POA: Diagnosis present

## 2023-07-16 DIAGNOSIS — R570 Cardiogenic shock: Secondary | ICD-10-CM | POA: Diagnosis not present

## 2023-07-16 DIAGNOSIS — E876 Hypokalemia: Secondary | ICD-10-CM | POA: Diagnosis present

## 2023-07-16 DIAGNOSIS — E1022 Type 1 diabetes mellitus with diabetic chronic kidney disease: Secondary | ICD-10-CM | POA: Diagnosis present

## 2023-07-16 DIAGNOSIS — Z833 Family history of diabetes mellitus: Secondary | ICD-10-CM

## 2023-07-16 DIAGNOSIS — Z1152 Encounter for screening for COVID-19: Secondary | ICD-10-CM

## 2023-07-16 DIAGNOSIS — E039 Hypothyroidism, unspecified: Secondary | ICD-10-CM | POA: Diagnosis present

## 2023-07-16 DIAGNOSIS — J9601 Acute respiratory failure with hypoxia: Secondary | ICD-10-CM | POA: Diagnosis not present

## 2023-07-16 LAB — COMPREHENSIVE METABOLIC PANEL
ALT: 14 U/L (ref 0–44)
AST: 26 U/L (ref 15–41)
Albumin: 2.4 g/dL — ABNORMAL LOW (ref 3.5–5.0)
Alkaline Phosphatase: 147 U/L — ABNORMAL HIGH (ref 38–126)
Anion gap: 13 (ref 5–15)
BUN: 37 mg/dL — ABNORMAL HIGH (ref 6–20)
CO2: 21 mmol/L — ABNORMAL LOW (ref 22–32)
Calcium: 7.8 mg/dL — ABNORMAL LOW (ref 8.9–10.3)
Chloride: 96 mmol/L — ABNORMAL LOW (ref 98–111)
Creatinine, Ser: 5.05 mg/dL — ABNORMAL HIGH (ref 0.44–1.00)
GFR, Estimated: 11 mL/min — ABNORMAL LOW (ref >60)
Glucose, Bld: 28 mg/dL — CL (ref 70–99)
Potassium: 5.1 mmol/L (ref 3.5–5.1)
Sodium: 130 mmol/L — ABNORMAL LOW (ref 135–145)
Total Bilirubin: 1.4 mg/dL — ABNORMAL HIGH (ref 0.0–1.2)
Total Protein: 6 g/dL — ABNORMAL LOW (ref 6.5–8.1)

## 2023-07-16 LAB — I-STAT VENOUS BLOOD GAS, ED
Acid-base deficit: 2 mmol/L (ref 0.0–2.0)
Bicarbonate: 20 mmol/L (ref 20.0–28.0)
Calcium, Ion: 0.8 mmol/L — CL (ref 1.15–1.40)
HCT: 32 % — ABNORMAL LOW (ref 36.0–46.0)
Hemoglobin: 10.9 g/dL — ABNORMAL LOW (ref 12.0–15.0)
O2 Saturation: 100 %
Potassium: 4.6 mmol/L (ref 3.5–5.1)
Sodium: 132 mmol/L — ABNORMAL LOW (ref 135–145)
TCO2: 21 mmol/L — ABNORMAL LOW (ref 22–32)
pCO2, Ven: 25 mm[Hg] — ABNORMAL LOW (ref 44–60)
pH, Ven: 7.511 — ABNORMAL HIGH (ref 7.25–7.43)
pO2, Ven: 171 mm[Hg] — ABNORMAL HIGH (ref 32–45)

## 2023-07-16 LAB — CBC WITH DIFFERENTIAL/PLATELET
Abs Immature Granulocytes: 0 10*3/uL (ref 0.00–0.07)
Basophils Absolute: 0 10*3/uL (ref 0.0–0.1)
Basophils Relative: 1 %
Eosinophils Absolute: 0.1 10*3/uL (ref 0.0–0.5)
Eosinophils Relative: 2 %
HCT: 39.9 % (ref 36.0–46.0)
Hemoglobin: 12.1 g/dL (ref 12.0–15.0)
Lymphocytes Relative: 35 %
Lymphs Abs: 0.9 10*3/uL (ref 0.7–4.0)
MCH: 28.8 pg (ref 26.0–34.0)
MCHC: 30.3 g/dL (ref 30.0–36.0)
MCV: 95 fL (ref 80.0–100.0)
Monocytes Absolute: 0.1 10*3/uL (ref 0.1–1.0)
Monocytes Relative: 2 %
Neutro Abs: 1.6 10*3/uL — ABNORMAL LOW (ref 1.7–7.7)
Neutrophils Relative %: 60 %
Platelets: 101 10*3/uL — ABNORMAL LOW (ref 150–400)
RBC: 4.2 MIL/uL (ref 3.87–5.11)
RDW: 17.4 % — ABNORMAL HIGH (ref 11.5–15.5)
WBC: 2.6 10*3/uL — ABNORMAL LOW (ref 4.0–10.5)
nRBC: 0 /100{WBCs}
nRBC: 1.1 % — ABNORMAL HIGH (ref 0.0–0.2)

## 2023-07-16 LAB — I-STAT CHEM 8, ED
BUN: 40 mg/dL — ABNORMAL HIGH (ref 6–20)
Calcium, Ion: 0.9 mmol/L — ABNORMAL LOW (ref 1.15–1.40)
Chloride: 100 mmol/L (ref 98–111)
Creatinine, Ser: 5.5 mg/dL — ABNORMAL HIGH (ref 0.44–1.00)
Glucose, Bld: 25 mg/dL — CL (ref 70–99)
HCT: 42 % (ref 36.0–46.0)
Hemoglobin: 14.3 g/dL (ref 12.0–15.0)
Potassium: 5.2 mmol/L — ABNORMAL HIGH (ref 3.5–5.1)
Sodium: 130 mmol/L — ABNORMAL LOW (ref 135–145)
TCO2: 23 mmol/L (ref 22–32)

## 2023-07-16 LAB — RESP PANEL BY RT-PCR (RSV, FLU A&B, COVID)  RVPGX2
Influenza A by PCR: NEGATIVE
Influenza B by PCR: NEGATIVE
Resp Syncytial Virus by PCR: NEGATIVE
SARS Coronavirus 2 by RT PCR: NEGATIVE

## 2023-07-16 LAB — CBG MONITORING, ED
Glucose-Capillary: 104 mg/dL — ABNORMAL HIGH (ref 70–99)
Glucose-Capillary: 145 mg/dL — ABNORMAL HIGH (ref 70–99)
Glucose-Capillary: 21 mg/dL — CL (ref 70–99)
Glucose-Capillary: 40 mg/dL — CL (ref 70–99)
Glucose-Capillary: 52 mg/dL — ABNORMAL LOW (ref 70–99)
Glucose-Capillary: 55 mg/dL — ABNORMAL LOW (ref 70–99)
Glucose-Capillary: 86 mg/dL (ref 70–99)

## 2023-07-16 LAB — I-STAT CG4 LACTIC ACID, ED
Lactic Acid, Venous: 2 mmol/L (ref 0.5–1.9)
Lactic Acid, Venous: 1.6 mmol/L (ref 0.5–1.9)

## 2023-07-16 LAB — CK: Total CK: 95 U/L (ref 38–234)

## 2023-07-16 LAB — TSH: TSH: 5.432 u[IU]/mL — ABNORMAL HIGH (ref 0.350–4.500)

## 2023-07-16 LAB — TROPONIN I (HIGH SENSITIVITY)
Troponin I (High Sensitivity): 13 ng/L (ref <18)
Troponin I (High Sensitivity): 13 ng/L (ref <18)

## 2023-07-16 MED ORDER — DEXTROSE 50 % IV SOLN
1.0000 | Freq: Once | INTRAVENOUS | Status: AC
  Administered 2023-07-16: 50 mL via INTRAVENOUS

## 2023-07-16 MED ORDER — DEXTROSE 50 % IV SOLN
INTRAVENOUS | Status: AC
  Administered 2023-07-16: 50 mL via INTRAVENOUS
  Filled 2023-07-16: qty 50

## 2023-07-16 MED ORDER — ACETAMINOPHEN 650 MG RE SUPP: 650.0000 mg | Freq: Four times a day (QID) | RECTAL | Status: DC | PRN

## 2023-07-16 MED ORDER — GABAPENTIN 100 MG PO CAPS
100.0000 mg | ORAL_CAPSULE | Freq: Three times a day (TID) | ORAL | Status: DC | PRN
  Administered 2023-07-17: 100 mg via ORAL
  Filled 2023-07-16: qty 1

## 2023-07-16 MED ORDER — DEXTROSE 10 % IV SOLN
INTRAVENOUS | Status: DC
  Administered 2023-07-17: 1000 mL via INTRAVENOUS
  Filled 2023-07-16 (×2): qty 1000

## 2023-07-16 MED ORDER — ROSUVASTATIN CALCIUM 5 MG PO TABS
10.0000 mg | ORAL_TABLET | Freq: Every day | ORAL | Status: DC
  Administered 2023-07-17 – 2023-07-18 (×2): 10 mg via ORAL
  Filled 2023-07-16 (×2): qty 2

## 2023-07-16 MED ORDER — ACETAMINOPHEN 325 MG PO TABS
650.0000 mg | ORAL_TABLET | Freq: Four times a day (QID) | ORAL | Status: DC | PRN
  Administered 2023-07-17: 650 mg via ORAL
  Filled 2023-07-16: qty 2

## 2023-07-16 MED ORDER — VANCOMYCIN HCL IN DEXTROSE 1-5 GM/200ML-% IV SOLN
1000.0000 mg | Freq: Once | INTRAVENOUS | Status: AC
  Administered 2023-07-16: 1000 mg via INTRAVENOUS
  Filled 2023-07-16: qty 200

## 2023-07-16 MED ORDER — DEXTROSE 50 % IV SOLN: 1.0000 | Freq: Once | INTRAVENOUS | Status: AC

## 2023-07-16 MED ORDER — SODIUM CHLORIDE 0.9% FLUSH
3.0000 mL | Freq: Two times a day (BID) | INTRAVENOUS | Status: DC
  Administered 2023-07-16 – 2023-07-20 (×7): 3 mL via INTRAVENOUS

## 2023-07-16 MED ORDER — SODIUM CHLORIDE 0.9 % IV SOLN
1.0000 g | Freq: Once | INTRAVENOUS | Status: AC
  Administered 2023-07-16: 1 g via INTRAVENOUS
  Filled 2023-07-16: qty 10

## 2023-07-16 MED ORDER — FENTANYL CITRATE PF 50 MCG/ML IJ SOSY
12.5000 ug | PREFILLED_SYRINGE | Freq: Once | INTRAMUSCULAR | Status: AC
  Administered 2023-07-16: 25 ug via INTRAVENOUS
  Filled 2023-07-16: qty 1

## 2023-07-16 MED ORDER — PANCRELIPASE (LIP-PROT-AMYL) 36000-114000 UNITS PO CPEP: 36000.0000 [IU] | ORAL_CAPSULE | ORAL | Status: DC | PRN

## 2023-07-16 MED ORDER — DEXTROSE 50 % IV SOLN
INTRAVENOUS | Status: AC
  Filled 2023-07-16: qty 50

## 2023-07-16 MED ORDER — LABETALOL HCL 100 MG PO TABS
100.0000 mg | ORAL_TABLET | Freq: Two times a day (BID) | ORAL | Status: DC
  Administered 2023-07-17 – 2023-07-18 (×3): 100 mg via ORAL
  Filled 2023-07-16 (×3): qty 1

## 2023-07-16 MED ORDER — CHLORHEXIDINE GLUCONATE CLOTH 2 % EX PADS
6.0000 | MEDICATED_PAD | Freq: Every day | CUTANEOUS | Status: DC
  Administered 2023-07-18 – 2023-07-21 (×4): 6 via TOPICAL

## 2023-07-16 MED ORDER — PANTOPRAZOLE SODIUM 40 MG PO TBEC
40.0000 mg | DELAYED_RELEASE_TABLET | Freq: Every day | ORAL | Status: DC
  Administered 2023-07-17 – 2023-07-18 (×2): 40 mg via ORAL
  Filled 2023-07-16 (×2): qty 1

## 2023-07-16 MED ORDER — PANCRELIPASE (LIP-PROT-AMYL) 36000-114000 UNITS PO CPEP
72000.0000 [IU] | ORAL_CAPSULE | Freq: Three times a day (TID) | ORAL | Status: DC
  Administered 2023-07-17 (×2): 72000 [IU] via ORAL
  Filled 2023-07-16 (×10): qty 2

## 2023-07-16 MED ORDER — LEVOTHYROXINE SODIUM 25 MCG PO TABS
25.0000 ug | ORAL_TABLET | Freq: Every day | ORAL | Status: DC
  Administered 2023-07-18: 25 ug via ORAL
  Filled 2023-07-16: qty 1

## 2023-07-16 MED ORDER — LOPERAMIDE HCL 2 MG PO CAPS: 4.0000 mg | ORAL_CAPSULE | ORAL | Status: DC | PRN

## 2023-07-16 MED ORDER — HEPARIN SODIUM (PORCINE) 5000 UNIT/ML IJ SOLN
5000.0000 [IU] | Freq: Three times a day (TID) | INTRAMUSCULAR | Status: DC
  Administered 2023-07-17 – 2023-07-20 (×6): 5000 [IU] via SUBCUTANEOUS
  Filled 2023-07-16 (×8): qty 1

## 2023-07-16 MED ORDER — AMLODIPINE BESYLATE 5 MG PO TABS
5.0000 mg | ORAL_TABLET | Freq: Every day | ORAL | Status: DC
Start: 2023-07-17 — End: 2023-07-17
  Administered 2023-07-17: 5 mg via ORAL
  Filled 2023-07-16: qty 1

## 2023-07-16 MED ORDER — SODIUM CHLORIDE 0.9 % IV BOLUS: 500.0000 mL | Freq: Once | INTRAVENOUS | Status: DC

## 2023-07-16 MED ORDER — GLUCOSE 40 % PO GEL
1.0000 | Freq: Once | ORAL | Status: AC | PRN
  Administered 2023-07-17: 31 g via ORAL
  Filled 2023-07-16: qty 1.21

## 2023-07-16 MED ORDER — ONDANSETRON HCL 4 MG/2ML IJ SOLN
4.0000 mg | Freq: Once | INTRAMUSCULAR | Status: AC
  Administered 2023-07-16: 4 mg via INTRAVENOUS
  Filled 2023-07-16: qty 2

## 2023-07-16 MED ORDER — LABETALOL HCL 200 MG PO TABS: 100.0000 mg | ORAL_TABLET | Freq: Two times a day (BID) | ORAL | Status: DC

## 2023-07-16 MED ORDER — POLYETHYLENE GLYCOL 3350 17 G PO PACK: 17.0000 g | PACK | Freq: Every day | ORAL | Status: DC | PRN

## 2023-07-16 NOTE — Progress Notes (Signed)
ED Pharmacy Antibiotic Sign Off An antibiotic consult was received from an ED provider for cefepime and vancomycin per pharmacy dosing for sepsis. A chart review was completed to assess appropriateness.  The following one time order(s) were placed per pharmacy consult:  cefepime 1000 mg x 1 dose vancomycin 1000 mg x 1 dose  Further antibiotic and/or antibiotic pharmacy consults should be ordered by the admitting provider if indicated.   Thank you for allowing pharmacy to be a part of this patient's care.   Delmar Landau, PharmD, BCPS 07/16/2023 1:49 PM ED Clinical Pharmacist -  [phone]

## 2023-07-16 NOTE — ED Triage Notes (Signed)
Pt BIB [facility] form home d/t son calling that she was sick & not acting right. EMS arrived on scene & reports her CBG was 32 & had not eaten anything today (per son) & she was GCS of 4 & only responding to pain. No IV was successful & did receive 1 mg IM glucagon. Her CBG was then 51 & GCS was 9, was more responsive but still not responding directly to her name (per EMS). 148/104, 88 bpm-sinus arrhythmia, 100% on NRB, cap 33, 20 resp. Is a dialysis pt with Lt arm restricted & was last treated on Wednesday.

## 2023-07-16 NOTE — ED Notes (Signed)
Placed on nasal cannula for comfort

## 2023-07-16 NOTE — ED Notes (Signed)
Phlebotomy contacted to obtain remaining labs

## 2023-07-16 NOTE — Consult Note (Signed)
Renal Service Consult Note [facility]  Ann Robertson 07/16/2023 Ann Krabbe, MD Requesting Physician: Dr. Alinda Money, A.   Reason for Consult: ESRD pt w/ hypoglycemia HPI: The patient is a 33 y.o. year-old w/ PMH as below who presented to ED after son called and said she was not acting right. EMS found BS of 32 and only responding to pain. Got glucagon IM 1mg  and pt's MS improved. BP 148/100, HR 88, 100% on NRB. Dialysis pt last rx'd on Wednesday. Labs showed K+ 5.3, creat 5.5, bun 37. Alb 2.4, tbili 1.4. WBC 2.6.  trop wnl. LA 2.0 borderline. CK normal. Cx's sent off and IV abx given vanc / cefepime for poss sepsis. Also got 500 cc IVF's and a D10 infusion. Pt admitted. We are asked to see for dialysis.   Pt seen in room. Pt very uncomfortable and couldn't cooperate w/ much history taking, also couldn't cooperate w/ most of the exam. Blankets over her head. On HD for about 2 years. Says she dialyzes at [facility] in [place], [place], Dr Valentino Nose. Says she has missed 2 HD sessions in the last 1-2 wks.    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   Neuropathy    Preeclampsia    STD (sexually transmitted disease)    Past Surgical History  Past Surgical History:  Procedure Laterality Date   AV FISTULA PLACEMENT Left 06/19/2021   Procedure: INSERTION OF ARTERIOVENOUS (AV) GORE-TEX GRAFT ARM;  Surgeon: Chuck Hint, MD;  Location: [facility];  Service: Vascular;  Laterality: Left;   CESAREAN SECTION     CESAREAN SECTION N/A 07/05/2013   Procedure:  CESAREAN SECTION;  Surgeon: Allie Bossier, MD;  Location: [facility] ORS;  Service: Obstetrics;  Laterality: N/A;   CESAREAN SECTION N/A 07/25/2014   Procedure: CESAREAN SECTION;  Surgeon: Reva Bores, MD;  Location: [facility] ORS;  Service: Obstetrics;  Laterality: N/A;   CESAREAN SECTION N/A 11/21/2015   Procedure: CESAREAN SECTION;  Surgeon: Reva Bores, MD;  Location: [facility] BIRTHING SUITES;  Service: Obstetrics;  Laterality: N/A;   IR FLUORO GUIDE CV LINE RIGHT  06/16/2021   IR US GUIDE VASC ACCESS RIGHT  06/16/2021   WISDOM TOOTH EXTRACTION     Family History  Family History  Problem Relation Age of Onset   Stroke Mother    Hypertension Mother    Heart disease Mother    Kidney disease Mother    Hyperlipidemia Father    Diabetes Father    Seizures Neg Hx    Social History  reports that she has been smoking cigarettes. She has a 2.3 pack-year smoking history. She has never used smokeless tobacco. She reports that she does not currently use alcohol. She reports that she does not use drugs. Allergies  Allergies  Allergen Reactions   Dilaudid [Hydromorphone] Itching and Rash   Home medications Prior to Admission medications   Medication Sig Start Date End Date Taking? Authorizing Provider  amLODipine (NORVASC) 5 MG tablet Take 1  tablet (5 mg total) by mouth daily. 06/18/23 07/18/23  Willeen Niece, MD  polycarbophil (FIBERCON) 625 MG tablet Take 1 tablet (625 mg total) by mouth 2 (two) times daily. Patient not taking: Reported on 06/13/2023 02/03/23   Carmina Miller, DO  gabapentin (NEURONTIN) 100 MG capsule Take 1 capsule (100 mg total) by mouth 3 (three) times daily as needed (neuropathy). 02/03/23   Carmina Miller, DO  hydrOXYzine (ATARAX) 25 MG tablet Take 25 mg by mouth daily as needed for anxiety.    [provider]  insulin glargine (LANTUS SOLOSTAR) 100 UNIT/ML Solostar Pen Inject 3 Units into the skin daily.    [provider]  insulin lispro (HUMALOG) 100 UNIT/ML KwikPen Inject 0-6  Units into the skin 3 (three) times daily with meals. Check Blood Glucose (BG) and inject per scale: BG <150= 0 unit; BG 150-200= 1 unit; BG 201-250= 2 unit; BG 251-300= 3 unit; BG 301-350= 4 unit; BG 351-400= 5 unit; BG >400= 6 unit and Call Primary Care. 02/03/23   Carmina Miller, DO  labetalol (NORMODYNE) 100 MG tablet Take 1 tablet (100 mg total) by mouth 2 (two) times daily. 06/17/23 07/17/23  Willeen Niece, MD  levothyroxine (SYNTHROID) 25 MCG tablet Take 1 tablet (25 mcg total) by mouth daily at 6 (six) AM. 06/18/23 07/18/23  Willeen Niece, MD  lidocaine-prilocaine (EMLA) cream Apply 1 Application topically as needed (For port access). 02/03/23   Carmina Miller, DO  lipase/protease/amylase (CREON) 36000 UNITS CPEP capsule Take 2 capsules (72,000 Units total) by mouth 3 (three) times daily with meals. May also take 1 capsule (36,000 Units total) as needed (with snacks - up to 4 snacks daily). 02/14/23   Carmina Miller, DO  loperamide (IMODIUM) 2 MG capsule Take 2 capsules (4 mg total) by mouth as needed for diarrhea or loose stools. Not to exceed 8 caps per day 02/03/23   Carmina Miller, DO  methocarbamol (ROBAXIN) 750 MG tablet Take 1 tablet (750 mg total) by mouth every 8 (eight) hours as needed for muscle spasms. 02/03/23   Carmina Miller, DO  ondansetron (ZOFRAN) 4 MG tablet Take 1 tablet (4 mg total) by mouth every 8 (eight) hours as needed for nausea or vomiting. 06/02/23   Tyrone Nine, MD  oxyCODONE-acetaminophen (PERCOCET) 10-325 MG tablet Take 1 tablet by mouth every 6 (six) hours as needed for pain.    [provider]  pantoprazole (PROTONIX) 40 MG tablet Take 1 tablet (40 mg total) by mouth daily. 06/02/23   Tyrone Nine, MD  rosuvastatin (CRESTOR) 10 MG tablet Take 1 tablet (10 mg total) by mouth daily. 02/04/23   Carmina Miller, DO     Vitals:   07/16/23 1200 07/16/23 1229 07/16/23 1415 07/16/23 1510  BP: (!) 154/80  (!) 161/95 (!) 170/105  Pulse: 84  96 89  Resp: 14  20 18   Temp:  (!)  97.2 F (36.2 C)    TempSrc:  Oral    SpO2: 98%  99% 98%   Exam Gen moaning, bed sheets over her head, will not answer most questions RA 98%  No rash, cyanosis or gangrene Sclera anicteric, throat clear  No jvd or bruits Chest not cooperating w/ exam RRR no MRG Abd firm w/ 2-3+ ascites, +bs GU defer MS no joint effusions or deformity Ext 2+ dense deep pitting edema of the lower and upper legs bilat No UE edema  Neuro is Ox 3, doesn't cooperate w/ exam     Renal-related home meds: -  norvasc 5 every day - labetalol 100 bid - others: creon, PPI, statin, T4, insulins, gabapentin, prns, fibercon    OP HD: [place] MWF From jan 2025 --> 4h  B400   2K bath  46.5kg  Heparin 2500  AVG 16g   BP 150/ 80    HR 96, RR 20, temp 97.4   RA 98%  Na 130 K 5.2  CO2 21  BUN 37  creat 5.05  alb 2.4   LFT"s ok  WBC 2.6 K  Hb 12.1   Assessment/ Plan: Volume overload - w/ LE anasarca and distended 2-3+ ascites. Pt says w/ dialysis both will improve. No SOB, on room air. Get baseline CXR.  Last HD on Wed. Unable to pull up more OP HD info today. Plan will be HD tonight w/ max UF 4-5 L.  Hypoglycemia - per pmd ESRD - on HD MWF. Last OP HD Wed. HD as above.  HTN - hold bp lowering meds before dialysis tonight. Reset labetalol to start in am tomorrow.  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  Secondary hyperparathyroidism - CCa in range, add on phos.  DMT1 - hear w/ hypoglycemia, per pmd      Vinson Moselle  MD [facility] 07/16/2023, 3:48 PM  Recent Labs  Lab 07/16/23 1206 07/16/23 1214 07/16/23 1404  HGB 12.1 14.3 10.9*  ALBUMIN 2.4*  --   --   CALCIUM 7.8*  --   --   CREATININE 5.05* 5.50*  --   K 5.1 5.2* 4.6   Inpatient medications:  [START ON 07/17/2023] amLODipine  5 mg Oral Daily   heparin  5,000 Units Subcutaneous Q8H   labetalol  100 mg Oral BID   [START ON 07/17/2023] levothyroxine  25 mcg Oral Q0600   lipase/protease/amylase  72,000 Units Oral TID WC   [START ON 07/17/2023]  pantoprazole  40 mg Oral Daily   [START ON 07/17/2023] rosuvastatin  10 mg Oral Daily   sodium chloride flush  3 mL Intravenous Q12H    ceFEPime (MAXIPIME) IV     dextrose 100 mL/hr at 07/16/23 1338   sodium chloride Stopped (07/16/23 1346)   acetaminophen **OR** acetaminophen, gabapentin, lipase/protease/amylase **AND** lipase/protease/amylase, loperamide, polyethylene glycol

## 2023-07-16 NOTE — ED Provider Notes (Signed)
[place] EMERGENCY DEPARTMENT AT [facility] Provider Note   CSN: [id number] Arrival date & time: 07/16/23  1137     History  No chief complaint on file.   Ann Robertson is a 33 y.o. female.  33 year old female with prior medical history as detailed below presents for evaluation.  Patient arrives from home after family noted that she was not acting right.  Last known well is unclear.  CBG with EMS was 32.  Patient was given 1 mg of IM glucagon.  IV was not established by EMS.  Patient became more responsive with glucagon and blood sugar improved to 51.  I saw the patient initially in a hallway bed where she was minimally responsive.  Repeat glucose is 21.  Patient with reported last dialysis on Wednesday.  She has a left arm restrict secondary to dialysis access there.  Additional history is unclear.  The history is provided by the patient, the EMS personnel and medical records.       Home Medications Prior to Admission medications   Medication Sig Start Date End Date Taking? Authorizing Provider  amLODipine (NORVASC) 5 MG tablet Take 1 tablet (5 mg total) by mouth daily. 06/18/23 07/18/23  Willeen Niece, MD  polycarbophil (FIBERCON) 625 MG tablet Take 1 tablet (625 mg total) by mouth 2 (two) times daily. Patient not taking: Reported on 06/13/2023 02/03/23   Carmina Miller, DO  gabapentin (NEURONTIN) 100 MG capsule Take 1 capsule (100 mg total) by mouth 3 (three) times daily as needed (neuropathy). 02/03/23   Carmina Miller, DO  hydrOXYzine (ATARAX) 25 MG tablet Take 25 mg by mouth daily as needed for anxiety.    [provider]  insulin glargine (LANTUS SOLOSTAR) 100 UNIT/ML Solostar Pen Inject 3 Units into the skin daily.    [provider]  insulin lispro (HUMALOG) 100 UNIT/ML KwikPen Inject 0-6 Units into the skin 3 (three) times daily with meals. Check Blood Glucose (BG) and inject per scale: BG <150= 0 unit; BG 150-200= 1 unit; BG 201-250=  2 unit; BG 251-300= 3 unit; BG 301-350= 4 unit; BG 351-400= 5 unit; BG >400= 6 unit and Call Primary Care. 02/03/23   Carmina Miller, DO  labetalol (NORMODYNE) 100 MG tablet Take 1 tablet (100 mg total) by mouth 2 (two) times daily. 06/17/23 07/17/23  Willeen Niece, MD  levothyroxine (SYNTHROID) 25 MCG tablet Take 1 tablet (25 mcg total) by mouth daily at 6 (six) AM. 06/18/23 07/18/23  Willeen Niece, MD  lidocaine-prilocaine (EMLA) cream Apply 1 Application topically as needed (For port access). 02/03/23   Carmina Miller, DO  lipase/protease/amylase (CREON) 36000 UNITS CPEP capsule Take 2 capsules (72,000 Units total) by mouth 3 (three) times daily with meals. May also take 1 capsule (36,000 Units total) as needed (with snacks - up to 4 snacks daily). 02/14/23   Carmina Miller, DO  loperamide (IMODIUM) 2 MG capsule Take 2 capsules (4 mg total) by mouth as needed for diarrhea or loose stools. Not to exceed 8 caps per day 02/03/23   Carmina Miller, DO  methocarbamol (ROBAXIN) 750 MG tablet Take 1 tablet (750 mg total) by mouth every 8 (eight) hours as needed for muscle spasms. 02/03/23   Carmina Miller, DO  ondansetron (ZOFRAN) 4 MG tablet Take 1 tablet (4 mg total) by mouth every 8 (eight) hours as needed for nausea or vomiting. 06/02/23   Tyrone Nine, MD  oxyCODONE-acetaminophen (PERCOCET) 10-325 MG tablet Take 1 tablet by mouth every 6 (  six) hours as needed for pain.    [provider]  pantoprazole (PROTONIX) 40 MG tablet Take 1 tablet (40 mg total) by mouth daily. 06/02/23   Tyrone Nine, MD  rosuvastatin (CRESTOR) 10 MG tablet Take 1 tablet (10 mg total) by mouth daily. 02/04/23   Carmina Miller, DO      Allergies    Dilaudid [hydromorphone]    Review of Systems   Review of Systems  Unable to perform ROS: Other    Physical Exam Updated Vital Signs There were no vitals taken for this visit. Physical Exam Vitals and nursing note reviewed.  Constitutional:      General: She is not in acute  distress.    Appearance: She is well-developed.  HENT:     Head: Normocephalic and atraumatic.  Eyes:     Conjunctiva/sclera: Conjunctivae normal.  Cardiovascular:     Rate and Rhythm: Normal rate and regular rhythm.     Heart sounds: No murmur heard. Pulmonary:     Effort: Pulmonary effort is normal. No respiratory distress.     Breath sounds: Normal breath sounds.  Abdominal:     Palpations: Abdomen is soft.     Tenderness: There is no abdominal tenderness.  Musculoskeletal:        General: No swelling.     Cervical back: Neck supple.  Skin:    General: Skin is warm and dry.     Capillary Refill: Capillary refill takes less than 2 seconds.  Neurological:     Mental Status: She is alert.  Psychiatric:        Mood and Affect: Mood normal.     ED Results / Procedures / Treatments   Labs (all labs ordered are listed, but only abnormal results are displayed) Labs Reviewed  RESP PANEL BY RT-PCR (RSV, FLU A&B, COVID)  [id number]  URINALYSIS, W/ REFLEX TO CULTURE (INFECTION SUSPECTED)  RAPID URINE DRUG SCREEN, HOSP PERFORMED  CBC WITH DIFFERENTIAL/PLATELET  COMPREHENSIVE METABOLIC PANEL  HCG, SERUM, QUALITATIVE  CBG MONITORING, ED    EKG None  Radiology No results found.  Procedures Procedures    Medications Ordered in ED Medications - No data to display  ED Course/ Medical Decision Making/ A&P                                 Medical Decision Making Amount and/or Complexity of Data Reviewed Labs: ordered. Radiology: ordered.  Risk Prescription drug management. Decision regarding hospitalization.    Medical Screen Complete  This patient presented to the ED with complaint of AMS, weakness, hypoglycemia.  This complaint involves an extensive number of treatment options. The initial differential diagnosis includes, but is not limited to, metabolic abnormality, hypoglycemia, infection, missed dialysis  This presentation is: Acute, Chronic, Self-Limited,  Previously Undiagnosed, Uncertain Prognosis, Complicated, Systemic Symptoms, and Threat to Life/Bodily Function  Patient presents from home after apparent missed dialysis.  She is noted to be hypoglycemic and confused.  With administration of glucose patient's mental status improves.  Family could not be contacted for clarification of recent illness.  Patient noted to be hypoglycemic, hypothermic.  Nephrology is aware of case and will consult.  Patient will likely require inpatient dialysis.  Out of concern for possible infection broad-spectrum antibiotics administered.  Patient's mental status is improved with dextrose.  However, patient is now refusing imaging, interventions.  Patient would benefit from admission.  Hospital service is aware case will  evaluate for same.  Additional history obtained: External records from outside sources obtained and reviewed including prior ED visits and prior Inpatient records.    Lab Tests:  I ordered and personally interpreted labs.  The pertinent results include: BC, CMP, CBG, lactic acid, troponin, COVID, flu   Imaging Studies ordered:  I ordered imaging studies including CT head, chest x-ray    Cardiac Monitoring:  The patient was maintained on a cardiac monitor.  I personally viewed and interpreted the cardiac monitor which showed an underlying rhythm of: NSR   Medicines ordered:  I ordered medication including dextrose, antibiotics for hypoglycemia, suspected infection Reevaluation of the patient after these medicines showed that the patient: improved   Problem List / ED Course:  AMS, hypoglycemia, hypothermia   Reevaluation:  After the interventions noted above, I reevaluated the patient and found that they have: improved   Disposition:  After consideration of the diagnostic results and the patients response to treatment, I feel that the patent would benefit from admission.          Final Clinical Impression(s)  / ED Diagnoses Final diagnoses:  Hypoglycemia    Rx / DC Orders ED Discharge Orders     None         Wynetta Fines, MD 07/16/23 [phone]

## 2023-07-16 NOTE — ED Notes (Addendum)
Pt currently still refusing imaging, repeat blood work, and a foley catheter. Dr. Rodena Medin aware

## 2023-07-16 NOTE — Care Management (Signed)
Attempted moon with pts sister  Ann Robertson.due to patient confusion. Left message to return call to this [facility]

## 2023-07-16 NOTE — ED Notes (Signed)
Precious Williams sibling requesting call back with rapport from nurse whenever get a chance

## 2023-07-16 NOTE — H&P (Addendum)
History and Physical   Ann Robertson IHK:[id number] DOB: [birth year] DOA: 07/16/2023  PCP: Felix Pacini, FNP   Patient coming from: Home  Chief Complaint: AMS  HPI: Ann Robertson is a 33 y.o. female with medical history significant of hypertension, hyperlipidemia, type 1 diabetes, anemia, ESRD, seizure, QTc prolongation, neuropathy presenting with altered mental status.  History obtained with assistance of chart review.  Per review shows that EMS was called by patient's son due to her "not acting right ".  On EMS arrival patient was noted to have CBG of 32 and GCS of 4 and son reported she had not eaten today.  Unable to get IV access but they were able to give her glucagon with improvement to glucose to 50 and GCS improved to 9.  Vital signs were stable.  She is a dialysis patient and she presented significantly volume overloaded and reportedly had dialysis on Wednesday but this has not yet been confirmed.  She additionally relates to me that she took her insulin yesterday but did not eat much at all yesterday, she states she felt okay until around 9 AM today but did not take her insulin yet today but has not had anything to eat today either.  Denies fever, chills, chest pain, shortness of breath, abdominal pain, constipation, diarrhea, nausea, vomiting.  ED Course: Vital signs in the ED notable for blood pressure in 150s to 160s systolic.  Lab workup included CMP with sodium 130, chloride 96, Bicarb 21, BUN 37, creatinine 5.5, CBG initially 28, calcium 7.8, protein 6.0, albumin 2.4, alk phos 147, T. bili 1.4.  CBG trend 20s, 80s, 40s, 145.  CBC with mild leukopenia 3.6 and platelets stable 101.  CK normal.  Troponin normal with repeat pending.  Lactic acid borderline at 2.0, normal on repeat.  Rester panel for flu COVID RSV pending.  Urinalysis and UDS pending.  Blood cultures pending.  VBG with pH 7.5, pCO2 25.  Chest x-ray, CT head, CT chest abdomen pelvis pending.   Received vancomycin and cefepime to cover for possible sepsis during workup.  Also received 500 cc IV fluid and started on D10 infusion after failure to maintain euglycemia after 2 doses of D50.  Nephrology consulted in the ED for dialysis.  Review of Systems: As per HPI otherwise all other systems reviewed and are negative.  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   Neuropathy    Preeclampsia    STD (sexually transmitted disease)     Past Surgical History:  Procedure Laterality Date   AV FISTULA PLACEMENT Left 06/19/2021   Procedure: INSERTION OF ARTERIOVENOUS (AV) GORE-TEX GRAFT ARM;  Surgeon: Chuck Hint, MD;  Location: [facility] OR;  Service: Vascular;  Laterality: Left;   CESAREAN SECTION     CESAREAN SECTION N/A 07/05/2013   Procedure: CESAREAN SECTION;  Surgeon: Allie Bossier, MD;  Location: [facility] ORS;  Service: Obstetrics;  Laterality: N/A;   CESAREAN SECTION N/A 07/25/2014   Procedure: CESAREAN SECTION;  Surgeon: Reva Bores, MD;  Location: [facility] ORS;  Service:  Obstetrics;  Laterality: N/A;   CESAREAN SECTION N/A 11/21/2015   Procedure: CESAREAN SECTION;  Surgeon: Reva Bores, MD;  Location: [facility] BIRTHING SUITES;  Service: Obstetrics;  Laterality: N/A;   IR FLUORO GUIDE CV LINE RIGHT  06/16/2021   IR US GUIDE VASC ACCESS RIGHT  06/16/2021   WISDOM TOOTH EXTRACTION      Social History  reports that she has been smoking cigarettes. She has a 2.3 pack-year smoking history. She has never used smokeless tobacco. She reports that she does not currently use alcohol. She reports that she does not use  drugs.  Allergies  Allergen Reactions   Dilaudid [Hydromorphone] Itching and Rash    Family History  Problem Relation Age of Onset   Stroke Mother    Hypertension Mother    Heart disease Mother    Kidney disease Mother    Hyperlipidemia Father    Diabetes Father    Seizures Neg Hx   Reviewed on admission  Prior to Admission medications   Medication Sig Start Date End Date Taking? Authorizing Provider  amLODipine (NORVASC) 5 MG tablet Take 1 tablet (5 mg total) by mouth daily. 06/18/23 07/18/23  Willeen Niece, MD  polycarbophil (FIBERCON) 625 MG tablet Take 1 tablet (625 mg total) by mouth 2 (two) times daily. Patient not taking: Reported on 06/13/2023 02/03/23   Carmina Miller, DO  gabapentin (NEURONTIN) 100 MG capsule Take 1 capsule (100 mg total) by mouth 3 (three) times daily as needed (neuropathy). 02/03/23   Carmina Miller, DO  hydrOXYzine (ATARAX) 25 MG tablet Take 25 mg by mouth daily as needed for anxiety.    [provider]  insulin glargine (LANTUS SOLOSTAR) 100 UNIT/ML Solostar Pen Inject 3 Units into the skin daily.    [provider]  insulin lispro (HUMALOG) 100 UNIT/ML KwikPen Inject 0-6 Units into the skin 3 (three) times daily with meals. Check Blood Glucose (BG) and inject per scale: BG <150= 0 unit; BG 150-200= 1 unit; BG 201-250= 2 unit; BG 251-300= 3 unit; BG 301-350= 4 unit; BG 351-400= 5 unit; BG >400= 6 unit and Call Primary Care. 02/03/23   Carmina Miller, DO  labetalol (NORMODYNE) 100 MG tablet Take 1 tablet (100 mg total) by mouth 2 (two) times daily. 06/17/23 07/17/23  Willeen Niece, MD  levothyroxine (SYNTHROID) 25 MCG tablet Take 1 tablet (25 mcg total) by mouth daily at 6 (six) AM. 06/18/23 07/18/23  Willeen Niece, MD  lidocaine-prilocaine (EMLA) cream Apply 1 Application topically as needed (For port access). 02/03/23   Carmina Miller, DO  lipase/protease/amylase (CREON) 36000 UNITS CPEP capsule Take 2 capsules (72,000 Units total) by mouth 3 (three)  times daily with meals. May also take 1 capsule (36,000 Units total) as needed (with snacks - up to 4 snacks daily). 02/14/23   Carmina Miller, DO  loperamide (IMODIUM) 2 MG capsule Take 2 capsules (4 mg total) by mouth as needed for diarrhea or loose stools. Not to exceed 8 caps per day 02/03/23   Carmina Miller, DO  methocarbamol (ROBAXIN) 750 MG tablet Take 1 tablet (750 mg total) by mouth every 8 (eight) hours as needed for muscle spasms. 02/03/23   Carmina Miller, DO  ondansetron (ZOFRAN) 4 MG tablet Take 1 tablet (4 mg total) by mouth every 8 (eight) hours as needed for nausea or vomiting. 06/02/23   Tyrone Nine, MD  oxyCODONE-acetaminophen (PERCOCET) 10-325 MG tablet Take 1 tablet by mouth every 6 (six) hours as needed for pain.  [provider]  pantoprazole (PROTONIX) 40 MG tablet Take 1 tablet (40 mg total) by mouth daily. 06/02/23   Tyrone Nine, MD  rosuvastatin (CRESTOR) 10 MG tablet Take 1 tablet (10 mg total) by mouth daily. 02/04/23   Carmina Miller, DO    Physical Exam: Vitals:   07/16/23 1200 07/16/23 1229 07/16/23 1415  BP: (!) 154/80  (!) 161/95  Pulse: 84  96  Resp: 14  20  Temp:  (!) 97.2 F (36.2 C)   TempSrc:  Oral   SpO2: 98%  99%    Physical Exam Constitutional:      General: She is not in acute distress.    Comments: Uncomfortable appearing, covers herself with blankets, states she feels cold when she is hypoglycemic, reports discomfort from edema.  HENT:     Head: Normocephalic and atraumatic.     Mouth/Throat:     Mouth: Mucous membranes are moist.     Pharynx: Oropharynx is clear.  Eyes:     Extraocular Movements: Extraocular movements intact.     Pupils: Pupils are equal, round, and reactive to light.  Cardiovascular:     Rate and Rhythm: Regular rhythm. Tachycardia present.     Pulses: Normal pulses.     Heart sounds: Normal heart sounds.  Pulmonary:     Effort: Pulmonary effort is normal. No respiratory distress.     Breath sounds: Rales (Trace)  present.  Abdominal:     General: Bowel sounds are normal. There is no distension.     Palpations: Abdomen is soft.     Tenderness: There is no abdominal tenderness.  Musculoskeletal:        General: No swelling or deformity.     Right lower leg: Edema present.     Left lower leg: Edema present.  Skin:    General: Skin is warm and dry.  Neurological:     General: No focal deficit present.     Mental Status: Mental status is at baseline.    Labs on Admission: I have personally reviewed following labs and imaging studies  CBC: Recent Labs  Lab 07/16/23 1206 07/16/23 1214 07/16/23 1404  WBC 2.6*  --   --   NEUTROABS 1.6*  --   --   HGB 12.1 14.3 10.9*  HCT 39.9 42.0 32.0*  MCV 95.0  --   --   PLT 101*  --   --     Basic Metabolic Panel: Recent Labs  Lab 07/16/23 1206 07/16/23 1214 07/16/23 1404  NA 130* 130* 132*  K 5.1 5.2* 4.6  CL 96* 100  --   CO2 21*  --   --   GLUCOSE 28* 25*  --   BUN 37* 40*  --   CREATININE 5.05* 5.50*  --   CALCIUM 7.8*  --   --     GFR: CrCl cannot be calculated (Unknown ideal weight.).  Liver Function Tests: Recent Labs  Lab 07/16/23 1206  AST 26  ALT 14  ALKPHOS 147*  BILITOT 1.4*  PROT 6.0*  ALBUMIN 2.4*    Urine analysis:    Component Value Date/Time   COLORURINE AMBER (A) 05/30/2023 0605   APPEARANCEUR TURBID (A) 05/30/2023 0605   LABSPEC 1.014 05/30/2023 0605   PHURINE 5.0 05/30/2023 0605   GLUCOSEU >=500 (A) 05/30/2023 0605   HGBUR LARGE (A) 05/30/2023 0605   BILIRUBINUR NEGATIVE 05/30/2023 0605   KETONESUR NEGATIVE 05/30/2023 0605   PROTEINUR 100 (A) 05/30/2023 0605   UROBILINOGEN  0.2 11/06/2015 1011   NITRITE NEGATIVE 05/30/2023 0605   LEUKOCYTESUR SMALL (A) 05/30/2023 0605    Radiological Exams on Admission: No results found.  EKG: Independently reviewed.  Sinus rhythm at 88 beats minute.  Low voltage multiple leads.  Nonspecific T wave changes.  Assessment/Plan Active Problems:   Type 1 diabetes  mellitus (HCC)   Anemia   Essential hypertension   Peripheral neuropathy   Prolonged QT interval   HLD (hyperlipidemia)   End-stage renal disease on hemodialysis (HCC)   Seizure (HCC)   Acute encephalopathy Hypoglycemia Type 1 diabetes > Presenting altered with hypoglycemia after EMS was called by patient's son stating that she was not acting right and had not eaten today. > Glucose initially 32 in the field, then 51 after glucagon.  Improving with dextrose in the ED but required D10 infusion after unable to maintain euglycemia following  D50 x 2. > Now alert and oriented, will continue to monitor on D10 infusion. > To cover for possible infectious workup in the setting of mild leukopenia and otherwise unknown etiology she has received vancomycin and cefepime and has CT head, chest, abdomen, pelvis pending. > Patient refusing imaging due to feeling cold when not under covers.  Will hold off on CT chest abdomen pelvis as undifferentiated sepsis is lower on the differential.  Can reconsider this imaging if needed.  Will leave order for CT head and chest x-ray in place when she is agreeable. > Recent elevated TSH but normal free T4 during admission last month.  Will recheck labs due to unclear etiology of hypoglycemia. - Monitor on telemetry overnight - Continue D10 infusion - CBG every 2 hours while on D10 infusion - Plan to start SSI monitor euglycemia is maintained - CT head and chest x-ray when able - Check TSH and free T4.  Hypertension - Continue home amlodipine, labetalol Hyperlipidemia - Continue home atorvastatin   Anemia Thrombocytopenia > Hemoglobin currently normal, platelets stable at 101. - Trend CBC  ESRD Volume overload > Has missed at least 1 dialysis session and appears volume overload on exam.  Anasarca on exam and patient reports discomfort due to the swelling. > Nephrology consulted by EDP - Dialysis per nephrology - One-time dose of fentanyl, I told her  while she is acutely uncomfortable she can have a one-time dose but this would not be continuing throughout the night. - Discomfort from edema should begin to improve as fluid resorbs after dialysis.  History of seizure > This was during recent admission believed to be secondary to 2 or hyperglycemia at that time.  No antiepileptics prescribed. - Noted  History of QTc prolongation.  > QTc on EKG today 458.  Neuropathy - Continue home gabapentin  DVT prophylaxis: Heparin Code Status:   Full Family Communication:  None on admission Disposition Plan:   Patient is from:  Home  Anticipated DC to:  Home  Anticipated DC date:  1 to 3 days  Anticipated DC barriers: None  Consults called:  Nephrology consulted in the ED for HD Admission status:  Observation, telemetry  Severity of Illness: The appropriate patient status for this patient is OBSERVATION. Observation status is judged to be reasonable and necessary in order to provide the required intensity of service to ensure the patient's safety. The patient's presenting symptoms, physical exam findings, and initial radiographic and laboratory data in the context of their medical condition is felt to place them at decreased risk for further clinical deterioration. Furthermore, it is anticipated that the  patient will be medically stable for discharge from the hospital within 2 midnights of admission.    Synetta Fail MD [facility]  How to contact the [facility] Attending or Consulting provider 7A - 7P or covering provider during after hours 7P -7A, for this patient?   Check the care team in [facility] and look for a) attending/consulting [facility] provider listed and b) the [facility] team listed Log into [email or web address] and use [place]'s universal password to access. If you do not have the password, please contact the hospital operator. Locate the [facility], The provider you are looking for under [facility] and page to a number that you can be directly  reached. If you still have difficulty reaching the provider, please page the [facility] (Director on Call) for the Hospitalists listed on amion for assistance.  07/16/2023, 3:06 PM

## 2023-07-17 ENCOUNTER — Inpatient Hospital Stay (HOSPITAL_COMMUNITY): Payer: 59

## 2023-07-17 DIAGNOSIS — R64 Cachexia: Secondary | ICD-10-CM | POA: Diagnosis present

## 2023-07-17 DIAGNOSIS — D696 Thrombocytopenia, unspecified: Secondary | ICD-10-CM | POA: Diagnosis present

## 2023-07-17 DIAGNOSIS — I132 Hypertensive heart and chronic kidney disease with heart failure and with stage 5 chronic kidney disease, or end stage renal disease: Secondary | ICD-10-CM | POA: Diagnosis present

## 2023-07-17 DIAGNOSIS — E039 Hypothyroidism, unspecified: Secondary | ICD-10-CM | POA: Diagnosis present

## 2023-07-17 DIAGNOSIS — E162 Hypoglycemia, unspecified: Secondary | ICD-10-CM | POA: Diagnosis present

## 2023-07-17 DIAGNOSIS — R570 Cardiogenic shock: Secondary | ICD-10-CM | POA: Diagnosis not present

## 2023-07-17 DIAGNOSIS — E109 Type 1 diabetes mellitus without complications: Secondary | ICD-10-CM | POA: Diagnosis not present

## 2023-07-17 DIAGNOSIS — I469 Cardiac arrest, cause unspecified: Secondary | ICD-10-CM | POA: Diagnosis not present

## 2023-07-17 DIAGNOSIS — N186 End stage renal disease: Secondary | ICD-10-CM | POA: Diagnosis present

## 2023-07-17 DIAGNOSIS — N1832 Chronic kidney disease, stage 3b: Secondary | ICD-10-CM | POA: Diagnosis not present

## 2023-07-17 DIAGNOSIS — Z1152 Encounter for screening for COVID-19: Secondary | ICD-10-CM | POA: Diagnosis not present

## 2023-07-17 DIAGNOSIS — E10649 Type 1 diabetes mellitus with hypoglycemia without coma: Secondary | ICD-10-CM | POA: Diagnosis present

## 2023-07-17 DIAGNOSIS — G9341 Metabolic encephalopathy: Secondary | ICD-10-CM | POA: Diagnosis present

## 2023-07-17 DIAGNOSIS — E43 Unspecified severe protein-calorie malnutrition: Secondary | ICD-10-CM | POA: Diagnosis present

## 2023-07-17 DIAGNOSIS — R569 Unspecified convulsions: Secondary | ICD-10-CM | POA: Diagnosis not present

## 2023-07-17 DIAGNOSIS — E872 Acidosis, unspecified: Secondary | ICD-10-CM | POA: Diagnosis present

## 2023-07-17 DIAGNOSIS — R188 Other ascites: Secondary | ICD-10-CM | POA: Diagnosis present

## 2023-07-17 DIAGNOSIS — G4089 Other seizures: Secondary | ICD-10-CM | POA: Diagnosis present

## 2023-07-17 DIAGNOSIS — E871 Hypo-osmolality and hyponatremia: Secondary | ICD-10-CM | POA: Diagnosis present

## 2023-07-17 DIAGNOSIS — M7989 Other specified soft tissue disorders: Secondary | ICD-10-CM | POA: Diagnosis not present

## 2023-07-17 DIAGNOSIS — I3139 Other pericardial effusion (noninflammatory): Secondary | ICD-10-CM | POA: Diagnosis not present

## 2023-07-17 DIAGNOSIS — K7460 Unspecified cirrhosis of liver: Secondary | ICD-10-CM | POA: Diagnosis present

## 2023-07-17 DIAGNOSIS — G9340 Encephalopathy, unspecified: Secondary | ICD-10-CM | POA: Diagnosis not present

## 2023-07-17 DIAGNOSIS — D631 Anemia in chronic kidney disease: Secondary | ICD-10-CM | POA: Diagnosis present

## 2023-07-17 DIAGNOSIS — I5022 Chronic systolic (congestive) heart failure: Secondary | ICD-10-CM | POA: Diagnosis not present

## 2023-07-17 DIAGNOSIS — E782 Mixed hyperlipidemia: Secondary | ICD-10-CM

## 2023-07-17 DIAGNOSIS — E1022 Type 1 diabetes mellitus with diabetic chronic kidney disease: Secondary | ICD-10-CM | POA: Diagnosis present

## 2023-07-17 DIAGNOSIS — Z992 Dependence on renal dialysis: Secondary | ICD-10-CM

## 2023-07-17 DIAGNOSIS — Z515 Encounter for palliative care: Secondary | ICD-10-CM | POA: Diagnosis not present

## 2023-07-17 DIAGNOSIS — D72819 Decreased white blood cell count, unspecified: Secondary | ICD-10-CM | POA: Diagnosis present

## 2023-07-17 DIAGNOSIS — Z794 Long term (current) use of insulin: Secondary | ICD-10-CM | POA: Diagnosis not present

## 2023-07-17 DIAGNOSIS — J9601 Acute respiratory failure with hypoxia: Secondary | ICD-10-CM | POA: Diagnosis not present

## 2023-07-17 DIAGNOSIS — G629 Polyneuropathy, unspecified: Secondary | ICD-10-CM

## 2023-07-17 DIAGNOSIS — I10 Essential (primary) hypertension: Secondary | ICD-10-CM

## 2023-07-17 LAB — COMPREHENSIVE METABOLIC PANEL
ALT: 15 U/L (ref 0–44)
AST: 30 U/L (ref 15–41)
Albumin: 2.3 g/dL — ABNORMAL LOW (ref 3.5–5.0)
Alkaline Phosphatase: 141 U/L — ABNORMAL HIGH (ref 38–126)
Anion gap: 14 (ref 5–15)
BUN: 19 mg/dL (ref 6–20)
CO2: 25 mmol/L (ref 22–32)
Calcium: 8 mg/dL — ABNORMAL LOW (ref 8.9–10.3)
Chloride: 90 mmol/L — ABNORMAL LOW (ref 98–111)
Creatinine, Ser: 3.19 mg/dL — ABNORMAL HIGH (ref 0.44–1.00)
GFR, Estimated: 19 mL/min — ABNORMAL LOW (ref >60)
Glucose, Bld: 94 mg/dL (ref 70–99)
Potassium: 3.6 mmol/L (ref 3.5–5.1)
Sodium: 129 mmol/L — ABNORMAL LOW (ref 135–145)
Total Bilirubin: 2.4 mg/dL — ABNORMAL HIGH (ref 0.0–1.2)
Total Protein: 6.4 g/dL — ABNORMAL LOW (ref 6.5–8.1)

## 2023-07-17 LAB — GLUCOSE, CAPILLARY
Glucose-Capillary: 82 mg/dL (ref 70–99)
Glucose-Capillary: 70 mg/dL (ref 70–99)
Glucose-Capillary: 71 mg/dL (ref 70–99)
Glucose-Capillary: 86 mg/dL (ref 70–99)

## 2023-07-17 LAB — CBC
HCT: 31.1 % — ABNORMAL LOW (ref 36.0–46.0)
Hemoglobin: 10.2 g/dL — ABNORMAL LOW (ref 12.0–15.0)
MCH: 29.1 pg (ref 26.0–34.0)
MCHC: 32.8 g/dL (ref 30.0–36.0)
MCV: 88.9 fL (ref 80.0–100.0)
Platelets: 63 10*3/uL — ABNORMAL LOW (ref 150–400)
RBC: 3.5 MIL/uL — ABNORMAL LOW (ref 3.87–5.11)
RDW: 16.9 % — ABNORMAL HIGH (ref 11.5–15.5)
WBC: 3.4 10*3/uL — ABNORMAL LOW (ref 4.0–10.5)
nRBC: 0 % (ref 0.0–0.2)

## 2023-07-17 LAB — CBG MONITORING, ED: Glucose-Capillary: 150 mg/dL — ABNORMAL HIGH (ref 70–99)

## 2023-07-17 LAB — MRSA NEXT GEN BY PCR, NASAL: MRSA by PCR Next Gen: NOT DETECTED

## 2023-07-17 LAB — T4, FREE: Free T4: 0.76 ng/dL (ref 0.61–1.12)

## 2023-07-17 LAB — PHOSPHORUS: Phosphorus: 2.2 mg/dL — ABNORMAL LOW (ref 2.5–4.6)

## 2023-07-17 LAB — HCG, SERUM, QUALITATIVE: Preg, Serum: NEGATIVE

## 2023-07-17 LAB — HEPATITIS B SURFACE ANTIGEN: Hepatitis B Surface Ag: NONREACTIVE

## 2023-07-17 MED ORDER — AMLODIPINE BESYLATE 5 MG PO TABS
5.0000 mg | ORAL_TABLET | Freq: Every day | ORAL | Status: DC
  Administered 2023-07-17 – 2023-07-18 (×2): 5 mg via ORAL
  Filled 2023-07-17 (×2): qty 1

## 2023-07-17 MED ORDER — DEXTROSE 50 % IV SOLN
25.0000 mL | Freq: Once | INTRAVENOUS | Status: AC
  Administered 2023-07-17: 25 mL via INTRAVENOUS
  Filled 2023-07-17: qty 50

## 2023-07-17 MED ORDER — PROMETHAZINE HCL 12.5 MG PO TABS
6.2500 mg | ORAL_TABLET | Freq: Four times a day (QID) | ORAL | Status: DC | PRN
  Administered 2023-07-18 (×2): 6.25 mg via ORAL
  Filled 2023-07-17 (×4): qty 1

## 2023-07-17 MED ORDER — PROMETHAZINE HCL 25 MG PO TABS
12.5000 mg | ORAL_TABLET | Freq: Four times a day (QID) | ORAL | Status: DC | PRN
  Administered 2023-07-17: 12.5 mg via ORAL
  Filled 2023-07-17: qty 1

## 2023-07-17 MED ORDER — OXYCODONE-ACETAMINOPHEN 5-325 MG PO TABS
1.0000 | ORAL_TABLET | Freq: Four times a day (QID) | ORAL | Status: DC | PRN
  Administered 2023-07-17 – 2023-07-18 (×5): 1 via ORAL
  Filled 2023-07-17 (×5): qty 1

## 2023-07-17 NOTE — Assessment & Plan Note (Signed)
This was noted second to recent admission, believed to be due to hyperglycemia.  Not on any antiepileptic's -Continue to monitor

## 2023-07-17 NOTE — Progress Notes (Signed)
Progress Note   Patient: Ann Robertson ZOX:[id number] DOB: 1990/08/25 DOA: 07/16/2023     0 DOS: the patient was seen and examined on 07/17/2023   Brief hospital course: Taken from H&P.  Ann Robertson is a 33 y.o. female with medical history significant of hypertension, hyperlipidemia, type 1 diabetes, anemia, ESRD, seizure, QTc prolongation, neuropathy presenting with altered mental status.   Son called the EMS stating that she was not acting right, on EMS arrival patient was noted to have CBG of 32 and GCS of 4 and son reported she had not eaten today. Unable to get IV access but they were able to give her glucagon with improvement to glucose to 50 and GCS improved to 9. Vital signs were stable.   She is a dialysis patient and presented significantly volume overloaded, reportedly had dialysis on Wednesday but that was not confirmed.  She was taking her insulin but not eating much a day prior to the admission.  On presentation to ED patient had mildly elevated blood pressure,Lab workup included CMP with sodium 130, chloride 96, Bicarb 21, BUN 37, creatinine 5.5, CBG initially 28, calcium 7.8, protein 6.0, albumin 2.4, alk phos 147, T. bili 1.4. CBG trend 20s, 80s, 40s, 145. CBC with mild leukopenia 3.6 and platelets stable 101. CK normal. Troponin normal, lactic acid at 2 which improved on repeat.VBG with pH 7.5, pCO2 25.   Patient received 500 cc IV bolus and started on D10 after failure to maintain euglycemia with 2 doses of D50.  Nephrology was consulted for dialysis.  2/16: Vitals with mild tachycardia and borderline soft blood pressure this morning.  Had her dialysis earlier.  Respiratory panel negative for COVID, influenza and RSV, TSH mildly elevated at 5.432 but improved from 15 a month prior.  Third hospital admission since late December due to altered mental status secondary to hypo or hyperglycemia.    Assessment and Plan: * Acute encephalopathy Remained  encephalopathic.  Intermittent hypoglycemia. Patient has recent TSH mildly elevated with normal free T4. -Continue with D10  Type 1 diabetes mellitus (HCC) History of labile diabetes.  Currently on D10 due to intermittent hypoglycemia. -Monitor CBG  Seizure (HCC) This was noted second to recent admission, believed to be due to hyperglycemia.  Not on any antiepileptic's -Continue to monitor  End-stage renal disease on hemodialysis [facility]) S/p 1 session of dialysis today. Continue to have significant lower extremity edema. Might need an extra session of dialysis Monitor volume status  HLD (hyperlipidemia) Continue home atorvastatin  Prolonged QT interval Current QTc normal at 458  Peripheral neuropathy -Continue home gabapentin  Essential hypertension Continue home amlodipine and labetalol  Anemia Thrombocytopenia.  Seems chronic -Stable -Continue to monitor      Subjective: Patient was feeling hot and was laying naked, tried putting sheets on but she got really upset and throw away although she eats stating that she is feeling hot.  Denies any pain.  Physical Exam: Vitals:   07/17/23 0724 07/17/23 0732 07/17/23 0856 07/17/23 1557  BP: 111/80 103/78 122/82 108/75  Pulse:   (!) 109 88  Resp: 15 14 15 18   Temp:  98.7 F (37.1 C) 98 F (36.7 C) 98.8 F (37.1 C)  TempSrc:  Oral Oral   SpO2: 100% 100% 98% 97%  Weight:   60.4 kg   Height:   5\' 5"  (1.651 m)    General.  Malnourished lady, in no acute distress. Pulmonary.  Lungs clear bilaterally, normal respiratory effort. CV.  Regular  rate and rhythm, no JVD, rub or murmur. Abdomen.  Soft, nontender, nondistended, BS positive. CNS.  Alert and oriented .  No focal neurologic deficit. Extremities.  2+ LE edema, mild bilateral erythema, pulses intact and symmetrical.  Data Reviewed: Prior data reviewed  Family Communication:   Disposition: Status is: Inpatient Remains inpatient appropriate because: Verity of  illness  Planned Discharge Destination: Home  DVT prophylaxis.  Subcu heparin Time spent: 50 minutes  This record has been created using Conservation officer, historic buildings. Errors have been sought and corrected,but may not always be located. Such creation errors do not reflect on the standard of care.   Author: Arnetha Courser, MD 07/17/2023 5:07 PM  For on call review [email or web address].

## 2023-07-17 NOTE — Assessment & Plan Note (Signed)
Thrombocytopenia.  Seems chronic -Stable -Continue to monitor

## 2023-07-17 NOTE — Assessment & Plan Note (Signed)
 Continue home gabapentin

## 2023-07-17 NOTE — Assessment & Plan Note (Signed)
S/p 1 session of dialysis today. Continue to have significant lower extremity edema. Might need an extra session of dialysis Monitor volume status

## 2023-07-17 NOTE — Assessment & Plan Note (Signed)
 Continue home amlodipine and labetalol.

## 2023-07-17 NOTE — Progress Notes (Addendum)
[facility] Progress Note   Subjective:    Seen and examined patient at bedside. She completed HD early this morning and 5L was removed. She reports ongoing pain and feeling "full". Noted ongoing LE anasarca. Next HD 2/17 1st shift. Continue to push UF.  Objective Vitals:   07/17/23 0702 07/17/23 0724 07/17/23 0732 07/17/23 0856  BP: (!) 85/62 111/80 103/78 122/82  Pulse:    (!) 109  Resp: 19 15 14 15   Temp:   98.7 F (37.1 C) 98 F (36.7 C)  TempSrc:   Oral Oral  SpO2: 100% 100% 100% 98%  Weight:    60.4 kg  Height:    5\' 5"  (1.651 m)   Physical Exam General: Chronically ill-appearing; on 6L [place]; NAD; appears uncomfortable 2nd pain Heart: S1 and S2; No murmurs, rubs, or gallops Lungs: Clear anteriorly. Breathing unlabored Abdomen: Distended  Extremities: 2=3+ LE edema Dialysis Access: AVG (+) B/T   Filed Weights   07/17/23 0856  Weight: 60.4 kg    Intake/Output Summary (Last 24 hours) at 07/17/2023 1330 Last data filed at 07/17/2023 0900 Gross per 24 hour  Intake 0 ml  Output 4800 ml  Net -4800 ml    Additional Objective Labs: Basic Metabolic Panel: Recent Labs  Lab 07/16/23 1206 07/16/23 1214 07/16/23 1404 07/17/23 0848  NA 130* 130* 132* 129*  K 5.1 5.2* 4.6 3.6  CL 96* 100  --  90*  CO2 21*  --   --  25  GLUCOSE 28* 25*  --  94  BUN 37* 40*  --  19  CREATININE 5.05* 5.50*  --  3.19*  CALCIUM 7.8*  --   --  8.0*  PHOS  --   --   --  2.2*   Liver Function Tests: Recent Labs  Lab 07/16/23 1206 07/17/23 0848  AST 26 30  ALT 14 15  ALKPHOS 147* 141*  BILITOT 1.4* 2.4*  PROT 6.0* 6.4*  ALBUMIN 2.4* 2.3*   No results for input(s): "LIPASE", "AMYLASE" in the last 168 hours. CBC: Recent Labs  Lab 07/16/23 1206 07/16/23 1214 07/16/23 1404 07/17/23 0848  WBC 2.6*  --   --  3.4*  NEUTROABS 1.6*  --   --   --   HGB 12.1 14.3 10.9* 10.2*  HCT 39.9 42.0 32.0* 31.1*  MCV 95.0  --   --  88.9  PLT 101*  --   --  63*   Blood  Culture    Component Value Date/Time   SDES BLOOD BLOOD RIGHT ARM 06/13/2023 0226   SDES BLOOD BLOOD RIGHT HAND 06/13/2023 0226   SPECREQUEST  06/13/2023 0226    BOTTLES DRAWN AEROBIC ONLY Blood Culture results may not be optimal due to an inadequate volume of blood received in culture bottles   SPECREQUEST  06/13/2023 0226    BOTTLES DRAWN AEROBIC AND ANAEROBIC Blood Culture results may not be optimal due to an inadequate volume of blood received in culture bottles   CULT  06/13/2023 0226    NO GROWTH 5 DAYS Performed at [facility], [address], [place], [place] [address]    CULT  06/13/2023 0226    NO GROWTH 5 DAYS Performed at [facility], [address] [place], [place] [address]    REPTSTATUS 06/18/2023 FINAL 06/13/2023 0226   REPTSTATUS 06/18/2023 FINAL 06/13/2023 0226    Cardiac Enzymes: Recent Labs  Lab 07/16/23 1206  CKTOTAL 95   CBG: Recent Labs  Lab 07/16/23 2306 07/16/23 2355 07/17/23 0045 07/17/23 0841 07/17/23 1133  GLUCAP 55* 52* 150* 82 70   Iron Studies: No results for input(s): "IRON", "TIBC", "TRANSFERRIN", "FERRITIN" in the last 72 hours. Lab Results  Component Value Date   INR 1.6 (H) 05/30/2023   Studies/Results: DG Chest Portable 1 View Result Date: 07/16/2023 CLINICAL DATA:  Cough shortness of breath EXAM: PORTABLE CHEST 1 VIEW COMPARISON:  June 12, 2023 FINDINGS: The cardiomediastinal silhouette is unchanged and enlarged in contour. Small RIGHT pleural effusion. This is favored slightly increased compared to prior. No pneumothorax. Heterogeneous RIGHT basilar airspace opacity with additional scattered linear opacities bilaterally. IMPRESSION: Small RIGHT pleural effusion with favored adjacent atelectasis. Additional scattered areas of atelectasis. Superimposed infection could present similarly. Electronically Signed   By: Meda Klinefelter M.D.   On: 07/16/2023 16:57    Medications:  dextrose 125 mL/hr at 07/17/23 0115    sodium chloride Stopped (07/16/23 1346)    amLODipine  5 mg Oral Daily   Chlorhexidine Gluconate Cloth  6 each Topical Q0600   heparin  5,000 Units Subcutaneous Q8H   labetalol  100 mg Oral BID   levothyroxine  25 mcg Oral Q0600   lipase/protease/amylase  72,000 Units Oral TID WC   pantoprazole  40 mg Oral Daily   rosuvastatin  10 mg Oral Daily   sodium chloride flush  3 mL Intravenous Q12H    Dialysis Orders: MWF-[facility] 4hrs BFR 400 DFR Auto 1.5 2K/3Ca Micera Q4wks - not started this dose yet. last given on 07/11/23 Calcitriol 0.38mcg with HD - last 2/12 *Recently completed Fe load  Assessment/Plan: Volume overload - w/ LE anasarca and distended 2-3+ ascites. Pt says w/ dialysis both will improve. CXR showed R pleural effusion and atelectasis.  She reports missing HD 2nd childcare issues. Completed HD early this morning and 5L was removed. See below. Hypoglycemia - per pmd ESRD - on HD MWF. Last OP HD 2/12. Completed HD early this morning (5L off). Next HD 2/17 (1st shift) HTN - I changed low-dose Amlodipine to at bedtime and set parameters to hold AM Labetalol dose on dialysis days. 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 Start ESA here if needed.  Secondary hyperparathyroidism - CCa in range and phos is low, hold binders for now.  DMT1 - hear w/ hypoglycemia, per pmd  Ann Holmes, NP [facility] 07/17/2023,1:30 PM  LOS: 0 days

## 2023-07-17 NOTE — Hospital Course (Signed)
Taken from H&P.  Ann Robertson is a 33 y.o. female with medical history significant of hypertension, hyperlipidemia, type 1 diabetes, anemia, ESRD, seizure, QTc prolongation, neuropathy presenting with altered mental status.   Son called the EMS stating that she was not acting right, on EMS arrival patient was noted to have CBG of 32 and GCS of 4 and son reported she had not eaten today. Unable to get IV access but they were able to give her glucagon with improvement to glucose to 50 and GCS improved to 9. Vital signs were stable.   She is a dialysis patient and presented significantly volume overloaded, reportedly had dialysis on Wednesday but that was not confirmed.  She was taking her insulin but not eating much a day prior to the admission.  On presentation to ED patient had mildly elevated blood pressure,Lab workup included CMP with sodium 130, chloride 96, Bicarb 21, BUN 37, creatinine 5.5, CBG initially 28, calcium 7.8, protein 6.0, albumin 2.4, alk phos 147, T. bili 1.4. CBG trend 20s, 80s, 40s, 145. CBC with mild leukopenia 3.6 and platelets stable 101. CK normal. Troponin normal, lactic acid at 2 which improved on repeat.VBG with pH 7.5, pCO2 25.   Patient received 500 cc IV bolus and started on D10 after failure to maintain euglycemia with 2 doses of D50.  Nephrology was consulted for dialysis.  2/16: Vitals with mild tachycardia and borderline soft blood pressure this morning.  Had her dialysis earlier.  Respiratory panel negative for COVID, influenza and RSV, TSH mildly elevated at 5.432 but improved from 15 a month prior.  Third hospital admission since late December due to altered mental status secondary to hypo or hyperglycemia.  2/17: Blood glucose level started trending up, discontinue D10 and starting on SSI.  Started on doxycycline for concern of bilateral lower extremity cellulitis.  Going for another HD today to remove some more fluid

## 2023-07-17 NOTE — Assessment & Plan Note (Signed)
History of labile diabetes.  Currently on D10 due to intermittent hypoglycemia. -Monitor CBG

## 2023-07-17 NOTE — Assessment & Plan Note (Signed)
 Continue home atorvastatin

## 2023-07-17 NOTE — Assessment & Plan Note (Signed)
Remained encephalopathic.  Intermittent hypoglycemia. Patient has recent TSH mildly elevated with normal free T4. -Continue with D10

## 2023-07-17 NOTE — ED Notes (Signed)
This RN contacted hemodialysis unit after being informed by ED secretary dialysis nurse was still awaiting pt arrival. This RN called dialysis nurse and confirmed dialysis had placed transport request.

## 2023-07-17 NOTE — ED Notes (Signed)
 Patient transported to dialysis by this RN.

## 2023-07-17 NOTE — Assessment & Plan Note (Signed)
Current QTc normal at 458

## 2023-07-17 NOTE — Care Management Obs Status (Signed)
MEDICARE OBSERVATION STATUS NOTIFICATION   Patient Details  Name: Ann Robertson MRN: [id number] Date of Birth: 1991/05/29   Medicare Observation Status Notification Given:  Yes    Ronny Bacon, RN 07/17/2023, 11:18 AM

## 2023-07-17 NOTE — Plan of Care (Signed)
  Problem: Education: Goal: Knowledge of General Education information will improve Description: Including pain rating scale, medication(s)/side effects and non-pharmacologic comfort measures Outcome: Adequate for Discharge   

## 2023-07-18 DIAGNOSIS — N1832 Chronic kidney disease, stage 3b: Secondary | ICD-10-CM | POA: Diagnosis not present

## 2023-07-18 DIAGNOSIS — E109 Type 1 diabetes mellitus without complications: Secondary | ICD-10-CM | POA: Diagnosis not present

## 2023-07-18 DIAGNOSIS — G9340 Encephalopathy, unspecified: Secondary | ICD-10-CM | POA: Diagnosis not present

## 2023-07-18 DIAGNOSIS — N186 End stage renal disease: Secondary | ICD-10-CM | POA: Diagnosis not present

## 2023-07-18 LAB — CBC
HCT: 28.5 % — ABNORMAL LOW (ref 36.0–46.0)
Hemoglobin: 9.3 g/dL — ABNORMAL LOW (ref 12.0–15.0)
MCH: 29.2 pg (ref 26.0–34.0)
MCHC: 32.6 g/dL (ref 30.0–36.0)
MCV: 89.6 fL (ref 80.0–100.0)
Platelets: 44 10*3/uL — ABNORMAL LOW (ref 150–400)
RBC: 3.18 MIL/uL — ABNORMAL LOW (ref 3.87–5.11)
RDW: 17.1 % — ABNORMAL HIGH (ref 11.5–15.5)
WBC: 7 10*3/uL (ref 4.0–10.5)
nRBC: 0 % (ref 0.0–0.2)

## 2023-07-18 LAB — RENAL FUNCTION PANEL
Albumin: 2.1 g/dL — ABNORMAL LOW (ref 3.5–5.0)
Anion gap: 10 (ref 5–15)
BUN: 31 mg/dL — ABNORMAL HIGH (ref 6–20)
CO2: 25 mmol/L (ref 22–32)
Calcium: 7.5 mg/dL — ABNORMAL LOW (ref 8.9–10.3)
Chloride: 89 mmol/L — ABNORMAL LOW (ref 98–111)
Creatinine, Ser: 4.09 mg/dL — ABNORMAL HIGH (ref 0.44–1.00)
GFR, Estimated: 14 mL/min — ABNORMAL LOW (ref >60)
Glucose, Bld: 173 mg/dL — ABNORMAL HIGH (ref 70–99)
Phosphorus: 3.6 mg/dL (ref 2.5–4.6)
Potassium: 4.3 mmol/L (ref 3.5–5.1)
Sodium: 124 mmol/L — ABNORMAL LOW (ref 135–145)

## 2023-07-18 LAB — GLUCOSE, CAPILLARY
Glucose-Capillary: 146 mg/dL — ABNORMAL HIGH (ref 70–99)
Glucose-Capillary: 238 mg/dL — ABNORMAL HIGH (ref 70–99)
Glucose-Capillary: 172 mg/dL — ABNORMAL HIGH (ref 70–99)
Glucose-Capillary: 113 mg/dL — ABNORMAL HIGH (ref 70–99)
Glucose-Capillary: 91 mg/dL (ref 70–99)

## 2023-07-18 MED ORDER — LIDOCAINE-PRILOCAINE 2.5-2.5 % EX CREA: 1.0000 | TOPICAL_CREAM | CUTANEOUS | Status: DC | PRN

## 2023-07-18 MED ORDER — HEPARIN SODIUM (PORCINE) 1000 UNIT/ML DIALYSIS
2500.0000 [IU] | Freq: Once | INTRAMUSCULAR | Status: AC
  Administered 2023-07-18: 2500 [IU] via INTRAVENOUS_CENTRAL
  Filled 2023-07-18 (×2): qty 3

## 2023-07-18 MED ORDER — LIDOCAINE HCL (PF) 1 % IJ SOLN: 5.0000 mL | INTRAMUSCULAR | Status: DC | PRN

## 2023-07-18 MED ORDER — HEPARIN SODIUM (PORCINE) 1000 UNIT/ML DIALYSIS: 1000.0000 [IU] | INTRAMUSCULAR | Status: DC | PRN

## 2023-07-18 MED ORDER — ALTEPLASE 2 MG IJ SOLR: 2.0000 mg | Freq: Once | INTRAMUSCULAR | Status: DC | PRN

## 2023-07-18 MED ORDER — ANTICOAGULANT SODIUM CITRATE 4% (200MG/5ML) IV SOLN: 5.0000 mL | Status: DC | PRN

## 2023-07-18 MED ORDER — OXYCODONE-ACETAMINOPHEN 5-325 MG PO TABS
1.0000 | ORAL_TABLET | Freq: Four times a day (QID) | ORAL | Status: DC | PRN
  Filled 2023-07-18: qty 2

## 2023-07-18 MED ORDER — NEPRO/CARBSTEADY PO LIQD: 237.0000 mL | ORAL | Status: DC | PRN

## 2023-07-18 MED ORDER — DOXYCYCLINE HYCLATE 100 MG PO TABS
100.0000 mg | ORAL_TABLET | Freq: Two times a day (BID) | ORAL | Status: DC
Start: 2023-07-18 — End: 2023-07-19
  Administered 2023-07-18: 100 mg via ORAL
  Filled 2023-07-18 (×2): qty 1

## 2023-07-18 MED ORDER — ONDANSETRON HCL 4 MG/2ML IJ SOLN
4.0000 mg | Freq: Three times a day (TID) | INTRAMUSCULAR | Status: DC | PRN
  Administered 2023-07-18: 4 mg via INTRAVENOUS
  Filled 2023-07-18: qty 2

## 2023-07-18 MED ORDER — PENTAFLUOROPROP-TETRAFLUOROETH EX AERO: 1.0000 | INHALATION_SPRAY | CUTANEOUS | Status: DC | PRN

## 2023-07-18 MED ORDER — INSULIN ASPART 100 UNIT/ML IJ SOLN: 0.0000 [IU] | Freq: Three times a day (TID) | INTRAMUSCULAR | Status: DC

## 2023-07-18 MED ORDER — INSULIN ASPART 100 UNIT/ML IJ SOLN: 0.0000 [IU] | Freq: Every day | INTRAMUSCULAR | Status: DC

## 2023-07-18 NOTE — Assessment & Plan Note (Signed)
Patient had 1 session of dialysis yesterday and going for another 1 today for removal of extra fluid Continue to have significant lower extremity edema. Monitor volume status

## 2023-07-18 NOTE — Assessment & Plan Note (Signed)
Thrombocytopenia.  Seems chronic -Stable -Continue to monitor

## 2023-07-18 NOTE — Progress Notes (Addendum)
[facility] Progress Note   Subjective: Seen prior to HD. Doesn't feel well. Pain, N & V, getting PRN meds. Going to HD later this AM.      Objective Vitals:   07/17/23 1557 07/17/23 2000 07/18/23 0442 07/18/23 0914  BP: 108/75 120/75 114/84 114/79  Pulse: 88 89 90 89  Resp: 18 18 18    Temp: 98.8 F (37.1 C) 98.5 F (36.9 C) 97.9 F (36.6 C)   TempSrc:      SpO2: 97% (!) 77% 100% 100%  Weight:      Height:       Physical Exam General: Chronically ill appearing female in NAD Heart: S1,S2 No M/R/G Lungs: CTAB. No WOB.  Abdomen: NABS + Ascites Extremities: 2+ BLE edema Dialysis Access: AVG + T/B   Additional Objective Labs: Basic Metabolic Panel: Recent Labs  Lab 07/16/23 1206 07/16/23 1214 07/16/23 1404 07/17/23 0848  NA 130* 130* 132* 129*  K 5.1 5.2* 4.6 3.6  CL 96* 100  --  90*  CO2 21*  --   --  25  GLUCOSE 28* 25*  --  94  BUN 37* 40*  --  19  CREATININE 5.05* 5.50*  --  3.19*  CALCIUM 7.8*  --   --  8.0*  PHOS  --   --   --  2.2*   Liver Function Tests: Recent Labs  Lab 07/16/23 1206 07/17/23 0848  AST 26 30  ALT 14 15  ALKPHOS 147* 141*  BILITOT 1.4* 2.4*  PROT 6.0* 6.4*  ALBUMIN 2.4* 2.3*   No results for input(s): "LIPASE", "AMYLASE" in the last 168 hours. CBC: Recent Labs  Lab 07/16/23 1206 07/16/23 1214 07/16/23 1404 07/17/23 0848  WBC 2.6*  --   --  3.4*  NEUTROABS 1.6*  --   --   --   HGB 12.1 14.3 10.9* 10.2*  HCT 39.9 42.0 32.0* 31.1*  MCV 95.0  --   --  88.9  PLT 101*  --   --  63*   Blood Culture    Component Value Date/Time   SDES BLOOD RIGHT WRIST 07/16/2023 1159   SPECREQUEST  07/16/2023 1159    BOTTLES DRAWN AEROBIC ONLY Blood Culture results may not be optimal due to an inadequate volume of blood received in culture bottles   CULT  07/16/2023 1159    NO GROWTH 2 DAYS Performed at [facility], [address], [place], [place] [address]    REPTSTATUS PENDING 07/16/2023 1159    Cardiac  Enzymes: Recent Labs  Lab 07/16/23 1206  CKTOTAL 95   CBG: Recent Labs  Lab 07/17/23 1133 07/17/23 1557 07/17/23 1958 07/18/23 0434 07/18/23 0908  GLUCAP 70 71 86 146* 238*   Iron Studies: No results for input(s): "IRON", "TIBC", "TRANSFERRIN", "FERRITIN" in the last 72 hours. @lablastinr3 @ Studies/Results: CT Head Wo Contrast Result Date: 07/17/2023 CLINICAL DATA:  Initial evaluation for acute mental status change, unknown cause. EXAM: CT HEAD WITHOUT CONTRAST TECHNIQUE: Contiguous axial images were obtained from the base of the skull through the vertex without intravenous contrast. RADIATION DOSE REDUCTION: This exam was performed according to the departmental dose-optimization program which includes automated exposure control, adjustment of the mA and/or kV according to patient size and/or use of iterative reconstruction technique. COMPARISON:  Prior MRI from 06/15/2023. FINDINGS: Brain: Cerebral volume within normal limits. No acute intracranial hemorrhage. No acute large vessel territory infarct. No mass lesion, mass effect, or midline shift. No hydrocephalus or extra-axial  fluid collection. Vascular: No abnormal hyperdense vessel. Scattered calcified atherosclerosis present about the skull base, somewhat advanced for age. Skull: Scalp soft tissues within normal limits.  Calvarium intact. Sinuses/Orbits: Globes and orbital soft tissues within normal limits. Visualized paranasal sinuses are largely clear. Small to moderate right mastoid effusion noted. Left mastoid air cells are clear. Other: None. IMPRESSION: 1. No acute intracranial abnormality. 2. Small to moderate right mastoid effusion, of uncertain significance. Correlation with physical exam suggested. 3. Scattered calcified atherosclerosis about the skull base, advanced for age. Electronically Signed   By: Rise Mu M.D.   On: 07/17/2023 20:38   DG Chest Portable 1 View Result Date: 07/16/2023 CLINICAL DATA:  Cough  shortness of breath EXAM: PORTABLE CHEST 1 VIEW COMPARISON:  June 12, 2023 FINDINGS: The cardiomediastinal silhouette is unchanged and enlarged in contour. Small RIGHT pleural effusion. This is favored slightly increased compared to prior. No pneumothorax. Heterogeneous RIGHT basilar airspace opacity with additional scattered linear opacities bilaterally. IMPRESSION: Small RIGHT pleural effusion with favored adjacent atelectasis. Additional scattered areas of atelectasis. Superimposed infection could present similarly. Electronically Signed   By: Meda Klinefelter M.D.   On: 07/16/2023 16:57   Medications:  anticoagulant sodium citrate     sodium chloride Stopped (07/16/23 1346)    amLODipine  5 mg Oral QHS   Chlorhexidine Gluconate Cloth  6 each Topical Q0600   heparin  2,500 Units Dialysis Once in dialysis   heparin  5,000 Units Subcutaneous Q8H   insulin aspart  0-5 Units Subcutaneous QHS   insulin aspart  0-9 Units Subcutaneous TID WC   labetalol  100 mg Oral BID   levothyroxine  25 mcg Oral Q0600   lipase/protease/amylase  72,000 Units Oral TID WC   pantoprazole  40 mg Oral Daily   rosuvastatin  10 mg Oral Daily   sodium chloride flush  3 mL Intravenous Q12H     Dialysis Orders: MWF-[facility] 4 hrs 180NRe 400/Autoflow 1.5 46.5 kg 2.0K/3.0 Ca AVG - Heparin 2500 units IV three times per week - Calcitriol 0.25 mcg PO three times per week - Mircera 120 mcg IV q 4 weeks  (last dose 75 mcg 07/10/2023) - Venofer 50 mg IV weeks (Just finished Fe Load 07/04/2023)  Assessment/Plan: Volume overload - w/ LE anasarca and distended 2-3+ ascites. Pt says w/ dialysis both will improve. CXR showed R pleural effusion and atelectasis.  She reports missing HD 2nd childcare issues. Completed HD 2/16 early morning and 5L was removed. HD today, max UF. Hypoglycemia - per pmd ESRD - on HD MWF. Last OP HD 2/12. Completed HD 2/16 early am. Next HD 2/17 (1st shift) HTN - I changed  low-dose Amlodipine to at bedtime and set parameters to hold AM Labetalol dose on dialysis days. 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 ESA not due yet.   Secondary hyperparathyroidism - CCa in range and phos is low, hold binders for now.  DMT1 - hear w/ hypoglycemia, per pmd    Rita H. Brown NP-C 07/18/2023, 9:56 AM  [facility] [phone]  Pt seen, examined and agree w A/P as above.  Vinson Moselle MD  [facility] 07/18/2023, 3:45 PM

## 2023-07-18 NOTE — Assessment & Plan Note (Signed)
 Continue home amlodipine and labetalol.

## 2023-07-18 NOTE — Assessment & Plan Note (Signed)
Mental status seems to be at baseline now.  Hypoglycemia resolved.  CBG now elevated. Patient has recent TSH mildly elevated with normal free T4. -Discontinue D10 -Counseling was provided to stay compliant with medication

## 2023-07-18 NOTE — Assessment & Plan Note (Signed)
History of labile diabetes.  Initially admitted with persistent hypoglycemia which has been resolved now, D10 was discontinued -Started on sensitive SSI

## 2023-07-18 NOTE — Progress Notes (Signed)
Progress Note   Patient: Ann Robertson ZOX:[id number] DOB: 04-05-1991 DOA: 07/16/2023     1 DOS: the patient was seen and examined on 07/18/2023   Brief hospital course: Taken from H&P.  Deidra Cambrea Kirt is a 33 y.o. female with medical history significant of hypertension, hyperlipidemia, type 1 diabetes, anemia, ESRD, seizure, QTc prolongation, neuropathy presenting with altered mental status.   Son called the EMS stating that she was not acting right, on EMS arrival patient was noted to have CBG of 32 and GCS of 4 and son reported she had not eaten today. Unable to get IV access but they were able to give her glucagon with improvement to glucose to 50 and GCS improved to 9. Vital signs were stable.   She is a dialysis patient and presented significantly volume overloaded, reportedly had dialysis on Wednesday but that was not confirmed.  She was taking her insulin but not eating much a day prior to the admission.  On presentation to ED patient had mildly elevated blood pressure,Lab workup included CMP with sodium 130, chloride 96, Bicarb 21, BUN 37, creatinine 5.5, CBG initially 28, calcium 7.8, protein 6.0, albumin 2.4, alk phos 147, T. bili 1.4. CBG trend 20s, 80s, 40s, 145. CBC with mild leukopenia 3.6 and platelets stable 101. CK normal. Troponin normal, lactic acid at 2 which improved on repeat.VBG with pH 7.5, pCO2 25.   Patient received 500 cc IV bolus and started on D10 after failure to maintain euglycemia with 2 doses of D50.  Nephrology was consulted for dialysis.  2/16: Vitals with mild tachycardia and borderline soft blood pressure this morning.  Had her dialysis earlier.  Respiratory panel negative for COVID, influenza and RSV, TSH mildly elevated at 5.432 but improved from 15 a month prior.  Third hospital admission since late December due to altered mental status secondary to hypo or hyperglycemia.  2/17: Blood glucose level started trending up, discontinue D10 and  starting on SSI.  Started on doxycycline for concern of bilateral lower extremity cellulitis.  Going for another HD today to remove some more fluid   Assessment and Plan: * Acute encephalopathy Mental status seems to be at baseline now.  Hypoglycemia resolved.  CBG now elevated. Patient has recent TSH mildly elevated with normal free T4. -Discontinue D10 -Counseling was provided to stay compliant with medication  Type 1 diabetes mellitus (HCC) History of labile diabetes.  Initially admitted with persistent hypoglycemia which has been resolved now, D10 was discontinued -Started on sensitive SSI   Seizure [facility]) This was noted second to recent admission, believed to be due to hyperglycemia.  Not on any antiepileptic's -Continue to monitor  End-stage renal disease on hemodialysis [facility]) Patient had 1 session of dialysis yesterday and going for another 1 today for removal of extra fluid Continue to have significant lower extremity edema. Monitor volume status  HLD (hyperlipidemia) Continue home atorvastatin  Prolonged QT interval Current QTc normal at 458  Peripheral neuropathy -Continue home gabapentin  Essential hypertension Continue home amlodipine and labetalol  Anemia Thrombocytopenia.  Seems chronic -Stable -Continue to monitor      Subjective: Patient was seen and examined today.  Keeps saying that I am not feeling well and having mild nausea, no vomiting.  Complaining of bilateral foot pain.  Physical Exam: Vitals:   07/18/23 1415 07/18/23 1430 07/18/23 1500 07/18/23 1530  BP: 115/84 121/82 108/85 102/71  Pulse: 91 96 96 94  Resp: 12 19 11 12   Temp:  TempSrc:      SpO2: 100% 100% 100% 100%  Weight:      Height:       General.  Malnourished lady, in no acute distress. Pulmonary.  Lungs clear bilaterally, normal respiratory effort. CV.  Regular rate and rhythm, no JVD, rub or murmur. Abdomen.  Soft, nontender, nondistended, BS positive. CNS.  Alert  and oriented .  No focal neurologic deficit. Extremities.  2+ LE edema with bilateral erythema, no hyperthermia.   Data Reviewed: Prior data reviewed  Family Communication: Discussed with patient  Disposition: Status is: Inpatient Remains inpatient appropriate because: Verity of illness  Planned Discharge Destination: Home  DVT prophylaxis.  Subcu heparin Time spent: 50 minutes  This record has been created using Conservation officer, historic buildings. Errors have been sought and corrected,but may not always be located. Such creation errors do not reflect on the standard of care.   Author: Arnetha Courser, MD 07/18/2023 3:35 PM  For on call review [email or web address].

## 2023-07-18 NOTE — Progress Notes (Signed)
Received patient in bed to unit.  Alert and oriented.  Informed consent signed and in chart.   TX duration: 3 hours  Patient tolerated well.  Transported back to the room  Alert, without acute distress.  Hand-off given to patient's nurse.   Access used: Left Upper Arm graft Access issues: none  Total UF removed: 4L Medication(s) given: Oxycodone-Percocet   07/18/23 1730  Vitals  Temp 98.5 F (36.9 C)  Temp Source Oral  BP 104/79  MAP (mmHg) 88  BP Location Right Arm  BP Method Automatic  Patient Position (if appropriate) Lying  Pulse Rate 96  Pulse Rate Source Monitor  ECG Heart Rate 96  Resp 14  Oxygen Therapy  SpO2 100 %  O2 Device Nasal Cannula  O2 Flow Rate (L/min) 4 L/min  During Treatment Monitoring  Duration of HD Treatment -hour(s) 3 hour(s)  HD Safety Checks Performed Yes  Intra-Hemodialysis Comments Tx completed  Dialysis Fluid Bolus Normal Saline  Bolus Amount (mL) 300 mL  Fistula / Graft Left Upper arm Arteriovenous vein graft  Placement Date/Time: 06/19/21 0850   Placed prior to admission: No  Orientation: Left  Access Location: Upper arm  Access Type: Arteriovenous vein graft  Expiration Date: 02/15/26  Status Deaccessed     Stacie Glaze LPN [facility]

## 2023-07-18 NOTE — Plan of Care (Signed)
  Problem: Clinical Measurements: Goal: Ability to maintain clinical measurements within normal limits will improve Outcome: Progressing Goal: Will remain free from infection Outcome: Progressing Goal: Diagnostic test results will improve Outcome: Progressing Goal: Respiratory complications will improve Outcome: Progressing Goal: Cardiovascular complication will be avoided Outcome: Progressing   Problem: Coping: Goal: Level of anxiety will decrease Outcome: Progressing   Problem: Elimination: Goal: Will not experience complications related to bowel motility Outcome: Progressing Goal: Will not experience complications related to urinary retention Outcome: Progressing   Problem: Pain Managment: Goal: General experience of comfort will improve and/or be controlled Outcome: Progressing   Problem: Safety: Goal: Ability to remain free from injury will improve Outcome: Progressing   Problem: Skin Integrity: Goal: Risk for impaired skin integrity will decrease Outcome: Progressing   Problem: Education: Goal: Ability to describe self-care measures that may prevent or decrease complications (Diabetes Survival Skills Education) will improve Outcome: Progressing Goal: Individualized Educational Video(s) Outcome: Progressing   Problem: Coping: Goal: Ability to adjust to condition or change in health will improve Outcome: Progressing   Problem: Fluid Volume: Goal: Ability to maintain a balanced intake and output will improve Outcome: Progressing   Problem: Health Behavior/Discharge Planning: Goal: Ability to identify and utilize available resources and services will improve Outcome: Progressing Goal: Ability to manage health-related needs will improve Outcome: Progressing   Problem: Metabolic: Goal: Ability to maintain appropriate glucose levels will improve Outcome: Progressing   Problem: Nutritional: Goal: Progress toward achieving an optimal weight will  improve Outcome: Progressing   Problem: Skin Integrity: Goal: Risk for impaired skin integrity will decrease Outcome: Progressing   Problem: Tissue Perfusion: Goal: Adequacy of tissue perfusion will improve Outcome: Progressing   Problem: Activity: Goal: Risk for activity intolerance will decrease Outcome: Not Progressing   Problem: Nutrition: Goal: Adequate nutrition will be maintained Outcome: Not Progressing   Problem: Nutritional: Goal: Maintenance of adequate nutrition will improve Outcome: Not Progressing Pt off dextrose drip but not eating yet but had HD this afternoon

## 2023-07-19 ENCOUNTER — Inpatient Hospital Stay (HOSPITAL_COMMUNITY): Payer: 59

## 2023-07-19 ENCOUNTER — Inpatient Hospital Stay (HOSPITAL_COMMUNITY): Payer: 59 | Admitting: Critical Care Medicine

## 2023-07-19 DIAGNOSIS — J9601 Acute respiratory failure with hypoxia: Secondary | ICD-10-CM | POA: Diagnosis not present

## 2023-07-19 DIAGNOSIS — E10649 Type 1 diabetes mellitus with hypoglycemia without coma: Principal | ICD-10-CM

## 2023-07-19 DIAGNOSIS — E039 Hypothyroidism, unspecified: Secondary | ICD-10-CM

## 2023-07-19 DIAGNOSIS — I469 Cardiac arrest, cause unspecified: Secondary | ICD-10-CM

## 2023-07-19 DIAGNOSIS — M7989 Other specified soft tissue disorders: Secondary | ICD-10-CM

## 2023-07-19 DIAGNOSIS — D638 Anemia in other chronic diseases classified elsewhere: Secondary | ICD-10-CM

## 2023-07-19 DIAGNOSIS — E785 Hyperlipidemia, unspecified: Secondary | ICD-10-CM

## 2023-07-19 DIAGNOSIS — E43 Unspecified severe protein-calorie malnutrition: Secondary | ICD-10-CM | POA: Insufficient documentation

## 2023-07-19 DIAGNOSIS — R569 Unspecified convulsions: Secondary | ICD-10-CM | POA: Diagnosis not present

## 2023-07-19 DIAGNOSIS — G9340 Encephalopathy, unspecified: Secondary | ICD-10-CM | POA: Diagnosis not present

## 2023-07-19 DIAGNOSIS — R188 Other ascites: Secondary | ICD-10-CM

## 2023-07-19 LAB — MAGNESIUM: Magnesium: 1.8 mg/dL (ref 1.7–2.4)

## 2023-07-19 LAB — MRSA NEXT GEN BY PCR, NASAL: MRSA by PCR Next Gen: NOT DETECTED

## 2023-07-19 LAB — RENAL FUNCTION PANEL
Albumin: 2.3 g/dL — ABNORMAL LOW (ref 3.5–5.0)
Anion gap: 15 (ref 5–15)
BUN: 22 mg/dL — ABNORMAL HIGH (ref 6–20)
CO2: 23 mmol/L (ref 22–32)
Calcium: 7.9 mg/dL — ABNORMAL LOW (ref 8.9–10.3)
Chloride: 90 mmol/L — ABNORMAL LOW (ref 98–111)
Creatinine, Ser: 3.17 mg/dL — ABNORMAL HIGH (ref 0.44–1.00)
GFR, Estimated: 19 mL/min — ABNORMAL LOW (ref >60)
Glucose, Bld: 148 mg/dL — ABNORMAL HIGH (ref 70–99)
Phosphorus: 3.4 mg/dL (ref 2.5–4.6)
Potassium: 4.3 mmol/L (ref 3.5–5.1)
Sodium: 128 mmol/L — ABNORMAL LOW (ref 135–145)

## 2023-07-19 LAB — GLUCOSE, CAPILLARY
Glucose-Capillary: 60 mg/dL — ABNORMAL LOW (ref 70–99)
Glucose-Capillary: 119 mg/dL — ABNORMAL HIGH (ref 70–99)
Glucose-Capillary: 112 mg/dL — ABNORMAL HIGH (ref 70–99)
Glucose-Capillary: 118 mg/dL — ABNORMAL HIGH (ref 70–99)
Glucose-Capillary: 109 mg/dL — ABNORMAL HIGH (ref 70–99)
Glucose-Capillary: 125 mg/dL — ABNORMAL HIGH (ref 70–99)
Glucose-Capillary: 166 mg/dL — ABNORMAL HIGH (ref 70–99)
Glucose-Capillary: 65 mg/dL — ABNORMAL LOW (ref 70–99)

## 2023-07-19 LAB — TROPONIN I (HIGH SENSITIVITY)
Troponin I (High Sensitivity): 14 ng/L (ref <18)
Troponin I (High Sensitivity): 20 ng/L — ABNORMAL HIGH (ref <18)

## 2023-07-19 LAB — ECHOCARDIOGRAM COMPLETE
AR max vel: 1.95 cm2
AV Area VTI: 2.04 cm2
AV Area mean vel: 1.92 cm2
AV Mean grad: 3 mm[Hg]
AV Peak grad: 5.9 mm[Hg]
Ao pk vel: 1.21 m/s
Area-P 1/2: 5.88 cm2
Height: 65 in
S' Lateral: 3 cm
Weight: 1975.32 [oz_av]

## 2023-07-19 LAB — CBC
HCT: 32.3 % — ABNORMAL LOW (ref 36.0–46.0)
Hemoglobin: 10.3 g/dL — ABNORMAL LOW (ref 12.0–15.0)
MCH: 29 pg (ref 26.0–34.0)
MCHC: 31.9 g/dL (ref 30.0–36.0)
MCV: 91 fL (ref 80.0–100.0)
Platelets: 37 10*3/uL — ABNORMAL LOW (ref 150–400)
RBC: 3.55 MIL/uL — ABNORMAL LOW (ref 3.87–5.11)
RDW: 17.2 % — ABNORMAL HIGH (ref 11.5–15.5)
WBC: 5.6 10*3/uL (ref 4.0–10.5)
nRBC: 0 % (ref 0.0–0.2)

## 2023-07-19 LAB — POCT I-STAT 7, (LYTES, BLD GAS, ICA,H+H)
Acid-Base Excess: 6 mmol/L — ABNORMAL HIGH (ref 0.0–2.0)
Bicarbonate: 29.4 mmol/L — ABNORMAL HIGH (ref 20.0–28.0)
Calcium, Ion: 1.03 mmol/L — ABNORMAL LOW (ref 1.15–1.40)
HCT: 34 % — ABNORMAL LOW (ref 36.0–46.0)
Hemoglobin: 11.6 g/dL — ABNORMAL LOW (ref 12.0–15.0)
O2 Saturation: 90 %
Patient temperature: 35
Potassium: 4 mmol/L (ref 3.5–5.1)
Sodium: 128 mmol/L — ABNORMAL LOW (ref 135–145)
TCO2: 31 mmol/L (ref 22–32)
pCO2 arterial: 34.4 mm[Hg] (ref 32–48)
pH, Arterial: 7.532 — ABNORMAL HIGH (ref 7.35–7.45)
pO2, Arterial: 47 mm[Hg] — ABNORMAL LOW (ref 83–108)

## 2023-07-19 LAB — LACTIC ACID, PLASMA
Lactic Acid, Venous: 3.4 mmol/L (ref 0.5–1.9)
Lactic Acid, Venous: 2 mmol/L (ref 0.5–1.9)
Lactic Acid, Venous: 1.8 mmol/L (ref 0.5–1.9)

## 2023-07-19 LAB — HEPATIC FUNCTION PANEL
ALT: 12 U/L (ref 0–44)
AST: 34 U/L (ref 15–41)
Albumin: 2.4 g/dL — ABNORMAL LOW (ref 3.5–5.0)
Alkaline Phosphatase: 140 U/L — ABNORMAL HIGH (ref 38–126)
Bilirubin, Direct: 1.4 mg/dL — ABNORMAL HIGH (ref 0.0–0.2)
Indirect Bilirubin: 1.6 mg/dL — ABNORMAL HIGH (ref 0.3–0.9)
Total Bilirubin: 3 mg/dL — ABNORMAL HIGH (ref 0.0–1.2)
Total Protein: 6.5 g/dL (ref 6.5–8.1)

## 2023-07-19 LAB — HEPATITIS B SURFACE ANTIBODY, QUANTITATIVE: Hep B S AB Quant (Post): 458 m[IU]/mL

## 2023-07-19 MED ORDER — PROPOFOL 1000 MG/100ML IV EMUL
0.0000 ug/kg/min | INTRAVENOUS | Status: DC
  Administered 2023-07-19: 5 ug/kg/min via INTRAVENOUS
  Administered 2023-07-20: 10 ug/kg/min via INTRAVENOUS
  Administered 2023-07-20 – 2023-07-21 (×3): 25 ug/kg/min via INTRAVENOUS
  Filled 2023-07-19 (×5): qty 100

## 2023-07-19 MED ORDER — ACETAMINOPHEN 160 MG/5ML PO SOLN
650.0000 mg | ORAL | Status: AC
  Administered 2023-07-19 – 2023-07-20 (×7): 650 mg
  Filled 2023-07-19 (×8): qty 20.3

## 2023-07-19 MED ORDER — DOXYCYCLINE HYCLATE 100 MG PO TABS
100.0000 mg | ORAL_TABLET | Freq: Two times a day (BID) | ORAL | Status: DC
  Administered 2023-07-19 – 2023-07-20 (×3): 100 mg
  Filled 2023-07-19 (×3): qty 1

## 2023-07-19 MED ORDER — ORAL CARE MOUTH RINSE
15.0000 mL | OROMUCOSAL | Status: DC
  Administered 2023-07-19 – 2023-07-21 (×30): 15 mL via OROMUCOSAL

## 2023-07-19 MED ORDER — FENTANYL 2500MCG IN NS 250ML (10MCG/ML) PREMIX INFUSION
50.0000 ug/h | INTRAVENOUS | Status: DC
Start: 2023-07-19 — End: 2023-07-21
  Administered 2023-07-19: 50 ug/h via INTRAVENOUS
  Administered 2023-07-19: 100 ug/h via INTRAVENOUS
  Administered 2023-07-20 – 2023-07-21 (×2): 150 ug/h via INTRAVENOUS
  Filled 2023-07-19 (×4): qty 250

## 2023-07-19 MED ORDER — ROSUVASTATIN CALCIUM 5 MG PO TABS
10.0000 mg | ORAL_TABLET | Freq: Every day | ORAL | Status: DC
  Administered 2023-07-19: 10 mg
  Filled 2023-07-19: qty 2

## 2023-07-19 MED ORDER — INSULIN ASPART 100 UNIT/ML IJ SOLN
0.0000 [IU] | INTRAMUSCULAR | Status: DC
  Administered 2023-07-19 – 2023-07-20 (×2): 1 [IU] via SUBCUTANEOUS

## 2023-07-19 MED ORDER — NOREPINEPHRINE 4 MG/250ML-% IV SOLN
0.0000 ug/min | INTRAVENOUS | Status: DC
  Administered 2023-07-19: 2 ug/min via INTRAVENOUS
  Filled 2023-07-19: qty 250

## 2023-07-19 MED ORDER — VITAL 1.5 CAL PO LIQD
1000.0000 mL | ORAL | Status: DC
  Administered 2023-07-19: 1000 mL

## 2023-07-19 MED ORDER — ORAL CARE MOUTH RINSE: 15.0000 mL | OROMUCOSAL | Status: DC | PRN

## 2023-07-19 MED ORDER — ACETAMINOPHEN 325 MG PO TABS: 650.0000 mg | ORAL_TABLET | ORAL | Status: AC

## 2023-07-19 MED ORDER — IOHEXOL 350 MG/ML SOLN
75.0000 mL | Freq: Once | INTRAVENOUS | Status: AC | PRN
  Administered 2023-07-19: 75 mL via INTRAVENOUS

## 2023-07-19 MED ORDER — LEVOTHYROXINE SODIUM 25 MCG PO TABS
25.0000 ug | ORAL_TABLET | Freq: Every day | ORAL | Status: DC
  Administered 2023-07-19 – 2023-07-20 (×2): 25 ug
  Filled 2023-07-19 (×2): qty 1

## 2023-07-19 MED ORDER — ACETAMINOPHEN 160 MG/5ML PO SOLN: 650.0000 mg | Freq: Four times a day (QID) | ORAL | Status: DC | PRN

## 2023-07-19 MED ORDER — ACETAMINOPHEN 650 MG RE SUPP: 650.0000 mg | RECTAL | Status: AC

## 2023-07-19 MED ORDER — SODIUM CHLORIDE 0.9 % IV SOLN
250.0000 mL | INTRAVENOUS | Status: AC
Start: 2023-07-19 — End: 2023-07-20

## 2023-07-19 MED ORDER — PANTOPRAZOLE SODIUM 40 MG IV SOLR
40.0000 mg | Freq: Every day | INTRAVENOUS | Status: DC
  Administered 2023-07-19 – 2023-07-20 (×2): 40 mg via INTRAVENOUS
  Filled 2023-07-19 (×2): qty 10

## 2023-07-19 MED ORDER — INSULIN ASPART 100 UNIT/ML IJ SOLN: 0.0000 [IU] | Freq: Four times a day (QID) | INTRAMUSCULAR | Status: DC | PRN

## 2023-07-19 MED ORDER — PROPOFOL 1000 MG/100ML IV EMUL
INTRAVENOUS | Status: AC
  Administered 2023-07-19: 5 ug/kg/min via INTRAVENOUS
  Filled 2023-07-19: qty 100

## 2023-07-19 MED ORDER — ATORVASTATIN CALCIUM 10 MG PO TABS
10.0000 mg | ORAL_TABLET | Freq: Every day | ORAL | Status: DC
  Administered 2023-07-20: 10 mg
  Filled 2023-07-19: qty 1

## 2023-07-19 MED ORDER — CALCIUM GLUCONATE-NACL 1-0.675 GM/50ML-% IV SOLN
1.0000 g | Freq: Once | INTRAVENOUS | Status: AC
  Administered 2023-07-19: 1000 mg via INTRAVENOUS
  Filled 2023-07-19: qty 50

## 2023-07-19 MED ORDER — FENTANYL BOLUS VIA INFUSION
50.0000 ug | INTRAVENOUS | Status: DC | PRN
  Administered 2023-07-19 (×2): 50 ug via INTRAVENOUS
  Administered 2023-07-20: 100 ug via INTRAVENOUS

## 2023-07-19 MED ORDER — BUSPIRONE HCL 10 MG PO TABS: 30.0000 mg | ORAL_TABLET | Freq: Three times a day (TID) | ORAL | Status: DC | PRN

## 2023-07-19 MED ORDER — ACETAMINOPHEN 650 MG RE SUPP: 650.0000 mg | RECTAL | Status: DC | PRN

## 2023-07-19 MED ORDER — PROSOURCE TF20 ENFIT COMPATIBL EN LIQD
60.0000 mL | Freq: Every day | ENTERAL | Status: DC
  Administered 2023-07-19 – 2023-07-20 (×2): 60 mL
  Filled 2023-07-19 (×2): qty 60

## 2023-07-19 MED ORDER — POLYETHYLENE GLYCOL 3350 17 G PO PACK
17.0000 g | PACK | Freq: Every day | ORAL | Status: DC
  Administered 2023-07-19: 17 g
  Filled 2023-07-19: qty 1

## 2023-07-19 MED ORDER — DEXTROSE 50 % IV SOLN
INTRAVENOUS | Status: AC
  Administered 2023-07-19: 50 mL
  Filled 2023-07-19: qty 50

## 2023-07-19 MED ORDER — DEXTROSE 50 % IV SOLN
INTRAVENOUS | Status: AC
Start: 2023-07-19 — End: 2023-07-19
  Administered 2023-07-19: 25 mL via INTRAVENOUS
  Filled 2023-07-19: qty 50

## 2023-07-19 MED ORDER — MAGNESIUM SULFATE 2 GM/50ML IV SOLN
2.0000 g | Freq: Once | INTRAVENOUS | Status: DC
  Filled 2023-07-19: qty 50

## 2023-07-19 MED ORDER — FENTANYL CITRATE PF 50 MCG/ML IJ SOSY: 50.0000 ug | PREFILLED_SYRINGE | Freq: Once | INTRAMUSCULAR | Status: DC

## 2023-07-19 MED ORDER — SODIUM CHLORIDE 0.9 % IV SOLN: 250.0000 mL | INTRAVENOUS | Status: AC

## 2023-07-19 MED ORDER — DEXTROSE 50 % IV SOLN: 25.0000 mL | Freq: Once | INTRAVENOUS | Status: AC

## 2023-07-19 MED ORDER — DEXTROSE-SODIUM CHLORIDE 5-0.9 % IV SOLN: INTRAVENOUS | Status: DC

## 2023-07-19 MED ORDER — ACETAMINOPHEN 160 MG/5ML PO SOLN: 650.0000 mg | ORAL | Status: DC | PRN

## 2023-07-19 MED ORDER — THIAMINE MONONITRATE 100 MG PO TABS
100.0000 mg | ORAL_TABLET | Freq: Every day | ORAL | Status: DC
Start: 2023-07-20 — End: 2023-07-27

## 2023-07-19 MED ORDER — NOREPINEPHRINE 4 MG/250ML-% IV SOLN: 2.0000 ug/min | INTRAVENOUS | Status: DC

## 2023-07-19 MED ORDER — ACETAMINOPHEN 325 MG PO TABS: 650.0000 mg | ORAL_TABLET | ORAL | Status: DC | PRN

## 2023-07-19 MED ORDER — DOCUSATE SODIUM 50 MG/5ML PO LIQD
100.0000 mg | Freq: Two times a day (BID) | ORAL | Status: DC
  Administered 2023-07-19 (×2): 100 mg
  Filled 2023-07-19 (×2): qty 10

## 2023-07-19 MED ORDER — MAGNESIUM SULFATE 2 GM/50ML IV SOLN
2.0000 g | Freq: Once | INTRAVENOUS | Status: DC | PRN
  Administered 2023-07-19: 2 g via INTRAVENOUS

## 2023-07-19 NOTE — Progress Notes (Signed)
 EEG complete - results pending

## 2023-07-19 NOTE — Anesthesia Procedure Notes (Signed)
Procedure Name: Intubation Date/Time: 07/19/2023 1:09 AM  Performed by: Rachel Moulds, CRNAPre-anesthesia Checklist: Patient identified, Emergency Drugs available, Suction available, Patient being monitored and Timeout performed Oxygen Delivery Method: Ambu bag Preoxygenation: Pre-oxygenation with 100% oxygen Induction Type: IV induction Grade View: Grade II Tube type: Oral Tube size: 7.5 mm Number of attempts: 1 Airway Equipment and Method: Rigid stylet and Video-laryngoscopy Placement Confirmation: breath sounds checked- equal and bilateral, positive ETCO2, CO2 detector and ETT inserted through vocal cords under direct vision Secured at: 21 cm Tube secured with: Tape Dental Injury: Teeth and Oropharynx as per pre-operative assessment

## 2023-07-19 NOTE — Consult Note (Signed)
NAME:  Ann Robertson, MRN:  [id number], DOB:  [birth year], LOS: 2 ADMISSION DATE:  07/16/2023 CONSULTATION DATE:  07/19/2023 REFERRING MD:  Nelson Chimes - TRH, CHIEF COMPLAINT:  Post-cardiac arrest   History of Present Illness:  33 year old woman who initially presented to [facility] 2/15 for AMS. PMHx significant for HTN, HLD, T1DM (c/b DKA), ESRD (on HD MWF via LUE AVF), anemia, seizure, HSV2.  Per chart review, patient's son called EMS when she began acting abnormally; GCS was noted to be 4 with CBG 32. Patient's son endorses that she has had poor PO intake. Glucagon was administered with improvement in GCS to 9 and CBG to 50. Continued to be hypoglycemic in ED requiring D50 amps x 2 and subsequent D10 gtt initiation. Additional concern for possible sepsis with empiric broad-spectrum antibiotics started (vanc/cefepime). Nephrology was consulted for continuation of HD.  On 2/18AM, patient was noted to be unresponsive and pulseless with initial rhythm PEA. Code Blue was called and CPR administered x 5 minutes with Epi x 2 and ROSC. Patient was intubated peri-code. She was subsequently transferred to [facility] CVICU.  PCCM consulted for ICU transfer post-arrest.  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 Initial rhythm PEA. CPR x 5 minutes, Epi x 2 with ROSC.  Intubated peri-code. Transferred to 2H for further care, PCCM consulted for ICU management.  Interim History / Subjective:  PCCM consulted for ICU transfer and management CBG 60 on arrival to 2H, D50 x 1 amp given CBG improved to 119 Awake, not alert/responsive; grimaces to painful/noxious stimuli Labs/further workup pending  Objective:  Blood pressure (!) 128/91, pulse 96, temperature 98.4 F (36.9 C), temperature source Oral, resp. rate 16, height 5\' 5"  (1.651 m), weight 55.8 kg, SpO2 100%.        Intake/Output Summary (Last 24 hours) at 07/19/2023 0239 Last data filed at 07/18/2023 1730 Gross per 24 hour  Intake 3615.31 ml  Output 4000 ml  Net -384.69 ml   Filed Weights   07/17/23 0856 07/18/23 1400 07/18/23 1756  Weight: 60.4 kg 59.8 kg 55.8 kg   Physical Examination: General: Acute-on-chronically ill-appearing woman in NAD. Appears uncomfortable. HEENT: Ashley/AT, anicteric sclera, PERRL40mm sluggish, moist mucous membranes. Neuro:  Awake, not alert.  Eyes open/blinking but not responding. Grimaces to noxious stimuli. Withdraws from pain in BUE, flicker to pain in BLE. Not following commands. +Corneal, +Cough, and +Gag  CV: RRR, no m/g/r. PULM: Breathing even and unlabored on vent (PEEP 5, FiO2 100%). Lung fields grossly CTAB. GI: Firm, moderate to severe distention with tense ascites (confirmed on bedside POCUS). Hypoactive bowel sounds. Extremities: Bilateral symmetric 2+ pitting LE edema noted. LUE AVF with +thrill. Skin: Warm/dry, no rashes. Mild bilateral diffuse erythema of LE. Prior low transverse C-section scar, well-healed. Generalized muscle wasting.  Resolved Hospital Problem List:    Assessment & Plan:  Post-cardiac arrest - Admit to ICU for close monitoring - Targeted temperature management/normothermia protocol - Goal MAP > 65 - Cautious fluid resuscitation, as patient has ESRD/is on HD - Not requiring vasopressors at present - Trend troponin - F/u  Echo  Acute encephalopathy, initially presumed metabolic, now s/p cardiac arrest History of seizure CT Head 2/16 NAICA, small to moderate R mastoid effusion. - Spot EEG - Correct metabolic derangements, RRT per Nephro - Limit interruptions to sleep/wake cycle as able - If neurologic exam not improving 72H post-arrest, consider MRI Brain  Acute hypoxemic respiratory failure in the setting of cardiac arrest - Continue full vent support (4-8cc/kg IBW) - Wean FiO2 for O2 sat > 90% - Daily WUA/SBT once appropriate from a mental status standpoint - VAP bundle - Pulmonary hygiene - PAD protocol for sedation: Fentanyl for goal RASS 0 to -1, hold on Prop given poor mental status and prolonged QTc - Follow CXR, ABG  T1DM with hypoglycemia Pancreatic insufficiency History of T1DM with multiple hypoglycemic events. Poor/inconsistent PO intake. Previously had DKA. - DM coordinator consult placed - F/u C-peptide - D50 amp x 1 given post-code, repeat CBG 119; repeat in 1 hour - If persistently hypoglycemic, D10 gtt initiation - Goal CBG 140-180 - Creon held while NPO  Hypothyroidism - Continue levothyroxine  HTN HLD - Hold home antihypertensives peri-arrest, given labile hemodynamics - Resume home Crestor - ASA start  ESRD on HD MWF Oliguria/anuria - Nephro following, appreciate assistance with management - Continue iHD if BP tolerates; may require Trialysis catheter placement if she becomes hypotensive - Trend BMP - Replete electrolytes as indicated - Monitor I&Os - Avoid nephrotoxic agents as able  Anemia of chronic disease Thrombocytopenia, presume liver related? - Trend H&H, Plt - Monitor for signs of active bleeding - Transfuse for Hgb < 7.0, Plt < 20K or hemodynamically significant bleeding  Ascites Anasarca Malnutrition Small amount of pelvic free fluid noted as early as 10/2020, increasing volume of abdominopelvic ascites and anasarca since (noted to be massive ascites  05/2023). ? Nodular contour of liver noted on US Abdomen 01/2022, Bedside POCUS 2/18AM with significant abdominopelvic ascites, simple-appearing on [place].  - Consider diagnostic/therapeutic para if indicated - Follow nutrition labs - Nutrition/RD consult, DM coordinator consult as above, appreciate assistance  Best Practice: (right click and "Reselect all SmartList Selections" daily)   Diet/type: NPO DVT prophylaxis: SCDs, SQH? GI prophylaxis: PPI Lines: N/A Foley:  N/A Code Status:  full code Last date of multidisciplinary goals of care discussion [Pending]  Labs:  CBC: Recent Labs  Lab 07/16/23 1206 07/16/23 1214 07/16/23 1404 07/17/23 0848 07/18/23 1408 07/19/23 0216  WBC 2.6*  --   --  3.4* 7.0 5.6  NEUTROABS 1.6*  --   --   --   --   --   HGB 12.1 14.3 10.9* 10.2* 9.3* 10.3*  HCT 39.9 42.0 32.0* 31.1* 28.5* 32.3*  MCV 95.0  --   --  88.9 89.6 91.0  PLT 101*  --   --  63* 44* 37*   Basic Metabolic Panel: Recent Labs  Lab 07/16/23 1206 07/16/23 1214 07/16/23 1404 07/17/23 0848 07/18/23 1408  NA 130* 130* 132* 129* 124*  K 5.1 5.2* 4.6 3.6 4.3  CL 96* 100  --  90* 89*  CO2 21*  --   --  25 25  GLUCOSE 28* 25*  --  94 173*  BUN 37* 40*  --  19 31*  CREATININE 5.05* 5.50*  --  3.19* 4.09*  CALCIUM 7.8*  --   --  8.0* 7.5*  PHOS  --   --   --  2.2* 3.6   GFR: Estimated Creatinine Clearance: 17.1 mL/min (A) (by C-G formula based on SCr of 4.09 mg/dL (H)). Recent Labs  Lab 07/16/23 1206 07/16/23 1214 07/16/23 1401 07/17/23 0848 07/18/23 1408 07/19/23 0216  WBC 2.6*  --   --  3.4* 7.0 5.6  LATICACIDVEN  --  2.0* 1.6  --   --   --    Liver Function Tests: Recent Labs  Lab 07/16/23 1206 07/17/23 0848 07/18/23 1408  AST 26 30  --   ALT 14 15  --   ALKPHOS 147* 141*  --   BILITOT 1.4* 2.4*  --   PROT 6.0* 6.4*  --   ALBUMIN 2.4* 2.3* 2.1*   No results for input(s): "LIPASE", "AMYLASE" in the last 168 hours. No results for input(s): "AMMONIA" in the  last 168 hours.  ABG:    Component Value Date/Time   PHART 7.371 06/12/2023 2201   PCO2ART 49.3 (H) 06/12/2023 2201   PO2ART 210 (H) 06/12/2023 2201   HCO3 20.0 07/16/2023 1404   TCO2 21 (L) 07/16/2023 1404   ACIDBASEDEF 2.0 07/16/2023 1404   O2SAT 100 07/16/2023 1404    Coagulation Profile: No results for input(s): "INR", "PROTIME" in the last 168 hours.  Cardiac Enzymes: Recent Labs  Lab 07/16/23 1206  CKTOTAL 95   HbA1C: HbA1c POC (<> result, manual entry)  Date/Time Value Ref Range Status  09/10/2020 01:35 PM >15 4.0 - 5.6 % Final   Hgb A1c MFr Bld  Date/Time Value Ref Range Status  05/30/2023 01:12 AM >15.5 (H) 4.8 - 5.6 % Final    Comment:    (NOTE)         Prediabetes: 5.7 - 6.4         Diabetes: >6.4         Glycemic control for adults with diabetes: <7.0   02/08/2022 09:41 PM 14.3 (H) 4.8 - 5.6 % Final    Comment:    (NOTE) Pre diabetes:          5.7%-6.4%  Diabetes:              >6.4%  Glycemic control for   <7.0% adults with diabetes    CBG: Recent Labs  Lab 07/18/23 1206 07/18/23 1825 07/18/23 2027 07/19/23 0129 07/19/23 0218  GLUCAP 172* 113* 91 60* 119*   Review of Systems:   Patient is encephalopathic and/or intubated; therefore, history has been obtained from chart review.   Past Medical History:  She,  has a past medical history of Acute metabolic encephalopathy (04/18/2021), DKA (diabetic ketoacidosis) (HCC) (06/04/2022), ESRD (end stage renal disease) (HCC), High cholesterol, History of pre-eclampsia in prior pregnancy, currently pregnant (07/14/2015), History of preterm delivery, currently pregnant (07/14/2015), HSV-2 infection, Juvenile diabetes (05/31/1998), Neuropathy, Preeclampsia, and STD (sexually transmitted disease).   Surgical History:   Past Surgical History:  Procedure Laterality Date   AV FISTULA PLACEMENT Left 06/19/2021   Procedure: INSERTION OF ARTERIOVENOUS (AV) GORE-TEX GRAFT ARM;  Surgeon: Chuck Hint,  MD;  Location: [facility] OR;  Service: Vascular;  Laterality: Left;   CESAREAN SECTION     CESAREAN SECTION N/A 07/05/2013   Procedure: CESAREAN SECTION;  Surgeon: Allie Bossier, MD;  Location: [facility] ORS;  Service: Obstetrics;  Laterality: N/A;   CESAREAN SECTION N/A 07/25/2014  Procedure: CESAREAN SECTION;  Surgeon: Reva Bores, MD;  Location: [facility] ORS;  Service: Obstetrics;  Laterality: N/A;   CESAREAN SECTION N/A 11/21/2015   Procedure: CESAREAN SECTION;  Surgeon: Reva Bores, MD;  Location: [facility] BIRTHING SUITES;  Service: Obstetrics;  Laterality: N/A;   IR FLUORO GUIDE CV LINE RIGHT  06/16/2021   IR US GUIDE VASC ACCESS RIGHT  06/16/2021   WISDOM TOOTH EXTRACTION     Social History:   reports that she has been smoking cigarettes. She has a 2.3 pack-year smoking history. She has never used smokeless tobacco. She reports that she does not currently use alcohol. She reports that she does not use drugs.   Family History:  Her family history includes Diabetes in her father; Heart disease in her mother; Hyperlipidemia in her father; Hypertension in her mother; Kidney disease in her mother; Stroke in her mother. There is no history of Seizures.   Allergies: Allergies  Allergen Reactions   Dilaudid [Hydromorphone] Itching and Rash   Home Medications: Prior to Admission medications   Medication Sig Start Date End Date Taking? Authorizing Provider  amLODipine (NORVASC) 5 MG tablet Take 1 tablet (5 mg total) by mouth daily. 06/18/23 07/18/23  Willeen Niece, MD  polycarbophil (FIBERCON) 625 MG tablet Take 1 tablet (625 mg total) by mouth 2 (two) times daily. Patient not taking: Reported on 06/13/2023 02/03/23   Carmina Miller, DO  gabapentin (NEURONTIN) 100 MG capsule Take 1 capsule (100 mg total) by mouth 3 (three) times daily as needed (neuropathy). 02/03/23   Carmina Miller, DO  hydrOXYzine (ATARAX) 25 MG tablet Take 25 mg by mouth daily as needed for anxiety.    [provider]  insulin glargine (LANTUS  SOLOSTAR) 100 UNIT/ML Solostar Pen Inject 3 Units into the skin daily.    [provider]  insulin lispro (HUMALOG) 100 UNIT/ML KwikPen Inject 0-6 Units into the skin 3 (three) times daily with meals. Check Blood Glucose (BG) and inject per scale: BG <150= 0 unit; BG 150-200= 1 unit; BG 201-250= 2 unit; BG 251-300= 3 unit; BG 301-350= 4 unit; BG 351-400= 5 unit; BG >400= 6 unit and Call Primary Care. 02/03/23   Carmina Miller, DO  labetalol (NORMODYNE) 100 MG tablet Take 1 tablet (100 mg total) by mouth 2 (two) times daily. 06/17/23 07/17/23  Willeen Niece, MD  levothyroxine (SYNTHROID) 25 MCG tablet Take 1 tablet (25 mcg total) by mouth daily at 6 (six) AM. 06/18/23 07/18/23  Willeen Niece, MD  lidocaine-prilocaine (EMLA) cream Apply 1 Application topically as needed (For port access). 02/03/23   Carmina Miller, DO  lipase/protease/amylase (CREON) 36000 UNITS CPEP capsule Take 2 capsules (72,000 Units total) by mouth 3 (three) times daily with meals. May also take 1 capsule (36,000 Units total) as needed (with snacks - up to 4 snacks daily). 02/14/23   Carmina Miller, DO  loperamide (IMODIUM) 2 MG capsule Take 2 capsules (4 mg total) by mouth as needed for diarrhea or loose stools. Not to exceed 8 caps per day 02/03/23   Carmina Miller, DO  methocarbamol (ROBAXIN) 750 MG tablet Take 1 tablet (750 mg total) by mouth every 8 (eight) hours as needed for muscle spasms. 02/03/23   Carmina Miller, DO  ondansetron (ZOFRAN) 4 MG tablet Take 1 tablet (4 mg total) by mouth every 8 (eight) hours as needed for nausea or vomiting. 06/02/23   Tyrone Nine, MD  oxyCODONE-acetaminophen (PERCOCET) 10-325 MG tablet Take 1 tablet by mouth every 6 (six)  hours as needed for pain.    [provider]  pantoprazole (PROTONIX) 40 MG tablet Take 1 tablet (40 mg total) by mouth daily. 06/02/23   Tyrone Nine, MD  rosuvastatin (CRESTOR) 10 MG tablet Take 1 tablet (10 mg total) by mouth daily. 02/04/23   Carmina Miller, DO     Critical care time:   The patient is critically ill with multiple organ system failure and requires high complexity decision making for assessment and support, frequent evaluation and titration of therapies, advanced monitoring, review of radiographic studies and interpretation of complex data.   Critical Care Time devoted to patient care services, exclusive of separately billable procedures, described in this note is 44 minutes.  Tim Lair, PA-C [facility] 07/19/23 2:39 AM  Please see [email or web address] for pager details.  From 7A-7P if no response, please call [phone] After hours, please call ELink [phone]

## 2023-07-19 NOTE — Progress Notes (Signed)
RT transported pt from 2H05 to CT1 and back with RN and transport at bedside. No complications at this time.

## 2023-07-19 NOTE — Procedures (Signed)
Patient Name: Ann Robertson  MRN: [id number]  Epilepsy Attending: Charlsie Quest  Referring Physician/Provider: Tim Lair, PA-C  Date: 07/19/2023 Duration: 23 mins  Patient history: 33yo F s/p cardiac arrest. EEG to evaluate for seizure  Level of alertness: lethargic   AEDs during EEG study: propofol  Technical aspects: This EEG study was done with scalp electrodes positioned according to the 10-20 International system of electrode placement. Electrical activity was reviewed with band pass filter of 1-70Hz , sensitivity of 7 uV/mm, display speed of 65mm/sec with a 60Hz  notched filter applied as appropriate. EEG data were recorded continuously and digitally stored.  Video monitoring was available and reviewed as appropriate.  Description: EEG showed continuous generalized polymorphic 5 to 6 Hz theta slowing admixed with intermittent 2-3hz  delta slowing. Hyperventilation and photic stimulation were not performed.     ABNORMALITY - Continuous slow, generalized  IMPRESSION: This study is suggestive of moderate diffuse encephalopathy. No seizures or epileptiform discharges were seen throughout the recording.  [name] [name]

## 2023-07-19 NOTE — Progress Notes (Signed)
07/19/23 01:04 Patient's telemetry monitor by the Nurse's station rang asystole so this RN immediately went to patient's room and found patient unresponsive and pulseless. Code blue was called. CPR started.

## 2023-07-19 NOTE — Inpatient Diabetes Management (Signed)
Inpatient Diabetes Program Recommendations  AACE/ADA: New Consensus Statement on Inpatient Glycemic Control (2015)  Target Ranges:  Prepandial:   less than 140 mg/dL      Peak postprandial:   less than 180 mg/dL (1-2 hours)      Critically ill patients:  140 - 180 mg/dL   Lab Results  Component Value Date   GLUCAP 109 (H) 07/19/2023   HGBA1C >15.5 (H) 05/30/2023    Review of Glycemic Control  Latest Reference Range & Units 07/19/23 04:41 07/19/23 06:32 07/19/23 07:41  Glucose-Capillary 70 - 99 mg/dL 161 (H) 096 (H) 045 (H)   Diabetes history: DM 1 Outpatient Diabetes medications:  Lantus 3 units daily,  Humalog 0-6 units tid with meals  Current orders for Inpatient glycemic control:  Novolog 0-9 units q 6 hours prn   Inpatient Diabetes Program Recommendations:    Recommend reducing Novolog 0-6 units q 6 hours.    Thanks,  Lorenza Cambridge, RN, BC-ADM Inpatient Diabetes Coordinator Pager [phone]  (8a-5p)

## 2023-07-19 NOTE — Progress Notes (Signed)
   07/19/23 0105  Spiritual Encounters  Type of Visit Initial  Care provided to: Pt not available  Referral source Code page  Reason for visit Code  OnCall Visit No   Chaplain responded to a code blue. Patient attended to by the medical team.  No family is present. If a chaplain is requested someone will respond.   Valerie Roys [facility]  [phone]

## 2023-07-19 NOTE — Progress Notes (Addendum)
[facility] Progress Note   Subjective: Net UF 4 liters in HD 0217/2025 however early AM she was found pulseless, unresponsive. Code blue called. She was intubated, ROSC after 5 minutes. Now intubated, sedated on ventilator.   Objective Vitals:   07/19/23 0645 07/19/23 0700 07/19/23 0800 07/19/23 0900  BP: 120/74 124/77 126/65   Pulse: 87 89 97   Resp: 15 16 20    Temp: (!) 96.8 F (36 C) (!) 97 F (36.1 C) 97.9 F (36.6 C) 97.9 F (36.6 C)  TempSrc:      SpO2: 100% 100% 100%   Weight:      Height:       Physical Exam General: Chronically ill appearing female in NAD Heart: S1,S2 RRR No M/R/G Lungs: Orally intubated with bilateral breath sound symmetrical chest excursions. Decreased in bases otherwise CTAB.  Abdomen: NABS + Ascites Extremities: 2+ BLE pitting edema Dialysis Access: AVG + T/B  Additional Objective Labs: Basic Metabolic Panel: Recent Labs  Lab 07/17/23 0848 07/18/23 1408 07/19/23 0218 07/19/23 0348  NA 129* 124* 128* 128*  K 3.6 4.3 4.3 4.0  CL 90* 89* 90*  --   CO2 25 25 23   --   GLUCOSE 94 173* 148*  --   BUN 19 31* 22*  --   CREATININE 3.19* 4.09* 3.17*  --   CALCIUM 8.0* 7.5* 7.9*  --   PHOS 2.2* 3.6 3.4  --    Liver Function Tests: Recent Labs  Lab 07/16/23 1206 07/17/23 0848 07/18/23 1408 07/19/23 0215 07/19/23 0218  AST 26 30  --  34  --   ALT 14 15  --  12  --   ALKPHOS 147* 141*  --  140*  --   BILITOT 1.4* 2.4*  --  3.0*  --   PROT 6.0* 6.4*  --  6.5  --   ALBUMIN 2.4* 2.3* 2.1* 2.4* 2.3*   No results for input(s): "LIPASE", "AMYLASE" in the last 168 hours. CBC: Recent Labs  Lab 07/16/23 1206 07/16/23 1214 07/17/23 0848 07/18/23 1408 07/19/23 0216 07/19/23 0348  WBC 2.6*  --  3.4* 7.0 5.6  --   NEUTROABS 1.6*  --   --   --   --   --   HGB 12.1   < > 10.2* 9.3* 10.3* 11.6*  HCT 39.9   < > 31.1* 28.5* 32.3* 34.0*  MCV 95.0  --  88.9 89.6 91.0  --   PLT 101*  --  63* 44* 37*  --    < > = values in this  interval not displayed.   Blood Culture    Component Value Date/Time   SDES BLOOD RIGHT WRIST 07/16/2023 1159   SPECREQUEST  07/16/2023 1159    BOTTLES DRAWN AEROBIC ONLY Blood Culture results may not be optimal due to an inadequate volume of blood received in culture bottles   CULT  07/16/2023 1159    NO GROWTH 3 DAYS Performed at [facility], [address] [place], [place] [address]    REPTSTATUS PENDING 07/16/2023 1159    Cardiac Enzymes: Recent Labs  Lab 07/16/23 1206  CKTOTAL 95   CBG: Recent Labs  Lab 07/19/23 0129 07/19/23 0218 07/19/23 0441 07/19/23 0632 07/19/23 0741  GLUCAP 60* 119* 112* 118* 109*   Iron Studies: No results for input(s): "IRON", "TIBC", "TRANSFERRIN", "FERRITIN" in the last 72 hours. @lablastinr3 @ Studies/Results: ECHOCARDIOGRAM COMPLETE Result Date: 07/19/2023    ECHOCARDIOGRAM REPORT   Patient Name:  Ann Robertson Date of Exam: 07/19/2023 Medical Rec #:  [id number]               Height:       65.0 in Accession #:    [id number]              Weight:       123.5 lb Date of Birth:  23-Mar-1991               BSA:          1.611 m Patient Age:    32 years                BP:           124/77 mmHg Patient Gender: F                       HR:           109 bpm. Exam Location:  Inpatient Procedure: 2D Echo, 3D Echo, Cardiac Doppler, Color Doppler and Strain Analysis            (Both Spectral and Color Flow Doppler were utilized during            procedure). Indications:    cardiac arrest  History:        Patient has no prior history of Echocardiogram examinations.                 Cardiac arrest; Risk Factors:Diabetes, Hypertension and                 Dyslipidemia.  Sonographer:    Dondra Prader RVT RCS Referring Phys: Elenore Paddy REESE IMPRESSIONS  1. Papillary muscle hypertrophy. Left ventricular ejection fraction, by estimation, is 45 to 50%. Left ventricular ejection fraction by 3D volume is 44 %. The left ventricle has mildly decreased function.  The left ventricle demonstrates global hypokinesis. There is mild concentric left ventricular hypertrophy. Left ventricular diastolic parameters are indeterminate. There is abnormal (paradoxical) septal motion, consistent with right ventricular volume overload. The average left ventricular global longitudinal strain is -11.5 %. The global longitudinal strain is abnormal.  2. Right ventricular systolic function is moderately reduced. The right ventricular size is severely enlarged.  3. A small pericardial effusion is present. The pericardial effusion is posterior to the left ventricle. There is no evidence of cardiac tamponade.  4. The mitral valve is normal in structure. No evidence of mitral valve regurgitation. No evidence of mitral stenosis.  5. Tricuspid valve regurgitation is severe.  6. The aortic valve is tricuspid. Aortic valve regurgitation is not visualized. No aortic stenosis is present. Comparison(s): Prior images reviewed side by side. Pericardial effusion has increased. Ventricular function has decreased. IVC not well visualized for RVSP comparison. Tricuspid regurgitation is significant. FINDINGS  Left Ventricle: Papillary muscle hypertrophy. Left ventricular ejection fraction, by estimation, is 45 to 50%. Left ventricular ejection fraction by 3D volume is 44 %. The left ventricle has mildly decreased function. The left ventricle demonstrates global hypokinesis. The average left ventricular global longitudinal strain is -11.5 %. Strain was performed and the global longitudinal strain is abnormal. The left ventricular internal cavity size was normal in size. There is mild concentric left ventricular hypertrophy. Abnormal (paradoxical) septal motion, consistent with right ventricular volume overload. Left ventricular diastolic parameters are indeterminate. Right Ventricle: The right ventricular size is severely enlarged. No increase in right ventricular wall thickness. Right ventricular systolic  function is moderately reduced.  Left Atrium: Left atrial size was not well visualized. Right Atrium: Right atrial size was normal in size. Pericardium: A small pericardial effusion is present. The pericardial effusion is posterior to the left ventricle. There is no evidence of cardiac tamponade. Mitral Valve: The mitral valve is normal in structure. No evidence of mitral valve regurgitation. No evidence of mitral valve stenosis. Tricuspid Valve: The tricuspid valve is normal in structure. Tricuspid valve regurgitation is severe. Aortic Valve: The aortic valve is tricuspid. Aortic valve regurgitation is not visualized. No aortic stenosis is present. Aortic valve mean gradient measures 3.0 mmHg. Aortic valve peak gradient measures 5.9 mmHg. Aortic valve area, by VTI measures 2.04 cm. Pulmonic Valve: The pulmonic valve was grossly normal. Pulmonic valve regurgitation is not visualized. No evidence of pulmonic stenosis. Aorta: The aortic root and ascending aorta are structurally normal, with no evidence of dilitation. IAS/Shunts: The atrial septum is grossly normal. Additional Comments: 3D was performed not requiring image post processing on an independent workstation and was abnormal.  LEFT VENTRICLE PLAX 2D LVIDd:         3.90 cm         Diastology LVIDs:         3.00 cm         LV e' medial:    8.18 cm/s LV PW:         1.10 cm         LV E/e' medial:  10.8 LV IVS:        1.10 cm         LV e' lateral:   10.30 cm/s LVOT diam:     1.90 cm         LV E/e' lateral: 8.6 LV SV:         38 LV SV Index:   24              2D LVOT Area:     2.84 cm        Longitudinal                                Strain                                2D Strain GLS  -11.7 %                                (A2C):                                2D Strain GLS  -12.1 %                                (A3C):                                2D Strain GLS  -10.6 %                                (A4C):  2D Strain GLS   -11.5 %                                Avg:                                 3D Volume EF                                LV 3D EF:    Left                                             ventricul                                             ar                                             ejection                                             fraction                                             by 3D                                             volume is                                             44 %.                                 3D Volume EF:                                3D EF:        44 %                                LV EDV:       126 ml                                LV ESV:       71 ml  LV SV:        55 ml RIGHT VENTRICLE RV Basal diam:  4.20 cm RV Mid diam:    3.50 cm RV S prime:     10.90 cm/s TAPSE (M-mode): 1.2 cm LEFT ATRIUM             Index        RIGHT ATRIUM           Index LA diam:        3.00 cm 1.86 cm/m   RA Area:     17.50 cm LA Vol (A2C):   23.1 ml 14.34 ml/m  RA Volume:   46.80 ml  29.04 ml/m LA Vol (A4C):   35.9 ml 22.28 ml/m LA Biplane Vol: 29.6 ml 18.37 ml/m  AORTIC VALVE                    PULMONIC VALVE AV Area (Vmax):    1.95 cm     PV Vmax:          0.91 m/s AV Area (Vmean):   1.92 cm     PV Peak grad:     3.3 mmHg AV Area (VTI):     2.04 cm     PR End Diast Vel: 4.58 msec AV Vmax:           121.00 cm/s AV Vmean:          81.500 cm/s AV VTI:            0.188 m AV Peak Grad:      5.9 mmHg AV Mean Grad:      3.0 mmHg LVOT Vmax:         83.30 cm/s LVOT Vmean:        55.100 cm/s LVOT VTI:          0.135 m LVOT/AV VTI ratio: 0.72  AORTA Ao Root diam: 2.80 cm Ao Asc diam:  2.30 cm MITRAL VALVE               TRICUSPID VALVE MV Area (PHT): 5.88 cm    TR Peak grad:   24.4 mmHg MV Decel Time: 129 msec    TR Vmax:        247.00 cm/s MV E velocity: 88.50 cm/s MV A velocity: 77.00 cm/s  SHUNTS MV E/A ratio:  1.15        Systemic VTI:  0.14 m                             Systemic Diam: 1.90 cm Riley Lam MD Electronically signed by Riley Lam MD Signature Date/Time: 07/19/2023/10:03:58 AM    Final    EEG adult Result Date: 07/19/2023 Charlsie Quest, MD     07/19/2023 10:09 AM Patient Name: Ann Robertson MRN: [id number] Epilepsy Attending: Charlsie Quest Referring Physician/Provider: Tim Lair, PA-C Date: 07/19/2023 Duration: 23 mins Patient history: 33yo F s/p cardiac arrest. EEG to evaluate for seizure Level of alertness: lethargic AEDs during EEG study: propofol Technical aspects: This EEG study was done with scalp electrodes positioned according to the 10-20 International system of electrode placement. Electrical activity was reviewed with band pass filter of 1-70Hz , sensitivity of 7 uV/mm, display speed of 66mm/sec with a 60Hz  notched filter applied as appropriate. EEG data were recorded continuously and digitally stored.  Video monitoring was available and reviewed as appropriate. Description: EEG showed continuous generalized polymorphic 5 to  6 Hz theta slowing admixed with intermittent 2-3hz  delta slowing. Hyperventilation and photic stimulation were not performed.   ABNORMALITY - Continuous slow, generalized IMPRESSION: This study is suggestive of moderate diffuse encephalopathy. No seizures or epileptiform discharges were seen throughout the recording. Charlsie Quest   DG Abdomen 1 View Result Date: 07/19/2023 CLINICAL DATA:  Orogastric tube placement EXAM: ABDOMEN - 1 VIEW COMPARISON:  06/12/2023 FINDINGS: Nasogastric tube tip overlies the expected gastric antrum. The abdominal gas pattern is indeterminate due to a paucity of intra-abdominal gas. Loss of the properitoneal stripe suggests underlying ascites. IMPRESSION: 1. Nasogastric tube tip overlies the expected gastric antrum. 2. Indeterminate gas pattern. 3. Suspected ascites Electronically Signed   By: Helyn Numbers M.D.   On: 07/19/2023 02:23   DG CHEST PORT 1  VIEW Result Date: 07/19/2023 CLINICAL DATA:  Respiratory failure EXAM: PORTABLE CHEST 1 VIEW COMPARISON:  07/16/2023 FINDINGS: Endotracheal tube 16 mm above the carina. Nasogastric tube extends into the upper abdomen beyond margin of the examination. Lung volumes are small there is bibasilar atelectasis. Small right pleural effusion is present. No pneumothorax. Cardiac size within normal limits. Pulmonary vascularity is normal. No acute bone. IMPRESSION: 1. Support tubes in appropriate position. 2. Small lung volumes with bibasilar atelectasis. Small right pleural effusion. Electronically Signed   By: Helyn Numbers M.D.   On: 07/19/2023 02:22   CT Head Wo Contrast Result Date: 07/17/2023 CLINICAL DATA:  Initial evaluation for acute mental status change, unknown cause. EXAM: CT HEAD WITHOUT CONTRAST TECHNIQUE: Contiguous axial images were obtained from the base of the skull through the vertex without intravenous contrast. RADIATION DOSE REDUCTION: This exam was performed according to the departmental dose-optimization program which includes automated exposure control, adjustment of the mA and/or kV according to patient size and/or use of iterative reconstruction technique. COMPARISON:  Prior MRI from 06/15/2023. FINDINGS: Brain: Cerebral volume within normal limits. No acute intracranial hemorrhage. No acute large vessel territory infarct. No mass lesion, mass effect, or midline shift. No hydrocephalus or extra-axial fluid collection. Vascular: No abnormal hyperdense vessel. Scattered calcified atherosclerosis present about the skull base, somewhat advanced for age. Skull: Scalp soft tissues within normal limits.  Calvarium intact. Sinuses/Orbits: Globes and orbital soft tissues within normal limits. Visualized paranasal sinuses are largely clear. Small to moderate right mastoid effusion noted. Left mastoid air cells are clear. Other: None. IMPRESSION: 1. No acute intracranial abnormality. 2. Small to moderate  right mastoid effusion, of uncertain significance. Correlation with physical exam suggested. 3. Scattered calcified atherosclerosis about the skull base, advanced for age. Electronically Signed   By: Rise Mu M.D.   On: 07/17/2023 20:38   Medications:  fentaNYL infusion INTRAVENOUS 150 mcg/hr (07/19/23 0900)   magnesium sulfate Stopped (07/19/23 0504)   propofol (DIPRIVAN) infusion 10 mcg/kg/min (07/19/23 0941)   sodium chloride Stopped (07/16/23 1346)    acetaminophen  650 mg Oral Q4H   Or   acetaminophen (TYLENOL) oral liquid 160 mg/5 mL  650 mg Per Tube Q4H   Or   acetaminophen  650 mg Rectal Q4H   Chlorhexidine Gluconate Cloth  6 each Topical Q0600   docusate  100 mg Per Tube BID   doxycycline  100 mg Per Tube Q12H   fentaNYL (SUBLIMAZE) injection  50 mcg Intravenous Once   heparin  5,000 Units Subcutaneous Q8H   insulin aspart  0-6 Units Subcutaneous Q4H   levothyroxine  25 mcg Per Tube Q0600   lipase/protease/amylase  72,000 Units Oral TID WC  mouth rinse  15 mL Mouth Rinse Q2H   pantoprazole (PROTONIX) IV  40 mg Intravenous Daily   polyethylene glycol  17 g Per Tube Daily   rosuvastatin  10 mg Per Tube Daily   sodium chloride flush  3 mL Intravenous Q12H     Dialysis Orders: MWF-[facility] 4 hrs 180NRe 400/Autoflow 1.5 46.5 kg 2.0K/3.0 Ca AVG - Heparin 2500 units IV three times per week - Calcitriol 0.25 mcg PO three times per week - Mircera 120 mcg IV q 4 weeks  (last dose 75 mcg 07/10/2023) - Venofer 50 mg IV weeks (Just finished Fe Load 07/04/2023)   Assessment/Plan:  PEA Arrest: Intubated, management per PCCM Acute encephalopathy: PCCM, Neurology consulted.  Acute hypoxemic respiratory failure- management per PCCM.  Volume overload - w/ LE anasarca and distended 2-3+ ascites. Pt says w/ dialysis both will improve. CXR showed R pleural effusion and atelectasis.  She reports missing HD 2nd childcare issues. Completed HD 2/16 early  morning and 5L was removed. HD today, max UF. She remains 9.3 kg above OP EDW. Continue to lower volume as tolerate. Do not think she requires CRRT at present.  Hypoglycemia - per pmd ESRD - on HD MWF. Last OP HD 2/12. Completed HD 2/16 early am. Next HD 07/19/2023.  HTN - I changed low-dose Amlodipine to at bedtime and set parameters to hold AM Labetalol dose on dialysis days. 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 ESA not due yet.   Secondary hyperparathyroidism - CCa in range and phos is low, hold binders for now.  DMT1 - hear w/ hypoglycemia, per pmd    Kamyra Schroeck H. Keandrea Tapley NP-C 07/19/2023, 11:17 AM  [employer] [phone]

## 2023-07-19 NOTE — Progress Notes (Addendum)
Initial Nutrition Assessment  DOCUMENTATION CODES:   Severe malnutrition in context of chronic illness  INTERVENTION:  Initiate tube feeding via OGT: -Vital 1.5 @ 57ml/h AND HOLD  Once tolerance established: Increase by 46ml/hr Q12 hrs until goal rate of 45ml/h achieved -Vital 1.5 @ 19ml/h (1080 ml per day) -Prosource TF20 60 ml daily  Provides 1700 kcal, 93 gm protein, 825 ml free water daily   Monitor magnesium, potassium, and phosphorus daily for at least 5 days, MD to replete as needed Add Thiamine 100 mg daily for 7 days   NUTRITION DIAGNOSIS:  Severe Malnutrition related to chronic illness as evidenced by severe muscle depletion, severe fat depletion, edema.  GOAL:  Patient will meet greater than or equal to 90% of their needs  MONITOR:  Diet advancement, Vent status, Labs, TF tolerance, I & O's  REASON FOR ASSESSMENT:  Consult Enteral/tube feeding initiation and management, Assessment of nutrition requirement/status  ASSESSMENT:   Patient initially presenting [facility] on 2/15 for AMS. On 2/18, she was noted unresponsive and pulseless requiring 5 minutes of CPR and Epi x2 before ROSC. PMH: HTN, HLD, T1DM, ESRD-HD, anemia, seizures, HSV2.  2/15 admitted for AMS/hypoglycemia 2/16 dialysis UF 5L 2/17 dialysis UF 3L 2/18 PEA, intubated, txr 2H  She is not currently requiring pressor support, but is sedated. Spoke with attending today. TFs to be started at trickles. Will assess for ability to advance thereafter. She is at elevated risk for intolerance and refeeding due to significant ascites, poor reported PO intake, as well as degree of malnourishment.   Patient is currently intubated on ventilator support MV: 9 L/min Temp (24hrs), Avg:96.6 F (35.9 C), Min:94.6 F (34.8 C), Max:98.5 F (36.9 C)  Propofol: 3.36 ml/hr   Per chart review, pt son called EMS endorsing AMS s/p poor PO intake for multiple days. This is her third hospitalization since late December d/t  blood sugar-related issues. Initially admitted with hypoglycemia, which had been resolved with use of dextrose infusion.  Received inpatient dialysis 2/16 where 5L was removed. Treated again on 2/17 with UF documented as 4L. Unable to obtain UBW, however chart review showed body weight of 61.2kg one year ago. This is 8.4% weight loss, which is not considered significant. Of note, fluid overload could be masking significant wt loss trend.   Admit Weight: 60.4kg Current Weight: 56kg EDW: 46.5kg  Observed with significant amount of ascites and BLE edema, which is likely masking additional muscle/fat wasting.   Intake/Output Summary (Last 24 hours) at 07/19/2023 1458 Last data filed at 07/19/2023 1000 Gross per 24 hour  Intake 368.54 ml  Output 4050 ml  Net -3681.46 ml    Net IO Since Admission: -4,746.15 mL [07/19/23 1458]   Drain/Lines: OGT L AVG  Meds: docusate, doxycycline, levothyroxine, Creon, pantoprazole, Miralax,  Drips: 1g Ca Gluconate x1 2g Mg Sulfate x 1 Fentanyl @ 131mcg/hr Proprofol @ 3.39mL/hr  Labs:  Na+ 124>128 (L)  K+ 4.0 (wdl)  PHOS 2.2>3.6>3.4 (wdl)  Lactic Acid 2.0>1.6>3.4 (H) CBGs 148-173 x48 hours  A1c >15.5 (05/2023)    NUTRITION - FOCUSED PHYSICAL EXAM:  Flowsheet Row Most Recent Value  Orbital Region Moderate depletion  Upper Arm Region Severe depletion  Thoracic and Lumbar Region Unable to assess  [significant ascites]  Buccal Region Severe depletion  Temple Region Severe depletion  Clavicle Bone Region Severe depletion  Clavicle and Acromion Bone Region Severe depletion  Scapular Bone Region Severe depletion  Dorsal Hand Unable to assess  [edematous]  Patellar Region  Severe depletion  Anterior Thigh Region Severe depletion  Posterior Calf Region Unable to assess  [significant edema to BLEs]  Edema (RD Assessment) Severe  Hair Reviewed  Eyes Unable to assess  Mouth Reviewed  Skin Reviewed  Nails Reviewed   Diet Order:   Diet Order              Diet NPO time specified  Diet effective now             EDUCATION NEEDS:   Not appropriate for education at this time  Skin:  Skin Assessment: Reviewed RN Assessment  Last BM:  2/16 - type 7  Height:  Ht Readings from Last 1 Encounters:  07/17/23 5\' 5"  (1.651 m)   Weight:  Wt Readings from Last 1 Encounters:  07/19/23 56 kg    Ideal Body Weight:  56.8 kg  BMI:  Body mass index is 20.54 kg/m.  Estimated Nutritional Needs:   Kcal:  1600-1800kcal  Protein:  85-95g  Fluid:  1L + UOP  Myrtie Cruise MS, RD, LDN Registered Dietitian Clinical Nutrition RD Inpatient Contact Info in Amion

## 2023-07-19 NOTE — Plan of Care (Signed)
  Problem: Clinical Measurements: Goal: Ability to maintain clinical measurements within normal limits will improve Outcome: Progressing Goal: Will remain free from infection Outcome: Progressing Goal: Diagnostic test results will improve Outcome: Progressing Goal: Respiratory complications will improve Outcome: Progressing Goal: Cardiovascular complication will be avoided Outcome: Progressing   Problem: Nutrition: Goal: Adequate nutrition will be maintained Outcome: Progressing   Problem: Safety: Goal: Ability to remain free from injury will improve Outcome: Progressing   Problem: Skin Integrity: Goal: Risk for impaired skin integrity will decrease Outcome: Progressing   Problem: Fluid Volume: Goal: Ability to maintain a balanced intake and output will improve Outcome: Progressing   Problem: Metabolic: Goal: Ability to maintain appropriate glucose levels will improve Outcome: Progressing   Problem: Respiratory: Goal: Ability to maintain a clear airway and adequate ventilation will improve Outcome: Progressing

## 2023-07-19 NOTE — Progress Notes (Signed)
Right upper extremity venous duplex has been completed. Results can be found in chart review under CV Proc.  07/19/2023 3:15 PM  [employer], RVT.

## 2023-07-19 NOTE — Progress Notes (Addendum)
NAME:  Ann Robertson, MRN:  [id number], DOB:  [birth year], LOS: 2 ADMISSION DATE:  07/16/2023 CONSULTATION DATE:  07/19/2023 REFERRING MD:  Nelson Chimes - TRH, CHIEF COMPLAINT:  Post-cardiac arrest   History of Present Illness:  33 year old woman who initially presented to [facility] 2/15 for AMS. PMHx significant for HTN, HLD, T1DM (c/b DKA), ESRD (on HD MWF via LUE AVF), anemia, seizure, HSV2.  Per chart review, patient's son called EMS when she began acting abnormally; GCS was noted to be 4 with CBG 32. Patient's son endorses that she has had poor PO intake. Glucagon was administered with improvement in GCS to 9 and CBG to 50. Continued to be hypoglycemic in ED requiring D50 amps x 2 and subsequent D10 gtt initiation. Additional concern for possible sepsis with empiric broad-spectrum antibiotics started (vanc/cefepime). Nephrology was consulted for continuation of HD.  On 2/18AM, patient was noted to be unresponsive and pulseless with initial rhythm PEA. Code Blue was called and CPR administered x 5 minutes with Epi x 2 and ROSC. Patient was intubated peri-code. She was subsequently transferred to [facility] CVICU.  PCCM consulted for ICU transfer post-arrest.  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 Initial rhythm PEA. CPR x 5 minutes, Epi x 2 with ROSC.  Intubated peri-code. Transferred to 2H for further care, PCCM consulted for ICU management.  Interim History / Subjective:  This am opens eyes to voice but not following commands; on fent 100 On vent   Objective:  Blood pressure 124/77, pulse 89, temperature (!) 97 F (36.1 C), resp. rate 16, height 5\' 5"  (1.651 m), weight 56 kg, SpO2 100%.    Vent Mode: PRVC FiO2 (%):  [60 %-100 %] 60 % Set Rate:  [12 bmp-26 bmp] 12 bmp Vt Set:  [400 mL] 400 mL PEEP:  [5 cmH20] 5 cmH20   Intake/Output Summary (Last 24 hours) at 07/19/2023 4098 Last data filed at 07/19/2023 0700 Gross per 24 hour  Intake 247.86 ml  Output 4050 ml  Net -3802.14 ml   Filed Weights   07/18/23 1400 07/18/23 1756 07/19/23 0500  Weight: 59.8 kg 55.8 kg 56 kg   Physical Examination: General:  critically ill appearing on mech vent HEENT: MM pink/moist; ETT in place Neuro: sedate on 100 mcg fent; opens eyes to voice and moves extremities spontaneously; not following commands; PERRL CV: s1s2, RRR, no m/r/g PULM:  dim clear BS bilaterally; on mech vent PRVC GI: soft, bsx4 active  Extremities: warm/dry, no edema; LUE fistula; right hand swelling compared to left  Skin: no rashes or lesions   Resolved Hospital Problem List:    Assessment & Plan:  Post-cardiac arrest -2/17 unresponsive and pea arrest; ROSC 5 minutes Plan: -watch in ICU w/ continuous telemetry monitoring -trop 14 then 20 -trend  and replete electrolytes as needed -HD for volume removal -echo pending -EEG -TTM normothermia protocol in place  Acute encephalopathy, initially presumed metabolic, now s/p cardiac arrest History of seizure CT Head 2/16 NAICA, small to moderate R mastoid effusion. Plan: - Spot EEG - limit sedating meds - wean sedation for RASS 0 - If neurologic exam not improving 72H post-arrest, consider MRI Brain  Acute hypoxemic respiratory failure in the setting of cardiac arrest Plan: -attempt SBT vs. PSV trial today; will  likely hold on extubation given poor mental status -LTVV strategy with tidal volumes of 6-8 cc/kg ideal body weight -Wean PEEP/FiO2 for SpO2 >92% -VAP bundle in place -Daily SAT and SBT -PAD protocol in place -wean sedation for RASS goal 0 to -1 -Follow intermittent CXR and ABG PRN  T1DM with hypoglycemia Pancreatic insufficiency History of T1DM with multiple hypoglycemic events. Poor/inconsistent PO intake. Previously had DKA. Plan: -f/u c-peptide -cbg monitoring -creon on hold while npo  Possible BLE cellulitis? Plan: -started on doxy on 2/17 -trend wbc/fever curve  Right hand swelling Plan: -elevate -pulses present w/ doppler  -right ue [place]  Hypothyroidism Plan: -synthroid  HTN HLD Plan: -hold anti-htn meds -statin and asa  ESRD on HD MWF Oliguria/anuria Plan: -nephro following for HD needs -Trend BMP / urinary output -Replace electrolytes as indicated -Avoid nephrotoxic agents, ensure adequate renal perfusion  Anemia of chronic disease Thrombocytopenia, presume liver related? Plan: -trend cbc  Ascites Anasarca Malnutrition Small amount of pelvic free fluid noted as early as 10/2020, increasing volume of abdominopelvic ascites and anasarca since (noted to be massive ascites 05/2023). ? Nodular contour of liver noted on US Abdomen 01/2022, Bedside POCUS 2/18AM with significant abdominopelvic ascites, simple-appearing on [place].  Plan: -hd for volume removal -RD following for nutrition needs; TF today if not extubated   Best Practice: (right click and "Reselect all SmartList Selections" daily)   Diet/type: NPO; TF today if not extubated DVT prophylaxis: prophylactic heparin  GI prophylaxis: PPI Lines: N/A Foley:  N/A Code Status:  full code Last date of multidisciplinary goals of care discussion [2/18 updated sister Precious over phone ]  Labs:  CBC: Recent Labs  Lab 07/16/23 1206 07/16/23 1214 07/16/23 1404 07/17/23 0848 07/18/23 1408  07/19/23 0216 07/19/23 0348  WBC 2.6*  --   --  3.4* 7.0 5.6  --   NEUTROABS 1.6*  --   --   --   --   --   --   HGB 12.1   < > 10.9* 10.2* 9.3* 10.3* 11.6*  HCT 39.9   < > 32.0* 31.1* 28.5* 32.3* 34.0*  MCV 95.0  --   --  88.9 89.6 91.0  --   PLT 101*  --   --  63* 44* 37*  --    < > = values in this interval not displayed.   Basic Metabolic Panel: Recent Labs  Lab 07/16/23 1206 07/16/23 1214 07/16/23 1404 07/17/23 0848 07/18/23 1408 07/19/23 0216 07/19/23 0218 07/19/23 0348  NA 130* 130* 132* 129* 124*  --  128* 128*  K 5.1 5.2* 4.6 3.6 4.3  --  4.3 4.0  CL 96* 100  --  90* 89*  --  90*  --   CO2 21*  --   --  25 25  --  23  --   GLUCOSE 28* 25*  --  94 173*  --  148*  --   BUN 37* 40*  --  19 31*  --  22*  --  CREATININE 5.05* 5.50*  --  3.19* 4.09*  --  3.17*  --   CALCIUM 7.8*  --   --  8.0* 7.5*  --  7.9*  --   MG  --   --   --   --   --  1.8  --   --   PHOS  --   --   --  2.2* 3.6  --  3.4  --    GFR: Estimated Creatinine Clearance: 22.1 mL/min (A) (by C-G formula based on SCr of 3.17 mg/dL (H)). Recent Labs  Lab 07/16/23 1206 07/16/23 1214 07/16/23 1401 07/17/23 0848 07/18/23 1408 07/19/23 0216  WBC 2.6*  --   --  3.4* 7.0 5.6  LATICACIDVEN  --  2.0* 1.6  --   --  3.4*   Liver Function Tests: Recent Labs  Lab 07/16/23 1206 07/17/23 0848 07/18/23 1408 07/19/23 0215 07/19/23 0218  AST 26 30  --  34  --   ALT 14 15  --  12  --   ALKPHOS 147* 141*  --  140*  --   BILITOT 1.4* 2.4*  --  3.0*  --   PROT 6.0* 6.4*  --  6.5  --   ALBUMIN 2.4* 2.3* 2.1* 2.4* 2.3*   No results for input(s): "LIPASE", "AMYLASE" in the last 168 hours. No results for input(s): "AMMONIA" in the last 168 hours.  ABG:    Component Value Date/Time   PHART 7.532 (H) 07/19/2023 0348   PCO2ART 34.4 07/19/2023 0348   PO2ART 47 (L) 07/19/2023 0348   HCO3 29.4 (H) 07/19/2023 0348   TCO2 31 07/19/2023 0348   ACIDBASEDEF 2.0 07/16/2023 1404   O2SAT 90 07/19/2023 0348     Coagulation Profile: No results for input(s): "INR", "PROTIME" in the last 168 hours.  Cardiac Enzymes: Recent Labs  Lab 07/16/23 1206  CKTOTAL 95   HbA1C: HbA1c POC (<> result, manual entry)  Date/Time Value Ref Range Status  09/10/2020 01:35 PM >15 4.0 - 5.6 % Final   Hgb A1c MFr Bld  Date/Time Value Ref Range Status  05/30/2023 01:12 AM >15.5 (H) 4.8 - 5.6 % Final    Comment:    (NOTE)         Prediabetes: 5.7 - 6.4         Diabetes: >6.4         Glycemic control for adults with diabetes: <7.0   02/08/2022 09:41 PM 14.3 (H) 4.8 - 5.6 % Final    Comment:    (NOTE) Pre diabetes:          5.7%-6.4%  Diabetes:              >6.4%  Glycemic control for   <7.0% adults with diabetes    CBG: Recent Labs  Lab 07/18/23 2027 07/19/23 0129 07/19/23 0218 07/19/23 0441 07/19/23 0632  GLUCAP 91 60* 119* 112* 118*   Review of Systems:   Patient is encephalopathic and/or intubated; therefore, history has been obtained from chart review.   Past Medical History:  She,  has a past medical history of Acute metabolic encephalopathy (04/18/2021), DKA (diabetic ketoacidosis) (HCC) (06/04/2022), ESRD (end stage renal disease) (HCC), High cholesterol, History of pre-eclampsia in prior pregnancy, currently pregnant (07/14/2015), History of preterm delivery, currently pregnant (07/14/2015), HSV-2 infection, Juvenile diabetes (05/31/1998), Neuropathy, Preeclampsia, and STD (sexually transmitted disease).   Surgical History:   Past Surgical History:  Procedure Laterality Date   AV FISTULA PLACEMENT Left 06/19/2021  Procedure: INSERTION OF ARTERIOVENOUS (AV) GORE-TEX GRAFT ARM;  Surgeon: Chuck Hint, MD;  Location: [facility] OR;  Service: Vascular;  Laterality: Left;   CESAREAN SECTION     CESAREAN SECTION N/A 07/05/2013   Procedure: CESAREAN SECTION;  Surgeon: Allie Bossier, MD;  Location: [facility] ORS;  Service: Obstetrics;  Laterality: N/A;   CESAREAN SECTION N/A 07/25/2014   Procedure:  CESAREAN SECTION;  Surgeon: Reva Bores, MD;  Location: [facility] ORS;  Service: Obstetrics;  Laterality: N/A;   CESAREAN SECTION N/A 11/21/2015   Procedure: CESAREAN SECTION;  Surgeon: Reva Bores, MD;  Location: [facility] BIRTHING SUITES;  Service: Obstetrics;  Laterality: N/A;   IR FLUORO GUIDE CV LINE RIGHT  06/16/2021   IR US GUIDE VASC ACCESS RIGHT  06/16/2021   WISDOM TOOTH EXTRACTION     Social History:   reports that she has been smoking cigarettes. She has a 2.3 pack-year smoking history. She has never used smokeless tobacco. She reports that she does not currently use alcohol. She reports that she does not use drugs.   Family History:  Her family history includes Diabetes in her father; Heart disease in her mother; Hyperlipidemia in her father; Hypertension in her mother; Kidney disease in her mother; Stroke in her mother. There is no history of Seizures.   Allergies: Allergies  Allergen Reactions   Dilaudid [Hydromorphone] Itching and Rash   Home Medications: Prior to Admission medications   Medication Sig Start Date End Date Taking? Authorizing Provider  amLODipine (NORVASC) 5 MG tablet Take 1 tablet (5 mg total) by mouth daily. 06/18/23 07/18/23  Willeen Niece, MD  polycarbophil (FIBERCON) 625 MG tablet Take 1 tablet (625 mg total) by mouth 2 (two) times daily. Patient not taking: Reported on 06/13/2023 02/03/23   Carmina Miller, DO  gabapentin (NEURONTIN) 100 MG capsule Take 1 capsule (100 mg total) by mouth 3 (three) times daily as needed (neuropathy). 02/03/23   Carmina Miller, DO  hydrOXYzine (ATARAX) 25 MG tablet Take 25 mg by mouth daily as needed for anxiety.    [provider]  insulin glargine (LANTUS SOLOSTAR) 100 UNIT/ML Solostar Pen Inject 3 Units into the skin daily.    [provider]  insulin lispro (HUMALOG) 100 UNIT/ML KwikPen Inject 0-6 Units into the skin 3 (three) times daily with meals. Check Blood Glucose (BG) and inject per scale: BG <150= 0 unit; BG  150-200= 1 unit; BG 201-250= 2 unit; BG 251-300= 3 unit; BG 301-350= 4 unit; BG 351-400= 5 unit; BG >400= 6 unit and Call Primary Care. 02/03/23   Carmina Miller, DO  labetalol (NORMODYNE) 100 MG tablet Take 1 tablet (100 mg total) by mouth 2 (two) times daily. 06/17/23 07/17/23  Willeen Niece, MD  levothyroxine (SYNTHROID) 25 MCG tablet Take 1 tablet (25 mcg total) by mouth daily at 6 (six) AM. 06/18/23 07/18/23  Willeen Niece, MD  lidocaine-prilocaine (EMLA) cream Apply 1 Application topically as needed (For port access). 02/03/23   Carmina Miller, DO  lipase/protease/amylase (CREON) 36000 UNITS CPEP capsule Take 2 capsules (72,000 Units total) by mouth 3 (three) times daily with meals. May also take 1 capsule (36,000 Units total) as needed (with snacks - up to 4 snacks daily). 02/14/23   Carmina Miller, DO  loperamide (IMODIUM) 2 MG capsule Take 2 capsules (4 mg total) by mouth as needed for diarrhea or loose stools. Not to exceed 8 caps per day 02/03/23   Carmina Miller, DO  methocarbamol (ROBAXIN) 750 MG tablet Take 1  tablet (750 mg total) by mouth every 8 (eight) hours as needed for muscle spasms. 02/03/23   Carmina Miller, DO  ondansetron (ZOFRAN) 4 MG tablet Take 1 tablet (4 mg total) by mouth every 8 (eight) hours as needed for nausea or vomiting. 06/02/23   Tyrone Nine, MD  oxyCODONE-acetaminophen (PERCOCET) 10-325 MG tablet Take 1 tablet by mouth every 6 (six) hours as needed for pain.    [provider]  pantoprazole (PROTONIX) 40 MG tablet Take 1 tablet (40 mg total) by mouth daily. 06/02/23   Tyrone Nine, MD  rosuvastatin (CRESTOR) 10 MG tablet Take 1 tablet (10 mg total) by mouth daily. 02/04/23   Carmina Miller, DO    Critical care time: 40 minutes    Lidia Collum, PA-C [facility] 07/19/23 7:06 AM  Please see [email or web address] for pager details.  From 7A-7P if no response, please call [phone] After hours, please call ELink [phone]

## 2023-07-19 NOTE — Progress Notes (Signed)
  Patient Name: Ann Robertson   MRN: [id number]   Date of Birth/ Sex: 1990-11-24 , female      Admission Date: 07/16/2023  Attending Provider: Arnetha Courser, MD  Primary Diagnosis: Acute encephalopathy   Indication: Pt was in her usual state of health until this AM, when she was noted to be unresponsive in PEA arrest. Code blue was subsequently called. At the time of arrival on scene, ACLS protocol was underway.   Technical Description:  - CPR performance duration:  5 minutes  - Was defibrillation or cardioversion used? No   - Was external pacer placed? No  - Was patient intubated pre/post CPR? Yes   Medications Administered: Y = Yes; Blank = No Amiodarone    Atropine    Calcium    Epinephrine  Y  Lidocaine    Magnesium    Norepinephrine    Phenylephrine    Sodium bicarbonate    Vasopressin     Post CPR evaluation:  - Final Status - Was patient successfully resuscitated ? Yes - What is current rhythm? Sinus tach - What is current hemodynamic status? tenuous  Miscellaneous Information:  - Labs sent, including: CBC, RFP, Troponin, lactic, Mg  - Primary team notified?  Yes  - Family Notified? Yes        Katheran James, DO  07/19/2023, 1:24 AM

## 2023-07-19 NOTE — Progress Notes (Addendum)
eLink Physician-Brief Progress Note Patient Name: Ann Robertson DOB: 09-18-1990 MRN: [id number]   Date of Service  07/19/2023  HPI/Events of Note  32/F with ESRD on HD, transferred from the floors after code blue was called when the patient was noted to be in PEA arrest.  Total down time of 5 minutes.  Pt intubated. Pt was hypoglycemic and given 1amp of D50.   BP 130/91, HR 95, RR 16, O2 sats 100%  eICU Interventions  Check CBC, BMP, lactate, ABG and CXR.  Discontinue amlodipine and labetalol for now.  Check POC glucose qh4rs.   Pt is on fentanyl gtt.  She was responsive to noxious stimuli.  Pantoprazole for GI prophylaxis.  Heparin for DVT prophylaxis.      Intervention Category Evaluation Type: New Patient Evaluation  Larinda Buttery 07/19/2023, 2:04 AM  4:15 AM Ionized calcium at 1.05, Mg 1.8, crea 3.17.  VBG 7.532/34.4/47. Pt is saturating at 100% on 60% FiO2.  Plan> Replete calcium and Mg.  Decrease RR down to 12.

## 2023-07-20 ENCOUNTER — Inpatient Hospital Stay (HOSPITAL_COMMUNITY): Payer: 59

## 2023-07-20 DIAGNOSIS — J9601 Acute respiratory failure with hypoxia: Secondary | ICD-10-CM | POA: Diagnosis not present

## 2023-07-20 DIAGNOSIS — G9340 Encephalopathy, unspecified: Secondary | ICD-10-CM | POA: Diagnosis not present

## 2023-07-20 DIAGNOSIS — E109 Type 1 diabetes mellitus without complications: Secondary | ICD-10-CM | POA: Diagnosis not present

## 2023-07-20 DIAGNOSIS — M7989 Other specified soft tissue disorders: Secondary | ICD-10-CM | POA: Diagnosis not present

## 2023-07-20 DIAGNOSIS — I469 Cardiac arrest, cause unspecified: Secondary | ICD-10-CM | POA: Diagnosis not present

## 2023-07-20 LAB — GLUCOSE, CAPILLARY
Glucose-Capillary: 114 mg/dL — ABNORMAL HIGH (ref 70–99)
Glucose-Capillary: 118 mg/dL — ABNORMAL HIGH (ref 70–99)
Glucose-Capillary: 134 mg/dL — ABNORMAL HIGH (ref 70–99)
Glucose-Capillary: 189 mg/dL — ABNORMAL HIGH (ref 70–99)

## 2023-07-20 LAB — CBC
HCT: 30.3 % — ABNORMAL LOW (ref 36.0–46.0)
HCT: 28.9 % — ABNORMAL LOW (ref 36.0–46.0)
Hemoglobin: 9.9 g/dL — ABNORMAL LOW (ref 12.0–15.0)
Hemoglobin: 9.3 g/dL — ABNORMAL LOW (ref 12.0–15.0)
MCH: 28.8 pg (ref 26.0–34.0)
MCH: 28.5 pg (ref 26.0–34.0)
MCHC: 32.7 g/dL (ref 30.0–36.0)
MCHC: 32.2 g/dL (ref 30.0–36.0)
MCV: 88.1 fL (ref 80.0–100.0)
MCV: 88.7 fL (ref 80.0–100.0)
Platelets: 35 10*3/uL — ABNORMAL LOW (ref 150–400)
Platelets: 37 10*3/uL — ABNORMAL LOW (ref 150–400)
RBC: 3.44 MIL/uL — ABNORMAL LOW (ref 3.87–5.11)
RBC: 3.26 MIL/uL — ABNORMAL LOW (ref 3.87–5.11)
RDW: 17.5 % — ABNORMAL HIGH (ref 11.5–15.5)
RDW: 17.5 % — ABNORMAL HIGH (ref 11.5–15.5)
WBC: 6.1 10*3/uL (ref 4.0–10.5)
WBC: 5.8 10*3/uL (ref 4.0–10.5)
nRBC: 0 % (ref 0.0–0.2)
nRBC: 0 % (ref 0.0–0.2)

## 2023-07-20 LAB — RENAL FUNCTION PANEL
Albumin: 2 g/dL — ABNORMAL LOW (ref 3.5–5.0)
Anion gap: 13 (ref 5–15)
BUN: 33 mg/dL — ABNORMAL HIGH (ref 6–20)
CO2: 25 mmol/L (ref 22–32)
Calcium: 7.6 mg/dL — ABNORMAL LOW (ref 8.9–10.3)
Chloride: 88 mmol/L — ABNORMAL LOW (ref 98–111)
Creatinine, Ser: 3.67 mg/dL — ABNORMAL HIGH (ref 0.44–1.00)
GFR, Estimated: 16 mL/min — ABNORMAL LOW (ref >60)
Glucose, Bld: 192 mg/dL — ABNORMAL HIGH (ref 70–99)
Phosphorus: 3.5 mg/dL (ref 2.5–4.6)
Potassium: 4.1 mmol/L (ref 3.5–5.1)
Sodium: 126 mmol/L — ABNORMAL LOW (ref 135–145)

## 2023-07-20 LAB — MAGNESIUM: Magnesium: 2.2 mg/dL (ref 1.7–2.4)

## 2023-07-20 LAB — PHOSPHORUS: Phosphorus: 3.5 mg/dL (ref 2.5–4.6)

## 2023-07-20 LAB — TRIGLYCERIDES: Triglycerides: 41 mg/dL (ref <150)

## 2023-07-20 LAB — POTASSIUM: Potassium: 4.1 mmol/L (ref 3.5–5.1)

## 2023-07-20 LAB — C-PEPTIDE: C-Peptide: <0.1 ng/mL — ABNORMAL LOW (ref 1.1–4.4)

## 2023-07-20 MED ORDER — EPINEPHRINE 1 MG/10ML IJ SOSY
PREFILLED_SYRINGE | INTRAMUSCULAR | Status: AC | PRN
Start: 2023-07-20 — End: 2023-07-20
  Administered 2023-07-20: 1 mg via INTRAVENOUS

## 2023-07-20 MED ORDER — LIDOCAINE-PRILOCAINE 2.5-2.5 % EX CREA: 1.0000 | TOPICAL_CREAM | CUTANEOUS | Status: DC | PRN

## 2023-07-20 MED ORDER — EPINEPHRINE HCL 5 MG/250ML IV SOLN IN NS
INTRAVENOUS | Status: AC
Start: 2023-07-20 — End: 2023-07-20
  Filled 2023-07-20: qty 250

## 2023-07-20 MED ORDER — LIDOCAINE HCL (PF) 1 % IJ SOLN: 5.0000 mL | INTRAMUSCULAR | Status: DC | PRN

## 2023-07-20 MED ORDER — INSULIN ASPART 100 UNIT/ML IJ SOLN: 0.0000 [IU] | Freq: Four times a day (QID) | INTRAMUSCULAR | Status: DC

## 2023-07-20 MED ORDER — NOREPINEPHRINE 4 MG/250ML-% IV SOLN: 0.0000 ug/min | INTRAVENOUS | Status: DC

## 2023-07-20 MED ORDER — GLYCOPYRROLATE 1 MG PO TABS: 1.0000 mg | ORAL_TABLET | ORAL | Status: DC | PRN

## 2023-07-20 MED ORDER — EPINEPHRINE HCL 5 MG/250ML IV SOLN IN NS
0.5000 ug/min | INTRAVENOUS | Status: DC
  Filled 2023-07-20: qty 250

## 2023-07-20 MED ORDER — ALBUMIN HUMAN 25 % IV SOLN
25.0000 g | Freq: Four times a day (QID) | INTRAVENOUS | Status: DC
  Administered 2023-07-20: 25 g via INTRAVENOUS
  Filled 2023-07-20: qty 100

## 2023-07-20 MED ORDER — NOREPINEPHRINE 4 MG/250ML-% IV SOLN
0.0000 ug/min | INTRAVENOUS | Status: DC
Start: 2023-07-20 — End: 2023-07-21

## 2023-07-20 MED ORDER — PHENYLEPHRINE 80 MCG/ML (10ML) SYRINGE FOR IV PUSH (FOR BLOOD PRESSURE SUPPORT)
PREFILLED_SYRINGE | INTRAVENOUS | Status: AC
Start: 2023-07-20 — End: 2023-07-20
  Filled 2023-07-20: qty 10

## 2023-07-20 MED ORDER — PENTAFLUOROPROP-TETRAFLUOROETH EX AERO: 1.0000 | INHALATION_SPRAY | CUTANEOUS | Status: DC | PRN

## 2023-07-20 MED ORDER — NOREPINEPHRINE 16 MG/250ML-% IV SOLN
0.0000 ug/min | INTRAVENOUS | Status: DC
  Filled 2023-07-20: qty 250

## 2023-07-20 MED ORDER — GLYCOPYRROLATE 0.2 MG/ML IJ SOLN: 0.2000 mg | INTRAMUSCULAR | Status: DC | PRN

## 2023-07-20 MED ORDER — ANTICOAGULANT SODIUM CITRATE 4% (200MG/5ML) IV SOLN: 5.0000 mL | Status: DC | PRN

## 2023-07-20 MED ORDER — EPINEPHRINE HCL 5 MG/250ML IV SOLN IN NS
0.5000 ug/min | INTRAVENOUS | Status: DC
Start: 2023-07-20 — End: 2023-07-20

## 2023-07-20 MED ORDER — CALCIUM CHLORIDE 10 % IV SOLN
INTRAVENOUS | Status: AC | PRN
  Administered 2023-07-20: 1 g via INTRAVENOUS

## 2023-07-20 MED ORDER — SODIUM BICARBONATE 8.4 % IV SOLN
INTRAVENOUS | Status: AC | PRN
  Administered 2023-07-20: 50 meq via INTRAVENOUS

## 2023-07-20 MED ORDER — HEPARIN SODIUM (PORCINE) 1000 UNIT/ML DIALYSIS: 1000.0000 [IU] | INTRAMUSCULAR | Status: DC | PRN

## 2023-07-20 MED ORDER — GLYCOPYRROLATE 0.2 MG/ML IJ SOLN
0.2000 mg | INTRAMUSCULAR | Status: DC | PRN
  Administered 2023-07-21: 0.2 mg via INTRAVENOUS
  Filled 2023-07-20: qty 1

## 2023-07-20 MED ORDER — HEPARIN SODIUM (PORCINE) 1000 UNIT/ML DIALYSIS
2500.0000 [IU] | Freq: Once | INTRAMUSCULAR | Status: AC
  Administered 2023-07-20: 2500 [IU] via INTRAVENOUS_CENTRAL
  Filled 2023-07-20: qty 3

## 2023-07-20 MED ORDER — ALTEPLASE 2 MG IJ SOLR: 2.0000 mg | Freq: Once | INTRAMUSCULAR | Status: DC | PRN

## 2023-07-20 MED ORDER — POLYVINYL ALCOHOL 1.4 % OP SOLN: 1.0000 [drp] | Freq: Four times a day (QID) | OPHTHALMIC | Status: DC | PRN

## 2023-07-20 NOTE — IPAL (Signed)
  Interdisciplinary Goals of Care Family Meeting   Date carried out: 07/20/2023  Location of the meeting: Bedside  Member's involved: Nurse Practitioner, Bedside Registered Nurse, Chaplain, and Family Member or next of kin  Durable Power of Attorney or acting medical decision maker: Father Carlise Stofer and sister Precious   Discussion: I spoke to Father, sister, and son at bedside regarding multisystem organ failure with recurrent cardiac arrest. I recommended transitioning to comfort care. The family wished to discuss options among themselves regarding code status.   Code status:   Code Status: Full Code   Disposition: Continue current acute care  Time spent for the meeting: 35 mins    Keelan Tripodi D. Harris, NP-C [facility] Personal contact information can be found on Amion  If no contact or response made please call [phone] 07/20/2023, 12:09 PM

## 2023-07-20 NOTE — Consult Note (Signed)
WOC Nurse Consult Note: Reason for Consult: blister R wrist  Wound type: Blood filled blister R wrist indicating deep tissue pressure injury  Pressure Injury POA: no  Measurement: 6 cm x 2 cm intact blister, distal portion blood filled with reddish discoloration to skin underneath  Wound bed: as above  Drainage (amount, consistency, odor) none, blister intact  Periwound: intact  Dressing procedure/placement/frequency: Cleanse R wrist blister with NS, apply Xeroform gauze [place] [phone]) to blister daily and cover with silicone foam.  OK to lift foam to replace Xeroform. Change foam q3 days and prn soiling.    Patient critically ill in ICU setting requiring restraints to protect lines.    POC discussed with bedside nurse. WOC team will follow every 7 to 10 days to assess and chang POC as needed.   Thank you,    Priscella Mann MSN, RN-BC, [employer] [phone]

## 2023-07-20 NOTE — Consult Note (Signed)
  Advanced HF/Shock Team Critical Care Note  Responded to Code Blue in 2H ICU.   33 y/o woman with multiple medical problems including severe cachexia, ESRD,cirrhosis, DM1   Multiple recent admissions.   Admitted 07/16/23 with AMS, severe hypoglycemia and massive volume overloaded.   Had PEA arrest yesterday and moved to ICU.   Was getting iHD today and had repeat bradycardic/PEA arrest   Dr. Chestine Spore (CCM in room). I was sitting nearby and responded as well.   On my arrival she was pulseless with bradycardic (narrow complex) rhythm on monitor. Receiving full CPR.   Received 1 amp bicarb, epi and calcium and achieved ROSC.   Epi drip being started   Echo 07/19/23 LVEF 45% RV markedly dilated and HK  Exam General:  Cachetic. Critically ill appearing Sedated on vent  HEENT: +ETT Neck:  JVP to ear Cor: Tachy regular  Lungs: rhonchorous  Abdomen: distended hypoactive BS Extremities: cool. 3-4+ tight Edema Neuro: intubated/sedated  She is terminally ill and now with progressive multi-system organ failure with repeated arrests within 24 hours.   At this point I think further aggressive care will prove futile to restoring her condition. Would strongly consider change to comfort care as she will almost undoubtedly continue to have arrests given lack of remediable issues.   D/w Dr. Chestine Spore   CRITICAL CARE Performed by: Arvilla Meres  Total critical care time: 50 minutes  Critical care time was exclusive of separately billable procedures and treating other patients.  Critical care was necessary to treat or prevent imminent or life-threatening deterioration.  Critical care was time spent personally by me (independent of midlevel providers or residents) on the following activities: development of treatment plan with patient and/or surrogate as well as nursing, discussions with consultants, evaluation of patient's response to treatment, examination of patient, obtaining history from  patient or surrogate, ordering and performing treatments and interventions, ordering and review of laboratory studies, ordering and review of radiographic studies, pulse oximetry and re-evaluation of patient's condition.  Arvilla Meres, MD  11:17 AM

## 2023-07-20 NOTE — Progress Notes (Signed)
Family has elected for transitioning to comfort care without further escalation. Janeann Forehand, NP notified of change of status and DNR bracelet/DNR orders placed.

## 2023-07-20 NOTE — Plan of Care (Signed)
  Problem: Activity: Goal: Risk for activity intolerance will decrease Outcome: Progressing   Problem: Nutrition: Goal: Adequate nutrition will be maintained Outcome: Progressing   Problem: Elimination: Goal: Will not experience complications related to bowel motility Outcome: Progressing   Problem: Metabolic: Goal: Ability to maintain appropriate glucose levels will improve Outcome: Progressing   Problem: Nutritional: Goal: Maintenance of adequate nutrition will improve Outcome: Progressing

## 2023-07-20 NOTE — Progress Notes (Signed)
Patient suddenly developed bradycardia in 94s and Dr. Chestine Spore was called to bedside. Patient soon became pulseless and code blue was initiated. ACLS was followed as well as verbal orders given by MDs at bedside. ROSC achieved at 1054. Family notified to come to bedside as soon as possible.

## 2023-07-20 NOTE — Progress Notes (Signed)
NAME:  Ann Robertson, MRN:  [id number], DOB:  03-Jan-1991, LOS: 3 ADMISSION DATE:  07/16/2023 CONSULTATION DATE:  07/19/2023 REFERRING MD:  Nelson Chimes - TRH, CHIEF COMPLAINT:  Post-cardiac arrest   History of Present Illness:  33 year old woman who initially presented to [facility] 2/15 for AMS. PMHx significant for HTN, HLD, T1DM (c/b DKA), ESRD (on HD MWF via LUE AVF), anemia, seizure, HSV2.  Per chart review, patient's son called EMS when she began acting abnormally; GCS was noted to be 4 with CBG 32. Patient's son endorses that she has had poor PO intake. Glucagon was administered with improvement in GCS to 9 and CBG to 50. Continued to be hypoglycemic in ED requiring D50 amps x 2 and subsequent D10 gtt initiation. Additional concern for possible sepsis with empiric broad-spectrum antibiotics started (vanc/cefepime). Nephrology was consulted for continuation of HD.  On 2/18AM, patient was noted to be unresponsive and pulseless with initial rhythm PEA. Code Blue was called and CPR administered x 5 minutes with Epi x 2 and ROSC. Patient was intubated peri-code. She was subsequently transferred to [facility] CVICU.  PCCM consulted for ICU transfer post-arrest.  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 Initial rhythm PEA. CPR x 5 minutes, Epi x 2 with ROSC.  Intubated peri-code. Transferred to 2H for further care, PCCM consulted for ICU management. 2/19 remains intubated and unresponsive on low-dose fentanyl and propofol.  Became profoundly hypotensive with intermittent HD, quickly corrected with peripheral pressors  Interim History / Subjective:  Called to bedside for severe hypotension and initiation of intermittent hemodialysis which  Objective:  Blood pressure (!) 166/72, pulse 82, temperature (!) 95.9 F (35.5 C), resp. rate 18, height 5\' 5"  (1.651 m), weight 54.8 kg, SpO2 100%.    Vent Mode: PRVC FiO2 (%):  [40 %] 40 % Set Rate:  [12 bmp] 12 bmp Vt Set:  [400 mL] 400 mL PEEP:  [5 cmH20] 5 cmH20 Plateau Pressure:  [17 cmH20-19 cmH20] 17 cmH20   Intake/Output Summary (Last 24 hours) at 07/20/2023 0914 Last data filed at 07/20/2023 0600 Gross per 24 hour  Intake 738.9 ml  Output --  Net 738.9 ml   Filed Weights   07/19/23 0500 07/20/23 0500 07/20/23 0804  Weight: 56 kg 54.8 kg 54.8 kg   Physical Examination: General: Acute on chronic ill-appearing deconditioned adult female lying in bed on mechanical ventilation in no acute distress HEENT: ETT, MM pink/moist, PERRL,  Neuro: Unresponsive on ventilator CV: s1s2 regular rate and rhythm, no murmur, rubs, or gallops,  PULM: Clear to auscultation bilaterally, no increased work of breathing, no added breath sounds, tolerating ventilator GI: soft, bowel sounds active in all 4 quadrants, non-tender, non-distended, tolerating TF Extremities: warm/dry, 2+ pitting edema tracking up to ribs  Skin: no rashes  or lesions  Resolved Hospital Problem List:    Assessment & Plan:  Post-cardiac arrest -2/17 unresponsive and pea arrest; ROSC 5 minutes HFrEF -Echo 2/18 EF 45 to 50% with global hypokinesis and mild concentric left ventricular hypertrophy and signs of RV overload P: Goal of normothermia Trend lactic acid and troponin Continuous telemetry Optimize electrolytes Strict intake and  output Volume removal per HD  Acute encephalopathy, initially presumed metabolic, now s/p cardiac arrest with concern for possible anoxic injury History of seizure -CT Head 2/16 NAICA, small to moderate R mastoid effusion. -Spot EEG negative for seizure activity P: Maintain neuro protective measures; goal for eurothermia, euglycemia, eunatermia, normoxia, and PCO2 goal of 35-40 Nutrition and bowel regiment  Seizure precautions  Aspirations precautions  Minimize sedation as able Consider MRI 72 hours post arrest if mentation remains poor  Acute hypoxemic respiratory failure in the setting of cardiac arrest P: Continue ventilator support with lung protective strategies  Wean PEEP and FiO2 for sats greater than 90%. Head of bed elevated 30 degrees. Plateau pressures less than 30 cm H20.  Follow intermittent chest x-ray and ABG.   SAT/SBT as tolerated, mentation preclude extubation  Ensure adequate pulmonary hygiene  Follow cultures  VAP bundle in place  PAD protocol  T1DM with hypoglycemia Pancreatic insufficiency -History of T1DM with multiple hypoglycemic events. -Poor/inconsistent PO intake. Previously had DKA. -C-peptide less than 0.1 P: Continue SSI, reduced to every 6 hours per diabetes coordinator recommendation CBG checks every 4 Home Creon remains on hold  Possible BLE cellulitis? P: Continue empiric Doxy Trend CBC and fever curve  Right superficial venous thrombus -Findings consistent with acute superficial vein thrombosis involving the right cephalic vein. P: Supportive care  Hypothyroidism P: Continue Synthroid  HTN HLD P: Home antihypertensive meds on hold Continue home statin  ESRD on HD MWF Oliguria/anuria Episodes of hypotension on initiation of IHD morning of 2/19 resulting in need of peripheral pressors P: Nephrology following, appreciate assistance Volume removal per IHD as able Scheduled IV albumin Monitor intake and output Optimize  electrolytes  Anemia of chronic disease Thrombocytopenia, presume liver related? P: Trend CBC Transfuse per protocol Hemoglobin goal greater than 7 Platelet goal greater than 10  Ascites Anasarca Malnutrition -Small amount of pelvic free fluid noted as early as 10/2020, increasing volume of abdominopelvic ascites and anasarca since (noted to be massive ascites 05/2023). ? Nodular contour of liver noted on US Abdomen 01/2022, Bedside POCUS 2/18AM with significant abdominopelvic ascites, simple-appearing on [place].  P: Volume removal per IHD as able Optimize nutrition  Best Practice: (right click and "Reselect all SmartList Selections" daily)   Diet/type: NPO; TF today if not extubated DVT prophylaxis: prophylactic heparin  GI prophylaxis: PPI Lines: N/A Foley:  N/A Code Status:  full code Last date of multidisciplinary goals of care discussion continue to update family daily  Critical care time:   CRITICAL CARE Performed by: Cheyla Duchemin D. Harris   Total critical care time: 40 minutes  Critical care time was exclusive of separately billable procedures and treating other patients.  Critical care was necessary to treat or prevent imminent or life-threatening deterioration.  Critical care was time spent personally by me on the following activities: development of treatment plan with patient and/or surrogate as well as nursing, discussions with consultants, evaluation of patient's response to treatment, examination of patient, obtaining history from patient or surrogate, ordering and performing treatments and interventions, ordering and review of laboratory studies, ordering and review of radiographic studies, pulse oximetry and  re-evaluation of patient's condition.  Farhana Fellows D. Harris, NP-C  Pulmonary & Critical Care Personal contact information can be found on Amion  If no contact or response made please call [phone] 07/20/2023, 9:36 AM

## 2023-07-20 NOTE — Procedures (Addendum)
BP not reading, moved cuff, still unable to get BP but HR WNL. UF turned off. Nurse notified. Came into room. Shortly after patient brady down into 40s. Nurse and MD notified and at bedside.  Patient soon became unresponsive, code blue initiated.

## 2023-07-20 NOTE — Code Documentation (Signed)
Code blue:  During iHD suddenly developed junctional bradycardia with HR in 40s, no pulses. CPR started, BVM, epi per ACLS algorithm. Calcium, bicarb given.  ROSC achieved.  Family contacted- father urged to come to the hospital quickly. Prognosis very poor.  Steffanie Dunn, DO 07/20/23 11:00 AM [place] [facility]  For contact information, see Amion. If no response to pager, please call PCCM consult pager. After hours, 7PM- 7AM, please call Elink.

## 2023-07-20 NOTE — Progress Notes (Signed)
[facility] Progress Note   Subjective:  On HD this am pt had another code blue PEA arrest, HD was aborted CHF team was consulted  Echo showed severe RV failure  Objective Vitals:   07/20/23 1015 07/20/23 1030 07/20/23 1045 07/20/23 1111  BP: 96/64 (S) (!) 84/56    Pulse: 85 86 79 (!) 0  Resp: 12 17 14    Temp: (!) 96.8 F (36 C) (!) 96.8 F (36 C) (!) 97 F (36.1 C)   TempSrc:      SpO2:  100%    Weight:      Height:       Physical Exam General: Chronically ill, emaciated, on vent  Heart: S1,S2 RRR No M/R/G Lungs: Orally intubated with bilateral breath sound symmetrical chest excursions. Decreased in bases otherwise CTAB.  Abdomen: NABS + Ascites Extremities: 2+ BLE pitting edema Dialysis Access: AVG + T/B  Additional Objective Labs: Basic Metabolic Panel: Recent Labs  Lab 07/18/23 1408 07/19/23 0218 07/19/23 0348 07/20/23 0447 07/20/23 0814  NA 124* 128* 128*  --  126*  K 4.3 4.3 4.0 4.1 4.1  CL 89* 90*  --   --  88*  CO2 25 23  --   --  25  GLUCOSE 173* 148*  --   --  192*  BUN 31* 22*  --   --  33*  CREATININE 4.09* 3.17*  --   --  3.67*  CALCIUM 7.5* 7.9*  --   --  7.6*  PHOS 3.6 3.4  --  3.5 3.5   Liver Function Tests: Recent Labs  Lab 07/16/23 1206 07/17/23 0848 07/18/23 1408 07/19/23 0215 07/19/23 0218 07/20/23 0814  AST 26 30  --  34  --   --   ALT 14 15  --  12  --   --   ALKPHOS 147* 141*  --  140*  --   --   BILITOT 1.4* 2.4*  --  3.0*  --   --   PROT 6.0* 6.4*  --  6.5  --   --   ALBUMIN 2.4* 2.3*   < > 2.4* 2.3* 2.0*   < > = values in this interval not displayed.   No results for input(s): "LIPASE", "AMYLASE" in the last 168 hours. CBC: Recent Labs  Lab 07/16/23 1206 07/16/23 1214 07/17/23 0848 07/18/23 1408 07/19/23 0216 07/19/23 0348 07/20/23 0447 07/20/23 0814  WBC 2.6*  --  3.4* 7.0 5.6  --  6.1 5.8  NEUTROABS 1.6*  --   --   --   --   --   --   --   HGB 12.1   < > 10.2* 9.3* 10.3* 11.6* 9.9* 9.3*   HCT 39.9   < > 31.1* 28.5* 32.3* 34.0* 30.3* 28.9*  MCV 95.0  --  88.9 89.6 91.0  --  88.1 88.7  PLT 101*  --  63* 44* 37*  --  35* 37*   < > = values in this interval not displayed.   Blood Culture    Component Value Date/Time   SDES BLOOD RIGHT WRIST 07/16/2023 1159   SPECREQUEST  07/16/2023 1159    BOTTLES DRAWN AEROBIC ONLY Blood Culture results may not be optimal due to an inadequate volume of blood received in culture bottles   CULT  07/16/2023 1159    NO GROWTH 4 DAYS Performed at [facility], [address] [place], [place] [address]    REPTSTATUS PENDING 07/16/2023 1159  Cardiac Enzymes: Recent Labs  Lab 07/16/23 1206  CKTOTAL 95   CBG: Dialysis Orders: MWF-[facility] 4 hrs 180NRe 400/Autoflow 1.5 46.5 kg 2.0K/3.0 Ca AVG - Heparin 2500 units IV three times per week - Calcitriol 0.25 mcg PO three times per week - Mircera 120 mcg IV q 4 weeks  (last dose 75 mcg 07/10/2023) - Venofer 50 mg IV weeks (Just finished Fe Load 07/04/2023)   Assessment/Plan:  PEA Arrest x 2: had 2nd arrest during HD this am. HD aborted.  Acute hypoxemic respiratory failure - on vent, per CCM.  Volume overload - w/ LE anasarca and severe ascites. Suspect severe RV failure. Seen by CHF team Dr Gala Romney today.  Severe biventricular CHF: severe RF failure by echo, EF 45% ESRD - on HD MWF. Last OP HD 2/12. HD today was aborted due to cardiac arrest. Pt is not a candidate for CRRT due to severe comorbidities. Doubt she can tolerate further HD w/ repeated arrests and obvious severe heart disease. Recommend transition to comfort, have d/w CCM.  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 no esa needs.  Secondary hyperparathyroidism - holding binders for now.  DMT1     Vinson Moselle  MD  [facility] 07/20/2023, 11:22 AM  Recent Labs  Lab 07/19/23 0218 07/19/23 0348 07/20/23 0447 07/20/23 0814  HGB  --    < > 9.9* 9.3*  ALBUMIN 2.3*  --   --  2.0*  CALCIUM 7.9*  --   --  7.6*  PHOS  3.4  --  3.5 3.5  CREATININE 3.17*  --   --  3.67*  K 4.3   < > 4.1 4.1   < > = values in this interval not displayed.    Inpatient medications:  acetaminophen  650 mg Oral Q4H   Or   acetaminophen (TYLENOL) oral liquid 160 mg/5 mL  650 mg Per Tube Q4H   Or   acetaminophen  650 mg Rectal Q4H   atorvastatin  10 mg Per Tube Daily   Chlorhexidine Gluconate Cloth  6 each Topical Q0600   docusate  100 mg Per Tube BID   doxycycline  100 mg Per Tube Q12H   EPINEPHrine NaCl       feeding supplement (PROSource TF20)  60 mL Per Tube Daily   fentaNYL (SUBLIMAZE) injection  50 mcg Intravenous Once   insulin aspart  0-6 Units Subcutaneous Q6H   levothyroxine  25 mcg Per Tube Q0600   mouth rinse  15 mL Mouth Rinse Q2H   pantoprazole (PROTONIX) IV  40 mg Intravenous Daily   phenylephrine       polyethylene glycol  17 g Per Tube Daily   sodium chloride flush  3 mL Intravenous Q12H   thiamine  100 mg Per Tube Daily    sodium chloride     sodium chloride     albumin human 25 g (07/20/23 1022)   anticoagulant sodium citrate     dextrose 5 % and 0.9 % NaCl 20 mL/hr at 07/20/23 0600   epinephrine     feeding supplement (VITAL 1.5 CAL) 20 mL/hr at 07/20/23 0600   fentaNYL infusion INTRAVENOUS 50 mcg/hr (07/20/23 0600)   magnesium sulfate Stopped (07/19/23 0504)   norepinephrine (LEVOPHED) Adult infusion     propofol (DIPRIVAN) infusion 10 mcg/kg/min (07/20/23 0650)   sodium chloride Stopped (07/16/23 1346)   acetaminophen **OR** acetaminophen (TYLENOL) oral liquid 160 mg/5 mL **OR** acetaminophen, alteplase, anticoagulant sodium citrate, busPIRone **OR** busPIRone, EPINEPHrine NaCl,  fentaNYL, heparin, lidocaine (PF), lidocaine-prilocaine, magnesium sulfate, ondansetron (ZOFRAN) IV, mouth rinse, pentafluoroprop-tetrafluoroeth, phenylephrine

## 2023-07-20 NOTE — TOC Initial Note (Addendum)
Transition of Care [facility]) - Initial/Assessment Note    Patient Details  Name: Ann Robertson MRN: [id number] Date of Birth: 09-03-1990  Transition of Care [facility]) CM/SW Contact:    Elliot Cousin, RN Phone Number: [phone] 07/20/2023, 10:44 AM  Clinical Narrative:   Readmission              Patient emergency contact, father, Casimiro Needle wanted to speak atteding. Father has concerned that patient unable to get ready for dialysis. States she was not able to care for herself at home. She has minor children in the home but they are school. Pt had Home Health, but father states it was note enough. States she will need caregiver in the home. Pt will need Medicaid PCS services.  Father would like IP rehab or SNF rehab. Explained that PT/OT will evaluate when pt is medically ready and make recommendations.   Expected Discharge Plan: IP Rehab Facility Barriers to Discharge: Continued Medical Work up   Patient Goals and CMS Choice   CMS Medicare.gov Compare Post Acute Care list provided to:: Patient Represenative (must comment) Choice offered to / list presented to : Parent      Expected Discharge Plan and Services   Discharge Planning Services: CM Consult Post Acute Care Choice: IP Rehab Living arrangements for the past 2 months: Single Family Home                                      Prior Living Arrangements/Services Living arrangements for the past 2 months: Single Family Home Lives with:: Minor Children   Do you feel safe going back to the place where you live?: Yes          Current home services: DME (rolling walker, wheelchair)    Activities of Daily Living      Permission Sought/Granted Permission sought to share information with : Case Manager, Family Supports, PCP                Emotional Assessment   Attitude/Demeanor/Rapport: Intubated (Following Commands or Not Following Commands)          Admission diagnosis:  Hypoglycemia  [E16.2] Acute encephalopathy [G93.40] Patient Active Problem List   Diagnosis Date Noted   Cardiac arrest, cause unspecified (HCC) 07/19/2023   Protein-calorie malnutrition, severe 07/19/2023   Acute encephalopathy 07/16/2023   Seizure (HCC) 06/12/2023   Gastroenteritis 05/30/2023   Hypoglycemia 02/02/2023   Diarrhea 02/02/2023   Ascites 02/02/2023   Malnutrition of moderate degree 06/08/2022   End-stage renal disease on hemodialysis (HCC) 06/16/2021   Hyperkalemia 04/18/2021   Prolonged QT interval 04/11/2021   HLD (hyperlipidemia) 03/09/2021   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 [place] [address] Phone: [phone] Fax: [phone]  [facility] [address] [place] [address] Phone: [phone] Fax: [phone]     Social Drivers of Health (SDOH) Social History: SDOH Screenings   Food Insecurity: No Food Insecurity (07/18/2023)  Housing: Low Risk  (07/18/2023)  Transportation  Needs: No Transportation Needs (07/18/2023)  Utilities: Not At Risk (07/18/2023)  Social Connections: Unknown (09/28/2021)   Received from [facility]  Tobacco Use: High Risk (07/16/2023)   SDOH Interventions:     Readmission Risk Interventions     No data to display

## 2023-07-20 NOTE — Procedures (Signed)
Partial post assessment completed due to central line being placed.

## 2023-07-20 NOTE — Progress Notes (Signed)
   07/20/23 1228  Spiritual Encounters  Type of Visit Follow up  Care provided to: [facility] partners present during encounter Nurse;Physician  Reason for visit Urgent spiritual support   Chaplain returned to room seeking Pt family's arrival.  Ann Robertson was present when Pt's Father Kathlene November) arrived.  Father spoke with PA and chaplain was present.  PA suggested ceasing aggressive life saving measures and transitioning to comfort care.  Father is waiting on more family to arrive.  Chaplain has stepped away to provide privacy to the family. Family has been instructed how to reach out to spiritual care if they would like chaplain to return.  Chaplain services remain available by Spiritual Consult or for emergent cases, paging [phone]  Chaplain Raelene Bott, MDiv [email or web address] [phone]

## 2023-07-20 NOTE — Progress Notes (Signed)
Brief PCCM progress note  Notified by bedside RN that family has elected for transition of comfort care.  Family would like to wait for one additional family member prior to extubation.  In the meantime they have elected for no further intervention such as blood draws or medications.  Comfort care order set placed.  Meghan Tiemann D. Harris, NP-C [place] [facility] Personal contact information can be found on Amion  If no contact or response made please call [phone] 07/20/2023, 3:21 PM

## 2023-07-20 NOTE — Progress Notes (Addendum)
Code blue, rinsed back. Dr. Arlean Hopping notified, said stop tx for today.

## 2023-07-20 NOTE — Procedures (Signed)
Received patient in unit bed.  Pt. On ventilator-responds to pain.  Informed consent signed and in chart.    See progress notes.  Patient already in room.  Patient on ventilator.  Hand-off given to patient's nurse.   Access used: left avf Access issues: none  Total UF removed: 2.7 L Medication(s) given: none  Lu Duffel, RN Kidney Dialysis Unit

## 2023-07-20 NOTE — Procedures (Addendum)
Dr.Schertz notified of patient BP, patient placed in trendelenburg.

## 2023-07-20 NOTE — Procedures (Signed)
Cortrak  Person Inserting Tube:  Devyon Keator T, RD Tube Type:  Cortrak - 43 inches Tube Size:  10 Tube Location:  Left nare Initial Placement:  Stomach Secured by: Bridle Technique Used to Measure Tube Placement:  Marking at nare/corner of mouth Cortrak Secured At:  62 cm   Cortrak Tube Team Note:  Consult received to place a Cortrak feeding tube.   No x-ray is required. RN may begin using tube.   If the tube becomes dislodged please keep the tube and contact the Cortrak team at [email or web address] for replacement.  If after hours and replacement cannot be delayed, place a NG tube and confirm placement with an abdominal x-ray.    Shelle Iron RD, LDN Contact via [employer].

## 2023-07-20 NOTE — Progress Notes (Signed)
UF placed in pause due to low BP, floor nurse notified so he can increase levophed.

## 2023-07-20 NOTE — Progress Notes (Signed)
   07/20/23 1112  Spiritual Encounters  Type of Visit Initial  Care provided to: Patient  Conversation partners present during encounter Nurse  Referral source Code page  Reason for visit Code   Chaplain responding to Code Blue. No support person present.  Code seemed to be resolved by the time chaplain arrived.  Chaplain spoke with PA who told me she called Pt's father and encouraged him to come immediately and he is bring other family members.  Also spoke with a physician who indicated the conversation with family will most likely be one of comfort care. Pt is critically ill with very poor prognosis.  Chaplain will return in 40 minutes hoping to connect with family.  Chaplain services remain available by Spiritual Consult or for emergent cases, paging [phone]  Chaplain Raelene Bott, MDiv [email or web address] [phone]

## 2023-07-21 DIAGNOSIS — G9340 Encephalopathy, unspecified: Secondary | ICD-10-CM | POA: Diagnosis not present

## 2023-07-21 DIAGNOSIS — Z515 Encounter for palliative care: Secondary | ICD-10-CM

## 2023-07-21 LAB — CULTURE, BLOOD (ROUTINE X 2)
Culture: NO GROWTH
Culture: NO GROWTH

## 2023-07-21 LAB — GLUCOSE, CAPILLARY: Glucose-Capillary: 120 mg/dL — ABNORMAL HIGH (ref 70–99)

## 2023-07-30 NOTE — Progress Notes (Signed)
RT NOTE: PT extubated to comfort care per order and family wishes.

## 2023-07-30 NOTE — Death Summary Note (Signed)
DEATH SUMMARY   Patient Details  Name: Ann Robertson MRN: [id number] DOB: [birth year]  Admission/Discharge Information   Admit Date:  08/12/2023  Date of Death: Date of Death: 17-Aug-2023  Time of Death: Time of Death: 08-20-1620  Length of Stay: 4  Referring Physician: Felix Pacini, FNP   Reason(s) for Hospitalization  hypoglycemia  Diagnoses  Preliminary cause of death:  Secondary Diagnoses (including complications and co-morbidities):  Principal Problem:   Acute encephalopathy Active Problems:   Type 1 diabetes mellitus (HCC)   Anemia   Essential hypertension   Peripheral neuropathy   Prolonged QT interval   HLD (hyperlipidemia)   End-stage renal disease on hemodialysis (HCC)   Hypoglycemia   Seizure (HCC)   Cardiac arrest, cause unspecified (HCC)   Protein-calorie malnutrition, severe  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 year old female who initially presented to [facility] August 12, 2023 for AMS. PMHx significant for HTN, HLD, T1DM (c/b DKA), ESRD (on HD MWF via LUE AVF), anemia, seizure, HSV2.   Per chart review, patient's son called EMS when she began acting abnormally; GCS was noted to be 4 with CBG 32. Patient's son endorses that she has had poor PO intake. Glucagon was administered with improvement in GCS to 9 and CBG to 50. Continued to be hypoglycemic in ED requiring D50  amps x 2 and subsequent D10 gtt initiation. Additional concern for possible sepsis with empiric broad-spectrum antibiotics started (vanc/cefepime). Nephrology was consulted for continuation of HD.   On 2/18AM, patient was noted to be unresponsive and pulseless with initial rhythm PEA. Code Blue was called and CPR administered x 5 minutes with Epi x 2 and ROSC. Patient was intubated peri-code. She was subsequently transferred to [facility] CVICU.   PCCM consulted for ICU transfer post-arrest.  While admitted to ICU she remained on vasopressors, with increasing requirements during dialysis. She was not able to wean from MV until she had volume off due to poor lung compliance. She had a bradycardia PEA arrest during HD felt to be due to severe RV failure seen on echocardiogram. CT PE did not show PE, but did confirm evidence of chronic RV failure and pressure overload. She was deemed to not have options for cardiac recovery and was not tolerating HD, so it was discontinued. The family was brought in and educated on her multiple end stage medical problems- cirrhosis, ESRD, acute RV failure likely due to cirrhosis and hypervolemia, type 1 DM, severe protein energy malnutrition. At the recommendation of the medical team, she was kept comfortable and passed away with family at bedside on 08-17-2023.    Pertinent Labs and Studies  Significant Diagnostic Studies VAS [place] LOWER EXTREMITY VENOUS (DVT) Result Date: 07/20/2023  Lower Venous DVT Study Patient Name:  Ann Robertson  Date of Exam:   07/20/2023 Medical Rec #: [id number]                Accession #:    [id number] Date of Birth: May 22, 1991                Patient  Gender: F Patient Age:   46 years Exam Location:  [facility] Procedure:      VAS [place] LOWER EXTREMITY VENOUS (DVT) Referring Phys: Rochel Brome --------------------------------------------------------------------------------  Indications: Swelling, and Edema.  Limitations: Poor ultrasound/tissue  interface. Comparison Study: No prior exam. Performing Technologist: Fernande Bras  Examination Guidelines: A complete evaluation includes B-mode imaging, spectral Doppler, color Doppler, and power Doppler as needed of all accessible portions of each vessel. Bilateral testing is considered an integral part of a complete examination. Limited examinations for reoccurring indications may be performed as noted. The reflux portion of the exam is performed with the patient in reverse Trendelenburg.  +---------+---------------+---------+-----------+----------+--------------+ RIGHT    CompressibilityPhasicitySpontaneityPropertiesThrombus Aging +---------+---------------+---------+-----------+----------+--------------+ CFV      Full           Yes      Yes                                 +---------+---------------+---------+-----------+----------+--------------+ SFJ      Full           Yes      Yes                                 +---------+---------------+---------+-----------+----------+--------------+ FV Prox  Full                                                        +---------+---------------+---------+-----------+----------+--------------+ FV Mid   Full                                                        +---------+---------------+---------+-----------+----------+--------------+ FV DistalFull                                                        +---------+---------------+---------+-----------+----------+--------------+ PFV      Full                                                        +---------+---------------+---------+-----------+----------+--------------+ POP      Full           Yes      Yes                                 +---------+---------------+---------+-----------+----------+--------------+ PTV      Full                                                        +---------+---------------+---------+-----------+----------+--------------+  PERO  Full                                                        +---------+---------------+---------+-----------+----------+--------------+ Limited due to calcification of adjacent arteries. Rouleaux flow in bilateral lower extremities.  +---------+---------------+---------+-----------+----------+--------------+ LEFT     CompressibilityPhasicitySpontaneityPropertiesThrombus Aging +---------+---------------+---------+-----------+----------+--------------+ CFV      Full           Yes      Yes                                 +---------+---------------+---------+-----------+----------+--------------+ SFJ      Full           Yes      Yes                                 +---------+---------------+---------+-----------+----------+--------------+ FV Prox  Full                                                        +---------+---------------+---------+-----------+----------+--------------+ FV Mid   Full                                                        +---------+---------------+---------+-----------+----------+--------------+ FV DistalFull                                                        +---------+---------------+---------+-----------+----------+--------------+ PFV      Full                                                        +---------+---------------+---------+-----------+----------+--------------+ POP      Full           Yes      Yes                                 +---------+---------------+---------+-----------+----------+--------------+ PTV      Full                                                        +---------+---------------+---------+-----------+----------+--------------+ PERO     Full                                                        +---------+---------------+---------+-----------+----------+--------------+  Cystic structure noted in left popliteal fossa measuring 3.4 x 0.6 x 1.6 cm. Limited due to  calcification of adjacent arteries. Rouleaux flow in bilateral lower extremities.    Summary: RIGHT: - There is no evidence of deep vein thrombosis in the lower extremity.  - No cystic structure found in the popliteal fossa.  LEFT: - There is no evidence of deep vein thrombosis in the lower extremity.  - A cystic structure is found in the popliteal fossa.  *See table(s) above for measurements and observations. Electronically signed by Coral Else MD on 07/20/2023 at 8:09:18 PM.    Final    Portable Chest xray Result Date: 07/20/2023 CLINICAL DATA:  Endotracheal tube placement. EXAM: PORTABLE CHEST 1 VIEW COMPARISON:  July 19, 2023. FINDINGS: Stable cardiomediastinal silhouette. Endotracheal and nasogastric tubes are unchanged. Mild bibasilar atelectasis or infiltrates are noted with possible small pleural effusions. Bony thorax is unremarkable. IMPRESSION: Stable support apparatus. Mild bibasilar atelectasis or infiltrates are noted with possible small pleural effusions. Electronically Signed   By: Lupita Raider M.D.   On: 07/20/2023 10:16   CT Angio Chest Pulmonary Embolism (PE) W or WO Contrast Result Date: 07/20/2023 CLINICAL DATA:  Respiratory failure preceded by coughing and shortness of breath. Clinically suspected pulmonary embolism. EXAM: CT ANGIOGRAPHY CHEST WITH CONTRAST TECHNIQUE: Multidetector CT imaging of the chest was performed using the standard protocol during bolus administration of intravenous contrast. Multiplanar CT image reconstructions and MIPs were obtained to evaluate the vascular anatomy. RADIATION DOSE REDUCTION: This exam was performed according to the departmental dose-optimization program which includes automated exposure control, adjustment of the mA and/or kV according to patient size and/or use of iterative reconstruction technique. CONTRAST:  75mL OMNIPAQUE IOHEXOL 350 MG/ML SOLN COMPARISON:  Portable chest from earlier today, Portable chest 07/16/2023, portable chest  06/12/2023 and CT abdomen and pelvis without contrast from 06/13/2023. FINDINGS: Cardiovascular: There is adequate arterial opacification at least to the segmental level. No arterial embolism is seen through the segmental divisions. The subsegmental arterial bed is largely obscured due to breathing motion. There is panchamber moderate cardiomegaly with a right chamber predominance and elevated RV/LV ratio of 1.2 with IVC and hepatic vein reflux in keeping with right heart dysfunction and/or tricuspid regurgitation. There is a small pericardial effusion, most of which is in the superior recess. There are scattered three-vessel coronary calcifications. This is age-advanced. The aorta and great vessels are normal. Central pulmonary veins are normal caliber. Mediastinum/Nodes: ETT is in position with tip 2 cm from the carina. The trachea and main bronchi are clear. NGT enters the stomach but the intragastric course is not included in the study. Thoracic esophagus is otherwise unremarkable. No axillary adenopathy seen. The lower poles of the thyroid gland are unremarkable. There is no intrathoracic adenopathy. Lungs/Pleura: Small left and moderate-sized right layering pleural effusions are noted, new on the left and unchanged on the right since the 06/13/2023 CT of abdomen and pelvis. There is adjacent atelectasis or consolidation in both lower lobes, greater on the right. There are atelectatic changes in the base of the lingula and right middle lobe. No other significant lung opacity is seen. No interlobular septal edema is evident.  No pulmonary nodules. Upper Abdomen: Large-volume abdominal ascites, appears increased since 06/13/2023. Musculoskeletal: Diffuse chest wall anasarca. No discrete chest wall mass. No acute or significant osseous findings. Review of the MIP images confirms the above findings. IMPRESSION: 1. No evidence of arterial embolus through the segmental divisions. The subsegmental arterial bed largely  obscured due to breathing motion. 2. Cardiomegaly with elevated RV/LV ratio of 1.2 with IVC and hepatic vein reflux in keeping with right heart dysfunction and/or tricuspid regurgitation. 3. Small pericardial effusion. 4. Small left and moderate-sized right layering pleural effusions, new on the left and unchanged on the right since 06/13/2023. 5. Adjacent atelectasis or consolidation in both lower lobes, greater on the right. 6. Large-volume abdominal ascites, increased since 06/13/2023. 7. Diffuse chest wall anasarca. 8. Three-vessel age-advanced coronary calcifications. Electronically Signed   By: Almira Bar M.D.   On: 07/20/2023 00:16   VAS [place] UPPER EXTREMITY VENOUS DUPLEX Result Date: 07/19/2023 UPPER VENOUS STUDY  Patient Name:  NIKIYAH FACKLER  Date of Exam:   07/19/2023 Medical Rec #: [id number]                Accession #:    [id number] Date of Birth: Jan 28, 1991                Patient Gender: F Patient Age:   16 years Exam Location:  [facility] Procedure:      VAS [place] UPPER EXTREMITY VENOUS DUPLEX Referring Phys: Pia Mau --------------------------------------------------------------------------------  Indications: Swelling Comparison Study: No prior exam. Performing Technologist: Fernande Bras  Examination Guidelines: A complete evaluation includes B-mode imaging, spectral Doppler, color Doppler, and power Doppler as needed of all accessible portions of each vessel. Bilateral testing is considered an integral part of a complete examination. Limited examinations for reoccurring indications may be performed as noted.  Right Findings: +----------+------------+---------+-----------+----------+-------+ RIGHT     CompressiblePhasicitySpontaneousPropertiesSummary +----------+------------+---------+-----------+----------+-------+ IJV           Full       Yes       Yes                      +----------+------------+---------+-----------+----------+-------+ Subclavian     Full       Yes       Yes                      +----------+------------+---------+-----------+----------+-------+ Axillary      Full       Yes       Yes                      +----------+------------+---------+-----------+----------+-------+ Brachial      Full       Yes       Yes                      +----------+------------+---------+-----------+----------+-------+ Radial        Full                                          +----------+------------+---------+-----------+----------+-------+ Ulnar         Full                                          +----------+------------+---------+-----------+----------+-------+ Cephalic      None       No        No                       +----------+------------+---------+-----------+----------+-------+ Basilic  Full       Yes       Yes                      +----------+------------+---------+-----------+----------+-------+  Left Findings: +----------+------------+---------+-----------+----------+-------+ LEFT      CompressiblePhasicitySpontaneousPropertiesSummary +----------+------------+---------+-----------+----------+-------+ Subclavian    Full       Yes       Yes                      +----------+------------+---------+-----------+----------+-------+  Summary:  Right: No evidence of deep vein thrombosis in the upper extremity. Findings consistent with acute superficial vein thrombosis involving the right cephalic vein.  Left: No evidence of thrombosis in the subclavian.  *See table(s) above for measurements and observations.  Diagnosing physician: Lemar Livings MD Electronically signed by Lemar Livings MD on 07/19/2023 at 4:35:20 PM.    Final    ECHOCARDIOGRAM COMPLETE Result Date: 07/19/2023    ECHOCARDIOGRAM REPORT   Patient Name:   Laiklynn Raczynski Date of Exam: 07/19/2023 Medical Rec #:  [id number]               Height:       65.0 in Accession #:    [id number]              Weight:       123.5 lb Date of  Birth:  1990/11/01               BSA:          1.611 m Patient Age:    32 years                BP:           124/77 mmHg Patient Gender: F                       HR:           109 bpm. Exam Location:  Inpatient Procedure: 2D Echo, 3D Echo, Cardiac Doppler, Color Doppler and Strain Analysis            (Both Spectral and Color Flow Doppler were utilized during            procedure). Indications:    cardiac arrest  History:        Patient has no prior history of Echocardiogram examinations.                 Cardiac arrest; Risk Factors:Diabetes, Hypertension and                 Dyslipidemia.  Sonographer:    Dondra Prader RVT RCS Referring Phys: Elenore Paddy REESE IMPRESSIONS  1. Papillary muscle hypertrophy. Left ventricular ejection fraction, by estimation, is 45 to 50%. Left ventricular ejection fraction by 3D volume is 44 %. The left ventricle has mildly decreased function. The left ventricle demonstrates global hypokinesis. There is mild concentric left ventricular hypertrophy. Left ventricular diastolic parameters are indeterminate. There is abnormal (paradoxical) septal motion, consistent with right ventricular volume overload. The average left ventricular global longitudinal strain is -11.5 %. The global longitudinal strain is abnormal.  2. Right ventricular systolic function is moderately reduced. The right ventricular size is severely enlarged.  3. A small pericardial effusion is present. The pericardial effusion is posterior to the left ventricle. There is no evidence of cardiac tamponade.  4. The mitral valve is normal in structure. No evidence of mitral valve regurgitation.  No evidence of mitral stenosis.  5. Tricuspid valve regurgitation is severe.  6. The aortic valve is tricuspid. Aortic valve regurgitation is not visualized. No aortic stenosis is present. Comparison(s): Prior images reviewed side by side. Pericardial effusion has increased. Ventricular function has decreased. IVC not well visualized for  RVSP comparison. Tricuspid regurgitation is significant. FINDINGS  Left Ventricle: Papillary muscle hypertrophy. Left ventricular ejection fraction, by estimation, is 45 to 50%. Left ventricular ejection fraction by 3D volume is 44 %. The left ventricle has mildly decreased function. The left ventricle demonstrates global hypokinesis. The average left ventricular global longitudinal strain is -11.5 %. Strain was performed and the global longitudinal strain is abnormal. The left ventricular internal cavity size was normal in size. There is mild concentric left ventricular hypertrophy. Abnormal (paradoxical) septal motion, consistent with right ventricular volume overload. Left ventricular diastolic parameters are indeterminate. Right Ventricle: The right ventricular size is severely enlarged. No increase in right ventricular wall thickness. Right ventricular systolic function is moderately reduced. Left Atrium: Left atrial size was not well visualized. Right Atrium: Right atrial size was normal in size. Pericardium: A small pericardial effusion is present. The pericardial effusion is posterior to the left ventricle. There is no evidence of cardiac tamponade. Mitral Valve: The mitral valve is normal in structure. No evidence of mitral valve regurgitation. No evidence of mitral valve stenosis. Tricuspid Valve: The tricuspid valve is normal in structure. Tricuspid valve regurgitation is severe. Aortic Valve: The aortic valve is tricuspid. Aortic valve regurgitation is not visualized. No aortic stenosis is present. Aortic valve mean gradient measures 3.0 mmHg. Aortic valve peak gradient measures 5.9 mmHg. Aortic valve area, by VTI measures 2.04 cm. Pulmonic Valve: The pulmonic valve was grossly normal. Pulmonic valve regurgitation is not visualized. No evidence of pulmonic stenosis. Aorta: The aortic root and ascending aorta are structurally normal, with no evidence of dilitation. IAS/Shunts: The atrial septum is  grossly normal. Additional Comments: 3D was performed not requiring image post processing on an independent workstation and was abnormal.  LEFT VENTRICLE PLAX 2D LVIDd:         3.90 cm         Diastology LVIDs:         3.00 cm         LV e' medial:    8.18 cm/s LV PW:         1.10 cm         LV E/e' medial:  10.8 LV IVS:        1.10 cm         LV e' lateral:   10.30 cm/s LVOT diam:     1.90 cm         LV E/e' lateral: 8.6 LV SV:         38 LV SV Index:   24              2D LVOT Area:     2.84 cm        Longitudinal                                Strain                                2D Strain GLS  -11.7 %                                (  A2C):                                2D Strain GLS  -12.1 %                                (A3C):                                2D Strain GLS  -10.6 %                                (A4C):                                2D Strain GLS  -11.5 %                                Avg:                                 3D Volume EF                                LV 3D EF:    Left                                             ventricul                                             ar                                             ejection                                             fraction                                             by 3D                                             volume is                                             44 %.  3D Volume EF:                                3D EF:        44 %                                LV EDV:       126 ml                                LV ESV:       71 ml                                LV SV:        55 ml RIGHT VENTRICLE RV Basal diam:  4.20 cm RV Mid diam:    3.50 cm RV S prime:     10.90 cm/s TAPSE (M-mode): 1.2 cm LEFT ATRIUM             Index        RIGHT ATRIUM           Index LA diam:        3.00 cm 1.86 cm/m   RA Area:     17.50 cm LA Vol (A2C):   23.1 ml 14.34 ml/m  RA Volume:   46.80 ml  29.04 ml/m LA Vol  (A4C):   35.9 ml 22.28 ml/m LA Biplane Vol: 29.6 ml 18.37 ml/m  AORTIC VALVE                    PULMONIC VALVE AV Area (Vmax):    1.95 cm     PV Vmax:          0.91 m/s AV Area (Vmean):   1.92 cm     PV Peak grad:     3.3 mmHg AV Area (VTI):     2.04 cm     PR End Diast Vel: 4.58 msec AV Vmax:           121.00 cm/s AV Vmean:          81.500 cm/s AV VTI:            0.188 m AV Peak Grad:      5.9 mmHg AV Mean Grad:      3.0 mmHg LVOT Vmax:         83.30 cm/s LVOT Vmean:        55.100 cm/s LVOT VTI:          0.135 m LVOT/AV VTI ratio: 0.72  AORTA Ao Root diam: 2.80 cm Ao Asc diam:  2.30 cm MITRAL VALVE               TRICUSPID VALVE MV Area (PHT): 5.88 cm    TR Peak grad:   24.4 mmHg MV Decel Time: 129 msec    TR Vmax:        247.00 cm/s MV E velocity: 88.50 cm/s MV A velocity: 77.00 cm/s  SHUNTS MV E/A ratio:  1.15        Systemic VTI:  0.14 m  Systemic Diam: 1.90 cm Riley Lam MD Electronically signed by Riley Lam MD Signature Date/Time: 07/19/2023/10:03:58 AM    Final    EEG adult Result Date: 07/19/2023 Charlsie Quest, MD     07/19/2023 10:09 AM Patient Name: Kirat Mezquita MRN: [id number] Epilepsy Attending: Charlsie Quest Referring Physician/Provider: Tim Lair, PA-C Date: 07/19/2023 Duration: 23 mins Patient history: 33yo F s/p cardiac arrest. EEG to evaluate for seizure Level of alertness: lethargic AEDs during EEG study: propofol Technical aspects: This EEG study was done with scalp electrodes positioned according to the 10-20 International system of electrode placement. Electrical activity was reviewed with band pass filter of 1-70Hz , sensitivity of 7 uV/mm, display speed of 36mm/sec with a 60Hz  notched filter applied as appropriate. EEG data were recorded continuously and digitally stored.  Video monitoring was available and reviewed as appropriate. Description: EEG showed continuous generalized polymorphic 5 to 6 Hz theta slowing  admixed with intermittent 2-3hz  delta slowing. Hyperventilation and photic stimulation were not performed.   ABNORMALITY - Continuous slow, generalized IMPRESSION: This study is suggestive of moderate diffuse encephalopathy. No seizures or epileptiform discharges were seen throughout the recording. Charlsie Quest   DG Abdomen 1 View Result Date: 07/19/2023 CLINICAL DATA:  Orogastric tube placement EXAM: ABDOMEN - 1 VIEW COMPARISON:  06/12/2023 FINDINGS: Nasogastric tube tip overlies the expected gastric antrum. The abdominal gas pattern is indeterminate due to a paucity of intra-abdominal gas. Loss of the properitoneal stripe suggests underlying ascites. IMPRESSION: 1. Nasogastric tube tip overlies the expected gastric antrum. 2. Indeterminate gas pattern. 3. Suspected ascites Electronically Signed   By: Helyn Numbers M.D.   On: 07/19/2023 02:23   DG CHEST PORT 1 VIEW Result Date: 07/19/2023 CLINICAL DATA:  Respiratory failure EXAM: PORTABLE CHEST 1 VIEW COMPARISON:  07/16/2023 FINDINGS: Endotracheal tube 16 mm above the carina. Nasogastric tube extends into the upper abdomen beyond margin of the examination. Lung volumes are small there is bibasilar atelectasis. Small right pleural effusion is present. No pneumothorax. Cardiac size within normal limits. Pulmonary vascularity is normal. No acute bone. IMPRESSION: 1. Support tubes in appropriate position. 2. Small lung volumes with bibasilar atelectasis. Small right pleural effusion. Electronically Signed   By: Helyn Numbers M.D.   On: 07/19/2023 02:22   CT Head Wo Contrast Result Date: 07/17/2023 CLINICAL DATA:  Initial evaluation for acute mental status change, unknown cause. EXAM: CT HEAD WITHOUT CONTRAST TECHNIQUE: Contiguous axial images were obtained from the base of the skull through the vertex without intravenous contrast. RADIATION DOSE REDUCTION: This exam was performed according to the departmental dose-optimization program which includes  automated exposure control, adjustment of the mA and/or kV according to patient size and/or use of iterative reconstruction technique. COMPARISON:  Prior MRI from 06/15/2023. FINDINGS: Brain: Cerebral volume within normal limits. No acute intracranial hemorrhage. No acute large vessel territory infarct. No mass lesion, mass effect, or midline shift. No hydrocephalus or extra-axial fluid collection. Vascular: No abnormal hyperdense vessel. Scattered calcified atherosclerosis present about the skull base, somewhat advanced for age. Skull: Scalp soft tissues within normal limits.  Calvarium intact. Sinuses/Orbits: Globes and orbital soft tissues within normal limits. Visualized paranasal sinuses are largely clear. Small to moderate right mastoid effusion noted. Left mastoid air cells are clear. Other: None. IMPRESSION: 1. No acute intracranial abnormality. 2. Small to moderate right mastoid effusion, of uncertain significance. Correlation with physical exam suggested. 3. Scattered calcified atherosclerosis about the skull base, advanced for age. Electronically Signed   By: Sharlet Salina  Phill Myron M.D.   On: 07/17/2023 20:38   DG Chest Portable 1 View Result Date: 07/16/2023 CLINICAL DATA:  Cough shortness of breath EXAM: PORTABLE CHEST 1 VIEW COMPARISON:  June 12, 2023 FINDINGS: The cardiomediastinal silhouette is unchanged and enlarged in contour. Small RIGHT pleural effusion. This is favored slightly increased compared to prior. No pneumothorax. Heterogeneous RIGHT basilar airspace opacity with additional scattered linear opacities bilaterally. IMPRESSION: Small RIGHT pleural effusion with favored adjacent atelectasis. Additional scattered areas of atelectasis. Superimposed infection could present similarly. Electronically Signed   By: Meda Klinefelter M.D.   On: 07/16/2023 16:57    Microbiology Recent Results (from the past 240 hours)  Resp panel by RT-PCR (RSV, Flu A&B, Covid) Anterior Nasal Swab      Status: None   Collection Time: 07/16/23 11:47 AM   Specimen: Anterior Nasal Swab  Result Value Ref Range Status   SARS Coronavirus 2 by RT PCR NEGATIVE NEGATIVE Final   Influenza A by PCR NEGATIVE NEGATIVE Final   Influenza B by PCR NEGATIVE NEGATIVE Final    Comment: (NOTE) The Xpert Xpress SARS-CoV-2/FLU/RSV plus assay is intended as an aid in the diagnosis of influenza from Nasopharyngeal swab specimens and should not be used as a sole basis for treatment. Nasal washings and aspirates are unacceptable for Xpert Xpress SARS-CoV-2/FLU/RSV testing.  Fact Sheet for Patients: BloggerCourse.com  Fact Sheet for Healthcare Providers: SeriousBroker.it  This test is not yet approved or cleared by the [place] FDA and has been authorized for detection and/or diagnosis of SARS-CoV-2 by FDA under an Emergency Use Authorization (EUA). This EUA will remain in effect (meaning this test can be used) for the duration of the COVID-19 declaration under Section 564(b)(1) of the Act, 21 U.S.C. section 360bbb-3(b)(1), unless the authorization is terminated or revoked.     Resp Syncytial Virus by PCR NEGATIVE NEGATIVE Final    Comment: (NOTE) Fact Sheet for Patients: BloggerCourse.com  Fact Sheet for Healthcare Providers: SeriousBroker.it  This test is not yet approved or cleared by the [place] FDA and has been authorized for detection and/or diagnosis of SARS-CoV-2 by FDA under an Emergency Use Authorization (EUA). This EUA will remain in effect (meaning this test can be used) for the duration of the COVID-19 declaration under Section 564(b)(1) of the Act, 21 U.S.C. section 360bbb-3(b)(1), unless the authorization is terminated or revoked.  Performed at [facility], [address], [place], [place] [address]   Culture, blood (routine x 2)     Status: None   Collection  Time: 07/16/23 11:54 AM   Specimen: BLOOD RIGHT HAND  Result Value Ref Range Status   Specimen Description BLOOD RIGHT HAND  Final   Special Requests   Final    BOTTLES DRAWN AEROBIC ONLY Blood Culture results may not be optimal due to an inadequate volume of blood received in culture bottles   Culture   Final    NO GROWTH 5 DAYS Performed at [facility], [address] [place], [place] [address]    Report Status 07/17/2023 FINAL  Final  Culture, blood (routine x 2)     Status: None   Collection Time: 07/16/23 11:59 AM   Specimen: BLOOD RIGHT WRIST  Result Value Ref Range Status   Specimen Description BLOOD RIGHT WRIST  Final   Special Requests   Final    BOTTLES DRAWN AEROBIC ONLY Blood Culture results may not be optimal due to an inadequate volume of blood received in culture bottles  Culture   Final    NO GROWTH 5 DAYS Performed at [facility], [address], [place], [place] [address]    Report Status 07/10/2023 FINAL  Final  MRSA Next Gen by PCR, Nasal     Status: None   Collection Time: 07/17/23  8:47 AM   Specimen: Nasal Mucosa; Nasal Swab  Result Value Ref Range Status   MRSA by PCR Next Gen NOT DETECTED NOT DETECTED Final    Comment: (NOTE) The GeneXpert MRSA Assay (FDA approved for NASAL specimens only), is one component of a comprehensive MRSA colonization surveillance program. It is not intended to diagnose MRSA infection nor to guide or monitor treatment for MRSA infections. Test performance is not FDA approved in patients less than 59 years old. Performed at [facility], [address] [place], [place] [address]   MRSA Next Gen by PCR, Nasal     Status: None   Collection Time: 07/19/23  1:56 AM   Specimen: Nasal Mucosa; Nasal Swab  Result Value Ref Range Status   MRSA by PCR Next Gen NOT DETECTED NOT DETECTED Final    Comment: (NOTE) The GeneXpert MRSA Assay (FDA approved for NASAL specimens only), is one component of a comprehensive  MRSA colonization surveillance program. It is not intended to diagnose MRSA infection nor to guide or monitor treatment for MRSA infections. Test performance is not FDA approved in patients less than 53 years old. Performed at [facility], [address] [place], [place] [address]     Lab Basic Metabolic Panel: Recent Labs  Lab 07/16/23 1206 07/16/23 1214 07/16/23 1404 07/17/23 0848 07/18/23 1408 07/19/23 0216 07/19/23 0218 07/19/23 0348 07/20/23 0447 07/20/23 0814  NA 130* 130*   < > 129* 124*  --  128* 128*  --  126*  K 5.1 5.2*   < > 3.6 4.3  --  4.3 4.0 4.1 4.1  CL 96* 100  --  90* 89*  --  90*  --   --  88*  CO2 21*  --   --  25 25  --  23  --   --  25  GLUCOSE 28* 25*  --  94 173*  --  148*  --   --  192*  BUN 37* 40*  --  19 31*  --  22*  --   --  33*  CREATININE 5.05* 5.50*  --  3.19* 4.09*  --  3.17*  --   --  3.67*  CALCIUM 7.8*  --   --  8.0* 7.5*  --  7.9*  --   --  7.6*  MG  --   --   --   --   --  1.8  --   --  2.2  --   PHOS  --   --   --  2.2* 3.6  --  3.4  --  3.5 3.5   < > = values in this interval not displayed.   Liver Function Tests: Recent Labs  Lab 07/16/23 1206 07/17/23 0848 07/18/23 1408 07/19/23 0215 07/19/23 0218 07/20/23 0814  AST 26 30  --  34  --   --   ALT 14 15  --  12  --   --   ALKPHOS 147* 141*  --  140*  --   --   BILITOT 1.4* 2.4*  --  3.0*  --   --   PROT 6.0* 6.4*  --  6.5  --   --   ALBUMIN 2.4* 2.3* 2.1* 2.4* 2.3* 2.0*   No results for input(s): "LIPASE", "AMYLASE" in the last 168 hours. No results for input(s): "AMMONIA" in the last 168 hours. CBC: Recent Labs  Lab 07/16/23 1206 07/16/23 1214 07/17/23 0848 07/18/23 1408 07/19/23 0216 07/19/23 0348 07/20/23 0447 07/20/23 0814  WBC 2.6*  --  3.4* 7.0 5.6  --  6.1 5.8  NEUTROABS 1.6*  --   --   --   --   --   --   --   HGB 12.1   < > 10.2* 9.3* 10.3* 11.6* 9.9* 9.3*  HCT 39.9   < > 31.1* 28.5* 32.3* 34.0* 30.3* 28.9*  MCV 95.0  --  88.9 89.6 91.0  --   88.1 88.7  PLT 101*  --  63* 44* 37*  --  35* 37*   < > = values in this interval not displayed.   Cardiac Enzymes: Recent Labs  Lab 07/16/23 1206  CKTOTAL 95   Sepsis Labs: Recent Labs  Lab 07/16/23 1401 07/17/23 0848 07/18/23 1408 07/19/23 0216 07/19/23 1016 07/19/23 1323 07/20/23 0447 07/20/23 0814  WBC  --    < > 7.0 5.6  --   --  6.1 5.8  LATICACIDVEN 1.6  --   --  3.4* 2.0* 1.8  --   --    < > = values in this interval not displayed.    Procedures/Operations  Endotracheal intubation Small bore NGT placement  Steffanie Dunn 07/12/2023, 5:42 PM

## 2023-07-30 NOTE — Progress Notes (Signed)
   Palliative Medicine Inpatient Follow Up Note   The Palliative Care team has observed the consultation for Ann Robertson.  I have spoken to Ann Robertson, APP for CCM who shares that GOC conversations have occurred. The plan is for compassionate extubation when additional family member(s) are able to come and say their goodbyes.  At this time there are no additional Palliative care needs therefore the consultation will be removed.  Please contact us if consultation/support needed in the near future.  No Charge  ______________________________________________________________________________________ Ann Robertson [place] [facility] Team Team Cell Phone: [phone] Please utilize secure chat with additional questions, if there is no response within 30 minutes please call the above phone number  Palliative Medicine Team providers are available by phone from 7am to 7pm daily and can be reached through the team cell phone.  Should this patient require assistance outside of these hours, please call the patient's attending physician.

## 2023-07-30 NOTE — Progress Notes (Signed)
Late Note Entry- Jul 22, 2023  Contacted [facility] to be advised of pt's passing.   Olivia Canter Renal Navigator [phone]

## 2023-07-30 NOTE — Progress Notes (Signed)
Assumed care of this patient from previous RN. Family is at bedside asking appropriate and supportive questions. Will continue to monitor.

## 2023-07-30 NOTE — Progress Notes (Signed)
NAME:  Ann Robertson, MRN:  [id number], DOB:  11/26/1990, LOS: 4 ADMISSION DATE:  07/16/2023 CONSULTATION DATE:  07/19/2023 REFERRING MD:  Nelson Chimes - TRH, CHIEF COMPLAINT:  Post-cardiac arrest   History of Present Illness:  33 year old woman who initially presented to [facility] 2/15 for AMS. PMHx significant for HTN, HLD, T1DM (c/b DKA), ESRD (on HD MWF via LUE AVF), anemia, seizure, HSV2.  Per chart review, patient's son called EMS when she began acting abnormally; GCS was noted to be 4 with CBG 32. Patient's son endorses that she has had poor PO intake. Glucagon was administered with improvement in GCS to 9 and CBG to 50. Continued to be hypoglycemic in ED requiring D50 amps x 2 and subsequent D10 gtt initiation. Additional concern for possible sepsis with empiric broad-spectrum antibiotics started (vanc/cefepime). Nephrology was consulted for continuation of HD.  On 2/18AM, patient was noted to be unresponsive and pulseless with initial rhythm PEA. Code Blue was called and CPR administered x 5 minutes with Epi x 2 and ROSC. Patient was intubated peri-code. She was subsequently transferred to [facility].  PCCM consulted for ICU transfer post-arrest.  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 Initial rhythm PEA. CPR x 5 minutes, Epi x 2 with ROSC.  Intubated peri-code. Transferred to 2H for further care, PCCM consulted for ICU management. 2/19 remains intubated and unresponsive on low-dose fentanyl and propofol.  Became profoundly hypotensive with intermittent HD, quickly corrected with peripheral pressors 2/20 remains comfortable on ventilator this a.m., awaiting family arrival for compassionate extubation  Interim History / Subjective:  Seen resting on vent this a.m.  Objective:  Blood pressure 106/68, pulse 63, temperature (!) 96.1 F (35.6 C), temperature source Axillary, resp. rate 20, height 5\' 5"  (1.651 m), weight 54.8 kg, SpO2 100%.    Vent Mode: PRVC FiO2 (%):  [40 %] 40 % Set Rate:  [20 bmp] 20 bmp Vt Set:  [400 mL] 400 mL PEEP:  [5 cmH20] 5 cmH20 Plateau Pressure:  [13 cmH20] 13 cmH20   Intake/Output Summary (Last 24 hours) at 07/13/2023 0850 Last data filed at 07/26/2023 0800 Gross per 24 hour  Intake 1050.9 ml  Output 2700 ml  Net -1649.1 ml   Filed Weights   07/19/23 0500 07/20/23 0500 07/20/23 0804  Weight: 56 kg 54.8 kg 54.8 kg   Physical Examination: General: Acute on chronic deconditioned frail cachectic female lying in bed on mechanical ventilation in no acute distress HEENT: ETT, MM pink/moist, PERRL,  Neuro: Sedated on vent CV: s1s2 regular rate and rhythm, no murmur, rubs, or gallops,  PULM: Clear to auscultation bilaterally, no increased work of breathing, no added breath sounds GI: soft, bowel sounds active in all 4 quadrants, non-tender, significant abdominal ascites Extremities: warm/dry, 2+ pitting edema tracking  up to ribs  Skin: no rashes or lesions   Resolved Hospital Problem List:    Assessment & Plan:  Post-cardiac arrest x 2 -2/17 unresponsive and pea arrest; ROSC 5 minutes Cardiogenic shock due to RV failure Acute RV failure -Echo 2/18 EF 45 to 50% with global hypokinesis and mild concentric left ventricular hypertrophy and signs of RV overload Acute hypoxic respiratory failure  requiring mechanical ventilation Cirrhosis, 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  Goals of care discussion held with family with recommendations made to transition to comfort care.  Family elected to slowly start transitioning with request of no further lab work, blood draws, for medication administration.  They were contacting family to allow for visitation prior to compassionate extubation.  A.m. 2/20 no family at bedside, patient's father Ann Needle  and relayed my recommendations for compassionate extubation today.  He stated he would contact family and present to the hospital so we can to discuss timing for transition to comfort care.   Best Practice: (right click and "Reselect all SmartList Selections" daily)   Diet/type: NPO; TF today if not extubated DVT prophylaxis: prophylactic heparin  GI prophylaxis: PPI Lines: N/A Foley:  N/A Code Status:  full code Last date of multidisciplinary goals of care discussion continue to update family daily  Critical care time:   CRITICAL CARE Performed by: Taurean Ju D. Robertson   Total critical care time: 37 minutes  Critical care time was exclusive of separately billable procedures and treating other patients.  Critical care was necessary to treat or prevent imminent or life-threatening deterioration.  Critical care was time spent personally by me on the following  activities: development of treatment plan with patient and/or surrogate as well as nursing, discussions with consultants, evaluation of patient's response to treatment, examination of patient, obtaining history from patient or surrogate, ordering and performing treatments and interventions, ordering and review of laboratory studies, ordering and review of radiographic studies, pulse oximetry and re-evaluation of patient's condition.  Ann Sorlie D. Harris, NP-C [facility] Personal contact information can be found on Amion  If no contact or response made please call [phone] 07/14/2023, 8:50 AM

## 2023-07-30 DEATH — deceased

## 2023-08-23 MED FILL — Medication: Qty: 1 | Status: AC

## 2023-09-28 ENCOUNTER — Other Ambulatory Visit (HOSPITAL_COMMUNITY): Payer: Self-pay
